# Patient Record
Sex: Female | Born: 1950
Health system: Southern US, Community
[De-identification: ages and names within clinical notes are randomized; demographics above are authoritative.]

## PROBLEM LIST (undated history)

## (undated) DIAGNOSIS — F419 Anxiety disorder, unspecified: Secondary | ICD-10-CM

## (undated) DIAGNOSIS — C7800 Secondary malignant neoplasm of unspecified lung: Secondary | ICD-10-CM

## (undated) DIAGNOSIS — Z720 Tobacco use: Secondary | ICD-10-CM

## (undated) DIAGNOSIS — G8929 Other chronic pain: Secondary | ICD-10-CM

## (undated) DIAGNOSIS — Z972 Presence of dental prosthetic device (complete) (partial): Secondary | ICD-10-CM

## (undated) DIAGNOSIS — C50919 Malignant neoplasm of unspecified site of unspecified female breast: Secondary | ICD-10-CM

## (undated) DIAGNOSIS — IMO0001 Reserved for inherently not codable concepts without codable children: Secondary | ICD-10-CM

## (undated) DIAGNOSIS — R002 Palpitations: Secondary | ICD-10-CM

## (undated) DIAGNOSIS — D649 Anemia, unspecified: Secondary | ICD-10-CM

## (undated) DIAGNOSIS — Q211 Atrial septal defect: Secondary | ICD-10-CM

## (undated) DIAGNOSIS — IMO0002 Reserved for concepts with insufficient information to code with codable children: Secondary | ICD-10-CM

## (undated) DIAGNOSIS — C541 Malignant neoplasm of endometrium: Secondary | ICD-10-CM

## (undated) DIAGNOSIS — J449 Chronic obstructive pulmonary disease, unspecified: Secondary | ICD-10-CM

## (undated) HISTORY — DX: Malignant neoplasm of unspecified site of unspecified female breast: C50.919

## (undated) HISTORY — DX: Reserved for concepts with insufficient information to code with codable children: IMO0002

## (undated) HISTORY — PX: OTHER SURGICAL HISTORY: SHX169

## (undated) HISTORY — PX: CHOLECYSTECTOMY: SHX55

## (undated) HISTORY — DX: Reserved for inherently not codable concepts without codable children: IMO0001

## (undated) HISTORY — DX: Anxiety disorder, unspecified: F41.9

## (undated) HISTORY — PX: TUBAL LIGATION: SHX77

## (undated) HISTORY — PX: BREAST BIOPSY: SHX20

## (undated) HISTORY — DX: Anemia, unspecified: D64.9

---

## 1994-07-31 DIAGNOSIS — Q211 Atrial septal defect, unspecified: Secondary | ICD-10-CM

## 1994-07-31 HISTORY — DX: Atrial septal defect, unspecified: Q21.10

## 1994-07-31 HISTORY — PX: ASD REPAIR, OSTIUM PRIMUM: SHX1194

## 1994-07-31 HISTORY — DX: Atrial septal defect: Q21.1

## 2001-01-11 ENCOUNTER — Emergency Department (HOSPITAL_COMMUNITY): Admission: EM | Admit: 2001-01-11 | Discharge: 2001-01-11 | Payer: Self-pay | Admitting: *Deleted

## 2001-01-11 ENCOUNTER — Encounter: Payer: Self-pay | Admitting: *Deleted

## 2001-01-13 ENCOUNTER — Encounter: Payer: Self-pay | Admitting: Internal Medicine

## 2001-01-13 ENCOUNTER — Emergency Department (HOSPITAL_COMMUNITY): Admission: EM | Admit: 2001-01-13 | Discharge: 2001-01-13 | Payer: Self-pay | Admitting: Internal Medicine

## 2001-08-01 ENCOUNTER — Emergency Department (HOSPITAL_COMMUNITY): Admission: EM | Admit: 2001-08-01 | Discharge: 2001-08-02 | Payer: Self-pay | Admitting: *Deleted

## 2001-09-18 ENCOUNTER — Encounter: Payer: Self-pay | Admitting: Family Medicine

## 2001-09-18 ENCOUNTER — Ambulatory Visit (HOSPITAL_COMMUNITY): Admission: RE | Admit: 2001-09-18 | Discharge: 2001-09-18 | Payer: Self-pay | Admitting: Family Medicine

## 2002-01-01 ENCOUNTER — Emergency Department (HOSPITAL_COMMUNITY): Admission: EM | Admit: 2002-01-01 | Discharge: 2002-01-01 | Payer: Self-pay | Admitting: *Deleted

## 2002-01-01 ENCOUNTER — Encounter: Payer: Self-pay | Admitting: *Deleted

## 2002-01-05 ENCOUNTER — Encounter: Payer: Self-pay | Admitting: Family Medicine

## 2002-01-05 ENCOUNTER — Ambulatory Visit (HOSPITAL_COMMUNITY): Admission: RE | Admit: 2002-01-05 | Discharge: 2002-01-05 | Payer: Self-pay | Admitting: Family Medicine

## 2002-01-17 ENCOUNTER — Ambulatory Visit (HOSPITAL_COMMUNITY): Admission: RE | Admit: 2002-01-17 | Discharge: 2002-01-17 | Payer: Self-pay | Admitting: Cardiology

## 2002-02-08 ENCOUNTER — Emergency Department (HOSPITAL_COMMUNITY): Admission: EM | Admit: 2002-02-08 | Discharge: 2002-02-08 | Payer: Self-pay | Admitting: *Deleted

## 2002-02-08 ENCOUNTER — Encounter: Payer: Self-pay | Admitting: *Deleted

## 2002-11-04 ENCOUNTER — Emergency Department (HOSPITAL_COMMUNITY): Admission: EM | Admit: 2002-11-04 | Discharge: 2002-11-04 | Payer: Self-pay | Admitting: *Deleted

## 2002-11-04 ENCOUNTER — Encounter: Payer: Self-pay | Admitting: *Deleted

## 2003-06-26 ENCOUNTER — Emergency Department (HOSPITAL_COMMUNITY): Admission: EM | Admit: 2003-06-26 | Discharge: 2003-06-26 | Payer: Self-pay | Admitting: *Deleted

## 2003-12-25 ENCOUNTER — Ambulatory Visit (HOSPITAL_COMMUNITY): Admission: RE | Admit: 2003-12-25 | Discharge: 2003-12-25 | Payer: Self-pay | Admitting: Family Medicine

## 2005-03-24 ENCOUNTER — Emergency Department (HOSPITAL_COMMUNITY): Admission: EM | Admit: 2005-03-24 | Discharge: 2005-03-24 | Payer: Self-pay | Admitting: Emergency Medicine

## 2005-08-25 ENCOUNTER — Ambulatory Visit (HOSPITAL_COMMUNITY): Admission: RE | Admit: 2005-08-25 | Discharge: 2005-08-25 | Payer: Self-pay | Admitting: Internal Medicine

## 2005-09-13 ENCOUNTER — Ambulatory Visit (HOSPITAL_COMMUNITY): Admission: RE | Admit: 2005-09-13 | Discharge: 2005-09-13 | Payer: Self-pay | Admitting: Internal Medicine

## 2007-07-08 ENCOUNTER — Emergency Department (HOSPITAL_COMMUNITY): Admission: EM | Admit: 2007-07-08 | Discharge: 2007-07-08 | Payer: Self-pay | Admitting: Emergency Medicine

## 2008-05-04 ENCOUNTER — Emergency Department (HOSPITAL_COMMUNITY): Admission: EM | Admit: 2008-05-04 | Discharge: 2008-05-04 | Payer: Self-pay | Admitting: Emergency Medicine

## 2008-12-13 ENCOUNTER — Emergency Department (HOSPITAL_COMMUNITY): Admission: EM | Admit: 2008-12-13 | Discharge: 2008-12-13 | Payer: Self-pay | Admitting: Emergency Medicine

## 2009-06-12 ENCOUNTER — Emergency Department (HOSPITAL_COMMUNITY): Admission: EM | Admit: 2009-06-12 | Discharge: 2009-06-12 | Payer: Self-pay | Admitting: Emergency Medicine

## 2009-09-22 ENCOUNTER — Emergency Department (HOSPITAL_COMMUNITY): Admission: EM | Admit: 2009-09-22 | Discharge: 2009-09-22 | Payer: Self-pay | Admitting: Emergency Medicine

## 2010-02-17 ENCOUNTER — Emergency Department (HOSPITAL_COMMUNITY): Admission: EM | Admit: 2010-02-17 | Discharge: 2010-02-17 | Payer: Self-pay | Admitting: Emergency Medicine

## 2010-04-07 ENCOUNTER — Emergency Department (HOSPITAL_COMMUNITY): Admission: EM | Admit: 2010-04-07 | Discharge: 2010-04-07 | Payer: Self-pay | Admitting: Emergency Medicine

## 2010-10-19 LAB — CBC
HCT: 39.9 % (ref 36.0–46.0)
MCHC: 31.9 g/dL (ref 30.0–36.0)
MCV: 73.6 fL — ABNORMAL LOW (ref 78.0–100.0)
Platelets: 251 10*3/uL (ref 150–400)
RDW: 14.3 % (ref 11.5–15.5)
WBC: 13.4 10*3/uL — ABNORMAL HIGH (ref 4.0–10.5)

## 2010-10-19 LAB — DIFFERENTIAL
Basophils Absolute: 0.1 10*3/uL (ref 0.0–0.1)
Eosinophils Relative: 2 % (ref 0–5)
Lymphs Abs: 4.5 10*3/uL — ABNORMAL HIGH (ref 0.7–4.0)
Monocytes Absolute: 0.7 10*3/uL (ref 0.1–1.0)
Monocytes Relative: 5 % (ref 3–12)
Neutro Abs: 7.8 10*3/uL — ABNORMAL HIGH (ref 1.7–7.7)

## 2010-10-19 LAB — BASIC METABOLIC PANEL
BUN: 8 mg/dL (ref 6–23)
CO2: 25 mEq/L (ref 19–32)
GFR calc Af Amer: >60 mL/min (ref >60)

## 2010-10-19 LAB — POCT CARDIAC MARKERS
Myoglobin, poc: 71.7 ng/mL (ref 12–200)
Troponin i, poc: <0.05 ng/mL (ref 0.00–0.09)

## 2010-10-31 ENCOUNTER — Other Ambulatory Visit (HOSPITAL_COMMUNITY): Payer: Self-pay | Admitting: *Deleted

## 2010-10-31 DIAGNOSIS — Z1231 Encounter for screening mammogram for malignant neoplasm of breast: Secondary | ICD-10-CM

## 2010-11-02 ENCOUNTER — Ambulatory Visit (HOSPITAL_COMMUNITY)
Admission: RE | Admit: 2010-11-02 | Discharge: 2010-11-02 | Disposition: A | Discharge status: Home / Self Care | Payer: Medicare Other | Source: Ambulatory Visit | Attending: Family Medicine | Admitting: Family Medicine

## 2010-11-02 DIAGNOSIS — Z1231 Encounter for screening mammogram for malignant neoplasm of breast: Secondary | ICD-10-CM | POA: Insufficient documentation

## 2010-11-08 LAB — CBC
MCHC: 32.9 g/dL (ref 30.0–36.0)
RBC: 5.22 MIL/uL — ABNORMAL HIGH (ref 3.87–5.11)

## 2010-11-08 LAB — DIFFERENTIAL
Basophils Absolute: 0.1 10*3/uL (ref 0.0–0.1)
Basophils Relative: 1 % (ref 0–1)
Eosinophils Absolute: 0.2 10*3/uL (ref 0.0–0.7)
Eosinophils Relative: 1 % (ref 0–5)
Lymphocytes Relative: 32 % (ref 12–46)
Monocytes Relative: 5 % (ref 3–12)
Neutrophils Relative %: 61 % (ref 43–77)

## 2010-11-08 LAB — BASIC METABOLIC PANEL
Calcium: 9.3 mg/dL (ref 8.4–10.5)
GFR calc Af Amer: >60 mL/min (ref >60)
Glucose, Bld: 143 mg/dL — ABNORMAL HIGH (ref 70–99)
Potassium: 3.3 mEq/L — ABNORMAL LOW (ref 3.5–5.1)
Sodium: 141 mEq/L (ref 135–145)

## 2010-11-08 LAB — POCT CARDIAC MARKERS
CKMB, poc: <1 ng/mL — ABNORMAL LOW (ref 1.0–8.0)
Myoglobin, poc: 53.6 ng/mL (ref 12–200)

## 2010-12-16 NOTE — Procedures (Signed)
[facility]  Patient:    Paula Navarro, Paula Navarro Visit Number: [id number] MRN: [id number]          Service Type: OUT Location: RAD Attending Physician:  Cain Sieve Dictated by:   Gravity Bing, M.D. Proc. Date: 01/17/02 Admit Date:  01/17/2002 Discharge Date: 01/17/2002                              Echocardiograms  REFERRING:  Butch Penny, M.D. and Latimer Bing, M.D.  CLINICAL DATA:  A 60 year old woman with chest pain and prior ASD repair.  1. Technically adequate echocardiographic study. 2. Normal left atrium, right atrium, and right ventricle. 3. Normal aortic, mitral, tricuspid, and pulmonic valves. 4. Doppler study demonstrated mild mitral regurgitation. 5. Normal internal dimension of the left ventricle, mild LVH.  Normal regional    and global LV systolic function. 6. Normal IVC. 7. Atrial septum appears intact.  No left to right nor right to left flow    demonstrated with Doppler. Dictated by:   Eastman Bing, M.D. Attending Physician:  Cain Sieve DD:  01/17/02 TD:  01/19/02 Job: [id number]

## 2010-12-16 NOTE — Procedures (Signed)
   NAME:  Paula Navarro, Paula Navarro                           ACCOUNT NO.:  [id number]   MEDICAL RECORD NO.:  [id number]                   PATIENT TYPE:  EMS   LOCATION:  ED                                   FACILITY:  [facility]   PHYSICIAN:  Edward L. Juanetta Gosling, M.D.             DATE OF BIRTH:  1951/06/02   DATE OF PROCEDURE:  11/04/2002  DATE OF DISCHARGE:  11/04/2002                                EKG INTERPRETATION   DATE AND TIME OF TEST:  November 04, 2002 at 2035.   FINDINGS:  The rhythm is sinus rhythm with rate of about 100.  There is  atrial enlargement which appears to be right atrial.  Poor R wave  progression across the precordium may indicate a previous anterior  myocardial infarction; clinical correlation is suggested.   IMPRESSION:  Abnormal electrocardiogram.                                               Edward L. Juanetta Gosling, M.D.    ELH/MEDQ  D:  11/05/2002  T:  11/06/2002  Job:  [id number]

## 2011-03-24 ENCOUNTER — Encounter: Payer: Self-pay | Admitting: *Deleted

## 2011-03-24 ENCOUNTER — Emergency Department (HOSPITAL_COMMUNITY)
Admission: EM | Admit: 2011-03-24 | Discharge: 2011-03-24 | Disposition: A | Discharge status: Home / Self Care | Payer: Medicare Other | Attending: Emergency Medicine | Admitting: Emergency Medicine

## 2011-03-24 DIAGNOSIS — R05 Cough: Secondary | ICD-10-CM | POA: Insufficient documentation

## 2011-03-24 DIAGNOSIS — R059 Cough, unspecified: Secondary | ICD-10-CM | POA: Insufficient documentation

## 2011-03-24 DIAGNOSIS — J069 Acute upper respiratory infection, unspecified: Secondary | ICD-10-CM | POA: Insufficient documentation

## 2011-03-24 DIAGNOSIS — F17200 Nicotine dependence, unspecified, uncomplicated: Secondary | ICD-10-CM | POA: Insufficient documentation

## 2011-03-24 MED ORDER — ALBUTEROL SULFATE (5 MG/ML) 0.5% IN NEBU
2.5000 mg | INHALATION_SOLUTION | Freq: Once | RESPIRATORY_TRACT | Status: AC
  Administered 2011-03-24: 2.5 mg via RESPIRATORY_TRACT
  Filled 2011-03-24: qty 0.5

## 2011-03-24 MED ORDER — AZITHROMYCIN 250 MG PO TABS: ORAL_TABLET | ORAL | Status: DC

## 2011-03-24 MED ORDER — ALBUTEROL SULFATE HFA 108 (90 BASE) MCG/ACT IN AERS: 2.0000 | INHALATION_SPRAY | RESPIRATORY_TRACT | Status: DC | PRN

## 2011-03-24 NOTE — ED Notes (Signed)
Pt c/o cough, congestion, left neck pain that is worse with coughing or movement. Pt also c/o burning in her chest and throat when she coughs. C/o nonproductive dry cough.

## 2011-03-24 NOTE — ED Notes (Signed)
Pt a/ox4. Resp even and unlabored. NAD at this time. D/C instructions reviewed with pt. Pt verbalized understanding. Pt ambulated with steady gate to POV. 

## 2011-03-24 NOTE — ED Provider Notes (Signed)
History     CSN: [id number] Arrival date & time: 03/24/2011 11:05 AM  Chief Complaint  Patient presents with  . Nasal Congestion   Patient is a 60 y.o. female presenting with URI. The history is provided by the patient.  URI The primary symptoms include cough and wheezing. Primary symptoms do not include fever, fatigue, headaches, ear pain, sore throat, abdominal pain, nausea, vomiting, myalgias or rash. The current episode started 3 to 5 days ago. This is a recurrent problem. The problem has been gradually worsening.  The cough began today. The cough is non-productive.  Wheezing began today. The wheezing has been unchanged since its onset. The patient's medical history is significant for COPD and chronic lung disease.  Symptoms associated with the illness include congestion and rhinorrhea. The illness is not associated with chills, facial pain or sinus pressure. The following treatments were addressed: A decongestant was ineffective.    History reviewed. No pertinent past medical history.  Past Surgical History  Procedure Date  . Cardiac surgery     Pt states that she had a hole in her heart that didn't close right.   . Cholecystectomy     History reviewed. No pertinent family history.  History  Substance Use Topics  . Smoking status: Current Everyday Smoker    Types: Cigarettes  . Smokeless tobacco: Not on file  . Alcohol Use: No    OB History    Grav Para Term Preterm Abortions TAB SAB Ect Mult Living                  Review of Systems  Constitutional: Negative for fever, chills, activity change and fatigue.  HENT: Positive for congestion, rhinorrhea and sneezing. Negative for ear pain, sore throat, facial swelling, trouble swallowing, neck pain, neck stiffness and sinus pressure.   Respiratory: Positive for cough and wheezing. Negative for chest tightness and shortness of breath.   Cardiovascular: Negative.   Gastrointestinal: Negative for nausea, vomiting and  abdominal pain.  Musculoskeletal: Negative for myalgias.  Skin: Negative for rash.  Neurological: Negative for headaches.  All other systems reviewed and are negative.    Physical Exam  BP 122/77  Pulse 85  Temp(Src) 98 F (36.7 C) (Oral)  Resp 18  Ht 5\' 4"  (1.626 m)  Wt 136 lb (61.689 kg)  BMI 23.34 kg/m2  SpO2 99%  Physical Exam  Nursing note and vitals reviewed. Constitutional: She is oriented to person, place, and time. She appears well-developed and well-nourished. No distress.  HENT:  Head: Normocephalic and atraumatic.  Right Ear: External ear normal.  Left Ear: External ear normal.  Mouth/Throat: Oropharynx is clear and moist.  Eyes: EOM are normal.  Neck: Normal range of motion. Neck supple. No thyromegaly present.  Cardiovascular: Normal rate, regular rhythm and normal heart sounds.   Pulmonary/Chest: No accessory muscle usage. No respiratory distress. She has wheezes. She has no rales. She exhibits no tenderness.  Abdominal: Soft. There is no tenderness.  Musculoskeletal: She exhibits no edema and no tenderness.  Lymphadenopathy:    She has no cervical adenopathy.  Neurological: She is alert and oriented to person, place, and time. She exhibits normal muscle tone. Coordination normal.  Skin: Skin is warm and dry.    ED Course  Procedures  MDM   1259  Patient is ambulatory, NAD.  Expiratory wheezing throughout.  Vitals stable.   1340  Patient feels better, lungs sounds improved after neb.  Vitals and nursing notes werre reviewed,  Pt feels improved after observation and/or treatment in ED.   The patient appears reasonably screened and/or stabilized for discharge and I doubt any other medical condition or other [facility] requiring further screening, evaluation, or treatment in the ED at this time prior to discharge.       Moorea Boissonneault L. [place], [place] 03/30/11 1651

## 2011-03-24 NOTE — ED Notes (Signed)
RT notified of order for breathing treatment

## 2011-04-05 NOTE — ED Provider Notes (Signed)
Medical screening examination/treatment/procedure(s) were conducted as a shared visit with non-physician practitioner(s) and myself.  I personally evaluated the patient during the encounter  Toy Baker, MD 04/05/11 2332

## 2011-05-25 ENCOUNTER — Emergency Department (HOSPITAL_COMMUNITY)
Admission: EM | Admit: 2011-05-25 | Discharge: 2011-05-25 | Disposition: A | Discharge status: Home / Self Care | Payer: Medicare Other | Attending: Emergency Medicine | Admitting: Emergency Medicine

## 2011-05-25 ENCOUNTER — Encounter (HOSPITAL_COMMUNITY): Payer: Self-pay | Admitting: *Deleted

## 2011-05-25 DIAGNOSIS — J40 Bronchitis, not specified as acute or chronic: Secondary | ICD-10-CM

## 2011-05-25 DIAGNOSIS — F17200 Nicotine dependence, unspecified, uncomplicated: Secondary | ICD-10-CM | POA: Insufficient documentation

## 2011-05-25 DIAGNOSIS — R062 Wheezing: Secondary | ICD-10-CM | POA: Insufficient documentation

## 2011-05-25 DIAGNOSIS — R0602 Shortness of breath: Secondary | ICD-10-CM | POA: Insufficient documentation

## 2011-05-25 DIAGNOSIS — M545 Low back pain, unspecified: Secondary | ICD-10-CM | POA: Insufficient documentation

## 2011-05-25 DIAGNOSIS — Z79899 Other long term (current) drug therapy: Secondary | ICD-10-CM | POA: Insufficient documentation

## 2011-05-25 MED ORDER — ALBUTEROL SULFATE HFA 108 (90 BASE) MCG/ACT IN AERS: 2.0000 | INHALATION_SPRAY | RESPIRATORY_TRACT | Status: DC

## 2011-05-25 MED ORDER — HYDROCOD POLST-CHLORPHEN POLST 10-8 MG/5ML PO LQCR
5.0000 mL | Freq: Once | ORAL | Status: DC
  Filled 2011-05-25: qty 5

## 2011-05-25 MED ORDER — PREDNISONE 20 MG PO TABS
60.0000 mg | ORAL_TABLET | Freq: Every day | ORAL | Status: DC
  Administered 2011-05-25: 60 mg via ORAL
  Filled 2011-05-25: qty 3

## 2011-05-25 MED ORDER — PREDNISONE 10 MG PO TABS: ORAL_TABLET | ORAL | Status: DC

## 2011-05-25 MED ORDER — ALBUTEROL SULFATE HFA 108 (90 BASE) MCG/ACT IN AERS
INHALATION_SPRAY | RESPIRATORY_TRACT | Status: AC
  Filled 2011-05-25: qty 6.7
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
## 2011-05-26 NOTE — ED Provider Notes (Signed)
Evaluation and management procedures were performed by the PA/NP under my supervision/collaboration.    Felisa Bonier, MD 05/26/11 2122

## 2011-08-22 ENCOUNTER — Encounter (HOSPITAL_COMMUNITY): Payer: Self-pay | Admitting: Emergency Medicine

## 2011-08-22 ENCOUNTER — Emergency Department (HOSPITAL_COMMUNITY)
Admission: EM | Admit: 2011-08-22 | Discharge: 2011-08-22 | Disposition: A | Discharge status: Home / Self Care | Payer: Medicare Other | Attending: Emergency Medicine | Admitting: Emergency Medicine

## 2011-08-22 DIAGNOSIS — J40 Bronchitis, not specified as acute or chronic: Secondary | ICD-10-CM | POA: Insufficient documentation

## 2011-08-22 DIAGNOSIS — F17200 Nicotine dependence, unspecified, uncomplicated: Secondary | ICD-10-CM | POA: Insufficient documentation

## 2011-08-22 MED ORDER — METHYLPREDNISOLONE SODIUM SUCC 125 MG IJ SOLR
125.0000 mg | Freq: Once | INTRAMUSCULAR | Status: AC
  Administered 2011-08-22: 125 mg via INTRAMUSCULAR
  Filled 2011-08-22: qty 2

## 2011-08-22 MED ORDER — AZITHROMYCIN 250 MG PO TABS: 250.0000 mg | ORAL_TABLET | Freq: Every day | ORAL | Status: DC

## 2011-08-22 MED ORDER — PROMETHAZINE-DM 6.25-15 MG/5ML PO SYRP: 5.0000 mL | ORAL_SOLUTION | ORAL | Status: AC | PRN

## 2011-08-22 MED ORDER — IPRATROPIUM BROMIDE 0.02 % IN SOLN
0.5000 mg | Freq: Once | RESPIRATORY_TRACT | Status: AC
  Administered 2011-08-22: 0.5 mg via RESPIRATORY_TRACT
  Filled 2011-08-22: qty 2.5

## 2011-08-22 MED ORDER — AZITHROMYCIN 250 MG PO TABS
500.0000 mg | ORAL_TABLET | Freq: Once | ORAL | Status: AC
  Administered 2011-08-22: 500 mg via ORAL
  Filled 2011-08-22: qty 2

## 2011-08-22 MED ORDER — AZITHROMYCIN 250 MG PO TABS: ORAL_TABLET | ORAL | Status: DC

## 2011-08-22 MED ORDER — ALBUTEROL SULFATE (5 MG/ML) 0.5% IN NEBU
2.5000 mg | INHALATION_SOLUTION | Freq: Once | RESPIRATORY_TRACT | Status: AC
  Administered 2011-08-22: 2.5 mg via RESPIRATORY_TRACT
  Filled 2011-08-22: qty 0.5

## 2011-08-22 NOTE — ED Notes (Signed)
Sprite given at patient's request. 

## 2011-08-22 NOTE — ED Notes (Signed)
Pt c/o coughing and sob since Thursday.

## 2011-08-22 NOTE — ED Notes (Signed)
RT paged for breathing tx

## 2011-08-22 NOTE — ED Provider Notes (Signed)
History     CSN: [id number]  Arrival date & time 08/22/11  4098   First MD Initiated Contact with Patient 08/22/11 [id number]      Chief Complaint  Patient presents with  . Cough  . Shortness of Breath    (Consider location/radiation/quality/duration/timing/severity/associated sxs/prior treatment) Patient is a 61 y.o. female presenting with cough and shortness of breath. The history is provided by the patient.  Cough The current episode started more than 2 days ago. The problem occurs hourly. The problem has not changed since onset.The cough is productive of sputum. There has been no fever. Associated symptoms include chills, sweats, myalgias and shortness of breath. Pertinent negatives include no chest pain and no wheezing. She has tried cough syrup for the symptoms. The treatment provided no relief. She is a smoker. Her past medical history is significant for bronchitis.  Shortness of Breath  Associated symptoms include cough and shortness of breath. Pertinent negatives include no chest pain and no wheezing.    Past Medical History  Diagnosis Date  . Bronchitis     Past Surgical History  Procedure Date  . Cardiac surgery     Pt states that she had a hole in her heart that didn't close right.   . Cholecystectomy     No family history on file.  History  Substance Use Topics  . Smoking status: Current Everyday Smoker    Types: Cigarettes  . Smokeless tobacco: Not on file  . Alcohol Use: No    OB History    Grav Para Term Preterm Abortions TAB SAB Ect Mult Living                  Review of Systems  Constitutional: Positive for chills. Negative for activity change.       All ROS Neg except as noted in HPI  HENT: Negative for nosebleeds and neck pain.   Eyes: Negative for photophobia and discharge.  Respiratory: Positive for cough and shortness of breath. Negative for wheezing.   Cardiovascular: Negative for chest pain and palpitations.  Gastrointestinal: Negative for  abdominal pain and blood in stool.  Genitourinary: Negative for dysuria, frequency and hematuria.  Musculoskeletal: Positive for myalgias. Negative for back pain and arthralgias.  Skin: Negative.   Neurological: Negative for dizziness, seizures and speech difficulty.  Psychiatric/Behavioral: Negative for hallucinations and confusion.    Allergies  Codeine  Home Medications   Current Outpatient Rx  Name Route Sig Dispense Refill  . ALBUTEROL SULFATE HFA 108 (90 BASE) MCG/ACT IN AERS Inhalation Inhale 2 puffs into the lungs every 4 (four) hours as needed for wheezing. 1 Inhaler 0  . HYDROCODONE-HOMATROPINE 5-1.5 MG/5ML PO SYRP Oral Take 5 mLs by mouth daily as needed. For cough (Bronchitis)    . PREDNISONE 10 MG PO TABS Oral Take 10 mg by mouth daily. 6,5,4,3,2,1 - take with food      BP 148/73  Pulse 66  Temp(Src) 97.8 F (36.6 C) (Oral)  Resp 18  Ht 5\' 4"  (1.626 m)  Wt 132 lb (59.875 kg)  BMI 22.66 kg/m2  SpO2 95%  LMP 05/20/2011  Physical Exam  Nursing note and vitals reviewed. Constitutional: She is oriented to person, place, and time. She appears well-developed and well-nourished.  Non-toxic appearance.  HENT:  Head: Normocephalic.  Right Ear: Tympanic membrane and external ear normal.  Left Ear: Tympanic membrane and external ear normal.       Moderate nasal congestion.  Eyes: EOM and lids are normal.  Pupils are equal, round, and reactive to light.  Neck: Normal range of motion. Neck supple. Carotid bruit is not present.  Cardiovascular: Normal rate, regular rhythm, normal heart sounds, intact distal pulses and normal pulses.   Pulmonary/Chest: No respiratory distress. She has wheezes. She has rhonchi.  Abdominal: Soft. Bowel sounds are normal. There is no tenderness. There is no guarding.  Musculoskeletal: Normal range of motion.  Lymphadenopathy:       Head (right side): No submandibular adenopathy present.       Head (left side): No submandibular adenopathy  present.    She has no cervical adenopathy.  Neurological: She is alert and oriented to person, place, and time. She has normal strength. No cranial nerve deficit or sensory deficit.  Skin: Skin is warm and dry.  Psychiatric: She has a normal mood and affect. Her speech is normal.    ED Course  Procedures (including critical care time)  Labs Reviewed - No data to display No results found.   Dx: Bronchitis.    MDM  I have reviewed nursing notes, vital signs, and all appropriate lab and imaging results for this patient. Plan discussed with pt. Need to take medications as suggested and stop smoking discussed. Albuterol neb tx, Zithromax, and solumedrol given in ED. Rx for zithromax and promethazine DM given. Pt has prednisone and albuterol inhaler.       Kathie Dike, [place] 08/22/11 1022

## 2011-08-22 NOTE — ED Notes (Signed)
Pt currently receiving breathing tx

## 2011-08-23 NOTE — ED Provider Notes (Signed)
Medical screening examination/treatment/procedure(s) were performed by non-physician practitioner and as supervising physician I was immediately available for consultation/collaboration.    Tamarcus Condie L Jerilee Space, MD 08/23/11 0708 

## 2011-09-30 ENCOUNTER — Encounter (HOSPITAL_COMMUNITY): Payer: Self-pay | Admitting: *Deleted

## 2011-09-30 ENCOUNTER — Emergency Department (HOSPITAL_COMMUNITY)
Admission: EM | Admit: 2011-09-30 | Discharge: 2011-09-30 | Discharge status: Against Medical Advice | Payer: Medicare Other | Attending: Emergency Medicine | Admitting: Emergency Medicine

## 2011-09-30 DIAGNOSIS — H9319 Tinnitus, unspecified ear: Secondary | ICD-10-CM | POA: Insufficient documentation

## 2011-09-30 NOTE — ED Notes (Addendum)
Pt presents with bilateral ear ringing. X 2 days. No drainage noted. NAD at this time. Will continue to monitor.

## 2011-09-30 NOTE — ED Notes (Signed)
Pt states ringing in ears since last night. NAD.

## 2011-09-30 NOTE — ED Notes (Signed)
Pt LWBS. Pt refused to sign AMA but stated "I have to take care of my momma. I cant be up here all day." Pt then ambulated to registration desk and told staff members the same thing and then walked out front door.

## 2011-10-12 ENCOUNTER — Other Ambulatory Visit (HOSPITAL_COMMUNITY): Payer: Self-pay | Admitting: Family Medicine

## 2011-10-12 DIAGNOSIS — Z139 Encounter for screening, unspecified: Secondary | ICD-10-CM

## 2011-10-30 ENCOUNTER — Observation Stay (HOSPITAL_COMMUNITY)
Admission: EM | Admit: 2011-10-30 | Discharge: 2011-11-01 | Disposition: A | Discharge status: Home / Self Care | Payer: Medicare Other | Attending: Family Medicine | Admitting: Family Medicine

## 2011-10-30 ENCOUNTER — Encounter (HOSPITAL_COMMUNITY): Payer: Self-pay | Admitting: *Deleted

## 2011-10-30 ENCOUNTER — Other Ambulatory Visit: Payer: Self-pay

## 2011-10-30 ENCOUNTER — Emergency Department (HOSPITAL_COMMUNITY): Payer: Medicare Other

## 2011-10-30 DIAGNOSIS — Q211 Atrial septal defect: Secondary | ICD-10-CM

## 2011-10-30 DIAGNOSIS — Z79899 Other long term (current) drug therapy: Secondary | ICD-10-CM | POA: Insufficient documentation

## 2011-10-30 DIAGNOSIS — N6320 Unspecified lump in the left breast, unspecified quadrant: Secondary | ICD-10-CM

## 2011-10-30 DIAGNOSIS — R0602 Shortness of breath: Secondary | ICD-10-CM | POA: Insufficient documentation

## 2011-10-30 DIAGNOSIS — I2510 Atherosclerotic heart disease of native coronary artery without angina pectoris: Secondary | ICD-10-CM | POA: Diagnosis present

## 2011-10-30 DIAGNOSIS — J40 Bronchitis, not specified as acute or chronic: Secondary | ICD-10-CM

## 2011-10-30 DIAGNOSIS — F17200 Nicotine dependence, unspecified, uncomplicated: Secondary | ICD-10-CM | POA: Insufficient documentation

## 2011-10-30 DIAGNOSIS — J209 Acute bronchitis, unspecified: Secondary | ICD-10-CM | POA: Diagnosis present

## 2011-10-30 DIAGNOSIS — R079 Chest pain, unspecified: Principal | ICD-10-CM | POA: Diagnosis present

## 2011-10-30 HISTORY — DX: Atrial septal defect: Q21.1

## 2011-10-30 HISTORY — DX: Palpitations: R00.2

## 2011-10-30 HISTORY — DX: Tobacco use: Z72.0

## 2011-10-30 LAB — DIFFERENTIAL
Basophils Absolute: 0.1 10*3/uL (ref 0.0–0.1)
Basophils Relative: 0 % (ref 0–1)
Eosinophils Relative: 2 % (ref 0–5)
Lymphocytes Relative: 36 % (ref 12–46)
Monocytes Absolute: 0.5 10*3/uL (ref 0.1–1.0)
Neutro Abs: 6.9 10*3/uL (ref 1.7–7.7)

## 2011-10-30 LAB — CBC
MCHC: 32.6 g/dL (ref 30.0–36.0)
MCV: 70.9 fL — ABNORMAL LOW (ref 78.0–100.0)
Platelets: 258 10*3/uL (ref 150–400)
RDW: 15.2 % (ref 11.5–15.5)
WBC: 12.1 10*3/uL — ABNORMAL HIGH (ref 4.0–10.5)

## 2011-10-30 LAB — BASIC METABOLIC PANEL
CO2: 29 mEq/L (ref 19–32)
Calcium: 10.1 mg/dL (ref 8.4–10.5)
Creatinine, Ser: 0.76 mg/dL (ref 0.50–1.10)
GFR calc Af Amer: >90 mL/min (ref >90)
Sodium: 140 mEq/L (ref 135–145)

## 2011-10-30 LAB — TROPONIN I: Troponin I: <0.3 ng/mL (ref <0.30)

## 2011-10-30 MED ORDER — BENZONATATE 100 MG PO CAPS
100.0000 mg | ORAL_CAPSULE | Freq: Three times a day (TID) | ORAL | Status: DC | PRN
  Administered 2011-10-30 – 2011-10-31 (×2): 100 mg via ORAL
  Filled 2011-10-30 (×2): qty 1

## 2011-10-30 MED ORDER — SODIUM CHLORIDE 0.9 % IV SOLN: INTRAVENOUS | Status: AC

## 2011-10-30 MED ORDER — ASPIRIN 81 MG PO CHEW
324.0000 mg | CHEWABLE_TABLET | Freq: Once | ORAL | Status: AC
  Administered 2011-10-30: 324 mg via ORAL
  Filled 2011-10-30: qty 4

## 2011-10-30 MED ORDER — LEVOFLOXACIN 500 MG PO TABS
750.0000 mg | ORAL_TABLET | Freq: Every day | ORAL | Status: DC
  Administered 2011-10-31 – 2011-11-01 (×2): 750 mg via ORAL
  Filled 2011-10-30 (×2): qty 1

## 2011-10-30 MED ORDER — LEVALBUTEROL TARTRATE 45 MCG/ACT IN AERO
1.0000 | INHALATION_SPRAY | Freq: Four times a day (QID) | RESPIRATORY_TRACT | Status: DC | PRN
  Filled 2011-10-30: qty 15

## 2011-10-30 MED ORDER — LEVOFLOXACIN 500 MG PO TABS
750.0000 mg | ORAL_TABLET | Freq: Once | ORAL | Status: AC
  Administered 2011-10-30: 750 mg via ORAL
  Filled 2011-10-30: qty 1

## 2011-10-30 MED ORDER — ACETAMINOPHEN 500 MG PO TABS: 500.0000 mg | ORAL_TABLET | ORAL | Status: DC | PRN

## 2011-10-30 MED ORDER — LEVALBUTEROL TARTRATE 45 MCG/ACT IN AERO
2.0000 | INHALATION_SPRAY | Freq: Four times a day (QID) | RESPIRATORY_TRACT | Status: DC | PRN
  Filled 2011-10-30: qty 15

## 2011-10-30 NOTE — ED Notes (Addendum)
Pt presents to er with mid center sharp chest pain that occurred after she has having a coughing "spell" pt states that she has been sick with congestion, cough since oct 25,  has been seen in er for several times and by pcp without improvement, pt states that she has been sob and experiencing generalized weakness since she has been sick. Pt arrives to er pain free, pt states that the cough is productive with sputum that changed color from yellow to white. Pt did use her inhaler prior to coming to er with improvement in sob but does state that the inhaler makes her heart feel like it is beating "funny"

## 2011-10-30 NOTE — H&P (Signed)
NAMEGRACIN, Paula Navarro                 ACCOUNT NO.:  [id number]  MEDICAL RECORD NO.:  [id number]  LOCATION:  [facility]                         FACILITY:  [facility]  PHYSICIAN:  Mila Homer. Sudie Bailey, M.D.DATE OF BIRTH:  October 05, 1950  DATE OF ADMISSION:  10/30/2011 DATE OF DISCHARGE:  LH                             HISTORY & PHYSICAL   This 61 year old woman has had bouts of coughing off and on over the last 6 months.  She presents to the emergency room today because she developed fairly severe anterior chest pain for about 30 minutes, which came after another bout of coughing.  She does give a history of having cardiac surgery done by Dr. Tyrone Sage at [facility] in 1996, and says that she had a "hole in the heart."  She also had palpitations at that time, and thinks that 1 of the reasons for the surgery was the palpitations.  She is followed by Dr. Renard Matter her local medical doctor.  She has been on albuterol in the past, but gets palpitations with this.  She lives with her mother, who is now age 59.  She smokes 1-1/2 packs of cigarettes a day and has smoked for years.  She has had no GI or GU symptoms.  Really her major problem has been coughing with some shortness of breath and then the chest pain as mentioned, but with no radiation.  PHYSICAL EXAM:  Exam in her room showed a pleasant, middle-aged woman who is in no acute distress.  Her pain has cleared.  Temp is 97.6, pulse 61, respiratory rate 20, blood pressure 135/85.  Her speech was normal. Sentence structure intact.  She is able to walk in the room without shortness of breath, but did have coughing. LUNGS:  Appeared to be clear throughout, but there were decreased breath sounds. HEART:  Her heart had a regular rhythm, rate of about 80, without murmurs.  There was a surgical scar in the anterior chest. ABDOMEN:  Soft without organomegaly, mass or tenderness. EXTREMITIES:  She had no edema of the ankles.  Her admission  white cell count was 12,100, of which 57% were neutrophils, 36 lymphs.  Hemoglobin was 13.4.  BMP was normal except for glucose mildly elevated at 118 and troponin was 0.30.  Chest x-ray showed chronic lung markings and slight tortuosity of the aorta.  Twelve lead EKG showed poor R-wave progression across the precordium.  She is in a sinus rhythm.  ADMISSION DIAGNOSES: 1. Bronchitis. 2. Chest pain, which could be of a number of causes. 3. Tobacco use disorder. 4. Status post cardiac surgery. She is being admitted to the hospital.  I have not given her a nicotine patch due to the possibility she has coronary disease causing the chest pain.  She is on levofloxacin 750 mg daily with Xopenex 1 puff q.6 hours to see if this helps with coughing.  Since there is a question of hydrocodone sensitivity I am putting her on Tessalon Perles.  She will be seen by her LMD, Dr. Renard Matter, tomorrow and by Cardiology as well.  I have emphasized to her that I think the most important thing to be done this admission is  to stop cigarette smoking for once and for all. We discussed psychological versus physical addiction, the use of a patch, the use of Chantix, psychological techniques to help her get off the cigarettes, etc.     Mila Homer. Sudie Bailey, M.D.     SDK/MEDQ  D:  10/30/2011  T:  10/30/2011  Job:  [id number]

## 2011-10-30 NOTE — ED Notes (Signed)
Pt refusing iv, states that she is unsure if she will stay and  Be admitted or not, advised that she would let me know when her family gets here.

## 2011-10-30 NOTE — ED Notes (Signed)
Pt requesting to wait until she is seen by EDP before having iv and blood work done.

## 2011-10-30 NOTE — ED Notes (Signed)
Pt thinks that she will stay now,

## 2011-10-30 NOTE — ED Provider Notes (Signed)
History   This chart was scribed for Paula Quarry, MD by Brooks Sailors. The patient was seen in room APA02/APA02. Patient's care was started at 1144.   CSN: [id number]  Arrival date & time 10/30/11  1144   First MD Initiated Contact with Patient 10/30/11 1335      Chief Complaint  Patient presents with  . Cough  . Chest Pain    (Consider location/radiation/quality/duration/timing/severity/associated sxs/prior treatment) HPI  LORRAINA Navarro is a 61 y.o. female who presents to the Emergency Department complaining of constant moderate productive cough and episode of chest pain onset this morning with associated SOB. The episode of pain was described as sharp and lasted about thirty minutes localized to the center of the chest, and is aggravated by coughing and that the pain did not radiate anywhere. Patient is a smoker with a history of cardiac surgery. Denies fever, family history of heart problems.     Past Medical History  Diagnosis Date  . Bronchitis   . Palpitation     Past Surgical History  Procedure Date  . Cardiac surgery     Pt states that she had a hole in her heart that didn't close right.   . Cholecystectomy     No family history on file.  History  Substance Use Topics  . Smoking status: Current Everyday Smoker    Types: Cigarettes  . Smokeless tobacco: Not on file  . Alcohol Use: No    OB History    Grav Para Term Preterm Abortions TAB SAB Ect Mult Living                  Review of Systems A complete 10 system review of systems was obtained and all systems are negative except as noted in the HPI and PMH.   Allergies  Codeine  Home Medications   Current Outpatient Rx  Name Route Sig Dispense Refill  . ALBUTEROL SULFATE HFA 108 (90 BASE) MCG/ACT IN AERS Inhalation Inhale 2 puffs into the lungs every 4 (four) hours as needed for wheezing. 1 Inhaler 0  . AZITHROMYCIN 250 MG PO TABS  Take  1 every day until finished, please start Jan 22. 4 tablet 0   . HYDROCODONE-HOMATROPINE 5-1.5 MG/5ML PO SYRP Oral Take 5 mLs by mouth daily as needed. For cough (Bronchitis)    . PREDNISONE 10 MG PO TABS Oral Take 10 mg by mouth daily. 6,5,4,3,2,1 - take with food      BP 146/79  Pulse 79  Temp 98.8 F (37.1 C)  Resp 20  Ht 5\' 4"  (1.626 m)  Wt 137 lb 5 oz (62.285 kg)  BMI 23.57 kg/m2  SpO2 98%  LMP 05/20/2011  Physical Exam  Nursing note and vitals reviewed. Constitutional: She is oriented to person, place, and time. She appears well-developed and well-nourished. No distress.  HENT:  Head: Normocephalic and atraumatic.  Eyes: EOM are normal. Pupils are equal, round, and reactive to light.  Neck: Neck supple. No tracheal deviation present.  Cardiovascular: Normal rate.   Pulmonary/Chest: Effort normal and breath sounds normal. No respiratory distress. Wheezes: mild expiratory        Coarse rhonchi bilaterally. Sternal scar.  Abdominal: Soft. She exhibits no distension.       Cesarean scar. LUQ scar consistent with cholecystectomy.   Musculoskeletal: Normal range of motion. She exhibits no edema.  Neurological: She is alert and oriented to person, place, and time. No sensory deficit.  Skin: Skin is warm  and dry.  Psychiatric: She has a normal mood and affect. Her behavior is normal.    ED Course  Procedures (including critical care time) DIAGNOSTIC STUDIES: Oxygen Saturation is 98% on room air, normal by my interpretation.    COORDINATION OF CARE: 2:04PM- Patient informed of current plan for treatment and evaluation and agrees with plan at this time.      Labs Reviewed  CBC  DIFFERENTIAL  BASIC METABOLIC PANEL   Results for orders placed during the hospital encounter of 10/30/11  CBC      Component Value Range   WBC 12.1 (*) 4.0 - 10.5 (K/uL)   RBC 5.80 (*) 3.87 - 5.11 (MIL/uL)   Hemoglobin 13.4  12.0 - 15.0 (g/dL)   HCT 16.1  09.6 - 04.5 (%)   MCV 70.9 (*) 78.0 - 100.0 (fL)   MCH 23.1 (*) 26.0 - 34.0 (pg)   MCHC 32.6   30.0 - 36.0 (g/dL)   RDW 40.9  81.1 - 91.4 (%)   Platelets 258  150 - 400 (K/uL)  DIFFERENTIAL      Component Value Range   Neutrophils Relative 57  43 - 77 (%)   Neutro Abs 6.9  1.7 - 7.7 (K/uL)   Lymphocytes Relative 36  12 - 46 (%)   Lymphs Abs 4.3 (*) 0.7 - 4.0 (K/uL)   Monocytes Relative 4  3 - 12 (%)   Monocytes Absolute 0.5  0.1 - 1.0 (K/uL)   Eosinophils Relative 2  0 - 5 (%)   Eosinophils Absolute 0.3  0.0 - 0.7 (K/uL)   Basophils Relative 0  0 - 1 (%)   Basophils Absolute 0.1  0.0 - 0.1 (K/uL)    Dg Chest 2 View  10/30/2011  *RADIOLOGY REPORT*  Clinical Data: Chest pain.  Cough.  Smoker.  Prior surgery.  CHEST - 2 VIEW  Comparison: 02/17/2010 and 05/04/2008.  Findings: Chronic lung markings unchanged.  Prominence of the right hilum stable.  Slight tortuosity of the aorta.  No infiltrate, congestive heart failure or pneumothorax.  Heart size top normal.  On the lateral view, increased markings retrocardiac region probably vascular in origin.  If there is a persistent unexplained cough in this smoker, follow- up imaging may be considered.  IMPRESSION: No acute abnormality.  Please see above.  Original Report Authenticated By: Fuller Canada, M.D.     No diagnosis found.     Date: 10/30/2011  Rate: 75  Rhythm: normal sinus rhythm  QRS Axis: right  Intervals: normal  ST/T Wave abnormalities: anterior q waves  Conduction Disutrbances:none  Narrative Interpretation:   Old EKG Reviewed: none available   Patient pain free here but unclear etiology of chest pain.  Cough consistent with bronchitis.  I feel patient needs further assessment of her chest pain. I discussed this with the patient she is agreeable to staying for rule out workup is ongoing assessment of her cardiac enzymes. She has been seen by what our cardiology in the past.     I personally performed the services described in this documentation, which was scribed in my presence. The recorded information has been  reviewed and considered.   Paula Quarry, MD 10/31/11 0800

## 2011-10-30 NOTE — ED Notes (Signed)
See triage note.

## 2011-10-30 NOTE — ED Notes (Signed)
Pt willing to have blood work done now, pt updated on plan of care. Ambulatory to restroom, tolerated well

## 2011-10-30 NOTE — ED Notes (Signed)
Report given to Miranda, RN.

## 2011-10-30 NOTE — ED Notes (Signed)
Pt updated on plan of care

## 2011-10-30 NOTE — ED Notes (Signed)
MD at bedside. 

## 2011-10-31 ENCOUNTER — Encounter (HOSPITAL_COMMUNITY): Payer: Self-pay | Admitting: Cardiology

## 2011-10-31 DIAGNOSIS — Z720 Tobacco use: Secondary | ICD-10-CM | POA: Insufficient documentation

## 2011-10-31 DIAGNOSIS — Q211 Atrial septal defect: Secondary | ICD-10-CM

## 2011-10-31 DIAGNOSIS — R079 Chest pain, unspecified: Secondary | ICD-10-CM

## 2011-10-31 DIAGNOSIS — J42 Unspecified chronic bronchitis: Secondary | ICD-10-CM | POA: Insufficient documentation

## 2011-10-31 MED ORDER — IPRATROPIUM BROMIDE 0.02 % IN SOLN
RESPIRATORY_TRACT | Status: AC
  Administered 2011-10-31: 0.5 mg via RESPIRATORY_TRACT
  Filled 2011-10-31: qty 2.5

## 2011-10-31 MED ORDER — IPRATROPIUM BROMIDE 0.02 % IN SOLN
0.5000 mg | RESPIRATORY_TRACT | Status: DC
  Administered 2011-10-31: 0.5 mg via RESPIRATORY_TRACT
  Filled 2011-10-31: qty 2.5

## 2011-10-31 MED ORDER — IPRATROPIUM-ALBUTEROL 0.5-2.5 (3) MG/3ML IN SOLN: 3.0000 mL | RESPIRATORY_TRACT | Status: DC

## 2011-10-31 MED ORDER — ALBUTEROL SULFATE (5 MG/ML) 0.5% IN NEBU
INHALATION_SOLUTION | RESPIRATORY_TRACT | Status: AC
  Administered 2011-10-31: 2.5 mg via RESPIRATORY_TRACT
  Filled 2011-10-31: qty 0.5

## 2011-10-31 MED ORDER — ALBUTEROL SULFATE (5 MG/ML) 0.5% IN NEBU
2.5000 mg | INHALATION_SOLUTION | RESPIRATORY_TRACT | Status: DC
  Administered 2011-10-31: 2.5 mg via RESPIRATORY_TRACT
  Filled 2011-10-31: qty 0.5

## 2011-10-31 NOTE — Progress Notes (Signed)
UR Chart Review Completed  

## 2011-10-31 NOTE — Progress Notes (Signed)
Staff informed patient of new policy (bed alarm first 24 hours for new patient) several times during her admission. Patient stated that she would leave if it continued. Patient stated that she understood the importance of the alarm being on, however she wanted off.  She stated that she would not hold [facility] responsible.  Patient is alert and oriented and ambulates without difficulty.  Bed alarm was removed.

## 2011-10-31 NOTE — Progress Notes (Signed)
Paula, Navarro                 ACCOUNT NO.:  [id number]  MEDICAL RECORD NO.:  [id number]  LOCATION:  [facility]                         FACILITY:  [facility]  PHYSICIAN:  Tishie Altmann G. Renard Matter, MD   DATE OF BIRTH:  Jul 24, 1951  DATE OF PROCEDURE: DATE OF DISCHARGE:                                PROGRESS NOTE   This patient still continues to cough and has congestion.  She was admitted with what was felt to be bronchitis.  She does have underlying coronary artery disease and chest pain.  She is on Xopenex and Levaquin. She has been scheduled for Cardiology consult as well.  OBJECTIVE:  VITAL SIGNS:  Blood pressure 156/68, respirations 20, pulse 60 and temp 98.2.  The patient did have a chest x-ray, which did not show any acute abnormality. LUNGS:  Rhonchi bilaterally. HEART:  Regular rhythm. ABDOMEN:  No palpable organs or masses.  ASSESSMENT:  The patient will be admitted with bronchitis.  She does have chest pain.  She has had previous cardiac surgery.  PLAN:  To continue Xopenex inhaler, continue Levaquin 750 mg daily.  The patient will have Cardiology consult.     Virga Haltiwanger G. Renard Matter, MD     AGM/MEDQ  D:  10/31/2011  T:  10/31/2011  Job:  [id number]

## 2011-10-31 NOTE — Progress Notes (Signed)
   CARE MANAGEMENT NOTE 10/31/2011  Patient:  Paula Navarro, Paula Navarro   Account Number:  [id number]  Date Initiated:  10/31/2011  Documentation initiated by:  Sharrie Rothman  Subjective/Objective Assessment:   Pt admitted with bronchitis. Pt lives with family and is very independent.     Action/Plan:   CM spoke with pt and no CM need notified.   Anticipated DC Date:  10/31/2011   Anticipated DC Plan:  HOME/SELF CARE      DC Planning Services  CM consult      Choice offered to / List presented to:             Status of service:  Completed, signed off Medicare Important Message given?   (If response is "NO", the following Medicare IM given date fields will be blank) Date Medicare IM given:   Date Additional Medicare IM given:    Discharge Disposition:    Per UR Regulation:    If discussed at Long Length of Stay Meetings, dates discussed:    Comments:  10/31/11 1401 Arlyss Queen, RN BSN CM No CM needs noted.

## 2011-10-31 NOTE — Consult Note (Signed)
Patient Name: Paula Navarro  MRN: [id number]  HPI: Paula Navarro is an 61 y.o. femalereferred for consultation by Luanna Salk, MD for chest pain.  This nice woman is a long-standing cigarette smoker with symptoms suggestive of chronic bronchitis and acute exacerbation in recent weeks.  On the day of admission, she was troubled by increasing dyspnea after paroxysms of cough a sharp pain in over the cardiac apex that was exacerbated by cough.  Chest discomfort resolved once respiratory symptoms improved.  She denies significant dyspnea at rest or with exertion most of the time.  She has a chronic cough and sputum production.  She continues to consume more than one pack of cigarettes per day.   Patient has a history of atrial septal defect and underwent surgical repair in 1996.  She has had no subsequent cardiac problems except for palpitations without diagnosed arrhythmia.  Past Medical History  Diagnosis Date  . Chronic bronchitis   . Palpitation   . Chest pain   . Atrial septal defect   . Tobacco abuse    Past Surgical History  Procedure Date  . Asd repair 1996    Dr. Andrey Campanile; paracardial patch  . Cholecystectomy    History reviewed. No pertinent family history.  Social History:  reports that she has been smoking Cigarettes.  She has a 60 pack-year smoking history. She does not have any smokeless tobacco history on file. She reports that she does not drink alcohol or use illicit drugs.  Allergies:  Allergies  Allergen Reactions  . Codeine Other (See Comments)    Hot in chest   Medications:  I have reviewed the patient's current medications. Scheduled:   . sodium chloride   Intravenous STAT  . levofloxacin  750 mg Oral Daily    Results for orders placed during the hospital encounter of 10/30/11 (from the past 48 hour(s))  CBC     Status: Abnormal   Collection Time   10/30/11  2:19 PM      Component Value Range Comment   WBC 12.1 (*) 4.0 - 10.5 (K/uL)    RBC 5.80 (*)  3.87 - 5.11 (MIL/uL)    Hemoglobin 13.4  12.0 - 15.0 (g/dL)    HCT 02.7  25.3 - 66.4 (%)    MCV 70.9 (*) 78.0 - 100.0 (fL)    MCH 23.1 (*) 26.0 - 34.0 (pg)    MCHC 32.6  30.0 - 36.0 (g/dL)    RDW 40.3  47.4 - 25.9 (%)    Platelets 258  150 - 400 (K/uL)   DIFFERENTIAL     Status: Abnormal   Collection Time   10/30/11  2:19 PM      Component Value Range Comment   Neutrophils Relative 57  43 - 77 (%)    Neutro Abs 6.9  1.7 - 7.7 (K/uL)    Lymphocytes Relative 36  12 - 46 (%)    Lymphs Abs 4.3 (*) 0.7 - 4.0 (K/uL)    Monocytes Relative 4  3 - 12 (%)    Monocytes Absolute 0.5  0.1 - 1.0 (K/uL)    Eosinophils Relative 2  0 - 5 (%)    Eosinophils Absolute 0.3  0.0 - 0.7 (K/uL)    Basophils Relative 0  0 - 1 (%)    Basophils Absolute 0.1  0.0 - 0.1 (K/uL)   BASIC METABOLIC PANEL     Status: Abnormal   Collection Time   10/30/11  2:19 PM  Component Value Range Comment   Sodium 140  135 - 145 (mEq/L)    Potassium 3.9  3.5 - 5.1 (mEq/L)    Chloride 101  96 - 112 (mEq/L)    CO2 29  19 - 32 (mEq/L)    Glucose, Bld 118 (*) 70 - 99 (mg/dL)    BUN 9  6 - 23 (mg/dL)    Creatinine, Ser 1.61  0.50 - 1.10 (mg/dL)    Calcium 09.6  8.4 - 10.5 (mg/dL)    GFR calc non Af Amer >90  >90 (mL/min)    GFR calc Af Amer >90  >90 (mL/min)   TROPONIN I     Status: Normal   Collection Time   10/30/11  2:19 PM      Component Value Range Comment   Troponin I <0.30  <0.30 (ng/mL)     Dg Chest 2 View  10/30/2011  *RADIOLOGY REPORT*  Clinical Data: Chest pain.  Cough.  Smoker.  Prior surgery.  CHEST - 2 VIEW  Comparison: 02/17/2010 and 05/04/2008.  Findings: Chronic lung markings unchanged.  Prominence of the right hilum stable.  Slight tortuosity of the aorta.  No infiltrate, congestive heart failure or pneumothorax.  Heart size top normal.  On the lateral view, increased markings retrocardiac region probably vascular in origin.  If there is a persistent unexplained cough in this smoker, follow- up imaging may be  considered.  IMPRESSION: No acute abnormality.  Please see above.  Original Report Authenticated By: Fuller Canada, M.D.    Review of Systems: General: no anorexia, weight gain or weight loss Cardiac: no orthopnea, PND,  or syncope Respiratory: Chronic cough and sputum production; no hemoptysis GI: no nausea, abdominal pain, emesis, diarrhea or constipation Integument: no significant lesions Neurologic: No muscle weakness or paralysis; no speech disturbance; no headache All other systems reviewed and are negative.  Physical Exam: Blood pressure 122/78, pulse 69, temperature 97.5 F (36.4 C), temperature source Oral, resp. rate 20, height 5\' 4"  (1.626 m), weight 62.285 kg (137 lb 5 oz), last menstrual period 05/20/2011, SpO2 96.00%. General-Well-developed; no acute distress; proportionate weight and height HEENT-[place]/AT; PERRL; EOM intact; conjunctiva and lids nl Neck-No JVD; no carotid bruits Endocrine-No thyromegaly Lungs-Few right basilar rales; resonant percussion; marked wheezing and markedly prolonged expiratory phase with forced expiration Cardiovascular- normal PMI; normal S1 and S2; modest systolic ejection murmur Abdomen-BS normal; soft and non-tender without masses or organomegaly Musculoskeletal-No deformities, cyanosis or clubbing Neurologic-Nl cranial nerves; symmetric strength and tone Skin- Warm, no significant lesions Extremities-Nl distal pulses; no edema  Assessment/Plan:  Chest pain:  Atypical and likely of chest wall origin and secondary to trauma related to chronic cough.  Patient apparently had no coronary disease when she underwent ASD repair 15 years ago, but does have substantial risk factors, most notably tobacco use.  Symptoms can be treated symptomatically for the present.  We will proceed with a pharmacologic stress echocardiogram to exclude any significant coronary disease.  Palpitations:  Even with a remote history of ASD, patient remains at risk for  atrial fibrillation.  Telemetry will be helpful in determining whether any arrhythmias are occurring.  Chronic bronchitis:  This appears to be the patient's main problem.  She has some motivation to quit smoking cigarettes, which should be cultivated.She's been surprised at how little urge she has had smoked since Hospital admission, and would like to continue a quit attempt as an outpatient with pharmacologic assistance.  Nebulizers therapy will be ordered while she is  in hospital.  She reports no respiratory symptoms despite a markedly abnormal exam.  She may be minimizing her respiratory problems.  [place] Bing, MD 10/31/2011, 7:45 PM

## 2011-11-01 DIAGNOSIS — R0989 Other specified symptoms and signs involving the circulatory and respiratory systems: Secondary | ICD-10-CM

## 2011-11-01 DIAGNOSIS — R079 Chest pain, unspecified: Secondary | ICD-10-CM

## 2011-11-01 MED ORDER — LEVOFLOXACIN 750 MG PO TABS: 750.0000 mg | ORAL_TABLET | Freq: Every day | ORAL | Status: AC

## 2011-11-01 MED ORDER — LEVALBUTEROL TARTRATE 45 MCG/ACT IN AERO
2.0000 | INHALATION_SPRAY | RESPIRATORY_TRACT | Status: DC | PRN
  Administered 2011-11-01: 2 via RESPIRATORY_TRACT
  Filled 2011-11-01: qty 15

## 2011-11-01 MED ORDER — IPRATROPIUM BROMIDE 0.02 % IN SOLN
0.5000 mg | Freq: Three times a day (TID) | RESPIRATORY_TRACT | Status: DC
  Administered 2011-11-01: 0.5 mg via RESPIRATORY_TRACT
  Filled 2011-11-01 (×3): qty 2.5

## 2011-11-01 MED ORDER — ALPRAZOLAM 0.5 MG PO TABS
0.5000 mg | ORAL_TABLET | Freq: Four times a day (QID) | ORAL | Status: DC | PRN
  Administered 2011-11-01 (×2): 0.5 mg via ORAL
  Filled 2011-11-01 (×2): qty 1

## 2011-11-01 MED ORDER — LEVALBUTEROL HCL 0.63 MG/3ML IN NEBU
0.6300 mg | INHALATION_SOLUTION | Freq: Three times a day (TID) | RESPIRATORY_TRACT | Status: DC
  Administered 2011-11-01: 0.63 mg via RESPIRATORY_TRACT
  Filled 2011-11-01 (×3): qty 3

## 2011-11-01 NOTE — Progress Notes (Signed)
Paula Navarro, Paula Navarro                 ACCOUNT NO.:  [id number]  MEDICAL RECORD NO.:  [id number]  LOCATION:  [facility]                         FACILITY:  [facility]  PHYSICIAN:  Nathanel Tallman G. Renard Matter, MD   DATE OF BIRTH:  [birth year]  DATE OF PROCEDURE:  11/01/2011 DATE OF DISCHARGE:                                PROGRESS NOTE   SUBJECTIVE:  This patient admitted with bronchitis and chest pain.  She had previous cardiac surgery.  She continues to cough intermittently but has improved.  She is on Xopenex and Levaquin.  OBJECTIVE:  VITAL SIGNS:  Blood pressure 146/77, respirations 18, pulse 63, temperature 97.9. LUNGS:  Rhonchi bilaterally. HEART:  Regular rhythm. ABDOMEN:  No palpable organs or masses.  ASSESSMENT:  The patient was admitted with bronchitis.  She does have chest pain intermittently.  PLAN:  To continue Xopenex inhaler and continue IV Levaquin.  The patient was seen in consultation by Cardiology and was thought to have atypical chest pain.  They stated they would proceed with pharmacological stress echocardiogram to exclude any significant coronary disease.  Continue current regimen.     Paula Arline G. Renard Matter, MD     AGM/MEDQ  D:  11/01/2011  T:  11/01/2011  Job:  [id number]

## 2011-11-01 NOTE — Progress Notes (Signed)
Patient refused IV access for stress test.

## 2011-11-01 NOTE — Progress Notes (Signed)
Patient c/o "heart racing", Patient states that she was asleep and woke up with her heart racing, patient HR did become tachy on the monitor temporally but returned to baseline on SR in 70's on the monitor, obtained VS BP 132/75, HR 71, o2 93% on RA, Patient denies any chest pain or shortness of breath at this time, asymptomatic, patient does state that she is nervous and having some difficulty with sleep, called Dr. Sudie Bailey and made aware, ordered xanax 0.5mg  prn for anxiety, will continue to monitor patient at this time.

## 2011-11-01 NOTE — Discharge Summary (Signed)
Paula Navarro, Paula Navarro                 ACCOUNT NO.:  [id number]  MEDICAL RECORD NO.:  [id number]  LOCATION:  [facility]                         FACILITY:  [facility]  PHYSICIAN:  Swayzee Wadley G. Renard Matter, MD   DATE OF BIRTH:  [birth year]  DATE OF ADMISSION:  10/30/2011 DATE OF DISCHARGE:  04/03/2013LH                              DISCHARGE SUMMARY   DIAGNOSES:  Bronchitis, history of atrial septal defect, chest pain is atypical, previous history of cardiac surgery, cigarette abuse.  CONDITION:  Stable and improved at time of discharge.  This 61 year old female had bouts of coughing intermittently and dyspnea and severe anterior chest pain, which lasted some 30 minutes, which occurred after a bout of coughing.  She does have a previous history of cardiac surgery done by Dr. Nydia Bouton at [facility] in [facility]. Apparently, this was repair of atrial and septal defect.  She continues to smoke 1-1/2 packs of cigarettes daily.  She is admitted primarily because of the chest pain and dyspnea.  PHYSICAL EXAMINATION ON ADMISSION:  VITAL SIGNS:  Blood pressure 135/85, respirations 20, pulse 61, temperature 97.6. LUNGS:  Clear to P and A, occasional decreased breath sounds, occasional rhonchus. HEART:  Regular rhythm, rate of 80. ABDOMEN:  No palpable organs or masses.  No organomegaly.  EXTREMITIES: Free of edema.  LABORATORY DATA:  CBC on admission, WBC 12,100, hemoglobin 13.4, hematocrit 41.1, 57 neutrophils, 36 lymphocytes.  Chemistries, sodium 140, potassium 3.9, chloride 101, CO2 of 29, BUN 9, creatinine 0.76, calcium 10.1, GFR greater than 90.  Troponin less than 0.30, CK-MB 2.0, myoglobin 71.7.  The chest x-ray showed no acute abnormality.  EKG results not available.  HOSPITAL COURSE:  The patient at the time of her admission was started on intravenous fluids, was also started on IV Levaquin 750 mg daily, Xopenex by nebulization t.i.d., Atrovent by nebulization t.i.d.  She is also given Xanax 0.5  mg q.i.d. p.r.n., and Tessalon Perles 100 mg t.i.d. p.r.n.  The patient also was seen by Cardiology, and Dr. Dietrich Pates felt that her chest pain was probably chest wall pain secondary to trauma related to chronic cough.  He felt she had no coronary artery disease for which she underwent ASD repair 15 years ago, but had substantial risk factors mostly secondary to tobacco use and recommended that she stopped smoking cigarettes.  A cardiac stress test was recommended, but the patient refused.  She had negative troponin and chest x-ray was essentially normal.  It was felt that she could be discharged to be followed as an outpatient, and possibly stress test could be done as an outpatient.  The patient was discharged on Xanax 0.5 mg every 4 hours p.r.n., Proventil HFA inhaler, Levaquin 750 mg daily, and Hycodan cough medicine that she was asked to return to her primary care physician, in approximately a week's time to call for an appointment.     Yeiren Whitecotton G. Renard Matter, MD     AGM/MEDQ  D:  11/01/2011  T:  11/01/2011  Job:  [id number]

## 2011-11-03 ENCOUNTER — Ambulatory Visit (HOSPITAL_COMMUNITY): Admission: RE | Admit: 2011-11-03 | Payer: Medicare Other | Source: Ambulatory Visit

## 2011-11-22 ENCOUNTER — Encounter: Payer: Self-pay | Admitting: *Deleted

## 2011-11-22 ENCOUNTER — Telehealth: Payer: Self-pay | Admitting: *Deleted

## 2011-11-22 NOTE — Telephone Encounter (Signed)
Dobutamine Stress Test was not scheduled post hospital, as requested by Dr Dietrich Pates, therefore patient has been scheduled for procedure in the am.  Pt to arrive at 8:30 for 11 am exam.  Procedure explained to patient and advised her to have nothing to eat or drink after midnight.  Verbalized understanding.

## 2011-11-23 ENCOUNTER — Other Ambulatory Visit (HOSPITAL_COMMUNITY): Payer: Medicare Other

## 2011-11-23 ENCOUNTER — Ambulatory Visit (HOSPITAL_COMMUNITY): Admission: RE | Admit: 2011-11-23 | Payer: Medicare Other | Source: Ambulatory Visit

## 2011-11-25 ENCOUNTER — Encounter: Payer: Self-pay | Admitting: Cardiology

## 2011-11-25 DIAGNOSIS — Z0189 Encounter for other specified special examinations: Secondary | ICD-10-CM | POA: Insufficient documentation

## 2011-11-27 ENCOUNTER — Encounter: Payer: Medicare Other | Admitting: Cardiology

## 2011-11-29 ENCOUNTER — Ambulatory Visit (HOSPITAL_COMMUNITY): Payer: Medicare Other | Attending: Cardiology

## 2011-11-29 ENCOUNTER — Ambulatory Visit (HOSPITAL_COMMUNITY): Payer: Medicare Other

## 2011-11-30 ENCOUNTER — Telehealth: Payer: Self-pay | Admitting: *Deleted

## 2011-11-30 NOTE — Telephone Encounter (Signed)
Patient is refusing stress echocardiogram at this time.  Will address at follow up visit.

## 2011-12-09 ENCOUNTER — Encounter: Payer: Self-pay | Admitting: Cardiology

## 2011-12-09 DIAGNOSIS — R079 Chest pain, unspecified: Secondary | ICD-10-CM | POA: Insufficient documentation

## 2011-12-09 DIAGNOSIS — Q211 Atrial septal defect: Secondary | ICD-10-CM | POA: Insufficient documentation

## 2011-12-09 DIAGNOSIS — R002 Palpitations: Secondary | ICD-10-CM | POA: Insufficient documentation

## 2011-12-12 ENCOUNTER — Encounter: Payer: Medicare Other | Admitting: Cardiology

## 2012-02-09 ENCOUNTER — Emergency Department (HOSPITAL_COMMUNITY)
Admission: EM | Admit: 2012-02-09 | Discharge: 2012-02-09 | Disposition: A | Discharge status: Home / Self Care | Payer: Medicare Other | Attending: Emergency Medicine | Admitting: Emergency Medicine

## 2012-02-09 ENCOUNTER — Emergency Department (HOSPITAL_COMMUNITY): Payer: Medicare Other

## 2012-02-09 ENCOUNTER — Encounter (HOSPITAL_COMMUNITY): Payer: Self-pay

## 2012-02-09 DIAGNOSIS — J42 Unspecified chronic bronchitis: Secondary | ICD-10-CM | POA: Insufficient documentation

## 2012-02-09 DIAGNOSIS — M545 Low back pain, unspecified: Secondary | ICD-10-CM | POA: Insufficient documentation

## 2012-02-09 DIAGNOSIS — Z79899 Other long term (current) drug therapy: Secondary | ICD-10-CM | POA: Insufficient documentation

## 2012-02-09 DIAGNOSIS — M25559 Pain in unspecified hip: Secondary | ICD-10-CM | POA: Insufficient documentation

## 2012-02-09 DIAGNOSIS — F17200 Nicotine dependence, unspecified, uncomplicated: Secondary | ICD-10-CM | POA: Insufficient documentation

## 2012-02-09 DIAGNOSIS — IMO0001 Reserved for inherently not codable concepts without codable children: Secondary | ICD-10-CM | POA: Insufficient documentation

## 2012-02-09 MED ORDER — HYDROCODONE-ACETAMINOPHEN 5-325 MG PO TABS
1.0000 | ORAL_TABLET | Freq: Once | ORAL | Status: AC
  Administered 2012-02-09: 1 via ORAL
  Filled 2012-02-09: qty 1

## 2012-02-09 MED ORDER — TRAMADOL HCL 50 MG PO TABS: 50.0000 mg | ORAL_TABLET | Freq: Four times a day (QID) | ORAL | Status: DC | PRN

## 2012-02-09 MED ORDER — HYDROCODONE-ACETAMINOPHEN 5-325 MG PO TABS: ORAL_TABLET | ORAL | Status: AC

## 2012-02-09 NOTE — ED Notes (Signed)
Pt reports pain in lower back radiating down r buttock and leg since 12noon today.  Denies any injury.  Pt says was laying down when the pain started.

## 2012-02-09 NOTE — ED Provider Notes (Signed)
History     CSN: [id number]  Arrival date & time 02/09/12  1836   First MD Initiated Contact with Patient 02/09/12 1929      Chief Complaint  Patient presents with  . Hip Pain    HPI Pt was seen at 1930.  Per pt, c/o gradual onset and persistence of constant right sided low back "pain" which radiates into her right buttock that began this afternoon. States for past several days she has been "helping my mother in and out of bed" before the pain began.  Pain worsens with palpation of the area and body position changes.  Denies incont/retention of bowel or bladder, no saddle anesthesia, no focal motor weakness, no tingling/numbness in extremities, no fevers, no injury, no abd pain, no dysuria. The symptoms have been associated with no other complaints.     Past Medical History  Diagnosis Date  . Chronic bronchitis   . Palpitation     Tachycardia reported by monitor clerk during a symptomatic spell  . Chest pain     Admitted to [facility] in 09/2011; refused stress test  . Atrial septal defect 1996    Surgical repair in 1996  . Tobacco abuse     60 pack years; 1.5 packs per day    Past Surgical History  Procedure Date  . Asd repair 1996    Dr. Andrey Campanile; pericardial patch  . Cholecystectomy     History  Substance Use Topics  . Smoking status: Current Everyday Smoker -- 1.5 packs/day for 40 years    Types: Cigarettes  . Smokeless tobacco: Not on file  . Alcohol Use: No    Review of Systems ROS: Statement: All systems negative except as marked or noted in the HPI; Constitutional: Negative for fever and chills. ; ; Eyes: Negative for eye pain, redness and discharge. ; ; ENMT: Negative for ear pain, hoarseness, nasal congestion, sinus pressure and sore throat. ; ; Cardiovascular: Negative for chest pain, palpitations, diaphoresis, dyspnea and peripheral edema. ; ; Respiratory: Negative for cough, wheezing and stridor. ; ; Gastrointestinal: Negative for nausea, vomiting, diarrhea, abdominal  pain, blood in stool, hematemesis, jaundice and rectal bleeding. . ; ; Genitourinary: Negative for dysuria, flank pain and hematuria. ; ; Musculoskeletal: +LBP.  Negative for neck pain. Negative for swelling and trauma.; ; Skin: Negative for pruritus, rash, abrasions, blisters, bruising and skin lesion.; ; Neuro: Negative for headache, lightheadedness and neck stiffness. Negative for weakness, altered level of consciousness , altered mental status, extremity weakness, paresthesias, involuntary movement, seizure and syncope.     Allergies  Codeine  Home Medications   Current Outpatient Rx  Name Route Sig Dispense Refill  . ALBUTEROL SULFATE HFA 108 (90 BASE) MCG/ACT IN AERS Inhalation Inhale 2 puffs into the lungs every 4 (four) hours as needed for wheezing. 1 Inhaler 0  . ALPRAZOLAM 0.5 MG PO TABS Oral Take 0.25-0.5 mg by mouth daily as needed. **Takes as needed for sleep**    . LEVOFLOXACIN 750 MG PO TABS Oral Take 750 mg by mouth daily.      BP 132/72  Pulse 78  Temp 98.4 F (36.9 C) (Oral)  Resp 20  Ht 5\' 2"  (1.575 m)  Wt 132 lb (59.875 kg)  BMI 24.14 kg/m2  SpO2 98%  LMP 05/20/2011  Physical Exam 1935: Physical examination:  Nursing notes reviewed; Vital signs and O2 SAT reviewed;  Constitutional: Well developed, Well nourished, Well hydrated, In no acute distress; Head:  Normocephalic, atraumatic; Eyes: EOMI, PERRL,  No scleral icterus; ENMT: Mouth and pharynx normal, Mucous membranes moist; Neck: Supple, Full range of motion, No lymphadenopathy; Cardiovascular: Regular rate and rhythm, No murmur, rub, or gallop; Respiratory: Breath sounds clear & equal bilaterally, No rales, rhonchi, wheezes.  Speaking full sentences with ease, Normal respiratory effort/excursion; Chest: Nontender, Movement normal; Abdomen: Soft, Nontender, Nondistended, Normal bowel sounds; Genitourinary: No CVA tenderness; Spine:  No midline CS, TS, LS tenderness.  +TTP right lumbar paraspinal muscles.;;  Extremities: Pulses normal, No tenderness, No edema, No calf edema or asymmetry.; Neuro: AA&Ox3, Major CN grossly intact.  Speech clear. Strength 5/5 equal bilat UE's and LE's, including great toe dorsiflexion.  DTR 2/4 equal bilat UE's and LE's.  No gross sensory deficits.  Neg straight leg raises bilat., No gross focal motor or sensory deficits in extremities.; Skin: Color normal, Warm, Dry, no rash.   ED Course  Procedures   MDM  MDM Reviewed: nursing note and vitals Interpretation: x-ray     Dg Lumbar Spine Complete 02/09/2012  *RADIOLOGY REPORT*  Clinical Data: Low back pain.  LUMBAR SPINE - COMPLETE 4+ VIEW  Comparison: None.  Findings: Alignment is anatomic.  Vertebral body height is maintained.  Mild facet hypertrophy at L5-S1.  No definite pars defects.  IMPRESSION: L5-S1 facet hypertrophy.  No acute findings.  Original Report Authenticated By: Reyes Ivan, M.D.      8:07 PM:  Pt states her pain began after she has been helping move her mother in and out of bed for the past week.  No concerning signs for cauda equina at this time.  Appears msk pain at this time.  Will treat symptomatically.  Dx testing d/w pt.  Questions answered.  Verb understanding, agreeable to d/c home with outpt f/u.    Laray Anger, DO 02/10/12 1457

## 2012-02-09 NOTE — ED Notes (Signed)
Pt alert & oriented x4, stable gait. Patient given discharge instructions, paperwork & prescription(s). Patient  instructed to stop at the registration desk to finish any additional paperwork. Patient verbalized understanding. Pt left department w/ no further questions. 

## 2012-09-22 ENCOUNTER — Emergency Department (HOSPITAL_COMMUNITY): Payer: Medicare Other

## 2012-09-22 ENCOUNTER — Emergency Department (HOSPITAL_COMMUNITY)
Admission: EM | Admit: 2012-09-22 | Discharge: 2012-09-22 | Disposition: A | Discharge status: Home / Self Care | Payer: Medicare Other | Attending: Emergency Medicine | Admitting: Emergency Medicine

## 2012-09-22 ENCOUNTER — Encounter (HOSPITAL_COMMUNITY): Payer: Self-pay | Admitting: *Deleted

## 2012-09-22 DIAGNOSIS — R062 Wheezing: Secondary | ICD-10-CM | POA: Insufficient documentation

## 2012-09-22 DIAGNOSIS — Z792 Long term (current) use of antibiotics: Secondary | ICD-10-CM | POA: Insufficient documentation

## 2012-09-22 DIAGNOSIS — J209 Acute bronchitis, unspecified: Secondary | ICD-10-CM | POA: Insufficient documentation

## 2012-09-22 DIAGNOSIS — Z8679 Personal history of other diseases of the circulatory system: Secondary | ICD-10-CM | POA: Insufficient documentation

## 2012-09-22 DIAGNOSIS — F17200 Nicotine dependence, unspecified, uncomplicated: Secondary | ICD-10-CM | POA: Insufficient documentation

## 2012-09-22 DIAGNOSIS — R059 Cough, unspecified: Secondary | ICD-10-CM | POA: Insufficient documentation

## 2012-09-22 DIAGNOSIS — R0609 Other forms of dyspnea: Secondary | ICD-10-CM | POA: Insufficient documentation

## 2012-09-22 DIAGNOSIS — R0989 Other specified symptoms and signs involving the circulatory and respiratory systems: Secondary | ICD-10-CM | POA: Insufficient documentation

## 2012-09-22 DIAGNOSIS — Z79899 Other long term (current) drug therapy: Secondary | ICD-10-CM | POA: Insufficient documentation

## 2012-09-22 DIAGNOSIS — R05 Cough: Secondary | ICD-10-CM | POA: Insufficient documentation

## 2012-09-22 MED ORDER — OMEPRAZOLE 20 MG PO CPDR: 20.0000 mg | DELAYED_RELEASE_CAPSULE | Freq: Every day | ORAL | Status: DC

## 2012-09-22 MED ORDER — ALBUTEROL SULFATE HFA 108 (90 BASE) MCG/ACT IN AERS: 1.0000 | INHALATION_SPRAY | Freq: Four times a day (QID) | RESPIRATORY_TRACT | Status: DC | PRN

## 2012-09-22 MED ORDER — PREDNISONE 50 MG PO TABS: ORAL_TABLET | ORAL | Status: DC

## 2012-09-22 MED ORDER — PREDNISONE 50 MG PO TABS
60.0000 mg | ORAL_TABLET | Freq: Once | ORAL | Status: AC
  Administered 2012-09-22: 60 mg via ORAL
  Filled 2012-09-22: qty 1

## 2012-09-22 NOTE — ED Provider Notes (Addendum)
History    This chart was scribed for Paula Gaskins, MD by Gerlean Ren, ED Scribe. This patient was seen in room [facility] and the patient's care was started at 11:26 PM    CSN: [id number]  Arrival date & time 09/22/12  2148   First MD Initiated Contact with Patient 09/22/12 2259      Chief Complaint  Patient presents with  . Shortness of Breath     The history is provided by the patient. No language interpreter was used.  Paula Navarro is a 62 y.o. female who presents to the Emergency Department complaining of productive cough since 07/23/2012 with associated constant dyspnea worsened by ambulation that began worsening today.  Pt used albuterol inhaler today that she states did not improve dyspnea and caused nasal congestion.   Pt denies chest pain, abdominal pain, emesis.  Pt has h/o open heart surgery.  Pt reports current everyday tobacco use.  No anticoagulants. PCP is Dr. Renard Matter.    Past Medical History  Diagnosis Date  . Chronic bronchitis   . Palpitation     Tachycardia reported by monitor clerk during a symptomatic spell  . Chest pain     Admitted to [facility] in 09/2011; refused stress test  . Atrial septal defect 1996    Surgical repair in 1996  . Tobacco abuse     60 pack years; 1.5 packs per day    Past Surgical History  Procedure Laterality Date  . Asd repair  1996    Dr. Andrey Campanile; pericardial patch  . Cholecystectomy      History reviewed. No pertinent family history.  History  Substance Use Topics  . Smoking status: Current Every Day Smoker -- 1.50 packs/day for 40 years    Types: Cigarettes  . Smokeless tobacco: Not on file  . Alcohol Use: No    No OB history provided.   Review of Systems  HENT: Positive for congestion.   Respiratory: Positive for cough, shortness of breath and wheezing.   Cardiovascular: Negative for chest pain.  Gastrointestinal: Negative for vomiting and abdominal pain.  All other systems reviewed and are  negative.    Allergies  Codeine  Home Medications   Current Outpatient Rx  Name  Route  Sig  Dispense  Refill  . EXPIRED: albuterol (PROVENTIL HFA;VENTOLIN HFA) 108 (90 BASE) MCG/ACT inhaler   Inhalation   Inhale 2 puffs into the lungs every 4 (four) hours as needed for wheezing.   1 Inhaler   0   . ALPRAZolam (XANAX) 0.5 MG tablet   Oral   Take 0.25-0.5 mg by mouth daily as needed. **Takes as needed for sleep**         . levofloxacin (LEVAQUIN) 750 MG tablet   Oral   Take 750 mg by mouth daily.           BP 143/78  Temp(Src) 97.9 F (36.6 C) (Oral)  Resp 22  Ht 5\' 2"  (1.575 m)  Wt 134 lb (60.782 kg)  BMI 24.5 kg/m2  SpO2 100%  LMP 05/20/2011  Physical Exam CONSTITUTIONAL: Well developed/well nourished HEAD: Normocephalic/atraumatic EYES: EOMI/PERRL ENMT: Mucous membranes moist NECK: supple no meningeal signs SPINE:entire spine nontender CV: S1/S2 noted, no murmurs/rubs/gallops noted LUNGS: wheezes bilaterally, no apparent distress ABDOMEN: soft, nontender, no rebound or guarding GU:no cva tenderness NEURO: Pt is awake/alert, moves all extremitiesx4 EXTREMITIES: pulses normal, full ROM, no edema SKIN: warm, color normal PSYCH: no abnormalities of mood noted  ED Course  Procedures (including  critical care time) DIAGNOSTIC STUDIES: Oxygen Saturation is 100% on room air, normal by my interpretation.    COORDINATION OF CARE: 11:33 PM- Informed pt that I will provided steroids here and a prescription at discharge.  Advised pt to sue albuterol inhaler as needed.   Dg Chest 2 View  09/22/2012  *RADIOLOGY REPORT*  Clinical Data: Cough and shortness of breath.  CHEST - 2 VIEW  Comparison: 10/30/2011 and prior chest radiographs dating back to 2007.  Findings: Cardiomegaly, median sternotomy wires and prominent left hilum again noted. Chronic peribronchial thickening again noted with mild bibasilar scarring. There is no evidence of focal airspace disease,  pulmonary edema, suspicious pulmonary nodule/mass, pleural effusion, or pneumothorax. No acute bony abnormalities are identified. .  IMPRESSION: No evidence of acute cardiopulmonary disease.   Original Report Authenticated By: Harmon Pier, M.D.     Pt with cough and wheeze noted.  Suspect bronchitic.  She denies hemoptysis.  Will add on prednisone and encouraged to quit smoking and to continue albuterol Pt agreeable  MDM  Nursing notes including past medical history and social history reviewed and considered in documentation xrays reviewed and considered     Date: 09/23/2012  Rate: 73  Rhythm: normal sinus rhythm  QRS Axis: normal  Intervals: QT prolonged and but unchanged from prior  ST/T Wave abnormalities: nonspecific ST changes  Conduction Disutrbances:none  Narrative Interpretation:   Old EKG Reviewed: unchanged    I personally performed the services described in this documentation, which was scribed in my presence. The recorded information has been reviewed and is accurate.           Paula Gaskins, MD 09/23/12 0033  12:38 AM Update - no tachycardia noted on my exam.   Paula Gaskins, MD 09/23/12 [phone]

## 2012-09-22 NOTE — ED Notes (Addendum)
Pt c/o cough and sob since Christmas Eve. Also c/o left sided mid back pain

## 2012-09-23 ENCOUNTER — Encounter (HOSPITAL_COMMUNITY): Payer: Self-pay | Admitting: Emergency Medicine

## 2012-09-23 ENCOUNTER — Other Ambulatory Visit: Payer: Self-pay

## 2012-09-23 ENCOUNTER — Emergency Department (HOSPITAL_COMMUNITY)
Admission: EM | Admit: 2012-09-23 | Discharge: 2012-09-23 | Disposition: A | Discharge status: Home / Self Care | Payer: Medicare Other | Attending: Emergency Medicine | Admitting: Emergency Medicine

## 2012-09-23 DIAGNOSIS — F17200 Nicotine dependence, unspecified, uncomplicated: Secondary | ICD-10-CM | POA: Insufficient documentation

## 2012-09-23 DIAGNOSIS — R9431 Abnormal electrocardiogram [ECG] [EKG]: Secondary | ICD-10-CM

## 2012-09-23 DIAGNOSIS — Z8709 Personal history of other diseases of the respiratory system: Secondary | ICD-10-CM | POA: Insufficient documentation

## 2012-09-23 DIAGNOSIS — Z79899 Other long term (current) drug therapy: Secondary | ICD-10-CM | POA: Insufficient documentation

## 2012-09-23 DIAGNOSIS — R059 Cough, unspecified: Secondary | ICD-10-CM | POA: Insufficient documentation

## 2012-09-23 DIAGNOSIS — Z87798 Personal history of other (corrected) congenital malformations: Secondary | ICD-10-CM | POA: Insufficient documentation

## 2012-09-23 DIAGNOSIS — R002 Palpitations: Secondary | ICD-10-CM | POA: Insufficient documentation

## 2012-09-23 DIAGNOSIS — R0602 Shortness of breath: Secondary | ICD-10-CM | POA: Insufficient documentation

## 2012-09-23 DIAGNOSIS — I4581 Long QT syndrome: Secondary | ICD-10-CM | POA: Insufficient documentation

## 2012-09-23 DIAGNOSIS — R05 Cough: Secondary | ICD-10-CM | POA: Insufficient documentation

## 2012-09-23 DIAGNOSIS — Z8673 Personal history of transient ischemic attack (TIA), and cerebral infarction without residual deficits: Secondary | ICD-10-CM | POA: Insufficient documentation

## 2012-09-23 LAB — POCT I-STAT, CHEM 8
Chloride: 107 mEq/L (ref 96–112)
Glucose, Bld: 192 mg/dL — ABNORMAL HIGH (ref 70–99)
HCT: 44 % (ref 36.0–46.0)
Hemoglobin: 15 g/dL (ref 12.0–15.0)
Potassium: 3.8 mEq/L (ref 3.5–5.1)
Sodium: 141 mEq/L (ref 135–145)

## 2012-09-23 LAB — TROPONIN I: Troponin I: <0.3 ng/mL (ref <0.30)

## 2012-09-23 NOTE — ED Notes (Signed)
Pt complains of palpitations, says "I feel like my heart is racing" states that it felt faster earlier. She was discharges late yesterday with bronchitis and states she thinks that the steroids made her heart faster. Pulse on triage is 81. Denies SOB, denies chest pain.

## 2012-09-23 NOTE — ED Provider Notes (Signed)
History     CSN: [id number]  Arrival date & time 09/23/12  2440   First MD Initiated Contact with Patient 09/23/12 [phone]      Chief Complaint  Patient presents with  . Palpitations     Patient is a 62 y.o. female presenting with palpitations. The history is provided by the patient.  Palpitations  This is a new problem. The current episode started less than 1 hour ago. The problem occurs constantly. The problem has been resolved. The problem is associated with an unknown factor. Episode Length: less than 30 minutes. Associated symptoms include cough and shortness of breath. Pertinent negatives include no diaphoresis, no fever, no chest pain, no chest pressure, no irregular heartbeat, no abdominal pain, no nausea, no vomiting and no weakness. She has tried bed rest for the symptoms. The treatment provided significant relief. Risk factors include smoking/tobacco exposure.  pt reports she woke up with feeling that her heart was racing She called 911, and the operator on the phone had her count her heart rate and it was approximately 130 but pt reports it seemed regular. They instructed her to take ASA. She reports the feeling has since resolved. No LOC.  No active CP.  She does have some SOB, but reports likely related to her bronchitis She was just given prednisone last night for bronchitis and she feels this may be contributing to her symptoms  She reports h/o atrial septal defect repair in the 1990s.  She has had no issues with this since surgery.  She reports she does not take medications for this.  She reports she is not known to have heart disease and does not have known CHF  No LE edema.  No h/o DVT/Pe.  No pleuritic CP.  No recent travel.   No pleuritic CP is reported Past Medical History  Diagnosis Date  . Chronic bronchitis   . Palpitation     Tachycardia reported by monitor clerk during a symptomatic spell  . Chest pain     Admitted to [facility] in 09/2011; refused stress test  .  Atrial septal defect 1996    Surgical repair in 1996  . Tobacco abuse     60 pack years; 1.5 packs per day    Past Surgical History  Procedure Laterality Date  . Asd repair  1996    Dr. Andrey Campanile; pericardial patch  . Cholecystectomy      History reviewed. No pertinent family history.  History  Substance Use Topics  . Smoking status: Current Every Day Smoker -- 1.50 packs/day for 40 years    Types: Cigarettes  . Smokeless tobacco: Not on file  . Alcohol Use: No    OB History   Grav Para Term Preterm Abortions TAB SAB Ect Mult Living                  Review of Systems  Constitutional: Negative for fever and diaphoresis.  Respiratory: Positive for cough and shortness of breath.   Cardiovascular: Positive for palpitations. Negative for chest pain and leg swelling.  Gastrointestinal: Negative for nausea, vomiting and abdominal pain.  Neurological: Negative for syncope and weakness.  All other systems reviewed and are negative.    Allergies  Codeine  Home Medications   Current Outpatient Rx  Name  Route  Sig  Dispense  Refill  . EXPIRED: albuterol (PROVENTIL HFA;VENTOLIN HFA) 108 (90 BASE) MCG/ACT inhaler   Inhalation   Inhale 2 puffs into the lungs every 4 (four) hours as  needed for wheezing.   1 Inhaler   0   . albuterol (PROVENTIL HFA;VENTOLIN HFA) 108 (90 BASE) MCG/ACT inhaler   Inhalation   Inhale 1-2 puffs into the lungs every 6 (six) hours as needed for wheezing.   1 Inhaler   0   . ALPRAZolam (XANAX) 0.5 MG tablet   Oral   Take 0.25-0.5 mg by mouth daily as needed. **Takes as needed for sleep**         . levofloxacin (LEVAQUIN) 750 MG tablet   Oral   Take 750 mg by mouth daily.         Marland Kitchen omeprazole (PRILOSEC) 20 MG capsule   Oral   Take 1 capsule (20 mg total) by mouth daily.   30 capsule   0   . predniSONE (DELTASONE) 50 MG tablet      One tablet PO daily for 4 days   4 tablet   0     BP 149/80  Pulse 80  Temp(Src) 98.3 F (36.8  C) (Oral)  Resp 17  SpO2 96%  LMP 05/20/2011 BP 143/78  Pulse 78  Temp(Src) 98.3 F (36.8 C) (Oral)  Resp 19  SpO2 97%  LMP 05/20/2011   Physical Exam CONSTITUTIONAL: Well developed/well nourished HEAD: Normocephalic/atraumatic EYES: EOMI/PERRL ENMT: Mucous membranes moist NECK: supple no meningeal signs SPINE:entire spine nontender CV: S1/S2 noted, no murmurs/rubs/gallops noted LUNGS: scattered wheezing noted bilaterally.  No distress noted.  No rales noted ABDOMEN: soft, nontender, no rebound or guarding GU:no cva tenderness NEURO: Pt is awake/alert, moves all extremitiesx4 EXTREMITIES: pulses normal, full ROM, no edema in her LE and no calf tenderness SKIN: warm, color normal PSYCH: no abnormalities of mood noted   ED Course  Procedures  4:53 AM Pt here for palpitations that have since resolved.  Currently, EKG shows sinus rhythm that is rate controlled.  Her qt is prolonged, however QTc less than 500 and potassium normal on electrolyte panel and no significant worsening from previous tracing.  She is on no medications that could contribute to prolonged qt (no antibiotics currently) She reports she feels improved.  She reports she was supposed to have stress echo last year but she never had it done Given SOB and palpitations, troponin and BNP sent.  It is possible these palpitations are related to her h/o ASD repair Pt agreeable with plan It is also possible the palpitations are related to just starting steroids (she will hold those for now) Pt will need to have outpatient echo and also possible outpatient monitoring   5:55 AM Pt without any recurrent symptoms She continues to deny chest pain and denies ever having nausea/vomiting/diaphoresis - do not feel further EKG/troponin testing necessary.  I doubt ACS at this time Does not appear to be in acute CHF currently (pt did report sob/wheezing, but now feel likely due to bronchitis) I doubt PE as she denies any  pleuritic pain, and she is ambulatory in the ED without any CP/SOB Advised need for outpatient cardiology evaluation for potential echo and monitoring   MDM  Nursing notes including past medical history and social history reviewed and considered in documentation Labs/vital reviewed and considered Previous records reviewed and considered - recent ED visit reviewed        Date: 09/23/2012 03:41am  Rate: 81  Rhythm: normal sinus rhythm  QRS Axis: normal  Intervals: qt prolonged but similar to prior  ST/T Wave abnormalities: nonspecific ST changes  Conduction Disutrbances:none  Narrative Interpretation:   Old EKG  Reviewed: unchanged No change from EKG on 09/22/12   [name], MD 09/23/12 [phone]

## 2013-03-20 ENCOUNTER — Emergency Department (HOSPITAL_COMMUNITY): Payer: Medicare Other

## 2013-03-20 ENCOUNTER — Emergency Department (HOSPITAL_COMMUNITY)
Admission: EM | Admit: 2013-03-20 | Discharge: 2013-03-20 | Disposition: A | Discharge status: Home / Self Care | Payer: Medicare Other | Attending: Emergency Medicine | Admitting: Emergency Medicine

## 2013-03-20 ENCOUNTER — Encounter (HOSPITAL_COMMUNITY): Payer: Self-pay | Admitting: Emergency Medicine

## 2013-03-20 DIAGNOSIS — Y929 Unspecified place or not applicable: Secondary | ICD-10-CM | POA: Insufficient documentation

## 2013-03-20 DIAGNOSIS — Z8774 Personal history of (corrected) congenital malformations of heart and circulatory system: Secondary | ICD-10-CM | POA: Insufficient documentation

## 2013-03-20 DIAGNOSIS — R002 Palpitations: Secondary | ICD-10-CM | POA: Insufficient documentation

## 2013-03-20 DIAGNOSIS — Z79899 Other long term (current) drug therapy: Secondary | ICD-10-CM | POA: Insufficient documentation

## 2013-03-20 DIAGNOSIS — X500XXA Overexertion from strenuous movement or load, initial encounter: Secondary | ICD-10-CM | POA: Insufficient documentation

## 2013-03-20 DIAGNOSIS — Y9389 Activity, other specified: Secondary | ICD-10-CM | POA: Insufficient documentation

## 2013-03-20 DIAGNOSIS — F17200 Nicotine dependence, unspecified, uncomplicated: Secondary | ICD-10-CM | POA: Insufficient documentation

## 2013-03-20 DIAGNOSIS — S46911A Strain of unspecified muscle, fascia and tendon at shoulder and upper arm level, right arm, initial encounter: Secondary | ICD-10-CM

## 2013-03-20 DIAGNOSIS — Z8709 Personal history of other diseases of the respiratory system: Secondary | ICD-10-CM | POA: Insufficient documentation

## 2013-03-20 DIAGNOSIS — IMO0002 Reserved for concepts with insufficient information to code with codable children: Secondary | ICD-10-CM | POA: Insufficient documentation

## 2013-03-20 DIAGNOSIS — J42 Unspecified chronic bronchitis: Secondary | ICD-10-CM | POA: Insufficient documentation

## 2013-03-20 MED ORDER — KETOROLAC TROMETHAMINE 10 MG PO TABS
10.0000 mg | ORAL_TABLET | Freq: Once | ORAL | Status: AC
  Administered 2013-03-20: 10 mg via ORAL
  Filled 2013-03-20: qty 1

## 2013-03-20 MED ORDER — ONDANSETRON HCL 4 MG PO TABS
4.0000 mg | ORAL_TABLET | Freq: Once | ORAL | Status: AC
  Administered 2013-03-20: 4 mg via ORAL
  Filled 2013-03-20: qty 1

## 2013-03-20 MED ORDER — CELECOXIB 100 MG PO CAPS: 100.0000 mg | ORAL_CAPSULE | Freq: Two times a day (BID) | ORAL | Status: DC

## 2013-03-20 MED ORDER — DIAZEPAM 5 MG PO TABS
5.0000 mg | ORAL_TABLET | Freq: Once | ORAL | Status: AC
  Administered 2013-03-20: 5 mg via ORAL
  Filled 2013-03-20: qty 1

## 2013-03-20 MED ORDER — DIAZEPAM 5 MG PO TABS: ORAL_TABLET | ORAL | Status: DC

## 2013-03-20 NOTE — ED Notes (Signed)
Pt moving furniture Saturday and c/o left shoulder pain all around and posterior shoulder. Pain worse with movement. Took aspirin pta. Nad. No obvious deformity

## 2013-03-20 NOTE — ED Provider Notes (Signed)
Medical screening examination/treatment/procedure(s) were performed by non-physician practitioner and as supervising physician I was immediately available for consultation/collaboration.    Celene Kras, MD 03/20/13 1537

## 2013-03-20 NOTE — ED Provider Notes (Signed)
CSN: [id number]     Arrival date & time 03/20/13  1238 History     First MD Initiated Contact with Patient 03/20/13 1315     Chief Complaint  Patient presents with  . Shoulder Pain   (Consider location/radiation/quality/duration/timing/severity/associated sxs/prior Treatment) Patient is a 62 y.o. female presenting with shoulder pain. The history is provided by the patient.  Shoulder Pain This is a new problem. The current episode started in the past 7 days. The problem occurs daily. The problem has been gradually worsening. Associated symptoms include arthralgias. Pertinent negatives include no abdominal pain, change in bowel habit, chest pain, coughing, fever, neck pain or numbness. Exacerbated by: movement. She has tried NSAIDs for the symptoms. The treatment provided moderate relief.    Past Medical History  Diagnosis Date  . Chronic bronchitis   . Palpitation     Tachycardia reported by monitor clerk during a symptomatic spell  . Chest pain     Admitted to [facility] in 09/2011; refused stress test  . Atrial septal defect 1996    Surgical repair in 1996  . Tobacco abuse     60 pack years; 1.5 packs per day   Past Surgical History  Procedure Laterality Date  . Asd repair  1996    Dr. Andrey Campanile; pericardial patch  . Cholecystectomy     History reviewed. No pertinent family history. History  Substance Use Topics  . Smoking status: Current Every Day Smoker -- 1.50 packs/day for 40 years    Types: Cigarettes  . Smokeless tobacco: Not on file  . Alcohol Use: No   OB History   Grav Para Term Preterm Abortions TAB SAB Ect Mult Living                 Review of Systems  Constitutional: Negative for fever and activity change.       All ROS Neg except as noted in HPI  HENT: Negative for nosebleeds and neck pain.   Eyes: Negative for photophobia and discharge.  Respiratory: Negative for cough, shortness of breath and wheezing.   Cardiovascular: Positive for palpitations. Negative for  chest pain.  Gastrointestinal: Negative for abdominal pain, blood in stool and change in bowel habit.  Genitourinary: Negative for dysuria, frequency and hematuria.  Musculoskeletal: Positive for arthralgias. Negative for back pain.  Skin: Negative.   Neurological: Negative for dizziness, seizures, speech difficulty and numbness.  Psychiatric/Behavioral: Negative for hallucinations and confusion.    Allergies  Codeine  Home Medications   Current Outpatient Rx  Name  Route  Sig  Dispense  Refill  . EXPIRED: albuterol (PROVENTIL HFA;VENTOLIN HFA) 108 (90 BASE) MCG/ACT inhaler   Inhalation   Inhale 2 puffs into the lungs every 4 (four) hours as needed for wheezing.   1 Inhaler   0   . albuterol (PROVENTIL HFA;VENTOLIN HFA) 108 (90 BASE) MCG/ACT inhaler   Inhalation   Inhale 1-2 puffs into the lungs every 6 (six) hours as needed for wheezing.   1 Inhaler   0   . predniSONE (DELTASONE) 50 MG tablet      One tablet PO daily for 4 days   4 tablet   0    BP 151/78  Pulse 76  Temp(Src) 98.1 F (36.7 C) (Oral)  Resp 20  SpO2 98%  LMP 05/20/2011 Physical Exam  Nursing note and vitals reviewed. Constitutional: She is oriented to person, place, and time. She appears well-developed and well-nourished.  Non-toxic appearance.  HENT:  Head: Normocephalic.  Right Ear: Tympanic membrane and external ear normal.  Left Ear: Tympanic membrane and external ear normal.  Eyes: EOM and lids are normal. Pupils are equal, round, and reactive to light.  Neck: Normal range of motion. Neck supple. Carotid bruit is not present.  Cardiovascular: Normal rate, regular rhythm, normal heart sounds, intact distal pulses and normal pulses.   Pulmonary/Chest: Breath sounds normal. No respiratory distress.  Abdominal: Soft. Bowel sounds are normal. There is no tenderness. There is no guarding.  Musculoskeletal:       Right shoulder: She exhibits decreased range of motion, tenderness and crepitus. She  exhibits no swelling, no effusion and no deformity.       Arms: Lymphadenopathy:       Head (right side): No submandibular adenopathy present.       Head (left side): No submandibular adenopathy present.    She has no cervical adenopathy.  Neurological: She is alert and oriented to person, place, and time. She has normal strength. No cranial nerve deficit or sensory deficit.  Skin: Skin is warm and dry.  Psychiatric: She has a normal mood and affect. Her speech is normal.    ED Course   Procedures (including critical care time)  Labs Reviewed - No data to display Dg Shoulder Right  03/20/2013   *RADIOLOGY REPORT*  Clinical Data: Shoulder pain after moving furniture.  RIGHT SHOULDER - 2+ VIEW  Comparison: None.  Findings: Mild acromioclavicular joint degenerative changes otherwise negative plain film exam of the right shoulder.  IMPRESSION: Mild acromioclavicular joint degenerative changes otherwise negative plain film exam of the right shoulder.   Original Report Authenticated By: Lacy Duverney, M.D.   No diagnosis found.  MDM  *I have reviewed nursing notes, vital signs, and all appropriate lab and imaging results for this patient.** xRAY OF THE RIGHT SHOULDER REVEALS DEGENERATIVE CHANGES, BUT NO FX OR DISLOCATION. PLAN - APPLY HEAT TO THE RIGHT SHOULDER. USE CELEBREX BID AND VALIUM TID PRN SPASM PAIN. pT TO FOLLOW UP WITH dR HARRISON IF ANY CHANGES OR PROBLEM.  Kathie Dike, PA-C 03/20/13 1402

## 2013-05-27 ENCOUNTER — Other Ambulatory Visit (HOSPITAL_COMMUNITY): Payer: Self-pay | Admitting: Family Medicine

## 2013-05-27 DIAGNOSIS — N63 Unspecified lump in unspecified breast: Secondary | ICD-10-CM

## 2013-06-11 ENCOUNTER — Encounter (HOSPITAL_COMMUNITY): Payer: Medicare Other

## 2013-07-09 ENCOUNTER — Encounter (HOSPITAL_COMMUNITY): Payer: Self-pay | Admitting: Emergency Medicine

## 2013-07-09 ENCOUNTER — Encounter (HOSPITAL_COMMUNITY): Payer: Medicare Other

## 2013-07-09 ENCOUNTER — Emergency Department (HOSPITAL_COMMUNITY)
Admission: EM | Admit: 2013-07-09 | Discharge: 2013-07-09 | Disposition: A | Discharge status: Home / Self Care | Payer: Medicare Other | Attending: Emergency Medicine | Admitting: Emergency Medicine

## 2013-07-09 DIAGNOSIS — F17200 Nicotine dependence, unspecified, uncomplicated: Secondary | ICD-10-CM | POA: Insufficient documentation

## 2013-07-09 DIAGNOSIS — N63 Unspecified lump in unspecified breast: Secondary | ICD-10-CM | POA: Insufficient documentation

## 2013-07-09 DIAGNOSIS — N6310 Unspecified lump in the right breast, unspecified quadrant: Secondary | ICD-10-CM

## 2013-07-09 DIAGNOSIS — Z791 Long term (current) use of non-steroidal anti-inflammatories (NSAID): Secondary | ICD-10-CM | POA: Insufficient documentation

## 2013-07-09 DIAGNOSIS — Z8774 Personal history of (corrected) congenital malformations of heart and circulatory system: Secondary | ICD-10-CM | POA: Insufficient documentation

## 2013-07-09 DIAGNOSIS — Z8709 Personal history of other diseases of the respiratory system: Secondary | ICD-10-CM | POA: Insufficient documentation

## 2013-07-09 DIAGNOSIS — N6320 Unspecified lump in the left breast, unspecified quadrant: Secondary | ICD-10-CM

## 2013-07-09 MED ORDER — IBUPROFEN 800 MG PO TABS
800.0000 mg | ORAL_TABLET | Freq: Once | ORAL | Status: AC
  Administered 2013-07-09: 800 mg via ORAL
  Filled 2013-07-09: qty 1

## 2013-07-09 MED ORDER — IBUPROFEN 600 MG PO TABS: 600.0000 mg | ORAL_TABLET | Freq: Three times a day (TID) | ORAL | Status: DC | PRN

## 2013-07-09 MED ORDER — OXYCODONE-ACETAMINOPHEN 5-325 MG PO TABS
1.0000 | ORAL_TABLET | Freq: Once | ORAL | Status: AC
  Administered 2013-07-09: 1 via ORAL
  Filled 2013-07-09: qty 1

## 2013-07-09 MED ORDER — OXYCODONE-ACETAMINOPHEN 5-325 MG PO TABS: 1.0000 | ORAL_TABLET | ORAL | Status: DC | PRN

## 2013-07-09 NOTE — ED Notes (Signed)
Pt c/o chest soreness x2 weeks. Pt also reports "two knots" on her chest that she noticed 2 weeks ago. Pt states symptoms began after lifting a heavy tv. Pt describes pain as "sore and achy". Pt denies SOB, dizziness, lightheadedness, diaphoresis, nausea.

## 2013-07-09 NOTE — ED Notes (Signed)
Pt noticed knots on both breast one week ago after lifting a TV. Pt states the knots are painful to touch.

## 2013-07-09 NOTE — ED Provider Notes (Signed)
CSN: [id number]     Arrival date & time 07/09/13  0913 History  This chart was scribed for Lyanne Co, MD by Quintella Reichert, ED scribe.  This patient was seen in room APA11/APA11 and the patient's care was started at 9:32 AM.   Chief Complaint  Patient presents with  . Breast Mass    rt and lt upper breast    The history is provided by the patient. No language interpreter was used.    HPI Comments: Paula Navarro is a 62 y.o. female with h/o fibrocystic breast disease who presents to the Emergency Department complaining of masses in both breasts that she first noticed one week ago.  Pt states that 1-2 weeks ago she was lifting an old heavy television from a table up to her dresser and it fell and rolled back onto her chest.  Sometime since then she noticed 2 "knots" in her breasts.  She complains of moderate associated burning pain to the lump in her left breast but states the one in the right breast is not painful.  She has attempted to treat pain with ibuprofen, without relief.   Past Medical History  Diagnosis Date  . Chronic bronchitis   . Palpitation     Tachycardia reported by monitor clerk during a symptomatic spell  . Chest pain     Admitted to [facility] in 09/2011; refused stress test  . Atrial septal defect 1996    Surgical repair in 1996  . Tobacco abuse     60 pack years; 1.5 packs per day    Past Surgical History  Procedure Laterality Date  . Asd repair  1996    Dr. Andrey Campanile; pericardial patch  . Cholecystectomy      History reviewed. No pertinent family history.   History  Substance Use Topics  . Smoking status: Current Every Day Smoker -- 1.50 packs/day for 40 years    Types: Cigarettes  . Smokeless tobacco: Not on file  . Alcohol Use: No    OB History   Grav Para Term Preterm Abortions TAB SAB Ect Mult Living                  Review of Systems A complete 10 system review of systems was obtained and all systems are negative except as noted in the HPI  and PMH.    Allergies  Codeine  Home Medications   Current Outpatient Rx  Name  Route  Sig  Dispense  Refill  . EXPIRED: albuterol (PROVENTIL HFA;VENTOLIN HFA) 108 (90 BASE) MCG/ACT inhaler   Inhalation   Inhale 2 puffs into the lungs every 4 (four) hours as needed for wheezing.   1 Inhaler   0   . albuterol (PROVENTIL HFA;VENTOLIN HFA) 108 (90 BASE) MCG/ACT inhaler   Inhalation   Inhale 1-2 puffs into the lungs every 6 (six) hours as needed for wheezing.   1 Inhaler   0   . celecoxib (CELEBREX) 100 MG capsule   Oral   Take 1 capsule (100 mg total) by mouth 2 (two) times daily.   14 capsule   0   . diazepam (VALIUM) 5 MG tablet      1 po tid for spasm pain   15 tablet   0   . predniSONE (DELTASONE) 50 MG tablet      One tablet PO daily for 4 days   4 tablet   0    BP 167/73  Pulse 65  Temp(Src) 98.3  F (36.8 C) (Oral)  Resp 17  Ht 5\' 2"  (1.575 m)  Wt 135 lb (61.236 kg)  BMI 24.69 kg/m2  SpO2 98%  LMP 05/20/2011  Physical Exam  Nursing note and vitals reviewed. Constitutional: She is oriented to person, place, and time. She appears well-developed and well-nourished. No distress.  HENT:  Head: Normocephalic and atraumatic.  Eyes: EOM are normal.  Neck: Normal range of motion.  Cardiovascular: Normal rate, regular rhythm and normal heart sounds.   Pulmonary/Chest: Effort normal and breath sounds normal. Right breast exhibits mass. Left breast exhibits mass.  Moderate-sized palpable breast mass at 12 o'clock of the left breast, no overlying skin changes, no abnormalities of the areola.  Tender to palpation.  Left mass more tender than right. Moderate-sized palpable breast mass at 9 o'clock of the right breast, no overlying skin changes, no abnormalities of the areola.  Tender to palpation.  Abdominal: Soft. She exhibits no distension. There is no tenderness.  Musculoskeletal: Normal range of motion.  Neurological: She is alert and oriented to person,  place, and time.  Skin: Skin is warm and dry.  Psychiatric: She has a normal mood and affect. Judgment normal.    ED Course  Procedures (including critical care time)  DIAGNOSTIC STUDIES: Oxygen Saturation is 98% on room air, normal by my interpretation.    COORDINATION OF CARE: 9:43 AM-Discussed treatment plan which includes ice, anti-inflammatories, pain medication, and referral to [facility] with pt at bedside and pt agreed to plan.    Labs Review Labs Reviewed - No data to display  Imaging Review No results found.  EKG Interpretation   None       MDM   1. Breast mass, left   2. Breast mass, right    May represent fibrocystic disease. No signs of infection at this time. Referred to [facility] for definitive imaging. She understands the importance of close follow up    I personally performed the services described in this documentation, which was scribed in my presence. The recorded information has been reviewed and is accurate.      Lyanne Co, MD 07/09/13 [phone]

## 2013-07-15 ENCOUNTER — Other Ambulatory Visit (HOSPITAL_COMMUNITY): Payer: Self-pay | Admitting: Family Medicine

## 2013-07-15 DIAGNOSIS — N63 Unspecified lump in unspecified breast: Secondary | ICD-10-CM

## 2013-07-18 ENCOUNTER — Other Ambulatory Visit: Payer: Medicare Other

## 2013-07-23 ENCOUNTER — Ambulatory Visit
Admission: RE | Admit: 2013-07-23 | Discharge: 2013-07-23 | Disposition: A | Discharge status: Home / Self Care | Payer: Medicare Other | Source: Ambulatory Visit | Attending: Family Medicine | Admitting: Family Medicine

## 2013-07-23 ENCOUNTER — Other Ambulatory Visit (HOSPITAL_COMMUNITY): Payer: Self-pay | Admitting: Family Medicine

## 2013-07-23 DIAGNOSIS — N63 Unspecified lump in unspecified breast: Secondary | ICD-10-CM

## 2013-07-25 ENCOUNTER — Other Ambulatory Visit: Payer: Medicare Other

## 2013-07-30 ENCOUNTER — Other Ambulatory Visit: Payer: Medicare Other

## 2013-07-30 ENCOUNTER — Inpatient Hospital Stay: Admission: RE | Admit: 2013-07-30 | Payer: Medicare Other | Source: Ambulatory Visit

## 2013-07-31 HISTORY — PX: PORT A CATH REVISION: SHX6033

## 2013-08-07 ENCOUNTER — Ambulatory Visit
Admission: RE | Admit: 2013-08-07 | Discharge: 2013-08-07 | Disposition: A | Discharge status: Home / Self Care | Payer: Medicare Other | Source: Ambulatory Visit | Attending: Family Medicine | Admitting: Family Medicine

## 2013-08-07 ENCOUNTER — Other Ambulatory Visit (HOSPITAL_COMMUNITY): Payer: Self-pay | Admitting: Family Medicine

## 2013-08-07 DIAGNOSIS — N63 Unspecified lump in unspecified breast: Secondary | ICD-10-CM

## 2013-08-11 ENCOUNTER — Other Ambulatory Visit: Payer: Self-pay | Admitting: *Deleted

## 2013-08-11 DIAGNOSIS — C50912 Malignant neoplasm of unspecified site of left female breast: Principal | ICD-10-CM

## 2013-08-11 DIAGNOSIS — C50911 Malignant neoplasm of unspecified site of right female breast: Secondary | ICD-10-CM | POA: Insufficient documentation

## 2013-08-13 ENCOUNTER — Encounter: Payer: Self-pay | Admitting: *Deleted

## 2013-08-13 ENCOUNTER — Telehealth: Payer: Self-pay | Admitting: Oncology

## 2013-08-13 ENCOUNTER — Other Ambulatory Visit (INDEPENDENT_AMBULATORY_CARE_PROVIDER_SITE_OTHER): Payer: Self-pay | Admitting: General Surgery

## 2013-08-13 ENCOUNTER — Encounter: Payer: Self-pay | Admitting: Oncology

## 2013-08-13 ENCOUNTER — Ambulatory Visit (HOSPITAL_BASED_OUTPATIENT_CLINIC_OR_DEPARTMENT_OTHER): Payer: Medicare Other | Admitting: Oncology

## 2013-08-13 ENCOUNTER — Ambulatory Visit: Payer: Medicare Other | Attending: General Surgery | Admitting: Physical Therapy

## 2013-08-13 ENCOUNTER — Ambulatory Visit (HOSPITAL_BASED_OUTPATIENT_CLINIC_OR_DEPARTMENT_OTHER): Payer: Medicare Other | Admitting: General Surgery

## 2013-08-13 ENCOUNTER — Other Ambulatory Visit: Payer: Self-pay | Admitting: Emergency Medicine

## 2013-08-13 ENCOUNTER — Ambulatory Visit
Admission: RE | Admit: 2013-08-13 | Discharge: 2013-08-13 | Disposition: A | Discharge status: Home / Self Care | Payer: Medicare Other | Source: Ambulatory Visit | Attending: Radiation Oncology | Admitting: Radiation Oncology

## 2013-08-13 ENCOUNTER — Encounter (INDEPENDENT_AMBULATORY_CARE_PROVIDER_SITE_OTHER): Payer: Self-pay | Admitting: General Surgery

## 2013-08-13 ENCOUNTER — Ambulatory Visit
Admission: RE | Admit: 2013-08-13 | Discharge: 2013-08-13 | Disposition: A | Discharge status: Home / Self Care | Payer: Medicare Other | Source: Ambulatory Visit | Attending: General Surgery | Admitting: General Surgery

## 2013-08-13 ENCOUNTER — Ambulatory Visit: Payer: Medicare Other

## 2013-08-13 ENCOUNTER — Other Ambulatory Visit (HOSPITAL_BASED_OUTPATIENT_CLINIC_OR_DEPARTMENT_OTHER): Payer: Medicare Other

## 2013-08-13 VITALS — BP 181/80 | HR 88 | Temp 98.0°F | Resp 18 | Ht 60.0 in | Wt 131.6 lb

## 2013-08-13 DIAGNOSIS — C773 Secondary and unspecified malignant neoplasm of axilla and upper limb lymph nodes: Secondary | ICD-10-CM

## 2013-08-13 DIAGNOSIS — C50911 Malignant neoplasm of unspecified site of right female breast: Secondary | ICD-10-CM

## 2013-08-13 DIAGNOSIS — N898 Other specified noninflammatory disorders of vagina: Secondary | ICD-10-CM

## 2013-08-13 DIAGNOSIS — C50912 Malignant neoplasm of unspecified site of left female breast: Principal | ICD-10-CM

## 2013-08-13 DIAGNOSIS — C50919 Malignant neoplasm of unspecified site of unspecified female breast: Secondary | ICD-10-CM

## 2013-08-13 DIAGNOSIS — F17200 Nicotine dependence, unspecified, uncomplicated: Secondary | ICD-10-CM

## 2013-08-13 DIAGNOSIS — R293 Abnormal posture: Secondary | ICD-10-CM | POA: Insufficient documentation

## 2013-08-13 DIAGNOSIS — Z170 Estrogen receptor positive status [ER+]: Secondary | ICD-10-CM

## 2013-08-13 DIAGNOSIS — IMO0001 Reserved for inherently not codable concepts without codable children: Secondary | ICD-10-CM | POA: Insufficient documentation

## 2013-08-13 DIAGNOSIS — Z951 Presence of aortocoronary bypass graft: Secondary | ICD-10-CM | POA: Insufficient documentation

## 2013-08-13 DIAGNOSIS — R928 Other abnormal and inconclusive findings on diagnostic imaging of breast: Secondary | ICD-10-CM

## 2013-08-13 DIAGNOSIS — Z720 Tobacco use: Secondary | ICD-10-CM

## 2013-08-13 DIAGNOSIS — R002 Palpitations: Secondary | ICD-10-CM | POA: Insufficient documentation

## 2013-08-13 LAB — CBC WITH DIFFERENTIAL/PLATELET
BASO%: 0.9 % (ref 0.0–2.0)
Basophils Absolute: 0.1 10e3/uL (ref 0.0–0.1)
EOS%: 1.8 % (ref 0.0–7.0)
Eosinophils Absolute: 0.2 10e3/uL (ref 0.0–0.5)
HCT: 45.1 % (ref 34.8–46.6)
HGB: 14.2 g/dL (ref 11.6–15.9)
LYMPH%: 26.2 % (ref 14.0–49.7)
MCH: 22.8 pg — ABNORMAL LOW (ref 25.1–34.0)
MCHC: 31.5 g/dL (ref 31.5–36.0)
MCV: 72.3 fL — ABNORMAL LOW (ref 79.5–101.0)
MONO#: 0.7 10e3/uL (ref 0.1–0.9)
MONO%: 5.1 % (ref 0.0–14.0)
NEUT#: 8.9 10e3/uL — ABNORMAL HIGH (ref 1.5–6.5)
NEUT%: 66 % (ref 38.4–76.8)
Platelets: 261 10e3/uL (ref 145–400)
RBC: 6.24 10e6/uL — ABNORMAL HIGH (ref 3.70–5.45)
RDW: 15 % — ABNORMAL HIGH (ref 11.2–14.5)
WBC: 13.6 10e3/uL — ABNORMAL HIGH (ref 3.9–10.3)
lymph#: 3.6 10e3/uL — ABNORMAL HIGH (ref 0.9–3.3)

## 2013-08-13 LAB — COMPREHENSIVE METABOLIC PANEL (CC13)
ALT: 13 U/L (ref 0–55)
AST: 17 U/L (ref 5–34)
Albumin: 3.9 g/dL (ref 3.5–5.0)
Alkaline Phosphatase: 82 U/L (ref 40–150)
Anion Gap: 11 meq/L (ref 3–11)
BUN: 16.1 mg/dL (ref 7.0–26.0)
CO2: 24 meq/L (ref 22–29)
Calcium: 9.5 mg/dL (ref 8.4–10.4)
Chloride: 105 meq/L (ref 98–109)
Creatinine: 1 mg/dL (ref 0.6–1.1)
Glucose: 132 mg/dL (ref 70–140)
Potassium: 4.4 meq/L (ref 3.5–5.1)
Sodium: 140 meq/L (ref 136–145)
Total Bilirubin: 0.74 mg/dL (ref 0.20–1.20)
Total Protein: 7.6 g/dL (ref 6.4–8.3)

## 2013-08-13 MED ORDER — HYDROMORPHONE HCL 2 MG PO TABS: 2.0000 mg | ORAL_TABLET | Freq: Four times a day (QID) | ORAL | Status: DC | PRN

## 2013-08-13 NOTE — Progress Notes (Signed)
Checked in new patient with no financial issues. She has appt card and I gave her [facility] form. She has medical alert, but could not find card-- she necklace and will let lab know also.

## 2013-08-13 NOTE — Addendum Note (Signed)
Encounter addended by: Thea Silversmith, MD on: 08/13/2013 11:56 AM<BR>     Documentation filed: Notes Section

## 2013-08-13 NOTE — Progress Notes (Addendum)
Radiation Oncology         [phone]) [phone] ________________________________  Initial outpatient Consultation - Date: 08/13/2013   Name: Paula Navarro MRN: [id number]   DOB: [birth year]  REFERRING PHYSICIAN: Merrie Roof, MD  DIAGNOSIS: T2NX(probablyN1) Left Breast Cancer, T2N1 Right Breast Cancer  HISTORY OF PRESENT ILLNESS::Paula Navarro is a 63 y.o. female  who noted bilateral painful breast masses in December of this year. She was sent for further evaluation and had mammograms which showed multiple masses in the left and right breast. The largest in the left breast was 3.5 cm. She had also enlarged left axillary lymph nodes. On the right multiple masses again were noted with a enlarged axillary lymph node. 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 HER-2 was negative on the right as well. The right axillary lymph node was positive as well.Marland Kitchen  PREVIOUS RADIATION THERAPY: No  PAST MEDICAL HISTORY:  has a past medical history of Chronic bronchitis; Palpitation; Chest pain; Atrial septal defect (1996); Tobacco abuse; Anxiety; and Anemia.    PAST SURGICAL HISTORY: Past Surgical History  Procedure Laterality Date  . Cholecystectomy    . Cesarean section    . Vein bypass surgery      FAMILY HISTORY:  Family History  Problem Relation Age of Onset  . Breast cancer Maternal Aunt     SOCIAL HISTORY:  History  Substance Use Topics  . Smoking status: Current Every Day Smoker -- 1.50 packs/day for 40 years    Types: Cigarettes  . Smokeless tobacco: Not on file  . Alcohol Use: No    ALLERGIES: Codeine  MEDICATIONS:  Current Outpatient Prescriptions  Medication Sig Dispense Refill  . albuterol (PROVENTIL HFA;VENTOLIN HFA) 108 (90 BASE) MCG/ACT inhaler Inhale 1-2 puffs into the lungs every 6 (six) hours  as needed for wheezing.  1 Inhaler  0  . celecoxib (CELEBREX) 100 MG capsule Take 1 capsule (100 mg total) by mouth 2 (two) times daily.  14 capsule  0  . diazepam (VALIUM) 5 MG tablet 1 po tid for spasm pain  15 tablet  0  . ibuprofen (ADVIL,MOTRIN) 600 MG tablet Take 1 tablet (600 mg total) by mouth every 8 (eight) hours as needed.  15 tablet  0  . LORazepam (ATIVAN) 1 MG tablet Take 1 mg by mouth every 4 (four) hours as needed for anxiety.      Marland Kitchen oxyCODONE-acetaminophen (PERCOCET/ROXICET) 5-325 MG per tablet Take 1 tablet by mouth every 4 (four) hours as needed for severe pain.  20 tablet  0   No current facility-administered medications for this encounter.    REVIEW OF SYSTEMS:  A 15 point review of systems is documented in the electronic medical record. This was obtained by the nursing staff. However, I reviewed this with the patient to discuss relevant findings and make appropriate changes.  Pertinent items are noted in HPI. She denies any headache, muscle aches or bone pain. Her weight is stable.   PHYSICAL EXAM:  She has bilateral large breast masses. She is painful to palpation on the left breast and has palpable left axillary and right axillary lymph nodes. She has a sebaceous cyst versus a lipoma on her right shoulder. She has no palpable cervical or supraclavicular adenopathy. She is alert and oriented x3.  LABORATORY DATA:  Lab Results  Component Value Date   WBC 13.6* 08/13/2013   HGB 14.2 08/13/2013   HCT 45.1 08/13/2013   MCV 72.3* 08/13/2013   PLT 261 08/13/2013   Lab Results  Component Value Date   NA 140 08/13/2013   K 4.4 08/13/2013   CL 107 09/23/2012   CO2 24 08/13/2013   Lab Results  Component Value Date   ALT 13 08/13/2013   AST 17 08/13/2013   ALKPHOS 82 08/13/2013   BILITOT 0.74 08/13/2013     RADIOGRAPHY: US Breast Bilateral  07/23/2013   CLINICAL DATA:  Bilateral breast masses, both sites tender, more so on the left. There is a history of injury to the breasts  subsequent to the masses and tenderness.  EXAM: DIGITAL DIAGNOSTIC  BILATERAL MAMMOGRAM WITH CAD  ULTRASOUND BILATERAL BREAST  COMPARISON:  11/02/2010  ACR Breast Density Category b: There are scattered areas of fibroglandular density.  FINDINGS: There are bilateral breast masses. On the right, an irregular mass lies in the posterior lateral breast associated with enlarged, rounded and dense axillary lymph nodes. The left, a slightly smaller irregular mass lies in the 11-12 o'clock position. Air is a smaller adjacent cyst, more medially. There are also enlarged, dense axillary lymph nodes.  Mammographic images were processed with CAD.  On physical exam,there are bilateral breast masses corresponding to the mammographic abnormalities, or tender on the left. These are firm and irregular. Axillary adenopathy is palpable on the right.  Ultrasound is performed, showing bilateral breast mass as an axillary adenopathy. On the right, the primary mass lies in the 9 o'clock position 10 cm from the nipple measuring 3.6 cm x 2.6 cm x 3.4 cm. It has lobulated margins. It is markedly hypoechoic with increased through transmission of the sound beam. This is a solid mass. In the right axilla there is a hypoechoic lobulated enlarged lymph node measuring 3.1 cm x 1.9 cm by 2.3 cm. Smaller lymph node with some hilar fat is seen adjacent to this measuring 1.4 cm in greatest dimension. Medial to this, between the upper outer aspect of the right breast and right axilla is and other hypoechoic lobulated mass, lying in the 10 o'clock position approximately 15-20 cm from the nipple. It measures 3.3 cm x 1.5 cm x 2.5 cm.  On the left the dominant mass lies in the 12 o'clock position 5 cm from the nipple. It is hypoechoic with lobulated margins and some increased through transmission of the sound beam. 3.5 cm by 2.3 cm x 2.9 cm. A smaller mass lies adjacent to this measuring 8 mm in size. There is another mass more medial in the 10 o'clock  position, 6 cm from the nipple measures 1 cm in greatest dimension. There is left axillary adenopathy. There are 2 contiguous abnormal nodes measuring 1.4 cm 1.3 cm in greatest dimensions.  IMPRESSION: Bilateral breast masses and bilateral axillary adenopathy highly suspicious for malignancy. This may reflect breast carcinoma with metastases or bilateral synchronous breast carcinomas with axillary metastatic disease. Consider lymphoma in the differential diagnosis.  RECOMMENDATION: Bilateral breast and axillary ultrasound guided core needle biopsy.  I have discussed the findings and recommendations with the patient. Results were also provided in writing at the conclusion of the visit.  BI-RADS CATEGORY  5: Highly suggestive of malignancy - appropriate action should be taken. The biopsy has been scheduled for December 31st, 2015 at 2 p.m.   Electronically Signed   By: Dedra Skeens.D.  On: 07/23/2013 09:39   Mm Digital Diagnostic Bilat  08/07/2013   CLINICAL DATA:  Post biopsy of highly suspicious bilateral breast masses as well as a right axillary lymph node.  EXAM: DIAGNOSTIC BILATERAL MAMMOGRAM POST ULTRASOUND BIOPSY  COMPARISON:  Previous exams  FINDINGS: Mammographic images were obtained following ultrasound guided biopsy of a suspicious mass in the lateral right breast as well as the suspicious mass in the superior left breast. A ribbon shaped biopsy marking clip is present in the targeted region of the right breast mass. A top hat shaped biopsy marking clip is present in the targeted region of the left breast mass.  A clip was not deployed into the biopsied right axillary lymph node.  IMPRESSION: Appropriate positioning of bilateral biopsy marking clips in the breast post ultrasound-guided biopsy of highly suspicious bilateral breast masses.  Final Assessment: Post Procedure Mammograms for Marker Placement   Electronically Signed   By: Everlean Alstrom M.D.   On: 08/07/2013 16:15   Mm Digital Diagnostic  Bilat  07/23/2013   CLINICAL DATA:  Bilateral breast masses, both sites tender, more so on the left. There is a history of injury to the breasts subsequent to the masses and tenderness.  EXAM: DIGITAL DIAGNOSTIC  BILATERAL MAMMOGRAM WITH CAD  ULTRASOUND BILATERAL BREAST  COMPARISON:  11/02/2010  ACR Breast Density Category b: There are scattered areas of fibroglandular density.  FINDINGS: There are bilateral breast masses. On the right, an irregular mass lies in the posterior lateral breast associated with enlarged, rounded and dense axillary lymph nodes. The left, a slightly smaller irregular mass lies in the 11-12 o'clock position. Air is a smaller adjacent cyst, more medially. There are also enlarged, dense axillary lymph nodes.  Mammographic images were processed with CAD.  On physical exam,there are bilateral breast masses corresponding to the mammographic abnormalities, or tender on the left. These are firm and irregular. Axillary adenopathy is palpable on the right.  Ultrasound is performed, showing bilateral breast mass as an axillary adenopathy. On the right, the primary mass lies in the 9 o'clock position 10 cm from the nipple measuring 3.6 cm x 2.6 cm x 3.4 cm. It has lobulated margins. It is markedly hypoechoic with increased through transmission of the sound beam. This is a solid mass. In the right axilla there is a hypoechoic lobulated enlarged lymph node measuring 3.1 cm x 1.9 cm by 2.3 cm. Smaller lymph node with some hilar fat is seen adjacent to this measuring 1.4 cm in greatest dimension. Medial to this, between the upper outer aspect of the right breast and right axilla is and other hypoechoic lobulated mass, lying in the 10 o'clock position approximately 15-20 cm from the nipple. It measures 3.3 cm x 1.5 cm x 2.5 cm.  On the left the dominant mass lies in the 12 o'clock position 5 cm from the nipple. It is hypoechoic with lobulated margins and some increased through transmission of the sound  beam. 3.5 cm by 2.3 cm x 2.9 cm. A smaller mass lies adjacent to this measuring 8 mm in size. There is another mass more medial in the 10 o'clock position, 6 cm from the nipple measures 1 cm in greatest dimension. There is left axillary adenopathy. There are 2 contiguous abnormal nodes measuring 1.4 cm 1.3 cm in greatest dimensions.  IMPRESSION: Bilateral breast masses and bilateral axillary adenopathy highly suspicious for malignancy. This may reflect breast carcinoma with metastases or bilateral synchronous breast carcinomas with axillary metastatic disease. Consider lymphoma  in the differential diagnosis.  RECOMMENDATION: Bilateral breast and axillary ultrasound guided core needle biopsy.  I have discussed the findings and recommendations with the patient. Results were also provided in writing at the conclusion of the visit.  BI-RADS CATEGORY  5: Highly suggestive of malignancy - appropriate action should be taken. The biopsy has been scheduled for December 31st, 2015 at 2 p.m.   Electronically Signed   By: Lajean Manes M.D.   On: 07/23/2013 09:39   [place] Lt Breast Bx W Loc Dev 1st Lesion Img Bx Spec US Guide  08/07/2013   CLINICAL DATA:  Highly suspicious left breast mass.  EXAM: ULTRASOUND GUIDED LEFT BREAST CORE NEEDLE BIOPSY WITH VACUUM ASSIST  COMPARISON:  Previous exams.  PROCEDURE: I met with the patient and we discussed the procedure of ultrasound-guided biopsy, including benefits and alternatives. We discussed the high likelihood of a successful procedure. We discussed the risks of the procedure including infection, bleeding, tissue injury, clip migration, and inadequate sampling. Informed written consent was given. The usual time-out protocol was performed immediately prior to the procedure.  Using sterile technique and 2% Lidocaine as local anesthetic, under direct ultrasound visualization, a 12 gauge vacuum-assisteddevice was used to perform biopsy of the suspicious mass in the superior left breast at  12 o'clock using a lateral approach. At the conclusion of the procedure, a top hat shaped tissue marker clip was deployed into the biopsy cavity. Follow-up 2-view mammogram was performed and dictated separately.  IMPRESSION: Ultrasound-guided biopsy of the suspicious mass in the left breast at 12 o'clock. No apparent complications.   Electronically Signed   By: Everlean Alstrom M.D.   On: 08/07/2013 16:13   [place] Rt Breast Bx W Loc Dev 1st Lesion Img Bx Spec US Guide  08/08/2013   ADDENDUM REPORT: 08/08/2013 11:45  ADDENDUM: Pathology results:  1. Pathology results from the right breast mass revealed invasive mammary carcinoma.  2. Pathology results from the right axillary lymph node revealed metastatic mammary carcinoma.  3. Pathology results from the left breast mass revealed invasive mammary carcinoma.  4. Note that the patient was also supposed to have a left axillary lymph node biopsy which was not done, in part due to patient not being able to tolerate an additional biopsy. The left axillary lymph nodes are also markedly abnormal, most of which appeared subpectoral/interpectoral.  This is concordant with the imaging findings. The patient has been notified of the results. Other than soreness she denies any biopsy site complications.  She has been scheduled to be seen in multidisciplinary Conference on August 13, 2013. A breast MRI will also be scheduled for the patient. The patient has been instructed to call the [place] with any questions or concerns.   Electronically Signed   By: Everlean Alstrom M.D.   On: 08/08/2013 11:45   08/08/2013   CLINICAL DATA:  Highly suspicious right breast mass at 9-10 o'clock.  EXAM: ULTRASOUND GUIDED RIGHT BREAST CORE NEEDLE BIOPSY WITH VACUUM ASSIST  COMPARISON:  Previous exams.  PROCEDURE: I met with the patient and we discussed the procedure of ultrasound-guided biopsy, including benefits and alternatives. We discussed the high likelihood of a successful procedure. We  discussed the risks of the procedure including infection, bleeding, tissue injury, clip migration, and inadequate sampling. Informed written consent was given. The usual time-out protocol was performed immediately prior to the procedure.  Using sterile technique and 2% Lidocaine as local anesthetic, under direct ultrasound visualization, a 12 gauge vacuum-assisteddevice was used  to perform biopsy of the highly suspicious mass in the outer right breast using a lateral approach. At the conclusion of the procedure, a ribbon shaped tissue marker clip was deployed into the biopsy cavity. Follow-up 2-view mammogram was performed and dictated separately.  IMPRESSION: Ultrasound-guided biopsy of a highly suspicious mass in the lateral right breast. Note that this mass appears to involve the adjacent pectoralis muscle.  Electronically Signed: By: Everlean Alstrom M.D. On: 08/07/2013 16:12   [place] Rt Breast Bx W Loc Dev Ea Add Lesion Img Bx Spec US Guide  08/07/2013   CLINICAL DATA:  Right axillary lymphadenopathy.  EXAM: ULTRASOUND GUIDED RIGHT BREAST CORE NEEDLE BIOPSY WITH VACUUM ASSIST  COMPARISON:  Previous exams.  PROCEDURE: I met with the patient and we discussed the procedure of ultrasound-guided biopsy, including benefits and alternatives. We discussed the high likelihood of a successful procedure. We discussed the risks of the procedure including infection, bleeding, tissue injury, clip migration, and inadequate sampling. Informed written consent was given. The usual time-out protocol was performed immediately prior to the procedure.  Using sterile technique and 2% Lidocaine as local anesthetic, under direct ultrasound visualization, a 12 gauge vacuum-assisteddevice was used to perform biopsy of an enlarged and morphologically abnormal right axillary lymph node using a lateral approach.  IMPRESSION: Ultrasound-guided biopsy of an enlarged and morphologically abnormal right axillary lymph node. No apparent  complications.   Electronically Signed   By: Everlean Alstrom M.D.   On: 08/07/2013 15:03      IMPRESSION: Bilateral symptomatic breast masses  PLAN: I spoke with Ms. Roesler briefly today. She has biopsy pending of her left axillary lymph node. She is also going to undergo staging studies and genetics. He is elected for neoadjuvant chemotherapy. I have a high suspicion that she harbors metastatic disease. At this point regardless of whether she chooses to have a mastectomy or lumpectomy she is ultimately able to be converted she would require radiation as I do believe that left axillary lymph node is likely pathologically positive. We'll know more after further biopsy. I discussed with her the process of simulation the placement tattoos. She would like to have her radiation performed at [facility]. Hopefully by that point I will be back radiating up at [facility] and would be able to see her there. We discussed skin redness, darkening, and fatigue as the most common side effects of treatment. We discussed the process of simulation the placement of tattoos. We discussed asymptomatic lung and heart damage.  I spent 20 minutes  face to face with the patient and more than 50% of that time was spent in counseling and/or coordination of care.   ------------------------------------------------  Thea Silversmith, MD

## 2013-08-13 NOTE — Progress Notes (Signed)
Patient ID: Paula Navarro, female   DOB: 1951/07/26, 64 y.o.   MRN: [id number]  No chief complaint on file.   HPI Paula Navarro is a 63 y.o. female.  We are asked to see the patient in consultation by Dr. Sadie Haber to evaluate her for bilateral breast cancer. The patient is a 63 year old black female who recently felt lumps in both breasts right before Thanksgiving. 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 Recently the left side started to experience aching and burning. She was then evaluated with mammogram and was found to have large masses in both breasts. She had an abnormal appearing lymph nodes on both sides. Both breasts were biopsied and both came back as invasive breast cancer. A right-sided lymph node was also biopsied that was positive for cancer. Her left side has not been biopsied yet. She denies any discharge from her nipple. She does not take any female hormones. Both sides were ER positive and PR negative as well as HER-2 negative. Her left-side had a Ki-67 of 80%. Her right side had a Ki-67 of 20%.  HPI  Past Medical History  Diagnosis Date  . Chronic bronchitis   . Palpitation     Tachycardia reported by monitor clerk during a symptomatic spell  . Chest pain     Admitted to [facility] in 09/2011; refused stress test  . Atrial septal defect 1996    Surgical repair in 1996  . Tobacco abuse     60 pack years; 1.5 packs per day  . Anxiety   . Anemia     Past Surgical History  Procedure Laterality Date  . Cholecystectomy    . Cesarean section    . Vein bypass surgery      Family History  Problem Relation Age of Onset  . Breast cancer Maternal Aunt     Social History History  Substance Use Topics  . Smoking status: Current Every Day Smoker -- 1.50 packs/day for 40 years    Types: Cigarettes  . Smokeless tobacco: Not on file  . Alcohol Use: No    Allergies  Allergen Reactions  . Codeine Other (See Comments)    Hot in chest    Current  Outpatient Prescriptions  Medication Sig Dispense Refill  . albuterol (PROVENTIL HFA;VENTOLIN HFA) 108 (90 BASE) MCG/ACT inhaler Inhale 1-2 puffs into the lungs every 6 (six) hours as needed for wheezing.  1 Inhaler  0  . celecoxib (CELEBREX) 100 MG capsule Take 1 capsule (100 mg total) by mouth 2 (two) times daily.  14 capsule  0  . diazepam (VALIUM) 5 MG tablet 1 po tid for spasm pain  15 tablet  0  . ibuprofen (ADVIL,MOTRIN) 600 MG tablet Take 1 tablet (600 mg total) by mouth every 8 (eight) hours as needed.  15 tablet  0  . LORazepam (ATIVAN) 1 MG tablet Take 1 mg by mouth every 4 (four) hours as needed for anxiety.      Marland Kitchen oxyCODONE-acetaminophen (PERCOCET/ROXICET) 5-325 MG per tablet Take 1 tablet by mouth every 4 (four) hours as needed for severe pain.  20 tablet  0   No current facility-administered medications for this visit.    Review of Systems Review of Systems  Constitutional: Negative.   HENT: Negative.   Eyes: Negative.   Respiratory: Negative.   Cardiovascular: Negative.   Gastrointestinal: Negative.   Endocrine: Negative.   Genitourinary: Negative.   Musculoskeletal: Negative.   Skin: Negative.   Allergic/Immunologic:  Negative.   Neurological: Negative.   Hematological: Negative.   Psychiatric/Behavioral: Negative.     Last menstrual period 05/20/2011.  Physical Exam Physical Exam  Constitutional: She is oriented to person, place, and time. She appears well-developed and well-nourished.  HENT:  Head: Normocephalic and atraumatic.  Eyes: Conjunctivae and EOM are normal. Pupils are equal, round, and reactive to light.  Neck: Normal range of motion. Neck supple.  Cardiovascular: Normal rate, regular rhythm and normal heart sounds.   Pulmonary/Chest: Effort normal and breath sounds normal.  There is a large palpable mobile 4-5 cm mass in the upper outer right breast. There is also a large palpable 4 cm mass in the upper inner left breast. There is palpably  enlarged lymphadenopathy in both axillas that is mobile  Abdominal: Soft. Bowel sounds are normal.  Musculoskeletal: Normal range of motion.  Lymphadenopathy:    She has no cervical adenopathy.  Neurological: She is alert and oriented to person, place, and time.  Skin: Skin is warm and dry.  Psychiatric: She has a normal mood and affect. Her behavior is normal.    Data Reviewed As above  Assessment    The patient appears to have locally advanced cancers in both breasts. She will need to have the abnormal lymph node in the left axilla biopsied. She will also need neoadjuvant chemotherapy. We have talked her in detail about her options for treatment and she is agreeable to this plan. She will also require a Port-A-Cath for her chemotherapy. I discussed with her in detail the risk and benefits of the operation placed the Port-A-Cath as well as some of the technical aspects and she understands and wishes to proceed     Plan    Plan for Port-A-Cath placement        [name],Amiree No S 08/13/2013, 11:19 AM

## 2013-08-13 NOTE — Progress Notes (Signed)
Paula Navarro [id number] Jun 19, 1951 63 y.o. 08/13/2013 1:59 PM  CC  Lanette Hampshire, MD [place] [address] Dr. Thea Silversmith Dr. Autumn Messing  REASON FOR CONSULTATION:  63 year old ER positive breast cancers. Patient is seen in the multidisciplinary breast clinic for discussion of treatment options  STAGE:   Bilateral breast cancer   Primary site: Breast (Bilateral)   Staging method: AJCC 7th Edition   Clinical: Stage IIB (T2, N1, cM0)   Summary: Stage IIB (T2, N1, cM0)  REFERRING PHYSICIAN: Dr. Autumn Messing  HISTORY OF PRESENT ILLNESS:  Paula Navarro is a 63 y.o. female.  Medical history significant for atrial septal defect tobacco abuse and history tachycardia. Patient states that she felt pain in her breasts and masses. Because of this she went on to have further workup performed including mammograms and ultrasound. On 07/23/2013 patient had bilateral diagnostic mammogram she was noted to have bilateral breast masses on the right irregular mass lying posterior lateral breast associated with a large rounded and dense axillary nodes. The left showed slightly smaller irregular mass lying at the 11:00 to 12:00 position. There was a smaller adjacent cyst. Also noted to have a large dense axillary lymph nodes on the left. Because of this she was recommended ultrasound and biopsies. 08/07/2013 patient underwent ultrasound-guided right breast core needle biopsy will. As well as left eye out see. 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 The lymph node was positive for metastatic disease. On the left patient had a biopsy that also showed invasive ductal carcinoma grade 3 ER positive PR negative HER-2/neu negative with a proliferation marker Ki-67 80%. Lymph node was not biopsied. Her clinical and radiologic staging was T2 NX on the left side T2 N1 on the right  side. Patient does have pain in the left breast at the 12:00 position. She's been taking oxycodone for this which is not helping. She is requesting something stronger and I gave her a prescription for dilated. She did, alone. Although her son did want to coming to the examination room she declined his presence. She was seen in the multidisciplinary breast clinic with Dr. Autumn Messing and Dr. Thea Silversmith. Her radiology and pathology were discussed at the multidisciplinary breast conference.   Past Medical History: Past Medical History  Diagnosis Date  . Chronic bronchitis   . Palpitation     Tachycardia reported by monitor clerk during a symptomatic spell  . Chest pain     Admitted to [facility] in 09/2011; refused stress test  . Atrial septal defect 1996    Surgical repair in 1996  . Tobacco abuse     60 pack years; 1.5 packs per day  . Anxiety   . Anemia     Past Surgical History: Past Surgical History  Procedure Laterality Date  . Cholecystectomy    . Cesarean section    . Vein bypass surgery      Family History: Family History  Problem Relation Age of Onset  . Breast cancer Maternal Aunt     Social History History  Substance Use Topics  . Smoking status: Current Every Day Smoker -- 1.50 packs/day for 40 years    Types: Cigarettes  . Smokeless tobacco: Never Used  . Alcohol Use: No    Allergies: Allergies  Allergen Reactions  . Codeine Other (See Comments)    Hot in chest    Current  Medications: Current Outpatient Prescriptions  Medication Sig Dispense Refill  . LORazepam (ATIVAN) 1 MG tablet Take 1 mg by mouth every 4 (four) hours as needed for anxiety.      Marland Kitchen oxyCODONE-acetaminophen (PERCOCET/ROXICET) 5-325 MG per tablet Take 1 tablet by mouth every 4 (four) hours as needed for severe pain.  20 tablet  0  . albuterol (PROVENTIL HFA;VENTOLIN HFA) 108 (90 BASE) MCG/ACT inhaler Inhale 1-2 puffs into the lungs every 6 (six) hours as needed for wheezing.  1 Inhaler  0   . celecoxib (CELEBREX) 100 MG capsule Take 1 capsule (100 mg total) by mouth 2 (two) times daily.  14 capsule  0  . diazepam (VALIUM) 5 MG tablet 1 po tid for spasm pain  15 tablet  0  . HYDROmorphone (DILAUDID) 2 MG tablet Take 1 tablet (2 mg total) by mouth every 6 (six) hours as needed for severe pain.  30 tablet  0  . ibuprofen (ADVIL,MOTRIN) 600 MG tablet Take 1 tablet (600 mg total) by mouth every 8 (eight) hours as needed.  15 tablet  0   No current facility-administered medications for this visit.    OB/GYN History: Menarche age 69 she tells me that she is still spotting now. So therefore she has had abnormal uterine bleeding. She has not been on hormone replacement therapy first live birth the 17th. She has a son and her daughter  Fertility Discussion: Not applicable Prior History of Cancer: No  Health Maintenance:  Colonoscopy no no Bone Density no Last PAP smear 2012  ECOG PERFORMANCE STATUS: 1 - Symptomatic but completely ambulatory  Genetic Counseling/testing: Patient will be referred to genetic counseling and testing  REVIEW OF SYSTEMS:  A comprehensive 14 point review of systems was obtained and it is can separately into the electronic medical record  PHYSICAL EXAMINATION: Blood pressure 181/80, pulse 88, temperature 98 F (36.7 C), temperature source Oral, resp. rate 18, height 5' (1.524 m), weight 131 lb 9.6 oz (59.693 kg), last menstrual period 05/20/2011.  GNF:AOZHY, healthy, no distress, well nourished and well developed SKIN: skin color, texture, turgor are normal HEAD: Normocephalic EYES: PERRLA, EOMI, Conjunctiva are pink and non-injected EARS: External ears normal OROPHARYNX:no exudate, no erythema and lips, buccal mucosa, and tongue normal  NECK: no adenopathy LYMPH:  Bilateral palpable nodes in the axilla BREAST:abnormal mass palpable in both breasts as well as in the axilla. LUNGS: clear to auscultation  HEART: regular rate &  rhythm ABDOMEN:abdomen soft, non-tender, normal bowel sounds and no masses or organomegaly BACK: Back symmetric, no curvature. EXTREMITIES:no edema, no clubbing, no cyanosis  NEURO: alert & oriented x 3 with fluent speech, no focal motor/sensory deficits, gait normal, reflexes normal and symmetric     STUDIES/RESULTS: US Breast Bilateral  07/23/2013   CLINICAL DATA:  Bilateral breast masses, both sites tender, more so on the left. There is a history of injury to the breasts subsequent to the masses and tenderness.  EXAM: DIGITAL DIAGNOSTIC  BILATERAL MAMMOGRAM WITH CAD  ULTRASOUND BILATERAL BREAST  COMPARISON:  11/02/2010  ACR Breast Density Category b: There are scattered areas of fibroglandular density.  FINDINGS: There are bilateral breast masses. On the right, an irregular mass lies in the posterior lateral breast associated with enlarged, rounded and dense axillary lymph nodes. The left, a slightly smaller irregular mass lies in the 11-12 o'clock position. Air is a smaller adjacent cyst, more medially. There are also enlarged, dense axillary lymph nodes.  Mammographic images were processed with CAD.  On physical exam,there are bilateral breast masses corresponding to the mammographic abnormalities, or tender on the left. These are firm and irregular. Axillary adenopathy is palpable on the right.  Ultrasound is performed, showing bilateral breast mass as an axillary adenopathy. On the right, the primary mass lies in the 9 o'clock position 10 cm from the nipple measuring 3.6 cm x 2.6 cm x 3.4 cm. It has lobulated margins. It is markedly hypoechoic with increased through transmission of the sound beam. This is a solid mass. In the right axilla there is a hypoechoic lobulated enlarged lymph node measuring 3.1 cm x 1.9 cm by 2.3 cm. Smaller lymph node with some hilar fat is seen adjacent to this measuring 1.4 cm in greatest dimension. Medial to this, between the upper outer aspect of the right breast and  right axilla is and other hypoechoic lobulated mass, lying in the 10 o'clock position approximately 15-20 cm from the nipple. It measures 3.3 cm x 1.5 cm x 2.5 cm.  On the left the dominant mass lies in the 12 o'clock position 5 cm from the nipple. It is hypoechoic with lobulated margins and some increased through transmission of the sound beam. 3.5 cm by 2.3 cm x 2.9 cm. A smaller mass lies adjacent to this measuring 8 mm in size. There is another mass more medial in the 10 o'clock position, 6 cm from the nipple measures 1 cm in greatest dimension. There is left axillary adenopathy. There are 2 contiguous abnormal nodes measuring 1.4 cm 1.3 cm in greatest dimensions.  IMPRESSION: Bilateral breast masses and bilateral axillary adenopathy highly suspicious for malignancy. This may reflect breast carcinoma with metastases or bilateral synchronous breast carcinomas with axillary metastatic disease. Consider lymphoma in the differential diagnosis.  RECOMMENDATION: Bilateral breast and axillary ultrasound guided core needle biopsy.  I have discussed the findings and recommendations with the patient. Results were also provided in writing at the conclusion of the visit.  BI-RADS CATEGORY  5: Highly suggestive of malignancy - appropriate action should be taken. The biopsy has been scheduled for December 31st, 2015 at 2 p.m.   Electronically Signed   By: Lajean Manes M.D.   On: 07/23/2013 09:39   Mm Digital Diagnostic Bilat  08/07/2013   CLINICAL DATA:  Post biopsy of highly suspicious bilateral breast masses as well as a right axillary lymph node.  EXAM: DIAGNOSTIC BILATERAL MAMMOGRAM POST ULTRASOUND BIOPSY  COMPARISON:  Previous exams  FINDINGS: Mammographic images were obtained following ultrasound guided biopsy of a suspicious mass in the lateral right breast as well as the suspicious mass in the superior left breast. A ribbon shaped biopsy marking clip is present in the targeted region of the right breast mass. A top  hat shaped biopsy marking clip is present in the targeted region of the left breast mass.  A clip was not deployed into the biopsied right axillary lymph node.  IMPRESSION: Appropriate positioning of bilateral biopsy marking clips in the breast post ultrasound-guided biopsy of highly suspicious bilateral breast masses.  Final Assessment: Post Procedure Mammograms for Marker Placement   Electronically Signed   By: Everlean Alstrom M.D.   On: 08/07/2013 16:15   Mm Digital Diagnostic Bilat  07/23/2013   CLINICAL DATA:  Bilateral breast masses, both sites tender, more so on the left. There is a history of injury to the breasts subsequent to the masses and tenderness.  EXAM: DIGITAL DIAGNOSTIC  BILATERAL MAMMOGRAM WITH CAD  ULTRASOUND BILATERAL BREAST  COMPARISON:  11/02/2010  ACR Breast Density Category b: There are scattered areas of fibroglandular density.  FINDINGS: There are bilateral breast masses. On the right, an irregular mass lies in the posterior lateral breast associated with enlarged, rounded and dense axillary lymph nodes. The left, a slightly smaller irregular mass lies in the 11-12 o'clock position. Air is a smaller adjacent cyst, more medially. There are also enlarged, dense axillary lymph nodes.  Mammographic images were processed with CAD.  On physical exam,there are bilateral breast masses corresponding to the mammographic abnormalities, or tender on the left. These are firm and irregular. Axillary adenopathy is palpable on the right.  Ultrasound is performed, showing bilateral breast mass as an axillary adenopathy. On the right, the primary mass lies in the 9 o'clock position 10 cm from the nipple measuring 3.6 cm x 2.6 cm x 3.4 cm. It has lobulated margins. It is markedly hypoechoic with increased through transmission of the sound beam. This is a solid mass. In the right axilla there is a hypoechoic lobulated enlarged lymph node measuring 3.1 cm x 1.9 cm by 2.3 cm. Smaller lymph node with some  hilar fat is seen adjacent to this measuring 1.4 cm in greatest dimension. Medial to this, between the upper outer aspect of the right breast and right axilla is and other hypoechoic lobulated mass, lying in the 10 o'clock position approximately 15-20 cm from the nipple. It measures 3.3 cm x 1.5 cm x 2.5 cm.  On the left the dominant mass lies in the 12 o'clock position 5 cm from the nipple. It is hypoechoic with lobulated margins and some increased through transmission of the sound beam. 3.5 cm by 2.3 cm x 2.9 cm. A smaller mass lies adjacent to this measuring 8 mm in size. There is another mass more medial in the 10 o'clock position, 6 cm from the nipple measures 1 cm in greatest dimension. There is left axillary adenopathy. There are 2 contiguous abnormal nodes measuring 1.4 cm 1.3 cm in greatest dimensions.  IMPRESSION: Bilateral breast masses and bilateral axillary adenopathy highly suspicious for malignancy. This may reflect breast carcinoma with metastases or bilateral synchronous breast carcinomas with axillary metastatic disease. Consider lymphoma in the differential diagnosis.  RECOMMENDATION: Bilateral breast and axillary ultrasound guided core needle biopsy.  I have discussed the findings and recommendations with the patient. Results were also provided in writing at the conclusion of the visit.  BI-RADS CATEGORY  5: Highly suggestive of malignancy - appropriate action should be taken. The biopsy has been scheduled for December 31st, 2015 at 2 p.m.   Electronically Signed   By: Lajean Manes M.D.   On: 07/23/2013 09:39   [place] Lt Breast Bx W Loc Dev 1st Lesion Img Bx Spec US Guide  08/07/2013   CLINICAL DATA:  Highly suspicious left breast mass.  EXAM: ULTRASOUND GUIDED LEFT BREAST CORE NEEDLE BIOPSY WITH VACUUM ASSIST  COMPARISON:  Previous exams.  PROCEDURE: I met with the patient and we discussed the procedure of ultrasound-guided biopsy, including benefits and alternatives. We discussed the high  likelihood of a successful procedure. We discussed the risks of the procedure including infection, bleeding, tissue injury, clip migration, and inadequate sampling. Informed written consent was given. The usual time-out protocol was performed immediately prior to the procedure.  Using sterile technique and 2% Lidocaine as local anesthetic, under direct ultrasound visualization, a 12 gauge vacuum-assisteddevice was used to perform biopsy of the suspicious mass in the superior left breast at 12 o'clock using a lateral approach.  At the conclusion of the procedure, a top hat shaped tissue marker clip was deployed into the biopsy cavity. Follow-up 2-view mammogram was performed and dictated separately.  IMPRESSION: Ultrasound-guided biopsy of the suspicious mass in the left breast at 12 o'clock. No apparent complications.   Electronically Signed   By: Everlean Alstrom M.D.   On: 08/07/2013 16:13   [place] Rt Breast Bx W Loc Dev 1st Lesion Img Bx Spec US Guide  08/08/2013   ADDENDUM REPORT: 08/08/2013 11:45  ADDENDUM: Pathology results:  1. Pathology results from the right breast mass revealed invasive mammary carcinoma.  2. Pathology results from the right axillary lymph node revealed metastatic mammary carcinoma.  3. Pathology results from the left breast mass revealed invasive mammary carcinoma.  4. Note that the patient was also supposed to have a left axillary lymph node biopsy which was not done, in part due to patient not being able to tolerate an additional biopsy. The left axillary lymph nodes are also markedly abnormal, most of which appeared subpectoral/interpectoral.  This is concordant with the imaging findings. The patient has been notified of the results. Other than soreness she denies any biopsy site complications.  She has been scheduled to be seen in multidisciplinary Conference on August 13, 2013. A breast MRI will also be scheduled for the patient. The patient has been instructed to call the [facility] with any questions or concerns.   Electronically Signed   By: Everlean Alstrom M.D.   On: 08/08/2013 11:45   08/08/2013   CLINICAL DATA:  Highly suspicious right breast mass at 9-10 o'clock.  EXAM: ULTRASOUND GUIDED RIGHT BREAST CORE NEEDLE BIOPSY WITH VACUUM ASSIST  COMPARISON:  Previous exams.  PROCEDURE: I met with the patient and we discussed the procedure of ultrasound-guided biopsy, including benefits and alternatives. We discussed the high likelihood of a successful procedure. We discussed the risks of the procedure including infection, bleeding, tissue injury, clip migration, and inadequate sampling. Informed written consent was given. The usual time-out protocol was performed immediately prior to the procedure.  Using sterile technique and 2% Lidocaine as local anesthetic, under direct ultrasound visualization, a 12 gauge vacuum-assisteddevice was used to perform biopsy of the highly suspicious mass in the outer right breast using a lateral approach. At the conclusion of the procedure, a ribbon shaped tissue marker clip was deployed into the biopsy cavity. Follow-up 2-view mammogram was performed and dictated separately.  IMPRESSION: Ultrasound-guided biopsy of a highly suspicious mass in the lateral right breast. Note that this mass appears to involve the adjacent pectoralis muscle.  Electronically Signed: By: Everlean Alstrom M.D. On: 08/07/2013 16:12   [place] Rt Breast Bx W Loc Dev Ea Add Lesion Img Bx Spec US Guide  08/07/2013   CLINICAL DATA:  Right axillary lymphadenopathy.  EXAM: ULTRASOUND GUIDED RIGHT BREAST CORE NEEDLE BIOPSY WITH VACUUM ASSIST  COMPARISON:  Previous exams.  PROCEDURE: I met with the patient and we discussed the procedure of ultrasound-guided biopsy, including benefits and alternatives. We discussed the high likelihood of a successful procedure. We discussed the risks of the procedure including infection, bleeding, tissue injury, clip migration, and inadequate sampling.  Informed written consent was given. The usual time-out protocol was performed immediately prior to the procedure.  Using sterile technique and 2% Lidocaine as local anesthetic, under direct ultrasound visualization, a 12 gauge vacuum-assisteddevice was used to perform biopsy of an enlarged and morphologically abnormal right axillary lymph node using a lateral approach.  IMPRESSION: Ultrasound-guided biopsy of  an enlarged and morphologically abnormal right axillary lymph node. No apparent complications.   Electronically Signed   By: Everlean Alstrom M.D.   On: 08/07/2013 15:03     LABS:    Chemistry      Component Value Date/Time   NA 140 08/13/2013 0840   NA 141 09/23/2012 0414   K 4.4 08/13/2013 0840   K 3.8 09/23/2012 0414   CL 107 09/23/2012 0414   CO2 24 08/13/2013 0840   CO2 29 10/30/2011 1419   BUN 16.1 08/13/2013 0840   BUN 7 09/23/2012 0414   CREATININE 1.0 08/13/2013 0840   CREATININE 0.90 09/23/2012 0414      Component Value Date/Time   CALCIUM 9.5 08/13/2013 0840   CALCIUM 10.1 10/30/2011 1419   ALKPHOS 82 08/13/2013 0840   AST 17 08/13/2013 0840   ALT 13 08/13/2013 0840   BILITOT 0.74 08/13/2013 0840      Lab Results  Component Value Date   WBC 13.6* 08/13/2013   HGB 14.2 08/13/2013   HCT 45.1 08/13/2013   MCV 72.3* 08/13/2013   PLT 261 08/13/2013    ASSESSMENT    7-36year-old female with new diagnosis of bilateral breast cancers. Pathology is noted above. She presented with a palpable mass is with tenderness. Axilla are positive on the right side for metastatic disease. Patient's tumor on the left is invasive ductal carcinoma grade 3 ER positive PR negative HER-2/neu negative with an elevated proliferation marker Ki-67 of 80%. On the right it is also invasive ductal carcinoma grade 2 ER positive PR negative HER-2/neu negative with a proliferation marker Ki-67 20%.  Patient and I discussed the pathophysiology of breast cancer, we discussed treatment options, we discussed her pathology  in particular. I went over the pathology and radiology in detail today. We also discussed treatment options. Patient understands that she up front might need a mastectomy however if she desires breast conservation she would be a good candidate for neoadjuvant/preop chemotherapy. We discussed the different chemotherapeutic agents including utilizing 5-FU epirubicin Cytoxan every 2 weeks for a total of 6 cycles followed by Taxotere every 2 weeks for a total of 4 cycles. When she completes this she will go on to get her definitive surgery. We discussed in detail side effects of chemotherapy including but not limited to cardiac toxicity from epirubicin hair loss nausea vomiting discussed perform neuropathy from Taxotere. Once patient completes chemotherapy she will go on to her definitive surgery. Hopefully this can be a lumpectomy. Certainly she would need lymph node dissection. Because her tumor is ER positive she will be a good candidate for antiestrogen therapy most likely with an aromatase inhibitor. Patient will need Port-A-Cath placement, chemotherapy teaching class as well as as an echocardiogram.  Due to the extent of disease I did recommend setting the patient up for staging studies including doing a PET/CT. Patient does have ongoing vaginal bleeding and spotting unclear etiology I do think she does need an ultrasound for further evaluation.  We discussed the role of genetic and she is agreeable to going for genetic counseling and testing.  Patient is an every day smoker I did discuss with her smoking cessation and literature was given to her.   PLAN:    #1 genetic counseling  #2 neoadjuvant chemotherapy consisting of FEC every 2 weeks for a total of 6 cycles with G-CSF support she will also follow this with Taxotere every 2 weeks 4 cycles with G-CSF support.  #3 she will need staging studies  including a PET/CT.  #4 will need a Port-A-Cath placed chemotherapy teaching class and an  echocardiogram.  #5 patient will need vaginal ultrasound for further evaluation of spotting.  #6 smoking cessation was discussed at       Discussion: Patient is being treated per NCCN breast cancer care guidelines appropriate for stage.II   Thank you so much for allowing me to participate in the care of [name]. I will continue to follow up the patient with you and assist in her care.  All questions were answered. The patient knows to call the clinic with any problems, questions or concerns. We can certainly see the patient much sooner if necessary.  I spent 55 minutes counseling the patient face to face. The total time spent in the appointment was 60 minutes.  Marcy Panning, MD Medical/Oncology [facility] [phone] (beeper) [phone] (Office)  08/13/2013, 1:59 PM

## 2013-08-13 NOTE — Addendum Note (Signed)
Addended by: Luella Cook III on: 08/13/2013 11:45 AM   Modules accepted: Orders

## 2013-08-13 NOTE — Telephone Encounter (Signed)
called pt re appts. not able to reach pt or lm - vm not set up. called cell and s/w grandson re appt for 1/21 @ 10am. schedule mailed.

## 2013-08-13 NOTE — Progress Notes (Signed)
[place] Psychosocial Distress Screening Clinical Social Work  Patient completed distress screening protocol, and patient scored a 0 on the Psychosocial Distress Thermometer which indicates no distress. Clinical Social Worker met with pt in [facility] to assess for distress and other psychosocial needs.  Pt stated she had a "strong faith" and felt confident in her treatment plan.  CSW informed pt of the support team and support services at [facility].  CSW encouraged pt to call with any questions or concerns.  Johnnye Lana, MSW, [place] Worker [facility] [phone]

## 2013-08-14 ENCOUNTER — Encounter: Payer: Self-pay | Admitting: Oncology

## 2013-08-14 ENCOUNTER — Other Ambulatory Visit: Payer: Self-pay | Admitting: *Deleted

## 2013-08-14 DIAGNOSIS — C50912 Malignant neoplasm of unspecified site of left female breast: Principal | ICD-10-CM

## 2013-08-14 DIAGNOSIS — C50911 Malignant neoplasm of unspecified site of right female breast: Secondary | ICD-10-CM

## 2013-08-14 MED ORDER — LORAZEPAM 1 MG PO TABS: 1.0000 mg | ORAL_TABLET | Freq: Four times a day (QID) | ORAL | Status: DC | PRN

## 2013-08-14 NOTE — Progress Notes (Signed)
MRI bilateral breast @ GI 08/18/13 auth# [id number], echo auth # [id number]

## 2013-08-15 ENCOUNTER — Telehealth: Payer: Self-pay | Admitting: Oncology

## 2013-08-15 NOTE — Telephone Encounter (Signed)
called pt w/echo appt. not able reach pt by phone or lm - vm not set up. schedule mailed and comment added to 1/21 appt to send pt for new schedule. preauth#[id number]

## 2013-08-18 ENCOUNTER — Inpatient Hospital Stay: Admission: RE | Admit: 2013-08-18 | Payer: Medicare Other | Source: Ambulatory Visit

## 2013-08-18 ENCOUNTER — Other Ambulatory Visit: Payer: Medicare Other

## 2013-08-19 ENCOUNTER — Telehealth: Payer: Self-pay | Admitting: *Deleted

## 2013-08-19 NOTE — Telephone Encounter (Signed)
Received notification from [facility] that pt needs to have genetic appt rescheduled due to having PORT placed this day.  Called pt and confirmed 10/20/13 gentic appt.  Informed her that we would print her a new calendar when she came in for her visit and this appt would be on it.

## 2013-08-19 NOTE — Telephone Encounter (Signed)
Unable to reach patient her voicemail box was full and unable to leave message.

## 2013-08-20 ENCOUNTER — Other Ambulatory Visit: Payer: Medicare Other

## 2013-08-20 ENCOUNTER — Telehealth: Payer: Self-pay | Admitting: Oncology

## 2013-08-20 ENCOUNTER — Telehealth: Payer: Self-pay | Admitting: Emergency Medicine

## 2013-08-20 NOTE — Telephone Encounter (Signed)
, °

## 2013-08-20 NOTE — Telephone Encounter (Signed)
Patient called this office requesting to speak to a nurse. Attempted to return patient's call twice. No answer. No voicemail set up.

## 2013-08-21 ENCOUNTER — Telehealth (INDEPENDENT_AMBULATORY_CARE_PROVIDER_SITE_OTHER): Payer: Self-pay | Admitting: General Surgery

## 2013-08-21 ENCOUNTER — Inpatient Hospital Stay (HOSPITAL_COMMUNITY)
Admission: RE | Admit: 2013-08-21 | Discharge: 2013-08-21 | Disposition: A | Discharge status: Home / Self Care | Payer: Medicare Other | Source: Ambulatory Visit

## 2013-08-21 ENCOUNTER — Telehealth: Payer: Self-pay | Admitting: *Deleted

## 2013-08-21 NOTE — Telephone Encounter (Signed)
Pt had pac insert scheduled for 08/25/13 she cancelled stating too many drs appts at too many different places.. She also stated she wanted to move before she stated any chemo

## 2013-08-21 NOTE — Pre-Procedure Instructions (Addendum)
Paula Navarro  08/21/2013   Your procedure is scheduled on:  August 25, 2013  Report to [facility] Entrance A at 8 AM.  Call this number if you have problems the morning of surgery: [phone]   Remember:   STOP NSAIDS ex: advil, ibuprofen, STOP celebrex   Do not eat food or drink liquids after midnight.   Take these medicines the morning of surgery with A SIP OF WATER: albuterol (PROVENTIL  HFA;VENTOLIN HFA) as needed, diazepam (VALIUM), pain medication as needed, LORazepam  (ATIVAN)   Do not wear jewelry, make-up or nail polish.  Do not wear lotions, powders, or perfumes. You may wear deodorant.  Do not shave 48 hours prior to surgery. Men may shave face and neck.  Do not bring valuables to the hospital.  [facility] is not responsible  for any belongings or valuables.               Contacts, dentures or bridgework may not be worn into surgery.  Leave suitcase in the car. After surgery it may be brought to your room.  For patients admitted to the hospital, discharge time is determined by your                treatment team.               Patients discharged the day of surgery will not be allowed to drive  home.  Name and phone number of your driver:   Special Instructions: Shower using CHG 2 nights before surgery and the night before surgery.  If you shower the day of surgery use CHG.  Use special wash - you have one bottle of CHG for all showers.  You should use approximately 1/3 of the bottle for each shower.   Please read over the following fact sheets that you were given: Pain Booklet, Coughing and Deep Breathing and Surgical Site Infection Prevention

## 2013-08-21 NOTE — Telephone Encounter (Signed)
Rec'd call from Radiology this morning that when patient was called to confirm appts for tomorrow, she said they were all cancelled! Called and spoke to patient for a while and patient was confused on who was calling. She thought she was canceling her pre-surg testing for port placement today. She said she really can not get this done at this time because she is moving the first week in February and can not be limited physically and she was told she would not be able to lift anything. "I just can't do that right now, and that surgeon didn't make me feel good about that". Continued to talk to patient and explain her plan of care. She now understands to come to [facility] for staging test on 08/22/13 starting with ECHO at 1200noon. However, Dr Humphrey Rolls would like for the Navigator to talk to patient in more detail about the port and plan to start chemo on 09/01/2013 versus waiting another week as patient feels she needs to do. Once we can gather all information for decisions on port placement and chemo, we will finalize with patient. Shajuan understands we will call her back and discuss this with her.

## 2013-08-22 ENCOUNTER — Encounter (HOSPITAL_COMMUNITY)
Admission: RE | Admit: 2013-08-22 | Discharge: 2013-08-22 | Disposition: A | Discharge status: Home / Self Care | Payer: Medicare Other | Source: Ambulatory Visit | Attending: Oncology | Admitting: Oncology

## 2013-08-22 ENCOUNTER — Ambulatory Visit (HOSPITAL_COMMUNITY)
Admission: RE | Admit: 2013-08-22 | Discharge: 2013-08-22 | Disposition: A | Discharge status: Home / Self Care | Payer: Medicare Other | Source: Ambulatory Visit | Attending: Oncology | Admitting: Oncology

## 2013-08-22 DIAGNOSIS — E042 Nontoxic multinodular goiter: Secondary | ICD-10-CM | POA: Insufficient documentation

## 2013-08-22 DIAGNOSIS — C50912 Malignant neoplasm of unspecified site of left female breast: Secondary | ICD-10-CM

## 2013-08-22 DIAGNOSIS — C50919 Malignant neoplasm of unspecified site of unspecified female breast: Secondary | ICD-10-CM | POA: Insufficient documentation

## 2013-08-22 DIAGNOSIS — Q211 Atrial septal defect, unspecified: Secondary | ICD-10-CM

## 2013-08-22 DIAGNOSIS — K7689 Other specified diseases of liver: Secondary | ICD-10-CM | POA: Insufficient documentation

## 2013-08-22 DIAGNOSIS — K668 Other specified disorders of peritoneum: Secondary | ICD-10-CM | POA: Insufficient documentation

## 2013-08-22 DIAGNOSIS — J438 Other emphysema: Secondary | ICD-10-CM | POA: Insufficient documentation

## 2013-08-22 DIAGNOSIS — R599 Enlarged lymph nodes, unspecified: Secondary | ICD-10-CM | POA: Insufficient documentation

## 2013-08-22 DIAGNOSIS — C773 Secondary and unspecified malignant neoplasm of axilla and upper limb lymph nodes: Secondary | ICD-10-CM | POA: Insufficient documentation

## 2013-08-22 DIAGNOSIS — R002 Palpitations: Secondary | ICD-10-CM

## 2013-08-22 DIAGNOSIS — Z9089 Acquired absence of other organs: Secondary | ICD-10-CM | POA: Insufficient documentation

## 2013-08-22 DIAGNOSIS — I080 Rheumatic disorders of both mitral and aortic valves: Secondary | ICD-10-CM | POA: Insufficient documentation

## 2013-08-22 DIAGNOSIS — C50911 Malignant neoplasm of unspecified site of right female breast: Secondary | ICD-10-CM

## 2013-08-22 DIAGNOSIS — I517 Cardiomegaly: Secondary | ICD-10-CM | POA: Insufficient documentation

## 2013-08-22 DIAGNOSIS — N289 Disorder of kidney and ureter, unspecified: Secondary | ICD-10-CM | POA: Insufficient documentation

## 2013-08-22 DIAGNOSIS — Z6825 Body mass index (BMI) 25.0-25.9, adult: Secondary | ICD-10-CM | POA: Insufficient documentation

## 2013-08-22 DIAGNOSIS — K869 Disease of pancreas, unspecified: Secondary | ICD-10-CM | POA: Insufficient documentation

## 2013-08-22 DIAGNOSIS — I359 Nonrheumatic aortic valve disorder, unspecified: Secondary | ICD-10-CM

## 2013-08-22 LAB — GLUCOSE, CAPILLARY: GLUCOSE-CAPILLARY: 100 mg/dL — AB (ref 70–99)

## 2013-08-22 MED ORDER — IOHEXOL 300 MG/ML  SOLN
100.0000 mL | Freq: Once | INTRAMUSCULAR | Status: AC | PRN
  Administered 2013-08-22: 100 mL via INTRAVENOUS

## 2013-08-22 MED ORDER — FLUDEOXYGLUCOSE F - 18 (FDG) INJECTION
19.4000 | Freq: Once | INTRAVENOUS | Status: AC | PRN
  Administered 2013-08-22: 19.4 via INTRAVENOUS

## 2013-08-22 NOTE — Progress Notes (Signed)
Echocardiogram 2D Echocardiogram has been performed.  Joelene Millin 08/22/2013, 12:21 PM

## 2013-08-23 ENCOUNTER — Ambulatory Visit
Admission: RE | Admit: 2013-08-23 | Discharge: 2013-08-23 | Disposition: A | Discharge status: Home / Self Care | Payer: Medicare Other | Source: Ambulatory Visit | Attending: General Surgery | Admitting: General Surgery

## 2013-08-23 DIAGNOSIS — C50911 Malignant neoplasm of unspecified site of right female breast: Secondary | ICD-10-CM

## 2013-08-23 DIAGNOSIS — C50912 Malignant neoplasm of unspecified site of left female breast: Principal | ICD-10-CM

## 2013-08-23 MED ORDER — GADOBENATE DIMEGLUMINE 529 MG/ML IV SOLN
13.0000 mL | Freq: Once | INTRAVENOUS | Status: AC | PRN
  Administered 2013-08-23: 13 mL via INTRAVENOUS

## 2013-08-25 ENCOUNTER — Ambulatory Visit (HOSPITAL_COMMUNITY): Admission: RE | Admit: 2013-08-25 | Payer: Medicare Other | Source: Ambulatory Visit | Admitting: General Surgery

## 2013-08-25 ENCOUNTER — Encounter (HOSPITAL_COMMUNITY): Admission: RE | Payer: Self-pay | Source: Ambulatory Visit

## 2013-08-25 ENCOUNTER — Encounter: Payer: Medicare Other | Admitting: Genetic Counselor

## 2013-08-25 ENCOUNTER — Telehealth: Payer: Self-pay | Admitting: *Deleted

## 2013-08-25 ENCOUNTER — Other Ambulatory Visit: Payer: Medicare Other

## 2013-08-25 SURGERY — INSERTION, TUNNELED CENTRAL VENOUS DEVICE, WITH PORT
Anesthesia: General

## 2013-08-25 NOTE — Telephone Encounter (Signed)
Faxed Care Plan to Leigh at [facility] & to the PCP.  Took Care Plan to Med Rec to scan.

## 2013-08-27 ENCOUNTER — Other Ambulatory Visit: Payer: Self-pay | Admitting: *Deleted

## 2013-08-27 ENCOUNTER — Telehealth: Payer: Self-pay | Admitting: *Deleted

## 2013-08-27 ENCOUNTER — Other Ambulatory Visit: Payer: Self-pay | Admitting: Radiology

## 2013-08-27 DIAGNOSIS — C50919 Malignant neoplasm of unspecified site of unspecified female breast: Secondary | ICD-10-CM

## 2013-08-27 NOTE — Telephone Encounter (Signed)
Patient called and left a message regarding her appt for Monday. Stated that she is to have a port placed, message forwarded to the RN desk.   JMW

## 2013-08-28 ENCOUNTER — Telehealth: Payer: Self-pay | Admitting: *Deleted

## 2013-08-28 ENCOUNTER — Telehealth: Payer: Self-pay | Admitting: Oncology

## 2013-08-28 ENCOUNTER — Telehealth: Payer: Self-pay | Admitting: Emergency Medicine

## 2013-08-28 NOTE — Telephone Encounter (Signed)
Attempted to call pt several times to discuss care plan.  Received message from Dr. Laurelyn Sickle nurse that pt has cancelled her port and chemo.  Will attempt again to f/u with pt.

## 2013-08-28 NOTE — Telephone Encounter (Signed)
, °

## 2013-08-28 NOTE — Telephone Encounter (Signed)
Attempted to call patient regarding her plan of care. No answer. Voicemail not set up.

## 2013-08-29 ENCOUNTER — Inpatient Hospital Stay (HOSPITAL_COMMUNITY): Admission: RE | Admit: 2013-08-29 | Payer: Medicare Other | Source: Ambulatory Visit

## 2013-08-29 ENCOUNTER — Ambulatory Visit (HOSPITAL_COMMUNITY): Admission: RE | Admit: 2013-08-29 | Payer: Medicare Other | Source: Ambulatory Visit

## 2013-08-29 ENCOUNTER — Telehealth: Payer: Self-pay | Admitting: Emergency Medicine

## 2013-08-29 NOTE — Telephone Encounter (Signed)
Spoke with patient. Instructed patient to come see Dr Humphrey Rolls on 02/02 at 0830 as scheduled. Labs and chemo canceled and injection for 2/3 canceled. Dr Humphrey Rolls will discuss plan of care during this visit. Patient verbalized understanding.

## 2013-09-01 ENCOUNTER — Other Ambulatory Visit: Payer: Medicare Other

## 2013-09-01 ENCOUNTER — Telehealth: Payer: Self-pay | Admitting: Oncology

## 2013-09-01 ENCOUNTER — Ambulatory Visit (HOSPITAL_BASED_OUTPATIENT_CLINIC_OR_DEPARTMENT_OTHER): Payer: Medicare Other | Admitting: Oncology

## 2013-09-01 ENCOUNTER — Encounter: Payer: Self-pay | Admitting: Oncology

## 2013-09-01 ENCOUNTER — Ambulatory Visit: Payer: Medicare Other

## 2013-09-01 VITALS — BP 133/79 | HR 73 | Temp 98.2°F | Resp 18 | Ht 60.0 in | Wt 130.7 lb

## 2013-09-01 DIAGNOSIS — Z720 Tobacco use: Secondary | ICD-10-CM

## 2013-09-01 DIAGNOSIS — Z170 Estrogen receptor positive status [ER+]: Secondary | ICD-10-CM

## 2013-09-01 DIAGNOSIS — C50911 Malignant neoplasm of unspecified site of right female breast: Secondary | ICD-10-CM

## 2013-09-01 DIAGNOSIS — C773 Secondary and unspecified malignant neoplasm of axilla and upper limb lymph nodes: Secondary | ICD-10-CM

## 2013-09-01 DIAGNOSIS — G893 Neoplasm related pain (acute) (chronic): Secondary | ICD-10-CM

## 2013-09-01 DIAGNOSIS — C50912 Malignant neoplasm of unspecified site of left female breast: Secondary | ICD-10-CM

## 2013-09-01 DIAGNOSIS — C50919 Malignant neoplasm of unspecified site of unspecified female breast: Secondary | ICD-10-CM

## 2013-09-01 MED ORDER — LORAZEPAM 0.5 MG PO TABS: 0.5000 mg | ORAL_TABLET | Freq: Four times a day (QID) | ORAL | Status: DC | PRN

## 2013-09-01 MED ORDER — HYDROMORPHONE HCL 2 MG PO TABS: 2.0000 mg | ORAL_TABLET | Freq: Four times a day (QID) | ORAL | Status: DC | PRN

## 2013-09-01 MED ORDER — LIDOCAINE-PRILOCAINE 2.5-2.5 % EX CREA: TOPICAL_CREAM | CUTANEOUS | Status: DC | PRN

## 2013-09-01 MED ORDER — ONDANSETRON HCL 8 MG PO TABS: 8.0000 mg | ORAL_TABLET | Freq: Two times a day (BID) | ORAL | Status: DC | PRN

## 2013-09-01 MED ORDER — DEXAMETHASONE 4 MG PO TABS: ORAL_TABLET | ORAL | Status: DC

## 2013-09-01 MED ORDER — PROCHLORPERAZINE MALEATE 10 MG PO TABS: 10.0000 mg | ORAL_TABLET | Freq: Four times a day (QID) | ORAL | Status: DC | PRN

## 2013-09-01 NOTE — Patient Instructions (Signed)
Smoking Cessation Quitting smoking is important to your health and has many advantages. However, it is not always easy to quit since nicotine is a very addictive drug. Often times, people try 3 times or more before being able to quit. This document explains the best ways for you to prepare to quit smoking. Quitting takes hard work and a lot of effort, but you can do it. ADVANTAGES OF QUITTING SMOKING  You will live longer, feel better, and live better.  Your body will feel the impact of quitting smoking almost immediately.  Within 20 minutes, blood pressure decreases. Your pulse returns to its normal level.  After 8 hours, carbon monoxide levels in the blood return to normal. Your oxygen level increases.  After 24 hours, the chance of having a heart attack starts to decrease. Your breath, hair, and body stop smelling like smoke.  After 48 hours, damaged nerve endings begin to recover. Your sense of taste and smell improve.  After 72 hours, the body is virtually free of nicotine. Your bronchial tubes relax and breathing becomes easier.  After 2 to 12 weeks, lungs can hold more air. Exercise becomes easier and circulation improves.  The risk of having a heart attack, stroke, cancer, or lung disease is greatly reduced.  After 1 year, the risk of coronary heart disease is cut in half.  After 5 years, the risk of stroke falls to the same as a nonsmoker.  After 10 years, the risk of lung cancer is cut in half and the risk of other cancers decreases significantly.  After 15 years, the risk of coronary heart disease drops, usually to the level of a nonsmoker.  If you are pregnant, quitting smoking will improve your chances of having a healthy baby.  The people you live with, especially any children, will be healthier.  You will have extra money to spend on things other than cigarettes. QUESTIONS TO THINK ABOUT BEFORE ATTEMPTING TO QUIT You may want to talk about your answers with your  caregiver.  Why do you want to quit?  If you tried to quit in the past, what helped and what did not?  What will be the most difficult situations for you after you quit? How will you plan to handle them?  Who can help you through the tough times? Your family? Friends? A caregiver?  What pleasures do you get from smoking? What ways can you still get pleasure if you quit? Here are some questions to ask your caregiver:  How can you help me to be successful at quitting?  What medicine do you think would be best for me and how should I take it?  What should I do if I need more help?  What is smoking withdrawal like? How can I get information on withdrawal? GET READY  Set a quit date.  Change your environment by getting rid of all cigarettes, ashtrays, matches, and lighters in your home, car, or work. Do not let people smoke in your home.  Review your past attempts to quit. Think about what worked and what did not. GET SUPPORT AND ENCOURAGEMENT You have a better chance of being successful if you have help. You can get support in many ways.  Tell your family, friends, and co-workers that you are going to quit and need their support. Ask them not to smoke around you.  Get individual, group, or telephone counseling and support. Programs are available at local hospitals and health centers. Call your local health department for   information about programs in your area.  Spiritual beliefs and practices may help some smokers quit.  Download a "quit meter" on your computer to keep track of quit statistics, such as how long you have gone without smoking, cigarettes not smoked, and money saved.  Get a self-help book about quitting smoking and staying off of tobacco. LEARN NEW SKILLS AND BEHAVIORS  Distract yourself from urges to smoke. Talk to someone, go for a walk, or occupy your time with a task.  Change your normal routine. Take a different route to work. Drink tea instead of coffee.  Eat breakfast in a different place.  Reduce your stress. Take a hot bath, exercise, or read a book.  Plan something enjoyable to do every day. Reward yourself for not smoking.  Explore interactive web-based programs that specialize in helping you quit. GET MEDICINE AND USE IT CORRECTLY Medicines can help you stop smoking and decrease the urge to smoke. Combining medicine with the above behavioral methods and support can greatly increase your chances of successfully quitting smoking.  Nicotine replacement therapy helps deliver nicotine to your body without the negative effects and risks of smoking. Nicotine replacement therapy includes nicotine gum, lozenges, inhalers, nasal sprays, and skin patches. Some may be available over-the-counter and others require a prescription.  Antidepressant medicine helps people abstain from smoking, but how this works is unknown. This medicine is available by prescription.  Nicotinic receptor partial agonist medicine simulates the effect of nicotine in your brain. This medicine is available by prescription. Ask your caregiver for advice about which medicines to use and how to use them based on your health history. Your caregiver will tell you what side effects to look out for if you choose to be on a medicine or therapy. Carefully read the information on the package. Do not use any other product containing nicotine while using a nicotine replacement product.  RELAPSE OR DIFFICULT SITUATIONS Most relapses occur within the first 3 months after quitting. Do not be discouraged if you start smoking again. Remember, most people try several times before finally quitting. You may have symptoms of withdrawal because your body is used to nicotine. You may crave cigarettes, be irritable, feel very hungry, cough often, get headaches, or have difficulty concentrating. The withdrawal symptoms are only temporary. They are strongest when you first quit, but they will go away within  10 14 days. To reduce the chances of relapse, try to:  Avoid drinking alcohol. Drinking lowers your chances of successfully quitting.  Reduce the amount of caffeine you consume. Once you quit smoking, the amount of caffeine in your body increases and can give you symptoms, such as a rapid heartbeat, sweating, and anxiety.  Avoid smokers because they can make you want to smoke.  Do not let weight gain distract you. Many smokers will gain weight when they quit, usually less than 10 pounds. Eat a healthy diet and stay active. You can always lose the weight gained after you quit.  Find ways to improve your mood other than smoking. FOR MORE INFORMATION  www.smokefree.gov  Document Released: 07/11/2001 Document Revised: 01/16/2012 Document Reviewed: 10/26/2011 [employer] Patient Information 2014 [employer].  

## 2013-09-01 NOTE — Telephone Encounter (Signed)
, °

## 2013-09-01 NOTE — Progress Notes (Signed)
OFFICE PROGRESS NOTE  CC   Paula Hampshire, MD [place] [address] Dr. Thea Silversmith  Dr. Autumn Messing  DIAGNOSIS: 63 year old ER positive breast cancers. Patient is seen in the multidisciplinary breast clinic for discussion of treatment options  STAGE:  Bilateral breast cancer  Primary site: Breast (Bilateral)  Staging method: AJCC 7th Edition  Clinical: Stage IIB (T2, N1, cM0)  Summary: Stage IIB (T2, N1, cM0)     PRIOR THERAPY: #1Patient states that she felt pain in her breasts and masses. Because of this she went on to have further workup performed including mammograms and ultrasound. On 07/23/2013 patient had bilateral diagnostic mammogram she was noted to have bilateral breast masses on the right irregular mass lying posterior lateral breast associated with a large rounded and dense axillary nodes. The left showed slightly smaller irregular mass lying at the 11:00 to 12:00 position. There was a smaller adjacent cyst. Also noted to have a large dense axillary lymph nodes on the left. Because of this she was recommended ultrasound and biopsies. 08/07/2013 patient underwent ultrasound-guided right breast core needle biopsy will. As well as left eye out see. 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 The lymph node was positive for metastatic disease. On the left patient had a biopsy that also showed invasive ductal carcinoma grade 3 ER positive PR negative HER-2/neu negative with a proliferation marker Ki-67 80%. Lymph node was not biopsied. Her clinical and radiologic staging was T2 NX on the left side T2 N1 on the right side. Patient does have pain in the left breast at the 12:00 position  CURRENT THERAPY:undergone workup  INTERVAL HISTORY: Paula Navarro 63 y.o. female returns for followup visit today. She was seen on 08/13/2013 for initial visit  with me in the multidisciplinary breast clinic. We arranged for her to get scans echocardiogram and Port-A-Cath placement with the plan of starting her on neoadjuvant chemotherapy. However patient counseled her Port-A-Cath appointments several times. She also counseled her staging scans as well as echocardiogram. However now she went ahead and had the staging studies performed. But she has not been able to get the port placed. She is ready to proceed with her chemotherapy. We again discussed the rationale for neoadjuvant chemotherapy. We discussed breast conservation versus the possibility of having a mastectomy. Patient has understood recommendations. and would like to proceed with treatment. She is still having pain and is requesting a prescription for oxycodone which I have given to her.  MEDICAL HISTORY: Past Medical History  Diagnosis Date  . Chronic bronchitis   . Palpitation     Tachycardia reported by monitor clerk during a symptomatic spell  . Chest pain     Admitted to [facility] in 09/2011; refused stress test  . Atrial septal defect 1996    Surgical repair in 1996  . Tobacco abuse     60 pack years; 1.5 packs per day  . Anxiety   . Anemia     ALLERGIES:  is allergic to codeine.  MEDICATIONS:  Current Outpatient Prescriptions  Medication Sig Dispense Refill  . HYDROmorphone (DILAUDID) 2 MG tablet Take 1 tablet (2 mg total) by mouth every 6 (six) hours as needed for severe pain.  30 tablet  0  . LORazepam (ATIVAN) 1 MG tablet Take 1 tablet (1 mg total) by mouth every 6 (six) hours as needed for anxiety.  30 tablet  1  . albuterol (PROVENTIL HFA;VENTOLIN HFA) 108 (90 BASE) MCG/ACT inhaler Inhale 1-2 puffs into the lungs every 6 (six) hours as needed for wheezing.  1 Inhaler  0  . celecoxib (CELEBREX) 100 MG capsule Take 1 capsule (100 mg total) by mouth 2 (two) times daily.  14 capsule  0  . oxyCODONE-acetaminophen (PERCOCET/ROXICET) 5-325 MG per tablet Take 1 tablet by mouth every 4  (four) hours as needed for severe pain.  20 tablet  0   No current facility-administered medications for this visit.    SURGICAL HISTORY:  Past Surgical History  Procedure Laterality Date  . Cholecystectomy    . Cesarean section    . Vein bypass surgery      REVIEW OF SYSTEMS:  Pertinent items are noted in HPI.     PHYSICAL EXAMINATION: Blood pressure 133/79, pulse 73, temperature 98.2 F (36.8 C), temperature source Oral, resp. rate 18, height 5' (1.524 m), weight 130 lb 11.2 oz (59.285 kg), last menstrual period 05/20/2011. Body mass index is 25.53 kg/(m^2). ECOG PERFORMANCE STATUS: 1 - Symptomatic but completely ambulatory   General appearance: alert, cooperative and appears stated age Neck: no carotid bruit, no JVD, supple, symmetrical, trachea midline and thyroid not enlarged, symmetric, no tenderness/mass/nodules Lymph nodes: Axillary adenopathy: Positive Resp: clear to auscultation bilaterally Back: symmetric, no curvature. ROM normal. No CVA tenderness. Cardio: regular rate and rhythm GI: soft, non-tender; bowel sounds normal; no masses,  no organomegaly Extremities: extremities normal, atraumatic, no cyanosis or edema Breasts: abnormal mass palpable bilateral breasts.   LABORATORY DATA: Lab Results  Component Value Date   WBC 13.6* 08/13/2013   HGB 14.2 08/13/2013   HCT 45.1 08/13/2013   MCV 72.3* 08/13/2013   PLT 261 08/13/2013      Chemistry      Component Value Date/Time   NA 140 08/13/2013 0840   NA 141 09/23/2012 0414   K 4.4 08/13/2013 0840   K 3.8 09/23/2012 0414   CL 107 09/23/2012 0414   CO2 24 08/13/2013 0840   CO2 29 10/30/2011 1419   BUN 16.1 08/13/2013 0840   BUN 7 09/23/2012 0414   CREATININE 1.0 08/13/2013 0840   CREATININE 0.90 09/23/2012 0414      Component Value Date/Time   CALCIUM 9.5 08/13/2013 0840   CALCIUM 10.1 10/30/2011 1419   ALKPHOS 82 08/13/2013 0840   AST 17 08/13/2013 0840   ALT 13 08/13/2013 0840   BILITOT 0.74 08/13/2013 0840        RADIOGRAPHIC STUDIES:  Ct Chest W Contrast  08/22/2013   CLINICAL DATA:  Breast cancer  EXAM: CT CHEST, ABDOMEN, AND PELVIS WITH CONTRAST  TECHNIQUE: Multidetector CT imaging of the chest, abdomen and pelvis was performed following the standard protocol during bolus administration of intravenous contrast.  CONTRAST:  138m OMNIPAQUE IOHEXOL 300 MG/ML  SOLN  COMPARISON:  None.  FINDINGS: CT CHEST FINDINGS  Bulky right axillary lymphadenopathy is evident. Index node in the right axilla measures 2.9 x 1.9 cm. There is associated bulky right subpectoral lymphadenopathy with a 1.8 x 2.9 cm lymph node identified. In the lower outer aspect of the right breast, a 3.8 x 3.4 cm rim enhancing lesion is identified.  There is lymphadenopathy in the left axilla as well. 1.8 x 1.2 cm left axillary lymph node is noted. There is a necrotic 1.3 x 1.4 cm lymph node in the left subpectoral region. Other smaller left subpectoral lymph nodes are associated. 3.0 x 3.2 cm necrotic lesion in  the upper left breast is evident.  9 mm nodule is seen in the anterior right thyroid lobe and there is a 5 mm nodule in the posterior left thyroid lobe. Borderline lymphadenopathy is identified in the prevascular space of the AP window. No subcarinal lymphadenopathy. No hilar lymphadenopathy.  The heart size is enlarged. Patient is status post median sternotomy. There is no pericardial or pleural effusion.  Lung windows demonstrate emphysema. No pulmonary parenchymal nodular mass. No focal areas of airspace consolidation. The  Bone windows reveal no worrisome lytic or sclerotic osseous lesions.  CT ABDOMEN AND PELVIS FINDINGS  Fatty infiltration of the liver parenchyma is evident. There is mild prominence of the intra hepatic bile ducts without gross dilatation of the extrahepatic common duct. These changes may be related to prior cholecystectomy. Spleen is normal. The stomach and duodenum are unremarkable. There is a 9 x 6 mm hypo  attenuating lesion in the tail of the pancreas. The main pancreatic duct is slightly prominent.  No evidence for adrenal nodule or mass. The left kidney is normal. 7 x 13 mm lesion in the right kidney is probably a cyst.  No abdominal aortic aneurysm. No free fluid or lymphadenopathy in the abdomen. There is a small 4 x 8 mm nodule in the right omentum.  Imaging through the pelvis shows no free intraperitoneal fluid. No pelvic sidewall lymphadenopathy. Fibroid changes noted in the uterus. Bladder is unremarkable. No adnexal mass. The terminal ileum is normal. The appendix is normal.  Bone windows reveal no worrisome lytic or sclerotic osseous lesions.  IMPRESSION: Large bilateral breast masses with bulky, sometimes necrotic lymphadenopathy in both subpectoral regions and both axillary regions, consistent with metastatic spread.  Tiny bilateral thyroid nodules. These may not be clinically relevant in this individual.  Mild prominence of the bile ducts and pancreatic duct. The biliary prominence may be secondary to the cholecystectomy. Main pancreatic duct prominence is of indeterminate etiology. There is a 9 x 6 mm hypo attenuating lesion in the tail the pancreas which could represent a tiny pseudocyst or cystic neoplasm.  8 mm right omental nodule is nonspecific. Attention to this area on follow-up imaging is recommended.   Electronically Signed   By: Misty Stanley M.D.   On: 08/22/2013 17:00   Ct Abdomen Pelvis W Contrast  08/22/2013   CLINICAL DATA:  Breast cancer  EXAM: CT CHEST, ABDOMEN, AND PELVIS WITH CONTRAST  TECHNIQUE: Multidetector CT imaging of the chest, abdomen and pelvis was performed following the standard protocol during bolus administration of intravenous contrast.  CONTRAST:  118m OMNIPAQUE IOHEXOL 300 MG/ML  SOLN  COMPARISON:  None.  FINDINGS: CT CHEST FINDINGS  Bulky right axillary lymphadenopathy is evident. Index node in the right axilla measures 2.9 x 1.9 cm. There is associated bulky  right subpectoral lymphadenopathy with a 1.8 x 2.9 cm lymph node identified. In the lower outer aspect of the right breast, a 3.8 x 3.4 cm rim enhancing lesion is identified.  There is lymphadenopathy in the left axilla as well. 1.8 x 1.2 cm left axillary lymph node is noted. There is a necrotic 1.3 x 1.4 cm lymph node in the left subpectoral region. Other smaller left subpectoral lymph nodes are associated. 3.0 x 3.2 cm necrotic lesion in the upper left breast is evident.  9 mm nodule is seen in the anterior right thyroid lobe and there is a 5 mm nodule in the posterior left thyroid lobe. Borderline lymphadenopathy is identified in the prevascular space of  the AP window. No subcarinal lymphadenopathy. No hilar lymphadenopathy.  The heart size is enlarged. Patient is status post median sternotomy. There is no pericardial or pleural effusion.  Lung windows demonstrate emphysema. No pulmonary parenchymal nodular mass. No focal areas of airspace consolidation. The  Bone windows reveal no worrisome lytic or sclerotic osseous lesions.  CT ABDOMEN AND PELVIS FINDINGS  Fatty infiltration of the liver parenchyma is evident. There is mild prominence of the intra hepatic bile ducts without gross dilatation of the extrahepatic common duct. These changes may be related to prior cholecystectomy. Spleen is normal. The stomach and duodenum are unremarkable. There is a 9 x 6 mm hypo attenuating lesion in the tail of the pancreas. The main pancreatic duct is slightly prominent.  No evidence for adrenal nodule or mass. The left kidney is normal. 7 x 13 mm lesion in the right kidney is probably a cyst.  No abdominal aortic aneurysm. No free fluid or lymphadenopathy in the abdomen. There is a small 4 x 8 mm nodule in the right omentum.  Imaging through the pelvis shows no free intraperitoneal fluid. No pelvic sidewall lymphadenopathy. Fibroid changes noted in the uterus. Bladder is unremarkable. No adnexal mass. The terminal ileum is  normal. The appendix is normal.  Bone windows reveal no worrisome lytic or sclerotic osseous lesions.  IMPRESSION: Large bilateral breast masses with bulky, sometimes necrotic lymphadenopathy in both subpectoral regions and both axillary regions, consistent with metastatic spread.  Tiny bilateral thyroid nodules. These may not be clinically relevant in this individual.  Mild prominence of the bile ducts and pancreatic duct. The biliary prominence may be secondary to the cholecystectomy. Main pancreatic duct prominence is of indeterminate etiology. There is a 9 x 6 mm hypo attenuating lesion in the tail the pancreas which could represent a tiny pseudocyst or cystic neoplasm.  8 mm right omental nodule is nonspecific. Attention to this area on follow-up imaging is recommended.   Electronically Signed   By: Misty Stanley M.D.   On: 08/22/2013 17:00   Mr Breast Bilateral W Wo Contrast  08/25/2013   CLINICAL DATA:  63 year old female with newly diagnosed cancers in both breasts with axillary lymphatic spread. Preoperative/treatment planning.  EXAM: BILATERAL BREAST MRI WITH AND WITHOUT CONTRAST  LABS:  None  TECHNIQUE: Multiplanar, multisequence MR images of both breasts were obtained prior to and following the intravenous administration of 39ml of MultiHance.  THREE-DIMENSIONAL MR IMAGE RENDERING ON INDEPENDENT WORKSTATION:  Three-dimensional MR images were rendered by post-processing of the original MR data on an independent workstation. The three-dimensional MR images were interpreted, and findings are reported in the following complete MRI report for this study. Three dimensional images were evaluated at the independent DynaCad workstation  COMPARISON:  08/07/2013 and prior studies  FINDINGS: Breast composition: b.  Scattered fibroglandular tissue.  Background parenchymal enhancement: Moderate  Right breast: A 4 x 4 x 5 cm (AP X transverse X CC) irregular enhancing mass with rapid washout kinetics is identified  within the posterior central and upper-outer right breast, containing biopsy clip artifact - compatible with biopsy proven neoplasm. Non masslike enhancement and small scattered enhancing masses within the central and most of the upper right breast are identified, with the entire area of abnormal enhancement encompassing a region measuring 8 x 9 x 7 cm (AP X transverse X CC).  Left breast: A 4.5 x 4 x 4.5 cm (AP X transverse X CC) irregular enhancing mass with rapid washout kinetics is identified within the  upper/ upper-outer left breast, containing biopsy clip artifact compatible with biopsy proven neoplasm. Non masslike enhancement and small scattered enhancing masses within the majority of the upper left breast are identified, with the entire area of abnormal enhancement encompassing a region measuring 8 x 5 x 5.5 cm (AP X transverse X CC).  Lymph nodes: Enlarged bilateral level 1, level 2 and level 3 axillary lymph nodes are compatible with lymphatic spread. A 1 cm left internal mammary lymph node is also identified.  Ancillary findings:  None.  IMPRESSION: Biopsy proven neoplasm within both breasts, with bilateral axillary (level 1, level 2 and level 3) and left internal mammary lymphatic spread. The entire area of abnormal enhancement/neoplasm measures 8 x 9 x 7 cm within the right breast and 8 x 5 x 5.5 cm within the left breast.  RECOMMENDATION: Treatment plan  BI-RADS CATEGORY  6: Known biopsy-proven malignancy - appropriate action should be taken.   Electronically Signed   By: Hassan Rowan M.D.   On: 08/25/2013 11:12   Nm Pet Image Initial (pi) Skull Base To Thigh  08/22/2013   CLINICAL DATA:  Initial treatment strategy for breast carcinoma. Bilateral node positive breast carcinoma.  EXAM: NUCLEAR MEDICINE PET SKULL BASE TO THIGH  FASTING BLOOD GLUCOSE:  Value: 165m/dl  TECHNIQUE: 17.4 mCi F-18 FDG was injected intravenously. CT data was obtained and used for attenuation correction and anatomic  localization only. (This was not acquired as a diagnostic CT examination.) Additional exam technical data entered on technologist worksheet.  COMPARISON:  CT CHEST W/CM dated 08/22/2013  FINDINGS: NECK  No hypermetabolic lymph nodes in the neck.  CHEST  Bilateral hypermetabolic breast masses. These are intensely hypermetabolic with the right breast mass with SUV max 14.4 and the right breast mass with SUV max 15.1.  There are bilateral hypermetabolic axillary lymph nodes extending to the subpectoral also positions. 23 mm right axial lymph node with SUV max 14.4 (image 63). There is suspicion for a mildly hypermetabolic left internal mammary lymph node measuring 6 mm on image 64. Essentially hypermetabolic small prevascular lymph nodes anterior aorta are difficult to image. No suspicious pulmonary nodules. No paratracheal adenopathy.  ABDOMEN/PELVIS  No abnormal activity within the liver. No hypermetabolic abdominal pelvic lymph nodes.  There is a focus of intense hypermetabolic activity within the left aspect of the uterus in the lower uterine segment. There is an enhancing focus at this level measuring 29 x 26 mm (image 95, series 2) of the of dedicated CT scan of the same day. This intensely hypermetabolic with SUV max 127.0  SKELETON  There is a hypermetabolic focus within the posterior right iliac bone (image 170) with SUV max 6.2. There is no clear CT correlation this lesion.  IMPRESSION: 1. Bilateral hypermetabolic breast masses. 2. Bilateral hypermetabolic axillary nodal metastasis extending to the subpectoral nodal station. . 3. Concern for left internal mammary nodal metastasis. 4. Focal metabolic activity in post right iliac bone is consistent with bone metastasis (this is CT occult). 5. Hypermetabolic focus within the left aspect of lower uterine segment may represent a leiomyoma. Recommend pelvic ultrasound to exclude endometrial carcinoma.   Electronically Signed   By: SSuzy BouchardM.D.   On:  08/22/2013 17:30   Mm Digital Diagnostic Bilat  08/07/2013   CLINICAL DATA:  Post biopsy of highly suspicious bilateral breast masses as well as a right axillary lymph node.  EXAM: DIAGNOSTIC BILATERAL MAMMOGRAM POST ULTRASOUND BIOPSY  COMPARISON:  Previous exams  FINDINGS: Mammographic images  were obtained following ultrasound guided biopsy of a suspicious mass in the lateral right breast as well as the suspicious mass in the superior left breast. A ribbon shaped biopsy marking clip is present in the targeted region of the right breast mass. A top hat shaped biopsy marking clip is present in the targeted region of the left breast mass.  A clip was not deployed into the biopsied right axillary lymph node.  IMPRESSION: Appropriate positioning of bilateral biopsy marking clips in the breast post ultrasound-guided biopsy of highly suspicious bilateral breast masses.  Final Assessment: Post Procedure Mammograms for Marker Placement   Electronically Signed   By: Everlean Alstrom M.D.   On: 08/07/2013 16:15   [place] Lt Breast Bx W Loc Dev 1st Lesion Img Bx Spec US Guide  08/15/2013   ADDENDUM REPORT: 08/15/2013 11:48  ADDENDUM: Pathology results: Pathology results from the left axillary lymph node biopsy revealed metastatic disease. This is concordant with the imaging findings. The patient has been notified of the results. She continues to have pain in the left breast however denies any biopsy site complications.  The patient will follow-up for her known bilateral breast malignancy. The patient has been instructed to call the [facility] with any questions or concerns.   Electronically Signed   By: Everlean Alstrom M.D.   On: 08/15/2013 11:48   08/15/2013   CLINICAL DATA:  Known bilateral breast cancer with right axillary metastasis. Enlarged left axillary adenopathy. Rule out metastatic disease to the left axilla.  EXAM: ULTRASOUND GUIDED LEFT BREAST CORE NEEDLE BIOPSY  COMPARISON:  Previous exams.  FINDINGS: I met  with the patient and we discussed the procedure of an ultrasound-guided biopsy, including benefits and alternatives. We discussed the high likelihood of a successful procedure. We discussed the risks of the procedure, including infection, bleeding, tissue injury, clip migration, and inadequate sampling. Informed written consent was given. The usual time-out protocol was performed immediately prior to the procedure.  Using sterile technique and 2% Lidocaine as local anesthetic, under direct ultrasound visualization, a 14 gauge spring-loaded device was used to perform biopsy of left axillary adenopathy using an inferior approach.  IMPRESSION: Ultrasound guided biopsy of the left axilla. No apparent complications.  Electronically Signed: By: Lillia Mountain M.D. On: 08/13/2013 15:03   [place] Lt Breast Bx W Loc Dev 1st Lesion Img Bx Spec US Guide  08/07/2013   CLINICAL DATA:  Highly suspicious left breast mass.  EXAM: ULTRASOUND GUIDED LEFT BREAST CORE NEEDLE BIOPSY WITH VACUUM ASSIST  COMPARISON:  Previous exams.  PROCEDURE: I met with the patient and we discussed the procedure of ultrasound-guided biopsy, including benefits and alternatives. We discussed the high likelihood of a successful procedure. We discussed the risks of the procedure including infection, bleeding, tissue injury, clip migration, and inadequate sampling. Informed written consent was given. The usual time-out protocol was performed immediately prior to the procedure.  Using sterile technique and 2% Lidocaine as local anesthetic, under direct ultrasound visualization, a 12 gauge vacuum-assisteddevice was used to perform biopsy of the suspicious mass in the superior left breast at 12 o'clock using a lateral approach. At the conclusion of the procedure, a top hat shaped tissue marker clip was deployed into the biopsy cavity. Follow-up 2-view mammogram was performed and dictated separately.  IMPRESSION: Ultrasound-guided biopsy of the suspicious mass in the  left breast at 12 o'clock. No apparent complications.   Electronically Signed   By: Everlean Alstrom M.D.   On: 08/07/2013 16:13  [place] Rt Breast Bx W Loc Dev 1st Lesion Img Bx Spec US Guide  08/08/2013   ADDENDUM REPORT: 08/08/2013 11:45  ADDENDUM: Pathology results:  1. Pathology results from the right breast mass revealed invasive mammary carcinoma.  2. Pathology results from the right axillary lymph node revealed metastatic mammary carcinoma.  3. Pathology results from the left breast mass revealed invasive mammary carcinoma.  4. Note that the patient was also supposed to have a left axillary lymph node biopsy which was not done, in part due to patient not being able to tolerate an additional biopsy. The left axillary lymph nodes are also markedly abnormal, most of which appeared subpectoral/interpectoral.  This is concordant with the imaging findings. The patient has been notified of the results. Other than soreness she denies any biopsy site complications.  She has been scheduled to be seen in multidisciplinary Conference on August 13, 2013. A breast MRI will also be scheduled for the patient. The patient has been instructed to call the [facility] with any questions or concerns.   Electronically Signed   By: Everlean Alstrom M.D.   On: 08/08/2013 11:45   08/08/2013   CLINICAL DATA:  Highly suspicious right breast mass at 9-10 o'clock.  EXAM: ULTRASOUND GUIDED RIGHT BREAST CORE NEEDLE BIOPSY WITH VACUUM ASSIST  COMPARISON:  Previous exams.  PROCEDURE: I met with the patient and we discussed the procedure of ultrasound-guided biopsy, including benefits and alternatives. We discussed the high likelihood of a successful procedure. We discussed the risks of the procedure including infection, bleeding, tissue injury, clip migration, and inadequate sampling. Informed written consent was given. The usual time-out protocol was performed immediately prior to the procedure.  Using sterile technique and 2% Lidocaine  as local anesthetic, under direct ultrasound visualization, a 12 gauge vacuum-assisteddevice was used to perform biopsy of the highly suspicious mass in the outer right breast using a lateral approach. At the conclusion of the procedure, a ribbon shaped tissue marker clip was deployed into the biopsy cavity. Follow-up 2-view mammogram was performed and dictated separately.  IMPRESSION: Ultrasound-guided biopsy of a highly suspicious mass in the lateral right breast. Note that this mass appears to involve the adjacent pectoralis muscle.  Electronically Signed: By: Everlean Alstrom M.D. On: 08/07/2013 16:12   [place] Rt Breast Bx W Loc Dev Ea Add Lesion Img Bx Spec US Guide  08/07/2013   CLINICAL DATA:  Right axillary lymphadenopathy.  EXAM: ULTRASOUND GUIDED RIGHT BREAST CORE NEEDLE BIOPSY WITH VACUUM ASSIST  COMPARISON:  Previous exams.  PROCEDURE: I met with the patient and we discussed the procedure of ultrasound-guided biopsy, including benefits and alternatives. We discussed the high likelihood of a successful procedure. We discussed the risks of the procedure including infection, bleeding, tissue injury, clip migration, and inadequate sampling. Informed written consent was given. The usual time-out protocol was performed immediately prior to the procedure.  Using sterile technique and 2% Lidocaine as local anesthetic, under direct ultrasound visualization, a 12 gauge vacuum-assisteddevice was used to perform biopsy of an enlarged and morphologically abnormal right axillary lymph node using a lateral approach.  IMPRESSION: Ultrasound-guided biopsy of an enlarged and morphologically abnormal right axillary lymph node. No apparent complications.   Electronically Signed   By: Everlean Alstrom M.D.   On: 08/07/2013 15:03    ASSESSMENT/PLAN: 33-year-old female with  #1 bilateral breast cancers: Presenting with palpable mass with tenderness. Axilla are positive on the right side for metastatic disease. 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 Right breast biopsy revealed invasive ductal carcinoma grade 2 ER positive PR negative HER-2/neu negative with a proliferation marker Ki-67 20%. recommended neoadjuvant chemotherapy consisting of  FEC given dose dense total of 6 cycles followed by Taxol weekly x12 weeks   #2 staging: Patient had PET/CT performed we discussed the results of this she does not have any evidence of distant disease.  #3 echocardiogram: Shows a normal ejection fraction.  #4 patient will need chemotherapy teaching class, she also needs her Port-A-Cath placed. I have made arrangements to have the port placed by interventional radiology.  #5 pain: Secondary to painful breast masses. Patient is given oxycodone prescription.  #6 followup: Patient will be seen back in 2 weeks' time to begin cycle 1 day 1 of chemotherapy of FEC   All questions were answered. The patient knows to call the clinic with any problems, questions or concerns. We can certainly see the patient much sooner if necessary.  I spent 25 minutes counseling the patient face to face. The total time spent in the appointment was 30 minutes.    Marcy Panning, MD Medical/Oncology [facility] [phone] (beeper) [phone] (Office)  09/01/2013, 9:11 AM

## 2013-09-02 ENCOUNTER — Ambulatory Visit: Payer: Medicare Other

## 2013-09-04 ENCOUNTER — Encounter (HOSPITAL_COMMUNITY): Payer: Self-pay | Admitting: Pharmacy Technician

## 2013-09-05 ENCOUNTER — Other Ambulatory Visit: Payer: Self-pay | Admitting: Radiology

## 2013-09-08 ENCOUNTER — Other Ambulatory Visit: Payer: Self-pay | Admitting: Oncology

## 2013-09-08 ENCOUNTER — Ambulatory Visit (HOSPITAL_COMMUNITY)
Admission: RE | Admit: 2013-09-08 | Discharge: 2013-09-08 | Disposition: A | Discharge status: Home / Self Care | Payer: Medicare Other | Source: Ambulatory Visit | Attending: Oncology | Admitting: Oncology

## 2013-09-08 ENCOUNTER — Encounter (HOSPITAL_COMMUNITY): Payer: Self-pay

## 2013-09-08 ENCOUNTER — Other Ambulatory Visit: Payer: Medicare Other

## 2013-09-08 DIAGNOSIS — C50919 Malignant neoplasm of unspecified site of unspecified female breast: Secondary | ICD-10-CM

## 2013-09-08 DIAGNOSIS — F17200 Nicotine dependence, unspecified, uncomplicated: Secondary | ICD-10-CM | POA: Insufficient documentation

## 2013-09-08 DIAGNOSIS — Z79899 Other long term (current) drug therapy: Secondary | ICD-10-CM | POA: Insufficient documentation

## 2013-09-08 DIAGNOSIS — R002 Palpitations: Secondary | ICD-10-CM | POA: Insufficient documentation

## 2013-09-08 DIAGNOSIS — Z9089 Acquired absence of other organs: Secondary | ICD-10-CM | POA: Insufficient documentation

## 2013-09-08 DIAGNOSIS — J42 Unspecified chronic bronchitis: Secondary | ICD-10-CM | POA: Insufficient documentation

## 2013-09-08 LAB — APTT: aPTT: 34 seconds (ref 24–37)

## 2013-09-08 LAB — CBC
HCT: 41 % (ref 36.0–46.0)
Hemoglobin: 13.6 g/dL (ref 12.0–15.0)
MCH: 23.3 pg — AB (ref 26.0–34.0)
MCHC: 33.2 g/dL (ref 30.0–36.0)
MCV: 70.3 fL — ABNORMAL LOW (ref 78.0–100.0)
PLATELETS: 306 10*3/uL (ref 150–400)
RBC: 5.83 MIL/uL — AB (ref 3.87–5.11)
RDW: 14.7 % (ref 11.5–15.5)
WBC: 14.6 10*3/uL — AB (ref 4.0–10.5)

## 2013-09-08 LAB — PROTIME-INR
INR: 1.03 (ref 0.00–1.49)
PROTHROMBIN TIME: 13.3 s (ref 11.6–15.2)

## 2013-09-08 MED ORDER — FENTANYL CITRATE 0.05 MG/ML IJ SOLN
INTRAMUSCULAR | Status: AC | PRN
  Administered 2013-09-08: 100 ug via INTRAVENOUS
  Administered 2013-09-08: 50 ug via INTRAVENOUS

## 2013-09-08 MED ORDER — LIDOCAINE HCL 1 % IJ SOLN
INTRAMUSCULAR | Status: AC
  Filled 2013-09-08: qty 20

## 2013-09-08 MED ORDER — LIDOCAINE-EPINEPHRINE (PF) 2 %-1:200000 IJ SOLN
INTRAMUSCULAR | Status: DC
Start: 2013-09-08 — End: 2013-09-09
  Filled 2013-09-08: qty 20

## 2013-09-08 MED ORDER — HEPARIN SOD (PORK) LOCK FLUSH 100 UNIT/ML IV SOLN
500.0000 [IU] | Freq: Once | INTRAVENOUS | Status: AC
  Administered 2013-09-08: 500 [IU] via INTRAVENOUS

## 2013-09-08 MED ORDER — FENTANYL CITRATE 0.05 MG/ML IJ SOLN
INTRAMUSCULAR | Status: AC
  Filled 2013-09-08: qty 4

## 2013-09-08 MED ORDER — MIDAZOLAM HCL 2 MG/2ML IJ SOLN
INTRAMUSCULAR | Status: AC
  Filled 2013-09-08: qty 4

## 2013-09-08 MED ORDER — CEFAZOLIN SODIUM-DEXTROSE 2-3 GM-% IV SOLR
2.0000 g | INTRAVENOUS | Status: AC
  Administered 2013-09-08: 2 g via INTRAVENOUS
  Filled 2013-09-08: qty 50

## 2013-09-08 MED ORDER — HEPARIN SOD (PORK) LOCK FLUSH 100 UNIT/ML IV SOLN
INTRAVENOUS | Status: AC
  Filled 2013-09-08: qty 5

## 2013-09-08 MED ORDER — MIDAZOLAM HCL 2 MG/2ML IJ SOLN
INTRAMUSCULAR | Status: AC | PRN
  Administered 2013-09-08: 1 mg via INTRAVENOUS
  Administered 2013-09-08: 2 mg via INTRAVENOUS
  Administered 2013-09-08: 1 mg via INTRAVENOUS

## 2013-09-08 MED ORDER — SODIUM CHLORIDE 0.9 % IV SOLN
INTRAVENOUS | Status: DC
  Administered 2013-09-08: 13:00:00 via INTRAVENOUS

## 2013-09-08 NOTE — H&P (Signed)
Paula Navarro is an 63 y.o. female.   Chief Complaint: "I'm here to get a port a cath" HPI: Patient with history of bilateral breast carcinoma presents today for port a cath placement for chemotherapy.  Past Medical History  Diagnosis Date  . Chronic bronchitis   . Palpitation     Tachycardia reported by monitor clerk during a symptomatic spell  . Chest pain     Admitted to [facility] in 09/2011; refused stress test  . Atrial septal defect 1996    Surgical repair in 1996  . Tobacco abuse     60 pack years; 1.5 packs per day  . Anxiety   . Anemia     Past Surgical History  Procedure Laterality Date  . Cholecystectomy    . Cesarean section    . Vein bypass surgery    . Breast biopsy Bilateral     Family History  Problem Relation Age of Onset  . Breast cancer Maternal Aunt    Social History:  reports that she has been smoking Cigarettes.  She has a 60 pack-year smoking history. She has never used smokeless tobacco. She reports that she does not drink alcohol or use illicit drugs.  Allergies:  Allergies  Allergen Reactions  . Codeine Other (See Comments)    Hot in chest    Current outpatient prescriptions:HYDROmorphone (DILAUDID) 2 MG tablet, Take 1 tablet (2 mg total) by mouth every 6 (six) hours as needed for severe pain., Disp: 30 tablet, Rfl: 0;  dexamethasone (DECADRON) 4 MG tablet, Take 4 mg by mouth 2 (two) times daily with a meal., Disp: , Rfl: ;  lidocaine-prilocaine (EMLA) cream, Apply 1 application topically as needed., Disp: , Rfl:  LORazepam (ATIVAN) 1 MG tablet, Take 1 tablet (1 mg total) by mouth every 6 (six) hours as needed for anxiety., Disp: 30 tablet, Rfl: 1;  prochlorperazine (COMPAZINE) 10 MG tablet, Take 1 tablet (10 mg total) by mouth every 6 (six) hours as needed (Nausea or vomiting)., Disp: 30 tablet, Rfl: 1 Current facility-administered medications:0.9 %  sodium chloride infusion, , Intravenous, Continuous, D Kevin Charlsey Moragne, PA-C, Last Rate: 20 mL/hr at 09/08/13  1302;  ceFAZolin (ANCEF) IVPB 2 g/50 mL premix, 2 g, Intravenous, On Call, D Rowe Robert, PA-C   Results for orders placed during the hospital encounter of 09/08/13 (from the past 48 hour(s))  APTT     Status: None   Collection Time    09/08/13  1:00 PM      Result Value Range   aPTT 34  24 - 37 seconds  CBC     Status: Abnormal   Collection Time    09/08/13  1:00 PM      Result Value Range   WBC 14.6 (*) 4.0 - 10.5 K/uL   RBC 5.83 (*) 3.87 - 5.11 MIL/uL   Hemoglobin 13.6  12.0 - 15.0 g/dL   HCT 41.0  36.0 - 46.0 %   MCV 70.3 (*) 78.0 - 100.0 fL   MCH 23.3 (*) 26.0 - 34.0 pg   MCHC 33.2  30.0 - 36.0 g/dL   RDW 14.7  11.5 - 15.5 %   Platelets 306  150 - 400 K/uL  PROTIME-INR     Status: None   Collection Time    09/08/13  1:00 PM      Result Value Range   Prothrombin Time 13.3  11.6 - 15.2 seconds   INR 1.03  0.00 - 1.49   No results found.  Review of Systems  Constitutional: Negative for fever and chills.  Respiratory: Negative for cough and shortness of breath.   Cardiovascular:       Occ mid chest discomfort as well as breast pain, more on left  Gastrointestinal: Negative for nausea, vomiting and abdominal pain.  Musculoskeletal: Negative for back pain.  Neurological: Negative for headaches.  Endo/Heme/Allergies: Does not bruise/bleed easily.  Psychiatric/Behavioral: The patient is nervous/anxious.     Blood pressure 128/80, pulse 84, temperature 98.6 F (37 C), temperature source Oral, resp. rate 20, last menstrual period 05/20/2011, SpO2 95.00%. Physical Exam  Constitutional: She is oriented to person, place, and time. She appears well-developed and well-nourished.  Cardiovascular: Normal rate and regular rhythm.   Respiratory: Effort normal and breath sounds normal.  Hx bilateral breast masses  GI: Soft. Bowel sounds are normal. There is no tenderness.  Musculoskeletal: Normal range of motion. She exhibits no edema.  Neurological: She is alert and oriented to  person, place, and time.       Assessment/Plan Patient with history of bilateral breast carcinoma presents today for port a cath placement for chemotherapy. Details/risks of procedure d/w pt/family with their understanding and consent.  Paula Navarro,D KEVIN 09/08/2013, 1:58 PM

## 2013-09-08 NOTE — Procedures (Signed)
Interventional Radiology Procedure Note  Procedure: Placement of a right IJ approach single lumen PowerPort.  Tip is positioned in upper RA and catheter is ready for immediate use.  Complications: No immediate Recommendations:  - Ok to shower tomorrow - Do not submerge for 7 days - Routine line care   Signed,  Criselda Peaches, MD [facility]

## 2013-09-08 NOTE — Discharge Instructions (Signed)
Conscious Sedation, Adult, Care After Refer to this sheet in the next few weeks. These instructions provide you with information on caring for yourself after your procedure. Your health care provider may also give you more specific instructions. Your treatment has been planned according to current medical practices, but problems sometimes occur. Call your health care provider if you have any problems or questions after your procedure. WHAT TO EXPECT AFTER THE PROCEDURE  After your procedure:  You may feel sleepy, clumsy, and have poor balance for several hours.  Vomiting may occur if you eat too soon after the procedure. HOME CARE INSTRUCTIONS  Do not participate in any activities where you could become injured for at least 24 hours. Do not:  Drive.  Swim.  Ride a bicycle.  Operate heavy machinery.  Cook.  Use power tools.  Climb ladders.  Work from a high place.  Do not make important decisions or sign legal documents until you are improved.  If you vomit, drink water, juice, or soup when you can drink without vomiting. Make sure you have little or no nausea before eating solid foods.  Only take over-the-counter or prescription medicines for pain, discomfort, or fever as directed by your health care provider.  Make sure you and your family fully understand everything about the medicines given to you, including what side effects may occur.  You should not drink alcohol, take sleeping pills, or take medicines that cause drowsiness for at least 24 hours.  If you smoke, do not smoke without supervision.  If you are feeling better, you may resume normal activities 24 hours after you were sedated.  Keep all appointments with your health care provider. SEEK MEDICAL CARE IF:  Your skin is pale or bluish in color.  You continue to feel nauseous or vomit.  Your pain is getting worse and is not helped by medicine.  You have bleeding or swelling.  You are still sleepy or  feeling clumsy after 24 hours. SEEK IMMEDIATE MEDICAL CARE IF:  You develop a rash.  You have difficulty breathing.  You develop any type of allergic problem.  You have a fever. MAKE SURE YOU:  Understand these instructions.  Will watch your condition.  Will get help right away if you are not doing well or get worse. Document Released: 05/07/2013 Document Reviewed: 02/21/2013 [facility] Patient Information 2014 [place], [place]. Implanted Port Insertion, Care After Refer to this sheet in the next few weeks. These instructions provide you with information on caring for yourself after your procedure. Your health care provider may also give you more specific instructions. Your treatment has been planned according to current medical practices, but problems sometimes occur. Call your health care provider if you have any problems or questions after your procedure. WHAT TO EXPECT AFTER THE PROCEDURE After your procedure, it is typical to have the following:   Discomfort at the port insertion site. Ice packs to the area will help.  Bruising on the skin over the port. This will subside in 3 4 days. HOME CARE INSTRUCTIONS  After your port is placed, you will get a manufacturer's information card. The card has information about your port. Keep this card with you at all times.   Know what kind of port you have. There are many types of ports available.   Wear a medical alert bracelet in case of an emergency. This can help alert health care workers that you have a port.   The port can stay in for as long  as your health care provider believes it is necessary.   A home health care nurse may give medicines and take care of the port.   You or a family member can get special training and directions for giving medicine and taking care of the port at home.  SEEK MEDICAL CARE IF:  Your port does not flush or you are unable to get a blood return.   SEEK IMMEDIATE MEDICAL CARE IF:  You have  new fluid or pus coming from your incision.   You notice a bad smell coming from your incision site.   You have swelling, pain, or more redness at the incision or port site.   You have a fever or chills.   You have chest pain or shortness of breath. Document Released: 05/07/2013 Document Reviewed: 03/24/2013 [facility] Patient Information 2014 [place], [place].

## 2013-09-11 ENCOUNTER — Telehealth: Payer: Self-pay | Admitting: *Deleted

## 2013-09-11 NOTE — Telephone Encounter (Signed)
Received phone call from patient but not stating what she needs. Number she is calling on is [phone]. I have tried several times to return call but the voicemail is not set up to leave messages. Will continue to try calling patient.
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
## 2013-09-15 ENCOUNTER — Ambulatory Visit (HOSPITAL_BASED_OUTPATIENT_CLINIC_OR_DEPARTMENT_OTHER): Payer: Medicare Other | Admitting: Oncology

## 2013-09-15 ENCOUNTER — Other Ambulatory Visit (HOSPITAL_BASED_OUTPATIENT_CLINIC_OR_DEPARTMENT_OTHER): Payer: Medicare Other

## 2013-09-15 ENCOUNTER — Ambulatory Visit (HOSPITAL_BASED_OUTPATIENT_CLINIC_OR_DEPARTMENT_OTHER): Payer: Medicare Other

## 2013-09-15 ENCOUNTER — Other Ambulatory Visit: Payer: Medicare Other

## 2013-09-15 ENCOUNTER — Telehealth: Payer: Self-pay | Admitting: *Deleted

## 2013-09-15 ENCOUNTER — Encounter: Payer: Self-pay | Admitting: Oncology

## 2013-09-15 ENCOUNTER — Other Ambulatory Visit: Payer: Self-pay | Admitting: Emergency Medicine

## 2013-09-15 VITALS — BP 138/83 | HR 82 | Temp 98.6°F | Resp 20 | Ht 60.0 in | Wt 129.8 lb

## 2013-09-15 DIAGNOSIS — C50912 Malignant neoplasm of unspecified site of left female breast: Principal | ICD-10-CM

## 2013-09-15 DIAGNOSIS — C50419 Malignant neoplasm of upper-outer quadrant of unspecified female breast: Secondary | ICD-10-CM

## 2013-09-15 DIAGNOSIS — C50919 Malignant neoplasm of unspecified site of unspecified female breast: Secondary | ICD-10-CM

## 2013-09-15 DIAGNOSIS — C773 Secondary and unspecified malignant neoplasm of axilla and upper limb lymph nodes: Secondary | ICD-10-CM

## 2013-09-15 DIAGNOSIS — Z5111 Encounter for antineoplastic chemotherapy: Secondary | ICD-10-CM

## 2013-09-15 DIAGNOSIS — C50911 Malignant neoplasm of unspecified site of right female breast: Secondary | ICD-10-CM

## 2013-09-15 DIAGNOSIS — Q2111 Secundum atrial septal defect: Secondary | ICD-10-CM

## 2013-09-15 DIAGNOSIS — F411 Generalized anxiety disorder: Secondary | ICD-10-CM

## 2013-09-15 DIAGNOSIS — Q211 Atrial septal defect: Secondary | ICD-10-CM

## 2013-09-15 DIAGNOSIS — N644 Mastodynia: Secondary | ICD-10-CM

## 2013-09-15 DIAGNOSIS — F17200 Nicotine dependence, unspecified, uncomplicated: Secondary | ICD-10-CM

## 2013-09-15 LAB — COMPREHENSIVE METABOLIC PANEL (CC13)
ALT: 13 U/L (ref 0–55)
ANION GAP: 10 meq/L (ref 3–11)
AST: 19 U/L (ref 5–34)
Albumin: 3.7 g/dL (ref 3.5–5.0)
Alkaline Phosphatase: 78 U/L (ref 40–150)
BUN: 6.7 mg/dL — ABNORMAL LOW (ref 7.0–26.0)
CALCIUM: 9.6 mg/dL (ref 8.4–10.4)
CHLORIDE: 108 meq/L (ref 98–109)
CO2: 23 mEq/L (ref 22–29)
Creatinine: 0.8 mg/dL (ref 0.6–1.1)
Glucose: 95 mg/dl (ref 70–140)
Potassium: 3.8 mEq/L (ref 3.5–5.1)
Sodium: 141 mEq/L (ref 136–145)
Total Bilirubin: 0.39 mg/dL (ref 0.20–1.20)
Total Protein: 7.1 g/dL (ref 6.4–8.3)

## 2013-09-15 LAB — CBC WITH DIFFERENTIAL/PLATELET
BASO%: 0.4 % (ref 0.0–2.0)
Basophils Absolute: 0.1 10*3/uL (ref 0.0–0.1)
EOS%: 2 % (ref 0.0–7.0)
Eosinophils Absolute: 0.2 10*3/uL (ref 0.0–0.5)
HCT: 40.6 % (ref 34.8–46.6)
HGB: 13.2 g/dL (ref 11.6–15.9)
LYMPH%: 32.2 % (ref 14.0–49.7)
MCH: 22.7 pg — ABNORMAL LOW (ref 25.1–34.0)
MCHC: 32.5 g/dL (ref 31.5–36.0)
MCV: 69.9 fL — AB (ref 79.5–101.0)
MONO#: 0.7 10*3/uL (ref 0.1–0.9)
MONO%: 5.5 % (ref 0.0–14.0)
NEUT#: 7.1 10*3/uL — ABNORMAL HIGH (ref 1.5–6.5)
NEUT%: 59.9 % (ref 38.4–76.8)
PLATELETS: 272 10*3/uL (ref 145–400)
RBC: 5.81 10*6/uL — ABNORMAL HIGH (ref 3.70–5.45)
RDW: 14.9 % — ABNORMAL HIGH (ref 11.2–14.5)
WBC: 11.9 10*3/uL — AB (ref 3.9–10.3)
lymph#: 3.8 10*3/uL — ABNORMAL HIGH (ref 0.9–3.3)
nRBC: 0 % (ref 0–0)

## 2013-09-15 MED ORDER — HYDROMORPHONE HCL 2 MG PO TABS: 2.0000 mg | ORAL_TABLET | Freq: Four times a day (QID) | ORAL | Status: DC | PRN

## 2013-09-15 MED ORDER — PALONOSETRON HCL INJECTION 0.25 MG/5ML
0.2500 mg | Freq: Once | INTRAVENOUS | Status: AC
  Administered 2013-09-15: 0.25 mg via INTRAVENOUS

## 2013-09-15 MED ORDER — SODIUM CHLORIDE 0.9 % IJ SOLN
10.0000 mL | INTRAMUSCULAR | Status: DC | PRN
  Administered 2013-09-15: 10 mL
  Filled 2013-09-15: qty 10

## 2013-09-15 MED ORDER — EPIRUBICIN HCL CHEMO IV INJECTION 200 MG/100ML
100.0000 mg/m2 | Freq: Once | INTRAVENOUS | Status: AC
  Administered 2013-09-15: 158 mg via INTRAVENOUS
  Filled 2013-09-15: qty 79

## 2013-09-15 MED ORDER — DEXAMETHASONE SODIUM PHOSPHATE 20 MG/5ML IJ SOLN
12.0000 mg | Freq: Once | INTRAMUSCULAR | Status: AC
  Administered 2013-09-15: 12 mg via INTRAVENOUS

## 2013-09-15 MED ORDER — PALONOSETRON HCL INJECTION 0.25 MG/5ML
INTRAVENOUS | Status: AC
  Filled 2013-09-15: qty 5

## 2013-09-15 MED ORDER — HEPARIN SOD (PORK) LOCK FLUSH 100 UNIT/ML IV SOLN
500.0000 [IU] | Freq: Once | INTRAVENOUS | Status: AC | PRN
  Administered 2013-09-15: 500 [IU]
  Filled 2013-09-15: qty 5

## 2013-09-15 MED ORDER — FLUOROURACIL CHEMO INJECTION 2.5 GM/50ML
500.0000 mg/m2 | Freq: Once | INTRAVENOUS | Status: AC
  Administered 2013-09-15: 800 mg via INTRAVENOUS
  Filled 2013-09-15: qty 16

## 2013-09-15 MED ORDER — SODIUM CHLORIDE 0.9 % IV SOLN
Freq: Once | INTRAVENOUS | Status: AC
  Administered 2013-09-15: 12:00:00 via INTRAVENOUS

## 2013-09-15 MED ORDER — DEXAMETHASONE SODIUM PHOSPHATE 20 MG/5ML IJ SOLN
INTRAMUSCULAR | Status: AC
  Filled 2013-09-15: qty 5

## 2013-09-15 MED ORDER — CYCLOPHOSPHAMIDE CHEMO INJECTION 1 GM
500.0000 mg/m2 | Freq: Once | INTRAMUSCULAR | Status: AC
  Administered 2013-09-15: 800 mg via INTRAVENOUS
  Filled 2013-09-15: qty 40

## 2013-09-15 MED ORDER — SODIUM CHLORIDE 0.9 % IV SOLN
150.0000 mg | Freq: Once | INTRAVENOUS | Status: AC
  Administered 2013-09-15: 150 mg via INTRAVENOUS
  Filled 2013-09-15: qty 5

## 2013-09-15 NOTE — Telephone Encounter (Signed)
error 

## 2013-09-15 NOTE — Telephone Encounter (Signed)
Dilaudid RX not given to patient per Dr Humphrey Rolls. Rx shredded and disposed of in proper trash bin.

## 2013-09-15 NOTE — Patient Instructions (Signed)
Smoking Cessation Quitting smoking is important to your health and has many advantages. However, it is not always easy to quit since nicotine is a very addictive drug. Often times, people try 3 times or more before being able to quit. This document explains the best ways for you to prepare to quit smoking. Quitting takes hard work and a lot of effort, but you can do it. ADVANTAGES OF QUITTING SMOKING  You will live longer, feel better, and live better.  Your body will feel the impact of quitting smoking almost immediately.  Within 20 minutes, blood pressure decreases. Your pulse returns to its normal level.  After 8 hours, carbon monoxide levels in the blood return to normal. Your oxygen level increases.  After 24 hours, the chance of having a heart attack starts to decrease. Your breath, hair, and body stop smelling like smoke.  After 48 hours, damaged nerve endings begin to recover. Your sense of taste and smell improve.  After 72 hours, the body is virtually free of nicotine. Your bronchial tubes relax and breathing becomes easier.  After 2 to 12 weeks, lungs can hold more air. Exercise becomes easier and circulation improves.  The risk of having a heart attack, stroke, cancer, or lung disease is greatly reduced.  After 1 year, the risk of coronary heart disease is cut in half.  After 5 years, the risk of stroke falls to the same as a nonsmoker.  After 10 years, the risk of lung cancer is cut in half and the risk of other cancers decreases significantly.  After 15 years, the risk of coronary heart disease drops, usually to the level of a nonsmoker.  If you are pregnant, quitting smoking will improve your chances of having a healthy baby.  The people you live with, especially any children, will be healthier.  You will have extra money to spend on things other than cigarettes. QUESTIONS TO THINK ABOUT BEFORE ATTEMPTING TO QUIT You may want to talk about your answers with your  caregiver.  Why do you want to quit?  If you tried to quit in the past, what helped and what did not?  What will be the most difficult situations for you after you quit? How will you plan to handle them?  Who can help you through the tough times? Your family? Friends? A caregiver?  What pleasures do you get from smoking? What ways can you still get pleasure if you quit? Here are some questions to ask your caregiver:  How can you help me to be successful at quitting?  What medicine do you think would be best for me and how should I take it?  What should I do if I need more help?  What is smoking withdrawal like? How can I get information on withdrawal? GET READY  Set a quit date.  Change your environment by getting rid of all cigarettes, ashtrays, matches, and lighters in your home, car, or work. Do not let people smoke in your home.  Review your past attempts to quit. Think about what worked and what did not. GET SUPPORT AND ENCOURAGEMENT You have a better chance of being successful if you have help. You can get support in many ways.  Tell your family, friends, and co-workers that you are going to quit and need their support. Ask them not to smoke around you.  Get individual, group, or telephone counseling and support. Programs are available at local hospitals and health centers. Call your local health department for   information about programs in your area.  Spiritual beliefs and practices may help some smokers quit.  Download a "quit meter" on your computer to keep track of quit statistics, such as how long you have gone without smoking, cigarettes not smoked, and money saved.  Get a self-help book about quitting smoking and staying off of tobacco. LEARN NEW SKILLS AND BEHAVIORS  Distract yourself from urges to smoke. Talk to someone, go for a walk, or occupy your time with a task.  Change your normal routine. Take a different route to work. Drink tea instead of coffee.  Eat breakfast in a different place.  Reduce your stress. Take a hot bath, exercise, or read a book.  Plan something enjoyable to do every day. Reward yourself for not smoking.  Explore interactive web-based programs that specialize in helping you quit. GET MEDICINE AND USE IT CORRECTLY Medicines can help you stop smoking and decrease the urge to smoke. Combining medicine with the above behavioral methods and support can greatly increase your chances of successfully quitting smoking.  Nicotine replacement therapy helps deliver nicotine to your body without the negative effects and risks of smoking. Nicotine replacement therapy includes nicotine gum, lozenges, inhalers, nasal sprays, and skin patches. Some may be available over-the-counter and others require a prescription.  Antidepressant medicine helps people abstain from smoking, but how this works is unknown. This medicine is available by prescription.  Nicotinic receptor partial agonist medicine simulates the effect of nicotine in your brain. This medicine is available by prescription. Ask your caregiver for advice about which medicines to use and how to use them based on your health history. Your caregiver will tell you what side effects to look out for if you choose to be on a medicine or therapy. Carefully read the information on the package. Do not use any other product containing nicotine while using a nicotine replacement product.  RELAPSE OR DIFFICULT SITUATIONS Most relapses occur within the first 3 months after quitting. Do not be discouraged if you start smoking again. Remember, most people try several times before finally quitting. You may have symptoms of withdrawal because your body is used to nicotine. You may crave cigarettes, be irritable, feel very hungry, cough often, get headaches, or have difficulty concentrating. The withdrawal symptoms are only temporary. They are strongest when you first quit, but they will go away within  10 14 days. To reduce the chances of relapse, try to:  Avoid drinking alcohol. Drinking lowers your chances of successfully quitting.  Reduce the amount of caffeine you consume. Once you quit smoking, the amount of caffeine in your body increases and can give you symptoms, such as a rapid heartbeat, sweating, and anxiety.  Avoid smokers because they can make you want to smoke.  Do not let weight gain distract you. Many smokers will gain weight when they quit, usually less than 10 pounds. Eat a healthy diet and stay active. You can always lose the weight gained after you quit.  Find ways to improve your mood other than smoking. FOR MORE INFORMATION  www.smokefree.gov  Document Released: 07/11/2001 Document Revised: 01/16/2012 Document Reviewed: 10/26/2011 [employer] Patient Information 2014 [employer].  

## 2013-09-15 NOTE — Progress Notes (Signed)
Per Dr Humphrey Rolls, patient needs to come to visit on 2/16 prior to receiving refills on Dilaudid. Patient's son aware.

## 2013-09-15 NOTE — Telephone Encounter (Signed)
Received multiple phone calls from patient stating she didn't want to come in due to the weather and having to come in tomorrow for her injection.  She states her son is here now to pick up pain medication for her.  Explained to her that she needs to come in and discuss pain management with Dr. Humphrey Rolls and that the weather was not supposed to start to later this evening and she would be done before anything started. I also offered her an appointment to come in Wednesday for her injection or I could arrange for her to have it done at [facility] so she would not have to drive here.  She states she would come in.  Informed her son to wait per his mother that she was coming.  By the time I went to the lobby she had already called her son and informed him she didn't know if she was coming.

## 2013-09-15 NOTE — Patient Instructions (Signed)
[place] Discharge Instructions for Patients Receiving Chemotherapy  Today you received the following chemotherapy agents: adrucil, epirubicin, cytoxan  To help prevent nausea and vomiting after your treatment, we encourage you to take your nausea medication.  Take it as often as prescribed.     If you develop nausea and vomiting that is not controlled by your nausea medication, call the clinic. If it is after clinic hours your family physician or the after hours number for the clinic or go to the Emergency Department.   BELOW ARE SYMPTOMS THAT SHOULD BE REPORTED IMMEDIATELY:  *FEVER GREATER THAN 100.5 F  *CHILLS WITH OR WITHOUT FEVER  NAUSEA AND VOMITING THAT IS NOT CONTROLLED WITH YOUR NAUSEA MEDICATION  *UNUSUAL SHORTNESS OF BREATH  *UNUSUAL BRUISING OR BLEEDING  TENDERNESS IN MOUTH AND THROAT WITH OR WITHOUT PRESENCE OF ULCERS  *URINARY PROBLEMS  *BOWEL PROBLEMS  UNUSUAL RASH Items with * indicate a potential emergency and should be followed up as soon as possible.  One of the nurses will contact you 24 hours after your treatment. Please let the nurse know about any problems that you may have experienced. Feel free to call the clinic you have any questions or concerns. The clinic phone number is [phone].   I have been informed and understand all the instructions given to me. I know to contact the clinic, my physician, or go to the Emergency Department if any problems should occur. I do not have any questions at this time, but understand that I may call the clinic during office hours   should I have any questions or need assistance in obtaining follow up care.    __________________________________________  _____________  __________ Signature of Patient or Authorized Representative            Date                   Time    __________________________________________ Nurse's Signature   Fluorouracil, 5-FU injection (Adrucil) What is this  medicine? FLUOROURACIL, 5-FU (flure oh YOOR a sil) is a chemotherapy drug. It slows the growth of cancer cells. This medicine is used to treat many types of cancer like breast cancer, colon or rectal cancer, pancreatic cancer, and stomach cancer. This medicine may be used for other purposes; ask your health care provider or pharmacist if you have questions. COMMON BRAND NAME(S): Adrucil What should I tell my health care provider before I take this medicine? They need to know if you have any of these conditions: -blood disorders -dihydropyrimidine dehydrogenase (DPD) deficiency -infection (especially a virus infection such as chickenpox, cold sores, or herpes) -kidney disease -liver disease -malnourished, poor nutrition -recent or ongoing radiation therapy -an unusual or allergic reaction to fluorouracil, other chemotherapy, other medicines, foods, dyes, or preservatives -pregnant or trying to get pregnant -breast-feeding How should I use this medicine? This drug is given as an infusion or injection into a vein. It is administered in a hospital or clinic by a specially trained health care professional. Talk to your pediatrician regarding the use of this medicine in children. Special care may be needed. Overdosage: If you think you have taken too much of this medicine contact a poison control center or emergency room at once. NOTE: This medicine is only for you. Do not share this medicine with others. What if I miss a dose? It is important not to miss your dose. Call your doctor or health care professional if you are unable to keep an appointment.  What may interact with this medicine? -allopurinol -cimetidine -dapsone -digoxin -hydroxyurea -leucovorin -levamisole -medicines for seizures like ethotoin, fosphenytoin, phenytoin -medicines to increase blood counts like filgrastim, pegfilgrastim, sargramostim -medicines that treat or prevent blood clots like warfarin, enoxaparin, and  dalteparin -methotrexate -metronidazole -pyrimethamine -some other chemotherapy drugs like busulfan, cisplatin, estramustine, vinblastine -trimethoprim -trimetrexate -vaccines Talk to your doctor or health care professional before taking any of these medicines: -acetaminophen -aspirin -ibuprofen -ketoprofen -naproxen This list may not describe all possible interactions. Give your health care provider a list of all the medicines, herbs, non-prescription drugs, or dietary supplements you use. Also tell them if you smoke, drink alcohol, or use illegal drugs. Some items may interact with your medicine. What should I watch for while using this medicine? Visit your doctor for checks on your progress. This drug may make you feel generally unwell. This is not uncommon, as chemotherapy can affect healthy cells as well as cancer cells. Report any side effects. Continue your course of treatment even though you feel ill unless your doctor tells you to stop. In some cases, you may be given additional medicines to help with side effects. Follow all directions for their use. Call your doctor or health care professional for advice if you get a fever, chills or sore throat, or other symptoms of a cold or flu. Do not treat yourself. This drug decreases your body's ability to fight infections. Try to avoid being around people who are sick. This medicine may increase your risk to bruise or bleed. Call your doctor or health care professional if you notice any unusual bleeding. Be careful brushing and flossing your teeth or using a toothpick because you may get an infection or bleed more easily. If you have any dental work done, tell your dentist you are receiving this medicine. Avoid taking products that contain aspirin, acetaminophen, ibuprofen, naproxen, or ketoprofen unless instructed by your doctor. These medicines may hide a fever. Do not become pregnant while taking this medicine. Women should inform their  doctor if they wish to become pregnant or think they might be pregnant. There is a potential for serious side effects to an unborn child. Talk to your health care professional or pharmacist for more information. Do not breast-feed an infant while taking this medicine. Men should inform their doctor if they wish to father a child. This medicine may lower sperm counts. Do not treat diarrhea with over the counter products. Contact your doctor if you have diarrhea that lasts more than 2 days or if it is severe and watery. This medicine can make you more sensitive to the sun. Keep out of the sun. If you cannot avoid being in the sun, wear protective clothing and use sunscreen. Do not use sun lamps or tanning beds/booths. What side effects may I notice from receiving this medicine? Side effects that you should report to your doctor or health care professional as soon as possible: -allergic reactions like skin rash, itching or hives, swelling of the face, lips, or tongue -low blood counts - this medicine may decrease the number of white blood cells, red blood cells and platelets. You may be at increased risk for infections and bleeding. -signs of infection - fever or chills, cough, sore throat, pain or difficulty passing urine -signs of decreased platelets or bleeding - bruising, pinpoint red spots on the skin, black, tarry stools, blood in the urine -signs of decreased red blood cells - unusually weak or tired, fainting spells, lightheadedness -breathing problems -changes in vision -  chest pain -mouth sores -nausea and vomiting -pain, swelling, redness at site where injected -pain, tingling, numbness in the hands or feet -redness, swelling, or sores on hands or feet -stomach pain -unusual bleeding Side effects that usually do not require medical attention (report to your doctor or health care professional if they continue or are bothersome): -changes in finger or toe nails -diarrhea -dry or itchy  skin -hair loss -headache -loss of appetite -sensitivity of eyes to the light -stomach upset -unusually teary eyes This list may not describe all possible side effects. Call your doctor for medical advice about side effects. You may report side effects to FDA at 1-800-FDA-1088. Where should I keep my medicine? This drug is given in a hospital or clinic and will not be stored at home. NOTE: This sheet is a summary. It may not cover all possible information. If you have questions about this medicine, talk to your doctor, pharmacist, or health care provider.  2014, [employer] Standard. (2007-11-20 13:53:16)  Epirubicin injection What is this medicine? EPIRUBICIN (ep i ROO bi sin) is a chemotherapy drug. This medicine is used to treat breast cancer. This medicine may be used for other purposes; ask your health care provider or pharmacist if you have questions. COMMON BRAND NAME(S): Ellence What should I tell my health care provider before I take this medicine? They need to know if you have any of these conditions: -blood disorders -heart disease, recent heart attack -infection (especially a virus infection such as chickenpox, cold sores, or herpes) -irregular heartbeat -kidney disease -liver disease -recent or ongoing radiation therapy -an unusual or allergic reaction to epirubicin, other chemotherapy agents, other medicines, foods, dyes, or preservatives -pregnant or trying to get pregnant -breast-feeding How should I use this medicine? This drug is given as an infusion into a vein. It is administered in a hospital or clinic by a specially trained health care professional. If you have pain, swelling, burning or any unusual feeling around the site of your injection, tell your health care professional right away. Talk to your pediatrician regarding the use of this medicine in children. Special care may be needed. Overdosage: If you think you have taken too much of this medicine  contact a poison control center or emergency room at once. NOTE: This medicine is only for you. Do not share this medicine with others. What if I miss a dose? It is important not to miss your dose. Call your doctor or health care professional if you are unable to keep an appointment. What may interact with this medicine? Do not take this medicine with any of the following medications: -cisapride -droperidol -halofantrine -pimozide This medicine may also interact with the following medications: -chloroquine -chlorpromazine -clarithromycin -cimetidine -cyclosporine -erythromycin -medicines for blood pressure like amlodipine, felodipine, nifedipine -medicines for depression, anxiety, or psychotic disturbances -medicines for irregular heart beat like amiodarone, bepridil, dofetilide, encainide, flecainide, propafenone, quinidine -medicines for nausea, vomiting like dolasetron, ondansetron, palonosetron -medicines to increase blood counts like filgrastim, pegfilgrastim, sargramostim -methadone -methotrexate -pentamidine -vaccines Talk to your doctor or health care professional before taking any of these medicines: -acetaminophen -aspirin -ibuprofen -ketoprofen -naproxen This list may not describe all possible interactions. Give your health care provider a list of all the medicines, herbs, non-prescription drugs, or dietary supplements you use. Also tell them if you smoke, drink alcohol, or use illegal drugs. Some items may interact with your medicine. What should I watch for while using this medicine? Your condition will be monitored carefully while you are  receiving this medicine. You will need important blood work done while you are taking this medicine. This drug may make you feel generally unwell. This is not uncommon, as chemotherapy can affect healthy cells as well as cancer cells. Report any side effects. Continue your course of treatment even though you feel ill unless your  doctor tells you to stop. Your urine may turn red for a few days after your dose. This is not blood. If your urine is dark or brown, call your doctor. In some cases, you may be given additional medicines to help with side effects. Follow all directions for their use. Call your doctor or health care professional for advice if you get a fever, chills or sore throat, or other symptoms of a cold or flu. Do not treat yourself. This drug decreases your body's ability to fight infections. Try to avoid being around people who are sick. This medicine may increase your risk to bruise or bleed. Call your doctor or health care professional if you notice any unusual bleeding. Be careful brushing and flossing your teeth or using a toothpick because you may get an infection or bleed more easily. If you have any dental work done, tell your dentist you are receiving this medicine. Avoid taking products that contain aspirin, acetaminophen, ibuprofen, naproxen, or ketoprofen unless instructed by your doctor. These medicines may hide a fever. Men and women of childbearing age should use effective birth control methods while using taking this medicine. Do not become pregnant while taking this medicine. There is a potential for serious side effects to an unborn child. Talk to your health care professional or pharmacist for more information. Do not breast-feed an infant while taking this medicine. There is a maximum amount of this medicine you should receive throughout your life. The amount depends on the medical condition being treated and your overall health. Your doctor will watch how much of this medicine you receive in your lifetime. Tell your doctor if you have taken this medicine before. What side effects may I notice from receiving this medicine? Side effects that you should report to your doctor or health care professional as soon as possible: -allergic reactions like skin rash, itching or hives, swelling of the face,  lips, or tongue -low blood counts - this medicine may decrease the number of white blood cells, red blood cells and platelets. You may be at increased risk for infections and bleeding. -signs of infection - fever or chills, cough, sore throat, pain or difficulty passing urine -signs of decreased platelets or bleeding - bruising, pinpoint red spots on the skin, black, tarry stools, blood in the urine -signs of decreased red blood cells - unusually weak or tired, fainting spells, lightheadedness -breathing problems -chest pain -gout pain -fast, irregular heartbeat -mouth sores -pain, swelling, redness at site where injected -swelling of ankles, feet, or hands Side effects that usually do not require medical attention (report to your doctor or health care professional if they continue or are bothersome): -changes in skin or nail color -diarrhea -hair loss -hot flashes, facial flushing -increased skin sensitivity to the sun -loss of appetite -nausea, vomiting -red colored urine -stomach upset This list may not describe all possible side effects. Call your doctor for medical advice about side effects. You may report side effects to FDA at 1-800-FDA-1088. Where should I keep my medicine? This drug is given in a hospital or clinic and will not be stored at home. NOTE: This sheet is a summary. It may not  cover all possible information. If you have questions about this medicine, talk to your doctor, pharmacist, or health care provider.  2014, [employer] Standard. (2012-11-12 62:26:33)   Cyclophosphamide injection (cytoxan) What is this medicine? CYCLOPHOSPHAMIDE (sye kloe FOSS fa mide) is a chemotherapy drug. It slows the growth of cancer cells. This medicine is used to treat many types of cancer like lymphoma, myeloma, leukemia, breast cancer, and ovarian cancer, to name a few. This medicine may be used for other purposes; ask your health care provider or pharmacist if you have  questions. COMMON BRAND NAME(S): Cytoxan, Neosar What should I tell my health care provider before I take this medicine? They need to know if you have any of these conditions: -blood disorders -history of other chemotherapy -infection -kidney disease -liver disease -recent or ongoing radiation therapy -tumors in the bone marrow -an unusual or allergic reaction to cyclophosphamide, other chemotherapy, other medicines, foods, dyes, or preservatives -pregnant or trying to get pregnant -breast-feeding How should I use this medicine? This drug is usually given as an injection into a vein or muscle or by infusion into a vein. It is administered in a hospital or clinic by a specially trained health care professional. Talk to your pediatrician regarding the use of this medicine in children. Special care may be needed. Overdosage: If you think you have taken too much of this medicine contact a poison control center or emergency room at once. NOTE: This medicine is only for you. Do not share this medicine with others. What if I miss a dose? It is important not to miss your dose. Call your doctor or health care professional if you are unable to keep an appointment. What may interact with this medicine? This medicine may interact with the following medications: -amiodarone -amphotericin B -azathioprine -certain antiviral medicines for HIV or AIDS such as protease inhibitors (e.g., indinavir, ritonavir) and zidovudine -certain blood pressure medications such as benazepril, captopril, enalapril, fosinopril, lisinopril, moexipril, monopril, perindopril, quinapril, ramipril, trandolapril -certain cancer medications such as anthracyclines (e.g., daunorubicin, doxorubicin), busulfan, cytarabine, paclitaxel, pentostatin, tamoxifen, trastuzumab -certain diuretics such as chlorothiazide, chlorthalidone, hydrochlorothiazide, indapamide, metolazone -certain medicines that treat or prevent blood clots like  warfarin -certain muscle relaxants such as succinylcholine -cyclosporine -etanercept -indomethacin -medicines to increase blood counts like filgrastim, pegfilgrastim, sargramostim -medicines used as general anesthesia -metronidazole -natalizumab This list may not describe all possible interactions. Give your health care provider a list of all the medicines, herbs, non-prescription drugs, or dietary supplements you use. Also tell them if you smoke, drink alcohol, or use illegal drugs. Some items may interact with your medicine. What should I watch for while using this medicine? Visit your doctor for checks on your progress. This drug may make you feel generally unwell. This is not uncommon, as chemotherapy can affect healthy cells as well as cancer cells. Report any side effects. Continue your course of treatment even though you feel ill unless your doctor tells you to stop. Drink water or other fluids as directed. Urinate often, even at night. In some cases, you may be given additional medicines to help with side effects. Follow all directions for their use. Call your doctor or health care professional for advice if you get a fever, chills or sore throat, or other symptoms of a cold or flu. Do not treat yourself. This drug decreases your body's ability to fight infections. Try to avoid being around people who are sick. This medicine may increase your risk to bruise or bleed. Call your doctor  or health care professional if you notice any unusual bleeding. Be careful brushing and flossing your teeth or using a toothpick because you may get an infection or bleed more easily. If you have any dental work done, tell your dentist you are receiving this medicine. You may get drowsy or dizzy. Do not drive, use machinery, or do anything that needs mental alertness until you know how this medicine affects you. Do not become pregnant while taking this medicine or for 1 year after stopping it. Women should  inform their doctor if they wish to become pregnant or think they might be pregnant. Men should not father a child while taking this medicine and for 4 months after stopping it. There is a potential for serious side effects to an unborn child. Talk to your health care professional or pharmacist for more information. Do not breast-feed an infant while taking this medicine. This medicine may interfere with the ability to have a child. This medicine has caused ovarian failure in some women. This medicine has caused reduced sperm counts in some men. You should talk with your doctor or health care professional if you are concerned about your fertility. If you are going to have surgery, tell your doctor or health care professional that you have taken this medicine. What side effects may I notice from receiving this medicine? Side effects that you should report to your doctor or health care professional as soon as possible: -allergic reactions like skin rash, itching or hives, swelling of the face, lips, or tongue -low blood counts - this medicine may decrease the number of white blood cells, red blood cells and platelets. You may be at increased risk for infections and bleeding. -signs of infection - fever or chills, cough, sore throat, pain or difficulty passing urine -signs of decreased platelets or bleeding - bruising, pinpoint red spots on the skin, black, tarry stools, blood in the urine -signs of decreased red blood cells - unusually weak or tired, fainting spells, lightheadedness -breathing problems -dark urine -dizziness -palpitations -swelling of the ankles, feet, hands -trouble passing urine or change in the amount of urine -weight gain -yellowing of the eyes or skin Side effects that usually do not require medical attention (report to your doctor or health care professional if they continue or are bothersome): -changes in nail or skin color -hair loss -missed menstrual periods -mouth  sores -nausea, vomiting This list may not describe all possible side effects. Call your doctor for medical advice about side effects. You may report side effects to FDA at 1-800-FDA-1088. Where should I keep my medicine? This drug is given in a hospital or clinic and will not be stored at home. NOTE: This sheet is a summary. It may not cover all possible information. If you have questions about this medicine, talk to your doctor, pharmacist, or health care provider.  2014, [employer]. (2012-05-31 16:22:58)

## 2013-09-16 ENCOUNTER — Telehealth: Payer: Self-pay | Admitting: *Deleted

## 2013-09-16 ENCOUNTER — Ambulatory Visit: Payer: Medicare Other

## 2013-09-16 NOTE — Telephone Encounter (Signed)
Attempted to call patient at home with no answer.  Called son's number and talked to him about the injection appointment.  Patient was snowed in and would like to reschedule the appointment.  Paula Navarro (her son) set up new appointment for tomorrow 09-17-13 at 1:00 pm.  Son aware of new date and time.

## 2013-09-16 NOTE — Addendum Note (Signed)
Addended by: Cherylynn Ridges on: 09/16/2013 05:48 PM   Modules accepted: Orders, Medications

## 2013-09-16 NOTE — Telephone Encounter (Signed)
Patient can go the pharmacy and get tylenol and ibuprofen for her pain.. We wil not be prescribing her pain meds at this time

## 2013-09-16 NOTE — Telephone Encounter (Signed)
Called Paula Navarro for chemotherapy F/U.  Patient is doing well.  Denies n/v.  Denies any new side effects or symptoms.  Bladder is functioning well.  Eating and drinking well and I instructed to drink 64 oz minimum daily or at least the day before, of and after treatment.  Bowels are not moving but "I am breaking a lot of wind".  Asked if she is taking laxatives due to dilaudid use for pain.  Denies dilaudid and noted note with this order so I will delete from med list.  Discussion veered to pain.  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  "I need medicines to use for pain and do not abuse drugs".  At this time denies pain rating as a zero on pain scale.  Reports being told she will receive tramadol and ibuprofen for pain.  Discussion of someone talking to her son about getting her here for injection and her kids are protective of her, can't make her do anything because "I am the mother.  They lost their father in a car accident.  Expressed to her that these injections are most effective when administered within 72 hours of the chemotherapy treatment.  Will notify providers of her pain medicine needs.  Denies questions at this time and encouraged to call if needed.  Reviewed how to call after hours in the case of an emergency.

## 2013-09-16 NOTE — Progress Notes (Signed)
OFFICE PROGRESS NOTE  CC  Paula Hampshire, MD [place] [address] Dr. Thea Silversmith  Dr. Autumn Messing  DIAGNOSIS:63 year old ER positive breast cancers. Patient is seen in the multidisciplinary breast clinic for discussion of treatment options   STAGE:  Bilateral breast cancer  Primary site: Breast (Bilateral)  Staging method: AJCC 7th Edition  Clinical: Stage IIB (T2, N1, cM0)  Summary: Stage IIB (T2, N1, cM0)     PRIOR THERAPY: 1. Patient states that she felt pain in her breasts and masses. Because of this she went on to have further workup performed including mammograms and ultrasound. On 07/23/2013 patient had bilateral diagnostic mammogram she was noted to have bilateral breast masses on the right irregular mass lying posterior lateral breast associated with a large rounded and dense axillary nodes. The left showed slightly smaller irregular mass lying at the 11:00 to 12:00 position. There was a smaller adjacent cyst. Also noted to have a large dense axillary lymph nodes on the left. Because of this she was recommended ultrasound and biopsies. 08/07/2013 patient underwent ultrasound-guided right breast core needle biopsy will. As well as left eye out see. 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 The lymph node was positive for metastatic disease. On the left patient had a biopsy that also showed invasive ductal carcinoma grade 3 ER positive PR negative HER-2/neu negative with a proliferation marker Ki-67 80%.Her clinical and radiologic staging was T2 NX on the left side T2 N1 on the right side.  2. Patient is seen at the multidisciplinary breast clinic. Neoadjuvant treatment was recommended with chemotherapy consisting of FEC followed by Taxol. Patient has had her Port-A-Cath placed she also had staging studies performed was also recorded. She has  had chemotherapy teaching class. All of her antiemetics were prescribed and she has become up from her pharmacy.  CURRENT THERAPY: Neoadjuvant cycle 1 day 1 of dose dense FEC  INTERVAL HISTORY: Paula Navarro 63 y.o. female returns for followup visit. Clinically she seems to be doing well. Her main concern has been ongoing pain in her breasts. She has been taking significant amount of hydromorphone. This is great concern. I inquired this morning for the exact location and nature of the pain. She relates it to the left breast. She has no pain in the right breast. I have recommended that she take Tylenol or ibuprofen periodically. Certainly I could give her some tramadol as well. I am concerned about the hydromorphone use since she does comment that somebody in her family took half of her prescription as well. She otherwise denies any nausea vomiting fevers chills no night sweats no abdominal pain no peripheral paresthesias. Patient unfortunately continues to smoke. I have discussed smoking cessation with her at several locations and material has been given to her. We also discussed referring her to the smoking cessation classes. 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 We will plan on proceeding with a first cycle of her treatment.  MEDICAL HISTORY: Past Medical History  Diagnosis Date  . Chronic bronchitis   . Palpitation     Tachycardia reported by monitor clerk during a symptomatic spell  . Chest pain     Admitted to [facility] in 09/2011; refused stress test  . Atrial septal defect 1996    Surgical repair in 1996  . Tobacco abuse     60 pack years; 1.5 packs per day  . Anxiety   .  Anemia     ALLERGIES:  is allergic to codeine.  MEDICATIONS:  Current Outpatient Prescriptions  Medication Sig Dispense Refill  . lidocaine-prilocaine (EMLA) cream Apply 1 application topically as needed.      Marland Kitchen LORazepam (ATIVAN) 1 MG tablet Take 1 tablet (1 mg total) by mouth every 6 (six) hours as needed for anxiety.   30 tablet  1  . dexamethasone (DECADRON) 4 MG tablet Take 4 mg by mouth 2 (two) times daily with a meal.      . HYDROmorphone (DILAUDID) 2 MG tablet Take 1 tablet (2 mg total) by mouth every 6 (six) hours as needed for severe pain.  30 tablet  0  . prochlorperazine (COMPAZINE) 10 MG tablet Take 1 tablet (10 mg total) by mouth every 6 (six) hours as needed (Nausea or vomiting).  30 tablet  1   No current facility-administered medications for this visit.    SURGICAL HISTORY:  Past Surgical History  Procedure Laterality Date  . Cholecystectomy    . Cesarean section    . Vein bypass surgery    . Breast biopsy Bilateral     REVIEW OF SYSTEMS:  Pertinent items are noted in HPI.     PHYSICAL EXAMINATION: Blood pressure 138/83, pulse 82, temperature 98.6 F (37 C), temperature source Oral, resp. rate 20, height 5' (1.524 m), weight 129 lb 12.8 oz (58.877 kg), last menstrual period 05/20/2011. Body mass index is 25.35 kg/(m^2). ECOG PERFORMANCE STATUS: 1 - Symptomatic but completely ambulatory  Well-developed nourished female in no acute distress HEENT exam: EOMI PERRLA sclerae anicteric no conjunctival pallor oral mucosa is moist adequate oral dental hygiene no posterior pharyngeal wall erythema Lymph nodes: Lymph node survey in the cervical supraclavicular node was negative patient is found to have palpable lymph node in the axilla Lungs: Clear to auscultation and percussion Cardiovascular: Regular rate rhythm Abdomen: Soft nontender nondistended bowel sounds are present no hepatosplenomegaly Extremities: No clubbing edema or cyanosis Neuro: Patient's alert oriented otherwise nonfocal Breasts: unchanged from previous exams.    LABORATORY DATA: Lab Results  Component Value Date   WBC 11.9* 09/15/2013   HGB 13.2 09/15/2013   HCT 40.6 09/15/2013   MCV 69.9* 09/15/2013   PLT 272 09/15/2013      Chemistry      Component Value Date/Time   NA 141 09/15/2013 1038   NA 141 09/23/2012  0414   K 3.8 09/15/2013 1038   K 3.8 09/23/2012 0414   CL 107 09/23/2012 0414   CO2 23 09/15/2013 1038   CO2 29 10/30/2011 1419   BUN 6.7* 09/15/2013 1038   BUN 7 09/23/2012 0414   CREATININE 0.8 09/15/2013 1038   CREATININE 0.90 09/23/2012 0414      Component Value Date/Time   CALCIUM 9.6 09/15/2013 1038   CALCIUM 10.1 10/30/2011 1419   ALKPHOS 78 09/15/2013 1038   AST 19 09/15/2013 1038   ALT 13 09/15/2013 1038   BILITOT 0.39 09/15/2013 1038       RADIOGRAPHIC STUDIES:  Ct Chest W Contrast  08/22/2013   CLINICAL DATA:  Breast cancer  EXAM: CT CHEST, ABDOMEN, AND PELVIS WITH CONTRAST  TECHNIQUE: Multidetector CT imaging of the chest, abdomen and pelvis was performed following the standard protocol during bolus administration of intravenous contrast.  CONTRAST:  174m OMNIPAQUE IOHEXOL 300 MG/ML  SOLN  COMPARISON:  None.  FINDINGS: CT CHEST FINDINGS  Bulky right axillary lymphadenopathy is evident. Index node in the right axilla measures 2.9 x 1.9 cm.  There is associated bulky right subpectoral lymphadenopathy with a 1.8 x 2.9 cm lymph node identified. In the lower outer aspect of the right breast, a 3.8 x 3.4 cm rim enhancing lesion is identified.  There is lymphadenopathy in the left axilla as well. 1.8 x 1.2 cm left axillary lymph node is noted. There is a necrotic 1.3 x 1.4 cm lymph node in the left subpectoral region. Other smaller left subpectoral lymph nodes are associated. 3.0 x 3.2 cm necrotic lesion in the upper left breast is evident.  9 mm nodule is seen in the anterior right thyroid lobe and there is a 5 mm nodule in the posterior left thyroid lobe. Borderline lymphadenopathy is identified in the prevascular space of the AP window. No subcarinal lymphadenopathy. No hilar lymphadenopathy.  The heart size is enlarged. Patient is status post median sternotomy. There is no pericardial or pleural effusion.  Lung windows demonstrate emphysema. No pulmonary parenchymal nodular mass. No focal areas of  airspace consolidation. The  Bone windows reveal no worrisome lytic or sclerotic osseous lesions.  CT ABDOMEN AND PELVIS FINDINGS  Fatty infiltration of the liver parenchyma is evident. There is mild prominence of the intra hepatic bile ducts without gross dilatation of the extrahepatic common duct. These changes may be related to prior cholecystectomy. Spleen is normal. The stomach and duodenum are unremarkable. There is a 9 x 6 mm hypo attenuating lesion in the tail of the pancreas. The main pancreatic duct is slightly prominent.  No evidence for adrenal nodule or mass. The left kidney is normal. 7 x 13 mm lesion in the right kidney is probably a cyst.  No abdominal aortic aneurysm. No free fluid or lymphadenopathy in the abdomen. There is a small 4 x 8 mm nodule in the right omentum.  Imaging through the pelvis shows no free intraperitoneal fluid. No pelvic sidewall lymphadenopathy. Fibroid changes noted in the uterus. Bladder is unremarkable. No adnexal mass. The terminal ileum is normal. The appendix is normal.  Bone windows reveal no worrisome lytic or sclerotic osseous lesions.  IMPRESSION: Large bilateral breast masses with bulky, sometimes necrotic lymphadenopathy in both subpectoral regions and both axillary regions, consistent with metastatic spread.  Tiny bilateral thyroid nodules. These may not be clinically relevant in this individual.  Mild prominence of the bile ducts and pancreatic duct. The biliary prominence may be secondary to the cholecystectomy. Main pancreatic duct prominence is of indeterminate etiology. There is a 9 x 6 mm hypo attenuating lesion in the tail the pancreas which could represent a tiny pseudocyst or cystic neoplasm.  8 mm right omental nodule is nonspecific. Attention to this area on follow-up imaging is recommended.   Electronically Signed   By: Misty Stanley M.D.   On: 08/22/2013 17:00   Ct Abdomen Pelvis W Contrast  08/22/2013   CLINICAL DATA:  Breast cancer  EXAM: CT  CHEST, ABDOMEN, AND PELVIS WITH CONTRAST  TECHNIQUE: Multidetector CT imaging of the chest, abdomen and pelvis was performed following the standard protocol during bolus administration of intravenous contrast.  CONTRAST:  113m OMNIPAQUE IOHEXOL 300 MG/ML  SOLN  COMPARISON:  None.  FINDINGS: CT CHEST FINDINGS  Bulky right axillary lymphadenopathy is evident. Index node in the right axilla measures 2.9 x 1.9 cm. There is associated bulky right subpectoral lymphadenopathy with a 1.8 x 2.9 cm lymph node identified. In the lower outer aspect of the right breast, a 3.8 x 3.4 cm rim enhancing lesion is identified.  There is lymphadenopathy in  the left axilla as well. 1.8 x 1.2 cm left axillary lymph node is noted. There is a necrotic 1.3 x 1.4 cm lymph node in the left subpectoral region. Other smaller left subpectoral lymph nodes are associated. 3.0 x 3.2 cm necrotic lesion in the upper left breast is evident.  9 mm nodule is seen in the anterior right thyroid lobe and there is a 5 mm nodule in the posterior left thyroid lobe. Borderline lymphadenopathy is identified in the prevascular space of the AP window. No subcarinal lymphadenopathy. No hilar lymphadenopathy.  The heart size is enlarged. Patient is status post median sternotomy. There is no pericardial or pleural effusion.  Lung windows demonstrate emphysema. No pulmonary parenchymal nodular mass. No focal areas of airspace consolidation. The  Bone windows reveal no worrisome lytic or sclerotic osseous lesions.  CT ABDOMEN AND PELVIS FINDINGS  Fatty infiltration of the liver parenchyma is evident. There is mild prominence of the intra hepatic bile ducts without gross dilatation of the extrahepatic common duct. These changes may be related to prior cholecystectomy. Spleen is normal. The stomach and duodenum are unremarkable. There is a 9 x 6 mm hypo attenuating lesion in the tail of the pancreas. The main pancreatic duct is slightly prominent.  No evidence for  adrenal nodule or mass. The left kidney is normal. 7 x 13 mm lesion in the right kidney is probably a cyst.  No abdominal aortic aneurysm. No free fluid or lymphadenopathy in the abdomen. There is a small 4 x 8 mm nodule in the right omentum.  Imaging through the pelvis shows no free intraperitoneal fluid. No pelvic sidewall lymphadenopathy. Fibroid changes noted in the uterus. Bladder is unremarkable. No adnexal mass. The terminal ileum is normal. The appendix is normal.  Bone windows reveal no worrisome lytic or sclerotic osseous lesions.  IMPRESSION: Large bilateral breast masses with bulky, sometimes necrotic lymphadenopathy in both subpectoral regions and both axillary regions, consistent with metastatic spread.  Tiny bilateral thyroid nodules. These may not be clinically relevant in this individual.  Mild prominence of the bile ducts and pancreatic duct. The biliary prominence may be secondary to the cholecystectomy. Main pancreatic duct prominence is of indeterminate etiology. There is a 9 x 6 mm hypo attenuating lesion in the tail the pancreas which could represent a tiny pseudocyst or cystic neoplasm.  8 mm right omental nodule is nonspecific. Attention to this area on follow-up imaging is recommended.   Electronically Signed   By: Misty Stanley M.D.   On: 08/22/2013 17:00   Mr Breast Bilateral W Wo Contrast  08/25/2013   CLINICAL DATA:  63 year old female with newly diagnosed cancers in both breasts with axillary lymphatic spread. Preoperative/treatment planning.  EXAM: BILATERAL BREAST MRI WITH AND WITHOUT CONTRAST  LABS:  None  TECHNIQUE: Multiplanar, multisequence MR images of both breasts were obtained prior to and following the intravenous administration of 30m of MultiHance.  THREE-DIMENSIONAL MR IMAGE RENDERING ON INDEPENDENT WORKSTATION:  Three-dimensional MR images were rendered by post-processing of the original MR data on an independent workstation. The three-dimensional MR images were  interpreted, and findings are reported in the following complete MRI report for this study. Three dimensional images were evaluated at the independent DynaCad workstation  COMPARISON:  08/07/2013 and prior studies  FINDINGS: Breast composition: b.  Scattered fibroglandular tissue.  Background parenchymal enhancement: Moderate  Right breast: A 4 x 4 x 5 cm (AP X transverse X CC) irregular enhancing mass with rapid washout kinetics is identified within  the posterior central and upper-outer right breast, containing biopsy clip artifact - compatible with biopsy proven neoplasm. Non masslike enhancement and small scattered enhancing masses within the central and most of the upper right breast are identified, with the entire area of abnormal enhancement encompassing a region measuring 8 x 9 x 7 cm (AP X transverse X CC).  Left breast: A 4.5 x 4 x 4.5 cm (AP X transverse X CC) irregular enhancing mass with rapid washout kinetics is identified within the upper/ upper-outer left breast, containing biopsy clip artifact compatible with biopsy proven neoplasm. Non masslike enhancement and small scattered enhancing masses within the majority of the upper left breast are identified, with the entire area of abnormal enhancement encompassing a region measuring 8 x 5 x 5.5 cm (AP X transverse X CC).  Lymph nodes: Enlarged bilateral level 1, level 2 and level 3 axillary lymph nodes are compatible with lymphatic spread. A 1 cm left internal mammary lymph node is also identified.  Ancillary findings:  None.  IMPRESSION: Biopsy proven neoplasm within both breasts, with bilateral axillary (level 1, level 2 and level 3) and left internal mammary lymphatic spread. The entire area of abnormal enhancement/neoplasm measures 8 x 9 x 7 cm within the right breast and 8 x 5 x 5.5 cm within the left breast.  RECOMMENDATION: Treatment plan  BI-RADS CATEGORY  6: Known biopsy-proven malignancy - appropriate action should be taken.   Electronically  Signed   By: Hassan Rowan M.D.   On: 08/25/2013 11:12   Nm Pet Image Initial (pi) Skull Base To Thigh  08/22/2013   CLINICAL DATA:  Initial treatment strategy for breast carcinoma. Bilateral node positive breast carcinoma.  EXAM: NUCLEAR MEDICINE PET SKULL BASE TO THIGH  FASTING BLOOD GLUCOSE:  Value: 145m/dl  TECHNIQUE: 17.4 mCi F-18 FDG was injected intravenously. CT data was obtained and used for attenuation correction and anatomic localization only. (This was not acquired as a diagnostic CT examination.) Additional exam technical data entered on technologist worksheet.  COMPARISON:  CT CHEST W/CM dated 08/22/2013  FINDINGS: NECK  No hypermetabolic lymph nodes in the neck.  CHEST  Bilateral hypermetabolic breast masses. These are intensely hypermetabolic with the right breast mass with SUV max 14.4 and the right breast mass with SUV max 15.1.  There are bilateral hypermetabolic axillary lymph nodes extending to the subpectoral also positions. 23 mm right axial lymph node with SUV max 14.4 (image 63). There is suspicion for a mildly hypermetabolic left internal mammary lymph node measuring 6 mm on image 64. Essentially hypermetabolic small prevascular lymph nodes anterior aorta are difficult to image. No suspicious pulmonary nodules. No paratracheal adenopathy.  ABDOMEN/PELVIS  No abnormal activity within the liver. No hypermetabolic abdominal pelvic lymph nodes.  There is a focus of intense hypermetabolic activity within the left aspect of the uterus in the lower uterine segment. There is an enhancing focus at this level measuring 29 x 26 mm (image 95, series 2) of the of dedicated CT scan of the same day. This intensely hypermetabolic with SUV max 194.4  SKELETON  There is a hypermetabolic focus within the posterior right iliac bone (image 170) with SUV max 6.2. There is no clear CT correlation this lesion.  IMPRESSION: 1. Bilateral hypermetabolic breast masses. 2. Bilateral hypermetabolic axillary nodal  metastasis extending to the subpectoral nodal station. . 3. Concern for left internal mammary nodal metastasis. 4. Focal metabolic activity in post right iliac bone is consistent with bone metastasis (this is CT  occult). 5. Hypermetabolic focus within the left aspect of lower uterine segment may represent a leiomyoma. Recommend pelvic ultrasound to exclude endometrial carcinoma.   Electronically Signed   By: Suzy Bouchard M.D.   On: 08/22/2013 17:30   Ir Fluoro Guide Cv Line Right  09/08/2013   CLINICAL DATA:  63 year old female with bilateral breast cancer. She requires durable central venous access for chemotherapy.  EXAM: IR RIGHT FLOURO GUIDE CV LINE; IR ULTRASOUND GUIDANCE VASC ACCESS RIGHT  Date: 09/08/2013  TECHNIQUE: The right neck and chest was prepped with chlorhexidine, and draped in the usual sterile fashion using maximum barrier technique (cap and mask, sterile gown, sterile gloves, large sterile sheet, hand hygiene and cutaneous antiseptic). Antibiotic prophylaxis was provided with twog Ancef administered IV one hour prior to skin incision. Local anesthesia was attained by infiltration with 1% lidocaine with epinephrine.  Ultrasound demonstrated patency of the right internal jugular vein, and this was documented with an image. Under real-time ultrasound guidance, this vein was accessed with a 21 gauge micropuncture needle and image documentation was performed. A small dermatotomy was made at the access site with an 11 scalpel. A 0.018" wire was advanced into the SVC and the access needle exchanged for a 16F micropuncture vascular sheath. The 0.018" wire was then removed and a 0.035" wire advanced into the IVC.  An appropriate location for the subcutaneous reservoir was selected below the clavicle and an incision was made through the skin and underlying soft tissues. The subcutaneous tissues were then dissected using a combination of blunt and sharp surgical technique and a pocket was formed. A single  lumen power injectable portacatheter was then tunneled through the subcutaneous tissues from the pocket to the dermatotomy and the port reservoir placed within the subcutaneous pocket.  The venous access site was then serially dilated and a peel away vascular sheath placed over the wire. The wire was removed and the port catheter advanced into position under fluoroscopic guidance. The catheter tip is positioned in the upper right atrium. This was documented with a spot image. The portacatheter was then tested and found to flush and aspirate well. The port was flushed with saline followed by 100 units/mL heparinized saline.  The pocket was then closed in two layers using first subdermal inverted interrupted absorbable sutures followed by a running subcuticular suture. The epidermis was then sealed with Dermabond. The dermatotomy at the venous access site was also closed with a single inverted subdermal suture and the epidermis sealed with Dermabond.  ANESTHESIA/SEDATION: Moderate (conscious) sedation was administered during this procedure. A total of 4 mg Versed and 150 mg Fentanyl were administered intravenously. The patient's vital signs were monitored continuously by radiology nursing throughout the course of the procedure.  Total sedation time: 25 minutes  FLUOROSCOPY TIME:  24 seconds  COMPLICATIONS: None.  The patient tolerated the procedure well.  IMPRESSION: Successful placement of a right IJ approach Power Port with ultrasound and fluoroscopic guidance. The catheter is ready for use.  Signed,  Criselda Peaches, MD  [facility]  [facility]   Electronically Signed   By: Jacqulynn Cadet M.D.   On: 09/08/2013 17:38   Ir US Guide Vasc Access Right  09/08/2013   CLINICAL DATA:  63 year old female with bilateral breast cancer. She requires durable central venous access for chemotherapy.  EXAM: IR RIGHT FLOURO GUIDE CV LINE; IR ULTRASOUND GUIDANCE VASC ACCESS  RIGHT  Date: 09/08/2013  TECHNIQUE: The right neck and chest was prepped  with chlorhexidine, and draped in the usual sterile fashion using maximum barrier technique (cap and mask, sterile gown, sterile gloves, large sterile sheet, hand hygiene and cutaneous antiseptic). Antibiotic prophylaxis was provided with twog Ancef administered IV one hour prior to skin incision. Local anesthesia was attained by infiltration with 1% lidocaine with epinephrine.  Ultrasound demonstrated patency of the right internal jugular vein, and this was documented with an image. Under real-time ultrasound guidance, this vein was accessed with a 21 gauge micropuncture needle and image documentation was performed. A small dermatotomy was made at the access site with an 11 scalpel. A 0.018" wire was advanced into the SVC and the access needle exchanged for a 55F micropuncture vascular sheath. The 0.018" wire was then removed and a 0.035" wire advanced into the IVC.  An appropriate location for the subcutaneous reservoir was selected below the clavicle and an incision was made through the skin and underlying soft tissues. The subcutaneous tissues were then dissected using a combination of blunt and sharp surgical technique and a pocket was formed. A single lumen power injectable portacatheter was then tunneled through the subcutaneous tissues from the pocket to the dermatotomy and the port reservoir placed within the subcutaneous pocket.  The venous access site was then serially dilated and a peel away vascular sheath placed over the wire. The wire was removed and the port catheter advanced into position under fluoroscopic guidance. The catheter tip is positioned in the upper right atrium. This was documented with a spot image. The portacatheter was then tested and found to flush and aspirate well. The port was flushed with saline followed by 100 units/mL heparinized saline.  The pocket was then closed in two layers using first subdermal inverted  interrupted absorbable sutures followed by a running subcuticular suture. The epidermis was then sealed with Dermabond. The dermatotomy at the venous access site was also closed with a single inverted subdermal suture and the epidermis sealed with Dermabond.  ANESTHESIA/SEDATION: Moderate (conscious) sedation was administered during this procedure. A total of 4 mg Versed and 150 mg Fentanyl were administered intravenously. The patient's vital signs were monitored continuously by radiology nursing throughout the course of the procedure.  Total sedation time: 25 minutes  FLUOROSCOPY TIME:  24 seconds  COMPLICATIONS: None.  The patient tolerated the procedure well.  IMPRESSION: Successful placement of a right IJ approach Power Port with ultrasound and fluoroscopic guidance. The catheter is ready for use.  Signed,  Criselda Peaches, MD  [facility]  [facility]   Electronically Signed   By: Jacqulynn Cadet M.D.   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 She was noted to have a lymph node that was positive for metastatic disease. 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 Patient originally was seen in the multidisciplinary breast clinic. She was recommended neoadjuvant treatment with chemotherapy consisting of FEC dose dense x6 cycles followed by Taxol weekly x12 weeks. Patient does desire breast conservation however it was discussed with her that that may or may not be feasible due to the extent of her disease.  #2 today patient will proceed with cycle 1 day 1 of FEC. She will return tomorrow for  day 2 Neulasta. Risks benefits  and rationale for chemotherapy was again explained to her thoroughly. She has had chemotherapy teaching class. All of her antiemetics were prescribed to her in advance. She is again counseled how to take them.  #3 pain control: Patient has been eating quite of bit of hydromorphone. We discussed pain control. I do think most of it is in her breasts. This could be secondary to the tumor itself cause an inflammation versus the biopsies. I have recommended to minimize use of hydromorphone and she certainly can take ibuprofen or Tylenol as needed. She was again cautioned about Tylenol usage and ibuprofen usage as well to liver and kidney side effects.  #4 tobacco abuse: We again discussed smoking cessation. Literature was given to her. She was also recommended smoking cessation classes. She states that she may be ready. I will continue to discuss this with her.  #5 cardiovascular: Patient does have history of atrial septal defect. She had an echocardiogram performed. We will continue to monitor her echo is periodically and if need arises we will refer her to cardiology.  #6 anxiety: Patient does have Ativan to take on an as-needed basis. Again she is cautioned about overuse adjuvant medications as well.  #7 followup: Patient will be seen by in one week for followup with me as well as blood work and toxicity monitoring.    All questions were answered. The patient knows to call the clinic with any problems, questions or concerns. We can certainly see the patient much sooner if necessary.  I spent 30 minutes counseling the patient face to face. The total time spent in the appointment was 40 minutes.    Marcy Panning, MD Medical/Oncology [facility] [phone] (beeper) [phone] (Office)  09/16/2013, 7:40 AM

## 2013-09-16 NOTE — Telephone Encounter (Signed)
Called patient to notify her to obtain tylenol or ibuprofen for pain as ordered by Dr. Humphrey Rolls.  She then asked "why provider changed her mind.  Ibuprofen was discontinued because it makes me bleed vaginally and my cancer is from my estrogen levels.  I'm still spotting now.  It is bright red and has an odor."   With further assessment learned she wears a pad and changes pads twice a week.  Explained to her what menopause is which she denies going a year without menses at age 63.  Denies having a PAP smear in the past 2 years.  Reports a "genital wart on the top of left arm had surgery on 1-2 years ago that when I mash, pus still comes out.  I have a dry mouth and my bowels aren't moving."    Expressed I will make a note to share with Dr. Lucile Shutters she needs to notify her PCP, Dr. Everette Rank about these symptoms.  Advised to continue fluids, try senna-S for bowels and hard candies or mouth lozenges for dry mouth.  Do not mash the wart and to change pads daily.  Reports she is to see Dr. Everette Rank 09-19-2013 but people say he is too old to be practicing medicine.  Advised her to begin searching for a new PCP if she agrees.  Continues to deny pain.  Suggested she journal pain with information (location, duration, pain scale, what she did and did it help) to help determine effectiveness of tylenol.  Fifteen minute call time.  No further questions or complaints.

## 2013-09-16 NOTE — Telephone Encounter (Signed)
Message copied by Cherylynn Ridges on Tue Sep 16, 2013  3:57 PM ------      Message from: Prentiss Bells      Created: Mon Sep 15, 2013 12:26 PM      Regarding: 1st time chemo      Contact: [phone]       FEC ------

## 2013-09-17 ENCOUNTER — Ambulatory Visit (HOSPITAL_BASED_OUTPATIENT_CLINIC_OR_DEPARTMENT_OTHER): Payer: Medicare Other

## 2013-09-17 ENCOUNTER — Telehealth: Payer: Self-pay | Admitting: *Deleted

## 2013-09-17 VITALS — BP 133/65 | HR 75 | Temp 97.5°F

## 2013-09-17 DIAGNOSIS — C50912 Malignant neoplasm of unspecified site of left female breast: Principal | ICD-10-CM

## 2013-09-17 DIAGNOSIS — C50919 Malignant neoplasm of unspecified site of unspecified female breast: Secondary | ICD-10-CM

## 2013-09-17 DIAGNOSIS — Z5189 Encounter for other specified aftercare: Secondary | ICD-10-CM

## 2013-09-17 DIAGNOSIS — C50419 Malignant neoplasm of upper-outer quadrant of unspecified female breast: Secondary | ICD-10-CM

## 2013-09-17 DIAGNOSIS — C773 Secondary and unspecified malignant neoplasm of axilla and upper limb lymph nodes: Secondary | ICD-10-CM

## 2013-09-17 DIAGNOSIS — C50911 Malignant neoplasm of unspecified site of right female breast: Secondary | ICD-10-CM

## 2013-09-17 MED ORDER — PEGFILGRASTIM INJECTION 6 MG/0.6ML
6.0000 mg | Freq: Once | SUBCUTANEOUS | Status: AC
  Administered 2013-09-17: 6 mg via SUBCUTANEOUS
  Filled 2013-09-17: qty 0.6

## 2013-09-17 NOTE — Patient Instructions (Signed)

## 2013-09-17 NOTE — Telephone Encounter (Signed)
Paula Navarro here for Neulasta injection following 1st chemo treatment.  Is a little tired because of not sleeping much last night.  Discussed the use of Lorezepam for help with resting.  She isn't having any nausea or vomiting.  All questions answered.  She did ask about a prescription for Tramadol.  She has been told that Dr Humphrey Rolls isn't giving her any more pain meds and that she can use Tylenol or Ibuprofen as needed.  Patient states that she understands this and that she isn't having much pain.  Knows to call if she has any problems.

## 2013-09-18 ENCOUNTER — Telehealth: Payer: Self-pay | Admitting: *Deleted

## 2013-09-18 ENCOUNTER — Encounter: Payer: Self-pay | Admitting: *Deleted

## 2013-09-18 NOTE — Telephone Encounter (Signed)
Received call from patient that her injection site from yesterday hurts and she is bleeding. I spoke with patient and told her to obtain Tylenol or Ibuprofen for pain. Patient stated, "I'm not taking Tylenol or Ibuprofen because it makes me bleed. Haven't you folks figured out why I'm bleeding? I'm not a drug addict but I need something stronger to help with my breast pain. I told patient to keep her appointment with her PCP Dr. Everette Rank for 09/19/13. Patient stated, " He's only good for the sniffles and a runny nose, what does he know about bleeding? I stated, "he can make a referral to an OB/GYN physician if you need one." Patient stated, " I have an OB/GYN doctor. I have PAP smears all the time."  Patient stated, "I haven't had a bowel movement since my port -a-cath was placed." I informed patient that Roz, triage nurse, told her on 2/17 to buy Senna-S and its over the counter and to increase her fluid intake. Patient stated, "I drink a lot of Sprite during the day."  Patient stated, "I'm not a drug addict, but you go tell Dr. Humphrey Rolls that I may not be coming back. I'm a sick person and I'm tired of all of this. Thank you for calling me back and taking your time with me. Good-bye." This was a twenty minute call time.

## 2013-09-18 NOTE — Progress Notes (Signed)
[place] Psychosocial Distress Screening Clinical Social Work  Clinical Social Work was referred by distress screening protocol.  The patient scored a 5 on the Psychosocial Distress Thermometer which indicates moderate distress. Clinical Social Worker met with patient in infusion room to assess for distress and other psychosocial needs.  Patient receiving first chemotherapy treatment.  Ms. Enerson was accompanied by her son.  She reports no needs at this time, but stated she requested bedside commode through Dr. Laurelyn Sickle office.  CSW briefly explained CSW role at [facility].   Clinical Social Worker follow up needed: no  If yes, follow up plan:   Polo Riley, MSW, LCSW, OSW-C Clinical Social Worker [facility] [phone]

## 2013-09-22 ENCOUNTER — Other Ambulatory Visit (HOSPITAL_BASED_OUTPATIENT_CLINIC_OR_DEPARTMENT_OTHER): Payer: Medicare Other

## 2013-09-22 ENCOUNTER — Encounter: Payer: Self-pay | Admitting: Oncology

## 2013-09-22 ENCOUNTER — Telehealth: Payer: Self-pay | Admitting: *Deleted

## 2013-09-22 ENCOUNTER — Ambulatory Visit (HOSPITAL_BASED_OUTPATIENT_CLINIC_OR_DEPARTMENT_OTHER): Payer: Medicare Other | Admitting: Oncology

## 2013-09-22 ENCOUNTER — Telehealth: Payer: Self-pay | Admitting: Oncology

## 2013-09-22 VITALS — BP 119/71 | HR 63 | Temp 98.3°F | Resp 18 | Ht 60.0 in | Wt 127.6 lb

## 2013-09-22 DIAGNOSIS — C50419 Malignant neoplasm of upper-outer quadrant of unspecified female breast: Secondary | ICD-10-CM

## 2013-09-22 DIAGNOSIS — C50912 Malignant neoplasm of unspecified site of left female breast: Principal | ICD-10-CM

## 2013-09-22 DIAGNOSIS — F17200 Nicotine dependence, unspecified, uncomplicated: Secondary | ICD-10-CM

## 2013-09-22 DIAGNOSIS — C50911 Malignant neoplasm of unspecified site of right female breast: Secondary | ICD-10-CM

## 2013-09-22 DIAGNOSIS — Z170 Estrogen receptor positive status [ER+]: Secondary | ICD-10-CM

## 2013-09-22 DIAGNOSIS — Z720 Tobacco use: Secondary | ICD-10-CM

## 2013-09-22 DIAGNOSIS — C773 Secondary and unspecified malignant neoplasm of axilla and upper limb lymph nodes: Secondary | ICD-10-CM

## 2013-09-22 DIAGNOSIS — F064 Anxiety disorder due to known physiological condition: Secondary | ICD-10-CM

## 2013-09-22 LAB — CBC WITH DIFFERENTIAL/PLATELET
BASO%: 0.4 % (ref 0.0–2.0)
BASOS ABS: 0 10*3/uL (ref 0.0–0.1)
EOS ABS: 0.1 10*3/uL (ref 0.0–0.5)
EOS%: 2.2 % (ref 0.0–7.0)
HCT: 39.4 % (ref 34.8–46.6)
HEMOGLOBIN: 12.3 g/dL (ref 11.6–15.9)
LYMPH#: 1.9 10*3/uL (ref 0.9–3.3)
LYMPH%: 31.4 % (ref 14.0–49.7)
MCH: 22.3 pg — ABNORMAL LOW (ref 25.1–34.0)
MCHC: 31.2 g/dL — ABNORMAL LOW (ref 31.5–36.0)
MCV: 71.6 fL — ABNORMAL LOW (ref 79.5–101.0)
MONO#: 0.2 10*3/uL (ref 0.1–0.9)
MONO%: 2.7 % (ref 0.0–14.0)
NEUT#: 3.7 10*3/uL (ref 1.5–6.5)
NEUT%: 63.3 % (ref 38.4–76.8)
Platelets: 90 10*3/uL — ABNORMAL LOW (ref 145–400)
RBC: 5.51 10*6/uL — ABNORMAL HIGH (ref 3.70–5.45)
RDW: 14 % (ref 11.2–14.5)
WBC: 5.9 10*3/uL (ref 3.9–10.3)

## 2013-09-22 LAB — COMPREHENSIVE METABOLIC PANEL (CC13)
ALK PHOS: 101 U/L (ref 40–150)
ALT: 10 U/L (ref 0–55)
ANION GAP: 8 meq/L (ref 3–11)
AST: 13 U/L (ref 5–34)
Albumin: 3.4 g/dL — ABNORMAL LOW (ref 3.5–5.0)
BUN: 13.1 mg/dL (ref 7.0–26.0)
CO2: 23 meq/L (ref 22–29)
Calcium: 9.1 mg/dL (ref 8.4–10.4)
Chloride: 107 mEq/L (ref 98–109)
Creatinine: 0.8 mg/dL (ref 0.6–1.1)
GLUCOSE: 129 mg/dL (ref 70–140)
Potassium: 3.9 mEq/L (ref 3.5–5.1)
Sodium: 138 mEq/L (ref 136–145)
Total Bilirubin: 0.79 mg/dL (ref 0.20–1.20)
Total Protein: 6.4 g/dL (ref 6.4–8.3)

## 2013-09-22 NOTE — Telephone Encounter (Signed)
I have adjusted 3/2 appt

## 2013-09-22 NOTE — Progress Notes (Signed)
OFFICE PROGRESS NOTE  CC  Paula Hampshire, MD [place] [address] Dr. Thea Silversmith  Dr. Autumn Messing  DIAGNOSIS:63 year old ER positive breast cancers. Patient is seen in the multidisciplinary breast clinic for discussion of treatment options   STAGE:  Bilateral breast cancer  Primary site: Breast (Bilateral)  Staging method: AJCC 7th Edition  Clinical: Stage IIB (T2, N1, cM0)  Summary: Stage IIB (T2, N1, cM0)     PRIOR THERAPY: 1. Patient states that she felt pain in her breasts and masses. Because of this she went on to have further workup performed including mammograms and ultrasound. On 07/23/2013 patient had bilateral diagnostic mammogram she was noted to have bilateral breast masses on the right irregular mass lying posterior lateral breast associated with a large rounded and dense axillary nodes. The left showed slightly smaller irregular mass lying at the 11:00 to 12:00 position. There was a smaller adjacent cyst. Also noted to have a large dense axillary lymph nodes on the left. Because of this she was recommended ultrasound and biopsies. 08/07/2013 patient underwent ultrasound-guided right breast core needle biopsy will. As well as left eye out see. 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 The lymph node was positive for metastatic disease. On the left patient had a biopsy that also showed invasive ductal carcinoma grade 3 ER positive PR negative HER-2/neu negative with a proliferation marker Ki-67 80%.Her clinical and radiologic staging was T2 NX on the left side T2 N1 on the right side.  2. Patient is seen at the multidisciplinary breast clinic. Neoadjuvant treatment was recommended with chemotherapy consisting of FEC followed by Taxol. Patient has had her Port-A-Cath placed she also had staging studies performed was also recorded. She has  had chemotherapy teaching class. All of her antiemetics were prescribed and she has become up from her pharmacy.  3. Patient was begun on neoadjuvant FEC dose dense starting to 2/16 /2015. Total of 6 cycles is planned. This will be followed by 12 weeks of Taxol  CURRENT THERAPY: Status post Neoadjuvant cycle 1 day 8 of dose dense FEC administered on 09/15/2013.  INTERVAL HISTORY: Paula Navarro 63 y.o. female returns for followup visit. Clinically she seems to be doing well. She tells me that the breast pain has significantly improved. She has been taking Tylenol and ibuprofen. Patient's platelets are low and I have recommended she discontinue taking ibuprofen. I have not given her a prescription for narcotics including dilaudid. She is okay with that now. We also discussed over use of Ativan. I recommended that she not use Ativan at all. She does have some insomnia. We discussed other ways of helping with getting her to bed including exercise relaxation and drinking non-caffeinated drinks and not watching television. She otherwise denies any nausea vomiting fevers chills no night sweats no abdominal pain no peripheral paresthesias. Patient unfortunately continues to smoke. I have discussed smoking cessation with her at several locations and material has been given to her. Patient tells me that she has stopped smoking since the last visit with me a week ago. 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 Remainder of the 10 point review of systems is negative.Marland Kitchen  MEDICAL HISTORY: Past Medical History  Diagnosis Date  . Chronic bronchitis   . Palpitation     Tachycardia reported by monitor clerk during a symptomatic spell  . Chest pain     Admitted to [facility]  in 09/2011; refused stress test  . Atrial septal defect 1996    Surgical repair in 1996  . Tobacco abuse     60 pack years; 1.5 packs per day  . Anxiety   . Anemia     ALLERGIES:  is allergic to codeine.  MEDICATIONS:  Current Outpatient Prescriptions   Medication Sig Dispense Refill  . dexamethasone (DECADRON) 4 MG tablet Take 4 mg by mouth 2 (two) times daily with a meal.      . lidocaine-prilocaine (EMLA) cream Apply 1 application topically as needed.      Marland Kitchen LORazepam (ATIVAN) 1 MG tablet Take 1 tablet (1 mg total) by mouth every 6 (six) hours as needed for anxiety.  30 tablet  1  . prochlorperazine (COMPAZINE) 10 MG tablet Take 1 tablet (10 mg total) by mouth every 6 (six) hours as needed (Nausea or vomiting).  30 tablet  1   No current facility-administered medications for this visit.    SURGICAL HISTORY:  Past Surgical History  Procedure Laterality Date  . Cholecystectomy    . Cesarean section    . Vein bypass surgery    . Breast biopsy Bilateral     REVIEW OF SYSTEMS:  Pertinent items are noted in HPI.     PHYSICAL EXAMINATION: Blood pressure 119/71, pulse 63, temperature 98.3 F (36.8 C), temperature source Oral, resp. rate 18, height 5' (1.524 m), weight 127 lb 9 oz (57.862 kg), last menstrual period 05/20/2011. Body mass index is 24.91 kg/(m^2). ECOG PERFORMANCE STATUS: 1 - Symptomatic but completely ambulatory  Well-developed nourished female in no acute distress HEENT exam: EOMI PERRLA sclerae anicteric no conjunctival pallor oral mucosa is moist adequate oral dental hygiene no posterior pharyngeal wall erythema Lymph nodes: Lymph node survey in the cervical supraclavicular node was negative patient is found to have palpable lymph node in the axilla Lungs: Clear to auscultation and percussion Cardiovascular: Regular rate rhythm Abdomen: Soft nontender nondistended bowel sounds are present no hepatosplenomegaly Extremities: No clubbing edema or cyanosis Neuro: Patient's alert oriented otherwise nonfocal Breasts: unchanged from previous exams.    LABORATORY DATA: Lab Results  Component Value Date   WBC 5.9 09/22/2013   HGB 12.3 09/22/2013   HCT 39.4 09/22/2013   MCV 71.6* 09/22/2013   PLT 90* 09/22/2013       Chemistry      Component Value Date/Time   NA 141 09/15/2013 1038   NA 141 09/23/2012 0414   K 3.8 09/15/2013 1038   K 3.8 09/23/2012 0414   CL 107 09/23/2012 0414   CO2 23 09/15/2013 1038   CO2 29 10/30/2011 1419   BUN 6.7* 09/15/2013 1038   BUN 7 09/23/2012 0414   CREATININE 0.8 09/15/2013 1038   CREATININE 0.90 09/23/2012 0414      Component Value Date/Time   CALCIUM 9.6 09/15/2013 1038   CALCIUM 10.1 10/30/2011 1419   ALKPHOS 78 09/15/2013 1038   AST 19 09/15/2013 1038   ALT 13 09/15/2013 1038   BILITOT 0.39 09/15/2013 1038       RADIOGRAPHIC STUDIES:  Ct Chest W Contrast  08/22/2013   CLINICAL DATA:  Breast cancer  EXAM: CT CHEST, ABDOMEN, AND PELVIS WITH CONTRAST  TECHNIQUE: Multidetector CT imaging of the chest, abdomen and pelvis was performed following the standard protocol during bolus administration of intravenous contrast.  CONTRAST:  173m OMNIPAQUE IOHEXOL 300 MG/ML  SOLN  COMPARISON:  None.  FINDINGS: CT CHEST FINDINGS  Bulky right axillary lymphadenopathy is evident. Index  node in the right axilla measures 2.9 x 1.9 cm. There is associated bulky right subpectoral lymphadenopathy with a 1.8 x 2.9 cm lymph node identified. In the lower outer aspect of the right breast, a 3.8 x 3.4 cm rim enhancing lesion is identified.  There is lymphadenopathy in the left axilla as well. 1.8 x 1.2 cm left axillary lymph node is noted. There is a necrotic 1.3 x 1.4 cm lymph node in the left subpectoral region. Other smaller left subpectoral lymph nodes are associated. 3.0 x 3.2 cm necrotic lesion in the upper left breast is evident.  9 mm nodule is seen in the anterior right thyroid lobe and there is a 5 mm nodule in the posterior left thyroid lobe. Borderline lymphadenopathy is identified in the prevascular space of the AP window. No subcarinal lymphadenopathy. No hilar lymphadenopathy.  The heart size is enlarged. Patient is status post median sternotomy. There is no pericardial or pleural effusion.  Lung  windows demonstrate emphysema. No pulmonary parenchymal nodular mass. No focal areas of airspace consolidation. The  Bone windows reveal no worrisome lytic or sclerotic osseous lesions.  CT ABDOMEN AND PELVIS FINDINGS  Fatty infiltration of the liver parenchyma is evident. There is mild prominence of the intra hepatic bile ducts without gross dilatation of the extrahepatic common duct. These changes may be related to prior cholecystectomy. Spleen is normal. The stomach and duodenum are unremarkable. There is a 9 x 6 mm hypo attenuating lesion in the tail of the pancreas. The main pancreatic duct is slightly prominent.  No evidence for adrenal nodule or mass. The left kidney is normal. 7 x 13 mm lesion in the right kidney is probably a cyst.  No abdominal aortic aneurysm. No free fluid or lymphadenopathy in the abdomen. There is a small 4 x 8 mm nodule in the right omentum.  Imaging through the pelvis shows no free intraperitoneal fluid. No pelvic sidewall lymphadenopathy. Fibroid changes noted in the uterus. Bladder is unremarkable. No adnexal mass. The terminal ileum is normal. The appendix is normal.  Bone windows reveal no worrisome lytic or sclerotic osseous lesions.  IMPRESSION: Large bilateral breast masses with bulky, sometimes necrotic lymphadenopathy in both subpectoral regions and both axillary regions, consistent with metastatic spread.  Tiny bilateral thyroid nodules. These may not be clinically relevant in this individual.  Mild prominence of the bile ducts and pancreatic duct. The biliary prominence may be secondary to the cholecystectomy. Main pancreatic duct prominence is of indeterminate etiology. There is a 9 x 6 mm hypo attenuating lesion in the tail the pancreas which could represent a tiny pseudocyst or cystic neoplasm.  8 mm right omental nodule is nonspecific. Attention to this area on follow-up imaging is recommended.   Electronically Signed   By: Misty Stanley M.D.   On: 08/22/2013 17:00    Ct Abdomen Pelvis W Contrast  08/22/2013   CLINICAL DATA:  Breast cancer  EXAM: CT CHEST, ABDOMEN, AND PELVIS WITH CONTRAST  TECHNIQUE: Multidetector CT imaging of the chest, abdomen and pelvis was performed following the standard protocol during bolus administration of intravenous contrast.  CONTRAST:  135m OMNIPAQUE IOHEXOL 300 MG/ML  SOLN  COMPARISON:  None.  FINDINGS: CT CHEST FINDINGS  Bulky right axillary lymphadenopathy is evident. Index node in the right axilla measures 2.9 x 1.9 cm. There is associated bulky right subpectoral lymphadenopathy with a 1.8 x 2.9 cm lymph node identified. In the lower outer aspect of the right breast, a 3.8 x 3.4 cm  rim enhancing lesion is identified.  There is lymphadenopathy in the left axilla as well. 1.8 x 1.2 cm left axillary lymph node is noted. There is a necrotic 1.3 x 1.4 cm lymph node in the left subpectoral region. Other smaller left subpectoral lymph nodes are associated. 3.0 x 3.2 cm necrotic lesion in the upper left breast is evident.  9 mm nodule is seen in the anterior right thyroid lobe and there is a 5 mm nodule in the posterior left thyroid lobe. Borderline lymphadenopathy is identified in the prevascular space of the AP window. No subcarinal lymphadenopathy. No hilar lymphadenopathy.  The heart size is enlarged. Patient is status post median sternotomy. There is no pericardial or pleural effusion.  Lung windows demonstrate emphysema. No pulmonary parenchymal nodular mass. No focal areas of airspace consolidation. The  Bone windows reveal no worrisome lytic or sclerotic osseous lesions.  CT ABDOMEN AND PELVIS FINDINGS  Fatty infiltration of the liver parenchyma is evident. There is mild prominence of the intra hepatic bile ducts without gross dilatation of the extrahepatic common duct. These changes may be related to prior cholecystectomy. Spleen is normal. The stomach and duodenum are unremarkable. There is a 9 x 6 mm hypo attenuating lesion in the tail  of the pancreas. The main pancreatic duct is slightly prominent.  No evidence for adrenal nodule or mass. The left kidney is normal. 7 x 13 mm lesion in the right kidney is probably a cyst.  No abdominal aortic aneurysm. No free fluid or lymphadenopathy in the abdomen. There is a small 4 x 8 mm nodule in the right omentum.  Imaging through the pelvis shows no free intraperitoneal fluid. No pelvic sidewall lymphadenopathy. Fibroid changes noted in the uterus. Bladder is unremarkable. No adnexal mass. The terminal ileum is normal. The appendix is normal.  Bone windows reveal no worrisome lytic or sclerotic osseous lesions.  IMPRESSION: Large bilateral breast masses with bulky, sometimes necrotic lymphadenopathy in both subpectoral regions and both axillary regions, consistent with metastatic spread.  Tiny bilateral thyroid nodules. These may not be clinically relevant in this individual.  Mild prominence of the bile ducts and pancreatic duct. The biliary prominence may be secondary to the cholecystectomy. Main pancreatic duct prominence is of indeterminate etiology. There is a 9 x 6 mm hypo attenuating lesion in the tail the pancreas which could represent a tiny pseudocyst or cystic neoplasm.  8 mm right omental nodule is nonspecific. Attention to this area on follow-up imaging is recommended.   Electronically Signed   By: Misty Stanley M.D.   On: 08/22/2013 17:00   Mr Breast Bilateral W Wo Contrast  08/25/2013   CLINICAL DATA:  63 year old female with newly diagnosed cancers in both breasts with axillary lymphatic spread. Preoperative/treatment planning.  EXAM: BILATERAL BREAST MRI WITH AND WITHOUT CONTRAST  LABS:  None  TECHNIQUE: Multiplanar, multisequence MR images of both breasts were obtained prior to and following the intravenous administration of 86m of MultiHance.  THREE-DIMENSIONAL MR IMAGE RENDERING ON INDEPENDENT WORKSTATION:  Three-dimensional MR images were rendered by post-processing of the original  MR data on an independent workstation. The three-dimensional MR images were interpreted, and findings are reported in the following complete MRI report for this study. Three dimensional images were evaluated at the independent DynaCad workstation  COMPARISON:  08/07/2013 and prior studies  FINDINGS: Breast composition: b.  Scattered fibroglandular tissue.  Background parenchymal enhancement: Moderate  Right breast: A 4 x 4 x 5 cm (AP X transverse X CC)  irregular enhancing mass with rapid washout kinetics is identified within the posterior central and upper-outer right breast, containing biopsy clip artifact - compatible with biopsy proven neoplasm. Non masslike enhancement and small scattered enhancing masses within the central and most of the upper right breast are identified, with the entire area of abnormal enhancement encompassing a region measuring 8 x 9 x 7 cm (AP X transverse X CC).  Left breast: A 4.5 x 4 x 4.5 cm (AP X transverse X CC) irregular enhancing mass with rapid washout kinetics is identified within the upper/ upper-outer left breast, containing biopsy clip artifact compatible with biopsy proven neoplasm. Non masslike enhancement and small scattered enhancing masses within the majority of the upper left breast are identified, with the entire area of abnormal enhancement encompassing a region measuring 8 x 5 x 5.5 cm (AP X transverse X CC).  Lymph nodes: Enlarged bilateral level 1, level 2 and level 3 axillary lymph nodes are compatible with lymphatic spread. A 1 cm left internal mammary lymph node is also identified.  Ancillary findings:  None.  IMPRESSION: Biopsy proven neoplasm within both breasts, with bilateral axillary (level 1, level 2 and level 3) and left internal mammary lymphatic spread. The entire area of abnormal enhancement/neoplasm measures 8 x 9 x 7 cm within the right breast and 8 x 5 x 5.5 cm within the left breast.  RECOMMENDATION: Treatment plan  BI-RADS CATEGORY  6: Known  biopsy-proven malignancy - appropriate action should be taken.   Electronically Signed   By: Hassan Rowan M.D.   On: 08/25/2013 11:12   Nm Pet Image Initial (pi) Skull Base To Thigh  08/22/2013   CLINICAL DATA:  Initial treatment strategy for breast carcinoma. Bilateral node positive breast carcinoma.  EXAM: NUCLEAR MEDICINE PET SKULL BASE TO THIGH  FASTING BLOOD GLUCOSE:  Value: 156m/dl  TECHNIQUE: 17.4 mCi F-18 FDG was injected intravenously. CT data was obtained and used for attenuation correction and anatomic localization only. (This was not acquired as a diagnostic CT examination.) Additional exam technical data entered on technologist worksheet.  COMPARISON:  CT CHEST W/CM dated 08/22/2013  FINDINGS: NECK  No hypermetabolic lymph nodes in the neck.  CHEST  Bilateral hypermetabolic breast masses. These are intensely hypermetabolic with the right breast mass with SUV max 14.4 and the right breast mass with SUV max 15.1.  There are bilateral hypermetabolic axillary lymph nodes extending to the subpectoral also positions. 23 mm right axial lymph node with SUV max 14.4 (image 63). There is suspicion for a mildly hypermetabolic left internal mammary lymph node measuring 6 mm on image 64. Essentially hypermetabolic small prevascular lymph nodes anterior aorta are difficult to image. No suspicious pulmonary nodules. No paratracheal adenopathy.  ABDOMEN/PELVIS  No abnormal activity within the liver. No hypermetabolic abdominal pelvic lymph nodes.  There is a focus of intense hypermetabolic activity within the left aspect of the uterus in the lower uterine segment. There is an enhancing focus at this level measuring 29 x 26 mm (image 95, series 2) of the of dedicated CT scan of the same day. This intensely hypermetabolic with SUV max 107.1  SKELETON  There is a hypermetabolic focus within the posterior right iliac bone (image 170) with SUV max 6.2. There is no clear CT correlation this lesion.  IMPRESSION: 1. Bilateral  hypermetabolic breast masses. 2. Bilateral hypermetabolic axillary nodal metastasis extending to the subpectoral nodal station. . 3. Concern for left internal mammary nodal metastasis. 4. Focal metabolic activity in post right  iliac bone is consistent with bone metastasis (this is CT occult). 5. Hypermetabolic focus within the left aspect of lower uterine segment may represent a leiomyoma. Recommend pelvic ultrasound to exclude endometrial carcinoma.   Electronically Signed   By: Suzy Bouchard M.D.   On: 08/22/2013 17:30   Ir Fluoro Guide Cv Line Right  09/08/2013   CLINICAL DATA:  63 year old female with bilateral breast cancer. She requires durable central venous access for chemotherapy.  EXAM: IR RIGHT FLOURO GUIDE CV LINE; IR ULTRASOUND GUIDANCE VASC ACCESS RIGHT  Date: 09/08/2013  TECHNIQUE: The right neck and chest was prepped with chlorhexidine, and draped in the usual sterile fashion using maximum barrier technique (cap and mask, sterile gown, sterile gloves, large sterile sheet, hand hygiene and cutaneous antiseptic). Antibiotic prophylaxis was provided with twog Ancef administered IV one hour prior to skin incision. Local anesthesia was attained by infiltration with 1% lidocaine with epinephrine.  Ultrasound demonstrated patency of the right internal jugular vein, and this was documented with an image. Under real-time ultrasound guidance, this vein was accessed with a 21 gauge micropuncture needle and image documentation was performed. A small dermatotomy was made at the access site with an 11 scalpel. A 0.018" wire was advanced into the SVC and the access needle exchanged for a 61F micropuncture vascular sheath. The 0.018" wire was then removed and a 0.035" wire advanced into the IVC.  An appropriate location for the subcutaneous reservoir was selected below the clavicle and an incision was made through the skin and underlying soft tissues. The subcutaneous tissues were then dissected using a  combination of blunt and sharp surgical technique and a pocket was formed. A single lumen power injectable portacatheter was then tunneled through the subcutaneous tissues from the pocket to the dermatotomy and the port reservoir placed within the subcutaneous pocket.  The venous access site was then serially dilated and a peel away vascular sheath placed over the wire. The wire was removed and the port catheter advanced into position under fluoroscopic guidance. The catheter tip is positioned in the upper right atrium. This was documented with a spot image. The portacatheter was then tested and found to flush and aspirate well. The port was flushed with saline followed by 100 units/mL heparinized saline.  The pocket was then closed in two layers using first subdermal inverted interrupted absorbable sutures followed by a running subcuticular suture. The epidermis was then sealed with Dermabond. The dermatotomy at the venous access site was also closed with a single inverted subdermal suture and the epidermis sealed with Dermabond.  ANESTHESIA/SEDATION: Moderate (conscious) sedation was administered during this procedure. A total of 4 mg Versed and 150 mg Fentanyl were administered intravenously. The patient's vital signs were monitored continuously by radiology nursing throughout the course of the procedure.  Total sedation time: 25 minutes  FLUOROSCOPY TIME:  24 seconds  COMPLICATIONS: None.  The patient tolerated the procedure well.  IMPRESSION: Successful placement of a right IJ approach Power Port with ultrasound and fluoroscopic guidance. The catheter is ready for use.  Signed,  Criselda Peaches, MD  [facility]  [facility]   Electronically Signed   By: Jacqulynn Cadet M.D.   On: 09/08/2013 17:38   Ir US Guide Vasc Access Right  09/08/2013   CLINICAL DATA:  63 year old female with bilateral breast cancer. She requires durable central venous access for  chemotherapy.  EXAM: IR RIGHT FLOURO GUIDE CV LINE; IR ULTRASOUND GUIDANCE VASC ACCESS RIGHT  Date:  09/08/2013  TECHNIQUE: The right neck and chest was prepped with chlorhexidine, and draped in the usual sterile fashion using maximum barrier technique (cap and mask, sterile gown, sterile gloves, large sterile sheet, hand hygiene and cutaneous antiseptic). Antibiotic prophylaxis was provided with twog Ancef administered IV one hour prior to skin incision. Local anesthesia was attained by infiltration with 1% lidocaine with epinephrine.  Ultrasound demonstrated patency of the right internal jugular vein, and this was documented with an image. Under real-time ultrasound guidance, this vein was accessed with a 21 gauge micropuncture needle and image documentation was performed. A small dermatotomy was made at the access site with an 11 scalpel. A 0.018" wire was advanced into the SVC and the access needle exchanged for a 14F micropuncture vascular sheath. The 0.018" wire was then removed and a 0.035" wire advanced into the IVC.  An appropriate location for the subcutaneous reservoir was selected below the clavicle and an incision was made through the skin and underlying soft tissues. The subcutaneous tissues were then dissected using a combination of blunt and sharp surgical technique and a pocket was formed. A single lumen power injectable portacatheter was then tunneled through the subcutaneous tissues from the pocket to the dermatotomy and the port reservoir placed within the subcutaneous pocket.  The venous access site was then serially dilated and a peel away vascular sheath placed over the wire. The wire was removed and the port catheter advanced into position under fluoroscopic guidance. The catheter tip is positioned in the upper right atrium. This was documented with a spot image. The portacatheter was then tested and found to flush and aspirate well. The port was flushed with saline followed by 100 units/mL  heparinized saline.  The pocket was then closed in two layers using first subdermal inverted interrupted absorbable sutures followed by a running subcuticular suture. The epidermis was then sealed with Dermabond. The dermatotomy at the venous access site was also closed with a single inverted subdermal suture and the epidermis sealed with Dermabond.  ANESTHESIA/SEDATION: Moderate (conscious) sedation was administered during this procedure. A total of 4 mg Versed and 150 mg Fentanyl were administered intravenously. The patient's vital signs were monitored continuously by radiology nursing throughout the course of the procedure.  Total sedation time: 25 minutes  FLUOROSCOPY TIME:  24 seconds  COMPLICATIONS: None.  The patient tolerated the procedure well.  IMPRESSION: Successful placement of a right IJ approach Power Port with ultrasound and fluoroscopic guidance. The catheter is ready for use.  Signed,  Criselda Peaches, MD  [facility]  [facility]   Electronically Signed   By: Jacqulynn Cadet M.D.   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 She was noted to have a lymph node that was positive for metastatic disease. 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 Patient originally was seen in the multidisciplinary breast clinic. She was recommended neoadjuvant treatment with chemotherapy consisting of FEC dose dense x6 cycles followed by Taxol weekly x12 weeks. Patient does desire breast conservation however it was discussed with her that that may or may not be feasible due to the extent of her disease.  #2 status post cycle 1  day  8 of FEC. Overall patient tolerated the chemotherapy well. She did not experience much in nausea.  #3 pain control: Patient will take Tylenol as needed. We are not giving her any more narcotics. I have recommended she discontinue taking ibuprofen since her platelet count is low.  #4 tobacco abuse: Patient tells me she quit smoking as of last week!  #5 cardiovascular: Patient does have history of atrial septal defect. She had an echocardiogram performed. We will continue to monitor her echo is periodically and if need arises we will refer her to cardiology.  #6 anxiety: Patient is now going to be given any more prescriptions for Ativan. We discussed counseling. She is referred to Dr. Garen Lah  #7 thrombocytopenia: Secondary to chemotherapy. No evidence of bleeding. We will continue to monitor.  #8 followup: Patient will be seen by in one week for followup with me as well as blood work  cycle 2 day 1 of FEC.   All questions were answered. The patient knows to call the clinic with any problems, questions or concerns. We can certainly see the patient much sooner if necessary.  I spent 20 minutes counseling the patient face to face. The total time spent in the appointment was 30 minutes.    Marcy Panning, MD Medical/Oncology [facility] [phone] (beeper) [phone] (Office)  09/22/2013, 11:34 AM

## 2013-09-22 NOTE — Telephone Encounter (Signed)
m, °

## 2013-09-26 ENCOUNTER — Telehealth: Payer: Self-pay

## 2013-09-26 NOTE — Telephone Encounter (Signed)
Note received from Nurse on call - scanned to chart.

## 2013-09-27 ENCOUNTER — Other Ambulatory Visit: Payer: Self-pay | Admitting: Oncology

## 2013-09-29 ENCOUNTER — Encounter: Payer: Self-pay | Admitting: Oncology

## 2013-09-29 ENCOUNTER — Ambulatory Visit: Payer: Medicare Other | Admitting: Nutrition

## 2013-09-29 ENCOUNTER — Other Ambulatory Visit: Payer: Medicare Other

## 2013-09-29 ENCOUNTER — Ambulatory Visit (HOSPITAL_BASED_OUTPATIENT_CLINIC_OR_DEPARTMENT_OTHER): Payer: Medicare Other

## 2013-09-29 ENCOUNTER — Ambulatory Visit (HOSPITAL_BASED_OUTPATIENT_CLINIC_OR_DEPARTMENT_OTHER): Payer: Medicare Other | Admitting: Oncology

## 2013-09-29 ENCOUNTER — Other Ambulatory Visit (HOSPITAL_BASED_OUTPATIENT_CLINIC_OR_DEPARTMENT_OTHER): Payer: Medicare Other

## 2013-09-29 VITALS — BP 148/78 | HR 63 | Temp 97.8°F | Resp 18 | Ht 60.0 in | Wt 128.5 lb

## 2013-09-29 DIAGNOSIS — C50912 Malignant neoplasm of unspecified site of left female breast: Principal | ICD-10-CM

## 2013-09-29 DIAGNOSIS — Z5111 Encounter for antineoplastic chemotherapy: Secondary | ICD-10-CM

## 2013-09-29 DIAGNOSIS — C50919 Malignant neoplasm of unspecified site of unspecified female breast: Secondary | ICD-10-CM

## 2013-09-29 DIAGNOSIS — R52 Pain, unspecified: Secondary | ICD-10-CM

## 2013-09-29 DIAGNOSIS — C773 Secondary and unspecified malignant neoplasm of axilla and upper limb lymph nodes: Secondary | ICD-10-CM

## 2013-09-29 DIAGNOSIS — C50911 Malignant neoplasm of unspecified site of right female breast: Secondary | ICD-10-CM

## 2013-09-29 LAB — COMPREHENSIVE METABOLIC PANEL (CC13)
ALBUMIN: 3.6 g/dL (ref 3.5–5.0)
ALT: 24 U/L (ref 0–55)
AST: 26 U/L (ref 5–34)
Alkaline Phosphatase: 83 U/L (ref 40–150)
Anion Gap: 8 mEq/L (ref 3–11)
BUN: 9.6 mg/dL (ref 7.0–26.0)
CHLORIDE: 109 meq/L (ref 98–109)
CO2: 25 meq/L (ref 22–29)
Calcium: 9.2 mg/dL (ref 8.4–10.4)
Creatinine: 0.8 mg/dL (ref 0.6–1.1)
Glucose: 98 mg/dl (ref 70–140)
Potassium: 3.7 mEq/L (ref 3.5–5.1)
Sodium: 141 mEq/L (ref 136–145)
Total Bilirubin: 0.35 mg/dL (ref 0.20–1.20)
Total Protein: 6.6 g/dL (ref 6.4–8.3)

## 2013-09-29 LAB — CBC WITH DIFFERENTIAL/PLATELET
BASO%: 1.4 % (ref 0.0–2.0)
Basophils Absolute: 0.3 10*3/uL — ABNORMAL HIGH (ref 0.0–0.1)
EOS ABS: 0.1 10*3/uL (ref 0.0–0.5)
EOS%: 0.3 % (ref 0.0–7.0)
HEMATOCRIT: 37 % (ref 34.8–46.6)
HGB: 12 g/dL (ref 11.6–15.9)
LYMPH#: 3.3 10*3/uL (ref 0.9–3.3)
LYMPH%: 18.3 % (ref 14.0–49.7)
MCH: 22.5 pg — ABNORMAL LOW (ref 25.1–34.0)
MCHC: 32.4 g/dL (ref 31.5–36.0)
MCV: 69.3 fL — ABNORMAL LOW (ref 79.5–101.0)
MONO#: 1.2 10*3/uL — AB (ref 0.1–0.9)
MONO%: 6.6 % (ref 0.0–14.0)
NEUT%: 73.4 % (ref 38.4–76.8)
NEUTROS ABS: 13.3 10*3/uL — AB (ref 1.5–6.5)
NRBC: 0 % (ref 0–0)
Platelets: 178 10*3/uL (ref 145–400)
RBC: 5.34 10*6/uL (ref 3.70–5.45)
RDW: 13.9 % (ref 11.2–14.5)
WBC: 18.2 10*3/uL — AB (ref 3.9–10.3)

## 2013-09-29 MED ORDER — SODIUM CHLORIDE 0.9 % IJ SOLN
10.0000 mL | INTRAMUSCULAR | Status: DC | PRN
  Administered 2013-09-29: 10 mL via INTRAVENOUS
  Filled 2013-09-29: qty 10

## 2013-09-29 MED ORDER — SODIUM CHLORIDE 0.9 % IV SOLN
Freq: Once | INTRAVENOUS | Status: AC
  Administered 2013-09-29: 11:00:00 via INTRAVENOUS

## 2013-09-29 MED ORDER — SODIUM CHLORIDE 0.9 % IV SOLN
150.0000 mg | Freq: Once | INTRAVENOUS | Status: AC
  Administered 2013-09-29: 150 mg via INTRAVENOUS
  Filled 2013-09-29: qty 5

## 2013-09-29 MED ORDER — DEXAMETHASONE SODIUM PHOSPHATE 10 MG/ML IJ SOLN
INTRAMUSCULAR | Status: AC
  Filled 2013-09-29: qty 2

## 2013-09-29 MED ORDER — EPIRUBICIN HCL CHEMO IV INJECTION 200 MG/100ML
100.0000 mg/m2 | Freq: Once | INTRAVENOUS | Status: AC
  Administered 2013-09-29: 158 mg via INTRAVENOUS
  Filled 2013-09-29: qty 79

## 2013-09-29 MED ORDER — SODIUM CHLORIDE 0.9 % IV SOLN
500.0000 mg/m2 | Freq: Once | INTRAVENOUS | Status: AC
  Administered 2013-09-29: 800 mg via INTRAVENOUS
  Filled 2013-09-29: qty 40

## 2013-09-29 MED ORDER — HEPARIN SOD (PORK) LOCK FLUSH 100 UNIT/ML IV SOLN
500.0000 [IU] | Freq: Once | INTRAVENOUS | Status: AC
  Administered 2013-09-29: 500 [IU] via INTRAVENOUS
  Filled 2013-09-29: qty 5

## 2013-09-29 MED ORDER — DEXAMETHASONE SODIUM PHOSPHATE 20 MG/5ML IJ SOLN
INTRAMUSCULAR | Status: AC
Start: 2013-09-29 — End: 2013-09-29
  Filled 2013-09-29: qty 5

## 2013-09-29 MED ORDER — PALONOSETRON HCL INJECTION 0.25 MG/5ML
INTRAVENOUS | Status: AC
  Filled 2013-09-29: qty 5

## 2013-09-29 MED ORDER — FLUOROURACIL CHEMO INJECTION 2.5 GM/50ML
500.0000 mg/m2 | Freq: Once | INTRAVENOUS | Status: AC
  Administered 2013-09-29: 800 mg via INTRAVENOUS
  Filled 2013-09-29: qty 16

## 2013-09-29 MED ORDER — DEXAMETHASONE SODIUM PHOSPHATE 20 MG/5ML IJ SOLN
12.0000 mg | Freq: Once | INTRAMUSCULAR | Status: AC
  Administered 2013-09-29: 12 mg via INTRAVENOUS

## 2013-09-29 MED ORDER — PALONOSETRON HCL INJECTION 0.25 MG/5ML
0.2500 mg | Freq: Once | INTRAVENOUS | Status: AC
  Administered 2013-09-29: 0.25 mg via INTRAVENOUS

## 2013-09-29 NOTE — Patient Instructions (Addendum)
[facility] Discharge Instructions for Patients Receiving Chemotherapy  Today you received the following chemotherapy agents: Epirubicin, 5FU, Cytoxan  To help prevent nausea and vomiting after your treatment, we encourage you to take your nausea medication as prescribed.   If you develop nausea and vomiting that is not controlled by your nausea medication, call the clinic.   BELOW ARE SYMPTOMS THAT SHOULD BE REPORTED IMMEDIATELY:  *FEVER GREATER THAN 100.5 F  *CHILLS WITH OR WITHOUT FEVER  NAUSEA AND VOMITING THAT IS NOT CONTROLLED WITH YOUR NAUSEA MEDICATION  *UNUSUAL SHORTNESS OF BREATH  *UNUSUAL BRUISING OR BLEEDING  TENDERNESS IN MOUTH AND THROAT WITH OR WITHOUT PRESENCE OF ULCERS  *URINARY PROBLEMS  *BOWEL PROBLEMS  UNUSUAL RASH Items with * indicate a potential emergency and should be followed up as soon as possible.  Feel free to call the clinic you have any questions or concerns. The clinic phone number is [phone].

## 2013-09-29 NOTE — Progress Notes (Signed)
OFFICE PROGRESS NOTE  CC  Paula Hampshire, MD [place] [address] Dr. Thea Silversmith  Dr. Autumn Messing  DIAGNOSIS:63 year old ER positive breast cancers. Patient is seen in the multidisciplinary breast clinic for discussion of treatment options   STAGE:  Bilateral breast cancer  Primary site: Breast (Bilateral)  Staging method: AJCC 7th Edition  Clinical: Stage IIB (T2, N1, cM0)  Summary: Stage IIB (T2, N1, cM0)     PRIOR THERAPY: 1. Patient states that she felt pain in her breasts and masses. Because of this she went on to have further workup performed including mammograms and ultrasound. On 07/23/2013 patient had bilateral diagnostic mammogram she was noted to have bilateral breast masses on the right irregular mass lying posterior lateral breast associated with a large rounded and dense axillary nodes. The left showed slightly smaller irregular mass lying at the 11:00 to 12:00 position. There was a smaller adjacent cyst. Also noted to have a large dense axillary lymph nodes on the left. Because of this she was recommended ultrasound and biopsies. 08/07/2013 patient underwent ultrasound-guided right breast core needle biopsy will. As well as left eye out see. 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 The lymph node was positive for metastatic disease. On the left patient had a biopsy that also showed invasive ductal carcinoma grade 3 ER positive PR negative HER-2/neu negative with a proliferation marker Ki-67 80%.Her clinical and radiologic staging was T2 NX on the left side T2 N1 on the right side.  2. Patient is seen at the multidisciplinary breast clinic. Neoadjuvant treatment was recommended with chemotherapy consisting of FEC followed by Taxol. Patient has had her Port-A-Cath placed she also had staging studies performed was also recorded. She has  had chemotherapy teaching class. All of her antiemetics were prescribed and she has become up from her pharmacy.  3. Patient was begun on neoadjuvant FEC dose dense starting to 2/16 /2015. Total of 6 cycles is planned. This will be followed by 12 weeks of Taxol  CURRENT THERAPY: Neoadjuvant cycle 2 day 1 of dose dense FEC with day 2 neulasta on 09/30/13  INTERVAL HISTORY: Paula Navarro 63 y.o. female returns for followup visit. Clinically she seems to be doing well. She tells me that the breast pain has significantly improved.  I have not given her a prescription for narcotics including dilaudid. She is okay with that now. We also discussed over use of Ativan. I recommended that she not use Ativan at all. She does have some insomnia. We discussed other ways of helping with getting her to bed including exercise relaxation and drinking non-caffeinated drinks and not watching television. She otherwise denies any nausea vomiting fevers chills no night sweats no abdominal pain no peripheral paresthesias. 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 She will proceed with her scheduled chemotherapy today. Remainder of the 10 point review of systems is negative.Marland Kitchen  MEDICAL HISTORY: Past Medical History  Diagnosis Date  . Chronic bronchitis   . Palpitation     Tachycardia reported by monitor clerk during a symptomatic spell  . Chest pain     Admitted to [facility] in 09/2011; refused stress test  . Atrial septal defect 1996    Surgical repair in 1996  . Tobacco abuse     60 pack years; 1.5 packs per day  . Anxiety   . Anemia     ALLERGIES:  is allergic  to codeine.  MEDICATIONS:  Current Outpatient Prescriptions  Medication Sig Dispense Refill  . dexamethasone (DECADRON) 4 MG tablet Take 4 mg by mouth 2 (two) times daily with a meal.      . lidocaine-prilocaine (EMLA) cream Apply 1 application topically as needed.      Marland Kitchen LORazepam (ATIVAN) 1 MG tablet Take 1 tablet (1 mg total) by mouth every 6 (six) hours as  needed for anxiety.  30 tablet  1  . prochlorperazine (COMPAZINE) 10 MG tablet Take 1 tablet (10 mg total) by mouth every 6 (six) hours as needed (Nausea or vomiting).  30 tablet  1   No current facility-administered medications for this visit.    SURGICAL HISTORY:  Past Surgical History  Procedure Laterality Date  . Cholecystectomy    . Cesarean section    . Vein bypass surgery    . Breast biopsy Bilateral     REVIEW OF SYSTEMS:  Pertinent items are noted in HPI.     PHYSICAL EXAMINATION: Blood pressure 148/78, pulse 63, temperature 97.8 F (36.6 C), temperature source Oral, resp. rate 18, height 5' (1.524 m), weight 128 lb 8 oz (58.287 kg), last menstrual period 05/20/2011. Body mass index is 25.1 kg/(m^2). ECOG PERFORMANCE STATUS: 1 - Symptomatic but completely ambulatory  Well-developed nourished female in no acute distress HEENT exam: EOMI PERRLA sclerae anicteric no conjunctival pallor oral mucosa is moist adequate oral dental hygiene no posterior pharyngeal wall erythema Lymph nodes: Lymph node survey in the cervical supraclavicular node was negative patient is found to have palpable lymph node in the axilla Lungs: Clear to auscultation and percussion Cardiovascular: Regular rate rhythm Abdomen: Soft nontender nondistended bowel sounds are present no hepatosplenomegaly Extremities: No clubbing edema or cyanosis Neuro: Patient's alert oriented otherwise nonfocal Breasts: bilateral masses are smaller and less prominent    LABORATORY DATA: Lab Results  Component Value Date   WBC 18.2* 09/29/2013   HGB 12.0 09/29/2013   HCT 37.0 09/29/2013   MCV 69.3* 09/29/2013   PLT 178 09/29/2013      Chemistry      Component Value Date/Time   NA 138 09/22/2013 1103   NA 141 09/23/2012 0414   K 3.9 09/22/2013 1103   K 3.8 09/23/2012 0414   CL 107 09/23/2012 0414   CO2 23 09/22/2013 1103   CO2 29 10/30/2011 1419   BUN 13.1 09/22/2013 1103   BUN 7 09/23/2012 0414   CREATININE 0.8 09/22/2013  1103   CREATININE 0.90 09/23/2012 0414      Component Value Date/Time   CALCIUM 9.1 09/22/2013 1103   CALCIUM 10.1 10/30/2011 1419   ALKPHOS 101 09/22/2013 1103   AST 13 09/22/2013 1103   ALT 10 09/22/2013 1103   BILITOT 0.79 09/22/2013 1103       RADIOGRAPHIC STUDIES:  Ct Chest W Contrast  08/22/2013   CLINICAL DATA:  Breast cancer  EXAM: CT CHEST, ABDOMEN, AND PELVIS WITH CONTRAST  TECHNIQUE: Multidetector CT imaging of the chest, abdomen and pelvis was performed following the standard protocol during bolus administration of intravenous contrast.  CONTRAST:  164m OMNIPAQUE IOHEXOL 300 MG/ML  SOLN  COMPARISON:  None.  FINDINGS: CT CHEST FINDINGS  Bulky right axillary lymphadenopathy is evident. Index node in the right axilla measures 2.9 x 1.9 cm. There is associated bulky right subpectoral lymphadenopathy with a 1.8 x 2.9 cm lymph node identified. In the lower outer aspect of the right breast, a 3.8 x 3.4 cm rim enhancing lesion is identified.  There is lymphadenopathy in the left axilla as well. 1.8 x 1.2 cm left axillary lymph node is noted. There is a necrotic 1.3 x 1.4 cm lymph node in the left subpectoral region. Other smaller left subpectoral lymph nodes are associated. 3.0 x 3.2 cm necrotic lesion in the upper left breast is evident.  9 mm nodule is seen in the anterior right thyroid lobe and there is a 5 mm nodule in the posterior left thyroid lobe. Borderline lymphadenopathy is identified in the prevascular space of the AP window. No subcarinal lymphadenopathy. No hilar lymphadenopathy.  The heart size is enlarged. Patient is status post median sternotomy. There is no pericardial or pleural effusion.  Lung windows demonstrate emphysema. No pulmonary parenchymal nodular mass. No focal areas of airspace consolidation. The  Bone windows reveal no worrisome lytic or sclerotic osseous lesions.  CT ABDOMEN AND PELVIS FINDINGS  Fatty infiltration of the liver parenchyma is evident. There is mild  prominence of the intra hepatic bile ducts without gross dilatation of the extrahepatic common duct. These changes may be related to prior cholecystectomy. Spleen is normal. The stomach and duodenum are unremarkable. There is a 9 x 6 mm hypo attenuating lesion in the tail of the pancreas. The main pancreatic duct is slightly prominent.  No evidence for adrenal nodule or mass. The left kidney is normal. 7 x 13 mm lesion in the right kidney is probably a cyst.  No abdominal aortic aneurysm. No free fluid or lymphadenopathy in the abdomen. There is a small 4 x 8 mm nodule in the right omentum.  Imaging through the pelvis shows no free intraperitoneal fluid. No pelvic sidewall lymphadenopathy. Fibroid changes noted in the uterus. Bladder is unremarkable. No adnexal mass. The terminal ileum is normal. The appendix is normal.  Bone windows reveal no worrisome lytic or sclerotic osseous lesions.  IMPRESSION: Large bilateral breast masses with bulky, sometimes necrotic lymphadenopathy in both subpectoral regions and both axillary regions, consistent with metastatic spread.  Tiny bilateral thyroid nodules. These may not be clinically relevant in this individual.  Mild prominence of the bile ducts and pancreatic duct. The biliary prominence may be secondary to the cholecystectomy. Main pancreatic duct prominence is of indeterminate etiology. There is a 9 x 6 mm hypo attenuating lesion in the tail the pancreas which could represent a tiny pseudocyst or cystic neoplasm.  8 mm right omental nodule is nonspecific. Attention to this area on follow-up imaging is recommended.   Electronically Signed   By: Misty Stanley M.D.   On: 08/22/2013 17:00   Ct Abdomen Pelvis W Contrast  08/22/2013   CLINICAL DATA:  Breast cancer  EXAM: CT CHEST, ABDOMEN, AND PELVIS WITH CONTRAST  TECHNIQUE: Multidetector CT imaging of the chest, abdomen and pelvis was performed following the standard protocol during bolus administration of intravenous  contrast.  CONTRAST:  136m OMNIPAQUE IOHEXOL 300 MG/ML  SOLN  COMPARISON:  None.  FINDINGS: CT CHEST FINDINGS  Bulky right axillary lymphadenopathy is evident. Index node in the right axilla measures 2.9 x 1.9 cm. There is associated bulky right subpectoral lymphadenopathy with a 1.8 x 2.9 cm lymph node identified. In the lower outer aspect of the right breast, a 3.8 x 3.4 cm rim enhancing lesion is identified.  There is lymphadenopathy in the left axilla as well. 1.8 x 1.2 cm left axillary lymph node is noted. There is a necrotic 1.3 x 1.4 cm lymph node in the left subpectoral region. Other smaller left subpectoral lymph nodes  are associated. 3.0 x 3.2 cm necrotic lesion in the upper left breast is evident.  9 mm nodule is seen in the anterior right thyroid lobe and there is a 5 mm nodule in the posterior left thyroid lobe. Borderline lymphadenopathy is identified in the prevascular space of the AP window. No subcarinal lymphadenopathy. No hilar lymphadenopathy.  The heart size is enlarged. Patient is status post median sternotomy. There is no pericardial or pleural effusion.  Lung windows demonstrate emphysema. No pulmonary parenchymal nodular mass. No focal areas of airspace consolidation. The  Bone windows reveal no worrisome lytic or sclerotic osseous lesions.  CT ABDOMEN AND PELVIS FINDINGS  Fatty infiltration of the liver parenchyma is evident. There is mild prominence of the intra hepatic bile ducts without gross dilatation of the extrahepatic common duct. These changes may be related to prior cholecystectomy. Spleen is normal. The stomach and duodenum are unremarkable. There is a 9 x 6 mm hypo attenuating lesion in the tail of the pancreas. The main pancreatic duct is slightly prominent.  No evidence for adrenal nodule or mass. The left kidney is normal. 7 x 13 mm lesion in the right kidney is probably a cyst.  No abdominal aortic aneurysm. No free fluid or lymphadenopathy in the abdomen. There is a small  4 x 8 mm nodule in the right omentum.  Imaging through the pelvis shows no free intraperitoneal fluid. No pelvic sidewall lymphadenopathy. Fibroid changes noted in the uterus. Bladder is unremarkable. No adnexal mass. The terminal ileum is normal. The appendix is normal.  Bone windows reveal no worrisome lytic or sclerotic osseous lesions.  IMPRESSION: Large bilateral breast masses with bulky, sometimes necrotic lymphadenopathy in both subpectoral regions and both axillary regions, consistent with metastatic spread.  Tiny bilateral thyroid nodules. These may not be clinically relevant in this individual.  Mild prominence of the bile ducts and pancreatic duct. The biliary prominence may be secondary to the cholecystectomy. Main pancreatic duct prominence is of indeterminate etiology. There is a 9 x 6 mm hypo attenuating lesion in the tail the pancreas which could represent a tiny pseudocyst or cystic neoplasm.  8 mm right omental nodule is nonspecific. Attention to this area on follow-up imaging is recommended.   Electronically Signed   By: Misty Stanley M.D.   On: 08/22/2013 17:00   Mr Breast Bilateral W Wo Contrast  08/25/2013   CLINICAL DATA:  63 year old female with newly diagnosed cancers in both breasts with axillary lymphatic spread. Preoperative/treatment planning.  EXAM: BILATERAL BREAST MRI WITH AND WITHOUT CONTRAST  LABS:  None  TECHNIQUE: Multiplanar, multisequence MR images of both breasts were obtained prior to and following the intravenous administration of 59m of MultiHance.  THREE-DIMENSIONAL MR IMAGE RENDERING ON INDEPENDENT WORKSTATION:  Three-dimensional MR images were rendered by post-processing of the original MR data on an independent workstation. The three-dimensional MR images were interpreted, and findings are reported in the following complete MRI report for this study. Three dimensional images were evaluated at the independent DynaCad workstation  COMPARISON:  08/07/2013 and prior  studies  FINDINGS: Breast composition: b.  Scattered fibroglandular tissue.  Background parenchymal enhancement: Moderate  Right breast: A 4 x 4 x 5 cm (AP X transverse X CC) irregular enhancing mass with rapid washout kinetics is identified within the posterior central and upper-outer right breast, containing biopsy clip artifact - compatible with biopsy proven neoplasm. Non masslike enhancement and small scattered enhancing masses within the central and most of the upper right breast are  identified, with the entire area of abnormal enhancement encompassing a region measuring 8 x 9 x 7 cm (AP X transverse X CC).  Left breast: A 4.5 x 4 x 4.5 cm (AP X transverse X CC) irregular enhancing mass with rapid washout kinetics is identified within the upper/ upper-outer left breast, containing biopsy clip artifact compatible with biopsy proven neoplasm. Non masslike enhancement and small scattered enhancing masses within the majority of the upper left breast are identified, with the entire area of abnormal enhancement encompassing a region measuring 8 x 5 x 5.5 cm (AP X transverse X CC).  Lymph nodes: Enlarged bilateral level 1, level 2 and level 3 axillary lymph nodes are compatible with lymphatic spread. A 1 cm left internal mammary lymph node is also identified.  Ancillary findings:  None.  IMPRESSION: Biopsy proven neoplasm within both breasts, with bilateral axillary (level 1, level 2 and level 3) and left internal mammary lymphatic spread. The entire area of abnormal enhancement/neoplasm measures 8 x 9 x 7 cm within the right breast and 8 x 5 x 5.5 cm within the left breast.  RECOMMENDATION: Treatment plan  BI-RADS CATEGORY  6: Known biopsy-proven malignancy - appropriate action should be taken.   Electronically Signed   By: Hassan Rowan M.D.   On: 08/25/2013 11:12   Nm Pet Image Initial (pi) Skull Base To Thigh  08/22/2013   CLINICAL DATA:  Initial treatment strategy for breast carcinoma. Bilateral node positive  breast carcinoma.  EXAM: NUCLEAR MEDICINE PET SKULL BASE TO THIGH  FASTING BLOOD GLUCOSE:  Value: 172m/dl  TECHNIQUE: 17.4 mCi F-18 FDG was injected intravenously. CT data was obtained and used for attenuation correction and anatomic localization only. (This was not acquired as a diagnostic CT examination.) Additional exam technical data entered on technologist worksheet.  COMPARISON:  CT CHEST W/CM dated 08/22/2013  FINDINGS: NECK  No hypermetabolic lymph nodes in the neck.  CHEST  Bilateral hypermetabolic breast masses. These are intensely hypermetabolic with the right breast mass with SUV max 14.4 and the right breast mass with SUV max 15.1.  There are bilateral hypermetabolic axillary lymph nodes extending to the subpectoral also positions. 23 mm right axial lymph node with SUV max 14.4 (image 63). There is suspicion for a mildly hypermetabolic left internal mammary lymph node measuring 6 mm on image 64. Essentially hypermetabolic small prevascular lymph nodes anterior aorta are difficult to image. No suspicious pulmonary nodules. No paratracheal adenopathy.  ABDOMEN/PELVIS  No abnormal activity within the liver. No hypermetabolic abdominal pelvic lymph nodes.  There is a focus of intense hypermetabolic activity within the left aspect of the uterus in the lower uterine segment. There is an enhancing focus at this level measuring 29 x 26 mm (image 95, series 2) of the of dedicated CT scan of the same day. This intensely hypermetabolic with SUV max 188.9  SKELETON  There is a hypermetabolic focus within the posterior right iliac bone (image 170) with SUV max 6.2. There is no clear CT correlation this lesion.  IMPRESSION: 1. Bilateral hypermetabolic breast masses. 2. Bilateral hypermetabolic axillary nodal metastasis extending to the subpectoral nodal station. . 3. Concern for left internal mammary nodal metastasis. 4. Focal metabolic activity in post right iliac bone is consistent with bone metastasis (this is CT  occult). 5. Hypermetabolic focus within the left aspect of lower uterine segment may represent a leiomyoma. Recommend pelvic ultrasound to exclude endometrial carcinoma.   Electronically Signed   By: SHelane GuntherD.  On: 08/22/2013 17:30   Ir Fluoro Guide Cv Line Right  09/08/2013   CLINICAL DATA:  63 year old female with bilateral breast cancer. She requires durable central venous access for chemotherapy.  EXAM: IR RIGHT FLOURO GUIDE CV LINE; IR ULTRASOUND GUIDANCE VASC ACCESS RIGHT  Date: 09/08/2013  TECHNIQUE: The right neck and chest was prepped with chlorhexidine, and draped in the usual sterile fashion using maximum barrier technique (cap and mask, sterile gown, sterile gloves, large sterile sheet, hand hygiene and cutaneous antiseptic). Antibiotic prophylaxis was provided with twog Ancef administered IV one hour prior to skin incision. Local anesthesia was attained by infiltration with 1% lidocaine with epinephrine.  Ultrasound demonstrated patency of the right internal jugular vein, and this was documented with an image. Under real-time ultrasound guidance, this vein was accessed with a 21 gauge micropuncture needle and image documentation was performed. A small dermatotomy was made at the access site with an 11 scalpel. A 0.018" wire was advanced into the SVC and the access needle exchanged for a 80F micropuncture vascular sheath. The 0.018" wire was then removed and a 0.035" wire advanced into the IVC.  An appropriate location for the subcutaneous reservoir was selected below the clavicle and an incision was made through the skin and underlying soft tissues. The subcutaneous tissues were then dissected using a combination of blunt and sharp surgical technique and a pocket was formed. A single lumen power injectable portacatheter was then tunneled through the subcutaneous tissues from the pocket to the dermatotomy and the port reservoir placed within the subcutaneous pocket.  The venous access site  was then serially dilated and a peel away vascular sheath placed over the wire. The wire was removed and the port catheter advanced into position under fluoroscopic guidance. The catheter tip is positioned in the upper right atrium. This was documented with a spot image. The portacatheter was then tested and found to flush and aspirate well. The port was flushed with saline followed by 100 units/mL heparinized saline.  The pocket was then closed in two layers using first subdermal inverted interrupted absorbable sutures followed by a running subcuticular suture. The epidermis was then sealed with Dermabond. The dermatotomy at the venous access site was also closed with a single inverted subdermal suture and the epidermis sealed with Dermabond.  ANESTHESIA/SEDATION: Moderate (conscious) sedation was administered during this procedure. A total of 4 mg Versed and 150 mg Fentanyl were administered intravenously. The patient's vital signs were monitored continuously by radiology nursing throughout the course of the procedure.  Total sedation time: 25 minutes  FLUOROSCOPY TIME:  24 seconds  COMPLICATIONS: None.  The patient tolerated the procedure well.  IMPRESSION: Successful placement of a right IJ approach Power Port with ultrasound and fluoroscopic guidance. The catheter is ready for use.  Signed,  Criselda Peaches, MD  [facility]  [facility]   Electronically Signed   By: Jacqulynn Cadet M.D.   On: 09/08/2013 17:38   Ir US Guide Vasc Access Right  09/08/2013   CLINICAL DATA:  63 year old female with bilateral breast cancer. She requires durable central venous access for chemotherapy.  EXAM: IR RIGHT FLOURO GUIDE CV LINE; IR ULTRASOUND GUIDANCE VASC ACCESS RIGHT  Date: 09/08/2013  TECHNIQUE: The right neck and chest was prepped with chlorhexidine, and draped in the usual sterile fashion using maximum barrier technique (cap and mask, sterile gown, sterile gloves, large  sterile sheet, hand hygiene and cutaneous antiseptic). Antibiotic prophylaxis was provided with twog Ancef administered  IV one hour prior to skin incision. Local anesthesia was attained by infiltration with 1% lidocaine with epinephrine.  Ultrasound demonstrated patency of the right internal jugular vein, and this was documented with an image. Under real-time ultrasound guidance, this vein was accessed with a 21 gauge micropuncture needle and image documentation was performed. A small dermatotomy was made at the access site with an 11 scalpel. A 0.018" wire was advanced into the SVC and the access needle exchanged for a 58F micropuncture vascular sheath. The 0.018" wire was then removed and a 0.035" wire advanced into the IVC.  An appropriate location for the subcutaneous reservoir was selected below the clavicle and an incision was made through the skin and underlying soft tissues. The subcutaneous tissues were then dissected using a combination of blunt and sharp surgical technique and a pocket was formed. A single lumen power injectable portacatheter was then tunneled through the subcutaneous tissues from the pocket to the dermatotomy and the port reservoir placed within the subcutaneous pocket.  The venous access site was then serially dilated and a peel away vascular sheath placed over the wire. The wire was removed and the port catheter advanced into position under fluoroscopic guidance. The catheter tip is positioned in the upper right atrium. This was documented with a spot image. The portacatheter was then tested and found to flush and aspirate well. The port was flushed with saline followed by 100 units/mL heparinized saline.  The pocket was then closed in two layers using first subdermal inverted interrupted absorbable sutures followed by a running subcuticular suture. The epidermis was then sealed with Dermabond. The dermatotomy at the venous access site was also closed with a single inverted subdermal  suture and the epidermis sealed with Dermabond.  ANESTHESIA/SEDATION: Moderate (conscious) sedation was administered during this procedure. A total of 4 mg Versed and 150 mg Fentanyl were administered intravenously. The patient's vital signs were monitored continuously by radiology nursing throughout the course of the procedure.  Total sedation time: 25 minutes  FLUOROSCOPY TIME:  24 seconds  COMPLICATIONS: None.  The patient tolerated the procedure well.  IMPRESSION: Successful placement of a right IJ approach Power Port with ultrasound and fluoroscopic guidance. The catheter is ready for use.  Signed,  Criselda Peaches, MD  [facility] [facility]  [facility]   Electronically Signed   By: Jacqulynn Cadet M.D.   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 She was noted to have a lymph node that was positive for metastatic disease. 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 Patient originally was seen in the multidisciplinary breast clinic. She was recommended neoadjuvant treatment with chemotherapy consisting of FEC dose dense x6 cycles followed by Taxol weekly x12 weeks. Patient does desire breast conservation however it was discussed with her that that may or may not be feasible due to the extent of her disease.  #2 Here for  cycle 2 day 1 of FEC. Overall patient tolerated the chemotherapy well.   #3 pain control: Patient will take Tylenol as needed. We are not giving her any more narcotics. I have recommended she discontinue taking ibuprofen since her platelet count is low.  #4 tobacco abuse: Patient  has quit smoking!!!!   #5  cardiovascular: Patient does have history of atrial septal defect. She had an echocardiogram performed. We will continue to monitor her echo is periodically and if need arises we will refer her to cardiology.  #6 anxiety: improved patient does not want to see Dr. Ferdinand Lango  #7 thrombocytopenia: resolved  #8 followup: Patient will be seen by in one week for followup with me as well as blood work  And chemotoxicity   All questions were answered. The patient knows to call the clinic with any problems, questions or concerns. We can certainly see the patient much sooner if necessary.  I spent 20 minutes counseling the patient face to face. The total time spent in the appointment was 30 minutes.    Marcy Panning, MD Medical/Oncology [facility] [phone] (beeper) [phone] (Office)  09/29/2013, 10:10 AM

## 2013-09-29 NOTE — Progress Notes (Signed)
Patient is a 63 year old female diagnosed with ER positive Stage II bilateral breast cancer.  She is a patient of Dr. Humphrey Rolls.  Past medical history includes tobacco usage, anxiety, and anemia.  Medications include Decadron, Ativan, and Compazine.  Labs include albumin 3.4.  Height: 5 feet 0 inches. Weight: 128 pounds. Usual body weight: 137 pounds April 2013.  Patient weight reported at 131 pounds January 2015. BMI: 24.91.  Patient requesting information on what she can eat to stop her cancer from growing.  Nutrition diagnosis: Food and nutrition related knowledge deficit related to new diagnosis of breast cancer as evidenced by no prior need for nutrition related information.  Intervention: Patient educated to consume, low-fat, high protein, plant-based diet to promote weight maintenance.  Patient educated on foods that are high in protein and appropriate snacks for between meals.  Patient also educated on strategies for nausea prevention.  I gave patient fact sheets.  Questions were answered.  Teach back method used.  Monitoring, evaluation, goals: Patient will tolerate adequate calories and protein to promote weight maintenance throughout treatment.  Next visit: Monday, March 16, during chemotherapy.

## 2013-09-29 NOTE — Patient Instructions (Signed)
Proceed with chemotherapy today cycle 2  Return on 3/3 for neulasta injection.  Please begin taking claritin and Ibuprofen daily beginning on 3/3 prior to injection and continue for 5 days.

## 2013-09-30 ENCOUNTER — Ambulatory Visit (HOSPITAL_BASED_OUTPATIENT_CLINIC_OR_DEPARTMENT_OTHER): Payer: Medicare Other

## 2013-09-30 VITALS — BP 155/65 | HR 69 | Temp 98.9°F

## 2013-09-30 DIAGNOSIS — C50912 Malignant neoplasm of unspecified site of left female breast: Principal | ICD-10-CM

## 2013-09-30 DIAGNOSIS — C773 Secondary and unspecified malignant neoplasm of axilla and upper limb lymph nodes: Secondary | ICD-10-CM

## 2013-09-30 DIAGNOSIS — Z5189 Encounter for other specified aftercare: Secondary | ICD-10-CM

## 2013-09-30 DIAGNOSIS — C50911 Malignant neoplasm of unspecified site of right female breast: Secondary | ICD-10-CM

## 2013-09-30 DIAGNOSIS — C50919 Malignant neoplasm of unspecified site of unspecified female breast: Secondary | ICD-10-CM

## 2013-09-30 MED ORDER — PEGFILGRASTIM INJECTION 6 MG/0.6ML
6.0000 mg | Freq: Once | SUBCUTANEOUS | Status: AC
  Administered 2013-09-30: 6 mg via SUBCUTANEOUS
  Filled 2013-09-30: qty 0.6

## 2013-09-30 NOTE — Patient Instructions (Signed)

## 2013-10-06 ENCOUNTER — Other Ambulatory Visit (HOSPITAL_BASED_OUTPATIENT_CLINIC_OR_DEPARTMENT_OTHER): Payer: Medicare Other

## 2013-10-06 ENCOUNTER — Ambulatory Visit (HOSPITAL_BASED_OUTPATIENT_CLINIC_OR_DEPARTMENT_OTHER): Payer: Medicare Other | Admitting: Oncology

## 2013-10-06 ENCOUNTER — Encounter: Payer: Self-pay | Admitting: Oncology

## 2013-10-06 VITALS — BP 122/71 | HR 75 | Temp 98.3°F | Resp 18 | Ht 60.0 in | Wt 125.4 lb

## 2013-10-06 DIAGNOSIS — C50912 Malignant neoplasm of unspecified site of left female breast: Principal | ICD-10-CM

## 2013-10-06 DIAGNOSIS — C50919 Malignant neoplasm of unspecified site of unspecified female breast: Secondary | ICD-10-CM

## 2013-10-06 DIAGNOSIS — Z170 Estrogen receptor positive status [ER+]: Secondary | ICD-10-CM

## 2013-10-06 DIAGNOSIS — C50911 Malignant neoplasm of unspecified site of right female breast: Secondary | ICD-10-CM

## 2013-10-06 DIAGNOSIS — Z720 Tobacco use: Secondary | ICD-10-CM

## 2013-10-06 DIAGNOSIS — Z87891 Personal history of nicotine dependence: Secondary | ICD-10-CM

## 2013-10-06 LAB — CBC WITH DIFFERENTIAL/PLATELET
BASO%: 0.4 % (ref 0.0–2.0)
BASOS ABS: 0 10*3/uL (ref 0.0–0.1)
EOS%: 0.5 % (ref 0.0–7.0)
Eosinophils Absolute: 0 10*3/uL (ref 0.0–0.5)
HCT: 37.9 % (ref 34.8–46.6)
HEMOGLOBIN: 11.8 g/dL (ref 11.6–15.9)
LYMPH#: 2 10*3/uL (ref 0.9–3.3)
LYMPH%: 22.5 % (ref 14.0–49.7)
MCH: 21.9 pg — ABNORMAL LOW (ref 25.1–34.0)
MCHC: 31.2 g/dL — ABNORMAL LOW (ref 31.5–36.0)
MCV: 70.3 fL — ABNORMAL LOW (ref 79.5–101.0)
MONO#: 0.6 10*3/uL (ref 0.1–0.9)
MONO%: 6.4 % (ref 0.0–14.0)
NEUT#: 6.2 10*3/uL (ref 1.5–6.5)
NEUT%: 70.2 % (ref 38.4–76.8)
Platelets: 169 10*3/uL (ref 145–400)
RBC: 5.39 10*6/uL (ref 3.70–5.45)
RDW: 13.7 % (ref 11.2–14.5)
WBC: 8.8 10*3/uL (ref 3.9–10.3)

## 2013-10-06 LAB — COMPREHENSIVE METABOLIC PANEL (CC13)
ALBUMIN: 3.6 g/dL (ref 3.5–5.0)
ALT: 17 U/L (ref 0–55)
AST: 18 U/L (ref 5–34)
Alkaline Phosphatase: 147 U/L (ref 40–150)
Anion Gap: 10 mEq/L (ref 3–11)
BUN: 10.1 mg/dL (ref 7.0–26.0)
CALCIUM: 9.7 mg/dL (ref 8.4–10.4)
CHLORIDE: 104 meq/L (ref 98–109)
CO2: 27 mEq/L (ref 22–29)
Creatinine: 0.8 mg/dL (ref 0.6–1.1)
GLUCOSE: 98 mg/dL (ref 70–140)
POTASSIUM: 3.9 meq/L (ref 3.5–5.1)
Sodium: 140 mEq/L (ref 136–145)
Total Bilirubin: 0.71 mg/dL (ref 0.20–1.20)
Total Protein: 6.5 g/dL (ref 6.4–8.3)

## 2013-10-06 MED ORDER — ZOLPIDEM TARTRATE 5 MG PO TABS: 5.0000 mg | ORAL_TABLET | Freq: Every evening | ORAL | Status: DC | PRN

## 2013-10-06 NOTE — Progress Notes (Signed)
OFFICE PROGRESS NOTE  CC  Lanette Hampshire, MD [place] [address] Dr. Thea Silversmith  Dr. Autumn Messing  DIAGNOSIS:63 year old ER positive breast cancers. Patient is seen in the multidisciplinary breast clinic for discussion of treatment options   STAGE:  Bilateral breast cancer  Primary site: Breast (Bilateral)  Staging method: AJCC 7th Edition  Clinical: Stage IIB (T2, N1, cM0)  Summary: Stage IIB (T2, N1, cM0)     PRIOR THERAPY: 1. Patient states that she felt pain in her breasts and masses. Because of this she went on to have further workup performed including mammograms and ultrasound. On 07/23/2013 patient had bilateral diagnostic mammogram she was noted to have bilateral breast masses on the right irregular mass lying posterior lateral breast associated with a large rounded and dense axillary nodes. The left showed slightly smaller irregular mass lying at the 11:00 to 12:00 position. There was a smaller adjacent cyst. Also noted to have a large dense axillary lymph nodes on the left. Because of this she was recommended ultrasound and biopsies. 08/07/2013 patient underwent ultrasound-guided right breast core needle biopsy will. As well as left eye out see. 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 The lymph node was positive for metastatic disease. On the left patient had a biopsy that also showed invasive ductal carcinoma grade 3 ER positive PR negative HER-2/neu negative with a proliferation marker Ki-67 80%.Her clinical and radiologic staging was T2 NX on the left side T2 N1 on the right side.  2. Patient is seen at the multidisciplinary breast clinic. Neoadjuvant treatment was recommended with chemotherapy consisting of FEC followed by Taxol. Patient has had her Port-A-Cath placed she also had staging studies performed was also recorded. She has  had chemotherapy teaching class. All of her antiemetics were prescribed and she has become up from her pharmacy.  3. Patient was begun on neoadjuvant FEC dose dense starting to 2/16 /2015. Total of 6 cycles is planned. This will be followed by 12 weeks of Taxol  CURRENT THERAPY: Neoadjuvant cycle 2 day 8 of dose dense FEC with day 2 neulasta on 09/30/13  INTERVAL HISTORY: Paula Navarro 63 y.o. female returns for followup visit. She tolerated cycle 2 of her treatment very well. She is a little bit fatigued but she has no nausea or vomiting no diarrhea or constipation no fevers chills or night sweats. She denies any peripheral paresthesias. Her appetite is down a little bit. We discussed increasing calorie dense foods. Remainder of the 10 point review of systems is negative.  MEDICAL HISTORY: Past Medical History  Diagnosis Date  . Chronic bronchitis   . Palpitation     Tachycardia reported by monitor clerk during a symptomatic spell  . Chest pain     Admitted to [facility] in 09/2011; refused stress test  . Atrial septal defect 1996    Surgical repair in 1996  . Tobacco abuse     60 pack years; 1.5 packs per day  . Anxiety   . Anemia   . Breast cancer     ALLERGIES:  is allergic to codeine.  MEDICATIONS:  Current Outpatient Prescriptions  Medication Sig Dispense Refill  . dexamethasone (DECADRON) 4 MG tablet Take 4 mg by mouth 2 (two) times daily with a meal.      . lidocaine-prilocaine (EMLA) cream Apply 1 application topically as needed.      Marland Kitchen LORazepam (  ATIVAN) 1 MG tablet Take 1 tablet (1 mg total) by mouth every 6 (six) hours as needed for anxiety.  30 tablet  1  . prochlorperazine (COMPAZINE) 10 MG tablet Take 1 tablet (10 mg total) by mouth every 6 (six) hours as needed (Nausea or vomiting).  30 tablet  1   No current facility-administered medications for this visit.    SURGICAL HISTORY:  Past Surgical History  Procedure Laterality Date  . Cholecystectomy    . Cesarean section     . Vein bypass surgery    . Breast biopsy Bilateral     REVIEW OF SYSTEMS:  Pertinent items are noted in HPI.     PHYSICAL EXAMINATION: Blood pressure 122/71, pulse 75, temperature 98.3 F (36.8 C), temperature source Oral, resp. rate 18, height 5' (1.524 m), weight 125 lb 6.4 oz (56.881 kg), last menstrual period 05/20/2011. Body mass index is 24.49 kg/(m^2). ECOG PERFORMANCE STATUS: 1 - Symptomatic but completely ambulatory  Well-developed nourished female in no acute distress HEENT exam: EOMI PERRLA sclerae anicteric no conjunctival pallor oral mucosa is moist adequate oral dental hygiene no posterior pharyngeal wall erythema Lymph nodes: Lymph node survey in the cervical supraclavicular node was negative patient is found to have palpable lymph node in the axilla Lungs: Clear to auscultation and percussion Cardiovascular: Regular rate rhythm Abdomen: Soft nontender nondistended bowel sounds are present no hepatosplenomegaly Extremities: No clubbing edema or cyanosis Neuro: Patient's alert oriented otherwise nonfocal Breasts: bilateral masses are smaller and less prominent    LABORATORY DATA: Lab Results  Component Value Date   WBC 8.8 10/06/2013   HGB 11.8 10/06/2013   HCT 37.9 10/06/2013   MCV 70.3* 10/06/2013   PLT 169 10/06/2013      Chemistry      Component Value Date/Time   NA 141 09/29/2013 0926   NA 141 09/23/2012 0414   K 3.7 09/29/2013 0926   K 3.8 09/23/2012 0414   CL 107 09/23/2012 0414   CO2 25 09/29/2013 0926   CO2 29 10/30/2011 1419   BUN 9.6 09/29/2013 0926   BUN 7 09/23/2012 0414   CREATININE 0.8 09/29/2013 0926   CREATININE 0.90 09/23/2012 0414      Component Value Date/Time   CALCIUM 9.2 09/29/2013 0926   CALCIUM 10.1 10/30/2011 1419   ALKPHOS 83 09/29/2013 0926   AST 26 09/29/2013 0926   ALT 24 09/29/2013 0926   BILITOT 0.35 09/29/2013 0926       RADIOGRAPHIC STUDIES:  Ct Chest W Contrast  08/22/2013   CLINICAL DATA:  Breast cancer  EXAM: CT CHEST, ABDOMEN, AND  PELVIS WITH CONTRAST  TECHNIQUE: Multidetector CT imaging of the chest, abdomen and pelvis was performed following the standard protocol during bolus administration of intravenous contrast.  CONTRAST:  172m OMNIPAQUE IOHEXOL 300 MG/ML  SOLN  COMPARISON:  None.  FINDINGS: CT CHEST FINDINGS  Bulky right axillary lymphadenopathy is evident. Index node in the right axilla measures 2.9 x 1.9 cm. There is associated bulky right subpectoral lymphadenopathy with a 1.8 x 2.9 cm lymph node identified. In the lower outer aspect of the right breast, a 3.8 x 3.4 cm rim enhancing lesion is identified.  There is lymphadenopathy in the left axilla as well. 1.8 x 1.2 cm left axillary lymph node is noted. There is a necrotic 1.3 x 1.4 cm lymph node in the left subpectoral region. Other smaller left subpectoral lymph nodes are associated. 3.0 x 3.2 cm necrotic lesion in the upper left breast  is evident.  9 mm nodule is seen in the anterior right thyroid lobe and there is a 5 mm nodule in the posterior left thyroid lobe. Borderline lymphadenopathy is identified in the prevascular space of the AP window. No subcarinal lymphadenopathy. No hilar lymphadenopathy.  The heart size is enlarged. Patient is status post median sternotomy. There is no pericardial or pleural effusion.  Lung windows demonstrate emphysema. No pulmonary parenchymal nodular mass. No focal areas of airspace consolidation. The  Bone windows reveal no worrisome lytic or sclerotic osseous lesions.  CT ABDOMEN AND PELVIS FINDINGS  Fatty infiltration of the liver parenchyma is evident. There is mild prominence of the intra hepatic bile ducts without gross dilatation of the extrahepatic common duct. These changes may be related to prior cholecystectomy. Spleen is normal. The stomach and duodenum are unremarkable. There is a 9 x 6 mm hypo attenuating lesion in the tail of the pancreas. The main pancreatic duct is slightly prominent.  No evidence for adrenal nodule or mass.  The left kidney is normal. 7 x 13 mm lesion in the right kidney is probably a cyst.  No abdominal aortic aneurysm. No free fluid or lymphadenopathy in the abdomen. There is a small 4 x 8 mm nodule in the right omentum.  Imaging through the pelvis shows no free intraperitoneal fluid. No pelvic sidewall lymphadenopathy. Fibroid changes noted in the uterus. Bladder is unremarkable. No adnexal mass. The terminal ileum is normal. The appendix is normal.  Bone windows reveal no worrisome lytic or sclerotic osseous lesions.  IMPRESSION: Large bilateral breast masses with bulky, sometimes necrotic lymphadenopathy in both subpectoral regions and both axillary regions, consistent with metastatic spread.  Tiny bilateral thyroid nodules. These may not be clinically relevant in this individual.  Mild prominence of the bile ducts and pancreatic duct. The biliary prominence may be secondary to the cholecystectomy. Main pancreatic duct prominence is of indeterminate etiology. There is a 9 x 6 mm hypo attenuating lesion in the tail the pancreas which could represent a tiny pseudocyst or cystic neoplasm.  8 mm right omental nodule is nonspecific. Attention to this area on follow-up imaging is recommended.   Electronically Signed   By: Misty Stanley M.D.   On: 08/22/2013 17:00   Ct Abdomen Pelvis W Contrast  08/22/2013   CLINICAL DATA:  Breast cancer  EXAM: CT CHEST, ABDOMEN, AND PELVIS WITH CONTRAST  TECHNIQUE: Multidetector CT imaging of the chest, abdomen and pelvis was performed following the standard protocol during bolus administration of intravenous contrast.  CONTRAST:  145m OMNIPAQUE IOHEXOL 300 MG/ML  SOLN  COMPARISON:  None.  FINDINGS: CT CHEST FINDINGS  Bulky right axillary lymphadenopathy is evident. Index node in the right axilla measures 2.9 x 1.9 cm. There is associated bulky right subpectoral lymphadenopathy with a 1.8 x 2.9 cm lymph node identified. In the lower outer aspect of the right breast, a 3.8 x 3.4 cm  rim enhancing lesion is identified.  There is lymphadenopathy in the left axilla as well. 1.8 x 1.2 cm left axillary lymph node is noted. There is a necrotic 1.3 x 1.4 cm lymph node in the left subpectoral region. Other smaller left subpectoral lymph nodes are associated. 3.0 x 3.2 cm necrotic lesion in the upper left breast is evident.  9 mm nodule is seen in the anterior right thyroid lobe and there is a 5 mm nodule in the posterior left thyroid lobe. Borderline lymphadenopathy is identified in the prevascular space of the AP window. No  subcarinal lymphadenopathy. No hilar lymphadenopathy.  The heart size is enlarged. Patient is status post median sternotomy. There is no pericardial or pleural effusion.  Lung windows demonstrate emphysema. No pulmonary parenchymal nodular mass. No focal areas of airspace consolidation. The  Bone windows reveal no worrisome lytic or sclerotic osseous lesions.  CT ABDOMEN AND PELVIS FINDINGS  Fatty infiltration of the liver parenchyma is evident. There is mild prominence of the intra hepatic bile ducts without gross dilatation of the extrahepatic common duct. These changes may be related to prior cholecystectomy. Spleen is normal. The stomach and duodenum are unremarkable. There is a 9 x 6 mm hypo attenuating lesion in the tail of the pancreas. The main pancreatic duct is slightly prominent.  No evidence for adrenal nodule or mass. The left kidney is normal. 7 x 13 mm lesion in the right kidney is probably a cyst.  No abdominal aortic aneurysm. No free fluid or lymphadenopathy in the abdomen. There is a small 4 x 8 mm nodule in the right omentum.  Imaging through the pelvis shows no free intraperitoneal fluid. No pelvic sidewall lymphadenopathy. Fibroid changes noted in the uterus. Bladder is unremarkable. No adnexal mass. The terminal ileum is normal. The appendix is normal.  Bone windows reveal no worrisome lytic or sclerotic osseous lesions.  IMPRESSION: Large bilateral breast  masses with bulky, sometimes necrotic lymphadenopathy in both subpectoral regions and both axillary regions, consistent with metastatic spread.  Tiny bilateral thyroid nodules. These may not be clinically relevant in this individual.  Mild prominence of the bile ducts and pancreatic duct. The biliary prominence may be secondary to the cholecystectomy. Main pancreatic duct prominence is of indeterminate etiology. There is a 9 x 6 mm hypo attenuating lesion in the tail the pancreas which could represent a tiny pseudocyst or cystic neoplasm.  8 mm right omental nodule is nonspecific. Attention to this area on follow-up imaging is recommended.   Electronically Signed   By: Misty Stanley M.D.   On: 08/22/2013 17:00   Mr Breast Bilateral W Wo Contrast  08/25/2013   CLINICAL DATA:  63 year old female with newly diagnosed cancers in both breasts with axillary lymphatic spread. Preoperative/treatment planning.  EXAM: BILATERAL BREAST MRI WITH AND WITHOUT CONTRAST  LABS:  None  TECHNIQUE: Multiplanar, multisequence MR images of both breasts were obtained prior to and following the intravenous administration of 49m of MultiHance.  THREE-DIMENSIONAL MR IMAGE RENDERING ON INDEPENDENT WORKSTATION:  Three-dimensional MR images were rendered by post-processing of the original MR data on an independent workstation. The three-dimensional MR images were interpreted, and findings are reported in the following complete MRI report for this study. Three dimensional images were evaluated at the independent DynaCad workstation  COMPARISON:  08/07/2013 and prior studies  FINDINGS: Breast composition: b.  Scattered fibroglandular tissue.  Background parenchymal enhancement: Moderate  Right breast: A 4 x 4 x 5 cm (AP X transverse X CC) irregular enhancing mass with rapid washout kinetics is identified within the posterior central and upper-outer right breast, containing biopsy clip artifact - compatible with biopsy proven neoplasm. Non  masslike enhancement and small scattered enhancing masses within the central and most of the upper right breast are identified, with the entire area of abnormal enhancement encompassing a region measuring 8 x 9 x 7 cm (AP X transverse X CC).  Left breast: A 4.5 x 4 x 4.5 cm (AP X transverse X CC) irregular enhancing mass with rapid washout kinetics is identified within the upper/ upper-outer left breast,  containing biopsy clip artifact compatible with biopsy proven neoplasm. Non masslike enhancement and small scattered enhancing masses within the majority of the upper left breast are identified, with the entire area of abnormal enhancement encompassing a region measuring 8 x 5 x 5.5 cm (AP X transverse X CC).  Lymph nodes: Enlarged bilateral level 1, level 2 and level 3 axillary lymph nodes are compatible with lymphatic spread. A 1 cm left internal mammary lymph node is also identified.  Ancillary findings:  None.  IMPRESSION: Biopsy proven neoplasm within both breasts, with bilateral axillary (level 1, level 2 and level 3) and left internal mammary lymphatic spread. The entire area of abnormal enhancement/neoplasm measures 8 x 9 x 7 cm within the right breast and 8 x 5 x 5.5 cm within the left breast.  RECOMMENDATION: Treatment plan  BI-RADS CATEGORY  6: Known biopsy-proven malignancy - appropriate action should be taken.   Electronically Signed   By: Hassan Rowan M.D.   On: 08/25/2013 11:12   Nm Pet Image Initial (pi) Skull Base To Thigh  08/22/2013   CLINICAL DATA:  Initial treatment strategy for breast carcinoma. Bilateral node positive breast carcinoma.  EXAM: NUCLEAR MEDICINE PET SKULL BASE TO THIGH  FASTING BLOOD GLUCOSE:  Value: 135m/dl  TECHNIQUE: 17.4 mCi F-18 FDG was injected intravenously. CT data was obtained and used for attenuation correction and anatomic localization only. (This was not acquired as a diagnostic CT examination.) Additional exam technical data entered on technologist worksheet.   COMPARISON:  CT CHEST W/CM dated 08/22/2013  FINDINGS: NECK  No hypermetabolic lymph nodes in the neck.  CHEST  Bilateral hypermetabolic breast masses. These are intensely hypermetabolic with the right breast mass with SUV max 14.4 and the right breast mass with SUV max 15.1.  There are bilateral hypermetabolic axillary lymph nodes extending to the subpectoral also positions. 23 mm right axial lymph node with SUV max 14.4 (image 63). There is suspicion for a mildly hypermetabolic left internal mammary lymph node measuring 6 mm on image 64. Essentially hypermetabolic small prevascular lymph nodes anterior aorta are difficult to image. No suspicious pulmonary nodules. No paratracheal adenopathy.  ABDOMEN/PELVIS  No abnormal activity within the liver. No hypermetabolic abdominal pelvic lymph nodes.  There is a focus of intense hypermetabolic activity within the left aspect of the uterus in the lower uterine segment. There is an enhancing focus at this level measuring 29 x 26 mm (image 95, series 2) of the of dedicated CT scan of the same day. This intensely hypermetabolic with SUV max 101.6  SKELETON  There is a hypermetabolic focus within the posterior right iliac bone (image 170) with SUV max 6.2. There is no clear CT correlation this lesion.  IMPRESSION: 1. Bilateral hypermetabolic breast masses. 2. Bilateral hypermetabolic axillary nodal metastasis extending to the subpectoral nodal station. . 3. Concern for left internal mammary nodal metastasis. 4. Focal metabolic activity in post right iliac bone is consistent with bone metastasis (this is CT occult). 5. Hypermetabolic focus within the left aspect of lower uterine segment may represent a leiomyoma. Recommend pelvic ultrasound to exclude endometrial carcinoma.   Electronically Signed   By: SSuzy BouchardM.D.   On: 08/22/2013 17:30   Ir Fluoro Guide Cv Line Right  09/08/2013   CLINICAL DATA:  63year old female with bilateral breast cancer. She requires  durable central venous access for chemotherapy.  EXAM: IR RIGHT FLOURO GUIDE CV LINE; IR ULTRASOUND GUIDANCE VASC ACCESS RIGHT  Date: 09/08/2013  TECHNIQUE: The right  neck and chest was prepped with chlorhexidine, and draped in the usual sterile fashion using maximum barrier technique (cap and mask, sterile gown, sterile gloves, large sterile sheet, hand hygiene and cutaneous antiseptic). Antibiotic prophylaxis was provided with twog Ancef administered IV one hour prior to skin incision. Local anesthesia was attained by infiltration with 1% lidocaine with epinephrine.  Ultrasound demonstrated patency of the right internal jugular vein, and this was documented with an image. Under real-time ultrasound guidance, this vein was accessed with a 21 gauge micropuncture needle and image documentation was performed. A small dermatotomy was made at the access site with an 11 scalpel. A 0.018" wire was advanced into the SVC and the access needle exchanged for a 58F micropuncture vascular sheath. The 0.018" wire was then removed and a 0.035" wire advanced into the IVC.  An appropriate location for the subcutaneous reservoir was selected below the clavicle and an incision was made through the skin and underlying soft tissues. The subcutaneous tissues were then dissected using a combination of blunt and sharp surgical technique and a pocket was formed. A single lumen power injectable portacatheter was then tunneled through the subcutaneous tissues from the pocket to the dermatotomy and the port reservoir placed within the subcutaneous pocket.  The venous access site was then serially dilated and a peel away vascular sheath placed over the wire. The wire was removed and the port catheter advanced into position under fluoroscopic guidance. The catheter tip is positioned in the upper right atrium. This was documented with a spot image. The portacatheter was then tested and found to flush and aspirate well. The port was flushed with  saline followed by 100 units/mL heparinized saline.  The pocket was then closed in two layers using first subdermal inverted interrupted absorbable sutures followed by a running subcuticular suture. The epidermis was then sealed with Dermabond. The dermatotomy at the venous access site was also closed with a single inverted subdermal suture and the epidermis sealed with Dermabond.  ANESTHESIA/SEDATION: Moderate (conscious) sedation was administered during this procedure. A total of 4 mg Versed and 150 mg Fentanyl were administered intravenously. The patient's vital signs were monitored continuously by radiology nursing throughout the course of the procedure.  Total sedation time: 25 minutes  FLUOROSCOPY TIME:  24 seconds  COMPLICATIONS: None.  The patient tolerated the procedure well.  IMPRESSION: Successful placement of a right IJ approach Power Port with ultrasound and fluoroscopic guidance. The catheter is ready for use.  Signed,  Criselda Peaches, MD  [facility]  [facility]   Electronically Signed   By: Jacqulynn Cadet M.D.   On: 09/08/2013 17:38   Ir US Guide Vasc Access Right  09/08/2013   CLINICAL DATA:  63 year old female with bilateral breast cancer. She requires durable central venous access for chemotherapy.  EXAM: IR RIGHT FLOURO GUIDE CV LINE; IR ULTRASOUND GUIDANCE VASC ACCESS RIGHT  Date: 09/08/2013  TECHNIQUE: The right neck and chest was prepped with chlorhexidine, and draped in the usual sterile fashion using maximum barrier technique (cap and mask, sterile gown, sterile gloves, large sterile sheet, hand hygiene and cutaneous antiseptic). Antibiotic prophylaxis was provided with twog Ancef administered IV one hour prior to skin incision. Local anesthesia was attained by infiltration with 1% lidocaine with epinephrine.  Ultrasound demonstrated patency of the right internal jugular vein, and this was documented with an image. Under real-time  ultrasound guidance, this vein was accessed with a 21 gauge micropuncture needle and image documentation was  performed. A small dermatotomy was made at the access site with an 11 scalpel. A 0.018" wire was advanced into the SVC and the access needle exchanged for a 74F micropuncture vascular sheath. The 0.018" wire was then removed and a 0.035" wire advanced into the IVC.  An appropriate location for the subcutaneous reservoir was selected below the clavicle and an incision was made through the skin and underlying soft tissues. The subcutaneous tissues were then dissected using a combination of blunt and sharp surgical technique and a pocket was formed. A single lumen power injectable portacatheter was then tunneled through the subcutaneous tissues from the pocket to the dermatotomy and the port reservoir placed within the subcutaneous pocket.  The venous access site was then serially dilated and a peel away vascular sheath placed over the wire. The wire was removed and the port catheter advanced into position under fluoroscopic guidance. The catheter tip is positioned in the upper right atrium. This was documented with a spot image. The portacatheter was then tested and found to flush and aspirate well. The port was flushed with saline followed by 100 units/mL heparinized saline.  The pocket was then closed in two layers using first subdermal inverted interrupted absorbable sutures followed by a running subcuticular suture. The epidermis was then sealed with Dermabond. The dermatotomy at the venous access site was also closed with a single inverted subdermal suture and the epidermis sealed with Dermabond.  ANESTHESIA/SEDATION: Moderate (conscious) sedation was administered during this procedure. A total of 4 mg Versed and 150 mg Fentanyl were administered intravenously. The patient's vital signs were monitored continuously by radiology nursing throughout the course of the procedure.  Total sedation time: 25 minutes   FLUOROSCOPY TIME:  24 seconds  COMPLICATIONS: None.  The patient tolerated the procedure well.  IMPRESSION: Successful placement of a right IJ approach Power Port with ultrasound and fluoroscopic guidance. The catheter is ready for use.  Signed,  Criselda Peaches, MD  [facility]  [facility]   Electronically Signed   By: Jacqulynn Cadet M.D.   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 She was noted to have a lymph node that was positive for metastatic disease. 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 Patient originally was seen in the multidisciplinary breast clinic. She was recommended neoadjuvant treatment with chemotherapy consisting of FEC dose dense x6 cycles followed by Taxol weekly x12 weeks. Patient does desire breast conservation however it was discussed with her that that may or may not be feasible due to the extent of her disease.  #2 status post cycle 2 day 8 of FEC. Overall she's tolerated the chemotherapy very well.  #3 pain control: Patient's pain is well-controlled. We are minimizing use of narcotics.  #4 tobacco abuse: Patient has quit smoking!!!!   #5 cardiovascular: Stable  #6 anxiety: Improved   #7 followup: Patient will be seen by in one week for followup with me as well as blood work and chemotherapy cycle 3 day 1  All questions were answered. The patient knows to call the clinic with any problems, questions or concerns. We can certainly see the patient much sooner if necessary.  I spent 15  minutes counseling the patient face to face. The total time spent in the appointment was 15  minutes.    Marcy Panning, MD Medical/Oncology [facility] [phone] (beeper) [phone] (Office)  10/06/2013, 2:36 PM

## 2013-10-12 ENCOUNTER — Encounter: Payer: Self-pay | Admitting: Oncology

## 2013-10-13 ENCOUNTER — Other Ambulatory Visit (HOSPITAL_BASED_OUTPATIENT_CLINIC_OR_DEPARTMENT_OTHER): Payer: Medicare Other

## 2013-10-13 ENCOUNTER — Ambulatory Visit (HOSPITAL_BASED_OUTPATIENT_CLINIC_OR_DEPARTMENT_OTHER): Payer: Medicare Other

## 2013-10-13 ENCOUNTER — Ambulatory Visit (HOSPITAL_BASED_OUTPATIENT_CLINIC_OR_DEPARTMENT_OTHER): Payer: Medicare Other | Admitting: Oncology

## 2013-10-13 ENCOUNTER — Ambulatory Visit: Payer: Medicare Other | Admitting: Nutrition

## 2013-10-13 ENCOUNTER — Other Ambulatory Visit: Payer: Self-pay | Admitting: *Deleted

## 2013-10-13 ENCOUNTER — Encounter: Payer: Self-pay | Admitting: Oncology

## 2013-10-13 ENCOUNTER — Other Ambulatory Visit: Payer: Medicare Other

## 2013-10-13 VITALS — BP 151/83 | HR 74 | Temp 98.7°F | Resp 18 | Ht 60.0 in | Wt 130.0 lb

## 2013-10-13 DIAGNOSIS — C50912 Malignant neoplasm of unspecified site of left female breast: Principal | ICD-10-CM

## 2013-10-13 DIAGNOSIS — C50919 Malignant neoplasm of unspecified site of unspecified female breast: Secondary | ICD-10-CM

## 2013-10-13 DIAGNOSIS — L539 Erythematous condition, unspecified: Secondary | ICD-10-CM

## 2013-10-13 DIAGNOSIS — C773 Secondary and unspecified malignant neoplasm of axilla and upper limb lymph nodes: Secondary | ICD-10-CM

## 2013-10-13 DIAGNOSIS — C50911 Malignant neoplasm of unspecified site of right female breast: Secondary | ICD-10-CM

## 2013-10-13 DIAGNOSIS — Z5111 Encounter for antineoplastic chemotherapy: Secondary | ICD-10-CM

## 2013-10-13 DIAGNOSIS — F411 Generalized anxiety disorder: Secondary | ICD-10-CM

## 2013-10-13 DIAGNOSIS — D72829 Elevated white blood cell count, unspecified: Secondary | ICD-10-CM

## 2013-10-13 LAB — COMPREHENSIVE METABOLIC PANEL (CC13)
ALT: 25 U/L (ref 0–55)
AST: 25 U/L (ref 5–34)
Albumin: 3.7 g/dL (ref 3.5–5.0)
Alkaline Phosphatase: 112 U/L (ref 40–150)
Anion Gap: 12 mEq/L — ABNORMAL HIGH (ref 3–11)
BILIRUBIN TOTAL: 0.48 mg/dL (ref 0.20–1.20)
BUN: 9.7 mg/dL (ref 7.0–26.0)
CO2: 23 mEq/L (ref 22–29)
CREATININE: 0.8 mg/dL (ref 0.6–1.1)
Calcium: 9.5 mg/dL (ref 8.4–10.4)
Chloride: 106 mEq/L (ref 98–109)
Glucose: 125 mg/dl (ref 70–140)
Potassium: 3.7 mEq/L (ref 3.5–5.1)
Sodium: 142 mEq/L (ref 136–145)
TOTAL PROTEIN: 6.7 g/dL (ref 6.4–8.3)

## 2013-10-13 LAB — CBC WITH DIFFERENTIAL/PLATELET
BASO%: 1.8 % (ref 0.0–2.0)
Basophils Absolute: 0.5 10*3/uL — ABNORMAL HIGH (ref 0.0–0.1)
EOS%: 0.5 % (ref 0.0–7.0)
Eosinophils Absolute: 0.1 10*3/uL (ref 0.0–0.5)
HCT: 32.5 % — ABNORMAL LOW (ref 34.8–46.6)
HGB: 10.7 g/dL — ABNORMAL LOW (ref 11.6–15.9)
LYMPH%: 10.9 % — ABNORMAL LOW (ref 14.0–49.7)
MCH: 22.5 pg — ABNORMAL LOW (ref 25.1–34.0)
MCHC: 32.9 g/dL (ref 31.5–36.0)
MCV: 68.3 fL — AB (ref 79.5–101.0)
MONO#: 1.3 10*3/uL — ABNORMAL HIGH (ref 0.1–0.9)
MONO%: 4.5 % (ref 0.0–14.0)
NEUT#: 23.4 10*3/uL — ABNORMAL HIGH (ref 1.5–6.5)
NEUT%: 82.3 % — ABNORMAL HIGH (ref 38.4–76.8)
NRBC: 0 % (ref 0–0)
Platelets: 164 10*3/uL (ref 145–400)
RBC: 4.76 10*6/uL (ref 3.70–5.45)
RDW: 13.6 % (ref 11.2–14.5)
WBC: 28.4 10*3/uL — AB (ref 3.9–10.3)
lymph#: 3.1 10*3/uL (ref 0.9–3.3)

## 2013-10-13 MED ORDER — PALONOSETRON HCL INJECTION 0.25 MG/5ML
INTRAVENOUS | Status: AC
  Filled 2013-10-13: qty 5

## 2013-10-13 MED ORDER — DEXAMETHASONE SODIUM PHOSPHATE 20 MG/5ML IJ SOLN
12.0000 mg | Freq: Once | INTRAMUSCULAR | Status: AC
  Administered 2013-10-13: 12 mg via INTRAVENOUS

## 2013-10-13 MED ORDER — SODIUM CHLORIDE 0.9 % IV SOLN
150.0000 mg | Freq: Once | INTRAVENOUS | Status: AC
  Administered 2013-10-13: 150 mg via INTRAVENOUS
  Filled 2013-10-13: qty 5

## 2013-10-13 MED ORDER — FLUOROURACIL CHEMO INJECTION 2.5 GM/50ML
500.0000 mg/m2 | Freq: Once | INTRAVENOUS | Status: AC
  Administered 2013-10-13: 800 mg via INTRAVENOUS
  Filled 2013-10-13: qty 16

## 2013-10-13 MED ORDER — HEPARIN SOD (PORK) LOCK FLUSH 100 UNIT/ML IV SOLN
500.0000 [IU] | Freq: Once | INTRAVENOUS | Status: AC | PRN
  Administered 2013-10-13: 500 [IU]
  Filled 2013-10-13: qty 5

## 2013-10-13 MED ORDER — SODIUM CHLORIDE 0.9 % IV SOLN
500.0000 mg/m2 | Freq: Once | INTRAVENOUS | Status: AC
  Administered 2013-10-13: 800 mg via INTRAVENOUS
  Filled 2013-10-13: qty 40

## 2013-10-13 MED ORDER — DEXAMETHASONE SODIUM PHOSPHATE 20 MG/5ML IJ SOLN
INTRAMUSCULAR | Status: AC
  Filled 2013-10-13: qty 5

## 2013-10-13 MED ORDER — PALONOSETRON HCL INJECTION 0.25 MG/5ML
0.2500 mg | Freq: Once | INTRAVENOUS | Status: AC
  Administered 2013-10-13: 0.25 mg via INTRAVENOUS

## 2013-10-13 MED ORDER — SODIUM CHLORIDE 0.9 % IJ SOLN
10.0000 mL | INTRAMUSCULAR | Status: DC | PRN
  Administered 2013-10-13: 10 mL
  Filled 2013-10-13: qty 10

## 2013-10-13 MED ORDER — EPIRUBICIN HCL CHEMO IV INJECTION 200 MG/100ML
100.0000 mg/m2 | Freq: Once | INTRAVENOUS | Status: AC
  Administered 2013-10-13: 158 mg via INTRAVENOUS
  Filled 2013-10-13: qty 79

## 2013-10-13 MED ORDER — SODIUM CHLORIDE 0.9 % IV SOLN
Freq: Once | INTRAVENOUS | Status: AC
  Administered 2013-10-13: 12:00:00 via INTRAVENOUS

## 2013-10-13 NOTE — Patient Instructions (Signed)
[place] Discharge Instructions for Patients Receiving Chemotherapy  Today you received the following chemotherapy agents epirubicin/fluorouracil/cytoxan.   To help prevent nausea and vomiting after your treatment, we encourage you to take your nausea medication as directed.  If you develop nausea and vomiting that is not controlled by your nausea medication, call the clinic.   BELOW ARE SYMPTOMS THAT SHOULD BE REPORTED IMMEDIATELY:  *FEVER GREATER THAN 100.5 F  *CHILLS WITH OR WITHOUT FEVER  NAUSEA AND VOMITING THAT IS NOT CONTROLLED WITH YOUR NAUSEA MEDICATION  *UNUSUAL SHORTNESS OF BREATH  *UNUSUAL BRUISING OR BLEEDING  TENDERNESS IN MOUTH AND THROAT WITH OR WITHOUT PRESENCE OF ULCERS  *URINARY PROBLEMS  *BOWEL PROBLEMS  UNUSUAL RASH Items with * indicate a potential emergency and should be followed up as soon as possible.  Feel free to call the clinic you have any questions or concerns. The clinic phone number is [phone].

## 2013-10-13 NOTE — Progress Notes (Signed)
OFFICE PROGRESS NOTE  CC  Paula Hampshire, MD [place] [address] Dr. Thea Silversmith  Dr. Autumn Messing  DIAGNOSIS:63 year old ER positive breast cancers. Patient is seen in the multidisciplinary breast clinic for discussion of treatment options   STAGE:  Bilateral breast cancer  Primary site: Breast (Bilateral)  Staging method: AJCC 7th Edition  Clinical: Stage IIB (T2, N1, cM0)  Summary: Stage IIB (T2, N1, cM0)     PRIOR THERAPY: 1. Patient states that she felt pain in her breasts and masses. Because of this she went on to have further workup performed including mammograms and ultrasound. On 07/23/2013 patient had bilateral diagnostic mammogram she was noted to have bilateral breast masses on the right irregular mass lying posterior lateral breast associated with a large rounded and dense axillary nodes. The left showed slightly smaller irregular mass lying at the 11:00 to 12:00 position. There was a smaller adjacent cyst. Also noted to have a large dense axillary lymph nodes on the left. Because of this she was recommended ultrasound and biopsies. 08/07/2013 patient underwent ultrasound-guided right breast core needle biopsy will. As well as left eye out see. 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 The lymph node was positive for metastatic disease. On the left patient had a biopsy that also showed invasive ductal carcinoma grade 3 ER positive PR negative HER-2/neu negative with a proliferation marker Ki-67 80%.Her clinical and radiologic staging was T2 NX on the left side T2 N1 on the right side.  2. Patient is seen at the multidisciplinary breast clinic. Neoadjuvant treatment was recommended with chemotherapy consisting of FEC followed by Taxol. Patient has had her Port-A-Cath placed she also had staging studies performed was also recorded. She has  had chemotherapy teaching class. All of her antiemetics were prescribed and she has become up from her pharmacy.  3. Patient was begun on neoadjuvant FEC dose dense starting to 2/16 /2015. Total of 6 cycles is planned. This will be followed by 12 weeks of Taxol  CURRENT THERAPY: Proceed with Neoadjuvant cycle 3 day 1 of dose dense FEC with day 2 neulasta   INTERVAL HISTORY: Paula Navarro 63 y.o. female returns for followup visit. Overall patient seems to be doing well. As far she's tolerated the chemotherapy well. She is here for cycle 3 day 1 of dose dense FEC. Her breast exam shows reduction in the size of both breasts tumors. She denies any headaches double the blurring of vision. She does have some dry eyes. She is using artificial tear drops. Today she denies any headaches double vision blurring of vision fevers chills night sweats. No shortness of breath chest pains palpitations. No abdominal pain no diarrhea or constipation. She has no easy bruising or bleeding. She has no myalgias and arthralgias. No peripheral paresthesias or gait disturbances. Remainder of the 10 point review of systems is negative.  MEDICAL HISTORY: Past Medical History  Diagnosis Date  . Chronic bronchitis   . Palpitation     Tachycardia reported by monitor clerk during a symptomatic spell  . Chest pain     Admitted to [facility] in 09/2011; refused stress test  . Atrial septal defect 1996    Surgical repair in 1996  . Tobacco abuse     60 pack years; 1.5 packs per day  . Anxiety   . Anemia   . Breast cancer     ALLERGIES:  is allergic to codeine.  MEDICATIONS:  Current Outpatient Prescriptions  Medication Sig Dispense Refill  . dexamethasone (DECADRON) 4 MG tablet Take 4 mg by mouth 2 (two) times daily with a meal.      . lidocaine-prilocaine (EMLA) cream Apply 1 application topically as needed.      Marland Kitchen LORazepam (ATIVAN) 1 MG tablet Take 1 tablet (1 mg total) by mouth every 6 (six) hours as needed for anxiety.   30 tablet  1  . prochlorperazine (COMPAZINE) 10 MG tablet Take 1 tablet (10 mg total) by mouth every 6 (six) hours as needed (Nausea or vomiting).  30 tablet  1  . zolpidem (AMBIEN) 5 MG tablet Take 1 tablet (5 mg total) by mouth at bedtime as needed for sleep.  30 tablet  0   No current facility-administered medications for this visit.    SURGICAL HISTORY:  Past Surgical History  Procedure Laterality Date  . Cholecystectomy    . Cesarean section    . Vein bypass surgery    . Breast biopsy Bilateral     REVIEW OF SYSTEMS:  Pertinent items are noted in HPI.     PHYSICAL EXAMINATION: Blood pressure 151/83, pulse 74, temperature 98.7 F (37.1 C), temperature source Oral, resp. rate 18, height 5' (1.524 m), weight 130 lb (58.968 kg), last menstrual period 05/20/2011. Body mass index is 25.39 kg/(m^2). ECOG PERFORMANCE STATUS: 1 - Symptomatic but completely ambulatory  Well-developed nourished female in no acute distress HEENT exam: EOMI PERRLA sclerae anicteric no conjunctival pallor oral mucosa is moist adequate oral dental hygiene no posterior pharyngeal wall erythema Lymph nodes: Lymph node survey in the cervical supraclavicular node was negative patient is found to have palpable lymph node in the axilla Lungs: Clear to auscultation and percussion Cardiovascular: Regular rate rhythm Abdomen: Soft nontender nondistended bowel sounds are present no hepatosplenomegaly Extremities: No clubbing edema or cyanosis Neuro: Patient's alert oriented otherwise nonfocal Breasts: bilateral masses are smaller and less prominent    LABORATORY DATA: Lab Results  Component Value Date   WBC 28.4* 10/13/2013   HGB 10.7* 10/13/2013   HCT 32.5* 10/13/2013   MCV 68.3* 10/13/2013   PLT 164 10/13/2013      Chemistry      Component Value Date/Time   NA 140 10/06/2013 1405   NA 141 09/23/2012 0414   K 3.9 10/06/2013 1405   K 3.8 09/23/2012 0414   CL 107 09/23/2012 0414   CO2 27 10/06/2013 1405   CO2 29  10/30/2011 1419   BUN 10.1 10/06/2013 1405   BUN 7 09/23/2012 0414   CREATININE 0.8 10/06/2013 1405   CREATININE 0.90 09/23/2012 0414      Component Value Date/Time   CALCIUM 9.7 10/06/2013 1405   CALCIUM 10.1 10/30/2011 1419   ALKPHOS 147 10/06/2013 1405   AST 18 10/06/2013 1405   ALT 17 10/06/2013 1405   BILITOT 0.71 10/06/2013 1405       RADIOGRAPHIC STUDIES:  Ct Chest W Contrast  08/22/2013   CLINICAL DATA:  Breast cancer  EXAM: CT CHEST, ABDOMEN, AND PELVIS WITH CONTRAST  TECHNIQUE: Multidetector CT imaging of the chest, abdomen and pelvis was performed following the standard protocol during bolus administration of intravenous contrast.  CONTRAST:  146m OMNIPAQUE IOHEXOL 300 MG/ML  SOLN  COMPARISON:  None.  FINDINGS: CT CHEST FINDINGS  Bulky right axillary lymphadenopathy is evident. Index node in the right axilla measures 2.9 x 1.9 cm. There is associated bulky right subpectoral lymphadenopathy with a  1.8 x 2.9 cm lymph node identified. In the lower outer aspect of the right breast, a 3.8 x 3.4 cm rim enhancing lesion is identified.  There is lymphadenopathy in the left axilla as well. 1.8 x 1.2 cm left axillary lymph node is noted. There is a necrotic 1.3 x 1.4 cm lymph node in the left subpectoral region. Other smaller left subpectoral lymph nodes are associated. 3.0 x 3.2 cm necrotic lesion in the upper left breast is evident.  9 mm nodule is seen in the anterior right thyroid lobe and there is a 5 mm nodule in the posterior left thyroid lobe. Borderline lymphadenopathy is identified in the prevascular space of the AP window. No subcarinal lymphadenopathy. No hilar lymphadenopathy.  The heart size is enlarged. Patient is status post median sternotomy. There is no pericardial or pleural effusion.  Lung windows demonstrate emphysema. No pulmonary parenchymal nodular mass. No focal areas of airspace consolidation. The  Bone windows reveal no worrisome lytic or sclerotic osseous lesions.  CT ABDOMEN AND PELVIS  FINDINGS  Fatty infiltration of the liver parenchyma is evident. There is mild prominence of the intra hepatic bile ducts without gross dilatation of the extrahepatic common duct. These changes may be related to prior cholecystectomy. Spleen is normal. The stomach and duodenum are unremarkable. There is a 9 x 6 mm hypo attenuating lesion in the tail of the pancreas. The main pancreatic duct is slightly prominent.  No evidence for adrenal nodule or mass. The left kidney is normal. 7 x 13 mm lesion in the right kidney is probably a cyst.  No abdominal aortic aneurysm. No free fluid or lymphadenopathy in the abdomen. There is a small 4 x 8 mm nodule in the right omentum.  Imaging through the pelvis shows no free intraperitoneal fluid. No pelvic sidewall lymphadenopathy. Fibroid changes noted in the uterus. Bladder is unremarkable. No adnexal mass. The terminal ileum is normal. The appendix is normal.  Bone windows reveal no worrisome lytic or sclerotic osseous lesions.  IMPRESSION: Large bilateral breast masses with bulky, sometimes necrotic lymphadenopathy in both subpectoral regions and both axillary regions, consistent with metastatic spread.  Tiny bilateral thyroid nodules. These may not be clinically relevant in this individual.  Mild prominence of the bile ducts and pancreatic duct. The biliary prominence may be secondary to the cholecystectomy. Main pancreatic duct prominence is of indeterminate etiology. There is a 9 x 6 mm hypo attenuating lesion in the tail the pancreas which could represent a tiny pseudocyst or cystic neoplasm.  8 mm right omental nodule is nonspecific. Attention to this area on follow-up imaging is recommended.   Electronically Signed   By: Misty Stanley M.D.   On: 08/22/2013 17:00   Ct Abdomen Pelvis W Contrast  08/22/2013   CLINICAL DATA:  Breast cancer  EXAM: CT CHEST, ABDOMEN, AND PELVIS WITH CONTRAST  TECHNIQUE: Multidetector CT imaging of the chest, abdomen and pelvis was performed  following the standard protocol during bolus administration of intravenous contrast.  CONTRAST:  15m OMNIPAQUE IOHEXOL 300 MG/ML  SOLN  COMPARISON:  None.  FINDINGS: CT CHEST FINDINGS  Bulky right axillary lymphadenopathy is evident. Index node in the right axilla measures 2.9 x 1.9 cm. There is associated bulky right subpectoral lymphadenopathy with a 1.8 x 2.9 cm lymph node identified. In the lower outer aspect of the right breast, a 3.8 x 3.4 cm rim enhancing lesion is identified.  There is lymphadenopathy in the left axilla as well. 1.8 x 1.2 cm  left axillary lymph node is noted. There is a necrotic 1.3 x 1.4 cm lymph node in the left subpectoral region. Other smaller left subpectoral lymph nodes are associated. 3.0 x 3.2 cm necrotic lesion in the upper left breast is evident.  9 mm nodule is seen in the anterior right thyroid lobe and there is a 5 mm nodule in the posterior left thyroid lobe. Borderline lymphadenopathy is identified in the prevascular space of the AP window. No subcarinal lymphadenopathy. No hilar lymphadenopathy.  The heart size is enlarged. Patient is status post median sternotomy. There is no pericardial or pleural effusion.  Lung windows demonstrate emphysema. No pulmonary parenchymal nodular mass. No focal areas of airspace consolidation. The  Bone windows reveal no worrisome lytic or sclerotic osseous lesions.  CT ABDOMEN AND PELVIS FINDINGS  Fatty infiltration of the liver parenchyma is evident. There is mild prominence of the intra hepatic bile ducts without gross dilatation of the extrahepatic common duct. These changes may be related to prior cholecystectomy. Spleen is normal. The stomach and duodenum are unremarkable. There is a 9 x 6 mm hypo attenuating lesion in the tail of the pancreas. The main pancreatic duct is slightly prominent.  No evidence for adrenal nodule or mass. The left kidney is normal. 7 x 13 mm lesion in the right kidney is probably a cyst.  No abdominal aortic  aneurysm. No free fluid or lymphadenopathy in the abdomen. There is a small 4 x 8 mm nodule in the right omentum.  Imaging through the pelvis shows no free intraperitoneal fluid. No pelvic sidewall lymphadenopathy. Fibroid changes noted in the uterus. Bladder is unremarkable. No adnexal mass. The terminal ileum is normal. The appendix is normal.  Bone windows reveal no worrisome lytic or sclerotic osseous lesions.  IMPRESSION: Large bilateral breast masses with bulky, sometimes necrotic lymphadenopathy in both subpectoral regions and both axillary regions, consistent with metastatic spread.  Tiny bilateral thyroid nodules. These may not be clinically relevant in this individual.  Mild prominence of the bile ducts and pancreatic duct. The biliary prominence may be secondary to the cholecystectomy. Main pancreatic duct prominence is of indeterminate etiology. There is a 9 x 6 mm hypo attenuating lesion in the tail the pancreas which could represent a tiny pseudocyst or cystic neoplasm.  8 mm right omental nodule is nonspecific. Attention to this area on follow-up imaging is recommended.   Electronically Signed   By: Misty Stanley M.D.   On: 08/22/2013 17:00   Mr Breast Bilateral W Wo Contrast  08/25/2013   CLINICAL DATA:  63 year old female with newly diagnosed cancers in both breasts with axillary lymphatic spread. Preoperative/treatment planning.  EXAM: BILATERAL BREAST MRI WITH AND WITHOUT CONTRAST  LABS:  None  TECHNIQUE: Multiplanar, multisequence MR images of both breasts were obtained prior to and following the intravenous administration of 73m of MultiHance.  THREE-DIMENSIONAL MR IMAGE RENDERING ON INDEPENDENT WORKSTATION:  Three-dimensional MR images were rendered by post-processing of the original MR data on an independent workstation. The three-dimensional MR images were interpreted, and findings are reported in the following complete MRI report for this study. Three dimensional images were evaluated at  the independent DynaCad workstation  COMPARISON:  08/07/2013 and prior studies  FINDINGS: Breast composition: b.  Scattered fibroglandular tissue.  Background parenchymal enhancement: Moderate  Right breast: A 4 x 4 x 5 cm (AP X transverse X CC) irregular enhancing mass with rapid washout kinetics is identified within the posterior central and upper-outer right breast, containing biopsy  clip artifact - compatible with biopsy proven neoplasm. Non masslike enhancement and small scattered enhancing masses within the central and most of the upper right breast are identified, with the entire area of abnormal enhancement encompassing a region measuring 8 x 9 x 7 cm (AP X transverse X CC).  Left breast: A 4.5 x 4 x 4.5 cm (AP X transverse X CC) irregular enhancing mass with rapid washout kinetics is identified within the upper/ upper-outer left breast, containing biopsy clip artifact compatible with biopsy proven neoplasm. Non masslike enhancement and small scattered enhancing masses within the majority of the upper left breast are identified, with the entire area of abnormal enhancement encompassing a region measuring 8 x 5 x 5.5 cm (AP X transverse X CC).  Lymph nodes: Enlarged bilateral level 1, level 2 and level 3 axillary lymph nodes are compatible with lymphatic spread. A 1 cm left internal mammary lymph node is also identified.  Ancillary findings:  None.  IMPRESSION: Biopsy proven neoplasm within both breasts, with bilateral axillary (level 1, level 2 and level 3) and left internal mammary lymphatic spread. The entire area of abnormal enhancement/neoplasm measures 8 x 9 x 7 cm within the right breast and 8 x 5 x 5.5 cm within the left breast.  RECOMMENDATION: Treatment plan  BI-RADS CATEGORY  6: Known biopsy-proven malignancy - appropriate action should be taken.   Electronically Signed   By: Hassan Rowan M.D.   On: 08/25/2013 11:12   Nm Pet Image Initial (pi) Skull Base To Thigh  08/22/2013   CLINICAL DATA:   Initial treatment strategy for breast carcinoma. Bilateral node positive breast carcinoma.  EXAM: NUCLEAR MEDICINE PET SKULL BASE TO THIGH  FASTING BLOOD GLUCOSE:  Value: 190m/dl  TECHNIQUE: 17.4 mCi F-18 FDG was injected intravenously. CT data was obtained and used for attenuation correction and anatomic localization only. (This was not acquired as a diagnostic CT examination.) Additional exam technical data entered on technologist worksheet.  COMPARISON:  CT CHEST W/CM dated 08/22/2013  FINDINGS: NECK  No hypermetabolic lymph nodes in the neck.  CHEST  Bilateral hypermetabolic breast masses. These are intensely hypermetabolic with the right breast mass with SUV max 14.4 and the right breast mass with SUV max 15.1.  There are bilateral hypermetabolic axillary lymph nodes extending to the subpectoral also positions. 23 mm right axial lymph node with SUV max 14.4 (image 63). There is suspicion for a mildly hypermetabolic left internal mammary lymph node measuring 6 mm on image 64. Essentially hypermetabolic small prevascular lymph nodes anterior aorta are difficult to image. No suspicious pulmonary nodules. No paratracheal adenopathy.  ABDOMEN/PELVIS  No abnormal activity within the liver. No hypermetabolic abdominal pelvic lymph nodes.  There is a focus of intense hypermetabolic activity within the left aspect of the uterus in the lower uterine segment. There is an enhancing focus at this level measuring 29 x 26 mm (image 95, series 2) of the of dedicated CT scan of the same day. This intensely hypermetabolic with SUV max 179.8  SKELETON  There is a hypermetabolic focus within the posterior right iliac bone (image 170) with SUV max 6.2. There is no clear CT correlation this lesion.  IMPRESSION: 1. Bilateral hypermetabolic breast masses. 2. Bilateral hypermetabolic axillary nodal metastasis extending to the subpectoral nodal station. . 3. Concern for left internal mammary nodal metastasis. 4. Focal metabolic activity  in post right iliac bone is consistent with bone metastasis (this is CT occult). 5. Hypermetabolic focus within the left aspect of  lower uterine segment may represent a leiomyoma. Recommend pelvic ultrasound to exclude endometrial carcinoma.   Electronically Signed   By: Suzy Bouchard M.D.   On: 08/22/2013 17:30   Ir Fluoro Guide Cv Line Right  09/08/2013   CLINICAL DATA:  63 year old female with bilateral breast cancer. She requires durable central venous access for chemotherapy.  EXAM: IR RIGHT FLOURO GUIDE CV LINE; IR ULTRASOUND GUIDANCE VASC ACCESS RIGHT  Date: 09/08/2013  TECHNIQUE: The right neck and chest was prepped with chlorhexidine, and draped in the usual sterile fashion using maximum barrier technique (cap and mask, sterile gown, sterile gloves, large sterile sheet, hand hygiene and cutaneous antiseptic). Antibiotic prophylaxis was provided with twog Ancef administered IV one hour prior to skin incision. Local anesthesia was attained by infiltration with 1% lidocaine with epinephrine.  Ultrasound demonstrated patency of the right internal jugular vein, and this was documented with an image. Under real-time ultrasound guidance, this vein was accessed with a 21 gauge micropuncture needle and image documentation was performed. A small dermatotomy was made at the access site with an 11 scalpel. A 0.018" wire was advanced into the SVC and the access needle exchanged for a 98F micropuncture vascular sheath. The 0.018" wire was then removed and a 0.035" wire advanced into the IVC.  An appropriate location for the subcutaneous reservoir was selected below the clavicle and an incision was made through the skin and underlying soft tissues. The subcutaneous tissues were then dissected using a combination of blunt and sharp surgical technique and a pocket was formed. A single lumen power injectable portacatheter was then tunneled through the subcutaneous tissues from the pocket to the dermatotomy and the port  reservoir placed within the subcutaneous pocket.  The venous access site was then serially dilated and a peel away vascular sheath placed over the wire. The wire was removed and the port catheter advanced into position under fluoroscopic guidance. The catheter tip is positioned in the upper right atrium. This was documented with a spot image. The portacatheter was then tested and found to flush and aspirate well. The port was flushed with saline followed by 100 units/mL heparinized saline.  The pocket was then closed in two layers using first subdermal inverted interrupted absorbable sutures followed by a running subcuticular suture. The epidermis was then sealed with Dermabond. The dermatotomy at the venous access site was also closed with a single inverted subdermal suture and the epidermis sealed with Dermabond.  ANESTHESIA/SEDATION: Moderate (conscious) sedation was administered during this procedure. A total of 4 mg Versed and 150 mg Fentanyl were administered intravenously. The patient's vital signs were monitored continuously by radiology nursing throughout the course of the procedure.  Total sedation time: 25 minutes  FLUOROSCOPY TIME:  24 seconds  COMPLICATIONS: None.  The patient tolerated the procedure well.  IMPRESSION: Successful placement of a right IJ approach Power Port with ultrasound and fluoroscopic guidance. The catheter is ready for use.  Signed,  Criselda Peaches, MD  [facility]  [facility]   Electronically Signed   By: Jacqulynn Cadet M.D.   On: 09/08/2013 17:38   Ir US Guide Vasc Access Right  09/08/2013   CLINICAL DATA:  63 year old female with bilateral breast cancer. She requires durable central venous access for chemotherapy.  EXAM: IR RIGHT FLOURO GUIDE CV LINE; IR ULTRASOUND GUIDANCE VASC ACCESS RIGHT  Date: 09/08/2013  TECHNIQUE: The right neck and chest was prepped with chlorhexidine, and draped in the usual sterile fashion using  maximum barrier technique (cap and mask, sterile gown, sterile gloves, large sterile sheet, hand hygiene and cutaneous antiseptic). Antibiotic prophylaxis was provided with twog Ancef administered IV one hour prior to skin incision. Local anesthesia was attained by infiltration with 1% lidocaine with epinephrine.  Ultrasound demonstrated patency of the right internal jugular vein, and this was documented with an image. Under real-time ultrasound guidance, this vein was accessed with a 21 gauge micropuncture needle and image documentation was performed. A small dermatotomy was made at the access site with an 11 scalpel. A 0.018" wire was advanced into the SVC and the access needle exchanged for a 95F micropuncture vascular sheath. The 0.018" wire was then removed and a 0.035" wire advanced into the IVC.  An appropriate location for the subcutaneous reservoir was selected below the clavicle and an incision was made through the skin and underlying soft tissues. The subcutaneous tissues were then dissected using a combination of blunt and sharp surgical technique and a pocket was formed. A single lumen power injectable portacatheter was then tunneled through the subcutaneous tissues from the pocket to the dermatotomy and the port reservoir placed within the subcutaneous pocket.  The venous access site was then serially dilated and a peel away vascular sheath placed over the wire. The wire was removed and the port catheter advanced into position under fluoroscopic guidance. The catheter tip is positioned in the upper right atrium. This was documented with a spot image. The portacatheter was then tested and found to flush and aspirate well. The port was flushed with saline followed by 100 units/mL heparinized saline.  The pocket was then closed in two layers using first subdermal inverted interrupted absorbable sutures followed by a running subcuticular suture. The epidermis was then sealed with Dermabond. The dermatotomy  at the venous access site was also closed with a single inverted subdermal suture and the epidermis sealed with Dermabond.  ANESTHESIA/SEDATION: Moderate (conscious) sedation was administered during this procedure. A total of 4 mg Versed and 150 mg Fentanyl were administered intravenously. The patient's vital signs were monitored continuously by radiology nursing throughout the course of the procedure.  Total sedation time: 25 minutes  FLUOROSCOPY TIME:  24 seconds  COMPLICATIONS: None.  The patient tolerated the procedure well.  IMPRESSION: Successful placement of a right IJ approach Power Port with ultrasound and fluoroscopic guidance. The catheter is ready for use.  Signed,  Criselda Peaches, MD  [facility]  [facility]   Electronically Signed   By: Jacqulynn Cadet M.D.   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 She was noted to have a lymph node that was positive for metastatic disease. 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 Patient originally was seen in the multidisciplinary breast clinic. She was recommended neoadjuvant treatment with chemotherapy consisting of FEC dose dense x6 cycles followed by Taxol weekly x12 weeks. Patient does desire breast conservation however it was discussed with her that that may or may not be feasible due to the extent of her disease.  #2 proceed with cycle 3 day 1 of FEC. I reviewed her CBC looks terrific. She will proceed with scheduled chemotherapy today.  #3 pain control: Patient's  pain is well-controlled. We are minimizing use of narcotics.  #4 tobacco abuse: Patient has quit  smoking!!!!   #5 cardiovascular: Stable  #6 anxiety: Improved  #7 elevated WBC count: Likely due to Neulasta, continue to monitor  #8 Port-A-Cath site skin erythema likely due to the Tegaderm. I have recommended not using Tegaderm anymore.  #7 followup: Patient all be seen in followup in one week for blood work and office visit toxicity evaluation.  All questions were answered. The patient knows to call the clinic with any problems, questions or concerns. We can certainly see the patient much sooner if necessary.  I spent 15 minutes counseling the patient face to face. The total time spent in the appointment was 25 minutes.    Marcy Panning, MD Medical/Oncology [facility] [phone] (beeper) [phone] (Office)  10/13/2013, 11:14 AM

## 2013-10-13 NOTE — Progress Notes (Signed)
Nutrition followup completed with patient during chemotherapy.  Patient reports she feels well.  She states she has had no side effects from her chemotherapy.  She states she eats all the time.  Weight has increased to 130 pounds March 16 from 128.5 pounds March 2.  Patient has no nutrition concerns today.  Nutrition diagnosis food and nutrition related knowledge deficit resolved.  Patient was educated and encouraged to contact me if she develops any nutrition related symptoms or questions.  No followup has been scheduled.

## 2013-10-14 ENCOUNTER — Other Ambulatory Visit: Payer: Self-pay

## 2013-10-14 ENCOUNTER — Ambulatory Visit (HOSPITAL_BASED_OUTPATIENT_CLINIC_OR_DEPARTMENT_OTHER): Payer: Medicare Other

## 2013-10-14 VITALS — BP 139/70 | HR 75 | Temp 98.1°F

## 2013-10-14 DIAGNOSIS — C50919 Malignant neoplasm of unspecified site of unspecified female breast: Secondary | ICD-10-CM

## 2013-10-14 DIAGNOSIS — C50912 Malignant neoplasm of unspecified site of left female breast: Principal | ICD-10-CM

## 2013-10-14 DIAGNOSIS — C773 Secondary and unspecified malignant neoplasm of axilla and upper limb lymph nodes: Secondary | ICD-10-CM

## 2013-10-14 DIAGNOSIS — Z5189 Encounter for other specified aftercare: Secondary | ICD-10-CM

## 2013-10-14 DIAGNOSIS — C50911 Malignant neoplasm of unspecified site of right female breast: Secondary | ICD-10-CM

## 2013-10-14 MED ORDER — PEGFILGRASTIM INJECTION 6 MG/0.6ML
6.0000 mg | Freq: Once | SUBCUTANEOUS | Status: AC
  Administered 2013-10-14: 6 mg via SUBCUTANEOUS
  Filled 2013-10-14: qty 0.6

## 2013-10-15 ENCOUNTER — Telehealth: Payer: Self-pay | Admitting: Oncology

## 2013-10-15 NOTE — Telephone Encounter (Signed)
, °

## 2013-10-18 ENCOUNTER — Other Ambulatory Visit: Payer: Self-pay | Admitting: Oncology

## 2013-10-20 ENCOUNTER — Ambulatory Visit (HOSPITAL_BASED_OUTPATIENT_CLINIC_OR_DEPARTMENT_OTHER): Payer: Self-pay | Admitting: Genetic Counselor

## 2013-10-20 ENCOUNTER — Ambulatory Visit: Payer: Medicare Other | Admitting: Adult Health

## 2013-10-20 ENCOUNTER — Telehealth: Payer: Self-pay | Admitting: Adult Health

## 2013-10-20 ENCOUNTER — Other Ambulatory Visit: Payer: Medicare Other

## 2013-10-20 ENCOUNTER — Ambulatory Visit: Payer: Medicare Other | Admitting: Oncology

## 2013-10-20 DIAGNOSIS — C50911 Malignant neoplasm of unspecified site of right female breast: Secondary | ICD-10-CM

## 2013-10-20 DIAGNOSIS — C50912 Malignant neoplasm of unspecified site of left female breast: Principal | ICD-10-CM

## 2013-10-20 DIAGNOSIS — C50919 Malignant neoplasm of unspecified site of unspecified female breast: Secondary | ICD-10-CM

## 2013-10-20 NOTE — Progress Notes (Signed)
The patient checked in for her appointment, and I went to greet her.  She indicated that she was not interested in testing.  I explained that her physician referred her because of her personal and family history of cancer and the concern that it could be a hereditary cancer syndrome.  She asked if it would need a shot.  I explained that if she decided to get tested, she would need a blood draw.  "no thank you.".  She shook my hand and said thank you.

## 2013-10-20 NOTE — Telephone Encounter (Signed)
, °

## 2013-10-21 ENCOUNTER — Encounter: Payer: Self-pay | Admitting: Oncology

## 2013-10-21 ENCOUNTER — Other Ambulatory Visit (HOSPITAL_BASED_OUTPATIENT_CLINIC_OR_DEPARTMENT_OTHER): Payer: Medicare Other

## 2013-10-21 ENCOUNTER — Ambulatory Visit (HOSPITAL_BASED_OUTPATIENT_CLINIC_OR_DEPARTMENT_OTHER): Payer: Medicare Other | Admitting: Adult Health

## 2013-10-21 ENCOUNTER — Other Ambulatory Visit: Payer: Self-pay | Admitting: Adult Health

## 2013-10-21 ENCOUNTER — Encounter: Payer: Self-pay | Admitting: Adult Health

## 2013-10-21 VITALS — BP 125/73 | HR 76 | Temp 98.4°F | Resp 18 | Ht 60.0 in | Wt 129.6 lb

## 2013-10-21 DIAGNOSIS — C50912 Malignant neoplasm of unspecified site of left female breast: Principal | ICD-10-CM

## 2013-10-21 DIAGNOSIS — C50919 Malignant neoplasm of unspecified site of unspecified female breast: Secondary | ICD-10-CM

## 2013-10-21 DIAGNOSIS — C50911 Malignant neoplasm of unspecified site of right female breast: Secondary | ICD-10-CM

## 2013-10-21 DIAGNOSIS — C773 Secondary and unspecified malignant neoplasm of axilla and upper limb lymph nodes: Secondary | ICD-10-CM

## 2013-10-21 DIAGNOSIS — E876 Hypokalemia: Secondary | ICD-10-CM

## 2013-10-21 DIAGNOSIS — G609 Hereditary and idiopathic neuropathy, unspecified: Secondary | ICD-10-CM

## 2013-10-21 DIAGNOSIS — Z170 Estrogen receptor positive status [ER+]: Secondary | ICD-10-CM

## 2013-10-21 LAB — COMPREHENSIVE METABOLIC PANEL (CC13)
ALBUMIN: 3.7 g/dL (ref 3.5–5.0)
ALT: 19 U/L (ref 0–55)
ANION GAP: 10 meq/L (ref 3–11)
AST: 17 U/L (ref 5–34)
Alkaline Phosphatase: 158 U/L — ABNORMAL HIGH (ref 40–150)
BUN: 7.9 mg/dL (ref 7.0–26.0)
CALCIUM: 9.7 mg/dL (ref 8.4–10.4)
CO2: 28 meq/L (ref 22–29)
CREATININE: 0.8 mg/dL (ref 0.6–1.1)
Chloride: 105 mEq/L (ref 98–109)
Glucose: 109 mg/dl (ref 70–140)
POTASSIUM: 3.2 meq/L — AB (ref 3.5–5.1)
SODIUM: 143 meq/L (ref 136–145)
TOTAL PROTEIN: 6.5 g/dL (ref 6.4–8.3)
Total Bilirubin: 0.36 mg/dL (ref 0.20–1.20)

## 2013-10-21 LAB — CBC WITH DIFFERENTIAL/PLATELET
BASO%: 0.5 % (ref 0.0–2.0)
Basophils Absolute: 0.1 10*3/uL (ref 0.0–0.1)
EOS%: 0.9 % (ref 0.0–7.0)
Eosinophils Absolute: 0.1 10*3/uL (ref 0.0–0.5)
HCT: 32.3 % — ABNORMAL LOW (ref 34.8–46.6)
HGB: 10.1 g/dL — ABNORMAL LOW (ref 11.6–15.9)
LYMPH%: 19 % (ref 14.0–49.7)
MCH: 21.7 pg — AB (ref 25.1–34.0)
MCHC: 31.4 g/dL — AB (ref 31.5–36.0)
MCV: 69.2 fL — ABNORMAL LOW (ref 79.5–101.0)
MONO#: 1 10*3/uL — ABNORMAL HIGH (ref 0.1–0.9)
MONO%: 9.5 % (ref 0.0–14.0)
NEUT#: 7.5 10*3/uL — ABNORMAL HIGH (ref 1.5–6.5)
NEUT%: 70.1 % (ref 38.4–76.8)
Platelets: 158 10*3/uL (ref 145–400)
RBC: 4.66 10*6/uL (ref 3.70–5.45)
RDW: 13.6 % (ref 11.2–14.5)
WBC: 10.7 10*3/uL — AB (ref 3.9–10.3)
lymph#: 2 10*3/uL (ref 0.9–3.3)

## 2013-10-21 MED ORDER — POTASSIUM CHLORIDE CRYS ER 20 MEQ PO TBCR: 20.0000 meq | EXTENDED_RELEASE_TABLET | Freq: Every day | ORAL | Status: DC

## 2013-10-21 NOTE — Progress Notes (Signed)
Hematology and Oncology Follow Up Visit  Paula Navarro [id number] 04/22/1951 63 y.o. 10/21/2013 11:19 AM  Principle Diagnosis: 63 y/o female with stage IIB ER positive bilateral breast cancer.     Prior Therapy:.  1.  Patient developed pain in her breasts and palpated masses. Because of this she went on to have further workup performed including mammograms and ultrasound. On 07/23/2013 patient had bilateral diagnostic mammogram she was noted to have bilateral breast masses on the right irregular mass lying posterior lateral breast associated with a large rounded and dense axillary nodes. The left showed slightly smaller irregular mass lying at the 11:00 to 12:00 position. There was a smaller adjacent cyst. Also noted to have a large dense axillary lymph nodes on the left. Because of this she was recommended ultrasound and biopsies. 08/07/2013 patient underwent ultrasound-guided right breast core needle biopsy will. As well as left biopsy. 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 The lymph node was positive for metastatic disease. On the left patient had a biopsy that also showed invasive ductal carcinoma grade 3 ER positive PR negative HER-2/neu negative with a proliferation marker Ki-67 80%.Her clinical and radiologic staging was T2 NX on the left side T2 N1 on the right side.   2. Patient was seen in the multidisciplinary breast clinic. Neoadjuvant treatment was recommended with chemotherapy consisting of FEC followed by Taxol. Patient underwent port placement, chemo education and her echocardiogram demonstrated a LVEF of 65-70%.  3. Patient began on neoadjuvant FEC dose dense starting on 2/16 /2015. A total of 6 cycles is planned. This will be followed by 12 weeks of Taxol   Current therapy:  FEC cycle 3 day 8  Interim History:  [name] is here today following cycle 3 of her  FEC chemotherapy for her bilateral invasive ductal carcinoma.  She is doing well following her third cycle of treatment.  She has burning with defecation and is applying ointment which is helping.  She is not neutropenic and denies fevers, chills, nausea, vomiting, constipation, diarrhea, chest pain.  She did develop numbness in her feet bilaterally that covers the entire sole of her feet.  She denies any balance difficulty.  She does tell me that she can't tell if her feet really are numb, or if her shoes are just too tight.  She denies any numbness in her fingertips.  She is taking Super b complex daily for this.  This did help the numbness in her feet.  She is beginning to experience hyperpigmentation in the lower portion of her fingernail bed.  She is concerned her nails will become brittle and come off.  They haven't yet.  She has tea tree oil and hasn't yet used it.  Otherwise, the patient is doing well and a 10 point ROS is neg.  Medications:  Current Outpatient Prescriptions  Medication Sig Dispense Refill  . dexamethasone (DECADRON) 4 MG tablet Take 4 mg by mouth 2 (two) times daily with a meal.      . lidocaine-prilocaine (EMLA) cream Apply 1 application topically as needed.      Marland Kitchen LORazepam (ATIVAN) 1 MG tablet Take 1 tablet (1 mg total) by mouth every 6 (six) hours as needed for anxiety.  30 tablet  1  . prochlorperazine (COMPAZINE) 10 MG tablet Take 1 tablet (10 mg total) by mouth every 6 (six) hours as needed (Nausea or vomiting).  30 tablet  1  . zolpidem (AMBIEN) 5 MG tablet Take 1 tablet (5 mg total) by mouth at bedtime as needed for sleep.  30 tablet  0   No current facility-administered medications for this visit.     Allergies:  Allergies  Allergen Reactions  . Codeine Other (See Comments)    Hot in chest    Past Medical History, Surgical history, Social history, and Family History were reviewed and updated.  Review of Systems: A 10 point review of systems was conducted  and is otherwise negative except for what is noted above.     Physical Exam: Blood pressure 125/73, pulse 76, temperature 98.4 F (36.9 C), temperature source Oral, resp. rate 18, height 5' (1.524 m), weight 129 lb 9.6 oz (58.786 kg), last menstrual period 05/20/2011. GENERAL: Patient is a well appearing female in no acute distress HEENT:  Sclerae anicteric.  Oropharynx clear and moist. No ulcerations or evidence of oropharyngeal candidiasis. Neck is supple.  NODES:  No cervical, supraclavicular, or axillary lymphadenopathy palpated.  BREAST EXAM:  Deferred. LUNGS:  Clear to auscultation bilaterally.  No wheezes or rhonchi. HEART:  Regular rate and rhythm. No murmur appreciated. ABDOMEN:  Soft, nontender.  Positive, normoactive bowel sounds. No organomegaly palpated. MSK:  No focal spinal tenderness to palpation. Full range of motion bilaterally in the upper extremities. EXTREMITIES:  No peripheral edema.   SKIN:  Clear with no obvious rashes or skin changes. No nail dyscrasia. NEURO:  Nonfocal. Well oriented.  Appropriate affect. ECOG: 1  Lab Results: Lab Results  Component Value Date   WBC 10.7* 10/21/2013   HGB 10.1* 10/21/2013   HCT 32.3* 10/21/2013   MCV 69.2* 10/21/2013   PLT 158 10/21/2013     Chemistry      Component Value Date/Time   NA 142 10/13/2013 0943   NA 141 09/23/2012 0414   K 3.7 10/13/2013 0943   K 3.8 09/23/2012 0414   CL 107 09/23/2012 0414   CO2 23 10/13/2013 0943   CO2 29 10/30/2011 1419   BUN 9.7 10/13/2013 0943   BUN 7 09/23/2012 0414   CREATININE 0.8 10/13/2013 0943   CREATININE 0.90 09/23/2012 0414      Component Value Date/Time   CALCIUM 9.5 10/13/2013 0943   CALCIUM 10.1 10/30/2011 1419   ALKPHOS 112 10/13/2013 0943   AST 25 10/13/2013 0943   ALT 25 10/13/2013 0943   BILITOT 0.48 10/13/2013 0943       Assessment and Plan:  Patient is a 63 year old female with   1. Stage II bilateral ER positive invasive ductal carcinoma:  She is currently undergoing  neoadjuvant chemotherapy, FEC given every 14 days with a  Total of 6 planned cycles.  She is currently cycle 3 day 8.  Her CBC is stable today.  I reviewed this with her in detail.  She was slightly confused about her chemotherapy regimen today.  I again reviewed it, the dates and timing with her in detail.    2. Neuropathy:  Patient will continue Super B complex daily, she states it has improved her neuropathy tremendously.     3. Nail dyscrasia:  Her nails have become darker at the base of the nail bed.  I recommended she go ahead and start using tea tree oil.    Patient will return in one week for labs, an office visit and cycle 4 of FEC chemotherapy.  She knows to contact our office if she needs anything in the interim.  I  spent 25 minutes counseling the patient face to face.  The total time spent in the appointment was 30 minutes.   Minette Headland, [place] [phone] 3/24/201511:19 AM

## 2013-10-21 NOTE — Progress Notes (Signed)
Patient approved for 600.00 [employer] fund and 400.00 [place]. I mailed letter to advise of onetime grant. All documents in

## 2013-10-22 ENCOUNTER — Other Ambulatory Visit: Payer: Self-pay | Admitting: Oncology

## 2013-10-22 ENCOUNTER — Telehealth: Payer: Self-pay | Admitting: *Deleted

## 2013-10-22 NOTE — Telephone Encounter (Signed)
Message copied by Harmon Pier on Wed Oct 22, 2013  9:27 AM ------      Message from: Minette Headland      Created: Tue Oct 21, 2013  3:54 PM       Please call patient about low potassium level and have her pick up the potassium I prescribed to her pharmacy.        Thanks, LC            ----- Message -----         From: Minette Headland, NP         Sent: 10/21/2013   3:52 PM           To: Minette Headland, NP                        ----- Message -----         From: [facility] Interface         Sent: 10/21/2013  11:07 AM           To: Deatra Robinson, MD                   ------

## 2013-10-22 NOTE — Telephone Encounter (Signed)
Ms. Salmons called reporting she is having tingling to top of both feet and abdominal swelling that started today.  Denies trouble walking.  No tingling to hands.  Reports she has been awake since 5:30 am and cooking.  "I stopped smoking and I love to eat and cook."  Lives with 61 y/o mom and they care for each other.  Talked with patient about keeping legs elevated and remaining seated to assist her mom in preparing meals.  Try gum, hard candies, fruits or vegetables she can grab without doing a lot of cooking.   Also asked "why am I not finished with chemotherapy?   When is my next appointment date and time?  What are the names of the new chemotherapy I am to begin?  Can I receive my injection the same day so I don't have to come back?  This is confusing and I am not going to take anymore chemotherapy if I can't get answers." 10-28-2013 at 1:30 pm lab/2:00 FEC appointment given.  Reviewed treatment plan and attempted to explain why she is to receive three more treatments to finish, why [place] can't be given same day as chemotherapy.  Pleasant discussion which she will come in next week and receive a new calendar.  Encouraged to keep this information posted to lessen confusion.  No further discussion about abdomen.  Called patient, no answer and she does not have voicemail set up yet.

## 2013-10-22 NOTE — Telephone Encounter (Signed)
This LPN has been trying to call pt since yesterday to inform her of potassium level(3.2L). Pt has a number that does not allow you to leave a message. I will continue to call pt until I reach her. Message to be forwarded to Charlestine Massed, NP.

## 2013-10-22 NOTE — Telephone Encounter (Signed)
Per 3/24 POF I have scheduled appts. Patietn aware of appts. I have mailed calendar

## 2013-10-23 ENCOUNTER — Telehealth: Payer: Self-pay

## 2013-10-23 NOTE — Telephone Encounter (Signed)
Pt called wanting to know about potassium scrip.  She found out at pharmacy.  Let her know we could not get hold of her yesterday and her phone would not let us leave a message.  Gave her potassium results and that she needed to take the prescription.  Pt asked about continuing Vit B.  Advised her that she should take all prescriptions given to her by Dr Humphrey Rolls until they are discontinued.  She indicated she may stop chemo.  I let her know that would be a discussion to have with Dr. Humphrey Rolls or Lisabeth Register and that her next appointment was 3/31.  Pt voiced understanding.

## 2013-10-27 ENCOUNTER — Telehealth: Payer: Self-pay | Admitting: *Deleted

## 2013-10-27 NOTE — Progress Notes (Signed)
Hematology and Oncology Follow Up Visit  Paula Navarro [id number] [birth year] 63 y.o. 10/30/2013 10:16 AM     Principle Diagnosis:Paula Navarro 63 y.o. female with ER positive bilateral breast cancer.   Prior Therapy:  1. Patient developed pain in her breasts and palpated masses. Because of this she went on to have further workup performed including mammograms and ultrasound. On 07/23/2013 patient had bilateral diagnostic mammogram she was noted to have bilateral breast masses on the right irregular mass lying posterior lateral breast associated with a large rounded and dense axillary nodes. The left showed slightly smaller irregular mass lying at the 11:00 to 12:00 position. There was a smaller adjacent cyst. Also noted to have a large dense axillary lymph nodes on the left. Because of this she was recommended ultrasound and biopsies. 08/07/2013 patient underwent ultrasound-guided right breast core needle biopsy will. As well as left biopsy. 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 The lymph node was positive for metastatic disease. On the left patient had a biopsy that also showed invasive ductal carcinoma grade 3 ER positive PR negative HER-2/neu negative with a proliferation marker Ki-67 80%.Her clinical and radiologic staging was T2 NX on the left side T2 N1 on the right side.   2. Patient was seen in the multidisciplinary breast clinic. Neoadjuvant treatment was recommended with chemotherapy consisting of FEC followed by Taxol. Patient underwent port placement, chemo education and her echocardiogram demonstrated a LVEF of 65-70%.   3. Patient began on neoadjuvant FEC dose dense starting on 2/16 /2015. A total of 6 cycles is planned. This will be followed by 12 weeks of Taxol  Current therapy: FEC cycle 4 day 1  Interim History: Paula Navarro 63 y.o. female with ER positive  bilateral breast cancer here for evaluation prior to her neoadjuvant FEC chemotherapy.  She is doing well today.  She had misunderstood her chemotherapy plans and is requesting details written about her regimens and journey plan.  She is feeling well today.  She denies any further numbness in her fingertips and toes and is taking Super B complex daily.  Her nails are beginning to change color at her nail bed.  Her potassium was decreased last week and she was prescribed Kdur.  She has been taking this medication as prescribed and has almost completed her prescription.  She did have sexual intercourse yesterday and noticed vaginal bleeding afterwards.  It has since stopped.  She has tolerated it relatively well.  Otherwise, she denies fevers, chills, nausea, vomiting, constipation, diarrhea, numbness, mucositis, fatigue or any other concerns.    Medications:  Current Outpatient Prescriptions  Medication Sig Dispense Refill  . B Complex-C (SUPER B COMPLEX PO) Take 1 tablet by mouth daily.      Marland Kitchen dexamethasone (DECADRON) 4 MG tablet Take 4 mg by mouth 2 (two) times daily with a meal.      . lidocaine-prilocaine (EMLA) cream Apply 1 application topically as needed.      . potassium chloride SA (K-DUR,KLOR-CON) 20 MEQ tablet Take 1 tablet (20 mEq total) by mouth daily.  7 tablet  0  . LORazepam (ATIVAN) 1 MG tablet Take 1 tablet (1 mg total) by mouth every 6 (six) hours as needed for anxiety.  30 tablet  1  . prochlorperazine (COMPAZINE) 10 MG tablet Take 1 tablet (10 mg total) by mouth every 6 (six) hours as needed (Nausea or  vomiting).  30 tablet  1  . zolpidem (AMBIEN) 5 MG tablet Take 1 tablet (5 mg total) by mouth at bedtime as needed for sleep.  30 tablet  0   Current Facility-Administered Medications  Medication Dose Route Frequency Provider Last Rate Last Dose  . sodium chloride 0.9 % injection 10 mL  10 mL Intravenous PRN Deatra Robinson, MD   10 mL at 10/28/13 1031     Allergies:  Allergies   Allergen Reactions  . Codeine Other (See Comments)    Hot in chest    Medical History: Past Medical History  Diagnosis Date  . Chronic bronchitis   . Palpitation     Tachycardia reported by monitor clerk during a symptomatic spell  . Chest pain     Admitted to [facility] in 09/2011; refused stress test  . Atrial septal defect 1996    Surgical repair in 1996  . Tobacco abuse     60 pack years; 1.5 packs per day  . Anxiety   . Anemia   . Breast cancer     Surgical History:  Past Surgical History  Procedure Laterality Date  . Cholecystectomy    . Cesarean section    . Vein bypass surgery    . Breast biopsy Bilateral      Review of Systems: A 10 point review of systems was conducted and is otherwise negative except for what is noted above.     Physical Exam: Blood pressure 151/81, pulse 81, temperature 98.9 F (37.2 C), temperature source Oral, resp. rate 18, height 5' (1.524 m), weight 132 lb 4.8 oz (60.011 kg), last menstrual period 05/20/2011. GENERAL: Patient is a well appearing female in no acute distress HEENT:  Sclerae anicteric.  Oropharynx clear and moist. No ulcerations or evidence of oropharyngeal candidiasis. Neck is supple.  NODES:  No cervical, supraclavicular, or axillary lymphadenopathy palpated.  BREAST EXAM:  Deferred. LUNGS:  Clear to auscultation bilaterally.  No wheezes or rhonchi. HEART:  Regular rate and rhythm. No murmur appreciated. ABDOMEN:  Soft, nontender.  Positive, normoactive bowel sounds. No organomegaly palpated. MSK:  No focal spinal tenderness to palpation. Full range of motion bilaterally in the upper extremities. EXTREMITIES:  No peripheral edema.   SKIN:  Clear with no obvious rashes or skin changes. Mild darkening at the base of the nail bed.  Nails do not appear to be brittle. NEURO:  Nonfocal. Well oriented.  Appropriate affect. ECOG PERFORMANCE STATUS: 1 - Symptomatic but completely ambulatory   Lab Results: Lab Results  Component  Value Date   WBC 28.4* 10/28/2013   HGB 9.4* 10/28/2013   HCT 29.3* 10/28/2013   MCV 68.8* 10/28/2013   PLT 204 10/28/2013     Chemistry      Component Value Date/Time   NA 143 10/28/2013 0951   NA 141 09/23/2012 0414   K 4.1 10/28/2013 0951   K 3.8 09/23/2012 0414   CL 107 09/23/2012 0414   CO2 25 10/28/2013 0951   CO2 29 10/30/2011 1419   BUN 8.5 10/28/2013 0951   BUN 7 09/23/2012 0414   CREATININE 0.7 10/28/2013 0951   CREATININE 0.90 09/23/2012 0414      Component Value Date/Time   CALCIUM 9.3 10/28/2013 0951   CALCIUM 10.1 10/30/2011 1419   ALKPHOS 93 10/28/2013 0951   AST 20 10/28/2013 0951   ALT 17 10/28/2013 0951   BILITOT 0.47 10/28/2013 0951      Assessment and Plan: Paula Navarro 63 y.o.  female  1. Stage II bilateral ER positive invasive ductal carcinoma.  Patient is undergoing neoadjuvant FEC chemotherapy.  She is currently here to receive cycle 4 day 1.  Her CBC is stable.  She will proceed with chemo today.    2. Neuropathy.  This has resolved with Super B Complex that she is taking daily.  I recommended she continue this.    3. Nail dyscrasia. Her nails are darkened and slightly brittle.  She is soaking them in tea tree oil.    4. Hypokalemia.  The patient has taken the potassium prescribed last week.  Her CMP is pending.  I will f/u with her about her potassium results later today.    5. Microcytic anemia.  I will order iron studies for next week.  6. Vaginal bleeding. I recommended she f/u with gynecology ASAP, particularly if it continues.  This was post-coital bleeding.  The patient will return tomorrow for Neulasta and in one week for labs and evaluation for chemotoxicities.    I spent 25 minutes counseling the patient face to face.  The total time spent in the appointment was 30 minutes.  Minette Headland, [place] [phone] 10/30/2013 10:16 AM

## 2013-10-27 NOTE — Telephone Encounter (Signed)
Patient called and moved her appts from the afternoon to the morning.

## 2013-10-28 ENCOUNTER — Encounter: Payer: Self-pay | Admitting: Adult Health

## 2013-10-28 ENCOUNTER — Telehealth: Payer: Self-pay | Admitting: *Deleted

## 2013-10-28 ENCOUNTER — Ambulatory Visit (HOSPITAL_BASED_OUTPATIENT_CLINIC_OR_DEPARTMENT_OTHER): Payer: Medicare Other | Admitting: Adult Health

## 2013-10-28 ENCOUNTER — Other Ambulatory Visit: Payer: Medicare Other

## 2013-10-28 ENCOUNTER — Ambulatory Visit (HOSPITAL_BASED_OUTPATIENT_CLINIC_OR_DEPARTMENT_OTHER): Payer: Medicare Other

## 2013-10-28 ENCOUNTER — Ambulatory Visit (HOSPITAL_BASED_OUTPATIENT_CLINIC_OR_DEPARTMENT_OTHER): Payer: Medicare Other | Admitting: *Deleted

## 2013-10-28 VITALS — BP 151/81 | HR 81 | Temp 98.9°F | Resp 18 | Ht 60.0 in | Wt 132.3 lb

## 2013-10-28 DIAGNOSIS — N939 Abnormal uterine and vaginal bleeding, unspecified: Secondary | ICD-10-CM

## 2013-10-28 DIAGNOSIS — C50919 Malignant neoplasm of unspecified site of unspecified female breast: Secondary | ICD-10-CM

## 2013-10-28 DIAGNOSIS — D509 Iron deficiency anemia, unspecified: Secondary | ICD-10-CM

## 2013-10-28 DIAGNOSIS — C773 Secondary and unspecified malignant neoplasm of axilla and upper limb lymph nodes: Secondary | ICD-10-CM

## 2013-10-28 DIAGNOSIS — C50911 Malignant neoplasm of unspecified site of right female breast: Secondary | ICD-10-CM

## 2013-10-28 DIAGNOSIS — E876 Hypokalemia: Secondary | ICD-10-CM

## 2013-10-28 DIAGNOSIS — Z170 Estrogen receptor positive status [ER+]: Secondary | ICD-10-CM

## 2013-10-28 DIAGNOSIS — Z5111 Encounter for antineoplastic chemotherapy: Secondary | ICD-10-CM

## 2013-10-28 DIAGNOSIS — C50912 Malignant neoplasm of unspecified site of left female breast: Principal | ICD-10-CM

## 2013-10-28 DIAGNOSIS — N926 Irregular menstruation, unspecified: Secondary | ICD-10-CM

## 2013-10-28 LAB — COMPREHENSIVE METABOLIC PANEL (CC13)
ALBUMIN: 3.6 g/dL (ref 3.5–5.0)
ALT: 17 U/L (ref 0–55)
AST: 20 U/L (ref 5–34)
Alkaline Phosphatase: 93 U/L (ref 40–150)
Anion Gap: 9 mEq/L (ref 3–11)
BUN: 8.5 mg/dL (ref 7.0–26.0)
CO2: 25 mEq/L (ref 22–29)
CREATININE: 0.7 mg/dL (ref 0.6–1.1)
Calcium: 9.3 mg/dL (ref 8.4–10.4)
Chloride: 109 mEq/L (ref 98–109)
Glucose: 106 mg/dl (ref 70–140)
POTASSIUM: 4.1 meq/L (ref 3.5–5.1)
Sodium: 143 mEq/L (ref 136–145)
Total Bilirubin: 0.47 mg/dL (ref 0.20–1.20)
Total Protein: 6.4 g/dL (ref 6.4–8.3)

## 2013-10-28 LAB — CBC WITH DIFFERENTIAL/PLATELET
BASO%: 1.4 % (ref 0.0–2.0)
Basophils Absolute: 0.4 10*3/uL — ABNORMAL HIGH (ref 0.0–0.1)
EOS%: 0.7 % (ref 0.0–7.0)
Eosinophils Absolute: 0.2 10*3/uL (ref 0.0–0.5)
HCT: 29.3 % — ABNORMAL LOW (ref 34.8–46.6)
HEMOGLOBIN: 9.4 g/dL — AB (ref 11.6–15.9)
LYMPH#: 2.8 10*3/uL (ref 0.9–3.3)
LYMPH%: 9.9 % — ABNORMAL LOW (ref 14.0–49.7)
MCH: 22.1 pg — AB (ref 25.1–34.0)
MCHC: 32.1 g/dL (ref 31.5–36.0)
MCV: 68.8 fL — AB (ref 79.5–101.0)
MONO#: 1.5 10*3/uL — ABNORMAL HIGH (ref 0.1–0.9)
MONO%: 5.2 % (ref 0.0–14.0)
NEUT#: 23.6 10*3/uL — ABNORMAL HIGH (ref 1.5–6.5)
NEUT%: 82.8 % — ABNORMAL HIGH (ref 38.4–76.8)
Platelets: 204 10*3/uL (ref 145–400)
RBC: 4.26 10*6/uL (ref 3.70–5.45)
RDW: 14.8 % — AB (ref 11.2–14.5)
WBC: 28.4 10*3/uL — ABNORMAL HIGH (ref 3.9–10.3)

## 2013-10-28 MED ORDER — SODIUM CHLORIDE 0.9 % IJ SOLN
10.0000 mL | INTRAMUSCULAR | Status: DC | PRN
  Administered 2013-10-28: 10 mL
  Filled 2013-10-28: qty 10

## 2013-10-28 MED ORDER — SODIUM CHLORIDE 0.9 % IV SOLN
Freq: Once | INTRAVENOUS | Status: AC
  Administered 2013-10-28: 12:00:00 via INTRAVENOUS

## 2013-10-28 MED ORDER — HEPARIN SOD (PORK) LOCK FLUSH 100 UNIT/ML IV SOLN
500.0000 [IU] | Freq: Once | INTRAVENOUS | Status: AC
  Administered 2013-10-28: 500 [IU] via INTRAVENOUS
  Filled 2013-10-28: qty 5

## 2013-10-28 MED ORDER — PALONOSETRON HCL INJECTION 0.25 MG/5ML
0.2500 mg | Freq: Once | INTRAVENOUS | Status: AC
  Administered 2013-10-28: 0.25 mg via INTRAVENOUS

## 2013-10-28 MED ORDER — SODIUM CHLORIDE 0.9 % IJ SOLN
10.0000 mL | INTRAMUSCULAR | Status: DC | PRN
  Administered 2013-10-28: 10 mL via INTRAVENOUS
  Filled 2013-10-28: qty 10

## 2013-10-28 MED ORDER — SODIUM CHLORIDE 0.9 % IV SOLN
500.0000 mg/m2 | Freq: Once | INTRAVENOUS | Status: AC
  Administered 2013-10-28: 800 mg via INTRAVENOUS
  Filled 2013-10-28: qty 40

## 2013-10-28 MED ORDER — DEXAMETHASONE SODIUM PHOSPHATE 20 MG/5ML IJ SOLN
INTRAMUSCULAR | Status: AC
  Filled 2013-10-28: qty 5

## 2013-10-28 MED ORDER — HEPARIN SOD (PORK) LOCK FLUSH 100 UNIT/ML IV SOLN
500.0000 [IU] | Freq: Once | INTRAVENOUS | Status: DC | PRN
  Filled 2013-10-28: qty 5

## 2013-10-28 MED ORDER — SODIUM CHLORIDE 0.9 % IV SOLN
150.0000 mg | Freq: Once | INTRAVENOUS | Status: AC
  Administered 2013-10-28: 150 mg via INTRAVENOUS
  Filled 2013-10-28: qty 5

## 2013-10-28 MED ORDER — EPIRUBICIN HCL CHEMO IV INJECTION 200 MG/100ML
100.0000 mg/m2 | Freq: Once | INTRAVENOUS | Status: AC
  Administered 2013-10-28: 158 mg via INTRAVENOUS
  Filled 2013-10-28: qty 79

## 2013-10-28 MED ORDER — PALONOSETRON HCL INJECTION 0.25 MG/5ML
INTRAVENOUS | Status: AC
  Filled 2013-10-28: qty 5

## 2013-10-28 MED ORDER — DEXAMETHASONE SODIUM PHOSPHATE 20 MG/5ML IJ SOLN
12.0000 mg | Freq: Once | INTRAMUSCULAR | Status: AC
  Administered 2013-10-28: 12 mg via INTRAVENOUS

## 2013-10-28 MED ORDER — FLUOROURACIL CHEMO INJECTION 2.5 GM/50ML
500.0000 mg/m2 | Freq: Once | INTRAVENOUS | Status: AC
  Administered 2013-10-28: 800 mg via INTRAVENOUS
  Filled 2013-10-28: qty 16

## 2013-10-28 NOTE — Telephone Encounter (Signed)
Called pt to inform her that potassium level is improved. No answer and can't leave a message because VM is not set up on phone. I will continue to try to call pt until I am able to talk with her. Message to be forwarded to Charlestine Massed, NP.

## 2013-10-28 NOTE — Patient Instructions (Signed)
Continue taking Super B complex every day.  Continue tea tree oil soaks for your nails.  Follow up with gynecology.

## 2013-10-28 NOTE — Patient Instructions (Signed)
[facility] Discharge Instructions for Patients Receiving Chemotherapy  Today you received the following chemotherapy agents 5 FU/[place]/Cytoxan To help prevent nausea and vomiting after your treatment, we encourage you to take your nausea medication as prescribed.  If you develop nausea and vomiting that is not controlled by your nausea medication, call the clinic.   BELOW ARE SYMPTOMS THAT SHOULD BE REPORTED IMMEDIATELY:  *FEVER GREATER THAN 100.5 F  *CHILLS WITH OR WITHOUT FEVER  NAUSEA AND VOMITING THAT IS NOT CONTROLLED WITH YOUR NAUSEA MEDICATION  *UNUSUAL SHORTNESS OF BREATH  *UNUSUAL BRUISING OR BLEEDING  TENDERNESS IN MOUTH AND THROAT WITH OR WITHOUT PRESENCE OF ULCERS  *URINARY PROBLEMS  *BOWEL PROBLEMS  UNUSUAL RASH Items with * indicate a potential emergency and should be followed up as soon as possible.  Feel free to call the clinic you have any questions or concerns. The clinic phone number is [phone].

## 2013-10-28 NOTE — Telephone Encounter (Signed)
Message copied by Harmon Pier on Tue Oct 28, 2013  4:15 PM ------      Message from: Minette Headland      Created: Tue Oct 28, 2013  3:55 PM       Please tell patient that her potassium is improved and that she doesn't need to take it anymore.         ----- Message -----         From: [facility] Interface         Sent: 10/28/2013  10:00 AM           To: Deatra Robinson, MD                   ------

## 2013-10-29 ENCOUNTER — Ambulatory Visit (HOSPITAL_BASED_OUTPATIENT_CLINIC_OR_DEPARTMENT_OTHER): Payer: Medicare Other

## 2013-10-29 VITALS — BP 140/60 | HR 86 | Temp 98.3°F

## 2013-10-29 DIAGNOSIS — C50919 Malignant neoplasm of unspecified site of unspecified female breast: Secondary | ICD-10-CM

## 2013-10-29 DIAGNOSIS — Z5189 Encounter for other specified aftercare: Secondary | ICD-10-CM

## 2013-10-29 DIAGNOSIS — C50911 Malignant neoplasm of unspecified site of right female breast: Secondary | ICD-10-CM

## 2013-10-29 DIAGNOSIS — C50912 Malignant neoplasm of unspecified site of left female breast: Principal | ICD-10-CM

## 2013-10-29 DIAGNOSIS — C773 Secondary and unspecified malignant neoplasm of axilla and upper limb lymph nodes: Secondary | ICD-10-CM

## 2013-10-29 MED ORDER — PEGFILGRASTIM INJECTION 6 MG/0.6ML
6.0000 mg | Freq: Once | SUBCUTANEOUS | Status: AC
  Administered 2013-10-29: 6 mg via SUBCUTANEOUS
  Filled 2013-10-29: qty 0.6

## 2013-11-02 NOTE — Progress Notes (Signed)
Hematology and Oncology Follow Up Visit  Paula Navarro [id number] 03/29/1951 63 y.o. 11/04/2013 3:32 PM     Principle Diagnosis:Paula Navarro Flatten 63 y.o. female with ER positive bilateral breast cancer.  Prior Therapy:  1. Patient developed pain in her breasts and palpated masses. Because of this she went on to have further workup performed including mammograms and ultrasound. On 07/23/2013 patient had bilateral diagnostic mammogram she was noted to have bilateral breast masses on the right irregular mass lying posterior lateral breast associated with a large rounded and dense axillary nodes. The left showed slightly smaller irregular mass lying at the 11:00 to 12:00 position. There was a smaller adjacent cyst. Also noted to have a large dense axillary lymph nodes on the left. Because of this she was recommended ultrasound and biopsies. 08/07/2013 patient underwent ultrasound-guided right breast core needle biopsy will. As well as left biopsy. 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 The lymph node was positive for metastatic disease. On the left patient had a biopsy that also showed invasive ductal carcinoma grade 3 ER positive PR negative HER-2/neu negative with a proliferation marker Ki-67 80%.Her clinical and radiologic staging was T2 NX on the left side T2 N1 on the right side.   2. Patient was seen in the multidisciplinary breast clinic. Neoadjuvant treatment was recommended with chemotherapy consisting of FEC followed by Taxol. Patient underwent port placement, chemo education and her echocardiogram demonstrated a LVEF of 65-70%.   3. Patient began on neoadjuvant FEC dose dense starting on 2/16 /2015. A total of 6 cycles is planned. This will be followed by 12 weeks of Taxol   Current therapy:  FEC cycle 4 day 8  Interim History: Paula Navarro 63 y.o. female with ER positive  bilateral breast cancer. She is doing very well today. She is on day 8 after receiving her neoadjuvant FEC chemotherapy.  She receives this treatment on day 1 of a 14 day cycle.  She receives Neulasta on day 2.  She has had no further vaginal bleeding.  She denies fevers, chills, nausea, vomiting, constipation, diarrhea, numbness, mouth pain, or any further concerns.    Medications:  Current Outpatient Prescriptions  Medication Sig Dispense Refill  . B Complex-C (SUPER B COMPLEX PO) Take 1 tablet by mouth daily.      Marland Kitchen dexamethasone (DECADRON) 4 MG tablet Take 4 mg by mouth 2 (two) times daily with a meal.      . lidocaine-prilocaine (EMLA) cream Apply 1 application topically as needed.      Marland Kitchen LORazepam (ATIVAN) 1 MG tablet Take 1 tablet (1 mg total) by mouth every 6 (six) hours as needed for anxiety.  30 tablet  1  . potassium chloride SA (K-DUR,KLOR-CON) 20 MEQ tablet Take 1 tablet (20 mEq total) by mouth daily.  7 tablet  0  . prochlorperazine (COMPAZINE) 10 MG tablet Take 1 tablet (10 mg total) by mouth every 6 (six) hours as needed (Nausea or vomiting).  30 tablet  1  . zolpidem (AMBIEN) 5 MG tablet Take 1 tablet (5 mg total) by mouth at bedtime as needed for sleep.  30 tablet  0   No current facility-administered medications for this visit.     Allergies:  Allergies  Allergen Reactions  . Codeine Other (See Comments)    Hot in chest    Medical History: Past Medical History  Diagnosis Date  .  Chronic bronchitis   . Palpitation     Tachycardia reported by monitor clerk during a symptomatic spell  . Chest pain     Admitted to [facility] in 09/2011; refused stress test  . Atrial septal defect 1996    Surgical repair in 1996  . Tobacco abuse     60 pack years; 1.5 packs per day  . Anxiety   . Anemia   . Breast cancer     Surgical History:  Past Surgical History  Procedure Laterality Date  . Cholecystectomy    . Cesarean section    . Vein bypass surgery    . Breast biopsy  Bilateral      Review of Systems: A 10 point review of systems was conducted and is otherwise negative except for what is noted above.     Physical Exam: Blood pressure 133/75, pulse 86, temperature 98.7 F (37.1 C), temperature source Oral, resp. rate 20, height 5' (1.524 m), weight 126 lb 12.8 oz (57.516 kg), last menstrual period 05/20/2011. GENERAL: Patient is a well appearing female in no acute distress HEENT:  Sclerae anicteric.  Oropharynx clear and moist. No ulcerations or evidence of oropharyngeal candidiasis. Neck is supple.  NODES:  No cervical, supraclavicular, or axillary lymphadenopathy palpated.  BREAST EXAM:  Deferred. LUNGS:  Clear to auscultation bilaterally.  No wheezes or rhonchi. HEART:  Regular rate and rhythm. No murmur appreciated. ABDOMEN:  Soft, nontender.  Positive, normoactive bowel sounds. No organomegaly palpated. MSK:  No focal spinal tenderness to palpation. Full range of motion bilaterally in the upper extremities. EXTREMITIES:  No peripheral edema.   SKIN:  Clear with no obvious rashes or skin changes. Mild darkening at the base of the nail bed.   NEURO:  Nonfocal. Well oriented.  Appropriate affect. ECOG PERFORMANCE STATUS: 1 - Symptomatic but completely ambulatory   Lab Results: Lab Results  Component Value Date   WBC 7.3 11/03/2013   HGB 9.2* 11/03/2013   HCT 28.3* 11/03/2013   MCV 67.7* 11/03/2013   PLT 186 11/03/2013     Chemistry      Component Value Date/Time   NA 142 11/03/2013 1238   NA 141 09/23/2012 0414   K 3.8 11/03/2013 1238   K 3.8 09/23/2012 0414   CL 107 09/23/2012 0414   CO2 23 11/03/2013 1238   CO2 29 10/30/2011 1419   BUN 9.7 11/03/2013 1238   BUN 7 09/23/2012 0414   CREATININE 0.7 11/03/2013 1238   CREATININE 0.90 09/23/2012 0414      Component Value Date/Time   CALCIUM 9.3 11/03/2013 1238   CALCIUM 10.1 10/30/2011 1419   ALKPHOS 150 11/03/2013 1238   AST 16 11/03/2013 1238   ALT 14 11/03/2013 1238   BILITOT 0.66 11/03/2013 1238        Assessment and Plan: Paula Navarro 63 y.o. female With  1. ER positive bilateral breast cancer.  She is currently undergoing neoadjuvant chemotherapy with FEC.  A total of 6 cycles of this therapy is planned and that is to be followed by 12 cycles of weekly Taxol. She is currently cycle 4 day 8.  Her labs are stable today.  I reviewed them with her in detail.   2. Neuropathy:  This has resolved.  I have recommended the patient continue to take Super B complex daily.    3. Nail dyscrasia.  The nails are hyperpigmented at the bases.  She will continue to soak them in tea tree oil.    4.  Microcytic anemia:  Iron studies were drawn and are pending.    The patient will return in one week for labs, evaluation and cycle 5 of FEC chemotherapy.  She knows to call us for any questions or concerns.  We can certainly see her sooner if needed.   I spent 25 minutes counseling the patient face to face.  The total time spent in the appointment was 30 minutes.  Minette Headland, [place] [phone] 11/04/2013 3:32 PM

## 2013-11-03 ENCOUNTER — Telehealth: Payer: Self-pay | Admitting: Adult Health

## 2013-11-03 ENCOUNTER — Ambulatory Visit: Payer: Medicare Other

## 2013-11-03 ENCOUNTER — Other Ambulatory Visit (HOSPITAL_BASED_OUTPATIENT_CLINIC_OR_DEPARTMENT_OTHER): Payer: Medicare Other

## 2013-11-03 ENCOUNTER — Ambulatory Visit (HOSPITAL_BASED_OUTPATIENT_CLINIC_OR_DEPARTMENT_OTHER): Payer: Medicare Other | Admitting: Adult Health

## 2013-11-03 ENCOUNTER — Encounter: Payer: Self-pay | Admitting: Adult Health

## 2013-11-03 VITALS — BP 133/75 | HR 86 | Temp 98.7°F | Resp 20 | Ht 60.0 in | Wt 126.8 lb

## 2013-11-03 DIAGNOSIS — C50919 Malignant neoplasm of unspecified site of unspecified female breast: Secondary | ICD-10-CM

## 2013-11-03 DIAGNOSIS — C50911 Malignant neoplasm of unspecified site of right female breast: Secondary | ICD-10-CM

## 2013-11-03 DIAGNOSIS — C773 Secondary and unspecified malignant neoplasm of axilla and upper limb lymph nodes: Secondary | ICD-10-CM

## 2013-11-03 DIAGNOSIS — L609 Nail disorder, unspecified: Secondary | ICD-10-CM

## 2013-11-03 DIAGNOSIS — C50912 Malignant neoplasm of unspecified site of left female breast: Principal | ICD-10-CM

## 2013-11-03 DIAGNOSIS — Z170 Estrogen receptor positive status [ER+]: Secondary | ICD-10-CM

## 2013-11-03 DIAGNOSIS — D509 Iron deficiency anemia, unspecified: Secondary | ICD-10-CM

## 2013-11-03 LAB — CBC WITH DIFFERENTIAL/PLATELET
BASO%: 0.5 % (ref 0.0–2.0)
Basophils Absolute: 0 10*3/uL (ref 0.0–0.1)
EOS%: 3.2 % (ref 0.0–7.0)
Eosinophils Absolute: 0.2 10*3/uL (ref 0.0–0.5)
HEMATOCRIT: 28.3 % — AB (ref 34.8–46.6)
HGB: 9.2 g/dL — ABNORMAL LOW (ref 11.6–15.9)
LYMPH%: 19.6 % (ref 14.0–49.7)
MCH: 22 pg — ABNORMAL LOW (ref 25.1–34.0)
MCHC: 32.5 g/dL (ref 31.5–36.0)
MCV: 67.7 fL — AB (ref 79.5–101.0)
MONO#: 0.2 10*3/uL (ref 0.1–0.9)
MONO%: 2.9 % (ref 0.0–14.0)
NEUT#: 5.4 10*3/uL (ref 1.5–6.5)
NEUT%: 73.8 % (ref 38.4–76.8)
NRBC: 0 % (ref 0–0)
PLATELETS: 186 10*3/uL (ref 145–400)
RBC: 4.18 10*6/uL (ref 3.70–5.45)
RDW: 15 % — ABNORMAL HIGH (ref 11.2–14.5)
WBC: 7.3 10*3/uL (ref 3.9–10.3)
lymph#: 1.4 10*3/uL (ref 0.9–3.3)

## 2013-11-03 LAB — COMPREHENSIVE METABOLIC PANEL (CC13)
ALT: 14 U/L (ref 0–55)
AST: 16 U/L (ref 5–34)
Albumin: 3.6 g/dL (ref 3.5–5.0)
Alkaline Phosphatase: 150 U/L (ref 40–150)
Anion Gap: 12 mEq/L — ABNORMAL HIGH (ref 3–11)
BUN: 9.7 mg/dL (ref 7.0–26.0)
CALCIUM: 9.3 mg/dL (ref 8.4–10.4)
CHLORIDE: 106 meq/L (ref 98–109)
CO2: 23 mEq/L (ref 22–29)
CREATININE: 0.7 mg/dL (ref 0.6–1.1)
GLUCOSE: 111 mg/dL (ref 70–140)
Potassium: 3.8 mEq/L (ref 3.5–5.1)
Sodium: 142 mEq/L (ref 136–145)
Total Bilirubin: 0.66 mg/dL (ref 0.20–1.20)
Total Protein: 6.2 g/dL — ABNORMAL LOW (ref 6.4–8.3)

## 2013-11-03 MED ORDER — LORAZEPAM 1 MG PO TABS: 1.0000 mg | ORAL_TABLET | Freq: Four times a day (QID) | ORAL | Status: DC | PRN

## 2013-11-03 NOTE — Telephone Encounter (Signed)
, °

## 2013-11-04 ENCOUNTER — Encounter: Payer: Self-pay | Admitting: Oncology

## 2013-11-04 LAB — IRON AND TIBC CHCC
%SAT: 98 % — ABNORMAL HIGH (ref 21–57)
Iron: 240 ug/dL — ABNORMAL HIGH (ref 41–142)
TIBC: 245 ug/dL (ref 236–444)
UIBC: 5 ug/dL — AB (ref 120–384)

## 2013-11-04 LAB — FERRITIN CHCC: Ferritin: 1231 ng/ml — ABNORMAL HIGH (ref 9–269)

## 2013-11-04 NOTE — Progress Notes (Signed)
Called [employer] and per Otila Kluver, the patient's name is still no on acct, so we can't pay until done. I called [phone] and can't leave a message for her. She needs her name on it for [place] to pay. [employer] is past due but will make commitment pmt once done. [phone] will be paid.

## 2013-11-10 NOTE — Progress Notes (Addendum)
Hematology and Oncology Follow Up Visit  Paula Navarro [id number] [birth year] 63 y.o. 11/12/2013 9:01 AM     Principle Diagnosis:Paula Navarro 63 y.o. female with ER positive bilateral breast cancer.    Prior Therapy: 1. Patient developed pain in her breasts and palpated masses. Because of this she went on to have further workup performed including mammograms and ultrasound. On 07/23/2013 patient had bilateral diagnostic mammogram she was noted to have bilateral breast masses on the right irregular mass lying posterior lateral breast associated with a large rounded and dense axillary nodes. The left showed slightly smaller irregular mass lying at the 11:00 to 12:00 position. There was a smaller adjacent cyst. Also noted to have a large dense axillary lymph nodes on the left. Because of this she was recommended ultrasound and biopsies. 08/07/2013 patient underwent ultrasound-guided right breast core needle biopsy will. As well as left biopsy. 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 The lymph node was positive for metastatic disease. On the left patient had a biopsy that also showed invasive ductal carcinoma grade 3 ER positive PR negative HER-2/neu negative with a proliferation marker Ki-67 80%.Her clinical and radiologic staging was T2 NX on the left side T2 N1 on the right side.   2. Patient was seen in the multidisciplinary breast clinic. Neoadjuvant treatment was recommended with chemotherapy consisting of FEC followed by Taxol. Patient underwent port placement, chemo education and her echocardiogram demonstrated a LVEF of 65-70%.   3. Patient began on neoadjuvant FEC dose dense starting on 2/16 /2015. A total of 6 cycles is planned. This will be followed by 12 weeks of Taxol   Current therapy:  FEC cycle 5 day 1  Interim History: Paula Navarro 63 y.o. female with ER positive  bilateral breast cancer.  She is here for evaluation prior to receiving her fifth cycle of neoadjuvant chemotherapy with FEC.  She receives this on day 1 of a 14 day cycle with Neulasta given on day 2 for granulocyte support.  She is taking Super B complex daily and her previous neuropathy has resolved.  Her nails continue to be hyperpigmented.  She is applying tea tree oil occasionally, but not daily as recommended.  She denies fevers, chills, nausea, vomiting, constipation, diarrhea, numbness, or any further concerns.  A 10 point ROS is neg.   Medications:  Current Outpatient Prescriptions  Medication Sig Dispense Refill  . B Complex-C (SUPER B COMPLEX PO) Take 1 tablet by mouth daily.      Marland Kitchen dexamethasone (DECADRON) 4 MG tablet Take 4 mg by mouth 2 (two) times daily with a meal.      . lidocaine-prilocaine (EMLA) cream Apply 1 application topically as needed.      Marland Kitchen LORazepam (ATIVAN) 1 MG tablet Take 1 tablet (1 mg total) by mouth every 6 (six) hours as needed for anxiety.  30 tablet  1  . potassium chloride SA (K-DUR,KLOR-CON) 20 MEQ tablet Take 1 tablet (20 mEq total) by mouth 2 (two) times daily. For 4 days  8 tablet  1  . prochlorperazine (COMPAZINE) 10 MG tablet Take 1 tablet (10 mg total) by mouth every 6 (six) hours as needed (Nausea or vomiting).  30 tablet  1  . zolpidem (AMBIEN) 5 MG tablet Take 1 tablet (5 mg total) by mouth at bedtime as needed for sleep.  30 tablet  0   No current facility-administered  medications for this visit.     Allergies:  Allergies  Allergen Reactions  . Codeine Other (See Comments)    Hot in chest    Medical History: Past Medical History  Diagnosis Date  . Chronic bronchitis   . Palpitation     Tachycardia reported by monitor clerk during a symptomatic spell  . Chest pain     Admitted to [facility] in 09/2011; refused stress test  . Atrial septal defect 1996    Surgical repair in 1996  . Tobacco abuse     60 pack years; 1.5 packs per day  .  Anxiety   . Anemia   . Breast cancer     Surgical History:  Past Surgical History  Procedure Laterality Date  . Cholecystectomy    . Cesarean section    . Vein bypass surgery    . Breast biopsy Bilateral      Review of Systems: A 10 point review of systems was conducted and is otherwise negative except for what is noted above.     Physical Exam: Blood pressure 139/62, pulse 77, temperature 98.2 F (36.8 C), temperature source Oral, resp. rate 18, height 5' (1.524 m), weight 131 lb 8 oz (59.648 kg), last menstrual period 05/20/2011. GENERAL: Patient is a well appearing female in no acute distress HEENT:  Sclerae anicteric.  Oropharynx clear and moist. No ulcerations or evidence of oropharyngeal candidiasis. Neck is supple.  NODES:  No cervical, supraclavicular, or axillary lymphadenopathy palpated.  BREAST EXAM:  Deferred. LUNGS:  Clear to auscultation bilaterally.  No wheezes or rhonchi. HEART:  Regular rate and rhythm. No murmur appreciated. ABDOMEN:  Soft, nontender.  Positive, normoactive bowel sounds. No organomegaly palpated. MSK:  No focal spinal tenderness to palpation. Full range of motion bilaterally in the upper extremities. EXTREMITIES:  No peripheral edema.   SKIN:  Clear with no obvious rashes or skin changes. Nails are becoming hyperpigmented and slightly brittle NEURO:  Nonfocal. Well oriented.  Appropriate affect. ECOG PERFORMANCE STATUS: 1 - Symptomatic but completely ambulatory   Lab Results: Lab Results  Component Value Date   WBC 26.6* 11/11/2013   HGB 8.6* 11/11/2013   HCT 27.2* 11/11/2013   MCV 69.7* 11/11/2013   PLT 142* 11/11/2013     Chemistry      Component Value Date/Time   NA 147* 11/11/2013 1051   NA 141 09/23/2012 0414   K 3.3* 11/11/2013 1051   K 3.8 09/23/2012 0414   CL 107 09/23/2012 0414   CO2 28 11/11/2013 1051   CO2 29 10/30/2011 1419   BUN 6.5* 11/11/2013 1051   BUN 7 09/23/2012 0414   CREATININE 0.8 11/11/2013 1051   CREATININE 0.90  09/23/2012 0414      Component Value Date/Time   CALCIUM 9.3 11/11/2013 1051   CALCIUM 10.1 10/30/2011 1419   ALKPHOS 90 11/11/2013 1051   AST 20 11/11/2013 1051   ALT 15 11/11/2013 1051   BILITOT 0.38 11/11/2013 1051      Assessment and Plan: Paula Navarro 63 y.o. female with  1. ER positive bilateral breast cancer.  The patient is here to receive neoadjuvant chemotherapy, FEC.  She is tolerating this treatment well.  Her CBC is stable today.  She will proceed with cycle 5 day 1 of chemotherapy.  A CMP is pending.    2. Neuropathy.  This is currently resolved. I recommended she continue taking Super B complex daily.    3. Nail dyscrasia.  I continued to recommend  the patient soak her nails in tea tree oil daily.    4. Microcytic anemia.  Iron studies are normal.  Likely due to chemotherapy.  We will monitor and transfuse if indicated.    The patient will return tomorrow for Neulasta and in one week for labs and evaluation for chemotoxicities.  She knows to contact us for any questions or concerns.  We can certainly see her sooner if needed.  I spent 25 minutes counseling the patient face to face.  The total time spent in the appointment was 30 minutes.  Minette Headland, [place] [phone] 11/12/2013 9:01 AM   ADDENDUM:    I personally saw this patient and performed a substantive portion of this encounter with the listed APP documented above.   Patient was diagnosis of bilateral breast cancers. She is interested in breast conservation and therefore she is undergoing neoadjuvant chemotherapy. Today she is here for cycle 5 day 1 of FEC. We do plan on giving her a total of 6 cycles of FEC. Overall she's been tolerating her chemotherapy very nicely. She is having a good clinical response to her therapy. Patient does have an elevated WBC count this most likely is secondary to Neulasta injections that she receives. We will continue to monitor her  counts closely. She is also anemic with a hemoglobin of 8.6. This may be due to her chemotherapy. She seems to be asymptomatic again we will continue to monitor her. She did have iron studies performed that were normal your  Patient will be seen back in one week for followup and blood work  Deatra Robinson, MD

## 2013-11-11 ENCOUNTER — Other Ambulatory Visit: Payer: Self-pay

## 2013-11-11 ENCOUNTER — Other Ambulatory Visit (HOSPITAL_BASED_OUTPATIENT_CLINIC_OR_DEPARTMENT_OTHER): Payer: Medicare Other

## 2013-11-11 ENCOUNTER — Other Ambulatory Visit: Payer: Medicare Other

## 2013-11-11 ENCOUNTER — Ambulatory Visit (HOSPITAL_BASED_OUTPATIENT_CLINIC_OR_DEPARTMENT_OTHER): Payer: Medicare Other | Admitting: Adult Health

## 2013-11-11 ENCOUNTER — Ambulatory Visit (HOSPITAL_BASED_OUTPATIENT_CLINIC_OR_DEPARTMENT_OTHER): Payer: Medicare Other

## 2013-11-11 ENCOUNTER — Encounter: Payer: Self-pay | Admitting: Adult Health

## 2013-11-11 VITALS — BP 139/62 | HR 77 | Temp 98.2°F | Resp 18 | Ht 60.0 in | Wt 131.5 lb

## 2013-11-11 DIAGNOSIS — C50912 Malignant neoplasm of unspecified site of left female breast: Principal | ICD-10-CM

## 2013-11-11 DIAGNOSIS — Z170 Estrogen receptor positive status [ER+]: Secondary | ICD-10-CM

## 2013-11-11 DIAGNOSIS — Z5111 Encounter for antineoplastic chemotherapy: Secondary | ICD-10-CM

## 2013-11-11 DIAGNOSIS — D509 Iron deficiency anemia, unspecified: Secondary | ICD-10-CM

## 2013-11-11 DIAGNOSIS — C50911 Malignant neoplasm of unspecified site of right female breast: Secondary | ICD-10-CM

## 2013-11-11 DIAGNOSIS — C50919 Malignant neoplasm of unspecified site of unspecified female breast: Secondary | ICD-10-CM

## 2013-11-11 DIAGNOSIS — L608 Other nail disorders: Secondary | ICD-10-CM

## 2013-11-11 DIAGNOSIS — C773 Secondary and unspecified malignant neoplasm of axilla and upper limb lymph nodes: Secondary | ICD-10-CM

## 2013-11-11 DIAGNOSIS — E876 Hypokalemia: Secondary | ICD-10-CM

## 2013-11-11 LAB — COMPREHENSIVE METABOLIC PANEL (CC13)
ALT: 15 U/L (ref 0–55)
ANION GAP: 9 meq/L (ref 3–11)
AST: 20 U/L (ref 5–34)
Albumin: 3.4 g/dL — ABNORMAL LOW (ref 3.5–5.0)
Alkaline Phosphatase: 90 U/L (ref 40–150)
BILIRUBIN TOTAL: 0.38 mg/dL (ref 0.20–1.20)
BUN: 6.5 mg/dL — ABNORMAL LOW (ref 7.0–26.0)
CHLORIDE: 110 meq/L — AB (ref 98–109)
CO2: 28 mEq/L (ref 22–29)
Calcium: 9.3 mg/dL (ref 8.4–10.4)
Creatinine: 0.8 mg/dL (ref 0.6–1.1)
Glucose: 121 mg/dl (ref 70–140)
POTASSIUM: 3.3 meq/L — AB (ref 3.5–5.1)
SODIUM: 147 meq/L — AB (ref 136–145)
TOTAL PROTEIN: 5.8 g/dL — AB (ref 6.4–8.3)

## 2013-11-11 LAB — CBC WITH DIFFERENTIAL/PLATELET
BASO%: 1.3 % (ref 0.0–2.0)
Basophils Absolute: 0.4 10*3/uL — ABNORMAL HIGH (ref 0.0–0.1)
EOS ABS: 0.1 10*3/uL (ref 0.0–0.5)
EOS%: 0.5 % (ref 0.0–7.0)
HCT: 27.2 % — ABNORMAL LOW (ref 34.8–46.6)
HGB: 8.6 g/dL — ABNORMAL LOW (ref 11.6–15.9)
LYMPH#: 2.7 10*3/uL (ref 0.9–3.3)
LYMPH%: 10 % — ABNORMAL LOW (ref 14.0–49.7)
MCH: 22.1 pg — ABNORMAL LOW (ref 25.1–34.0)
MCHC: 31.6 g/dL (ref 31.5–36.0)
MCV: 69.7 fL — ABNORMAL LOW (ref 79.5–101.0)
MONO#: 1.6 10*3/uL — AB (ref 0.1–0.9)
MONO%: 5.9 % (ref 0.0–14.0)
NEUT%: 82.3 % — ABNORMAL HIGH (ref 38.4–76.8)
NEUTROS ABS: 21.9 10*3/uL — AB (ref 1.5–6.5)
NRBC: 1 % — AB (ref 0–0)
Platelets: 142 10*3/uL — ABNORMAL LOW (ref 145–400)
RBC: 3.9 10*6/uL (ref 3.70–5.45)
RDW: 16.3 % — AB (ref 11.2–14.5)
WBC: 26.6 10*3/uL — AB (ref 3.9–10.3)

## 2013-11-11 MED ORDER — PALONOSETRON HCL INJECTION 0.25 MG/5ML
INTRAVENOUS | Status: AC
  Filled 2013-11-11: qty 5

## 2013-11-11 MED ORDER — FLUOROURACIL CHEMO INJECTION 2.5 GM/50ML
500.0000 mg/m2 | Freq: Once | INTRAVENOUS | Status: AC
  Administered 2013-11-11: 800 mg via INTRAVENOUS
  Filled 2013-11-11: qty 16

## 2013-11-11 MED ORDER — DEXAMETHASONE SODIUM PHOSPHATE 20 MG/5ML IJ SOLN
12.0000 mg | Freq: Once | INTRAMUSCULAR | Status: AC
  Administered 2013-11-11: 12 mg via INTRAVENOUS

## 2013-11-11 MED ORDER — SODIUM CHLORIDE 0.9 % IV SOLN
150.0000 mg | Freq: Once | INTRAVENOUS | Status: AC
  Administered 2013-11-11: 150 mg via INTRAVENOUS
  Filled 2013-11-11: qty 5

## 2013-11-11 MED ORDER — SODIUM CHLORIDE 0.9 % IV SOLN
500.0000 mg/m2 | Freq: Once | INTRAVENOUS | Status: AC
  Administered 2013-11-11: 800 mg via INTRAVENOUS
  Filled 2013-11-11: qty 40

## 2013-11-11 MED ORDER — EPIRUBICIN HCL CHEMO IV INJECTION 200 MG/100ML
100.0000 mg/m2 | Freq: Once | INTRAVENOUS | Status: AC
  Administered 2013-11-11: 158 mg via INTRAVENOUS
  Filled 2013-11-11: qty 79

## 2013-11-11 MED ORDER — POTASSIUM CHLORIDE CRYS ER 20 MEQ PO TBCR: 20.0000 meq | EXTENDED_RELEASE_TABLET | Freq: Two times a day (BID) | ORAL | Status: DC

## 2013-11-11 MED ORDER — HEPARIN SOD (PORK) LOCK FLUSH 100 UNIT/ML IV SOLN
500.0000 [IU] | Freq: Once | INTRAVENOUS | Status: AC | PRN
  Administered 2013-11-11: 500 [IU]
  Filled 2013-11-11: qty 5

## 2013-11-11 MED ORDER — DEXAMETHASONE SODIUM PHOSPHATE 20 MG/5ML IJ SOLN
INTRAMUSCULAR | Status: AC
  Filled 2013-11-11: qty 5

## 2013-11-11 MED ORDER — SODIUM CHLORIDE 0.9 % IV SOLN
Freq: Once | INTRAVENOUS | Status: AC
  Administered 2013-11-11: 12:00:00 via INTRAVENOUS

## 2013-11-11 MED ORDER — SODIUM CHLORIDE 0.9 % IJ SOLN
10.0000 mL | INTRAMUSCULAR | Status: DC | PRN
  Administered 2013-11-11: 10 mL
  Filled 2013-11-11: qty 10

## 2013-11-11 MED ORDER — PALONOSETRON HCL INJECTION 0.25 MG/5ML
0.2500 mg | Freq: Once | INTRAVENOUS | Status: AC
  Administered 2013-11-11: 0.25 mg via INTRAVENOUS

## 2013-11-11 NOTE — Telephone Encounter (Signed)
Infusion RN reports potassium of 3.3.  Per KK - pt should take 20 meq BID for 4 days.  Order sent.  Tammi Holland,RN in infusion notified and agrees to tell patient.

## 2013-11-12 ENCOUNTER — Ambulatory Visit (HOSPITAL_BASED_OUTPATIENT_CLINIC_OR_DEPARTMENT_OTHER): Payer: Medicare Other

## 2013-11-12 VITALS — BP 117/85 | HR 81 | Temp 97.9°F

## 2013-11-12 DIAGNOSIS — C50919 Malignant neoplasm of unspecified site of unspecified female breast: Secondary | ICD-10-CM

## 2013-11-12 DIAGNOSIS — C773 Secondary and unspecified malignant neoplasm of axilla and upper limb lymph nodes: Secondary | ICD-10-CM

## 2013-11-12 DIAGNOSIS — C50912 Malignant neoplasm of unspecified site of left female breast: Principal | ICD-10-CM

## 2013-11-12 DIAGNOSIS — Z5189 Encounter for other specified aftercare: Secondary | ICD-10-CM

## 2013-11-12 DIAGNOSIS — C50911 Malignant neoplasm of unspecified site of right female breast: Secondary | ICD-10-CM

## 2013-11-12 MED ORDER — PEGFILGRASTIM INJECTION 6 MG/0.6ML
6.0000 mg | Freq: Once | SUBCUTANEOUS | Status: AC
  Administered 2013-11-12: 6 mg via SUBCUTANEOUS
  Filled 2013-11-12: qty 0.6

## 2013-11-18 ENCOUNTER — Ambulatory Visit (HOSPITAL_BASED_OUTPATIENT_CLINIC_OR_DEPARTMENT_OTHER): Payer: Medicare Other | Admitting: Adult Health

## 2013-11-18 ENCOUNTER — Other Ambulatory Visit (HOSPITAL_BASED_OUTPATIENT_CLINIC_OR_DEPARTMENT_OTHER): Payer: Medicare Other

## 2013-11-18 ENCOUNTER — Encounter: Payer: Self-pay | Admitting: Adult Health

## 2013-11-18 VITALS — BP 119/77 | HR 90 | Temp 97.1°F | Resp 18 | Ht 60.0 in | Wt 124.0 lb

## 2013-11-18 DIAGNOSIS — C50912 Malignant neoplasm of unspecified site of left female breast: Principal | ICD-10-CM

## 2013-11-18 DIAGNOSIS — C50919 Malignant neoplasm of unspecified site of unspecified female breast: Secondary | ICD-10-CM

## 2013-11-18 DIAGNOSIS — L608 Other nail disorders: Secondary | ICD-10-CM

## 2013-11-18 DIAGNOSIS — C773 Secondary and unspecified malignant neoplasm of axilla and upper limb lymph nodes: Secondary | ICD-10-CM

## 2013-11-18 DIAGNOSIS — C50911 Malignant neoplasm of unspecified site of right female breast: Secondary | ICD-10-CM

## 2013-11-18 DIAGNOSIS — Z170 Estrogen receptor positive status [ER+]: Secondary | ICD-10-CM

## 2013-11-18 LAB — COMPREHENSIVE METABOLIC PANEL (CC13)
ALT: 15 U/L (ref 0–55)
AST: 18 U/L (ref 5–34)
Albumin: 3.9 g/dL (ref 3.5–5.0)
Alkaline Phosphatase: 169 U/L — ABNORMAL HIGH (ref 40–150)
Anion Gap: 11 mEq/L (ref 3–11)
BILIRUBIN TOTAL: 0.78 mg/dL (ref 0.20–1.20)
BUN: 11.6 mg/dL (ref 7.0–26.0)
CALCIUM: 10.2 mg/dL (ref 8.4–10.4)
CHLORIDE: 104 meq/L (ref 98–109)
CO2: 26 mEq/L (ref 22–29)
CREATININE: 0.8 mg/dL (ref 0.6–1.1)
Glucose: 132 mg/dl (ref 70–140)
Potassium: 4.2 mEq/L (ref 3.5–5.1)
Sodium: 140 mEq/L (ref 136–145)
Total Protein: 6.8 g/dL (ref 6.4–8.3)

## 2013-11-18 LAB — CBC WITH DIFFERENTIAL/PLATELET
BASO%: 0.5 % (ref 0.0–2.0)
BASOS ABS: 0 10*3/uL (ref 0.0–0.1)
EOS%: 2 % (ref 0.0–7.0)
Eosinophils Absolute: 0.1 10*3/uL (ref 0.0–0.5)
HCT: 30.1 % — ABNORMAL LOW (ref 34.8–46.6)
HEMOGLOBIN: 9.7 g/dL — AB (ref 11.6–15.9)
LYMPH%: 18.8 % (ref 14.0–49.7)
MCH: 22 pg — AB (ref 25.1–34.0)
MCHC: 32.2 g/dL (ref 31.5–36.0)
MCV: 68.4 fL — ABNORMAL LOW (ref 79.5–101.0)
MONO#: 0.3 10*3/uL (ref 0.1–0.9)
MONO%: 5 % (ref 0.0–14.0)
NEUT#: 4.7 10*3/uL (ref 1.5–6.5)
NEUT%: 73.7 % (ref 38.4–76.8)
Platelets: 209 10*3/uL (ref 145–400)
RBC: 4.4 10*6/uL (ref 3.70–5.45)
RDW: 16.2 % — ABNORMAL HIGH (ref 11.2–14.5)
WBC: 6.4 10*3/uL (ref 3.9–10.3)
lymph#: 1.2 10*3/uL (ref 0.9–3.3)

## 2013-11-18 NOTE — Progress Notes (Signed)
Hematology and Oncology Follow Up Visit  JAIELLE DLOUHY [id number] 28-Sep-1950 63 y.o. 11/19/2013 11:55 PM     Principle Diagnosis:Gudelia K Laurance Flatten 63 y.o. female with ER positive bilateral breast cancer   Prior Therapy:  1. Patient developed pain in her breasts and palpated masses. Because of this she went on to have further workup performed including mammograms and ultrasound. On 07/23/2013 patient had bilateral diagnostic mammogram she was noted to have bilateral breast masses on the right irregular mass lying posterior lateral breast associated with a large rounded and dense axillary nodes. The left showed slightly smaller irregular mass lying at the 11:00 to 12:00 position. There was a smaller adjacent cyst. Also noted to have a large dense axillary lymph nodes on the left. Because of this she was recommended ultrasound and biopsies. 08/07/2013 patient underwent ultrasound-guided right breast core needle biopsy will. As well as left biopsy. 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 The lymph node was positive for metastatic disease. On the left patient had a biopsy that also showed invasive ductal carcinoma grade 3 ER positive PR negative HER-2/neu negative with a proliferation marker Ki-67 80%.Her clinical and radiologic staging was T2 NX on the left side T2 N1 on the right side.   2. Patient was seen in the multidisciplinary breast clinic. Neoadjuvant treatment was recommended with chemotherapy consisting of FEC followed by Taxol. Patient underwent port placement, chemo education and her echocardiogram demonstrated a LVEF of 65-70%.   3. Patient began on neoadjuvant FEC dose dense starting on 2/16 /2015. A total of 6 cycles is planned. This will be followed by 12 weeks of Taxol   Current therapy:  Neoadjuvant FEC cycle 5 day 8  Interim History: [name] 63 y.o. female  with ER positive bilateral breast cancer who is here for evalaution following her fifth cycle of neoadjuvant FEC.  She receives this treatment on day 1 of a 14 day cycle with Neulasta support on day 2.    She is tolerating treatment well.  She continues to have hyperpigmentation of her nail beds.  She denies fevers, chills, nausea, vomiting, constipation, diarrhea, numbness, mouth pain, skin changes, or any further concerns.  A 10 point ROS is neg.   Medications:  Current Outpatient Prescriptions  Medication Sig Dispense Refill  . B Complex-C (SUPER B COMPLEX PO) Take 1 tablet by mouth daily.      Marland Kitchen lidocaine-prilocaine (EMLA) cream Apply 1 application topically as needed.      . potassium chloride SA (K-DUR,KLOR-CON) 20 MEQ tablet Take 1 tablet (20 mEq total) by mouth 2 (two) times daily. For 4 days  8 tablet  1  . zolpidem (AMBIEN) 5 MG tablet Take 1 tablet (5 mg total) by mouth at bedtime as needed for sleep.  30 tablet  0  . dexamethasone (DECADRON) 4 MG tablet Take 4 mg by mouth 2 (two) times daily with a meal.      . LORazepam (ATIVAN) 1 MG tablet Take 1 tablet (1 mg total) by mouth every 6 (six) hours as needed for anxiety.  30 tablet  1  . prochlorperazine (COMPAZINE) 10 MG tablet Take 1 tablet (10 mg total) by mouth every 6 (six) hours as needed (Nausea or vomiting).  30 tablet  1   No current facility-administered medications for this visit.     Allergies:  Allergies  Allergen Reactions  . Codeine Other (  See Comments)    Hot in chest    Medical History: Past Medical History  Diagnosis Date  . Chronic bronchitis   . Palpitation     Tachycardia reported by monitor clerk during a symptomatic spell  . Chest pain     Admitted to [facility] in 09/2011; refused stress test  . Atrial septal defect 1996    Surgical repair in 1996  . Tobacco abuse     60 pack years; 1.5 packs per day  . Anxiety   . Anemia   . Breast cancer     Surgical History:  Past Surgical History  Procedure  Laterality Date  . Cholecystectomy    . Cesarean section    . Vein bypass surgery    . Breast biopsy Bilateral      Review of Systems: A 10 point review of systems was conducted and is otherwise negative except for what is noted above.     Physical Exam: Blood pressure 119/77, pulse 90, temperature 97.1 F (36.2 C), temperature source Oral, resp. rate 18, height 5' (1.524 m), weight 124 lb (56.246 kg), last menstrual period 05/20/2011. GENERAL: Patient is a well appearing female in no acute distress HEENT:  Sclerae anicteric.  Oropharynx clear and moist. No ulcerations or evidence of oropharyngeal candidiasis. Neck is supple.  NODES:  No cervical, supraclavicular, or axillary lymphadenopathy palpated.  BREAST EXAM:  Deferred. LUNGS:  Clear to auscultation bilaterally.  No wheezes or rhonchi. HEART:  Regular rate and rhythm. No murmur appreciated. ABDOMEN:  Soft, nontender.  Positive, normoactive bowel sounds. No organomegaly palpated. MSK:  No focal spinal tenderness to palpation. Full range of motion bilaterally in the upper extremities. EXTREMITIES:  No peripheral edema.   SKIN:  Clear with no obvious rashes or skin changes. Fingernails are hyperpigmented at the nail bed and slightly brittle, they have not begun to separate from the nail bed.  NEURO:  Nonfocal. Well oriented.  Appropriate affect. ECOG PERFORMANCE STATUS: 1 - Symptomatic but completely ambulatory   Lab Results: Lab Results  Component Value Date   WBC 6.4 11/18/2013   HGB 9.7* 11/18/2013   HCT 30.1* 11/18/2013   MCV 68.4* 11/18/2013   PLT 209 11/18/2013     Chemistry      Component Value Date/Time   NA 140 11/18/2013 1023   NA 141 09/23/2012 0414   K 4.2 11/18/2013 1023   K 3.8 09/23/2012 0414   CL 107 09/23/2012 0414   CO2 26 11/18/2013 1023   CO2 29 10/30/2011 1419   BUN 11.6 11/18/2013 1023   BUN 7 09/23/2012 0414   CREATININE 0.8 11/18/2013 1023   CREATININE 0.90 09/23/2012 0414      Component Value Date/Time    CALCIUM 10.2 11/18/2013 1023   CALCIUM 10.1 10/30/2011 1419   ALKPHOS 169* 11/18/2013 1023   AST 18 11/18/2013 1023   ALT 15 11/18/2013 1023   BILITOT 0.78 11/18/2013 1023      Assessment and Plan: [name] 63 y.o. female with  1. ER positive bilateral breast cancer.  The patient is receiving neoadjuvant chemotherapy FEC.  She continues to tolerate this treatment well. Her labs are stable. I reviewed them with her in detail.  2. Neuropathy.  This is currently resolved.  She is taking super b complex daily and will continue this.  3. Nail dyscrasia.  Her nails are unchanged from last week.  She will continue to soak her nails in tea tree oil.    The  patient will return in one week for labs, evaluation, and cycle 6 of neoadjuvant FEC.   She knows to call us in the interim for any questions or concerns.  We can certainly see her sooner if needed.  I spent 25 minutes counseling the patient face to face.  The total time spent in the appointment was 30 minutes.  Minette Headland, [place] [phone] 11/19/2013 11:55 PM

## 2013-11-21 ENCOUNTER — Other Ambulatory Visit: Payer: Self-pay | Admitting: Oncology

## 2013-11-21 DIAGNOSIS — C50919 Malignant neoplasm of unspecified site of unspecified female breast: Secondary | ICD-10-CM

## 2013-11-25 ENCOUNTER — Ambulatory Visit (HOSPITAL_BASED_OUTPATIENT_CLINIC_OR_DEPARTMENT_OTHER): Payer: Medicare Other

## 2013-11-25 ENCOUNTER — Encounter: Payer: Self-pay | Admitting: Adult Health

## 2013-11-25 ENCOUNTER — Other Ambulatory Visit (HOSPITAL_BASED_OUTPATIENT_CLINIC_OR_DEPARTMENT_OTHER): Payer: Medicare Other

## 2013-11-25 ENCOUNTER — Ambulatory Visit (HOSPITAL_BASED_OUTPATIENT_CLINIC_OR_DEPARTMENT_OTHER): Payer: Medicare Other | Admitting: Adult Health

## 2013-11-25 ENCOUNTER — Other Ambulatory Visit: Payer: Medicare Other

## 2013-11-25 ENCOUNTER — Telehealth: Payer: Self-pay | Admitting: *Deleted

## 2013-11-25 ENCOUNTER — Other Ambulatory Visit (HOSPITAL_COMMUNITY): Payer: Self-pay | Admitting: Oncology

## 2013-11-25 ENCOUNTER — Other Ambulatory Visit: Payer: Self-pay | Admitting: Oncology

## 2013-11-25 VITALS — BP 132/75 | HR 71 | Temp 98.4°F | Resp 20 | Ht 60.0 in | Wt 127.0 lb

## 2013-11-25 DIAGNOSIS — C50911 Malignant neoplasm of unspecified site of right female breast: Secondary | ICD-10-CM

## 2013-11-25 DIAGNOSIS — C50919 Malignant neoplasm of unspecified site of unspecified female breast: Secondary | ICD-10-CM

## 2013-11-25 DIAGNOSIS — E876 Hypokalemia: Secondary | ICD-10-CM

## 2013-11-25 DIAGNOSIS — C50912 Malignant neoplasm of unspecified site of left female breast: Principal | ICD-10-CM

## 2013-11-25 DIAGNOSIS — C773 Secondary and unspecified malignant neoplasm of axilla and upper limb lymph nodes: Secondary | ICD-10-CM

## 2013-11-25 DIAGNOSIS — C50419 Malignant neoplasm of upper-outer quadrant of unspecified female breast: Secondary | ICD-10-CM

## 2013-11-25 DIAGNOSIS — Z170 Estrogen receptor positive status [ER+]: Secondary | ICD-10-CM

## 2013-11-25 DIAGNOSIS — Z5111 Encounter for antineoplastic chemotherapy: Secondary | ICD-10-CM

## 2013-11-25 LAB — COMPREHENSIVE METABOLIC PANEL (CC13)
ALT: 20 U/L (ref 0–55)
ANION GAP: 10 meq/L (ref 3–11)
AST: 26 U/L (ref 5–34)
Albumin: 3.7 g/dL (ref 3.5–5.0)
Alkaline Phosphatase: 98 U/L (ref 40–150)
BUN: 5 mg/dL — AB (ref 7.0–26.0)
CALCIUM: 9.7 mg/dL (ref 8.4–10.4)
CHLORIDE: 111 meq/L — AB (ref 98–109)
CO2: 25 meq/L (ref 22–29)
Creatinine: 0.8 mg/dL (ref 0.6–1.1)
Glucose: 91 mg/dl (ref 70–140)
POTASSIUM: 3.4 meq/L — AB (ref 3.5–5.1)
Sodium: 146 mEq/L — ABNORMAL HIGH (ref 136–145)
Total Bilirubin: 0.47 mg/dL (ref 0.20–1.20)
Total Protein: 6.4 g/dL (ref 6.4–8.3)

## 2013-11-25 LAB — CBC WITH DIFFERENTIAL/PLATELET
BASO%: 0.5 % (ref 0.0–2.0)
Basophils Absolute: 0.1 10*3/uL (ref 0.0–0.1)
EOS%: 0.5 % (ref 0.0–7.0)
Eosinophils Absolute: 0.1 10*3/uL (ref 0.0–0.5)
HEMATOCRIT: 28.2 % — AB (ref 34.8–46.6)
HEMOGLOBIN: 8.8 g/dL — AB (ref 11.6–15.9)
LYMPH%: 9 % — ABNORMAL LOW (ref 14.0–49.7)
MCH: 22.1 pg — ABNORMAL LOW (ref 25.1–34.0)
MCHC: 31.1 g/dL — ABNORMAL LOW (ref 31.5–36.0)
MCV: 71.1 fL — ABNORMAL LOW (ref 79.5–101.0)
MONO#: 1.5 10*3/uL — ABNORMAL HIGH (ref 0.1–0.9)
MONO%: 5.9 % (ref 0.0–14.0)
NEUT%: 84.1 % — ABNORMAL HIGH (ref 38.4–76.8)
NEUTROS ABS: 21.8 10*3/uL — AB (ref 1.5–6.5)
Platelets: 210 10*3/uL (ref 145–400)
RBC: 3.97 10*6/uL (ref 3.70–5.45)
RDW: 17.8 % — AB (ref 11.2–14.5)
WBC: 26 10*3/uL — ABNORMAL HIGH (ref 3.9–10.3)
lymph#: 2.3 10*3/uL (ref 0.9–3.3)

## 2013-11-25 MED ORDER — DEXAMETHASONE SODIUM PHOSPHATE 20 MG/5ML IJ SOLN
12.0000 mg | Freq: Once | INTRAMUSCULAR | Status: AC
  Administered 2013-11-25: 12 mg via INTRAVENOUS

## 2013-11-25 MED ORDER — SODIUM CHLORIDE 0.9 % IJ SOLN
10.0000 mL | INTRAMUSCULAR | Status: DC | PRN
  Administered 2013-11-25: 10 mL
  Filled 2013-11-25: qty 10

## 2013-11-25 MED ORDER — FLUOROURACIL CHEMO INJECTION 2.5 GM/50ML
500.0000 mg/m2 | Freq: Once | INTRAVENOUS | Status: AC
  Administered 2013-11-25: 800 mg via INTRAVENOUS
  Filled 2013-11-25: qty 16

## 2013-11-25 MED ORDER — SODIUM CHLORIDE 0.9 % IV SOLN
Freq: Once | INTRAVENOUS | Status: AC
  Administered 2013-11-25: 13:00:00 via INTRAVENOUS

## 2013-11-25 MED ORDER — PALONOSETRON HCL INJECTION 0.25 MG/5ML
0.2500 mg | Freq: Once | INTRAVENOUS | Status: AC
  Administered 2013-11-25: 0.25 mg via INTRAVENOUS

## 2013-11-25 MED ORDER — EPIRUBICIN HCL CHEMO IV INJECTION 200 MG/100ML
100.0000 mg/m2 | Freq: Once | INTRAVENOUS | Status: AC
  Administered 2013-11-25: 158 mg via INTRAVENOUS
  Filled 2013-11-25: qty 79

## 2013-11-25 MED ORDER — HEPARIN SOD (PORK) LOCK FLUSH 100 UNIT/ML IV SOLN
500.0000 [IU] | Freq: Once | INTRAVENOUS | Status: AC | PRN
  Administered 2013-11-25: 500 [IU]
  Filled 2013-11-25: qty 5

## 2013-11-25 MED ORDER — SODIUM CHLORIDE 0.9 % IV SOLN
150.0000 mg | Freq: Once | INTRAVENOUS | Status: AC
  Administered 2013-11-25: 150 mg via INTRAVENOUS
  Filled 2013-11-25: qty 5

## 2013-11-25 MED ORDER — PALONOSETRON HCL INJECTION 0.25 MG/5ML
INTRAVENOUS | Status: AC
  Filled 2013-11-25: qty 5

## 2013-11-25 MED ORDER — CYCLOPHOSPHAMIDE CHEMO INJECTION 1 GM
500.0000 mg/m2 | Freq: Once | INTRAMUSCULAR | Status: AC
  Administered 2013-11-25: 800 mg via INTRAVENOUS
  Filled 2013-11-25: qty 40

## 2013-11-25 MED ORDER — DEXAMETHASONE SODIUM PHOSPHATE 20 MG/5ML IJ SOLN
INTRAMUSCULAR | Status: AC
  Filled 2013-11-25: qty 5

## 2013-11-25 NOTE — Patient Instructions (Signed)
Potassium Content of Foods Potassium is a mineral found in many foods and drinks. It helps keep fluids and minerals balanced in your body and also affects how steadily your heart beats. The body needs potassium to control blood pressure and to keep the muscles and nervous system healthy. However, certain health conditions and medicine may require you to eat more or less potassium-rich foods and drinks. Your caregiver or dietitian will tell you how much potassium you should have each day. COMMON SERVING SIZES The list below tells you how big or small common portion sizes are:  1 oz.........4 stacked dice.  3 oz........Marland KitchenDeck of cards.  1 tsp.......Marland KitchenTip of little finger.  1 tbsp....Marland KitchenMarland KitchenThumb.  2 tbsp....Marland KitchenMarland KitchenGolf ball.   c..........Marland KitchenHalf of a fist.  1 c...........Marland KitchenA fist. FOODS AND DRINKS HIGH IN POTASSIUM More than 200 mg of potassium per serving. A serving size is  c (120 mL or noted gram weight) unless otherwise stated. While all the items on this list are high in potassium, some items are higher in potassium than others. Fruits  Apricots (sliced), 83 g.  Apricots (dried halves), 3 oz / 24 g.  Avocado (cubed),  c / 50 g.  Banana (sliced), 75 g.  Cantaloupe (cubed), 80 g.  Dates (pitted), 5 whole / 35 g.  Figs (dried), 4 whole / 32 g.  Guava, c / 55 g.  Honeydew, 1 wedge / 85 g.  Kiwi (sliced), 90 g.  Nectarine, 1 small / 129 g.  Orange, 1 medium / 131 g.  Orange juice.  Pomegranate seeds, 87 g.  Pomegranate juice.  Prunes (pitted), 3 whole / 30 g.  Prune juice, 3 oz / 90 mL.  Seedless raisins, 3 tbsp / 27 g. Vegetables  Artichoke,  of a medium / 64 g.  Asparagus (boiled), 90 g.  Baked beans,  c / 63 g.  Bamboo shoots,  c / 38 g.  Beets (cooked slices), 85 g.  Broccoli (boiled), 78 g.  Brussels sprout (boiled), 78 g.  Butternut squash (baked), 103 g.  Chickpea (cooked), 82 g.  Green peas (cooked), 80 g.  Hubbard squash (baked cubes),  c /  68 g.  Kidney beans (cooked), 5 tbsp / 55 g.  Lima beans (cooked),  c / 43 g.  Navy beans (cooked),  c / 61 g.  Potato (baked), 61 g.  Potato (boiled), 78 g.  Pumpkin (boiled), 123 g.  Refried beans,  c / 79 g.  Spinach (cooked),  c / 45 g.  Split peas (cooked),  c / 65 g.  Sun-dried tomatoes, 2 tbsp / 7 g.  Sweet potato (baked),  c / 50 g.  Tomato (chopped or sliced), 90 g.  Tomato juice.  Tomato paste, 4 tsp / 21 g.  Tomato sauce,  c / 61 g.  Vegetable juice.  White mushrooms (cooked), 78 g.  Yam (cooked or baked),  c / 34 g.  Zucchini squash (boiled), 90 g. Other Foods and Drinks  Almonds (whole),  c / 36 g.  Cashews (oil roasted),  c / 32 g.  Chocolate milk.  Chocolate pudding, 142 g.  Clams (steamed), 1.5 oz / 43 g.  Dark chocolate, 1.5 oz / 42 g.  Fish, 3 oz / 85 g.  King crab (steamed), 3 oz / 85 g.  Lobster (steamed), 4 oz / 113 g.  Milk (skim, 1%, 2%, whole), 1 c / 240 mL.  Milk chocolate, 2.3 oz / 66 g.  Milk shake.  Nonfat fruit  variety yogurt, 123 g.  Peanuts (oil roasted), 1 oz / 28 g.  Peanut butter, 2 tbsp / 32 g.  Pistachio nuts, 1 oz / 28 g.  Pumpkin seeds, 1 oz / 28 g.  Red meat (broiled, cooked, grilled), 3 oz / 85 g.  Scallops (steamed), 3 oz / 85 g.  Shredded wheat cereal (dry), 3 oblong biscuits / 75 g.  Spaghetti sauce,  c / 66 g.  Sunflower seeds (dry roasted), 1 oz / 28 g.  Veggie burger, 1 patty / 70 g. FOODS MODERATE IN POTASSIUM Between 150 mg and 200 mg per serving. A serving is  c (120 mL or noted gram weight) unless otherwise stated. Fruits  Grapefruit,  of the fruit / 123 g.  Grapefruit juice.  Pineapple juice.  Plums (sliced), 83 g.  Tangerine, 1 large / 120 g. Vegetables  Carrots (boiled), 78 g.  Carrots (sliced), 61 g.  Rhubarb (cooked with sugar), 120 g.  Rutabaga (cooked), 120 g.  Sweet corn (cooked), 75 g.  Yellow snap beans (cooked), 63 g. Other Foods and  Drinks   Bagel, 1 bagel / 98 g.  Chicken breast (roasted and chopped),  c / 70 g.  Chocolate ice cream / 66 g.  Pita bread, 1 large / 64 g.  Shrimp (steamed), 4 oz / 113 g.  Swiss cheese (diced), 70 g.  Vanilla ice cream, 66 g.  Vanilla pudding, 140 g. FOODS LOW IN POTASSIUM Less than 150 mg per serving. A serving size is  cup (120 mL or noted gram weight) unless otherwise stated. If you eat more than 1 serving of a food low in potassium, the food may be considered a food high in potassium. Fruits  Apple (slices), 55 g.  Apple juice.  Applesauce, 122 g.  Blackberries, 72 g.  Blueberries, 74 g.  Cranberries, 50 g.  Cranberry juice.  Fruit cocktail, 119 g.  Fruit punch.  Grapes, 46 g.  Grape juice.  Mandarin oranges (canned), 126 g.  Peach (slices), 77 g.  Pineapple (chunks), 83 g.  Raspberries, 62 g.  Red cherries (without pits), 78 g.  Strawberries (sliced), 83 g.  Watermelon (diced), 76 g. Vegetables  Alfalfa sprouts, 17 g.  Bell peppers (sliced), 46 g.  Cabbage (shredded), 35 g.  Cauliflower (boiled), 62 g.  Celery, 51 g.  Collard greens (boiled), 95 g.  Cucumber (sliced), 52 g.  Eggplant (cubed), 41 g.  Green beans (boiled), 63 g.  Lettuce (shredded), 1 c / 36 g.  Onions (sauteed), 44 g.  Radishes (sliced), 58 g.  Spaghetti squash, 51 g. Other Foods and Drinks  [employer], 1 slice / 28 g.  Black tea.  Brown rice (cooked), 98 g.  Butter croissant, 1 medium / 57 g.  Carbonated soda.  Coffee.  Cheddar cheese (diced), 66 g.  Corn flake cereal (dry), 14 g.  Cottage cheese, 118 g.  Cream of rice cereal (cooked), 122 g.  Cream of wheat cereal (cooked), 126 g.  Crisped rice cereal (dry), 14 g.  Egg (boiled, fried, poached, omelet, scrambled), 1 large / 46 61 g.  English muffin, 1 muffin / 57 g.  Frozen ice pop, 1 pop / 55 g.  Graham cracker, 1 large rectangular cracker / 14 g.  Jelly beans, 112  g.  Non-dairy whipped topping.  Oatmeal, 88 g.  Orange sherbet, 74 g.  Puffed rice cereal (dry), 7 g.  Pasta (cooked), 70 g.  Rice cakes, 4 cakes / 36  g.  Sugared doughnut, 4 oz / 116 g.  White bread, 1 slice / 30 g.  White rice (cooked), 79 93 g.  Wild rice (cooked), 82 g.  Yellow cake, 1 slice / 68 g. Document Released: 02/28/2005 Document Revised: 07/03/2012 Document Reviewed: 12/01/2011 [facility] Patient Information 2014 [place].

## 2013-11-25 NOTE — Progress Notes (Signed)
Hematology and Oncology Follow Up Visit  Paula Navarro [id number] Oct 01, 1950 63 y.o. 11/27/2013 7:59 AM     Principle Diagnosis:Kenny K Laurance Flatten 63 y.o. female with ER positive bilateral breast cancer   Prior Therapy:  1. Patient developed pain in her breasts and palpated masses. Because of this she went on to have further workup performed including mammograms and ultrasound. On 07/23/2013 patient had bilateral diagnostic mammogram she was noted to have bilateral breast masses on the right irregular mass lying posterior lateral breast associated with a large rounded and dense axillary nodes. The left showed slightly smaller irregular mass lying at the 11:00 to 12:00 position. There was a smaller adjacent cyst. Also noted to have a large dense axillary lymph nodes on the left. Because of this she was recommended ultrasound and biopsies. 08/07/2013 patient underwent ultrasound-guided right breast core needle biopsy as well as left biopsy. 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 The lymph node was positive for metastatic disease. On the left patient had a biopsy that also showed invasive ductal carcinoma grade 3 ER positive PR negative HER-2/neu negative with a proliferation marker Ki-67 80%.Her clinical and radiologic staging was T2 NX on the left side T2 N1 on the right side.   2. Patient was seen in the multidisciplinary breast clinic. Neoadjuvant treatment was recommended with chemotherapy consisting of FEC followed by weekly Taxol. Patient underwent port placement, chemo education and her echocardiogram demonstrated a LVEF of 65-70%.   3. Patient began on neoadjuvant FEC dose dense starting on 2/16 /2015. A total of 6 cycles is planned. This will be followed by 12 weeks of Taxol   Current therapy:  Neoadjuvant FEC cycle 6 day 1  Interim History: Paula Navarro 63 y.o. female  with ER positive bilateral breast cancer who is here for evalaution prior to her sixth cycle of neoadjuvant FEC.  She receives this treatment on day 1 of a 14 day cycle with Neulasta support on day 2.    Patient is doing well today.  She is c/o soreness at her right port site that started yesterday.  She tells me that its swollen more so than usual.  She denies redness or warmth over the area.  She did have a rainbow tea this past Sunday and she did over exert herself moreso than usual.  She is otherwise well and denies fevers, chills, nuasea, vomting, consitpation, diarrhea, numbness/tingling, mouth pain, or any further concerns.  She had f/u with Dr. Marlou Starks but the waiting room was too busy and she didn't want to wait.  She would like it re-scheduled today.  Otherwise, a 10 point ROS is neg.   Medications:  Current Outpatient Prescriptions  Medication Sig Dispense Refill  . B Complex-C (SUPER B COMPLEX PO) Take 1 tablet by mouth daily.      Marland Kitchen dexamethasone (DECADRON) 4 MG tablet Take 4 mg by mouth 2 (two) times daily with a meal.      . lidocaine-prilocaine (EMLA) cream Apply 1 application topically as needed.      . potassium chloride SA (K-DUR,KLOR-CON) 20 MEQ tablet Take 1 tablet (20 mEq total) by mouth 2 (two) times daily. For 4 days  8 tablet  1  . zolpidem (AMBIEN) 5 MG tablet Take 1 tablet (5 mg total) by mouth at bedtime as needed for sleep.  30 tablet  0  . LORazepam (ATIVAN) 1 MG tablet  Take 1 tablet (1 mg total) by mouth every 6 (six) hours as needed for anxiety.  30 tablet  1  . prochlorperazine (COMPAZINE) 10 MG tablet TAKE 1 TABLET BY MOUTH EVERY 6 HOURS AS NEEDED FOR NAUSEA AND VOMITING  30 tablet  1   No current facility-administered medications for this visit.     Allergies:  Allergies  Allergen Reactions  . Codeine Other (See Comments)    Hot in chest    Medical History: Past Medical History  Diagnosis Date  . Chronic bronchitis   . Palpitation     Tachycardia  reported by monitor clerk during a symptomatic spell  . Chest pain     Admitted to [facility] in 09/2011; refused stress test  . Atrial septal defect 1996    Surgical repair in 1996  . Tobacco abuse     60 pack years; 1.5 packs per day  . Anxiety   . Anemia   . Breast cancer     Surgical History:  Past Surgical History  Procedure Laterality Date  . Cholecystectomy    . Cesarean section    . Vein bypass surgery    . Breast biopsy Bilateral      Review of Systems: A 10 point review of systems was conducted and is otherwise negative except for what is noted above.     Physical Exam: Blood pressure 132/75, pulse 71, temperature 98.4 F (36.9 C), temperature source Oral, resp. rate 20, height 5' (1.524 m), weight 127 lb (57.607 kg), last menstrual period 05/20/2011. GENERAL: Patient is a well appearing female in no acute distress HEENT:  Sclerae anicteric.  Oropharynx clear and moist. No ulcerations or evidence of oropharyngeal candidiasis. Neck is supple.  NODES:  No cervical, supraclavicular, or axillary lymphadenopathy palpated.  BREAST EXAM:  Right breast mass is palpable, unable to palpate left breast mass LUNGS:  Clear to auscultation bilaterally.  No wheezes or rhonchi. HEART:  Regular rate and rhythm. No murmur appreciated. ABDOMEN:  Soft, nontender.  Positive, normoactive bowel sounds. No organomegaly palpated. MSK:  No focal spinal tenderness to palpation. Full range of motion bilaterally in the upper extremities. EXTREMITIES:  No peripheral edema.   SKIN:  Clear with no obvious rashes or skin changes. Fingernails are hyperpigmented at the nail bed and slightly brittle, they have not begun to separate from the nail bed.  NEURO:  Nonfocal. Well oriented.  Appropriate affect. ECOG PERFORMANCE STATUS: 1 - Symptomatic but completely ambulatory   Lab Results: Lab Results  Component Value Date   WBC 26.0* 11/25/2013   HGB 8.8* 11/25/2013   HCT 28.2* 11/25/2013   MCV 71.1*  11/25/2013   PLT 210 11/25/2013     Chemistry      Component Value Date/Time   NA 146* 11/25/2013 1023   NA 141 09/23/2012 0414   K 3.4* 11/25/2013 1023   K 3.8 09/23/2012 0414   CL 107 09/23/2012 0414   CO2 25 11/25/2013 1023   CO2 29 10/30/2011 1419   BUN 5.0* 11/25/2013 1023   BUN 7 09/23/2012 0414   CREATININE 0.8 11/25/2013 1023   CREATININE 0.90 09/23/2012 0414      Component Value Date/Time   CALCIUM 9.7 11/25/2013 1023   CALCIUM 10.1 10/30/2011 1419   ALKPHOS 98 11/25/2013 1023   AST 26 11/25/2013 1023   ALT 20 11/25/2013 1023   BILITOT 0.47 11/25/2013 1023      Assessment and Plan: Paula Navarro 63 y.o. female with  1.  ER positive bilateral breast cancer.  The patient is receiving neoadjuvant chemotherapy FEC.  She continues to tolerate this treatment well. Her labs are stable. I reviewed them with her in detail.  She will proceed with cycle 6 of treatment today.  I will request rescheduling of her appt with Dr. Marlou Starks.    2. Neuropathy.  This is currently resolved.  She is taking super b complex daily and will continue this.  3. Nail dyscrasia.  Her nails are unchanged from last week.  She will continue to soak her nails in tea tree oil.    4. Hypokalemia.  I recommended the patient increase her potassium intake.  I gave her written information on increasing her potassium in her AVS.    The patient will return tomorrow for Neulasta and in one week for labs, and evaluation of chemotoxicities.  She knows to call us in the interim for any questions or concerns.  We can certainly see her sooner if needed.  I spent 25 minutes counseling the patient face to face.  The total time spent in the appointment was 30 minutes.  Minette Headland, [place] [phone] 11/27/2013 7:59 AM

## 2013-11-25 NOTE — Progress Notes (Signed)
Port flushed without problems, good blood return, no tenderness at site

## 2013-11-25 NOTE — Telephone Encounter (Signed)
Per [name], NP, I tried calling patient to inform her that she can get her injection at [facility] tomorrow, 11/26/13. It is scheduled at 2 pm on the 4th floor. Patient's home answering machine isn't set up to receive messages. I called and left a message with son, Kennyth Lose, on his cell phone. I will try calling patient tomorrow.

## 2013-11-25 NOTE — Patient Instructions (Signed)
[place] Discharge Instructions for Patients Receiving Chemotherapy  Today you received the following chemotherapy agents FEC  To help prevent nausea and vomiting after your treatment, we encourage you to take your nausea medication as needed   If you develop nausea and vomiting that is not controlled by your nausea medication, call the clinic.   BELOW ARE SYMPTOMS THAT SHOULD BE REPORTED IMMEDIATELY:  *FEVER GREATER THAN 100.5 F  *CHILLS WITH OR WITHOUT FEVER  NAUSEA AND VOMITING THAT IS NOT CONTROLLED WITH YOUR NAUSEA MEDICATION  *UNUSUAL SHORTNESS OF BREATH  *UNUSUAL BRUISING OR BLEEDING  TENDERNESS IN MOUTH AND THROAT WITH OR WITHOUT PRESENCE OF ULCERS  *URINARY PROBLEMS  *BOWEL PROBLEMS  UNUSUAL RASH Items with * indicate a potential emergency and should be followed up as soon as possible.  Feel free to call the clinic you have any questions or concerns. The clinic phone number is [phone].

## 2013-11-26 ENCOUNTER — Ambulatory Visit: Payer: Medicare Other

## 2013-11-26 ENCOUNTER — Encounter (HOSPITAL_COMMUNITY): Payer: Medicare Other | Attending: Hematology and Oncology

## 2013-11-26 VITALS — BP 116/59 | HR 68 | Temp 98.2°F | Resp 18

## 2013-11-26 DIAGNOSIS — C50419 Malignant neoplasm of upper-outer quadrant of unspecified female breast: Secondary | ICD-10-CM

## 2013-11-26 DIAGNOSIS — C50911 Malignant neoplasm of unspecified site of right female breast: Secondary | ICD-10-CM

## 2013-11-26 DIAGNOSIS — C50912 Malignant neoplasm of unspecified site of left female breast: Principal | ICD-10-CM

## 2013-11-26 DIAGNOSIS — Z5189 Encounter for other specified aftercare: Secondary | ICD-10-CM

## 2013-11-26 MED ORDER — PEGFILGRASTIM INJECTION 6 MG/0.6ML
6.0000 mg | Freq: Once | SUBCUTANEOUS | Status: AC
  Administered 2013-11-26: 6 mg via SUBCUTANEOUS
  Filled 2013-11-26: qty 0.6

## 2013-11-26 NOTE — Progress Notes (Signed)
Paula Navarro presents today for injection per MD orders. Neulasta 6mg  administered SQ in left Abdomen. Administration without incident. Patient tolerated well.

## 2013-11-29 ENCOUNTER — Encounter (HOSPITAL_COMMUNITY): Payer: Self-pay | Admitting: Emergency Medicine

## 2013-11-29 ENCOUNTER — Emergency Department (HOSPITAL_COMMUNITY)
Admission: EM | Admit: 2013-11-29 | Discharge: 2013-11-29 | Disposition: A | Discharge status: Home / Self Care | Payer: Medicare Other | Attending: Emergency Medicine | Admitting: Emergency Medicine

## 2013-11-29 DIAGNOSIS — J029 Acute pharyngitis, unspecified: Secondary | ICD-10-CM | POA: Insufficient documentation

## 2013-11-29 DIAGNOSIS — J40 Bronchitis, not specified as acute or chronic: Secondary | ICD-10-CM | POA: Insufficient documentation

## 2013-11-29 DIAGNOSIS — Z8774 Personal history of (corrected) congenital malformations of heart and circulatory system: Secondary | ICD-10-CM | POA: Insufficient documentation

## 2013-11-29 DIAGNOSIS — Z862 Personal history of diseases of the blood and blood-forming organs and certain disorders involving the immune mechanism: Secondary | ICD-10-CM | POA: Insufficient documentation

## 2013-11-29 DIAGNOSIS — IMO0002 Reserved for concepts with insufficient information to code with codable children: Secondary | ICD-10-CM | POA: Insufficient documentation

## 2013-11-29 DIAGNOSIS — C50919 Malignant neoplasm of unspecified site of unspecified female breast: Secondary | ICD-10-CM | POA: Insufficient documentation

## 2013-11-29 DIAGNOSIS — F411 Generalized anxiety disorder: Secondary | ICD-10-CM | POA: Insufficient documentation

## 2013-11-29 DIAGNOSIS — Z87891 Personal history of nicotine dependence: Secondary | ICD-10-CM | POA: Insufficient documentation

## 2013-11-29 DIAGNOSIS — Z79899 Other long term (current) drug therapy: Secondary | ICD-10-CM | POA: Insufficient documentation

## 2013-11-29 MED ORDER — AMOXICILLIN 250 MG PO CAPS
1000.0000 mg | ORAL_CAPSULE | Freq: Once | ORAL | Status: AC
  Administered 2013-11-29: 1000 mg via ORAL
  Filled 2013-11-29: qty 4

## 2013-11-29 MED ORDER — AMOXICILLIN 500 MG PO CAPS: 500.0000 mg | ORAL_CAPSULE | Freq: Three times a day (TID) | ORAL | Status: DC

## 2013-11-29 NOTE — Discharge Instructions (Signed)
Gargle with salt water. Amoxicillin 500 mg 3 times a day. Followup your Dr.

## 2013-11-29 NOTE — ED Provider Notes (Signed)
CSN: [id number]     Arrival date & time 11/29/13  1920 History   First MD Initiated Contact with Patient 11/29/13 1933     Chief Complaint  Patient presents with  . Sore Throat     (Consider location/radiation/quality/duration/timing/severity/associated sxs/prior Treatment) HPI..... sore throat since last night. No fever, chills, stiff neck. Patient was diagnosed with breast cancer in February 2015 and is on chemotherapy presently. Severity is mild. Nothing makes symptoms better or worse.  Past Medical History  Diagnosis Date  . Chronic bronchitis   . Palpitation     Tachycardia reported by monitor clerk during a symptomatic spell  . Chest pain     Admitted to [facility] in 09/2011; refused stress test  . Atrial septal defect 1996    Surgical repair in 1996  . Tobacco abuse     60 pack years; 1.5 packs per day  . Anxiety   . Anemia   . Breast cancer    Past Surgical History  Procedure Laterality Date  . Cholecystectomy    . Cesarean section    . Vein bypass surgery    . Breast biopsy Bilateral    Family History  Problem Relation Age of Onset  . Breast cancer Maternal Aunt    History  Substance Use Topics  . Smoking status: Former Smoker -- 1.50 packs/day for 40 years    Types: Cigarettes  . Smokeless tobacco: Never Used  . Alcohol Use: No   OB History   Grav Para Term Preterm Abortions TAB SAB Ect Mult Living                 Review of Systems  All other systems reviewed and are negative.     Allergies  Codeine  Home Medications   Prior to Admission medications   Medication Sig Start Date End Date Taking? Authorizing Provider  amoxicillin (AMOXIL) 500 MG capsule Take 1 capsule (500 mg total) by mouth 3 (three) times daily. 11/29/13   Nat Christen, MD  B Complex-C (SUPER B COMPLEX PO) Take 1 tablet by mouth daily.    Historical Provider, MD  dexamethasone (DECADRON) 4 MG tablet Take 4 mg by mouth 2 (two) times daily with a meal.    Historical Provider, MD   lidocaine-prilocaine (EMLA) cream Apply 1 application topically as needed.    Historical Provider, MD  LORazepam (ATIVAN) 1 MG tablet Take 1 tablet (1 mg total) by mouth every 6 (six) hours as needed for anxiety. 11/03/13   Minette Headland, NP  potassium chloride SA (K-DUR,KLOR-CON) 20 MEQ tablet Take 1 tablet (20 mEq total) by mouth 2 (two) times daily. For 4 days 11/11/13   Deatra Robinson, MD  prochlorperazine (COMPAZINE) 10 MG tablet TAKE 1 TABLET BY MOUTH EVERY 6 HOURS AS NEEDED FOR NAUSEA AND VOMITING 11/21/13   Minette Headland, NP  zolpidem (AMBIEN) 5 MG tablet Take 1 tablet (5 mg total) by mouth at bedtime as needed for sleep. 10/06/13   Deatra Robinson, MD   BP 105/48  Pulse 73  Temp(Src) 97.8 F (36.6 C) (Oral)  Resp 20  SpO2 99%  LMP 05/20/2011 Physical Exam  Nursing note and vitals reviewed. Constitutional: She is oriented to person, place, and time. She appears well-developed and well-nourished.  HENT:  Head: Normocephalic and atraumatic.  Oral pharyngeal area erythematous.  No peritonsillar abscess  Eyes: Conjunctivae and EOM are normal. Pupils are equal, round, and reactive to light.  Neck: Normal range of motion. Neck  supple.  Cardiovascular: Normal rate, regular rhythm and normal heart sounds.   Pulmonary/Chest: Effort normal and breath sounds normal.  Abdominal: Soft. Bowel sounds are normal.  Musculoskeletal: Normal range of motion.  Neurological: She is alert and oriented to person, place, and time.  Skin: Skin is warm and dry.  Psychiatric: She has a normal mood and affect. Her behavior is normal.    ED Course  Procedures (including critical care time) Labs Review Labs Reviewed - No data to display  Imaging Review No results found.   EKG Interpretation None      MDM   Final diagnoses:  Pharyngitis    Patient is immunocompromised. Will treat with antibiotic secondary to her immune status.  She is nontoxic appearing    Nat Christen, MD 11/29/13  1944

## 2013-11-29 NOTE — ED Notes (Signed)
Pt is a cancer pt and was told to come here to be checked out for her sore throat. Pt states it hurts to swallow due to her sore throat. Pt states she has pain in her shoulder blades.

## 2013-11-29 NOTE — ED Notes (Signed)
Pt alert & oriented x4, stable gait. Patient  given discharge instructions, paperwork & prescription(s). Patient verbalized understanding. Pt left department w/ no further questions. 

## 2013-12-01 NOTE — Progress Notes (Signed)
Triage Call Report - 11/29/13 6:45 pm.  Sent to scan.

## 2013-12-02 ENCOUNTER — Other Ambulatory Visit (HOSPITAL_BASED_OUTPATIENT_CLINIC_OR_DEPARTMENT_OTHER): Payer: Medicare Other

## 2013-12-02 ENCOUNTER — Ambulatory Visit (HOSPITAL_BASED_OUTPATIENT_CLINIC_OR_DEPARTMENT_OTHER): Payer: Medicare Other | Admitting: Adult Health

## 2013-12-02 ENCOUNTER — Encounter: Payer: Self-pay | Admitting: Adult Health

## 2013-12-02 ENCOUNTER — Telehealth: Payer: Self-pay | Admitting: *Deleted

## 2013-12-02 VITALS — BP 108/66 | HR 84 | Temp 99.5°F | Resp 18 | Ht 60.0 in | Wt 123.5 lb

## 2013-12-02 DIAGNOSIS — Z170 Estrogen receptor positive status [ER+]: Secondary | ICD-10-CM

## 2013-12-02 DIAGNOSIS — G5790 Unspecified mononeuropathy of unspecified lower limb: Secondary | ICD-10-CM

## 2013-12-02 DIAGNOSIS — R5381 Other malaise: Secondary | ICD-10-CM

## 2013-12-02 DIAGNOSIS — C50912 Malignant neoplasm of unspecified site of left female breast: Principal | ICD-10-CM

## 2013-12-02 DIAGNOSIS — E876 Hypokalemia: Secondary | ICD-10-CM

## 2013-12-02 DIAGNOSIS — R5383 Other fatigue: Secondary | ICD-10-CM

## 2013-12-02 DIAGNOSIS — B370 Candidal stomatitis: Secondary | ICD-10-CM

## 2013-12-02 DIAGNOSIS — C50919 Malignant neoplasm of unspecified site of unspecified female breast: Secondary | ICD-10-CM

## 2013-12-02 DIAGNOSIS — C50911 Malignant neoplasm of unspecified site of right female breast: Secondary | ICD-10-CM

## 2013-12-02 DIAGNOSIS — C773 Secondary and unspecified malignant neoplasm of axilla and upper limb lymph nodes: Secondary | ICD-10-CM

## 2013-12-02 DIAGNOSIS — D649 Anemia, unspecified: Secondary | ICD-10-CM

## 2013-12-02 LAB — CBC WITH DIFFERENTIAL/PLATELET
BASO%: 0.6 % (ref 0.0–2.0)
BASOS ABS: 0 10*3/uL (ref 0.0–0.1)
EOS%: 1.7 % (ref 0.0–7.0)
Eosinophils Absolute: 0.1 10*3/uL (ref 0.0–0.5)
HCT: 24.9 % — ABNORMAL LOW (ref 34.8–46.6)
HGB: 7.9 g/dL — ABNORMAL LOW (ref 11.6–15.9)
LYMPH#: 0.7 10*3/uL — AB (ref 0.9–3.3)
LYMPH%: 11.5 % — ABNORMAL LOW (ref 14.0–49.7)
MCH: 22.5 pg — ABNORMAL LOW (ref 25.1–34.0)
MCHC: 31.6 g/dL (ref 31.5–36.0)
MCV: 71.2 fL — ABNORMAL LOW (ref 79.5–101.0)
MONO#: 0.2 10*3/uL (ref 0.1–0.9)
MONO%: 3.8 % (ref 0.0–14.0)
NEUT#: 4.9 10*3/uL (ref 1.5–6.5)
NEUT%: 82.4 % — ABNORMAL HIGH (ref 38.4–76.8)
Platelets: 139 10*3/uL — ABNORMAL LOW (ref 145–400)
RBC: 3.5 10*6/uL — ABNORMAL LOW (ref 3.70–5.45)
RDW: 17.8 % — ABNORMAL HIGH (ref 11.2–14.5)
WBC: 5.9 10*3/uL (ref 3.9–10.3)

## 2013-12-02 LAB — COMPREHENSIVE METABOLIC PANEL (CC13)
ALT: 22 U/L (ref 0–55)
AST: 22 U/L (ref 5–34)
Albumin: 3.5 g/dL (ref 3.5–5.0)
Alkaline Phosphatase: 143 U/L (ref 40–150)
Anion Gap: 9 mEq/L (ref 3–11)
BUN: 7.9 mg/dL (ref 7.0–26.0)
CALCIUM: 9.7 mg/dL (ref 8.4–10.4)
CHLORIDE: 105 meq/L (ref 98–109)
CO2: 25 mEq/L (ref 22–29)
Creatinine: 0.7 mg/dL (ref 0.6–1.1)
Glucose: 124 mg/dl (ref 70–140)
POTASSIUM: 3.4 meq/L — AB (ref 3.5–5.1)
SODIUM: 140 meq/L (ref 136–145)
Total Bilirubin: 0.85 mg/dL (ref 0.20–1.20)
Total Protein: 6 g/dL — ABNORMAL LOW (ref 6.4–8.3)

## 2013-12-02 MED ORDER — FLUCONAZOLE 200 MG PO TABS: 200.0000 mg | ORAL_TABLET | Freq: Every day | ORAL | Status: DC

## 2013-12-02 NOTE — Telephone Encounter (Signed)
Per staff message and POF I have scheduled appts. I have tried to schedule first treatment on 5/12 but MD visit to late. Also no available on 5/26 for treatment, scheduler advised of both issues. JMW

## 2013-12-02 NOTE — Patient Instructions (Signed)
Potassium Content of Foods Potassium is a mineral found in many foods and drinks. It helps keep fluids and minerals balanced in your body and also affects how steadily your heart beats. The body needs potassium to control blood pressure and to keep the muscles and nervous system healthy. However, certain health conditions and medicine may require you to eat more or less potassium-rich foods and drinks. Your caregiver or dietitian will tell you how much potassium you should have each day. COMMON SERVING SIZES The list below tells you how big or small common portion sizes are:  1 oz.........4 stacked dice.  3 oz........Marland KitchenDeck of cards.  1 tsp.......Marland KitchenTip of little finger.  1 tbsp....Marland KitchenMarland KitchenThumb.  2 tbsp....Marland KitchenMarland KitchenGolf ball.   c..........Marland KitchenHalf of a fist.  1 c...........Marland KitchenA fist. FOODS AND DRINKS HIGH IN POTASSIUM More than 200 mg of potassium per serving. A serving size is  c (120 mL or noted gram weight) unless otherwise stated. While all the items on this list are high in potassium, some items are higher in potassium than others. Fruits  Apricots (sliced), 83 g.  Apricots (dried halves), 3 oz / 24 g.  Avocado (cubed),  c / 50 g.  Banana (sliced), 75 g.  Cantaloupe (cubed), 80 g.  Dates (pitted), 5 whole / 35 g.  Figs (dried), 4 whole / 32 g.  Guava, c / 55 g.  Honeydew, 1 wedge / 85 g.  Kiwi (sliced), 90 g.  Nectarine, 1 small / 129 g.  Orange, 1 medium / 131 g.  Orange juice.  Pomegranate seeds, 87 g.  Pomegranate juice.  Prunes (pitted), 3 whole / 30 g.  Prune juice, 3 oz / 90 mL.  Seedless raisins, 3 tbsp / 27 g. Vegetables  Artichoke,  of a medium / 64 g.  Asparagus (boiled), 90 g.  Baked beans,  c / 63 g.  Bamboo shoots,  c / 38 g.  Beets (cooked slices), 85 g.  Broccoli (boiled), 78 g.  Brussels sprout (boiled), 78 g.  Butternut squash (baked), 103 g.  Chickpea (cooked), 82 g.  Green peas (cooked), 80 g.  Hubbard squash (baked cubes),  c /  68 g.  Kidney beans (cooked), 5 tbsp / 55 g.  Lima beans (cooked),  c / 43 g.  Navy beans (cooked),  c / 61 g.  Potato (baked), 61 g.  Potato (boiled), 78 g.  Pumpkin (boiled), 123 g.  Refried beans,  c / 79 g.  Spinach (cooked),  c / 45 g.  Split peas (cooked),  c / 65 g.  Sun-dried tomatoes, 2 tbsp / 7 g.  Sweet potato (baked),  c / 50 g.  Tomato (chopped or sliced), 90 g.  Tomato juice.  Tomato paste, 4 tsp / 21 g.  Tomato sauce,  c / 61 g.  Vegetable juice.  White mushrooms (cooked), 78 g.  Yam (cooked or baked),  c / 34 g.  Zucchini squash (boiled), 90 g. Other Foods and Drinks  Almonds (whole),  c / 36 g.  Cashews (oil roasted),  c / 32 g.  Chocolate milk.  Chocolate pudding, 142 g.  Clams (steamed), 1.5 oz / 43 g.  Dark chocolate, 1.5 oz / 42 g.  Fish, 3 oz / 85 g.  King crab (steamed), 3 oz / 85 g.  Lobster (steamed), 4 oz / 113 g.  Milk (skim, 1%, 2%, whole), 1 c / 240 mL.  Milk chocolate, 2.3 oz / 66 g.  Milk shake.  Nonfat fruit  variety yogurt, 123 g.  Peanuts (oil roasted), 1 oz / 28 g.  Peanut butter, 2 tbsp / 32 g.  Pistachio nuts, 1 oz / 28 g.  Pumpkin seeds, 1 oz / 28 g.  Red meat (broiled, cooked, grilled), 3 oz / 85 g.  Scallops (steamed), 3 oz / 85 g.  Shredded wheat cereal (dry), 3 oblong biscuits / 75 g.  Spaghetti sauce,  c / 66 g.  Sunflower seeds (dry roasted), 1 oz / 28 g.  Veggie burger, 1 patty / 70 g. FOODS MODERATE IN POTASSIUM Between 150 mg and 200 mg per serving. A serving is  c (120 mL or noted gram weight) unless otherwise stated. Fruits  Grapefruit,  of the fruit / 123 g.  Grapefruit juice.  Pineapple juice.  Plums (sliced), 83 g.  Tangerine, 1 large / 120 g. Vegetables  Carrots (boiled), 78 g.  Carrots (sliced), 61 g.  Rhubarb (cooked with sugar), 120 g.  R[place]abaga (cooked), 120 g.  Sweet corn (cooked), 75 g.  Yellow snap beans (cooked), 63 g. Other Foods and  Drinks   Bagel, 1 bagel / 98 g.  Chicken breast (roasted and chopped),  c / 70 g.  Chocolate ice cream / 66 g.  Pita bread, 1 large / 64 g.  Shrimp (steamed), 4 oz / 113 g.  Swiss cheese (diced), 70 g.  Vanilla ice cream, 66 g.  Vanilla pudding, 140 g. FOODS LOW IN POTASSIUM Less than 150 mg per serving. A serving size is  cup (120 mL or noted gram weight) unless otherwise stated. If you eat more than 1 serving of a food low in potassium, the food may be considered a food high in potassium. Fruits  Apple (slices), 55 g.  Apple juice.  Applesauce, 122 g.  Blackberries, 72 g.  Blueberries, 74 g.  Cranberries, 50 g.  Cranberry juice.  Fruit cocktail, 119 g.  Fruit punch.  Grapes, 46 g.  Grape juice.  Mandarin oranges (canned), 126 g.  Peach (slices), 77 g.  Pineapple (chunks), 83 g.  Raspberries, 62 g.  Red cherries (without pits), 78 g.  Strawberries (sliced), 83 g.  Watermelon (diced), 76 g. Vegetables  Alfalfa sprouts, 17 g.  Bell peppers (sliced), 46 g.  Cabbage (shredded), 35 g.  Cauliflower (boiled), 62 g.  Celery, 51 g.  Collard greens (boiled), 95 g.  Cucumber (sliced), 52 g.  Eggplant (cubed), 41 g.  Green beans (boiled), 63 g.  Lettuce (shredded), 1 c / 36 g.  Onions (sauteed), 44 g.  Radishes (sliced), 58 g.  Spaghetti squash, 51 g. Other Foods and Drinks  [employer], 1 slice / 28 g.  Black tea.  Brown rice (cooked), 98 g.  Butter croissant, 1 medium / 57 g.  Carbonated soda.  Coffee.  Cheddar cheese (diced), 66 g.  Corn flake cereal (dry), 14 g.  Cottage cheese, 118 g.  Cream of rice cereal (cooked), 122 g.  Cream of wheat cereal (cooked), 126 g.  Crisped rice cereal (dry), 14 g.  Egg (boiled, fried, poached, omelet, scrambled), 1 large / 46 61 g.  English muffin, 1 muffin / 57 g.  Frozen ice pop, 1 pop / 55 g.  Graham cracker, 1 large rectangular cracker / 14 g.  Jelly beans, 112  g.  Non-dairy whipped topping.  Oatmeal, 88 g.  Orange sherbet, 74 g.  Puffed rice cereal (dry), 7 g.  Pasta (cooked), 70 g.  Rice cakes, 4 cakes / 36  g.  Sugared doughnut, 4 oz / 116 g.  White bread, 1 slice / 30 g.  White rice (cooked), 79 93 g.  Wild rice (cooked), 82 g.  Yellow cake, 1 slice / 68 g. Document Released: 02/28/2005 Document Revised: 07/03/2012 Document Reviewed: 12/01/2011 [facility] Patient Information 2014 [place]. Fluconazole tablets What is this medicine? FLUCONAZOLE (floo KON na zole) is an antifungal medicine. It is used to treat certain kinds of fungal or yeast infections. This medicine may be used for other purposes; ask your health care provider or pharmacist if you have questions. COMMON BRAND NAME(S): Diflucan What should I tell my health care provider before I take this medicine? They need to know if you have any of these conditions: -electrolyte abnormalities -history of irregular heart beat -kidney disease -an unusual or allergic reaction to fluconazole, other azole antifungals, medicines, foods, dyes, or preservatives -pregnant or trying to get pregnant -breast-feeding How should I use this medicine? Take this medicine by mouth. Follow the directions on the prescription label. Do not take your medicine more often than directed. Talk to your pediatrician regarding the use of this medicine in children. Special care may be needed. This medicine has been used in children as young as 23 months of age. Overdosage: If you think you have taken too much of this medicine contact a poison control center or emergency room at once. NOTE: This medicine is only for you. Do not share this medicine with others. What if I miss a dose? If you miss a dose, take it as soon as you can. If it is almost time for your next dose, take only that dose. Do not take double or extra doses. What may interact with this medicine? Do not take this medicine with any of  the following medications: -astemizole -certain medicines for irregular heart beat like dofetilide, dronedarone, quinidine -cisapride -erythromycin -lomitapide -other medicines that prolong the QT interval (cause an abnormal heart rhythm) -pimozide -terfenadine -thioridazine -tolvaptan -ziprasidone  This medicine may also interact with the following medications: -antiviral medicines for HIV or AIDS -birth control pills -certain antibiotics like rifabutin, rifampin -certain medicines for blood pressure like amlodipine, isradipine, felodipine, hydrochlorothiazide, losartan, nifedipine -certain medicines for cancer like cyclophosphamide, vinblastine, vincristine -certain medicines for cholesterol like atorvastatin, lovastatin, fluvastatin, simvastatin -certain medicines for depression, anxiety, or psychotic disturbances like amitriptyline, midazolam, nortriptyline, triazolam -certain medicines for diabetes like glipizide, glyburide, tolbutamide -certain medicines for pain like alfentanil, fentanyl, methadone -certain medicines for seizures like carbamazepine, phenytoin -certain medicines that treat or prevent blood clots like warfarin -halofantrine -medicines that lower your chance of fighting infection like cyclosporine, prednisone, tacrolimus -NSAIDS, medicines for pain and inflammation, like celecoxib, diclofenac, flurbiprofen, ibuprofen, meloxicam, naproxen -other medicines for fungal infections -sirolimus -theophylline -tofacitinib This list may not describe all possible interactions. Give your health care provider a list of all the medicines, herbs, non-prescription drugs, or dietary supplements you use. Also tell them if you smoke, drink alcohol, or use illegal drugs. Some items may interact with your medicine. What should I watch for while using this medicine? Visit your doctor or health care professional for regular checkups. If you are taking this medicine for a long time you  may need blood work. Tell your doctor if your symptoms do not improve. Some fungal infections need many weeks or months of treatment to cure. Alcohol can increase possible damage to your liver. Avoid alcoholic drinks. If you have a vaginal infection, do not have sex until you have finished your  treatment. You can wear a sanitary napkin. Do not use tampons. Wear freshly washed cotton, not synthetic, panties. What side effects may I notice from receiving this medicine? Side effects that you should report to your doctor or health care professional as soon as possible: -allergic reactions like skin rash or itching, hives, swelling of the lips, mouth, tongue, or throat -dark urine -feeling dizzy or faint -irregular heartbeat or chest pain -redness, blistering, peeling or loosening of the skin, including inside the mouth -trouble breathing -unusual bruising or bleeding -vomiting -yellowing of the eyes or skin  Side effects that usually do not require medical attention (report to your doctor or health care professional if they continue or are bothersome): -changes in how food tastes -diarrhea -headache -stomach upset or nausea This list may not describe all possible side effects. Call your doctor for medical advice about side effects. You may report side effects to FDA at 1-800-FDA-1088. Where should I keep my medicine? Keep out of the reach of children. Store at room temperature below 30 degrees C (86 degrees F). Throw away any medicine after the expiration date. NOTE: This sheet is a summary. It may not cover all possible information. If you have questions about this medicine, talk to your doctor, pharmacist, or health care provider.  2014, Elsevier/Gold Standard. (2013-02-22 16:13:04) Anemia, Nonspecific Anemia is a condition in which the concentration of red blood cells or hemoglobin in the blood is below normal. Hemoglobin is a substance in red blood cells that carries oxygen to the tissues  of the body. Anemia results in not enough oxygen reaching these tissues.  CAUSES  Common causes of anemia include:   Excessive bleeding. Bleeding may be internal or external. This includes excessive bleeding from periods (in women) or from the intestine.   Poor nutrition.   Chronic kidney, thyroid, and liver disease.  Bone marrow disorders that decrease red blood cell production.  Cancer and treatments for cancer.  HIV, AIDS, and their treatments.  Spleen problems that increase red blood cell destruction.  Blood disorders.  Excess destruction of red blood cells due to infection, medicines, and autoimmune disorders. SIGNS AND SYMPTOMS   Minor weakness.   Dizziness.   Headache.  Palpitations.   Shortness of breath, especially with exercise.   Paleness.  Cold sensitivity.  Indigestion.  Nausea.  Difficulty sleeping.  Difficulty concentrating. Symptoms may occur suddenly or they may develop slowly.  DIAGNOSIS  Additional blood tests are often needed. These help your health care provider determine the best treatment. Your health care provider will check your stool for blood and look for other causes of blood loss.  TREATMENT  Treatment varies depending on the cause of the anemia. Treatment can include:   Supplements of iron, vitamin A12, or folic acid.   Hormone medicines.   A blood transfusion. This may be needed if blood loss is severe.   Hospitalization. This may be needed if there is significant continual blood loss.   Dietary changes.  Spleen removal. HOME CARE INSTRUCTIONS Keep all follow-up appointments. It often takes many weeks to correct anemia, and having your health care provider check on your condition and your response to treatment is very important. SEEK IMMEDIATE MEDICAL CARE IF:   You develop extreme weakness, shortness of breath, or chest pain.   You become dizzy or have trouble concentrating.  You develop heavy vaginal  bleeding.   You develop a rash.   You have bloody or black, tarry stools.   You faint.  You vomit up blood.   You vomit repeatedly.   You have abdominal pain.  You have a fever or persistent symptoms for more than 2 3 days.   You have a fever and your symptoms suddenly get worse.   You are dehydrated.  MAKE SURE YOU:  Understand these instructions.  Will watch your condition.  Will get help right away if you are not doing well or get worse. Document Released: 08/24/2004 Document Revised: 03/19/2013 Document Reviewed: 01/10/2013 [facility] Patient Information 2014 [place].

## 2013-12-02 NOTE — Progress Notes (Signed)
Triage Call Report dtd 12/01/13 732 pm.  Sent to scan.

## 2013-12-02 NOTE — Progress Notes (Signed)
Hematology and Oncology Follow Up Visit  Paula Navarro [id number] [birth year] 63 y.o. 12/02/2013 1:32 PM     Principle Diagnosis:Paula Navarro 63 y.o. female with ER positive bilateral breast cancer   Prior Therapy:  1. Patient developed pain in her breasts and palpated masses. Because of this she went on to have further workup performed including mammograms and ultrasound. On 07/23/2013 patient had bilateral diagnostic mammogram she was noted to have bilateral breast masses on the right irregular mass lying posterior lateral breast associated with a large rounded and dense axillary nodes. The left showed slightly smaller irregular mass lying at the 11:00 to 12:00 position. There was a smaller adjacent cyst. Also noted to have a large dense axillary lymph nodes on the left. Because of this she was recommended ultrasound and biopsies. 08/07/2013 patient underwent ultrasound-guided right breast core needle biopsy as well as left biopsy. 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 The lymph node was positive for metastatic disease. On the left patient had a biopsy that also showed invasive ductal carcinoma grade 3 ER positive PR negative HER-2/neu negative with a proliferation marker Ki-67 80%.Her clinical and radiologic staging was T2 NX on the left side T2 N1 on the right side.   2. Patient was seen in the multidisciplinary breast clinic. Neoadjuvant treatment was recommended with chemotherapy consisting of FEC followed by weekly Taxol. Patient underwent port placement, chemo education and her echocardiogram demonstrated a LVEF of 65-70%.   3. Patient began on neoadjuvant FEC dose dense starting on 2/16 /2015. A total of 6 cycles is planned. This will be followed by 12 weeks of Taxol   Current therapy:  Neoadjuvant FEC cycle 6 day 8  Interim History: Paula Navarro 63 y.o. female with  ER positive bilateral breast cancer who is here for evalaution after her sixth cycle of neoadjuvant FEC.  She receives this treatment on day 1 of a 14 day cycle with Neulasta support on day 2.    She is here following treatment with FEC.  She is doing moderately well.  She does have mild intermittent numbness in her toes.  She is taking super b complex daily.  She is fatigued.  She has been at her sisters working and feels like it is making it worse.  She was recently in the emergency room for pharyngitis.  She was prescribed Amoxicillin TID and it is improving.  Otherwise, she is doing well and a 10 point ROS is neg.      Medications:  Current Outpatient Prescriptions  Medication Sig Dispense Refill  . B Complex-C (SUPER B COMPLEX PO) Take 1 tablet by mouth daily.      . potassium chloride SA (K-DUR,KLOR-CON) 20 MEQ tablet Take 1 tablet (20 mEq total) by mouth 2 (two) times daily. For 4 days  8 tablet  1  . amoxicillin (AMOXIL) 500 MG capsule Take 1 capsule (500 mg total) by mouth 3 (three) times daily.  30 capsule  0  . dexamethasone (DECADRON) 4 MG tablet Take 4 mg by mouth 2 (two) times daily with a meal.      . lidocaine-prilocaine (EMLA) cream Apply 1 application topically as needed.      Marland Kitchen LORazepam (ATIVAN) 1 MG tablet Take 1 tablet (1 mg total) by mouth every 6 (six) hours as needed for anxiety.  30 tablet  1  . prochlorperazine (COMPAZINE) 10 MG tablet  TAKE 1 TABLET BY MOUTH EVERY 6 HOURS AS NEEDED FOR NAUSEA AND VOMITING  30 tablet  1  . zolpidem (AMBIEN) 5 MG tablet Take 1 tablet (5 mg total) by mouth at bedtime as needed for sleep.  30 tablet  0   No current facility-administered medications for this visit.     Allergies:  Allergies  Allergen Reactions  . Codeine Other (See Comments)    Hot in chest    Medical History: Past Medical History  Diagnosis Date  . Chronic bronchitis   . Palpitation     Tachycardia reported by monitor clerk during a symptomatic spell  . Chest  pain     Admitted to [facility] in 09/2011; refused stress test  . Atrial septal defect 1996    Surgical repair in 1996  . Tobacco abuse     60 pack years; 1.5 packs per day  . Anxiety   . Anemia   . Breast cancer     Surgical History:  Past Surgical History  Procedure Laterality Date  . Cholecystectomy    . Cesarean section    . Vein bypass surgery    . Breast biopsy Bilateral      Review of Systems: A 10 point review of systems was conducted and is otherwise negative except for what is noted above.     Physical Exam: Blood pressure 108/66, pulse 84, temperature 99.5 F (37.5 C), temperature source Oral, resp. rate 18, height 5' (1.524 m), weight 123 lb 8 oz (56.019 kg), last menstrual period 05/20/2011. GENERAL: Patient is a well appearing female in no acute distress HEENT:  Sclerae anicteric.  Oropharynx clear and moist. Oral candida in mouth. Neck is supple.  NODES:  No cervical, supraclavicular, or axillary lymphadenopathy palpated.  BREAST EXAM:  Right breast mass is palpable, unable to palpate left breast mass LUNGS:  Clear to auscultation bilaterally.  No wheezes or rhonchi. HEART:  Regular rate and rhythm. No murmur appreciated. ABDOMEN:  Soft, nontender.  Positive, normoactive bowel sounds. No organomegaly palpated. MSK:  No focal spinal tenderness to palpation. Full range of motion bilaterally in the upper extremities. EXTREMITIES:  No peripheral edema.   SKIN:  Clear with no obvious rashes or skin changes. Fingernails are hyperpigmented at the nail bed and slightly brittle, they have not begun to separate from the nail bed.  NEURO:  Nonfocal. Well oriented.  Appropriate affect. ECOG PERFORMANCE STATUS: 1 - Symptomatic but completely ambulatory   Lab Results: Lab Results  Component Value Date   WBC 5.9 12/02/2013   HGB 7.9* 12/02/2013   HCT 24.9* 12/02/2013   MCV 71.2* 12/02/2013   PLT 139* 12/02/2013     Chemistry      Component Value Date/Time   NA 140 12/02/2013 1239    NA 141 09/23/2012 0414   K 3.4* 12/02/2013 1239   K 3.8 09/23/2012 0414   CL 107 09/23/2012 0414   CO2 25 12/02/2013 1239   CO2 29 10/30/2011 1419   BUN 7.9 12/02/2013 1239   BUN 7 09/23/2012 0414   CREATININE 0.7 12/02/2013 1239   CREATININE 0.90 09/23/2012 0414      Component Value Date/Time   CALCIUM 9.7 12/02/2013 1239   CALCIUM 10.1 10/30/2011 1419   ALKPHOS 143 12/02/2013 1239   AST 22 12/02/2013 1239   ALT 22 12/02/2013 1239   BILITOT 0.85 12/02/2013 1239      Assessment and Plan: Everette Rank 63 y.o. female with  1. ER positive bilateral  breast cancer.  The patient is receiving neoadjuvant chemotherapy FEC.  She continues to tolerate this treatment well. Her labs are stable. I reviewed them with her in detail.  She has now completed Adriamycin and Cytoxan chemotherapy.  I reviewed her next chemotherapy regimen, Taxol with her in detail.    2. Neuropathy.  This is slightly worse in her toes.  She is taking super b complex daily and will continue this.  3. Hypokalemia.  I recommended the patient increase her potassium intake.  I gave her written information on increasing her potassium in her AVS.   4. Thrush: I prescribed Fluconazole 252m po daily.   5. Anemia:  The patient has a hemoglobin of 7.9, she is asymptomatic.  For now we will monitor.  She was given written details of symptoms of anemia and reasons to call [place]    The patient will return in 1-2 weeks for labs, eval and Taxol.  She knows to call [place] the interim for any questions or concerns.  We can certainly see her sooner if needed.  I spent 25 minutes counseling the patient face to face.  The total time spent in the appointment was 30 minutes.  LMinette Headland [phone]/11/2013 1:32 PM

## 2013-12-03 ENCOUNTER — Telehealth: Payer: Self-pay | Admitting: Oncology

## 2013-12-03 NOTE — Telephone Encounter (Signed)
S/w kendall from dr toth's office regarding the pt needing to r/s the f/u appt that she cancelled back in jan. R/s the appt and lmonvm of the d/t of the appt in may at [facility].

## 2013-12-03 NOTE — Telephone Encounter (Signed)
Mailed the pt her appt with dr toth.

## 2013-12-04 ENCOUNTER — Telehealth: Payer: Self-pay | Admitting: *Deleted

## 2013-12-04 NOTE — Telephone Encounter (Signed)
Called pt to verify appt dates. Pt will not come on 5/12, but knows to come on 5/19 for labs, doctor's appt and treatment . Pt will also be seeing Dr. Marlou Starks on 12/11/13. Pt verbalized understanding. No further concerns. Message to be forwarded to Charlestine Massed, NP.

## 2013-12-08 ENCOUNTER — Telehealth: Payer: Self-pay | Admitting: *Deleted

## 2013-12-08 ENCOUNTER — Telehealth: Payer: Self-pay | Admitting: Adult Health

## 2013-12-08 NOTE — Telephone Encounter (Signed)
recvd trmt sch/per noted in system/pt will not be doing trmt 5/12 but will follow the remaining sch/per pof conflict A/5/40 trmt & appt time due to trmt time avail

## 2013-12-08 NOTE — Telephone Encounter (Signed)
Per staff phone call and POF I have schedueld appts.  JMW  

## 2013-12-09 ENCOUNTER — Other Ambulatory Visit: Payer: Medicare Other

## 2013-12-09 ENCOUNTER — Telehealth: Payer: Self-pay | Admitting: Adult Health

## 2013-12-09 ENCOUNTER — Ambulatory Visit: Payer: Medicare Other | Admitting: Adult Health

## 2013-12-09 ENCOUNTER — Ambulatory Visit: Payer: Medicare Other

## 2013-12-09 NOTE — Telephone Encounter (Signed)
, °

## 2013-12-11 ENCOUNTER — Ambulatory Visit (INDEPENDENT_AMBULATORY_CARE_PROVIDER_SITE_OTHER): Payer: Medicare Other | Admitting: General Surgery

## 2013-12-15 ENCOUNTER — Encounter (INDEPENDENT_AMBULATORY_CARE_PROVIDER_SITE_OTHER): Payer: Self-pay | Admitting: General Surgery

## 2013-12-16 ENCOUNTER — Ambulatory Visit (HOSPITAL_BASED_OUTPATIENT_CLINIC_OR_DEPARTMENT_OTHER): Payer: Medicare Other | Admitting: Adult Health

## 2013-12-16 ENCOUNTER — Other Ambulatory Visit: Payer: Self-pay | Admitting: Oncology

## 2013-12-16 ENCOUNTER — Other Ambulatory Visit: Payer: Medicare Other

## 2013-12-16 ENCOUNTER — Encounter: Payer: Self-pay | Admitting: Adult Health

## 2013-12-16 ENCOUNTER — Other Ambulatory Visit: Payer: Self-pay

## 2013-12-16 ENCOUNTER — Ambulatory Visit: Payer: Medicare Other | Admitting: Adult Health

## 2013-12-16 ENCOUNTER — Ambulatory Visit (HOSPITAL_BASED_OUTPATIENT_CLINIC_OR_DEPARTMENT_OTHER): Payer: Medicare Other

## 2013-12-16 ENCOUNTER — Other Ambulatory Visit (HOSPITAL_BASED_OUTPATIENT_CLINIC_OR_DEPARTMENT_OTHER): Payer: Medicare Other

## 2013-12-16 VITALS — BP 146/72 | HR 67 | Temp 98.8°F | Resp 16

## 2013-12-16 VITALS — BP 156/66 | HR 69 | Temp 98.2°F | Resp 18 | Ht 60.0 in | Wt 126.0 lb

## 2013-12-16 DIAGNOSIS — C773 Secondary and unspecified malignant neoplasm of axilla and upper limb lymph nodes: Secondary | ICD-10-CM

## 2013-12-16 DIAGNOSIS — Z5111 Encounter for antineoplastic chemotherapy: Secondary | ICD-10-CM

## 2013-12-16 DIAGNOSIS — C50912 Malignant neoplasm of unspecified site of left female breast: Principal | ICD-10-CM

## 2013-12-16 DIAGNOSIS — C50919 Malignant neoplasm of unspecified site of unspecified female breast: Secondary | ICD-10-CM

## 2013-12-16 DIAGNOSIS — Z170 Estrogen receptor positive status [ER+]: Secondary | ICD-10-CM

## 2013-12-16 DIAGNOSIS — C50911 Malignant neoplasm of unspecified site of right female breast: Secondary | ICD-10-CM

## 2013-12-16 LAB — CBC WITH DIFFERENTIAL/PLATELET
BASO%: 1.3 % (ref 0.0–2.0)
BASOS ABS: 0.1 10*3/uL (ref 0.0–0.1)
EOS%: 2.1 % (ref 0.0–7.0)
Eosinophils Absolute: 0.2 10*3/uL (ref 0.0–0.5)
HCT: 30.8 % — ABNORMAL LOW (ref 34.8–46.6)
HGB: 9.5 g/dL — ABNORMAL LOW (ref 11.6–15.9)
LYMPH%: 14.4 % (ref 14.0–49.7)
MCH: 23.1 pg — AB (ref 25.1–34.0)
MCHC: 31 g/dL — ABNORMAL LOW (ref 31.5–36.0)
MCV: 74.6 fL — AB (ref 79.5–101.0)
MONO#: 1.2 10*3/uL — ABNORMAL HIGH (ref 0.1–0.9)
MONO%: 12.8 % (ref 0.0–14.0)
NEUT#: 6.3 10*3/uL (ref 1.5–6.5)
NEUT%: 69.4 % (ref 38.4–76.8)
Platelets: 419 10*3/uL — ABNORMAL HIGH (ref 145–400)
RBC: 4.13 10*6/uL (ref 3.70–5.45)
RDW: 20.6 % — ABNORMAL HIGH (ref 11.2–14.5)
WBC: 9.1 10*3/uL (ref 3.9–10.3)
lymph#: 1.3 10*3/uL (ref 0.9–3.3)

## 2013-12-16 LAB — COMPREHENSIVE METABOLIC PANEL (CC13)
ALK PHOS: 66 U/L (ref 40–150)
ALT: 22 U/L (ref 0–55)
AST: 24 U/L (ref 5–34)
Albumin: 3.5 g/dL (ref 3.5–5.0)
Anion Gap: 13 mEq/L — ABNORMAL HIGH (ref 3–11)
BILIRUBIN TOTAL: 0.56 mg/dL (ref 0.20–1.20)
BUN: 5.3 mg/dL — AB (ref 7.0–26.0)
CO2: 24 mEq/L (ref 22–29)
CREATININE: 0.7 mg/dL (ref 0.6–1.1)
Calcium: 9.4 mg/dL (ref 8.4–10.4)
Chloride: 109 mEq/L (ref 98–109)
Glucose: 148 mg/dl — ABNORMAL HIGH (ref 70–140)
Potassium: 3.7 mEq/L (ref 3.5–5.1)
Sodium: 146 mEq/L — ABNORMAL HIGH (ref 136–145)
Total Protein: 6.4 g/dL (ref 6.4–8.3)

## 2013-12-16 MED ORDER — DEXAMETHASONE SODIUM PHOSPHATE 20 MG/5ML IJ SOLN
INTRAMUSCULAR | Status: AC
  Filled 2013-12-16: qty 5

## 2013-12-16 MED ORDER — ONDANSETRON 8 MG/NS 50 ML IVPB
INTRAVENOUS | Status: AC
  Filled 2013-12-16: qty 8

## 2013-12-16 MED ORDER — PACLITAXEL CHEMO INJECTION 300 MG/50ML
80.0000 mg/m2 | Freq: Once | INTRAVENOUS | Status: AC
  Administered 2013-12-16: 126 mg via INTRAVENOUS
  Filled 2013-12-16: qty 21

## 2013-12-16 MED ORDER — DEXAMETHASONE SODIUM PHOSPHATE 20 MG/5ML IJ SOLN
20.0000 mg | Freq: Once | INTRAMUSCULAR | Status: AC
  Administered 2013-12-16: 20 mg via INTRAVENOUS

## 2013-12-16 MED ORDER — ONDANSETRON 8 MG/50ML IVPB (CHCC)
8.0000 mg | Freq: Once | INTRAVENOUS | Status: AC
  Administered 2013-12-16: 8 mg via INTRAVENOUS

## 2013-12-16 MED ORDER — SODIUM CHLORIDE 0.9 % IJ SOLN
10.0000 mL | INTRAMUSCULAR | Status: DC | PRN
  Administered 2013-12-16: 10 mL
  Filled 2013-12-16: qty 10

## 2013-12-16 MED ORDER — FAMOTIDINE IN NACL 20-0.9 MG/50ML-% IV SOLN
20.0000 mg | Freq: Once | INTRAVENOUS | Status: AC
  Administered 2013-12-16: 20 mg via INTRAVENOUS

## 2013-12-16 MED ORDER — DEXAMETHASONE 4 MG PO TABS: ORAL_TABLET | ORAL | Status: DC

## 2013-12-16 MED ORDER — SODIUM CHLORIDE 0.9 % IV SOLN
Freq: Once | INTRAVENOUS | Status: AC
  Administered 2013-12-16: 11:00:00 via INTRAVENOUS

## 2013-12-16 MED ORDER — FAMOTIDINE IN NACL 20-0.9 MG/50ML-% IV SOLN
INTRAVENOUS | Status: AC
  Filled 2013-12-16: qty 50

## 2013-12-16 MED ORDER — DIPHENHYDRAMINE HCL 50 MG/ML IJ SOLN
25.0000 mg | Freq: Once | INTRAMUSCULAR | Status: AC
  Administered 2013-12-16: 25 mg via INTRAVENOUS

## 2013-12-16 MED ORDER — DIPHENHYDRAMINE HCL 50 MG/ML IJ SOLN
INTRAMUSCULAR | Status: AC
  Filled 2013-12-16: qty 1

## 2013-12-16 MED ORDER — HEPARIN SOD (PORK) LOCK FLUSH 100 UNIT/ML IV SOLN
500.0000 [IU] | Freq: Once | INTRAVENOUS | Status: AC | PRN
  Administered 2013-12-16: 500 [IU]
  Filled 2013-12-16: qty 5

## 2013-12-16 NOTE — Progress Notes (Addendum)
ID: Paula Navarro OB: July 14, 1951  MR#: [id number]  CSN#:[id number]  PCP: Lanette Hampshire, MD GYN:   SU: Dr Marlou Starks OTHER MD:  CHIEF COMPLAINT:  63 y/o female with bilateral breast cancer undergoing neoadjuvant chemotherapy  BREAST CANCER HISTORY:   Patient developed pain in her breasts and palpated masses. Because of this she went on to have further workup performed including mammograms and ultrasound. On 07/23/2013 patient had bilateral diagnostic mammogram she was noted to have bilateral breast masses on the right irregular mass lying posterior lateral breast associated with a large rounded and dense axillary nodes. The left showed slightly smaller irregular mass lying at the 11:00 to 12:00 position. There was a smaller adjacent cyst. Also noted to have a large dense axillary lymph nodes on the left. Because of this she was recommended ultrasound and biopsies. 08/07/2013 patient underwent ultrasound-guided right breast core needle biopsy as well as left biopsy. 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 The lymph node was positive for metastatic disease. On the left patient had a biopsy that also showed invasive ductal carcinoma grade 3 ER positive PR negative HER-2/neu negative with a proliferation marker Ki-67 80%.Her clinical and radiologic staging was T2 N1 on the left side T2 Nx on the right side.   CURRENT THERAPY:  Neoadjuvant Taxol given weekly  INTERVAL HISTORY:  Paula Navarro is here today for evaluation prior to her first cycle of neoadjuvant weekly Taxol.  She had difficulty sleeping due to being awake all night worrying about her new treatment.  She does c/o hyperpigmentation of her fingernails and dryness of her hands.  She denies fevers, chills, nausea, vomiting, constipation, diarrhea, numbness/tingling, skin changes or any further concerns.    REVIEW OF SYSTEMS: A 10  point review of systems was conducted and is otherwise negative except for what is noted above.    PAST MEDICAL HISTORY: Past Medical History  Diagnosis Date  . Chronic bronchitis   . Palpitation     Tachycardia reported by monitor clerk during a symptomatic spell  . Chest pain     Admitted to [facility] in 09/2011; refused stress test  . Atrial septal defect 1996    Surgical repair in 1996  . Tobacco abuse     60 pack years; 1.5 packs per day  . Anxiety   . Anemia   . Breast cancer     PAST SURGICAL HISTORY: Past Surgical History  Procedure Laterality Date  . Cholecystectomy    . Cesarean section    . Vein bypass surgery    . Breast biopsy Bilateral     FAMILY HISTORY Family History  Problem Relation Age of Onset  . Breast cancer Maternal Aunt     GYNECOLOGIC HISTORY: menarche at age 19, G46, p98, no h/o hormone replacement therapy, no h/o abnormal pap smears, or sexually transmitted infections  SOCIAL HISTORY: widowed, lives with mother and son, retired, enjoys functions at [employer], No ETOH, tobacco, or illicit drug use    ADVANCED DIRECTIVES: not in place   HEALTH MAINTENANCE: History  Substance Use Topics  . Smoking status: Former Smoker -- 1.50 packs/day for 40 years    Types: Cigarettes  . Smokeless tobacco: Never Used  . Alcohol Use: No    Mammogram: 06/2013 Colonoscopy: unknown, unsure Bone Density Scan: never Pap Smear: 2013 Eye Exam: never Vitamin D Level:  never Lipid Panel: never   Allergies  Allergen  Reactions  . Codeine Other (See Comments)    Hot in chest    Current Outpatient Prescriptions  Medication Sig Dispense Refill  . B Complex-C (SUPER B COMPLEX PO) Take 1 tablet by mouth daily.      Marland Kitchen dexamethasone (DECADRON) 4 MG tablet Take 2 tables with a meal two times a day starting the day after chemo for two days.  30 tablet  2  . lidocaine-prilocaine (EMLA) cream Apply 1 application topically as needed.      Marland Kitchen LORazepam (ATIVAN) 1 MG tablet  Take 1 tablet (1 mg total) by mouth every 6 (six) hours as needed for anxiety.  30 tablet  1  . amoxicillin (AMOXIL) 500 MG capsule Take 1 capsule (500 mg total) by mouth 3 (three) times daily.  30 capsule  0  . fluconazole (DIFLUCAN) 200 MG tablet Take 1 tablet (200 mg total) by mouth daily.  7 tablet  2  . potassium chloride SA (K-DUR,KLOR-CON) 20 MEQ tablet Take 1 tablet (20 mEq total) by mouth 2 (two) times daily. For 4 days  8 tablet  1  . prochlorperazine (COMPAZINE) 10 MG tablet TAKE 1 TABLET BY MOUTH EVERY 6 HOURS AS NEEDED FOR NAUSEA AND VOMITING  30 tablet  1  . zolpidem (AMBIEN) 5 MG tablet Take 1 tablet (5 mg total) by mouth at bedtime as needed for sleep.  30 tablet  0   No current facility-administered medications for this visit.    OBJECTIVE: Filed Vitals:   12/16/13 0910  BP: 156/66  Pulse: 69  Temp: 98.2 F (36.8 C)  Resp: 18     Body mass index is 24.61 kg/(m^2).     GENERAL: Patient is a well appearing female in no acute distress HEENT:  Sclerae anicteric.  Oropharynx clear and moist. No ulcerations or evidence of oropharyngeal candidiasis. Neck is supple.  NODES:  No cervical, supraclavicular, or axillary lymphadenopathy palpated.  BREAST EXAM:  Left breast mass  LUNGS:  Clear to auscultation bilaterally.  No wheezes or rhonchi. HEART:  Regular rate and rhythm. No murmur appreciated. ABDOMEN:  Soft, nontender.  Positive, normoactive bowel sounds. No organomegaly palpated. MSK:  No focal spinal tenderness to palpation. Full range of motion bilaterally in the upper extremities. EXTREMITIES:  No peripheral edema.   SKIN:  Clear with no obvious rashes or skin changes. No nail dyscrasia. NEURO:  Nonfocal. Well oriented.  Appropriate affect. ECOG FS:1 - Symptomatic but completely ambulatory  LAB RESULTS:  CMP     Component Value Date/Time   NA 140 12/02/2013 1239   NA 141 09/23/2012 0414   K 3.4* 12/02/2013 1239   K 3.8 09/23/2012 0414   CL 107 09/23/2012 0414   CO2  25 12/02/2013 1239   CO2 29 10/30/2011 1419   GLUCOSE 124 12/02/2013 1239   GLUCOSE 192* 09/23/2012 0414   BUN 7.9 12/02/2013 1239   BUN 7 09/23/2012 0414   CREATININE 0.7 12/02/2013 1239   CREATININE 0.90 09/23/2012 0414   CALCIUM 9.7 12/02/2013 1239   CALCIUM 10.1 10/30/2011 1419   PROT 6.0* 12/02/2013 1239   ALBUMIN 3.5 12/02/2013 1239   AST 22 12/02/2013 1239   ALT 22 12/02/2013 1239   ALKPHOS 143 12/02/2013 1239   BILITOT 0.85 12/02/2013 1239   GFRNONAA >90 10/30/2011 1419   GFRAA >90 10/30/2011 1419    I No results found for this basename: SPEP,  UPEP,   kappa and lambda light chains    Lab Results  Component  Value Date   WBC 9.1 12/16/2013   NEUTROABS 6.3 12/16/2013   HGB 9.5* 12/16/2013   HCT 30.8* 12/16/2013   MCV 74.6* 12/16/2013   PLT 419* 12/16/2013      Chemistry      Component Value Date/Time   NA 140 12/02/2013 1239   NA 141 09/23/2012 0414   K 3.4* 12/02/2013 1239   K 3.8 09/23/2012 0414   CL 107 09/23/2012 0414   CO2 25 12/02/2013 1239   CO2 29 10/30/2011 1419   BUN 7.9 12/02/2013 1239   BUN 7 09/23/2012 0414   CREATININE 0.7 12/02/2013 1239   CREATININE 0.90 09/23/2012 0414      Component Value Date/Time   CALCIUM 9.7 12/02/2013 1239   CALCIUM 10.1 10/30/2011 1419   ALKPHOS 143 12/02/2013 1239   AST 22 12/02/2013 1239   ALT 22 12/02/2013 1239   BILITOT 0.85 12/02/2013 1239       No results found for this basename: LABCA2    No components found with this basename: LABCA125    No results found for this basename: INR,  in the last 168 hours  Urinalysis No results found for this basename: colorurine,  appearanceur,  labspec,  phurine,  glucoseu,  hgbur,  bilirubinur,  ketonesur,  proteinur,  urobilinogen,  nitrite,  leukocytesur    STUDIES: No results found.  ASSESSMENT: 63 y.o. with pathologic right breast T2 N1 stage II, invasive ductal carcinoma, grade III, ER positive, PR negative, Ki-67 20%, HER-2/neu negative, right breast.  1.  Patient did undergo a PET CT on 08/22/2013 that demonstrated  hypermetabolic breast masses, hypermetabolic lymphadenopathy, focal metabolic activity in the posterior right iliac bone, and hypermetabolism within the focus of the lower uterine segment. We will repeat an MRI of the pelvis following chmeotherapy  2. Patient was began on neoadjuvant treatment with chemotherapy consisting of Fluorouracil, Epirubicin, and Cyclophosphamide given on day 1 of a 14 day cycle with neulasta given on day 2 for granulocyte support from 09/15/13 through 11/25/13.  She received 6 cycles.    3.  The patient will now begin weekly Paclitaxel neoadjuvant chemotherapy.    PLAN:   Paula Navarro is doing well today.  We discussed her next chemotherapy regimen which is Paclitaxel weekly, with 12 weeks of treatment planned.  I reviewed the treatment with her in detail.  She does still have a palpable left breast mass, therefore I have ordered a repeat breast MRI.  She was referred back to Dr. Marlou Starks mid-way through her treatment, but didn't go to the appointment.  She has tolerated her treatment thus far relatively well.  Her labs are stable today, and I reviewed those with her in detail. She will proceed with chemotherapy today.  The patient will return in one week for labs, evaluation, and week 2 of Paclitaxel chemotherapy.  We will continue to monitor her closely for any signs of neuropathy.   She knows to call us in the interim for any questions or concerns.  We can certainly see her sooner if needed.  I spent 25 minutes counseling the patient face to face.  The total time spent in the appointment was 30 minutes.  Minette Headland, [place] [phone] 12/16/2013 9:35 AM

## 2013-12-16 NOTE — Patient Instructions (Addendum)
Paclitaxel injection  What is this medicine?  PACLITAXEL (PAK li TAX el) is a chemotherapy drug. It targets fast dividing cells, like cancer cells, and causes these cells to die. This medicine is used to treat ovarian cancer, breast cancer, and other cancers.  This medicine may be used for other purposes; ask your health care provider or pharmacist if you have questions.  COMMON BRAND NAME(S): Onxol , Taxol  What should I tell my health care provider before I take this medicine?  They need to know if you have any of these conditions:  -blood disorders  -irregular heartbeat  -infection (especially a virus infection such as chickenpox, cold sores, or herpes)  -liver disease  -previous or ongoing radiation therapy  -an unusual or allergic reaction to paclitaxel, alcohol, polyoxyethylated castor oil, other chemotherapy agents, other medicines, foods, dyes, or preservatives  -pregnant or trying to get pregnant  -breast-feeding  How should I use this medicine?  This drug is given as an infusion into a vein. It is administered in a hospital or clinic by a specially trained health care professional.  Talk to your pediatrician regarding the use of this medicine in children. Special care may be needed.  Overdosage: If you think you have taken too much of this medicine contact a poison control center or emergency room at once.  NOTE: This medicine is only for you. Do not share this medicine with others.  What if I miss a dose?  It is important not to miss your dose. Call your doctor or health care professional if you are unable to keep an appointment.  What may interact with this medicine?  Do not take this medicine with any of the following medications:  -disulfiram  -metronidazole  This medicine may also interact with the following medications:  -cyclosporine  -diazepam  -ketoconazole  -medicines to increase blood counts like filgrastim, pegfilgrastim, sargramostim  -other chemotherapy drugs like cisplatin, doxorubicin,  epirubicin, etoposide, teniposide, vincristine  -quinidine  -testosterone  -vaccines  -verapamil  Talk to your doctor or health care professional before taking any of these medicines:  -acetaminophen  -aspirin  -ibuprofen  -ketoprofen  -naproxen  This list may not describe all possible interactions. Give your health care provider a list of all the medicines, herbs, non-prescription drugs, or dietary supplements you use. Also tell them if you smoke, drink alcohol, or use illegal drugs. Some items may interact with your medicine.  What should I watch for while using this medicine?  Your condition will be monitored carefully while you are receiving this medicine. You will need important blood work done while you are taking this medicine.  This drug may make you feel generally unwell. This is not uncommon, as chemotherapy can affect healthy cells as well as cancer cells. Report any side effects. Continue your course of treatment even though you feel ill unless your doctor tells you to stop.  In some cases, you may be given additional medicines to help with side effects. Follow all directions for their use.  Call your doctor or health care professional for advice if you get a fever, chills or sore throat, or other symptoms of a cold or flu. Do not treat yourself. This drug decreases your body's ability to fight infections. Try to avoid being around people who are sick.  This medicine may increase your risk to bruise or bleed. Call your doctor or health care professional if you notice any unusual bleeding.  Be careful brushing and flossing your teeth or   fever. Do not become pregnant while taking this medicine.  Women should inform their doctor if they wish to become pregnant or think they might be pregnant. There is a potential for serious side effects to an unborn child. Talk to your health care professional or pharmacist for more information. Do not breast-feed an infant while taking this medicine. Men are advised not to father a child while receiving this medicine. What side effects may I notice from receiving this medicine? Side effects that you should report to your doctor or health care professional as soon as possible: -allergic reactions like skin rash, itching or hives, swelling of the face, lips, or tongue -low blood counts - This drug may decrease the number of white blood cells, red blood cells and platelets. You may be at increased risk for infections and bleeding. -signs of infection - fever or chills, cough, sore throat, pain or difficulty passing urine -signs of decreased platelets or bleeding - bruising, pinpoint red spots on the skin, black, tarry stools, nosebleeds -signs of decreased red blood cells - unusually weak or tired, fainting spells, lightheadedness -breathing problems -chest pain -high or low blood pressure -mouth sores -nausea and vomiting -pain, swelling, redness or irritation at the injection site -pain, tingling, numbness in the hands or feet -slow or irregular heartbeat -swelling of the ankle, feet, hands Side effects that usually do not require medical attention (report to your doctor or health care professional if they continue or are bothersome): -bone pain -complete hair loss including hair on your head, underarms, pubic hair, eyebrows, and eyelashes -changes in the color of fingernails -diarrhea -loosening of the fingernails -loss of appetite -muscle or joint pain -red flush to skin -sweating This list may not describe all possible side effects. Call your doctor for medical advice about side effects. You may report side effects to FDA at  1-800-FDA-1088. Where should I keep my medicine? This drug is given in a hospital or clinic and will not be stored at home. NOTE: This sheet is a summary. It may not cover all possible information. If you have questions about this medicine, talk to your doctor, pharmacist, or health care provider.  2014, [employer]/Gold Standard. (2012-09-09 16:41:21)  [facility] Discharge Instructions for Patients Receiving Chemotherapy  Today you received the following chemotherapy agents :  Taxol.  To help prevent nausea and vomiting after your treatment, we encourage you to take your nausea medication as prescribed by your physician.  Take Compazine 10mg  by mouth every 6 hours as needed for nausea.  DO NOT Drive  After taking this med as it can cause drowsiness.  DO NOT Eat  Greasy  Nor  Spicy  Foods.  DO Drink lots of fluids as tolerated.   If you develop nausea and vomiting that is not controlled by your nausea medication, call the clinic.   BELOW ARE SYMPTOMS THAT SHOULD BE REPORTED IMMEDIATELY:  *FEVER GREATER THAN 100.5 F  *CHILLS WITH OR WITHOUT FEVER  NAUSEA AND VOMITING THAT IS NOT CONTROLLED WITH YOUR NAUSEA MEDICATION  *UNUSUAL SHORTNESS OF BREATH  *UNUSUAL BRUISING OR BLEEDING  TENDERNESS IN MOUTH AND THROAT WITH OR WITHOUT PRESENCE OF ULCERS  *URINARY PROBLEMS  *BOWEL PROBLEMS  UNUSUAL RASH Items with * indicate a potential emergency and should be followed up as soon as possible.  Feel free to call the clinic you have any questions or concerns. The clinic phone number is [phone].

## 2013-12-16 NOTE — Patient Instructions (Signed)
We will get another MRI of your breasts to evaluate your breast cancer.  We will see you back in one week for labs and evaluation.

## 2013-12-16 NOTE — Progress Notes (Signed)
Pt tolerated first time Taxol without difficulty.  Pt was monitored closely during entire chemo infusion.

## 2013-12-17 ENCOUNTER — Telehealth: Payer: Self-pay | Admitting: *Deleted

## 2013-12-17 NOTE — Telephone Encounter (Signed)
Patient called back regarding her appts. I have explained that we needed to separate on two days due to the holiday. She said she would come. She asked to speak with Raquel. I have transferred.  JMW

## 2013-12-17 NOTE — Telephone Encounter (Signed)
Attempted to reach patient to f/u on status since 1st Taxol. No answer and voice mailbox has not been set up yet.

## 2013-12-17 NOTE — Telephone Encounter (Signed)
Patient called and left a message to call her back. I have tried to call her back, but was unable to leave a message.

## 2013-12-18 ENCOUNTER — Telehealth: Payer: Self-pay | Admitting: *Deleted

## 2013-12-18 NOTE — Telephone Encounter (Signed)
UNABLE TO REACH PT. AT THIS TIME.

## 2013-12-18 NOTE — Telephone Encounter (Signed)
Unable to reach patient, "no voicemail set up yet" is the message received.Marland Kitchen

## 2013-12-19 ENCOUNTER — Telehealth: Payer: Self-pay | Admitting: Oncology

## 2013-12-19 NOTE — Telephone Encounter (Signed)
added additional appts for june thru aug. called pt but was not able to lm (vm not set up). pt to get new sdcheudled 5/26. appts for 5/26 adn 5/27 remain the same. added comment to 5/26 appts to send pt for new schedule.

## 2013-12-23 ENCOUNTER — Ambulatory Visit: Payer: Medicare Other | Admitting: Adult Health

## 2013-12-23 ENCOUNTER — Other Ambulatory Visit: Payer: Medicare Other

## 2013-12-23 ENCOUNTER — Telehealth: Payer: Self-pay | Admitting: *Deleted

## 2013-12-23 NOTE — Telephone Encounter (Signed)
Patient called and requesting her appts to be all on the same day. I have transferred her to the desk RN.   Katy Fitch

## 2013-12-24 ENCOUNTER — Ambulatory Visit (HOSPITAL_BASED_OUTPATIENT_CLINIC_OR_DEPARTMENT_OTHER): Payer: Medicare Other

## 2013-12-24 ENCOUNTER — Other Ambulatory Visit: Payer: Self-pay | Admitting: Oncology

## 2013-12-24 ENCOUNTER — Telehealth: Payer: Self-pay | Admitting: *Deleted

## 2013-12-24 ENCOUNTER — Ambulatory Visit (HOSPITAL_BASED_OUTPATIENT_CLINIC_OR_DEPARTMENT_OTHER): Payer: Medicare Other | Admitting: *Deleted

## 2013-12-24 ENCOUNTER — Other Ambulatory Visit: Payer: Self-pay | Admitting: Hematology and Oncology

## 2013-12-24 ENCOUNTER — Ambulatory Visit (HOSPITAL_COMMUNITY)
Admission: RE | Admit: 2013-12-24 | Discharge: 2013-12-24 | Disposition: A | Discharge status: Home / Self Care | Payer: Medicare Other | Source: Ambulatory Visit | Attending: Adult Health | Admitting: Adult Health

## 2013-12-24 VITALS — BP 166/73 | HR 72 | Temp 97.9°F | Resp 18

## 2013-12-24 DIAGNOSIS — C773 Secondary and unspecified malignant neoplasm of axilla and upper limb lymph nodes: Secondary | ICD-10-CM

## 2013-12-24 DIAGNOSIS — C50919 Malignant neoplasm of unspecified site of unspecified female breast: Secondary | ICD-10-CM | POA: Insufficient documentation

## 2013-12-24 DIAGNOSIS — C50911 Malignant neoplasm of unspecified site of right female breast: Secondary | ICD-10-CM

## 2013-12-24 DIAGNOSIS — C50912 Malignant neoplasm of unspecified site of left female breast: Principal | ICD-10-CM

## 2013-12-24 DIAGNOSIS — Z5111 Encounter for antineoplastic chemotherapy: Secondary | ICD-10-CM

## 2013-12-24 LAB — COMPREHENSIVE METABOLIC PANEL (CC13)
ALBUMIN: 3.8 g/dL (ref 3.5–5.0)
ALT: 19 U/L (ref 0–55)
ANION GAP: 10 meq/L (ref 3–11)
AST: 22 U/L (ref 5–34)
Alkaline Phosphatase: 57 U/L (ref 40–150)
BUN: 12 mg/dL (ref 7.0–26.0)
CALCIUM: 9.4 mg/dL (ref 8.4–10.4)
CHLORIDE: 105 meq/L (ref 98–109)
CO2: 25 meq/L (ref 22–29)
Creatinine: 0.7 mg/dL (ref 0.6–1.1)
GLUCOSE: 100 mg/dL (ref 70–140)
POTASSIUM: 4 meq/L (ref 3.5–5.1)
SODIUM: 140 meq/L (ref 136–145)
Total Bilirubin: 0.52 mg/dL (ref 0.20–1.20)
Total Protein: 6.5 g/dL (ref 6.4–8.3)

## 2013-12-24 LAB — CBC WITH DIFFERENTIAL/PLATELET
BASO%: 0.8 % (ref 0.0–2.0)
Basophils Absolute: 0.1 10*3/uL (ref 0.0–0.1)
EOS ABS: 0.3 10*3/uL (ref 0.0–0.5)
EOS%: 2.5 % (ref 0.0–7.0)
HCT: 30.5 % — ABNORMAL LOW (ref 34.8–46.6)
HGB: 9.4 g/dL — ABNORMAL LOW (ref 11.6–15.9)
LYMPH#: 1.6 10*3/uL (ref 0.9–3.3)
LYMPH%: 16.1 % (ref 14.0–49.7)
MCH: 23.3 pg — ABNORMAL LOW (ref 25.1–34.0)
MCHC: 30.9 g/dL — ABNORMAL LOW (ref 31.5–36.0)
MCV: 75.5 fL — ABNORMAL LOW (ref 79.5–101.0)
MONO#: 1 10*3/uL — AB (ref 0.1–0.9)
MONO%: 9.9 % (ref 0.0–14.0)
NEUT%: 70.7 % (ref 38.4–76.8)
NEUTROS ABS: 7.2 10*3/uL — AB (ref 1.5–6.5)
Platelets: 313 10*3/uL (ref 145–400)
RBC: 4.04 10*6/uL (ref 3.70–5.45)
RDW: 19.8 % — AB (ref 11.2–14.5)
WBC: 10.2 10*3/uL (ref 3.9–10.3)

## 2013-12-24 MED ORDER — DEXAMETHASONE SODIUM PHOSPHATE 20 MG/5ML IJ SOLN
INTRAMUSCULAR | Status: AC
  Filled 2013-12-24: qty 5

## 2013-12-24 MED ORDER — FAMOTIDINE IN NACL 20-0.9 MG/50ML-% IV SOLN
INTRAVENOUS | Status: AC
  Filled 2013-12-24: qty 50

## 2013-12-24 MED ORDER — HEPARIN SOD (PORK) LOCK FLUSH 100 UNIT/ML IV SOLN
500.0000 [IU] | Freq: Once | INTRAVENOUS | Status: AC | PRN
  Administered 2013-12-24: 500 [IU]
  Filled 2013-12-24: qty 5

## 2013-12-24 MED ORDER — DEXAMETHASONE SODIUM PHOSPHATE 20 MG/5ML IJ SOLN
20.0000 mg | Freq: Once | INTRAMUSCULAR | Status: AC
  Administered 2013-12-24: 20 mg via INTRAVENOUS

## 2013-12-24 MED ORDER — DIPHENHYDRAMINE HCL 50 MG/ML IJ SOLN
25.0000 mg | Freq: Once | INTRAMUSCULAR | Status: AC
  Administered 2013-12-24: 25 mg via INTRAVENOUS

## 2013-12-24 MED ORDER — SODIUM CHLORIDE 0.9 % IV SOLN
Freq: Once | INTRAVENOUS | Status: AC
  Administered 2013-12-24: 16:00:00 via INTRAVENOUS

## 2013-12-24 MED ORDER — SODIUM CHLORIDE 0.9 % IJ SOLN
10.0000 mL | INTRAMUSCULAR | Status: DC | PRN
  Administered 2013-12-24: 10 mL
  Filled 2013-12-24: qty 10

## 2013-12-24 MED ORDER — PACLITAXEL CHEMO INJECTION 300 MG/50ML
80.0000 mg/m2 | Freq: Once | INTRAVENOUS | Status: AC
  Administered 2013-12-24: 126 mg via INTRAVENOUS
  Filled 2013-12-24: qty 21

## 2013-12-24 MED ORDER — DIPHENHYDRAMINE HCL 50 MG/ML IJ SOLN
INTRAMUSCULAR | Status: AC
  Filled 2013-12-24: qty 1

## 2013-12-24 MED ORDER — GADOBENATE DIMEGLUMINE 529 MG/ML IV SOLN
15.0000 mL | Freq: Once | INTRAVENOUS | Status: AC | PRN
  Administered 2013-12-24: 12 mL via INTRAVENOUS

## 2013-12-24 MED ORDER — FAMOTIDINE IN NACL 20-0.9 MG/50ML-% IV SOLN
20.0000 mg | Freq: Once | INTRAVENOUS | Status: AC
Start: 2013-12-24 — End: 2013-12-24
  Administered 2013-12-24: 20 mg via INTRAVENOUS

## 2013-12-24 MED ORDER — ONDANSETRON 8 MG/50ML IVPB (CHCC)
8.0000 mg | Freq: Once | INTRAVENOUS | Status: AC
  Administered 2013-12-24: 8 mg via INTRAVENOUS

## 2013-12-24 NOTE — Telephone Encounter (Signed)
MRI department called reporting patient has IV that was placed today for their test.  Patient would like to remain accessed due to coming to [facility] for treatment.  Informed staff to leave accessed.  Called infusion room staff to notify of patient having IV access.

## 2013-12-24 NOTE — Patient Instructions (Signed)
[facility] Discharge Instructions for Patients Receiving Chemotherapy  Today you received the following chemotherapy agents Paclitaxel.  To help prevent nausea and vomiting after your treatment, we encourage you to take your nausea medication as directed.    If you develop nausea and vomiting that is not controlled by your nausea medication, call the clinic.   BELOW ARE SYMPTOMS THAT SHOULD BE REPORTED IMMEDIATELY:  *FEVER GREATER THAN 100.5 F  *CHILLS WITH OR WITHOUT FEVER  NAUSEA AND VOMITING THAT IS NOT CONTROLLED WITH YOUR NAUSEA MEDICATION  *UNUSUAL SHORTNESS OF BREATH  *UNUSUAL BRUISING OR BLEEDING  TENDERNESS IN MOUTH AND THROAT WITH OR WITHOUT PRESENCE OF ULCERS  *URINARY PROBLEMS  *BOWEL PROBLEMS  UNUSUAL RASH Items with * indicate a potential emergency and should be followed up as soon as possible.  Feel free to call the clinic you have any questions or concerns. The clinic phone number is [phone].

## 2013-12-26 ENCOUNTER — Other Ambulatory Visit: Payer: Self-pay | Admitting: *Deleted

## 2013-12-26 DIAGNOSIS — C50912 Malignant neoplasm of unspecified site of left female breast: Principal | ICD-10-CM

## 2013-12-26 DIAGNOSIS — C50911 Malignant neoplasm of unspecified site of right female breast: Secondary | ICD-10-CM

## 2013-12-26 MED ORDER — LORAZEPAM 1 MG PO TABS: 1.0000 mg | ORAL_TABLET | Freq: Four times a day (QID) | ORAL | Status: DC | PRN

## 2013-12-30 ENCOUNTER — Encounter: Payer: Self-pay | Admitting: Oncology

## 2013-12-30 ENCOUNTER — Ambulatory Visit: Payer: Medicare Other | Admitting: Oncology

## 2013-12-30 ENCOUNTER — Ambulatory Visit: Payer: Medicare Other

## 2013-12-30 ENCOUNTER — Ambulatory Visit (HOSPITAL_BASED_OUTPATIENT_CLINIC_OR_DEPARTMENT_OTHER): Payer: Medicare Other

## 2013-12-30 ENCOUNTER — Ambulatory Visit (HOSPITAL_BASED_OUTPATIENT_CLINIC_OR_DEPARTMENT_OTHER): Payer: Medicare Other | Admitting: Oncology

## 2013-12-30 ENCOUNTER — Other Ambulatory Visit (HOSPITAL_BASED_OUTPATIENT_CLINIC_OR_DEPARTMENT_OTHER): Payer: Medicare Other

## 2013-12-30 ENCOUNTER — Other Ambulatory Visit: Payer: Medicare Other

## 2013-12-30 VITALS — BP 127/71 | HR 75 | Temp 98.9°F | Resp 18 | Ht 60.0 in | Wt 124.9 lb

## 2013-12-30 DIAGNOSIS — C50919 Malignant neoplasm of unspecified site of unspecified female breast: Secondary | ICD-10-CM

## 2013-12-30 DIAGNOSIS — Z5111 Encounter for antineoplastic chemotherapy: Secondary | ICD-10-CM

## 2013-12-30 DIAGNOSIS — C50912 Malignant neoplasm of unspecified site of left female breast: Principal | ICD-10-CM

## 2013-12-30 DIAGNOSIS — C50911 Malignant neoplasm of unspecified site of right female breast: Secondary | ICD-10-CM

## 2013-12-30 DIAGNOSIS — B370 Candidal stomatitis: Secondary | ICD-10-CM

## 2013-12-30 DIAGNOSIS — C773 Secondary and unspecified malignant neoplasm of axilla and upper limb lymph nodes: Secondary | ICD-10-CM

## 2013-12-30 LAB — CBC WITH DIFFERENTIAL/PLATELET
BASO%: 0.6 % (ref 0.0–2.0)
Basophils Absolute: 0.1 10*3/uL (ref 0.0–0.1)
EOS%: 2.6 % (ref 0.0–7.0)
Eosinophils Absolute: 0.3 10*3/uL (ref 0.0–0.5)
HCT: 31 % — ABNORMAL LOW (ref 34.8–46.6)
HGB: 9.6 g/dL — ABNORMAL LOW (ref 11.6–15.9)
LYMPH#: 1.1 10*3/uL (ref 0.9–3.3)
LYMPH%: 10.2 % — ABNORMAL LOW (ref 14.0–49.7)
MCH: 23.4 pg — ABNORMAL LOW (ref 25.1–34.0)
MCHC: 31.1 g/dL — AB (ref 31.5–36.0)
MCV: 75.3 fL — ABNORMAL LOW (ref 79.5–101.0)
MONO#: 0.3 10*3/uL (ref 0.1–0.9)
MONO%: 3.2 % (ref 0.0–14.0)
NEUT#: 8.7 10*3/uL — ABNORMAL HIGH (ref 1.5–6.5)
NEUT%: 83.4 % — ABNORMAL HIGH (ref 38.4–76.8)
Platelets: 214 10*3/uL (ref 145–400)
RBC: 4.11 10*6/uL (ref 3.70–5.45)
RDW: 18.7 % — ABNORMAL HIGH (ref 11.2–14.5)
WBC: 10.4 10*3/uL — AB (ref 3.9–10.3)

## 2013-12-30 LAB — COMPREHENSIVE METABOLIC PANEL (CC13)
ALT: 17 U/L (ref 0–55)
ANION GAP: 14 meq/L — AB (ref 3–11)
AST: 17 U/L (ref 5–34)
Albumin: 3.5 g/dL (ref 3.5–5.0)
Alkaline Phosphatase: 49 U/L (ref 40–150)
BILIRUBIN TOTAL: 1.14 mg/dL (ref 0.20–1.20)
BUN: 11.2 mg/dL (ref 7.0–26.0)
CALCIUM: 9.3 mg/dL (ref 8.4–10.4)
CHLORIDE: 105 meq/L (ref 98–109)
CO2: 23 mEq/L (ref 22–29)
Creatinine: 0.7 mg/dL (ref 0.6–1.1)
Glucose: 109 mg/dl (ref 70–140)
Potassium: 3.5 mEq/L (ref 3.5–5.1)
Sodium: 142 mEq/L (ref 136–145)
Total Protein: 6.1 g/dL — ABNORMAL LOW (ref 6.4–8.3)

## 2013-12-30 MED ORDER — DIPHENHYDRAMINE HCL 50 MG/ML IJ SOLN
25.0000 mg | Freq: Once | INTRAMUSCULAR | Status: AC
  Administered 2013-12-30: 25 mg via INTRAVENOUS

## 2013-12-30 MED ORDER — DEXAMETHASONE SODIUM PHOSPHATE 20 MG/5ML IJ SOLN
INTRAMUSCULAR | Status: AC
  Filled 2013-12-30: qty 5

## 2013-12-30 MED ORDER — PACLITAXEL CHEMO INJECTION 300 MG/50ML
80.0000 mg/m2 | Freq: Once | INTRAVENOUS | Status: AC
  Administered 2013-12-30: 126 mg via INTRAVENOUS
  Filled 2013-12-30: qty 21

## 2013-12-30 MED ORDER — ONDANSETRON 8 MG/50ML IVPB (CHCC)
8.0000 mg | Freq: Once | INTRAVENOUS | Status: AC
  Administered 2013-12-30: 8 mg via INTRAVENOUS

## 2013-12-30 MED ORDER — SODIUM CHLORIDE 0.9 % IJ SOLN
10.0000 mL | INTRAMUSCULAR | Status: DC | PRN
  Administered 2013-12-30: 10 mL
  Filled 2013-12-30: qty 10

## 2013-12-30 MED ORDER — ONDANSETRON 8 MG/NS 50 ML IVPB
INTRAVENOUS | Status: AC
  Filled 2013-12-30: qty 8

## 2013-12-30 MED ORDER — FAMOTIDINE IN NACL 20-0.9 MG/50ML-% IV SOLN
INTRAVENOUS | Status: AC
  Filled 2013-12-30: qty 50

## 2013-12-30 MED ORDER — HEPARIN SOD (PORK) LOCK FLUSH 100 UNIT/ML IV SOLN
500.0000 [IU] | Freq: Once | INTRAVENOUS | Status: AC | PRN
  Administered 2013-12-30: 500 [IU]
  Filled 2013-12-30: qty 5

## 2013-12-30 MED ORDER — DIPHENHYDRAMINE HCL 50 MG/ML IJ SOLN
INTRAMUSCULAR | Status: AC
  Filled 2013-12-30: qty 1

## 2013-12-30 MED ORDER — CLOBETASOL PROPIONATE 0.05 % EX CREA: 1.0000 "application " | TOPICAL_CREAM | Freq: Two times a day (BID) | CUTANEOUS | Status: DC

## 2013-12-30 MED ORDER — FAMOTIDINE IN NACL 20-0.9 MG/50ML-% IV SOLN
20.0000 mg | Freq: Once | INTRAVENOUS | Status: AC
  Administered 2013-12-30: 20 mg via INTRAVENOUS

## 2013-12-30 MED ORDER — SODIUM CHLORIDE 0.9 % IV SOLN
Freq: Once | INTRAVENOUS | Status: AC
  Administered 2013-12-30: 11:00:00 via INTRAVENOUS

## 2013-12-30 MED ORDER — FLUCONAZOLE 100 MG PO TABS: 100.0000 mg | ORAL_TABLET | Freq: Every day | ORAL | Status: DC

## 2013-12-30 MED ORDER — DEXAMETHASONE SODIUM PHOSPHATE 20 MG/5ML IJ SOLN
20.0000 mg | Freq: Once | INTRAMUSCULAR | Status: AC
  Administered 2013-12-30: 20 mg via INTRAVENOUS

## 2013-12-30 NOTE — Progress Notes (Signed)
ID: Paula Navarro OB: 1951-07-16  MR#: [id number]  CSN#:[id number]  PCP: Lanette Hampshire, MD GYN:   SU: Dr Marlou Starks OTHER MD:  CHIEF COMPLAINT:  63 y/o female with bilateral breast cancer undergoing neoadjuvant chemotherapy  BREAST CANCER HISTORY:   Patient developed pain in her breasts and palpated masses. Because of this she went on to have further workup performed including mammograms and ultrasound. On 07/23/2013 patient had bilateral diagnostic mammogram she was noted to have bilateral breast masses on the right irregular mass lying posterior lateral breast associated with a large rounded and dense axillary nodes. The left showed slightly smaller irregular mass lying at the 11:00 to 12:00 position. There was a smaller adjacent cyst. Also noted to have a large dense axillary lymph nodes on the left. Because of this she was recommended ultrasound and biopsies. 08/07/2013 patient underwent ultrasound-guided right breast core needle biopsy as well as left biopsy. 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 The lymph node was positive for metastatic disease. On the left patient had a biopsy that also showed invasive ductal carcinoma grade 3 ER positive PR negative HER-2/neu negative with a proliferation marker Ki-67 80%.Her clinical and radiologic staging was T2 N1 on the left side T2 Nx on the right side.   CURRENT THERAPY:  Neoadjuvant Taxol given weekly  INTERVAL HISTORY:  [name] is here today for evaluation prior to her third cycle of neoadjuvant weekly Taxol.  Tolerating well. Denies neuropathy. She does c/o hyperpigmentation of her fingernails and dryness of her hands.  Wants cream for this. Thinks she has thrush again. She denies fevers, chills, nausea, vomiting, constipation, diarrhea, numbness/tingling, skin changes or any further concerns.    REVIEW OF SYSTEMS: A 10 point  review of systems was conducted and is otherwise negative except for what is noted above.    PAST MEDICAL HISTORY: Past Medical History  Diagnosis Date  . Chronic bronchitis   . Palpitation     Tachycardia reported by monitor clerk during a symptomatic spell  . Chest pain     Admitted to [facility] in 09/2011; refused stress test  . Atrial septal defect 1996    Surgical repair in 1996  . Tobacco abuse     60 pack years; 1.5 packs per day  . Anxiety   . Anemia   . Breast cancer     PAST SURGICAL HISTORY: Past Surgical History  Procedure Laterality Date  . Cholecystectomy    . Cesarean section    . Vein bypass surgery    . Breast biopsy Bilateral     FAMILY HISTORY Family History  Problem Relation Age of Onset  . Breast cancer Maternal Aunt     GYNECOLOGIC HISTORY: menarche at age 40, G10, p80, no h/o hormone replacement therapy, no h/o abnormal pap smears, or sexually transmitted infections  SOCIAL HISTORY: widowed, lives with mother and son, retired, enjoys functions at [employer], No ETOH, tobacco, or illicit drug use    ADVANCED DIRECTIVES: not in place   HEALTH MAINTENANCE: History  Substance Use Topics  . Smoking status: Former Smoker -- 1.50 packs/day for 40 years    Types: Cigarettes  . Smokeless tobacco: Never Used  . Alcohol Use: No    Mammogram: 06/2013 Colonoscopy: unknown, unsure Bone Density Scan: never Pap Smear: 2013 Eye Exam: never Vitamin D Level:  never Lipid Panel: never   Allergies  Allergen Reactions  .  Codeine Other (See Comments)    Hot in chest    Current Outpatient Prescriptions  Medication Sig Dispense Refill  . amoxicillin (AMOXIL) 500 MG capsule Take 1 capsule (500 mg total) by mouth 3 (three) times daily.  30 capsule  0  . B Complex-C (SUPER B COMPLEX PO) Take 1 tablet by mouth daily.      . clobetasol cream (TEMOVATE) 7.98 % Apply 1 application topically 2 (two) times daily. Apply to hands and feet twice a day as needed.  30 g  1   . dexamethasone (DECADRON) 4 MG tablet Take 2 tables with a meal two times a day starting the day after chemo for two days.  30 tablet  2  . fluconazole (DIFLUCAN) 100 MG tablet Take 1 tablet (100 mg total) by mouth daily.  7 tablet  0  . lidocaine-prilocaine (EMLA) cream Apply 1 application topically as needed.      Marland Kitchen LORazepam (ATIVAN) 1 MG tablet Take 1 tablet (1 mg total) by mouth every 6 (six) hours as needed for anxiety.  30 tablet  1  . potassium chloride SA (K-DUR,KLOR-CON) 20 MEQ tablet Take 1 tablet (20 mEq total) by mouth 2 (two) times daily. For 4 days  8 tablet  1  . prochlorperazine (COMPAZINE) 10 MG tablet TAKE 1 TABLET BY MOUTH EVERY 6 HOURS AS NEEDED FOR NAUSEA AND VOMITING  30 tablet  1  . zolpidem (AMBIEN) 5 MG tablet Take 1 tablet (5 mg total) by mouth at bedtime as needed for sleep.  30 tablet  0   No current facility-administered medications for this visit.    OBJECTIVE: Filed Vitals:   12/30/13 0956  BP: 127/71  Pulse: 75  Temp: 98.9 F (37.2 C)  Resp: 18     Body mass index is 24.39 kg/(m^2).     GENERAL: Patient is a well appearing female in no acute distress HEENT:  Sclerae anicteric.oropharyngeal candidiasis on tongue. Neck is supple.  NODES:  No cervical, supraclavicular, or axillary lymphadenopathy palpated.  BREAST EXAM:  Left breast mass  LUNGS:  Clear to auscultation bilaterally.  No wheezes or rhonchi. HEART:  Regular rate and rhythm. No murmur appreciated. ABDOMEN:  Soft, nontender.  Positive, normoactive bowel sounds. No organomegaly palpated. MSK:  No focal spinal tenderness to palpation. Full range of motion bilaterally in the upper extremities. EXTREMITIES:  No peripheral edema.   SKIN:  Clear with no obvious rashes or skin changes. No nail dyscrasia. NEURO:  Nonfocal. Well oriented.  Appropriate affect. ECOG FS:1 - Symptomatic but completely ambulatory  LAB RESULTS:  CMP     Component Value Date/Time   NA 140 12/24/2013 1418   NA 141  09/23/2012 0414   K 4.0 12/24/2013 1418   K 3.8 09/23/2012 0414   CL 107 09/23/2012 0414   CO2 25 12/24/2013 1418   CO2 29 10/30/2011 1419   GLUCOSE 100 12/24/2013 1418   GLUCOSE 192* 09/23/2012 0414   BUN 12.0 12/24/2013 1418   BUN 7 09/23/2012 0414   CREATININE 0.7 12/24/2013 1418   CREATININE 0.90 09/23/2012 0414   CALCIUM 9.4 12/24/2013 1418   CALCIUM 10.1 10/30/2011 1419   PROT 6.5 12/24/2013 1418   ALBUMIN 3.8 12/24/2013 1418   AST 22 12/24/2013 1418   ALT 19 12/24/2013 1418   ALKPHOS 57 12/24/2013 1418   BILITOT 0.52 12/24/2013 1418   GFRNONAA >90 10/30/2011 1419   GFRAA >90 10/30/2011 1419    I No results found for this  basename: SPEP,  UPEP,   kappa and lambda light chains    Lab Results  Component Value Date   WBC 10.4* 12/30/2013   NEUTROABS 8.7* 12/30/2013   HGB 9.6* 12/30/2013   HCT 31.0* 12/30/2013   MCV 75.3* 12/30/2013   PLT 214 12/30/2013      Chemistry      Component Value Date/Time   NA 140 12/24/2013 1418   NA 141 09/23/2012 0414   K 4.0 12/24/2013 1418   K 3.8 09/23/2012 0414   CL 107 09/23/2012 0414   CO2 25 12/24/2013 1418   CO2 29 10/30/2011 1419   BUN 12.0 12/24/2013 1418   BUN 7 09/23/2012 0414   CREATININE 0.7 12/24/2013 1418   CREATININE 0.90 09/23/2012 0414      Component Value Date/Time   CALCIUM 9.4 12/24/2013 1418   CALCIUM 10.1 10/30/2011 1419   ALKPHOS 57 12/24/2013 1418   AST 22 12/24/2013 1418   ALT 19 12/24/2013 1418   BILITOT 0.52 12/24/2013 1418       No results found for this basename: LABCA2    No components found with this basename: LABCA125    No results found for this basename: INR,  in the last 168 hours  Urinalysis No results found for this basename: colorurine,  appearanceur,  labspec,  phurine,  glucoseu,  hgbur,  bilirubinur,  ketonesur,  proteinur,  urobilinogen,  nitrite,  leukocytesur    STUDIES: EXAM:  BILATERAL BREAST MRI WITH AND WITHOUT CONTRAST  TECHNIQUE:  Multiplanar, multisequence MR images of both breasts were obtained  prior to and  following the intravenous administration of 14m of  MultiHance.  THREE-DIMENSIONAL MR IMAGE RENDERING ON INDEPENDENT WORKSTATION:  Three-dimensional MR images were rendered by post-processing of the  original MR data on an independent workstation. The  three-dimensional MR images were interpreted, and findings are  reported in the following complete MRI report for this study. Three  dimensional images were evaluated at the independent DynaCad  workstation  COMPARISON: Previous exams  FINDINGS:  Breast composition: c: Heterogeneous fibroglandular tissue  Background parenchymal enhancement: Minimal to mild.  Right breast: Marked interval decrease in size of the irregular  enhancing mass in the central to upper right breast posterior depth,  now measuring 1.9 cm AP, 1.1 cm transverse, and 1.1 cm craniocaudal,  previously measured 4 x 4 x 5 cm. An additional site of non mass  enhancement in the outer right measures 1.1 x 0.4 cm. Multiple  previously seen additional enhancing nodules are no longer  identified.  Left breast: The dominant enhancing mass in the upper left breast at  12 o'clock containing biopsy clip artifact centrally now measures  2.6 cm AP, 2.2 cm transverse, and 2.3 cm craniocaudal, previously  measured 4.5 x 4 x 4.5 cm. The previously seen smaller enhancing  masses and areas of non mass enhancement in the left breast have  near resolved with a few residual small foci seen predominantly  along the anterior margin of the dominant mass in the left breast.  Lymph nodes: Interval decrease in size of bulky bilateral axillary  lymphadenopathy with 2 adjacent right axillary lymph nodes now  measuring 1.8 x 0.9 cm axially, previously 4.1 x 2.3 cm. A left  axillary lymph node now measures 1.1 x 0.8 cm, previously 2.0 x 1.6  cm. .  Ancillary findings: A Port-A-Cath is present in the right upper  chest. There is cardiomegaly. The central pulmonary arteries appear  enlarged. There  is blooming  artifact related to median sternotomy  wires.  IMPRESSION:  Findings compatible with treatment response with interval decrease  in size of the dominant masses in each breast, with near resolution  of the surrounding satellite nodules and areas of non mass  enhancement, as well as decrease in size of axillary  lymphadenopathy.  RECOMMENDATION:  Treatment of known bilateral breast cancers with axillary lymph node  metastases.  BI-RADS CATEGORY 6: Known biopsy-proven malignancy.  Electronically Signed  By: Everlean Alstrom M.D.  On: 12/24/2013 15:18   ASSESSMENT: 63 y.o. with pathologic right breast T2 N1 stage II, invasive ductal carcinoma, grade III, ER positive, PR negative, Ki-67 20%, HER-2/neu negative, right breast.  1.  Patient did undergo a PET CT on 08/22/2013 that demonstrated hypermetabolic breast masses, hypermetabolic lymphadenopathy, focal metabolic activity in the posterior right iliac bone, and hypermetabolism within the focus of the lower uterine segment. We will repeat an MRI of the pelvis following chmeotherapy  2. Patient was began on neoadjuvant treatment with chemotherapy consisting of Fluorouracil, Epirubicin, and Cyclophosphamide given on day 1 of a 14 day cycle with neulasta given on day 2 for granulocyte support from 09/15/13 through 11/25/13.  She received 6 cycles.    3.  The patient is on weekly Paclitaxel neoadjuvant chemotherapy started on 12/16/13.    4. Oral candidiasis.   PLAN:   Ms. Kutsch is doing well today.  MRI reviewed. Shows response the therapy. Recommend that she proceed with cycle 3 of weekly Taxol today.   She was referred back to Dr. Marlou Starks mid-way through her treatment, but didn't go to the appointment.  She plans to reschedule with him.  I have prescribed Fluconazole for 7 days for her thrush. She may use clobetasol cream for her hands and feet.   The patient will return in one week for labs, evaluation, and week 4 of Paclitaxel  chemotherapy.  We will continue to monitor her closely for any signs of neuropathy.   She knows to call us in the interim for any questions or concerns.  We can certainly see her sooner if needed.  I spent 25 minutes counseling the patient face to face.  The total time spent in the appointment was 30 minutes.  Mikey Bussing, DNP, AGPCNP-BC  12/30/2013 10:36 AM

## 2013-12-30 NOTE — Patient Instructions (Signed)
[place] Discharge Instructions for Patients Receiving Chemotherapy  Today you received the following chemotherapy agents Paclitaxel. To help prevent nausea and vomiting after your treatment, we encourage you to take your nausea medication as directed.    If you develop nausea and vomiting that is not controlled by your nausea medication, call the clinic.   BELOW ARE SYMPTOMS THAT SHOULD BE REPORTED IMMEDIATELY:  *FEVER GREATER THAN 100.5 F  *CHILLS WITH OR WITHOUT FEVER  NAUSEA AND VOMITING THAT IS NOT CONTROLLED WITH YOUR NAUSEA MEDICATION  *UNUSUAL SHORTNESS OF BREATH  *UNUSUAL BRUISING OR BLEEDING  TENDERNESS IN MOUTH AND THROAT WITH OR WITHOUT PRESENCE OF ULCERS  *URINARY PROBLEMS  *BOWEL PROBLEMS  UNUSUAL RASH Items with * indicate a potential emergency and should be followed up as soon as possible.  Feel free to call the clinic you have any questions or concerns. The clinic phone number is [phone].

## 2014-01-06 ENCOUNTER — Other Ambulatory Visit (HOSPITAL_BASED_OUTPATIENT_CLINIC_OR_DEPARTMENT_OTHER): Payer: Medicare Other

## 2014-01-06 ENCOUNTER — Encounter: Payer: Self-pay | Admitting: Oncology

## 2014-01-06 ENCOUNTER — Ambulatory Visit (HOSPITAL_BASED_OUTPATIENT_CLINIC_OR_DEPARTMENT_OTHER): Payer: Medicare Other

## 2014-01-06 ENCOUNTER — Ambulatory Visit (HOSPITAL_BASED_OUTPATIENT_CLINIC_OR_DEPARTMENT_OTHER): Payer: Medicare Other | Admitting: Oncology

## 2014-01-06 VITALS — BP 108/84 | HR 69 | Temp 98.8°F | Resp 18

## 2014-01-06 VITALS — BP 117/77 | HR 79 | Temp 97.0°F | Resp 20 | Ht 60.0 in | Wt 123.5 lb

## 2014-01-06 DIAGNOSIS — C50911 Malignant neoplasm of unspecified site of right female breast: Secondary | ICD-10-CM

## 2014-01-06 DIAGNOSIS — C50919 Malignant neoplasm of unspecified site of unspecified female breast: Secondary | ICD-10-CM

## 2014-01-06 DIAGNOSIS — C773 Secondary and unspecified malignant neoplasm of axilla and upper limb lymph nodes: Secondary | ICD-10-CM

## 2014-01-06 DIAGNOSIS — Z170 Estrogen receptor positive status [ER+]: Secondary | ICD-10-CM

## 2014-01-06 DIAGNOSIS — C50912 Malignant neoplasm of unspecified site of left female breast: Principal | ICD-10-CM

## 2014-01-06 DIAGNOSIS — Z5111 Encounter for antineoplastic chemotherapy: Secondary | ICD-10-CM

## 2014-01-06 LAB — CBC WITH DIFFERENTIAL/PLATELET
BASO%: 0.5 % (ref 0.0–2.0)
BASOS ABS: 0 10*3/uL (ref 0.0–0.1)
EOS%: 3.6 % (ref 0.0–7.0)
Eosinophils Absolute: 0.2 10*3/uL (ref 0.0–0.5)
HEMATOCRIT: 30.6 % — AB (ref 34.8–46.6)
HEMOGLOBIN: 9.4 g/dL — AB (ref 11.6–15.9)
LYMPH%: 17.2 % (ref 14.0–49.7)
MCH: 23.3 pg — ABNORMAL LOW (ref 25.1–34.0)
MCHC: 30.5 g/dL — AB (ref 31.5–36.0)
MCV: 76.4 fL — AB (ref 79.5–101.0)
MONO#: 0.3 10*3/uL (ref 0.1–0.9)
MONO%: 6.1 % (ref 0.0–14.0)
NEUT#: 3.9 10*3/uL (ref 1.5–6.5)
NEUT%: 72.6 % (ref 38.4–76.8)
Platelets: 180 10*3/uL (ref 145–400)
RBC: 4.01 10*6/uL (ref 3.70–5.45)
RDW: 17.5 % — ABNORMAL HIGH (ref 11.2–14.5)
WBC: 5.4 10*3/uL (ref 3.9–10.3)
lymph#: 0.9 10*3/uL (ref 0.9–3.3)

## 2014-01-06 LAB — COMPREHENSIVE METABOLIC PANEL (CC13)
ALK PHOS: 46 U/L (ref 40–150)
ALT: 23 U/L (ref 0–55)
AST: 20 U/L (ref 5–34)
Albumin: 3.3 g/dL — ABNORMAL LOW (ref 3.5–5.0)
Anion Gap: 11 mEq/L (ref 3–11)
BILIRUBIN TOTAL: 0.56 mg/dL (ref 0.20–1.20)
BUN: 7.6 mg/dL (ref 7.0–26.0)
CO2: 26 mEq/L (ref 22–29)
CREATININE: 0.7 mg/dL (ref 0.6–1.1)
Calcium: 9.2 mg/dL (ref 8.4–10.4)
Chloride: 104 mEq/L (ref 98–109)
Glucose: 137 mg/dl (ref 70–140)
Potassium: 3.7 mEq/L (ref 3.5–5.1)
Sodium: 141 mEq/L (ref 136–145)
Total Protein: 5.8 g/dL — ABNORMAL LOW (ref 6.4–8.3)

## 2014-01-06 MED ORDER — FAMOTIDINE IN NACL 20-0.9 MG/50ML-% IV SOLN
20.0000 mg | Freq: Once | INTRAVENOUS | Status: AC
  Administered 2014-01-06: 20 mg via INTRAVENOUS

## 2014-01-06 MED ORDER — DEXAMETHASONE SODIUM PHOSPHATE 20 MG/5ML IJ SOLN
INTRAMUSCULAR | Status: AC
  Filled 2014-01-06: qty 5

## 2014-01-06 MED ORDER — PACLITAXEL CHEMO INJECTION 300 MG/50ML
80.0000 mg/m2 | Freq: Once | INTRAVENOUS | Status: AC
  Administered 2014-01-06: 126 mg via INTRAVENOUS
  Filled 2014-01-06: qty 21

## 2014-01-06 MED ORDER — FAMOTIDINE IN NACL 20-0.9 MG/50ML-% IV SOLN
INTRAVENOUS | Status: AC
  Filled 2014-01-06: qty 50

## 2014-01-06 MED ORDER — ONDANSETRON 8 MG/NS 50 ML IVPB
INTRAVENOUS | Status: AC
  Filled 2014-01-06: qty 8

## 2014-01-06 MED ORDER — DIPHENHYDRAMINE HCL 50 MG/ML IJ SOLN
INTRAMUSCULAR | Status: AC
  Filled 2014-01-06: qty 1

## 2014-01-06 MED ORDER — DEXAMETHASONE SODIUM PHOSPHATE 20 MG/5ML IJ SOLN
20.0000 mg | Freq: Once | INTRAMUSCULAR | Status: AC
  Administered 2014-01-06: 20 mg via INTRAVENOUS

## 2014-01-06 MED ORDER — SODIUM CHLORIDE 0.9 % IJ SOLN
10.0000 mL | INTRAMUSCULAR | Status: DC | PRN
  Administered 2014-01-06: 10 mL
  Filled 2014-01-06: qty 10

## 2014-01-06 MED ORDER — ONDANSETRON 8 MG/50ML IVPB (CHCC)
8.0000 mg | Freq: Once | INTRAVENOUS | Status: AC
  Administered 2014-01-06: 8 mg via INTRAVENOUS

## 2014-01-06 MED ORDER — SODIUM CHLORIDE 0.9 % IV SOLN
Freq: Once | INTRAVENOUS | Status: AC
  Administered 2014-01-06: 11:00:00 via INTRAVENOUS

## 2014-01-06 MED ORDER — HEPARIN SOD (PORK) LOCK FLUSH 100 UNIT/ML IV SOLN
500.0000 [IU] | Freq: Once | INTRAVENOUS | Status: AC | PRN
  Administered 2014-01-06: 500 [IU]
  Filled 2014-01-06: qty 5

## 2014-01-06 MED ORDER — DIPHENHYDRAMINE HCL 50 MG/ML IJ SOLN
25.0000 mg | Freq: Once | INTRAMUSCULAR | Status: AC
  Administered 2014-01-06: 25 mg via INTRAVENOUS

## 2014-01-06 NOTE — Patient Instructions (Signed)
[facility] Discharge Instructions for Patients Receiving Chemotherapy  Today you received the following chemotherapy agents TAXOL  To help prevent nausea and vomiting after your treatment, we encourage you to take your nausea medication IF NEEDED   If you develop nausea and vomiting that is not controlled by your nausea medication, call the clinic.   BELOW ARE SYMPTOMS THAT SHOULD BE REPORTED IMMEDIATELY:  *FEVER GREATER THAN 100.5 F  *CHILLS WITH OR WITHOUT FEVER  NAUSEA AND VOMITING THAT IS NOT CONTROLLED WITH YOUR NAUSEA MEDICATION  *UNUSUAL SHORTNESS OF BREATH  *UNUSUAL BRUISING OR BLEEDING  TENDERNESS IN MOUTH AND THROAT WITH OR WITHOUT PRESENCE OF ULCERS  *URINARY PROBLEMS  *BOWEL PROBLEMS  UNUSUAL RASH Items with * indicate a potential emergency and should be followed up as soon as possible.  Feel free to call the clinic you have any questions or concerns. The clinic phone number is [phone].

## 2014-01-06 NOTE — Progress Notes (Signed)
ID: Paula Navarro OB: Apr 08, 1951  MR#: [id number]  CSN#:[id number]  PCP: Lanette Hampshire, MD GYN:   SU: Dr Marlou Starks OTHER MD:  CHIEF COMPLAINT:  63 y/o female with bilateral breast cancer undergoing neoadjuvant chemotherapy  BREAST CANCER HISTORY:   Patient developed pain in her breasts and palpated masses. Because of this she went on to have further workup performed including mammograms and ultrasound. On 07/23/2013 patient had bilateral diagnostic mammogram she was noted to have bilateral breast masses on the right irregular mass lying posterior lateral breast associated with a large rounded and dense axillary nodes. The left showed slightly smaller irregular mass lying at the 11:00 to 12:00 position. There was a smaller adjacent cyst. Also noted to have a large dense axillary lymph nodes on the left. Because of this she was recommended ultrasound and biopsies. 08/07/2013 patient underwent ultrasound-guided right breast core needle biopsy as well as left biopsy. 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 The lymph node was positive for metastatic disease. On the left patient had a biopsy that also showed invasive ductal carcinoma grade 3 ER positive PR negative HER-2/neu negative with a proliferation marker Ki-67 80%.Her clinical and radiologic staging was T2 N1 on the left side T2 Nx on the right side.   CURRENT THERAPY:  Neoadjuvant Taxol given weekly  INTERVAL HISTORY:  [name] is here today for evaluation prior to her fourth cycle of neoadjuvant weekly Taxol.  Tolerating well. Denies neuropathy. States her thrush has resolved. She denies fevers, chills, nausea, vomiting, constipation, diarrhea, numbness/tingling, skin changes or any further concerns.    REVIEW OF SYSTEMS: A 10 point review of systems was conducted and is otherwise negative except for what is noted above.     PAST MEDICAL HISTORY: Past Medical History  Diagnosis Date  . Chronic bronchitis   . Palpitation     Tachycardia reported by monitor clerk during a symptomatic spell  . Chest pain     Admitted to [facility] in 09/2011; refused stress test  . Atrial septal defect 1996    Surgical repair in 1996  . Tobacco abuse     60 pack years; 1.5 packs per day  . Anxiety   . Anemia   . Breast cancer     PAST SURGICAL HISTORY: Past Surgical History  Procedure Laterality Date  . Cholecystectomy    . Cesarean section    . Vein bypass surgery    . Breast biopsy Bilateral     FAMILY HISTORY Family History  Problem Relation Age of Onset  . Breast cancer Maternal Aunt     GYNECOLOGIC HISTORY: menarche at age 64, G42, p56, no h/o hormone replacement therapy, no h/o abnormal pap smears, or sexually transmitted infections  SOCIAL HISTORY: widowed, lives with mother and son, retired, enjoys functions at [employer], No ETOH, tobacco, or illicit drug use    ADVANCED DIRECTIVES: not in place   HEALTH MAINTENANCE: History  Substance Use Topics  . Smoking status: Former Smoker -- 1.50 packs/day for 40 years    Types: Cigarettes  . Smokeless tobacco: Never Used  . Alcohol Use: No    Mammogram: 06/2013 Colonoscopy: unknown, unsure Bone Density Scan: never Pap Smear: 2013 Eye Exam: never Vitamin D Level:  never Lipid Panel: never   Allergies  Allergen Reactions  . Codeine Other (See Comments)    Hot in chest    Current Outpatient Prescriptions  Medication Sig Dispense Refill  . B Complex-C (SUPER B COMPLEX PO) Take 1 tablet by mouth daily.      . clobetasol cream (TEMOVATE) 5.78 % Apply 1 application topically 2 (two) times daily. Apply to hands and feet twice a day as needed.  30 g  1  . lidocaine-prilocaine (EMLA) cream Apply 1 application topically as needed.      Marland Kitchen LORazepam (ATIVAN) 1 MG tablet Take 1 tablet (1 mg total) by mouth every 6 (six) hours as needed for anxiety.  30 tablet  1   . dexamethasone (DECADRON) 4 MG tablet Take 2 tables with a meal two times a day starting the day after chemo for two days.  30 tablet  2  . fluconazole (DIFLUCAN) 100 MG tablet Take 1 tablet (100 mg total) by mouth daily.  7 tablet  0  . potassium chloride SA (K-DUR,KLOR-CON) 20 MEQ tablet Take 1 tablet (20 mEq total) by mouth 2 (two) times daily. For 4 days  8 tablet  1  . prochlorperazine (COMPAZINE) 10 MG tablet TAKE 1 TABLET BY MOUTH EVERY 6 HOURS AS NEEDED FOR NAUSEA AND VOMITING  30 tablet  1   No current facility-administered medications for this visit.   Facility-Administered Medications Ordered in Other Visits  Medication Dose Route Frequency Provider Last Rate Last Dose  . heparin lock flush 100 unit/mL  500 Units Intracatheter Once PRN Chauncey Cruel, MD      . PACLitaxel (TAXOL) 126 mg in dextrose 5 % 250 mL chemo infusion (</= 60m/m2)  80 mg/m2 (Treatment Plan Actual) Intravenous Once GChauncey Cruel MD 271 mL/hr at 01/06/14 1119 126 mg at 01/06/14 1119  . sodium chloride 0.9 % injection 10 mL  10 mL Intracatheter PRN GChauncey Cruel MD        OBJECTIVE: Filed Vitals:   01/06/14 0923  BP: 117/77  Pulse: 79  Temp: 97 F (36.1 C)  Resp: 20     Body mass index is 24.12 kg/(m^2).     GENERAL: Patient is a well appearing female in no acute distress HEENT:  Sclerae anicteric.oropharyngeal candidiasis on tongue. Neck is supple.  NODES:  No cervical, supraclavicular, or axillary lymphadenopathy palpated.  BREAST EXAM:  Left breast mass  LUNGS:  Clear to auscultation bilaterally.  No wheezes or rhonchi. HEART:  Regular rate and rhythm. No murmur appreciated. ABDOMEN:  Soft, nontender.  Positive, normoactive bowel sounds. No organomegaly palpated. MSK:  No focal spinal tenderness to palpation. Full range of motion bilaterally in the upper extremities. EXTREMITIES:  No peripheral edema.   SKIN:  Clear with no obvious rashes or skin changes. No nail dyscrasia. NEURO:   Nonfocal. Well oriented.  Appropriate affect. ECOG FS:1 - Symptomatic but completely ambulatory  LAB RESULTS:  CMP     Component Value Date/Time   NA 141 01/06/2014 0849   NA 141 09/23/2012 0414   K 3.7 01/06/2014 0849   K 3.8 09/23/2012 0414   CL 107 09/23/2012 0414   CO2 26 01/06/2014 0849   CO2 29 10/30/2011 1419   GLUCOSE 137 01/06/2014 0849   GLUCOSE 192* 09/23/2012 0414   BUN 7.6 01/06/2014 0849   BUN 7 09/23/2012 0414   CREATININE 0.7 01/06/2014 0849   CREATININE 0.90 09/23/2012 0414   CALCIUM 9.2 01/06/2014 0849   CALCIUM 10.1 10/30/2011 1419   PROT 5.8* 01/06/2014 0849   ALBUMIN 3.3* 01/06/2014 0849   AST 20 01/06/2014 0849   ALT 23 01/06/2014 0849  ALKPHOS 46 01/06/2014 0849   BILITOT 0.56 01/06/2014 0849   GFRNONAA >90 10/30/2011 1419   GFRAA >90 10/30/2011 1419    I No results found for this basename: SPEP,  UPEP,   kappa and lambda light chains    Lab Results  Component Value Date   WBC 5.4 01/06/2014   NEUTROABS 3.9 01/06/2014   HGB 9.4* 01/06/2014   HCT 30.6* 01/06/2014   MCV 76.4* 01/06/2014   PLT 180 01/06/2014      Chemistry      Component Value Date/Time   NA 141 01/06/2014 0849   NA 141 09/23/2012 0414   K 3.7 01/06/2014 0849   K 3.8 09/23/2012 0414   CL 107 09/23/2012 0414   CO2 26 01/06/2014 0849   CO2 29 10/30/2011 1419   BUN 7.6 01/06/2014 0849   BUN 7 09/23/2012 0414   CREATININE 0.7 01/06/2014 0849   CREATININE 0.90 09/23/2012 0414      Component Value Date/Time   CALCIUM 9.2 01/06/2014 0849   CALCIUM 10.1 10/30/2011 1419   ALKPHOS 46 01/06/2014 0849   AST 20 01/06/2014 0849   ALT 23 01/06/2014 0849   BILITOT 0.56 01/06/2014 0849       No results found for this basename: LABCA2    No components found with this basename: LABCA125    No results found for this basename: INR,  in the last 168 hours  Urinalysis No results found for this basename: colorurine,  appearanceur,  labspec,  phurine,  glucoseu,  hgbur,  bilirubinur,  ketonesur,  proteinur,  urobilinogen,  nitrite,  leukocytesur      ASSESSMENT: 63 y.o. with pathologic right breast T2 N1 stage II, invasive ductal carcinoma, grade III, ER positive, PR negative, Ki-67 20%, HER-2/neu negative, right breast.  1.  Patient did undergo a PET CT on 08/22/2013 that demonstrated hypermetabolic breast masses, hypermetabolic lymphadenopathy, focal metabolic activity in the posterior right iliac bone, and hypermetabolism within the focus of the lower uterine segment. We will repeat an MRI of the pelvis following chmeotherapy  2. Patient was began on neoadjuvant treatment with chemotherapy consisting of Fluorouracil, Epirubicin, and Cyclophosphamide given on day 1 of a 14 day cycle with neulasta given on day 2 for granulocyte support from 09/15/13 through 11/25/13.  She received 6 cycles.    3.  The patient is on weekly Paclitaxel neoadjuvant chemotherapy started on 12/16/13.    4. Oral candidiasis - resolved.   PLAN:   Ms. Parks is doing well today.  Recommend that she proceed with cycle 4 of weekly Taxol today.   She will see Dr. Marlou Starks in July.    The patient will return in one week for labs, evaluation, and week 5 of Paclitaxel chemotherapy.  We will continue to monitor her closely for any signs of neuropathy.   She knows to call us in the interim for any questions or concerns.  We can certainly see her sooner if needed.  I spent 15 minutes counseling the patient face to face.  The total time spent in the appointment was 20 minutes.  Mikey Bussing, DNP, AGPCNP-BC  01/06/2014 11:59 AM

## 2014-01-13 ENCOUNTER — Ambulatory Visit (HOSPITAL_BASED_OUTPATIENT_CLINIC_OR_DEPARTMENT_OTHER): Payer: Medicare Other | Admitting: Family

## 2014-01-13 ENCOUNTER — Other Ambulatory Visit (HOSPITAL_BASED_OUTPATIENT_CLINIC_OR_DEPARTMENT_OTHER): Payer: Medicare Other

## 2014-01-13 ENCOUNTER — Ambulatory Visit (HOSPITAL_BASED_OUTPATIENT_CLINIC_OR_DEPARTMENT_OTHER): Payer: Medicare Other

## 2014-01-13 ENCOUNTER — Ambulatory Visit: Payer: Medicare Other | Admitting: Oncology

## 2014-01-13 VITALS — BP 120/72 | HR 83 | Temp 98.5°F | Resp 18 | Ht 60.0 in | Wt 124.6 lb

## 2014-01-13 DIAGNOSIS — C773 Secondary and unspecified malignant neoplasm of axilla and upper limb lymph nodes: Secondary | ICD-10-CM

## 2014-01-13 DIAGNOSIS — B370 Candidal stomatitis: Secondary | ICD-10-CM

## 2014-01-13 DIAGNOSIS — C50911 Malignant neoplasm of unspecified site of right female breast: Secondary | ICD-10-CM

## 2014-01-13 DIAGNOSIS — C50919 Malignant neoplasm of unspecified site of unspecified female breast: Secondary | ICD-10-CM

## 2014-01-13 DIAGNOSIS — C50912 Malignant neoplasm of unspecified site of left female breast: Principal | ICD-10-CM

## 2014-01-13 DIAGNOSIS — F064 Anxiety disorder due to known physiological condition: Secondary | ICD-10-CM

## 2014-01-13 DIAGNOSIS — Z5111 Encounter for antineoplastic chemotherapy: Secondary | ICD-10-CM

## 2014-01-13 LAB — COMPREHENSIVE METABOLIC PANEL (CC13)
ALBUMIN: 3.3 g/dL — AB (ref 3.5–5.0)
ALT: 23 U/L (ref 0–55)
AST: 22 U/L (ref 5–34)
Alkaline Phosphatase: 46 U/L (ref 40–150)
Anion Gap: 9 mEq/L (ref 3–11)
BILIRUBIN TOTAL: 0.47 mg/dL (ref 0.20–1.20)
BUN: 11.3 mg/dL (ref 7.0–26.0)
CALCIUM: 9.1 mg/dL (ref 8.4–10.4)
CO2: 27 meq/L (ref 22–29)
Chloride: 107 mEq/L (ref 98–109)
Creatinine: 0.7 mg/dL (ref 0.6–1.1)
GLUCOSE: 172 mg/dL — AB (ref 70–140)
POTASSIUM: 3.9 meq/L (ref 3.5–5.1)
Sodium: 142 mEq/L (ref 136–145)
TOTAL PROTEIN: 5.8 g/dL — AB (ref 6.4–8.3)

## 2014-01-13 LAB — CBC WITH DIFFERENTIAL/PLATELET
BASO%: 0.2 % (ref 0.0–2.0)
Basophils Absolute: 0 10*3/uL (ref 0.0–0.1)
EOS ABS: 0.1 10*3/uL (ref 0.0–0.5)
EOS%: 1.9 % (ref 0.0–7.0)
HCT: 29 % — ABNORMAL LOW (ref 34.8–46.6)
HGB: 9.1 g/dL — ABNORMAL LOW (ref 11.6–15.9)
LYMPH%: 13.3 % — ABNORMAL LOW (ref 14.0–49.7)
MCH: 23.6 pg — ABNORMAL LOW (ref 25.1–34.0)
MCHC: 31.4 g/dL — ABNORMAL LOW (ref 31.5–36.0)
MCV: 75.3 fL — AB (ref 79.5–101.0)
MONO#: 0.4 10*3/uL (ref 0.1–0.9)
MONO%: 6.3 % (ref 0.0–14.0)
NEUT%: 78.3 % — ABNORMAL HIGH (ref 38.4–76.8)
NEUTROS ABS: 4.6 10*3/uL (ref 1.5–6.5)
NRBC: 0 % (ref 0–0)
Platelets: 152 10*3/uL (ref 145–400)
RBC: 3.85 10*6/uL (ref 3.70–5.45)
RDW: 16.6 % — AB (ref 11.2–14.5)
WBC: 5.9 10*3/uL (ref 3.9–10.3)
lymph#: 0.8 10*3/uL — ABNORMAL LOW (ref 0.9–3.3)

## 2014-01-13 MED ORDER — SODIUM CHLORIDE 0.9 % IV SOLN
Freq: Once | INTRAVENOUS | Status: AC
  Administered 2014-01-13: 10:00:00 via INTRAVENOUS

## 2014-01-13 MED ORDER — NYSTATIN 100000 UNIT/ML MT SUSP: 5.0000 mL | Freq: Four times a day (QID) | OROMUCOSAL | Status: DC

## 2014-01-13 MED ORDER — FAMOTIDINE IN NACL 20-0.9 MG/50ML-% IV SOLN
20.0000 mg | Freq: Once | INTRAVENOUS | Status: AC
  Administered 2014-01-13: 20 mg via INTRAVENOUS

## 2014-01-13 MED ORDER — ONDANSETRON 8 MG/50ML IVPB (CHCC)
8.0000 mg | Freq: Once | INTRAVENOUS | Status: AC
  Administered 2014-01-13: 8 mg via INTRAVENOUS

## 2014-01-13 MED ORDER — ONDANSETRON 8 MG/NS 50 ML IVPB
INTRAVENOUS | Status: AC
Start: 2014-01-13 — End: 2014-01-13
  Filled 2014-01-13: qty 8

## 2014-01-13 MED ORDER — HEPARIN SOD (PORK) LOCK FLUSH 100 UNIT/ML IV SOLN
500.0000 [IU] | Freq: Once | INTRAVENOUS | Status: AC | PRN
  Administered 2014-01-13: 500 [IU]
  Filled 2014-01-13: qty 5

## 2014-01-13 MED ORDER — SODIUM CHLORIDE 0.9 % IJ SOLN
10.0000 mL | INTRAMUSCULAR | Status: DC | PRN
  Administered 2014-01-13: 10 mL
  Filled 2014-01-13: qty 10

## 2014-01-13 MED ORDER — DIPHENHYDRAMINE HCL 50 MG/ML IJ SOLN
INTRAMUSCULAR | Status: AC
  Filled 2014-01-13: qty 1

## 2014-01-13 MED ORDER — DIPHENHYDRAMINE HCL 50 MG/ML IJ SOLN
25.0000 mg | Freq: Once | INTRAMUSCULAR | Status: AC
  Administered 2014-01-13: 25 mg via INTRAVENOUS

## 2014-01-13 MED ORDER — PACLITAXEL CHEMO INJECTION 300 MG/50ML
80.0000 mg/m2 | Freq: Once | INTRAVENOUS | Status: AC
  Administered 2014-01-13: 126 mg via INTRAVENOUS
  Filled 2014-01-13: qty 21

## 2014-01-13 MED ORDER — FAMOTIDINE IN NACL 20-0.9 MG/50ML-% IV SOLN
INTRAVENOUS | Status: AC
  Filled 2014-01-13: qty 50

## 2014-01-13 MED ORDER — DEXAMETHASONE SODIUM PHOSPHATE 20 MG/5ML IJ SOLN
INTRAMUSCULAR | Status: AC
  Filled 2014-01-13: qty 5

## 2014-01-13 MED ORDER — DEXAMETHASONE SODIUM PHOSPHATE 20 MG/5ML IJ SOLN
20.0000 mg | Freq: Once | INTRAMUSCULAR | Status: AC
  Administered 2014-01-13: 20 mg via INTRAVENOUS

## 2014-01-13 NOTE — Progress Notes (Signed)
Discharged alone in no distress at 11:58 am.

## 2014-01-13 NOTE — Patient Instructions (Signed)
[facility] Discharge Instructions for Patients Receiving Chemotherapy  Today you received the following chemotherapy agents Taxol.  To help prevent nausea and vomiting after your treatment, we encourage you to take your nausea medication Compazine 10 mg as ordered.   If you develop nausea and vomiting that is not controlled by your nausea medication, call the clinic.   BELOW ARE SYMPTOMS THAT SHOULD BE REPORTED IMMEDIATELY:  *FEVER GREATER THAN 100.5 F  *CHILLS WITH OR WITHOUT FEVER  NAUSEA AND VOMITING THAT IS NOT CONTROLLED WITH YOUR NAUSEA MEDICATION  *UNUSUAL SHORTNESS OF BREATH  *UNUSUAL BRUISING OR BLEEDING  TENDERNESS IN MOUTH AND THROAT WITH OR WITHOUT PRESENCE OF ULCERS  *URINARY PROBLEMS  *BOWEL PROBLEMS  UNUSUAL RASH Items with * indicate a potential emergency and should be followed up as soon as possible.  Feel free to call the clinic you have any questions or concerns. The clinic phone number is [phone].

## 2014-01-13 NOTE — Progress Notes (Signed)
ID: Paula Navarro OB: 1950/12/07 MR#: [id number] CSN#:[id number]  PCP: Lanette Hampshire, MD  GYN:  SU: Dr Marlou Starks  OTHER MD:  CHIEF COMPLAINT: 63 y/o female with bilateral breast cancer undergoing neoadjuvant chemotherapy  BREAST CANCER HISTORY:  Patient developed pain in her breasts and palpated masses. Because of this she went on to have further workup performed including mammograms and ultrasound. On 07/23/2013 patient had bilateral diagnostic mammogram she was noted to have bilateral breast masses on the right irregular mass lying posterior lateral breast associated with a large rounded and dense axillary nodes. The left showed slightly smaller irregular mass lying at the 11:00 to 12:00 position. There was a smaller adjacent cyst. Also noted to have a large dense axillary lymph nodes on the left. Because of this she was recommended ultrasound and biopsies. 08/07/2013 patient underwent ultrasound-guided right breast core needle biopsy as well as left biopsy. 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 The lymph node was positive for metastatic disease. On the left patient had a biopsy that also showed invasive ductal carcinoma grade 3 ER positive PR negative HER-2/neu negative with a proliferation marker Ki-67 80%. Her clinical and radiologic staging was T2 N1 on the left side T2 Nx on the right side.   CURRENT THERAPY: Neoadjuvant Taxol given weekly  INTERVAL HISTORY: [name] is here today for evaluation prior to her fifth cycle of neoadjuvant weekly Taxol. Tolerating well. 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 States her thrush has almost completely resolved. She denies fevers, chills, nausea, vomiting, constipation, diarrhea, numbness/tingling, skin changes or any further concerns.  REVIEW OF SYSTEMS: A 10 point review of systems was  conducted and is otherwise negative except for what is noted above.  PAST MEDICAL HISTORY:  Past Medical History   Diagnosis  Date   .  Chronic bronchitis    .  Palpitation      Tachycardia reported by monitor clerk during a symptomatic spell   .  Chest pain      Admitted to [facility] in 09/2011; refused stress test   .  Atrial septal defect  1996     Surgical repair in 1996   .  Tobacco abuse      60 pack years; 1.5 packs per day   .  Anxiety    .  Anemia    .  Breast cancer    PAST SURGICAL HISTORY:  Past Surgical History   Procedure  Laterality  Date   .  Cholecystectomy     .  Cesarean section     .  Vein bypass surgery     .  Breast biopsy  Bilateral    FAMILY HISTORY  Family History   Problem  Relation  Age of Onset   .  Breast cancer  Maternal Aunt    GYNECOLOGIC HISTORY: menarche at age 29, G13, p94, no h/o hormone replacement therapy, no h/o abnormal pap smears, or sexually transmitted infections  SOCIAL HISTORY: widowed, lives with mother and son, retired, enjoys functions at [employer], No ETOH, tobacco, or illicit drug use  ADVANCED DIRECTIVES: not in place  HEALTH MAINTENANCE:  History   Substance Use Topics   .  Smoking status:  Former Smoker -- 1.50 packs/day for 40 years     Types:  Cigarettes   .  Smokeless tobacco:  Never Used   .  Alcohol Use:  No   Mammogram: 06/2013  Colonoscopy: unknown, unsure  Bone Density Scan: never  Pap Smear: 2013  Eye Exam: never  Vitamin D Level: never  Lipid Panel: never  Allergies   Allergen  Reactions   .  Codeine  Other (See Comments)     Hot in chest    Current Outpatient Prescriptions   Medication  Sig  Dispense  Refill   .  B Complex-C (SUPER B COMPLEX PO)  Take 1 tablet by mouth daily.     .  clobetasol cream (TEMOVATE) 2.72 %  Apply 1 application topically 2 (two) times daily. Apply to hands and feet twice a day as needed.  30 g  1   .  lidocaine-prilocaine (EMLA) cream  Apply 1 application topically as needed.     Marland Kitchen   LORazepam (ATIVAN) 1 MG tablet  Take 1 tablet (1 mg total) by mouth every 6 (six) hours as needed for anxiety.  30 tablet  1   .  dexamethasone (DECADRON) 4 MG tablet  Take 2 tables with a meal two times a day starting the day after chemo for two days.  30 tablet  2   .  fluconazole (DIFLUCAN) 100 MG tablet  Take 1 tablet (100 mg total) by mouth daily.  7 tablet  0   .  potassium chloride SA (K-DUR,KLOR-CON) 20 MEQ tablet  Take 1 tablet (20 mEq total) by mouth 2 (two) times daily. For 4 days  8 tablet  1   .  prochlorperazine (COMPAZINE) 10 MG tablet  TAKE 1 TABLET BY MOUTH EVERY 6 HOURS AS NEEDED FOR NAUSEA AND VOMITING  30 tablet  1    No current facility-administered medications for this visit.    Facility-Administered Medications Ordered in Other Visits   Medication  Dose  Route  Frequency  Provider  Last Rate  Last Dose   .  heparin lock flush 100 unit/mL  500 Units  Intracatheter  Once PRN  Chauncey Cruel, MD     .  PACLitaxel (TAXOL) 126 mg in dextrose 5 % 250 mL chemo infusion (</= 72m/m2)  80 mg/m2 (Treatment Plan Actual)  Intravenous  Once  GChauncey Cruel MD  271 mL/hr at 01/06/14 1119  126 mg at 01/06/14 1119   .  sodium chloride 0.9 % injection 10 mL  10 mL  Intracatheter  PRN  GChauncey Cruel MD     OBJECTIVE:  Filed Vitals:    01/13/2014   BP:  115/66  Pulse:  68  Temp:  98.3  Resp:  18  Body mass index is 24.12 kg/(m^2).  GENERAL: Patient is a well appearing female in no acute distress  HEENT: Sclerae anicteric.oropharyngeal candidiasis on tongue. Neck is supple.  NODES: No cervical, supraclavicular, or axillary lymphadenopathy palpated.  BREAST EXAM: Left breast mass  LUNGS: Clear to auscultation bilaterally. No wheezes or rhonchi.  HEART: Regular rate and rhythm. No murmur appreciated.  ABDOMEN: Soft, nontender. Positive, normoactive bowel sounds. No organomegaly palpated.  MSK: No focal spinal tenderness to palpation. Full range of motion bilaterally in the  upper extremities.  EXTREMITIES: No peripheral edema.  SKIN: Clear with no obvious rashes or skin changes. No nail dyscrasia.  NEURO: Nonfocal. Well oriented. Appropriate affect.  ECOG FS:1 - Symptomatic but completely ambulatory  LAB RESULTS:  CBC    Component Value Date/Time   WBC 5.9 01/13/2014 0814   WBC 14.6* 09/08/2013 1300   RBC 3.85 01/13/2014  0814   RBC 5.83* 09/08/2013 1300   HGB 9.1* 01/13/2014 0814   HGB 13.6 09/08/2013 1300   HCT 29.0* 01/13/2014 0814   HCT 41.0 09/08/2013 1300   PLT 152 01/13/2014 0814   PLT 306 09/08/2013 1300   MCV 75.3* 01/13/2014 0814   MCV 70.3* 09/08/2013 1300   MCH 23.6* 01/13/2014 0814   MCH 23.3* 09/08/2013 1300   MCHC 31.4* 01/13/2014 0814   MCHC 33.2 09/08/2013 1300   RDW 16.6* 01/13/2014 0814   RDW 14.7 09/08/2013 1300   LYMPHSABS 0.8* 01/13/2014 0814   LYMPHSABS 4.3* 10/30/2011 1419   MONOABS 0.4 01/13/2014 0814   MONOABS 0.5 10/30/2011 1419   EOSABS 0.1 01/13/2014 0814   EOSABS 0.3 10/30/2011 1419   BASOSABS 0.0 01/13/2014 0814   BASOSABS 0.1 10/30/2011 1419   CMP     Component Value Date/Time   NA 142 01/13/2014 0814   NA 141 09/23/2012 0414   K 3.9 01/13/2014 0814   K 3.8 09/23/2012 0414   CL 107 09/23/2012 0414   CO2 27 01/13/2014 0814   CO2 29 10/30/2011 1419   GLUCOSE 172* 01/13/2014 0814   GLUCOSE 192* 09/23/2012 0414   BUN 11.3 01/13/2014 0814   BUN 7 09/23/2012 0414   CREATININE 0.7 01/13/2014 0814   CREATININE 0.90 09/23/2012 0414   CALCIUM 9.1 01/13/2014 0814   CALCIUM 10.1 10/30/2011 1419   PROT 5.8* 01/13/2014 0814   ALBUMIN 3.3* 01/13/2014 0814   AST 22 01/13/2014 0814   ALT 23 01/13/2014 0814   ALKPHOS 46 01/13/2014 0814   BILITOT 0.47 01/13/2014 0814   GFRNONAA >90 10/30/2011 1419   GFRAA >90 10/30/2011 1419   No results found for this basename: SPEP, UPEP, kappa and lambda light chains    No results found for this basename: LABCA2    No components found with this basename: LABCA125   No results found for this basename: INR, in the last 168 hours   Urinalysis  No results found for this basename: colorurine, appearanceur, labspec, phurine, glucoseu, hgbur, bilirubinur, ketonesur, proteinur, urobilinogen, nitrite, leukocytesur   ASSESSMENT: 63 y.o. with pathologic right breast T2 N1 stage II, invasive ductal carcinoma, grade III, ER positive, PR negative, Ki-67 20%, HER-2/neu negative, right breast.  1. Patient did undergo a PET CT on 08/22/2013 that demonstrated hypermetabolic breast masses, hypermetabolic lymphadenopathy, focal metabolic activity in the posterior right iliac bone, and hypermetabolism within the focus of the lower uterine segment. We will repeat an MRI of the pelvis following chmeotherapy  2. Patient was began on neoadjuvant treatment with chemotherapy consisting of Fluorouracil, Epirubicin, and Cyclophosphamide given on day 1 of a 14 day cycle with neulasta given on day 2 for granulocyte support from 09/15/13 through 11/25/13. She received 6 cycles.  3. The patient is on weekly Paclitaxel neoadjuvant chemotherapy started on 12/16/13.  4. Oral candidiasis - resolving. Gave prescription for Nystatin swish and swallow and instructed on how to take.   PLAN:  Ms. Mosquera is doing well today. Recommend that she proceed with cycle 5 of weekly Taxol today.  She will see Dr. Marlou Starks in July.  The patient will return in one week for labs, evaluation, and week 6 of Paclitaxel chemotherapy. We will continue to monitor her closely for any changes in neuropathy. She knows to call us in the interim for any questions or concerns. We can certainly see her sooner if needed.  I spent 15 minutes counseling the patient face to face. The total time spent  in the appointment was 20 minutes. Laverna Peace, FNP-BC Oncology 01/13/2014

## 2014-01-13 NOTE — Patient Instructions (Addendum)
Recommend that she proceed with cycle 5 of weekly Taxol today. Nystatin swish and swallow: take 5 ml by mouth 4 times daily for 1 week.  Will see Dr. Marlou Starks in July.  The patient will return in one week for labs, evaluation, and week 6 of Paclitaxel chemotherapy. We will continue to monitor closely for any changes in neuropathy.  Call us in the interim for any questions or concerns. We can certainly see sooner if needed.

## 2014-01-14 NOTE — Progress Notes (Signed)
Pt moved to another provider's schedule.

## 2014-01-20 ENCOUNTER — Ambulatory Visit (HOSPITAL_BASED_OUTPATIENT_CLINIC_OR_DEPARTMENT_OTHER): Payer: Medicare Other

## 2014-01-20 ENCOUNTER — Other Ambulatory Visit: Payer: Medicare Other

## 2014-01-20 ENCOUNTER — Ambulatory Visit (HOSPITAL_BASED_OUTPATIENT_CLINIC_OR_DEPARTMENT_OTHER): Payer: Medicare Other | Admitting: Family

## 2014-01-20 ENCOUNTER — Other Ambulatory Visit (HOSPITAL_BASED_OUTPATIENT_CLINIC_OR_DEPARTMENT_OTHER): Payer: Medicare Other

## 2014-01-20 ENCOUNTER — Ambulatory Visit: Payer: Medicare Other | Admitting: Oncology

## 2014-01-20 VITALS — BP 126/61 | HR 76 | Temp 98.8°F | Resp 18 | Ht 60.0 in | Wt 124.3 lb

## 2014-01-20 DIAGNOSIS — C50919 Malignant neoplasm of unspecified site of unspecified female breast: Secondary | ICD-10-CM

## 2014-01-20 DIAGNOSIS — G5790 Unspecified mononeuropathy of unspecified lower limb: Secondary | ICD-10-CM

## 2014-01-20 DIAGNOSIS — C50911 Malignant neoplasm of unspecified site of right female breast: Secondary | ICD-10-CM

## 2014-01-20 DIAGNOSIS — C773 Secondary and unspecified malignant neoplasm of axilla and upper limb lymph nodes: Secondary | ICD-10-CM

## 2014-01-20 DIAGNOSIS — C50912 Malignant neoplasm of unspecified site of left female breast: Principal | ICD-10-CM

## 2014-01-20 DIAGNOSIS — Z170 Estrogen receptor positive status [ER+]: Secondary | ICD-10-CM

## 2014-01-20 DIAGNOSIS — Z5111 Encounter for antineoplastic chemotherapy: Secondary | ICD-10-CM

## 2014-01-20 LAB — CBC WITH DIFFERENTIAL/PLATELET
BASO%: 0 % (ref 0.0–2.0)
Basophils Absolute: 0 10*3/uL (ref 0.0–0.1)
EOS%: 1 % (ref 0.0–7.0)
Eosinophils Absolute: 0.1 10*3/uL (ref 0.0–0.5)
HCT: 31.2 % — ABNORMAL LOW (ref 34.8–46.6)
HGB: 9.6 g/dL — ABNORMAL LOW (ref 11.6–15.9)
LYMPH%: 14 % (ref 14.0–49.7)
MCH: 23.7 pg — AB (ref 25.1–34.0)
MCHC: 30.8 g/dL — ABNORMAL LOW (ref 31.5–36.0)
MCV: 77 fL — AB (ref 79.5–101.0)
MONO#: 0.4 10*3/uL (ref 0.1–0.9)
MONO%: 4.9 % (ref 0.0–14.0)
NEUT#: 5.9 10*3/uL (ref 1.5–6.5)
NEUT%: 80.1 % — ABNORMAL HIGH (ref 38.4–76.8)
PLATELETS: 242 10*3/uL (ref 145–400)
RBC: 4.05 10*6/uL (ref 3.70–5.45)
RDW: 16.5 % — AB (ref 11.2–14.5)
WBC: 7.3 10*3/uL (ref 3.9–10.3)
lymph#: 1 10*3/uL (ref 0.9–3.3)

## 2014-01-20 LAB — COMPREHENSIVE METABOLIC PANEL (CC13)
ALBUMIN: 3.4 g/dL — AB (ref 3.5–5.0)
ALK PHOS: 49 U/L (ref 40–150)
ALT: 23 U/L (ref 0–55)
AST: 23 U/L (ref 5–34)
Anion Gap: 8 mEq/L (ref 3–11)
BILIRUBIN TOTAL: 0.48 mg/dL (ref 0.20–1.20)
BUN: 7 mg/dL (ref 7.0–26.0)
CO2: 27 mEq/L (ref 22–29)
Calcium: 9.2 mg/dL (ref 8.4–10.4)
Chloride: 105 mEq/L (ref 98–109)
Creatinine: 0.7 mg/dL (ref 0.6–1.1)
GLUCOSE: 106 mg/dL (ref 70–140)
POTASSIUM: 4 meq/L (ref 3.5–5.1)
Sodium: 140 mEq/L (ref 136–145)
TOTAL PROTEIN: 6 g/dL — AB (ref 6.4–8.3)

## 2014-01-20 MED ORDER — HEPARIN SOD (PORK) LOCK FLUSH 100 UNIT/ML IV SOLN
500.0000 [IU] | Freq: Once | INTRAVENOUS | Status: AC | PRN
Start: 2014-01-20 — End: 2014-01-20
  Administered 2014-01-20: 500 [IU]
  Filled 2014-01-20: qty 5

## 2014-01-20 MED ORDER — DEXAMETHASONE SODIUM PHOSPHATE 20 MG/5ML IJ SOLN
INTRAMUSCULAR | Status: AC
  Filled 2014-01-20: qty 5

## 2014-01-20 MED ORDER — DIPHENHYDRAMINE HCL 50 MG/ML IJ SOLN
25.0000 mg | Freq: Once | INTRAMUSCULAR | Status: AC
  Administered 2014-01-20: 25 mg via INTRAVENOUS

## 2014-01-20 MED ORDER — PACLITAXEL CHEMO INJECTION 300 MG/50ML
80.0000 mg/m2 | Freq: Once | INTRAVENOUS | Status: AC
  Administered 2014-01-20: 126 mg via INTRAVENOUS
  Filled 2014-01-20: qty 21

## 2014-01-20 MED ORDER — FAMOTIDINE IN NACL 20-0.9 MG/50ML-% IV SOLN
20.0000 mg | Freq: Once | INTRAVENOUS | Status: AC
  Administered 2014-01-20: 20 mg via INTRAVENOUS

## 2014-01-20 MED ORDER — DEXAMETHASONE SODIUM PHOSPHATE 20 MG/5ML IJ SOLN
20.0000 mg | Freq: Once | INTRAMUSCULAR | Status: AC
  Administered 2014-01-20: 20 mg via INTRAVENOUS

## 2014-01-20 MED ORDER — SODIUM CHLORIDE 0.9 % IJ SOLN
10.0000 mL | INTRAMUSCULAR | Status: DC | PRN
  Administered 2014-01-20: 10 mL
  Filled 2014-01-20: qty 10

## 2014-01-20 MED ORDER — LIDOCAINE-PRILOCAINE 2.5-2.5 % EX CREA: 1.0000 "application " | TOPICAL_CREAM | CUTANEOUS | Status: DC | PRN

## 2014-01-20 MED ORDER — ONDANSETRON 8 MG/50ML IVPB (CHCC)
8.0000 mg | Freq: Once | INTRAVENOUS | Status: AC
  Administered 2014-01-20: 8 mg via INTRAVENOUS

## 2014-01-20 MED ORDER — SODIUM CHLORIDE 0.9 % IV SOLN
Freq: Once | INTRAVENOUS | Status: AC
  Administered 2014-01-20: 09:00:00 via INTRAVENOUS

## 2014-01-20 MED ORDER — FAMOTIDINE IN NACL 20-0.9 MG/50ML-% IV SOLN
INTRAVENOUS | Status: AC
  Filled 2014-01-20: qty 50

## 2014-01-20 MED ORDER — DIPHENHYDRAMINE HCL 50 MG/ML IJ SOLN
INTRAMUSCULAR | Status: AC
  Filled 2014-01-20: qty 1

## 2014-01-20 MED ORDER — ONDANSETRON 8 MG/NS 50 ML IVPB
INTRAVENOUS | Status: AC
  Filled 2014-01-20: qty 8

## 2014-01-20 NOTE — Progress Notes (Signed)
CMET results showed to Erasmo Downer, NP.  Proceed with chemo as per NP.

## 2014-01-20 NOTE — Progress Notes (Signed)
ID: Paula Navarro OB: August 09, 1950 MR#: [id number] CSN#:[id number]  PCP: Lanette Hampshire, MD  GYN:  SU: Dr Marlou Starks  OTHER MD:  CHIEF COMPLAINT: 63 y/o female with bilateral breast cancer undergoing neoadjuvant chemotherapy  BREAST CANCER HISTORY:  Patient developed pain in her breasts and palpated masses. Because of this she went on to have further workup performed including mammograms and ultrasound. On 07/23/2013 patient had bilateral diagnostic mammogram she was noted to have bilateral breast masses on the right irregular mass lying posterior lateral breast associated with a large rounded and dense axillary nodes. The left showed slightly smaller irregular mass lying at the 11:00 to 12:00 position. There was a smaller adjacent cyst. Also noted to have a large dense axillary lymph nodes on the left. Because of this she was recommended ultrasound and biopsies. 08/07/2013 patient underwent ultrasound-guided right breast core needle biopsy as well as left biopsy. 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 The lymph node was positive for metastatic disease. On the left patient had a biopsy that also showed invasive ductal carcinoma grade 3 ER positive PR negative HER-2/neu negative with a proliferation marker Ki-67 80%. Her clinical and radiologic staging was T2 N1 on the left side T2 Nx on the right side.   CURRENT THERAPY: Neoadjuvant Taxol given weekly  INTERVAL HISTORY: Paula Navarro is here today for evaluation prior to her sixth cycle of neoadjuvant weekly Taxol. Tolerating well. 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 States her thrush has completely resolved. She states that she has some mild numbness and tingling in the toes of both her feet but that it is tolerable. She denies fevers, chills, nausea, vomiting, constipation, diarrhea, skin  changes or any further concerns.  REVIEW OF SYSTEMS: A 10 point review of systems was conducted and is otherwise negative except for what is noted above.  PAST MEDICAL HISTORY:  Past Medical History   Diagnosis  Date   .  Chronic bronchitis    .  Palpitation      Tachycardia reported by monitor clerk during a symptomatic spell   .  Chest pain      Admitted to [facility] in 09/2011; refused stress test   .  Atrial septal defect  1996     Surgical repair in 1996   .  Tobacco abuse      60 pack years; 1.5 packs per day   .  Anxiety    .  Anemia    .  Breast cancer    PAST SURGICAL HISTORY:  Past Surgical History   Procedure  Laterality  Date   .  Cholecystectomy     .  Cesarean section     .  Vein bypass surgery     .  Breast biopsy  Bilateral    FAMILY HISTORY  Family History   Problem  Relation  Age of Onset   .  Breast cancer  Maternal Aunt    GYNECOLOGIC HISTORY: menarche at age 33, G71, p60, no h/o hormone replacement therapy, no h/o abnormal pap smears, or sexually transmitted infections  SOCIAL HISTORY: widowed, lives with mother and son, retired, enjoys functions at [employer], No ETOH, tobacco, or illicit drug use   ADVANCED DIRECTIVES: not in place  HEALTH MAINTENANCE:  History   Substance Use Topics   .  Smoking status:  Former Smoker --  1.50 packs/day for 40 years     Types:  Cigarettes   .  Smokeless tobacco:  Never Used   .  Alcohol Use:  No   Mammogram: 06/2013  Colonoscopy: unknown, unsure  Bone Density Scan: never  Pap Smear: 2013  Eye Exam: never  Vitamin D Level: never  Lipid Panel: never  Allergies   Allergen  Reactions   .  Codeine  Other (See Comments)     Hot in chest    Current Outpatient Prescriptions   Medication  Sig  Dispense  Refill   .  B Complex-C (SUPER B COMPLEX PO)  Take 1 tablet by mouth daily.     .  clobetasol cream (TEMOVATE) 2.59 %  Apply 1 application topically 2 (two) times daily. Apply to hands and feet twice a day as needed.  30 g  1   .   lidocaine-prilocaine (EMLA) cream  Apply 1 application topically as needed.     Marland Kitchen  LORazepam (ATIVAN) 1 MG tablet  Take 1 tablet (1 mg total) by mouth every 6 (six) hours as needed for anxiety.  30 tablet  1   .  dexamethasone (DECADRON) 4 MG tablet  Take 2 tables with a meal two times a day starting the day after chemo for two days.  30 tablet  2   .  fluconazole (DIFLUCAN) 100 MG tablet  Take 1 tablet (100 mg total) by mouth daily.  7 tablet  0   .  potassium chloride SA (K-DUR,KLOR-CON) 20 MEQ tablet  Take 1 tablet (20 mEq total) by mouth 2 (two) times daily. For 4 days  8 tablet  1   .  prochlorperazine (COMPAZINE) 10 MG tablet  TAKE 1 TABLET BY MOUTH EVERY 6 HOURS AS NEEDED FOR NAUSEA AND VOMITING  30 tablet  1    No current facility-administered medications for this visit.    Facility-Administered Medications Ordered in Other Visits   Medication  Dose  Route  Frequency  Provider  Last Rate  Last Dose   .  heparin lock flush 100 unit/mL  500 Units  Intracatheter  Once PRN  Chauncey Cruel, MD     .  PACLitaxel (TAXOL) 126 mg in dextrose 5 % 250 mL chemo infusion (</= $RemoveBefor'80mg'[id number]$ /m2)  80 mg/m2 (Treatment Plan Actual)  Intravenous  Once  Chauncey Cruel, MD  271 mL/hr at 01/06/14 1119  126 mg at 01/06/14 1119   .  sodium chloride 0.9 % injection 10 mL  10 mL  Intracatheter  PRN  Chauncey Cruel, MD     OBJECTIVE:  Filed Vitals:    01/20/2014   BP:  115/66  Pulse:  68  Temp:  98.3  Resp:  18  Body mass index is 24.12 kg/(m^2).  GENERAL: Patient is a well appearing female in no acute distress  HEENT: Sclerae anicteric.oropharyngeal candidiasis on tongue resolved. Neck is supple.  NODES: No cervical, supraclavicular, or axillary lymphadenopathy palpated.  BREAST EXAM: Deferred LUNGS: Clear to auscultation bilaterally. No wheezes or rhonchi.  HEART: Regular rate and rhythm. No murmur appreciated.  ABDOMEN: Soft, nontender. Positive, normoactive bowel sounds. No organomegaly palpated.   MSK: No focal spinal tenderness to palpation. Full range of motion bilaterally in the upper extremities.  EXTREMITIES: No peripheral edema.  SKIN: Clear with no obvious rashes or skin changes. No nail dyscrasia.  NEURO: Nonfocal. Well oriented. Appropriate affect.  ECOG FS:1 - Symptomatic but completely ambulatory  LAB RESULTS:  CBC  Component Value Date/Time   WBC 7.3 01/20/2014 0837   WBC 14.6* 09/08/2013 1300   RBC 4.05 01/20/2014 0837   RBC 5.83* 09/08/2013 1300   HGB 9.6* 01/20/2014 0837   HGB 13.6 09/08/2013 1300   HCT 31.2* 01/20/2014 0837   HCT 41.0 09/08/2013 1300   PLT 242 01/20/2014 0837   PLT 306 09/08/2013 1300   MCV 77.0* 01/20/2014 0837   MCV 70.3* 09/08/2013 1300   MCH 23.7* 01/20/2014 0837   MCH 23.3* 09/08/2013 1300   MCHC 30.8* 01/20/2014 0837   MCHC 33.2 09/08/2013 1300   RDW 16.5* 01/20/2014 0837   RDW 14.7 09/08/2013 1300   LYMPHSABS 1.0 01/20/2014 0837   LYMPHSABS 4.3* 10/30/2011 1419   MONOABS 0.4 01/20/2014 0837   MONOABS 0.5 10/30/2011 1419   EOSABS 0.1 01/20/2014 0837   EOSABS 0.3 10/30/2011 1419   BASOSABS 0.0 01/20/2014 0837   BASOSABS 0.1 10/30/2011 1419   CMP     Component Value Date/Time   NA 142 01/13/2014 0814   NA 141 09/23/2012 0414   K 3.9 01/13/2014 0814   K 3.8 09/23/2012 0414   CL 107 09/23/2012 0414   CO2 27 01/13/2014 0814   CO2 29 10/30/2011 1419   GLUCOSE 172* 01/13/2014 0814   GLUCOSE 192* 09/23/2012 0414   BUN 11.3 01/13/2014 0814   BUN 7 09/23/2012 0414   CREATININE 0.7 01/13/2014 0814   CREATININE 0.90 09/23/2012 0414   CALCIUM 9.1 01/13/2014 0814   CALCIUM 10.1 10/30/2011 1419   PROT 5.8* 01/13/2014 0814   ALBUMIN 3.3* 01/13/2014 0814   AST 22 01/13/2014 0814   ALT 23 01/13/2014 0814   ALKPHOS 46 01/13/2014 0814   BILITOT 0.47 01/13/2014 0814   GFRNONAA >90 10/30/2011 1419   GFRAA >90 10/30/2011 1419   No results found for this basename: SPEP, UPEP, kappa and lambda light chains    No results found for this basename: LABCA2    No components found with this  basename: LABCA125   No results found for this basename: INR, in the last 168 hours  Urinalysis  No results found for this basename: colorurine, appearanceur, labspec, phurine, glucoseu, hgbur, bilirubinur, ketonesur, proteinur, urobilinogen, nitrite, leukocytesur   ASSESSMENT: 63 y.o. with pathologic right breast T2 N1 stage II, invasive ductal carcinoma, grade III, ER positive, PR negative, Ki-67 20%, HER-2/neu negative, right breast.  1. Patient did undergo a PET CT on 08/22/2013 that demonstrated hypermetabolic breast masses, hypermetabolic lymphadenopathy, focal metabolic activity in the posterior right iliac bone, and hypermetabolism within the focus of the lower uterine segment. We will repeat an MRI of the pelvis following chmeotherapy  2. Patient was began on neoadjuvant treatment with chemotherapy consisting of Fluorouracil, Epirubicin, and Cyclophosphamide given on day 1 of a 14 day cycle with neulasta given on day 2 for granulocyte support from 09/15/13 through 11/25/13. She received 6 cycles.  3. The patient is on weekly Paclitaxel neoadjuvant chemotherapy started on 12/16/13.  4. Oral candidiasis - resolved. Still using nystatin swish and swallow as needed.   PLAN:  Paula Navarro is doing well today. Recommend that she proceed with cycle 6 of weekly Taxol today.  We discussed her labs in detail.  A refill for EMLA was sent to her pharmacy in [place]. She will see Dr. Marlou Starks on June 30th.  The patient will return in one week for labs, evaluation, and week 7 of Paclitaxel chemotherapy. We will continue to monitor her closely for any changes in neuropathy. She knows  to call us in the interim for any questions or concerns. We can certainly see her sooner if needed.  I spent 15 minutes counseling the patient face to face. The total time spent in the appointment was 20 minutes. Laverna Peace, FNP-BC Oncology 01/20/2014

## 2014-01-20 NOTE — Patient Instructions (Signed)
[facility] Discharge Instructions for Patients Receiving Chemotherapy  Today you received the following chemotherapy agents :  Taxol.  To help prevent nausea and vomiting after your treatment, we encourage you to take your nausea medication as instructed by your physician.   If you develop nausea and vomiting that is not controlled by your nausea medication, call the clinic.   BELOW ARE SYMPTOMS THAT SHOULD BE REPORTED IMMEDIATELY:  *FEVER GREATER THAN 100.5 F  *CHILLS WITH OR WITHOUT FEVER  NAUSEA AND VOMITING THAT IS NOT CONTROLLED WITH YOUR NAUSEA MEDICATION  *UNUSUAL SHORTNESS OF BREATH  *UNUSUAL BRUISING OR BLEEDING  TENDERNESS IN MOUTH AND THROAT WITH OR WITHOUT PRESENCE OF ULCERS  *URINARY PROBLEMS  *BOWEL PROBLEMS  UNUSUAL RASH Items with * indicate a potential emergency and should be followed up as soon as possible.  Feel free to call the clinic you have any questions or concerns. The clinic phone number is [phone].

## 2014-01-27 ENCOUNTER — Ambulatory Visit (INDEPENDENT_AMBULATORY_CARE_PROVIDER_SITE_OTHER): Payer: Medicare Other | Admitting: General Surgery

## 2014-01-27 ENCOUNTER — Ambulatory Visit (HOSPITAL_BASED_OUTPATIENT_CLINIC_OR_DEPARTMENT_OTHER): Payer: Medicare Other

## 2014-01-27 ENCOUNTER — Ambulatory Visit (HOSPITAL_BASED_OUTPATIENT_CLINIC_OR_DEPARTMENT_OTHER): Payer: Medicare Other | Admitting: Adult Health

## 2014-01-27 ENCOUNTER — Encounter: Payer: Self-pay | Admitting: Adult Health

## 2014-01-27 ENCOUNTER — Other Ambulatory Visit (HOSPITAL_BASED_OUTPATIENT_CLINIC_OR_DEPARTMENT_OTHER): Payer: Medicare Other

## 2014-01-27 ENCOUNTER — Telehealth: Payer: Self-pay | Admitting: Adult Health

## 2014-01-27 VITALS — BP 133/81 | HR 80 | Temp 98.6°F | Resp 18 | Ht 60.0 in | Wt 124.9 lb

## 2014-01-27 DIAGNOSIS — C50911 Malignant neoplasm of unspecified site of right female breast: Secondary | ICD-10-CM

## 2014-01-27 DIAGNOSIS — G609 Hereditary and idiopathic neuropathy, unspecified: Secondary | ICD-10-CM

## 2014-01-27 DIAGNOSIS — Z5111 Encounter for antineoplastic chemotherapy: Secondary | ICD-10-CM

## 2014-01-27 DIAGNOSIS — Z170 Estrogen receptor positive status [ER+]: Secondary | ICD-10-CM

## 2014-01-27 DIAGNOSIS — C50219 Malignant neoplasm of upper-inner quadrant of unspecified female breast: Secondary | ICD-10-CM

## 2014-01-27 DIAGNOSIS — C773 Secondary and unspecified malignant neoplasm of axilla and upper limb lymph nodes: Secondary | ICD-10-CM

## 2014-01-27 DIAGNOSIS — C50912 Malignant neoplasm of unspecified site of left female breast: Principal | ICD-10-CM

## 2014-01-27 DIAGNOSIS — C50919 Malignant neoplasm of unspecified site of unspecified female breast: Secondary | ICD-10-CM

## 2014-01-27 LAB — COMPREHENSIVE METABOLIC PANEL (CC13)
ALK PHOS: 47 U/L (ref 40–150)
ALT: 27 U/L (ref 0–55)
AST: 22 U/L (ref 5–34)
Albumin: 3.3 g/dL — ABNORMAL LOW (ref 3.5–5.0)
Anion Gap: 8 mEq/L (ref 3–11)
BILIRUBIN TOTAL: 0.42 mg/dL (ref 0.20–1.20)
BUN: 8.2 mg/dL (ref 7.0–26.0)
CO2: 27 mEq/L (ref 22–29)
CREATININE: 0.7 mg/dL (ref 0.6–1.1)
Calcium: 9.3 mg/dL (ref 8.4–10.4)
Chloride: 107 mEq/L (ref 98–109)
GLUCOSE: 135 mg/dL (ref 70–140)
Potassium: 4.1 mEq/L (ref 3.5–5.1)
SODIUM: 142 meq/L (ref 136–145)
Total Protein: 5.9 g/dL — ABNORMAL LOW (ref 6.4–8.3)

## 2014-01-27 LAB — CBC WITH DIFFERENTIAL/PLATELET
BASO%: 0.6 % (ref 0.0–2.0)
Basophils Absolute: 0 10*3/uL (ref 0.0–0.1)
EOS ABS: 0.1 10*3/uL (ref 0.0–0.5)
EOS%: 0.9 % (ref 0.0–7.0)
HCT: 29.9 % — ABNORMAL LOW (ref 34.8–46.6)
HGB: 9.3 g/dL — ABNORMAL LOW (ref 11.6–15.9)
LYMPH%: 15.5 % (ref 14.0–49.7)
MCH: 24.2 pg — ABNORMAL LOW (ref 25.1–34.0)
MCHC: 31.2 g/dL — ABNORMAL LOW (ref 31.5–36.0)
MCV: 77.5 fL — AB (ref 79.5–101.0)
MONO#: 0.4 10*3/uL (ref 0.1–0.9)
MONO%: 6.7 % (ref 0.0–14.0)
NEUT%: 76.3 % (ref 38.4–76.8)
NEUTROS ABS: 4.9 10*3/uL (ref 1.5–6.5)
PLATELETS: 282 10*3/uL (ref 145–400)
RBC: 3.86 10*6/uL (ref 3.70–5.45)
RDW: 16.5 % — ABNORMAL HIGH (ref 11.2–14.5)
WBC: 6.4 10*3/uL (ref 3.9–10.3)
lymph#: 1 10*3/uL (ref 0.9–3.3)

## 2014-01-27 MED ORDER — ONDANSETRON 8 MG/NS 50 ML IVPB
INTRAVENOUS | Status: AC
  Filled 2014-01-27: qty 8

## 2014-01-27 MED ORDER — PACLITAXEL CHEMO INJECTION 300 MG/50ML
80.0000 mg/m2 | Freq: Once | INTRAVENOUS | Status: AC
  Administered 2014-01-27: 126 mg via INTRAVENOUS
  Filled 2014-01-27: qty 21

## 2014-01-27 MED ORDER — SODIUM CHLORIDE 0.9 % IV SOLN
Freq: Once | INTRAVENOUS | Status: AC
  Administered 2014-01-27: 10:00:00 via INTRAVENOUS

## 2014-01-27 MED ORDER — FAMOTIDINE IN NACL 20-0.9 MG/50ML-% IV SOLN
INTRAVENOUS | Status: AC
  Filled 2014-01-27: qty 50

## 2014-01-27 MED ORDER — DEXAMETHASONE SODIUM PHOSPHATE 20 MG/5ML IJ SOLN
INTRAMUSCULAR | Status: AC
  Filled 2014-01-27: qty 5

## 2014-01-27 MED ORDER — DEXAMETHASONE SODIUM PHOSPHATE 20 MG/5ML IJ SOLN
20.0000 mg | Freq: Once | INTRAMUSCULAR | Status: AC
  Administered 2014-01-27: 20 mg via INTRAVENOUS

## 2014-01-27 MED ORDER — DIPHENHYDRAMINE HCL 50 MG/ML IJ SOLN
INTRAMUSCULAR | Status: AC
  Filled 2014-01-27: qty 1

## 2014-01-27 MED ORDER — SODIUM CHLORIDE 0.9 % IJ SOLN
10.0000 mL | INTRAMUSCULAR | Status: DC | PRN
  Administered 2014-01-27: 10 mL
  Filled 2014-01-27: qty 10

## 2014-01-27 MED ORDER — FAMOTIDINE IN NACL 20-0.9 MG/50ML-% IV SOLN
20.0000 mg | Freq: Once | INTRAVENOUS | Status: AC
  Administered 2014-01-27: 20 mg via INTRAVENOUS

## 2014-01-27 MED ORDER — ONDANSETRON 8 MG/50ML IVPB (CHCC)
8.0000 mg | Freq: Once | INTRAVENOUS | Status: AC
  Administered 2014-01-27: 8 mg via INTRAVENOUS

## 2014-01-27 MED ORDER — HEPARIN SOD (PORK) LOCK FLUSH 100 UNIT/ML IV SOLN
500.0000 [IU] | Freq: Once | INTRAVENOUS | Status: AC | PRN
  Administered 2014-01-27: 500 [IU]
  Filled 2014-01-27: qty 5

## 2014-01-27 MED ORDER — DIPHENHYDRAMINE HCL 50 MG/ML IJ SOLN
25.0000 mg | Freq: Once | INTRAMUSCULAR | Status: AC
  Administered 2014-01-27: 25 mg via INTRAVENOUS

## 2014-01-27 NOTE — Patient Instructions (Signed)
[facility] Discharge Instructions for Patients Receiving Chemotherapy  Today you received the following chemotherapy agents: taxol  To help prevent nausea and vomiting after your treatment, we encourage you to take your nausea medication.  Take it as often as prescribed.     If you develop nausea and vomiting that is not controlled by your nausea medication, call the clinic. If it is after clinic hours your family physician or the after hours number for the clinic or go to the Emergency Department.   BELOW ARE SYMPTOMS THAT SHOULD BE REPORTED IMMEDIATELY:  *FEVER GREATER THAN 100.5 F  *CHILLS WITH OR WITHOUT FEVER  NAUSEA AND VOMITING THAT IS NOT CONTROLLED WITH YOUR NAUSEA MEDICATION  *UNUSUAL SHORTNESS OF BREATH  *UNUSUAL BRUISING OR BLEEDING  TENDERNESS IN MOUTH AND THROAT WITH OR WITHOUT PRESENCE OF ULCERS  *URINARY PROBLEMS  *BOWEL PROBLEMS  UNUSUAL RASH Items with * indicate a potential emergency and should be followed up as soon as possible.  Feel free to call the clinic you have any questions or concerns. The clinic phone number is [phone].   I have been informed and understand all the instructions given to me. I know to contact the clinic, my physician, or go to the Emergency Department if any problems should occur. I do not have any questions at this time, but understand that I may call the clinic during office hours   should I have any questions or need assistance in obtaining follow up care.    __________________________________________  _____________  __________ Signature of Patient or Authorized Representative            Date                   Time    __________________________________________ Nurse's Signature    

## 2014-01-27 NOTE — Progress Notes (Signed)
ID: Paula Navarro OB: [birth year] MR#: [id number] CSN#:[id number]  PCP: Lanette Hampshire, MD  GYN:  SU: Dr Marlou Starks  OTHER MD:  CHIEF COMPLAINT: 63 y/o female with bilateral breast cancer undergoing neoadjuvant chemotherapy  BREAST CANCER HISTORY:  Patient developed pain in her breasts and palpated masses. Because of this she went on to have further workup performed including mammograms and ultrasound. On 07/23/2013 patient had bilateral diagnostic mammogram she was noted to have bilateral breast masses on the right irregular mass lying posterior lateral breast associated with a large rounded and dense axillary nodes. The left showed slightly smaller irregular mass lying at the 11:00 to 12:00 position. There was a smaller adjacent cyst. Also noted to have a large dense axillary lymph nodes on the left. Because of this she was recommended ultrasound and biopsies. 08/07/2013 patient underwent ultrasound-guided right breast core needle biopsy as well as left biopsy. 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 The lymph node was positive for metastatic disease. On the left patient had a biopsy that also showed invasive ductal carcinoma grade 3 ER positive PR negative HER-2/neu negative with a proliferation marker Ki-67 80%. Her clinical and radiologic staging was T2 N1 on the left side T2 Nx on the right side.    CURRENT THERAPY: Neoadjuvant Taxol given weekly   INTERVAL HISTORY: Paula Navarro is here today for evaluation prior to receiving her seventh cycle of weekly neoadjuvant Taxol.  She is tolerating the treatment well.  She has stable numbness in her fingertips that remains intermittent and is taking Super B complex daily.  She denies fevers, chills, nausea, vomiting, constipation, diarrhea, skin changes, or any further concerns.    REVIEW OF SYSTEMS: A 10 point review of systems was conducted and  is otherwise negative except for what is noted above.   PAST MEDICAL HISTORY:  Past Medical History   Diagnosis  Date   .  Chronic bronchitis    .  Palpitation      Tachycardia reported by monitor clerk during a symptomatic spell   .  Chest pain      Admitted to [facility] in 09/2011; refused stress test   .  Atrial septal defect  1996     Surgical repair in 1996   .  Tobacco abuse      60 pack years; 1.5 packs per day   .  Anxiety    .  Anemia    .  Breast cancer    PAST SURGICAL HISTORY:  Past Surgical History   Procedure  Laterality  Date   .  Cholecystectomy     .  Cesarean section     .  Vein bypass surgery     .  Breast biopsy  Bilateral    FAMILY HISTORY  Family History   Problem  Relation  Age of Onset   .  Breast cancer  Maternal Aunt    GYNECOLOGIC HISTORY: menarche at age 73, G34, p33, no h/o hormone replacement therapy, no h/o abnormal pap smears, or sexually transmitted infections  SOCIAL HISTORY: widowed, lives with mother and son, retired, enjoys functions at [employer], No ETOH, tobacco, or illicit drug use   ADVANCED DIRECTIVES: not in place  HEALTH MAINTENANCE:  History   Substance Use Topics   .  Smoking status:  Former Smoker -- 1.50 packs/day for 40 years     Types:  Cigarettes   .  Smokeless tobacco:  Never Used   .  Alcohol Use:  No   Mammogram: 06/2013  Colonoscopy: unknown, unsure  Bone Density Scan: never  Pap Smear: 2013  Eye Exam: never  Vitamin D Level: never  Lipid Panel: never  Allergies   Allergen  Reactions   .  Codeine  Other (See Comments)     Hot in chest    Current Outpatient Prescriptions   Medication  Sig  Dispense  Refill   .  B Complex-C (SUPER B COMPLEX PO)  Take 1 tablet by mouth daily.     .  clobetasol cream (TEMOVATE) 7.62 %  Apply 1 application topically 2 (two) times daily. Apply to hands and feet twice a day as needed.  30 g  1   .  lidocaine-prilocaine (EMLA) cream  Apply 1 application topically as needed.     Marland Kitchen  LORazepam  (ATIVAN) 1 MG tablet  Take 1 tablet (1 mg total) by mouth every 6 (six) hours as needed for anxiety.  30 tablet  1   .  dexamethasone (DECADRON) 4 MG tablet  Take 2 tables with a meal two times a day starting the day after chemo for two days.  30 tablet  2   .  fluconazole (DIFLUCAN) 100 MG tablet  Take 1 tablet (100 mg total) by mouth daily.  7 tablet  0   .  potassium chloride SA (K-DUR,KLOR-CON) 20 MEQ tablet  Take 1 tablet (20 mEq total) by mouth 2 (two) times daily. For 4 days  8 tablet  1   .  prochlorperazine (COMPAZINE) 10 MG tablet  TAKE 1 TABLET BY MOUTH EVERY 6 HOURS AS NEEDED FOR NAUSEA AND VOMITING  30 tablet  1    No current facility-administered medications for this visit.    Facility-Administered Medications Ordered in Other Visits   Medication  Dose  Route  Frequency  Provider  Last Rate  Last Dose   .  heparin lock flush 100 unit/mL  500 Units  Intracatheter  Once PRN  Chauncey Cruel, MD     .  PACLitaxel (TAXOL) 126 mg in dextrose 5 % 250 mL chemo infusion (</= 17m/m2)  80 mg/m2 (Treatment Plan Actual)  Intravenous  Once  GChauncey Cruel MD  271 mL/hr at 01/06/14 1119  126 mg at 01/06/14 1119   .  sodium chloride 0.9 % injection 10 mL  10 mL  Intracatheter  PRN  GChauncey Cruel MD     OBJECTIVE:  Filed Vitals:    01/27/2014   BP:  115/66  Pulse:  68  Temp:  98.3  Resp:  18  Body mass index is 24.12 kg/(m^2).  GENERAL: Patient is a well appearing female in no acute distress  HEENT: Sclerae anicteric.oropharyngeal candidiasis on tongue resolved. Neck is supple.  NODES: No cervical, supraclavicular, or axillary lymphadenopathy palpated.  BREAST EXAM: Deferred LUNGS: Clear to auscultation bilaterally. No wheezes or rhonchi.  HEART: Regular rate and rhythm. No murmur appreciated.  ABDOMEN: Soft, nontender. Positive, normoactive bowel sounds. No organomegaly palpated.  MSK: No focal spinal tenderness to palpation. Full range of motion bilaterally in the upper  extremities.  EXTREMITIES: No peripheral edema.  SKIN: Clear with no obvious rashes or skin changes. No nail dyscrasia.  NEURO: Nonfocal. Well oriented. Appropriate affect.  ECOG FS:1 - Symptomatic but completely ambulatory  LAB RESULTS:  CBC    Component Value Date/Time   WBC 6.4 01/27/2014 0840   WBC 14.6*  09/08/2013 1300   RBC 3.86 01/27/2014 0840   RBC 5.83* 09/08/2013 1300   HGB 9.3* 01/27/2014 0840   HGB 13.6 09/08/2013 1300   HCT 29.9* 01/27/2014 0840   HCT 41.0 09/08/2013 1300   PLT 282 01/27/2014 0840   PLT 306 09/08/2013 1300   MCV 77.5* 01/27/2014 0840   MCV 70.3* 09/08/2013 1300   MCH 24.2* 01/27/2014 0840   MCH 23.3* 09/08/2013 1300   MCHC 31.2* 01/27/2014 0840   MCHC 33.2 09/08/2013 1300   RDW 16.5* 01/27/2014 0840   RDW 14.7 09/08/2013 1300   LYMPHSABS 1.0 01/27/2014 0840   LYMPHSABS 4.3* 10/30/2011 1419   MONOABS 0.4 01/27/2014 0840   MONOABS 0.5 10/30/2011 1419   EOSABS 0.1 01/27/2014 0840   EOSABS 0.3 10/30/2011 1419   BASOSABS 0.0 01/27/2014 0840   BASOSABS 0.1 10/30/2011 1419   CMP     Component Value Date/Time   NA 142 01/27/2014 0840   NA 141 09/23/2012 0414   K 4.1 01/27/2014 0840   K 3.8 09/23/2012 0414   CL 107 09/23/2012 0414   CO2 27 01/27/2014 0840   CO2 29 10/30/2011 1419   GLUCOSE 135 01/27/2014 0840   GLUCOSE 192* 09/23/2012 0414   BUN 8.2 01/27/2014 0840   BUN 7 09/23/2012 0414   CREATININE 0.7 01/27/2014 0840   CREATININE 0.90 09/23/2012 0414   CALCIUM 9.3 01/27/2014 0840   CALCIUM 10.1 10/30/2011 1419   PROT 5.9* 01/27/2014 0840   ALBUMIN 3.3* 01/27/2014 0840   AST 22 01/27/2014 0840   ALT 27 01/27/2014 0840   ALKPHOS 47 01/27/2014 0840   BILITOT 0.42 01/27/2014 0840   GFRNONAA >90 10/30/2011 1419   GFRAA >90 10/30/2011 1419   No results found for this basename: SPEP, UPEP, kappa and lambda light chains    No results found for this basename: LABCA2    No components found with this basename: LABCA125   No results found for this basename: INR, in the last 168 hours  Urinalysis  No  results found for this basename: colorurine, appearanceur, labspec, phurine, glucoseu, hgbur, bilirubinur, ketonesur, proteinur, urobilinogen, nitrite, leukocytesur   ASSESSMENT: 63 y.o. with pathologic right breast T2 N1 stage II, invasive ductal carcinoma, grade III, ER positive, PR negative, Ki-67 20%, HER-2/neu negative, right breast.  1. Patient did undergo a PET CT on 08/22/2013 that demonstrated hypermetabolic breast masses, hypermetabolic lymphadenopathy, focal metabolic activity in the posterior right iliac bone, and hypermetabolism within the focus of the lower uterine segment. We will repeat an MRI of the pelvis following chmeotherapy  2. Patient was began on neoadjuvant treatment with chemotherapy consisting of Fluorouracil, Epirubicin, and Cyclophosphamide given on day 1 of a 14 day cycle with neulasta given on day 2 for granulocyte support from 09/15/13 through 11/25/13. She received 6 cycles.  3. The patient is on weekly Paclitaxel neoadjuvant chemotherapy started on 12/16/13.  4. Oral candidiasis - resolved. Still using nystatin swish and swallow as needed.    PLAN:  Mrs. Stene is doing well today and her labwork is stable.  She will proceed with cycle 7 of Taxol today.  I reviewed her labs with her in detail.  I ordered her bilateral breast MRI today to be scheduled towards the end of treatment.  Her anticipated chemotherapy completion date is 03/03/14. 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  Should her neuropathy progress, we will have to consider changing chemotherapy agents.   She has f/u with Dr. Marlou Starks  today.   The patient will return in one week for labs, evaluation, and week of Paclitaxel chemotherapy. We will continue to monitor her closely for any changes in neuropathy. She knows to call us in the interim for any questions or concerns. We can certainly see her sooner if needed.   I spent 25 minutes counseling the patient face to face.  The total time  spent in the appointment was 30 minutes.   Minette Headland, [place] [phone]  01/27/2014

## 2014-01-27 NOTE — Telephone Encounter (Signed)
tried to call pt to adv of the MRI time & date-the telephone states not a working # at this time-will mail sch to adv of MRI time & date

## 2014-01-27 NOTE — Patient Instructions (Signed)
You are doing well.  Your lab work is stable.  You will proceed with chemotherapy today.  I ordered a repeat MRI of your breasts to evaluate your response to treatment to be scheduled around 02/27/14.  Your anticipated chemotherapy completion date is approximately 03/03/14.   We will see you back next week for your eighth cycle of chemotherapy.  Please call us if you have any questions or concerns.

## 2014-01-29 ENCOUNTER — Other Ambulatory Visit: Payer: Self-pay | Admitting: Oncology

## 2014-02-01 ENCOUNTER — Other Ambulatory Visit: Payer: Self-pay | Admitting: Oncology

## 2014-02-02 ENCOUNTER — Telehealth: Payer: Self-pay

## 2014-02-02 ENCOUNTER — Other Ambulatory Visit: Payer: Self-pay | Admitting: *Deleted

## 2014-02-02 DIAGNOSIS — C50919 Malignant neoplasm of unspecified site of unspecified female breast: Secondary | ICD-10-CM

## 2014-02-02 MED ORDER — PROCHLORPERAZINE MALEATE 10 MG PO TABS
10.0000 mg | ORAL_TABLET | Freq: Four times a day (QID) | ORAL | Status: DC | PRN
Start: 2014-02-02 — End: 2014-05-20

## 2014-02-02 NOTE — Telephone Encounter (Signed)
Let pt know, per [facility] use of a hot tub is not advised while taking chemotherapy.  Pt voiced understanding.

## 2014-02-03 ENCOUNTER — Other Ambulatory Visit: Payer: Self-pay | Admitting: Oncology

## 2014-02-03 ENCOUNTER — Encounter: Payer: Self-pay | Admitting: Adult Health

## 2014-02-03 ENCOUNTER — Telehealth: Payer: Self-pay | Admitting: *Deleted

## 2014-02-03 ENCOUNTER — Other Ambulatory Visit (HOSPITAL_BASED_OUTPATIENT_CLINIC_OR_DEPARTMENT_OTHER): Payer: Medicare Other

## 2014-02-03 ENCOUNTER — Ambulatory Visit (HOSPITAL_BASED_OUTPATIENT_CLINIC_OR_DEPARTMENT_OTHER): Payer: Medicare Other

## 2014-02-03 ENCOUNTER — Ambulatory Visit (HOSPITAL_BASED_OUTPATIENT_CLINIC_OR_DEPARTMENT_OTHER): Payer: Medicare Other | Admitting: Adult Health

## 2014-02-03 VITALS — BP 117/72 | HR 89 | Temp 99.1°F | Resp 18 | Ht 60.0 in | Wt 124.7 lb

## 2014-02-03 DIAGNOSIS — C50912 Malignant neoplasm of unspecified site of left female breast: Principal | ICD-10-CM

## 2014-02-03 DIAGNOSIS — Z170 Estrogen receptor positive status [ER+]: Secondary | ICD-10-CM

## 2014-02-03 DIAGNOSIS — Z5111 Encounter for antineoplastic chemotherapy: Secondary | ICD-10-CM

## 2014-02-03 DIAGNOSIS — C50919 Malignant neoplasm of unspecified site of unspecified female breast: Secondary | ICD-10-CM

## 2014-02-03 DIAGNOSIS — C50911 Malignant neoplasm of unspecified site of right female breast: Secondary | ICD-10-CM

## 2014-02-03 LAB — COMPREHENSIVE METABOLIC PANEL (CC13)
ALBUMIN: 3.1 g/dL — AB (ref 3.5–5.0)
ALK PHOS: 47 U/L (ref 40–150)
ALT: 25 U/L (ref 0–55)
AST: 21 U/L (ref 5–34)
Anion Gap: 9 mEq/L (ref 3–11)
BUN: 8.6 mg/dL (ref 7.0–26.0)
CO2: 26 mEq/L (ref 22–29)
Calcium: 9.3 mg/dL (ref 8.4–10.4)
Chloride: 105 mEq/L (ref 98–109)
Creatinine: 0.7 mg/dL (ref 0.6–1.1)
Glucose: 157 mg/dl — ABNORMAL HIGH (ref 70–140)
POTASSIUM: 3.7 meq/L (ref 3.5–5.1)
SODIUM: 139 meq/L (ref 136–145)
Total Bilirubin: 0.34 mg/dL (ref 0.20–1.20)
Total Protein: 5.5 g/dL — ABNORMAL LOW (ref 6.4–8.3)

## 2014-02-03 LAB — CBC WITH DIFFERENTIAL/PLATELET
BASO%: 0.4 % (ref 0.0–2.0)
BASOS ABS: 0 10*3/uL (ref 0.0–0.1)
EOS%: 0.9 % (ref 0.0–7.0)
Eosinophils Absolute: 0.1 10*3/uL (ref 0.0–0.5)
HCT: 26.3 % — ABNORMAL LOW (ref 34.8–46.6)
HGB: 8.2 g/dL — ABNORMAL LOW (ref 11.6–15.9)
LYMPH%: 13.7 % — ABNORMAL LOW (ref 14.0–49.7)
MCH: 24.1 pg — AB (ref 25.1–34.0)
MCHC: 31.1 g/dL — AB (ref 31.5–36.0)
MCV: 77.4 fL — AB (ref 79.5–101.0)
MONO#: 0.5 10*3/uL (ref 0.1–0.9)
MONO%: 5.8 % (ref 0.0–14.0)
NEUT#: 6.8 10*3/uL — ABNORMAL HIGH (ref 1.5–6.5)
NEUT%: 79.2 % — ABNORMAL HIGH (ref 38.4–76.8)
Platelets: 272 10*3/uL (ref 145–400)
RBC: 3.4 10*6/uL — AB (ref 3.70–5.45)
RDW: 16.8 % — AB (ref 11.2–14.5)
WBC: 8.6 10*3/uL (ref 3.9–10.3)
lymph#: 1.2 10*3/uL (ref 0.9–3.3)

## 2014-02-03 MED ORDER — DIPHENHYDRAMINE HCL 50 MG/ML IJ SOLN
25.0000 mg | Freq: Once | INTRAMUSCULAR | Status: AC
  Administered 2014-02-03: 25 mg via INTRAVENOUS

## 2014-02-03 MED ORDER — PACLITAXEL CHEMO INJECTION 300 MG/50ML
80.0000 mg/m2 | Freq: Once | INTRAVENOUS | Status: AC
  Administered 2014-02-03: 126 mg via INTRAVENOUS
  Filled 2014-02-03: qty 21

## 2014-02-03 MED ORDER — SODIUM CHLORIDE 0.9 % IJ SOLN
10.0000 mL | INTRAMUSCULAR | Status: DC | PRN
  Administered 2014-02-03: 10 mL
  Filled 2014-02-03: qty 10

## 2014-02-03 MED ORDER — DEXAMETHASONE SODIUM PHOSPHATE 20 MG/5ML IJ SOLN
INTRAMUSCULAR | Status: AC
  Filled 2014-02-03: qty 5

## 2014-02-03 MED ORDER — DIPHENHYDRAMINE HCL 50 MG/ML IJ SOLN
INTRAMUSCULAR | Status: AC
  Filled 2014-02-03: qty 1

## 2014-02-03 MED ORDER — FAMOTIDINE IN NACL 20-0.9 MG/50ML-% IV SOLN
20.0000 mg | Freq: Once | INTRAVENOUS | Status: AC
  Administered 2014-02-03: 20 mg via INTRAVENOUS

## 2014-02-03 MED ORDER — SODIUM CHLORIDE 0.9 % IV SOLN
Freq: Once | INTRAVENOUS | Status: AC
  Administered 2014-02-03: 11:00:00 via INTRAVENOUS

## 2014-02-03 MED ORDER — FAMOTIDINE IN NACL 20-0.9 MG/50ML-% IV SOLN
INTRAVENOUS | Status: AC
  Filled 2014-02-03: qty 50

## 2014-02-03 MED ORDER — HEPARIN SOD (PORK) LOCK FLUSH 100 UNIT/ML IV SOLN
500.0000 [IU] | Freq: Once | INTRAVENOUS | Status: AC | PRN
  Administered 2014-02-03: 500 [IU]
  Filled 2014-02-03: qty 5

## 2014-02-03 MED ORDER — ONDANSETRON 8 MG/50ML IVPB (CHCC)
8.0000 mg | Freq: Once | INTRAVENOUS | Status: AC
  Administered 2014-02-03: 8 mg via INTRAVENOUS

## 2014-02-03 MED ORDER — DEXAMETHASONE SODIUM PHOSPHATE 20 MG/5ML IJ SOLN
20.0000 mg | Freq: Once | INTRAMUSCULAR | Status: AC
  Administered 2014-02-03: 20 mg via INTRAVENOUS

## 2014-02-03 MED ORDER — ONDANSETRON 8 MG/NS 50 ML IVPB
INTRAVENOUS | Status: AC
  Filled 2014-02-03: qty 8

## 2014-02-03 NOTE — Progress Notes (Signed)
ID: Paula Navarro OB: [birth year] MR#: [id number] CSN#:[id number]  PCP: Lanette Hampshire, MD  GYN:  SU: Dr Marlou Starks  OTHER MD:  CHIEF COMPLAINT: 63 y/o female with bilateral breast cancer undergoing neoadjuvant chemotherapy  BREAST CANCER HISTORY:  Patient developed pain in her breasts and palpated masses. Because of this she went on to have further workup performed including mammograms and ultrasound. On 07/23/2013 patient had bilateral diagnostic mammogram she was noted to have bilateral breast masses on the right irregular mass lying posterior lateral breast associated with a large rounded and dense axillary nodes. The left showed slightly smaller irregular mass lying at the 11:00 to 12:00 position. There was a smaller adjacent cyst. Also noted to have a large dense axillary lymph nodes on the left. Because of this she was recommended ultrasound and biopsies. 08/07/2013 patient underwent ultrasound-guided right breast core needle biopsy as well as left biopsy. 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 The lymph node was positive for metastatic disease. On the left patient had a biopsy that also showed invasive ductal carcinoma grade 3 ER positive PR negative HER-2/neu negative with a proliferation marker Ki-67 80%. Her clinical and radiologic staging was T2 N1 on the left side T2 Nx on the right side.    CURRENT THERAPY: Neoadjuvant Taxol given weekly   INTERVAL HISTORY: [name] is here today for evaluation prior to receiving her eighth cycle of weekly neoadjuvant Taxol.  She is tolerating the treatment well.  She is experiencing numbness in her fingertips that continues to be intermittent and is taking super b complex daily for this.  She denies fevers, chills, nausea, vomiting, constipation, diarrhea, skin changes, or any further concerns.    REVIEW OF SYSTEMS: A 10 point review of  systems was conducted and is otherwise negative except for what is noted above.   PAST MEDICAL HISTORY:  Past Medical History   Diagnosis  Date   .  Chronic bronchitis    .  Palpitation      Tachycardia reported by monitor clerk during a symptomatic spell   .  Chest pain      Admitted to [facility] in 09/2011; refused stress test   .  Atrial septal defect  1996     Surgical repair in 1996   .  Tobacco abuse      60 pack years; 1.5 packs per day   .  Anxiety    .  Anemia    .  Breast cancer    PAST SURGICAL HISTORY:  Past Surgical History   Procedure  Laterality  Date   .  Cholecystectomy     .  Cesarean section     .  Vein bypass surgery     .  Breast biopsy  Bilateral    FAMILY HISTORY  Family History   Problem  Relation  Age of Onset   .  Breast cancer  Maternal Aunt    GYNECOLOGIC HISTORY: menarche at age 67, G17, p51, no h/o hormone replacement therapy, no h/o abnormal pap smears, or sexually transmitted infections  SOCIAL HISTORY: widowed, lives with mother and son, retired, enjoys functions at [employer], No ETOH, tobacco, or illicit drug use   ADVANCED DIRECTIVES: not in place  HEALTH MAINTENANCE:  History   Substance Use Topics   .  Smoking status:  Former Smoker -- 1.50 packs/day for 40 years     Types:  Cigarettes   .  Smokeless tobacco:  Never Used   .  Alcohol Use:  No   Mammogram: 06/2013  Colonoscopy: unknown, unsure  Bone Density Scan: never  Pap Smear: 2013  Eye Exam: never  Vitamin D Level: never  Lipid Panel: never  Allergies   Allergen  Reactions   .  Codeine  Other (See Comments)     Hot in chest    Current Outpatient Prescriptions   Medication  Sig  Dispense  Refill   .  B Complex-C (SUPER B COMPLEX PO)  Take 1 tablet by mouth daily.     .  clobetasol cream (TEMOVATE) 5.17 %  Apply 1 application topically 2 (two) times daily. Apply to hands and feet twice a day as needed.  30 g  1   .  lidocaine-prilocaine (EMLA) cream  Apply 1 application topically as  needed.     Marland Kitchen  LORazepam (ATIVAN) 1 MG tablet  Take 1 tablet (1 mg total) by mouth every 6 (six) hours as needed for anxiety.  30 tablet  1   .  dexamethasone (DECADRON) 4 MG tablet  Take 2 tables with a meal two times a day starting the day after chemo for two days.  30 tablet  2   .  fluconazole (DIFLUCAN) 100 MG tablet  Take 1 tablet (100 mg total) by mouth daily.  7 tablet  0   .  potassium chloride SA (K-DUR,KLOR-CON) 20 MEQ tablet  Take 1 tablet (20 mEq total) by mouth 2 (two) times daily. For 4 days  8 tablet  1   .  prochlorperazine (COMPAZINE) 10 MG tablet  TAKE 1 TABLET BY MOUTH EVERY 6 HOURS AS NEEDED FOR NAUSEA AND VOMITING  30 tablet  1    No current facility-administered medications for this visit.    Facility-Administered Medications Ordered in Other Visits   Medication  Dose  Route  Frequency  Provider  Last Rate  Last Dose   .  heparin lock flush 100 unit/mL  500 Units  Intracatheter  Once PRN  Chauncey Cruel, MD     .  PACLitaxel (TAXOL) 126 mg in dextrose 5 % 250 mL chemo infusion (</= 82m/m2)  80 mg/m2 (Treatment Plan Actual)  Intravenous  Once  GChauncey Cruel MD  271 mL/hr at 01/06/14 1119  126 mg at 01/06/14 1119   .  sodium chloride 0.9 % injection 10 mL  10 mL  Intracatheter  PRN  GChauncey Cruel MD     OBJECTIVE:  Filed Vitals:    02/03/2014   BP:  115/66  Pulse:  68  Temp:  98.3  Resp:  18  Body mass index is 24.12 kg/(m^2).  GENERAL: Patient is a well appearing female in no acute distress  HEENT: Sclerae anicteric.oropharyngeal candidiasis on tongue resolved. Neck is supple.  NODES: No cervical, supraclavicular, or axillary lymphadenopathy palpated.  BREAST EXAM: Deferred LUNGS: Clear to auscultation bilaterally. No wheezes or rhonchi.  HEART: Regular rate and rhythm. No murmur appreciated.  ABDOMEN: Soft, nontender. Positive, normoactive bowel sounds. No organomegaly palpated.  MSK: No focal spinal tenderness to palpation. Full range of motion  bilaterally in the upper extremities.  EXTREMITIES: No peripheral edema.  SKIN: Clear with no obvious rashes or skin changes. No nail dyscrasia.  NEURO: Nonfocal. Well oriented. Appropriate affect.  ECOG FS:1 - Symptomatic but completely ambulatory  LAB RESULTS:  CBC    Component Value Date/Time   WBC 8.6 02/03/2014  0900   WBC 14.6* 09/08/2013 1300   RBC 3.40* 02/03/2014 0900   RBC 5.83* 09/08/2013 1300   HGB 8.2* 02/03/2014 0900   HGB 13.6 09/08/2013 1300   HCT 26.3* 02/03/2014 0900   HCT 41.0 09/08/2013 1300   PLT 272 02/03/2014 0900   PLT 306 09/08/2013 1300   MCV 77.4* 02/03/2014 0900   MCV 70.3* 09/08/2013 1300   MCH 24.1* 02/03/2014 0900   MCH 23.3* 09/08/2013 1300   MCHC 31.1* 02/03/2014 0900   MCHC 33.2 09/08/2013 1300   RDW 16.8* 02/03/2014 0900   RDW 14.7 09/08/2013 1300   LYMPHSABS 1.2 02/03/2014 0900   LYMPHSABS 4.3* 10/30/2011 1419   MONOABS 0.5 02/03/2014 0900   MONOABS 0.5 10/30/2011 1419   EOSABS 0.1 02/03/2014 0900   EOSABS 0.3 10/30/2011 1419   BASOSABS 0.0 02/03/2014 0900   BASOSABS 0.1 10/30/2011 1419   CMP     Component Value Date/Time   NA 139 02/03/2014 0900   NA 141 09/23/2012 0414   K 3.7 02/03/2014 0900   K 3.8 09/23/2012 0414   CL 107 09/23/2012 0414   CO2 26 02/03/2014 0900   CO2 29 10/30/2011 1419   GLUCOSE 157* 02/03/2014 0900   GLUCOSE 192* 09/23/2012 0414   BUN 8.6 02/03/2014 0900   BUN 7 09/23/2012 0414   CREATININE 0.7 02/03/2014 0900   CREATININE 0.90 09/23/2012 0414   CALCIUM 9.3 02/03/2014 0900   CALCIUM 10.1 10/30/2011 1419   PROT 5.5* 02/03/2014 0900   ALBUMIN 3.1* 02/03/2014 0900   AST 21 02/03/2014 0900   ALT 25 02/03/2014 0900   ALKPHOS 47 02/03/2014 0900   BILITOT 0.34 02/03/2014 0900   GFRNONAA >90 10/30/2011 1419   GFRAA >90 10/30/2011 1419   No results found for this basename: SPEP, UPEP, kappa and lambda light chains    No results found for this basename: LABCA2    No components found with this basename: LABCA125   No results found for this basename: INR, in the last 168 hours  Urinalysis   No results found for this basename: colorurine, appearanceur, labspec, phurine, glucoseu, hgbur, bilirubinur, ketonesur, proteinur, urobilinogen, nitrite, leukocytesur   ASSESSMENT: 63 y.o. with pathologic right breast T2 N1 stage II, invasive ductal carcinoma, grade III, ER positive, PR negative, Ki-67 20%, HER-2/neu negative, right breast.  1. Patient did undergo a PET CT on 08/22/2013 that demonstrated hypermetabolic breast masses, hypermetabolic lymphadenopathy, focal metabolic activity in the posterior right iliac bone, and hypermetabolism within the focus of the lower uterine segment. We will repeat an MRI of the pelvis following chmeotherapy  2. Patient was began on neoadjuvant treatment with chemotherapy consisting of Fluorouracil, Epirubicin, and Cyclophosphamide given on day 1 of a 14 day cycle with neulasta given on day 2 for granulocyte support from 09/15/13 through 11/25/13. She received 6 cycles.  3. The patient is on weekly Paclitaxel neoadjuvant chemotherapy started on 12/16/13.  4. Oral candidiasis - resolved. Still using nystatin swish and swallow as needed.    PLAN:  Mrs. Wubben is doing well today and her labwork is stable.  I reviewed her lab work with her in detail.  She will proceed with cycle 8 of Taxol today.  MRI of her breasts is scheduled for 02/27/14.  Her anticipated chemotherapy completion date is 03/03/14. 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  Should her neuropathy progress, we will have to consider changing chemotherapy agents.  I discussed her neuropathy with her in detail  and the fact that she needs to tell me if it does worsen so we can change her treatment to prevent an irreversible change in the feeling of her fingertips or toes.  She verbalized understanding of the plan.   The patient will return in one week for labs, evaluation, and week 9 of Paclitaxel chemotherapy. We will continue to monitor her closely for any changes in  neuropathy. She knows to call us in the interim for any questions or concerns. We can certainly see her sooner if needed.   I spent 25 minutes counseling the patient face to face.  The total time spent in the appointment was 30 minutes.  Minette Headland, [place] [phone]

## 2014-02-03 NOTE — Patient Instructions (Addendum)
[facility] Discharge Instructions for Patients Receiving Chemotherapy  Today you received the following chemotherapy agents: Taxol.  To help prevent nausea and vomiting after your treatment, we encourage you to take your nausea medication as prescribed.   If you develop nausea and vomiting that is not controlled by your nausea medication, call the clinic.   BELOW ARE SYMPTOMS THAT SHOULD BE REPORTED IMMEDIATELY:  *FEVER GREATER THAN 100.5 F  *CHILLS WITH OR WITHOUT FEVER  NAUSEA AND VOMITING THAT IS NOT CONTROLLED WITH YOUR NAUSEA MEDICATION  *UNUSUAL SHORTNESS OF BREATH  *UNUSUAL BRUISING OR BLEEDING  TENDERNESS IN MOUTH AND THROAT WITH OR WITHOUT PRESENCE OF ULCERS  *URINARY PROBLEMS  *BOWEL PROBLEMS  UNUSUAL RASH Items with * indicate a potential emergency and should be followed up as soon as possible.  Feel free to call the clinic you have any questions or concerns. The clinic phone number is [phone].    

## 2014-02-03 NOTE — Telephone Encounter (Signed)
Per staff message I have adjusted 7/21 appt

## 2014-02-03 NOTE — Progress Notes (Signed)
Ok to treat with single agent Taxol with hbg 8.2 today per Charlestine Massed NP.

## 2014-02-06 ENCOUNTER — Other Ambulatory Visit: Payer: Self-pay | Admitting: Oncology

## 2014-02-07 ENCOUNTER — Emergency Department (HOSPITAL_COMMUNITY)
Admission: EM | Admit: 2014-02-07 | Discharge: 2014-02-07 | Disposition: A | Discharge status: Home / Self Care | Payer: Medicare Other | Attending: Emergency Medicine | Admitting: Emergency Medicine

## 2014-02-07 ENCOUNTER — Emergency Department (HOSPITAL_COMMUNITY): Payer: Medicare Other

## 2014-02-07 ENCOUNTER — Encounter (HOSPITAL_COMMUNITY): Payer: Self-pay | Admitting: Emergency Medicine

## 2014-02-07 DIAGNOSIS — S79929A Unspecified injury of unspecified thigh, initial encounter: Secondary | ICD-10-CM

## 2014-02-07 DIAGNOSIS — S79919A Unspecified injury of unspecified hip, initial encounter: Secondary | ICD-10-CM | POA: Insufficient documentation

## 2014-02-07 DIAGNOSIS — Z87891 Personal history of nicotine dependence: Secondary | ICD-10-CM | POA: Insufficient documentation

## 2014-02-07 DIAGNOSIS — IMO0002 Reserved for concepts with insufficient information to code with codable children: Secondary | ICD-10-CM | POA: Insufficient documentation

## 2014-02-07 DIAGNOSIS — Z853 Personal history of malignant neoplasm of breast: Secondary | ICD-10-CM | POA: Insufficient documentation

## 2014-02-07 DIAGNOSIS — S298XXA Other specified injuries of thorax, initial encounter: Secondary | ICD-10-CM | POA: Insufficient documentation

## 2014-02-07 DIAGNOSIS — Y9389 Activity, other specified: Secondary | ICD-10-CM | POA: Insufficient documentation

## 2014-02-07 DIAGNOSIS — T07XXXA Unspecified multiple injuries, initial encounter: Secondary | ICD-10-CM

## 2014-02-07 DIAGNOSIS — Z862 Personal history of diseases of the blood and blood-forming organs and certain disorders involving the immune mechanism: Secondary | ICD-10-CM | POA: Insufficient documentation

## 2014-02-07 DIAGNOSIS — Y929 Unspecified place or not applicable: Secondary | ICD-10-CM | POA: Insufficient documentation

## 2014-02-07 DIAGNOSIS — W1809XA Striking against other object with subsequent fall, initial encounter: Secondary | ICD-10-CM | POA: Insufficient documentation

## 2014-02-07 DIAGNOSIS — F411 Generalized anxiety disorder: Secondary | ICD-10-CM | POA: Insufficient documentation

## 2014-02-07 DIAGNOSIS — Z79899 Other long term (current) drug therapy: Secondary | ICD-10-CM | POA: Insufficient documentation

## 2014-02-07 DIAGNOSIS — S60229A Contusion of unspecified hand, initial encounter: Secondary | ICD-10-CM | POA: Insufficient documentation

## 2014-02-07 MED ORDER — HYDROCODONE-ACETAMINOPHEN 5-325 MG PO TABS: 1.0000 | ORAL_TABLET | ORAL | Status: DC | PRN

## 2014-02-07 MED ORDER — HYDROCODONE-ACETAMINOPHEN 5-325 MG PO TABS
1.0000 | ORAL_TABLET | Freq: Once | ORAL | Status: AC
  Administered 2014-02-07: 1 via ORAL
  Filled 2014-02-07: qty 1

## 2014-02-07 MED ORDER — NAPROXEN 375 MG PO TABS: 375.0000 mg | ORAL_TABLET | Freq: Two times a day (BID) | ORAL | Status: DC

## 2014-02-07 NOTE — ED Provider Notes (Addendum)
CSN: [id number]     Arrival date & time 02/07/14  53 History   First MD Initiated Contact with Patient 02/07/14 1520     Chief Complaint  Patient presents with  . Fall     (Consider location/radiation/quality/duration/timing/severity/associated sxs/prior Treatment) Patient is a 63 y.o. female presenting with fall. The history is provided by the patient.  Fall This is a new problem. Episode onset: 5 days ago. The problem occurs constantly. The problem has been unchanged. Associated symptoms include arthralgias and numbness. Pertinent negatives include no abdominal pain, chest pain, coughing, diaphoresis, fever, headaches, neck pain, vertigo, visual change or vomiting. Exacerbated by: movement and palpation worsens pain. She has tried nothing for the symptoms.     Paula Navarro is a 63 y.o. female presenting with persistent pain in her left hand, left hip, chest rib cage and left shoulder after falling 5 days ago.  She describes falling as she was trying to swing her leg into her hottub.  She fell out,  Landing on her left side. She was sore initially, but has not improved so presents today for evaluation.  She denies weakness in her extremities.  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    Past Medical History  Diagnosis Date  . Chronic bronchitis   . Palpitation     Tachycardia reported by monitor clerk during a symptomatic spell  . Chest pain     Admitted to [facility] in 09/2011; refused stress test  . Atrial septal defect 1996    Surgical repair in 1996  . Tobacco abuse     60 pack years; 1.5 packs per day  . Anxiety   . Anemia   . Breast cancer    Past Surgical History  Procedure Laterality Date  . Cholecystectomy    . Cesarean section    . Vein bypass surgery    . Breast biopsy Bilateral    Family History  Problem Relation Age of Onset  . Breast cancer Maternal Aunt    History  Substance Use Topics  .  Smoking status: Former Smoker -- 1.50 packs/day for 40 years    Types: Cigarettes  . Smokeless tobacco: Never Used  . Alcohol Use: No   OB History   Grav Para Term Preterm Abortions TAB SAB Ect Mult Living                 Review of Systems  Constitutional: Negative for fever and diaphoresis.  HENT: Negative.   Respiratory: Negative for cough and chest tightness.   Cardiovascular: Negative for chest pain.  Gastrointestinal: Negative for vomiting and abdominal pain.  Musculoskeletal: Positive for arthralgias. Negative for neck pain.  Skin: Negative for wound.  Neurological: Positive for numbness. Negative for dizziness, vertigo and headaches.       Reports chronic numbness in fingers and toes since starting chemotherapy.      Allergies  Codeine  Home Medications   Prior to Admission medications   Medication Sig Start Date End Date Taking? Authorizing Provider  B Complex-C (SUPER B COMPLEX PO) Take 1 tablet by mouth daily.   Yes Historical Provider, MD  clobetasol cream (TEMOVATE) 3.61 % Apply 1 application topically 2 (two) times daily. Apply to hands and feet twice a day as needed. 12/30/13  Yes Maryanna Shape, NP  LORazepam (ATIVAN) 1 MG tablet Take 1 tablet (1 mg total) by mouth every 6 (six) hours as needed for anxiety.  12/26/13  Yes Minette Headland, NP  nystatin (MYCOSTATIN) 100000 UNIT/ML suspension Take 5 mLs (500,000 Units total) by mouth 4 (four) times daily. X 1 week. 01/13/14  Yes Maryanna Shape, NP  prochlorperazine (COMPAZINE) 10 MG tablet Take 1 tablet (10 mg total) by mouth every 6 (six) hours as needed for nausea or vomiting. 02/02/14  Yes Minette Headland, NP  dexamethasone (DECADRON) 4 MG tablet Take 2 tables with a meal two times a day starting the day after chemo for two days. 12/16/13   Minette Headland, NP  HYDROcodone-acetaminophen (NORCO/VICODIN) 5-325 MG per tablet Take 1 tablet by mouth every 4 (four) hours as needed. 02/07/14   Evalee Jefferson, PA-C   lidocaine-prilocaine (EMLA) cream Apply 1 application topically as needed. 01/20/14   Maryanna Shape, NP  naproxen (NAPROSYN) 375 MG tablet Take 1 tablet (375 mg total) by mouth 2 (two) times daily. 02/07/14   Evalee Jefferson, PA-C   BP 142/93  Pulse 95  Temp(Src) 98.9 F (37.2 C) (Oral)  Resp 18  Ht 5\' 2"  (1.575 m)  Wt 124 lb (56.246 kg)  BMI 22.67 kg/m2  SpO2 99%  LMP 05/20/2011 Physical Exam  Nursing note and vitals reviewed. Constitutional: She appears well-developed and well-nourished.  HENT:  Head: Normocephalic and atraumatic.  Eyes: Conjunctivae are normal.  Neck: Normal range of motion.  Cardiovascular: Normal rate, regular rhythm, normal heart sounds and intact distal pulses.   Pulmonary/Chest: Effort normal and breath sounds normal. She has no wheezes. She exhibits tenderness.  ttp left lateral chest wall.  No ecchymosis.  No stepoffs.    Abdominal: Soft. Bowel sounds are normal. There is no tenderness.  Musculoskeletal: Normal range of motion.       Left hand: She exhibits bony tenderness. Normal sensation noted. Normal strength noted.  ttp left dorsal hand,  Contused without edema.  Left lateral hip tender over greater trochanter.  No pain with internal and external rotation.    Neurological: She is alert.  Skin: Skin is warm and dry.  Psychiatric: She has a normal mood and affect.    ED Course  Procedures (including critical care time) Labs Review Labs Reviewed - No data to display  Imaging Review Dg Ribs Unilateral W/chest Left  02/07/2014   CLINICAL DATA:  Fall.  Pain left ribs.  History of breast cancer.  EXAM: LEFT RIBS AND CHEST - 3+ VIEW  COMPARISON:  None.  FINDINGS: Prior median sternotomy. Prominent right hilum corresponds with a prominent right pulmonary artery, when correlated with prior chest CT. Right IJ power Port-A-Cath projects over the distal superior vena cava. Negative for airspace disease, pleural effusion, or pneumothorax.  Remote, healed  fractures are seen in the left third, fifth, and sixth ribs.  No acute left rib fracture is identified. No suspicious bony lesion.  Single surgical clip noted in the right upper quadrant. Nonobstructive bowel gas pattern.  IMPRESSION: 1. No acute left rib fracture is identified. 2. Several remote healed left rib fractures, stable. 3. No acute findings in the chest. 4. Satisfactory position of Port-A-Cath.   Electronically Signed   By: Curlene Dolphin M.D.   On: 02/07/2014 16:56   Dg Hip Complete Left  02/07/2014   CLINICAL DATA:  Fall.  Pain left ischial tuberosity.  EXAM: LEFT HIP - COMPLETE 2+ VIEW  COMPARISON:  CT chest abdomen pelvis 08/22/2013  FINDINGS: There is no evidence of hip fracture or dislocation. There is no evidence of arthropathy or other focal  bone abnormality.  IMPRESSION: Negative.   Electronically Signed   By: Curlene Dolphin M.D.   On: 02/07/2014 16:57   Dg Shoulder Left  02/07/2014   CLINICAL DATA:  Fall.  EXAM: LEFT SHOULDER - 2+ VIEW  COMPARISON:  CT 08/22/2013.  FINDINGS: Degenerative changes left shoulder. No evidence of acute fracture or dislocation. Port-A-Cath noted in good anatomic position. Prior median sternotomy. Old left rib fractures.  IMPRESSION: No acute abnormality.  Old left rib fractures.   Electronically Signed   By: Marcello Moores  Register   On: 02/07/2014 16:53   Dg Hand Complete Left  02/07/2014   CLINICAL DATA:  Fall.  EXAM: LEFT HAND - COMPLETE 3+ VIEW  COMPARISON:  None.  FINDINGS: No acute bony or joint abnormality. Metallic densities are noted over the distal portion of the left fourth digit on one view only. This may represent film debris.  IMPRESSION: No acute bony or joint abnormality.   Electronically Signed   By: Marcello Moores  Register   On: 02/07/2014 16:58     EKG Interpretation None      MDM   Final diagnoses:  Contusion of multiple sites    Patients labs and/or radiological studies were viewed and considered during the medical decision making and  disposition process.  Pt prescribed hydrocodone, naproxen.  Heat tx.  F/u with pcp if not improved over the next week.  The patient appears reasonably screened and/or stabilized for discharge and I doubt any other medical condition or other [facility] requiring further screening, evaluation, or treatment in the ED at this time prior to discharge.     Evalee Jefferson, PA-C 02/07/14 [place], PA-C 02/13/14 [phone]

## 2014-02-07 NOTE — Discharge Instructions (Signed)
Contusion A contusion is a deep bruise. Contusions are the result of an injury that caused bleeding under the skin. The contusion may turn blue, purple, or yellow. Minor injuries will give you a painless contusion, but more severe contusions may stay painful and swollen for a few weeks.  CAUSES  A contusion is usually caused by a blow, trauma, or direct force to an area of the body. SYMPTOMS   Swelling and redness of the injured area.  Bruising of the injured area.  Tenderness and soreness of the injured area.  Pain. DIAGNOSIS  The diagnosis can be made by taking a history and physical exam. An X-ray, CT scan, or MRI may be needed to determine if there were any associated injuries, such as fractures. TREATMENT  Specific treatment will depend on what area of the body was injured. In general, the best treatment for a contusion is resting, icing, elevating, and applying cold compresses to the injured area. Over-the-counter medicines may also be recommended for pain control. Ask your caregiver what the best treatment is for your contusion. HOME CARE INSTRUCTIONS   Put ice on the injured area.  Put ice in a plastic bag.  Place a towel between your skin and the bag.  Leave the ice on for 15-20 minutes, 3-4 times a day, or as directed by your health care provider.  Only take over-the-counter or prescription medicines for pain, discomfort, or fever as directed by your caregiver. Your caregiver may recommend avoiding anti-inflammatory medicines (aspirin, ibuprofen, and naproxen) for 48 hours because these medicines may increase bruising.  Rest the injured area.  If possible, elevate the injured area to reduce swelling. SEEK IMMEDIATE MEDICAL CARE IF:   You have increased bruising or swelling.  You have pain that is getting worse.  Your swelling or pain is not relieved with medicines. MAKE SURE YOU:   Understand these instructions.  Will watch your condition.  Will get help right  away if you are not doing well or get worse. Document Released: 04/26/2005 Document Revised: 07/22/2013 Document Reviewed: 05/22/2011 [facility] Patient Information 2015 [place], [place]. This information is not intended to replace advice given to you by your health care provider. Make sure you discuss any questions you have with your health care provider.    Your xrays today are negative for any signs of bony injury (no fractures).  Use the medicines prescribed.  Use caution taking hydrocodone as this medicine will make you sleepy. Do not drive within 4 hours of taking this medicine.  You may benefit from heat therapy - apply a heating pad to your areas of pain 15 minutes 3-4 times daily.

## 2014-02-07 NOTE — ED Provider Notes (Signed)
Medical screening examination/treatment/procedure(s) were performed by non-physician practitioner and as supervising physician I was immediately available for consultation/collaboration.   EKG Interpretation None        Maudry Diego, MD 02/07/14 2136

## 2014-02-07 NOTE — ED Notes (Signed)
Pt states that she fell Monday evening when she lost her balance after attempting to step up on steps. Pt states that she hit her left side against the cement, pt complains of pain to buttock area and left rib cage area. Denies any LOC.

## 2014-02-09 ENCOUNTER — Ambulatory Visit (INDEPENDENT_AMBULATORY_CARE_PROVIDER_SITE_OTHER): Payer: Medicare Other | Admitting: General Surgery

## 2014-02-10 ENCOUNTER — Ambulatory Visit (HOSPITAL_BASED_OUTPATIENT_CLINIC_OR_DEPARTMENT_OTHER): Payer: Medicare Other | Admitting: Adult Health

## 2014-02-10 ENCOUNTER — Other Ambulatory Visit: Payer: Self-pay | Admitting: *Deleted

## 2014-02-10 ENCOUNTER — Encounter: Payer: Self-pay | Admitting: Adult Health

## 2014-02-10 ENCOUNTER — Ambulatory Visit: Payer: Medicare Other

## 2014-02-10 ENCOUNTER — Telehealth: Payer: Self-pay | Admitting: Adult Health

## 2014-02-10 ENCOUNTER — Telehealth (INDEPENDENT_AMBULATORY_CARE_PROVIDER_SITE_OTHER): Payer: Self-pay

## 2014-02-10 ENCOUNTER — Other Ambulatory Visit (HOSPITAL_BASED_OUTPATIENT_CLINIC_OR_DEPARTMENT_OTHER): Payer: Medicare Other

## 2014-02-10 VITALS — BP 129/75 | HR 95 | Temp 98.3°F | Resp 18 | Ht 62.0 in | Wt 120.7 lb

## 2014-02-10 DIAGNOSIS — C50919 Malignant neoplasm of unspecified site of unspecified female breast: Secondary | ICD-10-CM

## 2014-02-10 DIAGNOSIS — Z170 Estrogen receptor positive status [ER+]: Secondary | ICD-10-CM

## 2014-02-10 DIAGNOSIS — C773 Secondary and unspecified malignant neoplasm of axilla and upper limb lymph nodes: Secondary | ICD-10-CM

## 2014-02-10 DIAGNOSIS — C50912 Malignant neoplasm of unspecified site of left female breast: Principal | ICD-10-CM

## 2014-02-10 DIAGNOSIS — B370 Candidal stomatitis: Secondary | ICD-10-CM

## 2014-02-10 DIAGNOSIS — D509 Iron deficiency anemia, unspecified: Secondary | ICD-10-CM

## 2014-02-10 DIAGNOSIS — C50911 Malignant neoplasm of unspecified site of right female breast: Secondary | ICD-10-CM

## 2014-02-10 DIAGNOSIS — G609 Hereditary and idiopathic neuropathy, unspecified: Secondary | ICD-10-CM

## 2014-02-10 LAB — CBC WITH DIFFERENTIAL/PLATELET
BASO%: 0.1 % (ref 0.0–2.0)
Basophils Absolute: 0 10*3/uL (ref 0.0–0.1)
EOS%: 0.9 % (ref 0.0–7.0)
Eosinophils Absolute: 0.1 10*3/uL (ref 0.0–0.5)
HCT: 26.7 % — ABNORMAL LOW (ref 34.8–46.6)
HGB: 8.3 g/dL — ABNORMAL LOW (ref 11.6–15.9)
LYMPH%: 15.2 % (ref 14.0–49.7)
MCH: 23.7 pg — AB (ref 25.1–34.0)
MCHC: 31.1 g/dL — ABNORMAL LOW (ref 31.5–36.0)
MCV: 76.3 fL — AB (ref 79.5–101.0)
MONO#: 0.8 10*3/uL (ref 0.1–0.9)
MONO%: 8.5 % (ref 0.0–14.0)
NEUT#: 7.2 10*3/uL — ABNORMAL HIGH (ref 1.5–6.5)
NEUT%: 75.3 % (ref 38.4–76.8)
Platelets: 271 10*3/uL (ref 145–400)
RBC: 3.5 10*6/uL — ABNORMAL LOW (ref 3.70–5.45)
RDW: 17.6 % — AB (ref 11.2–14.5)
WBC: 9.5 10*3/uL (ref 3.9–10.3)
lymph#: 1.5 10*3/uL (ref 0.9–3.3)

## 2014-02-10 LAB — COMPREHENSIVE METABOLIC PANEL (CC13)
ALK PHOS: 56 U/L (ref 40–150)
ALT: 26 U/L (ref 0–55)
AST: 23 U/L (ref 5–34)
Albumin: 3.2 g/dL — ABNORMAL LOW (ref 3.5–5.0)
Anion Gap: 10 mEq/L (ref 3–11)
BUN: 5.1 mg/dL — AB (ref 7.0–26.0)
CO2: 27 mEq/L (ref 22–29)
CREATININE: 0.7 mg/dL (ref 0.6–1.1)
Calcium: 9.5 mg/dL (ref 8.4–10.4)
Chloride: 100 mEq/L (ref 98–109)
Glucose: 120 mg/dl (ref 70–140)
POTASSIUM: 4.3 meq/L (ref 3.5–5.1)
Sodium: 137 mEq/L (ref 136–145)
Total Bilirubin: 0.77 mg/dL (ref 0.20–1.20)
Total Protein: 6.4 g/dL (ref 6.4–8.3)

## 2014-02-10 MED ORDER — CLOBETASOL PROPIONATE 0.05 % EX CREA: 1.0000 "application " | TOPICAL_CREAM | Freq: Two times a day (BID) | CUTANEOUS | Status: DC

## 2014-02-10 MED ORDER — NYSTATIN 100000 UNIT/ML MT SUSP: 5.0000 mL | Freq: Four times a day (QID) | OROMUCOSAL | Status: DC

## 2014-02-10 MED ORDER — FERROUS SULFATE 325 (65 FE) MG PO TBEC: 325.0000 mg | DELAYED_RELEASE_TABLET | Freq: Three times a day (TID) | ORAL | Status: DC

## 2014-02-10 NOTE — Telephone Encounter (Signed)
Message copied by Carlene Coria on Tue Feb 10, 2014  4:47 PM ------      Message from: Aviva Signs      Created: Mon Feb 09, 2014 11:31 AM       Pt called to cx apt today she said she didn't feel like driving after her chemo.Marland KitchenMarland KitchenShe needs something late afternoon before the 4th of August.She asked for the 28th around 2.30 but I told her Marlou Starks wasn't here that afternoon....Thayer Headings ------

## 2014-02-10 NOTE — Progress Notes (Signed)
ID: Paula Navarro OB: 1951-05-16 MR#: [id number] CSN#:[id number]  PCP: Lanette Hampshire, MD  GYN:  SU: Dr Marlou Starks  OTHER MD:  CHIEF COMPLAINT: 63 y/o female with bilateral breast cancer undergoing neoadjuvant chemotherapy  BREAST CANCER HISTORY:  Patient developed pain in her breasts and palpated masses. Because of this she went on to have further workup performed including mammograms and ultrasound. On 07/23/2013 patient had bilateral diagnostic mammogram she was noted to have bilateral breast masses on the right irregular mass lying posterior lateral breast associated with a large rounded and dense axillary nodes. The left showed slightly smaller irregular mass lying at the 11:00 to 12:00 position. There was a smaller adjacent cyst. Also noted to have a large dense axillary lymph nodes on the left. Because of this she was recommended ultrasound and biopsies. 08/07/2013 patient underwent ultrasound-guided right breast core needle biopsy as well as left biopsy. 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 The lymph node was positive for metastatic disease. On the left patient had a biopsy that also showed invasive ductal carcinoma grade 3 ER positive PR negative HER-2/neu negative with a proliferation marker Ki-67 80%. Her clinical and radiologic staging was T2 N1 on the left side T2 Nx on the right side.    CURRENT THERAPY: Neoadjuvant Taxol given weekly   INTERVAL HISTORY: Paula Navarro is here today for evaluation prior to receiving her ninth cycle of weekly neoadjuvant Taxol.  She is doing moderately well today.  She is tired, and tells me that she stays on the go for the most part.  She reports that her numbness is worse in her hands and feet, and is more constant.  She did recently fall while dipping her feet in a hot tub and has leg soreness due to that.  She did go to the Emergency Room and  underwent xrays and they were all negative.  She was prescribed Vicodin, but she is unable to take this as it makes her feel nauseated and sick.  She wants to know what else to take for her pain and soreness.  Otherwise, she denies fevers, chills, nausea, vomiting, constipation, diarrhea, skin changes or any further concerns.  REVIEW OF SYSTEMS: A 10 point review of systems was conducted and is otherwise negative except for what is noted above.   PAST MEDICAL HISTORY:  Past Medical History   Diagnosis  Date   .  Chronic bronchitis    .  Palpitation      Tachycardia reported by monitor clerk during a symptomatic spell   .  Chest pain      Admitted to [facility] in 09/2011; refused stress test   .  Atrial septal defect  1996     Surgical repair in 1996   .  Tobacco abuse      60 pack years; 1.5 packs per day   .  Anxiety    .  Anemia    .  Breast cancer    PAST SURGICAL HISTORY:  Past Surgical History   Procedure  Laterality  Date   .  Cholecystectomy     .  Cesarean section     .  Vein bypass surgery     .  Breast biopsy  Bilateral    FAMILY HISTORY  Family History   Problem  Relation  Age of Onset   .  Breast cancer  Maternal Aunt  GYNECOLOGIC HISTORY: menarche at age 27, G26, p3, no h/o hormone replacement therapy, no h/o abnormal pap smears, or sexually transmitted infections  SOCIAL HISTORY: widowed, lives with mother and son, retired, enjoys functions at [employer], No ETOH, tobacco, or illicit drug use   ADVANCED DIRECTIVES: not in place  HEALTH MAINTENANCE:  History   Substance Use Topics   .  Smoking status:  Former Smoker -- 1.50 packs/day for 40 years     Types:  Cigarettes   .  Smokeless tobacco:  Never Used   .  Alcohol Use:  No   Mammogram: 06/2013  Colonoscopy: unknown, unsure  Bone Density Scan: never  Pap Smear: 2013  Eye Exam: never  Vitamin D Level: never  Lipid Panel: never  Allergies   Allergen  Reactions   .  Codeine  Other (See Comments)     Hot in chest     Current Outpatient Prescriptions   Medication  Sig  Dispense  Refill   .  B Complex-C (SUPER B COMPLEX PO)  Take 1 tablet by mouth daily.     .  clobetasol cream (TEMOVATE) 2.58 %  Apply 1 application topically 2 (two) times daily. Apply to hands and feet twice a day as needed.  30 g  1   .  lidocaine-prilocaine (EMLA) cream  Apply 1 application topically as needed.     Marland Kitchen  LORazepam (ATIVAN) 1 MG tablet  Take 1 tablet (1 mg total) by mouth every 6 (six) hours as needed for anxiety.  30 tablet  1   .  dexamethasone (DECADRON) 4 MG tablet  Take 2 tables with a meal two times a day starting the day after chemo for two days.  30 tablet  2   .  fluconazole (DIFLUCAN) 100 MG tablet  Take 1 tablet (100 mg total) by mouth daily.  7 tablet  0   .  potassium chloride SA (K-DUR,KLOR-CON) 20 MEQ tablet  Take 1 tablet (20 mEq total) by mouth 2 (two) times daily. For 4 days  8 tablet  1   .  prochlorperazine (COMPAZINE) 10 MG tablet  TAKE 1 TABLET BY MOUTH EVERY 6 HOURS AS NEEDED FOR NAUSEA AND VOMITING  30 tablet  1    No current facility-administered medications for this visit.    Facility-Administered Medications Ordered in Other Visits   Medication  Dose  Route  Frequency  Provider  Last Rate  Last Dose   .  heparin lock flush 100 unit/mL  500 Units  Intracatheter  Once PRN  Chauncey Cruel, MD     .  PACLitaxel (TAXOL) 126 mg in dextrose 5 % 250 mL chemo infusion (</= 40m/m2)  80 mg/m2 (Treatment Plan Actual)  Intravenous  Once  GChauncey Cruel MD  271 mL/hr at 01/06/14 1119  126 mg at 01/06/14 1119   .  sodium chloride 0.9 % injection 10 mL  10 mL  Intracatheter  PRN  GChauncey Cruel MD     OBJECTIVE:  Filed Vitals:    02/10/2014   BP:  115/66  Pulse:  68  Temp:  98.3  Resp:  18  Body mass index is 24.12 kg/(m^2).  GENERAL: Patient is a well appearing female in no acute distress  HEENT: Sclerae anicteric.oropharyngeal candidiasis on tongue resolved. Neck is supple.  NODES: No  cervical, supraclavicular, or axillary lymphadenopathy palpated.  BREAST EXAM: Deferred LUNGS: Clear to auscultation bilaterally. No wheezes or rhonchi.  HEART: Regular rate and  rhythm. No murmur appreciated.  ABDOMEN: Soft, nontender. Positive, normoactive bowel sounds. No organomegaly palpated.  MSK: No focal spinal tenderness to palpation. Full range of motion bilaterally in the upper extremities.  EXTREMITIES: No peripheral edema.  SKIN: Clear with no obvious rashes or skin changes. No nail dyscrasia.  NEURO: Nonfocal. Well oriented. Appropriate affect.  ECOG FS:1 - Symptomatic but completely ambulatory  LAB RESULTS:  CBC    Component Value Date/Time   WBC 9.5 02/10/2014 0841   WBC 14.6* 09/08/2013 1300   RBC 3.50* 02/10/2014 0841   RBC 5.83* 09/08/2013 1300   HGB 8.3* 02/10/2014 0841   HGB 13.6 09/08/2013 1300   HCT 26.7* 02/10/2014 0841   HCT 41.0 09/08/2013 1300   PLT 271 02/10/2014 0841   PLT 306 09/08/2013 1300   MCV 76.3* 02/10/2014 0841   MCV 70.3* 09/08/2013 1300   MCH 23.7* 02/10/2014 0841   MCH 23.3* 09/08/2013 1300   MCHC 31.1* 02/10/2014 0841   MCHC 33.2 09/08/2013 1300   RDW 17.6* 02/10/2014 0841   RDW 14.7 09/08/2013 1300   LYMPHSABS 1.5 02/10/2014 0841   LYMPHSABS 4.3* 10/30/2011 1419   MONOABS 0.8 02/10/2014 0841   MONOABS 0.5 10/30/2011 1419   EOSABS 0.1 02/10/2014 0841   EOSABS 0.3 10/30/2011 1419   BASOSABS 0.0 02/10/2014 0841   BASOSABS 0.1 10/30/2011 1419   CMP     Component Value Date/Time   NA 137 02/10/2014 0841   NA 141 09/23/2012 0414   K 4.3 02/10/2014 0841   K 3.8 09/23/2012 0414   CL 107 09/23/2012 0414   CO2 27 02/10/2014 0841   CO2 29 10/30/2011 1419   GLUCOSE 120 02/10/2014 0841   GLUCOSE 192* 09/23/2012 0414   BUN 5.1* 02/10/2014 0841   BUN 7 09/23/2012 0414   CREATININE 0.7 02/10/2014 0841   CREATININE 0.90 09/23/2012 0414   CALCIUM 9.5 02/10/2014 0841   CALCIUM 10.1 10/30/2011 1419   PROT 6.4 02/10/2014 0841   ALBUMIN 3.2* 02/10/2014 0841   AST 23 02/10/2014 0841   ALT 26  02/10/2014 0841   ALKPHOS 56 02/10/2014 0841   BILITOT 0.77 02/10/2014 0841   GFRNONAA >90 10/30/2011 1419   GFRAA >90 10/30/2011 1419   No results found for this basename: SPEP, UPEP, kappa and lambda light chains    No results found for this basename: LABCA2    No components found with this basename: LABCA125   No results found for this basename: INR, in the last 168 hours  Urinalysis  No results found for this basename: colorurine, appearanceur, labspec, phurine, glucoseu, hgbur, bilirubinur, ketonesur, proteinur, urobilinogen, nitrite, leukocytesur   ASSESSMENT: 63 y.o. with clinical right breast T2 N1 stage II, invasive ductal carcinoma, grade III, ER positive, PR negative, Ki-67 20%, HER-2/neu negative, right breast.  1. Patient did undergo a PET CT on 08/22/2013 that demonstrated hypermetabolic breast masses, hypermetabolic lymphadenopathy, focal metabolic activity in the posterior right iliac bone, and hypermetabolism within the focus of the lower uterine segment. We will repeat an MRI of the pelvis following chmeotherapy  2. Patient was began on neoadjuvant treatment with chemotherapy consisting of Fluorouracil, Epirubicin, and Cyclophosphamide given on day 1 of a 14 day cycle with neulasta given on day 2 for granulocyte support from 09/15/13 through 11/25/13. She received 6 cycles.  3. The patient is on weekly Paclitaxel neoadjuvant chemotherapy started on 12/16/13. 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 4. Oral candidiasis - resolved. Still using nystatin  swish and swallow as needed.    PLAN:  Paula Navarro is doing moderately well today.  I discussed her increased neuropathy with Dr. Jana Hakim.  Due to this, she will not receive any further weekly Paclitaxel.  Instead, we will go ahead and get MRI of her breasts to evaluate her response to neoadjuvant chemotherapy and follow up with her afterwards.   Due to her recent fall and bruising/soreness, I recommended  she take Naprosyn if needed for the pain.  She already has this prescribed.  I prescribed an iron supplement for her to take for her microcytic anemia.   I put in a scheduling request to get Paula Navarro's MRI moved up to be done as soon as possible.  Once we get the date of her MRI, I will schedule follow up with either myself, or Dr. Jana Hakim.  I spent 25 minutes counseling the patient face to face.  The total time spent in the appointment was 30 minutes.  Minette Headland, Zeb [phone]

## 2014-02-10 NOTE — Telephone Encounter (Signed)
per pof pt needs MRI adv pt CS will call and give sch-gave pt copy of exisiting sch

## 2014-02-10 NOTE — Telephone Encounter (Signed)
Called pt to r/s appt. No answer or VM to leave msg.

## 2014-02-11 NOTE — Telephone Encounter (Signed)
Pt called back. Since she is unsure how much longer she needs pac, she will wait for MR results and discuss with oncology. Will call back to r/s once she knows the status of treatment.

## 2014-02-13 NOTE — ED Provider Notes (Signed)
Medical screening examination/treatment/procedure(s) were performed by non-physician practitioner and as supervising physician I was immediately available for consultation/collaboration.   EKG Interpretation None        Maudry Diego, MD 02/13/14 1745

## 2014-02-17 ENCOUNTER — Ambulatory Visit: Payer: Medicare Other | Admitting: Adult Health

## 2014-02-17 ENCOUNTER — Telehealth: Payer: Self-pay | Admitting: *Deleted

## 2014-02-17 ENCOUNTER — Ambulatory Visit: Payer: Medicare Other | Admitting: Physician Assistant

## 2014-02-17 ENCOUNTER — Other Ambulatory Visit: Payer: Self-pay | Admitting: *Deleted

## 2014-02-17 ENCOUNTER — Other Ambulatory Visit: Payer: Medicare Other

## 2014-02-17 ENCOUNTER — Ambulatory Visit: Payer: Medicare Other

## 2014-02-17 MED ORDER — GABAPENTIN 300 MG PO CAPS: 300.0000 mg | ORAL_CAPSULE | Freq: Three times a day (TID) | ORAL | Status: DC

## 2014-02-17 NOTE — Telephone Encounter (Signed)
EXPLAINED TO PT. THAT SHE IS TO HAVE AN MRI OF HER BREAST AT [facility] AT 3:00PM. A NURSE WILL CALL HER TODAY WITH A LAB APPOINTMENT AND FOLLOW UP VISIT WITH A PROVIDER TO DISCUSS THE MRI RESULTS. SHE VOICES UNDERSTANDING. PT. COMPLAINING OF INCREASED NUMBNESS AND TINGLING IN HER FINGERS AND TOES DUE TO HER PREVIOUS TAXOL TREATMENT WHICH HAS BEEN STOPPED. SPOKE WITH DR.MAGRINAT'S NURSE, VAL DODD,RN. SHE WILL SPEAK WITH DR.MAGRINAT AND CALL IN GABAPENTIN TO PT.'S PHARMACY, [facility], IN [place]. INSTRUCTED PT. TO TAKE MEDICATION AS PRESCRIBED AND AT HER NEXT VISIT UPDATE THE PROVIDER ON THE NUMBNESS AND TINGLING IN HER FINGERS AND TOES. PT. VOICES UNDERSTANDING.

## 2014-02-18 ENCOUNTER — Ambulatory Visit (HOSPITAL_COMMUNITY)
Admission: RE | Admit: 2014-02-18 | Discharge: 2014-02-18 | Disposition: A | Discharge status: Home / Self Care | Payer: Medicare Other | Source: Ambulatory Visit | Attending: Adult Health | Admitting: Adult Health

## 2014-02-18 DIAGNOSIS — C50919 Malignant neoplasm of unspecified site of unspecified female breast: Secondary | ICD-10-CM | POA: Insufficient documentation

## 2014-02-18 DIAGNOSIS — C773 Secondary and unspecified malignant neoplasm of axilla and upper limb lymph nodes: Secondary | ICD-10-CM | POA: Insufficient documentation

## 2014-02-18 DIAGNOSIS — C50911 Malignant neoplasm of unspecified site of right female breast: Secondary | ICD-10-CM

## 2014-02-18 DIAGNOSIS — Z9221 Personal history of antineoplastic chemotherapy: Secondary | ICD-10-CM | POA: Insufficient documentation

## 2014-02-18 DIAGNOSIS — C50912 Malignant neoplasm of unspecified site of left female breast: Secondary | ICD-10-CM

## 2014-02-18 DIAGNOSIS — I517 Cardiomegaly: Secondary | ICD-10-CM | POA: Insufficient documentation

## 2014-02-18 MED ORDER — GADOBENATE DIMEGLUMINE 529 MG/ML IV SOLN
11.0000 mL | Freq: Once | INTRAVENOUS | Status: AC | PRN
  Administered 2014-02-18: 11 mL via INTRAVENOUS

## 2014-02-19 ENCOUNTER — Ambulatory Visit: Payer: Medicare Other | Admitting: Adult Health

## 2014-02-19 ENCOUNTER — Telehealth: Payer: Self-pay | Admitting: *Deleted

## 2014-02-19 ENCOUNTER — Other Ambulatory Visit: Payer: Self-pay | Admitting: *Deleted

## 2014-02-19 ENCOUNTER — Other Ambulatory Visit: Payer: Medicare Other

## 2014-02-19 ENCOUNTER — Telehealth: Payer: Self-pay | Admitting: Adult Health

## 2014-02-19 NOTE — Telephone Encounter (Signed)
Called pt to inquire about today's appt. Pt said she was not aware of appt today @ 1:15. Pt read her schedule to me and she didn't mention today's date on schedule. I have sent a message to schedulers to make sure they call her with new appt date and time. Message to be forwarded to Charlestine Massed, NP.

## 2014-02-19 NOTE — Telephone Encounter (Signed)
cld pt and phone states VM not set up cld and left message with son Kennyth Lose of the time & date of next appt.

## 2014-02-20 ENCOUNTER — Telehealth: Payer: Self-pay | Admitting: *Deleted

## 2014-02-20 NOTE — Telephone Encounter (Signed)
Pt had called and spoken to [facility] with scheduling that she was going to come in to the cancer center today.  Called and spoke with patient that she does not have an appointment today and she has a lab and f/u appt with [name] on 02/24/14.  Pt verbalized understanding regarding date and time.  SLJ

## 2014-02-24 ENCOUNTER — Ambulatory Visit: Payer: Medicare Other

## 2014-02-24 ENCOUNTER — Ambulatory Visit (HOSPITAL_BASED_OUTPATIENT_CLINIC_OR_DEPARTMENT_OTHER): Payer: Medicare Other | Admitting: Adult Health

## 2014-02-24 ENCOUNTER — Ambulatory Visit: Payer: Medicare Other | Admitting: Adult Health

## 2014-02-24 ENCOUNTER — Telehealth: Payer: Self-pay | Admitting: Oncology

## 2014-02-24 ENCOUNTER — Other Ambulatory Visit (HOSPITAL_BASED_OUTPATIENT_CLINIC_OR_DEPARTMENT_OTHER): Payer: Medicare Other

## 2014-02-24 ENCOUNTER — Other Ambulatory Visit: Payer: Medicare Other

## 2014-02-24 ENCOUNTER — Encounter: Payer: Self-pay | Admitting: Adult Health

## 2014-02-24 VITALS — BP 128/65 | HR 83 | Temp 98.3°F | Resp 18 | Ht 62.0 in | Wt 124.2 lb

## 2014-02-24 DIAGNOSIS — C50912 Malignant neoplasm of unspecified site of left female breast: Principal | ICD-10-CM

## 2014-02-24 DIAGNOSIS — C50911 Malignant neoplasm of unspecified site of right female breast: Secondary | ICD-10-CM

## 2014-02-24 DIAGNOSIS — C50919 Malignant neoplasm of unspecified site of unspecified female breast: Secondary | ICD-10-CM

## 2014-02-24 DIAGNOSIS — C773 Secondary and unspecified malignant neoplasm of axilla and upper limb lymph nodes: Secondary | ICD-10-CM

## 2014-02-24 DIAGNOSIS — Z170 Estrogen receptor positive status [ER+]: Secondary | ICD-10-CM

## 2014-02-24 DIAGNOSIS — F17200 Nicotine dependence, unspecified, uncomplicated: Secondary | ICD-10-CM

## 2014-02-24 DIAGNOSIS — Z720 Tobacco use: Secondary | ICD-10-CM

## 2014-02-24 LAB — COMPREHENSIVE METABOLIC PANEL (CC13)
ALBUMIN: 3 g/dL — AB (ref 3.5–5.0)
ALK PHOS: 59 U/L (ref 40–150)
ALT: 29 U/L (ref 0–55)
AST: 30 U/L (ref 5–34)
Anion Gap: 10 mEq/L (ref 3–11)
BILIRUBIN TOTAL: 0.32 mg/dL (ref 0.20–1.20)
BUN: 5.1 mg/dL — AB (ref 7.0–26.0)
CO2: 26 mEq/L (ref 22–29)
CREATININE: 0.8 mg/dL (ref 0.6–1.1)
Calcium: 9.3 mg/dL (ref 8.4–10.4)
Chloride: 107 mEq/L (ref 98–109)
GLUCOSE: 156 mg/dL — AB (ref 70–140)
POTASSIUM: 4.3 meq/L (ref 3.5–5.1)
Sodium: 142 mEq/L (ref 136–145)
Total Protein: 6.3 g/dL — ABNORMAL LOW (ref 6.4–8.3)

## 2014-02-24 LAB — CBC WITH DIFFERENTIAL/PLATELET
BASO%: 0.1 % (ref 0.0–2.0)
BASOS ABS: 0 10*3/uL (ref 0.0–0.1)
EOS ABS: 0.3 10*3/uL (ref 0.0–0.5)
EOS%: 3.6 % (ref 0.0–7.0)
HEMATOCRIT: 29.3 % — AB (ref 34.8–46.6)
HEMOGLOBIN: 9 g/dL — AB (ref 11.6–15.9)
LYMPH%: 22.3 % (ref 14.0–49.7)
MCH: 23.3 pg — ABNORMAL LOW (ref 25.1–34.0)
MCHC: 30.6 g/dL — ABNORMAL LOW (ref 31.5–36.0)
MCV: 76.1 fL — ABNORMAL LOW (ref 79.5–101.0)
MONO#: 0.7 10*3/uL (ref 0.1–0.9)
MONO%: 8.8 % (ref 0.0–14.0)
NEUT%: 65.2 % (ref 38.4–76.8)
NEUTROS ABS: 5.2 10*3/uL (ref 1.5–6.5)
PLATELETS: 490 10*3/uL — AB (ref 145–400)
RBC: 3.85 10*6/uL (ref 3.70–5.45)
RDW: 18.7 % — ABNORMAL HIGH (ref 11.2–14.5)
WBC: 8.1 10*3/uL (ref 3.9–10.3)
lymph#: 1.8 10*3/uL (ref 0.9–3.3)

## 2014-02-24 NOTE — Telephone Encounter (Signed)
per pof to sch pt appt-sch-cld Dr Marlou Starks to make a appt for pt-pt stated will call herself-gave pt copy of sch

## 2014-02-24 NOTE — Progress Notes (Signed)
ID: Paula Navarro OB: 10-24-1950 MR#: [id number] CSN#:[id number]  PCP: Lanette Hampshire, MD  GYN:  SU: Dr Marlou Starks  OTHER MD:  CHIEF COMPLAINT: 63 y/o female with bilateral breast cancer undergoing neoadjuvant chemotherapy  BREAST CANCER HISTORY:  Patient developed pain in her breasts and palpated masses. Because of this she went on to have further workup performed including mammograms and ultrasound. On 07/23/2013 patient had bilateral diagnostic mammogram she was noted to have bilateral breast masses on the right irregular mass lying posterior lateral breast associated with a large rounded and dense axillary nodes. The left showed slightly smaller irregular mass lying at the 11:00 to 12:00 position. There was a smaller adjacent cyst. Also noted to have a large dense axillary lymph nodes on the left. Because of this she was recommended ultrasound and biopsies. 08/07/2013 patient underwent ultrasound-guided right breast core needle biopsy as well as left biopsy. 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 The lymph node was positive for metastatic disease. On the left patient had a biopsy that also showed invasive ductal carcinoma grade 3 ER positive PR negative HER-2/neu negative with a proliferation marker Ki-67 80%. Her clinical and radiologic staging was T2 N1 on the left side T2 Nx on the right side.    CURRENT THERAPY: S/p weekly Taxol  INTERVAL HISTORY:  Paula Navarro is here for followup to discuss the MRI of her breast results.  She is doing well today.  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  She also tells me that she is not going to have surgery unless she can have a lumpectomy.  Otherwise, she denies fevers, chills, nausea, vomiting, constipation, diarrhea, headaches, new pain, or any further concerns.    REVIEW OF SYSTEMS: A 10 point  review of systems was conducted and is otherwise negative except for what is noted above.   PAST MEDICAL HISTORY:  Past Medical History   Diagnosis  Date   .  Chronic bronchitis    .  Palpitation      Tachycardia reported by monitor clerk during a symptomatic spell   .  Chest pain      Admitted to [facility] in 09/2011; refused stress test   .  Atrial septal defect  1996     Surgical repair in 1996   .  Tobacco abuse      60 pack years; 1.5 packs per day   .  Anxiety    .  Anemia    .  Breast cancer    PAST SURGICAL HISTORY:  Past Surgical History   Procedure  Laterality  Date   .  Cholecystectomy     .  Cesarean section     .  Vein bypass surgery     .  Breast biopsy  Bilateral    FAMILY HISTORY  Family History   Problem  Relation  Age of Onset   .  Breast cancer  Maternal Aunt    GYNECOLOGIC HISTORY: menarche at age 39, G88, p11, no h/o hormone replacement therapy, no h/o abnormal pap smears, or sexually transmitted infections  SOCIAL HISTORY: widowed, lives with mother and son, retired, enjoys functions at [employer], No ETOH, tobacco, or illicit drug use   ADVANCED DIRECTIVES: not in place  HEALTH MAINTENANCE:  History   Substance Use Topics   .  Smoking status:  Former Smoker -- 1.50  packs/day for 40 years     Types:  Cigarettes   .  Smokeless tobacco:  Never Used   .  Alcohol Use:  No   Mammogram: 06/2013  Colonoscopy: unknown, unsure  Bone Density Scan: never  Pap Smear: 2013  Eye Exam: never  Vitamin D Level: never  Lipid Panel: never  Allergies   Allergen  Reactions   .  Codeine  Other (See Comments)     Hot in chest    Current Outpatient Prescriptions   Medication  Sig  Dispense  Refill   .  B Complex-C (SUPER B COMPLEX PO)  Take 1 tablet by mouth daily.     .  clobetasol cream (TEMOVATE) 1.42 %  Apply 1 application topically 2 (two) times daily. Apply to hands and feet twice a day as needed.  30 g  1   .  lidocaine-prilocaine (EMLA) cream  Apply 1 application  topically as needed.     Marland Kitchen  LORazepam (ATIVAN) 1 MG tablet  Take 1 tablet (1 mg total) by mouth every 6 (six) hours as needed for anxiety.  30 tablet  1   .  dexamethasone (DECADRON) 4 MG tablet  Take 2 tables with a meal two times a day starting the day after chemo for two days.  30 tablet  2   .  fluconazole (DIFLUCAN) 100 MG tablet  Take 1 tablet (100 mg total) by mouth daily.  7 tablet  0   .  potassium chloride SA (K-DUR,KLOR-CON) 20 MEQ tablet  Take 1 tablet (20 mEq total) by mouth 2 (two) times daily. For 4 days  8 tablet  1   .  prochlorperazine (COMPAZINE) 10 MG tablet  TAKE 1 TABLET BY MOUTH EVERY 6 HOURS AS NEEDED FOR NAUSEA AND VOMITING  30 tablet  1    No current facility-administered medications for this visit.    Facility-Administered Medications Ordered in Other Visits   Medication  Dose  Route  Frequency  Provider  Last Rate  Last Dose   .  heparin lock flush 100 unit/mL  500 Units  Intracatheter  Once PRN  Chauncey Cruel, MD     .  PACLitaxel (TAXOL) 126 mg in dextrose 5 % 250 mL chemo infusion (</= 41m/m2)  80 mg/m2 (Treatment Plan Actual)  Intravenous  Once  GChauncey Cruel MD  271 mL/hr at 01/06/14 1119  126 mg at 01/06/14 1119   .  sodium chloride 0.9 % injection 10 mL  10 mL  Intracatheter  PRN  GChauncey Cruel MD     OBJECTIVE:  Filed Vitals:    02/26/2014   BP:  115/66  Pulse:  68  Temp:  98.3  Resp:  18  Body mass index is 24.12 kg/(m^2).  GENERAL: Patient is a well appearing female in no acute distress  HEENT: Sclerae anicteric.oropharyngeal candidiasis on tongue resolved. Neck is supple.  NODES: No cervical, supraclavicular, or axillary lymphadenopathy palpated.  BREAST EXAM: Deferred LUNGS: Clear to auscultation bilaterally. No wheezes or rhonchi.  HEART: Regular rate and rhythm. No murmur appreciated.  ABDOMEN: Soft, nontender. Positive, normoactive bowel sounds. No organomegaly palpated.  MSK: No focal spinal tenderness to palpation. Full range  of motion bilaterally in the upper extremities.  EXTREMITIES: No peripheral edema.  SKIN: Clear with no obvious rashes or skin changes. No nail dyscrasia.  NEURO: Nonfocal. Well oriented. Appropriate affect.  ECOG FS:1 - Symptomatic but completely ambulatory  LAB RESULTS:  CBC  Component Value Date/Time   WBC 8.1 02/24/2014 0904   WBC 14.6* 09/08/2013 1300   RBC 3.85 02/24/2014 0904   RBC 5.83* 09/08/2013 1300   HGB 9.0* 02/24/2014 0904   HGB 13.6 09/08/2013 1300   HCT 29.3* 02/24/2014 0904   HCT 41.0 09/08/2013 1300   PLT 490* 02/24/2014 0904   PLT 306 09/08/2013 1300   MCV 76.1* 02/24/2014 0904   MCV 70.3* 09/08/2013 1300   MCH 23.3* 02/24/2014 0904   MCH 23.3* 09/08/2013 1300   MCHC 30.6* 02/24/2014 0904   MCHC 33.2 09/08/2013 1300   RDW 18.7* 02/24/2014 0904   RDW 14.7 09/08/2013 1300   LYMPHSABS 1.8 02/24/2014 0904   LYMPHSABS 4.3* 10/30/2011 1419   MONOABS 0.7 02/24/2014 0904   MONOABS 0.5 10/30/2011 1419   EOSABS 0.3 02/24/2014 0904   EOSABS 0.3 10/30/2011 1419   BASOSABS 0.0 02/24/2014 0904   BASOSABS 0.1 10/30/2011 1419   CMP     Component Value Date/Time   NA 142 02/24/2014 0904   NA 141 09/23/2012 0414   K 4.3 02/24/2014 0904   K 3.8 09/23/2012 0414   CL 107 09/23/2012 0414   CO2 26 02/24/2014 0904   CO2 29 10/30/2011 1419   GLUCOSE 156* 02/24/2014 0904   GLUCOSE 192* 09/23/2012 0414   BUN 5.1* 02/24/2014 0904   BUN 7 09/23/2012 0414   CREATININE 0.8 02/24/2014 0904   CREATININE 0.90 09/23/2012 0414   CALCIUM 9.3 02/24/2014 0904   CALCIUM 10.1 10/30/2011 1419   PROT 6.3* 02/24/2014 0904   ALBUMIN 3.0* 02/24/2014 0904   AST 30 02/24/2014 0904   ALT 29 02/24/2014 0904   ALKPHOS 59 02/24/2014 0904   BILITOT 0.32 02/24/2014 0904   GFRNONAA >90 10/30/2011 1419   GFRAA >90 10/30/2011 1419   No results found for this basename: SPEP, UPEP, kappa and lambda light chains    No results found for this basename: LABCA2    No components found with this basename: LABCA125   No results found for this basename: INR,  in the last 168 hours  Urinalysis  No results found for this basename: colorurine, appearanceur, labspec, phurine, glucoseu, hgbur, bilirubinur, ketonesur, proteinur, urobilinogen, nitrite, leukocytesur   STUDIES:  CLINICAL DATA: Patient with history of bilateral breast cancer and  axillary lymph node metastasis. Patient on neoadjuvant chemotherapy.  LABS: GFR greater than 60  EXAM:  BILATERAL BREAST MRI WITH AND WITHOUT CONTRAST  TECHNIQUE:  Multiplanar, multisequence MR images of both breasts were obtained  prior to and following the intravenous administration of 81m of  MultiHance.  THREE-DIMENSIONAL MR IMAGE RENDERING ON INDEPENDENT WORKSTATION:  Three-dimensional MR images were rendered by post-processing of the  original MR data on an independent workstation. The  three-dimensional MR images were interpreted, and findings are  reported in the following complete MRI report for this study. Three  dimensional images were evaluated at the independent DynaCad  workstation  COMPARISON: Breast MRI 12/24/2013 and 08/23/2013  FINDINGS:  Breast composition: c: Heterogeneous fibroglandular tissue  Background parenchymal enhancement: Mild  Right breast: Slight interval decrease in size and enhancement of  irregular enhancing mass within the superior aspect of the right  breast posterior depth measuring 1.9 cm AP, 0.4 cm transverse, 0.8  cm craniocaudal, previously 1.9 x 1.1 x 1.1 cm.  Left breast: Slight interval decrease in size and enhancement of  dominant enhancing mass within the superior aspect of the left  breast middle depth measuring 2.5 cm AP x 2.0 cm transverse  x 1.7 cm  CC, previously 2.6 x 2.2 x 2.3 cm. Previously described areas of non  mass enhancement small and enhancing nodularity have resolved when  compared to prior examinations.  Lymph nodes: No residual enlarged axillary lymph nodes.  Ancillary findings: Right chest wall Port-A-Cath. Artifact from  median  sternotomy. Cardiomegaly.  IMPRESSION:  Interval decrease in enhancement and size of bilateral breast  masses. Continued interval decrease in size of bilateral axillary  lymph nodes. No residual enlarged lymphadenopathy is demonstrated.  RECOMMENDATION:  Treatment plan.  BI-RADS CATEGORY 6: Known biopsy-proven malignancy.  Electronically Signed  By: Lovey Newcomer M.D.  On: 02/19/2014 14:49 ASSESSMENT: 63 y.o. with clinical right breast T2 N1 stage II, invasive ductal carcinoma, grade III, ER positive, PR negative, Ki-67 20%, HER-2/neu negative, right breast.  1. Patient did undergo a PET CT on 08/22/2013 that demonstrated hypermetabolic breast masses, hypermetabolic lymphadenopathy, focal metabolic activity in the posterior right iliac bone, and hypermetabolism within the focus of the lower uterine segment. We will repeat an MRI of the pelvis following chmeotherapy  2. Patient was began on neoadjuvant treatment with chemotherapy consisting of Fluorouracil, Epirubicin, and Cyclophosphamide given on day 1 of a 14 day cycle with neulasta given on day 2 for granulocyte support from 09/15/13 through 11/25/13. She received 6 cycles.  3. The patient is on weekly Paclitaxel neoadjuvant chemotherapy started on 12/16/13. This was discontinued on 02/03/2014, after she received 8 cycles due to progressive neuropathy. 4. Oral candidiasis - resolved. Still using nystatin swish and swallow as needed.    PLAN:  Paula Navarro is doing well today.  I reviewed her MRI of the breast results with her.  There was an interval decrease noted.  I reviewed her case with Dr. Jana Hakim.  She will return to see the surgeon, Dr. Marlou Starks to discuss her surgical options.  She informed me multiple times that she would not have a mastectomy, she would just live with the cancer.  I encouraged her to just wait and see what he recommended.  She last received chemotherapy on 02/03/2014.    I requested urgent f/u with Dr. Marlou Starks.  This was scheduled for  03/10/14.  I did request Bary Castilla, our RN navigator to please see if we could get this scheduled next week.  She will f/u with Dr. Lindi Adie in one month.   I spent 25 minutes counseling the patient face to face.  The total time spent in the appointment was 30 minutes.  Minette Headland, [place] [phone]

## 2014-02-27 ENCOUNTER — Ambulatory Visit (HOSPITAL_COMMUNITY): Payer: Medicare Other

## 2014-03-02 ENCOUNTER — Other Ambulatory Visit: Payer: Self-pay | Admitting: *Deleted

## 2014-03-02 ENCOUNTER — Telehealth: Payer: Self-pay | Admitting: *Deleted

## 2014-03-02 ENCOUNTER — Telehealth: Payer: Self-pay | Admitting: Hematology and Oncology

## 2014-03-02 NOTE — Telephone Encounter (Signed)
Called to f/u with pt to see has numbness and pain to hands and feet any better. Pt states, " its no better, but taking the Motrin and alternating the Hydrocodone". Pt says the pain is worse at night when she is trying to rest therefore causing here to lose sleep. Offered pt  an appt to be seen on tomorrow with Selena Lesser, NP. Pt has been called and will see NP @ 9:15a on 03/03/2014. Pt has agreed to stay off feet today and keep elevated.Message to be forwarded to Charlestine Massed, NP.

## 2014-03-02 NOTE — Telephone Encounter (Signed)
   Provider input needed: Neuropathy, swelling, pain   Reason for call: "Unbearaable pain with numbness to my hands and feet with swelling to feet.  My feet feel like they are going to burst.  The taxol was stopped because of numbness and it has gotten worse.    Cardiovascular: positive for lower extremity edema Musculoskeletal:positive for Pain to hands and feet Neurological: positive for "numbness, tingling and trouble walking and using hands."   ALLERGIES:  is allergic to codeine.  Patient last received chemotherapy/ treatment on 02-03-2014 taxol  Patient was last seen in the office on 02-24-2014   Next appt is 03-27-2014  Is patient having fevers greater than 100.5?  no   Is patient having uncontrolled pain, or new pain? no, "It started with the taxol, It started getting worse when I started the iron pills, I just don't tell it all but it has gotten worse."   Is patient having new back pain that changes with position (worsens or eases when laying down?)  no   Is patient able to eat and drink? yes    Is patient able to pass stool without difficulty?   yes     Is patient having uncontrolled nausea?  no    patient calls 03/02/2014 with complaint of  Cardiovascular: positive for lower extremity edema Musculoskeletal:positive for pain to hands and feet Neurological: positive for numbness, tingling, it feels like I'm walking on air and I'm having trouble buttoning things and dropping everything.  I am taking two advil every four hours but have to be careful because advil causes palpitations for me if I take too many.  The on call doctor told me to call to see [name] and get something stronger for pain.  I have to see someone to get a narcotic"   Summary Based on the above information advised patient to  Await return call as this nurse notifies providers of this symptom call.  Can reach her at [phone].   Paula Navarro, Paula Navarro  03/02/2014, 10:27 AM   Background Info  [name]   DOB: June 30, 1951   MR#: [id number]   CSN#   [id number] 03/02/2014

## 2014-03-02 NOTE — Telephone Encounter (Signed)
s/w pt re appt for 8/4 w/CB.

## 2014-03-02 NOTE — Telephone Encounter (Signed)
Appointment scheduled for 9:30 tomorrow am.

## 2014-03-02 NOTE — Progress Notes (Signed)
Charlestine Massed NP received information from weekend on-call provider and this nurse about this patient.  Learned patient would like to be seen tomorrow morning.  Having trouble holding things, pain is worse at night, she can't sleep.  Alternating hydrocodone with advil per collaborative staff.  P.O.F. Generated.

## 2014-03-03 ENCOUNTER — Other Ambulatory Visit: Payer: Self-pay | Admitting: Hematology and Oncology

## 2014-03-03 ENCOUNTER — Ambulatory Visit (HOSPITAL_BASED_OUTPATIENT_CLINIC_OR_DEPARTMENT_OTHER): Payer: Medicare Other | Admitting: Nurse Practitioner

## 2014-03-03 ENCOUNTER — Encounter: Payer: Self-pay | Admitting: Nurse Practitioner

## 2014-03-03 ENCOUNTER — Telehealth: Payer: Self-pay | Admitting: Hematology and Oncology

## 2014-03-03 ENCOUNTER — Ambulatory Visit: Payer: Medicare Other | Admitting: Adult Health

## 2014-03-03 ENCOUNTER — Ambulatory Visit: Payer: Medicare Other

## 2014-03-03 ENCOUNTER — Other Ambulatory Visit: Payer: Medicare Other

## 2014-03-03 VITALS — BP 126/71 | HR 74 | Temp 98.3°F | Resp 20 | Wt 122.7 lb

## 2014-03-03 DIAGNOSIS — C50912 Malignant neoplasm of unspecified site of left female breast: Secondary | ICD-10-CM

## 2014-03-03 DIAGNOSIS — F419 Anxiety disorder, unspecified: Secondary | ICD-10-CM | POA: Insufficient documentation

## 2014-03-03 DIAGNOSIS — C50919 Malignant neoplasm of unspecified site of unspecified female breast: Secondary | ICD-10-CM

## 2014-03-03 DIAGNOSIS — L271 Localized skin eruption due to drugs and medicaments taken internally: Secondary | ICD-10-CM | POA: Insufficient documentation

## 2014-03-03 DIAGNOSIS — L270 Generalized skin eruption due to drugs and medicaments taken internally: Secondary | ICD-10-CM

## 2014-03-03 DIAGNOSIS — C50911 Malignant neoplasm of unspecified site of right female breast: Secondary | ICD-10-CM

## 2014-03-03 DIAGNOSIS — G620 Drug-induced polyneuropathy: Secondary | ICD-10-CM | POA: Insufficient documentation

## 2014-03-03 DIAGNOSIS — F411 Generalized anxiety disorder: Secondary | ICD-10-CM

## 2014-03-03 DIAGNOSIS — C773 Secondary and unspecified malignant neoplasm of axilla and upper limb lymph nodes: Secondary | ICD-10-CM

## 2014-03-03 DIAGNOSIS — Z720 Tobacco use: Secondary | ICD-10-CM

## 2014-03-03 DIAGNOSIS — T451X5A Adverse effect of antineoplastic and immunosuppressive drugs, initial encounter: Principal | ICD-10-CM

## 2014-03-03 MED ORDER — OXYCODONE-ACETAMINOPHEN 5-325 MG PO TABS: 1.0000 | ORAL_TABLET | Freq: Four times a day (QID) | ORAL | Status: DC | PRN

## 2014-03-03 NOTE — Assessment & Plan Note (Signed)
Patient is status post paclitaxel chemotherapy last received on 02/03/2014 due to worsening issues with neuropathy to all of her extremities.  Patient states that she has tried gabapentin and hydrocodone with minimal effect.  Patient was given educational information today regarding neuropathy.  Patient requested; and was given a prescription for oxycodone to try for her nerve pain.  He carefully review of patient's stated allergy to codeine in the past.  Patient confirmed to have a mild reaction approximate 30 years ago to codeine; it had no reaction whatsoever when taking that either hydrocodone or oxycodone in the past.

## 2014-03-03 NOTE — Progress Notes (Signed)
[place]   Chief Complaint  Patient presents with  . Peripheral Neuropathy    HPI: Paula Navarro 63 y.o. female diagnosed with bilateral breast cancer.  Patient is status post neoadjuvant paclitaxel; last received on 02/03/2014 after 8 cycles.  Chemotherapy was discontinued due to worsening issues with neuropathy.  Patient called the cancer Center today requesting an urgent care visit.  She is complaining of continued, chronic neuropathy to all of her extremities.  She has tried gabapentin and hydrocodone with minimal affect.  Also, she does have some mild hand/foot syndrome as well.  Patient states she's been taking lorazepam on an as-needed basis to help with her neuropathy.   HPI  CURRENT THERAPY: No active treatment plan for patient.   Review of Systems  Constitutional: Negative.  Negative for fever and chills.  HENT: Negative.   Eyes: Negative.   Respiratory: Negative.   Cardiovascular: Negative.   Gastrointestinal: Negative.   Genitourinary: Negative.   Musculoskeletal: Negative.   Skin: Negative for itching and rash.       Hand/foot syndrome   Neurological:       Worsening neuropathy to all extremities.  Endo/Heme/Allergies: Negative.   Psychiatric/Behavioral: Negative.   All other systems reviewed and are negative.   Past Medical History  Diagnosis Date  . Chronic bronchitis   . Palpitation     Tachycardia reported by monitor clerk during a symptomatic spell  . Chest pain     Admitted to [facility] in 09/2011; refused stress test  . Atrial septal defect 1996    Surgical repair in 1996  . Tobacco abuse     60 pack years; 1.5 packs per day  . Anxiety   . Anemia   . Breast cancer     Past Surgical History  Procedure Laterality Date  . Cholecystectomy    . Cesarean section    . Vein bypass surgery    . Breast biopsy Bilateral     has Chronic bronchitis; Tobacco abuse; Laboratory test; Palpitation; Chest pain; Atrial septal defect; Bilateral  breast cancer; Neuropathy due to chemotherapeutic drug; Hand foot syndrome; and Anxiety on her problem list.     is allergic to codeine.    Medication List       This list is accurate as of: 03/03/14  1:06 PM.  Always use your most recent med list.               clobetasol cream 0.05 %  Commonly known as:  TEMOVATE  Apply 1 application topically 2 (two) times daily. Apply to hands and feet twice a day as needed.     ferrous sulfate 325 (65 FE) MG EC tablet  Take 1 tablet (325 mg total) by mouth 3 (three) times daily with meals.     lidocaine-prilocaine cream  Commonly known as:  EMLA  Apply 1 application topically as needed.     LORazepam 1 MG tablet  Commonly known as:  ATIVAN  Take 1 tablet (1 mg total) by mouth every 6 (six) hours as needed for anxiety.     naproxen 375 MG tablet  Commonly known as:  NAPROSYN  Take 1 tablet (375 mg total) by mouth 2 (two) times daily.     nystatin 100000 UNIT/ML suspension  Commonly known as:  MYCOSTATIN  Take 5 mLs (500,000 Units total) by mouth 4 (four) times daily. X 1 week.     oxyCODONE-acetaminophen 5-325 MG per tablet  Commonly known as:  PERCOCET/ROXICET  Take 1 tablet by mouth every 6 (six) hours as needed for severe pain.     prochlorperazine 10 MG tablet  Commonly known as:  COMPAZINE  Take 1 tablet (10 mg total) by mouth every 6 (six) hours as needed for nausea or vomiting.     SUPER B COMPLEX PO  Take 1 tablet by mouth daily.         PHYSICAL EXAMINATION  Blood pressure 126/71, pulse 74, temperature 98.3 F (36.8 C), resp. rate 20, weight 122 lb 11.2 oz (55.656 kg), last menstrual period 05/20/2011.  Physical Exam  Nursing note and vitals reviewed. Constitutional: She is oriented to person, place, and time and well-developed, well-nourished, and in no distress.  HENT:  Head: Normocephalic and atraumatic.  Eyes: Conjunctivae are normal. Pupils are equal, round, and reactive to light.  Neck: Normal range of  motion. Neck supple.  Cardiovascular: Normal rate, regular rhythm and normal heart sounds.  Exam reveals no friction rub.   No murmur heard. Pulmonary/Chest: Effort normal and breath sounds normal.  Abdominal: Soft. Bowel sounds are normal.  Musculoskeletal: Normal range of motion. She exhibits no edema and no tenderness.  Lymphadenopathy:    She has no cervical adenopathy.  Neurological: She is alert and oriented to person, place, and time. Gait normal.  Skin: Skin is warm and dry. There is erythema.  Bilateral palms of hands with some very mild erythema; skin does appear very dry.  Also noted to have some nail changes to both fingernails and toenails.  Psychiatric: Affect normal.   ASSESSMENT/PLAN:    Tobacco abuse  Assessment & Plan Patient  has a long-term, chronic history of tobacco abuse.  Once again reviewed need to decrease or stop smoking altogether.  Advised patient that smoking could continually aggravate her already painful neuropathy.     Bilateral breast cancer  Assessment & Plan The patient last received paclitaxel chemotherapy on 02/03/2014.  Chemotherapy was discontinued after 8 cycles do to worsening issues with neuropathy.  Patient does have a plan to followup with Dr. Marlou Starks general surgery on 03/10/2014 for evaluation of possible surgery.   Neuropathy due to chemotherapeutic drug  Assessment & Plan Patient is status post paclitaxel chemotherapy last received on 02/03/2014 due to worsening issues with neuropathy to all of her extremities.  Patient states that she has tried gabapentin and hydrocodone with minimal effect.  Patient was given educational information today regarding neuropathy.  Patient requested; and was given a prescription for oxycodone to try for her nerve pain.  He carefully review of patient's stated allergy to codeine in the past.  Patient confirmed to have a mild reaction approximate 30 years ago to codeine; it had no reaction whatsoever when taking  that either hydrocodone or oxycodone in the past.   Hand foot syndrome  Assessment & Plan Patient does have some mild, resolving hand/foot syndrome.  She does have some discoloration of her fingernails and toenails.  Dear he mild dryness and cracking to bilateral palms.  Patient was advised to keep her hand he well moisturized; and protected from extremes in temperature.   Anxiety  Assessment & Plan Patient does admit to a long-term history of anxiety; and states that she has been taking lorazepam on an as-needed basis for several years prior to her cancer diagnosis.  She she started taking the lorazepam again in hopes it would help with her neuropathy.  Advised patient that lorazepam has minimal to on her neuropathy. Patient stated that she would be willing  to try the oxycodone; instead of a lorazepam.    Patient stated understanding of all instructions; and was in agreement with this plan of care. The patient knows to call the clinic with any problems, questions or concerns.   Will review all with Dr. Marko Plume regarding patient's visit today.   Total time spent with patient was 40 minutes;  with greater than 75 percent of that time spent in face to face counseling regarding review of her symptoms, review of educational material that was printed out, and coordination of care and follow up.  Disclaimer: This note was dictated with voice recognition software. Similar sounding words can inadvertently be transcribed and may not be corrected upon review.   Drue Second, NP 03/03/2014

## 2014-03-03 NOTE — Assessment & Plan Note (Signed)
The patient last received paclitaxel chemotherapy on 02/03/2014.  Chemotherapy was discontinued after 8 cycles do to worsening issues with neuropathy.  Patient does have a plan to followup with Dr. Marlou Starks general surgery on 03/10/2014 for evaluation of possible surgery.

## 2014-03-03 NOTE — Assessment & Plan Note (Signed)
Patient  has a long-term, chronic history of tobacco abuse.  Once again reviewed need to decrease or stop smoking altogether.  Advised patient that smoking could continually aggravate her already painful neuropathy.

## 2014-03-03 NOTE — Assessment & Plan Note (Signed)
Patient does admit to a long-term history of anxiety; and states that she has been taking lorazepam on an as-needed basis for several years prior to her cancer diagnosis.  She she started taking the lorazepam again in hopes it would help with her neuropathy.  Advised patient that lorazepam has minimal to on her neuropathy. Patient stated that she would be willing to try the oxycodone; instead of a lorazepam.

## 2014-03-03 NOTE — Assessment & Plan Note (Signed)
Patient does have some mild, resolving hand/foot syndrome.  She does have some discoloration of her fingernails and toenails.  Dear he mild dryness and cracking to bilateral palms.  Patient was advised to keep her hand he well moisturized; and protected from extremes in temperature.

## 2014-03-03 NOTE — Telephone Encounter (Signed)
pt seen by CB today. already on schedule for next f/u for 8/28 per 8/4 pof.

## 2014-03-04 ENCOUNTER — Telehealth: Payer: Self-pay | Admitting: *Deleted

## 2014-03-04 NOTE — Progress Notes (Signed)
Unable to reach her today/slc

## 2014-03-04 NOTE — Telephone Encounter (Signed)
Called pt back concerning the nerve pain she's been having in hands and feet. Pt saw Selena Lesser, NP on yesterday was Rx Oxycodone for pain. Pt states, "the pain medication is not working" and she would like to try the Lyrica that was suggested to her from Selena Lesser, NP. I will f/u on this and call pt back. Message to be forwarded to Selena Lesser, NP., and Charlestine Massed, NP.

## 2014-03-04 NOTE — Telephone Encounter (Signed)
Attempted to call patient to f/u on neuropathy.

## 2014-03-04 NOTE — Telephone Encounter (Signed)
Called back to report "that Percocet ain't doing nothing for me". The tingling, numbness, burning is "driving me crazy and I can't sleep at night". Says that the NP mentioned something about trying Lyrica. She has been off the gabapentin for a couple weeks now. Made Lilit aware that sometimes the tingling/numbness does not always completely stop, can just control the discomfort so she can tolerate it. Expresses frustration of not seeing a physician since March 2015-all visits have been with NP.

## 2014-03-05 ENCOUNTER — Other Ambulatory Visit: Payer: Self-pay

## 2014-03-05 DIAGNOSIS — C50912 Malignant neoplasm of unspecified site of left female breast: Principal | ICD-10-CM

## 2014-03-05 DIAGNOSIS — C50911 Malignant neoplasm of unspecified site of right female breast: Secondary | ICD-10-CM

## 2014-03-05 MED ORDER — PREGABALIN 50 MG PO CAPS: 50.0000 mg | ORAL_CAPSULE | Freq: Two times a day (BID) | ORAL | Status: DC | PRN

## 2014-03-05 NOTE — Progress Notes (Signed)
Pt called in to [facility]/LPN and to triage regarding neuropathy.  Per pt previous discussion with Retta Mac, pt states percocet not working and she would like to try the Lyrica.  Per Retta Mac - ok to order 50mg  BID 7 day supply.  Prescription printed and placed in book for pt son to pick up this afternoon.  Let pt know it would be more sedating and that she should not take it with other sedating medications such as Ativan or Percocet.  Pt voiced understanding.

## 2014-03-10 ENCOUNTER — Telehealth (INDEPENDENT_AMBULATORY_CARE_PROVIDER_SITE_OTHER): Payer: Self-pay

## 2014-03-10 ENCOUNTER — Ambulatory Visit (INDEPENDENT_AMBULATORY_CARE_PROVIDER_SITE_OTHER): Payer: Medicare Other | Admitting: General Surgery

## 2014-03-10 ENCOUNTER — Other Ambulatory Visit: Payer: Self-pay | Admitting: *Deleted

## 2014-03-10 ENCOUNTER — Encounter (INDEPENDENT_AMBULATORY_CARE_PROVIDER_SITE_OTHER): Payer: Self-pay | Admitting: General Surgery

## 2014-03-10 ENCOUNTER — Ambulatory Visit: Payer: Medicare Other | Admitting: Adult Health

## 2014-03-10 ENCOUNTER — Telehealth: Payer: Self-pay | Admitting: *Deleted

## 2014-03-10 ENCOUNTER — Other Ambulatory Visit: Payer: Medicare Other

## 2014-03-10 VITALS — BP 132/72 | HR 87 | Temp 98.3°F | Ht 62.0 in | Wt 121.0 lb

## 2014-03-10 DIAGNOSIS — C50911 Malignant neoplasm of unspecified site of right female breast: Secondary | ICD-10-CM

## 2014-03-10 DIAGNOSIS — C50912 Malignant neoplasm of unspecified site of left female breast: Principal | ICD-10-CM

## 2014-03-10 DIAGNOSIS — C50919 Malignant neoplasm of unspecified site of unspecified female breast: Secondary | ICD-10-CM

## 2014-03-10 MED ORDER — LORAZEPAM 1 MG PO TABS: 1.0000 mg | ORAL_TABLET | Freq: Four times a day (QID) | ORAL | Status: DC | PRN

## 2014-03-10 NOTE — Patient Instructions (Signed)
Plan for bilateral lumpectomy and sentinel node mapping if on study

## 2014-03-10 NOTE — Telephone Encounter (Signed)
VERBAL ORDER AND READ BACK TO LINDSEY CORNETTO,NP- MAY TAKE ATIVAN 1MG  AT BEDTIME. NOTIFIED PT. SHE VOICES UNDERSTANDING.

## 2014-03-10 NOTE — Telephone Encounter (Signed)
Called pt back and let her know Dr Marlou Starks does need to see her even if she does not have copay. She can still make her appt today. Appt rescheduled for 10:50 this morning.

## 2014-03-10 NOTE — Telephone Encounter (Signed)
Message copied by Carlene Coria on Tue Mar 10, 2014  9:42 AM ------      Message from: Riley Kill      Created: Tue Mar 10, 2014  9:33 AM      Regarding: Appt cancellation      Contact: [phone]       Sharyn Lull:        Just a follow up to our conversation.  Pt states she was told yesterday that whoever she spoke to had to speak to her manager about patient not being able to pay her copay. The issue is that patients are to be rescheduled if they cannot pay their copay at the time of the visit.  In certain cases, it is a management or physician call whether the patient is seen.   She states she never received a call back.  She cancelled her appointment for today because she did not have the money to pay the copay.  She also does not want Dr. Marlou Starks to know that she did not have the money.  I advised her that I would let you and Dr. Marlou Starks know about the situation, and her call would be returned. ------

## 2014-03-10 NOTE — Progress Notes (Signed)
Patient ID: Paula Navarro, female   DOB: 01-Dec-1950, 63 y.o.   MRN: [id number]  Chief Complaint  Patient presents with  . Breast Cancer Long Term Follow Up    HPI Paula Navarro is a 63 y.o. female.  The patient is a 63 year old black female who we saw originally in January. 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 She has been receiving neoadjuvant chemotherapy and has had a very good response. She returns today to schedule surgery. She states that she does not want to have mastectomies but would be willing to have breast conservation surgery. Her most recent MRI showed the size of these areas both to be approximately 2 cm. The enlarged lymph nodes bilaterally have resolved.  HPI  Past Medical History  Diagnosis Date  . Chronic bronchitis   . Palpitation     Tachycardia reported by monitor clerk during a symptomatic spell  . Chest pain     Admitted to [facility] in 09/2011; refused stress test  . Atrial septal defect 1996    Surgical repair in 1996  . Tobacco abuse     60 pack years; 1.5 packs per day  . Anxiety   . Anemia   . Breast cancer     Past Surgical History  Procedure Laterality Date  . Cholecystectomy    . Cesarean section    . Vein bypass surgery    . Breast biopsy Bilateral     Family History  Problem Relation Age of Onset  . Breast cancer Maternal Aunt     Social History History  Substance Use Topics  . Smoking status: Former Smoker -- 1.50 packs/day for 40 years    Types: Cigarettes  . Smokeless tobacco: Never Used  . Alcohol Use: No    Allergies  Allergen Reactions  . Codeine Other (See Comments)    Hot in chest    Current Outpatient Prescriptions  Medication Sig Dispense Refill  . LORazepam (ATIVAN) 1 MG tablet Take 1 tablet (1 mg total) by mouth every 6 (six) hours as needed for anxiety.  30 tablet  1  . pregabalin (LYRICA) 50 MG capsule Take 1 capsule (50 mg total) by mouth 2 (two) times daily as needed.   14 capsule  0  . prochlorperazine (COMPAZINE) 10 MG tablet Take 1 tablet (10 mg total) by mouth every 6 (six) hours as needed for nausea or vomiting.  30 tablet  1  . B Complex-C (SUPER B COMPLEX PO) Take 1 tablet by mouth daily.      . clobetasol cream (TEMOVATE) 0.86 % Apply 1 application topically 2 (two) times daily. Apply to hands and feet twice a day as needed.  30 g  1  . ferrous sulfate 325 (65 FE) MG EC tablet Take 1 tablet (325 mg total) by mouth 3 (three) times daily with meals.  30 tablet  3  . lidocaine-prilocaine (EMLA) cream Apply 1 application topically as needed.  30 g  1  . naproxen (NAPROSYN) 375 MG tablet Take 1 tablet (375 mg total) by mouth 2 (two) times daily.  14 tablet  0  . nystatin (MYCOSTATIN) 100000 UNIT/ML suspension Take 5 mLs (500,000 Units total) by mouth 4 (four) times daily. X 1 week.  240 mL  0  . oxyCODONE-acetaminophen (PERCOCET/ROXICET) 5-325 MG per tablet Take 1 tablet by mouth every 6 (six) hours as needed for severe pain.  20 tablet  0   No current facility-administered  medications for this visit.    Review of Systems Review of Systems  Constitutional: Negative.   HENT: Negative.   Eyes: Negative.   Respiratory: Negative.   Cardiovascular: Negative.   Gastrointestinal: Negative.   Endocrine: Negative.   Genitourinary: Negative.   Musculoskeletal: Negative.   Skin: Negative.   Allergic/Immunologic: Negative.   Neurological: Negative.   Hematological: Negative.   Psychiatric/Behavioral: Negative.     Blood pressure 132/72, pulse 87, temperature 98.3 F (36.8 C), height 5\' 2"  (1.575 m), weight 121 lb (54.885 kg), last menstrual period 05/20/2011.  Physical Exam Physical Exam  Constitutional: She is oriented to person, place, and time. She appears well-developed and well-nourished.  HENT:  Head: Normocephalic and atraumatic.  Eyes: Conjunctivae and EOM are normal. Pupils are equal, round, and reactive to light.  Neck: Normal range of  motion. Neck supple.  Cardiovascular: Normal rate, regular rhythm and normal heart sounds.   Pulmonary/Chest: Effort normal and breath sounds normal.  There is still a palpable mass in the 12:00 position of the left breast measuring approximately 2 cm. There is no palpable mass in the right breast. There is no palpable axillary, supraclavicular, or cervical lymphadenopathy.  Abdominal: Soft. Bowel sounds are normal.  Musculoskeletal: Normal range of motion.  Lymphadenopathy:    She has no cervical adenopathy.  Neurological: She is alert and oriented to person, place, and time.  Skin: Skin is warm and dry.  Psychiatric: She has a normal mood and affect. Her behavior is normal.    Data Reviewed As above  Assessment    At this point the patient would prefer bilateral breast conservation surgery. She may also be a candidate to go on the study for bilateral sentinel node mapping now that she has had neoadjuvant therapy and her lymphadenopathy has resolved. I will ask the study coordinator at the cancer center to contact her and let us know if she is a candidate. If she is not then she will need bilateral axillary lymph node dissections. I've discussed with her in detail the risks and benefits of the operation to do this as well as some of the technical aspects and she understands and wishes to proceed. She also realizes that if she has positive margins after this that she may then need a mastectomy and she is agreeable to this.     Plan    Plan for bilateral radioactive seed localized lumpectomy with sentinel node mapping if possible on study protocol and has not been bilateral axillary lymph node dissections        [name],Krayton Wortley S 03/10/2014, 12:37 PM

## 2014-03-16 ENCOUNTER — Telehealth: Payer: Self-pay

## 2014-03-16 NOTE — Telephone Encounter (Signed)
Call Report sent to scan.

## 2014-03-16 NOTE — Telephone Encounter (Signed)
Rcvd triage call report.    Followed up with patient.  Per call report - worse since finishing lyrica.  Per pt - no difference between Lyrica and Motrin/Percocet.  Pt states "something has to be done about this"  - don't want to go in to surgery with this.  Let pt know I would discuss with MD and call her back.  Pt voiced understanding.

## 2014-03-17 ENCOUNTER — Other Ambulatory Visit (INDEPENDENT_AMBULATORY_CARE_PROVIDER_SITE_OTHER): Payer: Self-pay | Admitting: General Surgery

## 2014-03-17 ENCOUNTER — Encounter: Payer: Self-pay | Admitting: *Deleted

## 2014-03-17 ENCOUNTER — Telehealth: Payer: Self-pay

## 2014-03-17 DIAGNOSIS — C50911 Malignant neoplasm of unspecified site of right female breast: Secondary | ICD-10-CM

## 2014-03-17 DIAGNOSIS — C50912 Malignant neoplasm of unspecified site of left female breast: Principal | ICD-10-CM

## 2014-03-17 NOTE — Telephone Encounter (Signed)
Returned pt call re: neuropathic pain.  Per Dr. Lindi Adie, pt referred to pain clinic.  Let pt know to expect a call from them.  Pt voiced understanding.

## 2014-03-17 NOTE — Progress Notes (Signed)
Received a referral from Dr. Marlou Starks for [facility] [phone] study.  Patient is not eligible for the trial due to her bilateral breast cancer.  Contralateral breast cancer is exclusion criteria.

## 2014-03-17 NOTE — Telephone Encounter (Signed)
Per Dr. Lindi Adie, refer pt to pain clinic for neuropathy.  Order placed.

## 2014-03-20 ENCOUNTER — Other Ambulatory Visit: Payer: Self-pay | Admitting: *Deleted

## 2014-03-20 ENCOUNTER — Telehealth: Payer: Self-pay | Admitting: *Deleted

## 2014-03-20 DIAGNOSIS — C50911 Malignant neoplasm of unspecified site of right female breast: Secondary | ICD-10-CM

## 2014-03-20 DIAGNOSIS — C50912 Malignant neoplasm of unspecified site of left female breast: Principal | ICD-10-CM

## 2014-03-20 MED ORDER — PREGABALIN 50 MG PO CAPS: 50.0000 mg | ORAL_CAPSULE | Freq: Two times a day (BID) | ORAL | Status: DC

## 2014-03-20 NOTE — Telephone Encounter (Signed)
Called in refill of medication for pt per [name] Cornetto,NP. Rx of Lyrica, Disp-60, Refill-0. Pt is to take 1 capsule two(2) daily. Message to be forwarded to Charlestine Massed, NP.

## 2014-03-23 ENCOUNTER — Telehealth (INDEPENDENT_AMBULATORY_CARE_PROVIDER_SITE_OTHER): Payer: Self-pay

## 2014-03-23 NOTE — Telephone Encounter (Signed)
Tried calling pt back. No answer or VM available. She did not qualify for the study. We can proceed with surgery. Orders are with schedulers.

## 2014-03-26 ENCOUNTER — Telehealth: Payer: Self-pay | Admitting: *Deleted

## 2014-03-26 ENCOUNTER — Telehealth (INDEPENDENT_AMBULATORY_CARE_PROVIDER_SITE_OTHER): Payer: Self-pay

## 2014-03-26 ENCOUNTER — Other Ambulatory Visit (INDEPENDENT_AMBULATORY_CARE_PROVIDER_SITE_OTHER): Payer: Self-pay | Admitting: General Surgery

## 2014-03-26 ENCOUNTER — Ambulatory Visit (INDEPENDENT_AMBULATORY_CARE_PROVIDER_SITE_OTHER): Payer: Medicare Other | Admitting: General Surgery

## 2014-03-26 DIAGNOSIS — C50912 Malignant neoplasm of unspecified site of left female breast: Principal | ICD-10-CM

## 2014-03-26 DIAGNOSIS — C50911 Malignant neoplasm of unspecified site of right female breast: Secondary | ICD-10-CM

## 2014-03-26 NOTE — Telephone Encounter (Signed)
I returned patients call. She asked why she was not a candidate for the study that was discussed at last visit. Advised she would need to ask the oncologist for the specific reason but that it probably has to do with type for tumor she has. Since they arrange the study at the cancer center, I advised her to speak to oncology about the specifics. \ She asked why she still has a port in if she is done with chemo. Let pt know we leave the port in until oncologist says it is ok to take it out. She asked why she should have surgery now with her having pain and numbness in her fingers and toes. Advised it is very important to have surgery to remove the cancer. We in no way want to keep the cancer in her due to the concern of it spreading. Strongly advised her to keep her surgery as scheduled. She has an appt with Dr Lindi Adie tomorrow. I told pt to discuss the reason she was not a candidate for the study and the numbness in her fingers. I also told her to ask if she could have PAC removed and if so for them to let us know so we can arrange for it. If not, we could discuss removing it at a different time.

## 2014-03-26 NOTE — Telephone Encounter (Signed)
PT. THINKS THE LYRICA IS DARKENING HER SKIN COLOR ON HER CHEEK BONES. VERBAL ORDER AND READ TO CINDEE BACON,NP- PT. MAY STOP THE LYRICA TODAY AND DISCUSS HER CONCERNS WITH DR.GUDENA AT [place]. PT. VOICES UNDERSTANDING.

## 2014-03-26 NOTE — Telephone Encounter (Signed)
Message copied by Carlene Coria on Thu Mar 26, 2014  4:42 PM ------      Message from: Overton Mam      Created: Thu Mar 26, 2014  2:10 PM      Regarding: Wants you to call her       Just spoke to the patient and she is at home now has medical questions about the surgery.  She is scheduled 9/9            Thanks      ----- Message -----         From: Merrie Roof, MD         Sent: 03/24/2014   8:41 AM           To: Overton Mam      Subject: RE: Can I do                                             No further tests. She needs bilateral rsl lumpectomy and axillary node dissections.      ----- Message -----         From: Overton Mam         Sent: 03/23/2014   4:37 PM           To: Merrie Roof, MD, Carlene Coria, CMA      Subject: RE: Can I do                                             Talked to her and she said you wanted her to have some kind of testing done before we schedule the surgery.            I told her I did not know anything about that but would wait to hear back either from you or her as to when to try and get her on the schedule.      ----- Message -----         From: Merrie Roof, MD         Sent: 03/22/2014   2:49 PM           To: Overton Mam      Subject: RE: Can I do                                             yes      ----- Message -----         From: Overton Mam         Sent: 03/21/2014  12:20 PM           To: Merrie Roof, MD      Subject: Can I do                                                 You have time on 8/28 to do this case if the patient will agree to do it, but I  don't have assist available.  Can I still do it?                         ------

## 2014-03-27 ENCOUNTER — Ambulatory Visit (HOSPITAL_BASED_OUTPATIENT_CLINIC_OR_DEPARTMENT_OTHER): Payer: Medicare Other | Admitting: Hematology and Oncology

## 2014-03-27 ENCOUNTER — Encounter: Payer: Self-pay | Admitting: Hematology and Oncology

## 2014-03-27 ENCOUNTER — Other Ambulatory Visit (HOSPITAL_BASED_OUTPATIENT_CLINIC_OR_DEPARTMENT_OTHER): Payer: Medicare Other

## 2014-03-27 VITALS — BP 128/72 | HR 87 | Temp 98.3°F | Resp 18 | Ht 62.0 in | Wt 126.5 lb

## 2014-03-27 DIAGNOSIS — C50911 Malignant neoplasm of unspecified site of right female breast: Secondary | ICD-10-CM

## 2014-03-27 DIAGNOSIS — C50912 Malignant neoplasm of unspecified site of left female breast: Principal | ICD-10-CM

## 2014-03-27 DIAGNOSIS — C50919 Malignant neoplasm of unspecified site of unspecified female breast: Secondary | ICD-10-CM

## 2014-03-27 LAB — COMPREHENSIVE METABOLIC PANEL (CC13)
ALT: 31 U/L (ref 0–55)
AST: 32 U/L (ref 5–34)
Albumin: 3.9 g/dL (ref 3.5–5.0)
Alkaline Phosphatase: 55 U/L (ref 40–150)
Anion Gap: 9 mEq/L (ref 3–11)
BUN: 9.2 mg/dL (ref 7.0–26.0)
CHLORIDE: 106 meq/L (ref 98–109)
CO2: 25 mEq/L (ref 22–29)
CREATININE: 0.7 mg/dL (ref 0.6–1.1)
Calcium: 9.8 mg/dL (ref 8.4–10.4)
GLUCOSE: 91 mg/dL (ref 70–140)
Potassium: 4.2 mEq/L (ref 3.5–5.1)
Sodium: 140 mEq/L (ref 136–145)
Total Bilirubin: 0.85 mg/dL (ref 0.20–1.20)
Total Protein: 7 g/dL (ref 6.4–8.3)

## 2014-03-27 LAB — CBC WITH DIFFERENTIAL/PLATELET
BASO%: 0.5 % (ref 0.0–2.0)
BASOS ABS: 0 10*3/uL (ref 0.0–0.1)
EOS ABS: 0.5 10*3/uL (ref 0.0–0.5)
EOS%: 5.9 % (ref 0.0–7.0)
HCT: 37.1 % (ref 34.8–46.6)
HEMOGLOBIN: 11.6 g/dL (ref 11.6–15.9)
LYMPH%: 26.5 % (ref 14.0–49.7)
MCH: 22.7 pg — ABNORMAL LOW (ref 25.1–34.0)
MCHC: 31.3 g/dL — ABNORMAL LOW (ref 31.5–36.0)
MCV: 72.7 fL — ABNORMAL LOW (ref 79.5–101.0)
MONO#: 0.6 10*3/uL (ref 0.1–0.9)
MONO%: 7.1 % (ref 0.0–14.0)
NEUT%: 60 % (ref 38.4–76.8)
NEUTROS ABS: 4.7 10*3/uL (ref 1.5–6.5)
NRBC: 0 % (ref 0–0)
PLATELETS: 268 10*3/uL (ref 145–400)
RBC: 5.1 10*6/uL (ref 3.70–5.45)
RDW: 15.8 % — ABNORMAL HIGH (ref 11.2–14.5)
WBC: 7.9 10*3/uL (ref 3.9–10.3)
lymph#: 2.1 10*3/uL (ref 0.9–3.3)

## 2014-03-27 NOTE — Progress Notes (Signed)
Patient Care Team: Lanette Hampshire, MD as PCP - General (Family Medicine) [name], MD (Cardiology)  DIAGNOSIS: Bilateral breast cancer   Primary site: Breast (Bilateral)   Staging method: AJCC 7th Edition   Clinical: Stage IIB (T2, N1, cM0)   Summary: Stage IIB (T2, N1, cM0)   Clinical comments: Staged at breast conference 08/13/13   SUMMARY OF ONCOLOGIC HISTORY:   Bilateral breast cancer   07/23/2013 Mammogram Bilateral breast masses. 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 Left: IDC grade 3 ER positive PR negative HER-2/neu negative Ki-67 80% T2 N1 on left T2 NX right    09/15/2013 - 02/13/2014 Neo-Adjuvant Chemotherapy 5 fluorouracil, epirubicin and cyclophosphamide with Neulasta and 6 cycles followed by weekly Taxol started 12/16/2013 x8 weeks stopped 02/03/2014 for neuropathy   02/19/2014 Breast MRI Right breast: 1.9 x 0.4 x 0.8 cm (previously 1.9 x 1.1 x 1.1 cm); left breast 2.5 x 2 x 1.7 cm (previously 2.6 x 2.2 x 2.3 cm) other non-mass enhancement result, no residual axillary lymph nodes    CHIEF COMPLIANT: Pain in her hands and feet related to neuropathy tingling and numbness  INTERVAL HISTORY: Paula Navarro is a 63 year old American lady with above-mentioned history of bilateral breast cancers. She had neoadjuvant chemotherapy and had an MRI scan that revealed excellent response and the primary tumors as well as in the axilla. She met with general surgery and has an appointment to undergo bilateral lumpectomies in 04/08/2014. She is here today to discuss the treatment plan with me. Her major complaint is neuropathy in her hands and feet which causes severe tingling and numbness. She also has pain in her hands and feet for which she takes Motrin. She was prescribed Neurontin and Lyrica but neither of  which helped her with this symptom.   REVIEW OF SYSTEMS:   Constitutional: Denies fevers, chills or abnormal weight loss Eyes: Denies blurriness of vision Ears, nose, mouth, throat, and face: Denies mucositis or sore throat Respiratory: Denies cough, dyspnea or wheezes Cardiovascular: Denies palpitation, chest discomfort or lower extremity swelling Gastrointestinal:  Denies nausea, heartburn or change in bowel habits Skin: Denies abnormal skin rashes Lymphatics: Denies new lymphadenopathy or easy bruising Neurological:Denies numbness, tingling or new weaknesses Behavioral/Psych: Mood is stable, no new changes  Breast: Patient feels a nodules are smaller in the breast. All other systems were reviewed with the patient and are negative.  I have reviewed the past medical history, past surgical history, social history and family history with the patient and they are unchanged from previous note.  ALLERGIES:  is allergic to codeine.  MEDICATIONS:  Current Outpatient Prescriptions  Medication Sig Dispense Refill  . B Complex-C (SUPER B COMPLEX PO) Take 1 tablet by mouth daily.      . clobetasol cream (TEMOVATE) 9.73 % Apply 1 application topically 2 (two) times daily. Apply to hands and feet twice a day as needed.  30 g  1  . ferrous sulfate 325 (65 FE) MG EC tablet Take 1 tablet (325 mg total) by mouth 3 (three) times daily with meals.  30 tablet  3  . lidocaine-prilocaine (EMLA) cream Apply 1 application topically as needed.  30 g  1  . LORazepam (ATIVAN) 1 MG tablet Take 1 tablet (1 mg total) by mouth every 6 (six) hours as needed for anxiety.  30 tablet  0  . naproxen (NAPROSYN) 375 MG tablet Take 1 tablet (375 mg total) by mouth 2 (two) times daily.  14 tablet  0  . oxyCODONE-acetaminophen (PERCOCET/ROXICET) 5-325 MG per tablet Take 1 tablet by mouth every 6 (six) hours as needed for severe pain.  20 tablet  0  . prochlorperazine (COMPAZINE) 10 MG tablet Take 1 tablet (10 mg total) by  mouth every 6 (six) hours as needed for nausea or vomiting.  30 tablet  1   No current facility-administered medications for this visit.    PHYSICAL EXAMINATION: ECOG PERFORMANCE STATUS: 1 - Symptomatic but completely ambulatory  Filed Vitals:   03/27/14 1400  BP: 128/72  Pulse: 87  Temp: 98.3 F (36.8 C)  Resp: 18   Filed Weights   03/27/14 1400  Weight: 126 lb 8 oz (57.38 kg)    GENERAL:alert, no distress and comfortable SKIN: skin color, texture, turgor are normal, no rashes or significant lesions EYES: normal, Conjunctiva are pink and non-injected, sclera clear OROPHARYNX:no exudate, no erythema and lips, buccal mucosa, and tongue normal  NECK: supple, thyroid normal size, non-tender, without nodularity LYMPH:  no palpable lymphadenopathy in the cervical, axillary or inguinal LUNGS: clear to auscultation and percussion with normal breathing effort HEART: regular rate & rhythm and no murmurs and no lower extremity edema ABDOMEN:abdomen soft, non-tender and normal bowel sounds Musculoskeletal:no cyanosis of digits and no clubbing  NEURO: alert & oriented x 3 with fluent speech, is a peripheral sensory neuropathy.   LABORATORY DATA:  I have reviewed the data as listed   Chemistry      Component Value Date/Time   NA 140 03/27/2014 1348   NA 141 09/23/2012 0414   K 4.2 03/27/2014 1348   K 3.8 09/23/2012 0414   CL 107 09/23/2012 0414   CO2 25 03/27/2014 1348   CO2 29 10/30/2011 1419   BUN 9.2 03/27/2014 1348   BUN 7 09/23/2012 0414   CREATININE 0.7 03/27/2014 1348   CREATININE 0.90 09/23/2012 0414      Component Value Date/Time   CALCIUM 9.8 03/27/2014 1348   CALCIUM 10.1 10/30/2011 1419   ALKPHOS 55 03/27/2014 1348   AST 32 03/27/2014 1348   ALT 31 03/27/2014 1348   BILITOT 0.85 03/27/2014 1348       Lab Results  Component Value Date   WBC 7.9 03/27/2014   HGB 11.6 03/27/2014   HCT 37.1 03/27/2014   MCV 72.7* 03/27/2014   PLT 268 03/27/2014   NEUTROABS 4.7 03/27/2014      RADIOGRAPHIC STUDIES: I have personally reviewed the radiology reports and agreed with their findings. No results found.   ASSESSMENT & PLAN:  Bilateral breast cancer Bilateral breast cancers: Left breast T2, N1, M0 right breast T2 NX M0 status post neoadjuvant chemotherapy with FEC x6 followed by weekly Taxol x8 stopped for neuropathy. Patient had a repeat MRI that showed a good response and axilla and shrinkage in the primary breast tumors. She will undergo bilateral lumpectomies on 04/08/2014. I would like to see her back on September 18 discuss the pathology. After lumpectomies she will need to undergo radiation therapy. I explained this to the patient and although she was reluctant she was interested in following these recommendations.  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 She did not like how Neurontin or Lyrica made her feel. She takes Motrin most of the time. I recommended that she take a multivitamin with B6.   No orders of the defined types were placed in this  encounter.   The patient has a good understanding of the overall plan. she agrees with it. She will call with any problems that may develop before her next visit here.  I spent 25 minutes counseling the patient face to face. The total time spent in the appointment was 30 minutes and more than 50% was on counseling and review of test results    Rulon Eisenmenger, MD 03/27/2014 2:39 PM

## 2014-03-27 NOTE — Assessment & Plan Note (Addendum)
Bilateral breast cancers: Left breast T2, N1, M0 right breast T2 NX M0 status post neoadjuvant chemotherapy with FEC x6 followed by weekly Taxol x8 stopped for neuropathy. Patient had a repeat MRI that showed a good response and axilla and shrinkage in the primary breast tumors. She will undergo bilateral lumpectomies on 04/08/2014. I would like to see her back on September 18 discuss the pathology. After lumpectomies she will need to undergo radiation therapy. I explained this to the patient and although she was reluctant she was interested in following these recommendations.  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 She did not like how Neurontin or Lyrica made her feel. She takes Motrin most of the time. I recommended that she take a multivitamin with B6.

## 2014-03-30 ENCOUNTER — Telehealth: Payer: Self-pay | Admitting: *Deleted

## 2014-03-30 ENCOUNTER — Telehealth (INDEPENDENT_AMBULATORY_CARE_PROVIDER_SITE_OTHER): Payer: Self-pay

## 2014-03-30 ENCOUNTER — Telehealth: Payer: Self-pay | Admitting: Medical Oncology

## 2014-03-30 ENCOUNTER — Telehealth: Payer: Self-pay | Admitting: Hematology and Oncology

## 2014-03-30 ENCOUNTER — Other Ambulatory Visit: Payer: Self-pay | Admitting: *Deleted

## 2014-03-30 DIAGNOSIS — C50919 Malignant neoplasm of unspecified site of unspecified female breast: Secondary | ICD-10-CM

## 2014-03-30 MED ORDER — CLOBETASOL PROPIONATE 0.05 % EX CREA: 1.0000 "application " | TOPICAL_CREAM | Freq: Two times a day (BID) | CUTANEOUS | Status: DC

## 2014-03-30 NOTE — Telephone Encounter (Signed)
Patient called with a question about surgery. Returned patient's call and referred her to surgeon's office with questions about upcoming surgery.

## 2014-03-30 NOTE — Telephone Encounter (Signed)
Pt called to verify surgery and date of surgery.

## 2014-03-30 NOTE — Telephone Encounter (Signed)
, °

## 2014-03-30 NOTE — Telephone Encounter (Signed)
Pt called stating that she saw Dr. Lindi Adie on Friday. She states he told her that he was going to call in a prescription for her. She said it was something for her numbness in her hands and feet. I read Dr. Geralyn Flash note and he asked her to take a multivitamin with B6. She states that Dr.Khan had her start taking a super B complex with C. She has been taking this for a long time and it does not help. I asked her if she has been taking the neurontin or the lyrica. She states the lyrica has not done anything for her and she did try the gabapentin. 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 I discussed that she is not going to see an immediate relief and it can take a long time before she see improvement.  She has gabapentin and she is going to restart taking this today. She takes it three times daily. She states she will restart it and give it some time. She follows up with Dr. Lindi Adie 04/10/14.

## 2014-04-01 ENCOUNTER — Encounter (HOSPITAL_BASED_OUTPATIENT_CLINIC_OR_DEPARTMENT_OTHER): Payer: Self-pay | Admitting: *Deleted

## 2014-04-01 ENCOUNTER — Other Ambulatory Visit: Payer: Self-pay | Admitting: Oncology

## 2014-04-01 NOTE — Progress Notes (Signed)
Pt needed a lot of education understanding about surgery Reviewed all preop-and post op routine-to bring overnight bag and all meds

## 2014-04-02 ENCOUNTER — Other Ambulatory Visit: Payer: Self-pay

## 2014-04-02 ENCOUNTER — Ambulatory Visit (INDEPENDENT_AMBULATORY_CARE_PROVIDER_SITE_OTHER): Payer: Medicare Other | Admitting: General Surgery

## 2014-04-02 DIAGNOSIS — T451X5A Adverse effect of antineoplastic and immunosuppressive drugs, initial encounter: Principal | ICD-10-CM

## 2014-04-02 DIAGNOSIS — G620 Drug-induced polyneuropathy: Secondary | ICD-10-CM

## 2014-04-02 MED ORDER — MORPHINE SULFATE 15 MG PO TABS: 15.0000 mg | ORAL_TABLET | Freq: Four times a day (QID) | ORAL | Status: DC | PRN

## 2014-04-02 NOTE — Progress Notes (Signed)
Pt called After Hours Triage 04/01/14 16:39.  Report received and sent to scan.  Pt called clinic 9/3 909 am and 1204 pm with same complaints.  Per Dr. Lindi Adie - MSIR 15 mg po q6hr prn, qty 30.  Spoke with patient and let her know prescription for painkillers adequate to carry her to surgery was written and would be at the front desk for pickup.  Pt voiced understanding; scrip to be picked up by son Asja Frommer prior to 4 pm.

## 2014-04-06 ENCOUNTER — Encounter (HOSPITAL_COMMUNITY): Payer: Self-pay | Admitting: Emergency Medicine

## 2014-04-06 ENCOUNTER — Emergency Department (HOSPITAL_COMMUNITY)
Admission: EM | Admit: 2014-04-06 | Discharge: 2014-04-06 | Disposition: A | Discharge status: Home / Self Care | Payer: Medicare Other | Attending: Emergency Medicine | Admitting: Emergency Medicine

## 2014-04-06 DIAGNOSIS — T50905A Adverse effect of unspecified drugs, medicaments and biological substances, initial encounter: Secondary | ICD-10-CM

## 2014-04-06 DIAGNOSIS — T40605A Adverse effect of unspecified narcotics, initial encounter: Secondary | ICD-10-CM | POA: Diagnosis not present

## 2014-04-06 DIAGNOSIS — J209 Acute bronchitis, unspecified: Secondary | ICD-10-CM | POA: Insufficient documentation

## 2014-04-06 DIAGNOSIS — Z87891 Personal history of nicotine dependence: Secondary | ICD-10-CM | POA: Insufficient documentation

## 2014-04-06 DIAGNOSIS — J440 Chronic obstructive pulmonary disease with acute lower respiratory infection: Secondary | ICD-10-CM | POA: Insufficient documentation

## 2014-04-06 DIAGNOSIS — G8929 Other chronic pain: Secondary | ICD-10-CM | POA: Diagnosis not present

## 2014-04-06 DIAGNOSIS — Z853 Personal history of malignant neoplasm of breast: Secondary | ICD-10-CM | POA: Insufficient documentation

## 2014-04-06 DIAGNOSIS — L299 Pruritus, unspecified: Secondary | ICD-10-CM | POA: Insufficient documentation

## 2014-04-06 DIAGNOSIS — Z79899 Other long term (current) drug therapy: Secondary | ICD-10-CM | POA: Insufficient documentation

## 2014-04-06 DIAGNOSIS — F411 Generalized anxiety disorder: Secondary | ICD-10-CM | POA: Insufficient documentation

## 2014-04-06 HISTORY — DX: Other chronic pain: G89.29

## 2014-04-06 MED ORDER — OXYCODONE-ACETAMINOPHEN 5-325 MG PO TABS: ORAL_TABLET | ORAL | Status: DC

## 2014-04-06 MED ORDER — DIPHENHYDRAMINE HCL 25 MG PO CAPS
25.0000 mg | ORAL_CAPSULE | Freq: Once | ORAL | Status: AC
  Administered 2014-04-06: 25 mg via ORAL
  Filled 2014-04-06: qty 1

## 2014-04-06 MED ORDER — OXYCODONE-ACETAMINOPHEN 5-325 MG PO TABS
2.0000 | ORAL_TABLET | Freq: Once | ORAL | Status: AC
  Administered 2014-04-06: 2 via ORAL
  Filled 2014-04-06: qty 2

## 2014-04-06 NOTE — ED Notes (Addendum)
Pt reports started taking morphine pills x2 days ago. Pt reports ever since bilateral hand itching and burning. Pt reports trying OTc products for itching with no relief. nad noted. Airway patent. Pt denies any fevers,n/v. Pt reports finished chemo treatments for breast cancer on July 2015. Pt denies any new self care products.

## 2014-04-06 NOTE — Discharge Instructions (Signed)
°Emergency Department Resource Guide °1) Find a Doctor and Pay Out of Pocket °Although you won't have to find out who is covered by your insurance plan, it is a good idea to ask around and get recommendations. You will then need to call the office and see if the doctor you have chosen will accept you as a new patient and what types of options they offer for patients who are self-pay. Some doctors offer discounts or will set up payment plans for their patients who do not have insurance, but you will need to ask so you aren't surprised when you get to your appointment. ° °2) Contact Your Local Health Department °Not all health departments have doctors that can see patients for sick visits, but many do, so it is worth a call to see if yours does. If you don't know where your local health department is, you can check in your phone book. The CDC also has a tool to help you locate your state's health department, and many state websites also have listings of all of their local health departments. ° °3) Find a Walk-in Clinic °If your illness is not likely to be very severe or complicated, you may want to try a walk in clinic. These are popping up all over the country in pharmacies, drugstores, and shopping centers. They're usually staffed by nurse practitioners or physician assistants that have been trained to treat common illnesses and complaints. They're usually fairly quick and inexpensive. However, if you have serious medical issues or chronic medical problems, these are probably not your best option. ° °No Primary Care Doctor: °- Call [facility] at  832-8000 - they can help you locate a primary care doctor that  accepts your insurance, provides certain services, etc. °- Physician Referral Service- 1-800-533-3463 ° °Chronic Pain Problems: °Organization         Address  Phone   Notes  °[facility]  (336) 297-2271 Patients need to be referred by their primary care doctor.  ° °Medication  Assistance: °Organization         Address  Phone   Notes  °[place] [facility] [address] °[place], [place] [address] (336) 641-8030 --Must be a resident of [place] °-- Must have NO insurance coverage whatsoever (no Medicaid/ Medicare, etc.) °-- The pt. MUST have a primary care doctor that directs their care regularly and follows them in the community °  °[facility]  (866) 331-1348   °[employer]  (888) 892-1162   ° °Agencies that provide inexpensive medical care: °Organization         Address  Phone   Notes  °[facility]  (336) 832-8035   ° Internal Medicine    (336) 832-7272   °[facility] [address] °[place], [place] [address] (336) 832-4777   °[facility] [address], °[place] (336) 271-4999   °[facility]    (336) 373-0678   °[facility]    (336) 272-1050   °[facility] ° [address], [place] Phone:  (336) 832-4444, Fax:  (336) 832-4440 Hours of Operation:  9 am - 6 pm, M-F.  Also accepts Medicaid/Medicare and self-pay.  °[facility] ° [address],  Phone: (336) 832-3150, Fax: (336) 832-3151. Hours of Operation:  8:30 am - 5:30 pm, M-F.  Also accepts Medicaid and self-pay.  °[facility] [place] [address], [place] Phone: (336) 878-6027   °[facility] [address], [place], [place] (336)723-1848, Ext. 123 Mondays & Thursdays: 7-9 AM.  First 15 patients are seen on a first come, first serve basis. °  ° °Medicaid-accepting [place] Providers: ° °Organization         Address  Phone   Notes  °[facility] [address], [place] (336) 641-2100 Also accepts self-pay patients.  °[facility] [address], [place] ° (336) 856-9996   °[facility] [address], [place]  (336) 288-8857   °[facility] [address] [place] [address], [place] (336) 299-7000   °Veita Bland [address], [place]  ° (336) 373-1557 Only accepts [place] Access Medicaid patients after they have their name applied to their card.  ° °Self-Pay (no insurance) in [place]: ° °Organization         Address  Phone   Notes  °Sickle Cell Patients, [facility] [address], [place] (336) 832-1970   °[facility] [address], [place] (336) 832-4400   °[facility] ° [address], [place] (336) 992-4800   °[facility]/Dr. Osei-Bonsu ° [address] [place] [address], [place] or [address], [place] (336) 841-8500 Phone number for both [place] and [place] locations is the same.  °[facility] [address], [place] (336) 299-0000   °[facility] [address] [place] [address], [place] or [address] (336) 852-7530 °(336) 878-2260   °[facility] [address], [place] (336) 350-1642, phone; (336) 294-5005, fax Sees patients 1st and 3rd Saturday of every month.  Must not qualify for public or private insurance (i.e. Medicaid, Medicare, [place] Health Choice, Veterans' Benefits) • Household income should be no more than 200% of the poverty level •The clinic cannot treat you if you are pregnant or think you are pregnant • Sexually transmitted diseases are not treated at the clinic.  ° ° °Dental Care: °Organization         Address  Phone  Notes  °[place] [facility] [address], [place] (336) 641-6152 Accepts children up to age 21 who are enrolled in Medicaid or [place] Health Choice; pregnant women with a Medicaid card; and children who have applied for Medicaid or [place] Health Choice, but were declined, whose parents can pay a reduced fee at time of service.  °[place]  [facility] [place]  [address], [place] (336) 641-7733 Accepts children up to age 21 who are enrolled in Medicaid or [place] Health Choice; pregnant women with a Medicaid card; and children who have applied for Medicaid or  Health Choice, but were declined, whose parents can pay a reduced fee at time of service.  °[facility] ° [address], [place] (336) 641-4533 Patients are seen by appointment only. Walk-ins are not accepted. [facility] will see patients 18 years of age and older. °Monday - Tuesday (8am-5pm) °Most Wednesdays (8:30-5pm) °$30 per visit, cash only  °[facility] ° [address], [place] (336) 641-4533 Patients are seen by appointment only. Walk-ins are not accepted. [facility] will see patients 18 years of age and older. °One   Wednesday Evening (Monthly: Volunteer Based).  $30 per visit, cash only  °[facility]  (919) 537-3737 for adults; Children under age 4, call Graduate Pediatric Dentistry at (919) [phone]. Children aged 4-14, please call (919) [phone] to request a pediatric application. ° Dental services are provided in all areas of dental care including fillings, crowns and bridges, complete and partial dentures, implants, gum treatment, root canals, and extractions. Preventive care is also provided. Treatment is provided to both adults and children. °Patients are selected via a lottery and there is often a waiting list. °  °[facility] [address], °[place] ° (336) 763-8833 [email or web address] °  °[facility] [address], [place], [place] (336)723-1848, Ext. 123 Second and Fourth Thursday of each month, opens at 6:30 AM; Clinic ends at 9 AM.  Patients are seen on a first-come first-served basis, and a limited number are seen during each clinic.  ° °[facility] ° [address], [place], [place] (336) 723-7904    Eligibility Requirements °You must have lived in [place], or [place] counties for at least the last three months. °  You cannot be eligible for state or federal sponsored healthcare insurance, including Veterans Administration, Medicaid, or Medicare. °  You generally cannot be eligible for healthcare insurance through your employer.  °  How to apply: °Eligibility screenings are held every Tuesday and Wednesday afternoon from 1:00 pm until 4:00 pm. You do not need an appointment for the interview!  °[facility] [address], [place], [place] 336-631-2330   °[facility]  336-342-8273   °[facility]  336-703-3100   °[facility]  336-570-6415   ° °Behavioral Health Resources in the Community: °Intensive Outpatient Programs °Organization         Address  Phone  Notes  °[facility] [address], [place], [place] 336-878-6098   °[facility] [address], [place], [place] 336-832-9800   °[facility] [address], [place], [place] ° 336-882-2125   °[facility] [address],  °[place], [place] 1-800-853-5163 or 336-641-4981   °Substance Abuse Resources °Organization         Address  Phone  Notes  °[facility]  336-882-2125   °[facility]  336-784-9470   °[facility]  336-285-9073   °[facility]  336-845-3988   °Residential & Outpatient Substance Abuse Program  1-800-659-3381   °Psychological Services °Organization         Address  Phone  Notes  °[facility]  336- 832-9600   °[facility]  336- 378-7881   °[facility] [address], [place] 1-800-853-5163 or 336-641-4981   ° °Mobile Crisis Teams °Organization         Address  Phone  Notes  °[facility], Mobile Crisis Care Unit  1-877-626-1772   °Assertive °[facility] ° [address]  [place], [place] 336-834-9664   °Sharon DeEsch [address] °[place] 336-554-5454   ° °Self-Help/Support Groups °Organization         Address  Phone             Notes  °[facility]  - variety of support groups  336- 373-1402 Call for more information  °Narcotics Anonymous (NA), [facility] [address], °[place]  2 meetings at this location  ° °  Residential Treatment Programs Organization         Address  Phone  Notes  [facility] [address]    [place]  1-(913)843-9239   [facility]  [address] [place] [address], [place], [place]   [place], [place] (954) 033-0525 Admissions: 8am-3pm M-F  [facility] [address]    [place], [place] 542-706-2376   [facility] [address] [place], [place], [place]   [facility] [address],  [place], [place]   [facility] - Intensive Outpatient [address], [place], [place]   [facility] [address]  [place], [place] 1-(818)266-5319 or 629-722-6998   [facility] [address] [place], [place] Accepts Medicaid  [facility] [address]  [place] 1-508-679-5409 Substance Abuse/Addiction Treatment   [facility]         Address  Phone  Notes  [facility]  364-148-7068   Domenic Schwab, PhD [address] [place], [place]   516-519-1280 or [phone]   [facility] [place], [place] (213)888-5398   [facility] [address] [place], [place] 330-542-9408 Insurance/Medicaid/sponsorship through [facility] [address]                                    [place], [place] (769) [phone] [place] [address], [place] [phone]    Dr. Adele Schilder  [phone]   [facility] Dept. 1) [address] [place] 2) [place] 3)  [address], [place] [phone]  [phone]   [place] 307-765-4911 or 316-027-1765 (After Hours)       Take the new prescription as directed. Take over the counter benadryl, as directed on packaging, as needed for itching.  Call your regular medical doctor tomorrow to schedule a follow up appointment within the next 2 days.  Return to the Emergency Department immediately sooner if worsening.

## 2014-04-06 NOTE — ED Notes (Signed)
Pt requesting to stay in room until family gets here due to her being a cancer pt and not wanting to be around sick patients in waiting room.

## 2014-04-06 NOTE — ED Notes (Signed)
Pt states she has been taking morphine pills for two days. States she thinks that is why she is itching. States she has been taking claratin and using hydrocortisone cream that is not working. States she has not tried benadryl. Also, she wants the EDP to write her a Rx for a different pain medication

## 2014-04-06 NOTE — ED Provider Notes (Signed)
CSN: [id number]     Arrival date & time 04/06/14  4431 History   First MD Initiated Contact with Patient 04/06/14 [id number]     Chief Complaint  Patient presents with  . Pruritis      HPI Pt was seen at 0930. Per pt, c/o gradual onset and persistence of constant "itching hands" that began after she started taking PO morphine for her chronic pain x2 days. Pt states she has hx of chronic pain and her PMD rx her PO morphine. Endorses she has taken IV morphine previously without adverse reaction. Pt states she tried OTC Claritin and hydrocortisone cream without relief. Pt states her LD PO morphine was last night. Denies CP/SOB, no wheezing/stridor, no intra-oral edema, no diffuse rash/itching.    Past Medical History  Diagnosis Date  . Chronic bronchitis   . Palpitation     Tachycardia reported by monitor clerk during a symptomatic spell  . Chest pain     Admitted to [facility] in 09/2011; refused stress test  . Atrial septal defect 1996    Surgical repair in 1996  . Tobacco abuse     60 pack years; 1.5 packs per day  . Anxiety   . Anemia   . Breast cancer   . Wears dentures     top  . COPD (chronic obstructive pulmonary disease)     on xray  . Chronic pain    Past Surgical History  Procedure Laterality Date  . Cholecystectomy    . Cesarean section      x3  . Breast biopsy Bilateral   . Tubal ligation    . Asd repair, ostium primum  1996    dr Roxy Horseman  . Port a cath revision  1/15    put in    Family History  Problem Relation Age of Onset  . Breast cancer Maternal Aunt    History  Substance Use Topics  . Smoking status: Former Smoker -- 1.50 packs/day for 40 years    Types: Cigarettes    Quit date: 08/01/2013  . Smokeless tobacco: Never Used  . Alcohol Use: No    Review of Systems ROS: Statement: All systems negative except as marked or noted in the HPI; Constitutional: Negative for fever and chills. ; ; Eyes: Negative for eye pain, redness and discharge. ; ; ENMT: Negative  for ear pain, hoarseness, nasal congestion, sinus pressure and sore throat. ; ; Cardiovascular: Negative for chest pain, palpitations, diaphoresis, dyspnea and peripheral edema. ; ; Respiratory: Negative for cough, wheezing and stridor. ; ; Gastrointestinal: Negative for nausea, vomiting, diarrhea, abdominal pain, blood in stool, hematemesis, jaundice and rectal bleeding. . ; ; Genitourinary: Negative for dysuria, flank pain and hematuria. ; ; Musculoskeletal: Negative for back pain and neck pain. Negative for swelling and trauma.; ; Skin: +itching. Negative for rash, abrasions, blisters, bruising and skin lesion.; ; Neuro: Negative for headache, lightheadedness and neck stiffness. Negative for weakness, altered level of consciousness , altered mental status, extremity weakness, paresthesias, involuntary movement, seizure and syncope.        Allergies  Codeine  Home Medications   Prior to Admission medications   Medication Sig Start Date End Date Taking? Authorizing Provider  B Complex-C (SUPER B COMPLEX PO) Take 1 tablet by mouth daily.   Yes Historical Provider, MD  clobetasol cream (TEMOVATE) 8.67 % Apply 1 application topically 2 (two) times daily. Apply to hands and feet twice a day as needed. 03/30/14  Yes Rulon Eisenmenger, MD  gabapentin (NEURONTIN) 300 MG capsule TAKE 1 CAPSULE (300 MG TOTAL) BY MOUTH 3 (THREE) TIMES DAILY. 04/01/14  Yes Chauncey Cruel, MD  lidocaine-prilocaine (EMLA) cream Apply 1 application topically as needed. 01/20/14  Yes Maryanna Shape, NP  loratadine (CLARITIN) 10 MG tablet Take 10 mg by mouth daily as needed for itching.   Yes Historical Provider, MD  LORazepam (ATIVAN) 1 MG tablet Take 1 tablet (1 mg total) by mouth every 6 (six) hours as needed for anxiety. 03/10/14  Yes Minette Headland, NP  morphine (MSIR) 15 MG tablet Take 1 tablet (15 mg total) by mouth every 6 (six) hours as needed for severe pain. 04/02/14  Yes Rulon Eisenmenger, MD  prochlorperazine  (COMPAZINE) 10 MG tablet Take 1 tablet (10 mg total) by mouth every 6 (six) hours as needed for nausea or vomiting. 02/02/14  Yes Minette Headland, NP   BP 123/62  Pulse 92  Temp(Src) 98.6 F (37 C) (Oral)  Resp 18  Ht 5\' 2"  (1.575 m)  Wt 125 lb (56.7 kg)  BMI 22.86 kg/m2  SpO2 97%  LMP 05/20/2011 Physical Exam 0935 Physical examination:  Nursing notes reviewed; Vital signs and O2 SAT reviewed;  Constitutional: Well developed, Well nourished, Well hydrated, In no acute distress; Head:  Normocephalic, atraumatic; Eyes: EOMI, PERRL, No scleral icterus; ENMT: Mouth and pharynx normal, Mucous membranes moist. Mouth and pharynx without lesions. No tonsillar exudates. No intra-oral edema. No submandibular or sublingual edema. No hoarse voice, no drooling, no stridor. No pain with manipulation of larynx. No trismus.; Neck: Supple, Full range of motion, No lymphadenopathy; Cardiovascular: Regular rate and rhythm, No murmur, rub, or gallop; Respiratory: Breath sounds clear & equal bilaterally, No rales, rhonchi, wheezes.  Speaking full sentences with ease, Normal respiratory effort/excursion; Chest: Nontender, Movement normal; Abdomen: Soft, Nontender, Nondistended, Normal bowel sounds; Genitourinary: No CVA tenderness; Extremities: Pulses normal, Bilat hands without rash, no erythema, no edema, no ecchymosis. No tenderness, No edema, No calf edema or asymmetry.; Neuro: AA&Ox3, Major CN grossly intact.  Speech clear. No gross focal motor or sensory deficits in extremities.; Skin: Color normal, Warm, Dry. No diffuse rash.    ED Course  Procedures     MDM  MDM Reviewed: previous chart, nursing note and vitals    1030:  Pt's symptoms appear to be reaction to PO morphine. Pt given benadryl. Pt also requested "something for my pains" because she did not take her usual pain medication (morphine) this morning. Pt given percocet. After meds, pt states she "feels better" and wants to go home now. Will  continue benadryl prn, d/c morphine PO, rx percocet for her chronic pain. Pt states she already has f/u appt in 2 days. Encouraged to keep that appt. Dx d/w pt.  Questions answered.  Verb understanding, agreeable to d/c home with outpt f/u.    Francine Graven, DO 04/07/14 1729

## 2014-04-06 NOTE — ED Notes (Signed)
Gram crackers provided per request.

## 2014-04-07 ENCOUNTER — Ambulatory Visit
Admission: RE | Admit: 2014-04-07 | Discharge: 2014-04-07 | Disposition: A | Discharge status: Home / Self Care | Payer: Medicare Other | Source: Ambulatory Visit | Attending: General Surgery | Admitting: General Surgery

## 2014-04-07 DIAGNOSIS — C50912 Malignant neoplasm of unspecified site of left female breast: Principal | ICD-10-CM

## 2014-04-07 DIAGNOSIS — C50911 Malignant neoplasm of unspecified site of right female breast: Secondary | ICD-10-CM

## 2014-04-07 MED ORDER — CEFAZOLIN SODIUM-DEXTROSE 2-3 GM-% IV SOLR
INTRAVENOUS | Status: AC
  Filled 2014-04-07: qty 50

## 2014-04-08 ENCOUNTER — Encounter (HOSPITAL_BASED_OUTPATIENT_CLINIC_OR_DEPARTMENT_OTHER): Payer: Medicare Other | Admitting: Certified Registered"

## 2014-04-08 ENCOUNTER — Ambulatory Visit
Admission: RE | Admit: 2014-04-08 | Discharge: 2014-04-08 | Disposition: A | Discharge status: Home / Self Care | Payer: Medicare Other | Source: Ambulatory Visit | Attending: General Surgery | Admitting: General Surgery

## 2014-04-08 ENCOUNTER — Encounter (HOSPITAL_BASED_OUTPATIENT_CLINIC_OR_DEPARTMENT_OTHER)
Admission: RE | Disposition: A | Discharge status: Home / Self Care | Payer: Self-pay | Source: Ambulatory Visit | Attending: General Surgery

## 2014-04-08 ENCOUNTER — Ambulatory Visit (HOSPITAL_BASED_OUTPATIENT_CLINIC_OR_DEPARTMENT_OTHER): Payer: Medicare Other | Admitting: Certified Registered"

## 2014-04-08 ENCOUNTER — Ambulatory Visit (HOSPITAL_BASED_OUTPATIENT_CLINIC_OR_DEPARTMENT_OTHER)
Admission: RE | Admit: 2014-04-08 | Discharge: 2014-04-09 | Disposition: A | Discharge status: Home / Self Care | Payer: Medicare Other | Source: Ambulatory Visit | Attending: General Surgery | Admitting: General Surgery

## 2014-04-08 ENCOUNTER — Encounter (HOSPITAL_BASED_OUTPATIENT_CLINIC_OR_DEPARTMENT_OTHER): Payer: Self-pay | Admitting: Certified Registered"

## 2014-04-08 DIAGNOSIS — Z9089 Acquired absence of other organs: Secondary | ICD-10-CM | POA: Insufficient documentation

## 2014-04-08 DIAGNOSIS — R002 Palpitations: Secondary | ICD-10-CM | POA: Diagnosis not present

## 2014-04-08 DIAGNOSIS — C50912 Malignant neoplasm of unspecified site of left female breast: Principal | ICD-10-CM

## 2014-04-08 DIAGNOSIS — Z803 Family history of malignant neoplasm of breast: Secondary | ICD-10-CM | POA: Diagnosis not present

## 2014-04-08 DIAGNOSIS — J42 Unspecified chronic bronchitis: Secondary | ICD-10-CM | POA: Diagnosis not present

## 2014-04-08 DIAGNOSIS — C50919 Malignant neoplasm of unspecified site of unspecified female breast: Secondary | ICD-10-CM | POA: Diagnosis not present

## 2014-04-08 DIAGNOSIS — F17200 Nicotine dependence, unspecified, uncomplicated: Secondary | ICD-10-CM | POA: Insufficient documentation

## 2014-04-08 DIAGNOSIS — Q2111 Secundum atrial septal defect: Secondary | ICD-10-CM | POA: Diagnosis not present

## 2014-04-08 DIAGNOSIS — Z79899 Other long term (current) drug therapy: Secondary | ICD-10-CM | POA: Diagnosis not present

## 2014-04-08 DIAGNOSIS — F411 Generalized anxiety disorder: Secondary | ICD-10-CM | POA: Diagnosis not present

## 2014-04-08 DIAGNOSIS — Q211 Atrial septal defect: Secondary | ICD-10-CM | POA: Insufficient documentation

## 2014-04-08 DIAGNOSIS — Z885 Allergy status to narcotic agent status: Secondary | ICD-10-CM | POA: Diagnosis not present

## 2014-04-08 DIAGNOSIS — C50911 Malignant neoplasm of unspecified site of right female breast: Secondary | ICD-10-CM

## 2014-04-08 DIAGNOSIS — C773 Secondary and unspecified malignant neoplasm of axilla and upper limb lymph nodes: Secondary | ICD-10-CM | POA: Diagnosis not present

## 2014-04-08 DIAGNOSIS — Z9221 Personal history of antineoplastic chemotherapy: Secondary | ICD-10-CM | POA: Insufficient documentation

## 2014-04-08 HISTORY — DX: Presence of dental prosthetic device (complete) (partial): Z97.2

## 2014-04-08 HISTORY — DX: Chronic obstructive pulmonary disease, unspecified: J44.9

## 2014-04-08 HISTORY — PX: AXILLARY LYMPH NODE DISSECTION: SHX5229

## 2014-04-08 HISTORY — PX: BREAST LUMPECTOMY WITH RADIOACTIVE SEED LOCALIZATION: SHX6424

## 2014-04-08 SURGERY — BREAST LUMPECTOMY WITH RADIOACTIVE SEED LOCALIZATION
Anesthesia: General | Site: Breast | Laterality: Bilateral

## 2014-04-08 SURGERY — BREAST LUMPECTOMY WITH RADIOACTIVE SEED LOCALIZATION
Anesthesia: General | Laterality: Bilateral

## 2014-04-08 MED ORDER — BUPIVACAINE HCL (PF) 0.25 % IJ SOLN
INTRAMUSCULAR | Status: DC | PRN
  Administered 2014-04-08: 40 mL

## 2014-04-08 MED ORDER — FENTANYL CITRATE 0.05 MG/ML IJ SOLN
INTRAMUSCULAR | Status: DC | PRN
  Administered 2014-04-08: 25 ug via INTRAVENOUS
  Administered 2014-04-08: 100 ug via INTRAVENOUS
  Administered 2014-04-08: 50 ug via INTRAVENOUS
  Administered 2014-04-08: 25 ug via INTRAVENOUS

## 2014-04-08 MED ORDER — HYDROMORPHONE HCL PF 1 MG/ML IJ SOLN
0.2500 mg | INTRAMUSCULAR | Status: DC | PRN
  Administered 2014-04-08 (×4): 0.5 mg via INTRAVENOUS

## 2014-04-08 MED ORDER — MIDAZOLAM HCL 5 MG/5ML IJ SOLN
INTRAMUSCULAR | Status: DC | PRN
  Administered 2014-04-08: 2 mg via INTRAVENOUS

## 2014-04-08 MED ORDER — ONDANSETRON HCL 4 MG/2ML IJ SOLN
INTRAMUSCULAR | Status: DC | PRN
  Administered 2014-04-08: 4 mg via INTRAVENOUS

## 2014-04-08 MED ORDER — OXYCODONE-ACETAMINOPHEN 5-325 MG PO TABS: 1.0000 | ORAL_TABLET | ORAL | Status: DC | PRN

## 2014-04-08 MED ORDER — DIPHENHYDRAMINE HCL 50 MG/ML IJ SOLN
INTRAMUSCULAR | Status: DC | PRN
  Administered 2014-04-08: 10 mg via INTRAVENOUS

## 2014-04-08 MED ORDER — ONDANSETRON HCL 4 MG PO TABS: 4.0000 mg | ORAL_TABLET | Freq: Four times a day (QID) | ORAL | Status: DC | PRN

## 2014-04-08 MED ORDER — SUCCINYLCHOLINE CHLORIDE 20 MG/ML IJ SOLN
INTRAMUSCULAR | Status: AC
  Filled 2014-04-08: qty 1

## 2014-04-08 MED ORDER — OXYCODONE HCL 5 MG PO TABS
5.0000 mg | ORAL_TABLET | Freq: Once | ORAL | Status: AC | PRN
  Administered 2014-04-08: 5 mg via ORAL

## 2014-04-08 MED ORDER — ONDANSETRON HCL 4 MG/2ML IJ SOLN: 4.0000 mg | Freq: Four times a day (QID) | INTRAMUSCULAR | Status: DC | PRN

## 2014-04-08 MED ORDER — KCL IN DEXTROSE-NACL 20-5-0.9 MEQ/L-%-% IV SOLN: INTRAVENOUS | Status: DC

## 2014-04-08 MED ORDER — SCOPOLAMINE 1 MG/3DAYS TD PT72SCOPOLAMINE 1 MG/3DAYS
1.0000 | MEDICATED_PATCH | TRANSDERMAL | Status: DC
Start: 2014-04-08 — End: 2014-04-08
  Administered 2014-04-08: 1 via TRANSDERMAL

## 2014-04-08 MED ORDER — HYDROMORPHONE HCL PF 1 MG/ML IJ SOLN
INTRAMUSCULAR | Status: AC
  Filled 2014-04-08: qty 1

## 2014-04-08 MED ORDER — DEXAMETHASONE SODIUM PHOSPHATE 4 MG/ML IJ SOLN
INTRAMUSCULAR | Status: DC | PRN
  Administered 2014-04-08: 8 mg via INTRAVENOUS

## 2014-04-08 MED ORDER — MIDAZOLAM HCL 2 MG/2ML IJ SOLN
INTRAMUSCULAR | Status: AC
  Filled 2014-04-08: qty 2

## 2014-04-08 MED ORDER — OXYCODONE HCL 5 MG/5ML PO SOLN: 5.0000 mg | Freq: Once | ORAL | Status: AC | PRN

## 2014-04-08 MED ORDER — HEPARIN SODIUM (PORCINE) 5000 UNIT/ML IJ SOLN
5000.0000 [IU] | Freq: Three times a day (TID) | INTRAMUSCULAR | Status: DC
Start: 2014-04-09 — End: 2014-04-09

## 2014-04-08 MED ORDER — 0.9 % SODIUM CHLORIDE (POUR BTL) OPTIME
TOPICAL | Status: DC | PRN
  Administered 2014-04-08: 400 mL

## 2014-04-08 MED ORDER — CEFAZOLIN SODIUM-DEXTROSE 2-3 GM-% IV SOLR
2.0000 g | INTRAVENOUS | Status: AC
  Administered 2014-04-08: 2 g via INTRAVENOUS

## 2014-04-08 MED ORDER — OXYCODONE HCL 5 MG PO TABS
ORAL_TABLET | ORAL | Status: AC
  Filled 2014-04-08: qty 1

## 2014-04-08 MED ORDER — PROPOFOL 10 MG/ML IV BOLUS
INTRAVENOUS | Status: AC
  Filled 2014-04-08: qty 40

## 2014-04-08 MED ORDER — GABAPENTIN 100 MG PO CAPS
100.0000 mg | ORAL_CAPSULE | Freq: Two times a day (BID) | ORAL | Status: DC
  Administered 2014-04-08 (×2): 100 mg via ORAL

## 2014-04-08 MED ORDER — MORPHINE SULFATE 15 MG PO TABS: 15.0000 mg | ORAL_TABLET | Freq: Four times a day (QID) | ORAL | Status: DC | PRN

## 2014-04-08 MED ORDER — LORAZEPAM 1 MG PO TABS
1.0000 mg | ORAL_TABLET | Freq: Four times a day (QID) | ORAL | Status: DC | PRN
  Administered 2014-04-08: 1 mg via ORAL
  Filled 2014-04-08: qty 1

## 2014-04-08 MED ORDER — MIDAZOLAM HCL 2 MG/2ML IJ SOLN: 1.0000 mg | INTRAMUSCULAR | Status: DC | PRN

## 2014-04-08 MED ORDER — BUPIVACAINE HCL (PF) 0.25 % IJ SOLN
INTRAMUSCULAR | Status: AC
  Filled 2014-04-08: qty 30

## 2014-04-08 MED ORDER — LORATADINE 10 MG PO TABS: 10.0000 mg | ORAL_TABLET | Freq: Every day | ORAL | Status: DC | PRN

## 2014-04-08 MED ORDER — FENTANYL CITRATE 0.05 MG/ML IJ SOLN: 50.0000 ug | INTRAMUSCULAR | Status: DC | PRN

## 2014-04-08 MED ORDER — BUPIVACAINE-EPINEPHRINE (PF) 0.25% -1:200000 IJ SOLN
INTRAMUSCULAR | Status: AC
  Filled 2014-04-08: qty 30

## 2014-04-08 MED ORDER — SUCCINYLCHOLINE CHLORIDE 20 MG/ML IJ SOLN
INTRAMUSCULAR | Status: DC | PRN
  Administered 2014-04-08: 100 mg via INTRAVENOUS

## 2014-04-08 MED ORDER — CHLORHEXIDINE GLUCONATE 4 % EX LIQD: 1.0000 "application " | Freq: Once | CUTANEOUS | Status: DC

## 2014-04-08 MED ORDER — PROPOFOL 10 MG/ML IV BOLUS
INTRAVENOUS | Status: DC | PRN
  Administered 2014-04-08: 180 mg via INTRAVENOUS

## 2014-04-08 MED ORDER — PROMETHAZINE HCL 25 MG/ML IJ SOLN: 6.2500 mg | INTRAMUSCULAR | Status: DC | PRN

## 2014-04-08 MED ORDER — KCL IN DEXTROSE-NACL 20-5-0.45 MEQ/L-%-% IV SOLN
INTRAVENOUS | Status: DC
  Administered 2014-04-08: 15:00:00 via INTRAVENOUS
  Filled 2014-04-08: qty 1000

## 2014-04-08 MED ORDER — SCOPOLAMINE 1 MG/3DAYS TD PT72
MEDICATED_PATCH | TRANSDERMAL | Status: AC
  Filled 2014-04-08: qty 1

## 2014-04-08 MED ORDER — LACTATED RINGERS IV SOLN
INTRAVENOUS | Status: DC
  Administered 2014-04-08 (×2): via INTRAVENOUS
  Administered 2014-04-08: 10 mL/h via INTRAVENOUS

## 2014-04-08 MED ORDER — LIDOCAINE HCL (CARDIAC) 20 MG/ML IV SOLN
INTRAVENOUS | Status: DC | PRN
  Administered 2014-04-08: 100 mg via INTRAVENOUS

## 2014-04-08 MED ORDER — MORPHINE SULFATE 4 MG/ML IJ SOLN: 4.0000 mg | INTRAMUSCULAR | Status: DC | PRN

## 2014-04-08 MED ORDER — OXYCODONE-ACETAMINOPHEN 5-325 MG PO TABS
1.0000 | ORAL_TABLET | ORAL | Status: DC | PRN
  Administered 2014-04-08 – 2014-04-09 (×4): 1 via ORAL
  Filled 2014-04-08: qty 1
  Filled 2014-04-08: qty 2
  Filled 2014-04-08: qty 1

## 2014-04-08 MED ORDER — PHENYLEPHRINE HCL 10 MG/ML IJ SOLN
10.0000 mg | INTRAVENOUS | Status: DC | PRN
  Administered 2014-04-08: 50 ug/min via INTRAVENOUS

## 2014-04-08 MED ORDER — PROCHLORPERAZINE MALEATE 10 MG PO TABS: 10.0000 mg | ORAL_TABLET | Freq: Four times a day (QID) | ORAL | Status: DC | PRN

## 2014-04-08 MED ORDER — FENTANYL CITRATE 0.05 MG/ML IJ SOLN
INTRAMUSCULAR | Status: AC
  Filled 2014-04-08: qty 10

## 2014-04-08 SURGICAL SUPPLY — 62 items
ADH SKN CLS APL DERMABOND .7 (GAUZE/BANDAGES/DRESSINGS) ×12
APPLIER CLIP 9.375 MED OPEN (MISCELLANEOUS) ×10
APR CLP MED 9.3 20 MLT OPN (MISCELLANEOUS) ×6
BINDER BREAST LRG (GAUZE/BANDAGES/DRESSINGS) IMPLANT
BINDER BREAST MEDIUM (GAUZE/BANDAGES/DRESSINGS) IMPLANT
BINDER BREAST XLRG (GAUZE/BANDAGES/DRESSINGS) IMPLANT
BINDER BREAST XXLRG (GAUZE/BANDAGES/DRESSINGS) IMPLANT
BLADE SURG 15 STRL LF DISP TIS (BLADE) ×4 IMPLANT
BLADE SURG 15 STRL SS (BLADE) ×10
CANISTER SUC SOCK COL 7IN (MISCELLANEOUS) ×2 IMPLANT
CANISTER SUCT 1200ML W/VALVE (MISCELLANEOUS) ×5 IMPLANT
CHLORAPREP W/TINT 26ML (MISCELLANEOUS) ×5 IMPLANT
CLIP APPLIE 9.375 MED OPEN (MISCELLANEOUS) ×2 IMPLANT
CLIP TI WIDE RED SMALL 6 (CLIP) ×8 IMPLANT
COVER MAYO STAND STRL (DRAPES) ×8 IMPLANT
COVER PROBE W GEL 5X96 (DRAPES) ×5 IMPLANT
COVER TABLE BACK 60X90 (DRAPES) ×5 IMPLANT
DECANTER SPIKE VIAL GLASS SM (MISCELLANEOUS) IMPLANT
DERMABOND ADVANCED (GAUZE/BANDAGES/DRESSINGS) ×8
DERMABOND ADVANCED .7 DNX12 (GAUZE/BANDAGES/DRESSINGS) ×6 IMPLANT
DEVICE DUBIN W/COMP PLATE 8390 (MISCELLANEOUS) ×8 IMPLANT
DRAPE LAPAROSCOPIC ABDOMINAL (DRAPES) ×3 IMPLANT
DRAPE PED LAPAROTOMY (DRAPES) ×2 IMPLANT
DRAPE UTILITY XL STRL (DRAPES) ×5 IMPLANT
ELECT COATED BLADE 2.86 ST (ELECTRODE) ×5 IMPLANT
ELECT REM PT RETURN 9FT ADLT (ELECTROSURGICAL) ×5
ELECTRODE REM PT RTRN 9FT ADLT (ELECTROSURGICAL) ×3 IMPLANT
GLOVE BIO SURGEON STRL SZ7.5 (GLOVE) ×11 IMPLANT
GLOVE BIOGEL M 7.0 STRL (GLOVE) ×3 IMPLANT
GLOVE BIOGEL M STRL SZ7.5 (GLOVE) ×6 IMPLANT
GLOVE BIOGEL PI IND STRL 7.5 (GLOVE) ×1 IMPLANT
GLOVE BIOGEL PI IND STRL 8 (GLOVE) ×2 IMPLANT
GLOVE BIOGEL PI INDICATOR 7.5 (GLOVE) ×2
GLOVE BIOGEL PI INDICATOR 8 (GLOVE) ×4
GLOVE EXAM NITRILE MD LF STRL (GLOVE) ×3 IMPLANT
GOWN STRL REUS W/ TWL LRG LVL3 (GOWN DISPOSABLE) ×6 IMPLANT
GOWN STRL REUS W/ TWL XL LVL3 (GOWN DISPOSABLE) ×3 IMPLANT
GOWN STRL REUS W/TWL LRG LVL3 (GOWN DISPOSABLE) ×10
GOWN STRL REUS W/TWL XL LVL3 (GOWN DISPOSABLE) ×15
KIT MARKER MARGIN INK (KITS) ×5 IMPLANT
NDL HYPO 25X1 1.5 SAFETY (NEEDLE) ×2 IMPLANT
NDL SAFETY ECLIPSE 18X1.5 (NEEDLE) ×3 IMPLANT
NEEDLE HYPO 18GX1.5 SHARP (NEEDLE) ×5
NEEDLE HYPO 25X1 1.5 SAFETY (NEEDLE) ×5 IMPLANT
NS IRRIG 1000ML POUR BTL (IV SOLUTION) ×5 IMPLANT
PACK BASIN DAY SURGERY FS (CUSTOM PROCEDURE TRAY) ×5 IMPLANT
PENCIL BUTTON HOLSTER BLD 10FT (ELECTRODE) ×5 IMPLANT
PIN SAFETY STERILE (MISCELLANEOUS) ×3 IMPLANT
SLEEVE SCD COMPRESS KNEE MED (MISCELLANEOUS) ×5 IMPLANT
SPONGE GAUZE 4X4 12PLY STER LF (GAUZE/BANDAGES/DRESSINGS) ×6 IMPLANT
SPONGE LAP 18X18 X RAY DECT (DISPOSABLE) ×5 IMPLANT
SUT ETHILON 2 0 FS 18 (SUTURE) ×3 IMPLANT
SUT ETHILON 3 0 PS 1 (SUTURE) ×3 IMPLANT
SUT MON AB 4-0 PC3 18 (SUTURE) ×14 IMPLANT
SUT SILK 2 0 SH (SUTURE) IMPLANT
SUT VICRYL 3-0 CR8 SH (SUTURE) ×5 IMPLANT
SYR CONTROL 10ML LL (SYRINGE) ×5 IMPLANT
TOWEL OR 17X24 6PK STRL BLUE (TOWEL DISPOSABLE) ×5 IMPLANT
TOWEL OR NON WOVEN STRL DISP B (DISPOSABLE) ×5 IMPLANT
TUBE CONNECTING 20'X1/4 (TUBING) ×1
TUBE CONNECTING 20X1/4 (TUBING) ×4 IMPLANT
YANKAUER SUCT BULB TIP NO VENT (SUCTIONS) ×5 IMPLANT

## 2014-04-08 NOTE — Transfer of Care (Signed)
Immediate Anesthesia Transfer of Care Note  Patient: Paula Navarro  Procedure(s) Performed: Procedure(s): BILATERAL  RADIOACTIVE SEED LOCALIZATION LUMPECTOMY  (Bilateral)  BILATERAL AXILLARY LYMPH NODE DISSECTION (Bilateral)  Patient Location: PACU  Anesthesia Type:General  Level of Consciousness: awake, sedated and responds to stimulation  Airway & Oxygen Therapy: Patient Spontanous Breathing and Patient connected to face mask oxygen  Post-op Assessment: Report given to PACU RN, Post -op Vital signs reviewed and stable and Patient moving all extremities  Post vital signs: Reviewed and stable  Complications: No apparent anesthesia complications

## 2014-04-08 NOTE — Anesthesia Postprocedure Evaluation (Signed)
Anesthesia Post Note  Patient: Paula Navarro  Procedure(s) Performed: Procedure(s) (LRB): BILATERAL  RADIOACTIVE SEED LOCALIZATION LUMPECTOMY  (Bilateral)  BILATERAL AXILLARY LYMPH NODE DISSECTION (Bilateral)  Anesthesia type: General  Patient location: PACU  Post pain: Pain level controlled and Adequate analgesia  Post assessment: Post-op Vital signs reviewed, Patient's Cardiovascular Status Stable, Respiratory Function Stable, Patent Airway and Pain level controlled  Last Vitals:  Filed Vitals:   04/08/14 1330  BP: 142/82  Pulse: 81  Temp:   Resp: 18    Post vital signs: Reviewed and stable  Level of consciousness: awake, alert  and oriented  Complications: No apparent anesthesia complications

## 2014-04-08 NOTE — Anesthesia Preprocedure Evaluation (Addendum)
Anesthesia Evaluation  Patient identified by MRN, date of birth, ID band Patient awake    Reviewed: Allergy & Precautions, H&P , NPO status , Patient's Chart, lab work & pertinent test results  History of Anesthesia Complications Negative for: history of anesthetic complications  Airway Mallampati: II TM Distance: >3 FB Neck ROM: Full    Dental  (+) Edentulous Upper, Edentulous Lower, Dental Advisory Given   Pulmonary COPD COPD inhaler, former smoker,    Pulmonary exam normal       Cardiovascular negative cardio ROS   S/P VSD repair   Neuro/Psych Anxiety negative neurological ROS     GI/Hepatic negative GI ROS, Neg liver ROS,   Endo/Other  negative endocrine ROS  Renal/GU negative Renal ROS     Musculoskeletal   Abdominal   Peds  Hematology   Anesthesia Other Findings   Reproductive/Obstetrics                          Anesthesia Physical Anesthesia Plan  ASA: III  Anesthesia Plan: General   Post-op Pain Management:    Induction:   Airway Management Planned: LMA and Oral ETT  Additional Equipment:   Intra-op Plan:   Post-operative Plan: Extubation in OR  Informed Consent: I have reviewed the patients History and Physical, chart, labs and discussed the procedure including the risks, benefits and alternatives for the proposed anesthesia with the patient or authorized representative who has indicated his/her understanding and acceptance.   Dental advisory given  Plan Discussed with: CRNA, Anesthesiologist and Surgeon  Anesthesia Plan Comments:         Anesthesia Quick Evaluation

## 2014-04-08 NOTE — Anesthesia Procedure Notes (Signed)
Procedure Name: Intubation Date/Time: 04/08/2014 9:11 AM Performed by: Baxter Flattery Pre-anesthesia Checklist: Patient identified, Emergency Drugs available, Suction available and Patient being monitored Patient Re-evaluated:Patient Re-evaluated prior to inductionOxygen Delivery Method: Circle System Utilized Preoxygenation: Pre-oxygenation with 100% oxygen Intubation Type: IV induction Ventilation: Mask ventilation without difficulty Laryngoscope Size: Miller and 2 Grade View: Grade I Tube type: Oral Tube size: 7.0 mm Number of attempts: 1 Airway Equipment and Method: stylet Placement Confirmation: ETT inserted through vocal cords under direct vision,  positive ETCO2 and breath sounds checked- equal and bilateral Secured at: 21 cm Tube secured with: Tape Dental Injury: Teeth and Oropharynx as per pre-operative assessment

## 2014-04-08 NOTE — H&P (View-Only) (Signed)
Patient ID: Paula Navarro, female   DOB: October 26, 1950, 63 y.o.   MRN: [id number]  Chief Complaint  Patient presents with  . Breast Cancer Long Term Follow Up    HPI Paula Navarro is a 63 y.o. female.  The patient is a 63 year old black female who we saw originally in January. 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 She has been receiving neoadjuvant chemotherapy and has had a very good response. She returns today to schedule surgery. She states that she does not want to have mastectomies but would be willing to have breast conservation surgery. Her most recent MRI showed the size of these areas both to be approximately 2 cm. The enlarged lymph nodes bilaterally have resolved.  HPI  Past Medical History  Diagnosis Date  . Chronic bronchitis   . Palpitation     Tachycardia reported by monitor clerk during a symptomatic spell  . Chest pain     Admitted to [facility] in 09/2011; refused stress test  . Atrial septal defect 1996    Surgical repair in 1996  . Tobacco abuse     60 pack years; 1.5 packs per day  . Anxiety   . Anemia   . Breast cancer     Past Surgical History  Procedure Laterality Date  . Cholecystectomy    . Cesarean section    . Vein bypass surgery    . Breast biopsy Bilateral     Family History  Problem Relation Age of Onset  . Breast cancer Maternal Aunt     Social History History  Substance Use Topics  . Smoking status: Former Smoker -- 1.50 packs/day for 40 years    Types: Cigarettes  . Smokeless tobacco: Never Used  . Alcohol Use: No    Allergies  Allergen Reactions  . Codeine Other (See Comments)    Hot in chest    Current Outpatient Prescriptions  Medication Sig Dispense Refill  . LORazepam (ATIVAN) 1 MG tablet Take 1 tablet (1 mg total) by mouth every 6 (six) hours as needed for anxiety.  30 tablet  1  . pregabalin (LYRICA) 50 MG capsule Take 1 capsule (50 mg total) by mouth 2 (two) times daily as needed.   14 capsule  0  . prochlorperazine (COMPAZINE) 10 MG tablet Take 1 tablet (10 mg total) by mouth every 6 (six) hours as needed for nausea or vomiting.  30 tablet  1  . B Complex-C (SUPER B COMPLEX PO) Take 1 tablet by mouth daily.      . clobetasol cream (TEMOVATE) 2.37 % Apply 1 application topically 2 (two) times daily. Apply to hands and feet twice a day as needed.  30 g  1  . ferrous sulfate 325 (65 FE) MG EC tablet Take 1 tablet (325 mg total) by mouth 3 (three) times daily with meals.  30 tablet  3  . lidocaine-prilocaine (EMLA) cream Apply 1 application topically as needed.  30 g  1  . naproxen (NAPROSYN) 375 MG tablet Take 1 tablet (375 mg total) by mouth 2 (two) times daily.  14 tablet  0  . nystatin (MYCOSTATIN) 100000 UNIT/ML suspension Take 5 mLs (500,000 Units total) by mouth 4 (four) times daily. X 1 week.  240 mL  0  . oxyCODONE-acetaminophen (PERCOCET/ROXICET) 5-325 MG per tablet Take 1 tablet by mouth every 6 (six) hours as needed for severe pain.  20 tablet  0   No current facility-administered  medications for this visit.    Review of Systems Review of Systems  Constitutional: Negative.   HENT: Negative.   Eyes: Negative.   Respiratory: Negative.   Cardiovascular: Negative.   Gastrointestinal: Negative.   Endocrine: Negative.   Genitourinary: Negative.   Musculoskeletal: Negative.   Skin: Negative.   Allergic/Immunologic: Negative.   Neurological: Negative.   Hematological: Negative.   Psychiatric/Behavioral: Negative.     Blood pressure 132/72, pulse 87, temperature 98.3 F (36.8 C), height 5\' 2"  (1.575 m), weight 121 lb (54.885 kg), last menstrual period 05/20/2011.  Physical Exam Physical Exam  Constitutional: She is oriented to person, place, and time. She appears well-developed and well-nourished.  HENT:  Head: Normocephalic and atraumatic.  Eyes: Conjunctivae and EOM are normal. Pupils are equal, round, and reactive to light.  Neck: Normal range of  motion. Neck supple.  Cardiovascular: Normal rate, regular rhythm and normal heart sounds.   Pulmonary/Chest: Effort normal and breath sounds normal.  There is still a palpable mass in the 12:00 position of the left breast measuring approximately 2 cm. There is no palpable mass in the right breast. There is no palpable axillary, supraclavicular, or cervical lymphadenopathy.  Abdominal: Soft. Bowel sounds are normal.  Musculoskeletal: Normal range of motion.  Lymphadenopathy:    She has no cervical adenopathy.  Neurological: She is alert and oriented to person, place, and time.  Skin: Skin is warm and dry.  Psychiatric: She has a normal mood and affect. Her behavior is normal.    Data Reviewed As above  Assessment    At this point the patient would prefer bilateral breast conservation surgery. She may also be a candidate to go on the study for bilateral sentinel node mapping now that she has had neoadjuvant therapy and her lymphadenopathy has resolved. I will ask the study coordinator at the cancer center to contact her and let us know if she is a candidate. If she is not then she will need bilateral axillary lymph node dissections. I've discussed with her in detail the risks and benefits of the operation to do this as well as some of the technical aspects and she understands and wishes to proceed. She also realizes that if she has positive margins after this that she may then need a mastectomy and she is agreeable to this.     Plan    Plan for bilateral radioactive seed localized lumpectomy with sentinel node mapping if possible on study protocol and has not been bilateral axillary lymph node dissections        [name],Paula Navarro 03/10/2014, 12:37 PM

## 2014-04-08 NOTE — Op Note (Signed)
04/08/2014  11:58 AM  PATIENT:  Paula Navarro  63 y.o. female  PRE-OPERATIVE DIAGNOSIS:  Bilateral Breast Cancer  POST-OPERATIVE DIAGNOSIS:  Bilateral Breast Cancer  PROCEDURE:  Procedure(s): BILATERAL  RADIOACTIVE SEED LOCALIZATION LUMPECTOMY  (Bilateral)  BILATERAL AXILLARY LYMPH NODE DISSECTION (Bilateral)  SURGEON:  Surgeon(s) and Role:    * Jovita Kussmaul, MD - Primary  PHYSICIAN ASSISTANT:   ASSISTANTS: none   ANESTHESIA:   general  EBL:  Total I/O In: 1800 [I.V.:1800] Out: -   BLOOD ADMINISTERED:none  DRAINS: (2) Jackson-Pratt drain(s) with closed bulb suction in the axillas   LOCAL MEDICATIONS USED:  MARCAINE     SPECIMEN:  Source of Specimen:  bilateral breast tissue and bilateral axillary contents  DISPOSITION OF SPECIMEN:  PATHOLOGY  COUNTS:  YES  TOURNIQUET:  * No tourniquets in log *  DICTATION: .Dragon Dictation After informed consent was obtained the patient was brought to the operating room and placed in the supine position on the operating room table. After adequate induction of general anesthesia the patient's bilateral chest, breast, and axillary areas were prepped with ChloraPrep, allowed to dry, and draped in usual sterile manner. Previously a radioactive seed was placed at the site of the tumor in both breasts. Attention was first turned to the left breast. The neoprobe set for I-125 was used to identify the hot spot in the left breast. A curvilinear incision was made overlying this hot spot in the upper left breast with a 15 blade knife. This incision was carried through the skin and subcutaneous tissue sharply with electrocautery. Using the neoprobe to direct the dissection we were able to excise a circular portion of breast tissue around the radioactive seed. This dissection was carried all the way to the chest wall. Once the specimen was removed it was oriented with the appropriate paint colors and the seed was confirmed in the specimen with no  radioactivity left on the chest wall. A specimen radiograph showed the clip and seed to be in the center of the specimen. The specimen was then sent to pathology for further evaluation. Hemostasis was achieved using the Bovie electrocautery. The wound was irrigated with copious amounts of saline and infiltrated with Quarter percent Marcaine. The deep layer the wound was then closed with interrupted 3-0 Vicryl stitches. The skin was then closed with a running 4-0 Monocryl subcuticular stitch. Attention was then turned to the left axilla. A transversely oriented incision was made across the lower axilla with a 15 blade knife. This incision was carried through the skin and subcutaneous tissue sharply with the electrocautery until the axilla was entered. Once the axilla was entered then with blunt dissection we were able to identify the serratus muscle medially, the latissimus muscle laterally, and the axillary vein superiorly. The contents of the axilla with in these boundaries was then bluntly dissected free with a right angle clamp. Several small vessels were controlled with clips. The long thoracic and thoracic dorsal neurovascular complexes were identified and spared. Once the tissue from the axilla was completely removed it was sent to pathology for further evaluation. The area was examined and found to be hemostatic. The wound was irrigated with copious amounts of saline. The deep layer of the wound was then closed with interrupted 3-0 Vicryl stitches. The wound was infiltrated with quarter percent Marcaine. The skin was closed with a running 4-0 Monocryl subcuticular stitch. Attention was then turned to the right breast. Again using the neoprobe we identified a hot spot laterally  in the right breast. An incision was made with a 15 blade knife overlying this hot spot. The incision was carried through the skin and subcutaneous tissue sharply with the electrocautery. The dissection was then carried around the hot  spot using the neoprobe to direct the dissection. The dissection was carried all the way to the chest wall. Once the specimen was removed it was confirmed that the seed was in the specimen and not in the breast. The specimen was then oriented with the appropriate paint colors. A specimen radiograph was obtained that showed the clip and seen to be in the center of the specimen. The specimen was then sent to pathology for further evaluation. The wound was irrigated with copious amounts of saline and infiltrated with quarter percent Marcaine. The deep layer the wound is then closed with interrupted 3-0 Vicryl stitches. The skin was then closed with a running 4-0 Monocryl subcuticular stitch. Attention was then turned to the right axilla. A transversely oriented incision was made across the lower axilla with a 15 blade knife. This incision was carried through the skin and subcutaneous tissue sharply with the electrocautery until the axilla was entered. Once the axilla was entered and blunt dissection was able to identify the serratus muscle medially, the latissimus muscle laterally, and the axillary vein superiorly, the contents of the axilla within these boundaries was then bluntly separated with a right angle clamp. Several small vessels were controlled with clips. The long thoracic and thoracic dorsal neurovascular complexes were identified and spared. Once the specimen was removed it was sent to pathology as right axillary contents for further evaluation. The wound was examined and found to be hemostatic. The wound was irrigated with copious amounts of saline and infiltrated with quarter percent Marcaine. Prior to closing the left axilla a small stab incision was made the niece the breast. A tonsil clamp was placed through this opening into the operative bed and used to bring a 19 [place] round Blake drain in the operative bed. The drain was anchored to the skin with a 3-0 nylon stitch. The drain once the wound was  closed was placed to bulb suction and there was a good seal. At this point a similar stab incision was made beneath the breast on the right side. A tonsil clamp was placed through this opening into the operative bed and used to bring a 19 [place] round Blake drain into the operative bed. The drain was anchored to the skin with a 3-0 nylon stitch. The deep layer the wound was then closed with interrupted 3-0 Vicryl stitches. The skin was then closed with a running 4-0 Monocryl subcuticular stitch. Dermabond dressings were then applied all the incisions. The patient tolerated the procedure well. At the end of the case all needle sponge and instrument counts were correct. The patient was then awakened and taken to recovery in stable condition.  PLAN OF CARE: Admit for overnight observation  PATIENT DISPOSITION:  PACU - hemodynamically stable.   Delay start of Pharmacological VTE agent (>24hrs) due to surgical blood loss or risk of bleeding: no

## 2014-04-08 NOTE — Interval H&P Note (Signed)
History and Physical Interval Note:  04/08/2014 8:20 AM  Paula Navarro  has presented today for surgery, with the diagnosis of bilateral breast cancer  The various methods of treatment have been discussed with the patient and family. After consideration of risks, benefits and other options for treatment, the patient has consented to  Procedure(s): BILATERAL  RADIOACTIVE SEED LOCALIZATION LUMPECTOMY AND AXILLARY LYMPH NODE DISSECTION (Bilateral) as a surgical intervention .  The patient's history has been reviewed, patient examined, no change in status, stable for surgery.  I have reviewed the patient's chart and labs.  Questions were answered to the patient's satisfaction.     [name],[name]

## 2014-04-09 ENCOUNTER — Telehealth: Payer: Self-pay | Admitting: Hematology and Oncology

## 2014-04-09 ENCOUNTER — Encounter (HOSPITAL_BASED_OUTPATIENT_CLINIC_OR_DEPARTMENT_OTHER): Payer: Self-pay | Admitting: General Surgery

## 2014-04-09 DIAGNOSIS — C50919 Malignant neoplasm of unspecified site of unspecified female breast: Secondary | ICD-10-CM | POA: Diagnosis not present

## 2014-04-09 NOTE — Telephone Encounter (Signed)
returned pt call....no vm

## 2014-04-09 NOTE — Discharge Instructions (Signed)
No showers until drains are out. No driving until you are no longer taking narcotic pain medication and feel you can maneuver your car safely.  Keep a log of your drain amounts and bring these in to see Dr. Marlou Starks   About my Jackson-Pratt Bulb Drain  What is a Jackson-Pratt bulb? A Jackson-Pratt is a soft, round device used to collect drainage. It is connected to a long, thin drainage catheter, which is held in place by one or two small stiches near your surgical incision site. When the bulb is squeezed, it forms a vacuum, forcing the drainage to empty into the bulb.  Emptying the Jackson-Pratt bulb- To empty the bulb: 1. Release the plug on the top of the bulb. 2. Pour the bulb's contents into a measuring container which your nurse will provide. 3. Record the time emptied and amount of drainage. Empty the drain(s) as often as your     doctor or nurse recommends.  Date                  Time                    Amount (Drain 1)                 Amount (Drain 2)  _____________________________________________________________________  _____________________________________________________________________  _____________________________________________________________________  _____________________________________________________________________  _____________________________________________________________________  _____________________________________________________________________  _____________________________________________________________________  _____________________________________________________________________  Squeezing the Jackson-Pratt Bulb- To squeeze the bulb: 1. Make sure the plug at the top of the bulb is open. 2. Squeeze the bulb tightly in your fist. You will hear air squeezing from the bulb. 3. Replace the plug while the bulb is squeezed. 4. Use a safety pin to attach the bulb to your clothing. This will keep the catheter from     pulling at the bulb insertion  site.  When to call your doctor- Call your doctor if:  Drain site becomes red, swollen or hot.  You have a fever greater than 101 degrees F.  There is oozing at the drain site.  Drain falls out (apply a guaze bandage over the drain hole and secure it with tape).  Drainage increases daily not related to activity patterns. (You will usually have more drainage when you are active than when you are resting.)  Drainage has a bad odor.

## 2014-04-14 ENCOUNTER — Other Ambulatory Visit: Payer: Self-pay | Admitting: *Deleted

## 2014-04-14 DIAGNOSIS — C50912 Malignant neoplasm of unspecified site of left female breast: Principal | ICD-10-CM

## 2014-04-14 DIAGNOSIS — C50911 Malignant neoplasm of unspecified site of right female breast: Secondary | ICD-10-CM

## 2014-04-14 MED ORDER — LORAZEPAM 1 MG PO TABS: 1.0000 mg | ORAL_TABLET | Freq: Four times a day (QID) | ORAL | Status: DC | PRN

## 2014-04-15 NOTE — Telephone Encounter (Signed)
Called pharmacy automated line and gave refill permission of #[phone] with no additional as was left on pharmacy automated line on 04/14/14 at 1535. Requested they f/u with calls from yesterday.

## 2014-04-17 ENCOUNTER — Telehealth: Payer: Self-pay | Admitting: Hematology and Oncology

## 2014-04-17 ENCOUNTER — Ambulatory Visit: Payer: Medicare Other | Admitting: Hematology and Oncology

## 2014-04-17 NOTE — Telephone Encounter (Signed)
, °

## 2014-04-20 ENCOUNTER — Encounter (HOSPITAL_COMMUNITY): Payer: Self-pay | Admitting: Emergency Medicine

## 2014-04-20 ENCOUNTER — Emergency Department (HOSPITAL_COMMUNITY): Payer: Medicare Other

## 2014-04-20 ENCOUNTER — Emergency Department (HOSPITAL_COMMUNITY)
Admission: EM | Admit: 2014-04-20 | Discharge: 2014-04-20 | Disposition: A | Discharge status: Home / Self Care | Payer: Medicare Other | Attending: Emergency Medicine | Admitting: Emergency Medicine

## 2014-04-20 DIAGNOSIS — J449 Chronic obstructive pulmonary disease, unspecified: Secondary | ICD-10-CM | POA: Diagnosis not present

## 2014-04-20 DIAGNOSIS — Z853 Personal history of malignant neoplasm of breast: Secondary | ICD-10-CM | POA: Insufficient documentation

## 2014-04-20 DIAGNOSIS — F411 Generalized anxiety disorder: Secondary | ICD-10-CM | POA: Insufficient documentation

## 2014-04-20 DIAGNOSIS — G8929 Other chronic pain: Secondary | ICD-10-CM | POA: Insufficient documentation

## 2014-04-20 DIAGNOSIS — J4489 Other specified chronic obstructive pulmonary disease: Secondary | ICD-10-CM | POA: Insufficient documentation

## 2014-04-20 DIAGNOSIS — Z8774 Personal history of (corrected) congenital malformations of heart and circulatory system: Secondary | ICD-10-CM | POA: Insufficient documentation

## 2014-04-20 DIAGNOSIS — Z9010 Acquired absence of unspecified breast and nipple: Secondary | ICD-10-CM | POA: Insufficient documentation

## 2014-04-20 DIAGNOSIS — Z79899 Other long term (current) drug therapy: Secondary | ICD-10-CM | POA: Diagnosis not present

## 2014-04-20 DIAGNOSIS — K5900 Constipation, unspecified: Secondary | ICD-10-CM | POA: Diagnosis not present

## 2014-04-20 DIAGNOSIS — R300 Dysuria: Secondary | ICD-10-CM | POA: Diagnosis present

## 2014-04-20 DIAGNOSIS — Z98811 Dental restoration status: Secondary | ICD-10-CM | POA: Insufficient documentation

## 2014-04-20 DIAGNOSIS — Z87891 Personal history of nicotine dependence: Secondary | ICD-10-CM | POA: Insufficient documentation

## 2014-04-20 LAB — URINALYSIS, ROUTINE W REFLEX MICROSCOPIC
BILIRUBIN URINE: NEGATIVE
Glucose, UA: NEGATIVE mg/dL
Hgb urine dipstick: NEGATIVE
Ketones, ur: NEGATIVE mg/dL
NITRITE: NEGATIVE
PH: 6 (ref 5.0–8.0)
Protein, ur: NEGATIVE mg/dL
Urobilinogen, UA: 0.2 mg/dL (ref 0.0–1.0)

## 2014-04-20 LAB — URINE MICROSCOPIC-ADD ON

## 2014-04-20 MED ORDER — FLEET ENEMA 7-19 GM/118ML RE ENEM
1.0000 | ENEMA | Freq: Once | RECTAL | Status: AC
  Administered 2014-04-20: 1 via RECTAL

## 2014-04-20 NOTE — ED Notes (Signed)
Patient states "I have been to the bathroom twice but all I did was pee." Advised Glendell Docker NP.

## 2014-04-20 NOTE — ED Notes (Signed)
Breast/arm pain from bilateral lumpectomy. Also states constipation and currently taking oxycodone. NAD

## 2014-04-20 NOTE — ED Notes (Signed)
Patient stated enema "immediately came out. I passed a little bit of stool and I can feel that it has dropped down. I want to try another enema." Advised Glendell Docker NP. Verbal order for another fleet enema.

## 2014-04-20 NOTE — ED Provider Notes (Signed)
CSN: [id number]     Arrival date & time 04/20/14  1062 History   First MD Initiated Contact with Patient 04/20/14 [phone]     Chief Complaint  Patient presents with  . Pain     (Consider location/radiation/quality/duration/timing/severity/associated sxs/prior Treatment) HPI Comments: Pt states that she had bilateral lumpectomy on 9/9. Pt states that she has been taking oxycodone for pain and she has had a hard time having a bowel movement in the last couple of days. Pt states that she took some miralax yesterday and didn't get much relief. Denies fever, vomiting. States that she a little pain when she urinates now because she feels the need to have bm  The history is provided by the patient. No language interpreter was used.    Past Medical History  Diagnosis Date  . Chronic bronchitis   . Palpitation     Tachycardia reported by monitor clerk during a symptomatic spell  . Chest pain     Admitted to [facility] in 09/2011; refused stress test  . Atrial septal defect 1996    Surgical repair in 1996  . Tobacco abuse     60 pack years; 1.5 packs per day  . Anxiety   . Anemia   . Breast cancer   . Wears dentures     top  . COPD (chronic obstructive pulmonary disease)     on xray  . Chronic pain    Past Surgical History  Procedure Laterality Date  . Cholecystectomy    . Cesarean section      x3  . Breast biopsy Bilateral   . Tubal ligation    . Asd repair, ostium primum  1996    dr Roxy Horseman  . Port a cath revision  1/15    put in   . Breast lumpectomy with radioactive seed localization Bilateral 04/08/2014    Procedure: BILATERAL  RADIOACTIVE SEED LOCALIZATION LUMPECTOMY ;  Surgeon: Autumn Messing III, MD;  Location: [facility];  Service: General;  Laterality: Bilateral;  . Axillary lymph node dissection Bilateral 04/08/2014    Procedure:  BILATERAL AXILLARY LYMPH NODE DISSECTION;  Surgeon: Autumn Messing III, MD;  Location: [facility];  Service: General;  Laterality:  Bilateral;   Family History  Problem Relation Age of Onset  . Breast cancer Maternal Aunt    History  Substance Use Topics  . Smoking status: Former Smoker -- 1.50 packs/day for 40 years    Types: Cigarettes    Quit date: 08/01/2013  . Smokeless tobacco: Never Used  . Alcohol Use: No   OB History   Grav Para Term Preterm Abortions TAB SAB Ect Mult Living                 Review of Systems  Constitutional: Negative.   Respiratory: Negative.   Cardiovascular: Negative.       Allergies  Codeine  Home Medications   Prior to Admission medications   Medication Sig Start Date End Date Taking? Authorizing Provider  B Complex-C (SUPER B COMPLEX PO) Take 1 tablet by mouth daily.    Historical Provider, MD  clobetasol cream (TEMOVATE) 5.46 % Apply 1 application topically 2 (two) times daily. Apply to hands and feet twice a day as needed. 03/30/14   Rulon Eisenmenger, MD  diphenhydrAMINE (SOMINEX) 25 MG tablet Take 25 mg by mouth at bedtime as needed for sleep.    Historical Provider, MD  gabapentin (NEURONTIN) 300 MG capsule TAKE 1 CAPSULE (300 MG  TOTAL) BY MOUTH 3 (THREE) TIMES DAILY. 04/01/14   Chauncey Cruel, MD  lidocaine-prilocaine (EMLA) cream Apply 1 application topically as needed. 01/20/14   Maryanna Shape, NP  loratadine (CLARITIN) 10 MG tablet Take 10 mg by mouth daily as needed for itching.    Historical Provider, MD  LORazepam (ATIVAN) 1 MG tablet Take 1 tablet (1 mg total) by mouth every 6 (six) hours as needed for anxiety. 04/14/14   Rulon Eisenmenger, MD  morphine (MSIR) 15 MG tablet Take 1 tablet (15 mg total) by mouth every 6 (six) hours as needed for severe pain. 04/02/14   Rulon Eisenmenger, MD  oxyCODONE-acetaminophen (PERCOCET/ROXICET) 5-325 MG per tablet 1 or 2 tabs PO q6h prn pain 04/06/14   Francine Graven, DO  oxyCODONE-acetaminophen (ROXICET) 5-325 MG per tablet Take 1-2 tablets by mouth every 4 (four) hours as needed. 04/08/14   Autumn Messing III, MD  prochlorperazine  (COMPAZINE) 10 MG tablet Take 1 tablet (10 mg total) by mouth every 6 (six) hours as needed for nausea or vomiting. 02/02/14   Minette Headland, NP   BP 119/87  Pulse 86  Temp(Src) 98.7 F (37.1 C) (Oral)  Resp 16  Ht 5\' 2"  (1.575 m)  Wt 125 lb (56.7 kg)  BMI 22.86 kg/m2  SpO2 95%  LMP 05/20/2011 Physical Exam  Nursing note and vitals reviewed. Constitutional: She is oriented to person, place, and time. She appears well-developed and well-nourished.  Cardiovascular: Normal rate and regular rhythm.   Pulmonary/Chest: Effort normal and breath sounds normal.  Abdominal: Soft. Bowel sounds are normal. There is no tenderness.  Genitourinary:  Large amount of soft stool in the rectum  Musculoskeletal: Normal range of motion.  Neurological: She is alert and oriented to person, place, and time.  Skin: Skin is warm and dry.  Psychiatric: She has a normal mood and affect.    ED Course  Procedures (including critical care time) Labs Review Labs Reviewed  URINALYSIS, ROUTINE W REFLEX MICROSCOPIC - Abnormal; Notable for the following:    Specific Gravity, Urine <1.005 (*)    Leukocytes, UA TRACE (*)    All other components within normal limits  URINE MICROSCOPIC-ADD ON - Abnormal; Notable for the following:    Squamous Epithelial / LPF MANY (*)    All other components within normal limits  URINE CULTURE       Specific Gravity, Urine <1.005 (*)    Leukocytes, UA TRACE (*)    All other components within normal limits  URINE MICROSCOPIC-ADD ON - Abnormal; Notable for the following:    Squamous Epithelial / LPF MANY (*)    All other components within normal limits  URINE CULTURE    Imaging Review No results found.   EKG Interpretation None      MDM   Final diagnoses:  Constipation, unspecified constipation type    8:30 AM Stool removed. Pt sent for x-ray post stool removal. Will attempt for pt to have a bm here prior to leaving 11:41 AM Pt has had a small bowel  movement  But is more comfortable at this time. Discussed with her taking the miralax and water and fiber intake. Discussed return as needed.   Glendell Docker, NP 04/20/14 1141

## 2014-04-20 NOTE — Discharge Instructions (Signed)
Take the miralax as discussed.  Preventing Constipation After Surgery Constipation is when a person has fewer than 3 bowel movements a week; has difficulty having a bowel movement; or has stools that are dry, hard, or larger than normal. Many things can make constipation likely after surgery. They include:  Medicines, especially numbing medicines (anesthetics) and very strong pain medicines called narcotics.  Feeling stressed because of the surgery.  Eating different foods than normal.  Being less active. Symptoms of constipation include:  Having fewer than 3 bowel movements a week.  Straining to have a bowel movement.  Having hard, dry, or larger-than-normal stools.  Feeling full or bloated.  Having pain in the lower abdomen.  Not feeling relief after having a bowel movement. HOME CARE INSTRUCTIONS  Diet  Eat foods that have a lot of fiber. These include fruits, vegetables, whole grains, and beans. Limit foods high in fat and processed sugars. These include french fries, hamburgers, cookies, and candy.  Take a fiber supplement as directed. If you are not taking a fiber supplement and think that you are not getting enough fiber from foods, talk to your health care provider about adding a fiber supplement to your diet.  Drink clear fluids, especially water. Avoid drinking alcohol, caffeine, and soda. These can make constipation worse.  Drink enough fluids to keep your urine clear or pale yellow. Activity   After surgery, return to your normal activities slowly or when your health care provider says it is okay.  Start walking as soon as you can. Try to go a little farther each day.  Once your health care provider approves, do some sort of regular exercise. This helps prevent constipation. Bowel Movements  Go to the restroom when you have the urge to go. Do not hold it in.  Try drinking something hot to get a bowel movement started.  Keep track of how often you use the  restroom. If you miss 2-3 bowel movements, talk to your health care provider about medicines that prevent constipation. Your health care provider may suggest a stool softener, laxative, or fiber supplement.  Only take over-the-counter or prescription medicines as directed by your health care provider.  Do not take other medicines without talking to your health care provider first. If you become constipated and take a medicine to make you have a bowel movement, the problem may get worse. Other kinds of medicine can also make the problem worse. SEEK MEDICAL CARE IF:  You used stool softeners or laxatives and still have not had a bowel movement within 24-48 hours after using them.  You have not had a bowel movement in 3 days. SEEK IMMEDIATE MEDICAL CARE IF:   Your constipation lasts for more than 4 days or gets worse.  You have bright red blood in your stool.  You have abdominal or rectal pain.  You have very bad cramping.  You have thin, pencil-like stools.  You have unexplained weight loss.  You have a fever or persistent symptoms for more than 2-3 days.  You have a fever and your symptoms suddenly get worse. Document Released: 11/11/2012 Document Revised: 12/01/2013 Document Reviewed: 11/11/2012 [facility] Patient Information 2015 [place], [place]. This information is not intended to replace advice given to you by your health care provider. Make sure you discuss any questions you have with your health care provider. Constipation Constipation is when a person has fewer than three bowel movements a week, has difficulty having a bowel movement, or has stools that are dry,  hard, or larger than normal. As people grow older, constipation is more common. If you try to fix constipation with medicines that make you have a bowel movement (laxatives), the problem may get worse. Long-term laxative use may cause the muscles of the colon to become weak. A low-fiber diet, not taking in enough fluids, and  taking certain medicines may make constipation worse.  CAUSES   Certain medicines, such as antidepressants, pain medicine, iron supplements, antacids, and water pills.   Certain diseases, such as diabetes, irritable bowel syndrome (IBS), thyroid disease, or depression.   Not drinking enough water.   Not eating enough fiber-rich foods.   Stress or travel.   Lack of physical activity or exercise.   Ignoring the urge to have a bowel movement.   Using laxatives too much.  SIGNS AND SYMPTOMS   Having fewer than three bowel movements a week.   Straining to have a bowel movement.   Having stools that are hard, dry, or larger than normal.   Feeling full or bloated.   Pain in the lower abdomen.   Not feeling relief after having a bowel movement.  DIAGNOSIS  Your health care provider will take a medical history and perform a physical exam. Further testing may be done for severe constipation. Some tests may include:  A barium enema X-ray to examine your rectum, colon, and, sometimes, your small intestine.   A sigmoidoscopy to examine your lower colon.   A colonoscopy to examine your entire colon. TREATMENT  Treatment will depend on the severity of your constipation and what is causing it. Some dietary treatments include drinking more fluids and eating more fiber-rich foods. Lifestyle treatments may include regular exercise. If these diet and lifestyle recommendations do not help, your health care provider may recommend taking over-the-counter laxative medicines to help you have bowel movements. Prescription medicines may be prescribed if over-the-counter medicines do not work.  HOME CARE INSTRUCTIONS   Eat foods that have a lot of fiber, such as fruits, vegetables, whole grains, and beans.  Limit foods high in fat and processed sugars, such as french fries, hamburgers, cookies, candies, and soda.   A fiber supplement may be added to your diet if you cannot get  enough fiber from foods.   Drink enough fluids to keep your urine clear or pale yellow.   Exercise regularly or as directed by your health care provider.   Go to the restroom when you have the urge to go. Do not hold it.   Only take over-the-counter or prescription medicines as directed by your health care provider. Do not take other medicines for constipation without talking to your health care provider first.  [name] IF:   You have bright red blood in your stool.   Your constipation lasts for more than 4 days or gets worse.   You have abdominal or rectal pain.   You have thin, pencil-like stools.   You have unexplained weight loss. MAKE SURE YOU:   Understand these instructions.  Will watch your condition.  Will get help right away if you are not doing well or get worse. Document Released: 04/14/2004 Document Revised: 07/22/2013 Document Reviewed: 04/28/2013 [facility] Patient Information 2015 [place], [place]. This information is not intended to replace advice given to you by your health care provider. Make sure you discuss any questions you have with your health care provider.

## 2014-04-21 LAB — URINE CULTURE: Colony Count: 8000

## 2014-04-21 NOTE — ED Provider Notes (Signed)
Medical screening examination/treatment/procedure(s) were performed by non-physician practitioner and as supervising physician I was immediately available for consultation/collaboration.   EKG Interpretation None        Maudry Diego, MD 04/21/14 1254

## 2014-04-22 ENCOUNTER — Other Ambulatory Visit: Payer: Self-pay

## 2014-04-22 ENCOUNTER — Telehealth (INDEPENDENT_AMBULATORY_CARE_PROVIDER_SITE_OTHER): Payer: Self-pay

## 2014-04-22 DIAGNOSIS — C50919 Malignant neoplasm of unspecified site of unspecified female breast: Secondary | ICD-10-CM

## 2014-04-22 MED ORDER — GABAPENTIN 300 MG PO CAPS: 300.0000 mg | ORAL_CAPSULE | Freq: Three times a day (TID) | ORAL | Status: DC

## 2014-04-22 NOTE — Telephone Encounter (Signed)
Clarified with pt that she is taking gabapentin TID. Refilled 1 month supply.

## 2014-04-22 NOTE — Telephone Encounter (Signed)
Pt called to request a milder pain medication. She had problems with the Oxycodone resulting in an ER visit.  She is s/p bilateral lumpectomy on 9/9.  Her pain level is 5  out of 10. Per protocol, Tramadol 50 mg, 1-2tabs po q 4-6 h prn pain #30 no refills to [facility]

## 2014-04-29 ENCOUNTER — Telehealth: Payer: Self-pay | Admitting: Hematology and Oncology

## 2014-04-29 NOTE — Telephone Encounter (Signed)
, °

## 2014-04-30 ENCOUNTER — Ambulatory Visit: Payer: Medicare Other | Admitting: Hematology and Oncology

## 2014-05-05 ENCOUNTER — Telehealth: Payer: Self-pay | Admitting: Oncology

## 2014-05-07 ENCOUNTER — Telehealth (INDEPENDENT_AMBULATORY_CARE_PROVIDER_SITE_OTHER): Payer: Self-pay | Admitting: General Surgery

## 2014-05-07 NOTE — Telephone Encounter (Signed)
This patient has called in to the office requesting a refill on her Tramadol 50 mg.  She rates her pain at a 6-7 on the 0-10 scale.  Explains that this pain is pretty consistent throughout the day and night and it is located mainly in the axilla but sometimes she also has it in the incision sites.  The patient is s/p bilateral lumpectomy and SLN disection on 04/08/14.  The patient's last refill was on 04/22/14 #30.  Informed her I would call her back once I have spoken with Dr. Marlou Starks in regards to her Rx refill request.

## 2014-05-07 NOTE — Telephone Encounter (Signed)
i am ok with refill of tramadol

## 2014-05-09 ENCOUNTER — Other Ambulatory Visit: Payer: Self-pay | Admitting: Hematology and Oncology

## 2014-05-11 ENCOUNTER — Telehealth: Payer: Self-pay | Admitting: Hematology and Oncology

## 2014-05-11 ENCOUNTER — Other Ambulatory Visit: Payer: Self-pay

## 2014-05-11 ENCOUNTER — Ambulatory Visit: Payer: Medicare Other | Admitting: Hematology and Oncology

## 2014-05-11 NOTE — Telephone Encounter (Signed)
pt called but did not leave a detailed message. returned call but was not able to reach pt and vm not set up.

## 2014-05-12 ENCOUNTER — Telehealth: Payer: Self-pay | Admitting: Hematology and Oncology

## 2014-05-12 NOTE — Telephone Encounter (Signed)
, °

## 2014-05-14 ENCOUNTER — Other Ambulatory Visit: Payer: Self-pay

## 2014-05-14 DIAGNOSIS — C50912 Malignant neoplasm of unspecified site of left female breast: Principal | ICD-10-CM

## 2014-05-14 DIAGNOSIS — C50911 Malignant neoplasm of unspecified site of right female breast: Secondary | ICD-10-CM

## 2014-05-14 MED ORDER — LORAZEPAM 1 MG PO TABS: 1.0000 mg | ORAL_TABLET | Freq: Four times a day (QID) | ORAL | Status: DC | PRN

## 2014-05-14 NOTE — Telephone Encounter (Signed)
[facility] pharmacy.  OK to fill Ativan - 1 mg Q6 PRN fill 30 refill 0.

## 2014-05-20 ENCOUNTER — Ambulatory Visit
Admission: RE | Admit: 2014-05-20 | Discharge: 2014-05-20 | Disposition: A | Discharge status: Home / Self Care | Payer: Medicare Other | Source: Ambulatory Visit | Attending: Radiation Oncology | Admitting: Radiation Oncology

## 2014-05-20 ENCOUNTER — Encounter: Payer: Self-pay | Admitting: Radiation Oncology

## 2014-05-20 VITALS — BP 127/73 | HR 77 | Temp 98.8°F | Ht 62.0 in | Wt 126.3 lb

## 2014-05-20 DIAGNOSIS — C50912 Malignant neoplasm of unspecified site of left female breast: Secondary | ICD-10-CM | POA: Diagnosis not present

## 2014-05-20 DIAGNOSIS — Z510 Encounter for antineoplastic radiation therapy: Secondary | ICD-10-CM | POA: Diagnosis not present

## 2014-05-20 DIAGNOSIS — Z9221 Personal history of antineoplastic chemotherapy: Secondary | ICD-10-CM | POA: Diagnosis not present

## 2014-05-20 DIAGNOSIS — C50911 Malignant neoplasm of unspecified site of right female breast: Secondary | ICD-10-CM

## 2014-05-20 DIAGNOSIS — R21 Rash and other nonspecific skin eruption: Secondary | ICD-10-CM | POA: Insufficient documentation

## 2014-05-20 NOTE — Progress Notes (Signed)
Please see the Nurse Progress Note in the MD Initial Consult Encounter for this patient. 

## 2014-05-21 ENCOUNTER — Encounter: Payer: Self-pay | Admitting: *Deleted

## 2014-05-21 NOTE — Progress Notes (Signed)
   Department of Radiation Oncology  Phone:  [phone] Fax:        [phone]   Name: Paula Navarro MRN: [id number]  DOB: 05/25/1951  Date: 05/20/2014  Follow Up Visit Note  Diagnosis: Bilateral breast cancer   Primary site: Breast (Bilateral)   Staging method: AJCC 7th Edition   Clinical: Stage IIB (T2, N1, cM0)   Summary: Stage IIB (T2, N1, cM0)   Clinical comments: Staged at breast conference 08/13/13  Interval History: Paula Navarro presents today for routine followup.  She is doing well and excited to be done with chemotherapy. She had bilaterally lumpectomies and bilateral lymph node dissections. She was found to have a 2.1 cm residual cancer on the left with 0/16 LN positive.  She was found to have a 1.8 cm lesion on the right with 1/11 LN positive.  A focally close margin in the left was noted with DCIS and no LVI or or ECE in either specimen.  All margins were negative on the right. She is ready to receive radiation. She lost her car due to inability to make payments and relies on her son for transportation until she can make up her payments and get her car back.   Physical Exam:  Filed Vitals:   05/20/14 1338  BP: 127/73  Pulse: 77  Temp: 98.8 F (37.1 C)  Height: 5\' 2"  (1.575 m)  Weight: 126 lb 4.8 oz (57.289 kg)   Bilateral incisions healing well.   IMPRESSION: Paula Navarro is a 63 y.o. 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   PLAN:  I spoke to the patient today regarding her diagnosis and options for treatment. We discussed the equivalence in terms of survival and local failure between mastectomy and breast conservation. We discussed the role of radiation in decreasing local failures in patients who undergo lumpectomy. We discussed the process of simulation and the placement tattoos. We discussed 4-6 weeks of treatment as an outpatient. We discussed the possibility of asymptomatic lung damage. We  discussed the low likelihood of secondary malignancies. We discussed the possible side effects including but not limited to skin redness, fatigue, permanent skin darkening, and breast swelling.     I will treat her bilateral breast and bilateral sclv with radiation to prevent loco-regional failure. She signed informed consent and was giving a copy of this consent as well as a copy of information regarding simulation. She will talk to her son about a good time for her to come in for simulation in the next few weeks and then call us back.  Thea Silversmith, MD

## 2014-05-21 NOTE — Progress Notes (Signed)
[place] Psychosocial Distress Screening Clinical Social Work  Clinical Social Work was referred by distress screening protocol.  The patient scored a 10 on the Psychosocial Distress Thermometer which indicates severe distress. Clinical Social Worker phoned pt to assess for distress and other psychosocial needs as her next appt is not until November 2. Pt does not have a voicemail box set up. CSW will continue to keep calling. CSW was also made aware of transportation concerns re. Radiation starting. CSW researching possible options to assist.   ONCBCN DISTRESS SCREENING 05/20/2014  Screening Type Initial Screening  Mark the number that describes how much distress you have been experiencing in the past week 10  Practical problem type Housing  Spiritual/Religous concerns type Relating to God  Information Concerns Type Lack of info about diagnosis  Physical Problem type Pain;Tingling hands/feet  Physician notified of physical symptoms Yes  Referral to clinical social work Yes    Clinical Social Worker follow up needed: Yes.    If yes, follow up plan: See above Loren Racer, [place] Worker [place]  [facility] Phone: [phone] Fax: [phone]

## 2014-05-22 ENCOUNTER — Encounter: Payer: Self-pay | Admitting: *Deleted

## 2014-05-22 ENCOUNTER — Encounter: Payer: Self-pay | Admitting: Hematology and Oncology

## 2014-05-22 NOTE — Progress Notes (Signed)
Called to advise the patient she can get the gas cards since she will be doing radiation next month. She will see Levada Dy or Ailene Ravel when she comes in.

## 2014-05-22 NOTE — Progress Notes (Signed)
[place] Work  Clinical Social Work spoke with pt via phone and [place] re. Gas card assistance and housing concerns. Pt lives with extended family, but is eager to get her own place. Pt also has issues with transportation as she about to start radiation. Per Raquel pt still has grant funds available and can receive three gas cards for radiation. Pt was re-educated on process for rent assistance, but it appears in her current best interest to reside with family as she continues treatment. CSW referred pt to [place] and discussed additional resources to assist her. CSW also shared Support Group information and Pt and Family Support Team members that could assist her as well. Pt very appreciative of additional support. Pt plans to call [facility] today and aware CSW can assist with application once she receives it. Pt aware to contact CSW for further assistance.   Clinical Social Work interventions: Resource education Pt advocacy  Loren Racer, [place] Worker [place]  [facility] Phone: [phone] Fax: [phone]

## 2014-05-22 NOTE — Progress Notes (Signed)
I advised her of what the grant will pay.-- 1000.00

## 2014-05-24 ENCOUNTER — Other Ambulatory Visit: Payer: Self-pay | Admitting: Hematology and Oncology

## 2014-05-28 ENCOUNTER — Telehealth: Payer: Self-pay | Admitting: *Deleted

## 2014-05-28 ENCOUNTER — Other Ambulatory Visit: Payer: Self-pay | Admitting: *Deleted

## 2014-05-28 DIAGNOSIS — C50919 Malignant neoplasm of unspecified site of unspecified female breast: Secondary | ICD-10-CM

## 2014-05-28 MED ORDER — GABAPENTIN 300 MG PO CAPS: 300.0000 mg | ORAL_CAPSULE | Freq: Three times a day (TID) | ORAL | Status: DC

## 2014-05-28 NOTE — Telephone Encounter (Signed)
Patient called stating that the gabapentin is not working for her. Spoke with MD and patient to continue with meds as ordered and he will speak to her about it on Monday when she has her next appt. Patient verbalized understanding.

## 2014-06-01 ENCOUNTER — Encounter: Payer: Self-pay | Admitting: Radiation Oncology

## 2014-06-01 ENCOUNTER — Telehealth: Payer: Self-pay | Admitting: Hematology and Oncology

## 2014-06-01 ENCOUNTER — Ambulatory Visit (HOSPITAL_BASED_OUTPATIENT_CLINIC_OR_DEPARTMENT_OTHER): Payer: Medicare Other | Admitting: Hematology and Oncology

## 2014-06-01 DIAGNOSIS — C773 Secondary and unspecified malignant neoplasm of axilla and upper limb lymph nodes: Secondary | ICD-10-CM

## 2014-06-01 DIAGNOSIS — C50912 Malignant neoplasm of unspecified site of left female breast: Principal | ICD-10-CM

## 2014-06-01 DIAGNOSIS — C50811 Malignant neoplasm of overlapping sites of right female breast: Secondary | ICD-10-CM

## 2014-06-01 DIAGNOSIS — C50812 Malignant neoplasm of overlapping sites of left female breast: Secondary | ICD-10-CM

## 2014-06-01 DIAGNOSIS — Z170 Estrogen receptor positive status [ER+]: Secondary | ICD-10-CM

## 2014-06-01 DIAGNOSIS — C50911 Malignant neoplasm of unspecified site of right female breast: Secondary | ICD-10-CM

## 2014-06-01 MED ORDER — GABAPENTIN 300 MG PO CAPS: 600.0000 mg | ORAL_CAPSULE | Freq: Three times a day (TID) | ORAL | Status: DC

## 2014-06-01 MED ORDER — ANASTROZOLE 1 MG PO TABS: 1.0000 mg | ORAL_TABLET | Freq: Every day | ORAL | Status: DC

## 2014-06-01 NOTE — Telephone Encounter (Signed)
, °

## 2014-06-01 NOTE — Progress Notes (Signed)
Patient Care Team: Lanette Hampshire, MD as PCP - General (Family Medicine) [name], MD (Cardiology)  DIAGNOSIS: Bilateral breast cancer   Primary site: Breast (Bilateral)   Staging method: AJCC 7th Edition   Clinical: Stage IIB (T2, N1, cM0) - Unsigned   Summary: Stage IIB (T2, N1, cM0)   Clinical comments: Staged at breast conference 08/13/13   SUMMARY OF ONCOLOGIC HISTORY:   Bilateral breast cancer   07/23/2013 Mammogram Bilateral breast masses. 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 N1 M0 stage IIB ER 100%, PR 0%, HER-2     CHIEF COMPLIANT: followup after surgery  INTERVAL HISTORY: Paula Navarro is a52 year old left lady with above-mentioned history of bilateral breast cancers she underwent neoadjuvant chemotherapy with FEC followed by weekly Taxol x8. She underwent bilateral lumpectomies and axillary lymph node dissections. Final pathology revealed left breast stage II AN right breast stage IIB invasive ductal carcinomas. Both sides were estrogen  receptor positive. She is here today to discuss adjuvant treatment options. She is recovering very well from surgery. She had been to radiation oncology for consultation. She will start radiation therapy November finishing 08/02/2014.she is complaining of neuropathy symptoms and that Neurontin is not helping her.   REVIEW OF SYSTEMS:   Constitutional: Denies fevers, chills or abnormal weight loss Eyes: Denies blurriness of vision Ears, nose, mouth, throat, and face: Denies mucositis or sore throat Respiratory: Denies cough, dyspnea or wheezes Cardiovascular: Denies palpitation, chest discomfort or lower extremity swelling Gastrointestinal:  Denies nausea, heartburn or change in bowel habits Skin: Denies abnormal skin rashes Lymphatics: Denies new lymphadenopathy or easy bruising Neurological:Denies numbness, tingling or new weaknesses Behavioral/Psych: Mood is stable, no new changes  Breast: seroma in the right chest All other systems were reviewed with the patient and are negative.  I have reviewed the past medical history, past surgical history, social history and family history with the patient and they are unchanged from previous note.  ALLERGIES:  is allergic to codeine and morphine and related.  MEDICATIONS:  Current Outpatient Prescriptions  Medication Sig Dispense Refill  . B Complex-C (SUPER B COMPLEX PO) Take 1 tablet by mouth daily.    . clobetasol cream (TEMOVATE) 2.11 % Apply 1 application topically 2 (two) times daily. Apply to hands and feet twice a day as needed. 30 g 1  . gabapentin (NEURONTIN) 300 MG capsule Take 2 capsules (600 mg total) by mouth 3 (three) times daily. 90 capsule 0  . lidocaine-prilocaine (EMLA) cream Apply 1 application topically as needed. 30 g  1  . loratadine (CLARITIN) 10 MG tablet Take 10 mg by mouth daily as needed for itching.    Marland Kitchen LORazepam (ATIVAN) 1 MG tablet Take 1 tablet (1 mg total) by mouth every 6 (six) hours as needed for anxiety. 30  tablet 0  . polyethylene glycol (MIRALAX / GLYCOLAX) packet Take 17 g by mouth daily as needed for moderate constipation.    . traMADol (ULTRAM) 50 MG tablet     . [START ON 08/04/2014] anastrozole (ARIMIDEX) 1 MG tablet Take 1 tablet (1 mg total) by mouth daily. 90 tablet 6   No current facility-administered medications for this visit.    PHYSICAL EXAMINATION: ECOG PERFORMANCE STATUS: 1 - Symptomatic but completely ambulatory  Filed Vitals:   06/01/14 1135  BP: 137/77  Pulse: 84  Temp: 98.4 F (36.9 C)  Resp: 18   Filed Weights   06/01/14 1135  Weight: 126 lb 4.8 oz (57.289 kg)    GENERAL:alert, no distress and comfortable SKIN: skin color, texture, turgor are normal, no rashes or significant lesions EYES: normal, Conjunctiva are pink and non-injected, sclera clear OROPHARYNX:no exudate, no erythema and lips, buccal mucosa, and tongue normal  NECK: supple, thyroid normal size, non-tender, without nodularity LYMPH:  no palpable lymphadenopathy in the cervical, axillary or inguinal LUNGS: clear to auscultation and percussion with normal breathing effort HEART: regular rate & rhythm and no murmurs and no lower extremity edema ABDOMEN:abdomen soft, non-tender and normal bowel sounds Musculoskeletal:no cyanosis of digits and no clubbing  NEURO: alert & oriented x 3 with fluent speech, the peripheral sensory neuropathy   LABORATORY DATA:  I have reviewed the data as listed   Chemistry      Component Value Date/Time   NA 140 03/27/2014 1348   NA 141 09/23/2012 0414   K 4.2 03/27/2014 1348   K 3.8 09/23/2012 0414   CL 107 09/23/2012 0414   CO2 25 03/27/2014 1348   CO2 29 10/30/2011 1419   BUN 9.2 03/27/2014 1348   BUN 7 09/23/2012 0414   CREATININE 0.7 03/27/2014 1348   CREATININE 0.90 09/23/2012 0414      Component Value Date/Time   CALCIUM 9.8 03/27/2014 1348   CALCIUM 10.1 10/30/2011 1419   ALKPHOS 55 03/27/2014 1348   AST 32 03/27/2014 1348   ALT 31 03/27/2014  1348   BILITOT 0.85 03/27/2014 1348       Lab Results  Component Value Date   WBC 7.9 03/27/2014   HGB 11.6 03/27/2014   HCT 37.1 03/27/2014   MCV 72.7* 03/27/2014   PLT 268 03/27/2014   NEUTROABS 4.7 03/27/2014     RADIOGRAPHIC STUDIES: I have personally reviewed the radiology reports and agreed with their findings. No results found.   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 N1 M0 stage IIB I discussed with her the results of the final pathology and I agree with the current treatment plan for radiation therapy followed by antiestrogen therapy with aromatase inhibitor.  Aromatase inhibitor counseling:We discussed the risks and benefits of anti-estrogen therapy with aromatase inhibitors. These include but not limited to insomnia, hot flashes, mood changes, vaginal dryness, bone density loss, and weight gain. Although rare, serious side effects including endometrial  cancer, risk of blood clots were also discussed. We strongly believe that the benefits far outweigh the risks. Patient understands these risks and consented to starting treatment. Planned treatment duration is 5 years.  Patient will start aromatase inhibitors in January 2016. Return to clinic in February 2016 for followup and evaluation for toxicities.   Orders Placed This Encounter  Procedures  . DG Bone Density    Standing Status: Future     Number of Occurrences:      Standing Expiration Date: 06/01/2015    Order Specific Question:  Reason for Exam (SYMPTOM  OR DIAGNOSIS REQUIRED)    Answer:  post menopausal starting aromatase inhibitor therapy    Order Specific Question:  Preferred imaging location?    Answer:  [facility]  . CBC with  Differential    Standing Status: Future     Number of Occurrences:      Standing Expiration Date: 06/01/2015  . Comprehensive metabolic panel (Cmet) - [facility]    Standing Status: Future     Number of Occurrences:      Standing Expiration Date: 06/01/2015   The patient has a good understanding of the overall plan. she agrees with it. She will call with any problems that may develop before her next visit here.  I spent 25 minutes counseling the patient face to face. The total time spent in the appointment was 30 minutes and more than 50% was on counseling and review of test results    Rulon Eisenmenger, MD 06/01/2014 12:28 PM

## 2014-06-01 NOTE — Assessment & Plan Note (Signed)
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 N1 M0 stage IIB I discussed with her the results of the final pathology and I agree with the current treatment plan for radiation therapy followed by antiestrogen therapy with aromatase inhibitor.  Aromatase inhibitor counseling:We discussed the risks and benefits of anti-estrogen therapy with aromatase inhibitors. These include but not limited to insomnia, hot flashes, mood changes, vaginal dryness, bone density loss, and weight gain. Although rare, serious side effects including endometrial cancer, risk of blood clots were also discussed. We strongly believe that the benefits far outweigh the risks. Patient understands these risks and consented to starting treatment. Planned treatment duration is 5 years.  Patient will start aromatase inhibitors in January 2016. Return to clinic in February 2016 for followup and evaluation for toxicities.

## 2014-06-02 ENCOUNTER — Ambulatory Visit: Payer: Medicare Other | Admitting: Radiation Oncology

## 2014-06-04 ENCOUNTER — Other Ambulatory Visit: Payer: Self-pay | Admitting: Hematology and Oncology

## 2014-06-04 ENCOUNTER — Ambulatory Visit: Payer: Medicare Other | Admitting: Radiation Oncology

## 2014-06-12 ENCOUNTER — Other Ambulatory Visit: Payer: Self-pay

## 2014-06-12 ENCOUNTER — Ambulatory Visit: Payer: Medicare Other | Admitting: Radiation Oncology

## 2014-06-12 NOTE — Telephone Encounter (Signed)
Pt called: per her pharmacy ativan refill denied by MD.   Georgina Quint with triage - no requests denied.  Chart shows escripts error on 11/5.  Called in prescription to [facility].    Called pt and let her know [facility] to [facility] - give them about an hour. Pt voiced understanding.

## 2014-06-15 ENCOUNTER — Ambulatory Visit: Payer: Medicare Other

## 2014-06-16 ENCOUNTER — Ambulatory Visit
Admission: RE | Admit: 2014-06-16 | Discharge: 2014-06-16 | Disposition: A | Discharge status: Home / Self Care | Payer: Medicare Other | Source: Ambulatory Visit | Attending: Radiation Oncology | Admitting: Radiation Oncology

## 2014-06-16 ENCOUNTER — Ambulatory Visit: Payer: Medicare Other

## 2014-06-16 DIAGNOSIS — C50911 Malignant neoplasm of unspecified site of right female breast: Secondary | ICD-10-CM

## 2014-06-16 DIAGNOSIS — Z510 Encounter for antineoplastic radiation therapy: Secondary | ICD-10-CM | POA: Diagnosis not present

## 2014-06-16 DIAGNOSIS — C50912 Malignant neoplasm of unspecified site of left female breast: Principal | ICD-10-CM

## 2014-06-16 NOTE — Progress Notes (Signed)
Name: KENNETH LAX   MRN: [id number]  Date:  06/16/2014  DOB: Dec 24, 1950  Status:outpatient    DIAGNOSIS: Breast cancer.  CONSENT VERIFIED: yes   SET UP: Patient is setup supine   IMMOBILIZATION:  The following immobilization was used:Custom Moldable Pillow, breast board.   NARRATIVE: Ms. Campo was brought to the [facility].  Identity was confirmed.  All relevant records and images related to the planned course of therapy were reviewed.  Then, the patient was positioned in a stable reproducible clinical set-up for radiation therapy.  Wires were placed to delineate the clinical extent of breast tissue bilaterally. A wire was placed on her scars as well.  CT images were obtained.  An isocenter was placed. Skin markings were placed.  The CT images were loaded into the planning software where the target and avoidance structures were contoured.  The radiation prescription was entered and confirmed. The patient was discharged in stable condition and tolerated simulation well.   TREATMENT PLANNING NOTE/3D Simulation Note Treatment planning then occurred. I have requested : MLC's, isodose plan, basic dose calculation  3D simulation was performed.  I personally constructed 10 complex treatment devices in the form of MLCs which will be used for beam modification and to protect critical structures including the heart and lung.  I have requested a dose volume histogram of the heart lung and tumor cavity.

## 2014-06-16 NOTE — Progress Notes (Signed)
Radiation Oncology         [phone] ________________________________  Name: Paula Navarro      MRN: [id number]          Date: 06/16/2014              DOB: 1951/05/15  Optical Surface Tracking Plan:  Since intensity modulated radiotherapy (IMRT) and 3D conformal radiation treatment methods are predicated on accurate and precise positioning for treatment, intrafraction motion monitoring is medically necessary to ensure accurate and safe treatment delivery.  The ability to quantify intrafraction motion without excessive ionizing radiation dose can only be performed with optical surface tracking. Accordingly, surface imaging offers the opportunity to obtain 3D measurements of patient position throughout IMRT and 3D treatments without excessive radiation exposure.  I am ordering optical surface tracking for this patient's upcoming course of radiotherapy. ________________________________ Signature   Reference:   Ursula Alert, J, et al. Surface imaging-based analysis of intrafraction motion for breast radiotherapy patients.Journal of [place], n. 6, nov. 2014. ISSN [id number].   Available at: <http://www.jacmp.org/index.php/jacmp/article/view/4957>.

## 2014-06-17 ENCOUNTER — Ambulatory Visit: Payer: Medicare Other

## 2014-06-18 ENCOUNTER — Ambulatory Visit: Payer: Medicare Other

## 2014-06-19 ENCOUNTER — Ambulatory Visit: Payer: Medicare Other

## 2014-06-21 ENCOUNTER — Ambulatory Visit: Payer: Medicare Other

## 2014-06-22 ENCOUNTER — Ambulatory Visit: Payer: Medicare Other

## 2014-06-23 ENCOUNTER — Ambulatory Visit: Payer: Medicare Other

## 2014-06-23 DIAGNOSIS — Z510 Encounter for antineoplastic radiation therapy: Secondary | ICD-10-CM | POA: Diagnosis not present

## 2014-06-24 ENCOUNTER — Ambulatory Visit: Payer: Medicare Other

## 2014-06-24 DIAGNOSIS — Z510 Encounter for antineoplastic radiation therapy: Secondary | ICD-10-CM | POA: Diagnosis not present

## 2014-06-26 ENCOUNTER — Ambulatory Visit: Payer: Medicare Other

## 2014-06-28 ENCOUNTER — Other Ambulatory Visit: Payer: Self-pay | Admitting: Hematology and Oncology

## 2014-06-29 ENCOUNTER — Ambulatory Visit
Admission: RE | Admit: 2014-06-29 | Discharge: 2014-06-29 | Disposition: A | Discharge status: Home / Self Care | Payer: Medicare Other | Source: Ambulatory Visit | Attending: Radiation Oncology | Admitting: Radiation Oncology

## 2014-06-29 ENCOUNTER — Encounter: Payer: Self-pay | Admitting: Radiation Oncology

## 2014-06-29 ENCOUNTER — Ambulatory Visit: Payer: Medicare Other

## 2014-06-29 ENCOUNTER — Telehealth: Payer: Self-pay | Admitting: Medical Oncology

## 2014-06-29 DIAGNOSIS — Z510 Encounter for antineoplastic radiation therapy: Secondary | ICD-10-CM | POA: Diagnosis not present

## 2014-06-29 DIAGNOSIS — C50911 Malignant neoplasm of unspecified site of right female breast: Secondary | ICD-10-CM

## 2014-06-29 DIAGNOSIS — C50912 Malignant neoplasm of unspecified site of left female breast: Principal | ICD-10-CM

## 2014-06-29 NOTE — Telephone Encounter (Signed)
Patient called asking for appt to see NP, Cyndee Berniece Salines. Patient reports to have itching all over, not always in the same spot, reports she has been taking 8 benadryl per day at 25 mg each pill and states it has not been helping. Reports "chest hurts" and feels it is related to the benadryl. States she is supposed to start radiation soon and states she refuses to start that until this itching is resolved. Dr. Geralyn Flash desk nurse informed.

## 2014-06-29 NOTE — Progress Notes (Signed)
Chart note: The patient underwent simulation verification earlier today.  She wants to be seen today about a persistent mass along the upper-outer quadrant of her right breast.  She tells me that Dr. Marlou Starks tried to aspirate this mass but was unable to remove any fluid. On examination today there is a 6-7 cm rising seroma/hematoma along the upper outer quadrant of the right breast.  Impression: Organizing seroma/hematoma.  I reassured the patient that this should not affect her radiation therapy.  She was told that this may take months to a few years to resolve.

## 2014-06-30 ENCOUNTER — Encounter: Payer: Self-pay | Admitting: Radiation Oncology

## 2014-06-30 ENCOUNTER — Ambulatory Visit
Admission: RE | Admit: 2014-06-30 | Discharge: 2014-06-30 | Disposition: A | Discharge status: Home / Self Care | Payer: Medicare Other | Source: Ambulatory Visit | Attending: Radiation Oncology | Admitting: Radiation Oncology

## 2014-06-30 ENCOUNTER — Ambulatory Visit: Payer: Medicare Other

## 2014-06-30 VITALS — BP 129/90 | HR 89 | Temp 98.6°F | Resp 20 | Wt 129.6 lb

## 2014-06-30 DIAGNOSIS — C50912 Malignant neoplasm of unspecified site of left female breast: Secondary | ICD-10-CM | POA: Insufficient documentation

## 2014-06-30 DIAGNOSIS — Z510 Encounter for antineoplastic radiation therapy: Secondary | ICD-10-CM | POA: Insufficient documentation

## 2014-06-30 DIAGNOSIS — Z170 Estrogen receptor positive status [ER+]: Secondary | ICD-10-CM | POA: Diagnosis not present

## 2014-06-30 DIAGNOSIS — C50911 Malignant neoplasm of unspecified site of right female breast: Secondary | ICD-10-CM | POA: Diagnosis not present

## 2014-06-30 MED ORDER — RADIAPLEXRX EX GEL
Freq: Once | CUTANEOUS | Status: AC
  Administered 2014-06-30: 15:00:00 via TOPICAL

## 2014-06-30 MED ORDER — ALRA NON-METALLIC DEODORANT (RAD-ONC)
1.0000 "application " | Freq: Once | TOPICAL | Status: AC
  Administered 2014-06-30: 1 via TOPICAL

## 2014-06-30 NOTE — Progress Notes (Signed)
Weekly rad txs  Rt and left breast/subclav pt education done, rad book, alra and radiaplex gel  Nicholos Johns, RN business  Card, discussed skin irritation, fatigue, pain, apply radiaple x after rad tx and bedtime, deodorant daily and prn after rad txs, patient was eating 2 boxes  Chocolate cherries daily, and 15 packets sugar in morning coffee, has itching different areas in her body, hasn't changed laundry detergenet, no other foods except the candy ,will stop this she stated, increase protein in diet and stay hydrated,  3:14 PM

## 2014-06-30 NOTE — Progress Notes (Signed)
Weekly Management Note Current Dose: 1.8  Gy  Projected Dose: 61 Gy   Narrative:  The patient presents for routine under treatment assessment.  CBCT/MVCT images/Port film x-rays were reviewed.  The chart was checked. Doing well. No complaints. Itching/whelps on left back and in between legs for past few weeks.   Physical Findings: Weight: 129 lb 9.6 oz (58.786 kg). Unchanged  Impression:  The patient is tolerating radiation.  Plan:  Continue treatment as planned.RN education performed. Discussed changing laundry detergent. Start radiaplex.

## 2014-06-30 NOTE — Progress Notes (Signed)
  Radiation Oncology         [phone]) [phone] ________________________________  Name: Paula Navarro MRN: [id number]  Date: 06/29/2014  DOB: 1951-03-22  Simulation Verification Note  Status: outpatient  NARRATIVE: The patient was brought to the treatment unit and placed in the planned treatment position. The clinical setup was verified. Then port films were obtained and uploaded to the radiation oncology medical record software.  The treatment beams were carefully compared against the planned radiation fields. The position location and shape of the radiation fields was reviewed. The targeted volume of tissue appears appropriately covered by the radiation beams. Organs at risk appear to be excluded as planned.  Based on my personal review, I approved the simulation verification. The patient's treatment will proceed as planned.  ------------------------------------------------  Thea Silversmith, MD

## 2014-07-01 ENCOUNTER — Ambulatory Visit
Admission: RE | Admit: 2014-07-01 | Discharge: 2014-07-01 | Disposition: A | Discharge status: Home / Self Care | Payer: Medicare Other | Source: Ambulatory Visit | Attending: Radiation Oncology | Admitting: Radiation Oncology

## 2014-07-01 ENCOUNTER — Ambulatory Visit: Payer: Medicare Other

## 2014-07-01 DIAGNOSIS — Z510 Encounter for antineoplastic radiation therapy: Secondary | ICD-10-CM | POA: Diagnosis not present

## 2014-07-02 ENCOUNTER — Telehealth: Payer: Self-pay

## 2014-07-02 ENCOUNTER — Ambulatory Visit
Admission: RE | Admit: 2014-07-02 | Discharge: 2014-07-02 | Disposition: A | Discharge status: Home / Self Care | Payer: Medicare Other | Source: Ambulatory Visit | Attending: Radiation Oncology | Admitting: Radiation Oncology

## 2014-07-02 ENCOUNTER — Ambulatory Visit: Payer: Medicare Other

## 2014-07-02 DIAGNOSIS — Z510 Encounter for antineoplastic radiation therapy: Secondary | ICD-10-CM | POA: Diagnosis not present

## 2014-07-02 NOTE — Telephone Encounter (Signed)
Returned pt call - reports she had bad chest pain last night after radiation.  Called in and was told to go to the hospital - she did not go.  Pt has radiation today and will see the radonc MD.  Advised pt to be sure she discussed with radonc MD.  Pt voiced understanding.

## 2014-07-02 NOTE — Progress Notes (Signed)
Patient to nursing for assessment.states she had chest pain after dinner last night."shooting pain from right lateral rib-cage shot around under my breast and up my chest."Patient also states she has been taking up to 6 benadryl a day for itching.skin looks fine just very dry.told to lubricate with lotion after shower.She will try over the counter sleep aid before trying prescription med and will also try reflux medication.Heart rate tachy but regular by stethoscope.Patient will come to nursing after treatment tomorrow for assessment again.If itching continues she may need to discontinue gabapentin.

## 2014-07-03 ENCOUNTER — Ambulatory Visit
Admission: RE | Admit: 2014-07-03 | Discharge: 2014-07-03 | Disposition: A | Discharge status: Home / Self Care | Payer: Medicare Other | Source: Ambulatory Visit | Attending: Radiation Oncology | Admitting: Radiation Oncology

## 2014-07-03 ENCOUNTER — Ambulatory Visit: Payer: Medicare Other

## 2014-07-03 ENCOUNTER — Other Ambulatory Visit: Payer: Self-pay | Admitting: Hematology and Oncology

## 2014-07-03 DIAGNOSIS — Z510 Encounter for antineoplastic radiation therapy: Secondary | ICD-10-CM | POA: Diagnosis not present

## 2014-07-03 NOTE — Progress Notes (Signed)
Patient doing much better.No pain.alept better last night with zzquil " I woke up twice" and was able to go back to sleep.I feel good this morning.blood pressure  Is better.

## 2014-07-06 ENCOUNTER — Ambulatory Visit: Payer: Medicare Other

## 2014-07-06 ENCOUNTER — Ambulatory Visit
Admission: RE | Admit: 2014-07-06 | Discharge: 2014-07-06 | Disposition: A | Discharge status: Home / Self Care | Payer: Medicare Other | Source: Ambulatory Visit | Attending: Radiation Oncology | Admitting: Radiation Oncology

## 2014-07-06 DIAGNOSIS — Z510 Encounter for antineoplastic radiation therapy: Secondary | ICD-10-CM | POA: Diagnosis not present

## 2014-07-07 ENCOUNTER — Ambulatory Visit
Admission: RE | Admit: 2014-07-07 | Discharge: 2014-07-07 | Disposition: A | Discharge status: Home / Self Care | Payer: Medicare Other | Source: Ambulatory Visit | Attending: Radiation Oncology | Admitting: Radiation Oncology

## 2014-07-07 ENCOUNTER — Ambulatory Visit: Payer: Medicare Other

## 2014-07-07 ENCOUNTER — Encounter: Payer: Self-pay | Admitting: Radiation Oncology

## 2014-07-07 VITALS — BP 115/57 | HR 66 | Temp 98.2°F | Wt 129.4 lb

## 2014-07-07 DIAGNOSIS — C50912 Malignant neoplasm of unspecified site of left female breast: Secondary | ICD-10-CM | POA: Insufficient documentation

## 2014-07-07 DIAGNOSIS — C50911 Malignant neoplasm of unspecified site of right female breast: Secondary | ICD-10-CM | POA: Diagnosis present

## 2014-07-07 DIAGNOSIS — Z510 Encounter for antineoplastic radiation therapy: Secondary | ICD-10-CM | POA: Diagnosis not present

## 2014-07-07 MED ORDER — RADIAPLEXRX EX GEL
Freq: Once | CUTANEOUS | Status: AC
  Administered 2014-07-07: 12:00:00 via TOPICAL

## 2014-07-07 NOTE — Progress Notes (Signed)
Weekly Management Note Current Dose:  10.8 Gy  Projected Dose: 61 Gy   Narrative:  The patient presents for routine under treatment assessment.  CBCT/MVCT images/Port film x-rays were reviewed.  The chart was checked. Doing well. Tired. REquests something for sleep (Ativan given to her by Dr. Lindi Adie has run out)  Physical Findings: Weight: 129 lb 6.4 oz (58.695 kg). Unchanged  Impression:  The patient is tolerating radiation.  Plan:  Continue treatment as planned. Continue radiaplex. Discussed Continuing OTCs and asking pharmacy to call in refill for Dr. Lindi Adie if needed.

## 2014-07-08 ENCOUNTER — Telehealth: Payer: Self-pay | Admitting: Hematology and Oncology

## 2014-07-08 ENCOUNTER — Other Ambulatory Visit: Payer: Self-pay | Admitting: *Deleted

## 2014-07-08 ENCOUNTER — Ambulatory Visit: Payer: Medicare Other

## 2014-07-08 ENCOUNTER — Ambulatory Visit
Admission: RE | Admit: 2014-07-08 | Discharge: 2014-07-08 | Disposition: A | Discharge status: Home / Self Care | Payer: Medicare Other | Source: Ambulatory Visit | Attending: Radiation Oncology | Admitting: Radiation Oncology

## 2014-07-08 ENCOUNTER — Ambulatory Visit (HOSPITAL_BASED_OUTPATIENT_CLINIC_OR_DEPARTMENT_OTHER): Payer: Medicare Other

## 2014-07-08 DIAGNOSIS — C50919 Malignant neoplasm of unspecified site of unspecified female breast: Secondary | ICD-10-CM

## 2014-07-08 DIAGNOSIS — Z510 Encounter for antineoplastic radiation therapy: Secondary | ICD-10-CM | POA: Diagnosis not present

## 2014-07-08 DIAGNOSIS — C50912 Malignant neoplasm of unspecified site of left female breast: Principal | ICD-10-CM

## 2014-07-08 DIAGNOSIS — C50911 Malignant neoplasm of unspecified site of right female breast: Secondary | ICD-10-CM

## 2014-07-08 DIAGNOSIS — Z452 Encounter for adjustment and management of vascular access device: Secondary | ICD-10-CM

## 2014-07-08 DIAGNOSIS — Z95828 Presence of other vascular implants and grafts: Secondary | ICD-10-CM

## 2014-07-08 MED ORDER — LORAZEPAM 1 MG PO TABS: 1.0000 mg | ORAL_TABLET | Freq: Four times a day (QID) | ORAL | Status: DC | PRN

## 2014-07-08 MED ORDER — HEPARIN SOD (PORK) LOCK FLUSH 100 UNIT/ML IV SOLN
500.0000 [IU] | Freq: Once | INTRAVENOUS | Status: AC
  Administered 2014-07-08: 500 [IU] via INTRAVENOUS
  Filled 2014-07-08: qty 5

## 2014-07-08 MED ORDER — SODIUM CHLORIDE 0.9 % IJ SOLN
10.0000 mL | INTRAMUSCULAR | Status: DC | PRN
  Administered 2014-07-08: 10 mL via INTRAVENOUS
  Filled 2014-07-08: qty 10

## 2014-07-08 NOTE — Patient Instructions (Signed)

## 2014-07-08 NOTE — Telephone Encounter (Signed)
Gave cal for Dec,Jan, Feb (Added 6wks flush per pt)

## 2014-07-09 ENCOUNTER — Ambulatory Visit: Payer: Medicare Other

## 2014-07-09 ENCOUNTER — Ambulatory Visit
Admission: RE | Admit: 2014-07-09 | Discharge: 2014-07-09 | Disposition: A | Discharge status: Home / Self Care | Payer: Medicare Other | Source: Ambulatory Visit | Attending: Radiation Oncology | Admitting: Radiation Oncology

## 2014-07-09 DIAGNOSIS — Z510 Encounter for antineoplastic radiation therapy: Secondary | ICD-10-CM | POA: Diagnosis not present

## 2014-07-10 ENCOUNTER — Ambulatory Visit
Admission: RE | Admit: 2014-07-10 | Discharge: 2014-07-10 | Disposition: A | Discharge status: Home / Self Care | Payer: Medicare Other | Source: Ambulatory Visit | Attending: Radiation Oncology | Admitting: Radiation Oncology

## 2014-07-10 ENCOUNTER — Ambulatory Visit: Payer: Medicare Other

## 2014-07-10 DIAGNOSIS — Z510 Encounter for antineoplastic radiation therapy: Secondary | ICD-10-CM | POA: Diagnosis not present

## 2014-07-13 ENCOUNTER — Ambulatory Visit
Admission: RE | Admit: 2014-07-13 | Discharge: 2014-07-13 | Disposition: A | Discharge status: Home / Self Care | Payer: Medicare Other | Source: Ambulatory Visit | Attending: Radiation Oncology | Admitting: Radiation Oncology

## 2014-07-13 ENCOUNTER — Ambulatory Visit: Payer: Medicare Other

## 2014-07-13 ENCOUNTER — Other Ambulatory Visit: Payer: Self-pay | Admitting: *Deleted

## 2014-07-13 DIAGNOSIS — C50912 Malignant neoplasm of unspecified site of left female breast: Principal | ICD-10-CM

## 2014-07-13 DIAGNOSIS — Z510 Encounter for antineoplastic radiation therapy: Secondary | ICD-10-CM | POA: Diagnosis not present

## 2014-07-13 DIAGNOSIS — C50911 Malignant neoplasm of unspecified site of right female breast: Secondary | ICD-10-CM

## 2014-07-13 MED ORDER — AMOXICILLIN 500 MG PO CAPS: 500.0000 mg | ORAL_CAPSULE | Freq: Two times a day (BID) | ORAL | Status: DC

## 2014-07-14 ENCOUNTER — Ambulatory Visit: Payer: Medicare Other

## 2014-07-14 ENCOUNTER — Ambulatory Visit
Admission: RE | Admit: 2014-07-14 | Discharge: 2014-07-14 | Disposition: A | Discharge status: Home / Self Care | Payer: Medicare Other | Source: Ambulatory Visit | Attending: Radiation Oncology | Admitting: Radiation Oncology

## 2014-07-14 VITALS — HR 84 | Temp 98.9°F | Wt 126.2 lb

## 2014-07-14 DIAGNOSIS — C50911 Malignant neoplasm of unspecified site of right female breast: Secondary | ICD-10-CM

## 2014-07-14 DIAGNOSIS — C50912 Malignant neoplasm of unspecified site of left female breast: Principal | ICD-10-CM

## 2014-07-14 DIAGNOSIS — Z510 Encounter for antineoplastic radiation therapy: Secondary | ICD-10-CM | POA: Diagnosis not present

## 2014-07-14 NOTE — Progress Notes (Signed)
Weekly Management Note Current Dose: 19.8  Gy  Projected Dose: 61 Gy   Narrative:  The patient presents for routine under treatment assessment.  CBCT/MVCT images/Port film x-rays were reviewed.  The chart was checked.  Tired. On abx for sinus infection. No breast related complaints.   Physical Findings: Weight: 126 lb 3.2 oz (57.244 kg). Unchanged to slightly dark bilateral breasts.   Impression:  The patient is tolerating radiation.  Plan:  Continue treatment as planned. Continue radiaplex.

## 2014-07-14 NOTE — Progress Notes (Signed)
Weekly assessment of radiation to bilateral chest wall.Mild tanning of skin without peeling.Continue radiaplex  Twice daily.Started amoxicillin 500 mg bid on yesterday for sinus infection.chronic fatigue.

## 2014-07-15 ENCOUNTER — Ambulatory Visit: Payer: Medicare Other

## 2014-07-15 ENCOUNTER — Ambulatory Visit
Admission: RE | Admit: 2014-07-15 | Discharge: 2014-07-15 | Disposition: A | Discharge status: Home / Self Care | Payer: Medicare Other | Source: Ambulatory Visit | Attending: Radiation Oncology | Admitting: Radiation Oncology

## 2014-07-15 DIAGNOSIS — Z510 Encounter for antineoplastic radiation therapy: Secondary | ICD-10-CM | POA: Diagnosis not present

## 2014-07-16 ENCOUNTER — Ambulatory Visit: Payer: Medicare Other

## 2014-07-16 ENCOUNTER — Ambulatory Visit
Admission: RE | Admit: 2014-07-16 | Discharge: 2014-07-16 | Disposition: A | Discharge status: Home / Self Care | Payer: Medicare Other | Source: Ambulatory Visit | Attending: Radiation Oncology | Admitting: Radiation Oncology

## 2014-07-16 DIAGNOSIS — Z510 Encounter for antineoplastic radiation therapy: Secondary | ICD-10-CM | POA: Diagnosis not present

## 2014-07-17 ENCOUNTER — Telehealth: Payer: Self-pay

## 2014-07-17 ENCOUNTER — Ambulatory Visit
Admission: RE | Admit: 2014-07-17 | Discharge: 2014-07-17 | Disposition: A | Discharge status: Home / Self Care | Payer: Medicare Other | Source: Ambulatory Visit | Attending: Radiation Oncology | Admitting: Radiation Oncology

## 2014-07-17 ENCOUNTER — Ambulatory Visit: Payer: Medicare Other

## 2014-07-17 DIAGNOSIS — Z510 Encounter for antineoplastic radiation therapy: Secondary | ICD-10-CM | POA: Diagnosis not present

## 2014-07-17 NOTE — Telephone Encounter (Signed)
Pt LM requesting appt with Dr. Lindi Adie.  POF sent

## 2014-07-20 ENCOUNTER — Telehealth: Payer: Self-pay | Admitting: Hematology and Oncology

## 2014-07-20 ENCOUNTER — Ambulatory Visit: Payer: Medicare Other

## 2014-07-20 ENCOUNTER — Ambulatory Visit
Admission: RE | Admit: 2014-07-20 | Discharge: 2014-07-20 | Disposition: A | Discharge status: Home / Self Care | Payer: Medicare Other | Source: Ambulatory Visit | Attending: Radiation Oncology | Admitting: Radiation Oncology

## 2014-07-20 DIAGNOSIS — Z510 Encounter for antineoplastic radiation therapy: Secondary | ICD-10-CM | POA: Diagnosis not present

## 2014-07-20 NOTE — Telephone Encounter (Signed)
Per 12/18 POF, MD visit scheduled, no vm set up on pt's phone, will try to call back later...Marland KitchenMarland KitchenCherylann Banas, spk w/Karen in Radiation, took copy of schedule downstairs for pt to p/u at her radiation apt today... KJ

## 2014-07-21 ENCOUNTER — Ambulatory Visit
Admission: RE | Admit: 2014-07-21 | Discharge: 2014-07-21 | Disposition: A | Discharge status: Home / Self Care | Payer: Medicare Other | Source: Ambulatory Visit | Attending: Radiation Oncology | Admitting: Radiation Oncology

## 2014-07-21 ENCOUNTER — Ambulatory Visit: Payer: Medicare Other

## 2014-07-21 VITALS — BP 142/98 | HR 82 | Temp 98.3°F | Wt 127.1 lb

## 2014-07-21 DIAGNOSIS — Z510 Encounter for antineoplastic radiation therapy: Secondary | ICD-10-CM | POA: Diagnosis not present

## 2014-07-21 DIAGNOSIS — C50912 Malignant neoplasm of unspecified site of left female breast: Secondary | ICD-10-CM | POA: Diagnosis present

## 2014-07-21 DIAGNOSIS — C50911 Malignant neoplasm of unspecified site of right female breast: Secondary | ICD-10-CM | POA: Diagnosis present

## 2014-07-21 DIAGNOSIS — N6489 Other specified disorders of breast: Secondary | ICD-10-CM | POA: Insufficient documentation

## 2014-07-21 MED ORDER — RADIAPLEXRX EX GEL
Freq: Once | CUTANEOUS | Status: AC
  Administered 2014-07-21: 11:00:00 via TOPICAL

## 2014-07-21 NOTE — Addendum Note (Signed)
Encounter addended by: Arlyss Repress, RN on: 07/21/2014 11:08 AM<BR>     Documentation filed: Inpatient MAR

## 2014-07-21 NOTE — Progress Notes (Signed)
  Radiation Oncology         [phone]) [phone] ________________________________  Name: Paula Navarro MRN: [id number]  Date: 07/21/2014  DOB: [birth year]  Weekly Radiation Therapy Management  Bilateral breast cancer   Staging form: Breast, AJCC 7th Edition     Clinical: Stage IIB (T2, N1, cM0) - Unsigned       Staging comments: Staged at breast conference 08/13/13       Current Dose: 28.8 Gy     Planned Dose:  61 Gy  Narrative . . . . . . . . The patient presents for routine under treatment assessment.                                   The patient is without complaint except for some discomfort associated with her seroma in the right breast                                 Set-up films were reviewed.                                 The chart was checked. Physical Findings. . .  weight is 127 lb 1.6 oz (57.652 kg). Her temperature is 98.3 F (36.8 C). Her blood pressure is 142/98 and her pulse is 82. . The lungs are clear. The heart has a regular rhythm and rate. The breast area shows some mild hyperpigmentation changes but no skin breakdown. The patient has what appears to be a seroma in the upper aspect of the right breast. Impression . . . . . . . The patient is tolerating radiation. Plan . . . . . . . . . . . . Continue treatment as planned.  ________________________________   Blair Promise, PhD, MD

## 2014-07-21 NOTE — Progress Notes (Signed)
Weekly assessment of radiation to bilateral breast and supraclavicular region.palpable and movable mass in right breast is more dense and uncomfortable.Increased fatigue.

## 2014-07-22 ENCOUNTER — Ambulatory Visit
Admission: RE | Admit: 2014-07-22 | Discharge: 2014-07-22 | Disposition: A | Discharge status: Home / Self Care | Payer: Medicare Other | Source: Ambulatory Visit | Attending: Radiation Oncology | Admitting: Radiation Oncology

## 2014-07-22 ENCOUNTER — Ambulatory Visit: Payer: Medicare Other

## 2014-07-22 DIAGNOSIS — Z510 Encounter for antineoplastic radiation therapy: Secondary | ICD-10-CM | POA: Diagnosis not present

## 2014-07-23 ENCOUNTER — Ambulatory Visit (HOSPITAL_BASED_OUTPATIENT_CLINIC_OR_DEPARTMENT_OTHER): Payer: Medicare Other | Admitting: Hematology and Oncology

## 2014-07-23 ENCOUNTER — Ambulatory Visit: Payer: Medicare Other

## 2014-07-23 ENCOUNTER — Ambulatory Visit
Admission: RE | Admit: 2014-07-23 | Discharge: 2014-07-23 | Disposition: A | Discharge status: Home / Self Care | Payer: Medicare Other | Source: Ambulatory Visit | Attending: Radiation Oncology | Admitting: Radiation Oncology

## 2014-07-23 VITALS — BP 129/86 | HR 78 | Temp 98.3°F | Resp 18 | Ht 62.0 in | Wt 128.0 lb

## 2014-07-23 DIAGNOSIS — C50811 Malignant neoplasm of overlapping sites of right female breast: Secondary | ICD-10-CM

## 2014-07-23 DIAGNOSIS — C773 Secondary and unspecified malignant neoplasm of axilla and upper limb lymph nodes: Secondary | ICD-10-CM

## 2014-07-23 DIAGNOSIS — Z510 Encounter for antineoplastic radiation therapy: Secondary | ICD-10-CM | POA: Diagnosis not present

## 2014-07-23 DIAGNOSIS — C50911 Malignant neoplasm of unspecified site of right female breast: Secondary | ICD-10-CM

## 2014-07-23 DIAGNOSIS — C50912 Malignant neoplasm of unspecified site of left female breast: Principal | ICD-10-CM

## 2014-07-23 DIAGNOSIS — C50812 Malignant neoplasm of overlapping sites of left female breast: Secondary | ICD-10-CM

## 2014-07-23 DIAGNOSIS — Z170 Estrogen receptor positive status [ER+]: Secondary | ICD-10-CM

## 2014-07-23 MED ORDER — TRAMADOL HCL 50 MG PO TABS: 50.0000 mg | ORAL_TABLET | Freq: Two times a day (BID) | ORAL | Status: DC

## 2014-07-23 NOTE — Progress Notes (Signed)
Patient Care Team: Lanette Hampshire, MD as PCP - General (Family Medicine) [name], MD (Cardiology)  DIAGNOSIS: Bilateral breast cancer   Staging form: Breast, AJCC 7th Edition     Clinical: Stage IIB (T2, N1, cM0) - Unsigned       Staging comments: Staged at breast conference 08/13/13      Pathologic: No stage assigned - Unsigned   SUMMARY OF ONCOLOGIC HISTORY:   Bilateral breast cancer   07/23/2013 Mammogram Bilateral breast masses. 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 N1 M0 stage IIB ER 100%, PR 0%, HER-2     CHIEF COMPLIANT: On adjuvant radiation therapy  INTERVAL HISTORY: Paula Navarro is a 63 year old lady with above-mentioned history of bilateral breast cancer treated with neoadjuvant chemotherapy followed by surgery and is now on adjuvant radiation therapy. She reports that she is very fatigued after radiation and can sleep for 2-3 hours afterwards. She came in today to tell us that she will discontinue radiation treatment. She wanted to know  the rationale for treatment with radiation. Other than the fatigue she is otherwise feeling well. She is slightly sad because in 2023/07/16 her husband passed away. But she is excited and happy that Christmas is round the corner.  REVIEW OF SYSTEMS:   Constitutional: Denies fevers, chills or abnormal weight loss Eyes: Denies blurriness of vision Ears, nose, mouth, throat, and face: Denies mucositis or sore throat Respiratory: Denies cough, dyspnea or wheezes Cardiovascular: Denies palpitation, chest discomfort or lower extremity swelling Gastrointestinal:  Denies nausea, heartburn or change in bowel habits Skin: Denies abnormal skin rashes Lymphatics: Denies new lymphadenopathy or easy bruising Neurological:Denies numbness, tingling or new weaknesses Behavioral/Psych: Mood is stable, no new changes  Breast:  denies any pain or lumps or nodules in either breasts All other systems were reviewed with the patient and are negative.  I have reviewed the past medical history, past surgical history, social history and family history with the patient and they are unchanged from previous note.  ALLERGIES:  is allergic to codeine and morphine and related.  MEDICATIONS:  Current Outpatient Prescriptions  Medication Sig Dispense Refill  . amoxicillin (AMOXIL) 500 MG capsule Take 1 capsule (500 mg total) by mouth 2 (two) times daily. 20 capsule 0  . [START ON 08/04/2014] anastrozole (ARIMIDEX) 1 MG tablet Take 1 tablet (1 mg total) by mouth daily. 90 tablet 6  . B Complex-C (SUPER B COMPLEX PO) Take 1 tablet by mouth daily.    . clobetasol cream (TEMOVATE) 0.05 % Apply 1  application topically 2 (two) times daily. Apply to hands and feet twice a day as needed. 30 g 1  . gabapentin (NEURONTIN) 300 MG capsule Take 2 capsules (600 mg total) by mouth 3 (three) times daily. 90 capsule 0  . hyaluronate sodium (RADIAPLEXRX) GEL Apply 1 application topically 2 (two) times daily.    Marland Kitchen lidocaine-prilocaine (EMLA)  cream Apply 1 application topically as needed. 30 g 1  . loratadine (CLARITIN) 10 MG tablet Take 10 mg by mouth daily as needed for itching.    Marland Kitchen LORazepam (ATIVAN) 1 MG tablet Take 1 tablet (1 mg total) by mouth every 6 (six) hours as needed for anxiety. 30 tablet 0  . non-metallic deodorant (ALRA) MISC Apply 1 application topically daily as needed.    . polyethylene glycol (MIRALAX / GLYCOLAX) packet Take 17 g by mouth daily as needed for moderate constipation.    . traMADol (ULTRAM) 50 MG tablet Take 1 tablet (50 mg total) by mouth 2 (two) times daily. 30 tablet 0   No current facility-administered medications for this visit.    PHYSICAL EXAMINATION: ECOG PERFORMANCE STATUS: 1 - Symptomatic but completely ambulatory  Filed Vitals:   07/23/14 1105  BP: 129/86  Pulse: 78  Temp: 98.3 F (36.8 C)  Resp: 18   Filed Weights   07/23/14 1105  Weight: 128 lb (58.06 kg)    GENERAL:alert, no distress and comfortable SKIN: skin color, texture, turgor are normal, no rashes or significant lesions EYES: normal, Conjunctiva are pink and non-injected, sclera clear OROPHARYNX:no exudate, no erythema and lips, buccal mucosa, and tongue normal  NECK: supple, thyroid normal size, non-tender, without nodularity LYMPH:  no palpable lymphadenopathy in the cervical, axillary or inguinal LUNGS: clear to auscultation and percussion with normal breathing effort HEART: regular rate & rhythm and no murmurs and no lower extremity edema ABDOMEN:abdomen soft, non-tender and normal bowel sounds Musculoskeletal:no cyanosis of digits and no clubbing  NEURO: alert & oriented x 3 with fluent speech, no focal motor/sensory deficits  LABORATORY DATA:  I have reviewed the data as listed   Chemistry      Component Value Date/Time   NA 140 03/27/2014 1348   NA 141 09/23/2012 0414   K 4.2 03/27/2014 1348   K 3.8 09/23/2012 0414   CL 107 09/23/2012 0414   CO2 25 03/27/2014 1348   CO2 29 10/30/2011 1419   BUN  9.2 03/27/2014 1348   BUN 7 09/23/2012 0414   CREATININE 0.7 03/27/2014 1348   CREATININE 0.90 09/23/2012 0414      Component Value Date/Time   CALCIUM 9.8 03/27/2014 1348   CALCIUM 10.1 10/30/2011 1419   ALKPHOS 55 03/27/2014 1348   AST 32 03/27/2014 1348   ALT 31 03/27/2014 1348   BILITOT 0.85 03/27/2014 1348       Lab Results  Component Value Date   WBC 7.9 03/27/2014   HGB 11.6 03/27/2014   HCT 37.1 03/27/2014   MCV 72.7* 03/27/2014   PLT 268 03/27/2014   NEUTROABS 4.7 03/27/2014    ASSESSMENT & PLAN:  Bilateral breast cancer Bilateral breast cancers: Treated with neoadjuvant chemotherapy followed by surgery, currently receiving radiation treatment, antiestrogen therapy to follow  Left breast T2, N0, M0, IDC grade 3; 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 N1 M0 stage IIB  Radiation  toxicities: Severe fatigue. Patient came to [place] today saying that she would like to discontinue radiation therapy. I sat down with her and counseled her extensively the need for radiation treatment and she finally agreed to continue on with the treatment plan.  Once she completes radiation, she will start antiestrogen therapy. I would like to see her back in mid February to see if she is tolerating it well.    No orders of the defined types were placed in this encounter.   The patient has a good understanding of the overall plan. she agrees with it. She will call with any problems that may develop before her next visit here.   Rulon Eisenmenger, MD 07/23/2014 11:26 AM

## 2014-07-23 NOTE — Assessment & Plan Note (Addendum)
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 N1 M0 stage IIB  Radiation toxicities: Severe fatigue. Patient came to [place] today saying that she would like to discontinue radiation therapy. I sat down with her and counseled her extensively the need for radiation treatment and she finally agreed to continue on with the treatment plan.  Once she completes radiation, she will start antiestrogen therapy. I would like to see her back in mid February to see if she is tolerating it well.

## 2014-07-27 ENCOUNTER — Ambulatory Visit
Admission: RE | Admit: 2014-07-27 | Discharge: 2014-07-27 | Disposition: A | Discharge status: Home / Self Care | Payer: Medicare Other | Source: Ambulatory Visit | Attending: Radiation Oncology | Admitting: Radiation Oncology

## 2014-07-27 ENCOUNTER — Ambulatory Visit: Payer: Medicare Other

## 2014-07-27 DIAGNOSIS — Z510 Encounter for antineoplastic radiation therapy: Secondary | ICD-10-CM | POA: Diagnosis not present

## 2014-07-28 ENCOUNTER — Ambulatory Visit: Payer: Medicare Other

## 2014-07-28 ENCOUNTER — Ambulatory Visit
Admission: RE | Admit: 2014-07-28 | Discharge: 2014-07-28 | Disposition: A | Discharge status: Home / Self Care | Payer: Medicare Other | Source: Ambulatory Visit | Attending: Radiation Oncology | Admitting: Radiation Oncology

## 2014-07-28 ENCOUNTER — Ambulatory Visit: Payer: Medicare Other | Admitting: Radiation Oncology

## 2014-07-28 VITALS — BP 129/84 | HR 74 | Temp 97.8°F | Wt 131.7 lb

## 2014-07-28 DIAGNOSIS — C50911 Malignant neoplasm of unspecified site of right female breast: Secondary | ICD-10-CM

## 2014-07-28 DIAGNOSIS — Z510 Encounter for antineoplastic radiation therapy: Secondary | ICD-10-CM | POA: Diagnosis not present

## 2014-07-28 DIAGNOSIS — C50912 Malignant neoplasm of unspecified site of left female breast: Principal | ICD-10-CM

## 2014-07-28 NOTE — Progress Notes (Signed)
Weekly Management Note Current Dose: 36  Gy  Projected Dose: 61 Gy   Narrative:  The patient presents for routine under treatment assessment.  CBCT/MVCT images/Port film x-rays were reviewed.  The chart was checked. Doing well. Some nausea this am (ate 4 pieces of chocolate cake yesterday)  Physical Findings: Weight: 131 lb 11.2 oz (59.739 kg). Unchanged. Minimal skin darkening. Alert and oriented.  Impression:  The patient is tolerating radiation.  Plan:  Continue treatment as planned. Discussed tx for nausea and good eating habits.

## 2014-07-28 NOTE — Progress Notes (Signed)
Weekly assessment of radiation to bilateral breast.Denies pain.Mild tanning of skin without peeling .Generalized itching of skin all over which patient takes benadryl for.Has some mild nausea this morning.Given sprite.Increased fatigue.

## 2014-07-29 ENCOUNTER — Ambulatory Visit
Admission: RE | Admit: 2014-07-29 | Discharge: 2014-07-29 | Disposition: A | Discharge status: Home / Self Care | Payer: Medicare Other | Source: Ambulatory Visit | Attending: Radiation Oncology | Admitting: Radiation Oncology

## 2014-07-29 ENCOUNTER — Ambulatory Visit: Payer: Medicare Other

## 2014-07-29 ENCOUNTER — Encounter: Payer: Self-pay | Admitting: Radiation Oncology

## 2014-07-29 DIAGNOSIS — Z510 Encounter for antineoplastic radiation therapy: Secondary | ICD-10-CM | POA: Diagnosis not present

## 2014-07-29 NOTE — Progress Notes (Signed)
Name: Paula Navarro   MRN: [id number]  Date:  07/29/2014   DOB: [birth year]  Status:outpatient    DIAGNOSIS: left breast cancer  CONSENT VERIFIED: yes   SET UP: Patient is setup supine   IMMOBILIZATION:  The following immobilization was used:Custom Moldable Pillow, breast board.   NARRATIVE: [name] underwent complex simulation and treatment planning for her boost treatment today.  Her tumor volume was outlined on the planning CT scan. The depth of her cavity was felt to be appropriate for treatment with electrons    12 MeV electrons will be prescribed to the 100%  isodose line.   I personally oversaw and approved the construction of a unique block which will be used for beam modification purposes.  A special port plan is requested.

## 2014-07-30 ENCOUNTER — Ambulatory Visit
Admission: RE | Admit: 2014-07-30 | Discharge: 2014-07-30 | Disposition: A | Discharge status: Home / Self Care | Payer: Medicare Other | Source: Ambulatory Visit | Attending: Radiation Oncology | Admitting: Radiation Oncology

## 2014-07-30 ENCOUNTER — Ambulatory Visit: Payer: Medicare Other

## 2014-07-30 ENCOUNTER — Other Ambulatory Visit: Payer: Self-pay | Admitting: Hematology and Oncology

## 2014-07-30 DIAGNOSIS — Z510 Encounter for antineoplastic radiation therapy: Secondary | ICD-10-CM | POA: Diagnosis not present

## 2014-07-30 DIAGNOSIS — C50911 Malignant neoplasm of unspecified site of right female breast: Secondary | ICD-10-CM

## 2014-08-02 ENCOUNTER — Other Ambulatory Visit: Payer: Self-pay | Admitting: Hematology and Oncology

## 2014-08-03 ENCOUNTER — Telehealth: Payer: Self-pay | Admitting: Hematology and Oncology

## 2014-08-03 ENCOUNTER — Ambulatory Visit: Payer: Medicare Other

## 2014-08-03 ENCOUNTER — Telehealth: Payer: Self-pay | Admitting: *Deleted

## 2014-08-03 ENCOUNTER — Other Ambulatory Visit: Payer: Self-pay | Admitting: *Deleted

## 2014-08-03 ENCOUNTER — Ambulatory Visit
Admission: RE | Admit: 2014-08-03 | Discharge: 2014-08-03 | Disposition: A | Discharge status: Home / Self Care | Payer: Medicare Other | Source: Ambulatory Visit | Attending: Radiation Oncology | Admitting: Radiation Oncology

## 2014-08-03 DIAGNOSIS — C50911 Malignant neoplasm of unspecified site of right female breast: Secondary | ICD-10-CM

## 2014-08-03 DIAGNOSIS — Z510 Encounter for antineoplastic radiation therapy: Secondary | ICD-10-CM | POA: Diagnosis not present

## 2014-08-03 DIAGNOSIS — C50912 Malignant neoplasm of unspecified site of left female breast: Principal | ICD-10-CM

## 2014-08-03 MED ORDER — BIAFINE EX EMUL
CUTANEOUS | Status: DC | PRN
  Administered 2014-08-03: 10:00:00 via TOPICAL

## 2014-08-03 NOTE — Telephone Encounter (Signed)
Patient called to report that she is taking 600 mg of Gabapentin 3x/day and it is not helping. Discussed with Dr. Lindi Adie and patient to come to office to discuss further. pof sent to schedule patient for 1/6 at 11:00.

## 2014-08-03 NOTE — Telephone Encounter (Signed)
, °

## 2014-08-04 ENCOUNTER — Ambulatory Visit
Admission: RE | Admit: 2014-08-04 | Discharge: 2014-08-04 | Disposition: A | Discharge status: Home / Self Care | Payer: Medicare Other | Source: Ambulatory Visit | Attending: Radiation Oncology | Admitting: Radiation Oncology

## 2014-08-04 DIAGNOSIS — Z510 Encounter for antineoplastic radiation therapy: Secondary | ICD-10-CM | POA: Diagnosis not present

## 2014-08-04 DIAGNOSIS — C50912 Malignant neoplasm of unspecified site of left female breast: Secondary | ICD-10-CM

## 2014-08-04 DIAGNOSIS — C50911 Malignant neoplasm of unspecified site of right female breast: Secondary | ICD-10-CM

## 2014-08-04 NOTE — Progress Notes (Signed)
Weekly Management Note Current Dose:  43.2 Gy  Projected Dose: 61 Gy   Narrative:  The patient presents for routine under treatment assessment.  CBCT/MVCT images/Port film x-rays were reviewed.  The chart was checked. Doing well. Biafene made her itch. Seeing Gudena tomorrow for itching/hives possibly caused by medications.   Physical Findings: Weight:  . Unchanged. Minimal darkness of bilateral breasts  Impression:  The patient is tolerating radiation.  Plan:  Continue treatment as planned. Continue radiaplex.

## 2014-08-05 ENCOUNTER — Ambulatory Visit (HOSPITAL_BASED_OUTPATIENT_CLINIC_OR_DEPARTMENT_OTHER): Payer: Medicare Other | Admitting: Hematology and Oncology

## 2014-08-05 ENCOUNTER — Ambulatory Visit
Admission: RE | Admit: 2014-08-05 | Discharge: 2014-08-05 | Disposition: A | Discharge status: Home / Self Care | Payer: Medicare Other | Source: Ambulatory Visit | Attending: Radiation Oncology | Admitting: Radiation Oncology

## 2014-08-05 ENCOUNTER — Telehealth: Payer: Self-pay | Admitting: Hematology and Oncology

## 2014-08-05 VITALS — BP 118/77 | HR 81 | Temp 98.0°F | Resp 18 | Ht 62.0 in | Wt 128.9 lb

## 2014-08-05 DIAGNOSIS — Z510 Encounter for antineoplastic radiation therapy: Secondary | ICD-10-CM | POA: Diagnosis not present

## 2014-08-05 DIAGNOSIS — C50812 Malignant neoplasm of overlapping sites of left female breast: Secondary | ICD-10-CM

## 2014-08-05 DIAGNOSIS — C50911 Malignant neoplasm of unspecified site of right female breast: Secondary | ICD-10-CM

## 2014-08-05 DIAGNOSIS — G629 Polyneuropathy, unspecified: Secondary | ICD-10-CM

## 2014-08-05 DIAGNOSIS — C50811 Malignant neoplasm of overlapping sites of right female breast: Secondary | ICD-10-CM

## 2014-08-05 DIAGNOSIS — C773 Secondary and unspecified malignant neoplasm of axilla and upper limb lymph nodes: Secondary | ICD-10-CM

## 2014-08-05 DIAGNOSIS — C50912 Malignant neoplasm of unspecified site of left female breast: Principal | ICD-10-CM

## 2014-08-05 MED ORDER — GABAPENTIN 300 MG PO CAPS: 600.0000 mg | ORAL_CAPSULE | Freq: Three times a day (TID) | ORAL | Status: DC

## 2014-08-05 MED ORDER — OXYCODONE-ACETAMINOPHEN 5-325 MG PO TABS: 1.0000 | ORAL_TABLET | ORAL | Status: DC | PRN

## 2014-08-05 NOTE — Assessment & Plan Note (Signed)
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 N1 M0 stage IIB  Neuropathy: Patient is out of Neurontin and has persistent tingling numbness and pain in her hands and feet. She reports that Ultram had caused her to get hives and does not want to get it anymore. She had previously responded very well to half a tablet of Percocet. She would requesting if we could prescribe her Percocets today. The neuropathy is limiting her activities and causing pain and discomfort. Hence we renewed her Percocet. I discontinued Ultram.  Patient will start radiation boost from tomorrow and it will be completed in 8 days. I instructed her to start antiestrogen therapy with anastrozole mid February. I would like to see her back in 3 months for follow-up evaluation.

## 2014-08-05 NOTE — Telephone Encounter (Signed)
, °

## 2014-08-05 NOTE — Progress Notes (Signed)
Patient Care Team: Lanette Hampshire, MD as PCP - General (Family Medicine) [name], MD (Cardiology)  DIAGNOSIS: Bilateral breast cancer   Staging form: Breast, AJCC 7th Edition     Clinical: Stage IIB (T2, N1, cM0) - Unsigned       Staging comments: Staged at breast conference 08/13/13      Pathologic: No stage assigned - Unsigned   SUMMARY OF ONCOLOGIC HISTORY:   Bilateral breast cancer   07/23/2013 Mammogram Bilateral breast masses. 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 She had called in complaining of severe pain in her hands and feet and that she ran out of Neurontin. She is also  received Ultram previously which caused her to get hives on her thighs. It is not very clear that it was Ultram that caused it or was something else. But she does not want take them anymore. Previously she had taken Percocets which aren't markedly helped her pain symptoms in her hands and feet. She has been applying cortisone cream to the hives which has improved significantly.  REVIEW OF SYSTEMS:   Constitutional: Denies fevers, chills or abnormal weight loss Eyes: Denies blurriness of vision Ears, nose, mouth, throat, and face: Denies mucositis or sore throat Respiratory: Denies cough, dyspnea or wheezes Cardiovascular: Denies palpitation, chest discomfort or lower extremity swelling Gastrointestinal:  Denies nausea, heartburn or change in bowel habits Skin: Denies abnormal skin rashes Lymphatics: Denies new lymphadenopathy or easy bruising Neurological: Peripheral sensory neuropathy Behavioral/Psych: Mood is stable, no new changes  Breast:  denies any pain or lumps or nodules in either breasts All other systems were reviewed with the patient and are negative.  I have reviewed the past medical history, past surgical history, social history and family history with the patient and they are unchanged from previous note.  ALLERGIES:  is allergic to codeine and morphine and related.  MEDICATIONS:  Current Outpatient Prescriptions  Medication Sig Dispense Refill  . anastrozole (ARIMIDEX) 1 MG tablet Take 1 tablet (1 mg total) by mouth daily. 90 tablet 6  . B Complex-C (SUPER B COMPLEX PO) Take 1 tablet by mouth daily.    . clobetasol cream (TEMOVATE) 0.05 %  Apply 1 application topically 2 (two) times daily. Apply to hands and feet twice a day as needed. 30 g 1  . emollient (BIAFINE) cream Apply 1 application topically as needed.    . gabapentin (NEURONTIN) 300 MG capsule Take 2 capsules (600 mg total) by mouth 3 (three) times daily. 90 capsule 0  . hyaluronate sodium (RADIAPLEXRX) GEL Apply 1  application topically 2 (two) times daily.    Marland Kitchen lidocaine-prilocaine (EMLA) cream Apply 1 application topically as needed. 30 g 1  . loratadine (CLARITIN) 10 MG tablet Take 10 mg by mouth daily as needed for itching.    Marland Kitchen LORazepam (ATIVAN) 1 MG tablet TAKE 1 TABLET BY MOUTH EVERY 6 HOURS AS NEEDED 30 tablet 0  . non-metallic deodorant (ALRA) MISC Apply 1 application topically daily as needed.    Marland Kitchen oxyCODONE-acetaminophen (PERCOCET/ROXICET) 5-325 MG per tablet Take 1 tablet by mouth every 4 (four) hours as needed for severe pain. 50 tablet 0  . polyethylene glycol (MIRALAX / GLYCOLAX) packet Take 17 g by mouth daily as needed for moderate constipation.     No current facility-administered medications for this visit.    PHYSICAL EXAMINATION: ECOG PERFORMANCE STATUS: 1 - Symptomatic but completely ambulatory  Filed Vitals:   08/05/14 1047  BP: 118/77  Pulse: 81  Temp: 98 F (36.7 C)  Resp: 18   Filed Weights   08/05/14 1047  Weight: 128 lb 14.4 oz (58.469 kg)    GENERAL:alert, no distress and comfortable SKIN: skin color, texture, turgor are normal, no rashes or significant lesions EYES: normal, Conjunctiva are pink and non-injected, sclera clear OROPHARYNX:no exudate, no erythema and lips, buccal mucosa, and tongue normal  NECK: supple, thyroid normal size, non-tender, without nodularity LYMPH:  no palpable lymphadenopathy in the cervical, axillary or inguinal LUNGS: clear to auscultation and percussion with normal breathing effort HEART: regular rate & rhythm and no murmurs and no lower extremity edema ABDOMEN:abdomen soft, non-tender and normal bowel sounds Musculoskeletal:no cyanosis of digits and no clubbing  NEURO: alert & oriented x 3 with fluent speech, grade 2 peripheral sensory neuropathy   LABORATORY DATA:  I have reviewed the data as listed   Chemistry      Component Value Date/Time   NA 140 03/27/2014 1348   NA 141 09/23/2012 0414   K 4.2 03/27/2014 1348   K  3.8 09/23/2012 0414   CL 107 09/23/2012 0414   CO2 25 03/27/2014 1348   CO2 29 10/30/2011 1419   BUN 9.2 03/27/2014 1348   BUN 7 09/23/2012 0414   CREATININE 0.7 03/27/2014 1348   CREATININE 0.90 09/23/2012 0414      Component Value Date/Time   CALCIUM 9.8 03/27/2014 1348   CALCIUM 10.1 10/30/2011 1419   ALKPHOS 55 03/27/2014 1348   AST 32 03/27/2014 1348   ALT 31 03/27/2014 1348   BILITOT 0.85 03/27/2014 1348       Lab Results  Component Value Date   WBC 7.9 03/27/2014   HGB 11.6 03/27/2014   HCT 37.1 03/27/2014   MCV 72.7* 03/27/2014   PLT 268 03/27/2014   NEUTROABS 4.7 03/27/2014   ASSESSMENT & PLAN:  Bilateral breast cancer Bilateral breast cancers: Treated with neoadjuvant chemotherapy followed by surgery, currently receiving radiation treatment, antiestrogen therapy to follow  Left breast T2, N0, M0, IDC grade 3; 2.1 cm with high-grade DCIS 0/16 LN, ER 6%, PR 0%, HER-2 negative Right breast invasive ductal carcinoma grade 3; 1.8 cm with high-grade DCIS 1/11 lymph nodes  positive ER 100% PR 0% HER-2 negative T1 C. N1 M0 stage IIB  Neuropathy: Patient is out of Neurontin and has persistent tingling numbness and pain in her hands and feet. She reports that Ultram had caused her to get hives and does not want to get it anymore. She had previously responded very well to half a tablet of Percocet. She would requesting if we could prescribe her Percocets today. The neuropathy is limiting her activities and causing pain and discomfort. Hence we renewed her Percocet. I discontinued Ultram.  Patient will start radiation boost from tomorrow and it will be completed in 8 days. I instructed her to start antiestrogen therapy with anastrozole mid February. I would like to see her back in 3 months for follow-up evaluation.    Orders Placed This Encounter  Procedures  . CBC with Differential    Standing Status: Future     Number of Occurrences:      Standing Expiration Date:  08/05/2015  . Comprehensive metabolic panel (Cmet) - [facility]    Standing Status: Future     Number of Occurrences:      Standing Expiration Date: 08/05/2015   The patient has a good understanding of the overall plan. she agrees with it. She will call with any problems that may develop before her next visit here.   Rulon Eisenmenger, MD 08/05/2014 11:16 AM

## 2014-08-06 ENCOUNTER — Ambulatory Visit
Admission: RE | Admit: 2014-08-06 | Discharge: 2014-08-06 | Disposition: A | Discharge status: Home / Self Care | Payer: Medicare Other | Source: Ambulatory Visit | Attending: Radiation Oncology | Admitting: Radiation Oncology

## 2014-08-06 ENCOUNTER — Other Ambulatory Visit: Payer: Self-pay | Admitting: Hematology and Oncology

## 2014-08-06 DIAGNOSIS — Z510 Encounter for antineoplastic radiation therapy: Secondary | ICD-10-CM | POA: Diagnosis not present

## 2014-08-07 ENCOUNTER — Ambulatory Visit
Admission: RE | Admit: 2014-08-07 | Discharge: 2014-08-07 | Disposition: A | Discharge status: Home / Self Care | Payer: Medicare Other | Source: Ambulatory Visit | Attending: Radiation Oncology | Admitting: Radiation Oncology

## 2014-08-07 DIAGNOSIS — Z510 Encounter for antineoplastic radiation therapy: Secondary | ICD-10-CM | POA: Diagnosis not present

## 2014-08-07 DIAGNOSIS — C50912 Malignant neoplasm of unspecified site of left female breast: Principal | ICD-10-CM

## 2014-08-07 DIAGNOSIS — C50911 Malignant neoplasm of unspecified site of right female breast: Secondary | ICD-10-CM

## 2014-08-07 MED ORDER — RADIAPLEXRX EX GEL
Freq: Once | CUTANEOUS | Status: AC
  Administered 2014-08-07: 11:00:00 via TOPICAL

## 2014-08-10 ENCOUNTER — Ambulatory Visit
Admission: RE | Admit: 2014-08-10 | Discharge: 2014-08-10 | Disposition: A | Discharge status: Home / Self Care | Payer: Medicare Other | Source: Ambulatory Visit | Attending: Radiation Oncology | Admitting: Radiation Oncology

## 2014-08-10 DIAGNOSIS — Z510 Encounter for antineoplastic radiation therapy: Secondary | ICD-10-CM | POA: Diagnosis not present

## 2014-08-10 NOTE — Progress Notes (Signed)
Patient over for assessment intermittent pain of left shoulder to elbow.States it changes with position.when asked if she took her percocet to relieve pain she stated no she hadn't taken it since Friday.Patient will try advil during the day and percocet at bedtime and be reassessed tomorrow as she has her 45 year mother with her today.I told her to have another family member come with her tomorrow in the event she needs further tests so her mother will not be alone.She agreed and stated she will have her son come with them if her pain is unrelieved.There is no visible swelling or skin changes on arm or shoulder.She has had numbness of her hands and arms since end of chemotherapy treatment.

## 2014-08-11 ENCOUNTER — Ambulatory Visit
Admission: RE | Admit: 2014-08-11 | Discharge: 2014-08-11 | Disposition: A | Discharge status: Home / Self Care | Payer: Medicare Other | Source: Ambulatory Visit | Attending: Radiation Oncology | Admitting: Radiation Oncology

## 2014-08-11 ENCOUNTER — Telehealth: Payer: Self-pay

## 2014-08-11 VITALS — BP 118/80 | HR 83 | Temp 98.6°F | Wt 130.1 lb

## 2014-08-11 DIAGNOSIS — Z510 Encounter for antineoplastic radiation therapy: Secondary | ICD-10-CM | POA: Diagnosis not present

## 2014-08-11 DIAGNOSIS — C50912 Malignant neoplasm of unspecified site of left female breast: Principal | ICD-10-CM

## 2014-08-11 DIAGNOSIS — C50911 Malignant neoplasm of unspecified site of right female breast: Secondary | ICD-10-CM

## 2014-08-11 MED ORDER — LORAZEPAM 1 MG PO TABS: 1.0000 mg | ORAL_TABLET | Freq: Four times a day (QID) | ORAL | Status: DC | PRN

## 2014-08-11 NOTE — Telephone Encounter (Signed)
Lorazepam 1 mg q6h as needed called into [facility] [place] [phone].

## 2014-08-11 NOTE — Progress Notes (Signed)
  Radiation Oncology         [phone] ________________________________  Name: Paula Navarro MRN: [id number]  Date: 08/11/2014  DOB: 1951-02-08  Weekly Radiation Therapy Management  Diagnosis: bilateral breast cancer  Current Dose: 53 Gy     Planned Dose:  61 Gy  Narrative . . . . . . . . The patient presents for routine under treatment assessment.                                   The patient is without complaint except for some soreness along the left shoulder and arm. She has been taking Advil for this issue which is helped significantly.                                 Set-up films were reviewed.                                 The chart was checked. Physical Findings. . .  weight is 130 lb 1.6 oz (59.013 kg). Her temperature is 98.6 F (37 C). Her blood pressure is 118/80 and her pulse is 83.  the lungs are clear. The heart has a regular rhythm and rate. Both breast area shows hyperpigmentation changes but no skin breakdown. Palpation along the left shoulder and arm reveals no areas of point tenderness. No palpable cords within the left upper arm. Impression . . . . . . . The patient is tolerating radiation. Plan . . . . . . . . . . . . Continue treatment as planned.  ________________________________   Blair Promise, PhD, MD

## 2014-08-11 NOTE — Progress Notes (Signed)
Weekly assessment of radiation to bilateral breast.Completed 29 treatments.Skin with increased discoloration.Continue radiaplex .No longer having pain arm pain states the advil worked.

## 2014-08-12 ENCOUNTER — Ambulatory Visit
Admission: RE | Admit: 2014-08-12 | Discharge: 2014-08-12 | Disposition: A | Discharge status: Home / Self Care | Payer: Medicare Other | Source: Ambulatory Visit | Attending: Radiation Oncology | Admitting: Radiation Oncology

## 2014-08-12 DIAGNOSIS — Z510 Encounter for antineoplastic radiation therapy: Secondary | ICD-10-CM | POA: Diagnosis not present

## 2014-08-13 ENCOUNTER — Ambulatory Visit
Admission: RE | Admit: 2014-08-13 | Discharge: 2014-08-13 | Disposition: A | Discharge status: Home / Self Care | Payer: Medicare Other | Source: Ambulatory Visit | Attending: Radiation Oncology | Admitting: Radiation Oncology

## 2014-08-13 DIAGNOSIS — Z510 Encounter for antineoplastic radiation therapy: Secondary | ICD-10-CM | POA: Diagnosis not present

## 2014-08-14 ENCOUNTER — Ambulatory Visit
Admission: RE | Admit: 2014-08-14 | Discharge: 2014-08-14 | Disposition: A | Discharge status: Home / Self Care | Payer: Medicare Other | Source: Ambulatory Visit | Attending: Radiation Oncology | Admitting: Radiation Oncology

## 2014-08-14 ENCOUNTER — Encounter: Payer: Self-pay | Admitting: Radiation Oncology

## 2014-08-14 VITALS — BP 138/87 | HR 86 | Temp 98.1°F | Resp 18 | Wt 129.9 lb

## 2014-08-14 DIAGNOSIS — Z510 Encounter for antineoplastic radiation therapy: Secondary | ICD-10-CM | POA: Diagnosis not present

## 2014-08-14 DIAGNOSIS — C50912 Malignant neoplasm of unspecified site of left female breast: Principal | ICD-10-CM

## 2014-08-14 DIAGNOSIS — B029 Zoster without complications: Secondary | ICD-10-CM

## 2014-08-14 DIAGNOSIS — C50911 Malignant neoplasm of unspecified site of right female breast: Secondary | ICD-10-CM

## 2014-08-14 MED ORDER — ACYCLOVIR 200 MG PO CAPS: 200.0000 mg | ORAL_CAPSULE | Freq: Every day | ORAL | Status: DC

## 2014-08-14 NOTE — Progress Notes (Signed)
Patient has one more treatment of bilateral breast .Here on-going pain of left arm which started on Sunday of this week.Patient did have some improvement initially with advil and tramadol but experienced shooting pain all through the night without much relief.States pain is now from neck down to elbow.There is a raised rash without itching on left  arm that just appeared in the last 24 hours.

## 2014-08-14 NOTE — Progress Notes (Signed)
  Radiation Oncology         [phone] ________________________________  Name: Paula Navarro MRN: [id number]  Date: 08/14/2014  DOB: [birth year]  Weekly Radiation Therapy Management    ICD-9-CM ICD-10-CM   1. Bilateral breast cancer 174.9 C50.911     C50.912   2. Suspected herpes zoster left C5 distribution 053.9 B02.9     Current Dose: 59 Gy     Planned Dose:  60 Gy  Narrative . . . . . . . . The patient presents for routine under treatment assessment.                                  Patient has one more treatment of bilateral breast .Here on-going pain of left arm which started on Sunday of this week.Patient did have some improvement initially with advil and tramadol but experienced shooting pain all through the night without much relief.States pain is now from neck down to elbow.There is a raised rash without itching on left  arm that just appeared in the last 24 hours.                                 Set-up films were reviewed.                                 The chart was checked. Physical Findings. . .  weight is 129 lb 14.4 oz (58.922 kg). Her temperature is 98.1 F (36.7 C). Her blood pressure is 138/87 and her pulse is 86. Her respiration is 18 and oxygen saturation is 99%. . She has small erytematous clusters of rash in the left arm in a C5 dermatome. Weight essentially stable.  No significant changes. Impression . . . . . . . The patient is tolerating radiation.  I suspect Zoster. Plan . . . . . . . . . . . . Continue treatment as planned.  Start Acyclovir.  Continue Gabapentin.  ________________________________  Sheral Apley Tammi Klippel, M.D.

## 2014-08-17 ENCOUNTER — Encounter: Payer: Self-pay | Admitting: Radiation Oncology

## 2014-08-17 ENCOUNTER — Ambulatory Visit
Admission: RE | Admit: 2014-08-17 | Discharge: 2014-08-17 | Disposition: A | Discharge status: Home / Self Care | Payer: Medicare Other | Source: Ambulatory Visit | Attending: Radiation Oncology | Admitting: Radiation Oncology

## 2014-08-17 DIAGNOSIS — Z510 Encounter for antineoplastic radiation therapy: Secondary | ICD-10-CM | POA: Diagnosis not present

## 2014-08-17 DIAGNOSIS — C50912 Malignant neoplasm of unspecified site of left female breast: Principal | ICD-10-CM

## 2014-08-17 DIAGNOSIS — C50911 Malignant neoplasm of unspecified site of right female breast: Secondary | ICD-10-CM

## 2014-08-17 MED ORDER — BIAFINE EX EMUL
CUTANEOUS | Status: DC | PRN
  Administered 2014-08-17: 12:00:00 via TOPICAL

## 2014-08-18 NOTE — Progress Notes (Signed)
  Radiation Oncology         [phone] ________________________________  Name: Paula Navarro MRN: [id number]  Date: 08/17/2014  DOB: 12-Dec-1950  End of Treatment Note  Diagnosis:   T2N1 Left breast cancer (ypT2N0), T2N0 Right breast cancer (ypT1cN1)  Indication for treatment:  Curative    Radiation treatment dates:   06/30/2014-08/17/2014  Site/dose:   Left breast/ 45 Gy at 1.8 Gy per fraction x 25 fractions.  Left supraclavicular fossa/ 45 Gy at 1.8 Gy per fraction x 25 fractions Left breast boost/ 16 Gy at 2 Gy per fraction x 8 fractions Right breast / 45 Gray @ 1.8 Pearline Cables per fraction x 25 fractions Right supraclavicular fossa / 45 Gy @1 .8 Gy per fraction x 25 fractions Right breast boost / 16 Gray at [employer] per fraction x 8 fractions  Beams/energy: Opposed Tangents / 6 MV photons RAO / 10 MV photons Enface electrons / 12 MeV electrons Opposed tangents with reduced fields / 6 MV photons LAO/ 10 MV photons 3 field / 6 and 10 MV photons  Narrative: The patient tolerated radiation treatment relatively well.   She had hyperpigmentation of the bilateral breasts and developed some shoulder soreness.   Plan: The patient has completed radiation treatment. The patient will return to radiation oncology clinic for routine followup in one month. I advised them to call or return sooner if they have any questions or concerns related to their recovery or treatment.  ------------------------------------------------  Thea Silversmith, MD

## 2014-08-19 ENCOUNTER — Ambulatory Visit (HOSPITAL_BASED_OUTPATIENT_CLINIC_OR_DEPARTMENT_OTHER): Payer: Medicare Other

## 2014-08-19 VITALS — BP 129/78 | HR 81 | Temp 98.3°F

## 2014-08-19 DIAGNOSIS — C50811 Malignant neoplasm of overlapping sites of right female breast: Secondary | ICD-10-CM

## 2014-08-19 DIAGNOSIS — Z452 Encounter for adjustment and management of vascular access device: Secondary | ICD-10-CM

## 2014-08-19 DIAGNOSIS — Z95828 Presence of other vascular implants and grafts: Secondary | ICD-10-CM

## 2014-08-19 DIAGNOSIS — C50812 Malignant neoplasm of overlapping sites of left female breast: Secondary | ICD-10-CM

## 2014-08-19 DIAGNOSIS — C773 Secondary and unspecified malignant neoplasm of axilla and upper limb lymph nodes: Secondary | ICD-10-CM

## 2014-08-19 MED ORDER — SODIUM CHLORIDE 0.9 % IJ SOLN
10.0000 mL | INTRAMUSCULAR | Status: DC | PRN
  Administered 2014-08-19: 10 mL via INTRAVENOUS
  Filled 2014-08-19: qty 10

## 2014-08-19 MED ORDER — HEPARIN SOD (PORK) LOCK FLUSH 100 UNIT/ML IV SOLN
500.0000 [IU] | Freq: Once | INTRAVENOUS | Status: AC
  Administered 2014-08-19: 500 [IU] via INTRAVENOUS
  Filled 2014-08-19: qty 5

## 2014-08-19 NOTE — Patient Instructions (Signed)

## 2014-08-21 ENCOUNTER — Other Ambulatory Visit: Payer: Self-pay | Admitting: Hematology and Oncology

## 2014-08-21 DIAGNOSIS — C50911 Malignant neoplasm of unspecified site of right female breast: Secondary | ICD-10-CM

## 2014-08-21 NOTE — Progress Notes (Signed)
Name: Paula Navarro   MRN: [id number]  Date: 05/20/14   DOB: 1951/04/06  Status:outpatient    DIAGNOSIS: breast cancer  CONSENT VERIFIED: yes   SET UP: Patient is setup supine   IMMOBILIZATION:  The following immobilization was used:Custom Moldable Pillow, breast board.   NARRATIVE: Paula Navarro underwent complex simulation and treatment planning for her boost treatment today.  Her tumor volume was outlined on the planning CT scan.  Due to the depth of her cavity, electrons could not be used and a photon plan was developed. The plan will be prescribed to the 99% isodose line.   I personally supervised and approved the creation of 3 unique MLCs comprising 3 treatment devices.

## 2014-08-23 ENCOUNTER — Other Ambulatory Visit: Payer: Self-pay | Admitting: Hematology and Oncology

## 2014-08-26 ENCOUNTER — Telehealth: Payer: Self-pay

## 2014-08-26 NOTE — Telephone Encounter (Signed)
Patient called to state she is still having arm pain from shingles.Informed her that even though she has finished the acyclovir she could have pain much longer so to continue her gabapentin and either take tramadol or morphine for pain, if no relief in a few days she will call me back.Patient thankful for advice.

## 2014-09-01 ENCOUNTER — Telehealth: Payer: Self-pay | Admitting: Hematology and Oncology

## 2014-09-01 NOTE — Telephone Encounter (Signed)
, °

## 2014-09-02 ENCOUNTER — Telehealth: Payer: Self-pay

## 2014-09-02 NOTE — Telephone Encounter (Signed)
Pt called with sharp L arm pain for several weeks. She also has shingles on the L arm. She is being fitted for lymphedema sleeves tomorrow. The L shoulder is slightly swollen and warm, not red. No fever. Finished XRT to bilat breast on 08/17/14. The pain this am was 10/10. Took 1/2 percocet and the other 1/2 2 hours later. She does not like pain meds. She has an rx from Dr Marlou Starks of morphine. I explained that she should not take at the same time. She needs to separate them for about 4-6 hours. S/w [place], an appt in am is OK. Called pt back to come in at 0900.

## 2014-09-03 ENCOUNTER — Other Ambulatory Visit: Payer: Self-pay

## 2014-09-03 ENCOUNTER — Encounter: Payer: Self-pay | Admitting: Nurse Practitioner

## 2014-09-03 ENCOUNTER — Ambulatory Visit (HOSPITAL_BASED_OUTPATIENT_CLINIC_OR_DEPARTMENT_OTHER): Payer: Medicare Other | Admitting: Nurse Practitioner

## 2014-09-03 VITALS — BP 130/88 | HR 71 | Temp 98.9°F | Wt 130.4 lb

## 2014-09-03 DIAGNOSIS — C50912 Malignant neoplasm of unspecified site of left female breast: Principal | ICD-10-CM

## 2014-09-03 DIAGNOSIS — C773 Secondary and unspecified malignant neoplasm of axilla and upper limb lymph nodes: Secondary | ICD-10-CM

## 2014-09-03 DIAGNOSIS — B0229 Other postherpetic nervous system involvement: Secondary | ICD-10-CM

## 2014-09-03 DIAGNOSIS — C50811 Malignant neoplasm of overlapping sites of right female breast: Secondary | ICD-10-CM

## 2014-09-03 DIAGNOSIS — C50911 Malignant neoplasm of unspecified site of right female breast: Secondary | ICD-10-CM

## 2014-09-03 DIAGNOSIS — I890 Lymphedema, not elsewhere classified: Secondary | ICD-10-CM

## 2014-09-03 DIAGNOSIS — C50812 Malignant neoplasm of overlapping sites of left female breast: Secondary | ICD-10-CM

## 2014-09-03 MED ORDER — ACYCLOVIR 200 MG PO CAPS: 200.0000 mg | ORAL_CAPSULE | Freq: Every day | ORAL | Status: DC

## 2014-09-03 NOTE — Progress Notes (Signed)
SYMPTOM MANAGEMENT CLINIC   HPI: Paula Navarro 64 y.o. female diagnosed with bilateral breast cancer.  Patient is status post neoadjuvant chemotherapy, bilateral lumpectomies, and radiation therapy.  Patient plans to initiate anastrozole oral therapy in mid February 2016.  Patient called the cancer and this morning requesting an urgent care visit.  Patient was recently diagnosed with shingles; and states that she has completed the cycle.  She was previously prescribed.  She continues to complain of some significant left upper extremity pain at her previous shingles rash.  Patient states that she has been taking oxycodone; but it has been minimally effective.  Patient states she has some leftover morphine immediate release 15 mg tablets; and she has plans to initiate the morphine tablets this evening.  She also continues to take Neurontin as previously directed.  Also, patient does have some trace bilateral upper extremity lymphedema; but states she has plans to pick up her bilateral lymphedema sleeves later today.  Patient denies any chest pain, chest pressure, shortness breath, or pain with inspiration.  She denies any other new symptoms whatsoever.  She denies any recent fevers or chills.   HPI  ROS  Past Medical History  Diagnosis Date  . Chronic bronchitis   . Palpitation     Tachycardia reported by monitor clerk during a symptomatic spell  . Chest pain     Admitted to [facility] in 09/2011; refused stress test  . Atrial septal defect 1996    Surgical repair in 1996  . Tobacco abuse     60 pack years; 1.5 packs per day  . Anxiety   . Anemia   . Breast cancer   . Wears dentures     top  . COPD (chronic obstructive pulmonary disease)     on xray  . Chronic pain     Past Surgical History  Procedure Laterality Date  . Cholecystectomy    . Cesarean section      x3  . Breast biopsy Bilateral   . Tubal ligation    . Asd repair, ostium primum  1996    dr Roxy Horseman  . Port a cath  revision  1/15    put in   . Breast lumpectomy with radioactive seed localization Bilateral 04/08/2014    Procedure: BILATERAL  RADIOACTIVE SEED LOCALIZATION LUMPECTOMY ;  Surgeon: Autumn Messing III, MD;  Location: [place];  Service: General;  Laterality: Bilateral;  . Axillary lymph node dissection Bilateral 04/08/2014    Procedure:  BILATERAL AXILLARY LYMPH NODE DISSECTION;  Surgeon: Autumn Messing III, MD;  Location: [place];  Service: General;  Laterality: Bilateral;    has Chronic bronchitis; Tobacco abuse; Laboratory test; Palpitation; Chest pain; Atrial septal defect; Bilateral breast cancer; Neuropathy due to chemotherapeutic drug; Hand foot syndrome; Anxiety; Suspected herpes zoster left C5 distribution; Postherpetic neuralgia; and Lymphedema on her problem list.    is allergic to codeine and morphine and related.    Medication List       This list is accurate as of: 09/03/14  4:02 PM.  Always use your most recent med list.               acyclovir 200 MG capsule  Commonly known as:  ZOVIRAX  Take 1 capsule (200 mg total) by mouth 5 (five) times daily. Take 1 tablet 5 times a day for 10 days.     anastrozole 1 MG tablet  Commonly known as:  ARIMIDEX  Take 1 tablet (  1 mg total) by mouth daily.     clobetasol cream 0.05 %  Commonly known as:  TEMOVATE  Apply 1 application topically 2 (two) times daily. Apply to hands and feet twice a day as needed.     gabapentin 300 MG capsule  Commonly known as:  NEURONTIN  TAKE 2 CAPSULES (600 MG TOTAL) BY MOUTH 3 (THREE) TIMES DAILY.     ibuprofen 200 MG tablet  Commonly known as:  ADVIL,MOTRIN  Take 200 mg by mouth every 6 (six) hours as needed.     lidocaine-prilocaine cream  Commonly known as:  EMLA  Apply 1 application topically as needed.     loratadine 10 MG tablet  Commonly known as:  CLARITIN  Take 10 mg by mouth daily as needed for itching.     LORazepam 1 MG tablet  Commonly known as:  ATIVAN   Take 1 tablet (1 mg total) by mouth every 6 (six) hours as needed.     oxyCODONE-acetaminophen 5-325 MG per tablet  Commonly known as:  PERCOCET/ROXICET  Take 1 tablet by mouth every 4 (four) hours as needed for severe pain.     polyethylene glycol packet  Commonly known as:  MIRALAX / GLYCOLAX  Take 17 g by mouth daily as needed for moderate constipation.     SUPER B COMPLEX PO  Take 1 tablet by mouth daily.     traMADol 50 MG tablet  Commonly known as:  ULTRAM         PHYSICAL EXAMINATION  Blood pressure 130/88, pulse 71, temperature 98.9 F (37.2 C), temperature source Oral, weight 130 lb 6.4 oz (59.149 kg), last menstrual period 05/20/2011, SpO2 99 %.  Physical Exam  Constitutional: She is oriented to person, place, and time and well-developed, well-nourished, and in no distress.  HENT:  Head: Normocephalic and atraumatic.  Mouth/Throat: Oropharynx is clear and moist.  Eyes: Conjunctivae and EOM are normal. Pupils are equal, round, and reactive to light. Right eye exhibits no discharge. Left eye exhibits no discharge. No scleral icterus.  Neck: Normal range of motion. Neck supple. No JVD present. No tracheal deviation present. No thyromegaly present.  Cardiovascular: Normal rate, regular rhythm, normal heart sounds and intact distal pulses.   Pulmonary/Chest: Effort normal and breath sounds normal. No respiratory distress. She has no wheezes. She has no rales. She exhibits no tenderness.  Abdominal: Soft. Bowel sounds are normal. She exhibits no distension and no mass. There is no tenderness. There is no rebound and no guarding.  Musculoskeletal: Normal range of motion. She exhibits edema and tenderness.  Bilateral upper extremity trace lymphedema.  Lymphadenopathy:    She has no cervical adenopathy.  Neurological: She is alert and oriented to person, place, and time. Gait normal.  Skin: Skin is warm and dry.  Left upper extremity shingles rash does appear to have  cleared; but  still with trace remnants of healing rash.  Patient does continue with significant pain to previous rash site.  Psychiatric: Affect normal.  Nursing note and vitals reviewed.   LABORATORY DATA:. No visits with results within 3 Day(s) from this visit. Latest known visit with results is:  Admission on 04/20/2014, Discharged on 04/20/2014  Component Date Value Ref Range Status  . Color, Urine 04/20/2014 YELLOW  YELLOW Final  . APPearance 04/20/2014 CLEAR  CLEAR Final  . Specific Gravity, Urine 04/20/2014 <1.005* 1.005 - 1.030 Final  . pH 04/20/2014 6.0  5.0 - 8.0 Final  . Glucose, UA 04/20/2014 NEGATIVE  NEGATIVE mg/dL Final  . Hgb urine dipstick 04/20/2014 NEGATIVE  NEGATIVE Final  . Bilirubin Urine 04/20/2014 NEGATIVE  NEGATIVE Final  . Ketones, ur 04/20/2014 NEGATIVE  NEGATIVE mg/dL Final  . Protein, ur 04/20/2014 NEGATIVE  NEGATIVE mg/dL Final  . Urobilinogen, UA 04/20/2014 0.2  0.0 - 1.0 mg/dL Final  . Nitrite 04/20/2014 NEGATIVE  NEGATIVE Final  . Leukocytes, UA 04/20/2014 TRACE* NEGATIVE Final  . Squamous Epithelial / LPF 04/20/2014 MANY* RARE Final  . WBC, UA 04/20/2014 0-2  <3 WBC/hpf Final  . RBC / HPF 04/20/2014 0-2  <3 RBC/hpf Final  . Specimen Description 04/20/2014 URINE, CLEAN CATCH   Final  . Special Requests 04/20/2014 NONE   Final  . Culture  Setup Time 04/20/2014    Final                   Value:04/20/2014 13:56                         Performed at [facility]  . Colony Count 04/20/2014    Final                   Value:8,000 COLONIES/ML                         Performed at [facility]  . Culture 04/20/2014    Final                   Value:INSIGNIFICANT GROWTH                         Performed at [facility]  . Report Status 04/20/2014 04/21/2014 FINAL   Final     RADIOGRAPHIC STUDIES: No results found.  ASSESSMENT/PLAN:    Bilateral breast cancer Patient is status post neoadjuvant chemotherapy, bilateral  lumpectomy; and completed radiation therapy on 08/17/2014.  The plan is for the patient to initiate anastrozole therapy in mid February 2016.  Patient has plans to follow-up with Dr. Pablo Ledger radiation oncology on 09/18/2014.  Patient has plans to return to the [facility] for labs and a follow-up visit on 11/12/2014.  Patient knows to call in the interim she has any new worries or concerns.   Postherpetic neuralgia Patient was originally diagnosed with shingles to her left upper extremity several weeks ago; and has completed all of her acyclovir as directed.  She continues to complain of some fairly significant left upper extremity discomfort at her previous shingles rash site.  On exam-shingles rash does appear to be well-healed; but patient does continue with tenderness and deep pain radiating from her left wrist all the way up her arm; to the top of her shoulder and neck.  Patient also is noted to have some very mild bilateral upper extremity lymphedema; but no evidence of erythema, warmth, infection, or red streaks.  Patient was observed with full range of motion.  All pulses are palpable and all extremities are warm.  Patient denies any chest pain, chest pressure, shortness of breath, or pain with inspiration.  Advised patient that her significant left upper extremity discomfort is most likely post herpetic neuralgia; and may take weeks to months to completely resolve.  Refill patient's acyclovir today.  Confirm that she is taking the Neurontin as previously directed.  Patient states that she is going to discontinue her oxycodone; and initiate morphine immediate release 15 mg tablets  on a when necessary basis (morphine given to patient from previous visit).         Lymphedema Patient has trace lymphedema to her bilateral upper extremities.  Patient states that she already has an appointment to pick up bilateral lymphedema sleeves later today.   Patient stated understanding of all  instructions; and was in agreement with this plan of care. The patient knows to call the clinic with any problems, questions or concerns.   Review/collaboration with Dr. Lindi Adie and Dr. Pablo Ledger regarding all aspects of patient's visit today.   Total time spent with patient was 25 minutes;  with greater than 75 percent of that time spent in face to face counseling regarding patient's symptoms,  and coordination of care and follow up.  Disclaimer: This note was dictated with voice recognition software. Similar sounding words can inadvertently be transcribed and may not be corrected upon review.   Drue Second, NP 09/03/2014

## 2014-09-03 NOTE — Assessment & Plan Note (Signed)
Patient was originally diagnosed with shingles to her left upper extremity several weeks ago; and has completed all of her acyclovir as directed.  She continues to complain of some fairly significant left upper extremity discomfort at her previous shingles rash site.  On exam-shingles rash does appear to be well-healed; but patient does continue with tenderness and deep pain radiating from her left wrist all the way up her arm; to the top of her shoulder and neck.  Patient also is noted to have some very mild bilateral upper extremity lymphedema; but no evidence of erythema, warmth, infection, or red streaks.  Patient was observed with full range of motion.  All pulses are palpable and all extremities are warm.  Patient denies any chest pain, chest pressure, shortness of breath, or pain with inspiration.  Advised patient that her significant left upper extremity discomfort is most likely post herpetic neuralgia; and may take weeks to months to completely resolve.  Refill patient's acyclovir today.  Confirm that she is taking the Neurontin as previously directed.  Patient states that she is going to discontinue her oxycodone; and initiate morphine immediate release 15 mg tablets on a when necessary basis (morphine given to patient from previous visit).

## 2014-09-03 NOTE — Assessment & Plan Note (Signed)
Patient has trace lymphedema to her bilateral upper extremities.  Patient states that she already has an appointment to pick up bilateral lymphedema sleeves later today.

## 2014-09-03 NOTE — Assessment & Plan Note (Signed)
Patient is status post neoadjuvant chemotherapy, bilateral lumpectomy; and completed radiation therapy on 08/17/2014.  The plan is for the patient to initiate anastrozole therapy in mid February 2016.  Patient has plans to follow-up with Dr. Pablo Ledger radiation oncology on 09/18/2014.  Patient has plans to return to the [facility] for labs and a follow-up visit on 11/12/2014.  Patient knows to call in the interim she has any new worries or concerns.

## 2014-09-04 ENCOUNTER — Telehealth: Payer: Self-pay | Admitting: *Deleted

## 2014-09-04 ENCOUNTER — Other Ambulatory Visit: Payer: Medicare Other

## 2014-09-04 ENCOUNTER — Ambulatory Visit: Payer: Medicare Other | Admitting: Hematology and Oncology

## 2014-09-04 NOTE — Telephone Encounter (Signed)
Called pt & she reports that she took the morphine ordered for her & her arm is feeling much better today.  Discussed shingles pain & encouraged to take pain med prn & to cont neurontin & acyclovir.  She knows to call if symptoms worse.

## 2014-09-07 ENCOUNTER — Telehealth: Payer: Self-pay | Admitting: *Deleted

## 2014-09-07 ENCOUNTER — Other Ambulatory Visit: Payer: Self-pay | Admitting: *Deleted

## 2014-09-07 DIAGNOSIS — C50911 Malignant neoplasm of unspecified site of right female breast: Secondary | ICD-10-CM

## 2014-09-07 DIAGNOSIS — C50912 Malignant neoplasm of unspecified site of left female breast: Principal | ICD-10-CM

## 2014-09-07 NOTE — Telephone Encounter (Signed)
Patient called in reference to vaginal bleeding. Spoke with Dr. Lindi Adie and referral placed to gyn. pof sent. Attempted to call patient to notify but she did not answer and no voice mail set up.

## 2014-09-07 NOTE — Telephone Encounter (Signed)
Call received from patient reporting she "still is having numbness to hands and feet and a lot of pain to left arm.  second question is why she is having bloody discharge".  Discussed neuropathy s/p taxol ended 02-03-2014 due to the neuropathy.  Currently takes neurontin 600 mg tid.  Expressed that the neuropathy can be long-standing.  Third question is anastrozole start time and isn't this too much medication for me to take at one time.  Advised to begin anastrozole mid-February or 09-14-2014.     The blood is noticed "Everytime I urinate when I wipe, there is blood on the tissue".  Denies urine from bladder but vaginally and has gone through menopause.  Thought the doctor was going to make a Gyn referral.  "The shingles is drying up but not completely healed.  I have not had a pap smear in years and have not had a hysterectomy."  Will notify providers.  Next F/U planned for April 2016.

## 2014-09-08 ENCOUNTER — Telehealth: Payer: Self-pay | Admitting: Hematology and Oncology

## 2014-09-08 NOTE — Telephone Encounter (Signed)
, °

## 2014-09-08 NOTE — Telephone Encounter (Signed)
Noted GYN referral will be made and patient is to check her insurance to see what provider she can see.

## 2014-09-10 ENCOUNTER — Ambulatory Visit: Payer: Medicare Other | Admitting: Hematology and Oncology

## 2014-09-10 ENCOUNTER — Other Ambulatory Visit: Payer: Medicare Other

## 2014-09-11 ENCOUNTER — Other Ambulatory Visit: Payer: Self-pay | Admitting: Emergency Medicine

## 2014-09-11 DIAGNOSIS — C50911 Malignant neoplasm of unspecified site of right female breast: Secondary | ICD-10-CM

## 2014-09-11 MED ORDER — LORAZEPAM 1 MG PO TABS: 1.0000 mg | ORAL_TABLET | Freq: Four times a day (QID) | ORAL | Status: DC | PRN

## 2014-09-18 ENCOUNTER — Telehealth: Payer: Self-pay | Admitting: Nurse Practitioner

## 2014-09-18 ENCOUNTER — Ambulatory Visit: Payer: Medicare Other | Admitting: Radiation Oncology

## 2014-09-18 ENCOUNTER — Telehealth: Payer: Self-pay | Admitting: *Deleted

## 2014-09-18 ENCOUNTER — Other Ambulatory Visit: Payer: Self-pay | Admitting: Nurse Practitioner

## 2014-09-18 DIAGNOSIS — B0229 Other postherpetic nervous system involvement: Secondary | ICD-10-CM

## 2014-09-18 DIAGNOSIS — C50911 Malignant neoplasm of unspecified site of right female breast: Secondary | ICD-10-CM

## 2014-09-18 DIAGNOSIS — C50912 Malignant neoplasm of unspecified site of left female breast: Secondary | ICD-10-CM

## 2014-09-18 MED ORDER — OXYCODONE HCL 5 MG PO TABS: 5.0000 mg | ORAL_TABLET | ORAL | Status: DC | PRN

## 2014-09-18 NOTE — Telephone Encounter (Addendum)
Ms. Paula Navarro called to obtain a prescription for Oxycodone with report of pain in her left arm due to her shingles. Per the 09/03/14, note dictated by Retta Mac [name] stated she was not taking Oxycodone any longer and will start MSIR prn   Discovered that she is not taking her Acyclovir as prescribed. Educated her that the Acyclovir was instrumental in the resolution of her shingles and needed to be taken as prescribed.  She stated understanding.   Per the 09/03/14, note dictated by Retta Mac [name] stated she was not taking Oxycodone any longer and will start MSIR prn.  Retta Mac, Symptom Management, is presently calling Ms. Paula Navarro to assess her pain management needs and states she will fill whatever prescription she needs.

## 2014-09-18 NOTE — Telephone Encounter (Signed)
Patient called the cancer Center this afternoon requesting a refill of her oxycodone.  Patient states that she tried the morphine tablets; but prefers the oxycodone for pain relief.  Also, patient reports chronic/persistent vaginal bleeding/spotting for 1-2 years.  She states that the vaginal bleeding has slightly increased since she started her anti-estrogen therapy approximate 2 weeks ago.  Advised that patient should present to the emergency department this afternoon for further evaluation and management of chronic/increased vaginal bleeding; but patient refused.  Patient states that she has plans to follow appear the cancer Center next week with Dr. Lindi Adie.  Prescription refill for oxycodone 30 tablets will be picked up by patient's son Kennyth Lose.  Patient was advised to go directly to the emergency part for any worsening symptoms over the weekend whatsoever.

## 2014-09-24 ENCOUNTER — Telehealth: Payer: Self-pay

## 2014-09-24 ENCOUNTER — Telehealth: Payer: Self-pay | Admitting: Hematology and Oncology

## 2014-09-24 ENCOUNTER — Ambulatory Visit (HOSPITAL_BASED_OUTPATIENT_CLINIC_OR_DEPARTMENT_OTHER): Payer: Medicare Other | Admitting: Nurse Practitioner

## 2014-09-24 ENCOUNTER — Ambulatory Visit
Admission: RE | Admit: 2014-09-24 | Discharge: 2014-09-24 | Disposition: A | Discharge status: Home / Self Care | Payer: Medicare Other | Source: Ambulatory Visit | Attending: Radiation Oncology | Admitting: Radiation Oncology

## 2014-09-24 ENCOUNTER — Other Ambulatory Visit: Payer: Self-pay | Admitting: *Deleted

## 2014-09-24 ENCOUNTER — Ambulatory Visit: Payer: Medicare Other

## 2014-09-24 ENCOUNTER — Other Ambulatory Visit: Payer: Self-pay | Admitting: Nurse Practitioner

## 2014-09-24 ENCOUNTER — Encounter: Payer: Self-pay | Admitting: Nurse Practitioner

## 2014-09-24 VITALS — BP 131/90 | HR 79 | Temp 98.4°F | Resp 20

## 2014-09-24 VITALS — BP 112/73 | HR 85 | Temp 98.2°F | Wt 131.4 lb

## 2014-09-24 DIAGNOSIS — N939 Abnormal uterine and vaginal bleeding, unspecified: Secondary | ICD-10-CM | POA: Insufficient documentation

## 2014-09-24 DIAGNOSIS — R319 Hematuria, unspecified: Secondary | ICD-10-CM

## 2014-09-24 DIAGNOSIS — C50911 Malignant neoplasm of unspecified site of right female breast: Secondary | ICD-10-CM

## 2014-09-24 DIAGNOSIS — R21 Rash and other nonspecific skin eruption: Secondary | ICD-10-CM | POA: Insufficient documentation

## 2014-09-24 DIAGNOSIS — C50912 Malignant neoplasm of unspecified site of left female breast: Secondary | ICD-10-CM | POA: Insufficient documentation

## 2014-09-24 DIAGNOSIS — B0229 Other postherpetic nervous system involvement: Secondary | ICD-10-CM

## 2014-09-24 LAB — URINALYSIS, MICROSCOPIC - CHCC
Bilirubin (Urine): NEGATIVE
Glucose: NEGATIVE mg/dL
KETONES: NEGATIVE mg/dL
Nitrite: NEGATIVE
PH: 5 (ref 4.6–8.0)
PROTEIN: NEGATIVE mg/dL
SPECIFIC GRAVITY, URINE: 1.015 (ref 1.003–1.035)
Urobilinogen, UR: 0.2 mg/dL (ref 0.2–1)

## 2014-09-24 MED ORDER — OXYCODONE-ACETAMINOPHEN 5-325 MG PO TABS: 1.0000 | ORAL_TABLET | Freq: Four times a day (QID) | ORAL | Status: DC | PRN

## 2014-09-24 MED ORDER — CIPROFLOXACIN HCL 500 MG PO TABS
500.0000 mg | ORAL_TABLET | Freq: Two times a day (BID) | ORAL | Status: DC
Start: 2014-09-24 — End: 2014-11-12

## 2014-09-24 NOTE — Progress Notes (Signed)
Patient for routine one month follow up bilateral breast cancer.skin looks great.Shingles of left upper arm/shoulder has cleared up but she still has pain at times.Started oxy ir a few days ago but it caused a rash so she has discontinued it.Started arimidex 08/04/14.States she has noticed  mild vaginal bleeding over the last 2 weeks, "they know upstairs and are suppose to be getting an appointment with a gynecologist."Follow up with Dr.gudena in April and Dr.toth on 10/06/14.Pain level "2" .

## 2014-09-24 NOTE — Progress Notes (Signed)
SYMPTOM MANAGEMENT CLINIC   HPI: Paula Navarro 64 y.o. female diagnosed with bilateral breast cancer.  Patient is status post neoadjuvant chemotherapy, bilateral lumpectomies, and radiation therapy.  Patient initiated anastrozole therapy 1-2 weeks ago.  Patient had a follow up appointment with radiation oncologist Dr. Pablo Ledger earlier this morning.  Patient was recently diagnosed with shingles; and states that she has completed all of the Valacyclovir as directed.  She continues to complain of some significant left upper extremity pain at her previous shingles rash.  She continues to take Neurontin as previously directed.  She was only taking Percocet one half tablet 1-2 times per day; and felt that the Percocet was ineffective.  She had a plan to try her leftover morphine 15 mg immediate release tablets she already had home; but states that these tablets were completely ineffective and she flushed them down the toilet.  She then called the cancer Center requesting a refill of the oxycodone; which she was given.  She states that taking the oxycodone (instead of the Percocet) caused a mild rash/hives; which she treated with Benadryl.  She reports that she no longer has any rash or hives; or other allergic type symptoms at this time.  She is requesting a refill of her Percocet; stating that Percocet causes no allergic type symptoms.  She feels that she was not taking the Percocet often enough to manage her post herpetic pain in the past.  Also, patient once again reports chronic vaginal bleeding for quite some time.  Patient was very unclear if the vaginal bleeding had been occurring on a chronic basis for 6 months or 1-2 years.  However, Dr. Geralyn Flash office is aware of patient's complaint of chronic vaginal bleeding; and they have requested a gynecological referral for the patient.  Rash    Review of Systems  Skin: Positive for rash.    Past Medical History  Diagnosis Date  . Chronic  bronchitis   . Palpitation     Tachycardia reported by monitor clerk during a symptomatic spell  . Chest pain     Admitted to [facility] in 09/2011; refused stress test  . Atrial septal defect 1996    Surgical repair in 1996  . Tobacco abuse     60 pack years; 1.5 packs per day  . Anxiety   . Anemia   . Breast cancer   . Wears dentures     top  . COPD (chronic obstructive pulmonary disease)     on xray  . Chronic pain   . Radiation 06/30/14-08/17/14    Bilateral Breast    Past Surgical History  Procedure Laterality Date  . Cholecystectomy    . Cesarean section      x3  . Breast biopsy Bilateral   . Tubal ligation    . Asd repair, ostium primum  1996    dr Roxy Horseman  . Port a cath revision  1/15    put in   . Breast lumpectomy with radioactive seed localization Bilateral 04/08/2014    Procedure: BILATERAL  RADIOACTIVE SEED LOCALIZATION LUMPECTOMY ;  Surgeon: Autumn Messing III, MD;  Location: [place];  Service: General;  Laterality: Bilateral;  . Axillary lymph node dissection Bilateral 04/08/2014    Procedure:  BILATERAL AXILLARY LYMPH NODE DISSECTION;  Surgeon: Autumn Messing III, MD;  Location: [place];  Service: General;  Laterality: Bilateral;    has Chronic bronchitis; Tobacco abuse; Laboratory test; Palpitation; Chest pain; Atrial septal defect; Bilateral breast  cancer; Neuropathy due to chemotherapeutic drug; Hand foot syndrome; Anxiety; Suspected herpes zoster left C5 distribution; Postherpetic neuralgia; Lymphedema; Rash; and Vaginal bleeding on her problem list.    is allergic to codeine and morphine and related.    Medication List       This list is accurate as of: 09/24/14 11:30 AM.  Always use your most recent med list.               acyclovir 200 MG capsule  Commonly known as:  ZOVIRAX  Take 1 capsule (200 mg total) by mouth 5 (five) times daily. Take 1 tablet 5 times a day for 10 days.     anastrozole 1 MG tablet  Commonly known as:   ARIMIDEX  Take 1 tablet (1 mg total) by mouth daily.     clobetasol cream 0.05 %  Commonly known as:  TEMOVATE  Apply 1 application topically 2 (two) times daily. Apply to hands and feet twice a day as needed.     gabapentin 300 MG capsule  Commonly known as:  NEURONTIN  TAKE 2 CAPSULES (600 MG TOTAL) BY MOUTH 3 (THREE) TIMES DAILY.     ibuprofen 200 MG tablet  Commonly known as:  ADVIL,MOTRIN  Take 200 mg by mouth every 6 (six) hours as needed.     lidocaine-prilocaine cream  Commonly known as:  EMLA  Apply 1 application topically as needed.     loratadine 10 MG tablet  Commonly known as:  CLARITIN  Take 10 mg by mouth daily as needed for itching.     LORazepam 1 MG tablet  Commonly known as:  ATIVAN  Take 1 tablet (1 mg total) by mouth every 6 (six) hours as needed.     oxyCODONE-acetaminophen 5-325 MG per tablet  Commonly known as:  PERCOCET/ROXICET  Take 1-2 tablets by mouth every 6 (six) hours as needed for severe pain.     polyethylene glycol packet  Commonly known as:  MIRALAX / GLYCOLAX  Take 17 g by mouth daily as needed for moderate constipation.     SUPER B COMPLEX PO  Take 1 tablet by mouth daily.         PHYSICAL EXAMINATION  Blood pressure 131/90, pulse 79, temperature 98.4 F (36.9 C), temperature source Oral, resp. rate 20, last menstrual period 05/20/2011.  Physical Exam  Constitutional: She is oriented to person, place, and time and well-developed, well-nourished, and in no distress.  HENT:  Head: Normocephalic and atraumatic.  Mouth/Throat: Oropharynx is clear and moist.  Eyes: Conjunctivae and EOM are normal. Pupils are equal, round, and reactive to light. Right eye exhibits no discharge. Left eye exhibits no discharge. No scleral icterus.  Neck: Normal range of motion. Neck supple. No JVD present. No tracheal deviation present. No thyromegaly present.  Cardiovascular: Normal rate, regular rhythm, normal heart sounds and intact distal pulses.     Pulmonary/Chest: Effort normal and breath sounds normal. No respiratory distress. She has no wheezes. She has no rales. She exhibits no tenderness.  Abdominal: Soft. Bowel sounds are normal. She exhibits no distension and no mass. There is no tenderness. There is no rebound and no guarding.  Musculoskeletal: Normal range of motion. She exhibits edema and tenderness.  Bilateral upper extremity trace lymphedema.  Lymphadenopathy:    She has no cervical adenopathy.  Neurological: She is alert and oriented to person, place, and time. Gait normal.  Skin: Skin is warm and dry.  Left upper extremity shingles rash does appear  to have cleared; but  still with trace remnants of healing rash.  Patient does continue with significant pain to previous rash site.  Psychiatric: Affect normal.  Nursing note and vitals reviewed.   LABORATORY DATA:. Hospital Outpatient Visit on 09/24/2014  Component Date Value Ref Range Status  . Glucose 09/24/2014 Negative  Negative mg/dL Final  . Bilirubin (Urine) 09/24/2014 Negative  Negative Final  . Ketones 09/24/2014 Negative  Negative mg/dL Final  . Specific Gravity, Urine 09/24/2014 1.015  1.003 - 1.035 Final  . Blood 09/24/2014 Moderate  Negative Final  . pH 09/24/2014 5.0  4.6 - 8.0 Final  . Protein 09/24/2014 Negative  Negative- <30 mg/dL Final  . Urobilinogen, UR 09/24/2014 0.2  0.2 - 1 mg/dL Final  . Nitrite 09/24/2014 Negative  Negative Final  . Leukocyte Esterase 09/24/2014 Small  Negative Final  . RBC / HPF 09/24/2014 7-10  0 - 2 Final  . WBC, UA 09/24/2014 11-20  0 - 2 Final  . Bacteria, UA 09/24/2014 Few  Negative- Trace Final  . Epithelial Cells 09/24/2014 Few  Negative- Few Final     RADIOGRAPHIC STUDIES: No results found.  ASSESSMENT/PLAN:    Bilateral breast cancer Patient is status post neoadjuvant chemotherapy, bilateral lumpectomy; and completed radiation therapy on 08/17/2014.  The patient initiated anastrozole therapy on a daily basis  approximately 1-2 weeks ago.  She appears to be tolerating the anastrozole fairly well so far.  Patient met with radiation oncologist Dr. Pablo Ledger earlier today for follow up.    Patient has plans to return to the [facility] for labs and a follow-up visit on 11/12/2014.  Patient knows to call in the interim she has any new worries or concerns.  Also, patient questioned if she was able to obtain a flu vaccine.  Check with Dr. Lindi Adie; and patient has been given permission to have a flu vaccination.  Offered a flu vaccination for the patient while at the cancer center; but patient refused.  She states that she always goes to her local pharmacy with her mother so they can both received her flu vaccinations at the same time.    Postherpetic neuralgia Patient was originally diagnosed with shingles to her left upper extremity several weeks ago; and has completed all of her acyclovir as directed.  She continues to complain of some fairly significant left upper extremity discomfort at her previous shingles rash site.  On exam-shingles rash does appear to be well-healed; but patient does continue with tenderness and deep pain radiating from her left wrist all the way up her arm; to the top of her shoulder and neck.  Patient also is noted to have some very mild bilateral upper extremity lymphedema; but no evidence of erythema, warmth, infection, or red streaks.  Patient was observed with full range of motion.  All pulses are palpable and all extremities are warm.  Patient denies any chest pain, chest pressure, shortness of breath, or pain with inspiration.  Advised patient that her significant left upper extremity discomfort is most likely post herpetic neuralgia; and may take weeks to months to completely resolve. Confirm that she is taking the Neurontin as previously directed.  At her last visit here at the cancer Center-patient stated that her Percocet was ineffective.  She stated that she was only  taken one half to one tablet 2 times per day.  She had a planned at that time to try some of her leftover morphine 15 mg immediate release tablets.  She now states  that the morphine immediate release tablets were completely ineffective; and she flushed them down the toilet.  She was given a prescription for oxycodone; but she states that the oxycodone caused mild hives.  She managed the hives with Benadryl; no longer has any allergic reaction whatsoever.  She is requesting a refill of her Percocet tablets; stating that Percocet causes her no issues whatsoever.  Refill for Percocet was given today.          Rash At her last visit here at the cancer Center-patient stated that her Percocet was ineffective.  She stated that she was only taken one half to one tablet 2 times per day.  She had a planned at that time to try some of her leftover morphine 15 mg immediate release tablets.  She now states that the morphine immediate release tablets were completely ineffective; and she flushed them down the toilet.  She was given a prescription for oxycodone; but she states that the oxycodone caused mild hives.  She managed the hives with Benadryl; and no longer has any allergic reaction whatsoever.   On exam-patient with healed scars from previous left upper extremity shingles rash.  No evidence of rash or hives noted otherwise at this time.    She is requesting a refill of her Percocet tablets; stating that Percocet causes her no issues whatsoever.  Refill for Percocet was given today.   Vaginal bleeding Patient states that she has been experiencing some intermittent, mild vaginal bleeding for approximately 6 months.  She denies any vaginal discomfort or other issues.  GYN referral has already been placed; and confirm today that Dr. Geralyn Flash office will be assisting in GYN appointment arrangement as soon as possible.    Patient stated understanding of all instructions; and was in agreement with this plan  of care. The patient knows to call the clinic with any problems, questions or concerns.   Review/collaboration with Dr. Lindi Adie  regarding all aspects of patient's visit today.   Total time spent with patient was 25 minutes;  with greater than 75 percent of that time spent in face to face counseling regarding patient's symptoms,  and coordination of care and follow up.  Disclaimer: This note was dictated with voice recognition software. Similar sounding words can inadvertently be transcribed and may not be corrected upon review.   Drue Second, NP 09/24/2014

## 2014-09-24 NOTE — Telephone Encounter (Signed)
E-prescribe script failed.prescription for cipro 500 mg 1 po bid called into pharmacy.

## 2014-09-24 NOTE — Telephone Encounter (Signed)
added appt per pof...pt here

## 2014-09-24 NOTE — Progress Notes (Signed)
   Department of Radiation Oncology  Phone:  [phone] Fax:        [phone]   Name: Paula Navarro MRN: [id number]  DOB: 1951/06/29  Date: 09/24/2014  Follow Up Visit Note  Diagnosis: Bilateral breast cancer   Staging form: Breast, AJCC 7th Edition     Clinical: Stage IIB (T2, N1, cM0) - Unsigned       Staging comments: Staged at breast conference 08/13/13   Summary and Interval since last radiation: Radiation to the bilateral breast and supraclavicular fossa to 61 Gy completed 08/17/14  Interval History: Paula Navarro presents today for routine followup.  She is feeling better from her shingles. She needs a new medication other than oxycodone as it gave her hives. She is taking the arimidex and tolerating that well. She complains of hematuria and has an appointment being set up by medical oncology per her report. She is pleased with her cosmetic result.   Physical Exam:  Filed Vitals:   09/24/14 0913  BP: 112/73  Pulse: 85  Temp: 98.2 F (36.8 C)  Weight: 131 lb 6.4 oz (59.603 kg)  SpO2: 97%   Hyperpigmentation continues over the bilateral breast including inframammary folds.   IMPRESSION: Paula Navarro is a 64 y.o. female s/p radiation to the bilateral breasts with resolving acute effects of treatment.   PLAN:  She is doing well. We discussed the need for follow up every 4-6 months which she has scheduled.  We discussed the need for yearly mammograms which she can schedule with her OBGYN or with medical oncology. We discussed the need for sun protection in the treated area.  She can always call me with questions.  I will follow up with her on an as needed basis.   She will see Cyndee for her pain medications and I have ordered a UA.     Thea Silversmith, MD

## 2014-09-24 NOTE — Assessment & Plan Note (Signed)
At her last visit here at the cancer Center-patient stated that her Percocet was ineffective.  She stated that she was only taken one half to one tablet 2 times per day.  She had a planned at that time to try some of her leftover morphine 15 mg immediate release tablets.  She now states that the morphine immediate release tablets were completely ineffective; and she flushed them down the toilet.  She was given a prescription for oxycodone; but she states that the oxycodone caused mild hives.  She managed the hives with Benadryl; and no longer has any allergic reaction whatsoever.   On exam-patient with healed scars from previous left upper extremity shingles rash.  No evidence of rash or hives noted otherwise at this time.    She is requesting a refill of her Percocet tablets; stating that Percocet causes her no issues whatsoever.  Refill for Percocet was given today.

## 2014-09-24 NOTE — Telephone Encounter (Signed)
Called patient to inform script for cipro sent to pharmacy to take for urinary tract infection found on urinalysis performed today.no answer on phone and no voicemail.I will call again in the morning.

## 2014-09-24 NOTE — Assessment & Plan Note (Signed)
Patient was originally diagnosed with shingles to her left upper extremity several weeks ago; and has completed all of her acyclovir as directed.  She continues to complain of some fairly significant left upper extremity discomfort at her previous shingles rash site.  On exam-shingles rash does appear to be well-healed; but patient does continue with tenderness and deep pain radiating from her left wrist all the way up her arm; to the top of her shoulder and neck.  Patient also is noted to have some very mild bilateral upper extremity lymphedema; but no evidence of erythema, warmth, infection, or red streaks.  Patient was observed with full range of motion.  All pulses are palpable and all extremities are warm.  Patient denies any chest pain, chest pressure, shortness of breath, or pain with inspiration.  Advised patient that her significant left upper extremity discomfort is most likely post herpetic neuralgia; and may take weeks to months to completely resolve. Confirm that she is taking the Neurontin as previously directed.  At her last visit here at the cancer Center-patient stated that her Percocet was ineffective.  She stated that she was only taken one half to one tablet 2 times per day.  She had a planned at that time to try some of her leftover morphine 15 mg immediate release tablets.  She now states that the morphine immediate release tablets were completely ineffective; and she flushed them down the toilet.  She was given a prescription for oxycodone; but she states that the oxycodone caused mild hives.  She managed the hives with Benadryl; no longer has any allergic reaction whatsoever.  She is requesting a refill of her Percocet tablets; stating that Percocet causes her no issues whatsoever.  Refill for Percocet was given today.

## 2014-09-24 NOTE — Assessment & Plan Note (Signed)
Patient states that she has been experiencing some intermittent, mild vaginal bleeding for approximately 6 months.  She denies any vaginal discomfort or other issues.  GYN referral has already been placed; and confirm today that Dr. Geralyn Flash office will be assisting in GYN appointment arrangement as soon as possible.

## 2014-09-24 NOTE — Assessment & Plan Note (Addendum)
Patient is status post neoadjuvant chemotherapy, bilateral lumpectomy; and completed radiation therapy on 08/17/2014.  The patient initiated anastrozole therapy on a daily basis approximately 1-2 weeks ago.  She appears to be tolerating the anastrozole fairly well so far.  Patient met with radiation oncologist Dr. Pablo Ledger earlier today for follow up.    Patient has plans to return to the [facility] for labs and a follow-up visit on 11/12/2014.  Patient knows to call in the interim she has any new worries or concerns.  Also, patient questioned if she was able to obtain a flu vaccine.  Check with Dr. Lindi Adie; and patient has been given permission to have a flu vaccination.  Offered a flu vaccination for the patient while at the cancer center; but patient refused.  She states that she always goes to her local pharmacy with her mother so they can both received her flu vaccinations at the same time.

## 2014-09-25 ENCOUNTER — Telehealth: Payer: Self-pay

## 2014-09-25 NOTE — Telephone Encounter (Signed)
Informed patient to pick up cipro from [facility] today.Take one pill twice daily and drink with full glass of water.

## 2014-09-29 ENCOUNTER — Telehealth: Payer: Self-pay | Admitting: *Deleted

## 2014-09-29 NOTE — Telephone Encounter (Signed)
Received voice message from patient stating,"I don't want to talk to a nurse, I want to speak to [facility]. My return number is [phone]." This RN tried calling patient but the phone number given is not set up to receive voice mails. Patient didn't give a reason for the call. This message will be given to Drue Second, NP.

## 2014-09-29 NOTE — Telephone Encounter (Signed)
Procedures call this morning from patient with complaint of intermittent rash along her panty line.  She states she has a mild rash directly below the elastic of her underwear.  She denies any specific itching or other allergic-type symptoms whatsoever.  Advised patient that she may very well have developed a latex allergy to the elastic in her underwear.  Advised she try either removing the underwear completely; or rolling the underwear down so the elastic does not touch her skin.  Also advised patient she could try Benadryl 25 mg every 6 hours; and Pepcid 20 mg every 12 hours.  Advised patient to call/return or go directly to the emergency department if she develops any worsening symptoms whatsoever.  Patient stated understanding of all instructions and was in agreement with this plan of care.

## 2014-10-03 ENCOUNTER — Other Ambulatory Visit: Payer: Self-pay | Admitting: Nurse Practitioner

## 2014-10-04 ENCOUNTER — Other Ambulatory Visit: Payer: Self-pay | Admitting: Hematology and Oncology

## 2014-10-06 ENCOUNTER — Encounter: Payer: Medicare Other | Admitting: Nurse Practitioner

## 2014-10-06 ENCOUNTER — Telehealth: Payer: Self-pay

## 2014-10-06 ENCOUNTER — Other Ambulatory Visit: Payer: Self-pay

## 2014-10-06 ENCOUNTER — Telehealth: Payer: Self-pay | Admitting: *Deleted

## 2014-10-06 NOTE — Telephone Encounter (Signed)
Patient called stating that she has developed hives over body. Patient complains of pain, itching, and redness with these hives. Selena Lesser NP notified and patient will be seen today in symptom management. RN notified patient. POF sent to scheduler.

## 2014-10-06 NOTE — Telephone Encounter (Signed)
Returned patient's call regarding breaking out ? Hives.Patient has appointment to see symptom management nurse today at 1:00pm."I can't make it today>" prefers morning appointment for tomorrow.Left voice message for Paula Bal RN for Paula Navarro today to call patient.

## 2014-10-07 ENCOUNTER — Other Ambulatory Visit: Payer: Self-pay | Admitting: Hematology and Oncology

## 2014-10-07 ENCOUNTER — Other Ambulatory Visit: Payer: Self-pay

## 2014-10-07 ENCOUNTER — Telehealth: Payer: Self-pay

## 2014-10-07 NOTE — Telephone Encounter (Signed)
Attempted to call pt re: request to see Retta Mac.  Pt did not answer.  No voice mail set up.

## 2014-10-08 NOTE — Telephone Encounter (Signed)
Next OV 11/12/14.  Chart reviewed

## 2014-10-09 ENCOUNTER — Telehealth: Payer: Self-pay | Admitting: Hematology and Oncology

## 2014-10-09 ENCOUNTER — Other Ambulatory Visit: Payer: Self-pay

## 2014-10-09 DIAGNOSIS — C50911 Malignant neoplasm of unspecified site of right female breast: Secondary | ICD-10-CM

## 2014-10-09 DIAGNOSIS — C50912 Malignant neoplasm of unspecified site of left female breast: Principal | ICD-10-CM

## 2014-10-09 NOTE — Telephone Encounter (Signed)
Ref  Noted for pain center and aref was adjusted for pain and rehab center   [facility]

## 2014-11-03 NOTE — Telephone Encounter (Signed)
error 

## 2014-11-06 ENCOUNTER — Telehealth: Payer: Self-pay

## 2014-11-06 ENCOUNTER — Other Ambulatory Visit: Payer: Self-pay | Admitting: Hematology and Oncology

## 2014-11-06 DIAGNOSIS — C50912 Malignant neoplasm of unspecified site of left female breast: Principal | ICD-10-CM

## 2014-11-06 DIAGNOSIS — C50911 Malignant neoplasm of unspecified site of right female breast: Secondary | ICD-10-CM

## 2014-11-06 NOTE — Telephone Encounter (Signed)
Referral sent to [facility].  Sent to scan.

## 2014-11-12 ENCOUNTER — Encounter: Payer: Self-pay | Admitting: Medical Oncology

## 2014-11-12 ENCOUNTER — Telehealth: Payer: Self-pay | Admitting: Hematology and Oncology

## 2014-11-12 ENCOUNTER — Other Ambulatory Visit (HOSPITAL_BASED_OUTPATIENT_CLINIC_OR_DEPARTMENT_OTHER): Payer: Medicare Other

## 2014-11-12 ENCOUNTER — Ambulatory Visit (HOSPITAL_BASED_OUTPATIENT_CLINIC_OR_DEPARTMENT_OTHER): Payer: Medicare Other

## 2014-11-12 ENCOUNTER — Ambulatory Visit (HOSPITAL_BASED_OUTPATIENT_CLINIC_OR_DEPARTMENT_OTHER): Payer: Medicare Other | Admitting: Hematology and Oncology

## 2014-11-12 VITALS — BP 141/79 | HR 86 | Temp 98.6°F | Resp 18 | Ht 62.0 in | Wt 134.8 lb

## 2014-11-12 DIAGNOSIS — C50912 Malignant neoplasm of unspecified site of left female breast: Secondary | ICD-10-CM

## 2014-11-12 DIAGNOSIS — Z95828 Presence of other vascular implants and grafts: Secondary | ICD-10-CM

## 2014-11-12 DIAGNOSIS — C50911 Malignant neoplasm of unspecified site of right female breast: Secondary | ICD-10-CM

## 2014-11-12 LAB — COMPREHENSIVE METABOLIC PANEL (CC13)
ALT: 25 U/L (ref 0–55)
AST: 29 U/L (ref 5–34)
Albumin: 3.8 g/dL (ref 3.5–5.0)
Alkaline Phosphatase: 72 U/L (ref 40–150)
Anion Gap: 10 mEq/L (ref 3–11)
BUN: 13.1 mg/dL (ref 7.0–26.0)
CALCIUM: 9.5 mg/dL (ref 8.4–10.4)
CO2: 24 mEq/L (ref 22–29)
Chloride: 107 mEq/L (ref 98–109)
Creatinine: 0.8 mg/dL (ref 0.6–1.1)
GLUCOSE: 103 mg/dL (ref 70–140)
POTASSIUM: 4.5 meq/L (ref 3.5–5.1)
Sodium: 141 mEq/L (ref 136–145)
Total Bilirubin: 0.49 mg/dL (ref 0.20–1.20)
Total Protein: 7.3 g/dL (ref 6.4–8.3)

## 2014-11-12 LAB — CBC WITH DIFFERENTIAL/PLATELET
BASO%: 0.5 % (ref 0.0–2.0)
Basophils Absolute: 0 10*3/uL (ref 0.0–0.1)
EOS%: 4.1 % (ref 0.0–7.0)
Eosinophils Absolute: 0.3 10*3/uL (ref 0.0–0.5)
HCT: 38.2 % (ref 34.8–46.6)
HGB: 12.3 g/dL (ref 11.6–15.9)
LYMPH%: 23 % (ref 14.0–49.7)
MCH: 23.4 pg — ABNORMAL LOW (ref 25.1–34.0)
MCHC: 32.2 g/dL (ref 31.5–36.0)
MCV: 72.6 fL — ABNORMAL LOW (ref 79.5–101.0)
MONO#: 0.5 10*3/uL (ref 0.1–0.9)
MONO%: 7.1 % (ref 0.0–14.0)
NEUT#: 4.8 10*3/uL (ref 1.5–6.5)
NEUT%: 65.3 % (ref 38.4–76.8)
NRBC: 0 % (ref 0–0)
Platelets: 280 10*3/uL (ref 145–400)
RBC: 5.26 10*6/uL (ref 3.70–5.45)
RDW: 14.4 % (ref 11.2–14.5)
WBC: 7.3 10*3/uL (ref 3.9–10.3)
lymph#: 1.7 10*3/uL (ref 0.9–3.3)

## 2014-11-12 MED ORDER — LORAZEPAM 1 MG PO TABS: 1.0000 mg | ORAL_TABLET | Freq: Every day | ORAL | Status: DC

## 2014-11-12 MED ORDER — SODIUM CHLORIDE 0.9 % IJ SOLN
10.0000 mL | INTRAMUSCULAR | Status: DC | PRN
  Administered 2014-11-12: 10 mL via INTRAVENOUS
  Filled 2014-11-12: qty 10

## 2014-11-12 MED ORDER — OXYCODONE-ACETAMINOPHEN 5-325 MG PO TABS: 1.0000 | ORAL_TABLET | Freq: Four times a day (QID) | ORAL | Status: DC | PRN

## 2014-11-12 MED ORDER — HEPARIN SOD (PORK) LOCK FLUSH 100 UNIT/ML IV SOLN
500.0000 [IU] | Freq: Once | INTRAVENOUS | Status: AC
  Administered 2014-11-12: 500 [IU] via INTRAVENOUS
  Filled 2014-11-12: qty 5

## 2014-11-12 NOTE — Patient Instructions (Signed)

## 2014-11-12 NOTE — Assessment & Plan Note (Signed)
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 Takes Percocet half a tablet when necessary. Anastrozole toxicities:  I offered her participation in clinical trial PALLAS   PALLAS: Patients who have completed definitive therapy for breast cancer are randomized to antiestrogen therapy (5+ years) versus antiestrogen therapy plus Palbociclib (2 years). Palbociclib: If she was randomized to Palbociclib, I discussed the risks and benefits of Ibrance including myelosuppression especially neutropenia and risk of infection, there is risk of pulmonary embolism and mild peripheral neuropathy as well. Fatigue, nausea, diarrhea, decreased appetite as well as alopecia and thrombocytopenia are also potential side effects of Palbociclib.

## 2014-11-12 NOTE — Telephone Encounter (Signed)
per pof ot sch pt papt-gave pt copy of sch

## 2014-11-12 NOTE — Progress Notes (Signed)
Patient Care Team: [name], MD as PCP - General (Family Medicine) [name], MD (Cardiology)  DIAGNOSIS: Bilateral breast cancer   Staging form: Breast, AJCC 7th Edition     Clinical: Stage IIB (T2, N1, cM0) - Unsigned       Staging comments: Staged at breast conference 08/13/13      Pathologic: No stage assigned - Unsigned   SUMMARY OF ONCOLOGIC HISTORY:   Bilateral breast cancer   07/23/2013 Mammogram Bilateral breast masses. 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 N1 M0 stage IIB ER 100%, PR 0%, HER-2    06/17/2014 -  Radiation Therapy Adjuvant radiation therapy   09/14/2014 -  Anti-estrogen oral therapy Anastrozole 1 mg daily    CHIEF COMPLIANT: Follow-up on anastrozole  INTERVAL HISTORY: Paula Navarro is a 64 year old with above-mentioned history of left's sided lumpectomy and adjuvant radiation and is currently started on anastrozole. She is tolerating anastrozole extremely well without any major problems or concerns. She denies hot flashes.  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 She also has pain in her hands and feet. For which she takes Percocet. She is requesting a refill of Percocet and Ativan today.  REVIEW OF SYSTEMS:   Constitutional: Denies fevers, chills or abnormal weight loss Eyes: Denies blurriness of vision Ears, nose, mouth, throat, and face: Denies mucositis or sore throat Respiratory: Denies cough, dyspnea or wheezes Cardiovascular: Denies palpitation, chest discomfort or lower extremity swelling Gastrointestinal:  Denies nausea, heartburn or change in bowel habits Skin: Denies abnormal skin rashes Lymphatics: Denies new lymphadenopathy or easy bruising Neurological:Denies numbness, tingling or new weaknesses Behavioral/Psych: Mood is stable, no new changes  Breast:  denies any pain or lumps or nodules in either breasts All other systems were reviewed with the patient and are negative.  I have reviewed the past medical history, past surgical history, social history and family history with the patient and they are unchanged from previous note.  ALLERGIES:  is allergic to codeine and morphine and related.  MEDICATIONS:  Current Outpatient Prescriptions  Medication Sig Dispense Refill  . anastrozole (ARIMIDEX) 1 MG tablet Take 1 tablet (1 mg total) by mouth daily. 90 tablet 6  . B Complex-C (SUPER B COMPLEX PO) Take 1 tablet by mouth daily.    . clobetasol cream (TEMOVATE) 1.22 % Apply 1 application topically 2 (two) times daily. Apply to hands and feet twice a day as needed. 30 g 1  . gabapentin (NEURONTIN) 300  MG capsule TAKE 2 CAPSULES (600 MG TOTAL) BY MOUTH 3 (THREE) TIMES DAILY. 180 capsule 1  . ibuprofen (ADVIL,MOTRIN) 200 MG tablet Take 200 mg by mouth every 6 (six) hours as needed.    . lidocaine-prilocaine (EMLA) cream Apply 1 application topically as needed. 30 g 1  . loratadine (CLARITIN) 10 MG tablet Take 10 mg by mouth daily as  needed for itching.    Marland Kitchen LORazepam (ATIVAN) 1 MG tablet Take 1 tablet (1 mg total) by mouth at bedtime. 30 tablet 0  . oxyCODONE-acetaminophen (PERCOCET/ROXICET) 5-325 MG per tablet Take 1 tablet by mouth every 6 (six) hours as needed for severe pain. 50 tablet 0  . polyethylene glycol (MIRALAX / GLYCOLAX) packet Take 17 g by mouth daily as needed for moderate constipation.     No current facility-administered medications for this visit.   Facility-Administered Medications Ordered in Other Visits  Medication Dose Route Frequency Provider Last Rate Last Dose  . sodium chloride 0.9 % injection 10 mL  10 mL Intravenous PRN Nicholas Lose, MD   10 mL at 11/12/14 [id number]    PHYSICAL EXAMINATION: ECOG PERFORMANCE STATUS: 1 - Symptomatic but completely ambulatory  Filed Vitals:   11/12/14 0931  BP: 141/79  Pulse: 86  Temp: 98.6 F (37 C)  Resp: 18   Filed Weights   11/12/14 0931  Weight: 134 lb 12.8 oz (61.145 kg)    GENERAL:alert, no distress and comfortable SKIN: skin color, texture, turgor are normal, no rashes or significant lesions EYES: normal, Conjunctiva are pink and non-injected, sclera clear OROPHARYNX:no exudate, no erythema and lips, buccal mucosa, and tongue normal  NECK: supple, thyroid normal size, non-tender, without nodularity LYMPH:  no palpable lymphadenopathy in the cervical, axillary or inguinal LUNGS: clear to auscultation and percussion with normal breathing effort HEART: regular rate & rhythm and no murmurs and no lower extremity edema ABDOMEN:abdomen soft, non-tender and normal bowel sounds Musculoskeletal:no cyanosis of digits and no clubbing  NEURO: alert & oriented x 3 with fluent speech, no focal motor/sensory deficits BREAST: No palpable masses or nodules in either right or left breasts. No palpable axillary supraclavicular or infraclavicular adenopathy no breast tenderness or nipple discharge. (exam performed in the presence of a chaperone)  LABORATORY  DATA:  I have reviewed the data as listed   Chemistry      Component Value Date/Time   NA 140 03/27/2014 1348   NA 141 09/23/2012 0414   K 4.2 03/27/2014 1348   K 3.8 09/23/2012 0414   CL 107 09/23/2012 0414   CO2 25 03/27/2014 1348   CO2 29 10/30/2011 1419   BUN 9.2 03/27/2014 1348   BUN 7 09/23/2012 0414   CREATININE 0.7 03/27/2014 1348   CREATININE 0.90 09/23/2012 0414      Component Value Date/Time   CALCIUM 9.8 03/27/2014 1348   CALCIUM 10.1 10/30/2011 1419   ALKPHOS 55 03/27/2014 1348   AST 32 03/27/2014 1348   ALT 31 03/27/2014 1348   BILITOT 0.85 03/27/2014 1348       Lab Results  Component Value Date   WBC 7.3 11/12/2014   HGB 12.3 11/12/2014   HCT 38.2 11/12/2014   MCV 72.6* 11/12/2014   PLT 280 11/12/2014   NEUTROABS 4.8 11/12/2014     ASSESSMENT & PLAN:  Bilateral breast cancer Bilateral breast cancers: Treated with neoadjuvant chemotherapy followed by surgery, currently receiving radiation treatment, antiestrogen therapy to follow  Left breast T2, N0, M0, IDC grade 3; 2.1 cm with  high-grade DCIS 0/16 LN, ER 6%, PR 0%, HER-2 negative 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 Takes Percocet half a tablet when necessary. Anastrozole toxicities: 1. Denies any hot flashes or myalgias. 2. Tolerating it very well   I offered her participation in clinical trial PALLAS   PALLAS: Patients who have completed definitive therapy for breast cancer are randomized to antiestrogen therapy (5+ years) versus antiestrogen therapy plus Palbociclib (2 years). Palbociclib: If she was randomized to Palbociclib, I discussed the risks and benefits of Ibrance including myelosuppression especially neutropenia and risk of infection, there is risk of pulmonary embolism and mild peripheral neuropathy as well. Fatigue,  nausea, diarrhea, decreased appetite as well as alopecia and thrombocytopenia are also potential side effects of Palbociclib.  Breast cancer surveillance: 1. She will need a mammogram to be scheduled within the next month. And then on she will have it annually. 2. Breast exam 11/12/2014 is normal  The patient would like to participate in the clinical trial, she will be seen sooner otherwise we will see her in 6 months for follow-up. I renewed her prescription forPercocet  and Ativan.        Orders Placed This Encounter  Procedures  . CBC with Differential    Standing Status: Future     Number of Occurrences:      Standing Expiration Date: 11/12/2015  . Comprehensive metabolic panel (Cmet) - [facility]    Standing Status: Future     Number of Occurrences:      Standing Expiration Date: 11/12/2015   The patient has a good understanding of the overall plan. she agrees with it. She will call with any problems that may develop before her next visit here.   Rulon Eisenmenger, MD

## 2014-11-16 ENCOUNTER — Encounter: Payer: Self-pay | Admitting: *Deleted

## 2014-11-25 ENCOUNTER — Telehealth: Payer: Self-pay | Admitting: *Deleted

## 2014-11-25 ENCOUNTER — Other Ambulatory Visit: Payer: Self-pay | Admitting: Nurse Practitioner

## 2014-11-25 ENCOUNTER — Telehealth: Payer: Self-pay | Admitting: Nurse Practitioner

## 2014-11-25 ENCOUNTER — Other Ambulatory Visit: Payer: Self-pay | Admitting: *Deleted

## 2014-11-25 DIAGNOSIS — C50919 Malignant neoplasm of unspecified site of unspecified female breast: Secondary | ICD-10-CM

## 2014-11-25 DIAGNOSIS — C50911 Malignant neoplasm of unspecified site of right female breast: Secondary | ICD-10-CM

## 2014-11-25 DIAGNOSIS — C50912 Malignant neoplasm of unspecified site of left female breast: Principal | ICD-10-CM

## 2014-11-25 NOTE — Telephone Encounter (Signed)
Patient called the cancer Center today requesting urgent care visit tomorrow.  She is complaining of chronic pain to her previous mastectomy sites for approximately 9 months.  She states that she has run out of the Percocet 5/325 tablets that she was prescribed on 11/12/2014.  She is also complaining of some questionable lymphedema to her bilateral arms.  She states that Dr. Marlou Starks gave her prescription for compression sleeve; but had difficulty obtaining the compression sleeve at a retail pharmacy and subsequently left her prescription at the pharmacy.  Patient is also complaining of chronic vaginal bleeding for the past several months.  She continues to take anastrozole as previously directed on a daily basis.  Patient denies any other new symptoms whatsoever.  She denies any recent fevers or chills.  Patient has been referred to the pain management clinic on 2 different occasions; has been referred to both the lymphedema clinic and a gynecologist as well her for her previously noted symptoms.  Patient states she has never received any phone calls regarding these referrals or appointments.  Advised patient would reorder a pain management clinic referral.  Patient has appointment with Dr. Marlou Starks on 12/01/2014.  Patient states that she will follow up with Dr. Marlou Starks regarding any further pain medication until she has pain clinic referral appointment.  Also, advised patient would place orders once again for both a lymphedema clinic referral and a gynecological referral.  Advised patient that the lymphedema clinic referral would assist her with lymphedema massage technique instruction as well as a compression sleeve.  Advised patient that she definitely needs a GYN evaluation of her chronic vaginal bleeding as soon as possible.  Suggested to patient that the [place] social worker may need to become involved in her care; to help assist with any appointment management and transportation issues.  We'll  place all 3 of these referrals once again today; and will plan to review all with the [place] social worker tomorrow.  Patient was in full agreement to cancel her 9:00 appointment for tomorrow morning 11/26/2014.  She states she has a friend's funeral to attend tomorrow at 11 AM.  Advised patient we'll call her back tomorrow afternoon to confirm with her that all of her referrals have been made; and subsequent appointments are following.  Patient stated understanding of all instructions; was in agreement with this plan of care.

## 2014-11-25 NOTE — Telephone Encounter (Signed)
Verbal order received and read back from Dr. Lindi Adie for patient to come in to symptom management clinic tomorrow.  Reached patient at 1530.  Said she will be here at 0900 to leave no later than 10:30 am. Has a funeral to attend in [place] at 1100 am.

## 2014-11-25 NOTE — Telephone Encounter (Addendum)
Ms. Paula Navarro called "asking for pain medicine to be called to pharmacy [facility] in [place].  I can't take ultram and threw this away when Dr. Marlou Navarro prescribed this.  I'm taking advil but this I think is making me bleed more. Spotting every other day or daily.  Ears a pad over 24 hours.  (Has used all percocet 5-325 mg ordered 11-12-2014, take 1 po every 6 hours qty 50).  I bleed with the aromatase inhibitor so maybe I shouldn't take this medicine.  Pain in breast and my feet hurt worse.  I take the gabapentin 600 mg three times a day.  My hands and feet are worse with burning pain with swelling to both arms for the past 1 to 2 weeks.  Scheduled to see Dr. Marlou Navarro 12-01-2014."    S/P Bilateral lumpectomy with lymph nodes removed 04-08-2014.   This nurse discussed wearing gloves to wash dishes and drive.  Avoid hot water and friction with walking and use of hands.  Denies previous Physical therapy for lymphedema but does recall Dr. Marlou Navarro giving her a prescription for a compression sleeve.  Took script to retail pharmacy that wasn't able to service this and left script.    Will notify Dr. Lindi Navarro informing her she will have to pick up a prescription if MD orders refill or a new pain medicine.  "Just call something in it doesn't have to be strong.  As for the bleedinghe did tell me to see a gynecologist."    Return number [phone]

## 2014-11-26 ENCOUNTER — Other Ambulatory Visit: Payer: Medicare Other

## 2014-11-26 ENCOUNTER — Encounter: Payer: Medicare Other | Admitting: Nurse Practitioner

## 2014-11-27 ENCOUNTER — Telehealth: Payer: Self-pay | Admitting: *Deleted

## 2014-11-27 DIAGNOSIS — T7401XA Adult neglect or abandonment, confirmed, initial encounter: Secondary | ICD-10-CM

## 2014-11-27 NOTE — Telephone Encounter (Signed)
TC to pt to check status and advise referrals have been started. No VM set up.

## 2014-12-01 ENCOUNTER — Encounter (HOSPITAL_COMMUNITY): Payer: Self-pay

## 2014-12-01 ENCOUNTER — Telehealth: Payer: Self-pay | Admitting: Physical Therapy

## 2014-12-01 ENCOUNTER — Emergency Department (HOSPITAL_COMMUNITY): Payer: Medicare Other

## 2014-12-01 ENCOUNTER — Emergency Department (HOSPITAL_COMMUNITY)
Admission: EM | Admit: 2014-12-01 | Discharge: 2014-12-01 | Disposition: A | Discharge status: Home / Self Care | Payer: Medicare Other | Attending: Emergency Medicine | Admitting: Emergency Medicine

## 2014-12-01 DIAGNOSIS — G8929 Other chronic pain: Secondary | ICD-10-CM | POA: Diagnosis not present

## 2014-12-01 DIAGNOSIS — J449 Chronic obstructive pulmonary disease, unspecified: Secondary | ICD-10-CM | POA: Diagnosis not present

## 2014-12-01 DIAGNOSIS — Z87891 Personal history of nicotine dependence: Secondary | ICD-10-CM | POA: Diagnosis not present

## 2014-12-01 DIAGNOSIS — M19011 Primary osteoarthritis, right shoulder: Secondary | ICD-10-CM | POA: Diagnosis not present

## 2014-12-01 DIAGNOSIS — Z923 Personal history of irradiation: Secondary | ICD-10-CM | POA: Diagnosis not present

## 2014-12-01 DIAGNOSIS — F419 Anxiety disorder, unspecified: Secondary | ICD-10-CM | POA: Diagnosis not present

## 2014-12-01 DIAGNOSIS — Z853 Personal history of malignant neoplasm of breast: Secondary | ICD-10-CM | POA: Diagnosis not present

## 2014-12-01 DIAGNOSIS — Z862 Personal history of diseases of the blood and blood-forming organs and certain disorders involving the immune mechanism: Secondary | ICD-10-CM | POA: Insufficient documentation

## 2014-12-01 DIAGNOSIS — Z79899 Other long term (current) drug therapy: Secondary | ICD-10-CM | POA: Insufficient documentation

## 2014-12-01 DIAGNOSIS — M25511 Pain in right shoulder: Secondary | ICD-10-CM | POA: Insufficient documentation

## 2014-12-01 MED ORDER — OXYCODONE-ACETAMINOPHEN 5-325 MG PO TABS
1.0000 | ORAL_TABLET | Freq: Once | ORAL | Status: AC
  Administered 2014-12-01: 1 via ORAL
  Filled 2014-12-01: qty 1

## 2014-12-01 MED ORDER — ONDANSETRON HCL 4 MG PO TABS
4.0000 mg | ORAL_TABLET | Freq: Once | ORAL | Status: AC
  Administered 2014-12-01: 4 mg via ORAL
  Filled 2014-12-01: qty 1

## 2014-12-01 MED ORDER — OXYCODONE-ACETAMINOPHEN 5-325 MG PO TABS: 1.0000 | ORAL_TABLET | Freq: Four times a day (QID) | ORAL | Status: DC | PRN

## 2014-12-01 MED ORDER — DEXAMETHASONE SODIUM PHOSPHATE 4 MG/ML IJ SOLN
8.0000 mg | Freq: Once | INTRAMUSCULAR | Status: AC
  Administered 2014-12-01: 8 mg via INTRAMUSCULAR
  Filled 2014-12-01: qty 2

## 2014-12-01 NOTE — ED Notes (Addendum)
Pt reports she did a lot of mopping yesterday and c/o r shoulder pain that started last night.  Reports hurts worse with movement.    Pt says has had some chest pain last night but says has history of chest pain.  Denies chest pain today.

## 2014-12-01 NOTE — ED Notes (Signed)
Pt verbalized understanding of no driving and to use caution within 4 hours of taking pain meds due to meds cause drowsiness 

## 2014-12-01 NOTE — Telephone Encounter (Signed)
Called pt x 2 no answer, VM not set up

## 2014-12-01 NOTE — ED Provider Notes (Signed)
CSN: [id number]     Arrival date & time 12/01/14  1607 History   First MD Initiated Contact with Patient 12/01/14 1821     Chief Complaint  Patient presents with  . Shoulder Pain     (Consider location/radiation/quality/duration/timing/severity/associated sxs/prior Treatment) HPI Comments: Patient is a 64 year old female who presents to the emergency department with a complaint of right shoulder pain. The patient states that she was using a heavy mop to help a friend recently and now she has severe pain in her right shoulder. The patient states she has been treated for breast cancer, and had the lymph nodes removed. She states however that she is having pain with any movement of her arm following the heavy mopping. She has not had any injury or surgery directly related to the shoulder.  Patient is a 64 y.o. female presenting with shoulder pain. The history is provided by the patient.  Shoulder Pain Location:  Shoulder Time since incident:  1 day Shoulder location:  R shoulder   Past Medical History  Diagnosis Date  . Chronic bronchitis   . Palpitation     Tachycardia reported by monitor clerk during a symptomatic spell  . Chest pain     Admitted to [facility] in 09/2011; refused stress test  . Atrial septal defect 1996    Surgical repair in 1996  . Tobacco abuse     60 pack years; 1.5 packs per day  . Anxiety   . Anemia   . Breast cancer   . Wears dentures     top  . COPD (chronic obstructive pulmonary disease)     on xray  . Chronic pain   . Radiation 06/30/14-08/17/14    Bilateral Breast   Past Surgical History  Procedure Laterality Date  . Cholecystectomy    . Cesarean section      x3  . Breast biopsy Bilateral   . Tubal ligation    . Asd repair, ostium primum  1996    dr Roxy Horseman  . Port a cath revision  1/15    put in   . Breast lumpectomy with radioactive seed localization Bilateral 04/08/2014    Procedure: BILATERAL  RADIOACTIVE SEED LOCALIZATION LUMPECTOMY ;  Surgeon:  Autumn Messing III, MD;  Location: [facility];  Service: General;  Laterality: Bilateral;  . Axillary lymph node dissection Bilateral 04/08/2014    Procedure:  BILATERAL AXILLARY LYMPH NODE DISSECTION;  Surgeon: Autumn Messing III, MD;  Location: [facility];  Service: General;  Laterality: Bilateral;  . Open heart surgery     Family History  Problem Relation Age of Onset  . Breast cancer Maternal Aunt    History  Substance Use Topics  . Smoking status: Former Smoker -- 1.50 packs/day for 40 years    Types: Cigarettes    Quit date: 08/01/2013  . Smokeless tobacco: Never Used  . Alcohol Use: No   OB History    Gravida Para Term Preterm AB TAB SAB Ectopic Multiple Living   '4 3 3            '$ Review of Systems  Cardiovascular: Positive for palpitations.  Musculoskeletal: Positive for arthralgias.  All other systems reviewed and are negative.     Allergies  Codeine and Morphine and related  Home Medications   Prior to Admission medications   Medication Sig Start Date End Date Taking? Authorizing Provider  anastrozole (ARIMIDEX) 1 MG tablet Take 1 tablet (1 mg total) by mouth daily. 08/04/14  Yes Nicholas Lose, MD  B Complex-C (SUPER B COMPLEX PO) Take 1 tablet by mouth daily.   Yes Historical Provider, MD  clobetasol cream (TEMOVATE) 4.27 % Apply 1 application topically 2 (two) times daily. Apply to hands and feet twice a day as needed. 03/30/14  Yes Nicholas Lose, MD  gabapentin (NEURONTIN) 300 MG capsule TAKE 2 CAPSULES (600 MG TOTAL) BY MOUTH 3 (THREE) TIMES DAILY. 10/08/14  Yes Nicholas Lose, MD  ibuprofen (ADVIL,MOTRIN) 200 MG tablet Take 200 mg by mouth every 6 (six) hours as needed for moderate pain.    Yes Historical Provider, MD  lidocaine-prilocaine (EMLA) cream Apply 1 application topically as needed. 01/20/14  Yes Maryanna Shape, NP  loratadine (CLARITIN) 10 MG tablet Take 10 mg by mouth daily as needed for itching.   Yes Historical Provider, MD  LORazepam  (ATIVAN) 1 MG tablet Take 1 tablet (1 mg total) by mouth at bedtime. 11/12/14  Yes Nicholas Lose, MD  oxyCODONE-acetaminophen (PERCOCET/ROXICET) 5-325 MG per tablet Take 1 tablet by mouth every 6 (six) hours as needed for severe pain. Patient not taking: Reported on 12/01/2014 11/12/14   Nicholas Lose, MD   BP 148/92 mmHg  Pulse 87  Temp(Src) 99 F (37.2 C) (Oral)  Resp 20  Ht '5\' 2"'$  (1.575 m)  Wt 135 lb (61.236 kg)  BMI 24.69 kg/m2  SpO2 100%  LMP 05/20/2011 Physical Exam  Constitutional: She is oriented to person, place, and time. She appears well-developed and well-nourished.  Non-toxic appearance.  HENT:  Head: Normocephalic.  Right Ear: Tympanic membrane and external ear normal.  Left Ear: Tympanic membrane and external ear normal.  Eyes: EOM and lids are normal. Pupils are equal, round, and reactive to light.  Neck: Normal range of motion. Neck supple. Carotid bruit is not present.  Cardiovascular: Normal rate, regular rhythm, normal heart sounds, intact distal pulses and normal pulses.   Pulmonary/Chest: Breath sounds normal. No respiratory distress.  Abdominal: Soft. Bowel sounds are normal. There is no tenderness. There is no guarding.  Musculoskeletal:       Right shoulder: She exhibits decreased range of motion, tenderness, bony tenderness and pain.  Lymphadenopathy:       Head (right side): No submandibular adenopathy present.       Head (left side): No submandibular adenopathy present.    She has no cervical adenopathy.  Neurological: She is alert and oriented to person, place, and time. She has normal strength. No cranial nerve deficit or sensory deficit.  Skin: Skin is warm and dry.  Psychiatric: She has a normal mood and affect. Her speech is normal.  Nursing note and vitals reviewed.   ED Course  Procedures (including critical care time) Labs Review Labs Reviewed - No data to display  Imaging Review Dg Shoulder Right  12/01/2014   CLINICAL DATA:  Right shoulder  pain, history of breast cancer  EXAM: RIGHT SHOULDER - 2+ VIEW  COMPARISON:  03/20/2013  FINDINGS: No fracture or dislocation is seen.  Mild degenerative changes the glenohumeral joint.  Surgical clips in the right axilla.  Visualized right lung is clear.  Right chest power port, incompletely visualized.  IMPRESSION: No fracture or dislocation is seen.  Mild degenerative changes.   Electronically Signed   By: Julian Hy M.D.   On: 12/01/2014 17:19     EKG Interpretation None      MDM  Vital signs are nonacute. X-ray of the right shoulder reveals degenerative changes in the glenohumeral joint, but  no other changes noted. The plan at this time is for the patient be fitted with a sling that she will use with the next 5-7 days. She's given an injection in the emergency department of [facility], and a prescription for Percocet one every 6 hours. Patient is to follow-up with her primary physician in the office for additional evaluation and orthopedic referral.    Final diagnoses:  None    *I have reviewed nursing notes, vital signs, and all appropriate lab and imaging results for this patient.Lily Kocher, PA-C 12/01/14 1925  Elnora Morrison, MD 12/02/14 [phone]

## 2014-12-01 NOTE — Discharge Instructions (Signed)
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 I suspect that you aggravated your arthritis, and or developed a mild tendinitis in the right shoulder. It is important that you discuss this with Dr. Emilee Hero, and determine if orthopedic referral may be needed. Shoulder Pain The shoulder is the joint that connects your arm to your body. Muscles and band-like tissues that connect bones to muscles (tendons) hold the joint together. Shoulder pain is felt if an injury or medical problem affects one or more parts of the shoulder. HOME CARE   Put ice on the sore area.  Put ice in a plastic bag.  Place a towel between your skin and the bag.  Leave the ice on for 15-20 minutes, 03-04 times a day for the first 2 days.  Stop using cold packs if they do not help with the pain.  If you were given something to keep your shoulder from moving (sling; shoulder immobilizer), wear it as told. Only take it off to shower or bathe.  Move your arm as little as possible, but keep your hand moving to prevent puffiness (swelling).  Squeeze a soft ball or foam pad as much as possible to help prevent swelling.  Take medicine as told by your doctor. GET HELP IF:  You have progressing new pain in your arm, hand, or fingers.  Your hand or fingers get cold.  Your medicine does not help lessen your pain. GET HELP RIGHT AWAY IF:   Your arm, hand, or fingers are numb or tingling.  Your arm, hand, or fingers are puffy (swollen), painful, or turn white or blue. MAKE SURE YOU:   Understand these instructions.  Will watch your condition.  Will get help right away if you are not doing well or get worse. Document Released: 01/03/2008 Document Revised: 12/01/2013 Document Reviewed: 01/29/2012 [facility] Patient Information 2015 [place], [place]. This information is not intended to replace advice given to you by your health care provider. Make sure you discuss any questions you have with your health care  provider.  Osteoarthritis Osteoarthritis is a disease that causes soreness and inflammation of a joint. It occurs when the cartilage at the affected joint wears down. Cartilage acts as a cushion, covering the ends of bones where they meet to form a joint. Osteoarthritis is the most common form of arthritis. It often occurs in older people. The joints affected most often by this condition include those in the:  Ends of the fingers.  Thumbs.  Neck.  Lower back.  Knees.  Hips. CAUSES  Over time, the cartilage that covers the ends of bones begins to wear away. This causes bone to rub on bone, producing pain and stiffness in the affected joints.  RISK FACTORS Certain factors can increase your chances of having osteoarthritis, including:  Older age.  Excessive body weight.  Overuse of joints.  Previous joint injury. SIGNS AND SYMPTOMS   Pain, swelling, and stiffness in the joint.  Over time, the joint may lose its normal shape.  Small deposits of bone (osteophytes) may grow on the edges of the joint.  Bits of bone or cartilage can break off and float inside the joint space. This may cause more pain and damage. DIAGNOSIS  Your health care provider will do a physical exam and ask about your symptoms. Various tests may be ordered, such as:  X-rays of the affected joint.  An MRI scan.  Blood tests to rule out other types of arthritis.  Joint fluid tests. This involves using  a needle to draw fluid from the joint and examining the fluid under a microscope. TREATMENT  Goals of treatment are to control pain and improve joint function. Treatment plans may include:  A prescribed exercise program that allows for rest and joint relief.  A weight control plan.  Pain relief techniques, such as:  Properly applied heat and cold.  Electric pulses delivered to nerve endings under the skin (transcutaneous electrical nerve stimulation [TENS]).  Massage.  Certain nutritional  supplements.  Medicines to control pain, such as:  Acetaminophen.  Nonsteroidal anti-inflammatory drugs (NSAIDs), such as naproxen.  Narcotic or central-acting agents, such as tramadol.  Corticosteroids. These can be given orally or as an injection.  Surgery to reposition the bones and relieve pain (osteotomy) or to remove loose pieces of bone and cartilage. Joint replacement may be needed in advanced states of osteoarthritis. HOME CARE INSTRUCTIONS   Take medicines only as directed by your health care provider.  Maintain a healthy weight. Follow your health care provider's instructions for weight control. This may include dietary instructions.  Exercise as directed. Your health care provider can recommend specific types of exercise. These may include:  Strengthening exercises. These are done to strengthen the muscles that support joints affected by arthritis. They can be performed with weights or with exercise bands to add resistance.  Aerobic activities. These are exercises, such as brisk walking or low-impact aerobics, that get your heart pumping.  Range-of-motion activities. These keep your joints limber.  Balance and agility exercises. These help you maintain daily living skills.  Rest your affected joints as directed by your health care provider.  Keep all follow-up visits as directed by your health care provider. SEEK MEDICAL CARE IF:   Your skin turns red.  You develop a rash in addition to your joint pain.  You have worsening joint pain.  You have a fever along with joint or muscle aches. SEEK IMMEDIATE MEDICAL CARE IF:  You have a significant loss of weight or appetite.  You have night sweats. [place] of Arthritis and Musculoskeletal and Skin Diseases: www.niams.SouthExposed.es  [employer] on Aging: http://kim-miller.com/  American College of Rheumatology: www.rheumatology.org Document Released: 07/17/2005 Document Revised:  12/01/2013 Document Reviewed: 03/24/2013 [facility] Patient Information 2015 [place], [place]. This information is not intended to replace advice given to you by your health care provider. Make sure you discuss any questions you have with your health care provider.

## 2014-12-02 ENCOUNTER — Encounter (HOSPITAL_COMMUNITY): Payer: Self-pay | Admitting: Emergency Medicine

## 2014-12-02 ENCOUNTER — Emergency Department (HOSPITAL_COMMUNITY)
Admission: EM | Admit: 2014-12-02 | Discharge: 2014-12-03 | Disposition: A | Discharge status: Home / Self Care | Payer: Medicare Other | Attending: Emergency Medicine | Admitting: Emergency Medicine

## 2014-12-02 DIAGNOSIS — R112 Nausea with vomiting, unspecified: Secondary | ICD-10-CM | POA: Diagnosis present

## 2014-12-02 DIAGNOSIS — F419 Anxiety disorder, unspecified: Secondary | ICD-10-CM | POA: Diagnosis not present

## 2014-12-02 DIAGNOSIS — R109 Unspecified abdominal pain: Secondary | ICD-10-CM | POA: Insufficient documentation

## 2014-12-02 DIAGNOSIS — Z9889 Other specified postprocedural states: Secondary | ICD-10-CM | POA: Insufficient documentation

## 2014-12-02 DIAGNOSIS — M25511 Pain in right shoulder: Secondary | ICD-10-CM | POA: Insufficient documentation

## 2014-12-02 DIAGNOSIS — G8929 Other chronic pain: Secondary | ICD-10-CM | POA: Insufficient documentation

## 2014-12-02 DIAGNOSIS — Z79899 Other long term (current) drug therapy: Secondary | ICD-10-CM | POA: Diagnosis not present

## 2014-12-02 DIAGNOSIS — J449 Chronic obstructive pulmonary disease, unspecified: Secondary | ICD-10-CM | POA: Diagnosis not present

## 2014-12-02 DIAGNOSIS — Q211 Atrial septal defect: Secondary | ICD-10-CM | POA: Insufficient documentation

## 2014-12-02 DIAGNOSIS — Z853 Personal history of malignant neoplasm of breast: Secondary | ICD-10-CM | POA: Insufficient documentation

## 2014-12-02 DIAGNOSIS — Z862 Personal history of diseases of the blood and blood-forming organs and certain disorders involving the immune mechanism: Secondary | ICD-10-CM | POA: Insufficient documentation

## 2014-12-02 DIAGNOSIS — Z87891 Personal history of nicotine dependence: Secondary | ICD-10-CM | POA: Diagnosis not present

## 2014-12-02 MED ORDER — ONDANSETRON HCL 4 MG/2ML IJ SOLN: 4.0000 mg | Freq: Once | INTRAMUSCULAR | Status: DC

## 2014-12-02 MED ORDER — ONDANSETRON 8 MG PO TBDP
8.0000 mg | ORAL_TABLET | Freq: Once | ORAL | Status: AC
  Administered 2014-12-02: 8 mg via ORAL
  Filled 2014-12-02: qty 1

## 2014-12-02 MED ORDER — SODIUM CHLORIDE 0.9 % IV BOLUS (SEPSIS): 1000.0000 mL | Freq: Once | INTRAVENOUS | Status: DC

## 2014-12-02 NOTE — ED Notes (Addendum)
Patient refusing IV. Patient states that she has a port and lymphedema in extremities and she does not want anyone sticking her for blood work or an IV. Patient also refusing to let us access her port unless we numb her first for 20 minutes with cream. Dr. Wilson Singer notified and stated that orders could be canceled and to give patient 8 mg ODT zofran

## 2014-12-02 NOTE — ED Provider Notes (Signed)
CSN: [id number]     Arrival date & time 12/02/14  1936 History   First MD Initiated Contact with Patient 12/02/14 2119    This chart was scribed for Virgel Manifold, MD by Terressa Koyanagi, ED Scribe. This patient was seen in room APA09/[facility]09 and the patient's care was started at 9:38 PM.  Chief Complaint  Patient presents with  . Abdominal Pain   The history is provided by the patient. No language interpreter was used.   PCP: Lanette Hampshire, MD HPI Comments: Paula Navarro is a 64 y.o. female, with PMH noted below including breast cancer (not in [place] presently), breast lumpectomy w/radioactive seed localization, cholecystectomy and 3 cesarean sections, who presents to the Emergency Department complaining of 5 episodes of vomiting onset today. Pt reports she ate too much today and believes that may be the cause of her Sx. Pt reports ingesting table salt mixed with water to alleviate her Sx without relief. Pt further reports: (1) 5 episodes of diarrhea yesterday which has now resolved; (2) abd pain onset today prior to the first episode of vomiting which has now resolved. Pt denies fever, chills, or any other Sx.   Pt also complains of persistent right shoulder pain; pt was seen for the same at the ED yesterday.  Past Medical History  Diagnosis Date  . Chronic bronchitis   . Palpitation     Tachycardia reported by monitor clerk during a symptomatic spell  . Chest pain     Admitted to [facility] in 09/2011; refused stress test  . Atrial septal defect 1996    Surgical repair in 1996  . Tobacco abuse     60 pack years; 1.5 packs per day  . Anxiety   . Anemia   . Breast cancer   . Wears dentures     top  . COPD (chronic obstructive pulmonary disease)     on xray  . Chronic pain   . Radiation 06/30/14-08/17/14    Bilateral Breast   Past Surgical History  Procedure Laterality Date  . Cholecystectomy    . Cesarean section      x3  . Breast biopsy Bilateral   . Tubal ligation    . Asd repair, ostium  primum  1996    dr Roxy Horseman  . Port a cath revision  1/15    put in   . Breast lumpectomy with radioactive seed localization Bilateral 04/08/2014    Procedure: BILATERAL  RADIOACTIVE SEED LOCALIZATION LUMPECTOMY ;  Surgeon: Autumn Messing III, MD;  Location: [place];  Service: General;  Laterality: Bilateral;  . Axillary lymph node dissection Bilateral 04/08/2014    Procedure:  BILATERAL AXILLARY LYMPH NODE DISSECTION;  Surgeon: Autumn Messing III, MD;  Location: [place];  Service: General;  Laterality: Bilateral;  . Open heart surgery     Family History  Problem Relation Age of Onset  . Breast cancer Maternal Aunt    History  Substance Use Topics  . Smoking status: Former Smoker -- 1.50 packs/day for 40 years    Types: Cigarettes    Quit date: 08/01/2013  . Smokeless tobacco: Never Used  . Alcohol Use: No   OB History    Gravida Para Term Preterm AB TAB SAB Ectopic Multiple Living   '4 3 3            '$ Review of Systems  Constitutional: Negative for fever and chills.  Gastrointestinal: Positive for nausea and vomiting. Negative for abdominal pain (pain  earlier today now resolved) and diarrhea (5 episodes yesterday now resolved ).  Musculoskeletal:       Right shoulder pain    Neurological: Negative for speech difficulty.  Psychiatric/Behavioral: Negative for confusion.  All other systems reviewed and are negative.     Allergies  Codeine and Morphine and related  Home Medications   Prior to Admission medications   Medication Sig Start Date End Date Taking? Authorizing Provider  anastrozole (ARIMIDEX) 1 MG tablet Take 1 tablet (1 mg total) by mouth daily. 08/04/14  Yes Nicholas Lose, MD  B Complex-C (SUPER B COMPLEX PO) Take 1 tablet by mouth daily.   Yes Historical Provider, MD  clobetasol cream (TEMOVATE) 3.32 % Apply 1 application topically 2 (two) times daily. Apply to hands and feet twice a day as needed. 03/30/14  Yes Nicholas Lose, MD  gabapentin  (NEURONTIN) 300 MG capsule TAKE 2 CAPSULES (600 MG TOTAL) BY MOUTH 3 (THREE) TIMES DAILY. 10/08/14  Yes Nicholas Lose, MD  ibuprofen (ADVIL,MOTRIN) 200 MG tablet Take 200 mg by mouth every 6 (six) hours as needed for moderate pain.    Yes Historical Provider, MD  lidocaine-prilocaine (EMLA) cream Apply 1 application topically as needed. 01/20/14  Yes Maryanna Shape, NP  loratadine (CLARITIN) 10 MG tablet Take 10 mg by mouth daily as needed for itching.   Yes Historical Provider, MD  LORazepam (ATIVAN) 1 MG tablet Take 1 tablet (1 mg total) by mouth at bedtime. 11/12/14  Yes Nicholas Lose, MD  oxyCODONE-acetaminophen (PERCOCET/ROXICET) 5-325 MG per tablet Take 1 tablet by mouth every 6 (six) hours as needed. 12/01/14  Yes Lily Kocher, PA-C   Triage Vitals: BP 147/98 mmHg  Pulse 107  Temp(Src) 100 F (37.8 C) (Oral)  Resp 20  Wt 135 lb (61.236 kg)  SpO2 94%  LMP 05/20/2011 Physical Exam  Constitutional: She is oriented to person, place, and time. She appears well-developed and well-nourished. No distress.  HENT:  Head: Normocephalic and atraumatic.  Eyes: Conjunctivae and EOM are normal.  Neck: Neck supple.  Cardiovascular: Normal rate.   Pulmonary/Chest: Effort normal. No respiratory distress.  Abdominal: Soft. She exhibits no distension. There is no tenderness.  Musculoskeletal: Normal range of motion.  Neurological: She is alert and oriented to person, place, and time.  Skin: Skin is warm and dry.  Psychiatric: She has a normal mood and affect. Her behavior is normal.  Nursing note and vitals reviewed.   ED Course  Procedures (including critical care time) DIAGNOSTIC STUDIES: Oxygen Saturation is 94% on RA, adequate by my interpretation.    COORDINATION OF CARE: 9:44 PM-Discussed treatment plan which includes meds with pt at bedside and pt agreed to plan.   Labs Review Labs Reviewed - No data to display  Imaging Review Dg Shoulder Right  12/01/2014   CLINICAL DATA:  Right  shoulder pain, history of breast cancer  EXAM: RIGHT SHOULDER - 2+ VIEW  COMPARISON:  03/20/2013  FINDINGS: No fracture or dislocation is seen.  Mild degenerative changes the glenohumeral joint.  Surgical clips in the right axilla.  Visualized right lung is clear.  Right chest power port, incompletely visualized.  IMPRESSION: No fracture or dislocation is seen.  Mild degenerative changes.   Electronically Signed   By: Julian Hy M.D.   On: 12/01/2014 17:19     EKG Interpretation None      MDM   Final diagnoses:  Non-intractable vomiting with nausea, vomiting of unspecified type    I personally preformed  the services scribed in my presence. The recorded information has been reviewed is accurate. Virgel Manifold, MD.     Virgel Manifold, MD 12/09/14 [phone]

## 2014-12-02 NOTE — ED Notes (Signed)
Patient c/o abdominal pain; states started vomiting today.  Patient states was seen here yesterday for abdominal pain with diarrhea.

## 2014-12-03 ENCOUNTER — Telehealth: Payer: Self-pay

## 2014-12-03 ENCOUNTER — Telehealth: Payer: Self-pay | Admitting: Physical Therapy

## 2014-12-03 MED ORDER — ONDANSETRON HCL 4 MG PO TABS: 4.0000 mg | ORAL_TABLET | Freq: Four times a day (QID) | ORAL | Status: DC

## 2014-12-03 MED ORDER — OXYCODONE-ACETAMINOPHEN 5-325 MG PO TABS
1.0000 | ORAL_TABLET | Freq: Once | ORAL | Status: AC
  Administered 2014-12-03: 1 via ORAL
  Filled 2014-12-03: qty 1

## 2014-12-03 NOTE — ED Notes (Signed)
Discharge papers received from MD.

## 2014-12-03 NOTE — Telephone Encounter (Signed)
After hours call report received from [facility] pt call in 12/02/14 - 7 pm.  Sent to scan.

## 2014-12-03 NOTE — Telephone Encounter (Signed)
Called pt again today, phone not taking messages

## 2014-12-03 NOTE — Discharge Instructions (Signed)

## 2014-12-07 ENCOUNTER — Telehealth: Payer: Self-pay | Admitting: *Deleted

## 2014-12-07 ENCOUNTER — Telehealth: Payer: Self-pay

## 2014-12-07 NOTE — Telephone Encounter (Signed)
VM message from patient stating that she is having pain in her right and left arm. Reviewed recent notes from Selena Lesser, NP who saw Ms. Sparrow on 11/25/14. Referrals have been made to pain management clinic, lymphedema clinic for similar issues.  Attempted call back to patient. No answer and unable to leave voice mail.

## 2014-12-07 NOTE — Telephone Encounter (Signed)
TC from patient this afternoon. She has been calling here d/t pain in her left > r arm. But our return calls to her go unanswered and she does not have voice mail set up.  Explained to pt that we have been trying to call her back-several times. Pt states she falls asleep and doesn't hear her phone ring. Encouraged to get some help with setting up her voice mail at the very least so that she can get messages.  She does state her arms are hurting, the left> right and that she is having trouble raising her arms at all. Informed pt that the Lymphedema Clinic has been trying to call her as well as Physical Therapist, but that she doesn't answer and no one has been able to leave messages. Reminded her of the importance in following up on those calls and clinics (as well as Pain clinic) in order to get her discomfort and mobility issues worked on.  Patient states she will be calling back the phone #'s that have appeared on her phone and set up appointments.  Patient states that she had some xrays done of L shoulder, and that they revealed only some arthritis.  She states she is taking Advil with some relief in the pain.  Again reinforced the importance of following up with referrals to Lymphedema Clinic, Pain Clinic and Physical Therapist.  She verbalized understanding. Pt denied need for written pain medicine prescription today.

## 2014-12-07 NOTE — Telephone Encounter (Signed)
lvm with son that pt's voice mail is not set up. Rehab has tried to call her for lymphedema evaluation. [facility] has tried to call her to help answer her questions about pain. A pain clinic referral is in progress. Please help pt set up voice mail. Sent POF for pain clinic referral of 4/27 to see if it was scheduled

## 2014-12-08 ENCOUNTER — Telehealth: Payer: Self-pay | Admitting: *Deleted

## 2014-12-08 NOTE — Telephone Encounter (Signed)
[facility] are getting [facility] to do a TB tine test and she is asking if this is ok.  She states she finished chemotherapy and radiation 6 months ago.  Let her know that as far as I am aware it is fine to do the SQ TB tine test.  She appreciated the information.

## 2014-12-09 ENCOUNTER — Ambulatory Visit: Payer: Medicare Other | Attending: General Surgery | Admitting: Physical Therapy

## 2014-12-09 ENCOUNTER — Encounter: Payer: Self-pay | Admitting: Physical Therapy

## 2014-12-09 ENCOUNTER — Other Ambulatory Visit: Payer: Self-pay | Admitting: Hematology and Oncology

## 2014-12-09 DIAGNOSIS — R6889 Other general symptoms and signs: Secondary | ICD-10-CM

## 2014-12-09 DIAGNOSIS — C50912 Malignant neoplasm of unspecified site of left female breast: Secondary | ICD-10-CM | POA: Insufficient documentation

## 2014-12-09 DIAGNOSIS — M25611 Stiffness of right shoulder, not elsewhere classified: Secondary | ICD-10-CM

## 2014-12-09 DIAGNOSIS — C50519 Malignant neoplasm of lower-outer quadrant of unspecified female breast: Secondary | ICD-10-CM

## 2014-12-09 DIAGNOSIS — Z9189 Other specified personal risk factors, not elsewhere classified: Secondary | ICD-10-CM

## 2014-12-09 DIAGNOSIS — M25612 Stiffness of left shoulder, not elsewhere classified: Secondary | ICD-10-CM

## 2014-12-09 DIAGNOSIS — M25511 Pain in right shoulder: Secondary | ICD-10-CM

## 2014-12-09 DIAGNOSIS — M25512 Pain in left shoulder: Secondary | ICD-10-CM

## 2014-12-09 NOTE — Therapy (Signed)
[place], [place], [address] Phone: [phone]   Fax:  [phone]  Physical Therapy Evaluation  Patient Details  Name: Paula Navarro MRN: [id number] Date of Birth: 05/09/1951 Referring Provider:  Jovita Kussmaul, MD  Encounter Date: 12/09/2014      PT End of Session - 12/09/14 1638    PT Start Time 0852   PT Stop Time 0937   PT Time Calculation (min) 45 min   Activity Tolerance Patient tolerated treatment well   Behavior During Therapy [facility] for tasks assessed/performed      Past Medical History  Diagnosis Date  . Chronic bronchitis   . Palpitation     Tachycardia reported by monitor clerk during a symptomatic spell  . Chest pain     Admitted to [facility] in 09/2011; refused stress test  . Atrial septal defect 1996    Surgical repair in 1996  . Tobacco abuse     60 pack years; 1.5 packs per day  . Anxiety   . Anemia   . Breast cancer   . Wears dentures     top  . COPD (chronic obstructive pulmonary disease)     on xray  . Chronic pain   . Radiation 06/30/14-08/17/14    Bilateral Breast    Past Surgical History  Procedure Laterality Date  . Cholecystectomy    . Cesarean section      x3  . Breast biopsy Bilateral   . Tubal ligation    . Asd repair, ostium primum  1996    dr Roxy Horseman  . Port a cath revision  1/15    put in   . Breast lumpectomy with radioactive seed localization Bilateral 04/08/2014    Procedure: BILATERAL  RADIOACTIVE SEED LOCALIZATION LUMPECTOMY ;  Surgeon: Autumn Messing III, MD;  Location: [place];  Service: General;  Laterality: Bilateral;  . Axillary lymph node dissection Bilateral 04/08/2014    Procedure:  BILATERAL AXILLARY LYMPH NODE DISSECTION;  Surgeon: Autumn Messing III, MD;  Location: [place];  Service: General;  Laterality: Bilateral;  . Open heart surgery      There were no vitals filed for this visit.  Visit Diagnosis:  Stiffness  of joint, shoulder region, left - Plan: PT plan of care cert/re-cert  Stiffness of joint, shoulder region, right - Plan: PT plan of care cert/re-cert  Pain in joint involving shoulder region, left - Plan: PT plan of care cert/re-cert  Pain in joint involving shoulder region, right - Plan: PT plan of care cert/re-cert  Impaired function of upper extremity - Plan: PT plan of care cert/re-cert  At risk for lymphedema - Plan: PT plan of care cert/re-cert      Subjective Assessment - 12/09/14 0903    Subjective Numbness and pain in arms and impaired function in both.   Pertinent History Approx. 8 months s/p bilateral lumpectomies with axillary node dissections (11 right, 3-6 on left) for bilateral breast cancer; neoadjuvant chemotherapy completed July 2015; radiation completed about a month ago to both breasts.  Got shingles in the left arm but that's now healed.  h/o open heart surgery for septal defect 1996.   Patient Stated Goals check for swelling, help with pain and function   Currently in Pain? Yes   Pain Score 3    Pain Location Shoulder   Pain Orientation Right;Left   Pain Descriptors / Indicators Sore;Aching   Aggravating Factors  heavy lifting  Pain Relieving Factors advil            [facility] PT Assessment - 12/09/14 0001    Assessment   Medical Diagnosis bilateral breast cancer   Precautions   Precautions Other (comment)  cancer precautions   Restrictions   Weight Bearing Restrictions No   Balance Screen   Has the patient fallen in the past 6 months No   Has the patient had a decrease in activity level because of a fear of falling?  No   Is the patient reluctant to leave their home because of a fear of falling?  No   Home Environment   Living Enviornment Private residence   Living Arrangements Parent  she takes care of her 58 year-old mother   Type of [place]  rental house with leaking [place] One level   Prior Function   Level of Independence  Independent with basic ADLs;Independent with homemaking with ambulation;Independent with gait   Vocation Retired;On disability   Leisure no regular exercise except household chores; likes to go for drives with her mother; active in church circle   Observation/Other Assessments   Observations 4 inch scar at sternum from h/o open heart surgery   Quick DASH  64   Posture/Postural Control   Posture/Postural Control Postural limitations   Postural Limitations Rounded Shoulders;Forward head  elevated shoulders   ROM / Strength   AROM / PROM / Strength AROM   AROM   AROM Assessment Site Shoulder   Right/Left Shoulder Right;Left   Right Shoulder Flexion 127 Degrees   Right Shoulder ABduction 170 Degrees  toward scaption   Right Shoulder Internal Rotation 68 Degrees  supine   Right Shoulder External Rotation 70 Degrees   Left Shoulder Flexion 120 Degrees  painful   Left Shoulder ABduction 96 Degrees  toward scaption; painful   Left Shoulder Internal Rotation 73 Degrees  painful; supine   Left Shoulder External Rotation 50 Degrees  painful   Palpation   Palpation reports numbness in both arms from about elbows proximal   Ambulation/Gait   Ambulation/Gait Yes   Ambulation/Gait Assistance 7: Independent           LYMPHEDEMA/ONCOLOGY QUESTIONNAIRE - 12/09/14 0913    Type   Cancer Type bilateral breast   Surgeries   Lumpectomy Date 03/31/14  approx.   Treatment   Past Chemotherapy Treatment Yes   Date 03/14/14  approx.   Past Radiation Treatment Yes   Date 10/30/14  approx.   What other symptoms do you have   Are you having pitting edema Yes   Body Site right forearm--mild   Stemmer Sign No   Lymphedema Assessments   Lymphedema Assessments Upper extremities   Right Upper Extremity Lymphedema   10 cm Proximal to Olecranon Process 28.2 cm   Olecranon Process 22.6 cm   10 cm Proximal to Ulnar Styloid Process 19.8 cm   Just Proximal to Ulnar Styloid Process 14.3 cm    Across Hand at [employer] 18 cm   At [place] of 2nd Digit 6.1 cm   Other pt. feels swelling at right axilla   Left Upper Extremity Lymphedema   10 cm Proximal to Olecranon Process 28.2 cm   Olecranon Process 22.4 cm   10 cm Proximal to Ulnar Styloid Process 19.1 cm   Just Proximal to Ulnar Styloid Process 13.9 cm   Across Hand at [employer] 17.2 cm   At [place] of 2nd Digit 5.8 cm  Other appears swollen at left inferior axilla           Katina Dung - 2015/01/04 0001    Open a tight or new jar Moderate difficulty   Do heavy household chores (wash walls, wash floors) Moderate difficulty   Carry a shopping bag or briefcase Moderate difficulty   Wash your back Moderate difficulty   Use a knife to cut food Moderate difficulty   During the past week, to what extent has your arm, shoulder or hand problem interfered with your normal social activities with family, friends, neighbors, or groups? Extremely   During the past week, to what extent has your arm, shoulder or hand problem limited your work or other regular daily activities Quite a bit   Arm, shoulder, or hand pain. Extreme   Tingling (pins and needles) in your arm, shoulder, or hand Extreme   Difficulty Sleeping So much difficuSo much difficulty, I can't sleep   DASH Score 63.64 %                             Long Term Clinic Goals - 2015/01/04 1645    CC Long Term Goal  #1   Title reduce dash score to 20 or less indicating improved function of arms   Baseline 64   Time 4   Period Weeks   Status New   CC Long Term Goal  #2   Title left shoulder abduction to at least 140 degrees for improved ADLs   Baseline 96 degrees compared to 170 on opposite side   Time 4   Period Weeks   Status New   CC Long Term Goal  #3   Title bilateral shoulder flexion to at least 140 degrees for improved overhead reach   Baseline 127 right, 120 left   Time 4   Period Weeks   Status New   CC Long Term Goal  #4   Title  patient will be knowledgeable about lymphedema risk reduction practices, where and how to obtain compression sleeves   Time 4   Period Weeks   Status New   CC Long Term Goal  #5   Title report pain decrease of at least 60%   Time 4   Period Weeks   Status New            Plan - 01-04-15 1638    Clinical Impression Statement Patient with bilateral lumpectomies for bilateral breast cancer some 8 months ago; lymph nodes removed on both sides but right more than left.  She is having numbness and discomfort in both arms.  She may have swelling at both axillae/lateral chest areas and does not appear to have swelling in her arms at this time, though this merits monitoring.   Pt will benefit from skilled therapeutic intervention in order to improve on the following deficits Pain;Decreased range of motion;Impaired UE functional use;Increased edema   Rehab Potential Good   PT Frequency 2x / week   PT Duration 4 weeks   PT Treatment/Interventions Passive range of motion;Manual techniques;Therapeutic exercise;Patient/family education;Manual lymph drainage;DME Instruction;Other (comment)  lymphedema risk reduction practices   PT Next Visit Plan Begin bilateral shoulder P/AA/AROM and HEP instruction; manual lymph drainage especially for bilateral chest areas, directing towards groins.   Consulted and Agree with Plan of Care Patient          G-Codes - 01/04/2015 1648    Functional Assessment Tool Used quick dash  Functional Limitation Carrying, moving and handling objects   Carrying, Moving and Handling Objects Current Status [id number]) At least 60 percent but less than 80 percent impaired, limited or restricted   Carrying, Moving and Handling Objects Goal Status ([id number]) At least 20 percent but less than 40 percent impaired, limited or restricted       Problem List Patient Active Problem List   Diagnosis Date Noted  . Rash 09/24/2014  . Vaginal bleeding 09/24/2014  . Postherpetic neuralgia  09/03/2014  . Lymphedema 09/03/2014  . Suspected herpes zoster left C5 distribution 08/14/2014  . Neuropathy due to chemotherapeutic drug 03/03/2014  . Hand foot syndrome 03/03/2014  . Anxiety 03/03/2014  . Bilateral breast cancer 08/11/2013  . Palpitation   . Chest pain   . Atrial septal defect   . Laboratory test 11/25/2011  . Chronic bronchitis   . Tobacco abuse     Jermale Crass 12/09/2014, 4:51 PM  [place], [place], [address] Phone: [phone]   Fax:  [place], PT 12/09/2014 4:52 PM

## 2014-12-12 ENCOUNTER — Other Ambulatory Visit: Payer: Self-pay | Admitting: Hematology and Oncology

## 2014-12-14 ENCOUNTER — Ambulatory Visit: Payer: Medicare Other | Admitting: Physical Therapy

## 2014-12-14 NOTE — Telephone Encounter (Signed)
Phone to pharmacy 12/14/14 540 pm.

## 2014-12-23 ENCOUNTER — Ambulatory Visit: Payer: Medicare Other | Admitting: Physical Therapy

## 2014-12-23 DIAGNOSIS — R6889 Other general symptoms and signs: Secondary | ICD-10-CM

## 2014-12-23 DIAGNOSIS — C50912 Malignant neoplasm of unspecified site of left female breast: Secondary | ICD-10-CM | POA: Diagnosis not present

## 2014-12-23 DIAGNOSIS — Z9189 Other specified personal risk factors, not elsewhere classified: Secondary | ICD-10-CM

## 2014-12-23 NOTE — Therapy (Signed)
[place], [place], [address] Phone: [phone]   Fax:  [phone]  Physical Therapy Treatment  Patient Details  Name: Paula Navarro MRN: [id number] Date of Birth: 1951-06-24 Referring Provider:  Marjean Isobel Eisenhuth, MD  Encounter Date: 12/23/2014      PT End of Session - 12/23/14 1222    Visit Number 2   Number of Visits 9   Date for PT Re-Evaluation 01/08/15   PT Start Time 1025   PT Stop Time 1108   PT Time Calculation (min) 43 min   Activity Tolerance Patient tolerated treatment well   Behavior During Therapy [facility] for tasks assessed/performed      Past Medical History  Diagnosis Date  . Chronic bronchitis   . Palpitation     Tachycardia reported by monitor clerk during a symptomatic spell  . Chest pain     Admitted to [facility] in 09/2011; refused stress test  . Atrial septal defect 1996    Surgical repair in 1996  . Tobacco abuse     60 pack years; 1.5 packs per day  . Anxiety   . Anemia   . Breast cancer   . Wears dentures     top  . COPD (chronic obstructive pulmonary disease)     on xray  . Chronic pain   . Radiation 06/30/14-08/17/14    Bilateral Breast    Past Surgical History  Procedure Laterality Date  . Cholecystectomy    . Cesarean section      x3  . Breast biopsy Bilateral   . Tubal ligation    . Asd repair, ostium primum  1996    dr Roxy Horseman  . Port a cath revision  1/15    put in   . Breast lumpectomy with radioactive seed localization Bilateral 04/08/2014    Procedure: BILATERAL  RADIOACTIVE SEED LOCALIZATION LUMPECTOMY ;  Surgeon: Autumn Messing III, MD;  Location: [place];  Service: General;  Laterality: Bilateral;  . Axillary lymph node dissection Bilateral 04/08/2014    Procedure:  BILATERAL AXILLARY LYMPH NODE DISSECTION;  Surgeon: Autumn Messing III, MD;  Location: [place];  Service: General;  Laterality: Bilateral;  . Open heart surgery       There were no vitals filed for this visit.  Visit Diagnosis:  At risk for lymphedema  Impaired function of upper extremity      Subjective Assessment - 12/23/14 1030    Subjective Feels like the left arm may have swollen more since she was last here.   Currently in Pain? Yes   Pain Score 3    Pain Location Axilla  and chest   Pain Orientation Left   Pain Descriptors / Indicators Other (Comment)  "not pain, just hurting"   Aggravating Factors  using the arm               LYMPHEDEMA/ONCOLOGY QUESTIONNAIRE - 12/23/14 1034    Right Upper Extremity Lymphedema   Olecranon Process 23.6 cm   10 cm Proximal to Ulnar Styloid Process 19.9 cm   Just Proximal to Ulnar Styloid Process 14.6 cm   Across Hand at [employer] 18.3 cm   At [place] of 2nd Digit 6.2 cm   Left Upper Extremity Lymphedema   10 cm Proximal to Olecranon Process 29 cm   Olecranon Process 22.8 cm   10 cm Proximal to Ulnar Styloid Process 19.7 cm   Just Proximal to Ulnar Styloid Process  14.5 cm   Across Hand at [employer] 17.2 cm   At [place] of 2nd Digit 5.9 cm                  [facility] Adult PT Treatment/Exercise - 12/23/14 0001    Manual Therapy   Manual Therapy Edema management;Manual Lymphatic Drainage (MLD)   Edema Management circumference measurements taken of both arms, and they are up slightly, on left more than on right   Manual Lymphatic Drainage (MLD) short neck, superficial and deep abdomen, right groin and axillo-inguinal anastomosis, and right UE from dorsal hand to shoulder, then same on left side; explained principles of manual lymph drainage to patient and taught her diaphragmatic breathing today.                        [facility] Goals - 12/09/14 1645    CC Long Term Goal  #1   Title reduce dash score to 20 or less indicating improved function of arms   Baseline 64   Time 4   Period Weeks   Status New   CC Long Term Goal  #2   Title left shoulder  abduction to at least 140 degrees for improved ADLs   Baseline 96 degrees compared to 170 on opposite side   Time 4   Period Weeks   Status New   CC Long Term Goal  #3   Title bilateral shoulder flexion to at least 140 degrees for improved overhead reach   Baseline 127 right, 120 left   Time 4   Period Weeks   Status New   CC Long Term Goal  #4   Title patient will be knowledgeable about lymphedema risk reduction practices, where and how to obtain compression sleeves   Time 4   Period Weeks   Status New   CC Long Term Goal  #5   Title report pain decrease of at least 60%   Time 4   Period Weeks   Status New            Plan - 12/23/14 1222    Clinical Impression Statement Patient reported she felt her arms had swollen, especially on left, and measurements were up slightly.  She was treated with manual lymph drainage today and reported feeling  better at end of sessin.   Pt will benefit from skilled therapeutic intervention in order to improve on the following deficits Pain;Decreased range of motion;Impaired UE functional use;Increased edema   Rehab Potential Good   PT Frequency 2x / week   PT Duration 4 weeks   PT Treatment/Interventions Manual techniques;Manual lymph drainage;Patient/family education   PT Next Visit Plan Begin bilateral shoulder P/AA/AROM and HEP instruction; manual lymph drainage especially for bilateral chest areas, directing towards groins; instruct patient in self massage.        Problem List Patient Active Problem List   Diagnosis Date Noted  . Rash 09/24/2014  . Vaginal bleeding 09/24/2014  . Postherpetic neuralgia 09/03/2014  . Lymphedema 09/03/2014  . Suspected herpes zoster left C5 distribution 08/14/2014  . Neuropathy due to chemotherapeutic drug 03/03/2014  . Hand foot syndrome 03/03/2014  . Anxiety 03/03/2014  . Bilateral breast cancer 08/11/2013  . Palpitation   . Chest pain   . Atrial septal defect   . Laboratory test 11/25/2011   . Chronic bronchitis   . Tobacco abuse     Ovila Lepage 12/23/2014, 12:25 PM  [facility]  [place], [place], [address] Phone: [phone]   Fax:  [place], PT 12/23/2014 12:25 PM

## 2014-12-24 ENCOUNTER — Other Ambulatory Visit: Payer: Self-pay | Admitting: *Deleted

## 2014-12-24 ENCOUNTER — Telehealth: Payer: Self-pay | Admitting: *Deleted

## 2014-12-24 DIAGNOSIS — C50912 Malignant neoplasm of unspecified site of left female breast: Principal | ICD-10-CM

## 2014-12-24 DIAGNOSIS — C50911 Malignant neoplasm of unspecified site of right female breast: Secondary | ICD-10-CM

## 2014-12-24 NOTE — Telephone Encounter (Signed)
Spoke with Dr. Lindi Adie, ok to have port removed. Order placed for IR to remove (they placed port). Called patient but she did not answer phone and no voice mail is set up.

## 2014-12-24 NOTE — Telephone Encounter (Signed)
TC from patient asking about having her portacath removed.  She had some massage done to her affected arm with lymphedema (minimal edema) and therapist massaged up around her neck. Patient did not feel well afterwards-lightheaded and dizzy. Though better today. She forgot to tell the PT that she had a port. Now she is wondering if it is ok to have it taken out. Will she need any further treatment?  She also needs a flush appt.

## 2014-12-25 ENCOUNTER — Other Ambulatory Visit: Payer: Self-pay | Admitting: *Deleted

## 2014-12-25 ENCOUNTER — Telehealth: Payer: Self-pay | Admitting: *Deleted

## 2014-12-25 ENCOUNTER — Ambulatory Visit: Payer: Medicare Other | Admitting: Physical Therapy

## 2014-12-25 DIAGNOSIS — C50911 Malignant neoplasm of unspecified site of right female breast: Secondary | ICD-10-CM

## 2014-12-25 DIAGNOSIS — C50912 Malignant neoplasm of unspecified site of left female breast: Principal | ICD-10-CM

## 2014-12-25 MED ORDER — OXYCODONE-ACETAMINOPHEN 5-325 MG PO TABS: 1.0000 | ORAL_TABLET | Freq: Three times a day (TID) | ORAL | Status: DC | PRN

## 2014-12-25 NOTE — Telephone Encounter (Signed)
Called patient to pick up rx, also let her know that someone would be calling her to schedule appt for port removal.

## 2014-12-25 NOTE — Telephone Encounter (Signed)
Pt called stating she needs refill on her percocet. Last filled on 11/30/14 for 15 tablets. Pain is in her feet-neuropathy from chemo. Please call pt when prescription is ready for pick up.

## 2014-12-30 ENCOUNTER — Ambulatory Visit: Payer: Medicare Other | Admitting: Physical Therapy

## 2014-12-31 ENCOUNTER — Other Ambulatory Visit: Payer: Self-pay | Admitting: Physician Assistant

## 2015-01-01 ENCOUNTER — Ambulatory Visit: Payer: Medicare Other | Admitting: Physical Therapy

## 2015-01-01 ENCOUNTER — Ambulatory Visit (HOSPITAL_COMMUNITY)
Admission: RE | Admit: 2015-01-01 | Discharge: 2015-01-01 | Disposition: A | Discharge status: Home / Self Care | Payer: Medicare Other | Source: Ambulatory Visit | Attending: Hematology and Oncology | Admitting: Hematology and Oncology

## 2015-01-01 ENCOUNTER — Encounter (HOSPITAL_COMMUNITY): Payer: Self-pay

## 2015-01-01 DIAGNOSIS — Z452 Encounter for adjustment and management of vascular access device: Secondary | ICD-10-CM | POA: Insufficient documentation

## 2015-01-01 DIAGNOSIS — J449 Chronic obstructive pulmonary disease, unspecified: Secondary | ICD-10-CM | POA: Insufficient documentation

## 2015-01-01 DIAGNOSIS — F419 Anxiety disorder, unspecified: Secondary | ICD-10-CM | POA: Insufficient documentation

## 2015-01-01 DIAGNOSIS — Z87891 Personal history of nicotine dependence: Secondary | ICD-10-CM | POA: Diagnosis not present

## 2015-01-01 DIAGNOSIS — G8929 Other chronic pain: Secondary | ICD-10-CM | POA: Diagnosis not present

## 2015-01-01 DIAGNOSIS — C50912 Malignant neoplasm of unspecified site of left female breast: Secondary | ICD-10-CM

## 2015-01-01 DIAGNOSIS — C50911 Malignant neoplasm of unspecified site of right female breast: Secondary | ICD-10-CM

## 2015-01-01 DIAGNOSIS — Z853 Personal history of malignant neoplasm of breast: Secondary | ICD-10-CM | POA: Diagnosis not present

## 2015-01-01 LAB — CBC WITH DIFFERENTIAL/PLATELET
BASOS ABS: 0.1 10*3/uL (ref 0.0–0.1)
Basophils Relative: 1 % (ref 0–1)
Eosinophils Absolute: 0.4 10*3/uL (ref 0.0–0.7)
Eosinophils Relative: 5 % (ref 0–5)
HCT: 37.2 % (ref 36.0–46.0)
Hemoglobin: 11.4 g/dL — ABNORMAL LOW (ref 12.0–15.0)
LYMPHS ABS: 1.7 10*3/uL (ref 0.7–4.0)
LYMPHS PCT: 22 % (ref 12–46)
MCH: 22.6 pg — AB (ref 26.0–34.0)
MCHC: 30.6 g/dL (ref 30.0–36.0)
MCV: 73.7 fL — ABNORMAL LOW (ref 78.0–100.0)
Monocytes Absolute: 0.5 10*3/uL (ref 0.1–1.0)
Monocytes Relative: 7 % (ref 3–12)
NEUTROS PCT: 65 % (ref 43–77)
Neutro Abs: 5 10*3/uL (ref 1.7–7.7)
Platelets: 283 10*3/uL (ref 150–400)
RBC: 5.05 MIL/uL (ref 3.87–5.11)
RDW: 14.1 % (ref 11.5–15.5)
WBC: 7.7 10*3/uL (ref 4.0–10.5)

## 2015-01-01 LAB — PROTIME-INR
INR: 0.99 (ref 0.00–1.49)
Prothrombin Time: 13.3 seconds (ref 11.6–15.2)

## 2015-01-01 LAB — APTT: aPTT: 31 seconds (ref 24–37)

## 2015-01-01 MED ORDER — SODIUM CHLORIDE 0.9 % IV SOLN
INTRAVENOUS | Status: DC
  Administered 2015-01-01: 08:00:00 via INTRAVENOUS

## 2015-01-01 MED ORDER — FENTANYL CITRATE (PF) 100 MCG/2ML IJ SOLN
INTRAMUSCULAR | Status: AC
  Filled 2015-01-01: qty 4

## 2015-01-01 MED ORDER — CEFAZOLIN SODIUM-DEXTROSE 2-3 GM-% IV SOLR
2.0000 g | INTRAVENOUS | Status: AC
  Administered 2015-01-01: 2 g via INTRAVENOUS

## 2015-01-01 MED ORDER — MIDAZOLAM HCL 2 MG/2ML IJ SOLN
INTRAMUSCULAR | Status: AC | PRN
  Administered 2015-01-01 (×3): 0.5 mg via INTRAVENOUS
  Administered 2015-01-01: 1 mg via INTRAVENOUS
  Administered 2015-01-01: 0.5 mg via INTRAVENOUS

## 2015-01-01 MED ORDER — LIDOCAINE HCL 1 % IJ SOLN
INTRAMUSCULAR | Status: AC
  Filled 2015-01-01: qty 20

## 2015-01-01 MED ORDER — MIDAZOLAM HCL 2 MG/2ML IJ SOLN
INTRAMUSCULAR | Status: AC
  Filled 2015-01-01: qty 6

## 2015-01-01 MED ORDER — CEFAZOLIN SODIUM-DEXTROSE 2-3 GM-% IV SOLR
INTRAVENOUS | Status: AC
  Filled 2015-01-01: qty 50

## 2015-01-01 MED ORDER — FENTANYL CITRATE (PF) 100 MCG/2ML IJ SOLN
INTRAMUSCULAR | Status: AC | PRN
  Administered 2015-01-01 (×2): 25 ug via INTRAVENOUS

## 2015-01-01 NOTE — Procedures (Signed)
Interventional Radiology Procedure Note  Procedure:  Port removal   Complications:  None  Estimated Blood Loss:  <10 mL  Right chest port removed in entirety.    Recommendations:  Venetia Night. Kathlene Cote, M.D Pager:  [phone]

## 2015-01-01 NOTE — Discharge Instructions (Signed)
Incision Care An incision is when a surgeon cuts into your body tissues. After surgery, the incision needs to be cared for properly to prevent infection.  HOME CARE INSTRUCTIONS   Take all medicine as directed by your caregiver. Only take over-the-counter or prescription medicines for pain, discomfort, or fever as directed by your caregiver.  Do not remove your bandage (dressing) or get your incision wet until your surgeon gives you permission. In the event that your dressing becomes wet, dirty, or starts to smell, change the dressing and call your surgeon for instructions as soon as possible.  Take showers. Do not take tub baths, swim, or do anything that may soak the wound until it is healed.  Resume your normal diet and activities as directed or allowed.  Avoid lifting any weight until you are instructed otherwise.  Use anti-itch antihistamine medicine as directed by your caregiver. The wound may itch when it is healing. Do not pick or scratch at the wound.  Follow up with your caregiver for stitch (suture) or staple removal as directed.  Drink enough fluids to keep your urine clear or pale yellow. SEEK MEDICAL CARE IF:   You have redness, swelling, or increasing pain in the wound that is not controlled with medicine.  You have drainage, blood, or pus coming from the wound that lasts longer than 1 day.  You develop muscle aches, chills, or a general ill feeling.  You notice a bad smell coming from the wound or dressing.  Your wound edges separate after the sutures, staples, or skin adhesive strips have been removed.  You develop persistent nausea or vomiting. SEEK IMMEDIATE MEDICAL CARE IF:   You have a fever.  You develop a rash.  You develop dizzy episodes or faint while standing.  You have difficulty breathing.  You develop any reaction or side effects to medicine given. MAKE SURE YOU:   Understand these instructions.  Will watch your condition.  Will get help  right away if you are not doing well or get worse. Document Released: 02/03/2005 Document Revised: 10/09/2011 Document Reviewed: 09/10/2013 [facility] Patient Information 2015 [place], [place]. This information is not intended to replace advice given to you by your health care provider. Make sure you discuss any questions you have with your health care provider. Conscious Sedation, Adult, Care After Refer to this sheet in the next few weeks. These instructions provide you with information on caring for yourself after your procedure. Your health care provider may also give you more specific instructions. Your treatment has been planned according to current medical practices, but problems sometimes occur. Call your health care provider if you have any problems or questions after your procedure. WHAT TO EXPECT AFTER THE PROCEDURE  After your procedure:  You may feel sleepy, clumsy, and have poor balance for several hours.  Vomiting may occur if you eat too soon after the procedure. HOME CARE INSTRUCTIONS  Do not participate in any activities where you could become injured for at least 24 hours. Do not:  Drive.  Swim.  Ride a bicycle.  Operate heavy machinery.  Cook.  Use power tools.  Climb ladders.  Work from a high place.  Do not make important decisions or sign legal documents until you are improved.  If you vomit, drink water, juice, or soup when you can drink without vomiting. Make sure you have little or no nausea before eating solid foods.  Only take over-the-counter or prescription medicines for pain, discomfort, or fever as directed by  your health care provider.  Make sure you and your family fully understand everything about the medicines given to you, including what side effects may occur.  You should not drink alcohol, take sleeping pills, or take medicines that cause drowsiness for at least 24 hours.  If you smoke, do not smoke without supervision.  If you are  feeling better, you may resume normal activities 24 hours after you were sedated.  Keep all appointments with your health care provider. SEEK MEDICAL CARE IF:  Your skin is pale or bluish in color.  You continue to feel nauseous or vomit.  Your pain is getting worse and is not helped by medicine.  You have bleeding or swelling.  You are still sleepy or feeling clumsy after 24 hours. SEEK IMMEDIATE MEDICAL CARE IF:  You develop a rash.  You have difficulty breathing.  You develop any type of allergic problem.  You have a fever. MAKE SURE YOU:  Understand these instructions.  Will watch your condition.  Will get help right away if you are not doing well or get worse. Document Released: 05/07/2013 Document Reviewed: 05/07/2013 [facility] Patient Information 2015 [place], [place]. This information is not intended to replace advice given to you by your health care provider. Make sure you discuss any questions you have with your health care provider.

## 2015-01-01 NOTE — H&P (Signed)
Chief Complaint: "I'm getting my port out"  Referring Physician(s): Gudena,Vinay  History of Present Illness: Paula Navarro is a 64 y.o. female with prior history of bilateral breast cancer and previous lumpectomies, node dissection and chemoradiation. She has completed chemotherapy and now presents for port a cath removal.  Past Medical History  Diagnosis Date  . Chronic bronchitis   . Palpitation     Tachycardia reported by monitor clerk during a symptomatic spell  . Chest pain     Admitted to [facility] in 09/2011; refused stress test  . Atrial septal defect 1996    Surgical repair in 1996  . Tobacco abuse     60 pack years; 1.5 packs per day  . Anxiety   . Anemia   . Breast cancer   . Wears dentures     top  . COPD (chronic obstructive pulmonary disease)     on xray  . Chronic pain   . Radiation 06/30/14-08/17/14    Bilateral Breast    Past Surgical History  Procedure Laterality Date  . Cholecystectomy    . Cesarean section      x3  . Breast biopsy Bilateral   . Tubal ligation    . Asd repair, ostium primum  1996    dr Roxy Horseman  . Port a cath revision  1/15    put in   . Breast lumpectomy with radioactive seed localization Bilateral 04/08/2014    Procedure: BILATERAL  RADIOACTIVE SEED LOCALIZATION LUMPECTOMY ;  Surgeon: Autumn Messing III, MD;  Location: [place];  Service: General;  Laterality: Bilateral;  . Axillary lymph node dissection Bilateral 04/08/2014    Procedure:  BILATERAL AXILLARY LYMPH NODE DISSECTION;  Surgeon: Autumn Messing III, MD;  Location: [place];  Service: General;  Laterality: Bilateral;  . Open heart surgery      Allergies: Codeine and Morphine and related  Medications: Prior to Admission medications   Medication Sig Start Date End Date Taking? Authorizing Provider  anastrozole (ARIMIDEX) 1 MG tablet Take 1 tablet (1 mg total) by mouth daily. 08/04/14  Yes Nicholas Lose, MD  B Complex-C (SUPER B COMPLEX PO) Take 1  tablet by mouth daily.   Yes Historical Provider, MD  clobetasol cream (TEMOVATE) 7.16 % Apply 1 application topically 2 (two) times daily. Apply to hands and feet twice a day as needed. Patient taking differently: Apply 1 application topically 2 (two) times daily as needed (skin irritation). Apply to hands and feet 03/30/14  Yes Nicholas Lose, MD  gabapentin (NEURONTIN) 300 MG capsule TAKE 2 CAPSULES (600 MG TOTAL) BY MOUTH 3 (THREE) TIMES DAILY. 12/09/14  Yes Nicholas Lose, MD  ibuprofen (ADVIL,MOTRIN) 200 MG tablet Take 200 mg by mouth every 6 (six) hours as needed for moderate pain.    Yes Historical Provider, MD  loratadine (CLARITIN) 10 MG tablet Take 10 mg by mouth daily as needed for itching.   Yes Historical Provider, MD  LORazepam (ATIVAN) 1 MG tablet TAKE 1 TABLET BY MOUTH AT BEDTIME Patient taking differently: TAKE 1 MG  BY MOUTH AT BEDTIME 12/14/14  Yes Nicholas Lose, MD  oxyCODONE-acetaminophen (PERCOCET/ROXICET) 5-325 MG per tablet Take 1 tablet by mouth every 8 (eight) hours as needed. Patient taking differently: Take 0.5 tablets by mouth every 8 (eight) hours as needed for moderate pain or severe pain.  12/25/14  Yes Nicholas Lose, MD  lidocaine-prilocaine (EMLA) cream Apply 1 application topically as needed. Patient not taking: Reported on 01/01/2015  01/20/14   Maryanna Shape, NP  ondansetron (ZOFRAN) 4 MG tablet Take 1 tablet (4 mg total) by mouth every 6 (six) hours. Patient not taking: Reported on 01/01/2015 12/03/14   Virgel Manifold, MD    Family History  Problem Relation Age of Onset  . Breast cancer Maternal Aunt     History   Social History  . Marital Status: Widowed    Spouse Name: N/A  . Number of Children: N/A  . Years of Education: N/A   Social History Main Topics  . Smoking status: Former Smoker -- 1.50 packs/day for 40 years    Types: Cigarettes    Quit date: 08/01/2013  . Smokeless tobacco: Never Used  . Alcohol Use: No  . Drug Use: No  . Sexual Activity: Not  Currently   Other Topics Concern  . None   Social History Narrative      Review of Systems  Constitutional: Negative for fever and chills.  Respiratory: Negative for cough and shortness of breath.   Cardiovascular:       Occasional chest discomfort  Gastrointestinal: Negative for nausea, vomiting, abdominal pain and blood in stool.  Genitourinary: Negative for dysuria and hematuria.  Musculoskeletal: Negative for back pain.  Neurological: Negative for headaches.  Hematological: Does not bruise/bleed easily.    Vital Signs: BP 119/71 mmHg  Pulse 77  Temp(Src) 98.1 F (36.7 C) (Oral)  Resp 16  SpO2 99%  LMP 05/20/2011  Physical Exam  Constitutional: She is oriented to person, place, and time. She appears well-developed and well-nourished.  Cardiovascular: Normal rate and regular rhythm.   Clean, intact right chest wall Port-A-Cath  Pulmonary/Chest: Effort normal and breath sounds normal.  Abdominal: Soft. Bowel sounds are normal.  Musculoskeletal: Normal range of motion. She exhibits no edema.  Neurological: She is alert and oriented to person, place, and time.    Imaging: No results found.  Labs:  CBC:  Recent Labs  02/24/14 0904 03/27/14 1348 11/12/14 0905 01/01/15 0805  WBC 8.1 7.9 7.3 7.7  HGB 9.0* 11.6 12.3 11.4*  HCT 29.3* 37.1 38.2 37.2  PLT 490* 268 280 283    COAGS:  Recent Labs  01/01/15 0805  INR 0.99  APTT 31    BMP:  Recent Labs  02/10/14 0841 02/24/14 0904 03/27/14 1348 11/12/14 0905  NA 137 142 140 141  K 4.3 4.3 4.2 4.5  CO2 '27 26 25 24  '$ GLUCOSE 120 156* 91 103  BUN 5.1* 5.1* 9.2 13.1  CALCIUM 9.5 9.3 9.8 9.5  CREATININE 0.7 0.8 0.7 0.8    LIVER FUNCTION TESTS:  Recent Labs  02/10/14 0841 02/24/14 0904 03/27/14 1348 11/12/14 0905  BILITOT 0.77 0.32 0.85 0.49  AST 23 30 32 29  ALT '26 29 31 25  '$ ALKPHOS 56 59 55 72  PROT 6.4 6.3* 7.0 7.3  ALBUMIN 3.2* 3.0* 3.9 3.8    TUMOR MARKERS: No results for  input(s): AFPTM, CEA, CA199, CHROMGRNA in the last 8760 hours.  Assessment and Plan: Paula Navarro is a 64 y.o. female with prior history of bilateral breast cancer and previous lumpectomies, node dissection and chemoradiation. She has completed chemotherapy and now presents for port a cath removal. Details/risks of procedure, including but not limited to, internal bleeding, infection, discussed with patient with her understanding and consent.     Signed: D. Rowe Robert 01/01/2015, 8:55 AM   I spent a total of 20 minutes face to face in clinical consultation, greater than 50%  of which was counseling/coordinating care for Port-A-Cath removal

## 2015-01-04 ENCOUNTER — Ambulatory Visit: Payer: Medicare Other | Attending: General Surgery

## 2015-01-04 ENCOUNTER — Telehealth (HOSPITAL_COMMUNITY): Payer: Self-pay | Admitting: Radiology

## 2015-01-04 NOTE — Telephone Encounter (Signed)
Patient called and left voicemail for IR to return her call at 1319.  There was no answer and voicemail was not set up on the phone.

## 2015-01-06 ENCOUNTER — Ambulatory Visit: Payer: Medicare Other | Admitting: Physical Therapy

## 2015-01-07 ENCOUNTER — Encounter (HOSPITAL_COMMUNITY): Payer: Self-pay

## 2015-01-07 ENCOUNTER — Emergency Department (HOSPITAL_COMMUNITY): Payer: Medicare Other

## 2015-01-07 ENCOUNTER — Emergency Department (HOSPITAL_COMMUNITY)
Admission: EM | Admit: 2015-01-07 | Discharge: 2015-01-07 | Disposition: A | Discharge status: Home / Self Care | Payer: Medicare Other | Attending: Emergency Medicine | Admitting: Emergency Medicine

## 2015-01-07 DIAGNOSIS — Z8709 Personal history of other diseases of the respiratory system: Secondary | ICD-10-CM | POA: Insufficient documentation

## 2015-01-07 DIAGNOSIS — F419 Anxiety disorder, unspecified: Secondary | ICD-10-CM | POA: Insufficient documentation

## 2015-01-07 DIAGNOSIS — G8929 Other chronic pain: Secondary | ICD-10-CM | POA: Insufficient documentation

## 2015-01-07 DIAGNOSIS — Z8774 Personal history of (corrected) congenital malformations of heart and circulatory system: Secondary | ICD-10-CM | POA: Diagnosis not present

## 2015-01-07 DIAGNOSIS — Z87891 Personal history of nicotine dependence: Secondary | ICD-10-CM | POA: Insufficient documentation

## 2015-01-07 DIAGNOSIS — Z853 Personal history of malignant neoplasm of breast: Secondary | ICD-10-CM | POA: Diagnosis not present

## 2015-01-07 DIAGNOSIS — J449 Chronic obstructive pulmonary disease, unspecified: Secondary | ICD-10-CM | POA: Insufficient documentation

## 2015-01-07 DIAGNOSIS — M25562 Pain in left knee: Secondary | ICD-10-CM | POA: Diagnosis not present

## 2015-01-07 DIAGNOSIS — Z79899 Other long term (current) drug therapy: Secondary | ICD-10-CM | POA: Insufficient documentation

## 2015-01-07 DIAGNOSIS — Z862 Personal history of diseases of the blood and blood-forming organs and certain disorders involving the immune mechanism: Secondary | ICD-10-CM | POA: Diagnosis not present

## 2015-01-07 LAB — CBC WITH DIFFERENTIAL/PLATELET
BASOS ABS: 0 10*3/uL (ref 0.0–0.1)
Basophils Relative: 0 % (ref 0–1)
Eosinophils Absolute: 0.1 10*3/uL (ref 0.0–0.7)
Eosinophils Relative: 0 % (ref 0–5)
HEMATOCRIT: 36.8 % (ref 36.0–46.0)
Hemoglobin: 11.8 g/dL — ABNORMAL LOW (ref 12.0–15.0)
Lymphocytes Relative: 11 % — ABNORMAL LOW (ref 12–46)
Lymphs Abs: 1.9 10*3/uL (ref 0.7–4.0)
MCH: 23.4 pg — AB (ref 26.0–34.0)
MCHC: 32.1 g/dL (ref 30.0–36.0)
MCV: 73 fL — ABNORMAL LOW (ref 78.0–100.0)
MONOS PCT: 6 % (ref 3–12)
Monocytes Absolute: 1.1 10*3/uL — ABNORMAL HIGH (ref 0.1–1.0)
NEUTROS ABS: 14.2 10*3/uL — AB (ref 1.7–7.7)
Neutrophils Relative %: 82 % — ABNORMAL HIGH (ref 43–77)
PLATELETS: 300 10*3/uL (ref 150–400)
RBC: 5.04 MIL/uL (ref 3.87–5.11)
RDW: 14.1 % (ref 11.5–15.5)
WBC: 17.3 10*3/uL — AB (ref 4.0–10.5)

## 2015-01-07 LAB — SYNOVIAL CELL COUNT + DIFF, W/ CRYSTALS
CRYSTALS FLUID: NONE SEEN
EOSINOPHILS-SYNOVIAL: 0 % (ref 0–1)
LYMPHOCYTES-SYNOVIAL FLD: 7 % (ref 0–20)
Monocyte-Macrophage-Synovial Fluid: 4 % — ABNORMAL LOW (ref 50–90)
NEUTROPHIL, SYNOVIAL: 89 % — AB (ref 0–25)
OTHER CELLS-SYN: 0
WBC, Synovial: 25147 /mm3 — ABNORMAL HIGH (ref 0–200)

## 2015-01-07 LAB — BASIC METABOLIC PANEL
ANION GAP: 12 (ref 5–15)
BUN: 10 mg/dL (ref 6–20)
CHLORIDE: 95 mmol/L — AB (ref 101–111)
CO2: 26 mmol/L (ref 22–32)
CREATININE: 0.79 mg/dL (ref 0.44–1.00)
Calcium: 9.3 mg/dL (ref 8.9–10.3)
GFR calc Af Amer: >60 mL/min (ref >60)
GFR calc non Af Amer: >60 mL/min (ref >60)
GLUCOSE: 117 mg/dL — AB (ref 65–99)
POTASSIUM: 4.3 mmol/L (ref 3.5–5.1)
Sodium: 133 mmol/L — ABNORMAL LOW (ref 135–145)

## 2015-01-07 MED ORDER — ACETAMINOPHEN 325 MG PO TABS
650.0000 mg | ORAL_TABLET | Freq: Once | ORAL | Status: DC
  Filled 2015-01-07: qty 2

## 2015-01-07 MED ORDER — FENTANYL CITRATE 100 MCG BU TABS: 100.0000 ug | ORAL_TABLET | Freq: Once | BUCCAL | Status: DC

## 2015-01-07 MED ORDER — OXYCODONE-ACETAMINOPHEN 5-325 MG PO TABS: 1.0000 | ORAL_TABLET | ORAL | Status: DC | PRN

## 2015-01-07 MED ORDER — LIDOCAINE-EPINEPHRINE (PF) 2 %-1:200000 IJ SOLN
20.0000 mL | Freq: Once | INTRAMUSCULAR | Status: AC
  Administered 2015-01-07: 20 mL
  Filled 2015-01-07: qty 20

## 2015-01-07 MED ORDER — MORPHINE SULFATE 4 MG/ML IJ SOLN
4.0000 mg | Freq: Once | INTRAMUSCULAR | Status: DC
  Filled 2015-01-07: qty 1

## 2015-01-07 MED ORDER — FENTANYL CITRATE (PF) 100 MCG/2ML IJ SOLN
100.0000 ug | Freq: Once | INTRAMUSCULAR | Status: AC
Start: 2015-01-07 — End: 2015-01-07
  Administered 2015-01-07: 100 ug via INTRAVENOUS
  Filled 2015-01-07: qty 2

## 2015-01-07 MED ORDER — HYDROMORPHONE HCL 1 MG/ML IJ SOLN
1.0000 mg | Freq: Once | INTRAMUSCULAR | Status: AC
Start: 2015-01-07 — End: 2015-01-07
  Administered 2015-01-07: 1 mg via INTRAMUSCULAR
  Filled 2015-01-07: qty 1

## 2015-01-07 MED ORDER — SULFAMETHOXAZOLE-TRIMETHOPRIM 800-160 MG PO TABS
1.0000 | ORAL_TABLET | Freq: Two times a day (BID) | ORAL | Status: AC
Start: 2015-01-07 — End: 2015-01-14

## 2015-01-07 MED ORDER — OXYCODONE-ACETAMINOPHEN 5-325 MG PO TABS
1.0000 | ORAL_TABLET | Freq: Once | ORAL | Status: AC
  Administered 2015-01-07: 0.5 via ORAL
  Filled 2015-01-07: qty 1

## 2015-01-07 MED ORDER — SULFAMETHOXAZOLE-TRIMETHOPRIM 800-160 MG PO TABS
1.0000 | ORAL_TABLET | Freq: Once | ORAL | Status: AC
  Administered 2015-01-07: 1 via ORAL
  Filled 2015-01-07: qty 1

## 2015-01-07 MED ORDER — AMOXICILLIN-POT CLAVULANATE 875-125 MG PO TABS: 1.0000 | ORAL_TABLET | Freq: Two times a day (BID) | ORAL | Status: DC

## 2015-01-07 MED ORDER — SODIUM CHLORIDE 0.9 % IV SOLN
3.0000 g | Freq: Once | INTRAVENOUS | Status: AC
  Administered 2015-01-07: 3 g via INTRAVENOUS
  Filled 2015-01-07: qty 3

## 2015-01-07 NOTE — ED Notes (Signed)
Assumed care of patient from [place], [place]. Pt c/o pain to left knee. Notified EDP. Call bell in reach. IV Unasyn almost complete. No acute distress.

## 2015-01-07 NOTE — ED Provider Notes (Signed)
CSN: [id number]     Arrival date & time 01/07/15  1257 History   First MD Initiated Contact with Patient 01/07/15 1318     Chief Complaint  Patient presents with  . Knee Pain      HPI Patient presents emergency department with pain and swelling to the left knee which started last night.  She has a history of cancer and recently had her right Port-A-Cath removed from her right chest.  This was removed 6 days ago.  She reports chills without documented fever.  She's never had pain in her left knee lacked this before.  She denies injury or trauma.  She does report mopping the floor several times over the past 5 days.  She thinks she could've injured her left knee just from overuse.  No history of arthritis.  She's tried anti-inflammatories without improvement.  She is not on anticoagulants.  Past Medical History  Diagnosis Date  . Chronic bronchitis   . Palpitation     Tachycardia reported by monitor clerk during a symptomatic spell  . Chest pain     Admitted to [facility] in 09/2011; refused stress test  . Atrial septal defect 1996    Surgical repair in 1996  . Tobacco abuse     60 pack years; 1.5 packs per day  . Anxiety   . Anemia   . Breast cancer   . Wears dentures     top  . COPD (chronic obstructive pulmonary disease)     on xray  . Chronic pain   . Radiation 06/30/14-08/17/14    Bilateral Breast   Past Surgical History  Procedure Laterality Date  . Cholecystectomy    . Cesarean section      x3  . Breast biopsy Bilateral   . Tubal ligation    . Asd repair, ostium primum  1996    dr Roxy Horseman  . Port a cath revision  1/15    put in   . Breast lumpectomy with radioactive seed localization Bilateral 04/08/2014    Procedure: BILATERAL  RADIOACTIVE SEED LOCALIZATION LUMPECTOMY ;  Surgeon: Autumn Messing III, MD;  Location: [place];  Service: General;  Laterality: Bilateral;  . Axillary lymph node dissection Bilateral 04/08/2014    Procedure:  BILATERAL AXILLARY LYMPH NODE  DISSECTION;  Surgeon: Autumn Messing III, MD;  Location: [place];  Service: General;  Laterality: Bilateral;  . Open heart surgery     Family History  Problem Relation Age of Onset  . Breast cancer Maternal Aunt    History  Substance Use Topics  . Smoking status: Former Smoker -- 1.50 packs/day for 40 years    Types: Cigarettes    Quit date: 08/01/2013  . Smokeless tobacco: Never Used  . Alcohol Use: No   OB History    Gravida Para Term Preterm AB TAB SAB Ectopic Multiple Living   '4 3 3            '$ Review of Systems  All other systems reviewed and are negative.     Allergies  Codeine and Morphine and related  Home Medications   Prior to Admission medications   Medication Sig Start Date End Date Taking? Authorizing Provider  anastrozole (ARIMIDEX) 1 MG tablet Take 1 tablet (1 mg total) by mouth daily. 08/04/14  Yes Nicholas Lose, MD  B Complex-C (SUPER B COMPLEX PO) Take 1 tablet by mouth daily.   Yes Historical Provider, MD  clobetasol cream (TEMOVATE) 0.05 % Apply  1 application topically 2 (two) times daily. Apply to hands and feet twice a day as needed. Patient taking differently: Apply 1 application topically 2 (two) times daily as needed (skin irritation). Apply to hands and feet 03/30/14  Yes Nicholas Lose, MD  gabapentin (NEURONTIN) 300 MG capsule TAKE 2 CAPSULES (600 MG TOTAL) BY MOUTH 3 (THREE) TIMES DAILY. 12/09/14  Yes Nicholas Lose, MD  ibuprofen (ADVIL,MOTRIN) 200 MG tablet Take 200 mg by mouth every 6 (six) hours as needed for moderate pain.    Yes Historical Provider, MD  loratadine (CLARITIN) 10 MG tablet Take 10 mg by mouth daily as needed for itching.   Yes Historical Provider, MD  LORazepam (ATIVAN) 1 MG tablet TAKE 1 TABLET BY MOUTH AT BEDTIME Patient taking differently: TAKE 1 MG  BY MOUTH AT BEDTIME 12/14/14  Yes Nicholas Lose, MD  oxyCODONE-acetaminophen (PERCOCET/ROXICET) 5-325 MG per tablet Take 1 tablet by mouth every 8 (eight) hours as  needed. Patient taking differently: Take 0.5 tablets by mouth every 8 (eight) hours as needed for moderate pain or severe pain.  12/25/14  Yes Nicholas Lose, MD  ondansetron (ZOFRAN) 4 MG tablet Take 1 tablet (4 mg total) by mouth every 6 (six) hours. Patient not taking: Reported on 01/01/2015 12/03/14   Virgel Manifold, MD   BP 136/87 mmHg  Pulse 111  Temp(Src) 100.4 F (38 C) (Oral)  Resp 18  Ht '5\' 4"'$  (1.626 m)  Wt 127 lb (57.607 kg)  BMI 21.79 kg/m2  SpO2 94%  LMP 05/20/2011 Physical Exam  Constitutional: She is oriented to person, place, and time. She appears well-developed and well-nourished. No distress.  HENT:  Head: Normocephalic and atraumatic.  Eyes: EOM are normal.  Neck: Normal range of motion.  Cardiovascular: Normal rate, regular rhythm and normal heart sounds.   Pulmonary/Chest: Effort normal and breath sounds normal.  Abdominal: Soft. She exhibits no distension. There is no tenderness.  Musculoskeletal: Normal range of motion.  Pain with range of motion of left knee.  Small joint effusion of left knee.  Left knee is warm to the touch.  Normal pulses in left foot.  Neurological: She is alert and oriented to person, place, and time.  Skin: Skin is warm and dry.  Psychiatric: She has a normal mood and affect. Judgment normal.  Nursing note and vitals reviewed.   ED Course  ARTHOCENTESIS Performed by: Jola Schmidt Authorized by: Jola Schmidt Consent: Verbal consent obtained. Risks and benefits: risks, benefits and alternatives were discussed Consent given by: patient Required items: required blood products, implants, devices, and special equipment available Patient identity confirmed: verbally with patient Time out: Immediately prior to procedure a "time out" was called to verify the correct patient, procedure, equipment, support staff and site/side marked as required. Indications: joint swelling,  pain and possible septic joint  Body area: knee Local anesthesia  used: yes Anesthesia: local infiltration Local anesthetic: lidocaine 2% with epinephrine Anesthetic total: 3 ml Preparation: Patient was prepped and draped in the usual sterile fashion. Needle gauge: 20 G Ultrasound guidance: no Approach: lateral Aspirate: yellow Aspirate amount: 10 mL Patient tolerance: Patient tolerated the procedure well with no immediate complications   (including critical care time) Labs Review Labs Reviewed  CBC WITH DIFFERENTIAL/PLATELET - Abnormal; Notable for the following:    WBC 17.3 (*)    Hemoglobin 11.8 (*)    MCV 73.0 (*)    MCH 23.4 (*)    Neutrophils Relative % 82 (*)    Neutro Abs 14.2 (*)  Lymphocytes Relative 11 (*)    Monocytes Absolute 1.1 (*)    All other components within normal limits  BASIC METABOLIC PANEL - Abnormal; Notable for the following:    Sodium 133 (*)    Chloride 95 (*)    Glucose, Bld 117 (*)    All other components within normal limits  SYNOVIAL CELL COUNT + DIFF, W/ CRYSTALS - Abnormal; Notable for the following:    Appearance-Synovial TURBID (*)    WBC, Synovial 25147 (*)    Neutrophil, Synovial 89 (*)    Monocyte-Macrophage-Synovial Fluid 4 (*)    All other components within normal limits  BODY FLUID CULTURE    Imaging Review Dg Knee Complete 4 Views Left  01/07/2015   CLINICAL DATA:  Left knee pain beginning yesterday. The knee is hot to touch.  EXAM: LEFT KNEE - COMPLETE 4+ VIEW  COMPARISON:  None.  FINDINGS: A small joint effusion is present. The knee is located. No acute fracture scratch the no acute osseous abnormality is present. There are no erosions. Mild degenerative changes are evident.  IMPRESSION: 1. No acute osseous abnormality. 2. Small joint effusion. Internal derangement or infection is not excluded.   Electronically Signed   By: San Morelle M.D.   On: 01/07/2015 14:39  I personally reviewed the imaging tests through PACS system I reviewed available ER/hospitalization records through the  EMR    EKG Interpretation None      MDM   Final diagnoses:  Left knee pain    5:52 PM Spoke with Dr Luna Glasgow, orthopedics, who requests to see the pt first thing tomorrow morning in the office. Requests IV unasyn now and septra. Requests discharge with septra and augmentin.    Jola Schmidt, MD 01/07/15 2100

## 2015-01-07 NOTE — ED Notes (Signed)
Pt is complaining of pain and swelling to left knee that started last night. Knee is hot to touch

## 2015-01-07 NOTE — ED Notes (Signed)
Urine cleaned from floor. Pt stated that she didn't want to ask anyone for help and spilt contents of bed pain. Pt then stated she was hurting. Received order for percocet. Pt stated that she could only take half the pill. Other half was wasted in Pyxis with verifying RN. NAD at this time. Awaiting dispo.

## 2015-01-07 NOTE — ED Notes (Signed)
Pt states she had her port removed Friday because she was finished with chemo

## 2015-01-11 ENCOUNTER — Ambulatory Visit: Payer: Medicare Other | Admitting: Physical Therapy

## 2015-01-11 ENCOUNTER — Telehealth: Payer: Self-pay | Admitting: *Deleted

## 2015-01-11 LAB — BODY FLUID CULTURE: Culture: NO GROWTH

## 2015-01-11 NOTE — Telephone Encounter (Signed)
TC from patient who states she was in the ED @ [facility] last Thursday for a painful swollen knee.  She had some fluid removed from her knee and is on antibiotics. She has an appointment on Wednesday, 01/13/15 with orthopedics. She states she might need some kind of surgery. She wanted to know if it was ok with Dr. Lindi Adie. Informed her that Dr. Lindi Adie was out of town, but also encouraged her to keep appointment with orthopedist and find out exactly what they need to do.  Reminded pt that she is not receiving current chemo other than Anastrozole. Pt. Voiced understanding and will see orthopedist as scheduled. Encouraged her to keep [place] up to date on her status.

## 2015-01-13 NOTE — Telephone Encounter (Signed)
This RN informed pt per care by this office she may proceed with appropriate care with orthopedist for new knee issues.  Pt verbalized understanding.

## 2015-01-22 ENCOUNTER — Telehealth: Payer: Self-pay

## 2015-01-22 NOTE — Telephone Encounter (Signed)
Returning pt call from 354pm. Pt stated in message she was hurting under her arms and had some personal issues. When tried to call back there was no answer. Her mailbox is not set up to take messages.

## 2015-01-23 ENCOUNTER — Other Ambulatory Visit: Payer: Self-pay | Admitting: Hematology and Oncology

## 2015-01-25 ENCOUNTER — Telehealth: Payer: Self-pay | Admitting: *Deleted

## 2015-01-25 NOTE — Telephone Encounter (Signed)
Chart reviewed.

## 2015-01-25 NOTE — Telephone Encounter (Signed)
Pt called and stated she was to have referral to a gynecologist but had not heard from anyone.  Nurse noted referral was made on 11/25/14.  [facility] had tried to contact pt on 12/10/14 but was unable due to pt not have voice mail set up. Called pt back and informed pt of above info.  Gave pt phone number to the practice for pt to call for an appt.

## 2015-02-15 ENCOUNTER — Telehealth: Payer: Self-pay

## 2015-02-15 NOTE — Telephone Encounter (Signed)
Pt left message about pain in her hands and feet. The gabapentin may not be working. Called pt back. She stated she had a really bad weekend. The hands and feet have an outward pressure and feel like they are going to explode. Numbness and hurting and aching, it feels like there are rubber balls under her feet. Sunday morning her pinky toe was bleeding, she does not remember hitting or injuring toe, she did put peroxide on it and left it open to air. She states it looks better. She walks around barefoot or in bedroom slippers. Dr Marlou Starks started her on tramadol. Pt is asking is there any changes or new medication to help her?

## 2015-02-15 NOTE — Telephone Encounter (Signed)
Shes on Neurontin for neuropathy. Have her take tramadol

## 2015-02-15 NOTE — Telephone Encounter (Signed)
Let pt know we were trying, for the third time, to get a pain clinic referral.  Let pt know if not completed by the end of this week, that I would contact the pain clinic personally on Monday.  Pt voiced understanding.    Call reports from [place] dtd 02/09/15 and 02/14/15 sent to scan.

## 2015-02-16 ENCOUNTER — Telehealth: Payer: Self-pay | Admitting: Hematology and Oncology

## 2015-02-16 NOTE — Telephone Encounter (Signed)
Unable to leave message, mailbox not set up.

## 2015-02-16 NOTE — Telephone Encounter (Signed)
S/w pt Dr Geralyn Flash message below. She said she will take the tramadol. She had been taking less than Dr Marlou Starks prescribed. Cautioned her about driving while taking tramadol.

## 2015-02-16 NOTE — Telephone Encounter (Signed)
pt called to see who called her from the cancer center....no documentation

## 2015-02-18 ENCOUNTER — Telehealth: Payer: Self-pay | Admitting: Hematology and Oncology

## 2015-02-18 NOTE — Telephone Encounter (Signed)
Previous pain medicine referrals denied. Contacted [facility] and spoke with Inez Catalina. Per Inez Catalina once referral and required information faxed after review if patient is a candidate they will scheduled and contact both patient and office. If patient cannot be scheduled they will contact our office. Referred to [facility] per [name].

## 2015-02-18 NOTE — Telephone Encounter (Signed)
Faxed medical records to [facility] [phone]

## 2015-02-18 NOTE — Telephone Encounter (Signed)
Add to previous note.................... Copy of referral with fax cover sheet taken to HIM to fax referral along with notes - ins - imaging studies to [facility] at [phone].

## 2015-02-22 ENCOUNTER — Telehealth: Payer: Self-pay | Admitting: Hematology and Oncology

## 2015-02-22 NOTE — Telephone Encounter (Signed)
Received a call from [facility] at [facility] [phone]). Per Inez Catalina they received a referral for patient back in April and accepted it but had not been able to reach patient. Inez Catalina made aware that I would call Ms. Fraley and have her contact them for an appointment. Spoke with Ms. Stodghill directly today re referral, informing her of the above and provided the name and number for [facility] and asked that she call them back to get an appointment. Per Ms. Kuna she will call them.

## 2015-02-23 ENCOUNTER — Telehealth: Payer: Self-pay

## 2015-02-23 NOTE — Telephone Encounter (Signed)
Fax received from [facility] dtd 02/23/15.  Pt referral appt for 8/1.  MD notified. Sent to scan.

## 2015-02-25 ENCOUNTER — Telehealth: Payer: Self-pay | Admitting: Hematology and Oncology

## 2015-02-25 NOTE — Telephone Encounter (Signed)
Per 02/24/15 staff message from RN Karna Christmas) we received notice yesterday re patient's appointment for the pain clinic ([facility])

## 2015-03-02 ENCOUNTER — Ambulatory Visit: Payer: Medicare Other | Attending: General Surgery | Admitting: Physical Therapy

## 2015-03-02 DIAGNOSIS — M25612 Stiffness of left shoulder, not elsewhere classified: Secondary | ICD-10-CM | POA: Insufficient documentation

## 2015-03-02 DIAGNOSIS — R6889 Other general symptoms and signs: Secondary | ICD-10-CM | POA: Diagnosis present

## 2015-03-02 DIAGNOSIS — M25511 Pain in right shoulder: Secondary | ICD-10-CM | POA: Diagnosis present

## 2015-03-02 DIAGNOSIS — M25611 Stiffness of right shoulder, not elsewhere classified: Secondary | ICD-10-CM

## 2015-03-02 DIAGNOSIS — Z9189 Other specified personal risk factors, not elsewhere classified: Secondary | ICD-10-CM | POA: Diagnosis present

## 2015-03-02 DIAGNOSIS — M25512 Pain in left shoulder: Secondary | ICD-10-CM | POA: Diagnosis present

## 2015-03-02 NOTE — Patient Instructions (Signed)
Cane Overhead - Supine  Hold cane at thighs with both hands, extend arms straight over head. Hold 1-2___ seconds. Repeat _10__ times. Do _3__ times per day. SLOWLY    Hold stick with arms wide. Move stick from side to side stretching out your shoulder Hold for 2 seconds. Repeat 10 times Do 3 times per day SLOWLY   Copyright  VHI. All rights reserved.

## 2015-03-02 NOTE — Therapy (Signed)
[place], [place], [address] Phone: [phone]   Fax:  [phone]  Physical Therapy Re-Evaluation  Patient Details  Name: Paula Navarro MRN: [id number] Date of Birth: 1951-07-13 Referring Provider:  Jovita Kussmaul, MD  Encounter Date: 03/02/2015      PT End of Session - 03/02/15 1417    Visit Number 1   Number of Visits 9   Date for PT Re-Evaluation 04/06/15   PT Start Time 0900   PT Stop Time 0930   PT Time Calculation (min) 30 min   Activity Tolerance Patient tolerated treatment well   Behavior During Therapy [facility] for tasks assessed/performed      Past Medical History  Diagnosis Date  . Chronic bronchitis   . Palpitation     Tachycardia reported by monitor clerk during a symptomatic spell  . Chest pain     Admitted to [facility] in 09/2011; refused stress test  . Atrial septal defect 1996    Surgical repair in 1996  . Tobacco abuse     60 pack years; 1.5 packs per day  . Anxiety   . Anemia   . Breast cancer   . Wears dentures     top  . COPD (chronic obstructive pulmonary disease)     on xray  . Chronic pain   . Radiation 06/30/14-08/17/14    Bilateral Breast    Past Surgical History  Procedure Laterality Date  . Cholecystectomy    . Cesarean section      x3  . Breast biopsy Bilateral   . Tubal ligation    . Asd repair, ostium primum  1996    dr Roxy Horseman  . Port a cath revision  1/15    put in   . Breast lumpectomy with radioactive seed localization Bilateral 04/08/2014    Procedure: BILATERAL  RADIOACTIVE SEED LOCALIZATION LUMPECTOMY ;  Surgeon: Autumn Messing III, MD;  Location: [facility];  Service: General;  Laterality: Bilateral;  . Axillary lymph node dissection Bilateral 04/08/2014    Procedure:  BILATERAL AXILLARY LYMPH NODE DISSECTION;  Surgeon: Autumn Messing III, MD;  Location: [facility];  Service: General;  Laterality: Bilateral;  . Open heart surgery       There were no vitals filed for this visit.  Visit Diagnosis:  At risk for lymphedema  Impaired function of upper extremity  Stiffness of joint, shoulder region, left  Pain in joint involving shoulder region, left  Pain in joint involving shoulder region, right  Stiffness of joint, shoulder region, right      Subjective Assessment - 03/02/15 0905    Subjective pt has not been here since Dec 23, 2014,  Her knee went out, and she had to go to the ER, her mom had to take care of her and she couldn't walk. Her knee improved with time and now she still has occasional pain in her knee and she is thinking about getting an elevated commode. She now retuns to PT to work on her her arms    Pertinent History Approx. 8 months s/p bilateral lumpectomies with axillary node dissections (11 right, 3-6 on left) for bilateral breast cancer; neoadjuvant chemotherapy completed July 2015; radiation completed about a month ago to both breasts.  Got shingles in the left arm but that's now healed.  h/o open heart surgery for septal defect 1996.   Patient Stated Goals get rid of the pain and swellling in her arm.  Currently in Pain? Yes   Pain Score 5    Pain Location Arm   Pain Orientation Left   Pain Descriptors / Indicators Sore   Pain Type Chronic pain   Pain Radiating Towards across chest    Pain Onset More than a month ago   Pain Frequency Constant   Aggravating Factors  using the arm    Pain Relieving Factors medicine    Effect of Pain on Daily Activities stops doing daily activites when it hurts             [facility] PT Assessment - 03/02/15 0001    Assessment   Medical Diagnosis bilateral breast cancer   Precautions   Precautions Other (comment)  cancer precautions   Restrictions   Weight Bearing Restrictions No   Home Environment   Living Environment Private residence   Living Arrangements Parent  she takes care of her 21 year-old mother   Type of [place]  rental house with  leaking roof   Home Layout One level   Prior Function   Level of Independence Independent with basic ADLs;Independent with homemaking with ambulation;Independent with gait   Vocation Retired;On disability   Leisure no regular exercise except household chores; likes to go for drives with her mother; active in church circle   Observation/Other Assessments   Observations 4 inch scar at sternum from h/o open heart surgery, also with abdominal scar from gall bladder surgery and pt reports her abdomen in larger    Quick DASH  61.36   Posture/Postural Control   Posture/Postural Control Postural limitations   Postural Limitations Rounded Shoulders;Forward head  elevated shoulders   AROM   Right Shoulder Flexion 120 Degrees   Right Shoulder ABduction 123 Degrees  toward scaption   Right Shoulder Internal Rotation --   Right Shoulder External Rotation --   Left Shoulder Flexion 135 Degrees   Left Shoulder ABduction 85 Degrees   Left Shoulder Internal Rotation --   Left Shoulder External Rotation --   Strength   Right Hand Grip (lbs) 26  25/28/25   Left Hand Grip (lbs) 16  05/18/19   Palpation   Palpation comment --   Ambulation/Gait   Ambulation/Gait Yes   Ambulation/Gait Assistance 7: Independent           LYMPHEDEMA/ONCOLOGY QUESTIONNAIRE - 03/02/15 0916    Right Upper Extremity Lymphedema   10 cm Proximal to Olecranon Process 30.2 cm   Olecranon Process 23.7 cm   10 cm Proximal to Ulnar Styloid Process 19.5 cm   Just Proximal to Ulnar Styloid Process 14.9 cm   Across Hand at [employer] 18.5 cm   At [place] of 2nd Digit 6.2 cm   Left Upper Extremity Lymphedema   10 cm Proximal to Olecranon Process 29.5 cm   Olecranon Process 23.3 cm   10 cm Proximal to Ulnar Styloid Process 19 cm   Just Proximal to Ulnar Styloid Process 14.4 cm   Across Hand at [employer] 17.7 cm   At [place] of 2nd Digit 6 cm   Other edema at anterior and lateral chest not easily measured             Quick Dash - 03/02/15 0001    Open a tight or new jar Moderate difficulty   Do heavy household chores (wash walls, wash floors) Moderate difficulty   Carry a shopping bag or briefcase Moderate difficulty   Wash your back Moderate difficulty   Use a knife  to cut food Moderate difficulty   Recreational activities in which you take some force or impact through your arm, shoulder, or hand (golf, hammering, tennis) Moderate difficulty   During the past week, to what extent has your arm, shoulder or hand problem interfered with your normal social activities with family, friends, neighbors, or groups? Extremely   During the past week, to what extent has your arm, shoulder or hand problem limited your work or other regular daily activities Modererately   Arm, shoulder, or hand pain. Severe   Tingling (pins and needles) in your arm, shoulder, or hand Severe   Difficulty Sleeping Severe difficulty   DASH Score 61.36 %             [facility] Adult PT Treatment/Exercise - 18-Mar-2015 0001    Shoulder Exercises: Supine   Flexion AAROM;10 reps  dowel rod    ABduction AAROM;10 reps  dowel rod                PT Education - 03/18/2015 1419    Education provided Yes   Education Details supine dowel exercise for flexion and abduction   Person(s) Educated Patient   Methods Explanation;Demonstration;Handout   Comprehension Verbalized understanding;Returned demonstration                [facility] Goals - 03/18/2015 1425    CC Long Term Goal  #1   Title reduce dash score to 20 or less indicating improved function of arms   Baseline 64 11/2014, 61.36 at 03-18-15   Time 4   Period Weeks   Status New   CC Long Term Goal  #2   Title left shoulder abduction to at least 140 degrees for improved ADLs   Time 4   Period Weeks   Status New   CC Long Term Goal  #3   Title bilateral shoulder flexion to at least 140 degrees for improved overhead reach   Time 4   Period Weeks    Status New   CC Long Term Goal  #4   Title patient will be knowledgeable about lymphedema risk reduction practices, where and how to obtain compression sleeves   Time 4   Period Weeks   Status New            Plan - 03-18-15 1419    Clinical Impression Statement pt returns to PT after ~ 9 weeks during which she had an episode of knee pain requiring an ED visit and has limited ambulation.  She now has normal mobility and wants to resume PT for her arms.  RE-evaluation  was performed. She contiues with pain, decreased range of motion of both shoulders with swelling expecially at left anterior and lateral chest    Pt will benefit from skilled therapeutic intervention in order to improve on the following deficits Pain;Decreased range of motion;Impaired UE functional use;Increased edema   Clinical Impairments Affecting Rehab Potential had to stop last episode because of severe knee pain limiting mobility    PT Frequency 2x / week   PT Duration 4 weeks   PT Treatment/Interventions Manual techniques;Manual lymph drainage;Patient/family education;ADLs/Self Care Home Management;Passive range of motion;Therapeutic exercise;Therapeutic activities;Taping   PT Next Visit Plan Begin bilateral shoulder P/AA/AROM and HEP instruction; manual lymph drainage especially for bilateral chest areas, directing towards groins; instruct patient in self massage.   Consulted and Agree with Plan of Care Patient          G-Codes - 03-18-2015 1427  Functional Assessment Tool Used quick dash   Functional Limitation Carrying, moving and handling objects   Carrying, Moving and Handling Objects Current Status [phone]) At least 60 percent but less than 80 percent impaired, limited or restricted   Carrying, Moving and Handling Objects Goal Status (D6644) At least 20 percent but less than 40 percent impaired, limited or restricted       Problem List Patient Active Problem List   Diagnosis Date Noted  . Rash  09/24/2014  . Vaginal bleeding 09/24/2014  . Postherpetic neuralgia 09/03/2014  . Lymphedema 09/03/2014  . Suspected herpes zoster left C5 distribution 08/14/2014  . Neuropathy due to chemotherapeutic drug 03/03/2014  . Hand foot syndrome 03/03/2014  . Anxiety 03/03/2014  . Bilateral breast cancer 08/11/2013  . Palpitation   . Chest pain   . Atrial septal defect   . Laboratory test 11/25/2011  . Chronic bronchitis   . Tobacco abuse    Donato Heinz. Owens Shark, PT  03/02/2015, 2:28 PM  [place], [place], [address] Phone: [phone]   Fax:  [phone]

## 2015-03-04 ENCOUNTER — Ambulatory Visit: Payer: Medicare Other

## 2015-03-04 DIAGNOSIS — M25611 Stiffness of right shoulder, not elsewhere classified: Secondary | ICD-10-CM

## 2015-03-04 DIAGNOSIS — Z9189 Other specified personal risk factors, not elsewhere classified: Secondary | ICD-10-CM

## 2015-03-04 DIAGNOSIS — M25612 Stiffness of left shoulder, not elsewhere classified: Secondary | ICD-10-CM

## 2015-03-04 DIAGNOSIS — M25511 Pain in right shoulder: Secondary | ICD-10-CM

## 2015-03-04 DIAGNOSIS — M25512 Pain in left shoulder: Secondary | ICD-10-CM

## 2015-03-04 DIAGNOSIS — R6889 Other general symptoms and signs: Secondary | ICD-10-CM

## 2015-03-04 NOTE — Therapy (Signed)
[place], [place], [address] Phone: [phone]   Fax:  [phone]  Physical Therapy Treatment  Patient Details  Name: Paula Navarro MRN: [id number] Date of Birth: 08/25/1950 Referring Provider:  [name], MD  Encounter Date: 03/04/2015      PT End of Session - 03/04/15 0819    Visit Number 2   Number of Visits 9   Date for PT Re-Evaluation 04/06/15   PT Start Time 0806   PT Stop Time 0848   PT Time Calculation (min) 42 min   Activity Tolerance Patient tolerated treatment well   Behavior During Therapy [facility] for tasks assessed/performed      Past Medical History  Diagnosis Date  . Chronic bronchitis   . Palpitation     Tachycardia reported by monitor clerk during a symptomatic spell  . Chest pain     Admitted to [facility] in 09/2011; refused stress test  . Atrial septal defect 1996    Surgical repair in 1996  . Tobacco abuse     60 pack years; 1.5 packs per day  . Anxiety   . Anemia   . Breast cancer   . Wears dentures     top  . COPD (chronic obstructive pulmonary disease)     on xray  . Chronic pain   . Radiation 06/30/14-08/17/14    Bilateral Breast    Past Surgical History  Procedure Laterality Date  . Cholecystectomy    . Cesarean section      x3  . Breast biopsy Bilateral   . Tubal ligation    . Asd repair, ostium primum  1996    dr Roxy Horseman  . Port a cath revision  1/15    put in   . Breast lumpectomy with radioactive seed localization Bilateral 04/08/2014    Procedure: BILATERAL  RADIOACTIVE SEED LOCALIZATION LUMPECTOMY ;  Surgeon: Autumn Messing III, MD;  Location: [place];  Service: General;  Laterality: Bilateral;  . Axillary lymph node dissection Bilateral 04/08/2014    Procedure:  BILATERAL AXILLARY LYMPH NODE DISSECTION;  Surgeon: Autumn Messing III, MD;  Location: [place];  Service: General;  Laterality: Bilateral;  . Open heart surgery       There were no vitals filed for this visit.  Visit Diagnosis:  At risk for lymphedema  Impaired function of upper extremity  Stiffness of joint, shoulder region, left  Pain in joint involving shoulder region, left  Pain in joint involving shoulder region, right  Stiffness of joint, shoulder region, right      Subjective Assessment - 03/04/15 0809    Subjective Feel pretty good today, the exercises are helping. Not having any pain right now, just sore after my exercises.   Currently in Pain? No/denies                         [facility] Adult PT Treatment/Exercise - 03/04/15 0001    Shoulder Exercises: Pulleys   Flexion 2 minutes   ABduction 2 minutes   Other Pulley Exercises Pt able to get pulleys at home, reports will do so today.   Shoulder Exercises: Therapy Ball   Flexion 10 reps  Roll yellow ball up wall   Manual Therapy   Manual Lymphatic Drainage (MLD) short neck, superficial and deep abdomen, right groin and axillo-inguinal anastomosis, then same on left side; reviewed principles of manual lymph drainage to patient and proper diaphragmatic  breathing today.                        [facility] Goals - 03/02/15 1425    CC Long Term Goal  #1   Title reduce dash score to 20 or less indicating improved function of arms   Baseline 64 11/2014, 61.36 at 03/02/2015   Time 4   Period Weeks   Status New   CC Long Term Goal  #2   Title left shoulder abduction to at least 140 degrees for improved ADLs   Time 4   Period Weeks   Status New   CC Long Term Goal  #3   Title bilateral shoulder flexion to at least 140 degrees for improved overhead reach   Time 4   Period Weeks   Status New   CC Long Term Goal  #4   Title patient will be knowledgeable about lymphedema risk reduction practices, where and how to obtain compression sleeves   Time 4   Period Weeks   Status New            Plan - 03/04/15 3007    Clinical Impression  Statement Pt running late for appt today. Verbally progressed her HEP to include pulleys as she reports will have access to thses and rolling ball up wall. Also reviewed self manual lymph drainage.    Pt will benefit from skilled therapeutic intervention in order to improve on the following deficits Pain;Decreased range of motion;Impaired UE functional use;Increased edema   Rehab Potential Good   Clinical Impairments Affecting Rehab Potential had to stop last episode because of severe knee pain limiting mobility    PT Frequency 2x / week   PT Duration 4 weeks   PT Treatment/Interventions Manual techniques;Manual lymph drainage;Patient/family education;ADLs/Self Care Home Management;Passive range of motion;Therapeutic exercise;Therapeutic activities;Taping   PT Next Visit Plan Continue bilateral shoulder P/AA/AROM and HEP instruction; manual lymph drainage especially for bilateral chest areas, directing towards groins; continue to instruct patient in self massage.   Consulted and Agree with Plan of Care Patient        Problem List Patient Active Problem List   Diagnosis Date Noted  . Rash 09/24/2014  . Vaginal bleeding 09/24/2014  . Postherpetic neuralgia 09/03/2014  . Lymphedema 09/03/2014  . Suspected herpes zoster left C5 distribution 08/14/2014  . Neuropathy due to chemotherapeutic drug 03/03/2014  . Hand foot syndrome 03/03/2014  . Anxiety 03/03/2014  . Bilateral breast cancer 08/11/2013  . Palpitation   . Chest pain   . Atrial septal defect   . Laboratory test 11/25/2011  . Chronic bronchitis   . Tobacco abuse     Otelia Limes, [place] 03/04/2015, 8:50 AM  [place], [place], [address] Phone: [phone]   Fax:  [phone]

## 2015-03-05 ENCOUNTER — Telehealth: Payer: Self-pay | Admitting: *Deleted

## 2015-03-05 NOTE — Telephone Encounter (Signed)
Patient called wanting to know what to take for her stomach. Patient stated that she had some vomiting and diarrhea but it has stopped, no nausea. Patient stated that her stomach was hurting. Advised patient to try clear liquids and bland foods such as toast. She verbalized understanding.

## 2015-03-09 ENCOUNTER — Ambulatory Visit: Payer: Medicare Other

## 2015-03-09 DIAGNOSIS — R6889 Other general symptoms and signs: Secondary | ICD-10-CM

## 2015-03-09 DIAGNOSIS — Z9189 Other specified personal risk factors, not elsewhere classified: Secondary | ICD-10-CM | POA: Diagnosis not present

## 2015-03-09 DIAGNOSIS — M25512 Pain in left shoulder: Secondary | ICD-10-CM

## 2015-03-09 DIAGNOSIS — M25511 Pain in right shoulder: Secondary | ICD-10-CM

## 2015-03-09 DIAGNOSIS — M25612 Stiffness of left shoulder, not elsewhere classified: Secondary | ICD-10-CM

## 2015-03-09 DIAGNOSIS — M25611 Stiffness of right shoulder, not elsewhere classified: Secondary | ICD-10-CM

## 2015-03-09 NOTE — Therapy (Signed)
[place], [place], [address] Phone: [phone]   Fax:  [phone]  Physical Therapy Treatment  Patient Details  Name: Paula Navarro MRN: [id number] Date of Birth: [birth year] Referring Provider:  Jovita Kussmaul, MD  Encounter Date: 03/09/2015      PT End of Session - 03/09/15 1154    Visit Number 3   Number of Visits 9   Date for PT Re-Evaluation 04/06/15   PT Start Time 1108   PT Stop Time 1150   PT Time Calculation (min) 42 min   Activity Tolerance Patient tolerated treatment well   Behavior During Therapy [facility] for tasks assessed/performed      Past Medical History  Diagnosis Date  . Chronic bronchitis   . Palpitation     Tachycardia reported by monitor clerk during a symptomatic spell  . Chest pain     Admitted to [facility] in 09/2011; refused stress test  . Atrial septal defect 1996    Surgical repair in 1996  . Tobacco abuse     60 pack years; 1.5 packs per day  . Anxiety   . Anemia   . Breast cancer   . Wears dentures     top  . COPD (chronic obstructive pulmonary disease)     on xray  . Chronic pain   . Radiation 06/30/14-08/17/14    Bilateral Breast    Past Surgical History  Procedure Laterality Date  . Cholecystectomy    . Cesarean section      x3  . Breast biopsy Bilateral   . Tubal ligation    . Asd repair, ostium primum  1996    dr Roxy Horseman  . Port a cath revision  1/15    put in   . Breast lumpectomy with radioactive seed localization Bilateral 04/08/2014    Procedure: BILATERAL  RADIOACTIVE SEED LOCALIZATION LUMPECTOMY ;  Surgeon: Autumn Messing III, MD;  Location: [place];  Service: General;  Laterality: Bilateral;  . Axillary lymph node dissection Bilateral 04/08/2014    Procedure:  BILATERAL AXILLARY LYMPH NODE DISSECTION;  Surgeon: Autumn Messing III, MD;  Location: [place];  Service: General;  Laterality: Bilateral;  . Open heart surgery       There were no vitals filed for this visit.  Visit Diagnosis:  At risk for lymphedema  Impaired function of upper extremity  Stiffness of joint, shoulder region, left  Pain in joint involving shoulder region, left  Pain in joint involving shoulder region, right  Stiffness of joint, shoulder region, right      Subjective Assessment - 03/09/15 1113    Subjective It keeps feeling better, been doing the exercises at home. Going to try to get some pulleys later today.   Currently in Pain? No/denies            [facility] PT Assessment - 03/09/15 0001    AROM   Right Shoulder Flexion 137 Degrees   Right Shoulder ABduction 174 Degrees   Left Shoulder Flexion 135 Degrees   Left Shoulder ABduction 170 Degrees                     [facility] Adult PT Treatment/Exercise - 03/09/15 0001    Shoulder Exercises: Pulleys   Flexion 2 minutes   ABduction 2 minutes   Shoulder Exercises: Therapy Ball   Flexion 10 reps  Roll yellow ball up wall   Manual Therapy   Manual Lymphatic  Drainage (MLD) short neck, superficial and deep abdomen, right groin and axillo-inguinal anastomosis, and Rt chest, then same on left side; reviewed principles of manual lymph drainage to patient and proper diaphragmatic breathing today.                PT Education - 03/09/15 1153    Education provided Yes   Education Details lymphedema risk reduction practices   Person(s) Educated Patient   Methods Explanation;Handout   Comprehension Verbalized understanding                [facility] Goals - 03/09/15 1126    CC Long Term Goal  #1   Title reduce dash score to 20 or less indicating improved function of arms   Status On-going   CC Long Term Goal  #2   Title left shoulder abduction to at least 140 degrees for improved ADLs  >170 in bil   Status Achieved   CC Long Term Goal  #3   Title bilateral shoulder flexion to at least 140 degrees for improved overhead reach  137 in Rt,  135 in Lt   CC Long Term Goal  #4   Title patient will be knowledgeable about lymphedema risk reduction practices, where and how to obtain compression sleeves   Status Achieved   CC Long Term Goal  #5   Title report pain decrease of at least 60%  80% in Lt UE, 100% in Rt UE   Status Achieved            Plan - 03/09/15 1154    Clinical Impression Statement Pt doing very well with her progress and being compliant with HEP. She has met her abduction ROM goal, and has almost met her flexion goal. Also instructed pt in lymphedema risk reduction practices and issued handout, and she verbalized good understanding of this, this goal also met.    Pt will benefit from skilled therapeutic intervention in order to improve on the following deficits Pain;Decreased range of motion;Impaired UE functional use;Increased edema   Rehab Potential Good   Clinical Impairments Affecting Rehab Potential had to stop last episode because of severe knee pain limiting mobility    PT Frequency 2x / week   PT Duration 4 weeks   PT Treatment/Interventions Manual techniques;Manual lymph drainage;Patient/family education;ADLs/Self Care Home Management;Passive range of motion;Therapeutic exercise;Therapeutic activities;Taping   PT Next Visit Plan Have pt redo DASH for goal assess. Continue bilateral shoulder P/AA/AROM and HEP instruction; instruct and issue handout for manual lymph drainage especially for bilateral chest areas, but include UE's, directing towards groins. Possible discharge in next few visits as pt has made great progress with goals and was verbalizing she felt almost ready.    Consulted and Agree with Plan of Care Patient        Problem List Patient Active Problem List   Diagnosis Date Noted  . Rash 09/24/2014  . Vaginal bleeding 09/24/2014  . Postherpetic neuralgia 09/03/2014  . Lymphedema 09/03/2014  . Suspected herpes zoster left C5 distribution 08/14/2014  . Neuropathy due to chemotherapeutic  drug 03/03/2014  . Hand foot syndrome 03/03/2014  . Anxiety 03/03/2014  . Bilateral breast cancer 08/11/2013  . Palpitation   . Chest pain   . Atrial septal defect   . Laboratory test 11/25/2011  . Chronic bronchitis   . Tobacco abuse     Otelia Limes, [place] 03/09/2015, 12:02 PM  [place], [place], [address]  Phone: [phone]   Fax:  [phone]

## 2015-03-10 ENCOUNTER — Telehealth: Payer: Self-pay

## 2015-03-10 NOTE — Telephone Encounter (Signed)
Drug utilization review dtd 02/26/15 rcvd from  [facility].  Reviewed by Dr. Lindi Adie.  Sent to scan.

## 2015-03-11 ENCOUNTER — Ambulatory Visit: Payer: Medicare Other

## 2015-03-15 ENCOUNTER — Ambulatory Visit: Payer: Medicare Other | Admitting: Physical Therapy

## 2015-03-15 ENCOUNTER — Telehealth: Payer: Self-pay | Admitting: *Deleted

## 2015-03-15 ENCOUNTER — Ambulatory Visit (HOSPITAL_BASED_OUTPATIENT_CLINIC_OR_DEPARTMENT_OTHER): Payer: Medicare Other | Admitting: Nurse Practitioner

## 2015-03-15 VITALS — BP 122/73 | HR 88 | Temp 98.4°F | Resp 16 | Wt 138.4 lb

## 2015-03-15 DIAGNOSIS — Z9189 Other specified personal risk factors, not elsewhere classified: Secondary | ICD-10-CM | POA: Diagnosis not present

## 2015-03-15 DIAGNOSIS — M25512 Pain in left shoulder: Secondary | ICD-10-CM

## 2015-03-15 DIAGNOSIS — C50912 Malignant neoplasm of unspecified site of left female breast: Secondary | ICD-10-CM

## 2015-03-15 DIAGNOSIS — G8929 Other chronic pain: Secondary | ICD-10-CM | POA: Diagnosis not present

## 2015-03-15 DIAGNOSIS — I890 Lymphedema, not elsewhere classified: Secondary | ICD-10-CM | POA: Diagnosis not present

## 2015-03-15 DIAGNOSIS — M25611 Stiffness of right shoulder, not elsewhere classified: Secondary | ICD-10-CM

## 2015-03-15 DIAGNOSIS — C50911 Malignant neoplasm of unspecified site of right female breast: Secondary | ICD-10-CM | POA: Diagnosis not present

## 2015-03-15 DIAGNOSIS — R6889 Other general symptoms and signs: Secondary | ICD-10-CM

## 2015-03-15 DIAGNOSIS — M25511 Pain in right shoulder: Secondary | ICD-10-CM

## 2015-03-15 DIAGNOSIS — M25612 Stiffness of left shoulder, not elsewhere classified: Secondary | ICD-10-CM

## 2015-03-15 MED ORDER — OXYCODONE-ACETAMINOPHEN 5-325 MG PO TABS: 1.0000 | ORAL_TABLET | Freq: Three times a day (TID) | ORAL | Status: DC | PRN

## 2015-03-15 MED ORDER — LORAZEPAM 1 MG PO TABS: 1.0000 mg | ORAL_TABLET | Freq: Every day | ORAL | Status: DC

## 2015-03-15 NOTE — Therapy (Signed)
[place], [place], [address] Phone: [phone]   Fax:  [phone]  Physical Therapy Treatment  Patient Details  Name: Paula Navarro MRN: [id number] Date of Birth: 02-23-1951 Referring Provider:  Jovita Kussmaul, MD  Encounter Date: 03/15/2015      PT End of Session - 03/15/15 1732    Visit Number 4   Number of Visits 9   Date for PT Re-Evaluation 04/06/15   PT Start Time 0623   PT Stop Time 1436   PT Time Calculation (min) 51 min   Activity Tolerance Patient tolerated treatment well   Behavior During Therapy [facility] for tasks assessed/performed      Past Medical History  Diagnosis Date  . Chronic bronchitis   . Palpitation     Tachycardia reported by monitor clerk during a symptomatic spell  . Chest pain     Admitted to [facility] in 09/2011; refused stress test  . Atrial septal defect 1996    Surgical repair in 1996  . Tobacco abuse     60 pack years; 1.5 packs per day  . Anxiety   . Anemia   . Breast cancer   . Wears dentures     top  . COPD (chronic obstructive pulmonary disease)     on xray  . Chronic pain   . Radiation 06/30/14-08/17/14    Bilateral Breast    Past Surgical History  Procedure Laterality Date  . Cholecystectomy    . Cesarean section      x3  . Breast biopsy Bilateral   . Tubal ligation    . Asd repair, ostium primum  1996    dr Roxy Horseman  . Port a cath revision  1/15    put in   . Breast lumpectomy with radioactive seed localization Bilateral 04/08/2014    Procedure: BILATERAL  RADIOACTIVE SEED LOCALIZATION LUMPECTOMY ;  Surgeon: Autumn Messing III, MD;  Location: [place];  Service: General;  Laterality: Bilateral;  . Axillary lymph node dissection Bilateral 04/08/2014    Procedure:  BILATERAL AXILLARY LYMPH NODE DISSECTION;  Surgeon: Autumn Messing III, MD;  Location: [place];  Service: General;  Laterality: Bilateral;  . Open heart surgery       There were no vitals filed for this visit.  Visit Diagnosis:  At risk for lymphedema  Impaired function of upper extremity  Stiffness of joint, shoulder region, left  Pain in joint involving shoulder region, left  Pain in joint involving shoulder region, right  Stiffness of joint, shoulder region, right      Subjective Assessment - 03/15/15 1350    Subjective I'm doing the exercises at home and I think it helps.  This arm still hurts, though.   Currently in Pain? Yes   Pain Score 5    Pain Location Axilla   Pain Orientation Left   Pain Descriptors / Indicators Sore   Aggravating Factors  surgery or driving?   Pain Relieving Factors pain pill, advil            [facility] PT Assessment - 03/15/15 0001    Observation/Other Assessments   Quick DASH  45.45              Quick Dash - 03/15/15 0001    Open a tight or new jar Moderate difficulty   Do heavy household chores (wash walls, wash floors) Moderate difficulty   Carry a shopping bag or briefcase Mild difficulty  Wash your back Unable   Use a knife to cut food Mild difficulty   Recreational activities in which you take some force or impact through your arm, shoulder, or hand (golf, hammering, tennis) Mild difficulty   During the past week, to what extent has your arm, shoulder or hand problem interfered with your normal social activities with family, friends, neighbors, or groups? Quite a bit   During the past week, to what extent has your arm, shoulder or hand problem limited your work or other regular daily activities Quite a bit   Arm, shoulder, or hand pain. Moderate   Difficulty Sleeping Moderate difficulty   DASH Score 45.45 %               [facility] Adult PT Treatment/Exercise - 03/15/15 0001    Shoulder Exercises: Pulleys   Flexion 2 minutes   ABduction 2 minutes   Shoulder Exercises: Therapy Ball   Flexion 10 reps  Roll yellow ball up wall   Manual Therapy   Manual Lymphatic Drainage (MLD)  short neck, superficial and deep abdomen, right groin and axillo-inguinal anastomosis, and Rt chest and arm, then same on left side; reviewed principles of manual lymph drainage to patient and proper diaphragmatic breathing today.                PT Education - 03/15/15 1730    Education provided Yes   Education Details manual lymph drainage for both breasts and arms   Person(s) Educated Patient   Methods Explanation;Demonstration;Tactile cues;Verbal cues;Handout   Comprehension Verbalized understanding                [facility] Goals - 03/15/15 1734    CC Long Term Goal  #1   Baseline 45.45 on 03/15/15   Status On-going            Plan - 03/15/15 1732    Clinical Impression Statement Patient reports benefit of therapy but still having some discomfort that she thinks could improve with more sessions.  She has not met her quick DASH score reduction goal.   Pt will benefit from skilled therapeutic intervention in order to improve on the following deficits Pain;Decreased range of motion;Impaired UE functional use;Increased edema   Rehab Potential Good   PT Frequency 2x / week   PT Duration 4 weeks   PT Treatment/Interventions Therapeutic exercise;Manual lymph drainage;Patient/family education   PT Next Visit Plan Continue bilat. shoulder P/AA/AROM and HEP instruction; review manual lymph drainage as needed; probably just 2-3 more visits needed.   Consulted and Agree with Plan of Care Patient        Problem List Patient Active Problem List   Diagnosis Date Noted  . Rash 09/24/2014  . Vaginal bleeding 09/24/2014  . Postherpetic neuralgia 09/03/2014  . Lymphedema 09/03/2014  . Suspected herpes zoster left C5 distribution 08/14/2014  . Neuropathy due to chemotherapeutic drug 03/03/2014  . Hand foot syndrome 03/03/2014  . Anxiety 03/03/2014  . Bilateral breast cancer 08/11/2013  . Palpitation   . Chest pain   . Atrial septal defect   . Laboratory test  11/25/2011  . Chronic bronchitis   . Tobacco abuse     Kalianna Verbeke 03/15/2015, 5:36 PM  [place], [place], [address] Phone: [phone]   Fax:  [place], PT 03/15/2015 5:36 PM

## 2015-03-15 NOTE — Telephone Encounter (Signed)
Patient called stating that she needs a percocet refill. Please call patient once it is done. Message sent to [facility].

## 2015-03-15 NOTE — Patient Instructions (Addendum)
Deep Effective Breath   Standing, sitting, or laying down, place both hands on the belly. Take a deep breath IN, expanding the belly; then breath OUT, contracting the belly. Repeat __5__ times. Do __2-3__ sessions per day and before your self massage.  http://gt2.exer.us/866   Copyright  VHI. All rights reserved.  Axilla to Inguinal Nodes - Sweep   On involved side, make 5 circles at groin at panty line, then pump _5__ times from armpit along side of trunk to outer hip, making your other pathway. Do __1_ time per day.  Draw an imaginary diagonal line from upper outer breast through the nipple area toward lower inner breast.      Direct fluid to treat all of lower outer breast tissue downward and outward toward  pathway that is aimed at the left groin.       Do this in three rows to treat all of the breast tissue, and do each row 3-4x.   Copyright  VHI. All rights reserved.  Arm Posterior: Elbow to Shoulder - Sweep   Pump _5__ times from back of elbow to top of shoulder. Then inner to outer upper arm _5_ times, then outer arm again _5_ times. Then back to the pathways _2-3_ times. Do _1__ time per day.  Copyright  VHI. All rights reserved.  ARM: Volar Wrist to Elbow - Sweep   Pump or stationary circles _5__ times from wrist to elbow making sure to do both sides of the forearm. Then retrace your steps to the outer arm, and the pathways _2-3_ times each. Do _1__ time per day.  Copyright  VHI. All rights reserved.  ARM: Dorsum of Hand to Shoulder - Sweep   Pump or stationary circles _5__ times on back of hand including knuckle spaces and individual fingers if needed working up towards the wrist, then retrace all your steps working back up the forearm, doing both sides; upper outer arm and back to your pathways _2-3_ times each. Then do 5 circles again at uninvolved armpit and involved groin where you started! Good job!! Do __1_ time per day.   DO ALL OF THE ABOVE FOR BOTH  SIDES Copyright  VHI. All rights reserved.

## 2015-03-15 NOTE — Telephone Encounter (Signed)
Pt came in to the office stating " I am here to pick up a pain prescription "  MD RN at lunch - this RN intervened per pt in waiting room.  Per discussion Devory states she spoke with a nurse " and she was going to call me back but I had to come on in to [place] so I came in to get it "  This RN informed pt Dr Lindi Adie is out of the office- discussed pain concerns and noted prescriptions.  Yorley states pain is in bilateral arms - she is wearing loose fitting compression sleeves. Pt is s/p bilateral breast surgeries and is obtained PT at this time for noted joint and post surgical discomfort.  Per chart and medication review noted prescription for percocet prescribed by ER MD in June-  Per note from July- Dr Lindi Adie recommended use of tramadol and gabapentin.  Yatzary states she " cannot take the tramadol because it makes me sick "  Pt was referred to the pain clinic due to ongoing pain with appointment scheduled for 03/01/2015-  Declynn states she was not seen " because I showed up a late and I told them I would reschedule "  This RN informed pt MD out of the office this week. Pain medication cannot be refilled unless she is seen by a provider as well as up to provider what will be prescribed.  Per discussion pt would like to be seen by symptom management after she has PT.  Appointment made for the above.  This RN called the [facility] at [phone]- verified pt was not seen and has not rescheduled an appointment at this time.

## 2015-03-15 NOTE — Telephone Encounter (Signed)
Patient to be seen in symptom management today.

## 2015-03-16 ENCOUNTER — Encounter: Payer: Self-pay | Admitting: Nurse Practitioner

## 2015-03-16 DIAGNOSIS — G8929 Other chronic pain: Secondary | ICD-10-CM | POA: Insufficient documentation

## 2015-03-16 NOTE — Assessment & Plan Note (Signed)
Patient has a history of bilateral lumpectomies; and chronic bilateral lymphedema.  She is frequently seen at the lymphedema clinic for treatment.  She also wears some very loose fitting lymphedema sleeve bilaterally as well.

## 2015-03-16 NOTE — Progress Notes (Signed)
SYMPTOM MANAGEMENT CLINIC   HPI: Paula Navarro 64 y.o. female diagnosed with bilateral breast cancer.  Patient is status post chemotherapy, radiation treatments, and bilateral lumpectomies in the past.  Currently undergoing anastrozole.  Oral therapy.   Patient has a history of chronic pain issues.  She continues to complain of chronic pain to her bilateral axilla and upper extremity; as well as her feet.  She feels that the chronic pain is associated with her previous chemotherapy and radiation.  She takes Ativan every night at bedtime for sleep; and also takes Percocet on intermittent basis for the pain.  She states that she used to take a good deal of ibuprofen; but has had to decrease ibuprofen due to some chronic issues with vaginal bleeding.  Patient reports that tramadol makes her sick.  Patient is requesting a refill of both the Ativan and the Percocet today.  HPI  ROS  Past Medical History  Diagnosis Date  . Chronic bronchitis   . Palpitation     Tachycardia reported by monitor clerk during a symptomatic spell  . Chest pain     Admitted to [facility] in 09/2011; refused stress test  . Atrial septal defect 1996    Surgical repair in 1996  . Tobacco abuse     60 pack years; 1.5 packs per day  . Anxiety   . Anemia   . Breast cancer   . Wears dentures     top  . COPD (chronic obstructive pulmonary disease)     on xray  . Chronic pain   . Radiation 06/30/14-08/17/14    Bilateral Breast    Past Surgical History  Procedure Laterality Date  . Cholecystectomy    . Cesarean section      x3  . Breast biopsy Bilateral   . Tubal ligation    . Asd repair, ostium primum  1996    dr Roxy Horseman  . Port a cath revision  1/15    put in   . Breast lumpectomy with radioactive seed localization Bilateral 04/08/2014    Procedure: BILATERAL  RADIOACTIVE SEED LOCALIZATION LUMPECTOMY ;  Surgeon: Autumn Messing III, MD;  Location: [place];  Service: General;  Laterality: Bilateral;    . Axillary lymph node dissection Bilateral 04/08/2014    Procedure:  BILATERAL AXILLARY LYMPH NODE DISSECTION;  Surgeon: Autumn Messing III, MD;  Location: [place];  Service: General;  Laterality: Bilateral;  . Open heart surgery      has Chronic bronchitis; Tobacco abuse; Laboratory test; Palpitation; Chest pain; Atrial septal defect; Bilateral breast cancer; Neuropathy due to chemotherapeutic drug; Hand foot syndrome; Anxiety; Suspected herpes zoster left C5 distribution; Postherpetic neuralgia; Lymphedema; Rash; Vaginal bleeding; and Chronic pain on her problem list.    is allergic to codeine and morphine and related.    Medication List       This list is accurate as of: 03/15/15 11:59 PM.  Always use your most recent med list.               anastrozole 1 MG tablet  Commonly known as:  ARIMIDEX  Take 1 tablet (1 mg total) by mouth daily.     clobetasol cream 0.05 %  Commonly known as:  TEMOVATE  Apply 1 application topically 2 (two) times daily. Apply to hands and feet twice a day as needed.     gabapentin 300 MG capsule  Commonly known as:  NEURONTIN  TAKE 2 CAPSULES (600 MG TOTAL)  BY MOUTH 3 (THREE) TIMES DAILY.     ibuprofen 200 MG tablet  Commonly known as:  ADVIL,MOTRIN  Take 200 mg by mouth every 6 (six) hours as needed for moderate pain.     loratadine 10 MG tablet  Commonly known as:  CLARITIN  Take 10 mg by mouth daily as needed for itching.     LORazepam 1 MG tablet  Commonly known as:  ATIVAN  Take 1 tablet (1 mg total) by mouth at bedtime.     ondansetron 4 MG tablet  Commonly known as:  ZOFRAN  Take 1 tablet (4 mg total) by mouth every 6 (six) hours.     oxyCODONE-acetaminophen 5-325 MG per tablet  Commonly known as:  PERCOCET/ROXICET  Take 1 tablet by mouth every 8 (eight) hours as needed for severe pain.     SUPER B COMPLEX PO  Take 1 tablet by mouth daily.         PHYSICAL EXAMINATION  Oncology Vitals 03/15/2015 01/07/2015  01/07/2015 01/07/2015 01/07/2015 01/07/2015 01/07/2015  Height - - - - - - -  Weight 62.778 kg - - - - - -  Weight (lbs) 138 lbs 6 oz - - - - - -  BMI (kg/m2) - - - - - - -  Temp 98.4 - - - - - -  Pulse 88 98 101 102 103 102 104  Resp 16 16 - - - - -  SpO2 99 94 99 86 91 92 89  BSA (m2) - - - - - - -   BP Readings from Last 3 Encounters:  03/15/15 122/73  01/07/15 103/88  01/01/15 113/62    Physical Exam  Constitutional: She is oriented to person, place, and time and well-developed, well-nourished, and in no distress.  HENT:  Head: Normocephalic and atraumatic.  Eyes: Conjunctivae and EOM are normal. Pupils are equal, round, and reactive to light. Right eye exhibits no discharge. Left eye exhibits no discharge. No scleral icterus.  Neck: Normal range of motion.  Pulmonary/Chest: Effort normal. No respiratory distress.  Musculoskeletal: Normal range of motion. She exhibits edema. She exhibits no tenderness.  Patient with trace lymphedema to bilateral upper extremities.  Patient has very loose fitting lymphedema sleeve to bilateral arms.  Patient continues to complain of some discomfort to her bilateral axilla regions and bilateral upper arms; but no tenderness, no erythema, no edema, no warmth, or no red streaks to the site.  Patient to perform range of motion with her arms.  Also, patient is complaining of some pain to the balls of her feet bilaterally; the patient observed with full range of motion and ambulating with no difficulty whatsoever.  Neurological: She is alert and oriented to person, place, and time. Gait normal.  Skin: Skin is warm and dry. No rash noted. No erythema. No pallor.  Psychiatric: Affect normal.  Nursing note and vitals reviewed.   LABORATORY DATA:. No visits with results within 3 Day(s) from this visit. Latest known visit with results is:  Admission on 01/07/2015, Discharged on 01/07/2015  Component Date Value Ref Range Status  . WBC 01/07/2015 17.3* 4.0 -  10.5 K/uL Final  . RBC 01/07/2015 5.04  3.87 - 5.11 MIL/uL Final  . Hemoglobin 01/07/2015 11.8* 12.0 - 15.0 g/dL Final  . HCT 01/07/2015 36.8  36.0 - 46.0 % Final  . MCV 01/07/2015 73.0* 78.0 - 100.0 fL Final  . MCH 01/07/2015 23.4* 26.0 - 34.0 pg Final  . MCHC 01/07/2015 32.1  30.0 -  36.0 g/dL Final  . RDW 01/07/2015 14.1  11.5 - 15.5 % Final  . Platelets 01/07/2015 300  150 - 400 K/uL Final  . Neutrophils Relative % 01/07/2015 82* 43 - 77 % Final  . Neutro Abs 01/07/2015 14.2* 1.7 - 7.7 K/uL Final  . Lymphocytes Relative 01/07/2015 11* 12 - 46 % Final  . Lymphs Abs 01/07/2015 1.9  0.7 - 4.0 K/uL Final  . Monocytes Relative 01/07/2015 6  3 - 12 % Final  . Monocytes Absolute 01/07/2015 1.1* 0.1 - 1.0 K/uL Final  . Eosinophils Relative 01/07/2015 0  0 - 5 % Final  . Eosinophils Absolute 01/07/2015 0.1  0.0 - 0.7 K/uL Final  . Basophils Relative 01/07/2015 0  0 - 1 % Final  . Basophils Absolute 01/07/2015 0.0  0.0 - 0.1 K/uL Final  . Sodium 01/07/2015 133* 135 - 145 mmol/L Final  . Potassium 01/07/2015 4.3  3.5 - 5.1 mmol/L Final  . Chloride 01/07/2015 95* 101 - 111 mmol/L Final  . CO2 01/07/2015 26  22 - 32 mmol/L Final  . Glucose, Bld 01/07/2015 117* 65 - 99 mg/dL Final  . BUN 01/07/2015 10  6 - 20 mg/dL Final  . Creatinine, Ser 01/07/2015 0.79  0.44 - 1.00 mg/dL Final  . Calcium 01/07/2015 9.3  8.9 - 10.3 mg/dL Final  . GFR calc non Af Amer 01/07/2015 >60  >60 mL/min Final  . GFR calc Af Amer 01/07/2015 >60  >60 mL/min Final   Comment: (NOTE) The eGFR has been calculated using the CKD EPI equation. This calculation has not been validated in all clinical situations. eGFR's persistently <60 mL/min signify possible Chronic Kidney Disease.   . Anion gap 01/07/2015 12  5 - 15 Final  . Color, Synovial 01/07/2015 YELLOW  YELLOW Final  . Appearance-Synovial 01/07/2015 TURBID* CLEAR Final  . Crystals, Fluid 01/07/2015 NO CRYSTALS SEEN   Final  . WBC, Synovial 01/07/2015 25147* 0 - 200  /cu mm Final  . Neutrophil, Synovial 01/07/2015 89* 0 - 25 % Final  . Lymphocytes-Synovial Fld 01/07/2015 7  0 - 20 % Final  . Monocyte-Macrophage-Synovial Fluid 01/07/2015 4* 50 - 90 % Final  . Eosinophils-Synovial 01/07/2015 0  0 - 1 % Final  . Other Cells-SYN 01/07/2015 0   Final  . Specimen Description 01/07/2015 FLUID LEFT KNEE   Final  . Special Requests 01/07/2015 NONE   Final  . Gram Stain 01/07/2015    Final                   Value:ABUNDANT WBC PRESENT, PREDOMINANTLY PMN NO ORGANISMS SEEN Performed at [facility] Performed at [facility]   . Culture 01/07/2015    Final                   Value:NO GROWTH 3 DAYS Performed at [facility]   . Report Status 01/07/2015 01/11/2015 FINAL   Final     RADIOGRAPHIC STUDIES: No results found.  ASSESSMENT/PLAN:    Bilateral breast cancer Bilateral breast cancer   07/23/2013 Mammogram Bilateral breast masses. 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 Left: IDC grade 3 ER positive PR negative HER-2/neu negative Ki-67 80% T2 N1 on left T2 NX right    09/15/2013 - 02/13/2014 Neo-Adjuvant Chemotherapy 5 fluorouracil, epirubicin and cyclophosphamide with Neulasta and  6 cycles followed by weekly Taxol started 12/16/2013 x8 weeks stopped 02/03/2014 for neuropathy   02/19/2014 Breast MRI Right breast: 1.9 x 0.4 x 0.8 cm (previously 1.9 x 1.1 x 1.1 cm); left breast 2.5 x 2 x 1.7 cm (previously 2.6 x 2.2 x 2.3 cm) other non-mass enhancement result, no residual axillary lymph nodes   04/08/2014 Surgery Left lumpectomy: IDC grade 3; 2.1 cm, high-grade DCIS (margin 0.1 cm), 16 lymph nodes negative T2, N0, M0 stage II A ER 6% PR 0% HER.: Right lumpectomy: IDC grade 3; 1.8 cm with high-grade DCIS 1/11 ln positive T1 C. N1 M0 stage IIB  ER 100%, PR 0%, HER-2    06/17/2014 -  Radiation Therapy Adjuvant radiation therapy   09/14/2014 -  Anti-estrogen oral therapy Anastrozole 1 mg daily        Patient continues to take anastrozole on a daily basis.  Of note-patient reports that she has recently been to her gynecologist; and was told that she has polyps.  She plans on having a gynecological surgical procedure to remove the polyps on 04/09/2015.  Patient also reports that her gynecologist informed patient that she is due for a colonoscopy as well.  Patient is scheduled to return to the [facility] for labs and a follow-up visit on 05/18/2015.  Chronic pain Patient has a history of chronic pain issues.  She continues to complain of chronic pain to her bilateral axilla and upper extremity; as well as her feet.  She feels that the chronic pain is associated with her previous chemotherapy and radiation.  She takes Ativan every night at bedtime for sleep; and also takes Percocet on intermittent basis for the pain.  She states that she used to take a good deal of ibuprofen; but has had to decrease ibuprofen due to some chronic issues with vaginal bleeding.  Patient reports that tramadol makes her sick.  Patient is requesting a refill of both the Ativan and the Percocet today.  Reviewed patient's most recent drug registry database; and it does appear the patient has received Percocet (only 4 tablets) from surgeon, Dr. Marlou Starks on 03/02/15.  Patient reports that she was scheduled to be seen at the pain clinic for initial consult in regards to managing her chronic pain.  However, patient reports that she did arrive to this appointment late-and she was told she would have to reschedule her pain clinic appointment.  Long talk with patient today regarding the need to obtain further pain management care through the pain clinic in the future.  Also, advised patient she would need to be on time for these appointments to the pain clinic.  In  the meantime-patient was given a refill of her lorazepam to take only at bedtime.  She was also given Percocet 20 tablets to use on an intermittent basis only until she is able to reschedule her pain clinic appointment.  Strongly advised patient she would need to have all future refills of narcotic pain medications refilled per the pain clinic.  Patient stated understanding of all instructions; was in agreement with this plan of care.  Lymphedema Patient has a history of bilateral lumpectomies; and chronic bilateral lymphedema.  She is frequently seen at the lymphedema clinic for treatment.  She also wears some very loose fitting lymphedema sleeve bilaterally as well.  Patient stated understanding of all instructions; and was in agreement with this plan of care. The patient knows to call the clinic with any problems, questions or concerns.   Review/collaboration with  Dr. Lindi Adie regarding all aspects of patient's visit today.   Total time spent with patient was 40 minutes;  with greater than 75 percent of that time spent in face to face counseling regarding patient's symptoms,  and coordination of care and follow up.  Disclaimer: This note was dictated with voice recognition software. Similar sounding words can inadvertently be transcribed and may not be corrected upon review.   Drue Second, NP 03/16/2015

## 2015-03-16 NOTE — Assessment & Plan Note (Signed)
Bilateral breast cancer   07/23/2013 Mammogram Bilateral breast masses. 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 N1 M0 stage IIB ER 100%, PR 0%, HER-2    06/17/2014 -  Radiation Therapy Adjuvant radiation therapy   09/14/2014 -  Anti-estrogen oral therapy Anastrozole 1 mg daily        Patient continues to take anastrozole on a daily basis.  Of note-patient reports that she has recently been to her gynecologist; and was told that she has polyps.  She plans on having a gynecological surgical procedure to remove the polyps on 04/09/2015.  Patient also reports that her gynecologist informed patient that she is due for a colonoscopy as well.  Patient is scheduled to return to the [facility] for labs and a follow-up visit on 05/18/2015.

## 2015-03-16 NOTE — Assessment & Plan Note (Signed)
Patient has a history of chronic pain issues.  She continues to complain of chronic pain to her bilateral axilla and upper extremity; as well as her feet.  She feels that the chronic pain is associated with her previous chemotherapy and radiation.  She takes Ativan every night at bedtime for sleep; and also takes Percocet on intermittent basis for the pain.  She states that she used to take a good deal of ibuprofen; but has had to decrease ibuprofen due to some chronic issues with vaginal bleeding.  Patient reports that tramadol makes her sick.  Patient is requesting a refill of both the Ativan and the Percocet today.  Reviewed patient's most recent drug registry database; and it does appear the patient has received Percocet (only 4 tablets) from surgeon, Dr. Marlou Starks on 03/02/15.  Patient reports that she was scheduled to be seen at the pain clinic for initial consult in regards to managing her chronic pain.  However, patient reports that she did arrive to this appointment late-and she was told she would have to reschedule her pain clinic appointment.  Long talk with patient today regarding the need to obtain further pain management care through the pain clinic in the future.  Also, advised patient she would need to be on time for these appointments to the pain clinic.  In the meantime-patient was given a refill of her lorazepam to take only at bedtime.  She was also given Percocet 20 tablets to use on an intermittent basis only until she is able to reschedule her pain clinic appointment.  Strongly advised patient she would need to have all future refills of narcotic pain medications refilled per the pain clinic.  Patient stated understanding of all instructions; was in agreement with this plan of care.

## 2015-03-17 ENCOUNTER — Ambulatory Visit: Payer: Medicare Other | Admitting: Physical Therapy

## 2015-03-19 ENCOUNTER — Telehealth: Payer: Self-pay | Admitting: *Deleted

## 2015-03-19 NOTE — Telephone Encounter (Signed)
Call received from patient who reports "problem with my hands and feet.  This feels like little balls and is so bad I need help.  I am not going to the pain clinic because all they do is give pain meds and control your pain medications.  I do not need that.  I only take a half a pain pill.  Am I a diabetic?  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  I don't believe I was told about this as a sided effect and if I was I didn't understand.  Now I know this is too good to be true.  I can tell a little help from gabapentin.  Can this dose be increased?  (Currently takes 600 mg TID)  Can I have Lyrica?  The commercial scared me.  The first time I took it it made me feel a little sick so I really didn't take the Lyrica but am willing to try this.  Why was I told I am really not a patient?  What does this reversal of chemotherapy mean?  They gave me chemotherapy so there must be something to give me to help this.  I am having a polyp removed from my uterus March 29, 2015 but wish they would do a hysterectomy to reduce the estrogen."   Reviewed Neuropathy and it can reverse.  Advised she wear gloves especially when washing dishes.  Wear socks, elevate feet, avoid high temperatures for baths/showers, no excessive friction to extremities.  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  Cares for a lot of people including her mother but will try to do a better job caring for herself.   Advised that she will move to a higher stage of survivorship and be released to her PCP, Dr. Everette Rank.  Return number [phone].  Return appointment with Dr. Lindi Adie October 18-2016.

## 2015-03-19 NOTE — Telephone Encounter (Signed)
AT 4:20PM TRIED TO RETURN PT.'S CALL BUT VOICE MAIL HAS NOT BEEN SET UP ON PT.'S PHONE.

## 2015-03-22 ENCOUNTER — Ambulatory Visit: Payer: Medicare Other | Admitting: Physical Therapy

## 2015-03-22 ENCOUNTER — Other Ambulatory Visit: Payer: Self-pay | Admitting: Obstetrics and Gynecology

## 2015-03-24 ENCOUNTER — Ambulatory Visit: Payer: Medicare Other | Admitting: Physical Therapy

## 2015-03-29 ENCOUNTER — Telehealth: Payer: Self-pay | Admitting: *Deleted

## 2015-03-29 ENCOUNTER — Other Ambulatory Visit: Payer: Self-pay | Admitting: *Deleted

## 2015-03-29 ENCOUNTER — Ambulatory Visit: Payer: Medicare Other

## 2015-03-29 DIAGNOSIS — C50911 Malignant neoplasm of unspecified site of right female breast: Secondary | ICD-10-CM

## 2015-03-29 DIAGNOSIS — C50912 Malignant neoplasm of unspecified site of left female breast: Principal | ICD-10-CM

## 2015-03-29 MED ORDER — OXYCODONE-ACETAMINOPHEN 5-325 MG PO TABS: 1.0000 | ORAL_TABLET | Freq: Three times a day (TID) | ORAL | Status: DC | PRN

## 2015-03-29 NOTE — Telephone Encounter (Signed)
VOICE MAIL AT 11:42AM/ RETURN CALL AT 2:05PM INFORMED PT. THAT THE PERCOCET PRESCRIPTION IS READY. SHE NEEDS TO BRING A PHOTO ID AND BE AT THE [facility]ER CENTER BY 4:00PM. PT. VOICES UNDERSTANDING.

## 2015-03-29 NOTE — Telephone Encounter (Signed)
PT. [name],NP ON 03/15/15 FOR THIS PAIN UNDER HER LEFT ARM. PT. WAS GIVEN A PRESCRIPTION FOR PERCOCET #20 ONE TABLET EVERY EIGHT HOURS PRN UNTIL SHE COULD HAVE THE SURGERY FOR A UTERINE POLYP ON 03/29/15. PT. STATES THE SURGERY HAS BEEN RESCHEDULED BY THE DOCTOR'S OFFICE TO 04/16/15. SHE NEEDS MORE PAIN MEDICATION. PT. IS GOING TO PHYSICAL THERAPY BUT IT IS NOT HELPING. SHE DOES NOT WANT TO GO TO THE PAIN CLINIC.

## 2015-03-29 NOTE — Telephone Encounter (Signed)
Attempted to call patient but she did not answer and no voice mail set up. Prescription for Percocet is ready to be picked up.

## 2015-03-31 ENCOUNTER — Ambulatory Visit: Payer: Medicare Other

## 2015-03-31 DIAGNOSIS — Z9189 Other specified personal risk factors, not elsewhere classified: Secondary | ICD-10-CM | POA: Diagnosis not present

## 2015-03-31 DIAGNOSIS — M25612 Stiffness of left shoulder, not elsewhere classified: Secondary | ICD-10-CM

## 2015-03-31 DIAGNOSIS — M25611 Stiffness of right shoulder, not elsewhere classified: Secondary | ICD-10-CM

## 2015-03-31 DIAGNOSIS — R6889 Other general symptoms and signs: Secondary | ICD-10-CM

## 2015-03-31 DIAGNOSIS — M25511 Pain in right shoulder: Secondary | ICD-10-CM

## 2015-03-31 DIAGNOSIS — M25512 Pain in left shoulder: Secondary | ICD-10-CM

## 2015-03-31 NOTE — Therapy (Signed)
[place], [place], [address] Phone: [phone]   Fax:  [phone]  Physical Therapy Treatment  Patient Details  Name: Paula Navarro MRN: [id number] Date of Birth: 1950-12-27 Referring Provider:  [name], MD  Encounter Date: 03/31/2015      PT End of Session - 03/31/15 1106    Visit Number 5   Number of Visits 9   Date for PT Re-Evaluation 04/06/15   PT Start Time 1024   PT Stop Time 1106   PT Time Calculation (min) 42 min   Activity Tolerance Patient tolerated treatment well   Behavior During Therapy [facility] for tasks assessed/performed      Past Medical History  Diagnosis Date  . Chronic bronchitis   . Palpitation     Tachycardia reported by monitor clerk during a symptomatic spell  . Chest pain     Admitted to [facility] in 09/2011; refused stress test  . Atrial septal defect 1996    Surgical repair in 1996  . Tobacco abuse     60 pack years; 1.5 packs per day  . Anxiety   . Anemia   . Breast cancer   . Wears dentures     top  . COPD (chronic obstructive pulmonary disease)     on xray  . Chronic pain   . Radiation 06/30/14-08/17/14    Bilateral Breast    Past Surgical History  Procedure Laterality Date  . Cholecystectomy    . Cesarean section      x3  . Breast biopsy Bilateral   . Tubal ligation    . Asd repair, ostium primum  1996    dr Roxy Horseman  . Port a cath revision  1/15    put in   . Breast lumpectomy with radioactive seed localization Bilateral 04/08/2014    Procedure: BILATERAL  RADIOACTIVE SEED LOCALIZATION LUMPECTOMY ;  Surgeon: Autumn Messing III, MD;  Location: [facility];  Service: General;  Laterality: Bilateral;  . Axillary lymph node dissection Bilateral 04/08/2014    Procedure:  BILATERAL AXILLARY LYMPH NODE DISSECTION;  Surgeon: Autumn Messing III, MD;  Location: [facility];  Service: General;  Laterality: Bilateral;  . Open heart surgery       There were no vitals filed for this visit.  Visit Diagnosis:  At risk for lymphedema  Impaired function of upper extremity  Stiffness of joint, shoulder region, left  Pain in joint involving shoulder region, left  Pain in joint involving shoulder region, right  Stiffness of joint, shoulder region, right      Subjective Assessment - 03/31/15 1026    Subjective My pain just isn't going away fast enough for me, I've been doing my stretches about every other day. The neuropathy has really been bothering in my hands and feet, that's why I haven't been coming. I wasn't up to driving.    Currently in Pain? Yes   Pain Score 4    Pain Location Axilla   Pain Orientation Left   Pain Descriptors / Indicators Dull   Aggravating Factors  lifting things too heavy   Pain Relieving Factors Stretching helps                         [facility] Adult PT Treatment/Exercise - 03/31/15 0001    Shoulder Exercises: Pulleys   Flexion 3 minutes   ABduction 3 minutes   Manual Therapy   Manual Lymphatic Drainage (  MLD) short neck, diaphragmatic breathing, right groin and axillo-inguinal anastomosis, and Rt chest and arm, then same on left side; reviewed principles of manual lymph drainage to patient and proper diaphragmatic breathing today.                        [facility] Goals - 03/31/15 1116    CC Long Term Goal  #1   Title reduce dash score to 20 or less indicating improved function of arms   Status On-going   CC Long Term Goal  #2   Title left shoulder abduction to at least 140 degrees for improved ADLs  172 degrees today 03/31/15   CC Long Term Goal  #3   Title bilateral shoulder flexion to at least 140 degrees for improved overhead reach  Rt shoulder WNLs at 160, Lt 133 degrees 03/31/15   Status Partially Met   CC Long Term Goal  #4   Title patient will be knowledgeable about lymphedema risk reduction practices, where and how to obtain compression  sleeves   Status Achieved   CC Long Term Goal  #5   Title report pain decrease of at least 60%  80% in Lt UE, 100% in Rt UE reported 03/31/15   Status Achieved            Plan - 03/31/15 1107    Clinical Impression Statement Pt reports she has been nervous about her Lt shoulder pain but after treatment today and seeing how much better she feels she reports will be consistent with her HEP and getting into a walking routine, especially knowing that this can possibly reduce risk of cancer reccurence. Her Rt shoulder AROM is WNLs now, her Lt shoulder flexion has not improved as pt hasn't been focusing on it as much. Reviewed the self manual lymph drainage while performing today and pt reports feels better about that now too and will do this to her Lt side as she feels her Rt side is all better. Instructed pt on importance of consistency with HEP and walking routine whether it's here at therapy or at home to help decrease her neuropathy symptoms and Lt shoulder pain and pt reported understandig this. She also verbalized wanting to exercise more. Also instructed pt that if she does not show up to next appt we will discharge her as she has missed last 2 weeks appts and she verbalized understanding this.   Pt will benefit from skilled therapeutic intervention in order to improve on the following deficits Pain;Decreased range of motion;Impaired UE functional use;Increased edema   Rehab Potential Good   Clinical Impairments Affecting Rehab Potential had to stop last episode because of severe knee pain limiting mobility    PT Frequency 2x / week   PT Duration 4 weeks   PT Treatment/Interventions Therapeutic exercise;Manual lymph drainage;Patient/family education   PT Next Visit Plan Retake DASH for goal assess. Instruct pt in Strenght ABC Program and issue handouts for this with flowsheet. Possible discharge next visit if pt does well with instruction of the former. Next appt not until 9/13, if she wants to  continue after that, renewal required.   PT Home Exercise Plan Consistency with HEP stretching, self manual lymph drainage and walking routine.    Consulted and Agree with Plan of Care Patient        Problem List Patient Active Problem List   Diagnosis Date Noted  . Chronic pain 03/16/2015  . Rash 09/24/2014  . Vaginal  bleeding 09/24/2014  . Postherpetic neuralgia 09/03/2014  . Lymphedema 09/03/2014  . Suspected herpes zoster left C5 distribution 08/14/2014  . Neuropathy due to chemotherapeutic drug 03/03/2014  . Hand foot syndrome 03/03/2014  . Anxiety 03/03/2014  . Bilateral breast cancer 08/11/2013  . Palpitation   . Chest pain   . Atrial septal defect   . Laboratory test 11/25/2011  . Chronic bronchitis   . Tobacco abuse     Otelia Limes, [place] 03/31/2015, 11:20 AM  [place], [place], [address] Phone: [phone]   Fax:  [phone]

## 2015-04-04 ENCOUNTER — Other Ambulatory Visit: Payer: Self-pay | Admitting: Hematology and Oncology

## 2015-04-09 ENCOUNTER — Telehealth: Payer: Self-pay | Admitting: *Deleted

## 2015-04-09 NOTE — Telephone Encounter (Signed)
TC from patient requesting refill on her lorazepam. Last filled on 03/15/15.

## 2015-04-09 NOTE — Telephone Encounter (Signed)
It has already been refused once.  Dr Lindi Adie will no longer fill her ativan.

## 2015-04-13 ENCOUNTER — Ambulatory Visit: Payer: Medicare Other

## 2015-04-14 ENCOUNTER — Inpatient Hospital Stay (HOSPITAL_COMMUNITY): Admission: RE | Admit: 2015-04-14 | Payer: Medicare Other | Source: Ambulatory Visit

## 2015-04-15 ENCOUNTER — Telehealth: Payer: Self-pay | Admitting: *Deleted

## 2015-04-15 NOTE — Telephone Encounter (Signed)
Paula Navarro from [facility] states Dr Garwin Brothers will be performing surgery on Paula Navarro. Pt told Dr Garwin Brothers that Dr Lindi Adie would like ovaries removed as well. Dr Garwin Brothers is asking for correspondence to confirm recommendation.  Fax: [phone]

## 2015-04-16 ENCOUNTER — Ambulatory Visit (HOSPITAL_COMMUNITY)
Admission: RE | Admit: 2015-04-16 | Payer: Medicare Other | Source: Ambulatory Visit | Admitting: Obstetrics and Gynecology

## 2015-04-16 ENCOUNTER — Encounter (HOSPITAL_COMMUNITY): Admission: RE | Payer: Self-pay | Source: Ambulatory Visit

## 2015-04-16 SURGERY — DILATATION & CURETTAGE/HYSTEROSCOPY WITH RESECTOCOPE
Anesthesia: Choice

## 2015-04-16 NOTE — Telephone Encounter (Signed)
This RN faxed written documentation from Dr. Lindi Adie to Dr. Garwin Brothers at  [facility] that patient can have an oophorectomy with a hysterectomy.

## 2015-04-19 ENCOUNTER — Telehealth: Payer: Self-pay

## 2015-04-19 NOTE — Telephone Encounter (Signed)
Faxed Dr. Geralyn Flash recommendation for oopherectomy to [facility].  Sent to scan.

## 2015-04-22 ENCOUNTER — Ambulatory Visit: Payer: Medicare Other | Attending: General Surgery | Admitting: Physical Therapy

## 2015-04-22 ENCOUNTER — Telehealth: Payer: Self-pay

## 2015-04-22 ENCOUNTER — Encounter: Payer: Self-pay | Admitting: Physical Therapy

## 2015-04-22 ENCOUNTER — Telehealth: Payer: Self-pay | Admitting: *Deleted

## 2015-04-22 ENCOUNTER — Other Ambulatory Visit: Payer: Self-pay | Admitting: Nurse Practitioner

## 2015-04-22 NOTE — Telephone Encounter (Signed)
Attempted to return call to patient about swelling over her left breast.  No answer, voice mail not set up = unable to leave a message.

## 2015-04-22 NOTE — Therapy (Signed)
[place], [place], [address] Phone: [phone]   Fax:  [phone]  Patient Details  Name: Paula Navarro MRN: [id number] Date of Birth: 02-11-1951 Referring Provider:  No ref. provider found  Encounter Date: 04/22/2015  PHYSICAL THERAPY DISCHARGE SUMMARY  Visits from Start of Care: 5  Current functional level related to goals / functional outcomes: Pt has had some problems with tingling in her hands   Remaining deficits: Some decrease in right shoulder range of motion remains   Education / Equipment: Lymphedema risk reduction, home exercise program Plan: Patient agrees to discharge.  Patient goals were partially met. Patient is being discharged due to not returning since the last visit.  ?????        Donato Heinz. Owens Shark, PT 04/22/2015, 2:54 PM  [place], [place], [address] Phone: [phone]   Fax:  [phone]

## 2015-04-22 NOTE — Telephone Encounter (Signed)
DR.GUDENA'S NURSE, [name] FRANKLIN,RN WILL CALL PT.

## 2015-04-23 ENCOUNTER — Telehealth: Payer: Self-pay

## 2015-04-23 ENCOUNTER — Other Ambulatory Visit: Payer: Self-pay

## 2015-04-23 NOTE — Progress Notes (Signed)
Call report rcvd from [place] 04/22/15 7:53 pm.  Discussed with Dr. Lindi Adie - bring pt in.   Spoke with patient - appt made for Monday at 1030.  Pt voiced understanding.    Call report sent to scan.

## 2015-04-23 NOTE — Telephone Encounter (Signed)
error 

## 2015-04-25 NOTE — Assessment & Plan Note (Signed)
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 We recommended her to pain management. Unfortunately she missed her appt and now doesn't want to go there. Shes requesting ativan and pain meds from [place]. . Anastrozole toxicities: 1. Denies any hot flashes or myalgias. 2. Tolerating it very well   Breast Cancer Surveillance: 1. Breast exam 04/26/15: Normal 2. Mammogram Will need mammogrmas scheduled

## 2015-04-26 ENCOUNTER — Telehealth: Payer: Self-pay | Admitting: Hematology and Oncology

## 2015-04-26 ENCOUNTER — Ambulatory Visit (HOSPITAL_BASED_OUTPATIENT_CLINIC_OR_DEPARTMENT_OTHER): Payer: Medicare Other | Admitting: Hematology and Oncology

## 2015-04-26 ENCOUNTER — Encounter: Payer: Self-pay | Admitting: Hematology and Oncology

## 2015-04-26 VITALS — BP 121/78 | HR 80 | Temp 98.2°F | Resp 18 | Ht 64.0 in | Wt 136.5 lb

## 2015-04-26 DIAGNOSIS — C50912 Malignant neoplasm of unspecified site of left female breast: Secondary | ICD-10-CM

## 2015-04-26 DIAGNOSIS — C50911 Malignant neoplasm of unspecified site of right female breast: Secondary | ICD-10-CM | POA: Diagnosis not present

## 2015-04-26 MED ORDER — MELOXICAM 15 MG PO TABS: 15.0000 mg | ORAL_TABLET | Freq: Every day | ORAL | Status: DC

## 2015-04-26 NOTE — Progress Notes (Signed)
Patient Care Team: [name], MD as PCP - General (Family Medicine) [name], MD (Cardiology)  DIAGNOSIS: Bilateral breast cancer   Staging form: Breast, AJCC 7th Edition     Clinical: Stage IIB (T2, N1, cM0) - Unsigned       Staging comments: Staged at breast conference 08/13/13      Pathologic: No stage assigned - Unsigned   SUMMARY OF ONCOLOGIC HISTORY:   Bilateral breast cancer   07/23/2013 Mammogram Bilateral breast masses. 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 N1 M0 stage IIB ER 100%, PR 0%, HER-2    06/17/2014 -  Radiation Therapy Adjuvant radiation therapy   09/14/2014 -  Anti-estrogen oral therapy Anastrozole 1 mg daily    CHIEF COMPLIANT:  Neuropathy related pain , swelling of the left chest wall  INTERVAL HISTORY: KARLIAH KOWALCHUK is a  64 year old white female with above-mentioned history of left breast cancer treated with neo-adjuvant chemotherapy followed by lumpectomy and radiation and is currently on anastrozole. She is tolerating  anastrozole fairly well. She has had uterine problems with bleeding and spotting was diagnosed with polyps. Is planning to undergo polypectomy. She complains of swelling of the left upper chest wall area. She plans to see Dr. Marlou Starks regarding this next month.  She has chronic neuropathic pain in her legs and feet.   She has been taking lots of Advil. She also had some oxycodone and she has been taking that half a tablet as needed. Our goal is to get her off of the narcotic pain medications. She has seen physical therapy encouraged her to do more exercise. Since she started doing exercises her pain is somewhat better. She is taking care of her mother.  REVIEW OF SYSTEMS:   Constitutional: Denies fevers, chills or abnormal weight loss Eyes: Denies blurriness of vision Ears, nose, mouth, throat, and face: Denies mucositis or sore throat Respiratory: Denies cough, dyspnea or wheezes Cardiovascular: Denies palpitation, chest discomfort or lower extremity swelling Gastrointestinal:  Denies nausea, heartburn or change in bowel habits Skin: Denies abnormal skin rashes Lymphatics: Denies new lymphadenopathy or easy bruising Neurological:Denies numbness, tingling or new weaknesses Behavioral/Psych: Mood is stable, no new changes  Breast:  Pain in the left upper chest wall into the axilla. All other systems were reviewed with the patient and are negative.  I have reviewed the past medical history, past surgical history, social history and family history with the patient and they are unchanged from previous note.  ALLERGIES:  is allergic to codeine and morphine and related.  MEDICATIONS:  Current Outpatient Prescriptions  Medication Sig Dispense Refill  . anastrozole (ARIMIDEX) 1 MG tablet Take 1 tablet (1 mg total) by mouth daily. 90 tablet 6  . B Complex-C (SUPER B COMPLEX PO) Take 1 tablet by mouth daily.    Marland Kitchen gabapentin (NEURONTIN) 300 MG capsule TAKE 2 CAPSULES (600 MG TOTAL) BY MOUTH 3 (THREE)  TIMES DAILY. 180 capsule 5  . ibuprofen (ADVIL,MOTRIN) 200 MG tablet Take 200 mg by mouth every 6 (six) hours as needed for moderate pain.     Marland Kitchen LORazepam (ATIVAN) 1 MG tablet Take 1 tablet (1 mg total) by mouth at bedtime. 30 tablet 0  . oxyCODONE-acetaminophen (PERCOCET/ROXICET) 5-325 MG per tablet Take 1 tablet by mouth every 8 (eight) hours as needed for severe pain. 30 tablet 0   No current facility-administered medications for this visit.    PHYSICAL EXAMINATION: ECOG PERFORMANCE STATUS: 1 - Symptomatic but completely ambulatory  Filed Vitals:   04/26/15 1043  BP: 121/78  Pulse: 80  Temp: 98.2 F (36.8 C)  Resp: 18   Filed Weights   04/26/15 1043  Weight: 136 lb 8 oz (61.916 kg)    GENERAL:alert, no distress and comfortable SKIN: skin color, texture, turgor are normal, no rashes or significant lesions EYES: normal, Conjunctiva are pink and non-injected, sclera clear OROPHARYNX:no exudate, no erythema and lips, buccal mucosa, and tongue normal  NECK: supple, thyroid normal size, non-tender, without nodularity LYMPH:  no palpable lymphadenopathy in the cervical, axillary or inguinal LUNGS: clear to auscultation and percussion with normal breathing effort HEART: regular rate & rhythm and no murmurs and no lower extremity edema ABDOMEN:abdomen soft, non-tender and normal bowel sounds Musculoskeletal:no cyanosis of digits and no clubbing  NEURO: alert & oriented x 3 with fluent speech, no focal motor/sensory deficits BREAST: mild to moderate swelling in the left upper chest wall with severe tenderness to palpation.. (exam performed in the presence of a chaperone)  LABORATORY DATA:  I have reviewed the data as listed   Chemistry      Component Value Date/Time   NA 133* 01/07/2015 1409   NA 141 11/12/2014 0905   K 4.3 01/07/2015 1409   K 4.5 11/12/2014 0905   CL 95* 01/07/2015 1409   CO2 26 01/07/2015 1409   CO2 24 11/12/2014 0905   BUN 10 01/07/2015 1409   BUN 13.1  11/12/2014 0905   CREATININE 0.79 01/07/2015 1409   CREATININE 0.8 11/12/2014 0905      Component Value Date/Time   CALCIUM 9.3 01/07/2015 1409   CALCIUM 9.5 11/12/2014 0905   ALKPHOS 72 11/12/2014 0905   AST 29 11/12/2014 0905   ALT 25 11/12/2014 0905   BILITOT 0.49 11/12/2014 0905       Lab Results  Component Value Date   WBC 17.3* 01/07/2015   HGB 11.8* 01/07/2015   HCT 36.8 01/07/2015   MCV 73.0* 01/07/2015   PLT 300 01/07/2015   NEUTROABS 14.2* 01/07/2015   ASSESSMENT & PLAN:   Bilateral breast cancers: Treated with neoadjuvant chemotherapy followed by surgery, currently receiving radiation treatment, antiestrogen therapy to follow  Left breast T2, N0, M0, IDC grade 3; 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 We recommended her to pain management. Unfortunately she missed her  appt and now doesn't want to go there.  I prescribed her Mobic  As we're trying to not use narcotic pain medications if we can avoid it. . Anastrozole toxicities: 1. Denies any hot flashes or myalgias. 2. Tolerating it very well   Breast Cancer Surveillance: 1. Breast exam 04/26/15: Normal 2. Mammogram Will need mammogrmas scheduled   Chest wall pain: I would like to order an ultrasound to evaluate this further.  return to clinic in 2 months for follow-up No orders of the defined types were placed in this encounter.   The patient has a good understanding of the overall plan. she agrees with it. she will call with any problems that may develop before the next visit here.   Rulon Eisenmenger, MD

## 2015-04-26 NOTE — Telephone Encounter (Signed)
Called patient and she is aware of her appointments in November  anne

## 2015-04-27 ENCOUNTER — Other Ambulatory Visit: Payer: Self-pay | Admitting: Nurse Practitioner

## 2015-04-28 ENCOUNTER — Telehealth: Payer: Self-pay | Admitting: *Deleted

## 2015-04-28 NOTE — Telephone Encounter (Signed)
Received call from [facility] at [facility] OB/gyn that patient will be having surgery done by Dr. Garwin Brothers but no date set yet. Contact number [phone] ext [phone].

## 2015-05-04 ENCOUNTER — Telehealth: Payer: Self-pay | Admitting: *Deleted

## 2015-05-04 ENCOUNTER — Other Ambulatory Visit: Payer: Self-pay | Admitting: *Deleted

## 2015-05-04 ENCOUNTER — Other Ambulatory Visit: Payer: Self-pay | Admitting: General Surgery

## 2015-05-04 DIAGNOSIS — T451X5A Adverse effect of antineoplastic and immunosuppressive drugs, initial encounter: Principal | ICD-10-CM

## 2015-05-04 DIAGNOSIS — N6320 Unspecified lump in the left breast, unspecified quadrant: Secondary | ICD-10-CM

## 2015-05-04 DIAGNOSIS — G620 Drug-induced polyneuropathy: Secondary | ICD-10-CM

## 2015-05-04 NOTE — Telephone Encounter (Signed)
Megan Salon. Trost called requesting refills and provided update. 1. "I need oxycodone for pain and I need lorazepam called in to [facility] in [place]."   2. "I saw Dr. Marlou Starks this morning. He thinks I have a lump on the left breast.  I do a lot of lifting with my left arm caring for my mother.  Dr. Marlou Starks ordered an U/S, Mammogram and if needed an MRI will be done to my left breast.  No one ever called me from scheduling for Dr. Geralyn Flash U/S order."  3. "My polyp surgery will probably be done the first week in November."  Advised that she will have to pick up narcotic prescription for pain.  Central Scheduling and [place]  phone number provided.  Expressed she "does not want to go to the pain clinic as she's heard the shots stop working."  Advised pain treatment is very individualized to type and location of pain, everyone doesn't receive shots.

## 2015-05-05 ENCOUNTER — Telehealth: Payer: Self-pay | Admitting: *Deleted

## 2015-05-05 MED ORDER — DICLOFENAC SODIUM 50 MG PO TBEC: 50.0000 mg | DELAYED_RELEASE_TABLET | Freq: Three times a day (TID) | ORAL | Status: DC

## 2015-05-05 NOTE — Telephone Encounter (Signed)
Returned call to patient - let her know Dr. Lindi Adie ordered diclofenac for her pain.  Advised patient to stop MOBIC.  Pt voiced understanding.

## 2015-05-05 NOTE — Telephone Encounter (Signed)
Voicemail retrieved with patient name, date of birth and phone number.  Forwarded to [phone].

## 2015-05-05 NOTE — Telephone Encounter (Signed)
"  Please call me about this new medication."  Message forwarded to [phone] at [phone].

## 2015-05-10 NOTE — Telephone Encounter (Signed)
Called patient to answer any questions.  Reports "[name] called her back informing her the diclofenac is for inflammation and pain.  Stopped the Mobic.  Doing fair with incident yesterday of feeling like I would pass out (dizziness).  I hadn't eaten so I make sure I eat before I take this medication.  Thanks for calling to check on me."

## 2015-05-11 ENCOUNTER — Ambulatory Visit: Payer: Medicare Other | Attending: General Surgery | Admitting: Physical Therapy

## 2015-05-11 DIAGNOSIS — R6889 Other general symptoms and signs: Secondary | ICD-10-CM | POA: Insufficient documentation

## 2015-05-11 DIAGNOSIS — Z9189 Other specified personal risk factors, not elsewhere classified: Secondary | ICD-10-CM | POA: Diagnosis present

## 2015-05-11 DIAGNOSIS — M25612 Stiffness of left shoulder, not elsewhere classified: Secondary | ICD-10-CM | POA: Diagnosis present

## 2015-05-11 NOTE — Therapy (Signed)
[place], [place], [address] Phone: [phone]   Fax:  [phone]  Physical Therapy Treatment  Patient Details  Name: Paula [place] MRN: [id number] Date of Birth: 1951/01/26 Referring Provider:  Jovita Kussmaul, MD  Encounter Date: 05/11/2015      PT End of Session - 05/11/15 1631    Visit Number 1   Number of Visits 1   PT Start Time 1105   PT Stop Time 1145   PT Time Calculation (min) 40 min   Activity Tolerance Patient tolerated treatment well   Behavior During Therapy [facility] for tasks assessed/performed      Past Medical History  Diagnosis Date  . Chronic bronchitis   . Palpitation     Tachycardia reported by monitor clerk during a symptomatic spell  . Chest pain     Admitted to [facility] in 09/2011; refused stress test  . Atrial septal defect 1996    Surgical repair in 1996  . Tobacco abuse     60 pack years; 1.5 packs per day  . Anxiety   . Anemia   . Breast cancer   . Wears dentures     top  . COPD (chronic obstructive pulmonary disease)     on xray  . Chronic pain   . Radiation 06/30/14-08/17/14    Bilateral Breast    Past Surgical History  Procedure Laterality Date  . Cholecystectomy    . Cesarean section      x3  . Breast biopsy Bilateral   . Tubal ligation    . Asd repair, ostium primum  1996    dr Roxy Horseman  . Port a cath revision  1/15    put in   . Breast lumpectomy with radioactive seed localization Bilateral 04/08/2014    Procedure: BILATERAL  RADIOACTIVE SEED LOCALIZATION LUMPECTOMY ;  Surgeon: Autumn Messing III, MD;  Location: [place];  Service: General;  Laterality: Bilateral;  . Axillary lymph node dissection Bilateral 04/08/2014    Procedure:  BILATERAL AXILLARY LYMPH NODE DISSECTION;  Surgeon: Autumn Messing III, MD;  Location: [facility];  Service: General;  Laterality: Bilateral;  . Open heart surgery      There were no vitals filed for this  visit.  Visit Diagnosis:  At risk for lymphedema  Stiffness of joint, shoulder region, left  Impaired function of upper extremity      Subjective Assessment - 05/11/15 1112    Subjective Pt states she is going for an ultrasound and a mammogram of "lump" at left anterior chest tomorrow. She states she is feeling much better now and wants to receive the exercise program she was told about when she was here last. She says she continues to do the stretching exercise she was taught here and they have been helping with her shoulder. She is getting her comopression sleeves tomorrow   Pertinent History Approx. 8 months s/p bilateral lumpectomies with axillary node dissections (11 right, 3-6 on left) for bilateral breast cancer; neoadjuvant chemotherapy completed July 2015; radiation completed about a month ago to both breasts.  Got shingles in the left arm but that's now healed.  h/o open heart surgery for septal defect 1996.   Currently in Pain? No/denies  occasional fullness under left arm, none now   Pain Location Axilla   Pain Orientation Left            [facility] PT Assessment - 05/11/15 0001    AROM  Right Shoulder Flexion 155 Degrees   Right Shoulder ABduction 165 Degrees   Left Shoulder Flexion 155 Degrees   Left Shoulder ABduction 158 Degrees              Quick Dash - 05/11/15 0001    Open a tight or new jar Moderate difficulty   Do heavy household chores (wash walls, wash floors) Moderate difficulty   Carry a shopping bag or briefcase Severe difficulty   Wash your back Unable   Use a knife to cut food Mild difficulty   Recreational activities in which you take some force or impact through your arm, shoulder, or hand (golf, hammering, tennis) Moderate difficulty   During the past week, to what extent has your arm, shoulder or hand problem interfered with your normal social activities with family, friends, neighbors, or groups? Quite a bit   During the past week, to what  extent has your arm, shoulder or hand problem limited your work or other regular daily activities Modererately   Arm, shoulder, or hand pain. Moderate   Tingling (pins and needles) in your arm, shoulder, or hand Extreme   Difficulty Sleeping So much difficuSo much difficulty, I can't sleep   DASH Score 65.91 %               [facility] Adult PT Treatment/Exercise - 05/11/15 0001    Lumbar Exercises: Supine   Bridge 5 reps   Other Supine Lumbar Exercises lower trunk rotation    Knee/Hip Exercises: Standing   Forward Step Up Both;5 reps;Step Height: 6"   Functional Squat 5 reps   Shoulder Exercises: Supine   External Rotation AROM;Both;5 reps   Flexion AAROM;10 reps  dowel rod    Other Supine Exercises chest press with no weight 2 sets of 10    Shoulder Exercises: Standing   Row AROM;Left;5 reps   Other Standing Exercises active tricep extension 5 reps                 PT Education - 05/11/15 1630    Education provided Yes   Education Details strength ABC program with modifications   Person(s) Educated Patient   Methods Explanation;Demonstration;Handout   Comprehension Verbalized understanding;Returned demonstration                [facility] Goals - 05/11/15 1636    CC Long Term Goal  #1   Title reduce dash score to 20 or less indicating improved function of arms   Status Not Met   CC Long Term Goal  #2   Title left shoulder abduction to at least 140 degrees for improved ADLs   Status Achieved   CC Long Term Goal  #3   Title bilateral shoulder flexion to at least 140 degrees for improved overhead reach   Status Achieved   CC Long Term Goal  #4   Title patient will be knowledgeable about lymphedema risk reduction practices, where and how to obtain compression sleeves   Status Achieved   CC Long Term Goal  #5   Title report pain decrease of at least 60%   Status Achieved            Plan - 05/11/15 1632    Clinical Impression Statement Pt  returns after not having been here since 03/31/2015. Depsite a worsened DASH score, she says overall she is feeling better and exercising some at home and wants to receive more exercise ideas.  She may want to come back for  more skilled visits and knows she will need another MD order, but wants to try the exercise on her own at home.    Pt will benefit from skilled therapeutic intervention in order to improve on the following deficits Pain;Decreased range of motion;Impaired UE functional use;Increased edema   Rehab Potential Good   Clinical Impairments Affecting Rehab Potential previous chemo and radiation therapy    PT Next Visit Plan no more schedules visits at this time   Consulted and Agree with Plan of Care Patient          G-Codes - 05-29-2015 1637    Functional Assessment Tool Used quick dash   Functional Limitation Carrying, moving and handling objects   Carrying, Moving and Handling Objects Current Status (L3810) At least 60 percent but less than 80 percent impaired, limited or restricted   Carrying, Moving and Handling Objects Goal Status (F7510) At least 20 percent but less than 40 percent impaired, limited or restricted   Carrying, Moving and Handling Objects Discharge Status [phone]) At least 60 percent but less than 80 percent impaired, limited or restricted      Problem List Patient Active Problem List   Diagnosis Date Noted  . Chronic pain 03/16/2015  . Rash 09/24/2014  . Vaginal bleeding 09/24/2014  . Postherpetic neuralgia 09/03/2014  . Lymphedema 09/03/2014  . Suspected herpes zoster left C5 distribution 08/14/2014  . Neuropathy due to chemotherapeutic drug ([name]) 03/03/2014  . Hand foot syndrome 03/03/2014  . Anxiety 03/03/2014  . Bilateral breast cancer ([place]) 08/11/2013  . Palpitation   . Chest pain   . Atrial septal defect   . Laboratory test 11/25/2011  . Chronic bronchitis   . Tobacco abuse    Donato Heinz. Owens Shark, PT  05/29/15, 4:39 PM  [place], [place], [address] Phone: [phone]   Fax:  [phone]

## 2015-05-12 ENCOUNTER — Telehealth: Payer: Self-pay | Admitting: Hematology and Oncology

## 2015-05-12 ENCOUNTER — Telehealth: Payer: Self-pay | Admitting: *Deleted

## 2015-05-12 ENCOUNTER — Ambulatory Visit
Admission: RE | Admit: 2015-05-12 | Discharge: 2015-05-12 | Disposition: A | Discharge status: Home / Self Care | Payer: Medicare Other | Source: Ambulatory Visit | Attending: General Surgery | Admitting: General Surgery

## 2015-05-12 ENCOUNTER — Other Ambulatory Visit: Payer: Self-pay | Admitting: General Surgery

## 2015-05-12 ENCOUNTER — Other Ambulatory Visit: Payer: Self-pay | Admitting: Hematology and Oncology

## 2015-05-12 ENCOUNTER — Other Ambulatory Visit: Payer: Self-pay | Admitting: *Deleted

## 2015-05-12 DIAGNOSIS — C50511 Malignant neoplasm of lower-outer quadrant of right female breast: Secondary | ICD-10-CM

## 2015-05-12 DIAGNOSIS — N6320 Unspecified lump in the left breast, unspecified quadrant: Secondary | ICD-10-CM

## 2015-05-12 DIAGNOSIS — C50512 Malignant neoplasm of lower-outer quadrant of left female breast: Principal | ICD-10-CM

## 2015-05-12 NOTE — Telephone Encounter (Signed)
Patient called to report that the MD who did her u/s told her to stop her Voltaren due to having a biopsy scheduled on the 18th. She wanted to have something else prescribed for pain. Spoke with Dr. Lindi Adie, advised patient that she could resume Voltaren and just not take it on the day of her biopsy. She verbalized understanding.

## 2015-05-12 NOTE — Telephone Encounter (Signed)
Pof/orders noted and central scheduling will call the patient

## 2015-05-14 ENCOUNTER — Telehealth: Payer: Self-pay

## 2015-05-14 NOTE — Telephone Encounter (Signed)
Paula Navarro has not heard from scheduling for CT Scan.   Spoke with Paula Navarro in [place].  She will contact [facility] for prior authorization now.  She should hear from Central scheduling by Monday with appointment. Told Paula Navarro to call back to Dr. Virgie Dad nurse if she has not heard from central scheduling by 1200 on Monday 05-17-15.  Paula Navarro verbalized understanding.

## 2015-05-16 ENCOUNTER — Encounter (HOSPITAL_COMMUNITY): Payer: Self-pay | Admitting: Emergency Medicine

## 2015-05-16 ENCOUNTER — Emergency Department (HOSPITAL_COMMUNITY)
Admission: EM | Admit: 2015-05-16 | Discharge: 2015-05-16 | Disposition: A | Discharge status: Home / Self Care | Payer: Medicare Other | Attending: Emergency Medicine | Admitting: Emergency Medicine

## 2015-05-16 DIAGNOSIS — Z862 Personal history of diseases of the blood and blood-forming organs and certain disorders involving the immune mechanism: Secondary | ICD-10-CM | POA: Insufficient documentation

## 2015-05-16 DIAGNOSIS — Z8774 Personal history of (corrected) congenital malformations of heart and circulatory system: Secondary | ICD-10-CM | POA: Diagnosis not present

## 2015-05-16 DIAGNOSIS — Z972 Presence of dental prosthetic device (complete) (partial): Secondary | ICD-10-CM | POA: Insufficient documentation

## 2015-05-16 DIAGNOSIS — Z9889 Other specified postprocedural states: Secondary | ICD-10-CM | POA: Diagnosis not present

## 2015-05-16 DIAGNOSIS — Z79899 Other long term (current) drug therapy: Secondary | ICD-10-CM | POA: Diagnosis not present

## 2015-05-16 DIAGNOSIS — G8929 Other chronic pain: Secondary | ICD-10-CM | POA: Diagnosis not present

## 2015-05-16 DIAGNOSIS — J449 Chronic obstructive pulmonary disease, unspecified: Secondary | ICD-10-CM | POA: Diagnosis not present

## 2015-05-16 DIAGNOSIS — Z87891 Personal history of nicotine dependence: Secondary | ICD-10-CM | POA: Insufficient documentation

## 2015-05-16 DIAGNOSIS — N63 Unspecified lump in breast: Secondary | ICD-10-CM | POA: Insufficient documentation

## 2015-05-16 DIAGNOSIS — F419 Anxiety disorder, unspecified: Secondary | ICD-10-CM | POA: Insufficient documentation

## 2015-05-16 DIAGNOSIS — N644 Mastodynia: Secondary | ICD-10-CM | POA: Diagnosis present

## 2015-05-16 DIAGNOSIS — Z853 Personal history of malignant neoplasm of breast: Secondary | ICD-10-CM | POA: Diagnosis not present

## 2015-05-16 DIAGNOSIS — N6320 Unspecified lump in the left breast, unspecified quadrant: Secondary | ICD-10-CM

## 2015-05-16 MED ORDER — OXYCODONE-ACETAMINOPHEN 5-325 MG PO TABS: ORAL_TABLET | ORAL | Status: DC

## 2015-05-16 NOTE — ED Notes (Signed)
Pt reports hx of bilateral breast cancer has recent mammogram and states breast were clean but pt has lump to left chest wall. Pt reports she has been having increasing pain for "a while" but the pain was worse last night.

## 2015-05-16 NOTE — ED Provider Notes (Signed)
CSN: [id number]     Arrival date & time 05/16/15  1432 History   First MD Initiated Contact with Patient 05/16/15 1513     Chief Complaint  Patient presents with  . Breast Pain      HPI Pt was seen at 1515. Per pt, c/o gradual onset and persistence of constant left breast "pain" for the past 2 to 3 weeks. Pt was dx with left breast mass, and states her Heme/Onc MD and General Surgeon have coordinated outpatient mammo, [place], CT scans, and biopsy of this mass (scheduled in 2 days). Pt states she came to the ED for "some pain medicine" because she had to stop taking her usual Voltaren several days ago because of her upcoming biopsy. Denies any other complaints.     Past Medical History  Diagnosis Date  . Chronic bronchitis   . Palpitation     Tachycardia reported by monitor clerk during a symptomatic spell  . Chest pain     Admitted to [facility] in 09/2011; refused stress test  . Atrial septal defect 1996    Surgical repair in 1996  . Tobacco abuse     60 pack years; 1.5 packs per day  . Anxiety   . Anemia   . Breast cancer ([place])   . Wears dentures     top  . COPD (chronic obstructive pulmonary disease) ([place])     on xray  . Chronic pain   . Radiation 06/30/14-08/17/14    Bilateral Breast   Past Surgical History  Procedure Laterality Date  . Cholecystectomy    . Cesarean section      x3  . Breast biopsy Bilateral   . Tubal ligation    . Asd repair, ostium primum  1996    dr Roxy Horseman  . Port a cath revision  1/15    put in   . Breast lumpectomy with radioactive seed localization Bilateral 04/08/2014    Procedure: BILATERAL  RADIOACTIVE SEED LOCALIZATION LUMPECTOMY ;  Surgeon: Autumn Messing III, MD;  Location: [facility];  Service: General;  Laterality: Bilateral;  . Axillary lymph node dissection Bilateral 04/08/2014    Procedure:  BILATERAL AXILLARY LYMPH NODE DISSECTION;  Surgeon: Autumn Messing III, MD;  Location: [facility];  Service: General;  Laterality:  Bilateral;  . Open heart surgery     Family History  Problem Relation Age of Onset  . Breast cancer Maternal Aunt    Social History  Substance Use Topics  . Smoking status: Former Smoker -- 1.50 packs/day for 40 years    Types: Cigarettes    Quit date: 08/01/2013  . Smokeless tobacco: Never Used  . Alcohol Use: No   OB History    Gravida Para Term Preterm AB TAB SAB Ectopic Multiple Living   '4 3 3            '$ Review of Systems ROS: Statement: All systems negative except as marked or noted in the HPI; Constitutional: Negative for fever and chills. ; ; Eyes: Negative for eye pain, redness and discharge. ; ; ENMT: Negative for ear pain, hoarseness, nasal congestion, sinus pressure and sore throat. ; ; Cardiovascular: Negative for chest pain, palpitations, diaphoresis, dyspnea and peripheral edema. ; ; Respiratory: Negative for cough, wheezing and stridor. ; ; Gastrointestinal: Negative for nausea, vomiting, diarrhea, abdominal pain, blood in stool, hematemesis, jaundice and rectal bleeding. . ; ; Genitourinary: Negative for dysuria, flank pain and hematuria. ; ; Musculoskeletal: +left breast pain. Negative  for back pain and neck pain. Negative for swelling and trauma.; ; Skin: Negative for pruritus, rash, abrasions, blisters, bruising and skin lesion.; ; Neuro: Negative for headache, lightheadedness and neck stiffness. Negative for weakness, altered level of consciousness , altered mental status, extremity weakness, paresthesias, involuntary movement, seizure and syncope.      Allergies  Codeine and Morphine and related  Home Medications   Prior to Admission medications   Medication Sig Start Date End Date Taking? Authorizing Provider  anastrozole (ARIMIDEX) 1 MG tablet Take 1 tablet (1 mg total) by mouth daily. 08/04/14  Yes Nicholas Lose, MD  B Complex-C (SUPER B COMPLEX PO) Take 1 tablet by mouth daily.   Yes Historical Provider, MD  gabapentin (NEURONTIN) 300 MG capsule TAKE 2 CAPSULES  (600 MG TOTAL) BY MOUTH 3 (THREE) TIMES DAILY. 12/09/14  Yes Nicholas Lose, MD  diclofenac (VOLTAREN) 50 MG EC tablet Take 1 tablet (50 mg total) by mouth 3 (three) times daily. Patient not taking: Reported on 05/16/2015 05/05/15   Nicholas Lose, MD  meloxicam (MOBIC) 15 MG tablet Take 1 tablet (15 mg total) by mouth daily. Patient not taking: Reported on 05/16/2015 04/26/15   Nicholas Lose, MD  oxyCODONE-acetaminophen (PERCOCET/ROXICET) 5-325 MG tablet Take 1 tablet by mouth every 8 (eight) hours as needed. Take 1 by mouth every 8 hrs as needed for severe pain 03/29/15   Historical Provider, MD   BP 147/87 mmHg  Pulse 85  Temp(Src) 97.4 F (36.3 C) (Oral)  Resp 18  Ht '5\' 2"'$  (1.575 m)  Wt 137 lb (62.143 kg)  BMI 25.05 kg/m2  SpO2 100%  LMP 05/20/2011 Physical Exam  1520: Physical examination:  Nursing notes reviewed; Vital signs and O2 SAT reviewed;  Constitutional: Well developed, Well nourished, Well hydrated, In no acute distress; Head:  Normocephalic, atraumatic; Eyes: EOMI, PERRL, No scleral icterus; ENMT: Mouth and pharynx normal, Mucous membranes moist; Neck: Supple, Full range of motion, No lymphadenopathy; Cardiovascular: Regular rate and rhythm, No gallop; Respiratory: Breath sounds clear & equal bilaterally, No wheezes.  Speaking full sentences with ease, Normal respiratory effort/excursion; Chest: +TTP mass at left upper chest wall, no rash, no deformity, no soft tissue crepitus. Movement normal; Abdomen: Soft, Nontender, Nondistended, Normal bowel sounds; Genitourinary: No CVA tenderness; Extremities: Pulses normal, No tenderness, No edema, No calf edema or asymmetry.; Neuro: AA&Ox3, Major CN grossly intact.  Speech clear. No gross focal motor or sensory deficits in extremities.; Skin: Color normal, Warm, Dry.   ED Course  Procedures (including critical care time) Labs Review   Imaging Review  I have personally reviewed and evaluated these images and lab results as part of my  medical decision-making.   EKG Interpretation None      MDM  MDM Reviewed: previous chart, nursing note and vitals Reviewed previous: ultrasound     1535:  Pt here for pain control only, states percocet has been effective previously. Aware I will rx only limited course; verb understanding. Encouraged to follow the care plan as per her Heme/Onc MD and General Surgeon; she is agreeable.    Francine Graven, DO 05/19/15 2231

## 2015-05-16 NOTE — Discharge Instructions (Signed)
°Emergency Department Resource Guide °1) Find a Doctor and Pay Out of Pocket °Although you won't have to find out who is covered by your insurance plan, it is a good idea to ask around and get recommendations. You will then need to call the office and see if the doctor you have chosen will accept you as a new patient and what types of options they offer for patients who are self-pay. Some doctors offer discounts or will set up payment plans for their patients who do not have insurance, but you will need to ask so you aren't surprised when you get to your appointment. ° °2) Contact Your Local Health Department °Not all health departments have doctors that can see patients for sick visits, but many do, so it is worth a call to see if yours does. If you don't know where your local health department is, you can check in your phone book. The CDC also has a tool to help you locate your state's health department, and many state websites also have listings of all of their local health departments. ° °3) Find a Walk-in Clinic °If your illness is not likely to be very severe or complicated, you may want to try a walk in clinic. These are popping up all over the country in pharmacies, drugstores, and shopping centers. They're usually staffed by nurse practitioners or physician assistants that have been trained to treat common illnesses and complaints. They're usually fairly quick and inexpensive. However, if you have serious medical issues or chronic medical problems, these are probably not your best option. ° °No Primary Care Doctor: °- Call Health Connect at  832-8000 - they can help you locate a primary care doctor that  accepts your insurance, provides certain services, etc. °- Physician Referral Service- 1-800-533-3463 ° °Chronic Pain Problems: °Organization         Address  Phone   Notes  °[facility]  ([phone]) [phone] Patients need to be referred by their primary care doctor.  ° °Medication  Assistance: °Organization         Address  Phone   Notes  °[place] [facility] [address] °[place], [place] [address] (336) 641-8030 --Must be a resident of [place] °-- Must have NO insurance coverage whatsoever (no Medicaid/ Medicare, etc.) °-- The pt. MUST have a primary care doctor that directs their care regularly and follows them in the community °  °[facility]  (866) 331-1348   °[employer]  (888) 892-1162   ° °Agencies that provide inexpensive medical care: °Organization         Address  Phone   Notes  °[facility]  (336) 832-8035   °[facility]    (336) 832-7272   °[facility] [address] °[place],  [address] (336) 832-4777   °[facility] [address], °[place] (336) 271-4999   °[facility]    (336) 373-0678   °[facility]    (336) 272-1050   °[facility] ° [address], [place] Phone:  (336) 832-4444, Fax:  (336) 832-4440 Hours of Operation:  9 am - 6 pm, M-F.  Also accepts Medicaid/Medicare and self-pay.  °[facility] ° [address],  Phone: (336) 832-3150, Fax: (336) [phone]. Hours of Operation:  8:30 am - 5:30 pm, M-F.  Also accepts Medicaid and self-pay.  °[facility] [place] [address], [place] Phone: (336) 878-6027   °[facility] [address], [place], [place] (336)723-1848, Ext. 123 Mondays & Thursdays: 7-9 AM.  First 15 patients are seen on a first come, first serve basis. °  ° °Medicaid-accepting [place] Providers: ° °Organization         Address  Phone   Notes  °[facility] [address], [place] (336) 641-2100 Also accepts self-pay patients.  °[facility] [address], [place] ° (336) [phone]   °[facility] [address], [place]  (336) 288-8857   °[facility] [address] [place] [address], [place] (336) 299-7000   °Veita Bland [address], [place]  ° (336) 373-1557 Only accepts [place] Access Medicaid patients after they have their name applied to their card.  ° °Self-Pay (no insurance) in [place]: ° °Organization         Address  Phone   Notes  °Sickle Cell Patients, [facility] [address], [place] (336) 832-1970   °[facility] [address], [place] (336) 832-4400   °[facility] ° [address], [place] (336) 992-4800   °[facility]/Dr. Osei-Bonsu ° [address] [place] [address], [place] or [address], [place] (336) [phone] Phone number for both [place] and [place] locations is the same.  °[facility] [address], [place] (336) 299-0000   °[facility] [address] [place] [address], [place] or [address] (336) 852-7530 °(336) 878-2260   °[facility] [address], [place] (336) 350-1642, phone; (336) 294-5005, fax Sees patients 1st and 3rd Saturday of every month.  Must not qualify for public or private insurance (i.e. Medicaid, Medicare, [place] Health Choice, Veterans' Benefits) • Household income should be no more than 200% of the poverty level •The clinic cannot treat you if you are pregnant or think you are pregnant • Sexually transmitted diseases are not treated at the clinic.  ° ° °Dental Care: °Organization         Address  Phone  Notes  °[place] [facility] [address],  (336) 641-6152 Accepts children up to age 21 who are enrolled in Medicaid or [place] Health Choice; pregnant women with a Medicaid card; and children who have applied for Medicaid or [place] Health Choice, but were declined, whose parents can pay a reduced fee at time of service.  °[place]  [facility] [place]  [address], [place] (336) 641-7733 Accepts children up to age 21 who are enrolled in Medicaid or [place] Health Choice; pregnant women with a Medicaid card; and children who have applied for Medicaid or [place] Health Choice, but were declined, whose parents can pay a reduced fee at time of service.  °[facility] ° [address],  (336) 641-4533 Patients are seen by appointment only. Walk-ins are not accepted. [facility] will see patients 18 years of age and older. °Monday - Tuesday (8am-5pm) °Most Wednesdays (8:30-5pm) °$30 per visit, cash only  °[facility] ° [address], [place] (336) 641-4533 Patients are seen by appointment only. Walk-ins are not accepted. [facility] will see patients 18 years of age and older. °One   Wednesday Evening (Monthly: Volunteer Based).  $30 per visit, cash only  °[facility]  (919) [phone] for adults; Children under age 4, call Graduate Pediatric Dentistry at ([phone]) [phone]. Children aged 4-14, please call ([phone]) [phone] to request a pediatric application. ° Dental services are provided in all areas of dental care including fillings, crowns and bridges, complete and partial dentures, implants, gum treatment, root canals, and extractions. Preventive care is also provided. Treatment is provided to both adults and children. °Patients are selected via a lottery and there is often a waiting list. °  °[facility] [address], °[place] ° (336) 763-8833 [email or web address] °  °[facility] [address], [place], [place] (336)723-1848, Ext. 123 Second and Fourth Thursday of each month, opens at 6:30 AM; Clinic ends at 9 AM.  Patients are seen on a first-come first-served basis, and a limited number are seen during each clinic.  ° °[facility] ° [address], [place], [place] (336) [phone]    Eligibility Requirements °You must have lived in [place], [place], or [place] counties for at least the last three months. °  You cannot be eligible for state or federal sponsored healthcare insurance, including Veterans Administration, Medicaid, or Medicare. °  You generally cannot be eligible for healthcare insurance through your employer.  °  How to apply: °Eligibility screenings are held every Tuesday and Wednesday afternoon from 1:00 pm until 4:00 pm. You do not need an appointment for the interview!  °[facility] [address], [place], [place] 336-631-2330   °[facility]  336-342-8273   °[place]  336-703-3100   °[facility]  336-570-6415   ° °Behavioral Health Resources in the Community: °Intensive Outpatient Programs °Organization         Address  Phone  Notes  °[facility] [address], [place], [place] 336-878-6098   °[facility] [address], [place], [place] 336-832-9800   °[facility] [address], [place], [place] ° 336-882-2125   °[facility] [address],  °[place], [place] 1-800-853-5163 or 336-641-4981   °Substance Abuse Resources °Organization         Address  Phone  Notes  °[facility]  336-882-2125   °[facility]  336-784-9470   °[facility]  336-285-9073   °[facility]  336-845-3988   °Residential & Outpatient Substance Abuse Program  1-800-659-3381   °Psychological Services °Organization         Address  Phone  Notes  °[facility]  336- 832-9600   °[facility]  336- 378-7881   °[facility] [address], [place] 1-800-853-5163 or 336-641-4981   ° °Mobile Crisis Teams °Organization         Address  Phone  Notes  °[facility], Mobile Crisis Care Unit  1-877-626-1772   °Assertive °[facility] ° [address]  [place], [place] 336-834-9664   °Sharon DeEsch [address] °[place] 336-554-5454   ° °Self-Help/Support Groups °Organization         Address  Phone             Notes  °[facility] - variety of support groups  336- 373-1402 Call for more information  °Narcotics Anonymous (NA), [facility] [address], °[place]  2 meetings at this location  ° °  Residential Treatment Programs Organization         Address  Phone  Notes  [facility] [address]    [place]  1-269-043-3045   [facility]  [address] [place] [address], [place], [place]   [place], [place] [phone] Admissions: 8am-3pm M-F  [facility] [place] [address]    [place], [place] 388-828-0034   [facility] [address] [place], [place], [place]   [facility] [address]  [place], [place]   [facility] Intensive Outpatient [address], [place], [place]   [facility] ([place] [address]  [place], [place] 1-360-217-6174 or 339-796-2185   [facility] [address], [place], [place] Accepts Medicaid  [facility] [address]  [place] 1-814-254-3285 Substance Abuse/Addiction Treatment   [facility] Organization         Address  Phone  Notes  [facility]  450-145-3362   Domenic Schwab, PhD [address] [place], [place]   509-162-5766 or 3602973288   [facility] [place], [place] [phone]   [facility] [address] [place], [place] [phone] Insurance/Medicaid/sponsorship through [facility] and Families [address]                                    [place], [place] [phone] [place] [address], [place] [phone]    Dr. Adele Schilder  [phone]   [facility] Dept. 1) [address], [place] 2) [place] 3)  [place] [address], [place] [phone]  [phone]   [place] [phone] or [phone] (After Hours)      Take the prescription as directed.  Call your regular Oncologist tomorrow to schedule a follow up appointment within the next 1 to 2 days.  Return to the Emergency Department immediately sooner if worsening.

## 2015-05-17 ENCOUNTER — Telehealth: Payer: Self-pay | Admitting: *Deleted

## 2015-05-17 ENCOUNTER — Other Ambulatory Visit: Payer: Self-pay | Admitting: Obstetrics and Gynecology

## 2015-05-17 NOTE — Telephone Encounter (Signed)
Patient called to know her status about her CT appointment. Patient is approved for the CT abdomen/chest. Paient notified and given a patient call number to set up her appointment. Patient states "I have seen this number pop up on my cell phone, but I didn't answer it because I didn't know the number."

## 2015-05-18 ENCOUNTER — Telehealth: Payer: Self-pay | Admitting: Hematology and Oncology

## 2015-05-18 ENCOUNTER — Other Ambulatory Visit: Payer: Self-pay

## 2015-05-18 ENCOUNTER — Ambulatory Visit: Payer: Medicare Other | Admitting: Hematology and Oncology

## 2015-05-18 ENCOUNTER — Other Ambulatory Visit: Payer: Medicare Other

## 2015-05-18 ENCOUNTER — Encounter (HOSPITAL_COMMUNITY): Payer: Self-pay | Admitting: Anesthesiology

## 2015-05-18 DIAGNOSIS — C50512 Malignant neoplasm of lower-outer quadrant of left female breast: Principal | ICD-10-CM

## 2015-05-18 DIAGNOSIS — C50511 Malignant neoplasm of lower-outer quadrant of right female breast: Secondary | ICD-10-CM

## 2015-05-18 NOTE — Progress Notes (Signed)
CT scan rescheduled for 06/08/15 at 1030.  Lab 06/07/15 - pof sent for lab.  Pt notified of d/t.  Voiced understanding.

## 2015-05-18 NOTE — Telephone Encounter (Signed)
Was able to reach patient and advise of a needed lab appointment.  She states that she needs to move out this appointment due to her mother in icu and that her mother comes before her. She has also moved her bx appointment.  She states that she will do the scan if we really need her too which i advised he must as it has been scheduled for her.  She request to speak with the nurse so i have trnasferred her call and will follow up with terri f.

## 2015-05-18 NOTE — Telephone Encounter (Signed)
Called patient for lab and her phone has no voicemail set up

## 2015-05-19 ENCOUNTER — Telehealth: Payer: Self-pay | Admitting: Hematology and Oncology

## 2015-05-19 ENCOUNTER — Ambulatory Visit (HOSPITAL_COMMUNITY): Payer: Medicare Other

## 2015-05-19 NOTE — Telephone Encounter (Signed)
Appointments made and calendar printed and mailed to patient

## 2015-05-24 ENCOUNTER — Other Ambulatory Visit (HOSPITAL_COMMUNITY): Payer: Medicare Other

## 2015-05-24 ENCOUNTER — Inpatient Hospital Stay: Admission: RE | Admit: 2015-05-24 | Payer: Medicare Other | Source: Ambulatory Visit

## 2015-05-24 ENCOUNTER — Other Ambulatory Visit: Payer: Self-pay | Admitting: General Surgery

## 2015-05-24 DIAGNOSIS — N6320 Unspecified lump in the left breast, unspecified quadrant: Secondary | ICD-10-CM

## 2015-05-25 ENCOUNTER — Ambulatory Visit (HOSPITAL_COMMUNITY): Admit: 2015-05-25 | Payer: Medicare Other | Admitting: Obstetrics and Gynecology

## 2015-05-25 ENCOUNTER — Encounter (HOSPITAL_COMMUNITY): Payer: Self-pay

## 2015-05-25 SURGERY — DILATATION & CURETTAGE/HYSTEROSCOPY WITH RESECTOCOPE
Anesthesia: Choice

## 2015-05-28 ENCOUNTER — Encounter (HOSPITAL_COMMUNITY): Payer: Self-pay

## 2015-05-28 ENCOUNTER — Other Ambulatory Visit (HOSPITAL_BASED_OUTPATIENT_CLINIC_OR_DEPARTMENT_OTHER): Payer: Medicare Other

## 2015-05-28 ENCOUNTER — Ambulatory Visit (HOSPITAL_COMMUNITY)
Admission: RE | Admit: 2015-05-28 | Discharge: 2015-05-28 | Disposition: A | Discharge status: Home / Self Care | Payer: Medicare Other | Source: Ambulatory Visit | Attending: Hematology and Oncology | Admitting: Hematology and Oncology

## 2015-05-28 DIAGNOSIS — R590 Localized enlarged lymph nodes: Secondary | ICD-10-CM | POA: Insufficient documentation

## 2015-05-28 DIAGNOSIS — C50912 Malignant neoplasm of unspecified site of left female breast: Secondary | ICD-10-CM | POA: Diagnosis not present

## 2015-05-28 DIAGNOSIS — R918 Other nonspecific abnormal finding of lung field: Secondary | ICD-10-CM | POA: Insufficient documentation

## 2015-05-28 DIAGNOSIS — C50911 Malignant neoplasm of unspecified site of right female breast: Secondary | ICD-10-CM | POA: Diagnosis not present

## 2015-05-28 DIAGNOSIS — Z853 Personal history of malignant neoplasm of breast: Secondary | ICD-10-CM | POA: Insufficient documentation

## 2015-05-28 DIAGNOSIS — N63 Unspecified lump in breast: Secondary | ICD-10-CM | POA: Insufficient documentation

## 2015-05-28 DIAGNOSIS — C50512 Malignant neoplasm of lower-outer quadrant of left female breast: Secondary | ICD-10-CM

## 2015-05-28 DIAGNOSIS — C50511 Malignant neoplasm of lower-outer quadrant of right female breast: Secondary | ICD-10-CM

## 2015-05-28 DIAGNOSIS — Z08 Encounter for follow-up examination after completed treatment for malignant neoplasm: Secondary | ICD-10-CM | POA: Insufficient documentation

## 2015-05-28 LAB — CBC WITH DIFFERENTIAL/PLATELET
BASO%: 0.5 % (ref 0.0–2.0)
BASOS ABS: 0 10*3/uL (ref 0.0–0.1)
EOS ABS: 0.3 10*3/uL (ref 0.0–0.5)
EOS%: 3.2 % (ref 0.0–7.0)
HCT: 38.3 % (ref 34.8–46.6)
HEMOGLOBIN: 12.2 g/dL (ref 11.6–15.9)
LYMPH#: 2.3 10*3/uL (ref 0.9–3.3)
LYMPH%: 29.5 % (ref 14.0–49.7)
MCH: 23.1 pg — AB (ref 25.1–34.0)
MCHC: 31.9 g/dL (ref 31.5–36.0)
MCV: 72.5 fL — AB (ref 79.5–101.0)
MONO#: 0.5 10*3/uL (ref 0.1–0.9)
MONO%: 6.2 % (ref 0.0–14.0)
NEUT#: 4.7 10*3/uL (ref 1.5–6.5)
NEUT%: 60.6 % (ref 38.4–76.8)
NRBC: 0 % (ref 0–0)
PLATELETS: 290 10*3/uL (ref 145–400)
RBC: 5.28 10*6/uL (ref 3.70–5.45)
RDW: 14.5 % (ref 11.2–14.5)
WBC: 7.8 10*3/uL (ref 3.9–10.3)

## 2015-05-28 LAB — COMPREHENSIVE METABOLIC PANEL (CC13)
ALBUMIN: 3.8 g/dL (ref 3.5–5.0)
ALK PHOS: 68 U/L (ref 40–150)
ALT: 32 U/L (ref 0–55)
ANION GAP: 9 meq/L (ref 3–11)
AST: 33 U/L (ref 5–34)
BILIRUBIN TOTAL: 0.87 mg/dL (ref 0.20–1.20)
BUN: 15.5 mg/dL (ref 7.0–26.0)
CO2: 26 mEq/L (ref 22–29)
Calcium: 10 mg/dL (ref 8.4–10.4)
Chloride: 106 mEq/L (ref 98–109)
Creatinine: 0.8 mg/dL (ref 0.6–1.1)
EGFR: 89 mL/min/{1.73_m2} — AB (ref >90)
GLUCOSE: 135 mg/dL (ref 70–140)
POTASSIUM: 4 meq/L (ref 3.5–5.1)
SODIUM: 141 meq/L (ref 136–145)
Total Protein: 7.2 g/dL (ref 6.4–8.3)

## 2015-05-28 MED ORDER — IOHEXOL 300 MG/ML  SOLN
100.0000 mL | Freq: Once | INTRAMUSCULAR | Status: AC | PRN
  Administered 2015-05-28: 100 mL via INTRAVENOUS

## 2015-05-31 ENCOUNTER — Other Ambulatory Visit: Payer: Self-pay | Admitting: Hematology and Oncology

## 2015-06-08 ENCOUNTER — Other Ambulatory Visit: Payer: Medicare Other

## 2015-06-08 ENCOUNTER — Ambulatory Visit (HOSPITAL_COMMUNITY): Payer: Medicare Other

## 2015-06-12 ENCOUNTER — Other Ambulatory Visit: Payer: Self-pay | Admitting: Hematology and Oncology

## 2015-06-14 ENCOUNTER — Other Ambulatory Visit: Payer: Self-pay | Admitting: Hematology and Oncology

## 2015-06-14 ENCOUNTER — Telehealth: Payer: Self-pay | Admitting: Hematology and Oncology

## 2015-06-14 ENCOUNTER — Other Ambulatory Visit: Payer: Self-pay | Admitting: *Deleted

## 2015-06-14 ENCOUNTER — Ambulatory Visit (HOSPITAL_BASED_OUTPATIENT_CLINIC_OR_DEPARTMENT_OTHER): Payer: Medicare Other | Admitting: Hematology and Oncology

## 2015-06-14 ENCOUNTER — Encounter: Payer: Self-pay | Admitting: Hematology and Oncology

## 2015-06-14 VITALS — BP 133/83 | HR 84 | Temp 97.8°F | Resp 18 | Ht 64.0 in | Wt 139.5 lb

## 2015-06-14 DIAGNOSIS — R0789 Other chest pain: Secondary | ICD-10-CM

## 2015-06-14 DIAGNOSIS — C50512 Malignant neoplasm of lower-outer quadrant of left female breast: Principal | ICD-10-CM

## 2015-06-14 DIAGNOSIS — C50911 Malignant neoplasm of unspecified site of right female breast: Secondary | ICD-10-CM | POA: Diagnosis not present

## 2015-06-14 DIAGNOSIS — C50912 Malignant neoplasm of unspecified site of left female breast: Secondary | ICD-10-CM

## 2015-06-14 DIAGNOSIS — C50511 Malignant neoplasm of lower-outer quadrant of right female breast: Secondary | ICD-10-CM

## 2015-06-14 DIAGNOSIS — C50519 Malignant neoplasm of lower-outer quadrant of unspecified female breast: Secondary | ICD-10-CM

## 2015-06-14 MED ORDER — OXYCODONE-ACETAMINOPHEN 5-325 MG PO TABS: ORAL_TABLET | ORAL | Status: DC

## 2015-06-14 MED ORDER — GABAPENTIN 300 MG PO CAPS: ORAL_CAPSULE | ORAL | Status: DC

## 2015-06-14 NOTE — Progress Notes (Signed)
Patient Care Team: [name], MD as PCP - General (Family Medicine) [name], MD (Cardiology)  DIAGNOSIS: Bilateral breast cancer [facility])   Staging form: Breast, AJCC 7th Edition     Clinical: Stage IIB (T2, N1, cM0) - Unsigned       Staging comments: Staged at breast conference 08/13/13      Pathologic: No stage assigned - Unsigned   SUMMARY OF ONCOLOGIC HISTORY:   Bilateral breast cancer ([place])   07/23/2013 Mammogram Bilateral breast masses. 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 N1 M0 stage IIB ER 100%, PR 0%, HER-2    06/17/2014 -  Radiation Therapy Adjuvant radiation therapy   09/14/2014 -  Anti-estrogen oral therapy Anastrozole 1 mg daily   05/28/2015 Imaging CT scans: Enlarging subpectoral masses 3.1 x 3.5 cm, posteriorly lower density mass 4.5 x 2.1 cm, several right-sided lung nodules right lower lobe 1.4 cm, 3 other right lung nodules, 1.7 cm right iliac bone lesion    CHIEF COMPLIANT: Left chest wall pain  INTERVAL HISTORY: Paula Navarro is a 64 year old with above-mentioned history of bilateral breast cancer history with bilateral lumpectomies after receiving neoadjuvant chemotherapy. She also had bilateral radiation and has been on oral anastrozole since February 2016. She was complaining of left chest wall pain and we obtain a CT of the chest and she is here today to discuss the results. CT scan showed right subpectoral masses as well as for lung nodules the largest of which measured 1.4 cm. There was also a 1.7 cm right iliac lesion. All of this is concerning for metastatic disease. She is here to discuss the scans as well as to discuss treatment plan.  REVIEW OF SYSTEMS:   Constitutional: Denies fevers, chills or abnormal weight loss Eyes: Denies blurriness of vision Ears, nose, mouth, throat, and face: Denies mucositis or sore throat Respiratory: Denies cough, dyspnea or wheezes Cardiovascular: Denies palpitation, chest discomfort or lower extremity swelling Gastrointestinal:  Denies nausea, heartburn or change in bowel habits Skin: Denies abnormal skin rashes Lymphatics: Denies new lymphadenopathy or easy bruising Neurological: Neuropathy Behavioral/Psych: Mood is stable, no new changes  Breast: Left chest wall swelling and pain All other systems were reviewed with the patient and are negative.  I have reviewed the past medical history, past surgical history, social history and family history with the patient and they are unchanged from previous note.  ALLERGIES:  is allergic to codeine and morphine and related.  MEDICATIONS:  Current Outpatient Prescriptions  Medication Sig Dispense Refill  .  anastrozole (ARIMIDEX) 1 MG tablet Take 1 tablet (1 mg total) by mouth daily. 90 tablet 6  . B Complex-C (SUPER B COMPLEX PO) Take 1 tablet by mouth daily.    Marland Kitchen gabapentin (NEURONTIN) 300 MG capsule TAKE 2 CAPSULES (600 MG TOTAL) BY MOUTH 3 (THREE) TIMES DAILY. 180 capsule 5  . oxyCODONE-acetaminophen (PERCOCET/ROXICET)  5-325 MG tablet 1 tabs PO q6h prn pain 60 tablet 0   No current facility-administered medications for this visit.    PHYSICAL EXAMINATION: ECOG PERFORMANCE STATUS: 1 - Symptomatic but completely ambulatory  Filed Vitals:   06/14/15 1502  BP: 133/83  Pulse: 84  Temp: 97.8 F (36.6 C)  Resp: 18   Filed Weights   06/14/15 1502  Weight: 139 lb 8 oz (63.277 kg)    GENERAL:alert, no distress and comfortable SKIN: skin color, texture, turgor are normal, no rashes or significant lesions EYES: normal, Conjunctiva are pink and non-injected, sclera clear OROPHARYNX:no exudate, no erythema and lips, buccal mucosa, and tongue normal  NECK: supple, thyroid normal size, non-tender, without nodularity LYMPH:  no palpable lymphadenopathy in the cervical, axillary or inguinal LUNGS: clear to auscultation and percussion with normal breathing effort HEART: regular rate & rhythm and no murmurs and no lower extremity edema ABDOMEN:abdomen soft, non-tender and normal bowel sounds Musculoskeletal:no cyanosis of digits and no clubbing  NEURO: alert & oriented x 3 with fluent speech, no focal motor/sensory deficits BREAST: Marker palpable distention of the left chest wall compatible with a right with tenderness to palpation. (exam performed in the presence of a chaperone)  LABORATORY DATA:  I have reviewed the data as listed   Chemistry      Component Value Date/Time   NA 141 05/28/2015 0823   NA 133* 01/07/2015 1409   K 4.0 05/28/2015 0823   K 4.3 01/07/2015 1409   CL 95* 01/07/2015 1409   CO2 26 05/28/2015 0823   CO2 26 01/07/2015 1409   BUN 15.5 05/28/2015 0823   BUN 10 01/07/2015 1409   CREATININE 0.8 05/28/2015 0823   CREATININE 0.79 01/07/2015 1409      Component Value Date/Time   CALCIUM 10.0 05/28/2015 0823   CALCIUM 9.3 01/07/2015 1409   ALKPHOS 68 05/28/2015 0823   AST 33 05/28/2015 0823   ALT 32 05/28/2015 0823   BILITOT 0.87 05/28/2015 0823       Lab Results    Component Value Date   WBC 7.8 05/28/2015   HGB 12.2 05/28/2015   HCT 38.3 05/28/2015   MCV 72.5* 05/28/2015   PLT 290 05/28/2015   NEUTROABS 4.7 05/28/2015   ASSESSMENT & PLAN:   Bilateral breast cancers: Treated with neoadjuvant chemotherapy followed by surgery 04/08/2014, radiation treatment completed 07/17/2014, antiestrogen therapy with anastrozole 09/14/2014 Left breast T2, N0, M0, IDC grade 3; 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 N1 M0 stage IIB  Radiology review CT chest abdomen pelvis 05/28/2015: Developing subpectoral masses 3.5 cm and 4.5 cm for lung nodules the largest 1.4 cm in the right lung 1.7 cm sclerotic right iliac bone lesion concerning for metastatic disease  Treatment plan: 1. US-guided biopsy of the subpectoral masses 2. Follow-up after the biopsy to discuss treatment plan Options include Ibrance with letrozole  Left chest wall pain: Related to these tumors under the pectoralis muscle. I will discuss with  Dr. Marlou Starks regarding any surgical options. Since she had bilateral radiation therapy, there may not be any role for additional radiation but I will discuss that with her radiation oncologist as well if it is proven that this is metastatic breast cancer.  For pain relief I will start her on Percocets as these are the only pills that appeared to be working for her. I will discontinue diclofenac and mobic.   Orders Placed This Encounter  Procedures  . [place] LT BREAST BX W LOC DEV 1ST LESION IMG BX SPEC US GUIDE    Core//no blood thinner/pf 05/12/15 bcg/amh,[place] [phone] Ins-aarp mc comp    Standing Status: Future     Number of Occurrences:      Standing Expiration Date: 08/13/2016    Order Specific Question:  Reason for Exam (SYMPTOM  OR DIAGNOSIS REQUIRED)    Answer:  left progressive subpectoral  adenopathy    Order Specific Question:  Preferred imaging location?    Answer:  [facility]   The patient has a good understanding of the overall plan. she agrees with it. she will call with any problems that may develop before the next visit here.   Rulon Eisenmenger, MD 06/14/2015

## 2015-06-14 NOTE — Telephone Encounter (Signed)
Call received to TRIAGE from [place] stating pt " has not heard results from recent CT scan."  Request will be sent to MD and nurse for appropriate follow up.

## 2015-06-14 NOTE — Telephone Encounter (Signed)
Appointments made and patient aware of her 11/28

## 2015-06-14 NOTE — Assessment & Plan Note (Signed)
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 N1 M0 stage IIB  Radiology review CT chest abdomen pelvis 05/28/2015: Developing subpectoral masses 3.5 cm and 4.5 cm for lung nodules the largest 1.4 cm in the right lung 1.7 cm sclerotic right iliac bone lesion concerning for metastatic disease   Treatment plan: 1. US-guided biopsy of the subpectoral masses 2. Follow-up after the biopsy to discuss treatment plan Options include Ibrance with letrozole

## 2015-06-14 NOTE — Addendum Note (Signed)
Addended by: Prentiss Bells on: 06/14/2015 05:27 PM   Modules accepted: Medications

## 2015-06-16 ENCOUNTER — Telehealth: Payer: Self-pay | Admitting: *Deleted

## 2015-06-16 ENCOUNTER — Telehealth: Payer: Self-pay

## 2015-06-16 NOTE — Telephone Encounter (Signed)
VM message from patient stating that the new pain med she has (percocet 5/325) as of 06/14/15 is making her feel sick to her stomach and wants something else.

## 2015-06-16 NOTE — Telephone Encounter (Signed)
Gave pt appt date time and location for biopsy.  Pt voiced understanding.

## 2015-06-17 NOTE — Telephone Encounter (Signed)
Recording "caller not available" - no voice mail.  Attempted to return pt call re: pain med.    Per Dr. Lindi Adie, pt needs to take food prior to the medication and may take 1/2 pill at a time to reduce stomach upset.

## 2015-06-18 ENCOUNTER — Telehealth: Payer: Self-pay | Admitting: Hematology and Oncology

## 2015-06-18 ENCOUNTER — Ambulatory Visit: Payer: Medicare Other | Admitting: Hematology and Oncology

## 2015-06-18 ENCOUNTER — Other Ambulatory Visit: Payer: Medicare Other

## 2015-06-18 NOTE — Telephone Encounter (Signed)
Patient called in to reschedule her appointment

## 2015-06-21 ENCOUNTER — Ambulatory Visit
Admission: RE | Admit: 2015-06-21 | Discharge: 2015-06-21 | Disposition: A | Discharge status: Home / Self Care | Payer: Medicare Other | Source: Ambulatory Visit | Attending: Hematology and Oncology | Admitting: Hematology and Oncology

## 2015-06-21 ENCOUNTER — Ambulatory Visit
Admission: RE | Admit: 2015-06-21 | Discharge: 2015-06-21 | Disposition: A | Discharge status: Home / Self Care | Payer: Medicare Other | Source: Ambulatory Visit | Attending: General Surgery | Admitting: General Surgery

## 2015-06-21 DIAGNOSIS — C50512 Malignant neoplasm of lower-outer quadrant of left female breast: Principal | ICD-10-CM

## 2015-06-21 DIAGNOSIS — C50511 Malignant neoplasm of lower-outer quadrant of right female breast: Secondary | ICD-10-CM

## 2015-06-21 DIAGNOSIS — N6320 Unspecified lump in the left breast, unspecified quadrant: Secondary | ICD-10-CM

## 2015-06-22 NOTE — Assessment & Plan Note (Signed)
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 N1 M0 stage IIB  Radiology review CT chest abdomen pelvis 05/28/2015: Developing subpectoral masses 3.5 cm and 4.5 cm for lung nodules the largest 1.4 cm in the right lung 1.7 cm sclerotic right iliac bone lesion concerning for metastatic disease  Treatment plan: 1. US-guided biopsy of the subpectoral masses 2. Follow-up after the biopsy to discuss treatment plan Options include Ibrance with letrozole  Left chest wall pain:

## 2015-06-23 ENCOUNTER — Ambulatory Visit (HOSPITAL_BASED_OUTPATIENT_CLINIC_OR_DEPARTMENT_OTHER): Payer: Medicare Other | Admitting: Hematology and Oncology

## 2015-06-23 ENCOUNTER — Telehealth: Payer: Self-pay | Admitting: Hematology and Oncology

## 2015-06-23 ENCOUNTER — Encounter: Payer: Self-pay | Admitting: Hematology and Oncology

## 2015-06-23 VITALS — BP 139/80 | HR 82 | Temp 98.8°F | Resp 18 | Ht 64.0 in | Wt 137.9 lb

## 2015-06-23 DIAGNOSIS — C50912 Malignant neoplasm of unspecified site of left female breast: Secondary | ICD-10-CM

## 2015-06-23 DIAGNOSIS — C50911 Malignant neoplasm of unspecified site of right female breast: Secondary | ICD-10-CM | POA: Diagnosis not present

## 2015-06-23 DIAGNOSIS — C50511 Malignant neoplasm of lower-outer quadrant of right female breast: Secondary | ICD-10-CM

## 2015-06-23 DIAGNOSIS — C50512 Malignant neoplasm of lower-outer quadrant of left female breast: Principal | ICD-10-CM

## 2015-06-23 NOTE — Progress Notes (Signed)
Patient Care Team: [name], MD as PCP - General (Family Medicine) [name], MD (Cardiology)  DIAGNOSIS: Bilateral breast cancer [facility])   Staging form: Breast, AJCC 7th Edition     Clinical: Stage IIB (T2, N1, cM0) - Unsigned       Staging comments: Staged at breast conference 08/13/13      Pathologic: No stage assigned - Unsigned   SUMMARY OF ONCOLOGIC HISTORY:   Bilateral breast cancer ([place])   07/23/2013 Mammogram Bilateral breast masses. 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 N1 M0 stage IIB ER 100%, PR 0%, HER-2    06/17/2014 -  Radiation Therapy Adjuvant radiation therapy   09/14/2014 -  Anti-estrogen oral therapy Anastrozole 1 mg daily   05/28/2015 Imaging CT scans: Enlarging subpectoral masses 3.1 x 3.5 cm, posteriorly lower density mass 4.5 x 2.1 cm, several right-sided lung nodules right lower lobe 1.4 cm, 3 other right lung nodules, 1.7 cm right iliac bone lesion    CHIEF COMPLIANT: Follow-up to discuss biopsy  INTERVAL HISTORY: LAURENCIA ROMA is a 64 year old with above-mentioned history of bilateral breast cancer who blood neoadjuvant chemotherapy followed by bilateral lumpectomies and radiation and hormone therapy with anastrozole started February 2016. She is complaining of chest wall pain and we obtained a CT scan that revealed several subpectoral masses as well as several lung nodules in the right iliac lesion. She underwent biopsy and is seeing [place] to discuss the results.  REVIEW OF SYSTEMS:   Constitutional: Denies fevers, chills or abnormal weight loss Eyes: Denies blurriness of vision Ears, nose, mouth, throat, and face: Denies mucositis or sore throat Respiratory: Denies cough, dyspnea or wheezes Cardiovascular: Denies palpitation, chest discomfort or lower extremity swelling Gastrointestinal:  Denies nausea, heartburn or change in bowel habits Skin: Denies abnormal skin rashes Lymphatics: Denies new lymphadenopathy or easy bruising Neurological:Denies numbness, tingling or new weaknesses Behavioral/Psych: Mood is stable, no new changes  Breast:  denies any pain or lumps or nodules in either breasts All other systems were reviewed with the patient and are negative.  I have reviewed the past medical history, past surgical history, social history and family history with the patient and they are unchanged from previous note.  ALLERGIES:  is allergic to codeine and morphine and related.  MEDICATIONS:  Current Outpatient Prescriptions  Medication Sig Dispense Refill  . anastrozole (ARIMIDEX) 1 MG tablet Take 1 tablet (1 mg total) by mouth daily. 90 tablet 6  . B Complex-C (SUPER B COMPLEX PO) Take 1 tablet by mouth  daily.    . gabapentin (NEURONTIN) 300 MG capsule TAKE 2 CAPSULES (600 MG TOTAL) BY MOUTH 3 (THREE) TIMES DAILY. 180 capsule 5  . oxyCODONE-acetaminophen (PERCOCET/ROXICET) 5-325 MG tablet 1 tabs PO q6h prn pain 60 tablet 0   No current facility-administered medications for this visit.    PHYSICAL  EXAMINATION: ECOG PERFORMANCE STATUS: 1 - Symptomatic but completely ambulatory  Filed Vitals:   06/23/15 0918  BP: 139/80  Pulse: 82  Temp: 98.8 F (37.1 C)  Resp: 18   Filed Weights   06/23/15 0918  Weight: 137 lb 14.4 oz (62.551 kg)    GENERAL:alert, no distress and comfortable SKIN: skin color, texture, turgor are normal, no rashes or significant lesions EYES: normal, Conjunctiva are pink and non-injected, sclera clear OROPHARYNX:no exudate, no erythema and lips, buccal mucosa, and tongue normal  NECK: supple, thyroid normal size, non-tender, without nodularity LYMPH:  no palpable lymphadenopathy in the cervical, axillary or inguinal LUNGS: clear to auscultation and percussion with normal breathing effort, chest wall pain HEART: regular rate & rhythm and no murmurs and no lower extremity edema ABDOMEN:abdomen soft, non-tender and normal bowel sounds Musculoskeletal:no cyanosis of digits and no clubbing  NEURO: alert & oriented x 3 with fluent speech, no focal motor/sensory deficits   LABORATORY DATA:  I have reviewed the data as listed   Chemistry      Component Value Date/Time   NA 141 05/28/2015 0823   NA 133* 01/07/2015 1409   K 4.0 05/28/2015 0823   K 4.3 01/07/2015 1409   CL 95* 01/07/2015 1409   CO2 26 05/28/2015 0823   CO2 26 01/07/2015 1409   BUN 15.5 05/28/2015 0823   BUN 10 01/07/2015 1409   CREATININE 0.8 05/28/2015 0823   CREATININE 0.79 01/07/2015 1409      Component Value Date/Time   CALCIUM 10.0 05/28/2015 0823   CALCIUM 9.3 01/07/2015 1409   ALKPHOS 68 05/28/2015 0823   AST 33 05/28/2015 0823   ALT 32 05/28/2015 0823   BILITOT 0.87 05/28/2015 0823       Lab Results  Component Value Date   WBC 7.8 05/28/2015   HGB 12.2 05/28/2015   HCT 38.3 05/28/2015   MCV 72.5* 05/28/2015   PLT 290 05/28/2015   NEUTROABS 4.7 05/28/2015     RADIOGRAPHIC STUDIES: I have personally reviewed the radiology reports and agreed with their findings. [place]  Lt Breast Bx W Loc Dev 1st Lesion Img Bx Spec US Guide  06/21/2015  CLINICAL DATA:  History of bilateral breast cancers in 2014 status post lumpectomies, chemotherapy and radiation therapy. Patient presents today for ultrasound-guided biopsy of a left chest subpectoral mass identified on previous ultrasound of 05/12/2015 and chest CT dated 05/28/2015. EXAM: ULTRASOUND GUIDED LEFT BREAST CORE NEEDLE BIOPSY COMPARISON:  Previous exam(s). PROCEDURE: I met with the patient and we discussed the procedure of ultrasound-guided biopsy, including benefits and alternatives. We discussed the high likelihood of a successful procedure. We discussed the risks of the procedure including infection, bleeding, tissue injury, clip migration, and inadequate sampling. Informed written consent was given. The usual time-out protocol was performed immediately prior to the procedure. Using sterile technique and 2% Lidocaine as local anesthetic, under direct ultrasound visualization, a 12 gauge vacuum-assisted device was used to perform biopsy of the left-sided subpectoral mass, 12 o'clock axis region,using a lateral approach. At the conclusion of the procedure, a ribbon shaped tissue marker clip was deployed into the biopsy cavity. Due to its subpectoral position, a postprocedure  mammogram was not obtained. IMPRESSION: Ultrasound-guided biopsy of the left-sided subpectoral mass, 12 o'clock axis region, using a lateral approach. No apparent complications. Electronically Signed   By: Franki Cabot M.D.   On: 06/21/2015 11:56     ASSESSMENT & PLAN:  Bilateral breast cancer ([place]) Bilateral breast cancers: Treated with neoadjuvant chemotherapy followed by surgery 04/08/2014, radiation treatment completed 07/17/2014, antiestrogen therapy with anastrozole 09/14/2014 Left breast T2, N0, M0, IDC grade 3; 2.1 cm with high-grade DCIS 0/16 LN, ER 6%, PR 0%, HER-2 negative Right breast invasive ductal carcinoma grade 3; 1.8 cm with high-grade  DCIS 1/11 lymph nodes positive ER 100% PR 0% HER-2 negative T1 C. N1 M0 stage IIB  Radiology review CT chest abdomen pelvis 05/28/2015: Developing subpectoral masses 3.5 cm and 4.5 cm for lung nodules the largest 1.4 cm in the right lung 1.7 cm sclerotic right iliac bone lesion concerning for metastatic disease  Treatment plan: 1. US-guided biopsy of the subpectoral masses done 06/21/2015 2. Follow-up next used a to discuss treatment plan Options include Ibrance with letrozole versus chemotherapy  Left chest wall pain: Much improved on pain medication. She could not tolerate a full tablet of Percocet and she is not taking half a tablet. Biopsy has been performed and results are still not available. The preliminary suggests that this is a metastatic breast cancer. We do not have the ER/PR HER-2 testing.  We do not have the rest of the results available so we will plan on seeing her next Tuesday to go over the results and come up with the treatment plan.  No orders of the defined types were placed in this encounter.   The patient has a good understanding of the overall plan. she agrees with it. she will call with any problems that may develop before the next visit here.   Rulon Eisenmenger, MD 06/23/2015

## 2015-06-23 NOTE — Telephone Encounter (Signed)
No voicemail set up but patient always sees that i called and calls back  anne

## 2015-06-28 ENCOUNTER — Ambulatory Visit: Payer: Medicare Other | Admitting: Hematology and Oncology

## 2015-06-29 ENCOUNTER — Telehealth: Payer: Self-pay | Admitting: Hematology and Oncology

## 2015-06-29 ENCOUNTER — Encounter: Payer: Self-pay | Admitting: Hematology and Oncology

## 2015-06-29 ENCOUNTER — Ambulatory Visit (HOSPITAL_BASED_OUTPATIENT_CLINIC_OR_DEPARTMENT_OTHER): Payer: Medicare Other | Admitting: Hematology and Oncology

## 2015-06-29 ENCOUNTER — Telehealth: Payer: Self-pay | Admitting: Pharmacist

## 2015-06-29 VITALS — BP 136/81 | HR 80 | Temp 98.3°F | Resp 18 | Ht 64.0 in | Wt 138.7 lb

## 2015-06-29 DIAGNOSIS — C50512 Malignant neoplasm of lower-outer quadrant of left female breast: Principal | ICD-10-CM

## 2015-06-29 DIAGNOSIS — C50911 Malignant neoplasm of unspecified site of right female breast: Secondary | ICD-10-CM

## 2015-06-29 DIAGNOSIS — C50912 Malignant neoplasm of unspecified site of left female breast: Secondary | ICD-10-CM | POA: Diagnosis not present

## 2015-06-29 DIAGNOSIS — C7951 Secondary malignant neoplasm of bone: Secondary | ICD-10-CM

## 2015-06-29 DIAGNOSIS — C50511 Malignant neoplasm of lower-outer quadrant of right female breast: Secondary | ICD-10-CM

## 2015-06-29 DIAGNOSIS — C50919 Malignant neoplasm of unspecified site of unspecified female breast: Secondary | ICD-10-CM

## 2015-06-29 MED ORDER — CAPECITABINE 500 MG PO TABS: 1000.0000 mg/m2 | ORAL_TABLET | Freq: Two times a day (BID) | ORAL | Status: DC

## 2015-06-29 NOTE — Telephone Encounter (Signed)
06/29/15 - Rx Faxed to [facility]

## 2015-06-29 NOTE — Addendum Note (Signed)
Addended by: Prentiss Bells on: 06/29/2015 03:30 PM   Modules accepted: Medications

## 2015-06-29 NOTE — Assessment & Plan Note (Addendum)
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 N1 M0 stage IIB  Radiology CT chest abdomen pelvis 05/28/2015: Developing subpectoral masses 3.5 cm and 4.5 cm for lung nodules the largest 1.4 cm in the right lung 1.7 cm sclerotic right iliac bone lesion concerning for metastatic disease Left subpectoral mass biopsy 06/21/2015: Invasive high-grade ductal carcinoma ER 5%, PR 0%, HER-2 negative ratio 1.29  Treatment plan: Because patient had failed antiestrogen therapy with anastrozole, I believe that the tumor even though is 5% estrogen receptor positive is behaving electrically negative disease. I recommended systemic chemotherapy with Xeloda 1000 mg/m 2 weeks 1 week off. Bone metastases: XGeva every 3 weeks  Chemotherapy counseling: I discussed risk and benefits of chemotherapy including the risk of nausea vomiting, diarrhea and hand-foot syndrome, risk of infection, low blood counts, liver and kidney toxicities as well as risk of long-term bone marrow damage and death. Patient understands these risks and is willing to proceed with treatment.  Return to clinic in 3 weeks to assess toxicities and tolerability to Xeloda 

## 2015-06-29 NOTE — Telephone Encounter (Signed)
Gave patient avs report and appointments for December thru March. Per 11/29 pof xgeva in 3wks then q4w.

## 2015-06-29 NOTE — Progress Notes (Signed)
Patient Care Team: [name], MD as PCP - General (Family Medicine) [name], MD (Cardiology)  DIAGNOSIS: Bilateral breast cancer [place] [facility])   Staging form: Breast, AJCC 7th Edition     Clinical: Stage IIB (T2, N1, cM0) - Unsigned       Staging comments: Staged at breast conference 08/13/13      Pathologic: No stage assigned - Unsigned   SUMMARY OF ONCOLOGIC HISTORY:   Bilateral breast cancer ([place])   07/23/2013 Mammogram Bilateral breast masses. 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 N1 M0 stage IIB ER 100%, PR 0%, HER-2    06/17/2014 -  Radiation Therapy Adjuvant radiation therapy   09/14/2014 -  Anti-estrogen oral therapy Anastrozole 1 mg daily   05/28/2015 Imaging CT scans: Enlarging subpectoral masses 3.1 x 3.5 cm, posteriorly lower density mass 4.5 x 2.1 cm, several right-sided lung nodules right lower lobe 1.4 cm, 3 other right lung nodules, 1.7 cm right iliac bone lesion   06/21/2015 Procedure Left subpectoral mass biopsy: Invasive high-grade  ductal carcinoma ER 5%, PR 0%, HER-2 negative ratio 1.29    CHIEF COMPLIANT: follow-up to discuss left subpectoral mass biopsy  INTERVAL HISTORY: Paula Navarro is a 64 year old with above-mentioned history of relapsed left breast cancer with metastatic disease. She was found to have large subpectoral masses along with several lung nodules and 1.7 cm right iliac bone lesion. She underwent a biopsy of the subpectoral mass which came back as metastatic breast cancer high-grade ER 5% PR 0% HER-2 negative. She is here today to discuss a treatment plan. She is doing much better with regards to the pain. She continues to have markedly discomfort around the swollen left pectoral area.  REVIEW OF SYSTEMS:   Constitutional: Denies fevers, chills or abnormal weight loss Eyes: Denies blurriness of vision Ears, nose, mouth, throat, and face: Denies mucositis or sore throat Respiratory: Denies cough, dyspnea or wheezes Cardiovascular: Denies palpitation, chest discomfort or lower extremity swelling Gastrointestinal:  Denies nausea, heartburn or change in bowel habits Skin: Denies abnormal skin rashes Lymphatics: Denies new lymphadenopathy or easy bruising Neurological:Denies numbness, tingling or new weaknesses Behavioral/Psych: Mood is stable, no new changes  Breast: left chest wall swollen and painful All other systems were reviewed with the patient and are negative.  I have reviewed the past medical history, past surgical history, social history and family history with the patient and they are unchanged from previous note.  ALLERGIES:  is allergic to codeine and morphine and related.  MEDICATIONS:  Current Outpatient Prescriptions  Medication Sig Dispense Refill  . anastrozole (ARIMIDEX) 1 MG tablet Take 1 tablet (1 mg total) by mouth daily. 90 tablet 6  . B Complex-C (SUPER B COMPLEX PO) Take 1 tablet by mouth daily.    Marland Kitchen gabapentin (NEURONTIN) 300 MG capsule TAKE 2 CAPSULES (600 MG TOTAL) BY  MOUTH 3 (THREE) TIMES DAILY. 180 capsule 5  . oxyCODONE-acetaminophen (PERCOCET/ROXICET) 5-325 MG tablet 1 tabs PO q6h prn pain 60 tablet 0   No current facility-administered medications for this visit.    PHYSICAL EXAMINATION: ECOG PERFORMANCE STATUS: 1 - Symptomatic but completely ambulatory  Filed Vitals:   06/29/15 1039  BP: 136/81  Pulse: 80  Temp: 98.3 F (36.8 C)  Resp: 18   Filed Weights   06/29/15 1039  Weight: 138 lb 11.2 oz (62.914 kg)    GENERAL:alert, no distress and comfortable SKIN: skin color, texture, turgor are normal, no rashes or significant lesions EYES: normal, Conjunctiva are pink and non-injected, sclera clear OROPHARYNX:no exudate, no erythema and lips, buccal mucosa, and tongue normal  NECK: supple, thyroid normal size, non-tender, without nodularity LYMPH:  no palpable lymphadenopathy in the cervical, axillary or inguinal LUNGS: clear to auscultation and percussion with normal breathing effort HEART: regular rate & rhythm and no murmurs and no lower extremity edema ABDOMEN:abdomen soft, non-tender and normal bowel sounds Musculoskeletal:no cyanosis of digits and no clubbing  NEURO: alert & oriented x 3 with fluent speech, no focal motor/sensory deficits  LABORATORY DATA:  I have reviewed the data as listed   Chemistry      Component Value Date/Time   NA 141 05/28/2015 0823   NA 133* 01/07/2015 1409   K 4.0 05/28/2015 0823   K 4.3 01/07/2015 1409   CL 95* 01/07/2015 1409   CO2 26 05/28/2015 0823   CO2 26 01/07/2015 1409   BUN 15.5 05/28/2015 0823   BUN 10 01/07/2015 1409   CREATININE 0.8 05/28/2015 0823   CREATININE 0.79 01/07/2015 1409      Component Value Date/Time   CALCIUM 10.0 05/28/2015 0823   CALCIUM 9.3 01/07/2015 1409   ALKPHOS 68 05/28/2015 0823   AST 33 05/28/2015 0823   ALT 32 05/28/2015 0823   BILITOT 0.87 05/28/2015 0823       Lab Results  Component Value Date   WBC 7.8 05/28/2015   HGB 12.2 05/28/2015   HCT  38.3 05/28/2015   MCV 72.5* 05/28/2015   PLT 290 05/28/2015   NEUTROABS 4.7 05/28/2015   ASSESSMENT & PLAN:  Bilateral breast cancer ([place]) Treated with neoadjuvant chemotherapy followed by surgery 04/08/2014, radiation treatment completed 07/17/2014, antiestrogen therapy with anastrozole 09/14/2014 Left breast T2, N0, M0, IDC grade 3; 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 N1 M0 stage IIB  Radiology CT chest abdomen pelvis 05/28/2015: Developing subpectoral masses 3.5 cm and 4.5 cm for lung nodules the largest 1.4 cm in the right lung 1.7 cm sclerotic right iliac bone lesion concerning for metastatic disease Left subpectoral mass biopsy 06/21/2015: Invasive high-grade ductal carcinoma ER 5%, PR 0%, HER-2 negative ratio 1.29  Treatment plan: Because patient had failed antiestrogen therapy with anastrozole, I believe that the tumor even though is 5% estrogen receptor positive is behaving electrically negative disease. I recommended systemic chemotherapy with [place] Days 1 and 8 every 3 weeks. Bone metastases: Z[place]eta every  3 weeks  Chemotherapy counseling: I discussed risk and benefits of chemotherapy including the risk of nausea vomiting, neuropathy, risk of infection, low blood counts, liver and kidney toxicities as well as risk of long-term bone marrow damage and death. Patient understands these risks and is willing to proceed with treatment. Plan to start treatment next week  Goals of treatment: Prolong her life, showing the cancers, improve her symptoms. Patient understands that we cannot cure her breast cancer. I cannot predict her prognosis in terms of the number of years she has left.we will have to wait and see how well she responds to treatment.  Return to clinic in 3 weeks for blood work and toxicity evaluation. I asked the  patient to bring her son and daughter so that we can discuss her case in detail with them.  No orders of the defined types were placed in this encounter.   The patient has a good understanding of the overall plan. she agrees with it. she will call with any problems that may develop before the next visit here.   Rulon Eisenmenger, MD 06/29/2015

## 2015-07-02 ENCOUNTER — Other Ambulatory Visit: Payer: Self-pay

## 2015-07-02 ENCOUNTER — Telehealth: Payer: Self-pay | Admitting: *Deleted

## 2015-07-02 DIAGNOSIS — C50511 Malignant neoplasm of lower-outer quadrant of right female breast: Secondary | ICD-10-CM

## 2015-07-02 DIAGNOSIS — C50512 Malignant neoplasm of lower-outer quadrant of left female breast: Principal | ICD-10-CM

## 2015-07-02 MED ORDER — OXYCODONE-ACETAMINOPHEN 5-325 MG PO TABS: ORAL_TABLET | ORAL | Status: DC

## 2015-07-02 NOTE — Telephone Encounter (Signed)
Percocet refilled.  Placed in prescription book.  Attempted to notify patient - no answer unable to leave message voice mail not set up.

## 2015-07-02 NOTE — Telephone Encounter (Signed)
TC from patient requesting refill on her percocet 5/325. Last filled 06/14/15 for 60 tabs.. Pt states she is out of her pain meds.   Please call pt when prescription is available.    Pt also asking about her oral chemo pills. She has not heard about them. Reviewed meds with patient and advised her that her Xeloda prescription was fax'd to [facility]. Pt was not aware of that. Informed pt that i would call [facility] and get information back to her.  TC to [facility]. They do have prescription and have attempted to call pt w/o success and they were unable to leave VM as pt does not have VM set up on her phone. They will contact patient today.

## 2015-07-05 ENCOUNTER — Telehealth: Payer: Self-pay | Admitting: Oncology

## 2015-07-05 NOTE — Telephone Encounter (Signed)
Maylon Peppers after receiving triage after hours call note.  Omie said she has questions about her chemotherapy and needed to talk to Dr. Lindi Adie.  She asked if his office could call her.  Lucendia Herrlich, RN and she will call patient.

## 2015-07-13 ENCOUNTER — Encounter: Payer: Self-pay | Admitting: Pharmacist

## 2015-07-13 DIAGNOSIS — Z5111 Encounter for antineoplastic chemotherapy: Secondary | ICD-10-CM

## 2015-07-13 DIAGNOSIS — Z7189 Other specified counseling: Secondary | ICD-10-CM | POA: Insufficient documentation

## 2015-07-13 NOTE — Progress Notes (Signed)
Oral Chemotherapy Pharmacist Encounter   I spoke with patient for overview of new oral chemotherapy medication: Xeloda. Pt is doing well. She started the medication on Sunday 07/04/15. She is tolerating well with no complaints or side effects. No missed doses to report. Pt uses a pill box. She will complete Day 14 of Cycle 1 on 07/17/15 followed by 7 days rest. Cycle 2 will start on Sunday 12/25 (Christmas Day).   Rx for Xeloda is filled through [facility]. Patient has 3 refills remaining and knows to call the pharmacy next week (a few days ahead of cycle 2) in order to refill her xeloda.  Counseled patient on administration, dosing, side effects, safe handling, and monitoring. Side effects include but not limited to: Fatigue, diarrhea, mouth sores, and hand/foot syndrome.  Ms. Henkes voiced understanding and appreciation.   All questions answered.  Will follow up in 2 weeks for adherence and toxicity management.   Thank you,  Montel Clock, PharmD, [facility]

## 2015-07-20 ENCOUNTER — Ambulatory Visit (HOSPITAL_BASED_OUTPATIENT_CLINIC_OR_DEPARTMENT_OTHER): Payer: Medicare Other | Admitting: Hematology and Oncology

## 2015-07-20 ENCOUNTER — Encounter: Payer: Self-pay | Admitting: Hematology and Oncology

## 2015-07-20 ENCOUNTER — Ambulatory Visit: Payer: Medicare Other

## 2015-07-20 ENCOUNTER — Telehealth: Payer: Self-pay | Admitting: Hematology and Oncology

## 2015-07-20 ENCOUNTER — Other Ambulatory Visit (HOSPITAL_BASED_OUTPATIENT_CLINIC_OR_DEPARTMENT_OTHER): Payer: Medicare Other

## 2015-07-20 VITALS — BP 118/69 | HR 86 | Temp 98.6°F | Resp 18 | Ht 64.0 in | Wt 135.7 lb

## 2015-07-20 DIAGNOSIS — C50512 Malignant neoplasm of lower-outer quadrant of left female breast: Principal | ICD-10-CM

## 2015-07-20 DIAGNOSIS — C50511 Malignant neoplasm of lower-outer quadrant of right female breast: Secondary | ICD-10-CM

## 2015-07-20 DIAGNOSIS — C7951 Secondary malignant neoplasm of bone: Secondary | ICD-10-CM | POA: Insufficient documentation

## 2015-07-20 LAB — CBC WITH DIFFERENTIAL/PLATELET
BASO%: 0.4 % (ref 0.0–2.0)
BASOS ABS: 0 10*3/uL (ref 0.0–0.1)
EOS%: 2.7 % (ref 0.0–7.0)
Eosinophils Absolute: 0.3 10*3/uL (ref 0.0–0.5)
HCT: 38.9 % (ref 34.8–46.6)
HEMOGLOBIN: 11.8 g/dL (ref 11.6–15.9)
LYMPH%: 23.5 % (ref 14.0–49.7)
MCH: 22.2 pg — AB (ref 25.1–34.0)
MCHC: 30.2 g/dL — ABNORMAL LOW (ref 31.5–36.0)
MCV: 73.4 fL — ABNORMAL LOW (ref 79.5–101.0)
MONO#: 0.7 10*3/uL (ref 0.1–0.9)
MONO%: 6.4 % (ref 0.0–14.0)
NEUT%: 67 % (ref 38.4–76.8)
NEUTROS ABS: 7.6 10*3/uL — AB (ref 1.5–6.5)
Platelets: 277 10*3/uL (ref 145–400)
RBC: 5.31 10*6/uL (ref 3.70–5.45)
RDW: 14.1 % (ref 11.2–14.5)
WBC: 11.3 10*3/uL — AB (ref 3.9–10.3)
lymph#: 2.7 10*3/uL (ref 0.9–3.3)

## 2015-07-20 LAB — COMPREHENSIVE METABOLIC PANEL
ALBUMIN: 3.8 g/dL (ref 3.5–5.0)
ALT: 34 U/L (ref 0–55)
AST: 39 U/L — AB (ref 5–34)
Alkaline Phosphatase: 69 U/L (ref 40–150)
Anion Gap: 10 mEq/L (ref 3–11)
BUN: 9.6 mg/dL (ref 7.0–26.0)
CHLORIDE: 101 meq/L (ref 98–109)
CO2: 26 meq/L (ref 22–29)
Calcium: 10.3 mg/dL (ref 8.4–10.4)
Creatinine: 0.9 mg/dL (ref 0.6–1.1)
EGFR: 77 mL/min/{1.73_m2} — ABNORMAL LOW (ref >90)
GLUCOSE: 136 mg/dL (ref 70–140)
POTASSIUM: 4.7 meq/L (ref 3.5–5.1)
SODIUM: 137 meq/L (ref 136–145)
Total Bilirubin: 0.94 mg/dL (ref 0.20–1.20)
Total Protein: 7.7 g/dL (ref 6.4–8.3)

## 2015-07-20 NOTE — Telephone Encounter (Signed)
Appointments made and avs printed for patient °

## 2015-07-20 NOTE — Progress Notes (Signed)
 Patient Care Team: Angus McInnis, MD as PCP - General (Family Medicine) Robert M Rothbart, MD (Cardiology)  DIAGNOSIS: Bilateral breast cancer (HCC)   Staging form: Breast, AJCC 7th Edition     Clinical: Stage IIB (T2, N1, cM0) - Unsigned       Staging comments: Staged at breast conference 08/13/13      Pathologic: No stage assigned - Unsigned   SUMMARY OF ONCOLOGIC HISTORY:   Bilateral breast cancer (HCC)   07/23/2013 Mammogram Bilateral breast masses. 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 N1 M0 stage IIB ER 100%, PR 0%, HER-2    06/17/2014 -  Radiation Therapy Adjuvant radiation therapy   09/14/2014 -  Anti-estrogen oral therapy Anastrozole 1 mg daily   05/28/2015 Imaging CT scans: Enlarging subpectoral masses 3.1 x 3.5 cm, posteriorly lower density mass 4.5 x 2.1 cm, several right-sided lung nodules right lower lobe 1.4 cm, 3 other right lung nodules, 1.7 cm right iliac bone lesion   06/21/2015 Procedure Left subpectoral mass biopsy: Invasive high-grade  ductal carcinoma ER 5%, PR 0%, HER-2 negative ratio 1.29    CHIEF COMPLIANT: follow-up on Xeloda  INTERVAL HISTORY: Paula Navarro is a 64-year-old with above-mentioned history of metastatic breast cancer who was started on oral Xeloda. She appears to be tolerating it extremely well. She does not have any nausea or vomiting. Denies any mucositis. Denies any rash on the hands or feet.pain in the left chest wall is slightly better. Although the swelling is slightly worse than before.  REVIEW OF SYSTEMS:   Constitutional: Denies fevers, chills or abnormal weight loss Eyes: Denies blurriness of vision Ears, nose, mouth, throat, and face: Denies mucositis or sore throat Respiratory: Denies cough, dyspnea or wheezes Cardiovascular: Denies palpitation, chest discomfort or lower extremity swelling Gastrointestinal:  Denies nausea, heartburn or change in bowel habits Skin: Denies abnormal skin rashes Lymphatics: Denies new lymphadenopathy or easy bruising Neurological:Denies numbness, tingling or new weaknesses Behavioral/Psych: Mood is stable, no new changes  Breast: swollen left chest wall with palpable mass All other systems were reviewed with the patient and are negative.  I have reviewed the past medical history, past surgical history, social history and family history with the patient and they are unchanged from previous note.  ALLERGIES:  is allergic to codeine and morphine and related.  MEDICATIONS:  Current Outpatient Prescriptions  Medication Sig Dispense Refill  . B Complex-C (SUPER B COMPLEX PO) Take 1 tablet by mouth daily.    . capecitabine (XELODA) 500 MG tablet Take 3 tablets (  1,500 mg total) by mouth 2 (two) times daily after a meal. 84 tablet 3  . gabapentin (NEURONTIN) 300 MG capsule TAKE 2 CAPSULES (600 MG TOTAL) BY MOUTH 3 (THREE) TIMES DAILY. 180 capsule 5  . LORazepam (ATIVAN) 1 MG tablet     . oxyCODONE-acetaminophen (PERCOCET/ROXICET) 5-325 MG tablet 1 tabs PO q6h prn  pain 60 tablet 0   No current facility-administered medications for this visit.    PHYSICAL EXAMINATION: ECOG PERFORMANCE STATUS: 1 - Symptomatic but completely ambulatory  Filed Vitals:   07/20/15 1012  BP: 118/69  Pulse: 86  Temp: 98.6 F (37 C)  Resp: 18   Filed Weights   07/20/15 1012  Weight: 135 lb 11.2 oz (61.553 kg)    GENERAL:alert, no distress and comfortable SKIN: skin color, texture, turgor are normal, no rashes or significant lesions EYES: normal, Conjunctiva are pink and non-injected, sclera clear OROPHARYNX:no exudate, no erythema and lips, buccal mucosa, and tongue normal  NECK: supple, thyroid normal size, non-tender, without nodularity LYMPH:  no palpable lymphadenopathy in the cervical, axillary or inguinal LUNGS: clear to auscultation and percussion with normal breathing effort HEART: regular rate & rhythm and no murmurs and no lower extremity edema ABDOMEN:abdomen soft, non-tender and normal bowel sounds Musculoskeletal:no cyanosis of digits and no clubbing  NEURO: alert & oriented x 3 with fluent speech, no focal motor/sensory deficits BREAST:palpable mass in the chest wall on the left (exam performed in the presence of a chaperone)  LABORATORY DATA:  I have reviewed the data as listed   Chemistry      Component Value Date/Time   NA 137 07/20/2015 1000   NA 133* 01/07/2015 1409   K 4.7 07/20/2015 1000   K 4.3 01/07/2015 1409   CL 95* 01/07/2015 1409   CO2 26 07/20/2015 1000   CO2 26 01/07/2015 1409   BUN 9.6 07/20/2015 1000   BUN 10 01/07/2015 1409   CREATININE 0.9 07/20/2015 1000   CREATININE 0.79 01/07/2015 1409      Component Value Date/Time   CALCIUM 10.3 07/20/2015 1000   CALCIUM 9.3 01/07/2015 1409   ALKPHOS 69 07/20/2015 1000   AST 39* 07/20/2015 1000   ALT 34 07/20/2015 1000   BILITOT 0.94 07/20/2015 1000       Lab Results  Component Value Date   WBC 11.3* 07/20/2015   HGB 11.8 07/20/2015   HCT 38.9 07/20/2015   MCV  73.4* 07/20/2015   PLT 277 07/20/2015   NEUTROABS 7.6* 07/20/2015    ASSESSMENT & PLAN:  Bilateral breast cancer ([place]) Treated with neoadjuvant chemotherapy followed by surgery 04/08/2014, radiation treatment completed 07/17/2014, antiestrogen therapy with anastrozole 09/14/2014 Left breast T2, N0, M0, IDC grade 3; 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 N1 M0 stage IIB  Radiology CT chest abdomen pelvis 05/28/2015: Developing subpectoral masses 3.5 cm and 4.5 cm. Four lung nodules the largest 1.4 cm in the right lung 1.7 cm sclerotic right iliac bone lesion concerning for metastatic disease Left subpectoral mass biopsy 06/21/2015: Invasive high-grade ductal carcinoma ER 5%, PR 0%, HER-2 negative ratio 1.29  Treatment plan: Because patient had failed antiestrogen therapy with anastrozole, I believe that the tumor even though is 5% estrogen receptor positive is behaving like ER negative disease. Xeloda 1500 mg by mouth twice a day started 07/07/2015 Bone metastases:  Zometa every 3 weeks with Calcium + D To start January 2017 every 6 weeks to quad neck with chemotherapy ---------------------------------------------------------------------------------------------------------------------------------------------------------- Current treatment: Xeloda 1500 mg by mouth twice a day started 07/07/2015 Xeloda toxicities: Denies any side effects of Xeloda. Denies nay diarrhea or nausea or vomiting., Denies any mouth sores, denies any fatigue.  Return to clinic in 4 weeks for blood work and follow-up that will be the start of cycle 3   No orders of the defined types were placed in this encounter.   The patient has a good understanding of the overall plan. she agrees with it. she will call with any problems that may develop before the next  visit here.   Gudena, Vinay K, MD 07/20/2015      

## 2015-07-20 NOTE — Assessment & Plan Note (Signed)
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 N1 M0 stage IIB  Radiology CT chest abdomen pelvis 05/28/2015: Developing subpectoral masses 3.5 cm and 4.5 cm. Four lung nodules the largest 1.4 cm in the right lung 1.7 cm sclerotic right iliac bone lesion concerning for metastatic disease Left subpectoral mass biopsy 06/21/2015: Invasive high-grade ductal carcinoma ER 5%, PR 0%, HER-2 negative ratio 1.29  Treatment plan: Because patient had failed antiestrogen therapy with anastrozole, I believe that the tumor even though is 5% estrogen receptor positive is behaving like ER negative disease. Xeloda 1500 mg by mouth twice a day started 07/07/2015 Bone metastases: Zometa every 3 weeks with Calcium + D ---------------------------------------------------------------------------------------------------------------------------------------------------------- Current treatment: Xeloda 1500 mg by mouth twice a day started 07/07/2015 Xeloda toxicities:  Return to clinic in 3 weeks for blood work and follow-up

## 2015-07-27 ENCOUNTER — Encounter: Payer: Self-pay | Admitting: Hematology and Oncology

## 2015-07-27 NOTE — Progress Notes (Signed)
Fax copy was left on my desk --faxed 07/06/15 to [phone]-- PAF copy relief

## 2015-07-30 ENCOUNTER — Other Ambulatory Visit: Payer: Self-pay | Admitting: *Deleted

## 2015-07-30 ENCOUNTER — Telehealth: Payer: Self-pay | Admitting: *Deleted

## 2015-07-30 DIAGNOSIS — C50511 Malignant neoplasm of lower-outer quadrant of right female breast: Secondary | ICD-10-CM

## 2015-07-30 DIAGNOSIS — C50512 Malignant neoplasm of lower-outer quadrant of left female breast: Principal | ICD-10-CM

## 2015-07-30 MED ORDER — OXYCODONE-ACETAMINOPHEN 5-325 MG PO TABS: ORAL_TABLET | ORAL | Status: DC

## 2015-07-30 NOTE — Telephone Encounter (Signed)
Let patient know that prescription was ready

## 2015-07-30 NOTE — Telephone Encounter (Signed)
TC from patient requesting refill on her percocet.  Last filled on 07/02/15 for 60 tabs.  Please call patient when prescription is ready for pick up

## 2015-08-04 ENCOUNTER — Telehealth: Payer: Self-pay | Admitting: Pharmacist

## 2015-08-04 NOTE — Telephone Encounter (Addendum)
Oral Chemotherapy Follow-Up Form  Original Start date of oral chemotherapy: _12/4/16_  Called patient today to follow up regarding patient's oral chemotherapy medication: _Xeloda__  Pt is doing well today. No issues to report. No missed doses. No side effects to report  Pt reports __0__ tablets/doses missed in the last week/month.    Will follow up and call patient again in _1 month________   Thank you,  Montel Clock, PharmD, [facility]

## 2015-08-10 MED FILL — CAPECITABINE 500 MG TABLET: 500 | 14 days supply | Qty: 84 | Fill #2

## 2015-08-12 ENCOUNTER — Telehealth: Payer: Self-pay | Admitting: *Deleted

## 2015-08-12 NOTE — Telephone Encounter (Signed)
"  When do I see Dr. Lindi Adie again?  I have lump on left chest wall.  I think cancer has come back.  Area is swollen and hard.  Swelling varies to left underarm.  Sometimes it's the size of a softball and others feels like a golf ball.  The rest of my left arm looks normal.  Noticed it getting bigger since biopsy in August."  Next scheduled F/U 08-16-2014 beginning at 11:15 with lab.  "I'll wait until then to talk with Dr. Lindi Adie about this,"

## 2015-08-16 NOTE — Assessment & Plan Note (Signed)
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 N1 M0 stage IIB  Radiology CT chest abdomen pelvis 05/28/2015: Developing subpectoral masses 3.5 cm and 4.5 cm. Four lung nodules the largest 1.4 cm in the right lung 1.7 cm sclerotic right iliac bone lesion concerning for metastatic disease Left subpectoral mass biopsy 06/21/2015: Invasive high-grade ductal carcinoma ER 5%, PR 0%, HER-2 negative ratio 1.29  Treatment plan: Because patient had failed antiestrogen therapy with anastrozole, I believe that the tumor even though is 5% estrogen receptor positive is behaving like ER negative disease. Xeloda 1500 mg by mouth twice a day started 07/07/2015 Bone metastases: Zometa every 3 weeks with Calcium + D To start January 2017 every 6 weeks to quad neck with chemotherapy ---------------------------------------------------------------------------------------------------------------------------------------------------------- Current treatment: Xeloda 1500 mg by mouth twice a day started 07/07/2015 Xeloda toxicities: Denies any side effects of Xeloda. Denies any diarrhea or nausea or vomiting., Denies any mouth sores, denies any fatigue.  Return to clinic in 4 weeks for blood work and follow-up that will be the start of cycle 4

## 2015-08-17 ENCOUNTER — Other Ambulatory Visit (HOSPITAL_BASED_OUTPATIENT_CLINIC_OR_DEPARTMENT_OTHER): Payer: Medicare Other

## 2015-08-17 ENCOUNTER — Other Ambulatory Visit: Payer: Self-pay | Admitting: *Deleted

## 2015-08-17 ENCOUNTER — Ambulatory Visit (HOSPITAL_BASED_OUTPATIENT_CLINIC_OR_DEPARTMENT_OTHER): Payer: Medicare Other

## 2015-08-17 ENCOUNTER — Ambulatory Visit: Payer: Medicare Other

## 2015-08-17 ENCOUNTER — Telehealth: Payer: Self-pay | Admitting: Hematology and Oncology

## 2015-08-17 ENCOUNTER — Ambulatory Visit (HOSPITAL_BASED_OUTPATIENT_CLINIC_OR_DEPARTMENT_OTHER): Payer: Medicare Other | Admitting: Hematology and Oncology

## 2015-08-17 VITALS — BP 130/65 | HR 79 | Temp 98.3°F | Resp 18 | Wt 140.8 lb

## 2015-08-17 DIAGNOSIS — C50512 Malignant neoplasm of lower-outer quadrant of left female breast: Secondary | ICD-10-CM

## 2015-08-17 DIAGNOSIS — C7951 Secondary malignant neoplasm of bone: Secondary | ICD-10-CM

## 2015-08-17 DIAGNOSIS — C50511 Malignant neoplasm of lower-outer quadrant of right female breast: Secondary | ICD-10-CM | POA: Diagnosis not present

## 2015-08-17 LAB — CBC WITH DIFFERENTIAL/PLATELET
BASO%: 0.6 % (ref 0.0–2.0)
BASOS ABS: 0.1 10*3/uL (ref 0.0–0.1)
EOS ABS: 0.3 10*3/uL (ref 0.0–0.5)
EOS%: 3.5 % (ref 0.0–7.0)
HEMATOCRIT: 36.2 % (ref 34.8–46.6)
HGB: 11.2 g/dL — ABNORMAL LOW (ref 11.6–15.9)
LYMPH%: 25.1 % (ref 14.0–49.7)
MCH: 23 pg — AB (ref 25.1–34.0)
MCHC: 30.9 g/dL — AB (ref 31.5–36.0)
MCV: 74.5 fL — AB (ref 79.5–101.0)
MONO#: 0.7 10*3/uL (ref 0.1–0.9)
MONO%: 7.8 % (ref 0.0–14.0)
NEUT#: 5.7 10*3/uL (ref 1.5–6.5)
NEUT%: 63 % (ref 38.4–76.8)
PLATELETS: 250 10*3/uL (ref 145–400)
RBC: 4.86 10*6/uL (ref 3.70–5.45)
RDW: 18 % — ABNORMAL HIGH (ref 11.2–14.5)
WBC: 9.1 10*3/uL (ref 3.9–10.3)
lymph#: 2.3 10*3/uL (ref 0.9–3.3)

## 2015-08-17 LAB — COMPREHENSIVE METABOLIC PANEL
ALT: 34 U/L (ref 0–55)
AST: 42 U/L — AB (ref 5–34)
Albumin: 3.7 g/dL (ref 3.5–5.0)
Alkaline Phosphatase: 57 U/L (ref 40–150)
Anion Gap: 8 mEq/L (ref 3–11)
BUN: 12.3 mg/dL (ref 7.0–26.0)
CALCIUM: 9.9 mg/dL (ref 8.4–10.4)
CHLORIDE: 104 meq/L (ref 98–109)
CO2: 26 meq/L (ref 22–29)
CREATININE: 0.9 mg/dL (ref 0.6–1.1)
EGFR: 84 mL/min/{1.73_m2} — ABNORMAL LOW (ref >90)
GLUCOSE: 133 mg/dL (ref 70–140)
POTASSIUM: 4.2 meq/L (ref 3.5–5.1)
SODIUM: 138 meq/L (ref 136–145)
Total Bilirubin: 1.06 mg/dL (ref 0.20–1.20)
Total Protein: 7.2 g/dL (ref 6.4–8.3)

## 2015-08-17 MED ORDER — DENOSUMAB 120 MG/1.7ML ~~LOC~~ SOLN
120.0000 mg | Freq: Once | SUBCUTANEOUS | Status: AC
  Administered 2015-08-17: 120 mg via SUBCUTANEOUS
  Filled 2015-08-17: qty 1.7

## 2015-08-17 MED ORDER — CALCIUM CARBONATE-VITAMIN D 500-400 MG-UNIT PO TABS
1.0000 | ORAL_TABLET | Freq: Two times a day (BID) | ORAL | Status: DC
Start: 2015-08-17 — End: 2015-08-18

## 2015-08-17 NOTE — Telephone Encounter (Signed)
Appointments made and avs pritned for patient °

## 2015-08-17 NOTE — Progress Notes (Signed)
Patient Care Team: [name], MD as PCP - General (Family Medicine) [name], MD (Cardiology)  DIAGNOSIS: Bilateral breast cancer [facility])   Staging form: Breast, AJCC 7th Edition     Clinical: Stage IIB (T2, N1, cM0) - Unsigned       Staging comments: Staged at breast conference 08/13/13      Pathologic: No stage assigned - Unsigned  SUMMARY OF ONCOLOGIC HISTORY:   Bilateral breast cancer ([place])   07/23/2013 Mammogram Bilateral breast masses. 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 N1 M0 stage IIB ER 100%, PR 0%, HER-2    06/17/2014 -  Radiation Therapy Adjuvant radiation therapy   09/14/2014 -  Anti-estrogen oral therapy Anastrozole 1 mg daily   05/28/2015 Imaging CT scans: Enlarging subpectoral masses 3.1 x 3.5 cm, posteriorly lower density mass 4.5 x 2.1 cm, several right-sided lung nodules right lower lobe 1.4 cm, 3 other right lung nodules, 1.7 cm right iliac bone lesion   06/21/2015 Procedure Left subpectoral mass biopsy: Invasive high-grade  ductal carcinoma ER 5%, PR 0%, HER-2 negative ratio 1.29    CHIEF COMPLIANT: Follow-up on Xeloda  INTERVAL HISTORY: Paula Navarro is a 65 year old with above-mentioned history of metastatic breast cancer with enlarged left subpectoral mass with bone metastases currently on Xeloda with X [place]. She appears to be tolerating Xeloda extremely well. She does not have any diarrhea or nausea or vomiting. Denies any rashes on her hands or feet. She is starting back on X GYN injections today. We plan to give her Xgeva every 6 weeks.  REVIEW OF SYSTEMS:   Constitutional: Denies fevers, chills or abnormal weight loss Eyes: Denies blurriness of vision Ears, nose, mouth, throat, and face: Denies mucositis or sore throat Respiratory: Denies cough, dyspnea or wheezes Cardiovascular: Denies palpitation, chest discomfort Gastrointestinal:  Denies nausea, heartburn or change in bowel habits Skin: Denies abnormal skin rashes Lymphatics: Denies new lymphadenopathy or easy bruising Neurological:Denies numbness, tingling or new weaknesses Behavioral/Psych: Mood is stable, no new changes  Extremities: No lower extremity edema Breast:  Large left upper chest wall mass extending into the left axilla All other systems were reviewed with the patient and are negative.  I have reviewed the past medical history, past surgical history, social history and family history with the patient and they are unchanged from previous note.  ALLERGIES:  is allergic to codeine and morphine and related.  MEDICATIONS:  Current Outpatient Prescriptions  Medication Sig Dispense Refill  . B Complex-C (SUPER B COMPLEX PO) Take 1 tablet by  mouth daily.    . capecitabine (XELODA) 500 MG tablet Take 3 tablets (1,500 mg total) by mouth 2 (two) times daily after a meal. 84 tablet 3  . gabapentin (NEURONTIN) 300 MG capsule TAKE 2 CAPSULES (600 MG TOTAL) BY MOUTH 3 (THREE) TIMES DAILY. 180 capsule 5  . LORazepam (ATIVAN) 1 MG tablet     .  oxyCODONE-acetaminophen (PERCOCET/ROXICET) 5-325 MG tablet 1 tabs PO q6h prn pain 60 tablet 0   No current facility-administered medications for this visit.    PHYSICAL EXAMINATION: ECOG PERFORMANCE STATUS: 1 - Symptomatic but completely ambulatory  Filed Vitals:   08/17/15 1028  BP: 130/65  Pulse: 79  Temp: 98.3 F (36.8 C)  Resp: 18   Filed Weights   08/17/15 1028  Weight: 140 lb 12.8 oz (63.866 kg)    GENERAL:alert, no distress and comfortable SKIN: skin color, texture, turgor are normal, no rashes or significant lesions EYES: normal, Conjunctiva are pink and non-injected, sclera clear OROPHARYNX:no exudate, no erythema and lips, buccal mucosa, and tongue normal  NECK: supple, thyroid normal size, non-tender, without nodularity LYMPH:  no palpable lymphadenopathy in the cervical, axillary or inguinal LUNGS: clear to auscultation and percussion with normal breathing effort HEART: regular rate & rhythm and no murmurs and no lower extremity edema ABDOMEN:abdomen soft, non-tender and normal bowel sounds MUSCULOSKELETAL:no cyanosis of digits and no clubbing  NEURO: alert & oriented x 3 with fluent speech, no focal motor/sensory deficits EXTREMITIES: No lower extremity edema BREAST:Large left upper chest wall mass extending of the left axilla less tender to palpation.. (exam performed in the presence of a chaperone)  LABORATORY DATA:  I have reviewed the data as listed   Chemistry      Component Value Date/Time   NA 137 07/20/2015 1000   NA 133* 01/07/2015 1409   K 4.7 07/20/2015 1000   K 4.3 01/07/2015 1409   CL 95* 01/07/2015 1409   CO2 26 07/20/2015 1000   CO2 26 01/07/2015 1409   BUN 9.6 07/20/2015 1000   BUN 10 01/07/2015 1409   CREATININE 0.9 07/20/2015 1000   CREATININE 0.79 01/07/2015 1409      Component Value Date/Time   CALCIUM 10.3 07/20/2015 1000   CALCIUM 9.3 01/07/2015 1409   ALKPHOS 69 07/20/2015 1000   AST 39* 07/20/2015 1000   ALT 34 07/20/2015  1000   BILITOT 0.94 07/20/2015 1000      Lab Results  Component Value Date   WBC 9.1 08/17/2015   HGB 11.2* 08/17/2015   HCT 36.2 08/17/2015   MCV 74.5* 08/17/2015   PLT 250 08/17/2015   NEUTROABS 5.7 08/17/2015   ASSESSMENT & PLAN:  Bilateral breast cancer ([place]) Treated with neoadjuvant chemotherapy followed by surgery 04/08/2014, radiation treatment completed 07/17/2014, antiestrogen therapy with anastrozole 09/14/2014 Left breast T2, N0, M0, IDC grade 3; 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 N1 M0 stage IIB  Radiology CT chest abdomen pelvis 05/28/2015: Developing subpectoral masses 3.5 cm and 4.5 cm. Four lung nodules the largest 1.4 cm in the right lung 1.7 cm sclerotic right iliac bone lesion concerning for metastatic disease Left subpectoral mass biopsy 06/21/2015: Invasive high-grade ductal carcinoma ER 5%, PR 0%, HER-2 negative ratio 1.29  Treatment plan: Because patient had failed antiestrogen therapy with anastrozole, I believe that the tumor even though  is 5% estrogen receptor positive is behaving like ER negative disease. Xeloda 1500 mg by mouth twice a day started 07/07/2015 Bone metastases: Xgeva every 6 weeks with Calcium + D started January 2017 ---------------------------------------------------------------------------------------------------------------------------------------------------------- Current treatment: Xeloda 1500 mg by mouth twice a day started 07/07/2015 Xeloda toxicities: Denies any side effects of Xeloda. Denies any diarrhea or nausea or vomiting., Denies any mouth sores, denies any fatigue.  Return to clinic in 6 weeks for labs and X Geva and before that she will undergo scans with CT chest abdomen pelvis. If the scans do not show good response, we might do chemotherapy with carboplatin  and gemcitabine.  No orders of the defined types were placed in this encounter.   The patient has a good understanding of the overall plan. she agrees with it. she will call with any problems that may develop before the next visit here.   Rulon Eisenmenger, MD 08/17/2015

## 2015-08-17 NOTE — Patient Instructions (Signed)
Denosumab injection  What is this medicine?  DENOSUMAB (den oh sue mab) slows bone breakdown. Prolia is used to treat osteoporosis in women after menopause and in men. Xgeva is used to prevent bone fractures and other bone problems caused by cancer bone metastases. Xgeva is also used to treat giant cell tumor of the bone.  This medicine may be used for other purposes; ask your health care provider or pharmacist if you have questions.  What should I tell my health care provider before I take this medicine?  They need to know if you have any of these conditions:  -dental disease  -eczema  -infection or history of infections  -kidney disease or on dialysis  -low blood calcium or vitamin D  -malabsorption syndrome  -scheduled to have surgery or tooth extraction  -taking medicine that contains denosumab  -thyroid or parathyroid disease  -an unusual reaction to denosumab, other medicines, foods, dyes, or preservatives  -pregnant or trying to get pregnant  -breast-feeding  How should I use this medicine?  This medicine is for injection under the skin. It is given by a health care professional in a hospital or clinic setting.  If you are getting Prolia, a special MedGuide will be given to you by the pharmacist with each prescription and refill. Be sure to read this information carefully each time.  For Prolia, talk to your pediatrician regarding the use of this medicine in children. Special care may be needed. For Xgeva, talk to your pediatrician regarding the use of this medicine in children. While this drug may be prescribed for children as young as 13 years for selected conditions, precautions do apply.  Overdosage: If you think you have taken too much of this medicine contact a poison control center or emergency room at once.  NOTE: This medicine is only for you. Do not share this medicine with others.  What if I miss a dose?  It is important not to miss your dose. Call your doctor or health care professional if you are  unable to keep an appointment.  What may interact with this medicine?  Do not take this medicine with any of the following medications:  -other medicines containing denosumab  This medicine may also interact with the following medications:  -medicines that suppress the immune system  -medicines that treat cancer  -steroid medicines like prednisone or cortisone  This list may not describe all possible interactions. Give your health care provider a list of all the medicines, herbs, non-prescription drugs, or dietary supplements you use. Also tell them if you smoke, drink alcohol, or use illegal drugs. Some items may interact with your medicine.  What should I watch for while using this medicine?  Visit your doctor or health care professional for regular checks on your progress. Your doctor or health care professional may order blood tests and other tests to see how you are doing.  Call your doctor or health care professional if you get a cold or other infection while receiving this medicine. Do not treat yourself. This medicine may decrease your body's ability to fight infection.  You should make sure you get enough calcium and vitamin D while you are taking this medicine, unless your doctor tells you not to. Discuss the foods you eat and the vitamins you take with your health care professional.  See your dentist regularly. Brush and floss your teeth as directed. Before you have any dental work done, tell your dentist you are receiving this medicine.  Do   not become pregnant while taking this medicine or for 5 months after stopping it. Women should inform their doctor if they wish to become pregnant or think they might be pregnant. There is a potential for serious side effects to an unborn child. Talk to your health care professional or pharmacist for more information.  What side effects may I notice from receiving this medicine?  Side effects that you should report to your doctor or health care professional as soon as  possible:  -allergic reactions like skin rash, itching or hives, swelling of the face, lips, or tongue  -breathing problems  -chest pain  -fast, irregular heartbeat  -feeling faint or lightheaded, falls  -fever, chills, or any other sign of infection  -muscle spasms, tightening, or twitches  -numbness or tingling  -skin blisters or bumps, or is dry, peels, or red  -slow healing or unexplained pain in the mouth or jaw  -unusual bleeding or bruising  Side effects that usually do not require medical attention (Report these to your doctor or health care professional if they continue or are bothersome.):  -muscle pain  -stomach upset, gas  This list may not describe all possible side effects. Call your doctor for medical advice about side effects. You may report side effects to FDA at 1-800-FDA-1088.  Where should I keep my medicine?  This medicine is only given in a clinic, doctor's office, or other health care setting and will not be stored at home.  NOTE: This sheet is a summary. It may not cover all possible information. If you have questions about this medicine, talk to your doctor, pharmacist, or health care provider.      2016, Elsevier/Gold Standard. (2012-01-15 12:37:47)

## 2015-08-18 ENCOUNTER — Other Ambulatory Visit: Payer: Self-pay | Admitting: *Deleted

## 2015-08-18 ENCOUNTER — Telehealth: Payer: Self-pay | Admitting: Hematology and Oncology

## 2015-08-18 DIAGNOSIS — C50512 Malignant neoplasm of lower-outer quadrant of left female breast: Principal | ICD-10-CM

## 2015-08-18 DIAGNOSIS — C50511 Malignant neoplasm of lower-outer quadrant of right female breast: Secondary | ICD-10-CM

## 2015-08-18 MED ORDER — CALCIUM CARBONATE-VITAMIN D 500-400 MG-UNIT PO TABS: 1.0000 | ORAL_TABLET | Freq: Two times a day (BID) | ORAL | Status: DC

## 2015-08-18 NOTE — Telephone Encounter (Signed)
Called and left a message with new and cancelled appointments per pof

## 2015-08-19 ENCOUNTER — Other Ambulatory Visit: Payer: Self-pay | Admitting: Hematology and Oncology

## 2015-08-19 DIAGNOSIS — C50511 Malignant neoplasm of lower-outer quadrant of right female breast: Secondary | ICD-10-CM

## 2015-08-19 DIAGNOSIS — C50512 Malignant neoplasm of lower-outer quadrant of left female breast: Principal | ICD-10-CM

## 2015-08-19 NOTE — Telephone Encounter (Signed)
Patient called in to confirm next appointment and why 6 weeks vs 3 weeks. Checked last pof (1/17 2nd pof) and next visit should be 6 weeks lab/fu/inf/inf. Added inf to appointments as patient will also need zometa 2/28. Confirmed with patient next appointments should be 2/28 as scheduled - start time remains the same - patient has date/time.

## 2015-08-23 ENCOUNTER — Other Ambulatory Visit: Payer: Self-pay | Admitting: *Deleted

## 2015-08-23 ENCOUNTER — Telehealth: Payer: Self-pay | Admitting: *Deleted

## 2015-08-23 NOTE — Telephone Encounter (Signed)
"  [facility] pharmacy called about medications sent in for me.  I can't take anastrozole.  This didn't work for me and is why my cancer came back so I'm not sure why this was ordered.  I was told to throw this away.  Was the other medicine calcium?  I have some I bought OTC that is Calcium 600 mg with vitamin D3.  I want to take this because I have a full bottle.  I also feel this area under my arm is larger and he needs to decide what to do about it.  How long does it take for the chemotherapy to work?"   Will clarify with Doctor Gudena as anastrozole may be on automatic refill.  Calcium order is for calcium 500 mg and regular Vitamin D.  Central scheduling number provided to schedule scans needed before next F/U.

## 2015-08-23 NOTE — Telephone Encounter (Signed)
Patient advised to stop Anastrozole and she may take OTC Calcium with Vit D3. She has scheduled her scan.

## 2015-08-27 ENCOUNTER — Telehealth: Payer: Self-pay

## 2015-08-27 ENCOUNTER — Other Ambulatory Visit: Payer: Self-pay

## 2015-08-27 DIAGNOSIS — C50512 Malignant neoplasm of lower-outer quadrant of left female breast: Principal | ICD-10-CM

## 2015-08-27 DIAGNOSIS — C50511 Malignant neoplasm of lower-outer quadrant of right female breast: Secondary | ICD-10-CM

## 2015-08-27 MED ORDER — OXYCODONE-ACETAMINOPHEN 5-325 MG PO TABS: ORAL_TABLET | ORAL | Status: DC

## 2015-08-27 NOTE — Progress Notes (Signed)
Call report rcvd from [facility].  Reviewed with MD. Durene Cal to scan.   Let pt know Dr Lindi Adie wants to move her scan to an earlier date.  Pt voiced understanding.  Pt requested refill on oxycodone.  Scan rescheduled to 2/1 1030 am.  MD appt made for 2/3 at 1015 am.  Pt provided instructions for taking contrast and NPO after midnight.  Pt would like to get contrast from [facility].  [facility] called and notified.    Pt voiced understanding.

## 2015-08-27 NOTE — Telephone Encounter (Signed)
Pt states she has new numbness in both arms from shoulders to elbows. This has been going on for about a week. It is worsening. She can feel when she pinches herself and she can move both arms. She describes an example of decreased sensation if she brushes a doorframe when going from room to room. She states her daughter mixed her up a combination of blueberries, raspberries and kale for a drink at about the same time this started so she stopped drinking it. She has been using "a lot of advil", she takes oxycodone 1/2 tab every 4 hours for her L chest wall pain. She does not have any new swelling or increased pain in her L chest wall. She asked about wearing her compression sleeves and was advised to use them.  She is also requesting a oxycodone refill.

## 2015-08-30 ENCOUNTER — Other Ambulatory Visit: Payer: Self-pay | Admitting: General Surgery

## 2015-08-30 ENCOUNTER — Telehealth: Payer: Self-pay | Admitting: *Deleted

## 2015-08-30 NOTE — Telephone Encounter (Signed)
"  I need a refill on oxycodone 5-325 mg last filled 07-30-2015.  My son will pick this up today,  I have blue looking lines down my arms.  Are these veins?  They are blue-looking, run down my arms.  My scans are earlier on 09-01-2015 because numbness to my arms from shoulders to elbows and they're swollen."  Advised to elevate arms on arm rest of furniture or pillows, cool compresses and hydrate well.  Message left for collaborative about refill request.

## 2015-09-01 ENCOUNTER — Ambulatory Visit (HOSPITAL_COMMUNITY)
Admission: RE | Admit: 2015-09-01 | Discharge: 2015-09-01 | Disposition: A | Discharge status: Home / Self Care | Payer: Medicare Other | Source: Ambulatory Visit | Attending: Hematology and Oncology | Admitting: Hematology and Oncology

## 2015-09-01 ENCOUNTER — Encounter (HOSPITAL_COMMUNITY): Payer: Self-pay

## 2015-09-01 DIAGNOSIS — R222 Localized swelling, mass and lump, trunk: Secondary | ICD-10-CM | POA: Diagnosis not present

## 2015-09-01 DIAGNOSIS — C7800 Secondary malignant neoplasm of unspecified lung: Secondary | ICD-10-CM | POA: Diagnosis not present

## 2015-09-01 DIAGNOSIS — C50511 Malignant neoplasm of lower-outer quadrant of right female breast: Secondary | ICD-10-CM | POA: Insufficient documentation

## 2015-09-01 DIAGNOSIS — C3490 Malignant neoplasm of unspecified part of unspecified bronchus or lung: Secondary | ICD-10-CM | POA: Diagnosis present

## 2015-09-01 DIAGNOSIS — C771 Secondary and unspecified malignant neoplasm of intrathoracic lymph nodes: Secondary | ICD-10-CM | POA: Insufficient documentation

## 2015-09-01 DIAGNOSIS — C50512 Malignant neoplasm of lower-outer quadrant of left female breast: Secondary | ICD-10-CM | POA: Insufficient documentation

## 2015-09-01 MED ORDER — IOHEXOL 300 MG/ML  SOLN
100.0000 mL | Freq: Once | INTRAMUSCULAR | Status: AC | PRN
  Administered 2015-09-01: 100 mL via INTRAVENOUS

## 2015-09-02 NOTE — Assessment & Plan Note (Signed)
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 N1 M0 stage IIB  Radiology CT chest abdomen pelvis 05/28/2015: Developing subpectoral masses 3.5 cm and 4.5 cm. Four lung nodules the largest 1.4 cm in the right lung 1.7 cm sclerotic right iliac bone lesion concerning for metastatic disease Left subpectoral mass biopsy 06/21/2015: Invasive high-grade ductal carcinoma ER 5%, PR 0%, HER-2 negative ratio 1.29  Treatment plan: Because patient had failed antiestrogen therapy with anastrozole, I believe that the tumor even though is 5% estrogen receptor positive is behaving like ER negative disease. Xeloda 1500 mg by mouth twice a day started 07/07/2015 Bone metastases: Xgeva every 6 weeks with Calcium + D started January 2017 ---------------------------------------------------------------------------------------------------------------------------------------------------------- Current treatment: Xeloda 1500 mg by mouth twice a day started 07/07/2015 Xeloda toxicities: Denies any side effects of Xeloda. Denies any diarrhea or nausea or vomiting., Denies any mouth sores, denies any fatigue.  Return to clinic in 6 weeks for labs and X [place]  CT chest abdomen pelvis 09/01/15: Progressive left chest wall recurrence with an enlarging mass and severa; new sub cutaneous nodules. Progressive lung nodules If the scans do not show good response, we might do chemotherapy with carboplatin and gemcitabine.

## 2015-09-03 ENCOUNTER — Telehealth: Payer: Self-pay | Admitting: *Deleted

## 2015-09-03 ENCOUNTER — Encounter: Payer: Self-pay | Admitting: Hematology and Oncology

## 2015-09-03 ENCOUNTER — Ambulatory Visit (HOSPITAL_BASED_OUTPATIENT_CLINIC_OR_DEPARTMENT_OTHER): Payer: Medicare Other | Admitting: Hematology and Oncology

## 2015-09-03 VITALS — BP 113/74 | HR 87 | Temp 98.5°F | Resp 18 | Ht 64.0 in | Wt 139.1 lb

## 2015-09-03 DIAGNOSIS — C50919 Malignant neoplasm of unspecified site of unspecified female breast: Secondary | ICD-10-CM

## 2015-09-03 DIAGNOSIS — C7951 Secondary malignant neoplasm of bone: Secondary | ICD-10-CM | POA: Diagnosis not present

## 2015-09-03 DIAGNOSIS — D0512 Intraductal carcinoma in situ of left breast: Secondary | ICD-10-CM | POA: Diagnosis not present

## 2015-09-03 DIAGNOSIS — C50511 Malignant neoplasm of lower-outer quadrant of right female breast: Secondary | ICD-10-CM | POA: Diagnosis not present

## 2015-09-03 DIAGNOSIS — C50512 Malignant neoplasm of lower-outer quadrant of left female breast: Principal | ICD-10-CM

## 2015-09-03 DIAGNOSIS — C7889 Secondary malignant neoplasm of other digestive organs: Secondary | ICD-10-CM | POA: Diagnosis not present

## 2015-09-03 MED ORDER — ONDANSETRON HCL 8 MG PO TABS: 8.0000 mg | ORAL_TABLET | Freq: Two times a day (BID) | ORAL | Status: DC | PRN

## 2015-09-03 MED ORDER — LIDOCAINE-PRILOCAINE 2.5-2.5 % EX CREA: TOPICAL_CREAM | CUTANEOUS | Status: DC

## 2015-09-03 MED ORDER — PROCHLORPERAZINE MALEATE 10 MG PO TABS: 10.0000 mg | ORAL_TABLET | Freq: Four times a day (QID) | ORAL | Status: DC | PRN

## 2015-09-03 NOTE — Progress Notes (Signed)
Patient Care Team: [name], MD as PCP - General (Family Medicine) [name], MD (Cardiology)  DIAGNOSIS: Bilateral breast cancer [facility])   Staging form: Breast, AJCC 7th Edition     Clinical: Stage IIB (T2, N1, cM0) - Unsigned       Staging comments: Staged at breast conference 08/13/13     Pathologic: No stage assigned - Unsigned  SUMMARY OF ONCOLOGIC HISTORY:   Bilateral breast cancer ([place])   07/23/2013 Mammogram Bilateral breast masses. 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 N1 M0 stage IIB ER 100%, PR 0%, HER-2    06/17/2014 -  Radiation Therapy Adjuvant radiation therapy   09/14/2014 -  Anti-estrogen oral therapy Anastrozole 1 mg daily   05/28/2015 Imaging CT scans: Enlarging subpectoral masses 3.1 x 3.5 cm, posteriorly lower density mass 4.5 x 2.1 cm, several right-sided lung nodules right lower lobe 1.4 cm, 3 other right lung nodules, 1.7 cm right iliac bone lesion   06/21/2015 Procedure Left subpectoral mass biopsy: Invasive high-grade ductal  carcinoma ER 5%, PR 0%, HER-2 negative ratio 1.29   09/01/2015 Imaging Left chest wall mass increased in size 7.2 x 5.1 cm, multiple subcutaneous nodules, increase in the lung nodules both in number as well as in the size of existing nodules    CHIEF COMPLIANT: Patient is here to review the results of the scans  INTERVAL HISTORY: Paula Navarro is a 65 year old with above-mentioned history of metastatic breast cancer with a very large subpectoral mass who was on Xeloda and underwent CT scans. She is here for a follow-up after scans. She feels that the tumor is rapidly increasing in size. She feels Xeloda is not working. She did not have any major side effects to Xeloda. She did not have any diarrhea or handfoot syndrome.  REVIEW OF SYSTEMS:   Constitutional: Denies fevers, chills or abnormal weight loss Eyes: Denies blurriness of vision Ears, nose, mouth, throat, and face: Denies mucositis or sore throat Respiratory: Denies cough, dyspnea or wheezes Cardiovascular: Denies palpitation, chest discomfort Gastrointestinal:  Denies nausea, heartburn or change in bowel habits Skin: Denies abnormal skin rashes Lymphatics: Denies new lymphadenopathy or easy bruising Neurological:Denies numbness, tingling or new weaknesses Behavioral/Psych: Mood is stable, no new changes  Extremities: No lower extremity edema Breast: Large left chest wall mass extending from the left upper outer quadrant into the axilla All other systems were reviewed with the patient and are negative.  I have reviewed the past medical history, past surgical history, social history and family history with  the patient and they are unchanged from previous note.  ALLERGIES:  is allergic to codeine and morphine and related.  MEDICATIONS:  Current Outpatient Prescriptions  Medication Sig Dispense Refill  . B Complex-C (SUPER B COMPLEX PO) Take 1 tablet by mouth daily.    . calcium-vitamin D (OSCAL-500) 500-400 MG-UNIT tablet Take 1  tablet by mouth 2 (two) times daily. 60 tablet 3  . capecitabine (XELODA) 500 MG tablet Take 3 tablets (1,500 mg total) by mouth 2 (two) times daily after a meal. 84 tablet 3  . gabapentin (NEURONTIN) 300 MG capsule TAKE 2 CAPSULES (600 MG TOTAL) BY MOUTH 3 (THREE) TIMES DAILY. 180 capsule 5  . lidocaine-prilocaine (EMLA) cream Apply to affected area once 30 g 3  . LORazepam (ATIVAN) 1 MG tablet     . ondansetron (ZOFRAN) 8 MG tablet Take 1 tablet (8 mg total) by mouth 2 (two) times daily as needed for refractory nausea / vomiting. Start on day 3 after carboplatin chemo. 30 tablet 1  . oxyCODONE-acetaminophen (PERCOCET/ROXICET) 5-325 MG tablet 1 tabs PO q6h prn pain 60 tablet 0  . prochlorperazine (COMPAZINE) 10 MG tablet Take 1 tablet (10 mg total) by mouth every 6 (six) hours as needed (Nausea or vomiting). 30 tablet 1   No current facility-administered medications for this visit.    PHYSICAL EXAMINATION: ECOG PERFORMANCE STATUS: 1 - Symptomatic but completely ambulatory  Filed Vitals:   09/03/15 1010  BP: 113/74  Pulse: 87  Temp: 98.5 F (36.9 C)  Resp: 18   Filed Weights   09/03/15 1010  Weight: 139 lb 1.6 oz (63.095 kg)    GENERAL:alert, no distress and comfortable SKIN: skin color, texture, turgor are normal, no rashes or significant lesions EYES: normal, Conjunctiva are pink and non-injected, sclera clear OROPHARYNX:no exudate, no erythema and lips, buccal mucosa, and tongue normal  NECK: supple, thyroid normal size, non-tender, without nodularity LYMPH:  no palpable lymphadenopathy in the cervical, axillary or inguinal LUNGS: clear to auscultation and percussion with normal breathing effort HEART: regular rate & rhythm and no murmurs and no lower extremity edema ABDOMEN:abdomen soft, non-tender and normal bowel sounds MUSCULOSKELETAL:no cyanosis of digits and no clubbing  NEURO: alert & oriented x 3 with fluent speech, no focal motor/sensory deficits EXTREMITIES: No  lower extremity edema BREAST: Very large mass in the left upper chest wall extending into the axilla. It is not tender. Some evidence of venous engorgement. (exam performed in the presence of a chaperone)  LABORATORY DATA:  I have reviewed the data as listed   Chemistry      Component Value Date/Time   NA 138 08/17/2015 1008   NA 133* 01/07/2015 1409   K 4.2 08/17/2015 1008   K 4.3 01/07/2015 1409   CL 95* 01/07/2015 1409   CO2 26 08/17/2015 1008   CO2 26 01/07/2015 1409   BUN 12.3 08/17/2015 1008   BUN 10 01/07/2015 1409   CREATININE 0.9 08/17/2015 1008   CREATININE 0.79 01/07/2015 1409      Component Value Date/Time   CALCIUM 9.9 08/17/2015 1008   CALCIUM 9.3 01/07/2015 1409   ALKPHOS 57 08/17/2015 1008   AST 42* 08/17/2015 1008   ALT 34 08/17/2015 1008   BILITOT 1.06 08/17/2015 1008       Lab Results  Component Value Date   WBC 9.1 08/17/2015   HGB 11.2* 08/17/2015   HCT 36.2 08/17/2015   MCV 74.5* 08/17/2015   PLT 250 08/17/2015   NEUTROABS 5.7 08/17/2015  ASSESSMENT & PLAN:  Bilateral breast cancer ([place]) Treated with neoadjuvant chemotherapy followed by surgery 04/08/2014, radiation treatment completed 07/17/2014, antiestrogen therapy with anastrozole 09/14/2014 Left breast T2, N0, M0, IDC grade 3; 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 N1 M0 stage IIB  Radiology CT chest abdomen pelvis 05/28/2015: Developing subpectoral masses 3.5 cm and 4.5 cm. Four lung nodules the largest 1.4 cm in the right lung 1.7 cm sclerotic right iliac bone lesion concerning for metastatic disease Left subpectoral mass biopsy 06/21/2015: Invasive high-grade ductal carcinoma ER 5%, PR 0%, HER-2 negative ratio 1.29 Prior treatment: Xeloda 1500 mg by mouth twice a day started 07/07/2015 stopped 09/03/2015 (stopped for  progression)  Bone metastases: Xgeva every 6 weeks with Calcium + D started January 2017 ---------------------------------------------------------------------------------------------------------------------------------------------------------- Radiology review CT chest abdomen pelvis 09/01/15: Progressive left chest wall recurrence with an enlarging mass and severa; new sub cutaneous nodules. Progressive lung nodules  Treatment plan: Carboplatin day 1 and gemcitabine days 1 and 8 every 3 weeks with Neulasta to start 09/10/2015 I will request Dr. Marlou Starks for a port placement prior to that I counseled her about chemotherapy risks and benefits. Risks include nausea, cytopenias, risk of infection, anemia, neuropathy, hair thinning, long-term bone marrow damage etc. patient understands these risks and is willing and consenting to proceed with the treatment.  Return to clinic on day 1 of chemotherapy. Orders Placed This Encounter  Procedures  . CBC with Differential    Standing Status: Standing     Number of Occurrences: 20     Standing Expiration Date: 09/03/2016  . Comprehensive metabolic panel    Standing Status: Standing     Number of Occurrences: 20     Standing Expiration Date: 09/03/2016   The patient has a good understanding of the overall plan. she agrees with it. she will call with any problems that may develop before the next visit here.   Rulon Eisenmenger, MD 09/03/2015

## 2015-09-03 NOTE — Telephone Encounter (Signed)
"  I have a cold with cough with tinge green, stuffiness and need an antibiotic."  Was seen today asked if has developed a fever.  Denies fever.  Instructed to treat symptoms.  Has robitussin cough syrup on hand.  Encouraged to FF and try Mucinex DM.  Call if fever develops and symptoms do not improve and/or worsen.

## 2015-09-06 ENCOUNTER — Other Ambulatory Visit (HOSPITAL_COMMUNITY): Payer: Self-pay | Admitting: General Surgery

## 2015-09-06 ENCOUNTER — Other Ambulatory Visit: Payer: Self-pay | Admitting: *Deleted

## 2015-09-06 ENCOUNTER — Other Ambulatory Visit: Payer: Self-pay | Admitting: General Surgery

## 2015-09-06 ENCOUNTER — Other Ambulatory Visit: Payer: Self-pay

## 2015-09-06 ENCOUNTER — Encounter (HOSPITAL_COMMUNITY): Payer: Self-pay | Admitting: *Deleted

## 2015-09-06 ENCOUNTER — Other Ambulatory Visit: Payer: Self-pay | Admitting: Radiology

## 2015-09-06 ENCOUNTER — Emergency Department (HOSPITAL_COMMUNITY)
Admission: EM | Admit: 2015-09-06 | Discharge: 2015-09-06 | Disposition: A | Discharge status: Home / Self Care | Payer: Medicare Other | Attending: Emergency Medicine | Admitting: Emergency Medicine

## 2015-09-06 ENCOUNTER — Telehealth: Payer: Self-pay | Admitting: *Deleted

## 2015-09-06 ENCOUNTER — Emergency Department (HOSPITAL_COMMUNITY): Payer: Medicare Other

## 2015-09-06 DIAGNOSIS — Z87891 Personal history of nicotine dependence: Secondary | ICD-10-CM | POA: Insufficient documentation

## 2015-09-06 DIAGNOSIS — Z79899 Other long term (current) drug therapy: Secondary | ICD-10-CM | POA: Insufficient documentation

## 2015-09-06 DIAGNOSIS — R222 Localized swelling, mass and lump, trunk: Secondary | ICD-10-CM | POA: Insufficient documentation

## 2015-09-06 DIAGNOSIS — Z8774 Personal history of (corrected) congenital malformations of heart and circulatory system: Secondary | ICD-10-CM | POA: Diagnosis not present

## 2015-09-06 DIAGNOSIS — C50412 Malignant neoplasm of upper-outer quadrant of left female breast: Secondary | ICD-10-CM

## 2015-09-06 DIAGNOSIS — Z862 Personal history of diseases of the blood and blood-forming organs and certain disorders involving the immune mechanism: Secondary | ICD-10-CM | POA: Insufficient documentation

## 2015-09-06 DIAGNOSIS — J441 Chronic obstructive pulmonary disease with (acute) exacerbation: Secondary | ICD-10-CM | POA: Diagnosis not present

## 2015-09-06 DIAGNOSIS — G8929 Other chronic pain: Secondary | ICD-10-CM | POA: Diagnosis not present

## 2015-09-06 DIAGNOSIS — Z972 Presence of dental prosthetic device (complete) (partial): Secondary | ICD-10-CM | POA: Insufficient documentation

## 2015-09-06 DIAGNOSIS — Z853 Personal history of malignant neoplasm of breast: Secondary | ICD-10-CM | POA: Insufficient documentation

## 2015-09-06 DIAGNOSIS — R0602 Shortness of breath: Secondary | ICD-10-CM | POA: Diagnosis present

## 2015-09-06 DIAGNOSIS — J40 Bronchitis, not specified as acute or chronic: Secondary | ICD-10-CM

## 2015-09-06 MED ORDER — PREDNISONE 20 MG PO TABS: ORAL_TABLET | ORAL | Status: DC

## 2015-09-06 MED ORDER — HYDROCODONE-ACETAMINOPHEN 5-325 MG PO TABS
1.0000 | ORAL_TABLET | Freq: Once | ORAL | Status: DC
  Filled 2015-09-06: qty 1

## 2015-09-06 MED ORDER — HYDROCODONE-ACETAMINOPHEN 5-325 MG PO TABS
ORAL_TABLET | ORAL | Status: DC
Start: 2015-09-06 — End: 2015-09-06
  Filled 2015-09-06: qty 1

## 2015-09-06 MED ORDER — ALBUTEROL SULFATE HFA 108 (90 BASE) MCG/ACT IN AERS: 1.0000 | INHALATION_SPRAY | Freq: Four times a day (QID) | RESPIRATORY_TRACT | Status: DC | PRN

## 2015-09-06 MED ORDER — IPRATROPIUM-ALBUTEROL 0.5-2.5 (3) MG/3ML IN SOLN
3.0000 mL | Freq: Once | RESPIRATORY_TRACT | Status: AC
  Administered 2015-09-06: 3 mL via RESPIRATORY_TRACT
  Filled 2015-09-06: qty 3

## 2015-09-06 MED ORDER — AZITHROMYCIN 250 MG PO TABS: ORAL_TABLET | ORAL | Status: DC

## 2015-09-06 MED ORDER — ALBUTEROL SULFATE (2.5 MG/3ML) 0.083% IN NEBU
2.5000 mg | INHALATION_SOLUTION | Freq: Once | RESPIRATORY_TRACT | Status: AC
  Administered 2015-09-06: 2.5 mg via RESPIRATORY_TRACT
  Filled 2015-09-06: qty 3

## 2015-09-06 NOTE — ED Notes (Signed)
Pt c/o SOB, it worsened last night, EMS came out but patient did come to hospital. Pt. Reports hx of breast CA with mets to lungs.

## 2015-09-06 NOTE — Telephone Encounter (Signed)
Received telephone advice record from [facility], sent to scan. Patient was seen in ED on 2/6.

## 2015-09-06 NOTE — ED Provider Notes (Signed)
CSN: [id number]     Arrival date & time 09/06/15  1039 History  By signing my name below, I, Paula Navarro, attest that this documentation has been prepared under the direction and in the presence of Milton Ferguson, MD. Electronically Signed: [name], ED Scribe. 09/06/2015. 12:27 PM.    Chief Complaint  Patient presents with  . Shortness of Breath      Patient is a 65 y.o. female presenting with shortness of breath. The history is provided by the patient. No language interpreter was used.  Shortness of Breath Severity:  Moderate Onset quality:  Gradual Duration:  14 hours Progression:  Worsening Chronicity:  New Relieved by:  None tried Worsened by:  Nothing tried Ineffective treatments:  None tried Associated symptoms: cough   Associated symptoms: no abdominal pain, no chest pain, no headaches and no rash    HPI Comments: Paula Navarro is a 65 y.o. female who presents to the Emergency Department complaining of constant, worsening SOB that has been present since last night. Pt also reports an associated cough that began three days ago. Pt denies taking any medication to treat her symptoms. Pt denies fever or chills.   Past Medical History  Diagnosis Date  . Chronic bronchitis   . Palpitation     Tachycardia reported by monitor clerk during a symptomatic spell  . Chest pain     Admitted to [facility] in 09/2011; refused stress test  . Atrial septal defect 1996    Surgical repair in 1996  . Tobacco abuse     60 pack years; 1.5 packs per day  . Anxiety   . Anemia   . Breast cancer ([place])   . Wears dentures     top  . COPD (chronic obstructive pulmonary disease) ([place])     on xray  . Chronic pain   . Radiation 06/30/14-08/17/14    Bilateral Breast   Past Surgical History  Procedure Laterality Date  . Cholecystectomy    . Cesarean section      x3  . Tubal ligation    . Asd repair, ostium primum  1996    dr Roxy Horseman  . Port a cath revision  1/15    put in   . Breast  lumpectomy with radioactive seed localization Bilateral 04/08/2014    Procedure: BILATERAL  RADIOACTIVE SEED LOCALIZATION LUMPECTOMY ;  Surgeon: Autumn Messing III, MD;  Location: [place];  Service: General;  Laterality: Bilateral;  . Axillary lymph node dissection Bilateral 04/08/2014    Procedure:  BILATERAL AXILLARY LYMPH NODE DISSECTION;  Surgeon: Autumn Messing III, MD;  Location: [place];  Service: General;  Laterality: Bilateral;  . Open heart surgery    . Breast biopsy Bilateral    Family History  Problem Relation Age of Onset  . Breast cancer Maternal Aunt    Social History  Substance Use Topics  . Smoking status: Former Smoker -- 1.50 packs/day for 40 years    Types: Cigarettes    Quit date: 08/01/2012  . Smokeless tobacco: Never Used  . Alcohol Use: No   OB History    Gravida Para Term Preterm AB TAB SAB Ectopic Multiple Living   '4 3 3            '$ Review of Systems  Constitutional: Negative for chills, appetite change and fatigue.  HENT: Negative for congestion, ear discharge and sinus pressure.   Eyes: Negative for discharge.  Respiratory: Positive for cough and shortness of  breath.   Cardiovascular: Negative for chest pain.  Gastrointestinal: Negative for abdominal pain and diarrhea.  Genitourinary: Negative for frequency and hematuria.  Musculoskeletal: Negative for back pain.  Skin: Negative for rash.  Neurological: Negative for seizures and headaches.  Psychiatric/Behavioral: Negative for hallucinations.      Allergies  Codeine and Morphine and related  Home Medications   Prior to Admission medications   Medication Sig Start Date End Date Taking? Authorizing Provider  B Complex-C (SUPER B COMPLEX PO) Take 1 tablet by mouth daily.   Yes Historical Provider, MD  calcium-vitamin D (OSCAL-500) 500-400 MG-UNIT tablet Take 1 tablet by mouth 2 (two) times daily. 08/18/15  Yes Nicholas Lose, MD  gabapentin (NEURONTIN) 300 MG capsule TAKE 2  CAPSULES (600 MG TOTAL) BY MOUTH 3 (THREE) TIMES DAILY. 06/14/15  Yes Nicholas Lose, MD  guaiFENesin-dextromethorphan (ROBITUSSIN DM) 100-10 MG/5ML syrup Take 5 mLs by mouth every 4 (four) hours as needed for cough.   Yes Historical Provider, MD  LORazepam (ATIVAN) 1 MG tablet Take 1 mg by mouth at bedtime.  06/17/15  Yes Historical Provider, MD  oxyCODONE-acetaminophen (PERCOCET/ROXICET) 5-325 MG tablet 1 tabs PO q6h prn pain Patient taking differently: Take 0.5-1 tablets by mouth every 6 (six) hours as needed for moderate pain. 1 tabs PO q6h prn pain 08/27/15  Yes Nicholas Lose, MD  capecitabine (XELODA) 500 MG tablet Take 3 tablets (1,500 mg total) by mouth 2 (two) times daily after a meal. Patient not taking: Reported on 09/06/2015 06/29/15   Nicholas Lose, MD  lidocaine-prilocaine (EMLA) cream Apply to affected area once Patient taking differently: Apply 1 application topically as needed (has not started). Apply to affected area once 09/03/15   Nicholas Lose, MD  ondansetron (ZOFRAN) 8 MG tablet Take 1 tablet (8 mg total) by mouth 2 (two) times daily as needed for refractory nausea / vomiting. Start on day 3 after carboplatin chemo. 09/03/15   Nicholas Lose, MD  prochlorperazine (COMPAZINE) 10 MG tablet Take 1 tablet (10 mg total) by mouth every 6 (six) hours as needed (Nausea or vomiting). 09/03/15   Nicholas Lose, MD   BP 123/68 mmHg  Pulse 78  Temp(Src) 99.4 F (37.4 C) (Tympanic)  Resp 17  Ht '5\' 4"'$  (1.626 m)  Wt 137 lb (62.143 kg)  BMI 23.50 kg/m2  SpO2 97%  LMP 05/20/2011 Physical Exam  Constitutional: She is oriented to person, place, and time. She appears well-developed.  HENT:  Head: Normocephalic.  Eyes: Conjunctivae and EOM are normal. No scleral icterus.  Neck: Neck supple. No thyromegaly present.  Cardiovascular: Normal rate and regular rhythm.  Exam reveals no gallop and no friction rub.   No murmur heard. Pulmonary/Chest: No stridor. She has wheezes. She has no rales. She exhibits  no tenderness.  Mild wheezing throughout Soft tissue mass 3 cm in diameter left upper chest  Abdominal: She exhibits no distension. There is no tenderness. There is no rebound.  Musculoskeletal: Normal range of motion. She exhibits no edema.  Lymphadenopathy:    She has no cervical adenopathy.  Neurological: She is oriented to person, place, and time. She exhibits normal muscle tone. Coordination normal.  Skin: No rash noted. No erythema.  Psychiatric: She has a normal mood and affect. Her behavior is normal.    ED Course  Procedures (including critical care time)  DIAGNOSTIC STUDIES: Oxygen Saturation is 95% on room air, normal by my interpretation.    COORDINATION OF CARE: 12:17 PM Discussed treatment plan with pt at  bedside and pt agreed to plan.   Labs Review Labs Reviewed - No data to display  Imaging Review Dg Chest 2 View  09/06/2015  CLINICAL DATA:  Shortness of breath. History of breast carcinoma with known lung metastases EXAM: CHEST  2 VIEW COMPARISON:  Chest CT September 01, 2015; chest radiograph February 07, 2014 FINDINGS: Multiple pulmonary nodular lesions apparent, not felt to be changed from recent prior CT examination. The largest of these nodular lesions is in the right middle lobe, measuring 2.1 x 1.8 cm. There is no frank edema or consolidation. No new opacity. Heart size and pulmonary vascularity are normal. No adenopathy is evident. There are surgical clips in both axillary regions. The known large left chest wall mass seen by CT is not well seen by CT. There old healed rib fractures on the left. IMPRESSION: Stable appearing pulmonary nodular lesions. Surgical clips in each axilla. Known left chest wall lesion not well seen radiographically. No new lesion identified compared to recent CT examination. Electronically Signed   By: Lowella Grip III M.D.   On: 09/06/2015 11:53   I have personally reviewed and evaluated these images and lab results as part of my medical  decision-making.   EKG Interpretation None      MDM   Final diagnoses:  None    Bronchitis and bronchospasm. Patient will be treated with Z-Pak prednisone albuterol and follow-up with PCP as needed  The chart was scribed for me under my direct supervision.  I personally performed the history, physical, and medical decision making and all procedures in the evaluation of this patient.Milton Ferguson, MD 09/06/15 [phone]

## 2015-09-06 NOTE — ED Notes (Signed)
Went to medicate pt as ordered and she stated that she does not want the medication until she can eat lunch.

## 2015-09-06 NOTE — Discharge Instructions (Signed)
Follow-up with their family doctor in 2-3 days if not improving

## 2015-09-07 ENCOUNTER — Telehealth: Payer: Self-pay | Admitting: Hematology and Oncology

## 2015-09-07 ENCOUNTER — Ambulatory Visit (HOSPITAL_COMMUNITY): Payer: Medicare Other

## 2015-09-07 ENCOUNTER — Other Ambulatory Visit: Payer: Self-pay | Admitting: General Surgery

## 2015-09-07 ENCOUNTER — Ambulatory Visit: Payer: Medicare Other

## 2015-09-07 ENCOUNTER — Telehealth: Payer: Self-pay | Admitting: *Deleted

## 2015-09-07 ENCOUNTER — Other Ambulatory Visit: Payer: Medicare Other

## 2015-09-07 NOTE — Telephone Encounter (Signed)
Spoke with patient re next appointment for 2/10 - patient will get new schedule 2/10.

## 2015-09-07 NOTE — Telephone Encounter (Signed)
Per staff message and POF I have scheduled appts. Advised scheduler of appts. JMW  

## 2015-09-08 ENCOUNTER — Other Ambulatory Visit: Payer: Self-pay | Admitting: Radiology

## 2015-09-09 ENCOUNTER — Ambulatory Visit (HOSPITAL_COMMUNITY)
Admission: RE | Admit: 2015-09-09 | Discharge: 2015-09-09 | Disposition: A | Discharge status: Home / Self Care | Payer: Medicare Other | Source: Ambulatory Visit | Attending: General Surgery | Admitting: General Surgery

## 2015-09-09 ENCOUNTER — Other Ambulatory Visit (HOSPITAL_COMMUNITY): Payer: Self-pay | Admitting: General Surgery

## 2015-09-09 ENCOUNTER — Encounter (HOSPITAL_COMMUNITY): Payer: Self-pay

## 2015-09-09 DIAGNOSIS — J40 Bronchitis, not specified as acute or chronic: Secondary | ICD-10-CM | POA: Diagnosis not present

## 2015-09-09 DIAGNOSIS — G8929 Other chronic pain: Secondary | ICD-10-CM | POA: Diagnosis not present

## 2015-09-09 DIAGNOSIS — Z87891 Personal history of nicotine dependence: Secondary | ICD-10-CM | POA: Insufficient documentation

## 2015-09-09 DIAGNOSIS — R918 Other nonspecific abnormal finding of lung field: Secondary | ICD-10-CM | POA: Insufficient documentation

## 2015-09-09 DIAGNOSIS — Z79899 Other long term (current) drug therapy: Secondary | ICD-10-CM | POA: Insufficient documentation

## 2015-09-09 DIAGNOSIS — Z803 Family history of malignant neoplasm of breast: Secondary | ICD-10-CM | POA: Insufficient documentation

## 2015-09-09 DIAGNOSIS — Z923 Personal history of irradiation: Secondary | ICD-10-CM | POA: Insufficient documentation

## 2015-09-09 DIAGNOSIS — C50412 Malignant neoplasm of upper-outer quadrant of left female breast: Secondary | ICD-10-CM

## 2015-09-09 DIAGNOSIS — C50919 Malignant neoplasm of unspecified site of unspecified female breast: Secondary | ICD-10-CM | POA: Insufficient documentation

## 2015-09-09 LAB — CBC
HEMATOCRIT: 35 % — AB (ref 36.0–46.0)
HEMOGLOBIN: 10.9 g/dL — AB (ref 12.0–15.0)
MCH: 24 pg — AB (ref 26.0–34.0)
MCHC: 31.1 g/dL (ref 30.0–36.0)
MCV: 77.1 fL — AB (ref 78.0–100.0)
PLATELETS: 285 10*3/uL (ref 150–400)
RBC: 4.54 MIL/uL (ref 3.87–5.11)
RDW: 18.7 % — ABNORMAL HIGH (ref 11.5–15.5)
WBC: 13.7 10*3/uL — ABNORMAL HIGH (ref 4.0–10.5)

## 2015-09-09 LAB — APTT: aPTT: 24 seconds (ref 24–37)

## 2015-09-09 LAB — PROTIME-INR
INR: 1.12 (ref 0.00–1.49)
Prothrombin Time: 14.2 seconds (ref 11.6–15.2)

## 2015-09-09 MED ORDER — HEPARIN SOD (PORK) LOCK FLUSH 100 UNIT/ML IV SOLN
INTRAVENOUS | Status: AC | PRN
  Administered 2015-09-09: 500 [IU]

## 2015-09-09 MED ORDER — MIDAZOLAM HCL 2 MG/2ML IJ SOLN
INTRAMUSCULAR | Status: AC
  Filled 2015-09-09: qty 6

## 2015-09-09 MED ORDER — FENTANYL CITRATE (PF) 100 MCG/2ML IJ SOLN
INTRAMUSCULAR | Status: AC
  Filled 2015-09-09: qty 4

## 2015-09-09 MED ORDER — MIDAZOLAM HCL 2 MG/2ML IJ SOLN
INTRAMUSCULAR | Status: AC | PRN
  Administered 2015-09-09 (×4): 1 mg via INTRAVENOUS

## 2015-09-09 MED ORDER — SODIUM CHLORIDE 0.9 % IV SOLN: INTRAVENOUS | Status: DC

## 2015-09-09 MED ORDER — LIDOCAINE-EPINEPHRINE 2 %-1:100000 IJ SOLN
INTRAMUSCULAR | Status: AC
  Filled 2015-09-09: qty 1

## 2015-09-09 MED ORDER — FENTANYL CITRATE (PF) 100 MCG/2ML IJ SOLN
INTRAMUSCULAR | Status: AC | PRN
  Administered 2015-09-09: 50 ug via INTRAVENOUS
  Administered 2015-09-09 (×2): 25 ug via INTRAVENOUS

## 2015-09-09 MED ORDER — HEPARIN SOD (PORK) LOCK FLUSH 100 UNIT/ML IV SOLN
INTRAVENOUS | Status: AC
  Filled 2015-09-09: qty 5

## 2015-09-09 MED ORDER — CEFAZOLIN SODIUM-DEXTROSE 2-3 GM-% IV SOLR
2.0000 g | INTRAVENOUS | Status: AC
  Administered 2015-09-09: 2 g via INTRAVENOUS

## 2015-09-09 MED ORDER — CEFAZOLIN SODIUM-DEXTROSE 2-3 GM-% IV SOLR
INTRAVENOUS | Status: AC
  Administered 2015-09-09: 2 g via INTRAVENOUS
  Filled 2015-09-09: qty 50

## 2015-09-09 NOTE — Assessment & Plan Note (Signed)
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 N1 M0 stage IIB  Radiology CT chest abdomen pelvis 05/28/2015: Developing subpectoral masses 3.5 cm and 4.5 cm. Four lung nodules the largest 1.4 cm in the right lung 1.7 cm sclerotic right iliac bone lesion concerning for metastatic disease Left subpectoral mass biopsy 06/21/2015: Invasive high-grade ductal carcinoma ER 5%, PR 0%, HER-2 negative ratio 1.29 Prior treatment: Xeloda 1500 mg by mouth twice a day started 07/07/2015 stopped 09/03/2015 (stopped for progression)  Bone metastases: Xgeva every 6 weeks with Calcium + D started January 2017 CT chest abdomen pelvis 09/01/15: Progressive left chest wall recurrence with an enlarging mass and severa; new sub cutaneous nodules. Progressive lung nodules -------------------------------------------------------------------------------------------------------------------------- Treatment plan: Carboplatin day 1 and gemcitabine days 1 and 8 every 3 weeks with Neulasta started 09/10/2015  RTC in 1 week for cycle 1 day 8

## 2015-09-09 NOTE — Sedation Documentation (Signed)
Patient denies pain and is resting comfortably.  

## 2015-09-09 NOTE — Discharge Instructions (Signed)
Implanted Port Insertion, Care After °Refer to this sheet in the next few weeks. These instructions provide you with information on caring for yourself after your procedure. Your health care provider may also give you more specific instructions. Your treatment has been planned according to current medical practices, but problems sometimes occur. Call your health care provider if you have any problems or questions after your procedure. °WHAT TO EXPECT AFTER THE PROCEDURE °After your procedure, it is typical to have the following:  °· Discomfort at the port insertion site. Ice packs to the area will help. °· Bruising on the skin over the port. This will subside in 3-4 days. °HOME CARE INSTRUCTIONS °· After your port is placed, you will get a manufacturer's information card. The card has information about your port. Keep this card with you at all times.   °· Know what kind of port you have. There are many types of ports available.   °· Wear a medical alert bracelet in case of an emergency. This can help alert health care workers that you have a port.   °· The port can stay in for as long as your health care provider believes it is necessary.   °· A home health care nurse may give medicines and take care of the port.   °· You or a family member can get special training and directions for giving medicine and taking care of the port at home.   °SEEK MEDICAL CARE IF:  °· Your port does not flush or you are unable to get a blood return.   °· You have a fever or chills. °SEEK IMMEDIATE MEDICAL CARE IF: °· You have new fluid or pus coming from your incision.   °· You notice a bad smell coming from your incision site.   °· You have swelling, pain, or more redness at the incision or port site.   °· You have chest pain or shortness of breath. °  °This information is not intended to replace advice given to you by your health care provider. Make sure you discuss any questions you have with your health care provider. °  °Document  Released: 05/07/2013 Document Revised: 07/22/2013 Document Reviewed: 05/07/2013 °Elsevier Interactive Patient Education ©2016 [employer] °Implanted Port Home Guide °An implanted port is a type of central line that is placed under the skin. Central lines are used to provide IV access when treatment or nutrition needs to be given through a person's veins. Implanted ports are used for long-term IV access. An implanted port may be placed because:  °· You need IV medicine that would be irritating to the small veins in your hands or arms.   °· You need long-term IV medicines, such as antibiotics.   °· You need IV nutrition for a long period.   °· You need frequent blood draws for lab tests.   °· You need dialysis.   °Implanted ports are usually placed in the chest area, but they can also be placed in the upper arm, the abdomen, or the leg. An implanted port has two main parts:  °· Reservoir. The reservoir is round and will appear as a small, raised area under your skin. The reservoir is the part where a needle is inserted to give medicines or draw blood.   °· Catheter. The catheter is a thin, flexible tube that extends from the reservoir. The catheter is placed into a large vein. Medicine that is inserted into the reservoir goes into the catheter and then into the vein.   °HOW WILL I CARE FOR MY INCISION SITE? °Do not get the   incision site wet. Bathe or shower as directed by your health care provider.  °HOW IS MY PORT ACCESSED? °Special steps must be taken to access the port:  °· Before the port is accessed, a numbing cream can be placed on the skin. This helps numb the skin over the port site.   °· Your health care provider uses a sterile technique to access the port. °· Your health care provider must put on a mask and sterile gloves. °· The skin over your port is cleaned carefully with an antiseptic and allowed to dry. °· The port is gently pinched between sterile gloves, and a needle is inserted into the  port. °· Only "non-coring" port needles should be used to access the port. Once the port is accessed, a blood return should be checked. This helps ensure that the port is in the vein and is not clogged.   °· If your port needs to remain accessed for a constant infusion, a clear (transparent) bandage will be placed over the needle site. The bandage and needle will need to be changed every week, or as directed by your health care provider.   °· Keep the bandage covering the needle clean and dry. Do not get it wet. Follow your health care provider's instructions on how to take a shower or bath while the port is accessed.   °· If your port does not need to stay accessed, no bandage is needed over the port.   °WHAT IS FLUSHING? °Flushing helps keep the port from getting clogged. Follow your health care provider's instructions on how and when to flush the port. Ports are usually flushed with saline solution or a medicine called heparin. The need for flushing will depend on how the port is used.  °· If the port is used for intermittent medicines or blood draws, the port will need to be flushed:   °· After medicines have been given.   °· After blood has been drawn.   °· As part of routine maintenance.   °· If a constant infusion is running, the port may not need to be flushed.   °HOW LONG WILL MY PORT STAY IMPLANTED? °The port can stay in for as long as your health care provider thinks it is needed. When it is time for the port to come out, surgery will be done to remove it. The procedure is similar to the one performed when the port was put in.  °WHEN SHOULD I SEEK IMMEDIATE MEDICAL CARE? °When you have an implanted port, you should seek immediate medical care if:  °· You notice a bad smell coming from the incision site.   °· You have swelling, redness, or drainage at the incision site.   °· You have more swelling or pain at the port site or the surrounding area.   °· You have a fever that is not controlled with  medicine. °  °This information is not intended to replace advice given to you by your health care provider. Make sure you discuss any questions you have with your health care provider. °  °Document Released: 07/17/2005 Document Revised: 05/07/2013 Document Reviewed: 03/24/2013 °Elsevier Interactive Patient Education ©2016 [employer] ° ° °Moderate Conscious Sedation, Adult, Care After °Refer to this sheet in the next few weeks. These instructions provide you with information on caring for yourself after your procedure. Your health care provider may also give you more specific instructions. Your treatment has been planned according to current medical practices, but problems sometimes occur. Call your health care provider if you have any problems or questions   after your procedure. °WHAT TO EXPECT AFTER THE PROCEDURE  °After your procedure: °· You may feel sleepy, clumsy, and have poor balance for several hours. °· Vomiting may occur if you eat too soon after the procedure. °HOME CARE INSTRUCTIONS °· Do not participate in any activities where you could become injured for at least 24 hours. Do not: °¨ Drive. °¨ Swim. °¨ Ride a bicycle. °¨ Operate heavy machinery. °¨ Cook. °¨ Use power tools. °¨ Climb ladders. °¨ Work from a high place. °· Do not make important decisions or sign legal documents until you are improved. °· If you vomit, drink water, juice, or soup when you can drink without vomiting. Make sure you have little or no nausea before eating solid foods. °· Only take over-the-counter or prescription medicines for pain, discomfort, or fever as directed by your health care provider. °· Make sure you and your family fully understand everything about the medicines given to you, including what side effects may occur. °· You should not drink alcohol, take sleeping pills, or take medicines that cause drowsiness for at least 24 hours. °· If you smoke, do not smoke without supervision. °· If you are feeling better, you  may resume normal activities 24 hours after you were sedated. °· Keep all appointments with your health care provider. °SEEK MEDICAL CARE IF: °· Your skin is pale or bluish in color. °· You continue to feel nauseous or vomit. °· Your pain is getting worse and is not helped by medicine. °· You have bleeding or swelling. °· You are still sleepy or feeling clumsy after 24 hours. °SEEK IMMEDIATE MEDICAL CARE IF: °· You develop a rash. °· You have difficulty breathing. °· You develop any type of allergic problem. °· You have a fever. °MAKE SURE YOU: °· Understand these instructions. °· Will watch your condition. °· Will get help right away if you are not doing well or get worse. °  °This information is not intended to replace advice given to you by your health care provider. Make sure you discuss any questions you have with your health care provider. °  °Document Released: 05/07/2013 Document Revised: 08/07/2014 Document Reviewed: 05/07/2013 °Elsevier Interactive Patient Education ©2016 [employer] ° °

## 2015-09-09 NOTE — Procedures (Signed)
R IJ Port cathter placement with US and fluoroscopy No complication No blood loss. See complete dictation in [facility] P[facility].  

## 2015-09-09 NOTE — H&P (Signed)
Chief Complaint: Patient was seen today for port placement  at the request of Dr. Lindi Adie  Referring Physician(s): Dr. Lindi Adie  History of Present Illness: Paula Navarro is a 65 y.o. 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 She is to start chemotherapy soon and is scheduled for port. PMHx, meds, labs, allergies reviewed. She has had some recent cough and chest discomfort and was seen at [place] on 2/6 and was diagnosed with bronchitis. She was started on Z-Pak and Prednisone. She has not had any fevers and is feeling some better, though still has some cough. No SOB Has been NPO this am  Past Medical History  Diagnosis Date  . Chronic bronchitis   . Palpitation     Tachycardia reported by monitor clerk during a symptomatic spell  . Chest pain     Admitted to [facility] in 09/2011; refused stress test  . Atrial septal defect 1996    Surgical repair in 1996  . Tobacco abuse     60 pack years; 1.5 packs per day  . Anxiety   . Anemia   . Breast cancer ([place])   . Wears dentures     top  . COPD (chronic obstructive pulmonary disease) ([place])     on xray  . Chronic pain   . Radiation 06/30/14-08/17/14    Bilateral Breast    Past Surgical History  Procedure Laterality Date  . Cholecystectomy    . Cesarean section      x3  . Tubal ligation    . Asd repair, ostium primum  1996    dr Roxy Horseman  . Port a cath revision  1/15    put in   . Breast lumpectomy with radioactive seed localization Bilateral 04/08/2014    Procedure: BILATERAL  RADIOACTIVE SEED LOCALIZATION LUMPECTOMY ;  Surgeon: Autumn Messing III, MD;  Location: [facility];  Service: General;  Laterality: Bilateral;  . Axillary lymph node dissection Bilateral 04/08/2014    Procedure:  BILATERAL AXILLARY LYMPH NODE DISSECTION;  Surgeon: Autumn Messing III, MD;  Location: [place];  Service: General;  Laterality: Bilateral;  . Open heart surgery    . Breast biopsy Bilateral       Allergies: Codeine and Morphine and related  Medications: Prior to Admission medications   Medication Sig Start Date End Date Taking? Authorizing Provider  azithromycin (ZITHROMAX Z-PAK) 250 MG tablet 2 po day one, then 1 daily x 4 days 09/06/15  Yes Milton Ferguson, MD  B Complex-C (SUPER B COMPLEX PO) Take 1 tablet by mouth daily.   Yes Historical Provider, MD  calcium-vitamin D (OSCAL-500) 500-400 MG-UNIT tablet Take 1 tablet by mouth 2 (two) times daily. 08/18/15  Yes Nicholas Lose, MD  gabapentin (NEURONTIN) 300 MG capsule TAKE 2 CAPSULES (600 MG TOTAL) BY MOUTH 3 (THREE) TIMES DAILY. 06/14/15  Yes Nicholas Lose, MD  ibuprofen (ADVIL,MOTRIN) 200 MG tablet Take 200 mg by mouth every 6 (six) hours as needed for moderate pain.   Yes Historical Provider, MD  LORazepam (ATIVAN) 1 MG tablet Take 1 mg by mouth at bedtime.  06/17/15  Yes Historical Provider, MD  oxyCODONE-acetaminophen (PERCOCET/ROXICET) 5-325 MG tablet 1 tabs PO q6h prn pain Patient taking differently: Take 0.5-1 tablets by mouth every 6 (six) hours as needed for moderate pain. 1 tabs PO q6h prn pain 08/27/15  Yes Nicholas Lose, MD  predniSONE (DELTASONE) 20 MG tablet 2 tabs po daily x 3 days 09/06/15  Yes Milton Ferguson, MD  albuterol (PROVENTIL HFA;VENTOLIN HFA) 108 (90 Base) MCG/ACT inhaler Inhale 1-2 puffs into the lungs every 6 (six) hours as needed for wheezing or shortness of breath. 09/06/15   Milton Ferguson, MD  capecitabine (XELODA) 500 MG tablet Take 3 tablets (1,500 mg total) by mouth 2 (two) times daily after a meal. Patient not taking: Reported on 09/06/2015 06/29/15   Nicholas Lose, MD  guaiFENesin-dextromethorphan (ROBITUSSIN DM) 100-10 MG/5ML syrup Take 5 mLs by mouth every 4 (four) hours as needed for cough.    Historical Provider, MD  lidocaine-prilocaine (EMLA) cream Apply to affected area once Patient taking differently: Apply 1 application topically as needed (has not started). Apply to affected area once 09/03/15   Nicholas Lose, MD  ondansetron (ZOFRAN) 8 MG tablet Take 1 tablet (8 mg total) by mouth 2 (two) times daily as needed for refractory nausea / vomiting. Start on day 3 after carboplatin chemo. 09/03/15   Nicholas Lose, MD  prochlorperazine (COMPAZINE) 10 MG tablet Take 1 tablet (10 mg total) by mouth every 6 (six) hours as needed (Nausea or vomiting). 09/03/15   Nicholas Lose, MD     Family History  Problem Relation Age of Onset  . Breast cancer Maternal Aunt     Social History   Social History  . Marital Status: Widowed    Spouse Name: N/A  . Number of Children: N/A  . Years of Education: N/A   Social History Main Topics  . Smoking status: Former Smoker -- 1.50 packs/day for 40 years    Types: Cigarettes    Quit date: 08/01/2012  . Smokeless tobacco: Never Used  . Alcohol Use: No  . Drug Use: No  . Sexual Activity: Not Currently   Other Topics Concern  . None   Social History Narrative     Review of Systems: A 12 point ROS discussed and pertinent positives are indicated in the HPI above.  All other systems are negative.  Review of Systems  Vital Signs: BP 127/85 mmHg  Pulse 81  Temp(Src) 99 F (37.2 C) (Oral)  Resp 16  SpO2 98%  LMP 05/20/2011  Physical Exam  Constitutional: She is oriented to person, place, and time. She appears well-developed and well-nourished. No distress.  HENT:  Head: Normocephalic.  Mouth/Throat: Oropharynx is clear and moist.  Neck: Normal range of motion. No tracheal deviation present.  Cardiovascular: Normal rate, regular rhythm and normal heart sounds.   Pulmonary/Chest: Effort normal and breath sounds normal. No respiratory distress. She has no wheezes.  Neurological: She is alert and oriented to person, place, and time.  Psychiatric: She has a normal mood and affect. Judgment normal.    Mallampati Score:  MD Evaluation Airway: WNL Heart: WNL Abdomen: WNL Chest/ Lungs: WNL ASA  Classification: 2 Mallampati/Airway Score:  Two  Imaging: Dg Chest 2 View  09/06/2015  CLINICAL DATA:  Shortness of breath. History of breast carcinoma with known lung metastases EXAM: CHEST  2 VIEW COMPARISON:  Chest CT September 01, 2015; chest radiograph February 07, 2014 FINDINGS: Multiple pulmonary nodular lesions apparent, not felt to be changed from recent prior CT examination. The largest of these nodular lesions is in the right middle lobe, measuring 2.1 x 1.8 cm. There is no frank edema or consolidation. No new opacity. Heart size and pulmonary vascularity are normal. No adenopathy is evident. There are surgical clips in both axillary regions. The known large left chest wall mass seen by CT is not well seen  by CT. There old healed rib fractures on the left. IMPRESSION: Stable appearing pulmonary nodular lesions. Surgical clips in each axilla. Known left chest wall lesion not well seen radiographically. No new lesion identified compared to recent CT examination. Electronically Signed   By: Lowella Grip III M.D.   On: 09/06/2015 11:53   Ct Chest W Contrast  09/01/2015  CLINICAL DATA:  Restaging bilateral lung cancer. Initial diagnosis 2014. Recurrence October 2016. EXAM: CT CHEST, ABDOMEN, AND PELVIS WITH CONTRAST TECHNIQUE: Multidetector CT imaging of the chest, abdomen and pelvis was performed following the standard protocol during bolus administration of intravenous contrast. CONTRAST:  16m OMNIPAQUE IOHEXOL 300 MG/ML  SOLN COMPARISON:  05/28/2015 FINDINGS: CT CHEST FINDINGS Mediastinum/Lymph Nodes: Large bulky left chest wall mass a increased since prior examination. This measures a maximum of 7.2 x 5.1 cm on image 10. Multiple small subcutaneous nodules are now noted and not present on the prior study consistent with progressive disease. Bilateral skin thickening over the breasts is likely due to radiation change. The right axillary fluid collection as contracted and is surrounded by scar. No obvious tumor. No enlarged supraclavicular or  axillary lymph nodes. There are surgical changes in the axillas bilaterally with multiple clips. The left chest wall tumor does involve the subpectoral region but no obvious bone involvement. The heart is normal in size. No pericardial effusion. The aorta is normal in caliber. No dissection. The branch vessels are patent. Stable mediastinal and hilar lymph nodes. The 10 mm precarinal node is unchanged. The elongated prevascular node has a short axis diameter of 7 mm and is stable. The esophagus is grossly normal. There is a small hiatal hernia which is stable. Lungs/Pleura: New 4 mm pulmonary nodule in the left upper lobe on image 10. New 7.5 mm right upper lobe nodule on image 14. 12.5 mm right upper lobe nodule on image 16 previously measured 9 mm. 21.5 mm right middle lobe pulmonary nodule on image 30 previously measured 7 mm. 18.5 mm right lower lobe pulmonary nodule on image 37 previously measured 13.5 mm 9.5 mm left upper lobe pulmonary nodule on image 25 was barely visible on the prior study. Several other new pulmonary nodules are noted bilaterally. No acute pulmonary findings.  No pleural effusion. Musculoskeletal: Stable surgical changes from a median sternotomy. No destructive bony lesions to suggest metastatic disease. CT ABDOMEN PELVIS FINDINGS Hepatobiliary: Severe diffuse fatty infiltration of the liver but no focal hepatic lesions to suggest metastatic disease. The gallbladder is surgically absent. Mild associated common bile duct dilatation. Pancreas: No mass, inflammation or ductal dilatation. Spleen: Normal size.  No focal lesions. Adrenals/Urinary Tract: The adrenal glands and kidneys are normal and stable. Stable right renal cyst. Stomach/Bowel: The stomach, duodenum, small bowel and colon are unremarkable. No inflammatory changes, mass lesions or obstructive findings. Vascular/Lymphatic: No mesenteric or retroperitoneal mass or adenopathy. Small scattered lymph nodes are noted. The aorta and  branch vessels are patent. Stable scattered atherosclerotic calcifications. Reproductive: Stable calcified and noncalcified uterine fibroids. The ovaries are normal. Other: The bladder is normal. No pelvic mass or adenopathy. No free pelvic fluid collections. No inguinal mass or adenopathy. No abdominal wall hernia or subcutaneous lesions. Musculoskeletal: No worrisome bone lesions to suggest metastatic disease. IMPRESSION: 1. Progressive left chest wall recurrence with an enlarging mass and several new subcutaneous metastatic nodules. 2. Progressive pulmonary metastatic disease. 3. No findings for abdominal/pelvic metastatic disease or osseous metastatic disease. Electronically Signed   By: PRicky StabsD.  On: 09/01/2015 13:52   Ct Abdomen Pelvis W Contrast  09/01/2015  CLINICAL DATA:  Restaging bilateral lung cancer. Initial diagnosis 2014. Recurrence October 2016. EXAM: CT CHEST, ABDOMEN, AND PELVIS WITH CONTRAST TECHNIQUE: Multidetector CT imaging of the chest, abdomen and pelvis was performed following the standard protocol during bolus administration of intravenous contrast. CONTRAST:  179m OMNIPAQUE IOHEXOL 300 MG/ML  SOLN COMPARISON:  05/28/2015 FINDINGS: CT CHEST FINDINGS Mediastinum/Lymph Nodes: Large bulky left chest wall mass a increased since prior examination. This measures a maximum of 7.2 x 5.1 cm on image 10. Multiple small subcutaneous nodules are now noted and not present on the prior study consistent with progressive disease. Bilateral skin thickening over the breasts is likely due to radiation change. The right axillary fluid collection as contracted and is surrounded by scar. No obvious tumor. No enlarged supraclavicular or axillary lymph nodes. There are surgical changes in the axillas bilaterally with multiple clips. The left chest wall tumor does involve the subpectoral region but no obvious bone involvement. The heart is normal in size. No pericardial effusion. The aorta is normal  in caliber. No dissection. The branch vessels are patent. Stable mediastinal and hilar lymph nodes. The 10 mm precarinal node is unchanged. The elongated prevascular node has a short axis diameter of 7 mm and is stable. The esophagus is grossly normal. There is a small hiatal hernia which is stable. Lungs/Pleura: New 4 mm pulmonary nodule in the left upper lobe on image 10. New 7.5 mm right upper lobe nodule on image 14. 12.5 mm right upper lobe nodule on image 16 previously measured 9 mm. 21.5 mm right middle lobe pulmonary nodule on image 30 previously measured 7 mm. 18.5 mm right lower lobe pulmonary nodule on image 37 previously measured 13.5 mm 9.5 mm left upper lobe pulmonary nodule on image 25 was barely visible on the prior study. Several other new pulmonary nodules are noted bilaterally. No acute pulmonary findings.  No pleural effusion. Musculoskeletal: Stable surgical changes from a median sternotomy. No destructive bony lesions to suggest metastatic disease. CT ABDOMEN PELVIS FINDINGS Hepatobiliary: Severe diffuse fatty infiltration of the liver but no focal hepatic lesions to suggest metastatic disease. The gallbladder is surgically absent. Mild associated common bile duct dilatation. Pancreas: No mass, inflammation or ductal dilatation. Spleen: Normal size.  No focal lesions. Adrenals/Urinary Tract: The adrenal glands and kidneys are normal and stable. Stable right renal cyst. Stomach/Bowel: The stomach, duodenum, small bowel and colon are unremarkable. No inflammatory changes, mass lesions or obstructive findings. Vascular/Lymphatic: No mesenteric or retroperitoneal mass or adenopathy. Small scattered lymph nodes are noted. The aorta and branch vessels are patent. Stable scattered atherosclerotic calcifications. Reproductive: Stable calcified and noncalcified uterine fibroids. The ovaries are normal. Other: The bladder is normal. No pelvic mass or adenopathy. No free pelvic fluid collections. No  inguinal mass or adenopathy. No abdominal wall hernia or subcutaneous lesions. Musculoskeletal: No worrisome bone lesions to suggest metastatic disease. IMPRESSION: 1. Progressive left chest wall recurrence with an enlarging mass and several new subcutaneous metastatic nodules. 2. Progressive pulmonary metastatic disease. 3. No findings for abdominal/pelvic metastatic disease or osseous metastatic disease. Electronically Signed   By: PMarijo SanesM.D.   On: 09/01/2015 13:52    Labs:  CBC:  Recent Labs  05/28/15 0822 07/20/15 1000 08/17/15 1008 09/09/15 0800  WBC 7.8 11.3* 9.1 13.7*  HGB 12.2 11.8 11.2* 10.9*  HCT 38.3 38.9 36.2 35.0*  PLT 290 277 250 285    COAGS:  Recent Labs  01/01/15 0805 09/09/15 0800  INR 0.99 1.12  APTT 31 24    BMP:  Recent Labs  01/07/15 1409 05/28/15 0823 07/20/15 1000 08/17/15 1008  NA 133* 141 137 138  K 4.3 4.0 4.7 4.2  CL 95*  --   --   --   CO2 '26 26 26 26  '$ GLUCOSE 117* 135 136 133  BUN 10 15.5 9.6 12.3  CALCIUM 9.3 10.0 10.3 9.9  CREATININE 0.79 0.8 0.9 0.9  GFRNONAA >60  --   --   --   GFRAA >60  --   --   --     LIVER FUNCTION TESTS:  Recent Labs  11/12/14 0905 05/28/15 0823 07/20/15 1000 08/17/15 1008  BILITOT 0.49 0.87 0.94 1.06  AST 29 33 39* 42*  ALT 25 32 34 34  ALKPHOS 72 68 69 57  PROT 7.3 7.2 7.7 7.2  ALBUMIN 3.8 3.8 3.8 3.7    TUMOR MARKERS: No results for input(s): AFPTM, CEA, CA199, CHROMGRNA in the last 8760 hours.  Assessment and Plan: Progressive left chest wall recurrence of breast cancer with an enlarging mass and several new pulmonary nodules Labs reviewed, WBC elevated likely from recent Prednisone dosing. Afebrile and has been on po abx for past 3 days D/W Dr. Lindi Adie, ok to proceed with port placement as he will begin therapy on her tomorrow. Risks and Benefits discussed with the patient including, but not limited to bleeding, infection, pneumothorax, or fibrin sheath development and need  for additional procedures. All of the patient's questions were answered, patient is agreeable to proceed. Consent signed and in chart.    Thank you for this interesting consult.  I greatly enjoyed meeting Paula Navarro and look forward to participating in their care.  A copy of this report was sent to the requesting provider on this date.  Electronically Signed: Ascencion Dike 09/09/2015, 9:09 AM   I spent a total of 20 minutesin face to face in clinical consultation, greater than 50% of which was counseling/coordinating care for port placement

## 2015-09-09 NOTE — Progress Notes (Signed)
Pt arrived today for her Port Placement in IR. She is wearing a mask and states she was diagnosed with bronchitis after going to [facility] ED on 09/06/15. She has a loose nonproductive cough and was placed on Zithromax. Temp is 99 but she took Advil and Oxycodone with Tyelnol at 0330 for pain. Labs were drawn and tubed to lab and saline lock placed. Placed call to Ascencion Dike PA Radiology about all this and he will be coming to evaluate her.Marland Kitchen

## 2015-09-10 ENCOUNTER — Other Ambulatory Visit: Payer: Self-pay | Admitting: Nurse Practitioner

## 2015-09-10 ENCOUNTER — Ambulatory Visit (HOSPITAL_BASED_OUTPATIENT_CLINIC_OR_DEPARTMENT_OTHER): Payer: Medicare Other | Admitting: Hematology and Oncology

## 2015-09-10 ENCOUNTER — Other Ambulatory Visit (HOSPITAL_BASED_OUTPATIENT_CLINIC_OR_DEPARTMENT_OTHER): Payer: Medicare Other

## 2015-09-10 ENCOUNTER — Ambulatory Visit (HOSPITAL_BASED_OUTPATIENT_CLINIC_OR_DEPARTMENT_OTHER): Payer: Medicare Other

## 2015-09-10 ENCOUNTER — Encounter: Payer: Self-pay | Admitting: Hematology and Oncology

## 2015-09-10 VITALS — BP 136/72 | HR 87 | Temp 98.4°F | Resp 18 | Ht 64.0 in | Wt 139.7 lb

## 2015-09-10 DIAGNOSIS — C50512 Malignant neoplasm of lower-outer quadrant of left female breast: Secondary | ICD-10-CM

## 2015-09-10 DIAGNOSIS — Z5111 Encounter for antineoplastic chemotherapy: Secondary | ICD-10-CM | POA: Diagnosis not present

## 2015-09-10 DIAGNOSIS — C50511 Malignant neoplasm of lower-outer quadrant of right female breast: Secondary | ICD-10-CM

## 2015-09-10 DIAGNOSIS — C7800 Secondary malignant neoplasm of unspecified lung: Secondary | ICD-10-CM | POA: Insufficient documentation

## 2015-09-10 DIAGNOSIS — C50912 Malignant neoplasm of unspecified site of left female breast: Secondary | ICD-10-CM

## 2015-09-10 DIAGNOSIS — C7801 Secondary malignant neoplasm of right lung: Secondary | ICD-10-CM

## 2015-09-10 DIAGNOSIS — C50911 Malignant neoplasm of unspecified site of right female breast: Secondary | ICD-10-CM

## 2015-09-10 DIAGNOSIS — C7951 Secondary malignant neoplasm of bone: Secondary | ICD-10-CM

## 2015-09-10 DIAGNOSIS — C50919 Malignant neoplasm of unspecified site of unspecified female breast: Secondary | ICD-10-CM

## 2015-09-10 DIAGNOSIS — C7989 Secondary malignant neoplasm of other specified sites: Secondary | ICD-10-CM

## 2015-09-10 LAB — CBC WITH DIFFERENTIAL/PLATELET
BASO%: 0.5 % (ref 0.0–2.0)
Basophils Absolute: 0.1 10*3/uL (ref 0.0–0.1)
EOS ABS: 0.5 10*3/uL (ref 0.0–0.5)
EOS%: 3.7 % (ref 0.0–7.0)
HCT: 37.8 % (ref 34.8–46.6)
HGB: 11.6 g/dL (ref 11.6–15.9)
LYMPH%: 21 % (ref 14.0–49.7)
MCH: 23.4 pg — AB (ref 25.1–34.0)
MCHC: 30.6 g/dL — AB (ref 31.5–36.0)
MCV: 76.4 fL — AB (ref 79.5–101.0)
MONO#: 1 10*3/uL — ABNORMAL HIGH (ref 0.1–0.9)
MONO%: 7.7 % (ref 0.0–14.0)
NEUT%: 67.1 % (ref 38.4–76.8)
NEUTROS ABS: 9.1 10*3/uL — AB (ref 1.5–6.5)
Platelets: 255 10*3/uL (ref 145–400)
RBC: 4.95 10*6/uL (ref 3.70–5.45)
RDW: 20.4 % — ABNORMAL HIGH (ref 11.2–14.5)
WBC: 13.6 10*3/uL — AB (ref 3.9–10.3)
lymph#: 2.8 10*3/uL (ref 0.9–3.3)

## 2015-09-10 LAB — COMPREHENSIVE METABOLIC PANEL
ALT: 34 U/L (ref 0–55)
AST: 40 U/L — ABNORMAL HIGH (ref 5–34)
Albumin: 3.6 g/dL (ref 3.5–5.0)
Alkaline Phosphatase: 70 U/L (ref 40–150)
Anion Gap: 11 mEq/L (ref 3–11)
BUN: 11.6 mg/dL (ref 7.0–26.0)
CHLORIDE: 102 meq/L (ref 98–109)
CO2: 26 meq/L (ref 22–29)
Calcium: 9.6 mg/dL (ref 8.4–10.4)
Creatinine: 0.8 mg/dL (ref 0.6–1.1)
GLUCOSE: 118 mg/dL (ref 70–140)
POTASSIUM: 3.9 meq/L (ref 3.5–5.1)
SODIUM: 139 meq/L (ref 136–145)
TOTAL PROTEIN: 7.2 g/dL (ref 6.4–8.3)
Total Bilirubin: 1.19 mg/dL (ref 0.20–1.20)

## 2015-09-10 MED ORDER — SODIUM CHLORIDE 0.9 % IV SOLN
10.0000 mg | Freq: Once | INTRAVENOUS | Status: AC
  Administered 2015-09-10: 10 mg via INTRAVENOUS
  Filled 2015-09-10: qty 1

## 2015-09-10 MED ORDER — GEMCITABINE HCL CHEMO INJECTION 1 GM/26.3ML
800.0000 mg/m2 | Freq: Once | INTRAVENOUS | Status: AC
  Administered 2015-09-10: 1368 mg via INTRAVENOUS
  Filled 2015-09-10: qty 35.98

## 2015-09-10 MED ORDER — SODIUM CHLORIDE 0.9 % IV SOLN
488.0000 mg | Freq: Once | INTRAVENOUS | Status: AC
  Administered 2015-09-10: 490 mg via INTRAVENOUS
  Filled 2015-09-10: qty 49

## 2015-09-10 MED ORDER — PALONOSETRON HCL INJECTION 0.25 MG/5ML
0.2500 mg | Freq: Once | INTRAVENOUS | Status: AC
  Administered 2015-09-10: 0.25 mg via INTRAVENOUS

## 2015-09-10 MED ORDER — AMOXICILLIN 500 MG PO CAPS: 500.0000 mg | ORAL_CAPSULE | Freq: Two times a day (BID) | ORAL | Status: DC

## 2015-09-10 MED ORDER — HEPARIN SOD (PORK) LOCK FLUSH 100 UNIT/ML IV SOLN
500.0000 [IU] | Freq: Once | INTRAVENOUS | Status: AC | PRN
  Administered 2015-09-10: 500 [IU]
  Filled 2015-09-10: qty 5

## 2015-09-10 MED ORDER — SODIUM CHLORIDE 0.9% FLUSH
10.0000 mL | INTRAVENOUS | Status: DC | PRN
  Administered 2015-09-10: 10 mL
  Filled 2015-09-10: qty 10

## 2015-09-10 MED ORDER — SODIUM CHLORIDE 0.9 % IV SOLN
Freq: Once | INTRAVENOUS | Status: AC
  Administered 2015-09-10: 13:00:00 via INTRAVENOUS

## 2015-09-10 MED ORDER — PALONOSETRON HCL INJECTION 0.25 MG/5ML
INTRAVENOUS | Status: AC
  Filled 2015-09-10: qty 5

## 2015-09-10 NOTE — Patient Instructions (Signed)
Carboplatin injection What is this medicine? CARBOPLATIN (KAR boe pla tin) is a chemotherapy drug. It targets fast dividing cells, like cancer cells, and causes these cells to die. This medicine is used to treat ovarian cancer and many other cancers. This medicine may be used for other purposes; ask your health care provider or pharmacist if you have questions. What should I tell my health care provider before I take this medicine? They need to know if you have any of these conditions: -blood disorders -hearing problems -kidney disease -recent or ongoing radiation therapy -an unusual or allergic reaction to carboplatin, cisplatin, other chemotherapy, other medicines, foods, dyes, or preservatives -pregnant or trying to get pregnant -breast-feeding How should I use this medicine? This drug is usually given as an infusion into a vein. It is administered in a hospital or clinic by a specially trained health care professional. Talk to your pediatrician regarding the use of this medicine in children. Special care may be needed. Overdosage: If you think you have taken too much of this medicine contact a poison control center or emergency room at once. NOTE: This medicine is only for you. Do not share this medicine with others. What if I miss a dose? It is important not to miss a dose. Call your doctor or health care professional if you are unable to keep an appointment. What may interact with this medicine? -medicines for seizures -medicines to increase blood counts like filgrastim, pegfilgrastim, sargramostim -some antibiotics like amikacin, gentamicin, neomycin, streptomycin, tobramycin -vaccines Talk to your doctor or health care professional before taking any of these medicines: -acetaminophen -aspirin -ibuprofen -ketoprofen -naproxen This list may not describe all possible interactions. Give your health care provider a list of all the medicines, herbs, non-prescription drugs, or dietary  supplements you use. Also tell them if you smoke, drink alcohol, or use illegal drugs. Some items may interact with your medicine. What should I watch for while using this medicine? Your condition will be monitored carefully while you are receiving this medicine. You will need important blood work done while you are taking this medicine. This drug may make you feel generally unwell. This is not uncommon, as chemotherapy can affect healthy cells as well as cancer cells. Report any side effects. Continue your course of treatment even though you feel ill unless your doctor tells you to stop. In some cases, you may be given additional medicines to help with side effects. Follow all directions for their use. Call your doctor or health care professional for advice if you get a fever, chills or sore throat, or other symptoms of a cold or flu. Do not treat yourself. This drug decreases your body's ability to fight infections. Try to avoid being around people who are sick. This medicine may increase your risk to bruise or bleed. Call your doctor or health care professional if you notice any unusual bleeding. Be careful brushing and flossing your teeth or using a toothpick because you may get an infection or bleed more easily. If you have any dental work done, tell your dentist you are receiving this medicine. Avoid taking products that contain aspirin, acetaminophen, ibuprofen, naproxen, or ketoprofen unless instructed by your doctor. These medicines may hide a fever. Do not become pregnant while taking this medicine. Women should inform their doctor if they wish to become pregnant or think they might be pregnant. There is a potential for serious side effects to an unborn child. Talk to your health care professional or pharmacist for more information.   Do not breast-feed an infant while taking this medicine. What side effects may I notice from receiving this medicine? Side effects that you should report to your  doctor or health care professional as soon as possible: -allergic reactions like skin rash, itching or hives, swelling of the face, lips, or tongue -signs of infection - fever or chills, cough, sore throat, pain or difficulty passing urine -signs of decreased platelets or bleeding - bruising, pinpoint red spots on the skin, black, tarry stools, nosebleeds -signs of decreased red blood cells - unusually weak or tired, fainting spells, lightheadedness -breathing problems -changes in hearing -changes in vision -chest pain -high blood pressure -low blood counts - This drug may decrease the number of white blood cells, red blood cells and platelets. You may be at increased risk for infections and bleeding. -nausea and vomiting -pain, swelling, redness or irritation at the injection site -pain, tingling, numbness in the hands or feet -problems with balance, talking, walking -trouble passing urine or change in the amount of urine Side effects that usually do not require medical attention (report to your doctor or health care professional if they continue or are bothersome): -hair loss -loss of appetite -metallic taste in the mouth or changes in taste This list may not describe all possible side effects. Call your doctor for medical advice about side effects. You may report side effects to FDA at 1-800-FDA-1088. Where should I keep my medicine? This drug is given in a hospital or clinic and will not be stored at home. NOTE: This sheet is a summary. It may not cover all possible information. If you have questions about this medicine, talk to your doctor, pharmacist, or health care provider.    2016, Elsevier/Gold Standard. (2007-10-22 14:38:05) Gemcitabine injection What is this medicine? GEMCITABINE (jem SIT a been) is a chemotherapy drug. This medicine is used to treat many types of cancer like breast cancer, lung cancer, pancreatic cancer, and ovarian cancer. This medicine may be used for  other purposes; ask your health care provider or pharmacist if you have questions. What should I tell my health care provider before I take this medicine? They need to know if you have any of these conditions: -blood disorders -infection -kidney disease -liver disease -recent or ongoing radiation therapy -an unusual or allergic reaction to gemcitabine, other chemotherapy, other medicines, foods, dyes, or preservatives -pregnant or trying to get pregnant -breast-feeding How should I use this medicine? This drug is given as an infusion into a vein. It is administered in a hospital or clinic by a specially trained health care professional. Talk to your pediatrician regarding the use of this medicine in children. Special care may be needed. Overdosage: If you think you have taken too much of this medicine contact a poison control center or emergency room at once. NOTE: This medicine is only for you. Do not share this medicine with others. What if I miss a dose? It is important not to miss your dose. Call your doctor or health care professional if you are unable to keep an appointment. What may interact with this medicine? -medicines to increase blood counts like filgrastim, pegfilgrastim, sargramostim -some other chemotherapy drugs like cisplatin -vaccines Talk to your doctor or health care professional before taking any of these medicines: -acetaminophen -aspirin -ibuprofen -ketoprofen -naproxen This list may not describe all possible interactions. Give your health care provider a list of all the medicines, herbs, non-prescription drugs, or dietary supplements you use. Also tell them if you smoke, drink  alcohol, or use illegal drugs. Some items may interact with your medicine. What should I watch for while using this medicine? Visit your doctor for checks on your progress. This drug may make you feel generally unwell. This is not uncommon, as chemotherapy can affect healthy cells as well  as cancer cells. Report any side effects. Continue your course of treatment even though you feel ill unless your doctor tells you to stop. In some cases, you may be given additional medicines to help with side effects. Follow all directions for their use. Call your doctor or health care professional for advice if you get a fever, chills or sore throat, or other symptoms of a cold or flu. Do not treat yourself. This drug decreases your body's ability to fight infections. Try to avoid being around people who are sick. This medicine may increase your risk to bruise or bleed. Call your doctor or health care professional if you notice any unusual bleeding. Be careful brushing and flossing your teeth or using a toothpick because you may get an infection or bleed more easily. If you have any dental work done, tell your dentist you are receiving this medicine. Avoid taking products that contain aspirin, acetaminophen, ibuprofen, naproxen, or ketoprofen unless instructed by your doctor. These medicines may hide a fever. Women should inform their doctor if they wish to become pregnant or think they might be pregnant. There is a potential for serious side effects to an unborn child. Talk to your health care professional or pharmacist for more information. Do not breast-feed an infant while taking this medicine. What side effects may I notice from receiving this medicine? Side effects that you should report to your doctor or health care professional as soon as possible: -allergic reactions like skin rash, itching or hives, swelling of the face, lips, or tongue -low blood counts - this medicine may decrease the number of white blood cells, red blood cells and platelets. You may be at increased risk for infections and bleeding. -signs of infection - fever or chills, cough, sore throat, pain or difficulty passing urine -signs of decreased platelets or bleeding - bruising, pinpoint red spots on the skin, black, tarry  stools, blood in the urine -signs of decreased red blood cells - unusually weak or tired, fainting spells, lightheadedness -breathing problems -chest pain -mouth sores -nausea and vomiting -pain, swelling, redness at site where injected -pain, tingling, numbness in the hands or feet -stomach pain -swelling of ankles, feet, hands -unusual bleeding Side effects that usually do not require medical attention (report to your doctor or health care professional if they continue or are bothersome): -constipation -diarrhea -hair loss -loss of appetite -stomach upset This list may not describe all possible side effects. Call your doctor for medical advice about side effects. You may report side effects to FDA at 1-800-FDA-1088. Where should I keep my medicine? This drug is given in a hospital or clinic and will not be stored at home. NOTE: This sheet is a summary. It may not cover all possible information. If you have questions about this medicine, talk to your doctor, pharmacist, or health care provider.    2016, Elsevier/Gold Standard. (2007-11-26 18:45:54)

## 2015-09-10 NOTE — Progress Notes (Signed)
Patient Care Team: [name], MD as PCP - General (Family Medicine) [name], MD (Cardiology)  DIAGNOSIS: Bilateral breast cancer [facility])   Staging form: Breast, AJCC 7th Edition     Clinical: Stage IIB (T2, N1, cM0) - Unsigned       Staging comments: Staged at breast conference 08/13/13      Pathologic: No stage assigned - Unsigned   SUMMARY OF ONCOLOGIC HISTORY:   Bilateral breast cancer ([place])   07/23/2013 Mammogram Bilateral breast masses. 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 N1 M0 stage IIB ER 100%, PR 0%, HER-2    06/17/2014 -  Radiation Therapy Adjuvant radiation therapy   09/14/2014 -  Anti-estrogen oral therapy Anastrozole 1 mg daily   05/28/2015 Imaging CT scans: Enlarging subpectoral masses 3.1 x 3.5 cm, posteriorly lower density mass 4.5 x 2.1 cm, several right-sided lung nodules right lower lobe 1.4 cm, 3 other right lung nodules, 1.7 cm right iliac bone lesion   06/21/2015 Procedure Left subpectoral mass biopsy: Invasive high-grade  ductal carcinoma ER 5%, PR 0%, HER-2 negative ratio 1.29   09/01/2015 Imaging Left chest wall mass increased in size 7.2 x 5.1 cm, multiple subcutaneous nodules, increase in the lung nodules both in number as well as in the size of existing nodules    CHIEF COMPLIANT: Metastatic breast cancer, cycle 1 day 1 carboplatin and gemcitabine  INTERVAL HISTORY: Paula Navarro is a 65 year old above-mentioned history of metastatic breast cancer who is here to start first cycle of chemotherapy with carboplatin and gemcitabine. She is starting to have increasing pain and discomfort in the left chest wall mass. She takes Percocets which appear to be helping her pain.  REVIEW OF SYSTEMS:   Constitutional: Denies fevers, chills or abnormal weight loss Eyes: Denies blurriness of vision Ears, nose, mouth, throat, and face: Denies mucositis or sore throat Respiratory: Denies cough, dyspnea or wheezes Cardiovascular: Denies palpitation, chest discomfort Gastrointestinal:  Denies nausea, heartburn or change in bowel habits Skin: Denies abnormal skin rashes Lymphatics: Denies new lymphadenopathy or easy bruising Neurological:Denies numbness, tingling or new weaknesses Behavioral/Psych: Mood is stable, no new changes  Extremities: No lower extremity edema Breast: Left chest wall mass extending into the left axilla All other systems were reviewed with the patient and are negative.  I have reviewed the past medical history, past surgical history, social history and family history with the patient and they are unchanged from previous note.  ALLERGIES:  is allergic to codeine and morphine and related.  MEDICATIONS:  Current  Outpatient Prescriptions  Medication Sig Dispense Refill  . albuterol (PROVENTIL HFA;VENTOLIN HFA) 108 (90 Base) MCG/ACT inhaler Inhale 1-2 puffs into the lungs every 6 (six) hours as needed for wheezing or shortness of breath. 1 Inhaler 0  . amoxicillin (AMOXIL) 500 MG capsule Take 1 capsule  (500 mg total) by mouth 2 (two) times daily. 20 capsule 0  . B Complex-C (SUPER B COMPLEX PO) Take 1 tablet by mouth daily.    . calcium-vitamin D (OSCAL-500) 500-400 MG-UNIT tablet Take 1 tablet by mouth 2 (two) times daily. 60 tablet 3  . gabapentin (NEURONTIN) 300 MG capsule TAKE 2 CAPSULES (600 MG TOTAL) BY MOUTH 3 (THREE) TIMES DAILY. 180 capsule 5  . guaiFENesin-dextromethorphan (ROBITUSSIN DM) 100-10 MG/5ML syrup Take 5 mLs by mouth every 4 (four) hours as needed for cough.    Marland Kitchen ibuprofen (ADVIL,MOTRIN) 200 MG tablet Take 200 mg by mouth every 6 (six) hours as needed for moderate pain.    Marland Kitchen lidocaine-prilocaine (EMLA) cream Apply to affected area once (Patient taking differently: Apply 1 application topically as needed (has not started). Apply to affected area once) 30 g 3  . LORazepam (ATIVAN) 1 MG tablet Take 1 mg by mouth at bedtime.     . ondansetron (ZOFRAN) 8 MG tablet Take 1 tablet (8 mg total) by mouth 2 (two) times daily as needed for refractory nausea / vomiting. Start on day 3 after carboplatin chemo. 30 tablet 1  . oxyCODONE-acetaminophen (PERCOCET/ROXICET) 5-325 MG tablet 1 tabs PO q6h prn pain (Patient taking differently: Take 0.5-1 tablets by mouth every 6 (six) hours as needed for moderate pain. 1 tabs PO q6h prn pain) 60 tablet 0  . predniSONE (DELTASONE) 20 MG tablet 2 tabs po daily x 3 days 6 tablet 0  . prochlorperazine (COMPAZINE) 10 MG tablet Take 1 tablet (10 mg total) by mouth every 6 (six) hours as needed (Nausea or vomiting). 30 tablet 1   No current facility-administered medications for this visit.    PHYSICAL EXAMINATION: ECOG PERFORMANCE STATUS: 1 - Symptomatic but completely ambulatory  Filed Vitals:   09/10/15 1131  BP: 136/72  Pulse: 87  Temp: 98.4 F (36.9 C)  Resp: 18   Filed Weights   09/10/15 1131  Weight: 139 lb 11.2 oz (63.368 kg)    GENERAL:alert, no distress and comfortable SKIN: skin color, texture, turgor are normal, no rashes or  significant lesions EYES: normal, Conjunctiva are pink and non-injected, sclera clear OROPHARYNX:no exudate, no erythema and lips, buccal mucosa, and tongue normal  NECK: supple, thyroid normal size, non-tender, without nodularity LYMPH:  no palpable lymphadenopathy in the cervical, axillary or inguinal LUNGS: clear to auscultation and percussion with normal breathing effort HEART: regular rate & rhythm and no murmurs and no lower extremity edema ABDOMEN:abdomen soft, non-tender and normal bowel sounds MUSCULOSKELETAL:no cyanosis of digits and no clubbing  NEURO: alert & oriented x 3 with fluent speech, no focal motor/sensory deficits EXTREMITIES: No lower extremity edema  LABORATORY DATA:  I have reviewed the data as listed   Chemistry      Component Value Date/Time   NA 139 09/10/2015 1118   NA 133* 01/07/2015 1409   K 3.9 09/10/2015 1118   K 4.3 01/07/2015 1409   CL 95* 01/07/2015 1409   CO2 26 09/10/2015 1118   CO2 26 01/07/2015 1409   BUN 11.6 09/10/2015 1118   BUN 10 01/07/2015 1409   CREATININE 0.8 09/10/2015 1118   CREATININE 0.79 01/07/2015 1409  Component Value Date/Time   CALCIUM 9.6 09/10/2015 1118   CALCIUM 9.3 01/07/2015 1409   ALKPHOS 70 09/10/2015 1118   AST 40* 09/10/2015 1118   ALT 34 09/10/2015 1118   BILITOT 1.19 09/10/2015 1118       Lab Results  Component Value Date   WBC 13.6* 09/10/2015   HGB 11.6 09/10/2015   HCT 37.8 09/10/2015   MCV 76.4* 09/10/2015   PLT 255 09/10/2015   NEUTROABS 9.1* 09/10/2015     ASSESSMENT & PLAN:  Bilateral breast cancer ([place]) Treated with neoadjuvant chemotherapy followed by surgery 04/08/2014, radiation treatment completed 07/17/2014, antiestrogen therapy with anastrozole 09/14/2014 Left breast T2, N0, M0, IDC grade 3; 2.1 cm with high-grade DCIS 0/16 LN, ER 6%, PR 0%, HER-2 negative Right breast invasive ductal carcinoma grade 3; 1.8 cm with high-grade DCIS 1/11 lymph nodes positive ER 100% PR 0% HER-2  negative T1 C. N1 M0 stage IIB  Radiology CT chest abdomen pelvis 05/28/2015: Developing subpectoral masses 3.5 cm and 4.5 cm. Four lung nodules the largest 1.4 cm in the right lung 1.7 cm sclerotic right iliac bone lesion concerning for metastatic disease Left subpectoral mass biopsy 06/21/2015: Invasive high-grade ductal carcinoma ER 5%, PR 0%, HER-2 negative ratio 1.29 Prior treatment: Xeloda 1500 mg by mouth twice a day started 07/07/2015 stopped 09/03/2015 (stopped for progression)  Bone metastases: Xgeva every 6 weeks with Calcium + D started January 2017 CT chest abdomen pelvis 09/01/15: Progressive left chest wall recurrence with an enlarging mass and severa; new sub cutaneous nodules. Progressive lung nodules -------------------------------------------------------------------------------------------------------------------------- Treatment plan: Carboplatin day 1 and gemcitabine days 1 and 8 every 3 weeks with Neulasta started 09/10/2015 I discussed the chemotherapy regimen detail. Unfortunately the left-sided chest wall tumor is rapidly growing in size. It is now starting to cause pain and discomfort. She is taking narcotic pain medications which are alleviating her pain. RTC in 1 week for cycle 1 day 8   No orders of the defined types were placed in this encounter.   The patient has a good understanding of the overall plan. she agrees with it. she will call with any problems that may develop before the next visit here.   Rulon Eisenmenger, MD 09/10/2015

## 2015-09-12 ENCOUNTER — Encounter (HOSPITAL_COMMUNITY): Payer: Self-pay | Admitting: Emergency Medicine

## 2015-09-12 ENCOUNTER — Emergency Department (HOSPITAL_COMMUNITY)
Admission: EM | Admit: 2015-09-12 | Discharge: 2015-09-12 | Disposition: A | Discharge status: Home / Self Care | Payer: Medicare Other | Attending: Emergency Medicine | Admitting: Emergency Medicine

## 2015-09-12 DIAGNOSIS — Z9889 Other specified postprocedural states: Secondary | ICD-10-CM | POA: Insufficient documentation

## 2015-09-12 DIAGNOSIS — Z792 Long term (current) use of antibiotics: Secondary | ICD-10-CM | POA: Insufficient documentation

## 2015-09-12 DIAGNOSIS — Z87891 Personal history of nicotine dependence: Secondary | ICD-10-CM | POA: Diagnosis not present

## 2015-09-12 DIAGNOSIS — R0789 Other chest pain: Secondary | ICD-10-CM | POA: Diagnosis not present

## 2015-09-12 DIAGNOSIS — Z862 Personal history of diseases of the blood and blood-forming organs and certain disorders involving the immune mechanism: Secondary | ICD-10-CM | POA: Diagnosis not present

## 2015-09-12 DIAGNOSIS — Z8774 Personal history of (corrected) congenital malformations of heart and circulatory system: Secondary | ICD-10-CM | POA: Insufficient documentation

## 2015-09-12 DIAGNOSIS — J449 Chronic obstructive pulmonary disease, unspecified: Secondary | ICD-10-CM | POA: Insufficient documentation

## 2015-09-12 DIAGNOSIS — G8929 Other chronic pain: Secondary | ICD-10-CM | POA: Diagnosis not present

## 2015-09-12 DIAGNOSIS — Z853 Personal history of malignant neoplasm of breast: Secondary | ICD-10-CM | POA: Diagnosis not present

## 2015-09-12 DIAGNOSIS — Z79899 Other long term (current) drug therapy: Secondary | ICD-10-CM | POA: Insufficient documentation

## 2015-09-12 DIAGNOSIS — Z98811 Dental restoration status: Secondary | ICD-10-CM | POA: Diagnosis not present

## 2015-09-12 DIAGNOSIS — F419 Anxiety disorder, unspecified: Secondary | ICD-10-CM | POA: Insufficient documentation

## 2015-09-12 DIAGNOSIS — R079 Chest pain, unspecified: Secondary | ICD-10-CM | POA: Diagnosis present

## 2015-09-12 DIAGNOSIS — Z923 Personal history of irradiation: Secondary | ICD-10-CM | POA: Diagnosis not present

## 2015-09-12 MED ORDER — KETOROLAC TROMETHAMINE 30 MG/ML IJ SOLN
15.0000 mg | Freq: Once | INTRAMUSCULAR | Status: AC
  Administered 2015-09-12: 15 mg via INTRAMUSCULAR
  Filled 2015-09-12: qty 1

## 2015-09-12 MED ORDER — LORAZEPAM 1 MG PO TABS: 1.0000 mg | ORAL_TABLET | Freq: Every evening | ORAL | Status: DC | PRN

## 2015-09-12 MED ORDER — HYDROCODONE-ACETAMINOPHEN 5-325 MG PO TABS: 1.0000 | ORAL_TABLET | ORAL | Status: DC | PRN

## 2015-09-12 MED ORDER — IPRATROPIUM-ALBUTEROL 0.5-2.5 (3) MG/3ML IN SOLN
3.0000 mL | Freq: Once | RESPIRATORY_TRACT | Status: AC
  Administered 2015-09-12: 3 mL via RESPIRATORY_TRACT
  Filled 2015-09-12: qty 3

## 2015-09-12 MED ORDER — HYDROMORPHONE HCL 1 MG/ML IJ SOLN
1.0000 mg | Freq: Once | INTRAMUSCULAR | Status: AC
  Administered 2015-09-12: 1 mg via INTRAMUSCULAR
  Filled 2015-09-12: qty 1

## 2015-09-12 MED ORDER — DIAZEPAM 5 MG PO TABS
5.0000 mg | ORAL_TABLET | Freq: Once | ORAL | Status: AC
  Administered 2015-09-12: 5 mg via ORAL
  Filled 2015-09-12: qty 1

## 2015-09-12 NOTE — ED Notes (Signed)
MD at bedside. 

## 2015-09-12 NOTE — ED Provider Notes (Signed)
CSN: [id number]     Arrival date & time 09/12/15  1415 History   First MD Initiated Contact with Patient 09/12/15 1508     Chief Complaint  Patient presents with  . Pain     (Consider location/radiation/quality/duration/timing/severity/associated sxs/prior Treatment) HPI   65 year old female with left chest pain. Patient is having increasing pain in the area of the left chest wall mass. Recent imaging as below. She reports that she had a lot of difficulty sleeping last night secondary to the pain. She has been taking Tylenol and 1/2-1 tablet of oxycodone without much relief. She reports that she is hesitant to take more pain because of GI upset. No fevers or chills. Denies any acute shortness of breath. She reports that she recently has been treated for bronchitis but this has been improving. She still is having some wheezing though. No unusual leg pain or swelling. Some mild redness of the mass. No drainage. Recent port-c-cath placement R chest which has been healing well.   CT on 2/1:   1. Progressive left chest wall recurrence with an enlarging mass and several new subcutaneous metastatic nodules. 2. Progressive pulmonary metastatic disease.  Past Medical History  Diagnosis Date  . Chronic bronchitis   . Palpitation     Tachycardia reported by monitor clerk during a symptomatic spell  . Chest pain     Admitted to [facility] in 09/2011; refused stress test  . Atrial septal defect 1996    Surgical repair in 1996  . Tobacco abuse     60 pack years; 1.5 packs per day  . Anxiety   . Anemia   . Breast cancer ([place])   . Wears dentures     top  . COPD (chronic obstructive pulmonary disease) ([place])     on xray  . Chronic pain   . Radiation 06/30/14-08/17/14    Bilateral Breast   Past Surgical History  Procedure Laterality Date  . Cholecystectomy    . Cesarean section      x3  . Tubal ligation    . Asd repair, ostium primum  1996    dr Roxy Horseman  . Port a cath revision  1/15    put in    . Breast lumpectomy with radioactive seed localization Bilateral 04/08/2014    Procedure: BILATERAL  RADIOACTIVE SEED LOCALIZATION LUMPECTOMY ;  Surgeon: Autumn Messing III, MD;  Location: [place];  Service: General;  Laterality: Bilateral;  . Axillary lymph node dissection Bilateral 04/08/2014    Procedure:  BILATERAL AXILLARY LYMPH NODE DISSECTION;  Surgeon: Autumn Messing III, MD;  Location: [place];  Service: General;  Laterality: Bilateral;  . Open heart surgery    . Breast biopsy Bilateral    Family History  Problem Relation Age of Onset  . Breast cancer Maternal Aunt    Social History  Substance Use Topics  . Smoking status: Former Smoker -- 1.50 packs/day for 40 years    Types: Cigarettes    Quit date: 08/01/2012  . Smokeless tobacco: Never Used  . Alcohol Use: No   OB History    Gravida Para Term Preterm AB TAB SAB Ectopic Multiple Living   '4 3 3            '$ Review of Systems    Allergies  Codeine and Morphine and related  Home Medications   Prior to Admission medications   Medication Sig Start Date End Date Taking? Authorizing Provider  albuterol (PROVENTIL HFA;VENTOLIN HFA) 108 (90  Base) MCG/ACT inhaler Inhale 1-2 puffs into the lungs every 6 (six) hours as needed for wheezing or shortness of breath. 09/06/15   Milton Ferguson, MD  amoxicillin (AMOXIL) 500 MG capsule Take 1 capsule (500 mg total) by mouth 2 (two) times daily. 09/10/15   Nicholas Lose, MD  B Complex-C (SUPER B COMPLEX PO) Take 1 tablet by mouth daily.    Historical Provider, MD  calcium-vitamin D (OSCAL-500) 500-400 MG-UNIT tablet Take 1 tablet by mouth 2 (two) times daily. 08/18/15   Nicholas Lose, MD  gabapentin (NEURONTIN) 300 MG capsule TAKE 2 CAPSULES (600 MG TOTAL) BY MOUTH 3 (THREE) TIMES DAILY. 06/14/15   Nicholas Lose, MD  guaiFENesin-dextromethorphan (ROBITUSSIN DM) 100-10 MG/5ML syrup Take 5 mLs by mouth every 4 (four) hours as needed for cough.    Historical Provider, MD   ibuprofen (ADVIL,MOTRIN) 200 MG tablet Take 200 mg by mouth every 6 (six) hours as needed for moderate pain.    Historical Provider, MD  lidocaine-prilocaine (EMLA) cream Apply to affected area once Patient taking differently: Apply 1 application topically as needed (has not started). Apply to affected area once 09/03/15   Nicholas Lose, MD  LORazepam (ATIVAN) 1 MG tablet Take 1 mg by mouth at bedtime.  06/17/15   Historical Provider, MD  ondansetron (ZOFRAN) 8 MG tablet Take 1 tablet (8 mg total) by mouth 2 (two) times daily as needed for refractory nausea / vomiting. Start on day 3 after carboplatin chemo. 09/03/15   Nicholas Lose, MD  oxyCODONE-acetaminophen (PERCOCET/ROXICET) 5-325 MG tablet 1 tabs PO q6h prn pain Patient taking differently: Take 0.5-1 tablets by mouth every 6 (six) hours as needed for moderate pain. 1 tabs PO q6h prn pain 08/27/15   Nicholas Lose, MD  predniSONE (DELTASONE) 20 MG tablet 2 tabs po daily x 3 days 09/06/15   Milton Ferguson, MD  prochlorperazine (COMPAZINE) 10 MG tablet Take 1 tablet (10 mg total) by mouth every 6 (six) hours as needed (Nausea or vomiting). 09/03/15   Nicholas Lose, MD   BP 117/80 mmHg  Pulse 94  Temp(Src) 99.3 F (37.4 C) (Oral)  Resp 20  Ht '5\' 2"'$  (1.575 m)  Wt 139 lb (63.05 kg)  BMI 25.42 kg/m2  SpO2 94%  LMP 05/20/2011 Physical Exam  Constitutional: She appears well-developed and well-nourished. No distress.  HENT:  Head: Normocephalic and atraumatic.  Eyes: Conjunctivae are normal. Right eye exhibits no discharge. Left eye exhibits no discharge.  Neck: Neck supple.  Cardiovascular: Normal rate, regular rhythm and normal heart sounds.  Exam reveals no gallop and no friction rub.   No murmur heard. Pulmonary/Chest: Effort normal and breath sounds normal. No respiratory distress.  Large, nonmobile mass above the left breast. Tender to palpation. Minimal overlying erythema. No increased warmth. No drainage. Skin thickening around L breast.    Abdominal: Soft. She exhibits no distension. There is no tenderness.  Musculoskeletal: She exhibits no edema or tenderness.  Neurological: She is alert.  Skin: Skin is warm and dry.  Psychiatric: She has a normal mood and affect. Her behavior is normal. Thought content normal.  Nursing note and vitals reviewed.   ED Course  Procedures (including critical care time) Labs Review Labs Reviewed - No data to display  Imaging Review No results found. I have personally reviewed and evaluated these images and lab results as part of my medical decision-making.   EKG Interpretation None      MDM   Final diagnoses:  Left-sided chest wall pain  65 year old female with left chest pain secondary to left chest wall mass. No acute complaints otherwise. Minimal overlying erythema. Clinically doubt infectious process. Will treat symptoms. Certainly can increase or change home medications to try and make her more comfortable. Some wheezing on exam, but no respiratory distress. Will give a neb while in ED.   Feels much better after a dose of dilaudid, toradol and valium. Discussed can increase the amount of oxycodone she is taking to 1-2 tablets and increase frequency up to q4h PRN. She would rather try something different. I do not have an issue with this for cancer related pain. Also requesting lorazepam to help her sleep at night. She has previously taken this. Hopefully she will be more comfortable with these meds. Advised to speak with her oncologist if she is not to discuss other options for pain management.     Virgel Manifold, MD 09/12/15 [phone]

## 2015-09-12 NOTE — ED Notes (Signed)
Patient brought in via EMS from home, Airway patent. Alert and oriented. Patient is cancer patient being treated with chemo. Last chemo treatment on Friday. Patient requesting pain management for pain in left chest that radiates under left arm. Patient states pain has been there but is not being relieved with home pain medication. Per patient mass in left chest , unable to operate due to location. Patient reports having port-a-cath placed on Thursday and accessed on Friday. Patient taking oxycodone and tylenol with no relief. Per patient called nurse and was told to come to ER.

## 2015-09-13 ENCOUNTER — Other Ambulatory Visit (HOSPITAL_COMMUNITY): Payer: Medicare Other

## 2015-09-13 ENCOUNTER — Ambulatory Visit (HOSPITAL_COMMUNITY): Payer: Medicare Other

## 2015-09-14 ENCOUNTER — Ambulatory Visit: Payer: Medicare Other

## 2015-09-16 ENCOUNTER — Other Ambulatory Visit (HOSPITAL_COMMUNITY): Payer: Medicare Other

## 2015-09-16 ENCOUNTER — Telehealth: Payer: Self-pay | Admitting: *Deleted

## 2015-09-16 NOTE — Telephone Encounter (Signed)
FYI "I ran out of hydrocodone.  Can I take t he oxycodone for pain?"  I have an appointment tomorrow.  I accidentally hit my arm going through a doorway.  It's black and blue." Okay to use generic percocet for pain.  Also instructed to use cool compresses at 15 minute intervals to arm elevate arm on pillows.

## 2015-09-16 NOTE — Assessment & Plan Note (Signed)
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 N1 M0 stage IIB  Radiology CT chest abdomen pelvis 05/28/2015: Developing subpectoral masses 3.5 cm and 4.5 cm. Four lung nodules the largest 1.4 cm in the right lung 1.7 cm sclerotic right iliac bone lesion concerning for metastatic disease Left subpectoral mass biopsy 06/21/2015: Invasive high-grade ductal carcinoma ER 5%, PR 0%, HER-2 negative ratio 1.29 Prior treatment: Xeloda 1500 mg by mouth twice a day started 07/07/2015 stopped 09/03/2015 (stopped for progression)  Bone metastases: Xgeva every 6 weeks with Calcium + D started January 2017 CT chest abdomen pelvis 09/01/15: Progressive left chest wall recurrence with an enlarging mass and severa; new sub cutaneous nodules. Progressive lung nodules -------------------------------------------------------------------------------------------------------------------------- Treatment plan: Carboplatin day 1 and gemcitabine days 1 and 8 every 3 weeks with Neulasta started 09/10/2015 I discussed the chemotherapy regimen detail. Unfortunately the left-sided chest wall tumor is rapidly growing in size. It is now starting to cause pain and discomfort. She is taking narcotic pain medications which are alleviating her pain. RTC in 2 weeks for cycle 2

## 2015-09-17 ENCOUNTER — Ambulatory Visit: Payer: Medicare Other | Admitting: Nurse Practitioner

## 2015-09-17 ENCOUNTER — Ambulatory Visit: Payer: Medicare Other | Admitting: Hematology and Oncology

## 2015-09-17 ENCOUNTER — Other Ambulatory Visit: Payer: Medicare Other

## 2015-09-17 ENCOUNTER — Encounter: Payer: Self-pay | Admitting: Hematology and Oncology

## 2015-09-17 ENCOUNTER — Ambulatory Visit: Payer: Medicare Other

## 2015-09-17 ENCOUNTER — Telehealth: Payer: Self-pay | Admitting: Hematology and Oncology

## 2015-09-17 ENCOUNTER — Ambulatory Visit (HOSPITAL_BASED_OUTPATIENT_CLINIC_OR_DEPARTMENT_OTHER): Payer: Medicare Other

## 2015-09-17 ENCOUNTER — Ambulatory Visit (HOSPITAL_BASED_OUTPATIENT_CLINIC_OR_DEPARTMENT_OTHER): Payer: Medicare Other | Admitting: Hematology and Oncology

## 2015-09-17 ENCOUNTER — Other Ambulatory Visit (HOSPITAL_BASED_OUTPATIENT_CLINIC_OR_DEPARTMENT_OTHER): Payer: Medicare Other

## 2015-09-17 VITALS — BP 140/80 | HR 86 | Temp 98.3°F | Resp 18 | Ht 62.0 in | Wt 137.6 lb

## 2015-09-17 DIAGNOSIS — C7951 Secondary malignant neoplasm of bone: Secondary | ICD-10-CM | POA: Diagnosis not present

## 2015-09-17 DIAGNOSIS — C50919 Malignant neoplasm of unspecified site of unspecified female breast: Secondary | ICD-10-CM

## 2015-09-17 DIAGNOSIS — Z5111 Encounter for antineoplastic chemotherapy: Secondary | ICD-10-CM

## 2015-09-17 DIAGNOSIS — C50511 Malignant neoplasm of lower-outer quadrant of right female breast: Secondary | ICD-10-CM

## 2015-09-17 DIAGNOSIS — C50911 Malignant neoplasm of unspecified site of right female breast: Secondary | ICD-10-CM

## 2015-09-17 DIAGNOSIS — R0789 Other chest pain: Secondary | ICD-10-CM

## 2015-09-17 DIAGNOSIS — C7989 Secondary malignant neoplasm of other specified sites: Secondary | ICD-10-CM

## 2015-09-17 DIAGNOSIS — C50512 Malignant neoplasm of lower-outer quadrant of left female breast: Principal | ICD-10-CM

## 2015-09-17 DIAGNOSIS — C50912 Malignant neoplasm of unspecified site of left female breast: Secondary | ICD-10-CM

## 2015-09-17 DIAGNOSIS — I890 Lymphedema, not elsewhere classified: Secondary | ICD-10-CM

## 2015-09-17 DIAGNOSIS — F419 Anxiety disorder, unspecified: Secondary | ICD-10-CM | POA: Diagnosis not present

## 2015-09-17 DIAGNOSIS — R2232 Localized swelling, mass and lump, left upper limb: Secondary | ICD-10-CM

## 2015-09-17 DIAGNOSIS — G8929 Other chronic pain: Secondary | ICD-10-CM

## 2015-09-17 LAB — COMPREHENSIVE METABOLIC PANEL
ALK PHOS: 73 U/L (ref 40–150)
ALT: 60 U/L — ABNORMAL HIGH (ref 0–55)
ANION GAP: 10 meq/L (ref 3–11)
AST: 48 U/L — ABNORMAL HIGH (ref 5–34)
Albumin: 3.4 g/dL — ABNORMAL LOW (ref 3.5–5.0)
BILIRUBIN TOTAL: 0.87 mg/dL (ref 0.20–1.20)
BUN: 10.5 mg/dL (ref 7.0–26.0)
CO2: 27 meq/L (ref 22–29)
Calcium: 9.7 mg/dL (ref 8.4–10.4)
Chloride: 100 mEq/L (ref 98–109)
Creatinine: 0.8 mg/dL (ref 0.6–1.1)
EGFR: 87 mL/min/{1.73_m2} — AB (ref >90)
Glucose: 140 mg/dl (ref 70–140)
Potassium: 4.3 mEq/L (ref 3.5–5.1)
Sodium: 137 mEq/L (ref 136–145)
TOTAL PROTEIN: 7.1 g/dL (ref 6.4–8.3)

## 2015-09-17 LAB — CBC WITH DIFFERENTIAL/PLATELET
BASO%: 0.4 % (ref 0.0–2.0)
BASOS ABS: 0 10*3/uL (ref 0.0–0.1)
EOS ABS: 0.1 10*3/uL (ref 0.0–0.5)
EOS%: 1.1 % (ref 0.0–7.0)
HCT: 30.7 % — ABNORMAL LOW (ref 34.8–46.6)
HGB: 10 g/dL — ABNORMAL LOW (ref 11.6–15.9)
LYMPH%: 36.7 % (ref 14.0–49.7)
MCH: 24.7 pg — AB (ref 25.1–34.0)
MCHC: 32.6 g/dL (ref 31.5–36.0)
MCV: 75.8 fL — AB (ref 79.5–101.0)
MONO#: 0.5 10*3/uL (ref 0.1–0.9)
MONO%: 9.6 % (ref 0.0–14.0)
NEUT%: 52.2 % (ref 38.4–76.8)
NEUTROS ABS: 2.9 10*3/uL (ref 1.5–6.5)
PLATELETS: 152 10*3/uL (ref 145–400)
RBC: 4.05 10*6/uL (ref 3.70–5.45)
RDW: 17.8 % — ABNORMAL HIGH (ref 11.2–14.5)
WBC: 5.6 10*3/uL (ref 3.9–10.3)
lymph#: 2.1 10*3/uL (ref 0.9–3.3)

## 2015-09-17 MED ORDER — DENOSUMAB 120 MG/1.7ML ~~LOC~~ SOLN: 120.0000 mg | Freq: Once | SUBCUTANEOUS | Status: DC

## 2015-09-17 MED ORDER — SODIUM CHLORIDE 0.9% FLUSH
10.0000 mL | INTRAVENOUS | Status: DC | PRN
  Administered 2015-09-17: 10 mL
  Filled 2015-09-17: qty 10

## 2015-09-17 MED ORDER — PEGFILGRASTIM 6 MG/0.6ML ~~LOC~~ PSKT
6.0000 mg | PREFILLED_SYRINGE | Freq: Once | SUBCUTANEOUS | Status: DC
Start: 2015-09-17 — End: 2015-09-17
  Filled 2015-09-17: qty 0.6

## 2015-09-17 MED ORDER — SODIUM CHLORIDE 0.9 % IV SOLN
Freq: Once | INTRAVENOUS | Status: AC
  Administered 2015-09-17: 13:00:00 via INTRAVENOUS

## 2015-09-17 MED ORDER — SODIUM CHLORIDE 0.9 % IJ SOLN
10.0000 mL | Freq: Once | INTRAMUSCULAR | Status: AC
  Administered 2015-09-17: 10 mL via INTRAVENOUS
  Filled 2015-09-17: qty 10

## 2015-09-17 MED ORDER — TEMAZEPAM 30 MG PO CAPS: 30.0000 mg | ORAL_CAPSULE | Freq: Every evening | ORAL | Status: DC | PRN

## 2015-09-17 MED ORDER — HYDROMORPHONE HCL 4 MG/ML IJ SOLN
1.0000 mg | Freq: Once | INTRAMUSCULAR | Status: AC
Start: 2015-09-17 — End: 2015-09-17
  Administered 2015-09-17: 1 mg via INTRAVENOUS

## 2015-09-17 MED ORDER — CEPHALEXIN 500 MG PO CAPS: 500.0000 mg | ORAL_CAPSULE | Freq: Four times a day (QID) | ORAL | Status: DC

## 2015-09-17 MED ORDER — HYDROMORPHONE HCL 4 MG/ML IJ SOLN
INTRAMUSCULAR | Status: AC
  Filled 2015-09-17: qty 1

## 2015-09-17 MED ORDER — HEPARIN SOD (PORK) LOCK FLUSH 100 UNIT/ML IV SOLN
500.0000 [IU] | Freq: Once | INTRAVENOUS | Status: AC | PRN
Start: 2015-09-17 — End: 2015-09-17
  Administered 2015-09-17: 500 [IU]
  Filled 2015-09-17: qty 5

## 2015-09-17 MED ORDER — VANCOMYCIN HCL 1000 MG IV SOLR
1000.0000 mg | Freq: Once | INTRAVENOUS | Status: AC
  Administered 2015-09-17: 1000 mg via INTRAVENOUS
  Filled 2015-09-17: qty 1000

## 2015-09-17 MED ORDER — SODIUM CHLORIDE 0.9 % IV SOLN
800.0000 mg/m2 | Freq: Once | INTRAVENOUS | Status: AC
  Administered 2015-09-17: 1368 mg via INTRAVENOUS
  Filled 2015-09-17: qty 35.98

## 2015-09-17 MED ORDER — PROCHLORPERAZINE MALEATE 10 MG PO TABS
10.0000 mg | ORAL_TABLET | Freq: Once | ORAL | Status: AC
  Administered 2015-09-17: 10 mg via ORAL

## 2015-09-17 MED ORDER — OXYCODONE HCL ER 10 MG PO T12A: 10.0000 mg | EXTENDED_RELEASE_TABLET | Freq: Two times a day (BID) | ORAL | Status: DC

## 2015-09-17 MED ORDER — PROCHLORPERAZINE MALEATE 10 MG PO TABS
ORAL_TABLET | ORAL | Status: AC
  Filled 2015-09-17: qty 1

## 2015-09-17 MED FILL — TEMAZEPAM 30 MG CAPSULE: 30 | 30 days supply | Qty: 30 | Fill #0

## 2015-09-17 NOTE — Patient Instructions (Signed)

## 2015-09-17 NOTE — Telephone Encounter (Signed)
Pt has a 2 week appt already scheduled per 2/17 pof. avs printed

## 2015-09-17 NOTE — Progress Notes (Signed)
Unable to get in to exam room prior to MD.  No assessment performed.  

## 2015-09-17 NOTE — Patient Instructions (Signed)
[facility] Discharge Instructions for Patients Receiving Chemotherapy  Today you received the following chemotherapy agents Gemzar To help prevent nausea and vomiting after your treatment, we encourage you to take your nausea medication as prescribed.   If you develop nausea and vomiting that is not controlled by your nausea medication, call the clinic.   BELOW ARE SYMPTOMS THAT SHOULD BE REPORTED IMMEDIATELY:  *FEVER GREATER THAN 100.5 F  *CHILLS WITH OR WITHOUT FEVER  NAUSEA AND VOMITING THAT IS NOT CONTROLLED WITH YOUR NAUSEA MEDICATION  *UNUSUAL SHORTNESS OF BREATH  *UNUSUAL BRUISING OR BLEEDING  TENDERNESS IN MOUTH AND THROAT WITH OR WITHOUT PRESENCE OF ULCERS  *URINARY PROBLEMS  *BOWEL PROBLEMS  UNUSUAL RASH Items with * indicate a potential emergency and should be followed up as soon as possible.  Feel free to call the clinic you have any questions or concerns. The clinic phone number is [phone].  Please show the [facility] at check-in to the Emergency Department and triage nurse.   Vancomycin injection What is this medicine? VANCOMYCIN Lucianne Lei koe MYE sin) is a glycopeptide antibiotic. It is used to treat certain kinds of bacterial infections. It will not work for colds, flu, or other viral infections. This medicine may be used for other purposes; ask your health care provider or pharmacist if you have questions. What should I tell my health care provider before I take this medicine? They need to know if you have any of these conditions: -dehydration -hearing loss -kidney disease -other chronic illness -an unusual or allergic reaction to vancomycin, other medicines, foods, dyes, or preservatives -pregnant or trying to get pregnant -breast-feeding How should I use this medicine? This medicine is infused into a vein. It is usually given by a health care provider in a hospital or clinic. If you receive this medicine at home, you will  receive special instructions. Take your medicine at regular intervals. Do not take your medicine more often than directed. Take all of your medicine as directed even if you think you are better. Do not skip doses or stop your medicine early. It is important that you put your used needles and syringes in a special sharps container. Do not put them in a trash can. If you do not have a sharps container, call your pharmacist or healthcare provider to get one. Talk to your pediatrician regarding the use of this medicine in children. While this drug may be prescribed for even very young infants for selected conditions, precautions do apply. Overdosage: If you think you have taken too much of this medicine contact a poison control center or emergency room at once. NOTE: This medicine is only for you. Do not share this medicine with others. What if I miss a dose? If you miss a dose, take it as soon as you can. If it is almost time for your next dose, take only that dose. Do not take double or extra doses. What may interact with this medicine? -amphotericin B -anesthetics -bacitracin -birth control pills -cisplatin -colistin -diuretics -other aminoglycoside antibiotics -polymyxin B This list may not describe all possible interactions. Give your health care provider a list of all the medicines, herbs, non-prescription drugs, or dietary supplements you use. Also tell them if you smoke, drink alcohol, or use illegal drugs. Some items may interact with your medicine. What should I watch for while using this medicine? Tell your doctor or health care professional if your symptoms do not improve or if you get new symptoms.  Your condition and lab work will be monitored while you are taking this medicine. Do not treat diarrhea with over the counter products. Contact your doctor if you have diarrhea that lasts more than 2 days or if it is severe and watery. What side effects may I notice from receiving this  medicine? Side effects that you should report to your doctor or health care professional as soon as possible: -allergic reactions like skin rash, itching or hives, swelling of the face, lips, or tongue -breathing difficulty, wheezing -change in amount, color of urine -change in hearing -chest pain -dizziness -fever, chills -flushing of the face and neck (reddening) -low blood pressure -redness, blistering, peeling or loosening of the skin, including inside the mouth -unusual bleeding or bruising -unusually weak or tired Side effects that usually do not require medical attention (report to your doctor or health care professional if they continue or are bothersome): -nausea, vomiting -pain, swelling where injected -stomach cramps This list may not describe all possible side effects. Call your doctor for medical advice about side effects. You may report side effects to FDA at 1-800-FDA-1088. Where should I keep my medicine? Keep out of the reach of children. You will be instructed on how to store this medicine, if needed. Throw away any unused medicine after the expiration date on the label. NOTE: This sheet is a summary. It may not cover all possible information. If you have questions about this medicine, talk to your doctor, pharmacist, or health care provider.    2016, [employer]. (2013-02-21 14:46:02)

## 2015-09-17 NOTE — Progress Notes (Signed)
Patient complains of swelling into her left arm and left hand. Patient reports site on her chest is hot and painful. Selena Lesser, NP notified. Dilaudid 1 mg given IV push. Selena Lesser, NP chairside.

## 2015-09-17 NOTE — Progress Notes (Signed)
Met with patient in treatment who had some financial questions or concerns regarding her bills. Reviewed patient's billing and saw that she had an amount in self-pay that could be sent to a foundation for copay assistance. Reviewed her notes and learned that patient had copay assistance through [employer]. Called [employer] to obtain paperwork and find out who applied for the patient. [facility] form pharmacy enrolled patient. [employer] faxed paperwork to me. I emailed award letter and POE to billing and asked that they pull that date of service and send to the foundation for payment. Patient also wanted approval for additional gas cards. Discussed with Chrisitine from [employer]. Went back to meet with patient and gave her the updates. Also gave patient an application for the [employer]. Patient has my card for any additional questions or concerns.

## 2015-09-18 ENCOUNTER — Encounter: Payer: Self-pay | Admitting: Hematology and Oncology

## 2015-09-18 NOTE — Progress Notes (Signed)
Patient Care Team: [name], MD as PCP - General (Family Medicine) [name], MD (Cardiology)  DIAGNOSIS: Bilateral breast cancer [facility])   Staging form: Breast, AJCC 7th Edition     Clinical: Stage IIB (T2, N1, cM0) - Unsigned       Staging comments: Staged at breast conference 08/13/13      Pathologic: No stage assigned - Unsigned   SUMMARY OF ONCOLOGIC HISTORY:   Bilateral breast cancer ([place])   07/23/2013 Mammogram Bilateral breast masses. 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 N1 M0 stage IIB ER 100%, PR 0%, HER-2    06/17/2014 -  Radiation Therapy Adjuvant radiation therapy   09/14/2014 -  Anti-estrogen oral therapy Anastrozole 1 mg daily   05/28/2015 Imaging CT scans: Enlarging subpectoral masses 3.1 x 3.5 cm, posteriorly lower density mass 4.5 x 2.1 cm, several right-sided lung nodules right lower lobe 1.4 cm, 3 other right lung nodules, 1.7 cm right iliac bone lesion   06/21/2015 Procedure Left subpectoral mass biopsy: Invasive high-grade  ductal carcinoma ER 5%, PR 0%, HER-2 negative ratio 1.29   09/01/2015 Imaging Left chest wall mass increased in size 7.2 x 5.1 cm, multiple subcutaneous nodules, increase in the lung nodules both in number as well as in the size of existing nodules    CHIEF COMPLIANT: cycle 1 day 8 Carbo Gem  INTERVAL HISTORY: Paula Navarro is a 65 yr old with a large chest wall mass ext into left axilla, is here for cycle 1 day 8 chemo. She hit against the freezer door at the grocery store and after that, it turned red and is causing her pain. She went to ER and was given pain medication. She contiues to have lymphadema of the arm and pain in the chest wall mass.  REVIEW OF SYSTEMS:   Constitutional: Denies fevers, chills or abnormal weight loss Eyes: Denies blurriness of vision Ears, nose, mouth, throat, and face: Denies mucositis or sore throat Respiratory: Denies cough, dyspnea or wheezes Cardiovascular: Denies palpitation, chest discomfort Gastrointestinal:  Denies nausea, heartburn or change in bowel habits Skin: Denies abnormal skin rashes Lymphatics: Denies new lymphadenopathy or easy bruising Neurological:Denies numbness, tingling or new weaknesses Behavioral/Psych: Mood is stable, no new changes  Extremities: No lower extremity edema Breast: Left chest wall mass painful and red All other systems were reviewed with the patient and are negative.  I have reviewed the past medical history, past surgical history, social history and family history with the patient and they are unchanged from previous note.  ALLERGIES:  is allergic to codeine and morphine and related.  MEDICATIONS:  Current Outpatient Prescriptions  Medication Sig Dispense Refill  . albuterol (PROVENTIL HFA;VENTOLIN HFA) 108 (90 Base) MCG/ACT inhaler Inhale 1-2 puffs into the lungs every 6 (six) hours as needed for wheezing or shortness of breath. 1 Inhaler 0  . B Complex-C (SUPER B COMPLEX PO) Take 1 tablet by mouth daily.    .  calcium-vitamin D (OSCAL-500) 500-400 MG-UNIT tablet Take 1 tablet by mouth 2 (two) times daily. 60 tablet 3  . cephALEXin (KEFLEX) 500 MG capsule Take 1 capsule (500 mg total) by mouth 4 (four) times daily. 40 capsule 0  . gabapentin (NEURONTIN) 300 MG capsule TAKE 2 CAPSULES (600 MG TOTAL) BY MOUTH 3 (THREE) TIMES DAILY. 180 capsule 5  . lidocaine-prilocaine (EMLA) cream Apply to affected area once (Patient taking differently: Apply 1 application topically as needed. Apply to affected area once) 30 g 3  . ondansetron (ZOFRAN) 8 MG tablet Take 1 tablet (8 mg total) by mouth 2 (two) times daily as needed for refractory nausea / vomiting. Start on day 3 after carboplatin chemo. 30 tablet 1  . oxyCODONE (OXYCONTIN) 10 mg 12 hr tablet Take 1 tablet (10 mg total) by mouth every 12 (twelve) hours. 60 tablet 0  . oxyCODONE-acetaminophen (PERCOCET/ROXICET) 5-325 MG tablet 1 tabs PO q6h prn pain (Patient taking differently: Take 0.5-1 tablets by mouth every 6 (six) hours as needed for moderate pain. 1 tabs PO q6h prn pain) 60 tablet 0  . prochlorperazine (COMPAZINE) 10 MG tablet Take 1 tablet (10 mg total) by mouth every 6 (six) hours as needed (Nausea or vomiting). 30 tablet 1  . temazepam (RESTORIL) 30 MG capsule Take 1 capsule (30 mg total) by mouth at bedtime as needed for sleep. 30 capsule 0   No current facility-administered medications for this visit.    PHYSICAL EXAMINATION: ECOG PERFORMANCE STATUS: 1  Filed Vitals:   09/17/15 1230  BP: 140/80  Pulse: 86  Temp: 98.3 F (36.8 C)  Resp: 18   Filed Weights   09/17/15 1230  Weight: 137 lb 9.6 oz (62.415 kg)    GENERAL:alert, no distress and comfortable SKIN: skin color, texture, turgor are normal, no rashes or significant lesions EYES: normal, Conjunctiva are pink and non-injected, sclera clear OROPHARYNX:no exudate, no erythema and lips, buccal mucosa, and tongue normal  NECK: supple, thyroid normal size, non-tender, without  nodularity LYMPH:  no palpable lymphadenopathy in the cervical, axillary or inguinal LUNGS: clear to auscultation and percussion with normal breathing effort HEART: regular rate & rhythm and no murmurs and no lower extremity edema ABDOMEN:abdomen soft, non-tender and normal bowel sounds MUSCULOSKELETAL:no cyanosis of digits and no clubbing  NEURO: alert & oriented x 3 with fluent speech, no focal motor/sensory deficits EXTREMITIES: No lower extremity edema BREAST:Redness and pain involving the chest wall mass  LABORATORY DATA:  I have reviewed the data as listed   Chemistry      Component Value Date/Time   NA 137 09/17/2015 1134   NA 133* 01/07/2015 1409   K 4.3 09/17/2015 1134   K 4.3 01/07/2015 1409   CL 95* 01/07/2015 1409   CO2 27 09/17/2015 1134   CO2 26 01/07/2015 1409   BUN 10.5 09/17/2015 1134   BUN 10 01/07/2015 1409   CREATININE 0.8 09/17/2015 1134   CREATININE 0.79 01/07/2015 1409      Component Value Date/Time   CALCIUM 9.7 09/17/2015 1134   CALCIUM 9.3 01/07/2015 1409  ALKPHOS 73 09/17/2015 1134   AST 48* 09/17/2015 1134   ALT 60* 09/17/2015 1134   BILITOT 0.87 09/17/2015 1134       Lab Results  Component Value Date   WBC 5.6 09/17/2015   HGB 10.0* 09/17/2015   HCT 30.7* 09/17/2015   MCV 75.8* 09/17/2015   PLT 152 09/17/2015   NEUTROABS 2.9 09/17/2015     ASSESSMENT & PLAN:  Bilateral breast cancer ([place]) Treated with neoadjuvant chemotherapy followed by surgery 04/08/2014, radiation treatment completed 07/17/2014, antiestrogen therapy with anastrozole 09/14/2014 Left breast T2, N0, M0, IDC grade 3; 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 N1 M0 stage IIB  Radiology CT chest abdomen pelvis 05/28/2015: Developing subpectoral masses 3.5 cm and 4.5 cm. Four lung nodules the largest 1.4 cm in  the right lung 1.7 cm sclerotic right iliac bone lesion concerning for metastatic disease Left subpectoral mass biopsy 06/21/2015: Invasive high-grade ductal carcinoma ER 5%, PR 0%, HER-2 negative ratio 1.29 Prior treatment: Xeloda 1500 mg by mouth twice a day started 07/07/2015 stopped 09/03/2015 (stopped for progression)  Bone metastases: Xgeva every 6 weeks with Calcium + D started January 2017 CT chest abdomen pelvis 09/01/15: Progressive left chest wall recurrence with an enlarging mass and severa; new sub cutaneous nodules. Progressive lung nodules -------------------------------------------------------------------------------------------------------------------------- Treatment plan: Carboplatin day 1 and gemcitabine days 1 and 8 every 3 weeks with Neulasta started 09/10/2015 Current treatment: Today is cycle day 8 Carboplatin and Gemcitabine  Left chest wall mass redness and pain: Patient bumber into a door and since then its hurting more. Also has lymphadema in the left arm. Because of redness and pain, I gave her a dose of IV vancomycin and she was strated on Keflex by our Symptom management NP.  Pain issues: we added Oxycontin long acting pain medication today. I gave her instructions on how to take the medication  Insomnia: Will D/C Ativan and gave her restoril. Instructed her not to take pain meds and restoril at the same time.  RTC in 2 weeks for cycle 2   No orders of the defined types were placed in this encounter.   The patient has a good understanding of the overall plan. she agrees with it. she will call with any problems that may develop before the next visit here.   Rulon Eisenmenger, MD 09/18/2015

## 2015-09-20 ENCOUNTER — Encounter: Payer: Self-pay | Admitting: Nurse Practitioner

## 2015-09-20 ENCOUNTER — Encounter (HOSPITAL_COMMUNITY): Payer: Self-pay | Admitting: *Deleted

## 2015-09-20 ENCOUNTER — Encounter (HOSPITAL_COMMUNITY): Payer: Self-pay

## 2015-09-20 ENCOUNTER — Ambulatory Visit (HOSPITAL_BASED_OUTPATIENT_CLINIC_OR_DEPARTMENT_OTHER): Payer: Medicare Other | Admitting: Nurse Practitioner

## 2015-09-20 ENCOUNTER — Ambulatory Visit: Payer: Medicare Other

## 2015-09-20 ENCOUNTER — Emergency Department (HOSPITAL_COMMUNITY): Payer: Medicare Other

## 2015-09-20 ENCOUNTER — Encounter: Payer: Self-pay | Admitting: Hematology and Oncology

## 2015-09-20 ENCOUNTER — Ambulatory Visit (HOSPITAL_COMMUNITY)
Admission: RE | Admit: 2015-09-20 | Discharge: 2015-09-20 | Disposition: A | Discharge status: Home / Self Care | Payer: Medicare Other | Source: Ambulatory Visit | Attending: Nurse Practitioner | Admitting: Nurse Practitioner

## 2015-09-20 ENCOUNTER — Emergency Department (HOSPITAL_COMMUNITY)
Admission: EM | Admit: 2015-09-20 | Discharge: 2015-09-21 | Disposition: A | Discharge status: Home / Self Care | Payer: Medicare Other | Attending: Emergency Medicine | Admitting: Emergency Medicine

## 2015-09-20 VITALS — BP 131/80 | HR 90 | Temp 98.1°F | Resp 18 | Ht 62.0 in | Wt 138.6 lb

## 2015-09-20 DIAGNOSIS — F419 Anxiety disorder, unspecified: Secondary | ICD-10-CM | POA: Insufficient documentation

## 2015-09-20 DIAGNOSIS — R599 Enlarged lymph nodes, unspecified: Secondary | ICD-10-CM | POA: Insufficient documentation

## 2015-09-20 DIAGNOSIS — Z923 Personal history of irradiation: Secondary | ICD-10-CM | POA: Diagnosis not present

## 2015-09-20 DIAGNOSIS — C7800 Secondary malignant neoplasm of unspecified lung: Secondary | ICD-10-CM

## 2015-09-20 DIAGNOSIS — Z5189 Encounter for other specified aftercare: Secondary | ICD-10-CM

## 2015-09-20 DIAGNOSIS — Z9889 Other specified postprocedural states: Secondary | ICD-10-CM | POA: Diagnosis not present

## 2015-09-20 DIAGNOSIS — C7951 Secondary malignant neoplasm of bone: Secondary | ICD-10-CM | POA: Diagnosis not present

## 2015-09-20 DIAGNOSIS — C50812 Malignant neoplasm of overlapping sites of left female breast: Secondary | ICD-10-CM | POA: Insufficient documentation

## 2015-09-20 DIAGNOSIS — Z9851 Tubal ligation status: Secondary | ICD-10-CM | POA: Diagnosis not present

## 2015-09-20 DIAGNOSIS — Z8774 Personal history of (corrected) congenital malformations of heart and circulatory system: Secondary | ICD-10-CM | POA: Diagnosis not present

## 2015-09-20 DIAGNOSIS — Z98811 Dental restoration status: Secondary | ICD-10-CM | POA: Insufficient documentation

## 2015-09-20 DIAGNOSIS — Z87891 Personal history of nicotine dependence: Secondary | ICD-10-CM | POA: Insufficient documentation

## 2015-09-20 DIAGNOSIS — C50511 Malignant neoplasm of lower-outer quadrant of right female breast: Secondary | ICD-10-CM

## 2015-09-20 DIAGNOSIS — G8929 Other chronic pain: Secondary | ICD-10-CM | POA: Insufficient documentation

## 2015-09-20 DIAGNOSIS — I700 Atherosclerosis of aorta: Secondary | ICD-10-CM | POA: Diagnosis not present

## 2015-09-20 DIAGNOSIS — R509 Fever, unspecified: Secondary | ICD-10-CM | POA: Insufficient documentation

## 2015-09-20 DIAGNOSIS — C7802 Secondary malignant neoplasm of left lung: Secondary | ICD-10-CM | POA: Diagnosis not present

## 2015-09-20 DIAGNOSIS — J449 Chronic obstructive pulmonary disease, unspecified: Secondary | ICD-10-CM | POA: Diagnosis not present

## 2015-09-20 DIAGNOSIS — Z79899 Other long term (current) drug therapy: Secondary | ICD-10-CM | POA: Insufficient documentation

## 2015-09-20 DIAGNOSIS — C50919 Malignant neoplasm of unspecified site of unspecified female breast: Secondary | ICD-10-CM

## 2015-09-20 DIAGNOSIS — Z792 Long term (current) use of antibiotics: Secondary | ICD-10-CM | POA: Insufficient documentation

## 2015-09-20 DIAGNOSIS — K5900 Constipation, unspecified: Secondary | ICD-10-CM

## 2015-09-20 DIAGNOSIS — C50912 Malignant neoplasm of unspecified site of left female breast: Secondary | ICD-10-CM

## 2015-09-20 DIAGNOSIS — C50811 Malignant neoplasm of overlapping sites of right female breast: Secondary | ICD-10-CM | POA: Insufficient documentation

## 2015-09-20 DIAGNOSIS — Z853 Personal history of malignant neoplasm of breast: Secondary | ICD-10-CM | POA: Insufficient documentation

## 2015-09-20 DIAGNOSIS — Z862 Personal history of diseases of the blood and blood-forming organs and certain disorders involving the immune mechanism: Secondary | ICD-10-CM | POA: Insufficient documentation

## 2015-09-20 DIAGNOSIS — C7801 Secondary malignant neoplasm of right lung: Secondary | ICD-10-CM | POA: Diagnosis not present

## 2015-09-20 DIAGNOSIS — Z9049 Acquired absence of other specified parts of digestive tract: Secondary | ICD-10-CM | POA: Insufficient documentation

## 2015-09-20 DIAGNOSIS — C50911 Malignant neoplasm of unspecified site of right female breast: Secondary | ICD-10-CM

## 2015-09-20 DIAGNOSIS — C50512 Malignant neoplasm of lower-outer quadrant of left female breast: Secondary | ICD-10-CM

## 2015-09-20 LAB — CBC WITH DIFFERENTIAL/PLATELET
BASOS ABS: 0 10*3/uL (ref 0.0–0.1)
BASOS PCT: 0 %
EOS ABS: 0.1 10*3/uL (ref 0.0–0.7)
EOS PCT: 0 %
HCT: 30.1 % — ABNORMAL LOW (ref 36.0–46.0)
Hemoglobin: 9.7 g/dL — ABNORMAL LOW (ref 12.0–15.0)
Lymphocytes Relative: 5 %
Lymphs Abs: 1.1 10*3/uL (ref 0.7–4.0)
MCH: 24.7 pg — AB (ref 26.0–34.0)
MCHC: 32.2 g/dL (ref 30.0–36.0)
MCV: 76.6 fL — AB (ref 78.0–100.0)
MONO ABS: 0 10*3/uL — AB (ref 0.1–1.0)
Monocytes Relative: 0 %
Neutro Abs: 25.1 10*3/uL — ABNORMAL HIGH (ref 1.7–7.7)
Neutrophils Relative %: 95 %
PLATELETS: 118 10*3/uL — AB (ref 150–400)
RBC: 3.93 MIL/uL (ref 3.87–5.11)
RDW: 17.2 % — AB (ref 11.5–15.5)
SMEAR REVIEW: DECREASED
WBC: 26.3 10*3/uL — AB (ref 4.0–10.5)

## 2015-09-20 LAB — BASIC METABOLIC PANEL
Anion gap: 10 (ref 5–15)
BUN: 11 mg/dL (ref 6–20)
CO2: 26 mmol/L (ref 22–32)
CREATININE: 0.65 mg/dL (ref 0.44–1.00)
Calcium: 8.7 mg/dL — ABNORMAL LOW (ref 8.9–10.3)
Chloride: 99 mmol/L — ABNORMAL LOW (ref 101–111)
GFR calc Af Amer: >60 mL/min (ref >60)
GLUCOSE: 169 mg/dL — AB (ref 65–99)
POTASSIUM: 4.2 mmol/L (ref 3.5–5.1)
Sodium: 135 mmol/L (ref 135–145)

## 2015-09-20 LAB — I-STAT CG4 LACTIC ACID, ED: Lactic Acid, Venous: 1.99 mmol/L (ref 0.5–2.0)

## 2015-09-20 MED ORDER — IOHEXOL 300 MG/ML  SOLN
75.0000 mL | Freq: Once | INTRAMUSCULAR | Status: AC | PRN
  Administered 2015-09-20: 75 mL via INTRAVENOUS

## 2015-09-20 MED ORDER — OXYCODONE-ACETAMINOPHEN 5-325 MG PO TABS
ORAL_TABLET | ORAL | Status: AC
  Filled 2015-09-20: qty 1

## 2015-09-20 MED ORDER — OXYCODONE-ACETAMINOPHEN 5-325 MG PO TABS
1.0000 | ORAL_TABLET | ORAL | Status: AC
  Administered 2015-09-20: 1 via ORAL

## 2015-09-20 MED ORDER — PEGFILGRASTIM INJECTION 6 MG/0.6ML ~~LOC~~
6.0000 mg | PREFILLED_SYRINGE | Freq: Once | SUBCUTANEOUS | Status: AC
  Administered 2015-09-20: 6 mg via SUBCUTANEOUS
  Filled 2015-09-20: qty 0.6

## 2015-09-20 MED ORDER — SODIUM CHLORIDE 0.9 % IV BOLUS (SEPSIS)
1000.0000 mL | Freq: Once | INTRAVENOUS | Status: AC
  Administered 2015-09-20: 1000 mL via INTRAVENOUS

## 2015-09-20 MED ORDER — ACETAMINOPHEN 325 MG PO TABS
650.0000 mg | ORAL_TABLET | Freq: Once | ORAL | Status: AC
  Administered 2015-09-20: 650 mg via ORAL
  Filled 2015-09-20: qty 2

## 2015-09-20 MED ORDER — SODIUM CHLORIDE 0.9 % IV SOLN
Freq: Once | INTRAVENOUS | Status: AC
  Administered 2015-09-20: via INTRAVENOUS

## 2015-09-20 NOTE — Assessment & Plan Note (Addendum)
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  She is currently undergoing carboplatin/gemcitabine chemotherapy regimen; as well as Xgeva monthly injections.  She received cycle 1, day 8 of the gemcitabine only portion of her chemotherapy regimen today.  She received her last Xgeva injection on 08/17/2015.  Patient reports that her recurrent left breast cancer.  Has a notable enlarged area to the left outer breast that is extending to the left axilla region.  She states that she accidentally bumped into a grocery store freezer door earlier this week.  Directly onto the tumor site; in this area is now slightly red and tender.  Exam today reveals firm mass to the left upper outer region of her left breast; with some extension of this mass into the left axilla.  There is also some questionable discoloration/questionable bruising to this area; as well as some erythema.  There is no actual warmth to the site.  On exam.  There is no red streaks.  The area is nontender with palpation.  Dr. Lindi Adie had already seen the patient once earlier today; and treated prophylactically for any questionable cellulitis to the site.  Patient received vancomycin intravenously while at the cancer Center today.  Reviewed all findings with the on-call physician, Dr. Marin Olp; as well as Dr. Renold Genta will also prescribed Keflex antibiotics for any questionable infection over the weekend.  Patient was advised to call/return to go directly to the emergency department over the weekend for any worsening symptoms whatsoever.  Patient is scheduled to return on 09/20/2015 for a Neulasta injection and for recheck.

## 2015-09-20 NOTE — ED Notes (Signed)
Pt arrived by EMS from home. Complaint of abdominal pain & constipation for 1 week

## 2015-09-20 NOTE — Progress Notes (Signed)
Neulasta injection given by desk nurse after office visit

## 2015-09-20 NOTE — Assessment & Plan Note (Signed)
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  She is currently undergoing carboplatin/gemcitabine chemotherapy regimen; as well as Xgeva monthly injections.  She received cycle 1, day 8 of the gemcitabine only portion of her chemotherapy regimen today.  She received her last Xgeva injection on 08/17/2015.  Patient reports that her recurrent left breast cancer.  Has a notable enlarged area to the left outer breast that is extending to the left axilla region.  She states that she accidentally bumped into a grocery store freezer door earlier this week.  Directly onto the tumor site; in this area is now slightly red and tender.  Exam today reveals firm mass to the left upper outer region of her left breast; with some extension of this mass into the left axilla.  There is also some questionable discoloration/questionable bruising to this area; as well as some erythema.  There is no actual warmth to the site.  On exam.  There is no red streaks.  The area is nontender with palpation.  Dr. Lindi Adie had already seen the patient once earlier today; and treated prophylactically for any questionable cellulitis to the site.  Patient received vancomycin intravenously while at the cancer Center today.  Reviewed all findings with the on-call physician, Dr. Marin Olp; as well as Dr. Renold Genta will also prescribed Keflex antibiotics for any questionable infection over the weekend.  Patient was advised to call/return to go directly to the emergency department over the weekend for any worsening symptoms whatsoever.  Patient is scheduled to return on 09/20/2015 for a Neulasta injection and for recheck. ______________________________________  Update: The patient back in today for recheck.  Patient states that she slipped in the top over the weekend; and reached to grab herself.  She doesn't think that she injured her left breast, left axilla  area any further.  She denies hitting her head on a loss of consciousness.  She also denies any other injuries with the slipped in the tub.  However, patient does report continued increased edema and feeling of fullness to the left upper outer breast in the left axilla region.  She denies any fevers or chills over the weekend.  She has continued to take the Keflex antibiotics as directed.  Exam today on recheck reveals no change in the left upper outer breast region.  Reviewed all findings in detail with Dr. Lindi Adie today; and then obtained a CT with contrast of the chest to rule out an abscess.  CT of the chest revealed:  IMPRESSION: No interval change from recent CT. Specifically, no evidence of traumatic injury to the chest. No evidence of chest wall abscess.  Stable left breast/chest wall recurrence, as above.  Pulmonary metastatic disease has mildly improved from the recent CT.  Stable thoracic nodal metastases.  Advised patient that she could continue to take the Keflex antibiotics as previously prescribed; that the edema and erythema to the left upper outer breast site is most likely thought to be cancer-related.  There was no obvious abscess for known injury to the site.  Per the CT scan.  Patient was advised to call/return or go directly to the emergency department for any worsening symptoms whatsoever.

## 2015-09-20 NOTE — Progress Notes (Signed)
SYMPTOM MANAGEMENT CLINIC   HPI: Paula Navarro 65 y.o. female diagnosed with bilateral breast cancer.  Currently undergoing carboplatin/gemcitabine chemotherapy regimen; as well as monthly Xgeva.   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  She is currently undergoing carboplatin/gemcitabine chemotherapy regimen; as well as Xgeva monthly injections.  She received cycle 1, day 8 of the gemcitabine only portion of her chemotherapy regimen today.  She received her last Xgeva injection on 08/17/2015.  Patient reports that her recurrent left breast cancer.  Has a notable enlarged area to the left outer breast that is extending to the left axilla region.  She states that she accidentally bumped into a grocery store freezer door earlier this week.  Directly onto the tumor site; in this area is now slightly red and tender.  Exam today reveals firm mass to the left upper outer region of her left breast; with some extension of this mass into the left axilla.  There is also some questionable discoloration/questionable bruising to this area; as well as some erythema.  There is no actual warmth to the site.  On exam.  There is no red streaks.  The area is nontender with palpation.  Dr. Lindi Adie had already seen the patient once earlier today; and treated prophylactically for any questionable cellulitis to the site.  Patient received vancomycin intravenously while at the cancer Center today.  Reviewed all findings with the on-call physician, Dr. Marin Olp; as well as Dr. Renold Genta will also prescribed Keflex antibiotics for any questionable infection over the weekend.  Patient was advised to call/return to go directly to the emergency department over the weekend for any worsening symptoms whatsoever.  Patient is scheduled to return on 09/20/2015 for a Neulasta injection and for recheck.  HPI  ROS  Past Medical History   Diagnosis Date  . Chronic bronchitis   . Palpitation     Tachycardia reported by monitor clerk during a symptomatic spell  . Chest pain     Admitted to [facility] in 09/2011; refused stress test  . Atrial septal defect 1996    Surgical repair in 1996  . Tobacco abuse     60 pack years; 1.5 packs per day  . Anxiety   . Anemia   . Breast cancer ([place])   . Wears dentures     top  . COPD (chronic obstructive pulmonary disease) ([place])     on xray  . Chronic pain   . Radiation 06/30/14-08/17/14    Bilateral Breast    Past Surgical History  Procedure Laterality Date  . Cholecystectomy    . Cesarean section      x3  . Tubal ligation    . Asd repair, ostium primum  1996    dr Roxy Horseman  . Port a cath revision  1/15    put in   . Breast lumpectomy with radioactive seed localization Bilateral 04/08/2014    Procedure: BILATERAL  RADIOACTIVE SEED LOCALIZATION LUMPECTOMY ;  Surgeon: Autumn Messing III, MD;  Location: [facility];  Service: General;  Laterality: Bilateral;  . Axillary lymph node dissection Bilateral 04/08/2014    Procedure:  BILATERAL AXILLARY LYMPH NODE DISSECTION;  Surgeon: Autumn Messing III, MD;  Location: [facility];  Service: General;  Laterality: Bilateral;  . Open heart surgery    . Breast biopsy Bilateral     has Chronic bronchitis; Tobacco abuse; Laboratory test; Palpitation; Chest pain; Atrial  septal defect; Bilateral breast cancer ([place]); Neuropathy due to chemotherapeutic drug ([place]); Hand foot syndrome; Anxiety; Suspected herpes zoster left C5 distribution; Postherpetic neuralgia; Lymphedema; Rash; Vaginal bleeding; Chronic pain; Metastatic breast cancer ([place]); Encounter for chemotherapy management; Bone metastases ([place]); and Lung metastases ([place]) on her problem list.    is allergic to codeine and morphine and related.    Medication List       This list is accurate as of: 09/17/15 11:59 PM.  Always use your most recent med list.                albuterol 108 (90 Base) MCG/ACT inhaler  Commonly known as:  PROVENTIL HFA;VENTOLIN HFA  Inhale 1-2 puffs into the lungs every 6 (six) hours as needed for wheezing or shortness of breath.     calcium-vitamin D 500-400 MG-UNIT tablet  Commonly known as:  OSCAL-500  Take 1 tablet by mouth 2 (two) times daily.     cephALEXin 500 MG capsule  Commonly known as:  KEFLEX  Take 1 capsule (500 mg total) by mouth 4 (four) times daily.     gabapentin 300 MG capsule  Commonly known as:  NEURONTIN  TAKE 2 CAPSULES (600 MG TOTAL) BY MOUTH 3 (THREE) TIMES DAILY.     lidocaine-prilocaine cream  Commonly known as:  EMLA  Apply to affected area once     ondansetron 8 MG tablet  Commonly known as:  ZOFRAN  Take 1 tablet (8 mg total) by mouth 2 (two) times daily as needed for refractory nausea / vomiting. Start on day 3 after carboplatin chemo.     oxyCODONE 10 mg 12 hr tablet  Commonly known as:  OXYCONTIN  Take 1 tablet (10 mg total) by mouth every 12 (twelve) hours.     oxyCODONE-acetaminophen 5-325 MG tablet  Commonly known as:  PERCOCET/ROXICET  1 tabs PO q6h prn pain     prochlorperazine 10 MG tablet  Commonly known as:  COMPAZINE  Take 1 tablet (10 mg total) by mouth every 6 (six) hours as needed (Nausea or vomiting).     SUPER B COMPLEX PO  Take 1 tablet by mouth daily.     temazepam 30 MG capsule  Commonly known as:  RESTORIL  Take 1 capsule (30 mg total) by mouth at bedtime as needed for sleep.         PHYSICAL EXAMINATION  Oncology Vitals 09/20/2015 09/17/2015  Height 158 cm 158 cm  Weight 62.869 kg 62.415 kg  Weight (lbs) 138 lbs 10 oz 137 lbs 10 oz  BMI (kg/m2) 25.35 kg/m2 25.17 kg/m2  Temp 98.1 98.3  Pulse 90 86  Resp 18 18  SpO2 99 98  BSA (m2) 1.66 m2 1.65 m2   BP Readings from Last 2 Encounters:  09/20/15 131/80  09/17/15 140/80    Physical Exam  Constitutional: She is oriented to person, place, and time and well-developed, well-nourished, and in no  distress.  HENT:  Head: Normocephalic and atraumatic.  Mouth/Throat: Oropharynx is clear and moist.  Eyes: Conjunctivae and EOM are normal. Pupils are equal, round, and reactive to light. Right eye exhibits no discharge. Left eye exhibits no discharge. No scleral icterus.  Neck: Normal range of motion. Neck supple. No JVD present. No tracheal deviation present. No thyromegaly present.  Cardiovascular: Normal rate, regular rhythm, normal heart sounds and intact distal pulses.   Pulmonary/Chest: Effort normal and breath sounds normal. No respiratory distress. She has no wheezes. She has no rales. She exhibits  no tenderness.  Abdominal: Soft. Bowel sounds are normal. She exhibits no distension and no mass. There is no tenderness. There is no rebound and no guarding.  Musculoskeletal: Normal range of motion. She exhibits no edema or tenderness.  Lymphadenopathy:    She has no cervical adenopathy.  Neurological: She is alert and oriented to person, place, and time. Gait normal.  Skin: Skin is warm and dry. No rash noted. There is erythema. No pallor.  Exam today reveals firm mass to the left upper outer region of her left breast; with some extension of this mass into the left axilla.  There is also some questionable discoloration/questionable bruising to this area; as well as some erythema.  There is no actual warmth to the site.  On exam.  There is no red streaks.  The area is nontender with palpation.        Psychiatric: Affect normal.  Nursing note and vitals reviewed.   LABORATORY DATA:. Appointment on 09/17/2015  Component Date Value Ref Range Status  . WBC 09/17/2015 5.6  3.9 - 10.3 10e3/uL Final  . NEUT# 09/17/2015 2.9  1.5 - 6.5 10e3/uL Final  . HGB 09/17/2015 10.0* 11.6 - 15.9 g/dL Final  . HCT 09/17/2015 30.7* 34.8 - 46.6 % Final  . Platelets 09/17/2015 152  145 - 400 10e3/uL Final  . MCV 09/17/2015 75.8* 79.5 - 101.0 fL Final  . MCH 09/17/2015 24.7* 25.1 - 34.0 pg Final  .  MCHC 09/17/2015 32.6  31.5 - 36.0 g/dL Final  . RBC 09/17/2015 4.05  3.70 - 5.45 10e6/uL Final  . RDW 09/17/2015 17.8* 11.2 - 14.5 % Final  . lymph# 09/17/2015 2.1  0.9 - 3.3 10e3/uL Final  . MONO# 09/17/2015 0.5  0.1 - 0.9 10e3/uL Final  . Eosinophils Absolute 09/17/2015 0.1  0.0 - 0.5 10e3/uL Final  . Basophils Absolute 09/17/2015 0.0  0.0 - 0.1 10e3/uL Final  . NEUT% 09/17/2015 52.2  38.4 - 76.8 % Final  . LYMPH% 09/17/2015 36.7  14.0 - 49.7 % Final  . MONO% 09/17/2015 9.6  0.0 - 14.0 % Final  . EOS% 09/17/2015 1.1  0.0 - 7.0 % Final  . BASO% 09/17/2015 0.4  0.0 - 2.0 % Final  . Sodium 09/17/2015 137  136 - 145 mEq/L Final  . Potassium 09/17/2015 4.3  3.5 - 5.1 mEq/L Final  . Chloride 09/17/2015 100  98 - 109 mEq/L Final  . CO2 09/17/2015 27  22 - 29 mEq/L Final  . Glucose 09/17/2015 140  70 - 140 mg/dl Final   Glucose reference range is for nonfasting patients. Fasting glucose reference range is 70- 100.  Marland Kitchen BUN 09/17/2015 10.5  7.0 - 26.0 mg/dL Final  . Creatinine 09/17/2015 0.8  0.6 - 1.1 mg/dL Final  . Total Bilirubin 09/17/2015 0.87  0.20 - 1.20 mg/dL Final  . Alkaline Phosphatase 09/17/2015 73  40 - 150 U/L Final  . AST 09/17/2015 48* 5 - 34 U/L Final  . ALT 09/17/2015 60* 0 - 55 U/L Final  . Total Protein 09/17/2015 7.1  6.4 - 8.3 g/dL Final  . Albumin 09/17/2015 3.4* 3.5 - 5.0 g/dL Final  . Calcium 09/17/2015 9.7  8.4 - 10.4 mg/dL Final  . Anion Gap 09/17/2015 10  3 - 11 mEq/L Final  . EGFR 09/17/2015 87* >90 ml/min/1.73 m2 Final   eGFR is calculated using the CKD-EPI Creatinine Equation (2009)       RADIOGRAPHIC STUDIES: Ct Chest W Contrast  09/20/2015  CLINICAL  DATA:  Trauma to left breast, fall. Left breast pain, redness/ swelling, erythema. Concern for abscess versus progressive cancer. History of bilateral breast cancer status post lumpectomy, oral chemotherapy and XRT complete, now on IV chemotherapy. Metastatic disease. EXAM: CT CHEST WITH CONTRAST TECHNIQUE:  Multidetector CT imaging of the chest was performed during intravenous contrast administration. CONTRAST:  69m OMNIPAQUE IOHEXOL 300 MG/ML  SOLN COMPARISON:  CT chest dated 09/01/2015. FINDINGS: No interval change from recent CT. Specifically, no evidence of traumatic injury to the chest. Mediastinum/Nodes: Heart is normal in size. No pericardial effusion. Atherosclerotic calcifications of the aortic arch. Right chest port terminates in the upper right atrium. Thoracic lymphadenopathy, including: --necrotic left chest wall/ subpectoral lymph nodes, measuring 14 mm (series 2/ image 9) and 17 mm short axis (series 2/ image 10), previously 13 and 20 mm respectively --mild mediastinal lymphadenopathy, including an 11 mm low right paratracheal node (series 2/image 19), previously 10 mm Status post bilateral axillary lymph node dissection. Lungs/Pleura: Bilateral pulmonary metastases, predominantly on the right, including: --10 mm nodule in the posterior right upper lobe (series 6/ image 10), previously 13 mm --17 x 11 mm subpleural nodule in the right middle lobe (series 6/ image 25), previously 21 x 12 mm --13 x 15 mm nodule in the right lower lobe (series 6/ image 32), previously 17 x 19 mm No focal consolidation. Mild centrilobular and paraseptal emphysematous changes. No pleural effusion or pneumothorax. Upper abdomen: Visualized upper abdomen is unchanged, noting mild pancreatic and extrahepatic ductal dilatation. Musculoskeletal: 2.4 x 2.5 cm enhancing mass in the upper central left breast (series 2/image 16), unchanged. 6.6 x 5.0 cm enhancing mass in the pectoralis muscle deep to the left breast (series 2/image 15), previously 6.2 x 5.8 cm when measured in a similar fashion. Additional surgical clips in the lower/central left breast (series 2/images 26-27). Overlying skin thickening. Postoperative seroma in the upper/outer right breast/axilla (series 2/images 21). Adjacent surgical clips. Overlying skin  thickening. Degenerative changes of the thoracic spine. Old left posterolateral 6th and 7th rib fracture deformities. Median sternotomy. IMPRESSION: No interval change from recent CT. Specifically, no evidence of traumatic injury to the chest. No evidence of chest wall abscess. Stable left breast/chest wall recurrence, as above. Pulmonary metastatic disease has mildly improved from the recent CT. Stable thoracic nodal metastases. Electronically Signed   By: SJulian HyM.D.   On: 09/20/2015 11:26    ASSESSMENT/PLAN:    Lymphedema Patient has a history of chronic left arm lymphedema; and has been followed in the past per the lymphedema clinic.  She has a compression sleeve that she occasionally wears as well.  Exam today does reveal chronic lymphedema to the entire left upper extremity.  Chronic pain Patient has a history of chronic pain as well.  She takes OxyContin 10 mg twice daily and uses oxycodone for breakthrough pain.  She also takes gabapentin as prescribed.  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  She is currently undergoing carboplatin/gemcitabine chemotherapy regimen; as well as Xgeva monthly injections.  She received cycle 1, day 8 of the gemcitabine only portion of her chemotherapy regimen today.  She received her last Xgeva injection on 08/17/2015.  Patient reports that her recurrent left breast cancer.  Has a notable enlarged area to the left outer breast that is extending to the left axilla region.  She states that she accidentally bumped into a grocery  store freezer door earlier this week.  Directly onto the tumor site; in this area is now slightly red and tender.  Exam today reveals firm mass to the left upper outer region of her left breast; with some extension of this mass into the left axilla.  There is also some questionable discoloration/questionable  bruising to this area; as well as some erythema.  There is no actual warmth to the site.  On exam.  There is no red streaks.  The area is nontender with palpation.  Dr. Lindi Adie had already seen the patient once earlier today; and treated prophylactically for any questionable cellulitis to the site.  Patient received vancomycin intravenously while at the cancer Center today.  Reviewed all findings with the on-call physician, Dr. Marin Olp; as well as Dr. Renold Genta will also prescribed Keflex antibiotics for any questionable infection over the weekend.  Patient was advised to call/return to go directly to the emergency department over the weekend for any worsening symptoms whatsoever.  Patient is scheduled to return on 09/20/2015 for a Neulasta injection and for recheck.  Patient stated understanding of all instructions; and was in agreement with this plan of care. The patient knows to call the clinic with any problems, questions or concerns.   Review/collaboration with Dr. Lindi Adie regarding all aspects of patient's visit today.   Total time spent with patient was 25 minutes;  with greater than 75 percent of that time spent in face to face counseling regarding patient's symptoms,  and coordination of care and follow up.  Disclaimer:This dictation was prepared with Dragon/digital dictation along with [employer]. Any transcriptional errors that result from this process are unintentional.  Drue Second, NP 09/20/2015

## 2015-09-20 NOTE — Progress Notes (Signed)
Sent [employer] oxycontin prior auth

## 2015-09-20 NOTE — Assessment & Plan Note (Signed)
Patient has a history of chronic left arm lymphedema; and has been followed in the past per the lymphedema clinic.  She has a compression sleeve that she occasionally wears as well.  Exam today does reveal chronic lymphedema to the entire left upper extremity.

## 2015-09-20 NOTE — Progress Notes (Signed)
SYMPTOM MANAGEMENT CLINIC   HPI: Paula Navarro 65 y.o. female diagnosed with bilateral breast cancer.  Currently undergoing carboplatin/gemcitabine chemotherapy regimen; as well as monthly Xgeva.   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  She is currently undergoing carboplatin/gemcitabine chemotherapy regimen; as well as Xgeva monthly injections.  She received cycle 1, day 8 of the gemcitabine only portion of her chemotherapy regimen today.  She received her last Xgeva injection on 08/17/2015.  Patient reports that her recurrent left breast cancer.  Has a notable enlarged area to the left outer breast that is extending to the left axilla region.  She states that she accidentally bumped into a grocery store freezer door earlier this week.  Directly onto the tumor site; in this area is now slightly red and tender.  Exam today reveals firm mass to the left upper outer region of her left breast; with some extension of this mass into the left axilla.  There is also some questionable discoloration/questionable bruising to this area; as well as some erythema.  There is no actual warmth to the site.  On exam.  There is no red streaks.  The area is nontender with palpation.  Dr. Lindi Adie had already seen the patient once earlier today; and treated prophylactically for any questionable cellulitis to the site.  Patient received vancomycin intravenously while at the cancer Center today.  Reviewed all findings with the on-call physician, Dr. Marin Olp; as well as Dr. Renold Genta will also prescribed Keflex antibiotics for any questionable infection over the weekend.  Patient was advised to call/return to go directly to the emergency department over the weekend for any worsening symptoms whatsoever.  Patient is scheduled to return on 09/20/2015 for a Neulasta injection and for recheck.  HPI  ROS  Past Medical History   Diagnosis Date  . Chronic bronchitis   . Palpitation     Tachycardia reported by monitor clerk during a symptomatic spell  . Chest pain     Admitted to [facility] in 09/2011; refused stress test  . Atrial septal defect 1996    Surgical repair in 1996  . Tobacco abuse     60 pack years; 1.5 packs per day  . Anxiety   . Anemia   . Breast cancer ([place])   . Wears dentures     top  . COPD (chronic obstructive pulmonary disease) ([place])     on xray  . Chronic pain   . Radiation 06/30/14-08/17/14    Bilateral Breast    Past Surgical History  Procedure Laterality Date  . Cholecystectomy    . Cesarean section      x3  . Tubal ligation    . Asd repair, ostium primum  1996    dr Roxy Horseman  . Port a cath revision  1/15    put in   . Breast lumpectomy with radioactive seed localization Bilateral 04/08/2014    Procedure: BILATERAL  RADIOACTIVE SEED LOCALIZATION LUMPECTOMY ;  Surgeon: Autumn Messing III, MD;  Location: [facility];  Service: General;  Laterality: Bilateral;  . Axillary lymph node dissection Bilateral 04/08/2014    Procedure:  BILATERAL AXILLARY LYMPH NODE DISSECTION;  Surgeon: Autumn Messing III, MD;  Location: [facility];  Service: General;  Laterality: Bilateral;  . Open heart surgery    . Breast biopsy Bilateral     has Chronic bronchitis; Tobacco abuse; Laboratory test; Palpitation; Chest pain; Atrial  septal defect; Bilateral breast cancer ([place]); Neuropathy due to chemotherapeutic drug ([place]); Hand foot syndrome; Anxiety; Suspected herpes zoster left C5 distribution; Postherpetic neuralgia; Lymphedema; Rash; Vaginal bleeding; Chronic pain; Metastatic breast cancer ([place]); Encounter for chemotherapy management; Bone metastases ([place]); and Lung metastases ([place]) on her problem list.    is allergic to codeine and morphine and related.    Medication List       This list is accurate as of: 09/20/15  2:44 PM.  Always use your most recent med list.                albuterol 108 (90 Base) MCG/ACT inhaler  Commonly known as:  PROVENTIL HFA;VENTOLIN HFA  Inhale 1-2 puffs into the lungs every 6 (six) hours as needed for wheezing or shortness of breath.     calcium-vitamin D 500-400 MG-UNIT tablet  Commonly known as:  OSCAL-500  Take 1 tablet by mouth 2 (two) times daily.     cephALEXin 500 MG capsule  Commonly known as:  KEFLEX  Take 1 capsule (500 mg total) by mouth 4 (four) times daily.     gabapentin 300 MG capsule  Commonly known as:  NEURONTIN  TAKE 2 CAPSULES (600 MG TOTAL) BY MOUTH 3 (THREE) TIMES DAILY.     lidocaine-prilocaine cream  Commonly known as:  EMLA  Apply to affected area once     ondansetron 8 MG tablet  Commonly known as:  ZOFRAN  Take 1 tablet (8 mg total) by mouth 2 (two) times daily as needed for refractory nausea / vomiting. Start on day 3 after carboplatin chemo.     oxyCODONE 10 mg 12 hr tablet  Commonly known as:  OXYCONTIN  Take 1 tablet (10 mg total) by mouth every 12 (twelve) hours.     oxyCODONE-acetaminophen 5-325 MG tablet  Commonly known as:  PERCOCET/ROXICET  1 tabs PO q6h prn pain     prochlorperazine 10 MG tablet  Commonly known as:  COMPAZINE  Take 1 tablet (10 mg total) by mouth every 6 (six) hours as needed (Nausea or vomiting).     SUPER B COMPLEX PO  Take 1 tablet by mouth daily.     temazepam 30 MG capsule  Commonly known as:  RESTORIL  Take 1 capsule (30 mg total) by mouth at bedtime as needed for sleep.         PHYSICAL EXAMINATION  Oncology Vitals 09/20/2015 09/17/2015  Height 158 cm 158 cm  Weight 62.869 kg 62.415 kg  Weight (lbs) 138 lbs 10 oz 137 lbs 10 oz  BMI (kg/m2) 25.35 kg/m2 25.17 kg/m2  Temp 98.1 98.3  Pulse 90 86  Resp 18 18  SpO2 99 98  BSA (m2) 1.66 m2 1.65 m2   BP Readings from Last 2 Encounters:  09/20/15 131/80  09/17/15 140/80    Physical Exam  Constitutional: She is oriented to person, place, and time and well-developed, well-nourished, and in no  distress.  HENT:  Head: Normocephalic and atraumatic.  Mouth/Throat: Oropharynx is clear and moist.  Eyes: Conjunctivae and EOM are normal. Pupils are equal, round, and reactive to light. Right eye exhibits no discharge. Left eye exhibits no discharge. No scleral icterus.  Neck: Normal range of motion. Neck supple. No JVD present. No tracheal deviation present. No thyromegaly present.  Cardiovascular: Normal rate, regular rhythm, normal heart sounds and intact distal pulses.   Pulmonary/Chest: Effort normal and breath sounds normal. No respiratory distress. She has no wheezes. She has no rales. She  exhibits no tenderness.  Abdominal: Soft. Bowel sounds are normal. She exhibits no distension and no mass. There is no tenderness. There is no rebound and no guarding.  Musculoskeletal: Normal range of motion. She exhibits no edema or tenderness.  Lymphadenopathy:    She has no cervical adenopathy.  Neurological: She is alert and oriented to person, place, and time. Gait normal.  Skin: Skin is warm and dry. No rash noted. There is erythema. No pallor.  Exam today reveals firm mass to the left upper outer region of her left breast; with some extension of this mass into the left axilla.  There is also some questionable discoloration/questionable bruising to this area; as well as some erythema.  There is no actual warmth to the site.  On exam.  There is no red streaks.  The area is nontender with palpation.        Psychiatric: Affect normal.  Nursing note and vitals reviewed.   LABORATORY DATA:. No visits with results within 3 Day(s) from this visit. Latest known visit with results is:  Appointment on 09/17/2015  Component Date Value Ref Range Status  . WBC 09/17/2015 5.6  3.9 - 10.3 10e3/uL Final  . NEUT# 09/17/2015 2.9  1.5 - 6.5 10e3/uL Final  . HGB 09/17/2015 10.0* 11.6 - 15.9 g/dL Final  . HCT 09/17/2015 30.7* 34.8 - 46.6 % Final  . Platelets 09/17/2015 152  145 - 400 10e3/uL Final  .  MCV 09/17/2015 75.8* 79.5 - 101.0 fL Final  . MCH 09/17/2015 24.7* 25.1 - 34.0 pg Final  . MCHC 09/17/2015 32.6  31.5 - 36.0 g/dL Final  . RBC 09/17/2015 4.05  3.70 - 5.45 10e6/uL Final  . RDW 09/17/2015 17.8* 11.2 - 14.5 % Final  . lymph# 09/17/2015 2.1  0.9 - 3.3 10e3/uL Final  . MONO# 09/17/2015 0.5  0.1 - 0.9 10e3/uL Final  . Eosinophils Absolute 09/17/2015 0.1  0.0 - 0.5 10e3/uL Final  . Basophils Absolute 09/17/2015 0.0  0.0 - 0.1 10e3/uL Final  . NEUT% 09/17/2015 52.2  38.4 - 76.8 % Final  . LYMPH% 09/17/2015 36.7  14.0 - 49.7 % Final  . MONO% 09/17/2015 9.6  0.0 - 14.0 % Final  . EOS% 09/17/2015 1.1  0.0 - 7.0 % Final  . BASO% 09/17/2015 0.4  0.0 - 2.0 % Final  . Sodium 09/17/2015 137  136 - 145 mEq/L Final  . Potassium 09/17/2015 4.3  3.5 - 5.1 mEq/L Final  . Chloride 09/17/2015 100  98 - 109 mEq/L Final  . CO2 09/17/2015 27  22 - 29 mEq/L Final  . Glucose 09/17/2015 140  70 - 140 mg/dl Final   Glucose reference range is for nonfasting patients. Fasting glucose reference range is 70- 100.  Marland Kitchen BUN 09/17/2015 10.5  7.0 - 26.0 mg/dL Final  . Creatinine 09/17/2015 0.8  0.6 - 1.1 mg/dL Final  . Total Bilirubin 09/17/2015 0.87  0.20 - 1.20 mg/dL Final  . Alkaline Phosphatase 09/17/2015 73  40 - 150 U/L Final  . AST 09/17/2015 48* 5 - 34 U/L Final  . ALT 09/17/2015 60* 0 - 55 U/L Final  . Total Protein 09/17/2015 7.1  6.4 - 8.3 g/dL Final  . Albumin 09/17/2015 3.4* 3.5 - 5.0 g/dL Final  . Calcium 09/17/2015 9.7  8.4 - 10.4 mg/dL Final  . Anion Gap 09/17/2015 10  3 - 11 mEq/L Final  . EGFR 09/17/2015 87* >90 ml/min/1.73 m2 Final   eGFR is calculated using the CKD-EPI Creatinine  Equation (2009)    RADIOGRAPHIC STUDIES: Ct Chest W Contrast  09/20/2015  CLINICAL DATA:  Trauma to left breast, fall. Left breast pain, redness/ swelling, erythema. Concern for abscess versus progressive cancer. History of bilateral breast cancer status post lumpectomy, oral chemotherapy and XRT complete,  now on IV chemotherapy. Metastatic disease. EXAM: CT CHEST WITH CONTRAST TECHNIQUE: Multidetector CT imaging of the chest was performed during intravenous contrast administration. CONTRAST:  33m OMNIPAQUE IOHEXOL 300 MG/ML  SOLN COMPARISON:  CT chest dated 09/01/2015. FINDINGS: No interval change from recent CT. Specifically, no evidence of traumatic injury to the chest. Mediastinum/Nodes: Heart is normal in size. No pericardial effusion. Atherosclerotic calcifications of the aortic arch. Right chest port terminates in the upper right atrium. Thoracic lymphadenopathy, including: --necrotic left chest wall/ subpectoral lymph nodes, measuring 14 mm (series 2/ image 9) and 17 mm short axis (series 2/ image 10), previously 13 and 20 mm respectively --mild mediastinal lymphadenopathy, including an 11 mm low right paratracheal node (series 2/image 19), previously 10 mm Status post bilateral axillary lymph node dissection. Lungs/Pleura: Bilateral pulmonary metastases, predominantly on the right, including: --10 mm nodule in the posterior right upper lobe (series 6/ image 10), previously 13 mm --17 x 11 mm subpleural nodule in the right middle lobe (series 6/ image 25), previously 21 x 12 mm --13 x 15 mm nodule in the right lower lobe (series 6/ image 32), previously 17 x 19 mm No focal consolidation. Mild centrilobular and paraseptal emphysematous changes. No pleural effusion or pneumothorax. Upper abdomen: Visualized upper abdomen is unchanged, noting mild pancreatic and extrahepatic ductal dilatation. Musculoskeletal: 2.4 x 2.5 cm enhancing mass in the upper central left breast (series 2/image 16), unchanged. 6.6 x 5.0 cm enhancing mass in the pectoralis muscle deep to the left breast (series 2/image 15), previously 6.2 x 5.8 cm when measured in a similar fashion. Additional surgical clips in the lower/central left breast (series 2/images 26-27). Overlying skin thickening. Postoperative seroma in the upper/outer right  breast/axilla (series 2/images 21). Adjacent surgical clips. Overlying skin thickening. Degenerative changes of the thoracic spine. Old left posterolateral 6th and 7th rib fracture deformities. Median sternotomy. IMPRESSION: No interval change from recent CT. Specifically, no evidence of traumatic injury to the chest. No evidence of chest wall abscess. Stable left breast/chest wall recurrence, as above. Pulmonary metastatic disease has mildly improved from the recent CT. Stable thoracic nodal metastases. Electronically Signed   By: SJulian HyM.D.   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  She is currently undergoing carboplatin/gemcitabine chemotherapy regimen; as well as Xgeva monthly injections.  She received cycle 1, day 8 of the gemcitabine only portion of her chemotherapy regimen today.  She received her last Xgeva injection on 08/17/2015.  Patient reports that her recurrent left breast cancer.  Has a notable enlarged area to the left outer breast that is extending to the left axilla region.  She states that she accidentally bumped into a grocery store freezer door earlier this week.  Directly onto the tumor site; in this area is now slightly red and tender.  Exam today reveals firm mass to the left upper outer region of her left breast; with some extension of this mass into the left axilla.  There is also some questionable discoloration/questionable bruising to this area; as well as some erythema.  There  is no actual warmth to the site.  On exam.  There is no red streaks.  The area is nontender with palpation.  Dr. Lindi Adie had already seen the patient once earlier today; and treated prophylactically for any questionable cellulitis to the site.  Patient received vancomycin intravenously while at the cancer Center  today.  Reviewed all findings with the on-call physician, Dr. Marin Olp; as well as Dr. Renold Genta will also prescribed Keflex antibiotics for any questionable infection over the weekend.  Patient was advised to call/return to go directly to the emergency department over the weekend for any worsening symptoms whatsoever.  Patient is scheduled to return on 09/20/2015 for a Neulasta injection and for recheck. ______________________________________  Update: The patient back in today for recheck.  Patient states that she slipped in the top over the weekend; and reached to grab herself.  She doesn't think that she injured her left breast, left axilla area any further.  She denies hitting her head on a loss of consciousness.  She also denies any other injuries with the slipped in the tub.  However, patient does report continued increased edema and feeling of fullness to the left upper outer breast in the left axilla region.  She denies any fevers or chills over the weekend.  She has continued to take the Keflex antibiotics as directed.  Exam today on recheck reveals no change in the left upper outer breast region.  Reviewed all findings in detail with Dr. Lindi Adie today; and then obtained a CT with contrast of the chest to rule out an abscess.  CT of the chest revealed:  IMPRESSION: No interval change from recent CT. Specifically, no evidence of traumatic injury to the chest. No evidence of chest wall abscess.  Stable left breast/chest wall recurrence, as above.  Pulmonary metastatic disease has mildly improved from the recent CT.  Stable thoracic nodal metastases.  Advised patient that she could continue to take the Keflex antibiotics as previously prescribed; that the edema and erythema to the left upper outer breast site is most likely thought to be cancer-related.  There was no obvious abscess for known injury to the site.  Per the CT scan.  Patient was advised to call/return or go directly  to the emergency department for any worsening symptoms whatsoever.     Patient stated understanding of all instructions; and was in agreement with this plan of care. The patient knows to call the clinic with any problems, questions or concerns.   Review/collaboration with Dr. Lindi Adie regarding all aspects of patient's visit today.   Total time spent with patient was 40 minutes;  with greater than 75 percent of that time spent in face to face counseling regarding patient's symptoms,  and coordination of care and follow up.  Disclaimer:This dictation was prepared with Dragon/digital dictation along with [employer]. Any transcriptional errors that result from this process are unintentional.  Drue Second, NP 09/20/2015

## 2015-09-20 NOTE — Assessment & Plan Note (Signed)
Patient has a history of chronic pain as well.  She takes OxyContin 10 mg twice daily and uses oxycodone for breakthrough pain.  She also takes gabapentin as prescribed.

## 2015-09-20 NOTE — ED Provider Notes (Signed)
CSN: [id number]     Arrival date & time 09/20/15  2132 History  By signing my name below, I, Doran Stabler, attest that this documentation has been prepared under the direction and in the presence of Tanna Furry, MD. Electronically Signed: Doran Stabler, ED Scribe. 09/21/2015. 9:45 PM.   Chief Complaint  Patient presents with  . Constipation   The history is provided by the patient. No language interpreter was used.   HPI Comments: Paula Navarro is a 65 y.o. female with a PMHx of COPD, anemia, anxiety, atrial septal defect, and palpitation who presents to the Emergency Department complaining of constipation, that was noted today. Pt also reports mild lower abdominal pain. Pt reports her last BM was 1 week ago. Pt used a gloved hand to pull out stool that was both "hard and soft". Pt denies any nausea, vomiting, or any other symptoms at this time. Pt is prescribed 1 oxycodone table every 6 hours however she only takes half a tablet at a time. Pt is on chemotherapy, once a week at [facility]  Past Medical History  Diagnosis Date  . Chronic bronchitis   . Palpitation     Tachycardia reported by monitor clerk during a symptomatic spell  . Chest pain     Admitted to [facility] in 09/2011; refused stress test  . Atrial septal defect 1996    Surgical repair in 1996  . Tobacco abuse     60 pack years; 1.5 packs per day  . Anxiety   . Anemia   . Breast cancer ([place])   . Wears dentures     top  . COPD (chronic obstructive pulmonary disease) ([place])     on xray  . Chronic pain   . Radiation 06/30/14-08/17/14    Bilateral Breast   Past Surgical History  Procedure Laterality Date  . Cholecystectomy    . Cesarean section      x3  . Tubal ligation    . Asd repair, ostium primum  1996    dr Roxy Horseman  . Port a cath revision  1/15    put in   . Breast lumpectomy with radioactive seed localization Bilateral 04/08/2014    Procedure: BILATERAL  RADIOACTIVE SEED LOCALIZATION LUMPECTOMY ;  Surgeon: Autumn Messing III, MD;  Location: [place];  Service: General;  Laterality: Bilateral;  . Axillary lymph node dissection Bilateral 04/08/2014    Procedure:  BILATERAL AXILLARY LYMPH NODE DISSECTION;  Surgeon: Autumn Messing III, MD;  Location: [place];  Service: General;  Laterality: Bilateral;  . Open heart surgery    . Breast biopsy Bilateral    Family History  Problem Relation Age of Onset  . Breast cancer Maternal Aunt    Social History  Substance Use Topics  . Smoking status: Former Smoker -- 1.50 packs/day for 40 years    Types: Cigarettes    Quit date: 08/01/2012  . Smokeless tobacco: Never Used  . Alcohol Use: No   OB History    Gravida Para Term Preterm AB TAB SAB Ectopic Multiple Living   '4 3 3            '$ Review of Systems  Constitutional: Negative for fever, chills, diaphoresis, appetite change and fatigue.  HENT: Negative for mouth sores, sore throat and trouble swallowing.   Eyes: Negative for visual disturbance.  Respiratory: Negative for cough, chest tightness, shortness of breath and wheezing.   Cardiovascular: Negative for chest pain.  Gastrointestinal: Positive for  abdominal pain and constipation. Negative for nausea, vomiting, diarrhea and abdominal distention.  Endocrine: Negative for polydipsia, polyphagia and polyuria.  Genitourinary: Negative for dysuria, frequency and hematuria.  Musculoskeletal: Negative for gait problem.  Skin: Negative for color change, pallor and rash.  Neurological: Negative for dizziness, syncope, light-headedness and headaches.  Hematological: Does not bruise/bleed easily.  Psychiatric/Behavioral: Negative for behavioral problems and confusion.  All other systems reviewed and are negative.   Allergies  Codeine and Morphine and related  Home Medications   Prior to Admission medications   Medication Sig Start Date End Date Taking? Authorizing Provider  albuterol (PROVENTIL HFA;VENTOLIN HFA) 108 (90 Base)  MCG/ACT inhaler Inhale 1-2 puffs into the lungs every 6 (six) hours as needed for wheezing or shortness of breath. 09/06/15  Yes Milton Ferguson, MD  B Complex-C (SUPER B COMPLEX PO) Take 1 tablet by mouth daily.   Yes Historical Provider, MD  calcium-vitamin D (OSCAL-500) 500-400 MG-UNIT tablet Take 1 tablet by mouth 2 (two) times daily. 08/18/15  Yes Nicholas Lose, MD  cephALEXin (KEFLEX) 500 MG capsule Take 1 capsule (500 mg total) by mouth 4 (four) times daily. 09/17/15  Yes Susanne Borders, NP  gabapentin (NEURONTIN) 300 MG capsule TAKE 2 CAPSULES (600 MG TOTAL) BY MOUTH 3 (THREE) TIMES DAILY. 06/14/15  Yes Nicholas Lose, MD  lidocaine-prilocaine (EMLA) cream Apply to affected area once Patient taking differently: Apply 1 application topically as needed. Apply to affected area once 09/03/15  Yes Nicholas Lose, MD  LORazepam (ATIVAN) 1 MG tablet Take 1 mg by mouth at bedtime as needed for anxiety or sleep.  09/12/15  Yes Historical Provider, MD  ondansetron (ZOFRAN) 8 MG tablet Take 1 tablet (8 mg total) by mouth 2 (two) times daily as needed for refractory nausea / vomiting. Start on day 3 after carboplatin chemo. 09/03/15  Yes Nicholas Lose, MD  oxyCODONE (OXYCONTIN) 10 mg 12 hr tablet Take 1 tablet (10 mg total) by mouth every 12 (twelve) hours. 09/17/15  Yes Nicholas Lose, MD  oxyCODONE-acetaminophen (PERCOCET/ROXICET) 5-325 MG tablet 1 tabs PO q6h prn pain Patient taking differently: Take 0.5-1 tablets by mouth every 6 (six) hours as needed for moderate pain. 1 tabs PO q6h prn pain 08/27/15  Yes Nicholas Lose, MD  prochlorperazine (COMPAZINE) 10 MG tablet Take 1 tablet (10 mg total) by mouth every 6 (six) hours as needed (Nausea or vomiting). 09/03/15  Yes Nicholas Lose, MD  temazepam (RESTORIL) 30 MG capsule Take 1 capsule (30 mg total) by mouth at bedtime as needed for sleep. 09/17/15  Yes Nicholas Lose, MD  amoxicillin (AMOXIL) 500 MG capsule Take 500 mg by mouth 2 (two) times daily. Reported on 09/20/2015 09/10/15    Historical Provider, MD   BP 103/65 mmHg  Pulse 109  Temp(Src) 100.6 F (38.1 C) (Oral)  Resp 22  Ht '5\' 2"'$  (1.575 m)  Wt 137 lb (62.143 kg)  BMI 25.05 kg/m2  SpO2 93%  LMP 05/20/2011    Physical Exam  Constitutional: She is oriented to person, place, and time. She appears well-developed and well-nourished. No distress.  HENT:  Head: Normocephalic.  Eyes: Conjunctivae are normal. Pupils are equal, round, and reactive to light. No scleral icterus.  Neck: Normal range of motion. Neck supple. No thyromegaly present.  Cardiovascular: Normal rate and regular rhythm.  Exam reveals no gallop and no friction rub.   No murmur heard. Pulmonary/Chest: Effort normal and breath sounds normal. No respiratory distress. She has no wheezes. She has no  rales.  Large soft tissue prominence on the upper left chest wall; port right lateral chest - site is normal  Abdominal: Soft. Bowel sounds are normal. She exhibits no distension. There is no tenderness. There is no rebound.  Musculoskeletal: Normal range of motion.  Neurological: She is alert and oriented to person, place, and time.  Skin: Skin is warm and dry. No rash noted.  Psychiatric: She has a normal mood and affect. Her behavior is normal.  Nursing note and vitals reviewed.   ED Course  Procedures  DIAGNOSTIC STUDIES: Oxygen Saturation is 99% on room air, normal by my interpretation.    COORDINATION OF CARE: 9:46 PM Will give fluids and Tylenol. Will order CXR,, blood work and urinalysis.  Discussed treatment plan with pt at bedside and pt agreed to plan.  Labs Review Labs Reviewed  CBC WITH DIFFERENTIAL/PLATELET - Abnormal; Notable for the following:    WBC 26.3 (*)    Hemoglobin 9.7 (*)    HCT 30.1 (*)    MCV 76.6 (*)    MCH 24.7 (*)    RDW 17.2 (*)    Platelets 118 (*)    Neutro Abs 25.1 (*)    Monocytes Absolute 0.0 (*)    All other components within normal limits  BASIC METABOLIC PANEL - Abnormal; Notable for the  following:    Chloride 99 (*)    Glucose, Bld 169 (*)    Calcium 8.7 (*)    All other components within normal limits  CULTURE, BLOOD (ROUTINE X 2)  CULTURE, BLOOD (ROUTINE X 2)  URINE CULTURE  URINALYSIS, ROUTINE W REFLEX MICROSCOPIC (NOT AT [facility])  I-STAT CG4 LACTIC ACID, ED    Imaging Review Ct Chest W Contrast  09/20/2015  CLINICAL DATA:  Trauma to left breast, fall. Left breast pain, redness/ swelling, erythema. Concern for abscess versus progressive cancer. History of bilateral breast cancer status post lumpectomy, oral chemotherapy and XRT complete, now on IV chemotherapy. Metastatic disease. EXAM: CT CHEST WITH CONTRAST TECHNIQUE: Multidetector CT imaging of the chest was performed during intravenous contrast administration. CONTRAST:  90m OMNIPAQUE IOHEXOL 300 MG/ML  SOLN COMPARISON:  CT chest dated 09/01/2015. FINDINGS: No interval change from recent CT. Specifically, no evidence of traumatic injury to the chest. Mediastinum/Nodes: Heart is normal in size. No pericardial effusion. Atherosclerotic calcifications of the aortic arch. Right chest port terminates in the upper right atrium. Thoracic lymphadenopathy, including: --necrotic left chest wall/ subpectoral lymph nodes, measuring 14 mm (series 2/ image 9) and 17 mm short axis (series 2/ image 10), previously 13 and 20 mm respectively --mild mediastinal lymphadenopathy, including an 11 mm low right paratracheal node (series 2/image 19), previously 10 mm Status post bilateral axillary lymph node dissection. Lungs/Pleura: Bilateral pulmonary metastases, predominantly on the right, including: --10 mm nodule in the posterior right upper lobe (series 6/ image 10), previously 13 mm --17 x 11 mm subpleural nodule in the right middle lobe (series 6/ image 25), previously 21 x 12 mm --13 x 15 mm nodule in the right lower lobe (series 6/ image 32), previously 17 x 19 mm No focal consolidation. Mild centrilobular and paraseptal emphysematous changes.  No pleural effusion or pneumothorax. Upper abdomen: Visualized upper abdomen is unchanged, noting mild pancreatic and extrahepatic ductal dilatation. Musculoskeletal: 2.4 x 2.5 cm enhancing mass in the upper central left breast (series 2/image 16), unchanged. 6.6 x 5.0 cm enhancing mass in the pectoralis muscle deep to the left breast (series 2/image 15), previously 6.2 x 5.8  cm when measured in a similar fashion. Additional surgical clips in the lower/central left breast (series 2/images 26-27). Overlying skin thickening. Postoperative seroma in the upper/outer right breast/axilla (series 2/images 21). Adjacent surgical clips. Overlying skin thickening. Degenerative changes of the thoracic spine. Old left posterolateral 6th and 7th rib fracture deformities. Median sternotomy. IMPRESSION: No interval change from recent CT. Specifically, no evidence of traumatic injury to the chest. No evidence of chest wall abscess. Stable left breast/chest wall recurrence, as above. Pulmonary metastatic disease has mildly improved from the recent CT. Stable thoracic nodal metastases. Electronically Signed   By: Julian Hy M.D.   On: 09/20/2015 11:26   Dg Chest Port 1 View  09/20/2015  CLINICAL DATA:  Fever tonight. EXAM: PORTABLE CHEST 1 VIEW COMPARISON:  Chest CT earlier today at 1026 hour. FINDINGS: Tip of the right chest port at the atrial caval junction. No new consolidation to suggest pneumonia. Cardiomediastinal contours are unchanged, right hilar prominence secondary to prominent right pulmonary artery. Pulmonary nodules on prior CT are not as well seen radiographically. No pulmonary edema. IMPRESSION: No evidence of pneumonia. Pulmonary metastatic disease, better appreciated on CT earlier this day. Electronically Signed   By: Jeb Levering M.D.   On: 09/20/2015 22:26   I have personally reviewed and evaluated these images and lab results as part of my medical decision-making.   EKG Interpretation None       MDM   Final diagnoses:  Fever, unspecified fever cause  Constipation, unspecified constipation type    Patient's temperature improved. Her heart rate improves. She has a large hard formed bowel movement here. Rectal exam shows no residual stool in the rectal vault. Her exam is otherwise normal. Her doctor has improved as well.  No source for infection. Does have leukocytosis. However received Neulasta today. Has adequate neutrophils without neutropenia. Normal chest x-ray without hypoxemia. Urine pending.  She will be appropriate for outpatient treatment, she does not have neutropenia. We'll need a bowel regimen if she is to continue her pain medication.     Tanna Furry, MD 09/23/15 1640

## 2015-09-21 ENCOUNTER — Encounter: Payer: Self-pay | Admitting: Hematology and Oncology

## 2015-09-21 LAB — URINALYSIS, ROUTINE W REFLEX MICROSCOPIC
BILIRUBIN URINE: NEGATIVE
Glucose, UA: NEGATIVE mg/dL
Ketones, ur: NEGATIVE mg/dL
Leukocytes, UA: NEGATIVE
NITRITE: NEGATIVE
PROTEIN: NEGATIVE mg/dL
pH: 5.5 (ref 5.0–8.0)

## 2015-09-21 LAB — URINE MICROSCOPIC-ADD ON

## 2015-09-21 MED ORDER — DOCUSATE SODIUM 100 MG PO CAPS: 100.0000 mg | ORAL_CAPSULE | Freq: Two times a day (BID) | ORAL | Status: DC

## 2015-09-21 MED ORDER — MAGNESIUM CITRATE PO SOLN: 1.0000 | Freq: Once | ORAL | Status: DC

## 2015-09-21 MED ORDER — HEPARIN SOD (PORK) LOCK FLUSH 100 UNIT/ML IV SOLN
INTRAVENOUS | Status: AC
  Administered 2015-09-21: 02:00:00
  Filled 2015-09-21: qty 5

## 2015-09-21 NOTE — ED Notes (Signed)
Pt alert & oriented x4, stable gait. Patient given discharge instructions, paperwork & prescription(s). Patient verbalized understanding. Pt left department in wheelchair escorted by staff. Pt left w/ no further questions. 

## 2015-09-21 NOTE — Discharge Instructions (Signed)
Constipation, Adult Constipation is when a person:  Poops (has a bowel movement) less than 3 times a week.  Has a hard time pooping.  Has poop that is dry, hard, or bigger than normal. HOME CARE   Eat foods with a lot of fiber in them. This includes fruits, vegetables, beans, and whole grains such as brown rice.  Avoid fatty foods and foods with a lot of sugar. This includes french fries, hamburgers, cookies, candy, and soda.  If you are not getting enough fiber from food, take products with added fiber in them (supplements).  Drink enough fluid to keep your pee (urine) clear or pale yellow.  Exercise on a regular basis, or as told by your doctor.  Go to the restroom when you feel like you need to poop. Do not hold it.  Only take medicine as told by your doctor. Do not take medicines that help you poop (laxatives) without talking to your doctor first. GET HELP RIGHT AWAY IF:   You have bright red blood in your poop (stool).  Your constipation lasts more than 4 days or gets worse.  You have belly (abdominal) or butt (rectal) pain.  You have thin poop (as thin as a pencil).  You lose weight, and it cannot be explained. MAKE SURE YOU:   Understand these instructions.  Will watch your condition.  Will get help right away if you are not doing well or get worse.   This information is not intended to replace advice given to you by your health care provider. Make sure you discuss any questions you have with your health care provider.   Document Released: 01/03/2008 Document Revised: 08/07/2014 Document Reviewed: 04/28/2013 Elsevier Interactive Patient Education 2016 [place].  Fever, Adult A fever is an increase in the body's temperature. It is often defined as a temperature of 100 F (38C) or higher. Short mild or moderate fevers often have no long-term effects. They also often do not need treatment. Moderate or high fevers may make you feel uncomfortable. Sometimes,  they can also be a sign of a serious illness or disease. The sweating that may happen with repeated fevers or fevers that last a while may also cause you to not have enough fluid in your body (dehydration). You can take your temperature with a thermometer to see if you have a fever. A measured temperature can change with:  Age.  Time of day.  Where the thermometer is placed:  Mouth (oral).  Rectum (rectal).  Ear (tympanic).  Underarm (axillary).  Forehead (temporal). HOME CARE Pay attention to any changes in your symptoms. Take these actions to help with your condition:  Take over-the-counter and prescription medicines only as told by your doctor. Follow the dosing instructions carefully.  If you were prescribed an antibiotic medicine, take it as told by your doctor. Do not stop taking the antibiotic even if you start to feel better.  Rest as needed.  Drink enough fluid to keep your pee (urine) clear or pale yellow.  Sponge yourself or bathe with room-temperature water as needed. This helps to lower your body temperature . Do not use ice water.  Do not wear too many blankets or heavy clothes. GET HELP IF:  You throw up (vomit).  You cannot eat or drink without throwing up.  You have watery poop (diarrhea).  It hurts when you pee.  Your symptoms do not get better with treatment.  You have new symptoms.  You feel very weak. GET HELP  RIGHT AWAY IF:  You are short of breath or have trouble breathing.  You are dizzy or you pass out (faint).  You feel confused.  You have signs of not having enough fluid in your body, such as:  A dry mouth.  Peeing less.  Looking pale.  You have very bad pain in your belly (abdomen).  You keep throwing up or having water poop.  You have a skin rash.  Your symptoms suddenly get worse.   This information is not intended to replace advice given to you by your health care provider. Make sure you discuss any questions you  have with your health care provider.   Document Released: 04/25/2008 Document Revised: 04/07/2015 Document Reviewed: 09/10/2014 Elsevier Interactive Patient Education [employer].

## 2015-09-21 NOTE — Progress Notes (Signed)
Faxed notes/labs for denial of oxycontin

## 2015-09-22 ENCOUNTER — Telehealth: Payer: Self-pay

## 2015-09-22 LAB — URINE CULTURE: CULTURE: NO GROWTH

## 2015-09-22 NOTE — Telephone Encounter (Signed)
Pt went to ER 2/20 d/t constipation. They gave her saline. She has had about 20 BM since then, she states they were firm. They Rx colace and mag citrate. She was confirming with [place] it was OK to take these before she purchased them. She does have miralax in the house. She states she cannot afford more medications at present. Instructed her to use the miralax prn, to have at least 1 BM per day. Affirmed she could use the colace and mag citrate prn if the miralax was not sufficient. She was appreciative of information

## 2015-09-23 ENCOUNTER — Telehealth: Payer: Self-pay

## 2015-09-23 DIAGNOSIS — C50512 Malignant neoplasm of lower-outer quadrant of left female breast: Principal | ICD-10-CM

## 2015-09-23 DIAGNOSIS — C50511 Malignant neoplasm of lower-outer quadrant of right female breast: Secondary | ICD-10-CM

## 2015-09-23 MED ORDER — OXYCODONE-ACETAMINOPHEN 5-325 MG PO TABS: ORAL_TABLET | ORAL | Status: DC

## 2015-09-23 MED FILL — OxyCONTIN 10 MG T12A: 10 | 30 days supply | Qty: 60 | Fill #0

## 2015-09-23 NOTE — Telephone Encounter (Signed)
Per Dr. Lindi Adie, pt pain is likely from Neulasta.  Advised pt take claritin.  Called pt and let her know to try Claritin, can also take Tylenol and if pain is bad can take her pain meds.  Pt requested refill of oxycodone. Advised pt would be ready for pickup tomorrow between 9 am and 4 pm.  Pt voiced understanding.

## 2015-09-23 NOTE — Addendum Note (Signed)
Addended by: Prentiss Bells on: 09/23/2015 03:19 PM   Modules accepted: Orders

## 2015-09-23 NOTE — Telephone Encounter (Signed)
Patient called c/o pain in her lower back.  She states that she has been to the ED twice in the past two weeks and would like to go today however [facility] is diverting because of the flu.  She states that she took a half of an oxycodone and she has had some relief in her lower back however it is starting to "get bad again".  She denies fever or any UTI symptoms- when writer was on the phone with her she let out a whimper, states she was using the BR and it hurt while she was urinating. Con Memos, RN to call patient.

## 2015-09-23 NOTE — Telephone Encounter (Signed)
Msg rcvd from [place]/Cindy? From [place].  Accelerated appeal has been approved - no msg for what.  Clinic can call [phone] for any questions.

## 2015-09-24 ENCOUNTER — Ambulatory Visit (HOSPITAL_COMMUNITY): Payer: Medicare Other

## 2015-09-24 ENCOUNTER — Encounter (HOSPITAL_COMMUNITY): Admission: RE | Payer: Self-pay | Source: Ambulatory Visit

## 2015-09-24 ENCOUNTER — Ambulatory Visit (HOSPITAL_COMMUNITY): Admission: RE | Admit: 2015-09-24 | Payer: Medicare Other | Source: Ambulatory Visit | Admitting: General Surgery

## 2015-09-24 SURGERY — INSERTION, TUNNELED CENTRAL VENOUS DEVICE, WITH PORT
Anesthesia: General | Site: Breast

## 2015-09-26 LAB — CULTURE, BLOOD (ROUTINE X 2)
CULTURE: NO GROWTH
Culture: NO GROWTH

## 2015-09-27 ENCOUNTER — Telehealth: Payer: Self-pay

## 2015-09-27 MED FILL — OXYCODONE/APAP 5/325MG: 5-325 | 15 days supply | Qty: 60 | Fill #0

## 2015-09-27 NOTE — Assessment & Plan Note (Signed)
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 N1 M0 stage IIB  Radiology CT chest abdomen pelvis 05/28/2015: Developing subpectoral masses 3.5 cm and 4.5 cm. Four lung nodules the largest 1.4 cm in the right lung 1.7 cm sclerotic right iliac bone lesion concerning for metastatic disease Left subpectoral mass biopsy 06/21/2015: Invasive high-grade ductal carcinoma ER 5%, PR 0%, HER-2 negative ratio 1.29 Prior treatment: Xeloda 1500 mg by mouth twice a day started 07/07/2015 stopped 09/03/2015 (stopped for progression)  Bone metastases: Xgeva every 6 weeks with Calcium + D started January 2017 CT chest abdomen pelvis 09/01/15: Progressive left chest wall recurrence with an enlarging mass and severa; new sub cutaneous nodules. Progressive lung nodules -------------------------------------------------------------------------------------------------------------------------- Treatment plan: Carboplatin day 1 and gemcitabine days 1 and 8 every 3 weeks with Neulasta started 09/10/2015 Current treatment: Today is cycle 1 day 1 Carboplatin and Gemcitabine  Left chest wall mass redness and pain: Ct chest did not show any sign of infection Pain issues: we added Oxycontin long acting pain medication today. Insomnia:  D/Ced Ativan and gave her restoril.  RTC in 3 weeks for cycle 3

## 2015-09-27 NOTE — Telephone Encounter (Signed)
Patient called to discuss her pain medications and the difference between the oxycontin/ oxycodone and how and when to take these.  She also was asking about her lymphedema sleeve and if it promotes tumor growth.  Writer advised her on the sleeve and encouraged her to put it on.

## 2015-09-28 ENCOUNTER — Other Ambulatory Visit (HOSPITAL_BASED_OUTPATIENT_CLINIC_OR_DEPARTMENT_OTHER): Payer: Medicare Other

## 2015-09-28 ENCOUNTER — Telehealth: Payer: Self-pay | Admitting: Hematology and Oncology

## 2015-09-28 ENCOUNTER — Ambulatory Visit: Payer: Medicare Other

## 2015-09-28 ENCOUNTER — Encounter: Payer: Self-pay | Admitting: Hematology and Oncology

## 2015-09-28 ENCOUNTER — Ambulatory Visit (HOSPITAL_BASED_OUTPATIENT_CLINIC_OR_DEPARTMENT_OTHER): Payer: Medicare Other | Admitting: Hematology and Oncology

## 2015-09-28 VITALS — BP 124/76 | HR 96 | Temp 98.6°F | Resp 18 | Ht 62.0 in | Wt 138.7 lb

## 2015-09-28 DIAGNOSIS — C50812 Malignant neoplasm of overlapping sites of left female breast: Principal | ICD-10-CM

## 2015-09-28 DIAGNOSIS — C50919 Malignant neoplasm of unspecified site of unspecified female breast: Secondary | ICD-10-CM

## 2015-09-28 DIAGNOSIS — D6481 Anemia due to antineoplastic chemotherapy: Secondary | ICD-10-CM | POA: Diagnosis not present

## 2015-09-28 DIAGNOSIS — C50912 Malignant neoplasm of unspecified site of left female breast: Secondary | ICD-10-CM | POA: Diagnosis not present

## 2015-09-28 DIAGNOSIS — C50512 Malignant neoplasm of lower-outer quadrant of left female breast: Secondary | ICD-10-CM

## 2015-09-28 DIAGNOSIS — C50511 Malignant neoplasm of lower-outer quadrant of right female breast: Secondary | ICD-10-CM

## 2015-09-28 DIAGNOSIS — C50911 Malignant neoplasm of unspecified site of right female breast: Secondary | ICD-10-CM

## 2015-09-28 DIAGNOSIS — C50811 Malignant neoplasm of overlapping sites of right female breast: Secondary | ICD-10-CM

## 2015-09-28 DIAGNOSIS — C7951 Secondary malignant neoplasm of bone: Secondary | ICD-10-CM

## 2015-09-28 DIAGNOSIS — D509 Iron deficiency anemia, unspecified: Secondary | ICD-10-CM | POA: Insufficient documentation

## 2015-09-28 LAB — CBC WITH DIFFERENTIAL/PLATELET
BASO%: 0.9 % (ref 0.0–2.0)
BASOS ABS: 0.5 10*3/uL — AB (ref 0.0–0.1)
EOS%: 0.2 % (ref 0.0–7.0)
Eosinophils Absolute: 0.1 10*3/uL (ref 0.0–0.5)
HEMATOCRIT: 27.8 % — AB (ref 34.8–46.6)
HEMOGLOBIN: 8.5 g/dL — AB (ref 11.6–15.9)
LYMPH#: 4.6 10*3/uL — AB (ref 0.9–3.3)
LYMPH%: 8.3 % — AB (ref 14.0–49.7)
MCH: 23.6 pg — ABNORMAL LOW (ref 25.1–34.0)
MCHC: 30.4 g/dL — ABNORMAL LOW (ref 31.5–36.0)
MCV: 77.6 fL — AB (ref 79.5–101.0)
MONO#: 2 10*3/uL — AB (ref 0.1–0.9)
MONO%: 3.5 % (ref 0.0–14.0)
NEUT#: 48.6 10*3/uL — ABNORMAL HIGH (ref 1.5–6.5)
NEUT%: 87.1 % — AB (ref 38.4–76.8)
Platelets: 152 10*3/uL (ref 145–400)
RBC: 3.59 10*6/uL — AB (ref 3.70–5.45)
RDW: 18.2 % — ABNORMAL HIGH (ref 11.2–14.5)
WBC: 55.8 10*3/uL (ref 3.9–10.3)
nRBC: 3 % — ABNORMAL HIGH (ref 0–0)

## 2015-09-28 LAB — COMPREHENSIVE METABOLIC PANEL
ALT: 24 U/L (ref 0–55)
AST: 39 U/L — AB (ref 5–34)
Albumin: 3.2 g/dL — ABNORMAL LOW (ref 3.5–5.0)
Alkaline Phosphatase: 157 U/L — ABNORMAL HIGH (ref 40–150)
Anion Gap: 11 mEq/L (ref 3–11)
BILIRUBIN TOTAL: 0.41 mg/dL (ref 0.20–1.20)
BUN: 6.9 mg/dL — AB (ref 7.0–26.0)
CO2: 25 meq/L (ref 22–29)
Calcium: 9.1 mg/dL (ref 8.4–10.4)
Chloride: 105 mEq/L (ref 98–109)
Creatinine: 0.8 mg/dL (ref 0.6–1.1)
EGFR: 86 mL/min/{1.73_m2} — AB (ref >90)
GLUCOSE: 115 mg/dL (ref 70–140)
POTASSIUM: 3.3 meq/L — AB (ref 3.5–5.1)
SODIUM: 141 meq/L (ref 136–145)
TOTAL PROTEIN: 6.8 g/dL (ref 6.4–8.3)

## 2015-09-28 NOTE — Progress Notes (Signed)
Patient Care Team: [name], MD as PCP - General (Family Medicine) [name], MD (Cardiology)  DIAGNOSIS: Bilateral breast cancer [facility])   Staging form: Breast, AJCC 7th Edition     Clinical: Stage IIB (T2, N1, cM0) - Unsigned       Staging comments: Staged at breast conference 08/13/13      Pathologic: No stage assigned - Unsigned   SUMMARY OF ONCOLOGIC HISTORY:   Bilateral breast cancer ([place])   07/23/2013 Mammogram Bilateral breast masses. 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 N1 M0 stage IIB ER 100%, PR 0%, HER-2    06/17/2014 -  Radiation Therapy Adjuvant radiation therapy   09/14/2014 -  Anti-estrogen oral therapy Anastrozole 1 mg daily   05/28/2015 Imaging CT scans: Enlarging subpectoral masses 3.1 x 3.5 cm, posteriorly lower density mass 4.5 x 2.1 cm, several right-sided lung nodules right lower lobe 1.4 cm, 3 other right lung nodules, 1.7 cm right iliac bone lesion   06/21/2015 Procedure Left subpectoral mass biopsy: Invasive high-grade  ductal carcinoma ER 5%, PR 0%, HER-2 negative ratio 1.29   09/01/2015 Imaging Left chest wall mass increased in size 7.2 x 5.1 cm, multiple subcutaneous nodules, increase in the lung nodules both in number as well as in the size of existing nodules   09/10/2015 -  Chemotherapy Carboplatin, gemcitabine days 1 and 8 q 3 weeks    CHIEF COMPLIANT:  Follow-up after recent concern for left chest wall cellulitis  INTERVAL HISTORY: Paula Navarro is a  65 year old with above-mentioned history of metastatic breast cancer with a very large chest wall mass extending into the left axilla who noticed redness at the site of the mass and was put on oral antibiotic. She even had a CT of the chest which did not show any evidence of infection or abscess. She tells me today the redness has disappeared and the pain is much better with OxyContin. Although at first she noticed some dizziness  With OxyContin. She does feel more tired than usual. She has not taken the second antibiotic of Keflex that was prescribed by our nurse practitioner. She continues to have discomfort in the chest wall mass when she bumps into objects.  REVIEW OF SYSTEMS:   Constitutional: Denies fevers, chills or abnormal weight loss , complains of fatigue Eyes: Denies blurriness of vision Ears, nose, mouth, throat, and face: Denies mucositis or sore throat Respiratory: Denies cough, dyspnea or wheezes Cardiovascular: Denies palpitation, chest discomfort Gastrointestinal:  Denies nausea, heartburn or change in bowel habits Skin:  No redness over the chest wall mass Lymphatics: Denies new lymphadenopathy or easy bruising Neurological:Denies numbness, tingling or new weaknesses Behavioral/Psych: Mood is stable, no new changes  Extremities:  Left arm lymphedema Breast:  Large left chest wall mass extending to the left axilla accompanied by swelling of the left arm from lymphedema All other systems were reviewed with the patient and are negative.  I  have reviewed the past medical history, past surgical history, social history and family history with the patient and they are unchanged from previous note.  ALLERGIES:  is allergic to codeine and morphine and related.  MEDICATIONS:  Current Outpatient Prescriptions  Medication Sig Dispense Refill  . albuterol (PROVENTIL HFA;VENTOLIN HFA) 108 (90 Base) MCG/ACT inhaler Inhale 1-2 puffs into the lungs every 6 (six) hours as needed for wheezing or shortness of breath. 1 Inhaler 0  . B Complex-C (SUPER B COMPLEX PO) Take 1 tablet by mouth daily.    . calcium-vitamin D (OSCAL-500) 500-400 MG-UNIT tablet Take 1 tablet by mouth 2 (two) times daily. 60 tablet 3  . docusate sodium (COLACE) 100 MG capsule Take 1 capsule (100 mg total) by mouth every 12 (twelve) hours. 60 capsule 0  . gabapentin (NEURONTIN) 300 MG capsule TAKE 2 CAPSULES (600 MG TOTAL) BY MOUTH 3 (THREE) TIMES DAILY. 180 capsule 5  . lidocaine-prilocaine (EMLA) cream Apply to affected area once (Patient taking differently: Apply 1 application topically as needed. Apply to affected area once) 30 g 3  . LORazepam (ATIVAN) 1 MG tablet Take 1 mg by mouth at bedtime as needed for anxiety or sleep.   0  . magnesium citrate SOLN Take 296 mLs (1 Bottle total) by mouth once. 195 mL 1  . ondansetron (ZOFRAN) 8 MG tablet Take 1 tablet (8 mg total) by mouth 2 (two) times daily as needed for refractory nausea / vomiting. Start on day 3 after carboplatin chemo. 30 tablet 1  . oxyCODONE (OXYCONTIN) 10 mg 12 hr tablet Take 1 tablet (10 mg total) by mouth every 12 (twelve) hours. 60 tablet 0  . oxyCODONE-acetaminophen (PERCOCET/ROXICET) 5-325 MG tablet 1 tabs PO q6h prn pain 60 tablet 0  . prochlorperazine (COMPAZINE) 10 MG tablet Take 1 tablet (10 mg total) by mouth every 6 (six) hours as needed (Nausea or vomiting). 30 tablet 1  . temazepam (RESTORIL) 30 MG capsule Take 1 capsule (30 mg total) by mouth at bedtime as needed for sleep. 30 capsule 0  .  amoxicillin (AMOXIL) 500 MG capsule Take 500 mg by mouth 2 (two) times daily. Reported on 09/28/2015  0  . cephALEXin (KEFLEX) 500 MG capsule Take 1 capsule (500 mg total) by mouth 4 (four) times daily. (Patient not taking: Reported on 09/28/2015) 40 capsule 0   No current facility-administered medications for this visit.    PHYSICAL EXAMINATION: ECOG PERFORMANCE STATUS: 1 - Symptomatic but completely ambulatory  Filed Vitals:   09/28/15 1047  BP: 124/76  Pulse: 96  Temp: 98.6 F (37 C)  Resp: 18   Filed Weights   09/28/15 1047  Weight: 138 lb 11.2 oz (62.914 kg)    GENERAL:alert, no distress and comfortable SKIN: skin color, texture, turgor are normal, no rashes or significant lesions EYES: normal, Conjunctiva are pink and non-injected, sclera clear OROPHARYNX:no exudate, no erythema and lips, buccal mucosa, and tongue normal  NECK: supple, thyroid normal size, non-tender, without nodularity LYMPH:  no palpable lymphadenopathy in the cervical, axillary or inguinal LUNGS: clear to auscultation and percussion with normal  breathing effort HEART: regular rate & rhythm and no murmurs and no lower extremity edema ABDOMEN:abdomen soft, non-tender and normal bowel sounds MUSCULOSKELETAL:no cyanosis of digits and no clubbing  NEURO: alert & oriented x 3 with fluent speech, no focal motor/sensory deficits EXTREMITIES: No lower extremity edema BREAST: very large left chest wall mass with extension of the left axilla. This is much smaller than before. LABORATORY DATA:  I have reviewed the data as listed   Chemistry      Component Value Date/Time   NA 135 09/20/2015 2209   NA 137 09/17/2015 1134   K 4.2 09/20/2015 2209   K 4.3 09/17/2015 1134   CL 99* 09/20/2015 2209   CO2 26 09/20/2015 2209   CO2 27 09/17/2015 1134   BUN 11 09/20/2015 2209   BUN 10.5 09/17/2015 1134   CREATININE 0.65 09/20/2015 2209   CREATININE 0.8 09/17/2015 1134      Component Value Date/Time   CALCIUM 8.7*  09/20/2015 2209   CALCIUM 9.7 09/17/2015 1134   ALKPHOS 73 09/17/2015 1134   AST 48* 09/17/2015 1134   ALT 60* 09/17/2015 1134   BILITOT 0.87 09/17/2015 1134       Lab Results  Component Value Date   WBC 55.8* 09/28/2015   HGB 8.5* 09/28/2015   HCT 27.8* 09/28/2015   MCV 77.6* 09/28/2015   PLT 152 09/28/2015   NEUTROABS 48.6* 09/28/2015   ASSESSMENT & PLAN:  Bilateral breast cancer ([place]) Treated with neoadjuvant chemotherapy followed by surgery 04/08/2014, radiation treatment completed 07/17/2014, antiestrogen therapy with anastrozole 09/14/2014 Left breast T2, N0, M0, IDC grade 3; 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 N1 M0 stage IIB  Radiology CT chest abdomen pelvis 05/28/2015: Developing subpectoral masses 3.5 cm and 4.5 cm. Four lung nodules the largest 1.4 cm in the right lung 1.7 cm sclerotic right iliac bone lesion concerning for metastatic disease Left subpectoral mass biopsy 06/21/2015: Invasive high-grade ductal carcinoma ER 5%, PR 0%, HER-2 negative ratio 1.29 Prior treatment: Xeloda 1500 mg by mouth twice a day started 07/07/2015 stopped 09/03/2015 (stopped for progression)  Bone metastases: Xgeva every 6 weeks with Calcium + D started January 2017 CT chest abdomen pelvis 09/01/15: Progressive left chest wall recurrence with an enlarging mass and severa; new sub cutaneous nodules. Progressive lung nodules -------------------------------------------------------------------------------------------------------------------------- Treatment plan: Carboplatin day 1 and gemcitabine days 1 and 8 every 3 weeks with Neulasta started 09/10/2015 Current treatment: cycle 1 Carboplatin and Gemcitabine  Day 1, 8  Started 09/10/2015 Chemotherapy toxicities: 1.  Fatigue 2.  Anemia due to chemotherapy hemoglobin is 8.5 today.  We would like obtain iron studies with the next blood work to be done with next chemotherapy. 3.  Severe leukocytosis with neutrophils markedly increased due to Neulasta: I will discontinue Neulasta with subsequent chemotherapy treatments.  Clinically the left chest wall mass appears to be smaller than before.  Left chest wall mass redness and pain: Ct chest did not show any sign of infection Pain issues: we added Oxycontin long acting pain medication.  She is tolerating the treatment relatively well. Her pain is improved. Insomnia:  D/Ced Ativan and gave her restoril.   return to clinic with cycle 3 of chemotherapy   No orders of the defined types were placed in this encounter.   The patient has a good understanding  of the overall plan. she agrees with it. she will call with any problems that may develop before the next visit here.   Rulon Eisenmenger, MD 09/28/2015

## 2015-09-28 NOTE — Telephone Encounter (Signed)
Opened in error see previous note

## 2015-09-28 NOTE — Telephone Encounter (Signed)
Patient call back and confirmed tx appointment for 3/3 @ 8:15 am.

## 2015-09-28 NOTE — Telephone Encounter (Signed)
Appointments previously scheduled today for 3/7. 3/21, 3/28, 4/11, and 4/18 cxd per VG. Avs report/appointments were retrieved from patient and patient informed per VG schedule would be changed and she would be contacted today re an appointment for 3/3. Per VG patient to return for tx 3/3 (day 1 and day 8) with Xgeva injection to be given 3/3 and every 6 weeks. Also per VG patient not receiving zometa and will be seen by HB for f/u on 3/24 in his absence. Added new appointments for treatment 3/3 (day 1 and day 8) every 3 weeks - HB 3/24 with lab/tx - xgeva 3/3 and every 6 weeks. Left message for patient re appointment for 3/3 @ 8:15 am and patient to get new schedule when she comes in 3/3. Patient was made aware prior to leaving today that I would call re 3/3 and she would get a new schedule then.

## 2015-10-01 ENCOUNTER — Other Ambulatory Visit: Payer: Self-pay

## 2015-10-01 ENCOUNTER — Other Ambulatory Visit: Payer: Self-pay | Admitting: Hematology and Oncology

## 2015-10-01 ENCOUNTER — Other Ambulatory Visit: Payer: Self-pay | Admitting: Medical Oncology

## 2015-10-01 ENCOUNTER — Ambulatory Visit: Payer: Medicare Other

## 2015-10-01 ENCOUNTER — Other Ambulatory Visit: Payer: Self-pay | Admitting: *Deleted

## 2015-10-01 ENCOUNTER — Ambulatory Visit (HOSPITAL_BASED_OUTPATIENT_CLINIC_OR_DEPARTMENT_OTHER): Payer: Medicare Other

## 2015-10-01 ENCOUNTER — Other Ambulatory Visit (HOSPITAL_BASED_OUTPATIENT_CLINIC_OR_DEPARTMENT_OTHER): Payer: Medicare Other

## 2015-10-01 VITALS — BP 117/62 | HR 84 | Temp 98.3°F | Resp 18

## 2015-10-01 DIAGNOSIS — C50511 Malignant neoplasm of lower-outer quadrant of right female breast: Secondary | ICD-10-CM

## 2015-10-01 DIAGNOSIS — Z5111 Encounter for antineoplastic chemotherapy: Secondary | ICD-10-CM | POA: Diagnosis not present

## 2015-10-01 DIAGNOSIS — C50912 Malignant neoplasm of unspecified site of left female breast: Secondary | ICD-10-CM | POA: Diagnosis not present

## 2015-10-01 DIAGNOSIS — C50919 Malignant neoplasm of unspecified site of unspecified female breast: Secondary | ICD-10-CM

## 2015-10-01 DIAGNOSIS — C50911 Malignant neoplasm of unspecified site of right female breast: Secondary | ICD-10-CM | POA: Diagnosis not present

## 2015-10-01 DIAGNOSIS — C50812 Malignant neoplasm of overlapping sites of left female breast: Principal | ICD-10-CM

## 2015-10-01 DIAGNOSIS — C50811 Malignant neoplasm of overlapping sites of right female breast: Secondary | ICD-10-CM

## 2015-10-01 DIAGNOSIS — C50512 Malignant neoplasm of lower-outer quadrant of left female breast: Secondary | ICD-10-CM

## 2015-10-01 DIAGNOSIS — C7951 Secondary malignant neoplasm of bone: Secondary | ICD-10-CM | POA: Diagnosis not present

## 2015-10-01 LAB — CBC WITH DIFFERENTIAL/PLATELET
BASO%: 0.2 % (ref 0.0–2.0)
Basophils Absolute: 0.1 10*3/uL (ref 0.0–0.1)
EOS ABS: 0.2 10*3/uL (ref 0.0–0.5)
EOS%: 0.3 % (ref 0.0–7.0)
HEMATOCRIT: 26.6 % — AB (ref 34.8–46.6)
HEMOGLOBIN: 8 g/dL — AB (ref 11.6–15.9)
LYMPH%: 10.2 % — ABNORMAL LOW (ref 14.0–49.7)
MCH: 23.3 pg — ABNORMAL LOW (ref 25.1–34.0)
MCHC: 30.1 g/dL — ABNORMAL LOW (ref 31.5–36.0)
MCV: 77.6 fL — AB (ref 79.5–101.0)
MONO#: 3.8 10*3/uL — AB (ref 0.1–0.9)
MONO%: 8.3 % (ref 0.0–14.0)
NEUT%: 81 % — ABNORMAL HIGH (ref 38.4–76.8)
NEUTROS ABS: 36.9 10*3/uL — AB (ref 1.5–6.5)
RBC: 3.43 10*6/uL — ABNORMAL LOW (ref 3.70–5.45)
RDW: 18.5 % — AB (ref 11.2–14.5)
WBC: 45.5 10*3/uL — AB (ref 3.9–10.3)
lymph#: 4.6 10*3/uL — ABNORMAL HIGH (ref 0.9–3.3)

## 2015-10-01 LAB — COMPREHENSIVE METABOLIC PANEL
ALBUMIN: 2.9 g/dL — AB (ref 3.5–5.0)
ALK PHOS: 139 U/L (ref 40–150)
ALT: 21 U/L (ref 0–55)
ANION GAP: 11 meq/L (ref 3–11)
AST: 41 U/L — ABNORMAL HIGH (ref 5–34)
BILIRUBIN TOTAL: 0.45 mg/dL (ref 0.20–1.20)
BUN: 6 mg/dL — ABNORMAL LOW (ref 7.0–26.0)
CALCIUM: 8.8 mg/dL (ref 8.4–10.4)
CO2: 25 mEq/L (ref 22–29)
Chloride: 106 mEq/L (ref 98–109)
Creatinine: 0.8 mg/dL (ref 0.6–1.1)
GLUCOSE: 142 mg/dL — AB (ref 70–140)
Potassium: 3.4 mEq/L — ABNORMAL LOW (ref 3.5–5.1)
Sodium: 141 mEq/L (ref 136–145)
TOTAL PROTEIN: 6.4 g/dL (ref 6.4–8.3)

## 2015-10-01 LAB — FERRITIN: Ferritin: 673 ng/ml — ABNORMAL HIGH (ref 9–269)

## 2015-10-01 LAB — IRON AND TIBC
%SAT: 16 % — ABNORMAL LOW (ref 21–57)
Iron: 40 ug/dL — ABNORMAL LOW (ref 41–142)
TIBC: 249 ug/dL (ref 236–444)
UIBC: 209 ug/dL (ref 120–384)

## 2015-10-01 LAB — TECHNOLOGIST REVIEW: Technologist Review: 6

## 2015-10-01 MED ORDER — SODIUM CHLORIDE 0.9 % IV SOLN
800.0000 mg/m2 | Freq: Once | INTRAVENOUS | Status: AC
  Administered 2015-10-01: 1368 mg via INTRAVENOUS
  Filled 2015-10-01: qty 35.98

## 2015-10-01 MED ORDER — SODIUM CHLORIDE 0.9 % IV SOLN
Freq: Once | INTRAVENOUS | Status: AC
  Administered 2015-10-01: 09:00:00 via INTRAVENOUS

## 2015-10-01 MED ORDER — DENOSUMAB 120 MG/1.7ML ~~LOC~~ SOLN
120.0000 mg | Freq: Once | SUBCUTANEOUS | Status: AC
  Administered 2015-10-01: 120 mg via SUBCUTANEOUS
  Filled 2015-10-01: qty 1.7

## 2015-10-01 MED ORDER — SODIUM CHLORIDE 0.9% FLUSH
10.0000 mL | INTRAVENOUS | Status: DC | PRN
  Administered 2015-10-01: 10 mL
  Filled 2015-10-01: qty 10

## 2015-10-01 MED ORDER — SODIUM CHLORIDE 0.9 % IV SOLN
10.0000 mg | Freq: Once | INTRAVENOUS | Status: AC
  Administered 2015-10-01: 10 mg via INTRAVENOUS
  Filled 2015-10-01: qty 1

## 2015-10-01 MED ORDER — CARBOPLATIN CHEMO INJECTION 600 MG/60ML
488.0000 mg | Freq: Once | INTRAVENOUS | Status: AC
  Administered 2015-10-01: 490 mg via INTRAVENOUS
  Filled 2015-10-01: qty 49

## 2015-10-01 MED ORDER — PALONOSETRON HCL INJECTION 0.25 MG/5ML
0.2500 mg | Freq: Once | INTRAVENOUS | Status: AC
  Administered 2015-10-01: 0.25 mg via INTRAVENOUS

## 2015-10-01 MED ORDER — HEPARIN SOD (PORK) LOCK FLUSH 100 UNIT/ML IV SOLN
500.0000 [IU] | Freq: Once | INTRAVENOUS | Status: AC | PRN
  Administered 2015-10-01: 500 [IU]
  Filled 2015-10-01: qty 5

## 2015-10-01 MED ORDER — PALONOSETRON HCL INJECTION 0.25 MG/5ML
INTRAVENOUS | Status: AC
  Filled 2015-10-01: qty 5

## 2015-10-01 NOTE — Progress Notes (Signed)
X-geva injection given by infusion nurse while receiving chemo treatment.

## 2015-10-01 NOTE — Patient Instructions (Addendum)
[facility] Discharge Instructions for Patients Receiving Chemotherapy  Today you received the following chemotherapy agents: Gemzar & Carboplatin  To help prevent nausea and vomiting after your treatment, we encourage you to take your nausea medication as prescribed.   If you develop nausea and vomiting that is not controlled by your nausea medication, call the clinic.   BELOW ARE SYMPTOMS THAT SHOULD BE REPORTED IMMEDIATELY:  *FEVER GREATER THAN 100.5 F  *CHILLS WITH OR WITHOUT FEVER  NAUSEA AND VOMITING THAT IS NOT CONTROLLED WITH YOUR NAUSEA MEDICATION  *UNUSUAL SHORTNESS OF BREATH  *UNUSUAL BRUISING OR BLEEDING  TENDERNESS IN MOUTH AND THROAT WITH OR WITHOUT PRESENCE OF ULCERS  *URINARY PROBLEMS  *BOWEL PROBLEMS  UNUSUAL RASH Items with * indicate a potential emergency and should be followed up as soon as possible.  Feel free to call the clinic you have any questions or concerns. The clinic phone number is [phone].  Please show the [facility] at check-in to the Emergency Department and triage nurse.  Denosumab injection What is this medicine? DENOSUMAB (den oh sue mab) slows bone breakdown. Prolia is used to treat osteoporosis in women after menopause and in men. Delton See is used to prevent bone fractures and other bone problems caused by cancer bone metastases. Delton See is also used to treat giant cell tumor of the bone. This medicine may be used for other purposes; ask your health care provider or pharmacist if you have questions. What should I tell my health care provider before I take this medicine? They need to know if you have any of these conditions: -dental disease -eczema -infection or history of infections -kidney disease or on dialysis -low blood calcium or vitamin D -malabsorption syndrome -scheduled to have surgery or tooth extraction -taking medicine that contains denosumab -thyroid or parathyroid disease -an unusual reaction to  denosumab, other medicines, foods, dyes, or preservatives -pregnant or trying to get pregnant -breast-feeding How should I use this medicine? This medicine is for injection under the skin. It is given by a health care professional in a hospital or clinic setting. If you are getting Prolia, a special MedGuide will be given to you by the pharmacist with each prescription and refill. Be sure to read this information carefully each time. For Prolia, talk to your pediatrician regarding the use of this medicine in children. Special care may be needed. For Delton See, talk to your pediatrician regarding the use of this medicine in children. While this drug may be prescribed for children as young as 13 years for selected conditions, precautions do apply. Overdosage: If you think you have taken too much of this medicine contact a poison control center or emergency room at once. NOTE: This medicine is only for you. Do not share this medicine with others. What if I miss a dose? It is important not to miss your dose. Call your doctor or health care professional if you are unable to keep an appointment. What may interact with this medicine? Do not take this medicine with any of the following medications: -other medicines containing denosumab This medicine may also interact with the following medications: -medicines that suppress the immune system -medicines that treat cancer -steroid medicines like prednisone or cortisone This list may not describe all possible interactions. Give your health care provider a list of all the medicines, herbs, non-prescription drugs, or dietary supplements you use. Also tell them if you smoke, drink alcohol, or use illegal drugs. Some items may interact with your medicine. What  should I watch for while using this medicine? Visit your doctor or health care professional for regular checks on your progress. Your doctor or health care professional may order blood tests and other tests to  see how you are doing. Call your doctor or health care professional if you get a cold or other infection while receiving this medicine. Do not treat yourself. This medicine may decrease your body's ability to fight infection. You should make sure you get enough calcium and vitamin D while you are taking this medicine, unless your doctor tells you not to. Discuss the foods you eat and the vitamins you take with your health care professional. See your dentist regularly. Brush and floss your teeth as directed. Before you have any dental work done, tell your dentist you are receiving this medicine. Do not become pregnant while taking this medicine or for 5 months after stopping it. Women should inform their doctor if they wish to become pregnant or think they might be pregnant. There is a potential for serious side effects to an unborn child. Talk to your health care professional or pharmacist for more information. What side effects may I notice from receiving this medicine? Side effects that you should report to your doctor or health care professional as soon as possible: -allergic reactions like skin rash, itching or hives, swelling of the face, lips, or tongue -breathing problems -chest pain -fast, irregular heartbeat -feeling faint or lightheaded, falls -fever, chills, or any other sign of infection -muscle spasms, tightening, or twitches -numbness or tingling -skin blisters or bumps, or is dry, peels, or red -slow healing or unexplained pain in the mouth or jaw -unusual bleeding or bruising Side effects that usually do not require medical attention (Report these to your doctor or health care professional if they continue or are bothersome.): -muscle pain -stomach upset, gas This list may not describe all possible side effects. Call your doctor for medical advice about side effects. You may report side effects to FDA at 1-800-FDA-1088. Where should I keep my medicine? This medicine is only  given in a clinic, doctor's office, or other health care setting and will not be stored at home. NOTE: This sheet is a summary. It may not cover all possible information. If you have questions about this medicine, talk to your doctor, pharmacist, or health care provider.    2016, [employer]vier/[employer] Standard. (2012-01-15 12:37:47)

## 2015-10-04 ENCOUNTER — Encounter: Payer: Self-pay | Admitting: Hematology and Oncology

## 2015-10-04 NOTE — Progress Notes (Signed)
Patient called wanting to know how much money she had left on her grant and when she could use it. I advised patient of her amount and she could use when she needed while in treatment. Patient was thinking of moving and may need to help with deposit or if she decides to stay may help with rent at current residence. Patient will follow up with me on Fri 3/9 or I will see her in treatment whichever is more convenient on that day. Patient has my number for any additional financial questions or concerns.

## 2015-10-05 ENCOUNTER — Other Ambulatory Visit: Payer: Medicare Other

## 2015-10-05 ENCOUNTER — Ambulatory Visit: Payer: Medicare Other

## 2015-10-08 ENCOUNTER — Other Ambulatory Visit (HOSPITAL_BASED_OUTPATIENT_CLINIC_OR_DEPARTMENT_OTHER): Payer: Medicare Other

## 2015-10-08 ENCOUNTER — Ambulatory Visit: Payer: Medicare Other

## 2015-10-08 ENCOUNTER — Ambulatory Visit (HOSPITAL_BASED_OUTPATIENT_CLINIC_OR_DEPARTMENT_OTHER): Payer: Medicare Other

## 2015-10-08 VITALS — BP 129/70 | HR 75 | Temp 98.3°F

## 2015-10-08 DIAGNOSIS — C50911 Malignant neoplasm of unspecified site of right female breast: Secondary | ICD-10-CM | POA: Diagnosis not present

## 2015-10-08 DIAGNOSIS — C50919 Malignant neoplasm of unspecified site of unspecified female breast: Secondary | ICD-10-CM

## 2015-10-08 DIAGNOSIS — Z5111 Encounter for antineoplastic chemotherapy: Secondary | ICD-10-CM | POA: Diagnosis not present

## 2015-10-08 DIAGNOSIS — C50511 Malignant neoplasm of lower-outer quadrant of right female breast: Secondary | ICD-10-CM

## 2015-10-08 DIAGNOSIS — C50512 Malignant neoplasm of lower-outer quadrant of left female breast: Secondary | ICD-10-CM

## 2015-10-08 DIAGNOSIS — C7800 Secondary malignant neoplasm of unspecified lung: Secondary | ICD-10-CM

## 2015-10-08 DIAGNOSIS — Z95828 Presence of other vascular implants and grafts: Secondary | ICD-10-CM

## 2015-10-08 DIAGNOSIS — C7951 Secondary malignant neoplasm of bone: Secondary | ICD-10-CM | POA: Diagnosis not present

## 2015-10-08 DIAGNOSIS — C50912 Malignant neoplasm of unspecified site of left female breast: Secondary | ICD-10-CM

## 2015-10-08 LAB — CBC WITH DIFFERENTIAL/PLATELET
BASO%: 1.3 % (ref 0.0–2.0)
BASOS ABS: 0.1 10*3/uL (ref 0.0–0.1)
EOS ABS: 0 10*3/uL (ref 0.0–0.5)
EOS%: 0.2 % (ref 0.0–7.0)
HCT: 26.7 % — ABNORMAL LOW (ref 34.8–46.6)
HEMOGLOBIN: 8.5 g/dL — AB (ref 11.6–15.9)
LYMPH%: 30.3 % (ref 14.0–49.7)
MCH: 24.4 pg — AB (ref 25.1–34.0)
MCHC: 31.8 g/dL (ref 31.5–36.0)
MCV: 76.7 fL — AB (ref 79.5–101.0)
MONO#: 0.9 10*3/uL (ref 0.1–0.9)
MONO%: 20.3 % — AB (ref 0.0–14.0)
NEUT%: 47.9 % (ref 38.4–76.8)
NEUTROS ABS: 2.2 10*3/uL (ref 1.5–6.5)
PLATELETS: 344 10*3/uL (ref 145–400)
RBC: 3.48 10*6/uL — ABNORMAL LOW (ref 3.70–5.45)
RDW: 18.6 % — AB (ref 11.2–14.5)
WBC: 4.6 10*3/uL (ref 3.9–10.3)
lymph#: 1.4 10*3/uL (ref 0.9–3.3)

## 2015-10-08 LAB — COMPREHENSIVE METABOLIC PANEL
ALBUMIN: 3.4 g/dL — AB (ref 3.5–5.0)
ALK PHOS: 94 U/L (ref 40–150)
ALT: 33 U/L (ref 0–55)
ANION GAP: 12 meq/L — AB (ref 3–11)
AST: 47 U/L — ABNORMAL HIGH (ref 5–34)
BILIRUBIN TOTAL: 0.36 mg/dL (ref 0.20–1.20)
BUN: 10.6 mg/dL (ref 7.0–26.0)
CALCIUM: 9.7 mg/dL (ref 8.4–10.4)
CO2: 25 mEq/L (ref 22–29)
Chloride: 101 mEq/L (ref 98–109)
Creatinine: 0.8 mg/dL (ref 0.6–1.1)
EGFR: 88 mL/min/{1.73_m2} — AB (ref >90)
Glucose: 131 mg/dl (ref 70–140)
POTASSIUM: 4.2 meq/L (ref 3.5–5.1)
Sodium: 138 mEq/L (ref 136–145)
TOTAL PROTEIN: 7.4 g/dL (ref 6.4–8.3)

## 2015-10-08 MED ORDER — HEPARIN SOD (PORK) LOCK FLUSH 100 UNIT/ML IV SOLN
500.0000 [IU] | Freq: Once | INTRAVENOUS | Status: AC | PRN
  Administered 2015-10-08: 500 [IU]
  Filled 2015-10-08: qty 5

## 2015-10-08 MED ORDER — SODIUM CHLORIDE 0.9% FLUSH
10.0000 mL | INTRAVENOUS | Status: DC | PRN
  Administered 2015-10-08: 10 mL
  Filled 2015-10-08: qty 10

## 2015-10-08 MED ORDER — SODIUM CHLORIDE 0.9 % IV SOLN
Freq: Once | INTRAVENOUS | Status: AC
  Administered 2015-10-08: 11:00:00 via INTRAVENOUS

## 2015-10-08 MED ORDER — PROCHLORPERAZINE MALEATE 10 MG PO TABS
10.0000 mg | ORAL_TABLET | Freq: Once | ORAL | Status: AC
  Administered 2015-10-08: 10 mg via ORAL

## 2015-10-08 MED ORDER — PROCHLORPERAZINE MALEATE 10 MG PO TABS
ORAL_TABLET | ORAL | Status: AC
  Filled 2015-10-08: qty 1

## 2015-10-08 MED ORDER — SODIUM CHLORIDE 0.9 % IV SOLN
800.0000 mg/m2 | Freq: Once | INTRAVENOUS | Status: AC
  Administered 2015-10-08: 1368 mg via INTRAVENOUS
  Filled 2015-10-08: qty 35.98

## 2015-10-08 MED ORDER — SODIUM CHLORIDE 0.9% FLUSH
10.0000 mL | INTRAVENOUS | Status: DC | PRN
  Administered 2015-10-08: 10 mL via INTRAVENOUS
  Filled 2015-10-08: qty 10

## 2015-10-08 NOTE — Progress Notes (Signed)
Patient c/o left upper chest soreness. Ms. Friley states she is only taking Percocet 1/2 tab for her breakthrough pain. Advised patient to increase to one tab per prescription. She also c/o mild to moderate nausea after taking her Percocet. Encouraged patient to take with food and take antiemetics as prescribed. Patient verbalizes understanding.

## 2015-10-08 NOTE — Patient Instructions (Signed)

## 2015-10-08 NOTE — Patient Instructions (Signed)
[name] Discharge Instructions for Patients Receiving Chemotherapy  Today you received the following chemotherapy agents Gemzar  To help prevent nausea and vomiting after your treatment, we encourage you to take your nausea medication as prescribed   If you develop nausea and vomiting that is not controlled by your nausea medication, call the clinic.   BELOW ARE SYMPTOMS THAT SHOULD BE REPORTED IMMEDIATELY:  *FEVER GREATER THAN 100.5 F  *CHILLS WITH OR WITHOUT FEVER  NAUSEA AND VOMITING THAT IS NOT CONTROLLED WITH YOUR NAUSEA MEDICATION  *UNUSUAL SHORTNESS OF BREATH  *UNUSUAL BRUISING OR BLEEDING  TENDERNESS IN MOUTH AND THROAT WITH OR WITHOUT PRESENCE OF ULCERS  *URINARY PROBLEMS  *BOWEL PROBLEMS  UNUSUAL RASH Items with * indicate a potential emergency and should be followed up as soon as possible.  Feel free to call the clinic you have any questions or concerns. The clinic phone number is [phone].  Please show the CHEMO ALERT CARD at check-in to the Emergency Department and triage nurse.   

## 2015-10-11 ENCOUNTER — Telehealth: Payer: Self-pay | Admitting: *Deleted

## 2015-10-11 ENCOUNTER — Other Ambulatory Visit: Payer: Self-pay | Admitting: *Deleted

## 2015-10-11 DIAGNOSIS — C50512 Malignant neoplasm of lower-outer quadrant of left female breast: Principal | ICD-10-CM

## 2015-10-11 DIAGNOSIS — C50511 Malignant neoplasm of lower-outer quadrant of right female breast: Secondary | ICD-10-CM

## 2015-10-11 MED ORDER — OXYCODONE HCL ER 10 MG PO T12A: 10.0000 mg | EXTENDED_RELEASE_TABLET | Freq: Two times a day (BID) | ORAL | Status: DC

## 2015-10-11 MED ORDER — OXYCODONE-ACETAMINOPHEN 5-325 MG PO TABS: ORAL_TABLET | ORAL | Status: DC

## 2015-10-11 NOTE — Telephone Encounter (Signed)
Patient called requesting "refill for Oxycontin 10 mg and Oxycodone 5-325 mg  Not able to drive, a grandchild will come so I need a call to pick up scripts.  Return number when ready for pick up is [phone].  I'm also not feeling well, weaker with trouble getting out of the bed.  My hgb gets lower after the treatments."  Advised she dangle legs before she gets up but to try to ambulate a few times a day.

## 2015-10-11 NOTE — Telephone Encounter (Signed)
Called patient to let her know prescriptions were ready for pick up. She verbalized understanding.

## 2015-10-12 ENCOUNTER — Ambulatory Visit: Payer: Medicare Other

## 2015-10-12 MED FILL — OXYCODONE/APAP 5/325MG: 5-325 | 15 days supply | Qty: 60 | Fill #0

## 2015-10-19 ENCOUNTER — Ambulatory Visit: Payer: Medicare Other | Admitting: Hematology and Oncology

## 2015-10-19 ENCOUNTER — Ambulatory Visit: Payer: Medicare Other

## 2015-10-19 ENCOUNTER — Other Ambulatory Visit: Payer: Medicare Other

## 2015-10-21 MED FILL — OxyCONTIN 10 MG T12A: 10 | 30 days supply | Qty: 60 | Fill #0

## 2015-10-22 ENCOUNTER — Telehealth: Payer: Self-pay | Admitting: Nurse Practitioner

## 2015-10-22 ENCOUNTER — Ambulatory Visit (HOSPITAL_BASED_OUTPATIENT_CLINIC_OR_DEPARTMENT_OTHER): Payer: Medicare Other

## 2015-10-22 ENCOUNTER — Other Ambulatory Visit (HOSPITAL_BASED_OUTPATIENT_CLINIC_OR_DEPARTMENT_OTHER): Payer: Medicare Other

## 2015-10-22 ENCOUNTER — Encounter: Payer: Self-pay | Admitting: Nurse Practitioner

## 2015-10-22 ENCOUNTER — Ambulatory Visit (HOSPITAL_BASED_OUTPATIENT_CLINIC_OR_DEPARTMENT_OTHER): Payer: Medicare Other | Admitting: Nurse Practitioner

## 2015-10-22 VITALS — BP 138/73 | HR 82 | Temp 98.0°F | Resp 18 | Ht 62.0 in | Wt 139.6 lb

## 2015-10-22 DIAGNOSIS — C50911 Malignant neoplasm of unspecified site of right female breast: Secondary | ICD-10-CM

## 2015-10-22 DIAGNOSIS — Z5111 Encounter for antineoplastic chemotherapy: Secondary | ICD-10-CM

## 2015-10-22 DIAGNOSIS — C7989 Secondary malignant neoplasm of other specified sites: Secondary | ICD-10-CM | POA: Diagnosis not present

## 2015-10-22 DIAGNOSIS — C50919 Malignant neoplasm of unspecified site of unspecified female breast: Secondary | ICD-10-CM

## 2015-10-22 DIAGNOSIS — T451X5A Adverse effect of antineoplastic and immunosuppressive drugs, initial encounter: Secondary | ICD-10-CM

## 2015-10-22 DIAGNOSIS — C7951 Secondary malignant neoplasm of bone: Secondary | ICD-10-CM

## 2015-10-22 DIAGNOSIS — C50511 Malignant neoplasm of lower-outer quadrant of right female breast: Secondary | ICD-10-CM

## 2015-10-22 DIAGNOSIS — C50512 Malignant neoplasm of lower-outer quadrant of left female breast: Secondary | ICD-10-CM

## 2015-10-22 DIAGNOSIS — G4700 Insomnia, unspecified: Secondary | ICD-10-CM | POA: Diagnosis not present

## 2015-10-22 DIAGNOSIS — D6481 Anemia due to antineoplastic chemotherapy: Secondary | ICD-10-CM

## 2015-10-22 DIAGNOSIS — C50912 Malignant neoplasm of unspecified site of left female breast: Secondary | ICD-10-CM | POA: Diagnosis not present

## 2015-10-22 DIAGNOSIS — I890 Lymphedema, not elsewhere classified: Secondary | ICD-10-CM

## 2015-10-22 LAB — COMPREHENSIVE METABOLIC PANEL
ALBUMIN: 3.5 g/dL (ref 3.5–5.0)
ALK PHOS: 69 U/L (ref 40–150)
ALT: 28 U/L (ref 0–55)
ANION GAP: 10 meq/L (ref 3–11)
AST: 38 U/L — ABNORMAL HIGH (ref 5–34)
BILIRUBIN TOTAL: 0.6 mg/dL (ref 0.20–1.20)
BUN: 9.5 mg/dL (ref 7.0–26.0)
CALCIUM: 9.5 mg/dL (ref 8.4–10.4)
CO2: 25 mEq/L (ref 22–29)
Chloride: 106 mEq/L (ref 98–109)
Creatinine: 0.9 mg/dL (ref 0.6–1.1)
EGFR: 85 mL/min/{1.73_m2} — AB (ref >90)
Glucose: 107 mg/dl (ref 70–140)
Potassium: 4.1 mEq/L (ref 3.5–5.1)
Sodium: 141 mEq/L (ref 136–145)
TOTAL PROTEIN: 7 g/dL (ref 6.4–8.3)

## 2015-10-22 LAB — CBC WITH DIFFERENTIAL/PLATELET
BASO%: 1.5 % (ref 0.0–2.0)
Basophils Absolute: 0.2 10*3/uL — ABNORMAL HIGH (ref 0.0–0.1)
EOS ABS: 0.5 10*3/uL (ref 0.0–0.5)
EOS%: 4.1 % (ref 0.0–7.0)
HCT: 28.6 % — ABNORMAL LOW (ref 34.8–46.6)
HEMOGLOBIN: 9 g/dL — AB (ref 11.6–15.9)
LYMPH%: 13.5 % — AB (ref 14.0–49.7)
MCH: 24.9 pg — ABNORMAL LOW (ref 25.1–34.0)
MCHC: 31.3 g/dL — ABNORMAL LOW (ref 31.5–36.0)
MCV: 79.6 fL (ref 79.5–101.0)
MONO#: 1.5 10*3/uL — AB (ref 0.1–0.9)
MONO%: 12.4 % (ref 0.0–14.0)
NEUT%: 68.5 % (ref 38.4–76.8)
NEUTROS ABS: 8.2 10*3/uL — AB (ref 1.5–6.5)
PLATELETS: 420 10*3/uL — AB (ref 145–400)
RBC: 3.6 10*6/uL — AB (ref 3.70–5.45)
RDW: 21.4 % — ABNORMAL HIGH (ref 11.2–14.5)
WBC: 11.9 10*3/uL — AB (ref 3.9–10.3)
lymph#: 1.6 10*3/uL (ref 0.9–3.3)

## 2015-10-22 MED ORDER — HEPARIN SOD (PORK) LOCK FLUSH 100 UNIT/ML IV SOLN
500.0000 [IU] | Freq: Once | INTRAVENOUS | Status: AC | PRN
  Administered 2015-10-22: 500 [IU]
  Filled 2015-10-22: qty 5

## 2015-10-22 MED ORDER — SODIUM CHLORIDE 0.9% FLUSH
10.0000 mL | INTRAVENOUS | Status: DC | PRN
  Administered 2015-10-22: 10 mL
  Filled 2015-10-22: qty 10

## 2015-10-22 MED ORDER — PALONOSETRON HCL INJECTION 0.25 MG/5ML
INTRAVENOUS | Status: AC
  Filled 2015-10-22: qty 5

## 2015-10-22 MED ORDER — PALONOSETRON HCL INJECTION 0.25 MG/5ML
0.2500 mg | Freq: Once | INTRAVENOUS | Status: AC
  Administered 2015-10-22: 0.25 mg via INTRAVENOUS

## 2015-10-22 MED ORDER — SODIUM CHLORIDE 0.9 % IV SOLN
10.0000 mg | Freq: Once | INTRAVENOUS | Status: AC
  Administered 2015-10-22: 10 mg via INTRAVENOUS
  Filled 2015-10-22: qty 1

## 2015-10-22 MED ORDER — SODIUM CHLORIDE 0.9 % IV SOLN
Freq: Once | INTRAVENOUS | Status: AC
  Administered 2015-10-22: 10:00:00 via INTRAVENOUS

## 2015-10-22 MED ORDER — SODIUM CHLORIDE 0.9 % IV SOLN
448.0000 mg | Freq: Once | INTRAVENOUS | Status: AC
  Administered 2015-10-22: 450 mg via INTRAVENOUS
  Filled 2015-10-22: qty 45

## 2015-10-22 MED ORDER — SODIUM CHLORIDE 0.9 % IV SOLN
800.0000 mg/m2 | Freq: Once | INTRAVENOUS | Status: AC
  Administered 2015-10-22: 1368 mg via INTRAVENOUS
  Filled 2015-10-22: qty 35.98

## 2015-10-22 NOTE — Telephone Encounter (Signed)
Added ov to sch. Pt will get updated copy in treatment area

## 2015-10-22 NOTE — Patient Instructions (Signed)
[facility] Discharge Instructions for Patients Receiving Chemotherapy  Today you received the following chemotherapy agents: Gemzar and Carboplatin   To help prevent nausea and vomiting after your treatment, we encourage you to take your nausea medication as directed.    If you develop nausea and vomiting that is not controlled by your nausea medication, call the clinic.   BELOW ARE SYMPTOMS THAT SHOULD BE REPORTED IMMEDIATELY:  *FEVER GREATER THAN 100.5 F  *CHILLS WITH OR WITHOUT FEVER  NAUSEA AND VOMITING THAT IS NOT CONTROLLED WITH YOUR NAUSEA MEDICATION  *UNUSUAL SHORTNESS OF BREATH  *UNUSUAL BRUISING OR BLEEDING  TENDERNESS IN MOUTH AND THROAT WITH OR WITHOUT PRESENCE OF ULCERS  *URINARY PROBLEMS  *BOWEL PROBLEMS  UNUSUAL RASH Items with * indicate a potential emergency and should be followed up as soon as possible.  Feel free to call the clinic you have any questions or concerns. The clinic phone number is [phone].  Please show the CHEMO ALERT CARD at check-in to the Emergency Department and triage nurse.   

## 2015-10-22 NOTE — Progress Notes (Signed)
Patient Care Team: [name], MD as PCP - General (Family Medicine) [name], MD (Cardiology)  DIAGNOSIS: Bilateral breast cancer [facility])   Staging form: Breast, AJCC 7th Edition     Clinical: Stage IIB (T2, N1, cM0) - Unsigned       Staging comments: Staged at breast conference 08/13/13      Pathologic: No stage assigned - Unsigned   SUMMARY OF ONCOLOGIC HISTORY:   Bilateral breast cancer ([place])   07/23/2013 Mammogram Bilateral breast masses. 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 N1 M0 stage IIB ER 100%, PR 0%, HER-2    06/17/2014 -  Radiation Therapy Adjuvant radiation therapy   09/14/2014 -  Anti-estrogen oral therapy Anastrozole 1 mg daily   05/28/2015 Imaging CT scans: Enlarging subpectoral masses 3.1 x 3.5 cm, posteriorly lower density mass 4.5 x 2.1 cm, several right-sided lung nodules right lower lobe 1.4 cm, 3 other right lung nodules, 1.7 cm right iliac bone lesion   06/21/2015 Procedure Left subpectoral mass biopsy: Invasive high-grade  ductal carcinoma ER 5%, PR 0%, HER-2 negative ratio 1.29   09/01/2015 Imaging Left chest wall mass increased in size 7.2 x 5.1 cm, multiple subcutaneous nodules, increase in the lung nodules both in number as well as in the size of existing nodules   09/10/2015 -  Chemotherapy Carboplatin, gemcitabine days 1 and 8 q 3 weeks    CHIEF COMPLIANT:  Metastatic breast cancer, Adjuvant chemotherapy.  INTERVAL HISTORY: Paula Navarro is a  65 year old with above-mentioned history of metastatic breast cancer with a very large chest wall mass extending into the left axilla. She is due to start cycle 2 of carboplatin and gemcitabine today. She tolerates the treatments very well, denying nausea, vomiting, or changes in bowel or bladder habits. She has no mouth sores or rashes. Her appetite is good. Her main complaint is insomnia. She did not sleep at all last night despite trazodone use. She uses oxycontin BID for pain and this is well managed. The mass to her left chest wall continues to shrink.   REVIEW OF SYSTEMS:   Constitutional: Denies fevers, chills or abnormal weight loss, complains of fatigue Eyes: Denies blurriness of vision Ears, nose, mouth, throat, and face: Denies mucositis or sore throat Respiratory: Denies cough, dyspnea or wheezes Cardiovascular: Denies palpitation, chest discomfort Gastrointestinal:  Denies nausea, heartburn or change in bowel habits Skin:  No redness over the chest wall mass Lymphatics: Denies new lymphadenopathy or easy bruising Neurological:Denies numbness, tingling or new weaknesses Behavioral/Psych: Mood is stable,  no new changes  Extremities:  Left arm lymphedema Breast:  Large left chest wall mass extending to the left axilla accompanied by swelling of the left arm from lymphedema All other systems were reviewed with the patient and are negative.  I have reviewed the past medical history, past surgical history, social history and family history with the patient and  they are unchanged from previous note.  ALLERGIES:  is allergic to codeine and morphine and related.  MEDICATIONS:  Current Outpatient Prescriptions  Medication Sig Dispense Refill  . B Complex-C (SUPER B COMPLEX PO) Take 1 tablet by mouth daily.    . calcium-vitamin D (OSCAL-500) 500-400 MG-UNIT tablet Take 1 tablet by mouth 2 (two) times daily. 60 tablet 3  . docusate sodium (COLACE) 100 MG capsule Take 1 capsule (100 mg total) by mouth every 12 (twelve) hours. 60 capsule 0  . gabapentin (NEURONTIN) 300 MG capsule TAKE 2 CAPSULES (600 MG TOTAL) BY MOUTH 3 (THREE) TIMES DAILY. 180 capsule 5  . lidocaine-prilocaine (EMLA) cream Apply to affected area once (Patient taking differently: Apply 1 application topically as needed. Apply to affected area once) 30 g 3  . LORazepam (ATIVAN) 1 MG tablet Take 1 mg by mouth at bedtime as needed for anxiety or sleep.   0  . ondansetron (ZOFRAN) 8 MG tablet Take 1 tablet (8 mg total) by mouth 2 (two) times daily as needed for refractory nausea / vomiting. Start on day 3 after carboplatin chemo. 30 tablet 1  . oxyCODONE (OXYCONTIN) 10 mg 12 hr tablet Take 1 tablet (10 mg total) by mouth every 12 (twelve) hours. 60 tablet 0  . oxyCODONE-acetaminophen (PERCOCET/ROXICET) 5-325 MG tablet 1 tabs PO q6h prn pain 60 tablet 0  . albuterol (PROVENTIL HFA;VENTOLIN HFA) 108 (90 Base) MCG/ACT inhaler Inhale 1-2 puffs into the lungs every 6 (six) hours as needed for wheezing or shortness of breath. (Patient not taking: Reported on 10/22/2015) 1 Inhaler 0  . magnesium citrate SOLN Take 296 mLs (1 Bottle total) by mouth once. (Patient not taking: Reported on 10/22/2015) 195 mL 1  . prochlorperazine (COMPAZINE) 10 MG tablet Take 1 tablet (10 mg total) by mouth every 6 (six) hours as needed (Nausea or vomiting). (Patient not taking: Reported on 10/22/2015) 30 tablet 1  . temazepam (RESTORIL) 30 MG capsule Take 1 capsule (30 mg total) by mouth at bedtime as needed for sleep. (Patient  not taking: Reported on 10/22/2015) 30 capsule 0   No current facility-administered medications for this visit.    PHYSICAL EXAMINATION: ECOG PERFORMANCE STATUS: 1 - Symptomatic but completely ambulatory  Filed Vitals:   10/22/15 0856  BP: 138/73  Pulse: 82  Temp: 98 F (36.7 C)  Resp: 18   Filed Weights   10/22/15 0856  Weight: 139 lb 9.6 oz (63.322 kg)    GENERAL:alert, no distress and comfortable SKIN: skin color, texture, turgor are normal, no rashes or significant lesions EYES: normal, Conjunctiva are pink and non-injected, sclera clear OROPHARYNX:no exudate, no erythema and lips, buccal mucosa, and tongue normal  NECK: supple, thyroid normal size, non-tender, without nodularity LYMPH:  no palpable lymphadenopathy in the cervical, axillary or inguinal LUNGS: clear to auscultation and percussion with normal breathing effort HEART: regular rate & rhythm and no murmurs and no lower extremity edema ABDOMEN:abdomen soft, non-tender and normal bowel sounds MUSCULOSKELETAL:no cyanosis of digits and no clubbing  NEURO: alert & oriented x 3 with fluent speech, no focal motor/sensory deficits EXTREMITIES: No lower extremity edema BREAST:  very large left chest wall mass with extension of the left axilla. This is much smaller than before. LABORATORY DATA:  I have reviewed the data as listed   Chemistry      Component Value Date/Time   NA 138 10/08/2015 0940   NA 135 09/20/2015 2209   K 4.2 10/08/2015 0940   K 4.2 09/20/2015 2209   CL 99* 09/20/2015 2209   CO2 25 10/08/2015 0940   CO2 26 09/20/2015 2209   BUN 10.6 10/08/2015 0940   BUN 11 09/20/2015 2209   CREATININE 0.8 10/08/2015 0940   CREATININE 0.65 09/20/2015 2209      Component Value Date/Time   CALCIUM 9.7 10/08/2015 0940   CALCIUM 8.7* 09/20/2015 2209   ALKPHOS 94 10/08/2015 0940   AST 47* 10/08/2015 0940   ALT 33 10/08/2015 0940   BILITOT 0.36 10/08/2015 0940       Lab Results  Component Value Date    WBC 11.9* 10/22/2015   HGB 9.0* 10/22/2015   HCT 28.6* 10/22/2015   MCV 79.6 10/22/2015   PLT 420* 10/22/2015   NEUTROABS 8.2* 10/22/2015   ASSESSMENT & PLAN:  Bilateral breast cancer ([place]) Treated with neoadjuvant chemotherapy followed by surgery 04/08/2014, radiation treatment completed 07/17/2014, antiestrogen therapy with anastrozole 09/14/2014 Left breast T2, N0, M0, IDC grade 3; 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 N1 M0 stage IIB  Radiology CT chest abdomen pelvis 05/28/2015: Developing subpectoral masses 3.5 cm and 4.5 cm. Four lung nodules the largest 1.4 cm in the right lung 1.7 cm sclerotic right iliac bone lesion concerning for metastatic disease Left subpectoral mass biopsy 06/21/2015: Invasive high-grade ductal carcinoma ER 5%, PR 0%, HER-2 negative ratio 1.29 Prior treatment: Xeloda 1500 mg by mouth twice a day started 07/07/2015 stopped 09/03/2015 (stopped for progression)  Bone metastases: Xgeva every 6 weeks with Calcium + D started January 2017 CT chest abdomen pelvis 09/01/15: Progressive left chest wall recurrence with an enlarging mass and severa; new sub cutaneous nodules. Progressive lung nodules -------------------------------------------------------------------------------------------------------------------------- Treatment plan: Carboplatin day 1 and gemcitabine days 1 and 8 every 3 weeks with Neulasta started 09/10/2015  Chemotherapy toxicities: 1.  Fatigue 2.  Anemia due to chemotherapy: hgb improved mildly to 9.0 3.  Severe leukocytosis with neutrophils markedly increased due to Neulasta: Neulasta discontinued with subsequent chemotherapy treatments.  Clinically the left chest wall mass continues to shrink.   Left chest wall mass redness and pain: CT chest did not show any sign of infection Pain  issues: Oxycontin for long acting pain medication. Pain well controlled Insomnia:  D/Ced Ativan and started restoril. Advised to try melatonin 59m as well Lymphedema: referral placed to physical therapy for the lymphedema clinic. Continue to wear compression sleeves daily.  Return to clinic with cycle 4 of chemotherapy   No orders of the defined types were placed in this encounter.   The patient has a good understanding of the overall plan. she agrees with it. she will call with any problems that may develop before the next visit here.   HLaurie Panda NP 10/22/2015

## 2015-10-26 ENCOUNTER — Ambulatory Visit: Payer: Medicare Other

## 2015-10-26 ENCOUNTER — Other Ambulatory Visit: Payer: Medicare Other

## 2015-10-27 ENCOUNTER — Telehealth: Payer: Self-pay

## 2015-10-27 DIAGNOSIS — C50811 Malignant neoplasm of overlapping sites of right female breast: Secondary | ICD-10-CM

## 2015-10-27 DIAGNOSIS — C7801 Secondary malignant neoplasm of right lung: Secondary | ICD-10-CM

## 2015-10-27 DIAGNOSIS — C50812 Malignant neoplasm of overlapping sites of left female breast: Principal | ICD-10-CM

## 2015-10-27 DIAGNOSIS — C7951 Secondary malignant neoplasm of bone: Secondary | ICD-10-CM

## 2015-10-27 DIAGNOSIS — C50919 Malignant neoplasm of unspecified site of unspecified female breast: Secondary | ICD-10-CM

## 2015-10-27 MED ORDER — TRAZODONE HCL 50 MG PO TABS: 50.0000 mg | ORAL_TABLET | Freq: Every day | ORAL | Status: DC

## 2015-10-27 NOTE — Telephone Encounter (Signed)
Pt called c/o inability to sleep.  Requesting "sleeping pill".  States lorazepam, temazepam, benadryl and melatonin have all been tried - nothing works.   LMOVM - [facility] called in to [facility] - [facility] week supply.  Pt to call clinic next week and let us know if it worked for her.

## 2015-10-29 ENCOUNTER — Other Ambulatory Visit (HOSPITAL_BASED_OUTPATIENT_CLINIC_OR_DEPARTMENT_OTHER): Payer: Medicare Other

## 2015-10-29 ENCOUNTER — Ambulatory Visit (HOSPITAL_BASED_OUTPATIENT_CLINIC_OR_DEPARTMENT_OTHER): Payer: Medicare Other | Admitting: Nurse Practitioner

## 2015-10-29 ENCOUNTER — Telehealth: Payer: Self-pay

## 2015-10-29 ENCOUNTER — Ambulatory Visit (HOSPITAL_BASED_OUTPATIENT_CLINIC_OR_DEPARTMENT_OTHER): Payer: Medicare Other

## 2015-10-29 VITALS — BP 138/76 | HR 77 | Temp 98.9°F | Resp 18

## 2015-10-29 DIAGNOSIS — C50911 Malignant neoplasm of unspecified site of right female breast: Secondary | ICD-10-CM

## 2015-10-29 DIAGNOSIS — G4700 Insomnia, unspecified: Secondary | ICD-10-CM | POA: Diagnosis not present

## 2015-10-29 DIAGNOSIS — C7951 Secondary malignant neoplasm of bone: Secondary | ICD-10-CM

## 2015-10-29 DIAGNOSIS — C50912 Malignant neoplasm of unspecified site of left female breast: Secondary | ICD-10-CM | POA: Diagnosis not present

## 2015-10-29 DIAGNOSIS — C50111 Malignant neoplasm of central portion of right female breast: Secondary | ICD-10-CM

## 2015-10-29 DIAGNOSIS — Z5111 Encounter for antineoplastic chemotherapy: Secondary | ICD-10-CM | POA: Diagnosis not present

## 2015-10-29 DIAGNOSIS — C50919 Malignant neoplasm of unspecified site of unspecified female breast: Secondary | ICD-10-CM

## 2015-10-29 DIAGNOSIS — C50512 Malignant neoplasm of lower-outer quadrant of left female breast: Secondary | ICD-10-CM

## 2015-10-29 DIAGNOSIS — C50511 Malignant neoplasm of lower-outer quadrant of right female breast: Secondary | ICD-10-CM

## 2015-10-29 DIAGNOSIS — C50112 Malignant neoplasm of central portion of left female breast: Principal | ICD-10-CM

## 2015-10-29 DIAGNOSIS — C7989 Secondary malignant neoplasm of other specified sites: Secondary | ICD-10-CM

## 2015-10-29 LAB — COMPREHENSIVE METABOLIC PANEL
ALT: 61 U/L — AB (ref 0–55)
ANION GAP: 9 meq/L (ref 3–11)
AST: 71 U/L — ABNORMAL HIGH (ref 5–34)
Albumin: 3.8 g/dL (ref 3.5–5.0)
Alkaline Phosphatase: 78 U/L (ref 40–150)
BILIRUBIN TOTAL: 0.93 mg/dL (ref 0.20–1.20)
BUN: 11.1 mg/dL (ref 7.0–26.0)
CO2: 27 meq/L (ref 22–29)
CREATININE: 0.8 mg/dL (ref 0.6–1.1)
Calcium: 10.1 mg/dL (ref 8.4–10.4)
Chloride: 103 mEq/L (ref 98–109)
EGFR: >90 mL/min/{1.73_m2} (ref >90)
GLUCOSE: 112 mg/dL (ref 70–140)
Potassium: 3.8 mEq/L (ref 3.5–5.1)
Sodium: 139 mEq/L (ref 136–145)
TOTAL PROTEIN: 7.7 g/dL (ref 6.4–8.3)

## 2015-10-29 LAB — CBC WITH DIFFERENTIAL/PLATELET
BASO%: 0.5 % (ref 0.0–2.0)
Basophils Absolute: 0 10*3/uL (ref 0.0–0.1)
EOS%: 3.3 % (ref 0.0–7.0)
Eosinophils Absolute: 0.1 10*3/uL (ref 0.0–0.5)
HCT: 30.1 % — ABNORMAL LOW (ref 34.8–46.6)
HGB: 9.3 g/dL — ABNORMAL LOW (ref 11.6–15.9)
LYMPH#: 1.5 10*3/uL (ref 0.9–3.3)
LYMPH%: 40 % (ref 14.0–49.7)
MCH: 24.1 pg — ABNORMAL LOW (ref 25.1–34.0)
MCHC: 30.7 g/dL — ABNORMAL LOW (ref 31.5–36.0)
MCV: 78.7 fL — ABNORMAL LOW (ref 79.5–101.0)
MONO#: 0.5 10*3/uL (ref 0.1–0.9)
MONO%: 14.3 % — ABNORMAL HIGH (ref 0.0–14.0)
NEUT%: 41.9 % (ref 38.4–76.8)
NEUTROS ABS: 1.5 10*3/uL (ref 1.5–6.5)
PLATELETS: 524 10*3/uL — AB (ref 145–400)
RBC: 3.83 10*6/uL (ref 3.70–5.45)
RDW: 18.8 % — AB (ref 11.2–14.5)
WBC: 3.6 10*3/uL — AB (ref 3.9–10.3)

## 2015-10-29 MED ORDER — SODIUM CHLORIDE 0.9 % IV SOLN
Freq: Once | INTRAVENOUS | Status: AC
  Administered 2015-10-29: 10:00:00 via INTRAVENOUS

## 2015-10-29 MED ORDER — SODIUM CHLORIDE 0.9 % IV SOLN
800.0000 mg/m2 | Freq: Once | INTRAVENOUS | Status: AC
  Administered 2015-10-29: 1368 mg via INTRAVENOUS
  Filled 2015-10-29: qty 35.98

## 2015-10-29 MED ORDER — PROCHLORPERAZINE MALEATE 10 MG PO TABS
ORAL_TABLET | ORAL | Status: AC
  Filled 2015-10-29: qty 1

## 2015-10-29 MED ORDER — SODIUM CHLORIDE 0.9% FLUSH
10.0000 mL | INTRAVENOUS | Status: DC | PRN
  Administered 2015-10-29: 10 mL
  Filled 2015-10-29: qty 10

## 2015-10-29 MED ORDER — HEPARIN SOD (PORK) LOCK FLUSH 100 UNIT/ML IV SOLN
500.0000 [IU] | Freq: Once | INTRAVENOUS | Status: AC | PRN
  Administered 2015-10-29: 500 [IU]
  Filled 2015-10-29: qty 5

## 2015-10-29 MED ORDER — PROCHLORPERAZINE MALEATE 10 MG PO TABS
10.0000 mg | ORAL_TABLET | Freq: Once | ORAL | Status: AC
  Administered 2015-10-29: 10 mg via ORAL

## 2015-10-29 NOTE — Patient Instructions (Addendum)
[facility] Discharge Instructions for Patients Receiving Chemotherapy  Today you received the following chemotherapy agents GEMZAR  To help prevent nausea and vomiting after your treatment, we encourage you to take your nausea medication if needed.   If you develop nausea and vomiting that is not controlled by your nausea medication, call the clinic.   BELOW ARE SYMPTOMS THAT SHOULD BE REPORTED IMMEDIATELY:  *FEVER GREATER THAN 100.5 F  *CHILLS WITH OR WITHOUT FEVER  NAUSEA AND VOMITING THAT IS NOT CONTROLLED WITH YOUR NAUSEA MEDICATION  *UNUSUAL SHORTNESS OF BREATH  *UNUSUAL BRUISING OR BLEEDING  TENDERNESS IN MOUTH AND THROAT WITH OR WITHOUT PRESENCE OF ULCERS  *URINARY PROBLEMS  *BOWEL PROBLEMS  UNUSUAL RASH Items with * indicate a potential emergency and should be followed up as soon as possible.  Feel free to call the clinic you have any questions or concerns. The clinic phone number is [phone].  Please show the [place] at check-in to the Emergency Department and triage nurse.   INSTRUCTED PT TO USE WARM MOIST COMPRESSES ON RIGHT UPPER CHEST AT SPOT WHERE IR USED TO OBTAIN VENOUS ACCESS DURING PORT PLACEMENT. THIS IS VIA CYNDEE BACON NP/ KEVIN PA IN IR AND THE MD IN IR TODAY. NO ANTIBIOTICS NEEDED AT THIS TIME.

## 2015-10-29 NOTE — Telephone Encounter (Signed)
Pt left coat in infusion room. Attempted x2 to call on cell after checking in lobby/valet area. Unable to leave message on cell. Left voice mail in home phone.

## 2015-11-01 ENCOUNTER — Encounter: Payer: Self-pay | Admitting: Nurse Practitioner

## 2015-11-01 DIAGNOSIS — G4700 Insomnia, unspecified: Secondary | ICD-10-CM | POA: Insufficient documentation

## 2015-11-01 NOTE — Assessment & Plan Note (Signed)
Patient continues to complain of insomnia.  She has tried lorazepam, Restoril, melatonin, etc.  Dr. Lindi Adie prescribed trazodone for the patient to try for a total of 1 week 2 days ago.  Patient states that it is only helping somewhat.  Reviewed patient's complaint with Dr. Lindi Adie; and he suggested that patient continue trying the trazodone for the rest of this next week.  Patient was willing to continue with the trazodone for the time being.

## 2015-11-01 NOTE — Assessment & Plan Note (Signed)
Patient presents to the [facility] today to receive cycle 3, day 8 of her gemcitabine chemotherapy regimen.  Labs obtained today were all within normal limits.  Patient is scheduled to return on 11/12/2015 for labs, visit, and Xgeva injection, and her next cycle of chemotherapy.

## 2015-11-01 NOTE — Progress Notes (Signed)
SYMPTOM MANAGEMENT CLINIC    Chief Complaint: Insomnia   HPI:  Paula Navarro 65 y.o. female diagnosed with bilateral breast cancer; both lung and bone metastasis.  Currently undergoing gemcitabine chemotherapy regimen.  Patient reports continued issues with insomnia; despite initiating trazodone therapy at night within the past 2 days.  She is requesting further consultation regarding her insomnia.  She denies any other new symptoms.     Bilateral breast cancer ([place])   07/23/2013 Mammogram Bilateral breast masses. 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 N1 M0 stage IIB ER 100%, PR 0%, HER-2    06/17/2014 -  Radiation Therapy Adjuvant radiation therapy   09/14/2014 -  Anti-estrogen oral therapy Anastrozole 1 mg daily   05/28/2015 Imaging CT scans: Enlarging subpectoral masses 3.1 x 3.5 cm, posteriorly lower density mass 4.5 x 2.1 cm, several right-sided lung nodules right lower lobe 1.4 cm, 3 other right lung nodules, 1.7 cm right iliac bone lesion   06/21/2015  Procedure Left subpectoral mass biopsy: Invasive high-grade ductal carcinoma ER 5%, PR 0%, HER-2 negative ratio 1.29   09/01/2015 Imaging Left chest wall mass increased in size 7.2 x 5.1 cm, multiple subcutaneous nodules, increase in the lung nodules both in number as well as in the size of existing nodules   09/10/2015 -  Chemotherapy Carboplatin, gemcitabine days 1 and 8 q 3 weeks    Review of Systems  All other systems reviewed and are negative.   Past Medical History  Diagnosis Date  . Chronic bronchitis   . Palpitation     Tachycardia reported by monitor clerk during a symptomatic spell  . Chest pain     Admitted to [facility] in 09/2011; refused stress test  . Atrial septal defect 1996    Surgical repair in 1996  . Tobacco abuse     60 pack years; 1.5 packs per day  . Anxiety   . Anemia   . Breast cancer ([place])   . Wears dentures     top  . COPD (chronic obstructive pulmonary disease) ([place])     on xray  . Chronic pain   . Radiation 06/30/14-08/17/14    Bilateral Breast    Past Surgical History  Procedure Laterality Date  . Cholecystectomy    . Cesarean section      x3  . Tubal ligation    . Asd repair, ostium primum  1996    dr Roxy Horseman  . Port a cath revision  1/15    put in   . Breast  lumpectomy with radioactive seed localization Bilateral 04/08/2014    Procedure: BILATERAL  RADIOACTIVE SEED LOCALIZATION LUMPECTOMY ;  Surgeon: Autumn Messing III, MD;  Location: [facility];  Service: General;  Laterality: Bilateral;  . Axillary lymph node dissection Bilateral 04/08/2014    Procedure:  BILATERAL AXILLARY LYMPH NODE DISSECTION;  Surgeon: Autumn Messing III, MD;  Location: [facility];  Service: General;  Laterality: Bilateral;  . Open heart surgery    . 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    is allergic to codeine and morphine and related.    Medication List       This list is accurate as of: 10/29/15 11:59 PM.  Always use your most recent med list.               albuterol 108 (90 Base) MCG/ACT inhaler  Commonly known as:  PROVENTIL HFA;VENTOLIN HFA  Inhale 1-2 puffs into the lungs every 6 (six) hours as needed for wheezing or shortness of breath.     calcium-vitamin D 500-400 MG-UNIT tablet  Commonly known as:  OSCAL-500  Take 1 tablet by mouth 2 (two) times daily.     docusate sodium 100 MG capsule  Commonly known as:  COLACE  Take 1 capsule (100 mg total) by mouth every 12 (twelve) hours.     gabapentin 300 MG capsule  Commonly known as:  NEURONTIN  TAKE 2 CAPSULES (600 MG TOTAL) BY MOUTH 3 (THREE) TIMES DAILY.     lidocaine-prilocaine cream  Commonly known as:  EMLA  Apply to affected area once     LORazepam 1 MG tablet  Commonly known as:  ATIVAN  Take 1 mg by mouth at bedtime as needed for anxiety or sleep.     magnesium citrate Soln  Take 296 mLs (1 Bottle total) by mouth once.     ondansetron 8 MG tablet  Commonly known as:  ZOFRAN  Take 1 tablet (8 mg total) by mouth 2 (two) times daily as needed for refractory nausea / vomiting. Start on day 3 after carboplatin chemo.     oxyCODONE 10 mg 12 hr tablet  Commonly known as:  OXYCONTIN  Take 1 tablet (10 mg total) by mouth every 12 (twelve) hours.     oxyCODONE-acetaminophen 5-325 MG tablet  Commonly known as:  PERCOCET/ROXICET  1 tabs PO q6h prn pain     prochlorperazine 10 MG tablet  Commonly known as:  COMPAZINE  Take 1 tablet (10 mg total) by mouth every 6 (six) hours as  needed (Nausea or vomiting).     SUPER B COMPLEX PO  Take 1 tablet by mouth daily.     temazepam 30 MG capsule  Commonly known as:  RESTORIL  Take 1 capsule (30 mg total) by mouth at bedtime as needed for sleep.     traZODone 50 MG tablet  Commonly known as:  DESYREL  Take 1 tablet (50 mg total) by mouth at bedtime.         PHYSICAL EXAMINATION  Oncology Vitals 10/29/2015 10/22/2015  Height - 158 cm  Weight - 63.322 kg  Weight (lbs) - 139 lbs 10 oz  BMI (kg/m2) - 25.53 kg/m2  Temp 98.9 98  Pulse 77 82  Resp 18 18  SpO2 100 100  BSA (m2) - 1.66 m2   BP Readings from Last 2 Encounters:  10/29/15 138/76  10/22/15 138/73    Physical Exam  Constitutional: She is oriented to person, place, and time and well-developed, well-nourished, and in no distress.  HENT:  Head: Normocephalic and atraumatic.  Eyes: Conjunctivae and EOM are normal. Pupils are equal, round, and reactive to light.  Pulmonary/Chest: Effort normal. No respiratory distress.  Musculoskeletal: Normal range of motion.  Neurological: She is alert and oriented to person, place, and time.  Skin: Skin is warm and dry.  Psychiatric: Affect normal.    LABORATORY DATA:. Appointment on 10/29/2015  Component Date Value Ref Range Status  . WBC 10/29/2015 3.6* 3.9 - 10.3 10e3/uL Final  . NEUT# 10/29/2015 1.5  1.5 - 6.5 10e3/uL Final  . HGB 10/29/2015 9.3* 11.6 - 15.9 g/dL Final  . HCT 10/29/2015 30.1* 34.8 - 46.6 % Final  . Platelets 10/29/2015 524* 145 - 400 10e3/uL Final  . MCV 10/29/2015 78.7* 79.5 - 101.0 fL Final  . MCH 10/29/2015 24.1* 25.1 - 34.0 pg Final  . MCHC 10/29/2015 30.7* 31.5 - 36.0 g/dL Final  . RBC 10/29/2015 3.83  3.70 - 5.45 10e6/uL Final  . RDW 10/29/2015 18.8* 11.2 - 14.5 % Final  . lymph# 10/29/2015 1.5  0.9 - 3.3 10e3/uL Final  . MONO# 10/29/2015 0.5  0.1 - 0.9 10e3/uL Final  . Eosinophils Absolute 10/29/2015 0.1  0.0 - 0.5 10e3/uL Final  . Basophils Absolute 10/29/2015 0.0  0.0 - 0.1  10e3/uL Final  . NEUT% 10/29/2015 41.9  38.4 - 76.8 % Final  . LYMPH% 10/29/2015 40.0  14.0 - 49.7 % Final  . MONO% 10/29/2015 14.3* 0.0 - 14.0 % Final  . EOS% 10/29/2015 3.3  0.0 - 7.0 % Final  . BASO% 10/29/2015 0.5  0.0 - 2.0 % Final  . Sodium 10/29/2015 139  136 - 145 mEq/L Final  . Potassium 10/29/2015 3.8  3.5 - 5.1 mEq/L Final  . Chloride 10/29/2015 103  98 - 109 mEq/L Final  . CO2 10/29/2015 27  22 - 29 mEq/L Final  . Glucose 10/29/2015 112  70 - 140 mg/dl Final   Glucose reference range is for nonfasting patients. Fasting glucose reference range is 70- 100.  Marland Kitchen BUN 10/29/2015 11.1  7.0 - 26.0 mg/dL Final  . Creatinine 10/29/2015 0.8  0.6 - 1.1 mg/dL Final  . Total Bilirubin 10/29/2015 0.93  0.20 - 1.20 mg/dL Final  . Alkaline Phosphatase 10/29/2015 78  40 - 150 U/L Final  . AST 10/29/2015 71* 5 - 34 U/L Final  . ALT 10/29/2015 61* 0 - 55 U/L Final  . Total Protein 10/29/2015 7.7  6.4 - 8.3 g/dL Final  . Albumin 10/29/2015 3.8  3.5 - 5.0 g/dL Final  . Calcium 10/29/2015 10.1  8.4 - 10.4 mg/dL Final  . Anion Gap 10/29/2015 9  3 - 11 mEq/L Final  . EGFR 10/29/2015 >90  >90 ml/min/1.73 m2 Final   eGFR is calculated using the CKD-EPI Creatinine Equation (2009)    RADIOGRAPHIC STUDIES: No results found.  ASSESSMENT/PLAN:    Bilateral breast cancer [facility]) Patient presents to the [facility] today to receive cycle 3, day 8 of her gemcitabine chemotherapy regimen.  Labs obtained today were all within normal limits.  Patient is scheduled to  return on 11/12/2015 for labs, visit, and Xgeva injection, and her next cycle of chemotherapy.  Insomnia Patient continues to complain of insomnia.  She has tried lorazepam, Restoril, melatonin, etc.  Dr. Lindi Adie prescribed trazodone for the patient to try for a total of 1 week 2 days ago.  Patient states that it is only helping somewhat.  Reviewed patient's complaint with Dr. Lindi Adie; and he suggested that patient continue trying the  trazodone for the rest of this next week.  Patient was willing to continue with the trazodone for the time being.   Patient stated understanding of all instructions; and was in agreement with this plan of care. The patient knows to call the clinic with any problems, questions or concerns.   Review/collaboration with Dr. Lindi Adie regarding all aspects of patient's visit today.   Total time spent with patient was 15 minutes;  with greater than 75 percent of that time spent in face to face counseling regarding patient's symptoms,  and coordination of care and follow up.  Disclaimer:This dictation was prepared with Dragon/digital dictation along with [employer]. Any transcriptional errors that result from this process are unintentional.  Drue Second, NP 11/01/2015

## 2015-11-03 ENCOUNTER — Telehealth: Payer: Self-pay | Admitting: *Deleted

## 2015-11-03 ENCOUNTER — Other Ambulatory Visit: Payer: Self-pay

## 2015-11-03 DIAGNOSIS — C7951 Secondary malignant neoplasm of bone: Secondary | ICD-10-CM

## 2015-11-03 DIAGNOSIS — C50919 Malignant neoplasm of unspecified site of unspecified female breast: Secondary | ICD-10-CM

## 2015-11-03 DIAGNOSIS — C7801 Secondary malignant neoplasm of right lung: Secondary | ICD-10-CM

## 2015-11-03 DIAGNOSIS — C50112 Malignant neoplasm of central portion of left female breast: Principal | ICD-10-CM

## 2015-11-03 DIAGNOSIS — C50111 Malignant neoplasm of central portion of right female breast: Secondary | ICD-10-CM

## 2015-11-03 MED ORDER — ZOLPIDEM TARTRATE 10 MG PO TABS: 10.0000 mg | ORAL_TABLET | Freq: Every evening | ORAL | Status: DC | PRN

## 2015-11-03 NOTE — Progress Notes (Signed)
LMOVM - 7 day supply Amb[facility] called [facility].  Pt to call clinic with any questions.

## 2015-11-03 NOTE — Telephone Encounter (Signed)
"  I need something to put me to sleep not help me sleep.  I have not slept in two and a half months.  I feel like I need to go to the ED.  Not sleeping is making me sick.  Trazodone isn't working.  Restoril didn't work.  There was a 10 mg pill I was prescribed when my husband died that put me to sleep right away.  I have the stress of my house leaking, a $1200 power bill but this shouldn't keep me awake every night." Asked if the sleeping pill was Ambien and she thinks this is correct.  Uses [facility] in [place], [place]. Return number [phone].

## 2015-11-08 ENCOUNTER — Other Ambulatory Visit: Payer: Self-pay | Admitting: Hematology and Oncology

## 2015-11-09 ENCOUNTER — Ambulatory Visit: Payer: Medicare Other

## 2015-11-09 ENCOUNTER — Other Ambulatory Visit: Payer: Medicare Other

## 2015-11-10 ENCOUNTER — Telehealth: Payer: Self-pay

## 2015-11-10 NOTE — Telephone Encounter (Signed)
Patient called requesting a refill on her Lorrin Mais stating that the medication really helps her to sleep and she can not fall asleep without it.  Patient was also wondering if she should continue on the chemotherapy she is taking.  She states that she is in a lot of pain and she has no feeling in her feet despite being on the gabapentin.  She would like RN to call her to discuss canceling chemotherapy at the end of the week.  Patient aware that Dr. Lindi Adie is away.

## 2015-11-11 ENCOUNTER — Telehealth: Payer: Self-pay

## 2015-11-11 NOTE — Telephone Encounter (Signed)
Patient called again today regarding the request for ambien.  Patient see's Nira Conn, NP tomorrow morning and was encouraged to ask her for the medication since Dr. Lindi Adie is out of town.

## 2015-11-11 NOTE — Telephone Encounter (Signed)
Patient called this morning requesting new prescription for wig, states she lost hers. New prescription filled out by Iris Pert, RN and given to patient. Patient very appreciative and understands to call with any further questions or concerns.

## 2015-11-12 ENCOUNTER — Ambulatory Visit: Payer: Medicare Other

## 2015-11-12 ENCOUNTER — Other Ambulatory Visit: Payer: Self-pay | Admitting: Nurse Practitioner

## 2015-11-12 ENCOUNTER — Telehealth: Payer: Self-pay | Admitting: *Deleted

## 2015-11-12 ENCOUNTER — Ambulatory Visit: Payer: Medicare Other | Admitting: Nurse Practitioner

## 2015-11-12 ENCOUNTER — Other Ambulatory Visit: Payer: Medicare Other

## 2015-11-12 ENCOUNTER — Other Ambulatory Visit: Payer: Self-pay | Admitting: *Deleted

## 2015-11-12 MED ORDER — ZOLPIDEM TARTRATE 10 MG PO TABS: 10.0000 mg | ORAL_TABLET | Freq: Every evening | ORAL | Status: DC | PRN

## 2015-11-12 NOTE — Telephone Encounter (Signed)
TC from pt asking about her ambien prescription.  Informed pt that is was called in to her Pharmacy this afternoon and should be ready for pick up.  Pt. Voiced understanding.

## 2015-11-12 NOTE — Telephone Encounter (Signed)
TC from patient this am requesting refill on her Ambien. Last refill per med list was for 7 tablets on 11/03/15. Pt states they do help her sleep. She has not had any sleep for the last 2 nights and apparently has cancelled her appts for today because she has not slept.

## 2015-11-12 NOTE — Telephone Encounter (Signed)
Patient called and left message to cancel her appts for today. I have canceled apapts and forwarded the message to the MD and desk RN.

## 2015-11-12 NOTE — Telephone Encounter (Signed)
Called pt to let her know Rx for Ambien has been refilled. No answer was unable to leave a message for pt to p/u medication.

## 2015-11-16 ENCOUNTER — Other Ambulatory Visit: Payer: Self-pay

## 2015-11-16 ENCOUNTER — Telehealth: Payer: Self-pay | Admitting: *Deleted

## 2015-11-16 ENCOUNTER — Ambulatory Visit: Payer: Medicare Other

## 2015-11-16 ENCOUNTER — Other Ambulatory Visit: Payer: Medicare Other

## 2015-11-16 DIAGNOSIS — C50512 Malignant neoplasm of lower-outer quadrant of left female breast: Principal | ICD-10-CM

## 2015-11-16 DIAGNOSIS — C50511 Malignant neoplasm of lower-outer quadrant of right female breast: Secondary | ICD-10-CM

## 2015-11-16 MED ORDER — OXYCODONE-ACETAMINOPHEN 5-325 MG PO TABS: ORAL_TABLET | ORAL | Status: DC

## 2015-11-16 NOTE — Telephone Encounter (Signed)
"  I need a refill on the Oxycodone 5-325 mg.  I'll be there Friday for chemotherapy and can pick it up then.  My feet hurt so bad I can hardly walk.  Feet are cracking some at the heels.  I do not put lotion on unless I'm going out.  Can I go to the pool with a relative for physical therapy?"  Will notify provider of this request.  Admits to slight redness to feet.  Advised she avoid long hot showers.  Use bag balm, udder cream or Vaseline at night wearing socks to moisturize her feet, wear well fitted shoes and no excessive walking.

## 2015-11-19 ENCOUNTER — Other Ambulatory Visit (HOSPITAL_BASED_OUTPATIENT_CLINIC_OR_DEPARTMENT_OTHER): Payer: Medicare Other

## 2015-11-19 ENCOUNTER — Other Ambulatory Visit: Payer: Self-pay

## 2015-11-19 ENCOUNTER — Ambulatory Visit (HOSPITAL_BASED_OUTPATIENT_CLINIC_OR_DEPARTMENT_OTHER): Payer: Medicare Other

## 2015-11-19 ENCOUNTER — Ambulatory Visit: Payer: Medicare Other

## 2015-11-19 VITALS — BP 117/75 | HR 67 | Temp 97.9°F | Resp 18

## 2015-11-19 DIAGNOSIS — C7951 Secondary malignant neoplasm of bone: Secondary | ICD-10-CM

## 2015-11-19 DIAGNOSIS — C50912 Malignant neoplasm of unspecified site of left female breast: Secondary | ICD-10-CM | POA: Diagnosis not present

## 2015-11-19 DIAGNOSIS — C50512 Malignant neoplasm of lower-outer quadrant of left female breast: Secondary | ICD-10-CM

## 2015-11-19 DIAGNOSIS — Z95828 Presence of other vascular implants and grafts: Secondary | ICD-10-CM | POA: Insufficient documentation

## 2015-11-19 DIAGNOSIS — C50112 Malignant neoplasm of central portion of left female breast: Principal | ICD-10-CM

## 2015-11-19 DIAGNOSIS — C50911 Malignant neoplasm of unspecified site of right female breast: Secondary | ICD-10-CM

## 2015-11-19 DIAGNOSIS — C50919 Malignant neoplasm of unspecified site of unspecified female breast: Secondary | ICD-10-CM

## 2015-11-19 DIAGNOSIS — C50111 Malignant neoplasm of central portion of right female breast: Secondary | ICD-10-CM

## 2015-11-19 DIAGNOSIS — Z5111 Encounter for antineoplastic chemotherapy: Secondary | ICD-10-CM

## 2015-11-19 DIAGNOSIS — C7801 Secondary malignant neoplasm of right lung: Secondary | ICD-10-CM

## 2015-11-19 DIAGNOSIS — C50511 Malignant neoplasm of lower-outer quadrant of right female breast: Secondary | ICD-10-CM

## 2015-11-19 LAB — COMPREHENSIVE METABOLIC PANEL
ALK PHOS: 66 U/L (ref 40–150)
ALT: 42 U/L (ref 0–55)
ANION GAP: 10 meq/L (ref 3–11)
AST: 49 U/L — AB (ref 5–34)
Albumin: 3.6 g/dL (ref 3.5–5.0)
BUN: 16.4 mg/dL (ref 7.0–26.0)
CALCIUM: 9.1 mg/dL (ref 8.4–10.4)
CO2: 21 mEq/L — ABNORMAL LOW (ref 22–29)
Chloride: 108 mEq/L (ref 98–109)
Creatinine: 0.8 mg/dL (ref 0.6–1.1)
Glucose: 137 mg/dl (ref 70–140)
POTASSIUM: 4 meq/L (ref 3.5–5.1)
Sodium: 140 mEq/L (ref 136–145)
Total Bilirubin: 0.47 mg/dL (ref 0.20–1.20)
Total Protein: 7.3 g/dL (ref 6.4–8.3)

## 2015-11-19 LAB — CBC WITH DIFFERENTIAL/PLATELET
BASO%: 0.8 % (ref 0.0–2.0)
BASOS ABS: 0.1 10*3/uL (ref 0.0–0.1)
EOS ABS: 0.4 10*3/uL (ref 0.0–0.5)
EOS%: 5.4 % (ref 0.0–7.0)
HEMATOCRIT: 31.4 % — AB (ref 34.8–46.6)
HEMOGLOBIN: 9.8 g/dL — AB (ref 11.6–15.9)
LYMPH#: 2.3 10*3/uL (ref 0.9–3.3)
LYMPH%: 35.7 % (ref 14.0–49.7)
MCH: 24.1 pg — AB (ref 25.1–34.0)
MCHC: 31.2 g/dL — ABNORMAL LOW (ref 31.5–36.0)
MCV: 77.3 fL — AB (ref 79.5–101.0)
MONO#: 0.9 10*3/uL (ref 0.1–0.9)
MONO%: 13.6 % (ref 0.0–14.0)
NEUT#: 2.9 10*3/uL (ref 1.5–6.5)
NEUT%: 44.5 % (ref 38.4–76.8)
PLATELETS: 521 10*3/uL — AB (ref 145–400)
RBC: 4.06 10*6/uL (ref 3.70–5.45)
RDW: 17.3 % — ABNORMAL HIGH (ref 11.2–14.5)
WBC: 6.5 10*3/uL (ref 3.9–10.3)
nRBC: 0 % (ref 0–0)

## 2015-11-19 MED ORDER — HEPARIN SOD (PORK) LOCK FLUSH 100 UNIT/ML IV SOLN
500.0000 [IU] | Freq: Once | INTRAVENOUS | Status: AC | PRN
  Administered 2015-11-19: 500 [IU]
  Filled 2015-11-19: qty 5

## 2015-11-19 MED ORDER — SODIUM CHLORIDE 0.9 % IV SOLN
488.0000 mg | Freq: Once | INTRAVENOUS | Status: AC
  Administered 2015-11-19: 490 mg via INTRAVENOUS
  Filled 2015-11-19: qty 49

## 2015-11-19 MED ORDER — ZOLPIDEM TARTRATE 10 MG PO TABS: 10.0000 mg | ORAL_TABLET | Freq: Every evening | ORAL | Status: DC | PRN

## 2015-11-19 MED ORDER — SODIUM CHLORIDE 0.9 % IV SOLN
Freq: Once | INTRAVENOUS | Status: AC
  Administered 2015-11-19: 10:00:00 via INTRAVENOUS

## 2015-11-19 MED ORDER — SODIUM CHLORIDE 0.9 % IV SOLN
10.0000 mg | Freq: Once | INTRAVENOUS | Status: AC
  Administered 2015-11-19: 10 mg via INTRAVENOUS
  Filled 2015-11-19: qty 1

## 2015-11-19 MED ORDER — ALTEPLASE 2 MG IJ SOLR
2.0000 mg | Freq: Once | INTRAMUSCULAR | Status: DC | PRN
  Filled 2015-11-19: qty 2

## 2015-11-19 MED ORDER — SODIUM CHLORIDE 0.9 % IV SOLN
800.0000 mg/m2 | Freq: Once | INTRAVENOUS | Status: AC
  Administered 2015-11-19: 1368 mg via INTRAVENOUS
  Filled 2015-11-19: qty 35.98

## 2015-11-19 MED ORDER — SODIUM CHLORIDE 0.9 % IJ SOLN
10.0000 mL | INTRAMUSCULAR | Status: DC | PRN
  Administered 2015-11-19: 10 mL via INTRAVENOUS
  Filled 2015-11-19: qty 10

## 2015-11-19 MED ORDER — SODIUM CHLORIDE 0.9% FLUSH
10.0000 mL | INTRAVENOUS | Status: DC | PRN
  Filled 2015-11-19: qty 10

## 2015-11-19 MED ORDER — HEPARIN SOD (PORK) LOCK FLUSH 100 UNIT/ML IV SOLN
500.0000 [IU] | Freq: Once | INTRAVENOUS | Status: DC | PRN
  Filled 2015-11-19: qty 5

## 2015-11-19 MED ORDER — PALONOSETRON HCL INJECTION 0.25 MG/5ML
0.2500 mg | Freq: Once | INTRAVENOUS | Status: AC
  Administered 2015-11-19: 0.25 mg via INTRAVENOUS

## 2015-11-19 MED ORDER — PALONOSETRON HCL INJECTION 0.25 MG/5ML
INTRAVENOUS | Status: AC
  Filled 2015-11-19: qty 5

## 2015-11-19 MED ORDER — DENOSUMAB 120 MG/1.7ML ~~LOC~~ SOLN
120.0000 mg | Freq: Once | SUBCUTANEOUS | Status: AC
  Administered 2015-11-19: 120 mg via SUBCUTANEOUS
  Filled 2015-11-19: qty 1.7

## 2015-11-19 MED FILL — OXYCODONE/APAP 5/325MG: 5-325 | 15 days supply | Qty: 60 | Fill #0

## 2015-11-19 NOTE — Patient Instructions (Addendum)
Gemcitabine injection What is this medicine? GEMCITABINE (jem SIT a been) is a chemotherapy drug. This medicine is used to treat many types of cancer like breast cancer, lung cancer, pancreatic cancer, and ovarian cancer. This medicine may be used for other purposes; ask your health care provider or pharmacist if you have questions. What should I tell my health care provider before I take this medicine? They need to know if you have any of these conditions: -blood disorders -infection -kidney disease -liver disease -recent or ongoing radiation therapy -an unusual or allergic reaction to gemcitabine, other chemotherapy, other medicines, foods, dyes, or preservatives -pregnant or trying to get pregnant -breast-feeding How should I use this medicine? This drug is given as an infusion into a vein. It is administered in a hospital or clinic by a specially trained health care professional. Talk to your pediatrician regarding the use of this medicine in children. Special care may be needed. Overdosage: If you think you have taken too much of this medicine contact a poison control center or emergency room at once. NOTE: This medicine is only for you. Do not share this medicine with others. What if I miss a dose? It is important not to miss your dose. Call your doctor or health care professional if you are unable to keep an appointment. What may interact with this medicine? -medicines to increase blood counts like filgrastim, pegfilgrastim, sargramostim -some other chemotherapy drugs like cisplatin -vaccines Talk to your doctor or health care professional before taking any of these medicines: -acetaminophen -aspirin -ibuprofen -ketoprofen -naproxen This list may not describe all possible interactions. Give your health care provider a list of all the medicines, herbs, non-prescription drugs, or dietary supplements you use. Also tell them if you smoke, drink alcohol, or use illegal drugs. Some  items may interact with your medicine. What should I watch for while using this medicine? Visit your doctor for checks on your progress. This drug may make you feel generally unwell. This is not uncommon, as chemotherapy can affect healthy cells as well as cancer cells. Report any side effects. Continue your course of treatment even though you feel ill unless your doctor tells you to stop. In some cases, you may be given additional medicines to help with side effects. Follow all directions for their use. Call your doctor or health care professional for advice if you get a fever, chills or sore throat, or other symptoms of a cold or flu. Do not treat yourself. This drug decreases your body's ability to fight infections. Try to avoid being around people who are sick. This medicine may increase your risk to bruise or bleed. Call your doctor or health care professional if you notice any unusual bleeding. Be careful brushing and flossing your teeth or using a toothpick because you may get an infection or bleed more easily. If you have any dental work done, tell your dentist you are receiving this medicine. Avoid taking products that contain aspirin, acetaminophen, ibuprofen, naproxen, or ketoprofen unless instructed by your doctor. These medicines may hide a fever. Women should inform their doctor if they wish to become pregnant or think they might be pregnant. There is a potential for serious side effects to an unborn child. Talk to your health care professional or pharmacist for more information. Do not breast-feed an infant while taking this medicine. What side effects may I notice from receiving this medicine? Side effects that you should report to your doctor or health care professional as soon as possible: -allergic  reactions like skin rash, itching or hives, swelling of the face, lips, or tongue -low blood counts - this medicine may decrease the number of white blood cells, red blood cells and  platelets. You may be at increased risk for infections and bleeding. -signs of infection - fever or chills, cough, sore throat, pain or difficulty passing urine -signs of decreased platelets or bleeding - bruising, pinpoint red spots on the skin, black, tarry stools, blood in the urine -signs of decreased red blood cells - unusually weak or tired, fainting spells, lightheadedness -breathing problems -chest pain -mouth sores -nausea and vomiting -pain, swelling, redness at site where injected -pain, tingling, numbness in the hands or feet -stomach pain -swelling of ankles, feet, hands -unusual bleeding Side effects that usually do not require medical attention (report to your doctor or health care professional if they continue or are bothersome): -constipation -diarrhea -hair loss -loss of appetite -stomach upset This list may not describe all possible side effects. Call your doctor for medical advice about side effects. You may report side effects to FDA at 1-800-FDA-1088. Where should I keep my medicine? This drug is given in a hospital or clinic and will not be stored at home. NOTE: This sheet is a summary. It may not cover all possible information. If you have questions about this medicine, talk to your doctor, pharmacist, or health care provider.    2016, Elsevier/Gold Standard. (2007-11-26 18:45:54) Carboplatin injection What is this medicine? CARBOPLATIN (KAR boe pla tin) is a chemotherapy drug. It targets fast dividing cells, like cancer cells, and causes these cells to die. This medicine is used to treat ovarian cancer and many other cancers. This medicine may be used for other purposes; ask your health care provider or pharmacist if you have questions. What should I tell my health care provider before I take this medicine? They need to know if you have any of these conditions: -blood disorders -hearing problems -kidney disease -recent or ongoing radiation therapy -an  unusual or allergic reaction to carboplatin, cisplatin, other chemotherapy, other medicines, foods, dyes, or preservatives -pregnant or trying to get pregnant -breast-feeding How should I use this medicine? This drug is usually given as an infusion into a vein. It is administered in a hospital or clinic by a specially trained health care professional. Talk to your pediatrician regarding the use of this medicine in children. Special care may be needed. Overdosage: If you think you have taken too much of this medicine contact a poison control center or emergency room at once. NOTE: This medicine is only for you. Do not share this medicine with others. What if I miss a dose? It is important not to miss a dose. Call your doctor or health care professional if you are unable to keep an appointment. What may interact with this medicine? -medicines for seizures -medicines to increase blood counts like filgrastim, pegfilgrastim, sargramostim -some antibiotics like amikacin, gentamicin, neomycin, streptomycin, tobramycin -vaccines Talk to your doctor or health care professional before taking any of these medicines: -acetaminophen -aspirin -ibuprofen -ketoprofen -naproxen This list may not describe all possible interactions. Give your health care provider a list of all the medicines, herbs, non-prescription drugs, or dietary supplements you use. Also tell them if you smoke, drink alcohol, or use illegal drugs. Some items may interact with your medicine. What should I watch for while using this medicine? Your condition will be monitored carefully while you are receiving this medicine. You will need important blood work done while you  are taking this medicine. This drug may make you feel generally unwell. This is not uncommon, as chemotherapy can affect healthy cells as well as cancer cells. Report any side effects. Continue your course of treatment even though you feel ill unless your doctor tells you to  stop. In some cases, you may be given additional medicines to help with side effects. Follow all directions for their use. Call your doctor or health care professional for advice if you get a fever, chills or sore throat, or other symptoms of a cold or flu. Do not treat yourself. This drug decreases your body's ability to fight infections. Try to avoid being around people who are sick. This medicine may increase your risk to bruise or bleed. Call your doctor or health care professional if you notice any unusual bleeding. Be careful brushing and flossing your teeth or using a toothpick because you may get an infection or bleed more easily. If you have any dental work done, tell your dentist you are receiving this medicine. Avoid taking products that contain aspirin, acetaminophen, ibuprofen, naproxen, or ketoprofen unless instructed by your doctor. These medicines may hide a fever. Do not become pregnant while taking this medicine. Women should inform their doctor if they wish to become pregnant or think they might be pregnant. There is a potential for serious side effects to an unborn child. Talk to your health care professional or pharmacist for more information. Do not breast-feed an infant while taking this medicine. What side effects may I notice from receiving this medicine? Side effects that you should report to your doctor or health care professional as soon as possible: -allergic reactions like skin rash, itching or hives, swelling of the face, lips, or tongue -signs of infection - fever or chills, cough, sore throat, pain or difficulty passing urine -signs of decreased platelets or bleeding - bruising, pinpoint red spots on the skin, black, tarry stools, nosebleeds -signs of decreased red blood cells - unusually weak or tired, fainting spells, lightheadedness -breathing problems -changes in hearing -changes in vision -chest pain -high blood pressure -low blood counts - This drug may  decrease the number of white blood cells, red blood cells and platelets. You may be at increased risk for infections and bleeding. -nausea and vomiting -pain, swelling, redness or irritation at the injection site -pain, tingling, numbness in the hands or feet -problems with balance, talking, walking -trouble passing urine or change in the amount of urine Side effects that usually do not require medical attention (report to your doctor or health care professional if they continue or are bothersome): -hair loss -loss of appetite -metallic taste in the mouth or changes in taste This list may not describe all possible side effects. Call your doctor for medical advice about side effects. You may report side effects to FDA at 1-800-FDA-1088. Where should I keep my medicine? This drug is given in a hospital or clinic and will not be stored at home. NOTE: This sheet is a summary. It may not cover all possible information. If you have questions about this medicine, talk to your doctor, pharmacist, or health care provider.    2016, Elsevier/Gold Standard. (2007-10-22 14:38:05)   Denosumab injection What is this medicine? DENOSUMAB (den oh sue mab) slows bone breakdown. Prolia is used to treat osteoporosis in women after menopause and in men. Delton See is used to prevent bone fractures and other bone problems caused by cancer bone metastases. Delton See is also used to treat giant cell tumor of  the bone. This medicine may be used for other purposes; ask your health care provider or pharmacist if you have questions. What should I tell my health care provider before I take this medicine? They need to know if you have any of these conditions: -dental disease -eczema -infection or history of infections -kidney disease or on dialysis -low blood calcium or vitamin D -malabsorption syndrome -scheduled to have surgery or tooth extraction -taking medicine that contains denosumab -thyroid or parathyroid  disease -an unusual reaction to denosumab, other medicines, foods, dyes, or preservatives -pregnant or trying to get pregnant -breast-feeding How should I use this medicine? This medicine is for injection under the skin. It is given by a health care professional in a hospital or clinic setting. If you are getting Prolia, a special MedGuide will be given to you by the pharmacist with each prescription and refill. Be sure to read this information carefully each time. For Prolia, talk to your pediatrician regarding the use of this medicine in children. Special care may be needed. For Delton See, talk to your pediatrician regarding the use of this medicine in children. While this drug may be prescribed for children as young as 13 years for selected conditions, precautions do apply. Overdosage: If you think you have taken too much of this medicine contact a poison control center or emergency room at once. NOTE: This medicine is only for you. Do not share this medicine with others. What if I miss a dose? It is important not to miss your dose. Call your doctor or health care professional if you are unable to keep an appointment. What may interact with this medicine? Do not take this medicine with any of the following medications: -other medicines containing denosumab This medicine may also interact with the following medications: -medicines that suppress the immune system -medicines that treat cancer -steroid medicines like prednisone or cortisone This list may not describe all possible interactions. Give your health care provider a list of all the medicines, herbs, non-prescription drugs, or dietary supplements you use. Also tell them if you smoke, drink alcohol, or use illegal drugs. Some items may interact with your medicine. What should I watch for while using this medicine? Visit your doctor or health care professional for regular checks on your progress. Your doctor or health care professional may  order blood tests and other tests to see how you are doing. Call your doctor or health care professional if you get a cold or other infection while receiving this medicine. Do not treat yourself. This medicine may decrease your body's ability to fight infection. You should make sure you get enough calcium and vitamin D while you are taking this medicine, unless your doctor tells you not to. Discuss the foods you eat and the vitamins you take with your health care professional. See your dentist regularly. Brush and floss your teeth as directed. Before you have any dental work done, tell your dentist you are receiving this medicine. Do not become pregnant while taking this medicine or for 5 months after stopping it. Women should inform their doctor if they wish to become pregnant or think they might be pregnant. There is a potential for serious side effects to an unborn child. Talk to your health care professional or pharmacist for more information. What side effects may I notice from receiving this medicine? Side effects that you should report to your doctor or health care professional as soon as possible: -allergic reactions like skin rash, itching or hives, swelling of  the face, lips, or tongue -breathing problems -chest pain -fast, irregular heartbeat -feeling faint or lightheaded, falls -fever, chills, or any other sign of infection -muscle spasms, tightening, or twitches -numbness or tingling -skin blisters or bumps, or is dry, peels, or red -slow healing or unexplained pain in the mouth or jaw -unusual bleeding or bruising Side effects that usually do not require medical attention (Report these to your doctor or health care professional if they continue or are bothersome.): -muscle pain -stomach upset, gas This list may not describe all possible side effects. Call your doctor for medical advice about side effects. You may report side effects to FDA at 1-800-FDA-1088. Where should I keep my  medicine? This medicine is only given in a clinic, doctor's office, or other health care setting and will not be stored at home. NOTE: This sheet is a summary. It may not cover all possible information. If you have questions about this medicine, talk to your doctor, pharmacist, or health care provider.    2016, Elsevier/Gold Standard. (2012-01-15 12:37:47)

## 2015-11-19 NOTE — Patient Instructions (Signed)

## 2015-11-30 ENCOUNTER — Other Ambulatory Visit: Payer: Self-pay | Admitting: Hematology and Oncology

## 2015-11-30 DIAGNOSIS — C50112 Malignant neoplasm of central portion of left female breast: Principal | ICD-10-CM

## 2015-11-30 DIAGNOSIS — C50111 Malignant neoplasm of central portion of right female breast: Secondary | ICD-10-CM

## 2015-12-02 NOTE — Assessment & Plan Note (Signed)
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 N1 M0 stage IIB  Radiology CT chest abdomen pelvis 05/28/2015: Developing subpectoral masses 3.5 cm and 4.5 cm. Four lung nodules the largest 1.4 cm in the right lung 1.7 cm sclerotic right iliac bone lesion concerning for metastatic disease Left subpectoral mass biopsy 06/21/2015: Invasive high-grade ductal carcinoma ER 5%, PR 0%, HER-2 negative ratio 1.29 Prior treatment: Xeloda 1500 mg by mouth twice a day started 07/07/2015 stopped 09/03/2015 (stopped for progression)  Bone metastases: Xgeva every 6 weeks with Calcium + D started January 2017 CT chest abdomen pelvis 09/01/15: Progressive left chest wall recurrence with an enlarging mass and severa; new sub cutaneous nodules. Progressive lung nodules -------------------------------------------------------------------------------------------------------------------------- Treatment plan: Carboplatin day 1 and gemcitabine days 1 and 8 every 3 weeks with Neulasta started 09/10/2015; today is cycle 4 day 8  Chemotherapy toxicities: 1. Fatigue 2. Anemia due to chemotherapy: hgb improved mildly to 9.0 3. Severe leukocytosis with neutrophils markedly increased due to Neulasta: Neulasta discontinued with subsequent chemotherapy treatments.  Clinically the left chest wall mass continues to shrink.   Left chest wall mass redness and pain: CT chest did not show any sign of infection Pain issues: Oxycontin for long acting pain medication. Pain well controlled Insomnia: D/Ced Ativan and started restoril. Advised to try melatonin 32m as well Lymphedema:  referral placed to physical therapy for the lymphedema clinic. Continue to wear compression sleeves daily.  Return to clinic with cycle 5 of chemotherapy

## 2015-12-03 ENCOUNTER — Ambulatory Visit: Payer: Medicare Other

## 2015-12-03 ENCOUNTER — Ambulatory Visit (HOSPITAL_BASED_OUTPATIENT_CLINIC_OR_DEPARTMENT_OTHER): Payer: Medicare Other | Admitting: Hematology and Oncology

## 2015-12-03 ENCOUNTER — Encounter: Payer: Self-pay | Admitting: Hematology and Oncology

## 2015-12-03 ENCOUNTER — Ambulatory Visit (HOSPITAL_BASED_OUTPATIENT_CLINIC_OR_DEPARTMENT_OTHER): Payer: Medicare Other

## 2015-12-03 ENCOUNTER — Other Ambulatory Visit (HOSPITAL_BASED_OUTPATIENT_CLINIC_OR_DEPARTMENT_OTHER): Payer: Medicare Other

## 2015-12-03 VITALS — BP 129/70 | HR 80 | Temp 98.3°F | Resp 18 | Ht 62.0 in | Wt 133.5 lb

## 2015-12-03 DIAGNOSIS — Z5111 Encounter for antineoplastic chemotherapy: Secondary | ICD-10-CM

## 2015-12-03 DIAGNOSIS — G4700 Insomnia, unspecified: Secondary | ICD-10-CM

## 2015-12-03 DIAGNOSIS — Z95828 Presence of other vascular implants and grafts: Secondary | ICD-10-CM

## 2015-12-03 DIAGNOSIS — C50111 Malignant neoplasm of central portion of right female breast: Secondary | ICD-10-CM

## 2015-12-03 DIAGNOSIS — C50919 Malignant neoplasm of unspecified site of unspecified female breast: Secondary | ICD-10-CM

## 2015-12-03 DIAGNOSIS — T451X5A Adverse effect of antineoplastic and immunosuppressive drugs, initial encounter: Secondary | ICD-10-CM

## 2015-12-03 DIAGNOSIS — C50911 Malignant neoplasm of unspecified site of right female breast: Secondary | ICD-10-CM | POA: Diagnosis not present

## 2015-12-03 DIAGNOSIS — C50912 Malignant neoplasm of unspecified site of left female breast: Secondary | ICD-10-CM | POA: Diagnosis not present

## 2015-12-03 DIAGNOSIS — C7951 Secondary malignant neoplasm of bone: Secondary | ICD-10-CM

## 2015-12-03 DIAGNOSIS — C7989 Secondary malignant neoplasm of other specified sites: Secondary | ICD-10-CM

## 2015-12-03 DIAGNOSIS — C50512 Malignant neoplasm of lower-outer quadrant of left female breast: Secondary | ICD-10-CM

## 2015-12-03 DIAGNOSIS — C50511 Malignant neoplasm of lower-outer quadrant of right female breast: Secondary | ICD-10-CM

## 2015-12-03 DIAGNOSIS — G620 Drug-induced polyneuropathy: Secondary | ICD-10-CM

## 2015-12-03 DIAGNOSIS — C50112 Malignant neoplasm of central portion of left female breast: Principal | ICD-10-CM

## 2015-12-03 DIAGNOSIS — R0789 Other chest pain: Secondary | ICD-10-CM

## 2015-12-03 LAB — COMPREHENSIVE METABOLIC PANEL
ALK PHOS: 58 U/L (ref 40–150)
ALT: 49 U/L (ref 0–55)
ANION GAP: 11 meq/L (ref 3–11)
AST: 55 U/L — AB (ref 5–34)
Albumin: 3.8 g/dL (ref 3.5–5.0)
BILIRUBIN TOTAL: 0.54 mg/dL (ref 0.20–1.20)
BUN: 12.7 mg/dL (ref 7.0–26.0)
CALCIUM: 9.6 mg/dL (ref 8.4–10.4)
CO2: 23 mEq/L (ref 22–29)
Chloride: 106 mEq/L (ref 98–109)
Creatinine: 0.8 mg/dL (ref 0.6–1.1)
EGFR: 90 mL/min/{1.73_m2} — ABNORMAL LOW (ref >90)
GLUCOSE: 126 mg/dL (ref 70–140)
POTASSIUM: 4 meq/L (ref 3.5–5.1)
Sodium: 140 mEq/L (ref 136–145)
TOTAL PROTEIN: 7.4 g/dL (ref 6.4–8.3)

## 2015-12-03 LAB — CBC WITH DIFFERENTIAL/PLATELET
BASO%: 0.4 % (ref 0.0–2.0)
Basophils Absolute: 0 10*3/uL (ref 0.0–0.1)
EOS%: 1.1 % (ref 0.0–7.0)
Eosinophils Absolute: 0.1 10*3/uL (ref 0.0–0.5)
HEMATOCRIT: 33.6 % — AB (ref 34.8–46.6)
HGB: 10.3 g/dL — ABNORMAL LOW (ref 11.6–15.9)
LYMPH#: 1.7 10*3/uL (ref 0.9–3.3)
LYMPH%: 16.2 % (ref 14.0–49.7)
MCH: 23.7 pg — ABNORMAL LOW (ref 25.1–34.0)
MCHC: 30.5 g/dL — AB (ref 31.5–36.0)
MCV: 77.6 fL — ABNORMAL LOW (ref 79.5–101.0)
MONO#: 0.9 10*3/uL (ref 0.1–0.9)
MONO%: 8.6 % (ref 0.0–14.0)
NEUT%: 73.7 % (ref 38.4–76.8)
NEUTROS ABS: 7.6 10*3/uL — AB (ref 1.5–6.5)
PLATELETS: 125 10*3/uL — AB (ref 145–400)
RBC: 4.33 10*6/uL (ref 3.70–5.45)
RDW: 18.3 % — AB (ref 11.2–14.5)
WBC: 10.3 10*3/uL (ref 3.9–10.3)

## 2015-12-03 MED ORDER — PROCHLORPERAZINE MALEATE 10 MG PO TABS
ORAL_TABLET | ORAL | Status: AC
  Filled 2015-12-03: qty 1

## 2015-12-03 MED ORDER — SODIUM CHLORIDE 0.9% FLUSH
10.0000 mL | INTRAVENOUS | Status: DC | PRN
  Administered 2015-12-03: 10 mL
  Filled 2015-12-03: qty 10

## 2015-12-03 MED ORDER — HEPARIN SOD (PORK) LOCK FLUSH 100 UNIT/ML IV SOLN
500.0000 [IU] | Freq: Once | INTRAVENOUS | Status: AC | PRN
  Administered 2015-12-03: 500 [IU]
  Filled 2015-12-03: qty 5

## 2015-12-03 MED ORDER — GEMCITABINE HCL CHEMO INJECTION 1 GM/26.3ML
800.0000 mg/m2 | Freq: Once | INTRAVENOUS | Status: AC
  Administered 2015-12-03: 1368 mg via INTRAVENOUS
  Filled 2015-12-03: qty 35.98

## 2015-12-03 MED ORDER — SODIUM CHLORIDE 0.9 % IV SOLN
Freq: Once | INTRAVENOUS | Status: AC
  Administered 2015-12-03: 10:00:00 via INTRAVENOUS

## 2015-12-03 MED ORDER — PROCHLORPERAZINE MALEATE 10 MG PO TABS
10.0000 mg | ORAL_TABLET | Freq: Once | ORAL | Status: AC
  Administered 2015-12-03: 10 mg via ORAL

## 2015-12-03 MED ORDER — SODIUM CHLORIDE 0.9 % IJ SOLN
10.0000 mL | INTRAMUSCULAR | Status: DC | PRN
  Administered 2015-12-03: 10 mL via INTRAVENOUS
  Filled 2015-12-03: qty 10

## 2015-12-03 NOTE — Progress Notes (Signed)
Patient Care Team: [name], MD as PCP - General (Family Medicine) [name], MD (Cardiology)  DIAGNOSIS: Bilateral breast cancer [facility])   Staging form: Breast, AJCC 7th Edition     Clinical: Stage IIB (T2, N1, cM0) - Unsigned       Staging comments: Staged at breast conference 08/13/13      Pathologic: No stage assigned - Unsigned   SUMMARY OF ONCOLOGIC HISTORY:   Bilateral breast cancer ([place])   07/23/2013 Mammogram Bilateral breast masses. 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 N1 M0 stage IIB ER 100%, PR 0%, HER-2    06/17/2014 -  Radiation Therapy Adjuvant radiation therapy   09/14/2014 -  Anti-estrogen oral therapy Anastrozole 1 mg daily   05/28/2015 Imaging CT scans: Enlarging subpectoral masses 3.1 x 3.5 cm, posteriorly lower density mass 4.5 x 2.1 cm, several right-sided lung nodules right lower lobe 1.4 cm, 3 other right lung nodules, 1.7 cm right iliac bone lesion   06/21/2015 Procedure Left subpectoral mass biopsy: Invasive high-grade  ductal carcinoma ER 5%, PR 0%, HER-2 negative ratio 1.29   09/01/2015 Imaging Left chest wall mass increased in size 7.2 x 5.1 cm, multiple subcutaneous nodules, increase in the lung nodules both in number as well as in the size of existing nodules   09/10/2015 -  Chemotherapy Carboplatin, gemcitabine days 1 and 8 q 3 weeks    CHIEF COMPLIANT: Cycle 4 day 8 carboplatin and gemcitabine  INTERVAL HISTORY: Paula Navarro is a 65 year old with above-mentioned history of metastatic breast cancer currently on carboplatin and gemcitabine chemotherapy. Today is cycle 4 day 8 of treatment. Her major complaints in the past having insomnia and chest wall pain. The chest wall pain is under stable condition with narcotic pain medications. Insomnia has always been a challenge. We have tried multiple medications including Ambien, Ativan, trazodone most recently. Patient is complaining of neuropathy  REVIEW OF SYSTEMS:   Constitutional: Denies fevers, chills or abnormal weight loss Eyes: Denies blurriness of vision Ears, nose, mouth, throat, and face: Denies mucositis or sore throat Respiratory: Denies cough, dyspnea or wheezes Cardiovascular: Denies palpitation, chest discomfort Gastrointestinal:  Denies nausea, heartburn or change in bowel habits Skin: Denies abnormal skin rashes Lymphatics: Denies new lymphadenopathy or easy bruising Neurological: Neuropathy in hands and feet sometimes worse Behavioral/Psych: Mood is stable, no new changes  Extremities: No lower extremity edema Breast: Left chest wall mass is smaller All other systems were reviewed with the patient and are negative.  I have reviewed the past medical  history, past surgical history, social history and family history with the patient and they are unchanged from previous note.  ALLERGIES:  is allergic to codeine and morphine and related.  MEDICATIONS:  Current Outpatient Prescriptions  Medication Sig Dispense Refill  . B Complex-C (SUPER B  COMPLEX PO) Take 1 tablet by mouth daily.    . calcium-vitamin D (OSCAL-500) 500-400 MG-UNIT tablet Take 1 tablet by mouth 2 (two) times daily. 60 tablet 3  . docusate sodium (COLACE) 100 MG capsule Take 1 capsule (100 mg total) by mouth every 12 (twelve) hours. 60 capsule 0  . gabapentin (NEURONTIN) 300 MG capsule TAKE 2 CAPSULES (600 MG TOTAL) BY MOUTH 3 (THREE) TIMES DAILY. 180 capsule 5  . lidocaine-prilocaine (EMLA) cream Apply to affected area once (Patient taking differently: Apply 1 application topically as needed. Apply to affected area once) 30 g 3  . ondansetron (ZOFRAN) 8 MG tablet Take 1 tablet (8 mg total) by mouth 2 (two) times daily as needed for refractory nausea / vomiting. Start on day 3 after carboplatin chemo. 30 tablet 1  . oxyCODONE (OXYCONTIN) 10 mg 12 hr tablet Take 1 tablet (10 mg total) by mouth every 12 (twelve) hours. 60 tablet 0  . oxyCODONE-acetaminophen (PERCOCET/ROXICET) 5-325 MG tablet 1 tabs PO q6h prn pain 60 tablet 0  . prochlorperazine (COMPAZINE) 10 MG tablet Take 1 tablet (10 mg total) by mouth every 6 (six) hours as needed (Nausea or vomiting). (Patient not taking: Reported on 10/22/2015) 30 tablet 1  . traZODone (DESYREL) 50 MG tablet Take 1 tablet (50 mg total) by mouth at bedtime. 7 tablet 0  . zolpidem (AMBIEN) 10 MG tablet Take 1 tablet (10 mg total) by mouth at bedtime as needed for sleep. 30 tablet 2   No current facility-administered medications for this visit.   Facility-Administered Medications Ordered in Other Visits  Medication Dose Route Frequency Provider Last Rate Last Dose  . sodium chloride 0.9 % injection 10 mL  10 mL Intravenous PRN Nicholas Lose, MD   10 mL at 12/03/15 0842    PHYSICAL EXAMINATION: ECOG PERFORMANCE STATUS: 1 - Symptomatic but completely ambulatory  Filed Vitals:   12/03/15 0905  BP: 129/70  Pulse: 80  Temp: 98.3 F (36.8 C)  Resp: 18   Filed Weights   12/03/15 0905  Weight: 133 lb 8 oz (60.555 kg)     GENERAL:alert, no distress and comfortable SKIN: skin color, texture, turgor are normal, no rashes or significant lesions EYES: normal, Conjunctiva are pink and non-injected, sclera clear OROPHARYNX:no exudate, no erythema and lips, buccal mucosa, and tongue normal  NECK: supple, thyroid normal size, non-tender, without nodularity LYMPH:  no palpable lymphadenopathy in the cervical, axillary or inguinal LUNGS: clear to auscultation and percussion with normal breathing effort HEART: regular rate & rhythm and no murmurs and no lower extremity edema ABDOMEN:abdomen soft, non-tender and normal bowel sounds MUSCULOSKELETAL:no cyanosis of digits and no clubbing  NEURO: alert & oriented x 3 with fluent speech, grade 2 neuropathy EXTREMITIES: No lower extremity edema BREAST: Left chest wall mass is much smaller. No palpable axillary supraclavicular or infraclavicular adenopathy no breast tenderness or nipple discharge. (exam performed in the presence of a chaperone)  LABORATORY DATA:  I have reviewed the data as listed   Chemistry      Component Value Date/Time   NA 140 12/03/2015 0827   NA 135 09/20/2015 2209   K 4.0 12/03/2015 0827   K 4.2 09/20/2015 2209   CL  99* 09/20/2015 2209   CO2 23 12/03/2015 0827   CO2 26 09/20/2015 2209   BUN 12.7 12/03/2015 0827   BUN 11 09/20/2015 2209   CREATININE 0.8 12/03/2015 0827   CREATININE 0.65 09/20/2015 2209      Component Value Date/Time   CALCIUM 9.6 12/03/2015 0827   CALCIUM 8.7* 09/20/2015 2209   ALKPHOS 58 12/03/2015 0827   AST 55* 12/03/2015 0827   ALT 49 12/03/2015 0827   BILITOT 0.54 12/03/2015 0827       Lab Results  Component Value Date   WBC 10.3 12/03/2015   HGB 10.3* 12/03/2015   HCT 33.6* 12/03/2015   MCV 77.6* 12/03/2015   PLT 125* 12/03/2015   NEUTROABS 7.6* 12/03/2015     ASSESSMENT & PLAN:  Bilateral breast cancer ([place]) Treated with neoadjuvant chemotherapy followed by surgery 04/08/2014, radiation  treatment completed 07/17/2014, antiestrogen therapy with anastrozole 09/14/2014 Left breast T2, N0, M0, IDC grade 3; 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 N1 M0 stage IIB  Radiology CT chest abdomen pelvis 05/28/2015: Developing subpectoral masses 3.5 cm and 4.5 cm. Four lung nodules the largest 1.4 cm in the right lung 1.7 cm sclerotic right iliac bone lesion concerning for metastatic disease Left subpectoral mass biopsy 06/21/2015: Invasive high-grade ductal carcinoma ER 5%, PR 0%, HER-2 negative ratio 1.29 Prior treatment: Xeloda 1500 mg by mouth twice a day started 07/07/2015 stopped 09/03/2015 (stopped for progression)  Bone metastases: Xgeva every 6 weeks with Calcium + D started January 2017 CT chest abdomen pelvis 09/01/15: Progressive left chest wall recurrence with an enlarging mass and severa; new sub cutaneous nodules. Progressive lung nodules -------------------------------------------------------------------------------------------------------------------------- Treatment plan: Carboplatin day 1 and gemcitabine days 1 and 8 every 3 weeks started 09/10/2015; today is cycle 4 day 8 (carboplatin discontinued for neuropathy)  Chemotherapy toxicities: 1. Fatigue 2. Anemia due to chemotherapy: hgb improved mildly to 9.0 3. 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 She will only be on gemcitabine days 1 and 8 every 3 weeks Clinically the left chest wall mass continues to shrink.   Left chest wall mass redness and pain: CT chest did not show any sign of infection Pain issues: Oxycontin for long acting pain medication. Pain well controlled Insomnia: D/Ced Ativan and started restoril. Advised to try melatonin 23m as  well Lymphedema: physical therapy. Continue to wear compression sleeves daily.  Return to clinic with cycle 5 of chemotherapy   No orders of the defined types were placed in this encounter.   The patient has a good understanding of the overall plan. she agrees with it. she will call with any problems that may develop before the next visit here.   GRulon Eisenmenger MD 12/03/2015

## 2015-12-03 NOTE — Patient Instructions (Signed)

## 2015-12-03 NOTE — Patient Instructions (Signed)
Gemcitabine injection What is this medicine? GEMCITABINE (jem SIT a been) is a chemotherapy drug. This medicine is used to treat many types of cancer like breast cancer, lung cancer, pancreatic cancer, and ovarian cancer. This medicine may be used for other purposes; ask your health care provider or pharmacist if you have questions. What should I tell my health care provider before I take this medicine? They need to know if you have any of these conditions: -blood disorders -infection -kidney disease -liver disease -recent or ongoing radiation therapy -an unusual or allergic reaction to gemcitabine, other chemotherapy, other medicines, foods, dyes, or preservatives -pregnant or trying to get pregnant -breast-feeding How should I use this medicine? This drug is given as an infusion into a vein. It is administered in a hospital or clinic by a specially trained health care professional. Talk to your pediatrician regarding the use of this medicine in children. Special care may be needed. Overdosage: If you think you have taken too much of this medicine contact a poison control center or emergency room at once. NOTE: This medicine is only for you. Do not share this medicine with others. What if I miss a dose? It is important not to miss your dose. Call your doctor or health care professional if you are unable to keep an appointment. What may interact with this medicine? -medicines to increase blood counts like filgrastim, pegfilgrastim, sargramostim -some other chemotherapy drugs like cisplatin -vaccines Talk to your doctor or health care professional before taking any of these medicines: -acetaminophen -aspirin -ibuprofen -ketoprofen -naproxen This list may not describe all possible interactions. Give your health care provider a list of all the medicines, herbs, non-prescription drugs, or dietary supplements you use. Also tell them if you smoke, drink alcohol, or use illegal drugs. Some  items may interact with your medicine. What should I watch for while using this medicine? Visit your doctor for checks on your progress. This drug may make you feel generally unwell. This is not uncommon, as chemotherapy can affect healthy cells as well as cancer cells. Report any side effects. Continue your course of treatment even though you feel ill unless your doctor tells you to stop. In some cases, you may be given additional medicines to help with side effects. Follow all directions for their use. Call your doctor or health care professional for advice if you get a fever, chills or sore throat, or other symptoms of a cold or flu. Do not treat yourself. This drug decreases your body's ability to fight infections. Try to avoid being around people who are sick. This medicine may increase your risk to bruise or bleed. Call your doctor or health care professional if you notice any unusual bleeding. Be careful brushing and flossing your teeth or using a toothpick because you may get an infection or bleed more easily. If you have any dental work done, tell your dentist you are receiving this medicine. Avoid taking products that contain aspirin, acetaminophen, ibuprofen, naproxen, or ketoprofen unless instructed by your doctor. These medicines may hide a fever. Women should inform their doctor if they wish to become pregnant or think they might be pregnant. There is a potential for serious side effects to an unborn child. Talk to your health care professional or pharmacist for more information. Do not breast-feed an infant while taking this medicine. What side effects may I notice from receiving this medicine? Side effects that you should report to your doctor or health care professional as soon as possible: -allergic  reactions like skin rash, itching or hives, swelling of the face, lips, or tongue -low blood counts - this medicine may decrease the number of white blood cells, red blood cells and  platelets. You may be at increased risk for infections and bleeding. -signs of infection - fever or chills, cough, sore throat, pain or difficulty passing urine -signs of decreased platelets or bleeding - bruising, pinpoint red spots on the skin, black, tarry stools, blood in the urine -signs of decreased red blood cells - unusually weak or tired, fainting spells, lightheadedness -breathing problems -chest pain -mouth sores -nausea and vomiting -pain, swelling, redness at site where injected -pain, tingling, numbness in the hands or feet -stomach pain -swelling of ankles, feet, hands -unusual bleeding Side effects that usually do not require medical attention (report to your doctor or health care professional if they continue or are bothersome): -constipation -diarrhea -hair loss -loss of appetite -stomach upset This list may not describe all possible side effects. Call your doctor for medical advice about side effects. You may report side effects to FDA at 1-800-FDA-1088. Where should I keep my medicine? This drug is given in a hospital or clinic and will not be stored at home. NOTE: This sheet is a summary. It may not cover all possible information. If you have questions about this medicine, talk to your doctor, pharmacist, or health care provider.    2016, [employer]. (2007-11-26 18:45:54) Carboplatin injection What is this medicine? CARBOPLATIN (KAR boe pla tin) is a chemotherapy drug. It targets fast dividing cells, like cancer cells, and causes these cells to die. This medicine is used to treat ovarian cancer and many other cancers. This medicine may be used for other purposes; ask your health care provider or pharmacist if you have questions. What should I tell my health care provider before I take this medicine? They need to know if you have any of these conditions: -blood disorders -hearing problems -kidney disease -recent or ongoing radiation therapy -an  unusual or allergic reaction to carboplatin, cisplatin, other chemotherapy, other medicines, foods, dyes, or preservatives -pregnant or trying to get pregnant -breast-feeding How should I use this medicine? This drug is usually given as an infusion into a vein. It is administered in a hospital or clinic by a specially trained health care professional. Talk to your pediatrician regarding the use of this medicine in children. Special care may be needed. Overdosage: If you think you have taken too much of this medicine contact a poison control center or emergency room at once. NOTE: This medicine is only for you. Do not share this medicine with others. What if I miss a dose? It is important not to miss a dose. Call your doctor or health care professional if you are unable to keep an appointment. What may interact with this medicine? -medicines for seizures -medicines to increase blood counts like filgrastim, pegfilgrastim, sargramostim -some antibiotics like amikacin, gentamicin, neomycin, streptomycin, tobramycin -vaccines Talk to your doctor or health care professional before taking any of these medicines: -acetaminophen -aspirin -ibuprofen -ketoprofen -naproxen This list may not describe all possible interactions. Give your health care provider a list of all the medicines, herbs, non-prescription drugs, or dietary supplements you use. Also tell them if you smoke, drink alcohol, or use illegal drugs. Some items may interact with your medicine. What should I watch for while using this medicine? Your condition will be monitored carefully while you are receiving this medicine. You will need important blood work done while you  are taking this medicine. This drug may make you feel generally unwell. This is not uncommon, as chemotherapy can affect healthy cells as well as cancer cells. Report any side effects. Continue your course of treatment even though you feel ill unless your doctor tells you to  stop. In some cases, you may be given additional medicines to help with side effects. Follow all directions for their use. Call your doctor or health care professional for advice if you get a fever, chills or sore throat, or other symptoms of a cold or flu. Do not treat yourself. This drug decreases your body's ability to fight infections. Try to avoid being around people who are sick. This medicine may increase your risk to bruise or bleed. Call your doctor or health care professional if you notice any unusual bleeding. Be careful brushing and flossing your teeth or using a toothpick because you may get an infection or bleed more easily. If you have any dental work done, tell your dentist you are receiving this medicine. Avoid taking products that contain aspirin, acetaminophen, ibuprofen, naproxen, or ketoprofen unless instructed by your doctor. These medicines may hide a fever. Do not become pregnant while taking this medicine. Women should inform their doctor if they wish to become pregnant or think they might be pregnant. There is a potential for serious side effects to an unborn child. Talk to your health care professional or pharmacist for more information. Do not breast-feed an infant while taking this medicine. What side effects may I notice from receiving this medicine? Side effects that you should report to your doctor or health care professional as soon as possible: -allergic reactions like skin rash, itching or hives, swelling of the face, lips, or tongue -signs of infection - fever or chills, cough, sore throat, pain or difficulty passing urine -signs of decreased platelets or bleeding - bruising, pinpoint red spots on the skin, black, tarry stools, nosebleeds -signs of decreased red blood cells - unusually weak or tired, fainting spells, lightheadedness -breathing problems -changes in hearing -changes in vision -chest pain -high blood pressure -low blood counts - This drug may  decrease the number of white blood cells, red blood cells and platelets. You may be at increased risk for infections and bleeding. -nausea and vomiting -pain, swelling, redness or irritation at the injection site -pain, tingling, numbness in the hands or feet -problems with balance, talking, walking -trouble passing urine or change in the amount of urine Side effects that usually do not require medical attention (report to your doctor or health care professional if they continue or are bothersome): -hair loss -loss of appetite -metallic taste in the mouth or changes in taste This list may not describe all possible side effects. Call your doctor for medical advice about side effects. You may report side effects to FDA at 1-800-FDA-1088. Where should I keep my medicine? This drug is given in a hospital or clinic and will not be stored at home. NOTE: This sheet is a summary. It may not cover all possible information. If you have questions about this medicine, talk to your doctor, pharmacist, or health care provider.    2016, [employer] Standard. (2007-10-22 14:38:05)   Denosumab injection What is this medicine? DENOSUMAB (den oh sue mab) slows bone breakdown. Prolia is used to treat osteoporosis in women after menopause and in men. Delton See is used to prevent bone fractures and other bone problems caused by cancer bone metastases. Delton See is also used to treat giant cell tumor of  the bone. This medicine may be used for other purposes; ask your health care provider or pharmacist if you have questions. What should I tell my health care provider before I take this medicine? They need to know if you have any of these conditions: -dental disease -eczema -infection or history of infections -kidney disease or on dialysis -low blood calcium or vitamin D -malabsorption syndrome -scheduled to have surgery or tooth extraction -taking medicine that contains denosumab -thyroid or parathyroid  disease -an unusual reaction to denosumab, other medicines, foods, dyes, or preservatives -pregnant or trying to get pregnant -breast-feeding How should I use this medicine? This medicine is for injection under the skin. It is given by a health care professional in a hospital or clinic setting. If you are getting Prolia, a special MedGuide will be given to you by the pharmacist with each prescription and refill. Be sure to read this information carefully each time. For Prolia, talk to your pediatrician regarding the use of this medicine in children. Special care may be needed. For Delton See, talk to your pediatrician regarding the use of this medicine in children. While this drug may be prescribed for children as young as 13 years for selected conditions, precautions do apply. Overdosage: If you think you have taken too much of this medicine contact a poison control center or emergency room at once. NOTE: This medicine is only for you. Do not share this medicine with others. What if I miss a dose? It is important not to miss your dose. Call your doctor or health care professional if you are unable to keep an appointment. What may interact with this medicine? Do not take this medicine with any of the following medications: -other medicines containing denosumab This medicine may also interact with the following medications: -medicines that suppress the immune system -medicines that treat cancer -steroid medicines like prednisone or cortisone This list may not describe all possible interactions. Give your health care provider a list of all the medicines, herbs, non-prescription drugs, or dietary supplements you use. Also tell them if you smoke, drink alcohol, or use illegal drugs. Some items may interact with your medicine. What should I watch for while using this medicine? Visit your doctor or health care professional for regular checks on your progress. Your doctor or health care professional may  order blood tests and other tests to see how you are doing. Call your doctor or health care professional if you get a cold or other infection while receiving this medicine. Do not treat yourself. This medicine may decrease your body's ability to fight infection. You should make sure you get enough calcium and vitamin D while you are taking this medicine, unless your doctor tells you not to. Discuss the foods you eat and the vitamins you take with your health care professional. See your dentist regularly. Brush and floss your teeth as directed. Before you have any dental work done, tell your dentist you are receiving this medicine. Do not become pregnant while taking this medicine or for 5 months after stopping it. Women should inform their doctor if they wish to become pregnant or think they might be pregnant. There is a potential for serious side effects to an unborn child. Talk to your health care professional or pharmacist for more information. What side effects may I notice from receiving this medicine? Side effects that you should report to your doctor or health care professional as soon as possible: -allergic reactions like skin rash, itching or hives, swelling of  the face, lips, or tongue -breathing problems -chest pain -fast, irregular heartbeat -feeling faint or lightheaded, falls -fever, chills, or any other sign of infection -muscle spasms, tightening, or twitches -numbness or tingling -skin blisters or bumps, or is dry, peels, or red -slow healing or unexplained pain in the mouth or jaw -unusual bleeding or bruising Side effects that usually do not require medical attention (Report these to your doctor or health care professional if they continue or are bothersome.): -muscle pain -stomach upset, gas This list may not describe all possible side effects. Call your doctor for medical advice about side effects. You may report side effects to FDA at 1-800-FDA-1088. Where should I keep my  medicine? This medicine is only given in a clinic, doctor's office, or other health care setting and will not be stored at home. NOTE: This sheet is a summary. It may not cover all possible information. If you have questions about this medicine, talk to your doctor, pharmacist, or health care provider.    2016, [employer] Standard. (2012-01-15 12:37:47)

## 2015-12-07 ENCOUNTER — Telehealth: Payer: Self-pay | Admitting: *Deleted

## 2015-12-07 NOTE — Telephone Encounter (Signed)
Voicemail: "I have a vein on the right side of my neck that's popped up fat, large.  What is causing this."  Called her back to learn "Port was accessed pretty hard Friday.  It was pretty uncomfortable.  I noticed Sunday but the past two days it's really bothered me.  Having to be still when lying down and turning my neck.  It's swollen or juts out less than 1/2 inch.  Right neck and chest are red and my ear is too.  Not pulse noted."  Denies any injury but has spent the day mopping and doing laundry.  Will add on [facility] tomorrow morning for further assessment.  P.O.F. Generated for 9:30 [facility] visit.

## 2015-12-08 ENCOUNTER — Other Ambulatory Visit: Payer: Self-pay | Admitting: Hematology and Oncology

## 2015-12-08 ENCOUNTER — Telehealth: Payer: Self-pay | Admitting: *Deleted

## 2015-12-08 ENCOUNTER — Other Ambulatory Visit: Payer: Self-pay | Admitting: *Deleted

## 2015-12-08 ENCOUNTER — Encounter: Payer: Self-pay | Admitting: Nurse Practitioner

## 2015-12-08 ENCOUNTER — Ambulatory Visit (HOSPITAL_BASED_OUTPATIENT_CLINIC_OR_DEPARTMENT_OTHER): Payer: Medicare Other | Admitting: Nurse Practitioner

## 2015-12-08 ENCOUNTER — Encounter: Payer: Medicare Other | Admitting: Nurse Practitioner

## 2015-12-08 VITALS — BP 127/67 | HR 86 | Temp 98.8°F | Resp 17 | Ht 62.0 in | Wt 133.9 lb

## 2015-12-08 DIAGNOSIS — C50912 Malignant neoplasm of unspecified site of left female breast: Secondary | ICD-10-CM | POA: Diagnosis not present

## 2015-12-08 DIAGNOSIS — C50911 Malignant neoplasm of unspecified site of right female breast: Secondary | ICD-10-CM | POA: Diagnosis not present

## 2015-12-08 DIAGNOSIS — C7800 Secondary malignant neoplasm of unspecified lung: Secondary | ICD-10-CM | POA: Diagnosis not present

## 2015-12-08 DIAGNOSIS — Z452 Encounter for adjustment and management of vascular access device: Secondary | ICD-10-CM | POA: Insufficient documentation

## 2015-12-08 NOTE — Progress Notes (Signed)
SYMPTOM MANAGEMENT CLINIC    Chief Complaint: Port-A-Cath issue  HPI:  Paula Navarro 65 y.o. female diagnosed with bilateral breast cancer with lung metastasis.  Currently undergoing carboplatin/gemcitabine chemotherapy regimen. Patient has a right upper chest Port-A-Cath intact; which was placed per interventional radiology.  She states that she has noticed that it protrudes further from her neck; and has an area of redness above the Port-A-Cath site.  She denies any tenderness to the site.  Chills denies any recent fevers or chills.     Bilateral breast cancer ([place])   07/23/2013 Mammogram Bilateral breast masses. 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 N1 M0 stage IIB ER 100%, PR 0%, HER-2    06/17/2014 -  Radiation Therapy Adjuvant radiation therapy   09/14/2014 -  Anti-estrogen oral therapy Anastrozole 1 mg daily   05/28/2015 Imaging CT scans: Enlarging subpectoral masses 3.1 x 3.5 cm, posteriorly lower density mass 4.5 x 2.1 cm, several right-sided lung nodules  right lower lobe 1.4 cm, 3 other right lung nodules, 1.7 cm right iliac bone lesion   06/21/2015 Procedure Left subpectoral mass biopsy: Invasive high-grade ductal carcinoma ER 5%, PR 0%, HER-2 negative ratio 1.29   09/01/2015 Imaging Left chest wall mass increased in size 7.2 x 5.1 cm, multiple subcutaneous nodules, increase in the lung nodules both in number as well as in the size of existing nodules   09/10/2015 -  Chemotherapy Carboplatin, gemcitabine days 1 and 8 q 3 weeks    Review of Systems  Skin:       Patient is concerned that her Port-A-Cath site has become more pronounced and is mildly red.  All other systems reviewed and are negative.   Past Medical History  Diagnosis Date  . Chronic bronchitis   . Palpitation     Tachycardia reported by monitor clerk during a symptomatic spell  . Chest pain     Admitted to [facility] in 09/2011; refused stress test  . Atrial septal defect 1996    Surgical repair in 1996  . Tobacco abuse     60 pack years; 1.5 packs per day  . Anxiety   . Anemia   . Breast cancer ([place])   . Wears dentures     top  . COPD (chronic obstructive pulmonary disease) ([place])     on xray  . Chronic pain   . Radiation 06/30/14-08/17/14    Bilateral Breast    Past Surgical History  Procedure Laterality Date  . Cholecystectomy    . Cesarean section  x3  . Tubal ligation    . Asd repair, ostium primum  1996    dr Roxy Horseman  . Port a cath revision  1/15    put in   . Breast lumpectomy with radioactive seed localization Bilateral 04/08/2014    Procedure: BILATERAL  RADIOACTIVE SEED LOCALIZATION LUMPECTOMY ;  Surgeon: Autumn Messing III, MD;  Location: [facility];  Service: General;  Laterality: Bilateral;  . Axillary lymph node dissection Bilateral 04/08/2014    Procedure:  BILATERAL AXILLARY LYMPH NODE DISSECTION;  Surgeon: Autumn Messing III, MD;  Location: [facility];  Service: General;  Laterality: Bilateral;  . Open heart surgery    . Breast  biopsy Bilateral     has Chronic bronchitis; Tobacco abuse; Laboratory test; Palpitation; Chest pain; Atrial septal defect; Bilateral breast cancer ([place]); Neuropathy due to chemotherapeutic drug ([place]); Hand foot syndrome; Anxiety; Suspected herpes zoster left C5 distribution; Postherpetic neuralgia; Lymphedema; Rash; Vaginal bleeding; Chronic pain; Metastatic breast cancer ([place]); Encounter for chemotherapy management; Bone metastases ([place]); Lung metastases ([place]); Microcytic anemia; Insomnia; Port catheter in place; and Encounter for central line care on her problem list.    is allergic to codeine and morphine and related.    Medication List       This list is accurate as of: 12/08/15  5:54 PM.  Always use your most recent med list.               calcium-vitamin D 500-400 MG-UNIT tablet  Commonly known as:  OSCAL-500  Take 1 tablet by mouth 2 (two) times daily.     docusate sodium 100 MG capsule  Commonly known as:  COLACE  Take 1 capsule (100 mg total) by mouth every 12 (twelve) hours.     gabapentin 300 MG capsule  Commonly known as:  NEURONTIN  TAKE 2 CAPSULES (600 MG TOTAL) BY MOUTH 3 (THREE) TIMES DAILY.     lidocaine-prilocaine cream  Commonly known as:  EMLA  Apply to affected area once     ondansetron 8 MG tablet  Commonly known as:  ZOFRAN  Take 1 tablet (8 mg total) by mouth 2 (two) times daily as needed for refractory nausea / vomiting. Start on day 3 after carboplatin chemo.     oxyCODONE 10 mg 12 hr tablet  Commonly known as:  OXYCONTIN  Take 1 tablet (10 mg total) by mouth every 12 (twelve) hours.     oxyCODONE-acetaminophen 5-325 MG tablet  Commonly known as:  PERCOCET/ROXICET  1 tabs PO q6h prn pain     prochlorperazine 10 MG tablet  Commonly known as:  COMPAZINE  Take 1 tablet (10 mg total) by mouth every 6 (six) hours as needed (Nausea or vomiting).     SUPER B COMPLEX PO  Take 1 tablet by mouth daily.     traZODone 50 MG tablet  Commonly known as:   DESYREL  Take 1 tablet (50 mg total) by mouth at bedtime.     zolpidem 10 MG tablet  Commonly known as:  AMBIEN  Take 1 tablet (10 mg total) by mouth at bedtime as needed for sleep.         PHYSICAL EXAMINATION  Oncology Vitals 12/08/2015 12/03/2015  Height 158 cm 158 cm  Weight 60.737 kg 60.555 kg  Weight (lbs) 133 lbs 14 oz 133 lbs 8 oz  BMI (kg/m2) 24.49 kg/m2 24.42 kg/m2  Temp 98.8 98.3  Pulse 86 80  Resp 17 18  SpO2 100 100  BSA (m2) 1.63 m2 1.63 m2   BP Readings from Last 2 Encounters:  12/08/15 127/67  12/03/15 129/70    Physical Exam  Constitutional: She is oriented to person, place, and time and well-developed, well-nourished, and in no distress.  HENT:  Head: Normocephalic and atraumatic.  Eyes: Conjunctivae and EOM are normal. Pupils are equal, round, and reactive to light. Right eye exhibits no discharge. Left eye exhibits no discharge. No scleral icterus.  Neck: Normal range of motion.  Pulmonary/Chest: Effort normal. No respiratory distress.  Musculoskeletal: Normal range of motion. She exhibits no edema or tenderness.  Neurological: She is alert and oriented to person, place, and time. Gait normal.  Skin: Skin is warm and dry. No rash noted. There is erythema. No pallor.  Patient's right upper chest Port-A-Cath intact.  There is a trace only of mild erythema directly above the Port-A-Cath site.  Patient admits to rubbing it.  There is no edema, warmth, tenderness, or red streaks to the site.  Psychiatric: Affect normal.  Nursing note and vitals reviewed.   LABORATORY DATA:. No visits with results within 3 Day(s) from this visit. Latest known visit with results is:  Appointment on 12/03/2015  Component Date Value Ref Range Status  . WBC 12/03/2015 10.3  3.9 - 10.3 10e3/uL Final  . NEUT# 12/03/2015 7.6* 1.5 - 6.5 10e3/uL Final  . HGB 12/03/2015 10.3* 11.6 - 15.9 g/dL Final  . HCT 12/03/2015 33.6* 34.8 - 46.6 % Final  . Platelets 12/03/2015 125* 145 -  400 10e3/uL Final  . MCV 12/03/2015 77.6* 79.5 - 101.0 fL Final  . MCH 12/03/2015 23.7* 25.1 - 34.0 pg Final  . MCHC 12/03/2015 30.5* 31.5 - 36.0 g/dL Final  . RBC 12/03/2015 4.33  3.70 - 5.45 10e6/uL Final  . RDW 12/03/2015 18.3* 11.2 - 14.5 % Final  . lymph# 12/03/2015 1.7  0.9 - 3.3 10e3/uL Final  . MONO# 12/03/2015 0.9  0.1 - 0.9 10e3/uL Final  . Eosinophils Absolute 12/03/2015 0.1  0.0 - 0.5 10e3/uL Final  . Basophils Absolute 12/03/2015 0.0  0.0 - 0.1 10e3/uL Final  . NEUT% 12/03/2015 73.7  38.4 - 76.8 % Final  . LYMPH% 12/03/2015 16.2  14.0 - 49.7 % Final  . MONO% 12/03/2015 8.6  0.0 - 14.0 % Final  . EOS% 12/03/2015 1.1  0.0 - 7.0 % Final  . BASO% 12/03/2015 0.4  0.0 - 2.0 % Final  . Sodium 12/03/2015 140  136 - 145 mEq/L Final  . Potassium 12/03/2015 4.0  3.5 - 5.1 mEq/L Final  . Chloride 12/03/2015 106  98 - 109 mEq/L Final  . CO2 12/03/2015 23  22 - 29 mEq/L Final  . Glucose 12/03/2015 126  70 - 140 mg/dl Final   Glucose reference range is for nonfasting patients. Fasting glucose reference range is 70- 100.  Marland Kitchen BUN 12/03/2015 12.7  7.0 - 26.0 mg/dL Final  . Creatinine 12/03/2015 0.8  0.6 - 1.1 mg/dL Final  . Total Bilirubin 12/03/2015 0.54  0.20 - 1.20 mg/dL Final  . Alkaline Phosphatase 12/03/2015 58  40 - 150 U/L Final  . AST 12/03/2015 55* 5 - 34 U/L Final  . ALT 12/03/2015 49  0 - 55 U/L Final  . Total Protein 12/03/2015 7.4  6.4 - 8.3 g/dL Final  . Albumin 12/03/2015 3.8  3.5 - 5.0 g/dL Final  . Calcium 12/03/2015 9.6  8.4 - 10.4 mg/dL Final  . Anion Gap 12/03/2015 11  3 - 11 mEq/L Final  .  EGFR 12/03/2015 90* >90 ml/min/1.73 m2 Final   eGFR is calculated using the CKD-EPI Creatinine Equation (2009)   Right chest PAC:       RADIOGRAPHIC STUDIES: No results found.  ASSESSMENT/PLAN:    Encounter for central line care Patient has a right upper chest Port-A-Cath intact; which was placed per interventional radiology.  She states that she has noticed that it  protrudes further from her neck; and has an area of redness above the Port-A-Cath site.  She denies any tenderness to the site.  Chills denies any recent fevers or chills.  Examination of the right upper chest Port-A-Cath site noted to be intact.  There is no edema, warmth, tenderness, or red streaks.  There is a trace of erythema to the site above the Port-A-Cath were patient confirmed that she has been rubbing.  Patient was advised to call/return or go directly to the emergency department for any worsening symptoms whatsoever.  She was advised to keep an eye on the site and let us know if there are any changes.  Bilateral breast cancer ([place]) Patient received cycle 4, day 8 of her carboplatin/gemcitabine chemotherapy regimen on 12/03/2015.  She is scheduled to return for labs, flush, visit, and chemotherapy on 12/17/2015.   Patient stated understanding of all instructions; and was in agreement with this plan of care. The patient knows to call the clinic with any problems, questions or concerns.   Total time spent with patient was 15 minutes;  with greater than 75 percent of that time spent in face to face counseling regarding patient's symptoms,  and coordination of care and follow up.  Disclaimer:This dictation was prepared with Dragon/digital dictation along with [employer]. Any transcriptional errors that result from this process are unintentional.  Drue Second, NP 12/08/2015

## 2015-12-08 NOTE — Assessment & Plan Note (Signed)
Patient received cycle 4, day 8 of her carboplatin/gemcitabine chemotherapy regimen on 12/03/2015.  She is scheduled to return for labs, flush, visit, and chemotherapy on 12/17/2015.

## 2015-12-08 NOTE — Assessment & Plan Note (Signed)
Patient has a right upper chest Port-A-Cath intact; which was placed per interventional radiology.  She states that she has noticed that it protrudes further from her neck; and has an area of redness above the Port-A-Cath site.  She denies any tenderness to the site.  Chills denies any recent fevers or chills.  Examination of the right upper chest Port-A-Cath site noted to be intact.  There is no edema, warmth, tenderness, or red streaks.  There is a trace of erythema to the site above the Port-A-Cath were patient confirmed that she has been rubbing.  Patient was advised to call/return or go directly to the emergency department for any worsening symptoms whatsoever.  She was advised to keep an eye on the site and let us know if there are any changes.

## 2015-12-08 NOTE — Telephone Encounter (Signed)
Patient called and left message stating "I ned to make an appt, please call me." I have called her back and spoke with her. She stated that "I called yesterday and spoke with Roz, was told I would be called first time this morning. I received no call." I asked with she can come in this afternoon, and she has no transportation. I have forwarded her to the [facility] RN. JMW

## 2015-12-10 ENCOUNTER — Other Ambulatory Visit: Payer: Medicare Other

## 2015-12-10 ENCOUNTER — Ambulatory Visit: Payer: Medicare Other

## 2015-12-17 ENCOUNTER — Ambulatory Visit (HOSPITAL_BASED_OUTPATIENT_CLINIC_OR_DEPARTMENT_OTHER): Payer: Medicare Other | Admitting: Hematology and Oncology

## 2015-12-17 ENCOUNTER — Other Ambulatory Visit (HOSPITAL_BASED_OUTPATIENT_CLINIC_OR_DEPARTMENT_OTHER): Payer: Medicare Other

## 2015-12-17 ENCOUNTER — Telehealth: Payer: Self-pay | Admitting: Hematology and Oncology

## 2015-12-17 ENCOUNTER — Ambulatory Visit: Payer: Medicare Other

## 2015-12-17 ENCOUNTER — Encounter: Payer: Self-pay | Admitting: Hematology and Oncology

## 2015-12-17 ENCOUNTER — Ambulatory Visit (HOSPITAL_BASED_OUTPATIENT_CLINIC_OR_DEPARTMENT_OTHER): Payer: Medicare Other

## 2015-12-17 DIAGNOSIS — Z5111 Encounter for antineoplastic chemotherapy: Secondary | ICD-10-CM

## 2015-12-17 DIAGNOSIS — D0512 Intraductal carcinoma in situ of left breast: Secondary | ICD-10-CM | POA: Diagnosis not present

## 2015-12-17 DIAGNOSIS — C50511 Malignant neoplasm of lower-outer quadrant of right female breast: Secondary | ICD-10-CM

## 2015-12-17 DIAGNOSIS — C50911 Malignant neoplasm of unspecified site of right female breast: Secondary | ICD-10-CM

## 2015-12-17 DIAGNOSIS — C7951 Secondary malignant neoplasm of bone: Secondary | ICD-10-CM

## 2015-12-17 DIAGNOSIS — C50919 Malignant neoplasm of unspecified site of unspecified female breast: Secondary | ICD-10-CM

## 2015-12-17 DIAGNOSIS — C50912 Malignant neoplasm of unspecified site of left female breast: Principal | ICD-10-CM

## 2015-12-17 DIAGNOSIS — D6481 Anemia due to antineoplastic chemotherapy: Secondary | ICD-10-CM

## 2015-12-17 DIAGNOSIS — Z95828 Presence of other vascular implants and grafts: Secondary | ICD-10-CM

## 2015-12-17 DIAGNOSIS — C7989 Secondary malignant neoplasm of other specified sites: Secondary | ICD-10-CM

## 2015-12-17 DIAGNOSIS — C50512 Malignant neoplasm of lower-outer quadrant of left female breast: Secondary | ICD-10-CM

## 2015-12-17 DIAGNOSIS — G620 Drug-induced polyneuropathy: Secondary | ICD-10-CM

## 2015-12-17 LAB — COMPREHENSIVE METABOLIC PANEL
ALT: 60 U/L — ABNORMAL HIGH (ref 0–55)
ANION GAP: 9 meq/L (ref 3–11)
AST: 72 U/L — AB (ref 5–34)
Albumin: 3.8 g/dL (ref 3.5–5.0)
Alkaline Phosphatase: 57 U/L (ref 40–150)
BUN: 13 mg/dL (ref 7.0–26.0)
CALCIUM: 9.8 mg/dL (ref 8.4–10.4)
CHLORIDE: 106 meq/L (ref 98–109)
CO2: 25 mEq/L (ref 22–29)
Creatinine: 0.8 mg/dL (ref 0.6–1.1)
EGFR: >90 mL/min/{1.73_m2} (ref >90)
Glucose: 124 mg/dl (ref 70–140)
POTASSIUM: 4.3 meq/L (ref 3.5–5.1)
Sodium: 140 mEq/L (ref 136–145)
Total Bilirubin: 0.59 mg/dL (ref 0.20–1.20)
Total Protein: 7.6 g/dL (ref 6.4–8.3)

## 2015-12-17 LAB — CBC WITH DIFFERENTIAL/PLATELET
BASO%: 0.7 % (ref 0.0–2.0)
Basophils Absolute: 0 10*3/uL (ref 0.0–0.1)
EOS%: 3.6 % (ref 0.0–7.0)
Eosinophils Absolute: 0.2 10*3/uL (ref 0.0–0.5)
HEMATOCRIT: 35 % (ref 34.8–46.6)
HGB: 11 g/dL — ABNORMAL LOW (ref 11.6–15.9)
LYMPH%: 30.6 % (ref 14.0–49.7)
MCH: 24.3 pg — AB (ref 25.1–34.0)
MCHC: 31.4 g/dL — AB (ref 31.5–36.0)
MCV: 77.4 fL — ABNORMAL LOW (ref 79.5–101.0)
MONO#: 0.6 10*3/uL (ref 0.1–0.9)
MONO%: 10.3 % (ref 0.0–14.0)
NEUT#: 3.4 10*3/uL (ref 1.5–6.5)
NEUT%: 54.8 % (ref 38.4–76.8)
Platelets: 235 10*3/uL (ref 145–400)
RBC: 4.52 10*6/uL (ref 3.70–5.45)
RDW: 18.8 % — ABNORMAL HIGH (ref 11.2–14.5)
WBC: 6.1 10*3/uL (ref 3.9–10.3)
lymph#: 1.9 10*3/uL (ref 0.9–3.3)
nRBC: 0 % (ref 0–0)

## 2015-12-17 MED ORDER — SODIUM CHLORIDE 0.9 % IJ SOLN
10.0000 mL | INTRAMUSCULAR | Status: DC | PRN
  Administered 2015-12-17: 10 mL via INTRAVENOUS
  Filled 2015-12-17: qty 10

## 2015-12-17 MED ORDER — SODIUM CHLORIDE 0.9 % IV SOLN
Freq: Once | INTRAVENOUS | Status: AC
  Administered 2015-12-17: 10:00:00 via INTRAVENOUS

## 2015-12-17 MED ORDER — PROCHLORPERAZINE MALEATE 10 MG PO TABS
10.0000 mg | ORAL_TABLET | Freq: Once | ORAL | Status: AC
  Administered 2015-12-17: 10 mg via ORAL

## 2015-12-17 MED ORDER — PROCHLORPERAZINE MALEATE 10 MG PO TABS
ORAL_TABLET | ORAL | Status: AC
Start: 2015-12-17 — End: 2015-12-17
  Filled 2015-12-17: qty 1

## 2015-12-17 MED ORDER — HEPARIN SOD (PORK) LOCK FLUSH 100 UNIT/ML IV SOLN
500.0000 [IU] | Freq: Once | INTRAVENOUS | Status: AC | PRN
  Administered 2015-12-17: 500 [IU]
  Filled 2015-12-17: qty 5

## 2015-12-17 MED ORDER — SODIUM CHLORIDE 0.9 % IV SOLN
800.0000 mg/m2 | Freq: Once | INTRAVENOUS | Status: AC
  Administered 2015-12-17: 1368 mg via INTRAVENOUS
  Filled 2015-12-17: qty 35.98

## 2015-12-17 MED ORDER — SODIUM CHLORIDE 0.9% FLUSH
10.0000 mL | INTRAVENOUS | Status: DC | PRN
  Administered 2015-12-17: 10 mL
  Filled 2015-12-17: qty 10

## 2015-12-17 NOTE — Progress Notes (Signed)
Patient Care Team: [name], MD as PCP - General (Family Medicine) [name], MD (Cardiology)  DIAGNOSIS: Bilateral breast cancer [facility])   Staging form: Breast, AJCC 7th Edition     Clinical: Stage IIB (T2, N1, cM0) - Unsigned       Staging comments: Staged at breast conference 08/13/13      Pathologic: No stage assigned - Unsigned   SUMMARY OF ONCOLOGIC HISTORY:   Bilateral breast cancer ([place])   07/23/2013 Mammogram Bilateral breast masses. 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 N1 M0 stage IIB ER 100%, PR 0%, HER-2    06/17/2014 -  Radiation Therapy Adjuvant radiation therapy   09/14/2014 -  Anti-estrogen oral therapy Anastrozole 1 mg daily   05/28/2015 Imaging CT scans: Enlarging subpectoral masses 3.1 x 3.5 cm, posteriorly lower density mass 4.5 x 2.1 cm, several right-sided lung nodules right lower lobe 1.4 cm, 3 other right lung nodules, 1.7 cm right iliac bone lesion   06/21/2015 Procedure Left subpectoral mass biopsy: Invasive high-grade  ductal carcinoma ER 5%, PR 0%, HER-2 negative ratio 1.29   09/01/2015 Imaging Left chest wall mass increased in size 7.2 x 5.1 cm, multiple subcutaneous nodules, increase in the lung nodules both in number as well as in the size of existing nodules   09/10/2015 -  Chemotherapy Carboplatin, gemcitabine days 1 and 8 q 3 weeks    CHIEF COMPLIANT: Cycle 5 gemcitabine  INTERVAL HISTORY: Paula Navarro is a 65 year old with above-mentioned history of large left subpectoral mass from metastatic breast cancer currently on chemotherapy with gemcitabine. She is tolerating chemotherapy well. She does not have any nausea or vomiting. She is complaining that the port catheter has looked more prominent than before. Significant softening and improvement of the left pectoral and axillary mass  REVIEW OF SYSTEMS:   Constitutional: Denies fevers, chills or abnormal weight loss Eyes: Denies blurriness of vision Ears, nose, mouth, throat, and face: Denies mucositis or sore throat Respiratory: Denies cough, dyspnea or wheezes Cardiovascular: Denies palpitation, chest discomfort Gastrointestinal:  Denies nausea, heartburn or change in bowel habits Skin: Denies abnormal skin rashes Lymphatics: Denies new lymphadenopathy or easy bruising Neurological pain in the fingers and toes related to neuropathy Behavioral/Psych: Mood is stable, no new changes  Extremities: No lower extremity edema Breast: Left subpectoral masses markedly improved All other systems were reviewed with the patient and are negative.  I have reviewed the past medical history, past surgical history, social history and family history with the patient and they are unchanged  from previous note.  ALLERGIES:  is allergic to codeine and morphine and related.  MEDICATIONS:  Current Outpatient Prescriptions  Medication Sig Dispense Refill  . B Complex-C (SUPER B COMPLEX PO) Take 1 tablet by mouth daily.    . calcium-vitamin D (OSCAL-500) 500-400 MG-UNIT  tablet Take 1 tablet by mouth 2 (two) times daily. 60 tablet 3  . docusate sodium (COLACE) 100 MG capsule Take 1 capsule (100 mg total) by mouth every 12 (twelve) hours. 60 capsule 0  . gabapentin (NEURONTIN) 300 MG capsule TAKE 2 CAPSULES (600 MG TOTAL) BY MOUTH 3 (THREE) TIMES DAILY. 180 capsule 5  . lidocaine-prilocaine (EMLA) cream Apply to affected area once (Patient taking differently: Apply 1 application topically as needed. Apply to affected area once) 30 g 3  . ondansetron (ZOFRAN) 8 MG tablet Take 1 tablet (8 mg total) by mouth 2 (two) times daily as needed for refractory nausea / vomiting. Start on day 3 after carboplatin chemo. 30 tablet 1  . oxyCODONE (OXYCONTIN) 10 mg 12 hr tablet Take 1 tablet (10 mg total) by mouth every 12 (twelve) hours. 60 tablet 0  . oxyCODONE-acetaminophen (PERCOCET/ROXICET) 5-325 MG tablet 1 tabs PO q6h prn pain 60 tablet 0  . prochlorperazine (COMPAZINE) 10 MG tablet Take 1 tablet (10 mg total) by mouth every 6 (six) hours as needed (Nausea or vomiting). (Patient not taking: Reported on 10/22/2015) 30 tablet 1  . traZODone (DESYREL) 50 MG tablet Take 1 tablet (50 mg total) by mouth at bedtime. 7 tablet 0  . zolpidem (AMBIEN) 10 MG tablet Take 1 tablet (10 mg total) by mouth at bedtime as needed for sleep. 30 tablet 2   No current facility-administered medications for this visit.    PHYSICAL EXAMINATION: ECOG PERFORMANCE STATUS: 1 - Symptomatic but completely ambulatory  There were no vitals filed for this visit. There were no vitals filed for this visit.  GENERAL:alert, no distress and comfortable SKIN: skin color, texture, turgor are normal, no rashes or significant lesions EYES: normal, Conjunctiva are pink and non-injected, sclera clear OROPHARYNX:no exudate, no erythema and lips, buccal mucosa, and tongue normal  NECK: supple, thyroid normal size, non-tender, without nodularity LYMPH:  no palpable lymphadenopathy in the cervical, axillary or  inguinal LUNGS: clear to auscultation and percussion with normal breathing effort HEART: regular rate & rhythm and no murmurs and no lower extremity edema ABDOMEN:abdomen soft, non-tender and normal bowel sounds MUSCULOSKELETAL:no cyanosis of digits and no clubbing  NEURO: Neuropathy grade 1-2 causing pain EXTREMITIES: No lower extremity edema BREAST: Significant softening of the left subpectoral mass in the left axilla.   LABORATORY DATA:  I have reviewed the data as listed   Chemistry      Component Value Date/Time   NA 140 12/17/2015 0822   NA 135 09/20/2015 2209   K 4.3 12/17/2015 0822   K 4.2 09/20/2015 2209   CL 99* 09/20/2015 2209   CO2 25 12/17/2015 0822   CO2 26 09/20/2015 2209   BUN 13.0 12/17/2015 0822   BUN 11 09/20/2015 2209   CREATININE 0.8 12/17/2015 0822   CREATININE 0.65 09/20/2015 2209      Component Value Date/Time   CALCIUM 9.8 12/17/2015 0822   CALCIUM 8.7* 09/20/2015 2209   ALKPHOS 57 12/17/2015 0822   AST 72* 12/17/2015 0822   ALT 60* 12/17/2015 0822   BILITOT 0.59 12/17/2015 0822       Lab Results  Component Value Date   WBC 6.1 12/17/2015   HGB  11.0* 12/17/2015   HCT 35.0 12/17/2015   MCV 77.4* 12/17/2015   PLT 235 12/17/2015   NEUTROABS 3.4 12/17/2015     ASSESSMENT & PLAN:  Bilateral breast cancer ([place]) Treated with neoadjuvant chemotherapy followed by surgery 04/08/2014, radiation treatment completed 07/17/2014, antiestrogen therapy with anastrozole 09/14/2014 Left breast T2, N0, M0, IDC grade 3; 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 N1 M0 stage IIB  Radiology CT chest abdomen pelvis 05/28/2015: Developing subpectoral masses 3.5 cm and 4.5 cm. Four lung nodules the largest 1.4 cm in the right lung 1.7 cm sclerotic right iliac bone lesion concerning for metastatic  disease Left subpectoral mass biopsy 06/21/2015: Invasive high-grade ductal carcinoma ER 5%, PR 0%, HER-2 negative ratio 1.29 Prior treatment: Xeloda 1500 mg by mouth twice a day started 07/07/2015 stopped 09/03/2015 (stopped for progression)  Bone metastases: Xgeva every 6 weeks with Calcium + D started January 2017 CT chest abdomen pelvis 09/01/15: Progressive left chest wall recurrence with an enlarging mass and severa; new sub cutaneous nodules. Progressive lung nodules -------------------------------------------------------------------------------------------------------------------------- Treatment plan: Carboplatin day 1 and gemcitabine days 1 and 8 every 3 weeks started 09/10/2015; today is cycle 5 day 1 (carboplatin discontinued for neuropathy)  Chemotherapy toxicities: 1. Fatigue 2. Anemia due to chemotherapy: hgb improved mildly to 9.0 3. 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 She will only be on gemcitabine days 1 and 8 every 3 weeks Clinically the left chest wall mass continues to shrink.   Left chest wall mass redness and pain: CT chest did not show any sign of infection Pain issues: Oxycontin for long acting pain medication. Pain well controlled Insomnia: D/Ced Ativan and started restoril. Advised to try melatonin 10m as well Lymphedema: physical therapy. Continue to wear compression sleeves daily.  Return to clinic with cycle 6 of chemotherapy   No orders of the defined types were placed in this encounter.   The patient has a good understanding of the overall plan. she agrees with it. she will call with any problems that may develop before the next visit here.   GRulon Eisenmenger MD 12/17/2015

## 2015-12-17 NOTE — Assessment & Plan Note (Signed)
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 N1 M0 stage IIB  Radiology CT chest abdomen pelvis 05/28/2015: Developing subpectoral masses 3.5 cm and 4.5 cm. Four lung nodules the largest 1.4 cm in the right lung 1.7 cm sclerotic right iliac bone lesion concerning for metastatic disease Left subpectoral mass biopsy 06/21/2015: Invasive high-grade ductal carcinoma ER 5%, PR 0%, HER-2 negative ratio 1.29 Prior treatment: Xeloda 1500 mg by mouth twice a day started 07/07/2015 stopped 09/03/2015 (stopped for progression)  Bone metastases: Xgeva every 6 weeks with Calcium + D started January 2017 CT chest abdomen pelvis 09/01/15: Progressive left chest wall recurrence with an enlarging mass and severa; new sub cutaneous nodules. Progressive lung nodules -------------------------------------------------------------------------------------------------------------------------- Treatment plan: Carboplatin day 1 and gemcitabine days 1 and 8 every 3 weeks started 09/10/2015; today is cycle 5 day 1 (carboplatin discontinued for neuropathy)  Chemotherapy toxicities: 1. Fatigue 2. Anemia due to chemotherapy: hgb improved mildly to 9.0 3. 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 She will only be on gemcitabine days 1 and 8 every 3 weeks Clinically the left chest wall mass continues to shrink.   Left chest wall mass redness and pain: CT chest did not show any sign of infection Pain issues: Oxycontin for long acting pain medication. Pain well  controlled Insomnia: D/Ced Ativan and started restoril. Advised to try melatonin 60m as well Lymphedema: physical therapy. Continue to wear compression sleeves daily.  Return to clinic with cycle 6 of chemotherapy

## 2015-12-17 NOTE — Patient Instructions (Signed)

## 2015-12-17 NOTE — Patient Instructions (Signed)
[place] [facility] Discharge Instructions for Patients Receiving Chemotherapy  Today you received the following chemotherapy agents GEMZAR  To help prevent nausea and vomiting after your treatment, we encourage you to take your nausea medication if needed.   If you develop nausea and vomiting that is not controlled by your nausea medication, call the clinic.   BELOW ARE SYMPTOMS THAT SHOULD BE REPORTED IMMEDIATELY:  *FEVER GREATER THAN 100.5 F  *CHILLS WITH OR WITHOUT FEVER  NAUSEA AND VOMITING THAT IS NOT CONTROLLED WITH YOUR NAUSEA MEDICATION  *UNUSUAL SHORTNESS OF BREATH  *UNUSUAL BRUISING OR BLEEDING  TENDERNESS IN MOUTH AND THROAT WITH OR WITHOUT PRESENCE OF ULCERS  *URINARY PROBLEMS  *BOWEL PROBLEMS  UNUSUAL RASH Items with * indicate a potential emergency and should be followed up as soon as possible.  Feel free to call the clinic you have any questions or concerns. The clinic phone number is [phone].  Please show the [facility] at check-in to the Emergency Department and triage nurse.   INSTRUCTED PT TO USE WARM MOIST COMPRESSES ON RIGHT UPPER CHEST AT SPOT WHERE IR USED TO OBTAIN VENOUS ACCESS DURING PORT PLACEMENT. THIS IS VIA CYNDEE BACON NP/ KEVIN PA IN IR AND THE MD IN IR TODAY. NO ANTIBIOTICS NEEDED AT THIS TIME.

## 2015-12-17 NOTE — Telephone Encounter (Signed)
appt made and avs printed °

## 2015-12-24 ENCOUNTER — Ambulatory Visit: Payer: Medicare Other

## 2015-12-24 ENCOUNTER — Other Ambulatory Visit: Payer: Medicare Other

## 2015-12-24 ENCOUNTER — Other Ambulatory Visit (HOSPITAL_BASED_OUTPATIENT_CLINIC_OR_DEPARTMENT_OTHER): Payer: Medicare Other

## 2015-12-24 ENCOUNTER — Ambulatory Visit (HOSPITAL_BASED_OUTPATIENT_CLINIC_OR_DEPARTMENT_OTHER): Payer: Medicare Other

## 2015-12-24 ENCOUNTER — Telehealth: Payer: Self-pay | Admitting: Hematology and Oncology

## 2015-12-24 DIAGNOSIS — C50511 Malignant neoplasm of lower-outer quadrant of right female breast: Secondary | ICD-10-CM

## 2015-12-24 DIAGNOSIS — C7951 Secondary malignant neoplasm of bone: Secondary | ICD-10-CM

## 2015-12-24 DIAGNOSIS — C50911 Malignant neoplasm of unspecified site of right female breast: Secondary | ICD-10-CM

## 2015-12-24 DIAGNOSIS — C50919 Malignant neoplasm of unspecified site of unspecified female breast: Secondary | ICD-10-CM

## 2015-12-24 DIAGNOSIS — Z5111 Encounter for antineoplastic chemotherapy: Secondary | ICD-10-CM

## 2015-12-24 DIAGNOSIS — C7989 Secondary malignant neoplasm of other specified sites: Secondary | ICD-10-CM | POA: Diagnosis not present

## 2015-12-24 DIAGNOSIS — C50512 Malignant neoplasm of lower-outer quadrant of left female breast: Secondary | ICD-10-CM

## 2015-12-24 LAB — COMPREHENSIVE METABOLIC PANEL
ALBUMIN: 3.7 g/dL (ref 3.5–5.0)
ALK PHOS: 57 U/L (ref 40–150)
ALT: 96 U/L — ABNORMAL HIGH (ref 0–55)
AST: 109 U/L — AB (ref 5–34)
Anion Gap: 8 mEq/L (ref 3–11)
BUN: 14 mg/dL (ref 7.0–26.0)
CO2: 25 meq/L (ref 22–29)
Calcium: 10.5 mg/dL — ABNORMAL HIGH (ref 8.4–10.4)
Chloride: 105 mEq/L (ref 98–109)
Creatinine: 0.9 mg/dL (ref 0.6–1.1)
EGFR: 81 mL/min/{1.73_m2} — ABNORMAL LOW (ref >90)
GLUCOSE: 86 mg/dL (ref 70–140)
POTASSIUM: 4.2 meq/L (ref 3.5–5.1)
SODIUM: 139 meq/L (ref 136–145)
TOTAL PROTEIN: 7.3 g/dL (ref 6.4–8.3)
Total Bilirubin: 0.45 mg/dL (ref 0.20–1.20)

## 2015-12-24 LAB — CBC WITH DIFFERENTIAL/PLATELET
BASO%: 1.4 % (ref 0.0–2.0)
Basophils Absolute: 0.1 10*3/uL (ref 0.0–0.1)
EOS%: 0.9 % (ref 0.0–7.0)
Eosinophils Absolute: 0 10*3/uL (ref 0.0–0.5)
HCT: 33.2 % — ABNORMAL LOW (ref 34.8–46.6)
HEMOGLOBIN: 10.3 g/dL — AB (ref 11.6–15.9)
LYMPH%: 50 % — ABNORMAL HIGH (ref 14.0–49.7)
MCH: 24 pg — AB (ref 25.1–34.0)
MCHC: 31.2 g/dL — ABNORMAL LOW (ref 31.5–36.0)
MCV: 77.1 fL — ABNORMAL LOW (ref 79.5–101.0)
MONO#: 0.7 10*3/uL (ref 0.1–0.9)
MONO%: 14.7 % — AB (ref 0.0–14.0)
NEUT%: 33 % — ABNORMAL LOW (ref 38.4–76.8)
NEUTROS ABS: 1.6 10*3/uL (ref 1.5–6.5)
Platelets: 291 10*3/uL (ref 145–400)
RBC: 4.31 10*6/uL (ref 3.70–5.45)
RDW: 18.5 % — AB (ref 11.2–14.5)
WBC: 4.8 10*3/uL (ref 3.9–10.3)
lymph#: 2.4 10*3/uL (ref 0.9–3.3)

## 2015-12-24 MED ORDER — SODIUM CHLORIDE 0.9% FLUSH
10.0000 mL | INTRAVENOUS | Status: DC | PRN
  Filled 2015-12-24: qty 10

## 2015-12-24 MED ORDER — GEMCITABINE HCL CHEMO INJECTION 1 GM/26.3ML
800.0000 mg/m2 | Freq: Once | INTRAVENOUS | Status: AC
  Administered 2015-12-24: 1368 mg via INTRAVENOUS
  Filled 2015-12-24: qty 35.98

## 2015-12-24 MED ORDER — PROCHLORPERAZINE MALEATE 10 MG PO TABS
ORAL_TABLET | ORAL | Status: AC
  Filled 2015-12-24: qty 1

## 2015-12-24 MED ORDER — PROCHLORPERAZINE MALEATE 10 MG PO TABS
10.0000 mg | ORAL_TABLET | Freq: Once | ORAL | Status: AC
  Administered 2015-12-24: 10 mg via ORAL

## 2015-12-24 MED ORDER — HEPARIN SOD (PORK) LOCK FLUSH 100 UNIT/ML IV SOLN
500.0000 [IU] | Freq: Once | INTRAVENOUS | Status: DC | PRN
  Filled 2015-12-24: qty 5

## 2015-12-24 MED ORDER — SODIUM CHLORIDE 0.9 % IV SOLN
Freq: Once | INTRAVENOUS | Status: AC
  Administered 2015-12-24: 13:00:00 via INTRAVENOUS

## 2015-12-24 MED ORDER — DENOSUMAB 120 MG/1.7ML ~~LOC~~ SOLN
120.0000 mg | Freq: Once | SUBCUTANEOUS | Status: AC
  Administered 2015-12-24: 120 mg via SUBCUTANEOUS
  Filled 2015-12-24: qty 1.7

## 2015-12-24 NOTE — Patient Instructions (Signed)
[facility] Discharge Instructions for Patients Receiving Chemotherapy  Today you received the following chemotherapy agents GEMZAR  To help prevent nausea and vomiting after your treatment, we encourage you to take your nausea medication if needed.   If you develop nausea and vomiting that is not controlled by your nausea medication, call the clinic.   BELOW ARE SYMPTOMS THAT SHOULD BE REPORTED IMMEDIATELY:  *FEVER GREATER THAN 100.5 F  *CHILLS WITH OR WITHOUT FEVER  NAUSEA AND VOMITING THAT IS NOT CONTROLLED WITH YOUR NAUSEA MEDICATION  *UNUSUAL SHORTNESS OF BREATH  *UNUSUAL BRUISING OR BLEEDING  TENDERNESS IN MOUTH AND THROAT WITH OR WITHOUT PRESENCE OF ULCERS  *URINARY PROBLEMS  *BOWEL PROBLEMS  UNUSUAL RASH Items with * indicate a potential emergency and should be followed up as soon as possible.  Feel free to call the clinic you have any questions or concerns. The clinic phone number is [phone].  Please show the [place] [facility] at check-in to the Emergency Department and triage nurse.   INSTRUCTED PT TO USE WARM MOIST COMPRESSES ON RIGHT UPPER CHEST AT SPOT WHERE IR USED TO OBTAIN VENOUS ACCESS DURING PORT PLACEMENT. THIS IS VIA CYNDEE BACON NP/ KEVIN PA IN IR AND THE MD IN IR TODAY. NO ANTIBIOTICS NEEDED AT THIS TIME.

## 2015-12-24 NOTE — Telephone Encounter (Signed)
pt called to r/s appt...done....pt ok and aware of new time

## 2015-12-24 NOTE — Progress Notes (Signed)
CBC and CMET reviewed in detail with patient; okay to treat with ALT and AST elevation.

## 2016-01-05 NOTE — Assessment & Plan Note (Signed)
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 N1 M0 stage IIB  Radiology CT chest abdomen pelvis 05/28/2015: Developing subpectoral masses 3.5 cm and 4.5 cm. Four lung nodules the largest 1.4 cm in the right lung 1.7 cm sclerotic right iliac bone lesion concerning for metastatic disease Left subpectoral mass biopsy 06/21/2015: Invasive high-grade ductal carcinoma ER 5%, PR 0%, HER-2 negative ratio 1.29 Prior treatment: Xeloda 1500 mg by mouth twice a day started 07/07/2015 stopped 09/03/2015 (stopped for progression)  Bone metastases: Xgeva every 6 weeks with Calcium + D started January 2017 CT chest abdomen pelvis 09/01/15: Progressive left chest wall recurrence with an enlarging mass and severa; new sub cutaneous nodules. Progressive lung nodules -------------------------------------------------------------------------------------------------------------------------- Treatment plan: Carboplatin day 1 and gemcitabine days 1 and 8 every 3 weeks started 09/10/2015; today is cycle 5 day 1 (carboplatin discontinued for neuropathy)  Chemotherapy toxicities: 1. Fatigue 2. Anemia due to chemotherapy: hgb improved mildly to 9.0 3. 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 She will only be on gemcitabine days 1 and 8 every 3 weeks Clinically the left chest wall mass continues to shrink.   Pain issues: Oxycontin for long acting pain medication. Pain well controlled Insomnia: D/Ced Ativan and started restoril. Advised to try melatonin 45m as  well Lymphedema: physical therapy. Continue to wear compression sleeves daily.  Return to clinic with cycle 7 of chemotherapy

## 2016-01-07 ENCOUNTER — Other Ambulatory Visit (HOSPITAL_BASED_OUTPATIENT_CLINIC_OR_DEPARTMENT_OTHER): Payer: Medicare Other

## 2016-01-07 ENCOUNTER — Ambulatory Visit: Payer: Medicare Other

## 2016-01-07 ENCOUNTER — Ambulatory Visit (HOSPITAL_BASED_OUTPATIENT_CLINIC_OR_DEPARTMENT_OTHER): Payer: Medicare Other

## 2016-01-07 ENCOUNTER — Ambulatory Visit (HOSPITAL_BASED_OUTPATIENT_CLINIC_OR_DEPARTMENT_OTHER): Payer: Medicare Other | Admitting: Hematology and Oncology

## 2016-01-07 ENCOUNTER — Encounter: Payer: Self-pay | Admitting: Hematology and Oncology

## 2016-01-07 ENCOUNTER — Telehealth: Payer: Self-pay | Admitting: Hematology and Oncology

## 2016-01-07 VITALS — BP 131/85 | HR 82 | Temp 99.1°F | Resp 18 | Ht 62.0 in | Wt 136.8 lb

## 2016-01-07 DIAGNOSIS — C50512 Malignant neoplasm of lower-outer quadrant of left female breast: Secondary | ICD-10-CM

## 2016-01-07 DIAGNOSIS — D6481 Anemia due to antineoplastic chemotherapy: Secondary | ICD-10-CM | POA: Diagnosis not present

## 2016-01-07 DIAGNOSIS — C50919 Malignant neoplasm of unspecified site of unspecified female breast: Secondary | ICD-10-CM

## 2016-01-07 DIAGNOSIS — C50911 Malignant neoplasm of unspecified site of right female breast: Secondary | ICD-10-CM

## 2016-01-07 DIAGNOSIS — C50511 Malignant neoplasm of lower-outer quadrant of right female breast: Secondary | ICD-10-CM

## 2016-01-07 DIAGNOSIS — C7951 Secondary malignant neoplasm of bone: Secondary | ICD-10-CM

## 2016-01-07 DIAGNOSIS — Z95828 Presence of other vascular implants and grafts: Secondary | ICD-10-CM

## 2016-01-07 DIAGNOSIS — Z5111 Encounter for antineoplastic chemotherapy: Secondary | ICD-10-CM | POA: Diagnosis not present

## 2016-01-07 DIAGNOSIS — C50912 Malignant neoplasm of unspecified site of left female breast: Secondary | ICD-10-CM | POA: Diagnosis not present

## 2016-01-07 LAB — COMPREHENSIVE METABOLIC PANEL
ALT: 58 U/L — AB (ref 0–55)
AST: 57 U/L — AB (ref 5–34)
Albumin: 3.6 g/dL (ref 3.5–5.0)
Alkaline Phosphatase: 51 U/L (ref 40–150)
Anion Gap: 11 mEq/L (ref 3–11)
BUN: 13.5 mg/dL (ref 7.0–26.0)
CALCIUM: 9.3 mg/dL (ref 8.4–10.4)
CHLORIDE: 106 meq/L (ref 98–109)
CO2: 24 meq/L (ref 22–29)
CREATININE: 0.9 mg/dL (ref 0.6–1.1)
EGFR: 85 mL/min/{1.73_m2} — ABNORMAL LOW (ref >90)
GLUCOSE: 126 mg/dL (ref 70–140)
Potassium: 4.2 mEq/L (ref 3.5–5.1)
SODIUM: 140 meq/L (ref 136–145)
Total Bilirubin: 0.45 mg/dL (ref 0.20–1.20)
Total Protein: 7.3 g/dL (ref 6.4–8.3)

## 2016-01-07 LAB — CBC WITH DIFFERENTIAL/PLATELET
BASO%: 0.5 % (ref 0.0–2.0)
Basophils Absolute: 0 10*3/uL (ref 0.0–0.1)
EOS%: 2.7 % (ref 0.0–7.0)
Eosinophils Absolute: 0.2 10*3/uL (ref 0.0–0.5)
HEMATOCRIT: 34.8 % (ref 34.8–46.6)
HGB: 11 g/dL — ABNORMAL LOW (ref 11.6–15.9)
LYMPH#: 1.9 10*3/uL (ref 0.9–3.3)
LYMPH%: 25.4 % (ref 14.0–49.7)
MCH: 24.4 pg — ABNORMAL LOW (ref 25.1–34.0)
MCHC: 31.6 g/dL (ref 31.5–36.0)
MCV: 77.2 fL — ABNORMAL LOW (ref 79.5–101.0)
MONO#: 0.8 10*3/uL (ref 0.1–0.9)
MONO%: 10.4 % (ref 0.0–14.0)
NEUT%: 61 % (ref 38.4–76.8)
NEUTROS ABS: 4.6 10*3/uL (ref 1.5–6.5)
Platelets: 268 10*3/uL (ref 145–400)
RBC: 4.51 10*6/uL (ref 3.70–5.45)
RDW: 19.5 % — ABNORMAL HIGH (ref 11.2–14.5)
WBC: 7.5 10*3/uL (ref 3.9–10.3)
nRBC: 0 % (ref 0–0)

## 2016-01-07 MED ORDER — PROCHLORPERAZINE MALEATE 10 MG PO TABS
ORAL_TABLET | ORAL | Status: AC
  Filled 2016-01-07: qty 1

## 2016-01-07 MED ORDER — SODIUM CHLORIDE 0.9 % IV SOLN
800.0000 mg/m2 | Freq: Once | INTRAVENOUS | Status: AC
  Administered 2016-01-07: 1368 mg via INTRAVENOUS
  Filled 2016-01-07: qty 35.98

## 2016-01-07 MED ORDER — PROCHLORPERAZINE MALEATE 10 MG PO TABS
10.0000 mg | ORAL_TABLET | Freq: Once | ORAL | Status: AC
  Administered 2016-01-07: 10 mg via ORAL

## 2016-01-07 MED ORDER — SODIUM CHLORIDE 0.9 % IJ SOLN
10.0000 mL | INTRAMUSCULAR | Status: DC | PRN
  Administered 2016-01-07: 10 mL via INTRAVENOUS
  Filled 2016-01-07: qty 10

## 2016-01-07 MED ORDER — SODIUM CHLORIDE 0.9% FLUSH
10.0000 mL | INTRAVENOUS | Status: DC | PRN
  Administered 2016-01-07: 10 mL
  Filled 2016-01-07: qty 10

## 2016-01-07 MED ORDER — HEPARIN SOD (PORK) LOCK FLUSH 100 UNIT/ML IV SOLN
500.0000 [IU] | Freq: Once | INTRAVENOUS | Status: AC | PRN
  Administered 2016-01-07: 500 [IU]
  Filled 2016-01-07: qty 5

## 2016-01-07 MED ORDER — SODIUM CHLORIDE 0.9 % IV SOLN
Freq: Once | INTRAVENOUS | Status: AC
  Administered 2016-01-07: 10:00:00 via INTRAVENOUS

## 2016-01-07 NOTE — Progress Notes (Signed)
Patient Care Team: [name], MD as PCP - General (Family Medicine) [name], MD (Cardiology)  DIAGNOSIS: Bilateral breast cancer [facility])   Staging form: Breast, AJCC 7th Edition     Clinical: Stage IIB (T2, N1, cM0) - Unsigned       Staging comments: Staged at breast conference 08/13/13      Pathologic: No stage assigned - Unsigned   SUMMARY OF ONCOLOGIC HISTORY:   Bilateral breast cancer ([place])   07/23/2013 Mammogram Bilateral breast masses. 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 N1 M0 stage IIB ER 100%, PR 0%, HER-2    06/17/2014 -  Radiation Therapy Adjuvant radiation therapy   09/14/2014 -  Anti-estrogen oral therapy Anastrozole 1 mg daily   05/28/2015 Imaging CT scans: Enlarging subpectoral masses 3.1 x 3.5 cm, posteriorly lower density mass 4.5 x 2.1 cm, several right-sided lung nodules right lower lobe 1.4 cm, 3 other right lung nodules, 1.7 cm right iliac bone lesion   06/21/2015 Procedure Left subpectoral mass biopsy: Invasive high-grade  ductal carcinoma ER 5%, PR 0%, HER-2 negative ratio 1.29   09/01/2015 Imaging Left chest wall mass increased in size 7.2 x 5.1 cm, multiple subcutaneous nodules, increase in the lung nodules both in number as well as in the size of existing nodules   09/10/2015 -  Chemotherapy Carboplatin, gemcitabine days 1 and 8 q 3 weeks    CHIEF COMPLIANT: Cycle 6 carboplatin and gemcitabine  INTERVAL HISTORY: Paula Navarro is a 65 year old with above-mentioned history of metastatic breast cancer with large subpectoral mass received 6 cycles of chemotherapy with carboplatin and gemcitabine. Clinically the masses shrunk significantly in size. She is also much less pain. She denies any nausea vomiting. Tolerating chemotherapy fairly well except for the fact that she has to come here.  REVIEW OF SYSTEMS:   Constitutional: Denies fevers, chills or abnormal weight loss Eyes: Denies blurriness of vision Ears, nose, mouth, throat, and face: Denies mucositis or sore throat Respiratory: Denies cough, dyspnea or wheezes Cardiovascular: Denies palpitation, chest discomfort Gastrointestinal:  Denies nausea, heartburn or change in bowel habits Skin: Denies abnormal skin rashes Lymphatics: Denies new lymphadenopathy or easy bruising Neurological:Denies numbness, tingling or new weaknesses Behavioral/Psych: Mood is stable, no new changes  Extremities: No lower extremity edema Breast: Improvement in the left pectoral mass All other systems were reviewed with the patient and are negative.  I have reviewed the past medical history, past surgical history, social history and family history with the patient and they are unchanged from previous note.  ALLERGIES:  is allergic to codeine and morphine and related.  MEDICATIONS:  Current Outpatient Prescriptions  Medication Sig Dispense Refill  . B Complex-C (SUPER B COMPLEX PO) Take 1 tablet by mouth daily.    . calcium-vitamin D (OSCAL-500) 500-400 MG-UNIT tablet Take 1  tablet by mouth 2 (two) times daily. 60 tablet 3  . docusate sodium (COLACE) 100 MG capsule Take 1 capsule (100 mg total) by mouth every 12 (twelve) hours. 60 capsule 0  . gabapentin (NEURONTIN) 300 MG capsule TAKE 2 CAPSULES (600 MG TOTAL) BY MOUTH 3 (THREE) TIMES DAILY. 180 capsule 5  . lidocaine-prilocaine (EMLA) cream Apply to affected area once (Patient taking differently: Apply 1 application topically as needed. Apply to affected area once) 30 g 3  . ondansetron (ZOFRAN) 8 MG tablet Take 1 tablet (8 mg total) by mouth 2 (two) times daily as needed for refractory nausea / vomiting. Start on day 3 after carboplatin chemo. 30 tablet 1  . oxyCODONE (OXYCONTIN) 10 mg 12 hr tablet Take 1 tablet (10 mg total) by mouth every 12 (twelve) hours. 60 tablet 0  . oxyCODONE-acetaminophen (PERCOCET/ROXICET) 5-325 MG tablet 1 tabs PO q6h prn pain 60 tablet 0  . prochlorperazine (COMPAZINE) 10 MG tablet Take 1 tablet (10 mg total) by mouth every 6 (six) hours as needed (Nausea or vomiting). (Patient not taking: Reported on 10/22/2015) 30 tablet 1  . traZODone (DESYREL) 50 MG tablet Take 1 tablet (50 mg total) by mouth at bedtime. 7 tablet 0  . zolpidem (AMBIEN) 10 MG tablet Take 1 tablet (10 mg total) by mouth at bedtime as needed for sleep. 30 tablet 2   No current facility-administered medications for this visit.    PHYSICAL EXAMINATION: ECOG PERFORMANCE STATUS: 1 - Symptomatic but completely ambulatory  Filed Vitals:   01/07/16 0900  BP: 131/85  Pulse: 82  Temp: 99.1 F (37.3 C)  Resp: 18   Filed Weights   01/07/16 0900  Weight: 136 lb 12.8 oz (62.052 kg)    GENERAL:alert, no distress and comfortable SKIN: skin color, texture, turgor are normal, no rashes or significant lesions EYES: normal, Conjunctiva are pink and non-injected, sclera clear OROPHARYNX:no exudate, no erythema and lips, buccal mucosa, and tongue normal  NECK: supple, thyroid normal size, non-tender, without  nodularity LYMPH:  no palpable lymphadenopathy in the cervical, axillary or inguinal LUNGS: clear to auscultation and percussion with normal breathing effort HEART: regular rate & rhythm and no murmurs and no lower extremity edema ABDOMEN:abdomen soft, non-tender and normal bowel sounds MUSCULOSKELETAL:no cyanosis of digits and no clubbing  NEURO: alert & oriented x 3 with fluent speech, no focal motor/sensory deficits EXTREMITIES: No lower extremity edema BREAST:Decrease in size of the large left subpectoral mass  LABORATORY DATA:  I have reviewed the data as listed   Chemistry      Component Value Date/Time   NA 140 01/07/2016 0828   NA 135 09/20/2015 2209   K 4.2 01/07/2016 0828   K 4.2 09/20/2015 2209   CL 99* 09/20/2015 2209   CO2 24 01/07/2016 0828   CO2 26 09/20/2015 2209   BUN 13.5 01/07/2016 0828   BUN 11 09/20/2015 2209   CREATININE 0.9 01/07/2016 0828   CREATININE 0.65 09/20/2015 2209      Component Value Date/Time   CALCIUM 9.3 01/07/2016 0828   CALCIUM 8.7* 09/20/2015 2209   ALKPHOS 51 01/07/2016 0828   AST 57* 01/07/2016 0828   ALT 58* 01/07/2016 0828   BILITOT 0.45  01/07/2016 0828       Lab Results  Component Value Date   WBC 7.5 01/07/2016   HGB 11.0* 01/07/2016   HCT 34.8 01/07/2016   MCV 77.2* 01/07/2016   PLT 268 01/07/2016   NEUTROABS 4.6 01/07/2016     ASSESSMENT & PLAN:  Bilateral breast cancer ([place]) Treated with neoadjuvant chemotherapy followed by surgery 04/08/2014, radiation treatment completed 07/17/2014, antiestrogen therapy with anastrozole 09/14/2014 Left breast T2, N0, M0, IDC grade 3; 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 N1 M0 stage IIB  Radiology CT chest abdomen pelvis 05/28/2015: Developing subpectoral masses 3.5 cm and 4.5 cm. Four lung nodules the largest 1.4 cm  in the right lung 1.7 cm sclerotic right iliac bone lesion concerning for metastatic disease Left subpectoral mass biopsy 06/21/2015: Invasive high-grade ductal carcinoma ER 5%, PR 0%, HER-2 negative ratio 1.29 Prior treatment: Xeloda 1500 mg by mouth twice a day started 07/07/2015 stopped 09/03/2015 (stopped for progression)  Bone metastases: Xgeva every 6 weeks with Calcium + D started January 2017 CT chest abdomen pelvis 09/01/15: Progressive left chest wall recurrence with an enlarging mass and severa; new sub cutaneous nodules. Progressive lung nodules -------------------------------------------------------------------------------------------------------------------------- Treatment plan: Carboplatin day 1 and gemcitabine days 1 and 8 every 3 weeks started 09/10/2015; today is cycle 5 day 1 (carboplatin discontinued for neuropathy)  Chemotherapy toxicities: 1. Fatigue 2. Anemia due to chemotherapy: hgb improved mildly to 9.0 3. 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 She will only be on gemcitabine days 1 and 8 every 3 weeks Clinically the left chest wall mass continues to shrink.   Pain issues: Oxycontin for long acting pain medication. Pain well controlled Insomnia: D/Ced Ativan and started restoril. Advised to try melatonin 73m as well Lymphedema: physical therapy. Continue to wear compression sleeves daily.  I plan to obtain CT chest abdomen pelvis before the next cycle of chemotherapy. Patient wants a break from chemotherapy. They might give her a couple of months off before resuming chemotherapy. Return to clinic in 3 weeks for follow-up on the scans.   Orders Placed This Encounter  Procedures  . CT Chest W Contrast    Standing Status: Future     Number of Occurrences:      Standing Expiration Date: 01/06/2017    Order Specific Question:  If indicated for the  ordered procedure, I authorize the administration of contrast media per Radiology protocol    Answer:  Yes    Order Specific Question:  Reason for Exam (SYMPTOM  OR DIAGNOSIS REQUIRED)    Answer:  Metastatic breast cancer restaging    Order Specific Question:  [facility]    [facility] Abdomen Pelvis W Contrast    Standing Status: Future     Number of Occurrences:      Standing Expiration Date: 01/06/2017    Order Specific Question:  If indicated for the ordered procedure, I authorize the administration of contrast media per Radiology protocol    Answer:  Yes    Order Specific Question:  Reason for Exam (SYMPTOM  OR DIAGNOSIS REQUIRED)    Answer:  Met Breast cancer    Order Specific Question:  [facility]    [facility]   The patient has a good understanding of the overall plan. she agrees with it. she will call with any problems that may develop before the next visit here.   Rulon Eisenmenger, MD 01/07/2016

## 2016-01-07 NOTE — Telephone Encounter (Signed)
cld & l;eft pt a message in re to next appt 6/30 and adv of 6/16 to get contrast for CT that will be [facility]

## 2016-01-07 NOTE — Telephone Encounter (Signed)
perpof to sch pt appt-pt to get updated copy b4 leaqving trmt**

## 2016-01-07 NOTE — Patient Instructions (Signed)
[place] Discharge Instructions for Patients Receiving Chemotherapy  Today you received the following chemotherapy agents GEMZAR  To help prevent nausea and vomiting after your treatment, we encourage you to take your nausea medication if needed.   If you develop nausea and vomiting that is not controlled by your nausea medication, call the clinic.   BELOW ARE SYMPTOMS THAT SHOULD BE REPORTED IMMEDIATELY:  *FEVER GREATER THAN 100.5 F  *CHILLS WITH OR WITHOUT FEVER  NAUSEA AND VOMITING THAT IS NOT CONTROLLED WITH YOUR NAUSEA MEDICATION  *UNUSUAL SHORTNESS OF BREATH  *UNUSUAL BRUISING OR BLEEDING  TENDERNESS IN MOUTH AND THROAT WITH OR WITHOUT PRESENCE OF ULCERS  *URINARY PROBLEMS  *BOWEL PROBLEMS  UNUSUAL RASH Items with * indicate a potential emergency and should be followed up as soon as possible.  Feel free to call the clinic you have any questions or concerns. The clinic phone number is [phone].  Please show the [place] at check-in to the Emergency Department and triage nurse.   INSTRUCTED PT TO USE WARM MOIST COMPRESSES ON RIGHT UPPER CHEST AT SPOT WHERE IR USED TO OBTAIN VENOUS ACCESS DURING PORT PLACEMENT. THIS IS VIA CYNDEE BACON NP/ KEVIN PA IN IR AND THE MD IN IR TODAY. NO ANTIBIOTICS NEEDED AT THIS TIME.

## 2016-01-07 NOTE — Patient Instructions (Signed)

## 2016-01-10 ENCOUNTER — Other Ambulatory Visit: Payer: Self-pay

## 2016-01-10 ENCOUNTER — Telehealth: Payer: Self-pay

## 2016-01-10 DIAGNOSIS — C50512 Malignant neoplasm of lower-outer quadrant of left female breast: Principal | ICD-10-CM

## 2016-01-10 DIAGNOSIS — C50511 Malignant neoplasm of lower-outer quadrant of right female breast: Secondary | ICD-10-CM

## 2016-01-10 MED ORDER — OXYCODONE-ACETAMINOPHEN 5-325 MG PO TABS: ORAL_TABLET | ORAL | Status: DC

## 2016-01-10 NOTE — Telephone Encounter (Signed)
Spoke with Dr. Lindi Adie regarding information obtained from pt after EMS evaluated pt last evening.  Pt reports she was not taken any where for further evaluation; however, she was seen by EMS who told her she had a fib.  I informed pt per Dr. Lindi Adie, she needed to follow up with her cardiologist.  While on phone with pt she mentioned vaginal bleeding that has been going on since chemotherapy initiation.  Pt states [place] aware but wants to know when she can have polyp removed which is causing this bleeding.  Per Dr. Lindi Adie, earliest he recommends removal is 2 weeks after last dose of chemotherapy.  Pt instructed to contact her gyn office regarding this further.  All information given to pt and all questions answered.  Pt without concerns at time of call and verbalizes to contact us with any future needs.

## 2016-01-10 NOTE — Telephone Encounter (Signed)
Called pt to inform her that her refill was ready for pick up.

## 2016-01-10 NOTE — Telephone Encounter (Signed)
Received fax from [facility].  Report states Paula Navarro called in last evening at 11:07pm for shortness of breath and back pain.  According to note, Paula Navarro was instructed to go to ED.  No further information in chart whether Paula Navarro presented to ED or not.  Called Paula Navarro to follow up.  Paula Navarro reports calling ambulance for evaluation and being told she had atrial fibrillation.  Paula Navarro states she has never been told she had this before.  Paula Navarro denies shortness of breath and back pain currently and says things are better.  I informed Paula Navarro I would update Dr. Lindi Adie with this information and follow up as necessary.  Paula Navarro also requesting refill on Norco prescription.  Paula Navarro verbalized understanding to contact us with any new symptoms or questions.

## 2016-01-14 ENCOUNTER — Other Ambulatory Visit: Payer: Medicare Other

## 2016-01-14 ENCOUNTER — Ambulatory Visit: Payer: Medicare Other

## 2016-01-17 ENCOUNTER — Other Ambulatory Visit: Payer: Self-pay

## 2016-01-17 ENCOUNTER — Telehealth: Payer: Self-pay | Admitting: *Deleted

## 2016-01-17 ENCOUNTER — Telehealth: Payer: Self-pay | Admitting: Hematology and Oncology

## 2016-01-17 NOTE — Telephone Encounter (Signed)
left msg confirming 6/21 apt

## 2016-01-17 NOTE — Telephone Encounter (Signed)
left msg confirming added lab/flush apt confirmed 6/21 apt times

## 2016-01-17 NOTE — Telephone Encounter (Signed)
Per staff message and POF I have scheduled appts. Advised scheduler of appts. JMW  

## 2016-01-18 ENCOUNTER — Telehealth: Payer: Self-pay | Admitting: *Deleted

## 2016-01-18 NOTE — Telephone Encounter (Signed)
VM message received from pt @ 3:23 pm inquiring about an appt at [facility] in [place] [facility]. She states that appparently she has an appt there on 01/20/16 but she does not remember making it and was wondering if we made the appt.  In reviewing past notes-it was recommended that pt contact her GYN doctor for polyp removal. This office did not schedule that appt.  Attempted call back to cell phone-pt has not set up voice mail.  Attempted call back 2 home phone #. Left message for pt to call back in the am.

## 2016-01-19 ENCOUNTER — Telehealth: Payer: Self-pay | Admitting: *Deleted

## 2016-01-19 ENCOUNTER — Other Ambulatory Visit: Payer: Self-pay

## 2016-01-19 ENCOUNTER — Ambulatory Visit: Payer: Medicare Other

## 2016-01-19 ENCOUNTER — Telehealth: Payer: Self-pay | Admitting: Hematology and Oncology

## 2016-01-19 ENCOUNTER — Other Ambulatory Visit: Payer: Medicare Other

## 2016-01-19 NOTE — Telephone Encounter (Signed)
left msg confirming 6/22 apt

## 2016-01-19 NOTE — Progress Notes (Signed)
Received call from triage stating pt called questioning missed appointments today.  Pt stated to triage RN that she was unaware she was scheduled for today.  Pt inquiring whether she still needs to have scan done 6/23 as scheduled.  Triage RN stated she would have someone call pt back with this information.  After information relayed to this RN, call attempted x 2 to speak with pt regarding upcoming appointments.  Line disconnected on first attempt and voicemail not set up for message on second attempt.  Should pt call back, Dr. Lindi Adie stated she could come in tomorrow for chemotherapy if able and pt should keep appointment 6/23 for scheduled scan.

## 2016-01-19 NOTE — Progress Notes (Signed)
Received call from pt regarding appointments.  Pt in agreement to come in tomorrow for last chemotherapy treatment.  Explained to pt that she will be contacted with specific time once this is scheduled.  Also reviewed with pt that her scan is still scheduled for Friday 01/21/16 and she understands to keep this appointment.  Pt without further questions or concerns at time of call.

## 2016-01-19 NOTE — Telephone Encounter (Signed)
Per staff message and POF I have scheduled appts. Advised scheduler of appts. JMW  

## 2016-01-20 ENCOUNTER — Ambulatory Visit: Payer: Medicare Other

## 2016-01-20 ENCOUNTER — Ambulatory Visit (HOSPITAL_BASED_OUTPATIENT_CLINIC_OR_DEPARTMENT_OTHER): Payer: Medicare Other

## 2016-01-20 ENCOUNTER — Encounter: Payer: Self-pay | Admitting: Adult Health

## 2016-01-20 ENCOUNTER — Other Ambulatory Visit (HOSPITAL_BASED_OUTPATIENT_CLINIC_OR_DEPARTMENT_OTHER): Payer: Medicare Other

## 2016-01-20 VITALS — BP 128/71 | HR 80 | Temp 98.9°F | Resp 18

## 2016-01-20 DIAGNOSIS — C50911 Malignant neoplasm of unspecified site of right female breast: Secondary | ICD-10-CM

## 2016-01-20 DIAGNOSIS — C7951 Secondary malignant neoplasm of bone: Secondary | ICD-10-CM

## 2016-01-20 DIAGNOSIS — Z5111 Encounter for antineoplastic chemotherapy: Secondary | ICD-10-CM | POA: Diagnosis not present

## 2016-01-20 DIAGNOSIS — C50512 Malignant neoplasm of lower-outer quadrant of left female breast: Secondary | ICD-10-CM

## 2016-01-20 DIAGNOSIS — C50919 Malignant neoplasm of unspecified site of unspecified female breast: Secondary | ICD-10-CM

## 2016-01-20 DIAGNOSIS — Z95828 Presence of other vascular implants and grafts: Secondary | ICD-10-CM

## 2016-01-20 DIAGNOSIS — C50511 Malignant neoplasm of lower-outer quadrant of right female breast: Secondary | ICD-10-CM

## 2016-01-20 LAB — COMPREHENSIVE METABOLIC PANEL
ALBUMIN: 3.6 g/dL (ref 3.5–5.0)
ALK PHOS: 49 U/L (ref 40–150)
ALT: 67 U/L — ABNORMAL HIGH (ref 0–55)
ANION GAP: 9 meq/L (ref 3–11)
AST: 68 U/L — ABNORMAL HIGH (ref 5–34)
BILIRUBIN TOTAL: 0.53 mg/dL (ref 0.20–1.20)
BUN: 12.7 mg/dL (ref 7.0–26.0)
CALCIUM: 9.6 mg/dL (ref 8.4–10.4)
CO2: 27 mEq/L (ref 22–29)
Chloride: 107 mEq/L (ref 98–109)
Creatinine: 0.8 mg/dL (ref 0.6–1.1)
EGFR: 86 mL/min/{1.73_m2} — AB (ref >90)
GLUCOSE: 84 mg/dL (ref 70–140)
Potassium: 3.7 mEq/L (ref 3.5–5.1)
SODIUM: 142 meq/L (ref 136–145)
TOTAL PROTEIN: 7.2 g/dL (ref 6.4–8.3)

## 2016-01-20 LAB — CBC WITH DIFFERENTIAL/PLATELET
BASO%: 0.9 % (ref 0.0–2.0)
BASOS ABS: 0.1 10*3/uL (ref 0.0–0.1)
EOS%: 2.4 % (ref 0.0–7.0)
Eosinophils Absolute: 0.2 10*3/uL (ref 0.0–0.5)
HEMATOCRIT: 35.4 % (ref 34.8–46.6)
HEMOGLOBIN: 11 g/dL — AB (ref 11.6–15.9)
LYMPH#: 2.1 10*3/uL (ref 0.9–3.3)
LYMPH%: 26.5 % (ref 14.0–49.7)
MCH: 24 pg — AB (ref 25.1–34.0)
MCHC: 31.2 g/dL — ABNORMAL LOW (ref 31.5–36.0)
MCV: 76.7 fL — AB (ref 79.5–101.0)
MONO#: 1.2 10*3/uL — AB (ref 0.1–0.9)
MONO%: 15.4 % — ABNORMAL HIGH (ref 0.0–14.0)
NEUT%: 54.8 % (ref 38.4–76.8)
NEUTROS ABS: 4.4 10*3/uL (ref 1.5–6.5)
PLATELETS: 220 10*3/uL (ref 145–400)
RBC: 4.61 10*6/uL (ref 3.70–5.45)
RDW: 19.6 % — AB (ref 11.2–14.5)
WBC: 8 10*3/uL (ref 3.9–10.3)

## 2016-01-20 MED ORDER — SODIUM CHLORIDE 0.9 % IJ SOLN
10.0000 mL | INTRAMUSCULAR | Status: DC | PRN
  Administered 2016-01-20: 10 mL via INTRAVENOUS
  Filled 2016-01-20: qty 10

## 2016-01-20 MED ORDER — GEMCITABINE HCL CHEMO INJECTION 1 GM/26.3ML
800.0000 mg/m2 | Freq: Once | INTRAVENOUS | Status: AC
  Administered 2016-01-20: 1368 mg via INTRAVENOUS
  Filled 2016-01-20: qty 35.98

## 2016-01-20 MED ORDER — HEPARIN SOD (PORK) LOCK FLUSH 100 UNIT/ML IV SOLN
500.0000 [IU] | Freq: Once | INTRAVENOUS | Status: AC | PRN
  Administered 2016-01-20: 500 [IU]
  Filled 2016-01-20: qty 5

## 2016-01-20 MED ORDER — PROCHLORPERAZINE MALEATE 10 MG PO TABS
ORAL_TABLET | ORAL | Status: AC
  Filled 2016-01-20: qty 1

## 2016-01-20 MED ORDER — PROCHLORPERAZINE MALEATE 10 MG PO TABS
10.0000 mg | ORAL_TABLET | Freq: Once | ORAL | Status: AC
  Administered 2016-01-20: 10 mg via ORAL

## 2016-01-20 MED ORDER — SODIUM CHLORIDE 0.9 % IV SOLN
Freq: Once | INTRAVENOUS | Status: AC
  Administered 2016-01-20: 16:00:00 via INTRAVENOUS

## 2016-01-20 MED ORDER — SODIUM CHLORIDE 0.9% FLUSH
10.0000 mL | INTRAVENOUS | Status: DC | PRN
  Administered 2016-01-20: 10 mL
  Filled 2016-01-20: qty 10

## 2016-01-20 NOTE — Patient Instructions (Signed)

## 2016-01-20 NOTE — Patient Instructions (Signed)
[facility] Discharge Instructions for Patients Receiving Chemotherapy  Today you received the following chemotherapy agents GEMZAR  To help prevent nausea and vomiting after your treatment, we encourage you to take your nausea medication if needed.   If you develop nausea and vomiting that is not controlled by your nausea medication, call the clinic.   BELOW ARE SYMPTOMS THAT SHOULD BE REPORTED IMMEDIATELY:  *FEVER GREATER THAN 100.5 F  *CHILLS WITH OR WITHOUT FEVER  NAUSEA AND VOMITING THAT IS NOT CONTROLLED WITH YOUR NAUSEA MEDICATION  *UNUSUAL SHORTNESS OF BREATH  *UNUSUAL BRUISING OR BLEEDING  TENDERNESS IN MOUTH AND THROAT WITH OR WITHOUT PRESENCE OF ULCERS  *URINARY PROBLEMS  *BOWEL PROBLEMS  UNUSUAL RASH Items with * indicate a potential emergency and should be followed up as soon as possible.  Feel free to call the clinic you have any questions or concerns. The clinic phone number is [phone].  Please show the [place] at check-in to the Emergency Department and triage nurse.

## 2016-01-21 ENCOUNTER — Ambulatory Visit (HOSPITAL_COMMUNITY): Payer: Medicare Other

## 2016-01-26 ENCOUNTER — Ambulatory Visit (HOSPITAL_COMMUNITY)
Admission: RE | Admit: 2016-01-26 | Discharge: 2016-01-26 | Disposition: A | Discharge status: Home / Self Care | Payer: Medicare Other | Source: Ambulatory Visit | Attending: Hematology and Oncology | Admitting: Hematology and Oncology

## 2016-01-26 ENCOUNTER — Encounter (HOSPITAL_COMMUNITY): Payer: Self-pay

## 2016-01-26 DIAGNOSIS — K760 Fatty (change of) liver, not elsewhere classified: Secondary | ICD-10-CM | POA: Insufficient documentation

## 2016-01-26 DIAGNOSIS — I517 Cardiomegaly: Secondary | ICD-10-CM | POA: Insufficient documentation

## 2016-01-26 DIAGNOSIS — J439 Emphysema, unspecified: Secondary | ICD-10-CM | POA: Insufficient documentation

## 2016-01-26 DIAGNOSIS — R1909 Other intra-abdominal and pelvic swelling, mass and lump: Secondary | ICD-10-CM | POA: Insufficient documentation

## 2016-01-26 DIAGNOSIS — C50911 Malignant neoplasm of unspecified site of right female breast: Secondary | ICD-10-CM | POA: Diagnosis present

## 2016-01-26 DIAGNOSIS — I700 Atherosclerosis of aorta: Secondary | ICD-10-CM | POA: Insufficient documentation

## 2016-01-26 DIAGNOSIS — C50912 Malignant neoplasm of unspecified site of left female breast: Secondary | ICD-10-CM | POA: Diagnosis present

## 2016-01-26 DIAGNOSIS — N281 Cyst of kidney, acquired: Secondary | ICD-10-CM | POA: Diagnosis not present

## 2016-01-26 MED ORDER — IOPAMIDOL (ISOVUE-300) INJECTION 61%
100.0000 mL | Freq: Once | INTRAVENOUS | Status: AC | PRN
  Administered 2016-01-26: 100 mL via INTRAVENOUS

## 2016-01-28 ENCOUNTER — Other Ambulatory Visit: Payer: Medicare Other

## 2016-01-28 ENCOUNTER — Ambulatory Visit: Payer: Medicare Other

## 2016-01-28 ENCOUNTER — Ambulatory Visit: Payer: Medicare Other | Admitting: Hematology and Oncology

## 2016-01-28 ENCOUNTER — Telehealth: Payer: Self-pay | Admitting: *Deleted

## 2016-01-28 NOTE — Telephone Encounter (Signed)
Patient called to cancel today's appointments. Patient states that she will call back to schedule.

## 2016-01-28 NOTE — Assessment & Plan Note (Signed)
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 N1 M0 stage IIB  Radiology CT chest abdomen pelvis 05/28/2015: Developing subpectoral masses 3.5 cm and 4.5 cm. Four lung nodules the largest 1.4 cm in the right lung 1.7 cm sclerotic right iliac bone lesion concerning for metastatic disease Left subpectoral mass biopsy 06/21/2015: Invasive high-grade ductal carcinoma ER 5%, PR 0%, HER-2 negative ratio 1.29 Prior treatment: Xeloda 1500 mg by mouth twice a day started 07/07/2015 stopped 09/03/2015 (stopped for progression)  Bone metastases: Xgeva every 6 weeks with Calcium + D started January 2017 CT chest abdomen pelvis 09/01/15: Progressive left chest wall recurrence with an enlarging mass and severa; new sub cutaneous nodules. Progressive lung nodules -------------------------------------------------------------------------------------------------------------------------- Treatment plan: Carboplatin day 1 and gemcitabine days 1 and 8 every 3 weeks started 09/10/2015; completed 6 cycles as of 01/18/2016 (carboplatin discontinued for neuropathy after cycle 4)  Chemotherapy toxicities: 1. Fatigue 2. Anemia due to chemotherapy: hgb improved mildly to 9.0 3. Severe leukocytosis with neutrophils markedly increased due to Neulasta: Neulasta discontinued  4. Grade 2 neuropathy: carboplatin discontinued after cycle 4. She will only be on gemcitabine days 1 and 8 every 3 weeks Clinically the left chest wall mass continues to shrink.   Pain issues: Oxycontin for long acting pain medication. Pain well controlled Insomnia: D/Ced Ativan and started restoril.  Advised to try melatonin 67m as well Lymphedema: physical therapy. Continue to wear compression sleeves daily.  CT chest abdomen and pelvis 01/26/2016: Market improvement, market reduction in size of the lung nodules with some resolution of lung nodules, market reduction in the left breast mass. Enlargement in the hypodense mass anterior to the in the uterus extends along the endometrium it could be a submucosal fibroid. Severe emphysema, diffuse hepatic steatosis.  Radiology review: I discussed the findings of the CT scan with the patient and provided her with a copy of this report. I recommended that she see her gynecologist regarding the uterine changes. I recommended that she continue with chemotherapy but because she wanted to get a break from treatment, we plan to give her 6 week break from treatment and then resume with single agent gemcitabine chemotherapy.  Return to clinic in 6 weeks to restart gemcitabine.

## 2016-02-02 ENCOUNTER — Telehealth: Payer: Self-pay | Admitting: Hematology and Oncology

## 2016-02-02 ENCOUNTER — Telehealth: Payer: Self-pay | Admitting: *Deleted

## 2016-02-02 ENCOUNTER — Other Ambulatory Visit: Payer: Self-pay

## 2016-02-02 DIAGNOSIS — C50512 Malignant neoplasm of lower-outer quadrant of left female breast: Principal | ICD-10-CM

## 2016-02-02 DIAGNOSIS — C50511 Malignant neoplasm of lower-outer quadrant of right female breast: Secondary | ICD-10-CM

## 2016-02-02 MED ORDER — OXYCODONE-ACETAMINOPHEN 5-325 MG PO TABS: ORAL_TABLET | ORAL | Status: DC

## 2016-02-02 NOTE — Telephone Encounter (Signed)
pt cld to r/s appt-gave pt cime &date of appt

## 2016-02-02 NOTE — Telephone Encounter (Signed)
Patient needs refill for Percocet. Please call once ready for pick up. Message sent to RN [name].

## 2016-02-02 NOTE — Telephone Encounter (Signed)
LMOVM - Rx ready to pickup.

## 2016-02-04 MED FILL — OXYCODONE/APAP 5/325MG: 5-325 | 15 days supply | Qty: 60 | Fill #0

## 2016-02-11 ENCOUNTER — Other Ambulatory Visit (HOSPITAL_BASED_OUTPATIENT_CLINIC_OR_DEPARTMENT_OTHER): Payer: Medicare Other

## 2016-02-11 ENCOUNTER — Telehealth: Payer: Self-pay | Admitting: Hematology and Oncology

## 2016-02-11 ENCOUNTER — Ambulatory Visit (HOSPITAL_BASED_OUTPATIENT_CLINIC_OR_DEPARTMENT_OTHER): Payer: Medicare Other | Admitting: Hematology and Oncology

## 2016-02-11 ENCOUNTER — Other Ambulatory Visit: Payer: Self-pay

## 2016-02-11 ENCOUNTER — Encounter: Payer: Self-pay | Admitting: Hematology and Oncology

## 2016-02-11 VITALS — BP 129/70 | HR 82 | Temp 98.4°F | Resp 18 | Ht 62.0 in | Wt 138.5 lb

## 2016-02-11 DIAGNOSIS — C7951 Secondary malignant neoplasm of bone: Secondary | ICD-10-CM

## 2016-02-11 DIAGNOSIS — C50912 Malignant neoplasm of unspecified site of left female breast: Secondary | ICD-10-CM | POA: Diagnosis not present

## 2016-02-11 DIAGNOSIS — G4700 Insomnia, unspecified: Secondary | ICD-10-CM

## 2016-02-11 DIAGNOSIS — I890 Lymphedema, not elsewhere classified: Secondary | ICD-10-CM

## 2016-02-11 DIAGNOSIS — C50911 Malignant neoplasm of unspecified site of right female breast: Secondary | ICD-10-CM | POA: Diagnosis not present

## 2016-02-11 DIAGNOSIS — C50511 Malignant neoplasm of lower-outer quadrant of right female breast: Secondary | ICD-10-CM

## 2016-02-11 DIAGNOSIS — R918 Other nonspecific abnormal finding of lung field: Secondary | ICD-10-CM | POA: Diagnosis not present

## 2016-02-11 DIAGNOSIS — C50919 Malignant neoplasm of unspecified site of unspecified female breast: Secondary | ICD-10-CM

## 2016-02-11 DIAGNOSIS — C7801 Secondary malignant neoplasm of right lung: Secondary | ICD-10-CM

## 2016-02-11 DIAGNOSIS — C50112 Malignant neoplasm of central portion of left female breast: Principal | ICD-10-CM

## 2016-02-11 DIAGNOSIS — C50512 Malignant neoplasm of lower-outer quadrant of left female breast: Secondary | ICD-10-CM

## 2016-02-11 DIAGNOSIS — C50111 Malignant neoplasm of central portion of right female breast: Secondary | ICD-10-CM

## 2016-02-11 DIAGNOSIS — Z95828 Presence of other vascular implants and grafts: Secondary | ICD-10-CM

## 2016-02-11 LAB — COMPREHENSIVE METABOLIC PANEL
ALT: 58 U/L — AB (ref 0–55)
ANION GAP: 12 meq/L — AB (ref 3–11)
AST: 71 U/L — ABNORMAL HIGH (ref 5–34)
Albumin: 3.6 g/dL (ref 3.5–5.0)
Alkaline Phosphatase: 54 U/L (ref 40–150)
BUN: 12 mg/dL (ref 7.0–26.0)
CHLORIDE: 105 meq/L (ref 98–109)
CO2: 23 meq/L (ref 22–29)
Calcium: 9.8 mg/dL (ref 8.4–10.4)
Creatinine: 0.9 mg/dL (ref 0.6–1.1)
EGFR: 79 mL/min/{1.73_m2} — AB (ref >90)
Glucose: 173 mg/dl — ABNORMAL HIGH (ref 70–140)
Potassium: 4 mEq/L (ref 3.5–5.1)
Sodium: 140 mEq/L (ref 136–145)
Total Bilirubin: 0.69 mg/dL (ref 0.20–1.20)
Total Protein: 7.4 g/dL (ref 6.4–8.3)

## 2016-02-11 LAB — CBC WITH DIFFERENTIAL/PLATELET
BASO%: 1.1 % (ref 0.0–2.0)
Basophils Absolute: 0.1 10*3/uL (ref 0.0–0.1)
EOS%: 3.3 % (ref 0.0–7.0)
Eosinophils Absolute: 0.3 10*3/uL (ref 0.0–0.5)
HCT: 38.6 % (ref 34.8–46.6)
HGB: 12.1 g/dL (ref 11.6–15.9)
LYMPH%: 24.2 % (ref 14.0–49.7)
MCH: 23.8 pg — ABNORMAL LOW (ref 25.1–34.0)
MCHC: 31.3 g/dL — AB (ref 31.5–36.0)
MCV: 76.2 fL — AB (ref 79.5–101.0)
MONO#: 1 10*3/uL — AB (ref 0.1–0.9)
MONO%: 9.9 % (ref 0.0–14.0)
NEUT#: 6 10*3/uL (ref 1.5–6.5)
NEUT%: 61.5 % (ref 38.4–76.8)
PLATELETS: 323 10*3/uL (ref 145–400)
RBC: 5.07 10*6/uL (ref 3.70–5.45)
RDW: 18.6 % — ABNORMAL HIGH (ref 11.2–14.5)
WBC: 9.8 10*3/uL (ref 3.9–10.3)
lymph#: 2.4 10*3/uL (ref 0.9–3.3)

## 2016-02-11 MED ORDER — SODIUM CHLORIDE 0.9 % IJ SOLN
10.0000 mL | INTRAMUSCULAR | Status: DC | PRN
  Administered 2016-02-11: 10 mL via INTRAVENOUS
  Filled 2016-02-11: qty 10

## 2016-02-11 MED ORDER — HEPARIN SOD (PORK) LOCK FLUSH 100 UNIT/ML IV SOLN
500.0000 [IU] | Freq: Once | INTRAVENOUS | Status: AC | PRN
  Administered 2016-02-11: 500 [IU] via INTRAVENOUS
  Filled 2016-02-11: qty 5

## 2016-02-11 MED ORDER — ZOLPIDEM TARTRATE 10 MG PO TABS: 10.0000 mg | ORAL_TABLET | Freq: Every evening | ORAL | Status: DC | PRN

## 2016-02-11 NOTE — Telephone Encounter (Signed)
appt made and avs printed °

## 2016-02-11 NOTE — Progress Notes (Signed)
Patient Care Team: [name], MD as PCP - General (Family Medicine) [name], MD (Cardiology)  DIAGNOSIS: Bilateral breast cancer [facility])   Staging form: Breast, AJCC 7th Edition     Clinical: Stage IIB (T2, N1, cM0) - Unsigned       Staging comments: Staged at breast conference 08/13/13      Pathologic: No stage assigned - Unsigned   SUMMARY OF ONCOLOGIC HISTORY:   Bilateral breast cancer ([place])   07/23/2013 Mammogram Bilateral breast masses. 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 N1 M0 stage IIB ER 100%, PR 0%, HER-2    06/17/2014 -  Radiation Therapy Adjuvant radiation therapy   09/14/2014 -  Anti-estrogen oral therapy Anastrozole 1 mg daily   05/28/2015 Imaging CT scans: Enlarging subpectoral masses 3.1 x 3.5 cm, posteriorly lower density mass 4.5 x 2.1 cm, several right-sided lung nodules right lower lobe 1.4 cm, 3 other right lung nodules, 1.7 cm right iliac bone lesion   06/21/2015 Procedure Left subpectoral mass biopsy: Invasive high-grade  ductal carcinoma ER 5%, PR 0%, HER-2 negative ratio 1.29   09/01/2015 Imaging Left chest wall mass increased in size 7.2 x 5.1 cm, multiple subcutaneous nodules, increase in the lung nodules both in number as well as in the size of existing nodules   09/10/2015 -  Chemotherapy Carboplatin, gemcitabine days 1 and 8 q 3 weeks   01/26/2016 Imaging Marked improvement in size of lung nodules with many of the nodules resolved index right lower lobe nodule 1.9 x 1.8 cm is now 0.7 x 0.6 cm, reduction in the size of left breast mass 7.2 cm down to 2.6 cm, enlargement in the hypodense mass in the uterus    CHIEF COMPLIANT: Follow-up to review the CT scans  INTERVAL HISTORY: Paula Navarro is a 65 year old with above-mentioned history metastatic breast cancer with lung nodules and a infraclavicular mass who underwent chemotherapy and had a CT of the chest abdomen and pelvis and is here to discuss the results. She is currently on a chemotherapy treatment break. She reports that the chest wall mass is significantly softer and smaller.  REVIEW OF SYSTEMS:   Constitutional: Denies fevers, chills or abnormal weight loss Eyes: Denies blurriness of vision Ears, nose, mouth, throat, and face: Denies mucositis or sore throat Respiratory: Denies cough, dyspnea or wheezes Cardiovascular: Denies palpitation, chest discomfort Gastrointestinal:  Denies nausea, heartburn or change in bowel habits Skin: Denies abnormal skin rashes Lymphatics: Denies new lymphadenopathy or easy bruising Neurological:Denies numbness, tingling or new weaknesses Behavioral/Psych:  Mood is stable, no new changes  Extremities: No lower extremity edema Breast:  Left infraclavicular mass is softer All other systems were reviewed with the patient and are negative.  I have reviewed the past medical history, past surgical history, social history and family history with the patient and they are unchanged from previous note.  ALLERGIES:  is allergic  to codeine and morphine and related.  MEDICATIONS:  Current Outpatient Prescriptions  Medication Sig Dispense Refill  . B Complex-C (SUPER B COMPLEX PO) Take 1 tablet by mouth daily.    . calcium-vitamin D (OSCAL-500) 500-400 MG-UNIT tablet Take 1 tablet by mouth 2 (two) times daily. 60 tablet 3  . docusate sodium (COLACE) 100 MG capsule Take 1 capsule (100 mg total) by mouth every 12 (twelve) hours. 60 capsule 0  . gabapentin (NEURONTIN) 300 MG capsule TAKE 2 CAPSULES (600 MG TOTAL) BY MOUTH 3 (THREE) TIMES DAILY. 180 capsule 5  . oxyCODONE (OXYCONTIN) 10 mg 12 hr tablet Take 1 tablet (10 mg total) by mouth every 12 (twelve) hours. 60 tablet 0  . oxyCODONE-acetaminophen (PERCOCET/ROXICET) 5-325 MG tablet 1 tabs PO q6h prn pain 60 tablet 0  . traZODone (DESYREL) 50 MG tablet Take 1 tablet (50 mg total) by mouth at bedtime. 7 tablet 0  . zolpidem (AMBIEN) 10 MG tablet Take 1 tablet (10 mg total) by mouth at bedtime as needed for sleep. 30 tablet 2   Current Facility-Administered Medications  Medication Dose Route Frequency Provider Last Rate Last Dose  . sodium chloride 0.9 % injection 10 mL  10 mL Intravenous PRN Nicholas Lose, MD   10 mL at 02/11/16 1132    PHYSICAL EXAMINATION: ECOG PERFORMANCE STATUS: 1 - Symptomatic but completely ambulatory  Filed Vitals:   02/11/16 1124  BP: 129/70  Pulse: 82  Temp: 98.4 F (36.9 C)  Resp: 18   Filed Weights   02/11/16 1124  Weight: 138 lb 8 oz (62.823 kg)    GENERAL:alert, no distress and comfortable SKIN: skin color, texture, turgor are normal, no rashes or significant lesions EYES: normal, Conjunctiva are pink and non-injected, sclera clear OROPHARYNX:no exudate, no erythema and lips, buccal mucosa, and tongue normal  NECK: supple, thyroid normal size, non-tender, without nodularity LYMPH:  no palpable lymphadenopathy in the cervical, axillary or inguinal LUNGS: clear to auscultation and percussion with normal breathing effort HEART:  regular rate & rhythm and no murmurs and no lower extremity edema ABDOMEN:abdomen soft, non-tender and normal bowel sounds MUSCULOSKELETAL:no cyanosis of digits and no clubbing  NEURO: alert & oriented x 3 with fluent speech, no focal motor/sensory deficits EXTREMITIES: No lower extremity edema  LABORATORY DATA:  I have reviewed the data as listed   Chemistry      Component Value Date/Time   NA 142 01/20/2016 1517   NA 135 09/20/2015 2209   K 3.7 01/20/2016 1517   K 4.2 09/20/2015 2209   CL 99* 09/20/2015 2209   CO2 27 01/20/2016 1517   CO2 26 09/20/2015 2209   BUN 12.7 01/20/2016 1517   BUN 11 09/20/2015 2209   CREATININE 0.8 01/20/2016 1517   CREATININE 0.65 09/20/2015 2209      Component Value Date/Time   CALCIUM 9.6 01/20/2016 1517   CALCIUM 8.7* 09/20/2015 2209   ALKPHOS 49 01/20/2016 1517   AST 68* 01/20/2016 1517   ALT 67* 01/20/2016 1517   BILITOT 0.53 01/20/2016 1517       Lab Results  Component Value Date   WBC 9.8  02/11/2016   HGB 12.1 02/11/2016   HCT 38.6 02/11/2016   MCV 76.2* 02/11/2016   PLT 323 02/11/2016   NEUTROABS 6.0 02/11/2016     ASSESSMENT & PLAN:  Bilateral breast cancer ([place]) Treated with neoadjuvant chemotherapy followed by surgery 04/08/2014, radiation treatment completed 07/17/2014, antiestrogen therapy with anastrozole 09/14/2014 Left breast T2, N0, M0, IDC grade 3; 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 N1 M0 stage IIB  CT chest abdomen pelvis 05/28/2015: Developing subpectoral masses 3.5 cm and 4.5 cm. Four lung nodules the largest 1.4 cm in the right lung 1.7 cm sclerotic right iliac bone lesion concerning for metastatic disease Left subpectoral mass biopsy 06/21/2015: Invasive high-grade ductal carcinoma ER 5%, PR 0%, HER-2 negative ratio 1.29 Prior treatment: Xeloda  1500 mg by mouth twice a day started 07/07/2015 stopped 09/03/2015 (stopped for progression)  Bone metastases: Xgeva every 6 weeks with Calcium + D started January 2017 CT chest abdomen pelvis 09/01/15: Progressive left chest wall recurrence with an enlarging mass and severa; new sub cutaneous nodules. Progressive lung nodules -------------------------------------------------------------------------------------------------------------------------- Current Treatment : Carboplatin day 1 and gemcitabine days 1 and 8 every 3 weeks started 09/10/2015; completed cycle 5 01/20/2016 (carboplatin discontinued for neuropathy); now on a treatment break  Chemotherapy toxicities: 1. Fatigue 2. Anemia due to chemotherapy: hgb improved mildly to 9.0 3. 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 She will only be on gemcitabine days 1 and 8 every 3 weeks Clinically the left chest wall mass continues to shrink.   Pain issues: Oxycontin for long acting pain medication. Pain well controlled Insomnia: D/Ced Ativan and started restoril. Advised to try melatonin 65m as well Lymphedema: physical therapy. Continue to wear compression sleeves daily.  CT CAP 01/26/2016 Marked improvement in size of lung nodules with many of the nodules resolved index right lower lobe nodule 1.9 x 1.8 cm is now 0.7 x 0.6 cm, reduction in the size of left breast mass 7.2 cm down to 2.6 cm, enlargement in the hypodense mass in the uterus.  Patient is planning to undergo hysterectomy because of frequent uterine bleeding.  Patient is on a treatment break and plans to come back at the end of August to resume chemotherapy with gemcitabine.   Orders Placed This Encounter  Procedures  . SCHEDULING COMMUNICATION    Schedule 15 minute injection appointment   The patient has a good understanding of the overall plan. she agrees with  it. she will call with any problems that may develop before the next visit here.   GRulon Eisenmenger MD 02/11/2016

## 2016-02-11 NOTE — Assessment & Plan Note (Signed)
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 N1 M0 stage IIB  CT chest abdomen pelvis 05/28/2015: Developing subpectoral masses 3.5 cm and 4.5 cm. Four lung nodules the largest 1.4 cm in the right lung 1.7 cm sclerotic right iliac bone lesion concerning for metastatic disease Left subpectoral mass biopsy 06/21/2015: Invasive high-grade ductal carcinoma ER 5%, PR 0%, HER-2 negative ratio 1.29 Prior treatment: Xeloda 1500 mg by mouth twice a day started 07/07/2015 stopped 09/03/2015 (stopped for progression)  Bone metastases: Xgeva every 6 weeks with Calcium + D started January 2017 CT chest abdomen pelvis 09/01/15: Progressive left chest wall recurrence with an enlarging mass and severa; new sub cutaneous nodules. Progressive lung nodules -------------------------------------------------------------------------------------------------------------------------- Current Treatment : Carboplatin day 1 and gemcitabine days 1 and 8 every 3 weeks started 09/10/2015; completed cycle 5 01/20/2016 (carboplatin discontinued for neuropathy); now on a treatment break  Chemotherapy toxicities: 1. Fatigue 2. Anemia due to chemotherapy: hgb improved mildly to 9.0 3. 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 She will only be on gemcitabine days 1 and 8 every 3 weeks Clinically the left chest wall mass continues to shrink.   Pain issues: Oxycontin for long acting pain medication. Pain well controlled Insomnia: D/Ced Ativan and started restoril. Advised to  try melatonin 49m as well Lymphedema: physical therapy. Continue to wear compression sleeves daily.  CT CAP 01/26/2016 Marked improvement in size of lung nodules with many of the nodules resolved index right lower lobe nodule 1.9 x 1.8 cm is now 0.7 x 0.6 cm, reduction in the size of left breast mass 7.2 cm down to 2.6 cm, enlargement in the hypodense mass in the uterus  Patient is on a treatment break and plans to come back in about a month to resume chemotherapy with gemcitabine.

## 2016-02-15 ENCOUNTER — Other Ambulatory Visit: Payer: Self-pay

## 2016-02-15 DIAGNOSIS — C50512 Malignant neoplasm of lower-outer quadrant of left female breast: Principal | ICD-10-CM

## 2016-02-15 DIAGNOSIS — C50511 Malignant neoplasm of lower-outer quadrant of right female breast: Secondary | ICD-10-CM

## 2016-02-15 MED ORDER — OXYCODONE-ACETAMINOPHEN 5-325 MG PO TABS: ORAL_TABLET | ORAL | Status: DC

## 2016-02-16 MED FILL — OXYCODONE/APAP 5/325MG: 5-325 | 15 days supply | Qty: 60 | Fill #0

## 2016-03-15 ENCOUNTER — Other Ambulatory Visit: Payer: Self-pay

## 2016-03-15 DIAGNOSIS — C50512 Malignant neoplasm of lower-outer quadrant of left female breast: Principal | ICD-10-CM

## 2016-03-15 DIAGNOSIS — C50511 Malignant neoplasm of lower-outer quadrant of right female breast: Secondary | ICD-10-CM

## 2016-03-15 MED ORDER — OXYCODONE-ACETAMINOPHEN 5-325 MG PO TABS: ORAL_TABLET | ORAL | 0 refills | Status: DC

## 2016-03-16 ENCOUNTER — Telehealth: Payer: Self-pay

## 2016-03-16 MED FILL — OXYCODONE/APAP 5/325MG: 5-325 | 15 days supply | Qty: 60 | Fill #0

## 2016-03-16 NOTE — Telephone Encounter (Signed)
Received VM from pt stating she needed a refill on her Percocet.  Chart reviewed and refill deemed appropriate. Called pt back to inform her that prescription would be available for pick up at her convenience.  Upon further discussion with pt, she reports injuring herself while carrying boxes during recent move.  Pt reports she thinks she pulled something but describes swelling at site "where tumor is" on chest wall.  Pt denies needing to be seen sooner than appointment scheduled for next week.  I told pt I would notify Dr. Lindi Adie of this and if she felt she needed to be seen sooner to call our office to arrange appointment.  Pt verbalized understanding and without further questions or concerns at time of call.  Dr. Lindi Adie aware.

## 2016-03-17 ENCOUNTER — Telehealth: Payer: Self-pay

## 2016-03-17 NOTE — Telephone Encounter (Signed)
Received [facility] [employer] fax from last evening which stated pt called in to discuss cough.  Pt asking if she could take robitussin for this.  Unsure of answer given by [employer].  Called to follow up with pt and obtain more details.  Unable to reach pt or leave VM as phone continuously rings.

## 2016-03-23 ENCOUNTER — Other Ambulatory Visit: Payer: Self-pay

## 2016-03-23 DIAGNOSIS — C50919 Malignant neoplasm of unspecified site of unspecified female breast: Secondary | ICD-10-CM

## 2016-03-24 ENCOUNTER — Other Ambulatory Visit (HOSPITAL_BASED_OUTPATIENT_CLINIC_OR_DEPARTMENT_OTHER): Payer: Medicare Other

## 2016-03-24 ENCOUNTER — Ambulatory Visit (HOSPITAL_BASED_OUTPATIENT_CLINIC_OR_DEPARTMENT_OTHER): Payer: Medicare Other

## 2016-03-24 ENCOUNTER — Encounter: Payer: Self-pay | Admitting: Hematology and Oncology

## 2016-03-24 ENCOUNTER — Ambulatory Visit: Payer: Medicare Other

## 2016-03-24 ENCOUNTER — Ambulatory Visit (HOSPITAL_BASED_OUTPATIENT_CLINIC_OR_DEPARTMENT_OTHER): Payer: Medicare Other | Admitting: Hematology and Oncology

## 2016-03-24 ENCOUNTER — Telehealth: Payer: Self-pay | Admitting: Hematology and Oncology

## 2016-03-24 VITALS — BP 115/66 | HR 89 | Temp 98.1°F | Resp 18 | Wt 140.5 lb

## 2016-03-24 DIAGNOSIS — T451X5A Adverse effect of antineoplastic and immunosuppressive drugs, initial encounter: Secondary | ICD-10-CM

## 2016-03-24 DIAGNOSIS — C50912 Malignant neoplasm of unspecified site of left female breast: Secondary | ICD-10-CM

## 2016-03-24 DIAGNOSIS — Z5111 Encounter for antineoplastic chemotherapy: Secondary | ICD-10-CM

## 2016-03-24 DIAGNOSIS — C7951 Secondary malignant neoplasm of bone: Secondary | ICD-10-CM | POA: Diagnosis not present

## 2016-03-24 DIAGNOSIS — C50919 Malignant neoplasm of unspecified site of unspecified female breast: Secondary | ICD-10-CM

## 2016-03-24 DIAGNOSIS — C50911 Malignant neoplasm of unspecified site of right female breast: Secondary | ICD-10-CM

## 2016-03-24 DIAGNOSIS — C7801 Secondary malignant neoplasm of right lung: Secondary | ICD-10-CM

## 2016-03-24 DIAGNOSIS — Z95828 Presence of other vascular implants and grafts: Secondary | ICD-10-CM

## 2016-03-24 DIAGNOSIS — D6481 Anemia due to antineoplastic chemotherapy: Secondary | ICD-10-CM | POA: Diagnosis not present

## 2016-03-24 DIAGNOSIS — G620 Drug-induced polyneuropathy: Secondary | ICD-10-CM

## 2016-03-24 LAB — COMPREHENSIVE METABOLIC PANEL
ALT: 42 U/L (ref 0–55)
AST: 49 U/L — AB (ref 5–34)
Albumin: 3.2 g/dL — ABNORMAL LOW (ref 3.5–5.0)
Alkaline Phosphatase: 58 U/L (ref 40–150)
Anion Gap: 11 mEq/L (ref 3–11)
BUN: 13.1 mg/dL (ref 7.0–26.0)
CHLORIDE: 106 meq/L (ref 98–109)
CO2: 23 meq/L (ref 22–29)
CREATININE: 1 mg/dL (ref 0.6–1.1)
Calcium: 9.7 mg/dL (ref 8.4–10.4)
EGFR: 71 mL/min/{1.73_m2} — ABNORMAL LOW (ref >90)
GLUCOSE: 258 mg/dL — AB (ref 70–140)
Potassium: 4.2 mEq/L (ref 3.5–5.1)
SODIUM: 140 meq/L (ref 136–145)
Total Bilirubin: 0.71 mg/dL (ref 0.20–1.20)
Total Protein: 7.2 g/dL (ref 6.4–8.3)

## 2016-03-24 LAB — CBC WITH DIFFERENTIAL/PLATELET
BASO%: 0.6 % (ref 0.0–2.0)
Basophils Absolute: 0.1 10*3/uL (ref 0.0–0.1)
EOS%: 3.8 % (ref 0.0–7.0)
Eosinophils Absolute: 0.3 10*3/uL (ref 0.0–0.5)
HEMATOCRIT: 37.7 % (ref 34.8–46.6)
HGB: 11.7 g/dL (ref 11.6–15.9)
LYMPH#: 2.2 10*3/uL (ref 0.9–3.3)
LYMPH%: 25.2 % (ref 14.0–49.7)
MCH: 23.2 pg — ABNORMAL LOW (ref 25.1–34.0)
MCHC: 30.9 g/dL — AB (ref 31.5–36.0)
MCV: 75.1 fL — ABNORMAL LOW (ref 79.5–101.0)
MONO#: 0.5 10*3/uL (ref 0.1–0.9)
MONO%: 6.1 % (ref 0.0–14.0)
NEUT#: 5.7 10*3/uL (ref 1.5–6.5)
NEUT%: 64.3 % (ref 38.4–76.8)
Platelets: 214 10*3/uL (ref 145–400)
RBC: 5.02 10*6/uL (ref 3.70–5.45)
RDW: 14.7 % — ABNORMAL HIGH (ref 11.2–14.5)
WBC: 8.8 10*3/uL (ref 3.9–10.3)

## 2016-03-24 MED ORDER — DENOSUMAB 120 MG/1.7ML ~~LOC~~ SOLN
120.0000 mg | Freq: Once | SUBCUTANEOUS | Status: AC
  Administered 2016-03-24: 120 mg via SUBCUTANEOUS
  Filled 2016-03-24: qty 1.7

## 2016-03-24 MED ORDER — PROCHLORPERAZINE MALEATE 10 MG PO TABS
10.0000 mg | ORAL_TABLET | Freq: Once | ORAL | Status: AC
  Administered 2016-03-24: 10 mg via ORAL

## 2016-03-24 MED ORDER — HEPARIN SOD (PORK) LOCK FLUSH 100 UNIT/ML IV SOLN
500.0000 [IU] | Freq: Once | INTRAVENOUS | Status: AC | PRN
  Administered 2016-03-24: 500 [IU]
  Filled 2016-03-24: qty 5

## 2016-03-24 MED ORDER — SODIUM CHLORIDE 0.9 % IV SOLN
Freq: Once | INTRAVENOUS | Status: AC
Start: 2016-03-24 — End: 2016-03-24
  Administered 2016-03-24: 11:00:00 via INTRAVENOUS

## 2016-03-24 MED ORDER — SODIUM CHLORIDE 0.9 % IJ SOLN
10.0000 mL | INTRAMUSCULAR | Status: DC | PRN
  Administered 2016-03-24: 10 mL via INTRAVENOUS
  Filled 2016-03-24: qty 10

## 2016-03-24 MED ORDER — SODIUM CHLORIDE 0.9 % IV SOLN
650.0000 mg/m2 | Freq: Once | INTRAVENOUS | Status: AC
  Administered 2016-03-24: 1064 mg via INTRAVENOUS
  Filled 2016-03-24: qty 27.98

## 2016-03-24 MED ORDER — SODIUM CHLORIDE 0.9% FLUSH
10.0000 mL | INTRAVENOUS | Status: DC | PRN
  Administered 2016-03-24: 10 mL
  Filled 2016-03-24: qty 10

## 2016-03-24 NOTE — Assessment & Plan Note (Signed)
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 N1 M0 stage IIB  CT chest abdomen pelvis 05/28/2015: Developing subpectoral masses 3.5 cm and 4.5 cm. Four lung nodules the largest 1.4 cm in the right lung 1.7 cm sclerotic right iliac bone lesion concerning for metastatic disease Left subpectoral mass biopsy 06/21/2015: Invasive high-grade ductal carcinoma ER 5%, PR 0%, HER-2 negative ratio 1.29 Prior treatment: Xeloda 1500 mg by mouth twice a day started 07/07/2015 stopped 09/03/2015 (stopped for progression)  Bone metastases: Xgeva every 6 weeks with Calcium + D started January 2017 CT chest abdomen pelvis 09/01/15: Progressive left chest wall recurrence with an enlarging mass and severa; new sub cutaneous nodules. Progressive lung nodules -------------------------------------------------------------------------------------------------------------------------- Current Treatment : Carboplatin day 1 and gemcitabine days 1 and 8 every 3 weeks started 09/10/2015; completed cycle 5 01/20/2016 (carboplatin discontinued for neuropathy); now on a treatment break; today start cycle 6   Chemotherapy toxicities: 1. Fatigue 2. Anemia due to chemotherapy: hgb improved mildly to 9.0 3. 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 She will only be on gemcitabine days 1 and 8 every 3 weeks Clinically the left chest wall mass continues to shrink.   Pain issues: Oxycontin for long acting pain medication. Pain well controlled Insomnia: D/Ced Ativan and  started restoril. Advised to try melatonin 63m as well Lymphedema: physical therapy. Continue to wear compression sleeves daily.  CT CAP 01/26/2016 Marked improvement in size of lung nodules with many of the nodules resolved index right lower lobe nodule 1.9 x 1.8 cm is now 0.7 x 0.6 cm, reduction in the size of left breast mass 7.2 cm down to 2.6 cm, enlargement in the hypodense mass in the uterus.  Patient is planning to undergo hysterectomy because of frequent uterine bleeding.  Patient was on a treatment break and is back to resume chemotherapy with gemcitabine.

## 2016-03-24 NOTE — Telephone Encounter (Signed)
appt made and avs printed °

## 2016-03-24 NOTE — Progress Notes (Signed)
Patient Care Team: [name], MD as PCP - General (Family Medicine) [name], MD (Cardiology)  DIAGNOSIS: Bilateral breast cancer [facility])   Staging form: Breast, AJCC 7th Edition   - Clinical: Stage IIB (T2, N1, cM0) - Unsigned         Staging comments: Staged at breast conference 08/13/13    - Pathologic: No stage assigned - Unsigned  SUMMARY OF ONCOLOGIC HISTORY:   Bilateral breast cancer ([place])   07/23/2013 Mammogram    Bilateral breast masses. 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 N1 M0 stage IIB ER 100%, PR 0%, HER-2       06/17/2014 -  Radiation Therapy    Adjuvant radiation therapy      09/14/2014 -  Anti-estrogen oral therapy    Anastrozole 1 mg daily      05/28/2015 Imaging    CT scans: Enlarging subpectoral masses 3.1 x 3.5 cm, posteriorly lower density mass 4.5 x 2.1 cm, several right-sided lung nodules right lower lobe 1.4 cm, 3 other right lung nodules, 1.7 cm right iliac bone lesion      06/21/2015 Procedure    Left subpectoral mass biopsy: Invasive high-grade ductal carcinoma ER 5%, PR 0%, HER-2 negative ratio 1.29      09/01/2015 Imaging    Left chest wall mass increased in size 7.2 x 5.1 cm, multiple subcutaneous nodules, increase in the lung nodules both in number as well as in the size of existing nodules      09/10/2015 -  Chemotherapy    Carboplatin, gemcitabine days 1 and 8 q 3 weeks      01/26/2016 Imaging    Marked improvement in size of lung nodules with many of the nodules resolved index right lower lobe nodule 1.9 x 1.8 cm is now 0.7 x 0.6 cm, reduction in the size of left breast mass 7.2 cm down to 2.6 cm, enlargement in the hypodense mass in the uterus       CHIEF COMPLIANT: Patient here to resume gemcitabine  INTERVAL HISTORY: Paula Navarro is a 65 year old lady with above-mentioned history left chest wall mass currently on palliative chemotherapy with gemcitabine. She took a 2 month break from treatment. She is here back to resume her chemotherapy. She reports that the left chest wall mass is slightly gotten bigger with more swelling and occasional discomfort. She is not clear if the  pain is related to the mass are because she has been moving a lot of boxes since she finally has a very nice house that she is enjoying.  REVIEW OF SYSTEMS:   Constitutional: Denies fevers, chills or abnormal weight loss Eyes: Denies blurriness of vision Ears, nose, mouth, throat, and face: Denies mucositis or sore throat Respiratory: Denies cough, dyspnea or wheezes Cardiovascular: Denies palpitation, chest discomfort Gastrointestinal:  Denies nausea, heartburn or change in bowel habits Skin: Denies abnormal skin rashes Lymphatics: Denies new lymphadenopathy or easy bruising Neurological:Denies numbness, tingling or new weaknesses Behavioral/Psych: Mood is stable, no new changes  Extremities: No lower extremity edema Breast: Left chest wall mass subpectoral All other  systems were reviewed with the patient and are negative.  I have reviewed the past medical history, past surgical history, social history and family history with the patient and they are unchanged from previous note.  ALLERGIES:  is allergic to codeine and morphine and related.  MEDICATIONS:  Current Outpatient Prescriptions  Medication Sig Dispense Refill  . B Complex-C (SUPER B COMPLEX PO) Take 1 tablet by mouth daily.    . calcium-vitamin D (OSCAL-500) 500-400 MG-UNIT tablet Take 1 tablet by mouth 2 (two) times daily. 60 tablet 3  . docusate sodium (COLACE) 100 MG capsule Take 1 capsule (100 mg total) by mouth every 12 (twelve) hours. 60 capsule 0  . gabapentin (NEURONTIN) 300 MG capsule TAKE 2 CAPSULES (600 MG TOTAL) BY MOUTH 3 (THREE) TIMES DAILY. 180 capsule 5  . oxyCODONE (OXYCONTIN) 10 mg 12 hr tablet Take 1 tablet (10 mg total) by mouth every 12 (twelve) hours. 60 tablet 0  . oxyCODONE-acetaminophen (PERCOCET/ROXICET) 5-325 MG tablet 1 tabs PO q6h prn pain 60 tablet 0  . traZODone (DESYREL) 50 MG tablet Take 1 tablet (50 mg total) by mouth at bedtime. 7 tablet 0  . zolpidem (AMBIEN) 10 MG tablet Take 1 tablet (10 mg total) by mouth at bedtime as needed for sleep. 30 tablet 2   No current facility-administered medications for this visit.     PHYSICAL EXAMINATION: ECOG PERFORMANCE STATUS: 1 - Symptomatic but completely ambulatory  Vitals:   03/24/16 0944  BP: 115/66  Pulse: 89  Resp: 18  Temp: 98.1 F (36.7 C)   Filed Weights   03/24/16 0944  Weight: 140 lb 8 oz (63.7 kg)    GENERAL:alert, no distress and comfortable SKIN: skin color, texture, turgor are normal, no rashes or significant lesions EYES: normal, Conjunctiva are pink and non-injected, sclera clear OROPHARYNX:no exudate, no erythema and lips, buccal mucosa, and tongue normal  NECK: supple, thyroid normal size, non-tender, without nodularity LYMPH:  no palpable lymphadenopathy in the cervical, axillary or  inguinal LUNGS: clear to auscultation and percussion with normal breathing effort HEART: regular rate & rhythm and no murmurs and no lower extremity edema ABDOMEN:abdomen soft, non-tender and normal bowel sounds MUSCULOSKELETAL:no cyanosis of digits and no clubbing  NEURO: alert & oriented x 3 with fluent speech, no focal motor/sensory deficits EXTREMITIES: No lower extremity edema BREAST:Left chest wall mass 5 cm in size (exam performed in the presence of a chaperone)  LABORATORY DATA:  I have reviewed the data as listed   Chemistry      Component Value Date/Time   NA 140 02/11/2016 1112   K 4.0 02/11/2016 1112   CL 99 (L) 09/20/2015 2209   CO2 23 02/11/2016 1112   BUN 12.0 02/11/2016 1112   CREATININE 0.9 02/11/2016 1112      Component  Value Date/Time   CALCIUM 9.8 02/11/2016 1112   ALKPHOS 54 02/11/2016 1112   AST 71 (H) 02/11/2016 1112   ALT 58 (H) 02/11/2016 1112   BILITOT 0.69 02/11/2016 1112       Lab Results  Component Value Date   WBC 8.8 03/24/2016   HGB 11.7 03/24/2016   HCT 37.7 03/24/2016   MCV 75.1 (L) 03/24/2016   PLT 214 03/24/2016   NEUTROABS 5.7 03/24/2016     ASSESSMENT & PLAN:  Bilateral breast cancer ([place]) Treated with neoadjuvant chemotherapy followed by surgery 04/08/2014, radiation treatment completed 07/17/2014, antiestrogen therapy with anastrozole 09/14/2014 Left breast T2, N0, M0, IDC grade 3; 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 N1 M0 stage IIB  CT chest abdomen pelvis 05/28/2015: Developing subpectoral masses 3.5 cm and 4.5 cm. Four lung nodules the largest 1.4 cm in the right lung 1.7 cm sclerotic right iliac bone lesion concerning for metastatic disease Left subpectoral mass biopsy 06/21/2015: Invasive high-grade ductal carcinoma ER 5%, PR 0%, HER-2 negative ratio  1.29 Prior treatment: Xeloda 1500 mg by mouth twice a day started 07/07/2015 stopped 09/03/2015 (stopped for progression)  Bone metastases: Xgeva every 6 weeks with Calcium + D started January 2017 CT chest abdomen pelvis 09/01/15: Progressive left chest wall recurrence with an enlarging mass and severa; new sub cutaneous nodules. Progressive lung nodules -------------------------------------------------------------------------------------------------------------------------- Current Treatment : Carboplatin day 1 and gemcitabine days 1 and 8 every 3 weeks started 09/10/2015; completed cycle 5 01/20/2016 (carboplatin discontinued for neuropathy); now on a treatment break; today start cycle 6   Chemotherapy toxicities: 1. Fatigue 2. Anemia due to chemotherapy: hgb improved mildly to 9.0 3. 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 She will only be on gemcitabine days 1 and 8 every 3 weeks Clinically the left chest wall mass continues to shrink.   Pain issues: Oxycontin for long acting pain medication. Pain well controlled Insomnia: D/Ced Ativan and started restoril. Advised to try melatonin 60m as well Lymphedema: physical therapy. Continue to wear compression sleeves daily.  CT CAP 01/26/2016 Marked improvement in size of lung nodules with many of the nodules resolved index right lower lobe nodule 1.9 x 1.8 cm is now 0.7 x 0.6 cm, reduction in the size of left breast mass 7.2 cm down to 2.6 cm, enlargement in the hypodense mass in the uterus.  Patient is planning to undergo hysterectomy because of frequent uterine bleeding.  Patient was on a treatment break and is back to resume chemotherapy with gemcitabine. Return to clinic in 3 weeks for cycle 7  No orders of the defined types were placed in this encounter.  The patient has a good understanding of the overall plan. she  agrees with it. she will call with any problems that may develop before the next visit here.   GRulon Eisenmenger MD 03/24/16

## 2016-03-24 NOTE — Progress Notes (Signed)
Late entry for 1040: Pt reported "bump" on scalp that is tender "when she hits it." Area is slightly indurated, resembles a bug bite or pimple. No exudate noted. Recommended she apply warm compresses to area. Pt reports Dr. Lindi Adie has seen it in the past. Will make him aware it is still present.

## 2016-03-24 NOTE — Patient Instructions (Signed)

## 2016-03-24 NOTE — Patient Instructions (Signed)
[facility] Discharge Instructions for Patients Receiving Chemotherapy  Today you received the following chemotherapy agents: Gemzar  To help prevent nausea and vomiting after your treatment, we encourage you to take your nausea medication: Compazine. Take one every 6 hours as needed.  If you develop nausea and vomiting that is not controlled by your nausea medication, call the clinic.   BELOW ARE SYMPTOMS THAT SHOULD BE REPORTED IMMEDIATELY:  *FEVER GREATER THAN 100.5 F  *CHILLS WITH OR WITHOUT FEVER  NAUSEA AND VOMITING THAT IS NOT CONTROLLED WITH YOUR NAUSEA MEDICATION  *UNUSUAL SHORTNESS OF BREATH  *UNUSUAL BRUISING OR BLEEDING  TENDERNESS IN MOUTH AND THROAT WITH OR WITHOUT PRESENCE OF ULCERS  *URINARY PROBLEMS  *BOWEL PROBLEMS  UNUSUAL RASH Items with * indicate a potential emergency and should be followed up as soon as possible.  Feel free to call the clinic should you have any questions or concerns. The clinic phone number is [phone].  Please show the CHEMO ALERT CARD at check-in to the Emergency Department and triage nurse.   

## 2016-03-31 ENCOUNTER — Ambulatory Visit (HOSPITAL_BASED_OUTPATIENT_CLINIC_OR_DEPARTMENT_OTHER): Payer: Medicare Other

## 2016-03-31 ENCOUNTER — Other Ambulatory Visit (HOSPITAL_BASED_OUTPATIENT_CLINIC_OR_DEPARTMENT_OTHER): Payer: Medicare Other

## 2016-03-31 ENCOUNTER — Ambulatory Visit: Payer: Medicare Other

## 2016-03-31 ENCOUNTER — Ambulatory Visit (HOSPITAL_BASED_OUTPATIENT_CLINIC_OR_DEPARTMENT_OTHER): Payer: Medicare Other | Admitting: Nurse Practitioner

## 2016-03-31 VITALS — BP 117/66 | HR 68 | Temp 98.8°F | Resp 18

## 2016-03-31 DIAGNOSIS — C50919 Malignant neoplasm of unspecified site of unspecified female breast: Secondary | ICD-10-CM

## 2016-03-31 DIAGNOSIS — Z95828 Presence of other vascular implants and grafts: Secondary | ICD-10-CM

## 2016-03-31 DIAGNOSIS — C50911 Malignant neoplasm of unspecified site of right female breast: Secondary | ICD-10-CM

## 2016-03-31 DIAGNOSIS — C50912 Malignant neoplasm of unspecified site of left female breast: Secondary | ICD-10-CM

## 2016-03-31 DIAGNOSIS — C7951 Secondary malignant neoplasm of bone: Secondary | ICD-10-CM | POA: Diagnosis not present

## 2016-03-31 DIAGNOSIS — L989 Disorder of the skin and subcutaneous tissue, unspecified: Secondary | ICD-10-CM

## 2016-03-31 DIAGNOSIS — Z5111 Encounter for antineoplastic chemotherapy: Secondary | ICD-10-CM

## 2016-03-31 DIAGNOSIS — N63 Unspecified lump in breast: Secondary | ICD-10-CM

## 2016-03-31 DIAGNOSIS — R739 Hyperglycemia, unspecified: Secondary | ICD-10-CM

## 2016-03-31 DIAGNOSIS — C50511 Malignant neoplasm of lower-outer quadrant of right female breast: Secondary | ICD-10-CM

## 2016-03-31 DIAGNOSIS — C50512 Malignant neoplasm of lower-outer quadrant of left female breast: Principal | ICD-10-CM

## 2016-03-31 LAB — COMPREHENSIVE METABOLIC PANEL
ALT: 76 U/L — ABNORMAL HIGH (ref 0–55)
AST: 95 U/L — ABNORMAL HIGH (ref 5–34)
Albumin: 3.2 g/dL — ABNORMAL LOW (ref 3.5–5.0)
Alkaline Phosphatase: 63 U/L (ref 40–150)
Anion Gap: 11 mEq/L (ref 3–11)
BUN: 16.2 mg/dL (ref 7.0–26.0)
CHLORIDE: 101 meq/L (ref 98–109)
CO2: 25 meq/L (ref 22–29)
Calcium: 9.8 mg/dL (ref 8.4–10.4)
Creatinine: 1.1 mg/dL (ref 0.6–1.1)
EGFR: 63 mL/min/{1.73_m2} — AB (ref >90)
GLUCOSE: 322 mg/dL — AB (ref 70–140)
POTASSIUM: 4.1 meq/L (ref 3.5–5.1)
SODIUM: 137 meq/L (ref 136–145)
Total Bilirubin: 0.41 mg/dL (ref 0.20–1.20)
Total Protein: 7.5 g/dL (ref 6.4–8.3)

## 2016-03-31 LAB — CBC WITH DIFFERENTIAL/PLATELET
BASO%: 0.6 % (ref 0.0–2.0)
Basophils Absolute: 0 10*3/uL (ref 0.0–0.1)
EOS%: 1.4 % (ref 0.0–7.0)
Eosinophils Absolute: 0.1 10*3/uL (ref 0.0–0.5)
HCT: 36 % (ref 34.8–46.6)
HGB: 11 g/dL — ABNORMAL LOW (ref 11.6–15.9)
LYMPH#: 2.2 10*3/uL (ref 0.9–3.3)
LYMPH%: 42.5 % (ref 14.0–49.7)
MCH: 22.9 pg — AB (ref 25.1–34.0)
MCHC: 30.6 g/dL — AB (ref 31.5–36.0)
MCV: 75 fL — AB (ref 79.5–101.0)
MONO#: 0.6 10*3/uL (ref 0.1–0.9)
MONO%: 10.8 % (ref 0.0–14.0)
NEUT%: 44.7 % (ref 38.4–76.8)
NEUTROS ABS: 2.3 10*3/uL (ref 1.5–6.5)
Platelets: 182 10*3/uL (ref 145–400)
RBC: 4.8 10*6/uL (ref 3.70–5.45)
RDW: 13.4 % (ref 11.2–14.5)
WBC: 5.2 10*3/uL (ref 3.9–10.3)

## 2016-03-31 MED ORDER — OXYCODONE HCL ER 10 MG PO T12A: 10.0000 mg | EXTENDED_RELEASE_TABLET | Freq: Two times a day (BID) | ORAL | 0 refills | Status: DC

## 2016-03-31 MED ORDER — SODIUM CHLORIDE 0.9 % IV SOLN
650.0000 mg/m2 | Freq: Once | INTRAVENOUS | Status: AC
  Administered 2016-03-31: 1064 mg via INTRAVENOUS
  Filled 2016-03-31: qty 27.98

## 2016-03-31 MED ORDER — SODIUM CHLORIDE 0.9% FLUSH
10.0000 mL | INTRAVENOUS | Status: DC | PRN
  Administered 2016-03-31: 10 mL
  Filled 2016-03-31: qty 10

## 2016-03-31 MED ORDER — OXYCODONE-ACETAMINOPHEN 5-325 MG PO TABS: ORAL_TABLET | ORAL | 0 refills | Status: DC

## 2016-03-31 MED ORDER — SODIUM CHLORIDE 0.9 % IV SOLN
Freq: Once | INTRAVENOUS | Status: AC
  Administered 2016-03-31: 13:00:00 via INTRAVENOUS

## 2016-03-31 MED ORDER — SODIUM CHLORIDE 0.9 % IJ SOLN
10.0000 mL | INTRAMUSCULAR | Status: DC | PRN
  Administered 2016-03-31: 10 mL via INTRAVENOUS
  Filled 2016-03-31: qty 10

## 2016-03-31 MED ORDER — HEPARIN SOD (PORK) LOCK FLUSH 100 UNIT/ML IV SOLN
500.0000 [IU] | Freq: Once | INTRAVENOUS | Status: AC | PRN
  Administered 2016-03-31: 500 [IU]
  Filled 2016-03-31: qty 5

## 2016-03-31 MED ORDER — PROCHLORPERAZINE MALEATE 10 MG PO TABS
10.0000 mg | ORAL_TABLET | Freq: Once | ORAL | Status: AC
  Administered 2016-03-31: 10 mg via ORAL

## 2016-03-31 MED FILL — OXYCODONE/APAP 5/325MG: 5-325 | 15 days supply | Qty: 60 | Fill #0

## 2016-03-31 MED FILL — OxyCONTIN 10 MG T12A: 10 | 30 days supply | Qty: 60 | Fill #0

## 2016-03-31 NOTE — Patient Instructions (Signed)

## 2016-03-31 NOTE — Patient Instructions (Signed)
[facility] Discharge Instructions for Patients Receiving Chemotherapy  Today you received the following chemotherapy agents: Gemzar  To help prevent nausea and vomiting after your treatment, we encourage you to take your nausea medication: Compazine. Take one every 6 hours as needed.  If you develop nausea and vomiting that is not controlled by your nausea medication, call the clinic.   BELOW ARE SYMPTOMS THAT SHOULD BE REPORTED IMMEDIATELY:  *FEVER GREATER THAN 100.5 F  *CHILLS WITH OR WITHOUT FEVER  NAUSEA AND VOMITING THAT IS NOT CONTROLLED WITH YOUR NAUSEA MEDICATION  *UNUSUAL SHORTNESS OF BREATH  *UNUSUAL BRUISING OR BLEEDING  TENDERNESS IN MOUTH AND THROAT WITH OR WITHOUT PRESENCE OF ULCERS  *URINARY PROBLEMS  *BOWEL PROBLEMS  UNUSUAL RASH Items with * indicate a potential emergency and should be followed up as soon as possible.  Feel free to call the clinic should you have any questions or concerns. The clinic phone number is [phone].  Please show the CHEMO ALERT CARD at check-in to the Emergency Department and triage nurse.   

## 2016-03-31 NOTE — Progress Notes (Unsigned)
OK to treat with elevated AST and ALT, per Dr. Lindi Adie.

## 2016-04-01 ENCOUNTER — Encounter: Payer: Self-pay | Admitting: Nurse Practitioner

## 2016-04-01 ENCOUNTER — Other Ambulatory Visit: Payer: Self-pay | Admitting: Nurse Practitioner

## 2016-04-01 DIAGNOSIS — C50912 Malignant neoplasm of unspecified site of left female breast: Principal | ICD-10-CM

## 2016-04-01 DIAGNOSIS — L989 Disorder of the skin and subcutaneous tissue, unspecified: Secondary | ICD-10-CM | POA: Insufficient documentation

## 2016-04-01 DIAGNOSIS — C50911 Malignant neoplasm of unspecified site of right female breast: Secondary | ICD-10-CM

## 2016-04-01 DIAGNOSIS — R739 Hyperglycemia, unspecified: Secondary | ICD-10-CM | POA: Insufficient documentation

## 2016-04-01 NOTE — Assessment & Plan Note (Signed)
Patient reports that she has had a scalp lesion to the right side of her scalp for the past several weeks.  She denies any tenderness or itching to the lesion.  Exam today reveals a firm lesion to the right side of the scalp.  The area has no erythema, warmth, or red streaks.  There is also no edema or tenderness to the site.  Reviewed all findings with Dr. Lindi Adie; and he recommended ordering a CT of the head for further evaluation.

## 2016-04-01 NOTE — Progress Notes (Signed)
SYMPTOM MANAGEMENT CLINIC    Chief Complaint: Breast pain, scalp lesion  HPI:  Paula Navarro 65 y.o. female diagnosed with breast cancer; with both lung and bone metastasis.  Currently undergoing gemcitabine chemotherapy regimen.     Bilateral breast cancer ([place])   07/23/2013 Mammogram    Bilateral breast masses. 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 N1 M0 stage IIB ER 100%, PR 0%, HER-2       06/17/2014 -  Radiation Therapy    Adjuvant radiation therapy      09/14/2014 -  Anti-estrogen oral therapy    Anastrozole 1 mg daily      05/28/2015 Imaging    CT scans: Enlarging subpectoral masses 3.1 x 3.5 cm, posteriorly lower density mass 4.5 x 2.1 cm, several right-sided lung nodules right lower lobe 1.4 cm, 3 other right lung nodules, 1.7 cm right iliac bone lesion      06/21/2015 Procedure    Left subpectoral mass biopsy: Invasive high-grade ductal carcinoma ER 5%, PR 0%, HER-2 negative ratio 1.29      09/01/2015  Imaging    Left chest wall mass increased in size 7.2 x 5.1 cm, multiple subcutaneous nodules, increase in the lung nodules both in number as well as in the size of existing nodules      09/10/2015 -  Chemotherapy    Carboplatin, gemcitabine days 1 and 8 q 3 weeks      01/26/2016 Imaging    Marked improvement in size of lung nodules with many of the nodules resolved index right lower lobe nodule 1.9 x 1.8 cm is now 0.7 x 0.6 cm, reduction in the size of left breast mass 7.2 cm down to 2.6 cm, enlargement in the hypodense mass in the uterus       Review of Systems  Cardiovascular:       Left breast mass with pain  Skin:       Right sided scalp lesion  All other systems reviewed and are negative.   Past Medical History:  Diagnosis Date  . Anemia   . Anxiety   . Atrial septal defect 1996   Surgical repair in 1996  . Breast cancer ([place])   . Chest pain    Admitted to [facility] in 09/2011; refused stress test  . Chronic bronchitis   . Chronic pain   . COPD (chronic obstructive pulmonary disease) (  [place])    on xray  . Palpitation    Tachycardia reported by monitor clerk during a symptomatic spell  . Radiation 06/30/14-08/17/14   Bilateral Breast  . Tobacco abuse    60 pack years; 1.5 packs per day  . Wears dentures    top    Past Surgical History:  Procedure Laterality Date  . ASD REPAIR, OSTIUM PRIMUM  1996   dr Roxy Horseman  . AXILLARY LYMPH NODE DISSECTION Bilateral 04/08/2014   Procedure:  BILATERAL AXILLARY LYMPH NODE DISSECTION;  Surgeon: Autumn Messing III, MD;  Location: [facility];  Service: General;  Laterality: Bilateral;  . BREAST BIOPSY Bilateral   . BREAST LUMPECTOMY WITH RADIOACTIVE SEED LOCALIZATION Bilateral 04/08/2014   Procedure: BILATERAL  RADIOACTIVE SEED LOCALIZATION LUMPECTOMY ;  Surgeon: Autumn Messing III, MD;  Location: [place];  Service: General;  Laterality: Bilateral;  . CESAREAN SECTION     x3  . CHOLECYSTECTOMY    . open heart surgery     . PORT A CATH REVISION  1/15   put in   . TUBAL LIGATION      has Chronic bronchitis; Tobacco abuse; Laboratory test; Palpitation; Chest pain; Atrial septal defect; Bilateral breast cancer ([place]); Neuropathy due to chemotherapeutic drug ([place]); Hand foot syndrome; Anxiety; Suspected herpes zoster left C5 distribution; Postherpetic neuralgia; Lymphedema; Rash; Vaginal bleeding; Chronic pain; Metastatic breast cancer ([place]); Encounter for chemotherapy management; Bone metastases ([place]); Lung metastases ([place]); Microcytic anemia; Insomnia; Port catheter in place; Encounter for central line care; Hyperglycemia; and Scalp lesion on her problem list.    is allergic to codeine and morphine and related.    Medication List       Accurate as of 03/31/16 11:59 PM. Always use your most recent med list.          calcium-vitamin D 500-400 MG-UNIT tablet Commonly known as:  OSCAL-500 Take 1 tablet by mouth 2 (two) times daily.   docusate sodium 100 MG capsule Commonly known as:  COLACE Take 1 capsule (100 mg total) by mouth every 12 (twelve) hours.   gabapentin 300 MG capsule Commonly known as:  NEURONTIN TAKE 2 CAPSULES (600 MG TOTAL) BY MOUTH 3 (THREE) TIMES DAILY.   oxyCODONE 10 mg 12 hr tablet Commonly known as:  OXYCONTIN Take 1 tablet (10 mg total) by mouth every 12 (twelve) hours.   oxyCODONE-acetaminophen 5-325 MG tablet Commonly known as:  PERCOCET/ROXICET 1 tabs PO q6h prn pain   SUPER B COMPLEX PO Take 1 tablet by mouth daily.   traZODone 50 MG tablet Commonly known as:  DESYREL Take 1 tablet (50 mg total) by mouth at bedtime.   zolpidem 10 MG tablet Commonly known as:  AMBIEN Take 1 tablet (10 mg total) by mouth at bedtime as needed for sleep.        PHYSICAL EXAMINATION  Oncology Vitals 03/31/2016 03/24/2016  Height - -  Weight - 63.73 kg  Weight (lbs) - 140 lbs 8 oz  BMI (kg/m2) - 25.7 kg/m2  Temp 98.8 98.1  Pulse 68 89  Resp 18 18  SpO2 99 100  BSA (m2) - 1.67  m2   BP Readings from Last 2 Encounters:  03/31/16 117/66  03/24/16 115/66    Physical Exam  Constitutional: She is oriented to person, place, and time and well-developed, well-nourished, and in no distress.  HENT:  Head: Normocephalic and atraumatic.  Eyes: Conjunctivae and EOM are normal. Pupils are equal, round, and reactive to light. Right  eye exhibits no discharge. Left eye exhibits no discharge. No scleral icterus.  Neck: Normal range of motion. Neck supple.  Pulmonary/Chest: Effort normal. No respiratory distress.  Musculoskeletal: Normal range of motion. She exhibits no edema or tenderness.  Neurological: She is alert and oriented to person, place, and time. Gait normal.  Skin: Skin is warm and dry. No rash noted. No erythema. No pallor.  Patient has a right parietal scalp lesion with some tenderness.  The lesion is firm as well.  Also, patient has left breast mass to the left outer breast that extends to the left axilla region.  This area is tender as well.  Psychiatric: Affect normal.  Nursing note and vitals reviewed.   LABORATORY DATA:. Appointment on 03/31/2016  Component Date Value Ref Range Status  . WBC 03/31/2016 5.2  3.9 - 10.3 10e3/uL Final  . NEUT# 03/31/2016 2.3  1.5 - 6.5 10e3/uL Final  . HGB 03/31/2016 11.0* 11.6 - 15.9 g/dL Final  . HCT 03/31/2016 36.0  34.8 - 46.6 % Final  . Platelets 03/31/2016 182  145 - 400 10e3/uL Final  . MCV 03/31/2016 75.0* 79.5 - 101.0 fL Final  . MCH 03/31/2016 22.9* 25.1 - 34.0 pg Final  . MCHC 03/31/2016 30.6* 31.5 - 36.0 g/dL Final  . RBC 03/31/2016 4.80  3.70 - 5.45 10e6/uL Final  . RDW 03/31/2016 13.4  11.2 - 14.5 % Final  . lymph# 03/31/2016 2.2  0.9 - 3.3 10e3/uL Final  . MONO# 03/31/2016 0.6  0.1 - 0.9 10e3/uL Final  . Eosinophils Absolute 03/31/2016 0.1  0.0 - 0.5 10e3/uL Final  . Basophils Absolute 03/31/2016 0.0  0.0 - 0.1 10e3/uL Final  . NEUT% 03/31/2016 44.7  38.4 - 76.8 % Final  . LYMPH% 03/31/2016 42.5  14.0  - 49.7 % Final  . MONO% 03/31/2016 10.8  0.0 - 14.0 % Final  . EOS% 03/31/2016 1.4  0.0 - 7.0 % Final  . BASO% 03/31/2016 0.6  0.0 - 2.0 % Final  . Sodium 03/31/2016 137  136 - 145 mEq/L Final  . Potassium 03/31/2016 4.1  3.5 - 5.1 mEq/L Final  . Chloride 03/31/2016 101  98 - 109 mEq/L Final  . CO2 03/31/2016 25  22 - 29 mEq/L Final  . Glucose 03/31/2016 322* 70 - 140 mg/dl Final  . BUN 03/31/2016 16.2  7.0 - 26.0 mg/dL Final  . Creatinine 03/31/2016 1.1  0.6 - 1.1 mg/dL Final  . Total Bilirubin 03/31/2016 0.41  0.20 - 1.20 mg/dL Final  . Alkaline Phosphatase 03/31/2016 63  40 - 150 U/L Final  . AST 03/31/2016 95* 5 - 34 U/L Final  . ALT 03/31/2016 76* 0 - 55 U/L Final  . Total Protein 03/31/2016 7.5  6.4 - 8.3 g/dL Final  . Albumin 03/31/2016 3.2* 3.5 - 5.0 g/dL Final  . Calcium 03/31/2016 9.8  8.4 - 10.4 mg/dL Final  . Anion Gap 03/31/2016 11  3 - 11 mEq/L Final  . EGFR 03/31/2016 63* >90 ml/min/1.73 m2 Final    RADIOGRAPHIC STUDIES: No results found.  ASSESSMENT/PLAN:    Scalp lesion Patient reports that she has had a scalp lesion to the right side of her scalp for the past several weeks.  She denies any tenderness or itching to the lesion.  Exam today reveals a firm lesion to the right side of the scalp.  The area has no erythema, warmth, or red streaks.  There is also no edema or tenderness to the site.  Reviewed  all findings with Dr. Lindi Adie; and he recommended ordering a CT of the head for further evaluation.  Hyperglycemia Patient's blood sugar was elevated to 322 today.  Patient has no history of diabetes.  Will continue to monitor patient's blood sugar closely.  Also, patient was recommended to check her blood sugar at home if at all possible.  Bilateral breast cancer [facility]) Patient presented to the [facility] today.  Receive cycle 7, day 8 of her gemcitabine chemotherapy regimen.  She presented to the [facility] today with complaint of increased mass to the left  upper outer breast area.  She states that this area is much more tender now than in previous weeks as well.  Exam today reveals a firm mass to the left upper outer breast.  The mass is approximately 2-3 cm in diameter; and does appear to also extend to the center of the chest and to the left axilla region as well.  There is no erythema, edema, warmth, or red streaks to the site.  The area is tender with palpation.  Also, patient has some mild, chronic left upper extremity lymphedema as well.  Reviewed all findings with Dr. Lindi Adie; he recommended a CT with contrast of the chest for further evaluation.  Patient requested and was given prescription refills of both of her pain medications today.  Patient is scheduled to return on 04/14/2016 for labs, flush, visit, and chemotherapy.     Patient stated understanding of all instructions; and was in agreement with this plan of care. The patient knows to call the clinic with any problems, questions or concerns.   Total time spent with patient was 40 minutes;  with greater than 75 percent of that time spent in face to face counseling regarding patient's symptoms,  and coordination of care and follow up.  Disclaimer:This dictation was prepared with Dragon/digital dictation along with [employer]. Any transcriptional errors that result from this process are unintentional.  Drue Second, NP 04/01/2016

## 2016-04-01 NOTE — Assessment & Plan Note (Signed)
Patient presented to the [facility] today.  Receive cycle 7, day 8 of her gemcitabine chemotherapy regimen.  She presented to the [facility] today with complaint of increased mass to the left upper outer breast area.  She states that this area is much more tender now than in previous weeks as well.  Exam today reveals a firm mass to the left upper outer breast.  The mass is approximately 2-3 cm in diameter; and does appear to also extend to the center of the chest and to the left axilla region as well.  There is no erythema, edema, warmth, or red streaks to the site.  The area is tender with palpation.  Also, patient has some mild, chronic left upper extremity lymphedema as well.  Reviewed all findings with Dr. Lindi Adie; he recommended a CT with contrast of the chest for further evaluation.  Patient requested and was given prescription refills of both of her pain medications today.  Patient is scheduled to return on 04/14/2016 for labs, flush, visit, and chemotherapy.

## 2016-04-01 NOTE — Assessment & Plan Note (Signed)
Patient's blood sugar was elevated to 322 today.  Patient has no history of diabetes.  Will continue to monitor patient's blood sugar closely.  Also, patient was recommended to check her blood sugar at home if at all possible.

## 2016-04-05 ENCOUNTER — Telehealth: Payer: Self-pay

## 2016-04-05 NOTE — Telephone Encounter (Signed)
Pt asking lorazepam be called into [facility]. It helps her sleep better than the ambien. The last rx for lorazepam was 09/12/15 and it is not on her active MAR.

## 2016-04-06 ENCOUNTER — Ambulatory Visit (HOSPITAL_COMMUNITY)
Admission: RE | Admit: 2016-04-06 | Discharge: 2016-04-06 | Disposition: A | Discharge status: Home / Self Care | Payer: Medicare Other | Source: Ambulatory Visit | Attending: Nurse Practitioner | Admitting: Nurse Practitioner

## 2016-04-06 ENCOUNTER — Other Ambulatory Visit: Payer: Self-pay

## 2016-04-06 ENCOUNTER — Encounter (HOSPITAL_COMMUNITY): Payer: Self-pay

## 2016-04-06 DIAGNOSIS — C50919 Malignant neoplasm of unspecified site of unspecified female breast: Secondary | ICD-10-CM

## 2016-04-06 DIAGNOSIS — R918 Other nonspecific abnormal finding of lung field: Secondary | ICD-10-CM | POA: Diagnosis not present

## 2016-04-06 DIAGNOSIS — L989 Disorder of the skin and subcutaneous tissue, unspecified: Secondary | ICD-10-CM

## 2016-04-06 DIAGNOSIS — C7800 Secondary malignant neoplasm of unspecified lung: Secondary | ICD-10-CM | POA: Diagnosis not present

## 2016-04-06 DIAGNOSIS — Z853 Personal history of malignant neoplasm of breast: Secondary | ICD-10-CM | POA: Insufficient documentation

## 2016-04-06 DIAGNOSIS — R220 Localized swelling, mass and lump, head: Secondary | ICD-10-CM | POA: Insufficient documentation

## 2016-04-06 DIAGNOSIS — C50912 Malignant neoplasm of unspecified site of left female breast: Secondary | ICD-10-CM

## 2016-04-06 DIAGNOSIS — C50911 Malignant neoplasm of unspecified site of right female breast: Secondary | ICD-10-CM

## 2016-04-06 MED ORDER — IOPAMIDOL (ISOVUE-300) INJECTION 61%
75.0000 mL | Freq: Once | INTRAVENOUS | Status: AC | PRN
  Administered 2016-04-06: 75 mL via INTRAVENOUS

## 2016-04-06 NOTE — Progress Notes (Signed)
Received call from [facility] CT who states pt is currently in CT department for Head CT; however, they need order changed to [place] originally placed by Selena Lesser, NP but she is not in the office today.  Order entered as directed.

## 2016-04-07 ENCOUNTER — Other Ambulatory Visit: Payer: Self-pay

## 2016-04-07 ENCOUNTER — Other Ambulatory Visit: Payer: Self-pay | Admitting: *Deleted

## 2016-04-07 MED ORDER — LORAZEPAM 1 MG PO TABS: 1.0000 mg | ORAL_TABLET | Freq: Every day | ORAL | 0 refills | Status: DC

## 2016-04-07 NOTE — Telephone Encounter (Signed)
Attempted to contact pt x 2 but unable to reach pt or leave VM as VM not set up on either number listed in chart.  Prescription has been [facility] as requested.

## 2016-04-07 NOTE — Progress Notes (Signed)
Pt called triage line requesting new prescription for Ativan.  Pt stated to triage, RN that Ativan helped her with sleep more than ambien and she was requesting this change.  Reviewed with Dr. Lindi Adie who ordered Ativan '1mg'$  PO at bedtime.  [facility] and all remaining refills of ambien d/c'd.

## 2016-04-07 NOTE — Telephone Encounter (Signed)
VM message from patient inquiring about her refill for lorazepam. Pt asking for call back today. She initially called on 04/05/16

## 2016-04-11 ENCOUNTER — Other Ambulatory Visit: Payer: Self-pay | Admitting: Hematology and Oncology

## 2016-04-14 ENCOUNTER — Ambulatory Visit (HOSPITAL_BASED_OUTPATIENT_CLINIC_OR_DEPARTMENT_OTHER): Payer: Medicare Other | Admitting: Hematology and Oncology

## 2016-04-14 ENCOUNTER — Other Ambulatory Visit: Payer: Self-pay

## 2016-04-14 ENCOUNTER — Ambulatory Visit (HOSPITAL_BASED_OUTPATIENT_CLINIC_OR_DEPARTMENT_OTHER): Payer: Medicare Other

## 2016-04-14 ENCOUNTER — Other Ambulatory Visit (HOSPITAL_BASED_OUTPATIENT_CLINIC_OR_DEPARTMENT_OTHER): Payer: Medicare Other

## 2016-04-14 DIAGNOSIS — Z95828 Presence of other vascular implants and grafts: Secondary | ICD-10-CM

## 2016-04-14 DIAGNOSIS — C50919 Malignant neoplasm of unspecified site of unspecified female breast: Secondary | ICD-10-CM

## 2016-04-14 DIAGNOSIS — C50911 Malignant neoplasm of unspecified site of right female breast: Secondary | ICD-10-CM

## 2016-04-14 DIAGNOSIS — C7800 Secondary malignant neoplasm of unspecified lung: Secondary | ICD-10-CM

## 2016-04-14 DIAGNOSIS — C799 Secondary malignant neoplasm of unspecified site: Secondary | ICD-10-CM | POA: Diagnosis not present

## 2016-04-14 DIAGNOSIS — C7989 Secondary malignant neoplasm of other specified sites: Secondary | ICD-10-CM

## 2016-04-14 DIAGNOSIS — C50912 Malignant neoplasm of unspecified site of left female breast: Secondary | ICD-10-CM | POA: Diagnosis not present

## 2016-04-14 DIAGNOSIS — Z5111 Encounter for antineoplastic chemotherapy: Secondary | ICD-10-CM

## 2016-04-14 DIAGNOSIS — D6481 Anemia due to antineoplastic chemotherapy: Secondary | ICD-10-CM

## 2016-04-14 DIAGNOSIS — C7951 Secondary malignant neoplasm of bone: Secondary | ICD-10-CM

## 2016-04-14 DIAGNOSIS — Z170 Estrogen receptor positive status [ER+]: Secondary | ICD-10-CM

## 2016-04-14 LAB — CBC WITH DIFFERENTIAL/PLATELET
BASO%: 0.4 % (ref 0.0–2.0)
Basophils Absolute: 0 10*3/uL (ref 0.0–0.1)
EOS ABS: 0.2 10*3/uL (ref 0.0–0.5)
EOS%: 2.3 % (ref 0.0–7.0)
HEMATOCRIT: 34 % — AB (ref 34.8–46.6)
HGB: 11.1 g/dL — ABNORMAL LOW (ref 11.6–15.9)
LYMPH%: 30 % (ref 14.0–49.7)
MCH: 24 pg — ABNORMAL LOW (ref 25.1–34.0)
MCHC: 32.6 g/dL (ref 31.5–36.0)
MCV: 73.6 fL — ABNORMAL LOW (ref 79.5–101.0)
MONO#: 0.7 10*3/uL (ref 0.1–0.9)
MONO%: 8.5 % (ref 0.0–14.0)
NEUT#: 5 10*3/uL (ref 1.5–6.5)
NEUT%: 58.8 % (ref 38.4–76.8)
NRBC: 0 % (ref 0–0)
PLATELETS: 511 10*3/uL — AB (ref 145–400)
RBC: 4.62 10*6/uL (ref 3.70–5.45)
RDW: 15.4 % — ABNORMAL HIGH (ref 11.2–14.5)
WBC: 8.5 10*3/uL (ref 3.9–10.3)
lymph#: 2.6 10*3/uL (ref 0.9–3.3)

## 2016-04-14 LAB — COMPREHENSIVE METABOLIC PANEL
ALT: 44 U/L (ref 0–55)
ANION GAP: 9 meq/L (ref 3–11)
AST: 49 U/L — ABNORMAL HIGH (ref 5–34)
Albumin: 3.2 g/dL — ABNORMAL LOW (ref 3.5–5.0)
Alkaline Phosphatase: 59 U/L (ref 40–150)
BILIRUBIN TOTAL: 0.41 mg/dL (ref 0.20–1.20)
BUN: 11 mg/dL (ref 7.0–26.0)
CALCIUM: 9.3 mg/dL (ref 8.4–10.4)
CO2: 24 meq/L (ref 22–29)
CREATININE: 1.1 mg/dL (ref 0.6–1.1)
Chloride: 106 mEq/L (ref 98–109)
EGFR: 65 mL/min/{1.73_m2} — ABNORMAL LOW (ref >90)
Glucose: 169 mg/dl — ABNORMAL HIGH (ref 70–140)
Potassium: 4.3 mEq/L (ref 3.5–5.1)
Sodium: 139 mEq/L (ref 136–145)
TOTAL PROTEIN: 7.2 g/dL (ref 6.4–8.3)

## 2016-04-14 MED ORDER — SODIUM CHLORIDE 0.9 % IJ SOLN
10.0000 mL | INTRAMUSCULAR | Status: DC | PRN
  Administered 2016-04-14: 10 mL via INTRAVENOUS
  Filled 2016-04-14: qty 10

## 2016-04-14 MED ORDER — ZOLPIDEM TARTRATE 10 MG PO TABS: 10.0000 mg | ORAL_TABLET | Freq: Every evening | ORAL | 2 refills | Status: DC | PRN

## 2016-04-14 MED ORDER — SODIUM CHLORIDE 0.9 % IV SOLN
Freq: Once | INTRAVENOUS | Status: AC
  Administered 2016-04-14: 13:00:00 via INTRAVENOUS

## 2016-04-14 MED ORDER — SODIUM CHLORIDE 0.9 % IV SOLN
650.0000 mg/m2 | Freq: Once | INTRAVENOUS | Status: AC
  Administered 2016-04-14: 1064 mg via INTRAVENOUS
  Filled 2016-04-14: qty 27.98

## 2016-04-14 MED ORDER — SODIUM CHLORIDE 0.9% FLUSH
10.0000 mL | INTRAVENOUS | Status: DC | PRN
  Administered 2016-04-14: 10 mL
  Filled 2016-04-14: qty 10

## 2016-04-14 MED ORDER — HEPARIN SOD (PORK) LOCK FLUSH 100 UNIT/ML IV SOLN
500.0000 [IU] | Freq: Once | INTRAVENOUS | Status: AC | PRN
  Administered 2016-04-14: 500 [IU]
  Filled 2016-04-14: qty 5

## 2016-04-14 MED ORDER — SODIUM CHLORIDE 0.9 % IV SOLN
384.5000 mg | Freq: Once | INTRAVENOUS | Status: AC
  Administered 2016-04-14: 380 mg via INTRAVENOUS
  Filled 2016-04-14: qty 38

## 2016-04-14 MED ORDER — PROCHLORPERAZINE MALEATE 10 MG PO TABS
ORAL_TABLET | ORAL | Status: AC
  Filled 2016-04-14: qty 1

## 2016-04-14 MED ORDER — PROCHLORPERAZINE MALEATE 10 MG PO TABS
10.0000 mg | ORAL_TABLET | Freq: Once | ORAL | Status: AC
  Administered 2016-04-14: 10 mg via ORAL

## 2016-04-14 NOTE — Patient Instructions (Signed)
[facility] Discharge Instructions for Patients Receiving Chemotherapy  Today you received the following chemotherapy agents: Gemzar, Carboplatin   To help prevent nausea and vomiting after your treatment, we encourage you to take your nausea medication as directed.    If you develop nausea and vomiting that is not controlled by your nausea medication, call the clinic.   BELOW ARE SYMPTOMS THAT SHOULD BE REPORTED IMMEDIATELY:  *FEVER GREATER THAN 100.5 F  *CHILLS WITH OR WITHOUT FEVER  NAUSEA AND VOMITING THAT IS NOT CONTROLLED WITH YOUR NAUSEA MEDICATION  *UNUSUAL SHORTNESS OF BREATH  *UNUSUAL BRUISING OR BLEEDING  TENDERNESS IN MOUTH AND THROAT WITH OR WITHOUT PRESENCE OF ULCERS  *URINARY PROBLEMS  *BOWEL PROBLEMS  UNUSUAL RASH Items with * indicate a potential emergency and should be followed up as soon as possible.  Feel free to call the clinic you have any questions or concerns. The clinic phone number is [phone].  Please show the [facility] at check-in to the Emergency Department and triage nurse.

## 2016-04-14 NOTE — Patient Instructions (Signed)

## 2016-04-16 ENCOUNTER — Other Ambulatory Visit: Payer: Self-pay | Admitting: Hematology and Oncology

## 2016-04-17 ENCOUNTER — Encounter: Payer: Self-pay | Admitting: Hematology and Oncology

## 2016-04-17 NOTE — Assessment & Plan Note (Signed)
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 N1 M0 stage IIB Metastatic disease diagnosed 05/28/15 (subpectoral masses 3.5 cm and 4.5 cm. Four lung nodules the largest 1.4 cm in the right lung 1.7 cm sclerotic right iliac bone lesion) Left subpectoral mass biopsy 06/21/2015: Invasive high-grade ductal carcinoma ER 5%, PR 0%, HER-2 negative ratio 1.29 Prior treatment:  1. Xeloda 1500 mg by mouth twice a day started 07/07/2015 stopped 09/03/2015 (stopped for progression) 2. Carboplatin day 1 and gemcitabine days 1 and 8 every 3 weeks started 09/10/2015; completed cycle 5 01/20/2016 (carboplatin discontinued for neuropathy) 3. Complete treatment break for 2 months ------------------------------------------------------------------------------------------------------------------------------------------ Current treatment: Carboplatin and gemcitabine (carboplatin added because of progression of disease) We discussed other options including [place]  Chemotherapy toxicities: 1. Fatigue 2. Anemia due to chemotherapy: hgb improved mildly to 9.0 3. Severe leukocytosis with neutrophils markedly increased due to Neulasta: Neulasta discontinued  4. Grade 2 neuropathy  Pain issues: Oxycontin for long acting pain medication. Pain well controlled Insomnia: D/Ced Ativan and started restoril. Advised to try melatonin 43m as well Lymphedema: physical therapy. Continue to wear compression sleeves daily.  Return to clinic in 3 weeks

## 2016-04-17 NOTE — Progress Notes (Signed)
Patient Care Team: [name], MD as PCP - General (Family Medicine) [name], MD (Cardiology)  DIAGNOSIS: Bilateral breast cancer [facility])   Staging form: Breast, AJCC 7th Edition   - Clinical: Stage IIB (T2, N1, cM0) - Unsigned         Staging comments: Staged at breast conference 08/13/13    - Pathologic: No stage assigned - Unsigned  SUMMARY OF ONCOLOGIC HISTORY:   Bilateral breast cancer ([place])   07/23/2013 Mammogram    Bilateral breast masses. 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 N1 M0 stage IIB ER 100%, PR 0%, HER-2       06/17/2014 -  Radiation Therapy    Adjuvant radiation therapy      09/14/2014 -  Anti-estrogen oral therapy    Anastrozole 1 mg daily      05/28/2015 Imaging    CT scans: Enlarging subpectoral masses 3.1 x 3.5 cm, posteriorly lower density mass 4.5 x 2.1 cm, several right-sided lung nodules right lower lobe 1.4 cm, 3 other right lung nodules, 1.7 cm right iliac bone lesion      06/21/2015 Procedure    Left subpectoral mass biopsy: Invasive high-grade ductal carcinoma ER 5%, PR 0%, HER-2 negative ratio 1.29      09/01/2015 Imaging    Left chest wall mass increased in size 7.2 x 5.1 cm, multiple subcutaneous nodules, increase in the lung nodules both in number as well as in the size of existing nodules      09/10/2015 -  Chemotherapy    Carboplatin, gemcitabine days 1 and 8 q 3 weeks, carboplatin discontinued for neuropathy (treatment break from 01/20/2016 to 03/24/2016); Added Carboplatin back 04/14/16 (for progression)      01/26/2016 Imaging    Marked improvement in size of lung nodules with many of the nodules resolved index right lower lobe nodule 1.9 x 1.8 cm is now 0.7 x 0.6 cm, reduction in the size of left breast mass 7.2 cm down to 2.6 cm, enlargement in the hypodense mass in the uterus      04/06/2016 Imaging    CT chest: Interval progression of pre-existing lung nodules new lung metastases (46m, 142m 2.9 cm, 1.6 cm), interval progression of disease in the left breast 4.2 cm (was 2.6 cm), additional nodules 2.3 cm and 3.2 cm; CT head: scalp mass 1.9 cm       CHIEF  COMPLIANT: Follow-up to discuss the CT scans  INTERVAL HISTORY: Paula Navarro is a 65 year old with above-mentioned history metastatic breast cancer who has been on treatment break by her own choice and is here today to discuss the results of the CT scans. Clinically she is experiencing more pain and discomfort in the left chest wall mass. She is also complaining of a scalp lesion which is soft and cystic in nature. She had a CT of the chest and CT of the head.  REVIEW OF SYSTEMS:   Constitutional: Denies fevers, chills or abnormal weight loss Eyes: Denies blurriness of vision Ears, nose, mouth, throat, and face: Denies mucositis or sore throat Respiratory: Denies cough, dyspnea or wheezes Cardiovascular: Denies palpitation, chest discomfort Gastrointestinal:  Denies nausea, heartburn or change  in bowel habits Skin: Denies abnormal skin rashes Lymphatics: Left arm lymphedema Neurological: Grade 1 neuropathy Behavioral/Psych: Mood is stable, no new changes  Extremities: No lower extremity edema Breast: Left chest wall mass is more tender and slightly increased in size. All other systems were reviewed with the patient and are negative.  I have reviewed the past medical history, past surgical history, social history and family history with the patient and they are unchanged from previous note.  ALLERGIES:  is allergic to codeine and morphine and related.  MEDICATIONS:  Current Outpatient Prescriptions  Medication Sig Dispense Refill  . B Complex-C (SUPER B COMPLEX PO) Take 1 tablet by mouth daily.    . calcium-vitamin D (OSCAL-500) 500-400 MG-UNIT tablet Take 1 tablet by mouth 2 (two) times daily. 60 tablet 3  . docusate sodium (COLACE) 100 MG capsule Take 1 capsule (100 mg total) by mouth every 12 (twelve) hours. 60 capsule 0  . gabapentin (NEURONTIN) 300 MG capsule TAKE 2 CAPSULES (600 MG TOTAL) BY MOUTH 3 (THREE) TIMES DAILY. 180 capsule 5  . LORazepam (ATIVAN) 1 MG tablet Take 1 tablet (1 mg total) by mouth at bedtime. 30 tablet 0  . oxyCODONE (OXYCONTIN) 10 mg 12 hr tablet Take 1 tablet (10 mg total) by mouth every 12 (twelve) hours. 60 tablet 0  . oxyCODONE-acetaminophen (PERCOCET/ROXICET) 5-325 MG tablet 1 tabs PO q6h prn pain 60 tablet 0  . traZODone (DESYREL) 50 MG tablet Take 1 tablet (50 mg total) by mouth at bedtime. 7 tablet 0  . zolpidem (AMBIEN) 10 MG tablet Take 1 tablet (10 mg total) by mouth at bedtime as needed for sleep. 30 tablet 2   No current facility-administered medications for this visit.    Facility-Administered Medications Ordered in Other Visits  Medication Dose Route Frequency Provider Last Rate Last Dose  . sodium chloride flush (NS) 0.9 % injection 10 mL  10 mL Intracatheter PRN Nicholas Lose, MD   10 mL at 03/31/16 1352    PHYSICAL  EXAMINATION: ECOG PERFORMANCE STATUS: 1 - Symptomatic but completely ambulatory  Vitals:   04/14/16 1128  BP: 135/78  Pulse: 73  Resp: 18  Temp: 98.2 F (36.8 C)   Filed Weights   04/14/16 1128  Weight: 138 lb 12.8 oz (63 kg)    GENERAL:alert, no distress and comfortable SKIN: skin color, texture, turgor are normal, no rashes or significant lesions EYES: normal, Conjunctiva are pink and non-injected, sclera clear OROPHARYNX:no exudate, no erythema and lips, buccal mucosa, and tongue normal  NECK: supple, thyroid normal size, non-tender, without nodularity LYMPH:  no palpable lymphadenopathy in the cervical, axillary or inguinal LUNGS: clear to auscultation and percussion with normal breathing effort HEART: regular rate &  rhythm and no murmurs and no lower extremity edema ABDOMEN:abdomen soft, non-tender and normal bowel sounds MUSCULOSKELETAL:no cyanosis of digits and no clubbing  NEURO: alert & oriented x 3 with fluent speech, no focal motor/sensory deficits EXTREMITIES: No lower extremity edema BREAST: Left chest wall mass is slightly increased in size is tender to touch and is causing lymphedema left arm (exam performed in the presence of a chaperone)  LABORATORY DATA:  I have reviewed the data as listed   Chemistry      Component Value Date/Time   NA 139 04/14/2016 1013   K 4.3 04/14/2016 1013   CL 99 (L) 09/20/2015 2209   CO2 24 04/14/2016 1013   BUN 11.0 04/14/2016 1013   CREATININE 1.1 04/14/2016 1013      Component Value Date/Time   CALCIUM 9.3 04/14/2016 1013   ALKPHOS 59 04/14/2016 1013   AST 49 (H) 04/14/2016 1013   ALT 44 04/14/2016 1013   BILITOT 0.41 04/14/2016 1013       Lab Results  Component Value Date   WBC 8.5 04/14/2016   HGB 11.1 (L) 04/14/2016   HCT 34.0 (L) 04/14/2016   MCV 73.6 (L) 04/14/2016   PLT 511 (H) 04/14/2016   NEUTROABS 5.0 04/14/2016     ASSESSMENT & PLAN:  Bilateral breast cancer ([place]) Treated with neoadjuvant  chemotherapy followed by surgery 04/08/2014, radiation treatment completed 07/17/2014, antiestrogen therapy with anastrozole 09/14/2014 Left breastT2, N0, M0, IDC grade 3; 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 N1 M0 stage IIB Metastatic disease diagnosed 05/28/15 (subpectoral masses 3.5 cm and 4.5 cm. Four lung nodules the largest 1.4 cm in the right lung 1.7 cm sclerotic right iliac bone lesion) Left subpectoral mass biopsy 06/21/2015: Invasive high-grade ductal carcinoma ER 5%, PR 0%, HER-2 negative ratio 1.29 Prior treatment:  1. Xeloda 1500 mg by mouth twice a day started 07/07/2015 stopped 09/03/2015 (stopped for progression) 2. Carboplatin day 1 and gemcitabine days 1 and 8 every 3 weeks started 09/10/2015; completed cycle 5 01/20/2016 (carboplatin discontinued for neuropathy) 3. Complete treatment break for 2 months ------------------------------------------------------------------------------------------------------------------------------------------ Current treatment: Carboplatin and gemcitabine (carboplatin added because of progression of disease) We discussed other options including [place]  CT chest 04/06/2016: Interval progression of pre-existing lung nodules new lung metastases (30m, 131m 2.9 cm, 1.6 cm), interval progression of disease in the left breast 4.2 cm (was 2.6 cm), additional nodules 2.3 cm and 3.2 cm; CT head: scalp mass 1.9 cm  Chemotherapy toxicities: 1. Fatigue 2. Anemia due to chemotherapy: hgb improved mildly to 9.0 3. Severe leukocytosis with neutrophils markedly increased due to Neulasta: Neulasta discontinued  4. Grade 2 neuropathy  Pain issues: Oxycontin for long acting pain medication. Pain well controlled Insomnia: D/Ced Ativan and started restoril. Advised to try melatonin 62m54ms  well Lymphedema: physical therapy. Continue to wear compression sleeves daily.  Return to clinic in 3 weeks   No orders of the defined types were placed in this encounter.  The patient has a good understanding of the overall plan. she agrees with it. she will call with any problems that may develop before the next visit here.   GudRulon EisenmengerD 04/17/16

## 2016-04-19 ENCOUNTER — Telehealth: Payer: Self-pay

## 2016-04-19 NOTE — Telephone Encounter (Signed)
Received VM from pt stating she wished to discuss with someone that she had been coughing up white stuff and was having discomfort to upper back in regards to coughing.  Attempted to contact pt to discuss further but unable to reach pt.  Phone number rang continuously without going to VM.  Will attempt to contact pt again tomorrow to follow up.

## 2016-04-20 ENCOUNTER — Telehealth: Payer: Self-pay | Admitting: *Deleted

## 2016-04-20 NOTE — Telephone Encounter (Signed)
Received call from pt asking if she could have her chemo through a pump at home like her brother did.  Explained that not all drugs can be given that way.  She states she has an appt tomorrow for chemo & will be here but would like to get her chemo at [facility] this is closer for her.  Informed that she may have to change MD also.  She would prefer to keep Dr here but will change MD if necessary.  Message routed to Dr Rolla Plate RN

## 2016-04-21 ENCOUNTER — Other Ambulatory Visit (HOSPITAL_BASED_OUTPATIENT_CLINIC_OR_DEPARTMENT_OTHER): Payer: Medicare Other

## 2016-04-21 ENCOUNTER — Ambulatory Visit (HOSPITAL_BASED_OUTPATIENT_CLINIC_OR_DEPARTMENT_OTHER): Payer: Medicare Other

## 2016-04-21 ENCOUNTER — Ambulatory Visit: Payer: Medicare Other

## 2016-04-21 VITALS — BP 127/67 | HR 65 | Temp 98.5°F | Resp 18

## 2016-04-21 DIAGNOSIS — Z5111 Encounter for antineoplastic chemotherapy: Secondary | ICD-10-CM

## 2016-04-21 DIAGNOSIS — C50919 Malignant neoplasm of unspecified site of unspecified female breast: Secondary | ICD-10-CM

## 2016-04-21 DIAGNOSIS — C50911 Malignant neoplasm of unspecified site of right female breast: Secondary | ICD-10-CM | POA: Diagnosis not present

## 2016-04-21 DIAGNOSIS — Z95828 Presence of other vascular implants and grafts: Secondary | ICD-10-CM

## 2016-04-21 DIAGNOSIS — C50912 Malignant neoplasm of unspecified site of left female breast: Secondary | ICD-10-CM

## 2016-04-21 DIAGNOSIS — C7800 Secondary malignant neoplasm of unspecified lung: Secondary | ICD-10-CM

## 2016-04-21 DIAGNOSIS — C7951 Secondary malignant neoplasm of bone: Secondary | ICD-10-CM

## 2016-04-21 LAB — CBC WITH DIFFERENTIAL/PLATELET
BASO%: 0.5 % (ref 0.0–2.0)
BASOS ABS: 0 10*3/uL (ref 0.0–0.1)
EOS%: 2.5 % (ref 0.0–7.0)
Eosinophils Absolute: 0.1 10*3/uL (ref 0.0–0.5)
HEMATOCRIT: 34.7 % — AB (ref 34.8–46.6)
HGB: 10.6 g/dL — ABNORMAL LOW (ref 11.6–15.9)
LYMPH#: 2.4 10*3/uL (ref 0.9–3.3)
LYMPH%: 43.4 % (ref 14.0–49.7)
MCH: 22.9 pg — AB (ref 25.1–34.0)
MCHC: 30.6 g/dL — AB (ref 31.5–36.0)
MCV: 74.6 fL — AB (ref 79.5–101.0)
MONO#: 0.5 10*3/uL (ref 0.1–0.9)
MONO%: 9.1 % (ref 0.0–14.0)
NEUT#: 2.4 10*3/uL (ref 1.5–6.5)
NEUT%: 44.5 % (ref 38.4–76.8)
PLATELETS: 458 10*3/uL — AB (ref 145–400)
RBC: 4.66 10*6/uL (ref 3.70–5.45)
RDW: 14.4 % (ref 11.2–14.5)
WBC: 5.5 10*3/uL (ref 3.9–10.3)

## 2016-04-21 LAB — COMPREHENSIVE METABOLIC PANEL
ALBUMIN: 3.1 g/dL — AB (ref 3.5–5.0)
ALK PHOS: 59 U/L (ref 40–150)
ALT: 55 U/L (ref 0–55)
ANION GAP: 9 meq/L (ref 3–11)
AST: 47 U/L — ABNORMAL HIGH (ref 5–34)
BILIRUBIN TOTAL: 0.6 mg/dL (ref 0.20–1.20)
BUN: 11.2 mg/dL (ref 7.0–26.0)
CALCIUM: 10.1 mg/dL (ref 8.4–10.4)
CO2: 28 mEq/L (ref 22–29)
CREATININE: 0.8 mg/dL (ref 0.6–1.1)
Chloride: 102 mEq/L (ref 98–109)
Glucose: 119 mg/dl (ref 70–140)
Potassium: 4.1 mEq/L (ref 3.5–5.1)
Sodium: 138 mEq/L (ref 136–145)
TOTAL PROTEIN: 6.9 g/dL (ref 6.4–8.3)

## 2016-04-21 MED ORDER — SODIUM CHLORIDE 0.9 % IJ SOLN
10.0000 mL | INTRAMUSCULAR | Status: DC | PRN
  Administered 2016-04-21: 10 mL via INTRAVENOUS
  Filled 2016-04-21: qty 10

## 2016-04-21 MED ORDER — SODIUM CHLORIDE 0.9% FLUSH
10.0000 mL | INTRAVENOUS | Status: DC | PRN
  Administered 2016-04-21: 10 mL
  Filled 2016-04-21: qty 10

## 2016-04-21 MED ORDER — PROCHLORPERAZINE MALEATE 10 MG PO TABS
ORAL_TABLET | ORAL | Status: AC
  Filled 2016-04-21: qty 1

## 2016-04-21 MED ORDER — SODIUM CHLORIDE 0.9 % IV SOLN
Freq: Once | INTRAVENOUS | Status: AC
  Administered 2016-04-21: 12:00:00 via INTRAVENOUS

## 2016-04-21 MED ORDER — PROCHLORPERAZINE MALEATE 10 MG PO TABS
10.0000 mg | ORAL_TABLET | Freq: Once | ORAL | Status: AC
  Administered 2016-04-21: 10 mg via ORAL

## 2016-04-21 MED ORDER — SODIUM CHLORIDE 0.9 % IV SOLN
650.0000 mg/m2 | Freq: Once | INTRAVENOUS | Status: AC
  Administered 2016-04-21: 1064 mg via INTRAVENOUS
  Filled 2016-04-21: qty 27.98

## 2016-04-21 MED ORDER — HEPARIN SOD (PORK) LOCK FLUSH 100 UNIT/ML IV SOLN
500.0000 [IU] | Freq: Once | INTRAVENOUS | Status: AC | PRN
  Administered 2016-04-21: 500 [IU]
  Filled 2016-04-21: qty 5

## 2016-04-21 NOTE — Patient Instructions (Signed)
[facility] Discharge Instructions for Patients Receiving Chemotherapy  Today you received the following chemotherapy agents Gemzar  To help prevent nausea and vomiting after your treatment, we encourage you to take your nausea medication as directed.    If you develop nausea and vomiting that is not controlled by your nausea medication, call the clinic.   BELOW ARE SYMPTOMS THAT SHOULD BE REPORTED IMMEDIATELY:  *FEVER GREATER THAN 100.5 F  *CHILLS WITH OR WITHOUT FEVER  NAUSEA AND VOMITING THAT IS NOT CONTROLLED WITH YOUR NAUSEA MEDICATION  *UNUSUAL SHORTNESS OF BREATH  *UNUSUAL BRUISING OR BLEEDING  TENDERNESS IN MOUTH AND THROAT WITH OR WITHOUT PRESENCE OF ULCERS  *URINARY PROBLEMS  *BOWEL PROBLEMS  UNUSUAL RASH Items with * indicate a potential emergency and should be followed up as soon as possible.  Feel free to call the clinic you have any questions or concerns. The clinic phone number is [phone].  Please show the CHEMO ALERT CARD at check-in to the Emergency Department and triage nurse.   

## 2016-04-21 NOTE — Patient Instructions (Signed)

## 2016-04-24 NOTE — Telephone Encounter (Signed)
No pump chemo [facility]

## 2016-05-02 ENCOUNTER — Other Ambulatory Visit: Payer: Self-pay

## 2016-05-02 DIAGNOSIS — C50512 Malignant neoplasm of lower-outer quadrant of left female breast: Principal | ICD-10-CM

## 2016-05-02 DIAGNOSIS — C50511 Malignant neoplasm of lower-outer quadrant of right female breast: Secondary | ICD-10-CM

## 2016-05-02 MED ORDER — OXYCODONE-ACETAMINOPHEN 5-325 MG PO TABS: ORAL_TABLET | ORAL | 0 refills | Status: DC

## 2016-05-02 MED FILL — OXYCODONE/APAP 5/325 MG TAB: 5-325 | 15 days supply | Qty: 60 | Fill #0

## 2016-05-02 NOTE — Telephone Encounter (Signed)
Received triage call from pt stating she was experiencing pain at her tumor site and is requesting refill of pain medication.  Chart reviewed and refill deemed appropriate.  Instructed pt this would be available for pick up this afternoon at her convenience.  Dr. Lindi Adie notified of pt's complaints and pt to see Dr. Lindi Adie for assessment on Friday 10/6.  Pt verbalized understanding and without further questions or concerns at time of call.

## 2016-05-05 ENCOUNTER — Encounter: Payer: Self-pay | Admitting: Hematology and Oncology

## 2016-05-05 ENCOUNTER — Ambulatory Visit (HOSPITAL_BASED_OUTPATIENT_CLINIC_OR_DEPARTMENT_OTHER): Payer: Medicare Other | Admitting: Hematology and Oncology

## 2016-05-05 ENCOUNTER — Ambulatory Visit: Payer: Medicare Other

## 2016-05-05 ENCOUNTER — Ambulatory Visit (HOSPITAL_BASED_OUTPATIENT_CLINIC_OR_DEPARTMENT_OTHER): Payer: Medicare Other

## 2016-05-05 ENCOUNTER — Other Ambulatory Visit (HOSPITAL_BASED_OUTPATIENT_CLINIC_OR_DEPARTMENT_OTHER): Payer: Medicare Other

## 2016-05-05 VITALS — BP 113/58 | HR 70 | Temp 97.9°F | Resp 18 | Ht 62.0 in | Wt 140.3 lb

## 2016-05-05 DIAGNOSIS — C50919 Malignant neoplasm of unspecified site of unspecified female breast: Secondary | ICD-10-CM

## 2016-05-05 DIAGNOSIS — C7801 Secondary malignant neoplasm of right lung: Secondary | ICD-10-CM | POA: Diagnosis not present

## 2016-05-05 DIAGNOSIS — C7951 Secondary malignant neoplasm of bone: Secondary | ICD-10-CM

## 2016-05-05 DIAGNOSIS — Z95828 Presence of other vascular implants and grafts: Secondary | ICD-10-CM

## 2016-05-05 DIAGNOSIS — C50912 Malignant neoplasm of unspecified site of left female breast: Secondary | ICD-10-CM

## 2016-05-05 DIAGNOSIS — C50911 Malignant neoplasm of unspecified site of right female breast: Secondary | ICD-10-CM

## 2016-05-05 DIAGNOSIS — Z5111 Encounter for antineoplastic chemotherapy: Secondary | ICD-10-CM | POA: Diagnosis not present

## 2016-05-05 DIAGNOSIS — Z170 Estrogen receptor positive status [ER+]: Secondary | ICD-10-CM

## 2016-05-05 DIAGNOSIS — D6481 Anemia due to antineoplastic chemotherapy: Secondary | ICD-10-CM

## 2016-05-05 LAB — COMPREHENSIVE METABOLIC PANEL
ALBUMIN: 3.4 g/dL — AB (ref 3.5–5.0)
ALK PHOS: 57 U/L (ref 40–150)
ALT: 38 U/L (ref 0–55)
AST: 51 U/L — ABNORMAL HIGH (ref 5–34)
Anion Gap: 10 mEq/L (ref 3–11)
BILIRUBIN TOTAL: 0.36 mg/dL (ref 0.20–1.20)
BUN: 12.2 mg/dL (ref 7.0–26.0)
CO2: 24 meq/L (ref 22–29)
CREATININE: 0.8 mg/dL (ref 0.6–1.1)
Calcium: 9.3 mg/dL (ref 8.4–10.4)
Chloride: 105 mEq/L (ref 98–109)
GLUCOSE: 157 mg/dL — AB (ref 70–140)
Potassium: 4.4 mEq/L (ref 3.5–5.1)
SODIUM: 139 meq/L (ref 136–145)
TOTAL PROTEIN: 6.9 g/dL (ref 6.4–8.3)

## 2016-05-05 LAB — CBC WITH DIFFERENTIAL/PLATELET
BASO%: 0.4 % (ref 0.0–2.0)
Basophils Absolute: 0 10*3/uL (ref 0.0–0.1)
EOS ABS: 0.2 10*3/uL (ref 0.0–0.5)
EOS%: 3.2 % (ref 0.0–7.0)
HCT: 32.7 % — ABNORMAL LOW (ref 34.8–46.6)
HEMOGLOBIN: 10.4 g/dL — AB (ref 11.6–15.9)
LYMPH%: 23 % (ref 14.0–49.7)
MCH: 23.4 pg — ABNORMAL LOW (ref 25.1–34.0)
MCHC: 31.8 g/dL (ref 31.5–36.0)
MCV: 73.6 fL — AB (ref 79.5–101.0)
MONO#: 0.7 10*3/uL (ref 0.1–0.9)
MONO%: 9.7 % (ref 0.0–14.0)
NEUT%: 63.7 % (ref 38.4–76.8)
NEUTROS ABS: 4.8 10*3/uL (ref 1.5–6.5)
PLATELETS: 327 10*3/uL (ref 145–400)
RBC: 4.44 10*6/uL (ref 3.70–5.45)
RDW: 17 % — AB (ref 11.2–14.5)
WBC: 7.6 10*3/uL (ref 3.9–10.3)
lymph#: 1.7 10*3/uL (ref 0.9–3.3)

## 2016-05-05 MED ORDER — SODIUM CHLORIDE 0.9 % IV SOLN
482.0000 mg | Freq: Once | INTRAVENOUS | Status: AC
  Administered 2016-05-05: 480 mg via INTRAVENOUS
  Filled 2016-05-05: qty 48

## 2016-05-05 MED ORDER — PROCHLORPERAZINE MALEATE 10 MG PO TABS
ORAL_TABLET | ORAL | Status: AC
  Filled 2016-05-05: qty 1

## 2016-05-05 MED ORDER — SODIUM CHLORIDE 0.9% FLUSH
10.0000 mL | INTRAVENOUS | Status: DC | PRN
  Administered 2016-05-05: 10 mL
  Filled 2016-05-05: qty 10

## 2016-05-05 MED ORDER — DENOSUMAB 120 MG/1.7ML ~~LOC~~ SOLN
120.0000 mg | Freq: Once | SUBCUTANEOUS | Status: AC
  Administered 2016-05-05: 120 mg via SUBCUTANEOUS
  Filled 2016-05-05: qty 1.7

## 2016-05-05 MED ORDER — SODIUM CHLORIDE 0.9 % IJ SOLN
10.0000 mL | INTRAMUSCULAR | Status: DC | PRN
  Administered 2016-05-05: 10 mL via INTRAVENOUS
  Filled 2016-05-05: qty 10

## 2016-05-05 MED ORDER — SODIUM CHLORIDE 0.9 % IV SOLN
650.0000 mg/m2 | Freq: Once | INTRAVENOUS | Status: AC
  Administered 2016-05-05: 1064 mg via INTRAVENOUS
  Filled 2016-05-05: qty 27.98

## 2016-05-05 MED ORDER — SODIUM CHLORIDE 0.9 % IV SOLN
Freq: Once | INTRAVENOUS | Status: AC
  Administered 2016-05-05: 13:00:00 via INTRAVENOUS

## 2016-05-05 MED ORDER — PROCHLORPERAZINE MALEATE 10 MG PO TABS
10.0000 mg | ORAL_TABLET | Freq: Once | ORAL | Status: AC
  Administered 2016-05-05: 10 mg via ORAL

## 2016-05-05 MED ORDER — HEPARIN SOD (PORK) LOCK FLUSH 100 UNIT/ML IV SOLN
500.0000 [IU] | Freq: Once | INTRAVENOUS | Status: AC | PRN
  Administered 2016-05-05: 500 [IU]
  Filled 2016-05-05: qty 5

## 2016-05-05 NOTE — Patient Instructions (Addendum)
[facility] Discharge Instructions for Patients Receiving Chemotherapy  Today you received the following chemotherapy agents: Gemzar and Carboplatin.  To help prevent nausea and vomiting after your treatment, we encourage you to take your nausea medication as directed.    If you develop nausea and vomiting that is not controlled by your nausea medication, call the clinic.   BELOW ARE SYMPTOMS THAT SHOULD BE REPORTED IMMEDIATELY:  *FEVER GREATER THAN 100.5 F  *CHILLS WITH OR WITHOUT FEVER  NAUSEA AND VOMITING THAT IS NOT CONTROLLED WITH YOUR NAUSEA MEDICATION  *UNUSUAL SHORTNESS OF BREATH  *UNUSUAL BRUISING OR BLEEDING  TENDERNESS IN MOUTH AND THROAT WITH OR WITHOUT PRESENCE OF ULCERS  *URINARY PROBLEMS  *BOWEL PROBLEMS  UNUSUAL RASH Items with * indicate a potential emergency and should be followed up as soon as possible.  Feel free to call the clinic you have any questions or concerns. The clinic phone number is [phone].  Please show the [facility] at check-in to the Emergency Department and triage nurse.  Denosumab injection What is this medicine? DENOSUMAB (den oh sue mab) slows bone breakdown. Prolia is used to treat osteoporosis in women after menopause and in men. Delton See is used to prevent bone fractures and other bone problems caused by cancer bone metastases. Delton See is also used to treat giant cell tumor of the bone. This medicine may be used for other purposes; ask your health care provider or pharmacist if you have questions. What should I tell my health care provider before I take this medicine? They need to know if you have any of these conditions: -dental disease -eczema -infection or history of infections -kidney disease or on dialysis -low blood calcium or vitamin D -malabsorption syndrome -scheduled to have surgery or tooth extraction -taking medicine that contains denosumab -thyroid or parathyroid disease -an unusual reaction  to denosumab, other medicines, foods, dyes, or preservatives -pregnant or trying to get pregnant -breast-feeding How should I use this medicine? This medicine is for injection under the skin. It is given by a health care professional in a hospital or clinic setting. If you are getting Prolia, a special MedGuide will be given to you by the pharmacist with each prescription and refill. Be sure to read this information carefully each time. For Prolia, talk to your pediatrician regarding the use of this medicine in children. Special care may be needed. For Delton See, talk to your pediatrician regarding the use of this medicine in children. While this drug may be prescribed for children as young as 13 years for selected conditions, precautions do apply. Overdosage: If you think you have taken too much of this medicine contact a poison control center or emergency room at once. NOTE: This medicine is only for you. Do not share this medicine with others. What if I miss a dose? It is important not to miss your dose. Call your doctor or health care professional if you are unable to keep an appointment. What may interact with this medicine? Do not take this medicine with any of the following medications: -other medicines containing denosumab This medicine may also interact with the following medications: -medicines that suppress the immune system -medicines that treat cancer -steroid medicines like prednisone or cortisone This list may not describe all possible interactions. Give your health care provider a list of all the medicines, herbs, non-prescription drugs, or dietary supplements you use. Also tell them if you smoke, drink alcohol, or use illegal drugs. Some items may interact with your medicine.  What should I watch for while using this medicine? Visit your doctor or health care professional for regular checks on your progress. Your doctor or health care professional may order blood tests and other tests  to see how you are doing. Call your doctor or health care professional if you get a cold or other infection while receiving this medicine. Do not treat yourself. This medicine may decrease your body's ability to fight infection. You should make sure you get enough calcium and vitamin D while you are taking this medicine, unless your doctor tells you not to. Discuss the foods you eat and the vitamins you take with your health care professional. See your dentist regularly. Brush and floss your teeth as directed. Before you have any dental work done, tell your dentist you are receiving this medicine. Do not become pregnant while taking this medicine or for 5 months after stopping it. Women should inform their doctor if they wish to become pregnant or think they might be pregnant. There is a potential for serious side effects to an unborn child. Talk to your health care professional or pharmacist for more information. What side effects may I notice from receiving this medicine? Side effects that you should report to your doctor or health care professional as soon as possible: -allergic reactions like skin rash, itching or hives, swelling of the face, lips, or tongue -breathing problems -chest pain -fast, irregular heartbeat -feeling faint or lightheaded, falls -fever, chills, or any other sign of infection -muscle spasms, tightening, or twitches -numbness or tingling -skin blisters or bumps, or is dry, peels, or red -slow healing or unexplained pain in the mouth or jaw -unusual bleeding or bruising Side effects that usually do not require medical attention (Report these to your doctor or health care professional if they continue or are bothersome.): -muscle pain -stomach upset, gas This list may not describe all possible side effects. Call your doctor for medical advice about side effects. You may report side effects to FDA at 1-800-FDA-1088. Where should I keep my medicine? This medicine is only  given in a clinic, doctor's office, or other health care setting and will not be stored at home. NOTE: This sheet is a summary. It may not cover all possible information. If you have questions about this medicine, talk to your doctor, pharmacist, or health care provider.    2016, [employer]vier/Gold Standard. (2012-01-15 12:37:47)

## 2016-05-05 NOTE — Progress Notes (Signed)
Patient Care Team: [name], MD as PCP - General (Family Medicine) [name], MD (Cardiology)  DIAGNOSIS: Bilateral breast cancer [facility])   Staging form: Breast, AJCC 7th Edition   - Clinical: Stage IIB (T2, N1, cM0) - Unsigned         Staging comments: Staged at breast conference 08/13/13    - Pathologic: No stage assigned - Unsigned  SUMMARY OF ONCOLOGIC HISTORY:   Bilateral breast cancer ([place])   07/23/2013 Mammogram    Bilateral breast masses. 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 N1 M0 stage IIB ER 100%, PR 0%, HER-2       06/17/2014 -  Radiation Therapy    Adjuvant radiation therapy      09/14/2014 -  Anti-estrogen oral therapy    Anastrozole 1 mg daily      05/28/2015 Imaging    CT scans: Enlarging subpectoral masses 3.1 x 3.5 cm, posteriorly lower density mass 4.5 x 2.1 cm, several right-sided lung nodules right lower lobe 1.4 cm, 3 other right lung nodules, 1.7 cm right iliac bone lesion      06/21/2015 Procedure    Left subpectoral mass biopsy: Invasive high-grade ductal carcinoma ER 5%, PR 0%, HER-2 negative ratio 1.29      09/01/2015 Imaging    Left chest wall mass increased in size 7.2 x 5.1 cm, multiple subcutaneous nodules, increase in the lung nodules both in number as well as in the size of existing nodules      09/10/2015 -  Chemotherapy    Carboplatin, gemcitabine days 1 and 8 q 3 weeks, carboplatin discontinued for neuropathy (treatment break from 01/20/2016 to 03/24/2016); Added Carboplatin back 04/14/16 (for progression)      01/26/2016 Imaging    Marked improvement in size of lung nodules with many of the nodules resolved index right lower lobe nodule 1.9 x 1.8 cm is now 0.7 x 0.6 cm, reduction in the size of left breast mass 7.2 cm down to 2.6 cm, enlargement in the hypodense mass in the uterus      04/06/2016 Imaging    CT chest: Interval progression of pre-existing lung nodules new lung metastases (46m, 142m 2.9 cm, 1.6 cm), interval progression of disease in the left breast 4.2 cm (was 2.6 cm), additional nodules 2.3 cm and 3.2 cm; CT head: scalp mass 1.9 cm       CHIEF  COMPLIANT: Palliative chemotherapy with carboplatin and gemcitabine day 1 and 8 every 3 weeks  INTERVAL HISTORY: Paula Navarro is a 65 year old with above-mentioned history metastatic breast cancer currently on palliative chemotherapy with carboplatin and gemcitabine. Carboplatin was added on 04/14/2016 and today is the second cycle with the added carboplatin. We're watching neuropathy extremely closely. She continues to have mild tingling and numbness of the fingers and toes. The tumor on the left chest wall is clinically stable. She denies any nausea vomiting or fevers or chills. She continues to have pain related to the tumor.  REVIEW OF SYSTEMS:   Constitutional: Denies fevers, chills or abnormal weight loss Eyes: Denies blurriness of vision Ears, nose, mouth, throat, and face: Denies mucositis  or sore throat Respiratory: Denies cough, dyspnea or wheezes Cardiovascular: Denies palpitation, chest discomfort Gastrointestinal:  Denies nausea, heartburn or change in bowel habits Skin: Denies abnormal skin rashes Lymphatics: Denies new lymphadenopathy or easy bruising Neurological:Denies numbness, tingling or new weaknesses Behavioral/Psych: Mood is stable, no new changes  Extremities: No lower extremity edema  All other systems were reviewed with the patient and are negative.  I have reviewed the past medical history, past surgical history, social history and family history with the patient and they are unchanged from previous note.  ALLERGIES:  is allergic to codeine and morphine and related.  MEDICATIONS:  Current Outpatient Prescriptions  Medication Sig Dispense Refill  . B Complex-C (SUPER B COMPLEX PO) Take 1 tablet by mouth daily.    . calcium-vitamin D (OSCAL-500) 500-400 MG-UNIT tablet Take 1 tablet by mouth 2 (two) times daily. 60 tablet 3  . docusate sodium (COLACE) 100 MG capsule Take 1 capsule (100 mg total) by mouth every 12 (twelve) hours. 60 capsule 0  . gabapentin (NEURONTIN) 300 MG capsule TAKE 2 CAPSULES (600 MG TOTAL) BY MOUTH 3 (THREE) TIMES DAILY. 180 capsule 5  . LORazepam (ATIVAN) 1 MG tablet Take 1 tablet (1 mg total) by mouth at bedtime. 30 tablet 0  . oxyCODONE (OXYCONTIN) 10 mg 12 hr tablet Take 1 tablet (10 mg total) by mouth every 12 (twelve) hours. 60 tablet 0  . oxyCODONE-acetaminophen (PERCOCET/ROXICET) 5-325 MG tablet 1 tabs PO q6h prn pain 60 tablet 0  . prochlorperazine (COMPAZINE) 10 MG tablet TAKE 1 TABLET EVERY 6 HOURS AS NEEDED NAUEA OR VOMITING 30 tablet 1  . traZODone (DESYREL) 50 MG tablet Take 1 tablet (50 mg total) by mouth at bedtime. 7 tablet 0  . zolpidem (AMBIEN) 10 MG tablet Take 1 tablet (10 mg total) by mouth at bedtime as needed for sleep. 30 tablet 2   No current facility-administered medications for this visit.     Facility-Administered Medications Ordered in Other Visits  Medication Dose Route Frequency Provider Last Rate Last Dose  . 0.9 %  sodium chloride infusion   Intravenous Once Nicholas Lose, MD      . CARBOplatin (PARAPLATIN) 480 mg in sodium chloride 0.9 % 250 mL chemo infusion  480 mg Intravenous Once Nicholas Lose, MD      . gemcitabine (GEMZAR) 1,064 mg in sodium chloride 0.9 % 100 mL chemo infusion  650 mg/m2 (Treatment Plan Recorded) Intravenous Once Nicholas Lose, MD      . heparin lock flush 100 unit/mL  500 Units Intracatheter Once PRN Nicholas Lose, MD      . prochlorperazine (COMPAZINE) tablet 10 mg  10 mg Oral Once Nicholas Lose, MD      . sodium chloride flush (NS) 0.9 % injection  10 mL  10 mL Intracatheter PRN Nicholas Lose, MD   10 mL at 03/31/16 1352  . sodium chloride flush (NS) 0.9 % injection 10 mL  10 mL Intracatheter PRN Nicholas Lose, MD        PHYSICAL EXAMINATION: ECOG PERFORMANCE STATUS: 1 - Symptomatic but completely ambulatory  Vitals:   05/05/16 1130  BP: (!) 113/58  Pulse: 70  Resp: 18  Temp: 97.9 F (36.6 C)   Filed Weights   05/05/16 1130  Weight: 140 lb 4.8 oz (63.6 kg)    GENERAL:alert, no distress and comfortable SKIN: skin color, texture, turgor are normal, no rashes or significant lesions EYES: normal, Conjunctiva are pink and non-injected, sclera clear OROPHARYNX:no exudate, no erythema and lips, buccal mucosa, and tongue normal  NECK: supple, thyroid normal size, non-tender, without nodularity LYMPH:  no palpable lymphadenopathy in the cervical, axillary or inguinal LUNGS: clear to auscultation and percussion with normal breathing effort HEART: regular rate & rhythm and no murmurs and no lower extremity edema ABDOMEN:abdomen soft, non-tender and normal bowel sounds MUSCULOSKELETAL:no cyanosis of digits and no clubbing  NEURO: alert & oriented x 3 with fluent speech, no focal motor/sensory deficits EXTREMITIES: No lower extremity  edema  LABORATORY DATA:  I have reviewed the data as listed   Chemistry      Component Value Date/Time   NA 139 05/05/2016 1044   K 4.4 05/05/2016 1044   CL 99 (L) 09/20/2015 2209   CO2 24 05/05/2016 1044   BUN 12.2 05/05/2016 1044   CREATININE 0.8 05/05/2016 1044      Component Value Date/Time   CALCIUM 9.3 05/05/2016 1044   ALKPHOS 57 05/05/2016 1044   AST 51 (H) 05/05/2016 1044   ALT 38 05/05/2016 1044   BILITOT 0.36 05/05/2016 1044       Lab Results  Component Value Date   WBC 7.6 05/05/2016   HGB 10.4 (L) 05/05/2016   HCT 32.7 (L) 05/05/2016   MCV 73.6 (L) 05/05/2016   PLT 327 05/05/2016   NEUTROABS 4.8 05/05/2016     ASSESSMENT & PLAN:  Bilateral breast cancer ([place]) Treated with neoadjuvant chemotherapy followed by surgery 04/08/2014, radiation treatment completed 07/17/2014, antiestrogen therapy with anastrozole 09/14/2014 Left breastT2, N0, M0, IDC grade 3; 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 N1 M0 stage IIB Metastatic disease diagnosed 05/28/15 (subpectoral masses 3.5 cm and 4.5 cm. Four lung nodules the largest 1.4 cm in the right lung 1.7 cm sclerotic right iliac bone lesion) Left subpectoral mass biopsy 06/21/2015: Invasive high-grade ductal carcinoma ER 5%, PR 0%, HER-2 negative ratio 1.29 Prior treatment:  1. Xeloda 1500 mg by mouth twice a day started 07/07/2015 stopped 09/03/2015 (stopped for progression) 2. Carboplatin day 1 and gemcitabine days 1 and 8 every 3 weeks started 09/10/2015; completed cycle 5 01/20/2016 (carboplatin discontinued for neuropathy) 3. Complete treatment break for 2 months ------------------------------------------------------------------------------------------------------------------------------------------ Current treatment: Carboplatin and gemcitabine  (carboplatin added because of progression of disease 04/14/2016)  CT chest 04/06/2016: Interval progression of pre-existing lung nodules new lung metastases (31m, 114m 2.9 cm, 1.6 cm), interval progression of disease in the left breast 4.2 cm (was 2.6 cm), additional nodules 2.3 cm and 3.2 cm; CT head: scalp mass 1.9 cm  Chemotherapy toxicities: 1. Fatigue 2. Anemia due to chemotherapy: hgb improved mildly to 9.0 3. Severe  leukocytosis with neutrophils markedly increased due to Neulasta: Neulasta discontinued  4. Grade 2 neuropathy  Pain issues: Oxycontin for long acting pain medication. Pain well controlled Insomnia: D/Ced Ativan and started restoril. Advised to try melatonin 17m as well Lymphedema: physical therapy. Continue to wear compression sleeves daily. Bone mets: Xgeva q 6 weeks with calcium and Vit D  Return to clinic in 3 weeks   No orders of the defined types were placed in this encounter.  The patient has a good understanding of the overall plan. she agrees with it. she will call with any problems that may develop before the next visit here.   GRulon Eisenmenger MD 05/05/16

## 2016-05-05 NOTE — Assessment & Plan Note (Signed)
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 N1 M0 stage IIB Metastatic disease diagnosed 05/28/15 (subpectoral masses 3.5 cm and 4.5 cm. Four lung nodules the largest 1.4 cm in the right lung 1.7 cm sclerotic right iliac bone lesion) Left subpectoral mass biopsy 06/21/2015: Invasive high-grade ductal carcinoma ER 5%, PR 0%, HER-2 negative ratio 1.29 Prior treatment:  1. Xeloda 1500 mg by mouth twice a day started 07/07/2015 stopped 09/03/2015 (stopped for progression) 2. Carboplatin day 1 and gemcitabine days 1 and 8 every 3 weeks started 09/10/2015; completed cycle 5 01/20/2016 (carboplatin discontinued for neuropathy) 3. Complete treatment break for 2 months ------------------------------------------------------------------------------------------------------------------------------------------ Current treatment: Carboplatin and gemcitabine (carboplatin added because of progression of disease 04/14/2016)  CT chest 04/06/2016: Interval progression of pre-existing lung nodules new lung metastases (52m, 1638m 2.9 cm, 1.6 cm), interval progression of disease in the left breast 4.2 cm (was 2.6 cm), additional nodules 2.3 cm and 3.2 cm; CT head: scalp mass 1.9 cm  Chemotherapy toxicities: 1. Fatigue 2. Anemia due to chemotherapy: hgb improved mildly to 9.0 3. Severe leukocytosis with neutrophils markedly increased due to Neulasta: Neulasta discontinued  4. Grade 2 neuropathy  Pain issues: Oxycontin for long acting pain medication. Pain well controlled Insomnia: D/Ced Ativan and started restoril. Advised to try melatonin 38m79ms  well Lymphedema: physical therapy. Continue to wear compression sleeves daily.  Return to clinic in 3 weeks

## 2016-05-11 ENCOUNTER — Encounter: Payer: Self-pay | Admitting: Pharmacist

## 2016-05-12 ENCOUNTER — Other Ambulatory Visit: Payer: Self-pay | Admitting: Oncology

## 2016-05-12 ENCOUNTER — Ambulatory Visit (HOSPITAL_BASED_OUTPATIENT_CLINIC_OR_DEPARTMENT_OTHER): Payer: Medicare Other

## 2016-05-12 ENCOUNTER — Other Ambulatory Visit (HOSPITAL_BASED_OUTPATIENT_CLINIC_OR_DEPARTMENT_OTHER): Payer: Medicare Other

## 2016-05-12 VITALS — BP 132/69 | HR 75 | Temp 98.0°F | Resp 18

## 2016-05-12 DIAGNOSIS — C50911 Malignant neoplasm of unspecified site of right female breast: Secondary | ICD-10-CM | POA: Diagnosis not present

## 2016-05-12 DIAGNOSIS — C50919 Malignant neoplasm of unspecified site of unspecified female breast: Secondary | ICD-10-CM

## 2016-05-12 DIAGNOSIS — Z95828 Presence of other vascular implants and grafts: Secondary | ICD-10-CM

## 2016-05-12 LAB — COMPREHENSIVE METABOLIC PANEL
ALT: 65 U/L — ABNORMAL HIGH (ref 0–55)
ANION GAP: 10 meq/L (ref 3–11)
AST: 60 U/L — ABNORMAL HIGH (ref 5–34)
Albumin: 3.2 g/dL — ABNORMAL LOW (ref 3.5–5.0)
Alkaline Phosphatase: 61 U/L (ref 40–150)
BILIRUBIN TOTAL: 0.41 mg/dL (ref 0.20–1.20)
BUN: 12.3 mg/dL (ref 7.0–26.0)
CALCIUM: 9.5 mg/dL (ref 8.4–10.4)
CHLORIDE: 103 meq/L (ref 98–109)
CO2: 26 meq/L (ref 22–29)
Creatinine: 1 mg/dL (ref 0.6–1.1)
EGFR: 69 mL/min/{1.73_m2} — AB (ref >90)
Glucose: 254 mg/dl — ABNORMAL HIGH (ref 70–140)
Potassium: 3.9 mEq/L (ref 3.5–5.1)
Sodium: 138 mEq/L (ref 136–145)
TOTAL PROTEIN: 6.8 g/dL (ref 6.4–8.3)

## 2016-05-12 LAB — CBC WITH DIFFERENTIAL/PLATELET
BASO%: 1.7 % (ref 0.0–2.0)
Basophils Absolute: 0.1 10*3/uL (ref 0.0–0.1)
EOS ABS: 0.1 10*3/uL (ref 0.0–0.5)
EOS%: 2 % (ref 0.0–7.0)
HEMATOCRIT: 30.9 % — AB (ref 34.8–46.6)
HGB: 9.9 g/dL — ABNORMAL LOW (ref 11.6–15.9)
LYMPH%: 59.6 % — AB (ref 14.0–49.7)
MCH: 23.3 pg — ABNORMAL LOW (ref 25.1–34.0)
MCHC: 32 g/dL (ref 31.5–36.0)
MCV: 72.9 fL — AB (ref 79.5–101.0)
MONO#: 0.4 10*3/uL (ref 0.1–0.9)
MONO%: 13.9 % (ref 0.0–14.0)
NEUT#: 0.7 10*3/uL — ABNORMAL LOW (ref 1.5–6.5)
NEUT%: 22.8 % — AB (ref 38.4–76.8)
PLATELETS: 379 10*3/uL (ref 145–400)
RBC: 4.24 10*6/uL (ref 3.70–5.45)
RDW: 16.9 % — ABNORMAL HIGH (ref 11.2–14.5)
WBC: 3 10*3/uL — ABNORMAL LOW (ref 3.9–10.3)
lymph#: 1.8 10*3/uL (ref 0.9–3.3)

## 2016-05-12 MED ORDER — HEPARIN SOD (PORK) LOCK FLUSH 100 UNIT/ML IV SOLN
500.0000 [IU] | Freq: Once | INTRAVENOUS | Status: AC
  Administered 2016-05-12: 500 [IU] via INTRAVENOUS
  Filled 2016-05-12: qty 5

## 2016-05-12 MED ORDER — SODIUM CHLORIDE 0.9% FLUSH
10.0000 mL | INTRAVENOUS | Status: DC | PRN
  Administered 2016-05-12: 10 mL via INTRAVENOUS
  Filled 2016-05-12: qty 10

## 2016-05-12 NOTE — Patient Instructions (Addendum)
Food Safety for the Immunocompromised Person If you are immunocompromised, it is important to follow food safety guidelines. Bacteria and other harmful germs are more likely to be in raw or fresh foods. Thoroughly cooking foods destroys these germs. Fresh vegetables should be cooked until tender; meats should be cooked until well-done; and eggs should be cooked until the yolks are firm. Dairy products, juices, and ciders should have the word "pasteurized" on the label. The following information can help you choose the right foods and prepare them correctly in order to keep you healthy. WHAT DO I NEED TO KNOW ABOUT FOOD SAFETY?  Wash your hands with soap and water before and after preparing food. Always wash your hands after touching raw meat.  Wash any surfaces that you will be using to prepare food. Use hot, soapy water.  Keep foods separate when you are preparing and cooking a meal. Do not use the same knife or cutting board to cut your fresh produce and raw meat.  Cook food to the right temperature:  Beef, pork, veal, lamb, and steak should be cooked to 145F (63C) with a 3-minute rest time.  Fish should be cooked to 145F.  Ground beef, pork, veal, and lamb should be cooked to 160F (71C).  Egg dishes should be cooked to 160F.  Poultry (whole, pieces, and ground) should be cooked to 165F (74C).  Put any leftovers in the refrigerator as soon as possible to stop bacteria from growing and to keep your food from going bad.  Hot foods should be kept at 171F (60C) or more, and cold foods should be kept at 71F (4.4C) or less. PREPARATION GUIDELINES Cooking and Eating Utensil Preparation  Wash the following with soap and hot water before and after use:  Countertops.  Contractor.  Cooking utensils.  Silverware.  Flatware.  Pots and pans.  Dishes.  Glassware.  Air dry all cooking and eating utensils. Do not dry them with a cloth towel. Food Preparation  Do not  buy food that has passed the expiration or "use by" date.  Wash your hands often for at least 20 seconds with warm, soapy water and dry them with paper towels. This is especially important after you have touched raw meat, eggs, or fish.  Wash fruits and vegetables thoroughly under cold running water before peeling or cutting them. Individually scrub produce that has a thick, rough skin or rind, such as cabbage. Do not use commercial rinses to wash fruits and vegetables.  Rinse packaged salads, slaw mix, and other prepared produce under cold running water. Do this even if the food is labeled "prewashed."  Thaw frozen foods in the refrigerator overnight or quickly in the microwave. Do not thaw food on countertops.  Do not touch or use raw yeast. There is a risk that you could breathe it in. Raw yeast is used to make bread.  Cook all perishable foods thoroughly.  Do not leave easily spoiled items at room temperature for more than 10-15 minutes.  Refrigerate leftovers as soon as possible in small, airtight, shallow containers.  Eat leftovers only if they have been stored properly. Do not eat leftovers that have been around for longer than 24 hours.  Boil marinades before using them on raw foods.  Clean the outside of your canned goods before opening them.  Do not put cooked food on a surface that you had placed raw meat, fish, or eggs on. Those surfaces must be washed with warm, soapy water before you use  them again.  Always place raw meat, seafood, and eggs in plastic bags before putting them in your shopping cart at the grocery story. Also, bag those items separately and not with other food you have purchased. Once home, place them in your refrigerator right away. WHAT FOODS CAN I NOT EAT? Grains  Fresh bakery breads, muffins, cakes, donuts, and cream- or custard-filled cakes.  Raw or uncooked grain products.  Beer that has "unpasteurized" on the label, is made with uncooked brewer's  yeast, is homemade or home brewed, or is from a microbrewery. Vegetables  Unwashedraw vegetables and salads.  Unpasteurized vegetable juice.  Raw vegetable sprouts, such as alfalfa, radish, broccoli, and mung bean.  Salads from the deli or salad bar. Fruits  Unwashed raw fruit.  Unpasteurized fruit juices.  Fresh apple cider. Meat and Other Protein Sources  All raw, uncooked, undercooked, or rare meat, fish, eggs, poultry, or tofu. This includes:  Sushi.  Partially cooked seafood, such as shrimp and crab.  Raw shellfish, such as oysters, clams, mussels, and scallops and their juices.  Refrigerated smoked seafood, including smoked salmon and lox.  Unpasteurized, refrigerated pates or meat spreads.  Unheated cold cuts from the deli, including hot dogs, dry or fermented sausage, or other deli meat. These are okay if you heat them until they are steaming or reach 165F.  Hard cured salami in natural wrap.  Any meat, poultry, or seafood salad made at the grocery store or at [employer].  Pickled fish.  Any fermented foods such as tempeh or miso products.  Unprocessed nuts, unroasted raw nuts, and roasted nuts in the shell.  Food products made with raw or undercooked eggs, such as Caesar salad dressing, mayonnaise, homemade cookie dough, cake batters, and eggnog. While most products at the grocery store are made with pasteurized eggs, you still need to read the label to make sure. Do not eat anything that has the word "unpasteurized" on the label. Dairy  Soft cheeses made from unpasteurized milk or molds (such as feta, Brie, Camembert, and Gorgonzola), blue-veined cheese (such as Stilton and Roquefort), Mexican-style cheeses (such as [place]), and farmer's cheese.  Unpasteurized or raw milk cheese, yogurt, and other milk products.  Cheeses containing chili peppers or other uncooked vegetables.  Any imported cheeses.  Any cheese sliced at a  deli. Beverages  Unboiled well water.  Cold-brewed tea or "sun teas" made with warm or cold water.  Mate tea or yerba mate tea.  Raw, unpasteurized milk.  Eggnog or milkshakes made with raw eggs.  Unpasteurized fruit and vegetable juices.  Fresh apple cider.  Wine or beer that is unpasteurized, homemade or home brewed, or from a microbrewery. Condiments  Uncooked herbs or spices.  Raw or unpasteurized honey.  Prepackaged salsas stored in a refrigerated case. Sweets/Desserts  Unrefrigerated custard or cream-filled pastry products.  Soft-serve ice cream or frozen yogurt.  Hand-packed ice cream or frozen yogurt. Fats  Fresh salad dressings containing raw eggs or aged cheese (such as blue cheese and Roquefort) stored in a refrigerated case.   This information is not intended to replace advice given to you by your health care provider. Make sure you discuss any questions you have with your health care provider.   Document Released: 05/14/2007 Document Revised: 08/07/2014 Document Reviewed: 12/16/2013 Elsevier Interactive Patient Education 2016 [employer]     Neutropenia Neutropenia is a condition that occurs when the level of a certain type of white blood cell (neutrophil) in your body becomes lower  than normal. Neutrophils are made in the bone marrow and fight infections. These cells protect against bacteria and viruses. The fewer neutrophils you have, and the longer your body remains without them, the greater your risk of getting a severe infection becomes. CAUSES  The cause of neutropenia may be hard to determine. However, it is usually due to 3 main problems:   Decreased production of neutrophils. This may be due to:  Certain medicines such as chemotherapy.  Genetic problems.  Cancer.  Radiation treatments.  Vitamin deficiency.  Some pesticides.  Increased destruction of neutrophils. This may be due to:  Overwhelming infections.  Hemolytic anemia.  This is when the body destroys its own blood cells.  Chemotherapy.  Neutrophils moving to areas of the body where they cannot fight infections. This may be due to:  Dialysis procedures.  Conditions where the spleen becomes enlarged. Neutrophils are held in the spleen and are not available to the rest of the body.  Overwhelming infections. The neutrophils are held in the area of the infection and are not available to the rest of the body. SYMPTOMS  There are no specific symptoms of neutropenia. The lack of neutrophils can result in an infection, and an infection can cause various problems. DIAGNOSIS  Diagnosis is made by a blood test. A complete blood count is performed. The normal level of neutrophils in human blood differs with age and race. Infants have lower counts than older children and adults. African Americans have lower counts than Caucasians or Asians. The average adult level is 1500 cells/mm3 of blood. Neutrophil counts are interpreted as follows:  Greater than 1000 cells/mm3 gives normal protection against infection.  500 to 1000 cells/mm3 gives an increased risk for infection.  200 to 500 cells/mm3 is a greater risk for severe infection.  Lower than 200 cells/mm3 is a marked risk of infection. This may require hospitalization and treatment with antibiotic medicines. TREATMENT  Treatment depends on the underlying cause, severity, and presence of infections or symptoms. It also depends on your health. Your caregiver will discuss the treatment plan with you. Mild cases are often easily treated and have a good outcome. Preventative measures may also be started to limit your risk of infections. Treatment can include:  Taking antibiotics.  Stopping medicines that are known to cause neutropenia.  Correcting nutritional deficiencies by eating green vegetables to supply folic acid and taking vitamin B supplements.  Stopping exposure to pesticides if your neutropenia is related to  pesticide exposure.  Taking a blood growth factor called sargramostim, pegfilgrastim, or filgrastim if you are undergoing chemotherapy for cancer. This stimulates white blood cell production.  Removal of the spleen if you have Felty's syndrome and have repeated infections. HOME CARE INSTRUCTIONS   Follow your caregiver's instructions about when you need to have blood work done.  Wash your hands often. Make sure others who come in contact with you also wash their hands.  Wash raw fruits and vegetables before eating them. They can carry bacteria and fungi.  Avoid people with colds or spreadable (contagious) diseases (chickenpox, herpes zoster, influenza).  Avoid large crowds.  Avoid construction areas. The dust can release fungus into the air.  Be cautious around children in daycare or school environments.  Take care of your respiratory system by coughing and deep breathing.  Bathe daily.  Protect your skin from cuts and burns.  Do not work in the garden or with flowers and plants.  Care for the mouth before and after meals by  brushing with a soft toothbrush. If you have mucositis, do not use mouthwash. Mouthwash contains alcohol and can dry out the mouth even more.  Clean the area between the genitals and the anus (perineal area) after urination and bowel movements. Women need to wipe from front to back.  Use a water soluble lubricant during sexual intercourse and practice good hygiene after. Do not have intercourse if you are severely neutropenic. Check with your caregiver for guidelines.  Exercise daily as tolerated.  Avoid people who were vaccinated with a live vaccine in the past 30 days. You should not receive live vaccines (polio, typhoid).  Do not provide direct care for pets. Avoid animal droppings. Do not clean litter boxes and bird cages.  Do not share food utensils.  Do not use tampons, enemas, or rectal suppositories unless directed by your caregiver.  Use an  electric razor to remove hair.  Wash your hands after handling magazines, letters, and newspapers. SEEK IMMEDIATE MEDICAL CARE IF:   You have a fever.  You have chills or start to shake.  You feel nauseous or vomit.  You develop mouth sores.  You develop aches and pains.  You have redness and swelling around open wounds.  Your skin is warm to the touch.  You have pus coming from your wounds.  You develop swollen lymph nodes.  You feel weak or fatigued.  You develop red streaks on the skin. MAKE SURE YOU:  Understand these instructions.  Will watch your condition.  Will get help right away if you are not doing well or get worse.   This information is not intended to replace advice given to you by your health care provider. Make sure you discuss any questions you have with your health care provider.   Document Released: 01/06/2002 Document Revised: 10/09/2011 Document Reviewed: 01/27/2015 Elsevier Interactive Patient Education [employer].

## 2016-05-12 NOTE — Progress Notes (Signed)
Reviewed lab values with Dr. Jana Hakim and he decided to not proceed with treatment today.  Will resume treatment at next visit.    Patient also complained of rash for last two days that is being controlled with benadryl, advised to contact us if it did not improve.

## 2016-05-16 ENCOUNTER — Telehealth: Payer: Self-pay | Admitting: Medical Oncology

## 2016-05-16 ENCOUNTER — Other Ambulatory Visit: Payer: Self-pay

## 2016-05-16 DIAGNOSIS — C50512 Malignant neoplasm of lower-outer quadrant of left female breast: Principal | ICD-10-CM

## 2016-05-16 DIAGNOSIS — C50511 Malignant neoplasm of lower-outer quadrant of right female breast: Secondary | ICD-10-CM

## 2016-05-16 MED ORDER — OXYCODONE-ACETAMINOPHEN 5-325 MG PO TABS: ORAL_TABLET | ORAL | 0 refills | Status: DC

## 2016-05-16 NOTE — Telephone Encounter (Signed)
Has 2 oxycodone left and requests refill. She wants to pick it up tomorrow. Please call her and let her know if this is okay

## 2016-05-16 NOTE — Telephone Encounter (Signed)
Called pt back to let her know prescription available for pick up.  Pt verbalized understanding and is without further questions or concerns at time of call.

## 2016-05-17 MED FILL — OXYCODONE/APAP 5/325 MG TAB: 5-325 | 15 days supply | Qty: 60 | Fill #0

## 2016-05-17 NOTE — Telephone Encounter (Signed)
"  Do I have a prescription ready.  Advised pain refill ws prepared yesterday.  En route to arrive before 4:30.

## 2016-05-19 ENCOUNTER — Telehealth: Payer: Self-pay | Admitting: *Deleted

## 2016-05-19 ENCOUNTER — Ambulatory Visit (HOSPITAL_BASED_OUTPATIENT_CLINIC_OR_DEPARTMENT_OTHER): Payer: Medicare Other

## 2016-05-19 ENCOUNTER — Other Ambulatory Visit (HOSPITAL_BASED_OUTPATIENT_CLINIC_OR_DEPARTMENT_OTHER): Payer: Medicare Other

## 2016-05-19 DIAGNOSIS — C50911 Malignant neoplasm of unspecified site of right female breast: Secondary | ICD-10-CM

## 2016-05-19 DIAGNOSIS — C50919 Malignant neoplasm of unspecified site of unspecified female breast: Secondary | ICD-10-CM

## 2016-05-19 DIAGNOSIS — Z95828 Presence of other vascular implants and grafts: Secondary | ICD-10-CM

## 2016-05-19 LAB — CBC WITH DIFFERENTIAL/PLATELET
BASO%: 0.8 % (ref 0.0–2.0)
BASOS ABS: 0.1 10*3/uL (ref 0.0–0.1)
EOS%: 1.4 % (ref 0.0–7.0)
Eosinophils Absolute: 0.1 10*3/uL (ref 0.0–0.5)
HEMATOCRIT: 34.9 % (ref 34.8–46.6)
HGB: 10.8 g/dL — ABNORMAL LOW (ref 11.6–15.9)
LYMPH#: 2.2 10*3/uL (ref 0.9–3.3)
LYMPH%: 29.2 % (ref 14.0–49.7)
MCH: 23.2 pg — AB (ref 25.1–34.0)
MCHC: 31 g/dL — AB (ref 31.5–36.0)
MCV: 74.8 fL — ABNORMAL LOW (ref 79.5–101.0)
MONO#: 1.3 10*3/uL — ABNORMAL HIGH (ref 0.1–0.9)
MONO%: 16.6 % — ABNORMAL HIGH (ref 0.0–14.0)
NEUT#: 3.9 10*3/uL (ref 1.5–6.5)
NEUT%: 52 % (ref 38.4–76.8)
PLATELETS: 129 10*3/uL — AB (ref 145–400)
RBC: 4.66 10*6/uL (ref 3.70–5.45)
RDW: 17.8 % — ABNORMAL HIGH (ref 11.2–14.5)
WBC: 7.6 10*3/uL (ref 3.9–10.3)

## 2016-05-19 LAB — COMPREHENSIVE METABOLIC PANEL
ALT: 51 U/L (ref 0–55)
ANION GAP: 12 meq/L — AB (ref 3–11)
AST: 70 U/L — AB (ref 5–34)
Albumin: 3.4 g/dL — ABNORMAL LOW (ref 3.5–5.0)
Alkaline Phosphatase: 58 U/L (ref 40–150)
BUN: 9.4 mg/dL (ref 7.0–26.0)
CALCIUM: 9.5 mg/dL (ref 8.4–10.4)
CHLORIDE: 105 meq/L (ref 98–109)
CO2: 23 meq/L (ref 22–29)
Creatinine: 0.8 mg/dL (ref 0.6–1.1)
EGFR: 86 mL/min/{1.73_m2} — ABNORMAL LOW (ref >90)
Glucose: 196 mg/dl — ABNORMAL HIGH (ref 70–140)
POTASSIUM: 4.1 meq/L (ref 3.5–5.1)
Sodium: 140 mEq/L (ref 136–145)
Total Bilirubin: 0.31 mg/dL (ref 0.20–1.20)
Total Protein: 7.1 g/dL (ref 6.4–8.3)

## 2016-05-19 LAB — TECHNOLOGIST REVIEW

## 2016-05-19 MED ORDER — HEPARIN SOD (PORK) LOCK FLUSH 100 UNIT/ML IV SOLN
500.0000 [IU] | Freq: Once | INTRAVENOUS | Status: AC
  Administered 2016-05-19: 500 [IU] via INTRAVENOUS
  Filled 2016-05-19: qty 5

## 2016-05-19 MED ORDER — SODIUM CHLORIDE 0.9% FLUSH
10.0000 mL | INTRAVENOUS | Status: DC | PRN
  Administered 2016-05-19: 10 mL via INTRAVENOUS
  Filled 2016-05-19: qty 10

## 2016-05-19 NOTE — Patient Instructions (Signed)

## 2016-05-19 NOTE — Telephone Encounter (Signed)
Patient called about CBC results.  She was unable to take chemo last week.  Her WBC was down last week.  Her WBC is recovered now.  Patient talked about going to [place] for treatments.  Her dtr. Found it on the internet, however patient is aware it might not be the best thing.  She knows it is illegal here.  Encouraged her to discuss with Dr.Gudena and she promised she would.

## 2016-05-26 ENCOUNTER — Other Ambulatory Visit (HOSPITAL_BASED_OUTPATIENT_CLINIC_OR_DEPARTMENT_OTHER): Payer: Medicare Other

## 2016-05-26 ENCOUNTER — Ambulatory Visit: Payer: Medicare Other

## 2016-05-26 ENCOUNTER — Other Ambulatory Visit: Payer: Self-pay

## 2016-05-26 ENCOUNTER — Other Ambulatory Visit: Payer: Self-pay | Admitting: Oncology

## 2016-05-26 ENCOUNTER — Encounter: Payer: Self-pay | Admitting: Oncology

## 2016-05-26 ENCOUNTER — Ambulatory Visit (HOSPITAL_BASED_OUTPATIENT_CLINIC_OR_DEPARTMENT_OTHER): Payer: Medicare Other

## 2016-05-26 VITALS — BP 107/66 | HR 78 | Temp 98.2°F | Resp 18

## 2016-05-26 DIAGNOSIS — C50919 Malignant neoplasm of unspecified site of unspecified female breast: Secondary | ICD-10-CM

## 2016-05-26 DIAGNOSIS — C7951 Secondary malignant neoplasm of bone: Secondary | ICD-10-CM

## 2016-05-26 DIAGNOSIS — Z5111 Encounter for antineoplastic chemotherapy: Secondary | ICD-10-CM | POA: Diagnosis not present

## 2016-05-26 DIAGNOSIS — C50911 Malignant neoplasm of unspecified site of right female breast: Secondary | ICD-10-CM

## 2016-05-26 DIAGNOSIS — C7801 Secondary malignant neoplasm of right lung: Secondary | ICD-10-CM

## 2016-05-26 DIAGNOSIS — Z95828 Presence of other vascular implants and grafts: Secondary | ICD-10-CM

## 2016-05-26 DIAGNOSIS — C50912 Malignant neoplasm of unspecified site of left female breast: Secondary | ICD-10-CM

## 2016-05-26 DIAGNOSIS — Z170 Estrogen receptor positive status [ER+]: Secondary | ICD-10-CM

## 2016-05-26 LAB — CBC WITH DIFFERENTIAL/PLATELET
BASO%: 0.8 % (ref 0.0–2.0)
Basophils Absolute: 0.1 10*3/uL (ref 0.0–0.1)
EOS ABS: 0.2 10*3/uL (ref 0.0–0.5)
EOS%: 2.8 % (ref 0.0–7.0)
HCT: 37.1 % (ref 34.8–46.6)
HEMOGLOBIN: 11.5 g/dL — AB (ref 11.6–15.9)
LYMPH#: 1.9 10*3/uL (ref 0.9–3.3)
LYMPH%: 26.9 % (ref 14.0–49.7)
MCH: 23.1 pg — ABNORMAL LOW (ref 25.1–34.0)
MCHC: 31.1 g/dL — ABNORMAL LOW (ref 31.5–36.0)
MCV: 74.2 fL — AB (ref 79.5–101.0)
MONO#: 1.2 10*3/uL — AB (ref 0.1–0.9)
MONO%: 16.5 % — ABNORMAL HIGH (ref 0.0–14.0)
NEUT%: 53 % (ref 38.4–76.8)
NEUTROS ABS: 3.8 10*3/uL (ref 1.5–6.5)
PLATELETS: 229 10*3/uL (ref 145–400)
RBC: 4.99 10*6/uL (ref 3.70–5.45)
RDW: 18.8 % — AB (ref 11.2–14.5)
WBC: 7.2 10*3/uL (ref 3.9–10.3)

## 2016-05-26 LAB — COMPREHENSIVE METABOLIC PANEL
ALBUMIN: 3.5 g/dL (ref 3.5–5.0)
ALK PHOS: 59 U/L (ref 40–150)
ALT: 58 U/L — AB (ref 0–55)
ANION GAP: 10 meq/L (ref 3–11)
AST: 102 U/L — ABNORMAL HIGH (ref 5–34)
BILIRUBIN TOTAL: 0.58 mg/dL (ref 0.20–1.20)
BUN: 9.2 mg/dL (ref 7.0–26.0)
CO2: 25 meq/L (ref 22–29)
CREATININE: 0.8 mg/dL (ref 0.6–1.1)
Calcium: 9.4 mg/dL (ref 8.4–10.4)
Chloride: 103 mEq/L (ref 98–109)
GLUCOSE: 153 mg/dL — AB (ref 70–140)
Potassium: 4.2 mEq/L (ref 3.5–5.1)
Sodium: 138 mEq/L (ref 136–145)
TOTAL PROTEIN: 7.4 g/dL (ref 6.4–8.3)

## 2016-05-26 MED ORDER — PALONOSETRON HCL INJECTION 0.25 MG/5ML
INTRAVENOUS | Status: AC
  Filled 2016-05-26: qty 5

## 2016-05-26 MED ORDER — DEXAMETHASONE SODIUM PHOSPHATE 10 MG/ML IJ SOLN
INTRAMUSCULAR | Status: AC
  Filled 2016-05-26: qty 1

## 2016-05-26 MED ORDER — ONDANSETRON HCL 4 MG PO TABS: 4.0000 mg | ORAL_TABLET | Freq: Three times a day (TID) | ORAL | 0 refills | Status: DC | PRN

## 2016-05-26 MED ORDER — PROCHLORPERAZINE MALEATE 10 MG PO TABS
ORAL_TABLET | ORAL | Status: AC
  Filled 2016-05-26: qty 1

## 2016-05-26 MED ORDER — CARBOPLATIN CHEMO INTRADERMAL TEST DOSE 100MCG/0.02ML
100.0000 ug | Freq: Once | INTRADERMAL | Status: AC
  Administered 2016-05-26: 100 ug via INTRADERMAL
  Filled 2016-05-26: qty 0.02

## 2016-05-26 MED ORDER — DEXAMETHASONE SODIUM PHOSPHATE 10 MG/ML IJ SOLN
10.0000 mg | Freq: Once | INTRAMUSCULAR | Status: AC
  Administered 2016-05-26: 10 mg via INTRAVENOUS

## 2016-05-26 MED ORDER — SODIUM CHLORIDE 0.9 % IV SOLN
650.0000 mg/m2 | Freq: Once | INTRAVENOUS | Status: AC
  Administered 2016-05-26: 1064 mg via INTRAVENOUS
  Filled 2016-05-26: qty 27.98

## 2016-05-26 MED ORDER — PALONOSETRON HCL INJECTION 0.25 MG/5ML
0.2500 mg | Freq: Once | INTRAVENOUS | Status: AC
  Administered 2016-05-26: 0.25 mg via INTRAVENOUS

## 2016-05-26 MED ORDER — SODIUM CHLORIDE 0.9% FLUSH
10.0000 mL | INTRAVENOUS | Status: DC | PRN
  Administered 2016-05-26: 10 mL
  Filled 2016-05-26: qty 10

## 2016-05-26 MED ORDER — HEPARIN SOD (PORK) LOCK FLUSH 100 UNIT/ML IV SOLN
500.0000 [IU] | Freq: Once | INTRAVENOUS | Status: AC | PRN
  Administered 2016-05-26: 500 [IU]
  Filled 2016-05-26: qty 5

## 2016-05-26 MED ORDER — SODIUM CHLORIDE 0.9 % IV SOLN
Freq: Once | INTRAVENOUS | Status: AC
  Administered 2016-05-26: 12:00:00 via INTRAVENOUS

## 2016-05-26 MED ORDER — PROCHLORPERAZINE MALEATE 10 MG PO TABS: 10.0000 mg | ORAL_TABLET | Freq: Once | ORAL | Status: DC

## 2016-05-26 MED ORDER — SODIUM CHLORIDE 0.9 % IV SOLN
482.0000 mg | Freq: Once | INTRAVENOUS | Status: AC
  Administered 2016-05-26: 480 mg via INTRAVENOUS
  Filled 2016-05-26: qty 48

## 2016-05-26 MED ORDER — SODIUM CHLORIDE 0.9% FLUSH
10.0000 mL | INTRAVENOUS | Status: DC | PRN
  Administered 2016-05-26: 10 mL via INTRAVENOUS
  Filled 2016-05-26: qty 10

## 2016-05-26 MED FILL — ONDANSETRON HCL 4 MG TABLET: 4 | 10 days supply | Qty: 30 | Fill #0

## 2016-05-26 MED FILL — ZOLPIDEM TARTRATE 10 MG TAB: 10 | 30 days supply | Qty: 30 | Fill #0 | Status: TO

## 2016-05-26 NOTE — Patient Instructions (Signed)
[facility] Discharge Instructions for Patients Receiving Chemotherapy  Today you received the following chemotherapy agents Gemzar and Carboplatin. To help prevent nausea and vomiting after your treatment, we encourage you to take your nausea medication as directed. NO ZOFRAN FOR 3 DAYS  If you develop nausea and vomiting that is not controlled by your nausea medication, call the clinic.   BELOW ARE SYMPTOMS THAT SHOULD BE REPORTED IMMEDIATELY:  *FEVER GREATER THAN 100.5 F  *CHILLS WITH OR WITHOUT FEVER  NAUSEA AND VOMITING THAT IS NOT CONTROLLED WITH YOUR NAUSEA MEDICATION  *UNUSUAL SHORTNESS OF BREATH  *UNUSUAL BRUISING OR BLEEDING  TENDERNESS IN MOUTH AND THROAT WITH OR WITHOUT PRESENCE OF ULCERS  *URINARY PROBLEMS  *BOWEL PROBLEMS  UNUSUAL RASH Items with * indicate a potential emergency and should be followed up as soon as possible.  Feel free to call the clinic you have any questions or concerns. The clinic phone number is [phone].  Please show the [facility] at check-in to the Emergency Department and triage nurse.

## 2016-05-26 NOTE — Progress Notes (Signed)
Ok to treat with liver enzyme values today per Dr. Marko Plume today Lindi Adie is off).

## 2016-05-26 NOTE — Progress Notes (Signed)
Medical Oncology  Labs reviewed at request of infusion, in Dr Geralyn Flash absence, prior to carboplatin gemzar. AST 102, ALT 58, with bili normal and creatinine good.  OK to treat with these agents despite transaminase elevation. WIll update Dr Lindi Adie by this note.  Godfrey Pick, MD

## 2016-06-01 NOTE — Assessment & Plan Note (Signed)
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 N1 M0 stage IIB Metastatic diseasediagnosed 05/28/15 (subpectoral masses 3.5 cm and 4.5 cm. Four lung nodules the largest 1.4 cm in the right lung 1.7 cm sclerotic right iliac bone lesion) Left subpectoral mass biopsy 06/21/2015: Invasive high-grade ductal carcinoma ER 5%, PR 0%, HER-2 negative ratio 1.29 Prior treatment:  1. Xeloda 1500 mg by mouth twice a day started 07/07/2015 stopped 09/03/2015 (stopped for progression) 2. Carboplatin day 1 and gemcitabine days 1 and 8 every 3 weeks started 09/10/2015; completed cycle 5 01/20/2016 (carboplatin discontinued for neuropathy) 3. Complete treatment break for 2 months ------------------------------------------------------------------------------------------------------------------------------------------ Current treatment: Carboplatin and gemcitabine (carboplatin added because of progression of disease 04/14/2016)  CT chest 04/06/2016: Interval progression of pre-existing lung nodules new lung metastases (8m, 172m 2.9 cm, 1.6 cm), interval progression of disease in the left breast 4.2 cm (was 2.6 cm), additional nodules 2.3 cm and 3.2 cm; CT head: scalp mass 1.9 cm  Chemotherapy toxicities: 1. Fatigue 2. Anemia due to chemotherapy: hgb improved mildly to 9.0 3. Severe leukocytosis with neutrophils markedly increased due to Neulasta: Neulasta discontinued  4. Grade 2 neuropathy  Pain issues: Oxycontin for long acting pain medication. Pain well controlled Insomnia: D/Ced Ativan and started restoril. Advised to try melatonin 2m24ms  well Lymphedema: physical therapy. Continue to wear compression sleeves daily. Bone mets: Xgeva q 6 weeks with calcium and Vit D  Return to clinic in 3 weeks

## 2016-06-02 ENCOUNTER — Other Ambulatory Visit (HOSPITAL_BASED_OUTPATIENT_CLINIC_OR_DEPARTMENT_OTHER): Payer: Medicare Other

## 2016-06-02 ENCOUNTER — Encounter: Payer: Self-pay | Admitting: Hematology and Oncology

## 2016-06-02 ENCOUNTER — Other Ambulatory Visit: Payer: Self-pay

## 2016-06-02 ENCOUNTER — Ambulatory Visit (HOSPITAL_BASED_OUTPATIENT_CLINIC_OR_DEPARTMENT_OTHER): Payer: Medicare Other

## 2016-06-02 ENCOUNTER — Ambulatory Visit: Payer: Medicare Other

## 2016-06-02 ENCOUNTER — Ambulatory Visit (HOSPITAL_BASED_OUTPATIENT_CLINIC_OR_DEPARTMENT_OTHER): Payer: Medicare Other | Admitting: Hematology and Oncology

## 2016-06-02 VITALS — BP 142/76 | HR 79 | Temp 98.1°F | Resp 18 | Wt 137.2 lb

## 2016-06-02 DIAGNOSIS — C7951 Secondary malignant neoplasm of bone: Secondary | ICD-10-CM

## 2016-06-02 DIAGNOSIS — D6481 Anemia due to antineoplastic chemotherapy: Secondary | ICD-10-CM | POA: Diagnosis not present

## 2016-06-02 DIAGNOSIS — Z170 Estrogen receptor positive status [ER+]: Secondary | ICD-10-CM

## 2016-06-02 DIAGNOSIS — C7801 Secondary malignant neoplasm of right lung: Secondary | ICD-10-CM | POA: Diagnosis not present

## 2016-06-02 DIAGNOSIS — C50611 Malignant neoplasm of axillary tail of right female breast: Secondary | ICD-10-CM

## 2016-06-02 DIAGNOSIS — C50911 Malignant neoplasm of unspecified site of right female breast: Secondary | ICD-10-CM

## 2016-06-02 DIAGNOSIS — Z5111 Encounter for antineoplastic chemotherapy: Secondary | ICD-10-CM | POA: Diagnosis not present

## 2016-06-02 DIAGNOSIS — C50612 Malignant neoplasm of axillary tail of left female breast: Secondary | ICD-10-CM

## 2016-06-02 DIAGNOSIS — C50919 Malignant neoplasm of unspecified site of unspecified female breast: Secondary | ICD-10-CM

## 2016-06-02 DIAGNOSIS — C50912 Malignant neoplasm of unspecified site of left female breast: Secondary | ICD-10-CM | POA: Diagnosis not present

## 2016-06-02 LAB — CBC WITH DIFFERENTIAL/PLATELET
BASO%: 1.3 % (ref 0.0–2.0)
Basophils Absolute: 0 10*3/uL (ref 0.0–0.1)
EOS%: 2.4 % (ref 0.0–7.0)
Eosinophils Absolute: 0.1 10*3/uL (ref 0.0–0.5)
HCT: 34.1 % — ABNORMAL LOW (ref 34.8–46.6)
HGB: 10.6 g/dL — ABNORMAL LOW (ref 11.6–15.9)
LYMPH%: 55.2 % — AB (ref 14.0–49.7)
MCH: 23 pg — ABNORMAL LOW (ref 25.1–34.0)
MCHC: 31 g/dL — AB (ref 31.5–36.0)
MCV: 74.1 fL — ABNORMAL LOW (ref 79.5–101.0)
MONO#: 0.2 10*3/uL (ref 0.1–0.9)
MONO%: 7.8 % (ref 0.0–14.0)
NEUT#: 1.1 10*3/uL — ABNORMAL LOW (ref 1.5–6.5)
NEUT%: 33.3 % — AB (ref 38.4–76.8)
Platelets: 155 10*3/uL (ref 145–400)
RBC: 4.6 10*6/uL (ref 3.70–5.45)
RDW: 17.8 % — ABNORMAL HIGH (ref 11.2–14.5)
WBC: 3.2 10*3/uL — ABNORMAL LOW (ref 3.9–10.3)
lymph#: 1.8 10*3/uL (ref 0.9–3.3)

## 2016-06-02 LAB — COMPREHENSIVE METABOLIC PANEL
ALT: 70 U/L — AB (ref 0–55)
ANION GAP: 12 meq/L — AB (ref 3–11)
AST: 85 U/L — AB (ref 5–34)
Albumin: 3.4 g/dL — ABNORMAL LOW (ref 3.5–5.0)
Alkaline Phosphatase: 62 U/L (ref 40–150)
BUN: 10.9 mg/dL (ref 7.0–26.0)
CHLORIDE: 100 meq/L (ref 98–109)
CO2: 27 meq/L (ref 22–29)
CREATININE: 0.8 mg/dL (ref 0.6–1.1)
Calcium: 10.1 mg/dL (ref 8.4–10.4)
EGFR: 88 mL/min/{1.73_m2} — ABNORMAL LOW (ref >90)
Glucose: 209 mg/dl — ABNORMAL HIGH (ref 70–140)
Potassium: 3.8 mEq/L (ref 3.5–5.1)
Sodium: 140 mEq/L (ref 136–145)
Total Bilirubin: 0.9 mg/dL (ref 0.20–1.20)
Total Protein: 7.2 g/dL (ref 6.4–8.3)

## 2016-06-02 MED ORDER — HEPARIN SOD (PORK) LOCK FLUSH 100 UNIT/ML IV SOLN
500.0000 [IU] | Freq: Once | INTRAVENOUS | Status: AC | PRN
  Administered 2016-06-02: 500 [IU]
  Filled 2016-06-02: qty 5

## 2016-06-02 MED ORDER — SODIUM CHLORIDE 0.9 % IV SOLN
Freq: Once | INTRAVENOUS | Status: AC
  Administered 2016-06-02: 16:00:00 via INTRAVENOUS

## 2016-06-02 MED ORDER — PROCHLORPERAZINE MALEATE 10 MG PO TABS
10.0000 mg | ORAL_TABLET | Freq: Once | ORAL | Status: AC
  Administered 2016-06-02: 10 mg via ORAL

## 2016-06-02 MED ORDER — PROCHLORPERAZINE MALEATE 10 MG PO TABS
ORAL_TABLET | ORAL | Status: AC
  Filled 2016-06-02: qty 1

## 2016-06-02 MED ORDER — SODIUM CHLORIDE 0.9 % IV SOLN: 550.0000 mg/m2 | Freq: Once | INTRAVENOUS | Status: DC

## 2016-06-02 MED ORDER — PEGFILGRASTIM 6 MG/0.6ML ~~LOC~~ PSKT
6.0000 mg | PREFILLED_SYRINGE | Freq: Once | SUBCUTANEOUS | Status: DC
  Filled 2016-06-02: qty 0.6

## 2016-06-02 MED ORDER — SODIUM CHLORIDE 0.9% FLUSH
10.0000 mL | INTRAVENOUS | Status: DC | PRN
  Administered 2016-06-02: 10 mL
  Filled 2016-06-02: qty 10

## 2016-06-02 MED ORDER — SODIUM CHLORIDE 0.9 % IV SOLN
550.0000 mg/m2 | Freq: Once | INTRAVENOUS | Status: AC
  Administered 2016-06-02: 912 mg via INTRAVENOUS
  Filled 2016-06-02: qty 23.99

## 2016-06-02 MED ORDER — SODIUM CHLORIDE 0.9% FLUSH
10.0000 mL | INTRAVENOUS | Status: DC | PRN
  Administered 2016-06-02: 10 mL via INTRAVENOUS
  Filled 2016-06-02: qty 10

## 2016-06-02 NOTE — Progress Notes (Signed)
Patient Care Team: [name], MD as PCP - General (Family Medicine) [name], MD (Cardiology)  DIAGNOSIS:  Encounter Diagnoses  Name Primary?  . Malignant neoplasm metastatic to right lung ([place]) Yes  . Bilateral malignant neoplasm of breast in female, estrogen receptor positive, unspecified site of breast ([place])     SUMMARY OF ONCOLOGIC HISTORY:   Bilateral breast cancer ([place])   07/23/2013 Mammogram    Bilateral breast masses. 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 N1 M0 stage IIB ER 100%, PR 0%, HER-2       06/17/2014 -  Radiation Therapy    Adjuvant radiation therapy      09/14/2014 -  Anti-estrogen oral therapy    Anastrozole 1 mg daily      05/28/2015 Imaging    CT scans: Enlarging subpectoral masses 3.1 x 3.5 cm, posteriorly lower density mass 4.5 x 2.1 cm, several right-sided lung nodules right lower lobe 1.4 cm, 3 other right lung nodules, 1.7 cm right iliac bone lesion      06/21/2015 Procedure     Left subpectoral mass biopsy: Invasive high-grade ductal carcinoma ER 5%, PR 0%, HER-2 negative ratio 1.29      09/01/2015 Imaging    Left chest wall mass increased in size 7.2 x 5.1 cm, multiple subcutaneous nodules, increase in the lung nodules both in number as well as in the size of existing nodules      09/10/2015 -  Chemotherapy    Carboplatin, gemcitabine days 1 and 8 q 3 weeks, carboplatin discontinued for neuropathy (treatment break from 01/20/2016 to 03/24/2016); Added Carboplatin back 04/14/16 (for progression)      01/26/2016 Imaging    Marked improvement in size of lung nodules with many of the nodules resolved index right lower lobe nodule 1.9 x 1.8 cm is now 0.7 x 0.6 cm, reduction in the size of left breast mass 7.2 cm down to 2.6 cm, enlargement in the hypodense mass in the uterus      04/06/2016 Imaging    CT chest: Interval progression of pre-existing lung nodules new lung metastases (44m, 119m 2.9 cm, 1.6 cm), interval progression of disease in the left breast 4.2 cm (was 2.6 cm), additional nodules 2.3 cm and 3.2 cm; CT head: scalp mass 1.9 cm       CHIEF COMPLIANT: Follow-up on carboplatin and gemcitabine, today is cycle 4  INTERVAL HISTORY: Paula Navarro is a 65 year old with above-mentioned history metastatic breast cancer with the lung nodules and left breast lesion. She is currently on palliative chemotherapy with carboplatin and gemcitabine. We were previously and gemcitabine carboplatin was added when there was progression noted on the scans done on 04/06/2016. She has been tolerating the combination chemotherapy reasonably well. 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 We'll watch this very closely. She didthat it is not any significantly worse. She does have more fatigue. Denies any nausea vomiting. Denies any fevers or chills.  REVIEW OF SYSTEMS:   Constitutional: Denies fevers, chills or abnormal weight loss, complains of fatigue Eyes: Denies  blurriness of vision Ears, nose, mouth, throat, and face: Denies mucositis or sore throat Respiratory: Denies cough, dyspnea or wheezes Cardiovascular: Denies palpitation, chest discomfort Gastrointestinal:  Denies nausea, heartburn or change in bowel habits Skin: Denies abnormal skin rashes Lymphatics: Denies new lymphadenopathy or easy bruising Neurological:Denies numbness, tingling or new weaknesses Behavioral/Psych: Mood is stable, no new changes  Extremities: No lower extremity edema All other systems were reviewed with the patient and are negative.  I have reviewed the past medical history, past surgical history, social history and family history with the patient and they are unchanged from previous note.  ALLERGIES:  is allergic to codeine and morphine and related.  MEDICATIONS:  Current Outpatient Prescriptions  Medication Sig Dispense Refill  . B Complex-C (SUPER B COMPLEX PO) Take 1 tablet by mouth daily.    . calcium-vitamin D (OSCAL-500) 500-400 MG-UNIT tablet Take 1 tablet by mouth 2 (two) times daily. 60 tablet 3  . docusate sodium (COLACE) 100 MG capsule Take 1 capsule (100 mg total) by mouth every 12 (twelve) hours. 60 capsule 0  . gabapentin (NEURONTIN) 300 MG capsule TAKE 2 CAPSULES (600 MG TOTAL) BY MOUTH 3 (THREE) TIMES DAILY. 180 capsule 5  . LORazepam (ATIVAN) 1 MG tablet Take 1 tablet (1 mg total) by mouth at bedtime. 30 tablet 0  . ondansetron (ZOFRAN) 4 MG tablet Take 1 tablet (4 mg total) by mouth every 8 (eight) hours as needed for nausea or vomiting. 30 tablet 0  . oxyCODONE (OXYCONTIN) 10 mg 12 hr tablet Take 1 tablet (10 mg total) by mouth every 12 (twelve) hours. 60 tablet 0  . oxyCODONE-acetaminophen (PERCOCET/ROXICET) 5-325 MG tablet 1 tabs PO q6h prn pain 60 tablet 0  . prochlorperazine (COMPAZINE) 10 MG tablet TAKE 1 TABLET EVERY 6 HOURS AS NEEDED NAUEA OR VOMITING 30 tablet 1  . traZODone (DESYREL) 50 MG tablet Take 1 tablet (50 mg total) by mouth at  bedtime. 7 tablet 0  . zolpidem (AMBIEN) 10 MG tablet Take 1 tablet (10 mg total) by mouth at bedtime as needed for sleep. 30 tablet 2   No current facility-administered medications for this visit.    Facility-Administered Medications Ordered in Other Visits  Medication Dose Route Frequency Provider Last Rate Last Dose  . sodium chloride flush (NS) 0.9 % injection 10 mL  10 mL Intracatheter PRN Nicholas Lose, MD   10 mL at 03/31/16 1352    PHYSICAL EXAMINATION: ECOG PERFORMANCE STATUS: 1 - Symptomatic but completely ambulatory  Vitals:   06/02/16 1148  BP: (!) 142/76  Pulse: 79  Resp: 18  Temp: 98.1 F (36.7 C)   Filed Weights   06/02/16 1148  Weight: 137 lb 3.2 oz (62.2 kg)    GENERAL:alert, no distress and comfortable SKIN: skin color, texture, turgor are normal, no rashes or significant lesions  EYES: normal, Conjunctiva are pink and non-injected, sclera clear OROPHARYNX:no exudate, no erythema and lips, buccal mucosa, and tongue normal  NECK: supple, thyroid normal size, non-tender, without nodularity LYMPH:  no palpable lymphadenopathy in the cervical, axillary or inguinal LUNGS: clear to auscultation and percussion with normal breathing effort HEART: regular rate & rhythm and no murmurs and no lower extremity edema ABDOMEN:abdomen soft, non-tender and normal bowel sounds MUSCULOSKELETAL:no cyanosis of digits and no clubbing  NEURO: alert & oriented x 3 with fluent speech, no focal motor/sensory deficits EXTREMITIES: No lower extremity edema BREAST:Palpable lump in the left axillary and anterior axillary fold. Same as before. No palpable axillary supraclavicular or infraclavicular adenopathy no breast tenderness or nipple discharge. (exam performed in the presence of a chaperone)  LABORATORY DATA:  I have reviewed the data as listed   Chemistry      Component Value Date/Time   NA 140 06/02/2016 1108   K 3.8 06/02/2016 1108   CL 99 (L) 09/20/2015 2209   CO2 27  06/02/2016 1108   BUN 10.9 06/02/2016 1108   CREATININE 0.8 06/02/2016 1108      Component Value Date/Time   CALCIUM 10.1 06/02/2016 1108   ALKPHOS 62 06/02/2016 1108   AST 85 (H) 06/02/2016 1108   ALT 70 (H) 06/02/2016 1108   BILITOT 0.90 06/02/2016 1108       Lab Results  Component Value Date   WBC 3.2 (L) 06/02/2016   HGB 10.6 (L) 06/02/2016   HCT 34.1 (L) 06/02/2016   MCV 74.1 (L) 06/02/2016   PLT 155 06/02/2016   NEUTROABS 1.1 (L) 06/02/2016     ASSESSMENT & PLAN:  Bilateral breast cancer ([place]) Treated with neoadjuvant chemotherapy followed by surgery 04/08/2014, radiation treatment completed 07/17/2014, antiestrogen therapy with anastrozole 09/14/2014 Left breastT2, N0, M0, IDC grade 3; 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 N1 M0 stage IIB Metastatic diseasediagnosed 05/28/15 (subpectoral masses 3.5 cm and 4.5 cm. Four lung nodules the largest 1.4 cm in the right lung 1.7 cm sclerotic right iliac bone lesion) Left subpectoral mass biopsy 06/21/2015: Invasive high-grade ductal carcinoma ER 5%, PR 0%, HER-2 negative ratio 1.29 Prior treatment:  1. Xeloda 1500 mg by mouth twice a day started 07/07/2015 stopped 09/03/2015 (stopped for progression) 2. Carboplatin day 1 and gemcitabine days 1 and 8 every 3 weeks started 09/10/2015; completed cycle 5 01/20/2016 (carboplatin discontinued for neuropathy) 3. Complete treatment break for 2 months ------------------------------------------------------------------------------------------------------------------------------------------ Current treatment: Carboplatin and gemcitabine (carboplatin added because of progression of disease 04/14/2016), today is cycle 2 day 8 q 3 weeks  CT chest 04/06/2016: Interval progression of pre-existing lung nodules new lung metastases  (19m, 160m 2.9 cm, 1.6 cm), interval progression of disease in the left breast 4.2 cm (was 2.6 cm), additional nodules 2.3 cm and 3.2 cm; CT head: scalp mass 1.9 cm  Chemotherapy toxicities: 1. Fatigue 2. Anemia due to chemotherapy: hgb improved mildly to 9.0 3. Severe leukocytosis with neutrophils markedly increased due to Neulasta: Neulasta discontinued , will resume Neulasta because of neutropenia 4. Grade 2 neuropathy  Pain issues: Oxycontin for long acting pain medication. Pain well controlled Insomnia: D/Ced Ativan and started restoril. Advised to try melatonin 71m70ms well Lymphedema: physical therapy. Continue to wear compression sleeves daily. Bone mets: Xgeva q 6 weeks with calcium and Vit D  Patient does make that the chest wall tumor is increasing in size. I do not see any noticeable change. Because of this I Would like to Perform CT of the Chest and Evaluate Her before Her Next Treatment. Patient wishes to get a second opinion at [facility]. We will make a referral.  Return to clinic in 2 weeks for cycle 3 day 1. We would like to see the scans prior to that cycle.   Orders Placed This Encounter  Procedures  . CT Chest W Contrast    Standing Status:   Future    Standing Expiration Date:   06/02/2017    Order Specific Question:   If indicated for the ordered procedure, I authorize the administration of contrast media per Radiology protocol    Answer:   Yes    Order Specific Question:   Reason for Exam (SYMPTOM  OR DIAGNOSIS REQUIRED)    Answer:   Metastatic Breast cancer    Order Specific Question:   Preferred imaging location?    Answer:   [facility]   The patient has a good understanding of the overall plan. she agrees with it. she will call with any problems that may develop before the next visit here.   Rulon Eisenmenger, MD 06/02/16

## 2016-06-02 NOTE — Progress Notes (Signed)
Referral entered for pt to be seen at second opinion clinic at [facility] per Dr. Lindi Adie.  Order entered and inbox message sent to HIM.

## 2016-06-02 NOTE — Progress Notes (Signed)
Patient refused Onpro today. Patient scheduled for injection on Monday 06/05/16. Patient informed of new appointment and verbalized understanding.

## 2016-06-02 NOTE — Patient Instructions (Addendum)
[facility] Discharge Instructions for Patients Receiving Chemotherapy  Today you received the following chemotherapy agents:  Gemzar  To help prevent nausea and vomiting after your treatment, we encourage you to take your nausea medication as ordered per MD.   If you develop nausea and vomiting that is not controlled by your nausea medication, call the clinic.   BELOW ARE SYMPTOMS THAT SHOULD BE REPORTED IMMEDIATELY:  *FEVER GREATER THAN 100.5 F  *CHILLS WITH OR WITHOUT FEVER  NAUSEA AND VOMITING THAT IS NOT CONTROLLED WITH YOUR NAUSEA MEDICATION  *UNUSUAL SHORTNESS OF BREATH  *UNUSUAL BRUISING OR BLEEDING  TENDERNESS IN MOUTH AND THROAT WITH OR WITHOUT PRESENCE OF ULCERS  *URINARY PROBLEMS  *BOWEL PROBLEMS  UNUSUAL RASH Items with * indicate a potential emergency and should be followed up as soon as possible.  Feel free to call the clinic you have any questions or concerns. The clinic phone number is [phone].  Please show the CHEMO ALERT CARD at check-in to the Emergency Department and triage nurse.   

## 2016-06-02 NOTE — Progress Notes (Signed)
Okay to treat today despite ANC 1.1 per Dr. Lindi Adie.

## 2016-06-03 ENCOUNTER — Other Ambulatory Visit: Payer: Self-pay | Admitting: Hematology and Oncology

## 2016-06-03 DIAGNOSIS — C50112 Malignant neoplasm of central portion of left female breast: Principal | ICD-10-CM

## 2016-06-03 DIAGNOSIS — C50111 Malignant neoplasm of central portion of right female breast: Secondary | ICD-10-CM

## 2016-06-05 ENCOUNTER — Encounter: Payer: Self-pay | Admitting: Nutrition

## 2016-06-05 ENCOUNTER — Ambulatory Visit (HOSPITAL_BASED_OUTPATIENT_CLINIC_OR_DEPARTMENT_OTHER): Payer: Medicare Other

## 2016-06-05 ENCOUNTER — Telehealth: Payer: Self-pay | Admitting: Medical Oncology

## 2016-06-05 VITALS — BP 124/77 | HR 80 | Temp 98.9°F | Resp 18

## 2016-06-05 DIAGNOSIS — D6481 Anemia due to antineoplastic chemotherapy: Secondary | ICD-10-CM | POA: Diagnosis not present

## 2016-06-05 DIAGNOSIS — Z170 Estrogen receptor positive status [ER+]: Secondary | ICD-10-CM

## 2016-06-05 DIAGNOSIS — C50912 Malignant neoplasm of unspecified site of left female breast: Secondary | ICD-10-CM

## 2016-06-05 DIAGNOSIS — C50919 Malignant neoplasm of unspecified site of unspecified female breast: Secondary | ICD-10-CM

## 2016-06-05 DIAGNOSIS — C50911 Malignant neoplasm of unspecified site of right female breast: Secondary | ICD-10-CM | POA: Diagnosis not present

## 2016-06-05 DIAGNOSIS — Z5111 Encounter for antineoplastic chemotherapy: Secondary | ICD-10-CM

## 2016-06-05 MED ORDER — PEGFILGRASTIM INJECTION 6 MG/0.6ML ~~LOC~~
6.0000 mg | PREFILLED_SYRINGE | Freq: Once | SUBCUTANEOUS | Status: AC
  Administered 2016-06-05: 6 mg via SUBCUTANEOUS
  Filled 2016-06-05: qty 0.6

## 2016-06-05 NOTE — Telephone Encounter (Signed)
I tried to contact pt ,but unable to leave message.

## 2016-06-05 NOTE — Patient Instructions (Signed)
Pegfilgrastim injection What is this medicine? PEGFILGRASTIM (PEG fil gra stim) is a long-acting granulocyte colony-stimulating factor that stimulates the growth of neutrophils, a type of white blood cell important in the body's fight against infection. It is used to reduce the incidence of fever and infection in patients with certain types of cancer who are receiving chemotherapy that affects the bone marrow, and to increase survival after being exposed to high doses of radiation. This medicine may be used for other purposes; ask your health care provider or pharmacist if you have questions. What should I tell my health care provider before I take this medicine? They need to know if you have any of these conditions: -kidney disease -latex allergy -ongoing radiation therapy -sickle cell disease -skin reactions to acrylic adhesives (On-Body Injector only) -an unusual or allergic reaction to pegfilgrastim, filgrastim, other medicines, foods, dyes, or preservatives -pregnant or trying to get pregnant -breast-feeding How should I use this medicine? This medicine is for injection under the skin. If you get this medicine at home, you will be taught how to prepare and give the pre-filled syringe or how to use the On-body Injector. Refer to the patient Instructions for Use for detailed instructions. Use exactly as directed. Take your medicine at regular intervals. Do not take your medicine more often than directed. It is important that you put your used needles and syringes in a special sharps container. Do not put them in a trash can. If you do not have a sharps container, call your pharmacist or healthcare provider to get one. Talk to your pediatrician regarding the use of this medicine in children. While this drug may be prescribed for selected conditions, precautions do apply. Overdosage: If you think you have taken too much of this medicine contact a poison control center or emergency room at  once. NOTE: This medicine is only for you. Do not share this medicine with others. What if I miss a dose? It is important not to miss your dose. Call your doctor or health care professional if you miss your dose. If you miss a dose due to an On-body Injector failure or leakage, a new dose should be administered as soon as possible using a single prefilled syringe for manual use. What may interact with this medicine? Interactions have not been studied. Give your health care provider a list of all the medicines, herbs, non-prescription drugs, or dietary supplements you use. Also tell them if you smoke, drink alcohol, or use illegal drugs. Some items may interact with your medicine. This list may not describe all possible interactions. Give your health care provider a list of all the medicines, herbs, non-prescription drugs, or dietary supplements you use. Also tell them if you smoke, drink alcohol, or use illegal drugs. Some items may interact with your medicine. What should I watch for while using this medicine? You may need blood work done while you are taking this medicine. If you are going to need a MRI, CT scan, or other procedure, tell your doctor that you are using this medicine (On-Body Injector only). What side effects may I notice from receiving this medicine? Side effects that you should report to your doctor or health care professional as soon as possible: -allergic reactions like skin rash, itching or hives, swelling of the face, lips, or tongue -dizziness -fever -pain, redness, or irritation at site where injected -pinpoint red spots on the skin -red or dark-brown urine -shortness of breath or breathing problems -stomach or side pain, or pain   at the shoulder -swelling -tiredness -trouble passing urine or change in the amount of urine Side effects that usually do not require medical attention (report to your doctor or health care professional if they continue or are  bothersome): -bone pain -muscle pain This list may not describe all possible side effects. Call your doctor for medical advice about side effects. You may report side effects to FDA at 1-800-FDA-1088. Where should I keep my medicine? Keep out of the reach of children. Store pre-filled syringes in a refrigerator between 2 and 8 degrees C (36 and 46 degrees F). Do not freeze. Keep in carton to protect from light. Throw away this medicine if it is left out of the refrigerator for more than 48 hours. Throw away any unused medicine after the expiration date. NOTE: This sheet is a summary. It may not cover all possible information. If you have questions about this medicine, talk to your doctor, pharmacist, or health care provider.    2016, [employer]. (2014-08-06 14:30:14)  

## 2016-06-05 NOTE — Progress Notes (Signed)
Patient requested information on a low fat diet. I mailed this information to her.

## 2016-06-08 ENCOUNTER — Emergency Department (HOSPITAL_COMMUNITY)
Admission: EM | Admit: 2016-06-08 | Discharge: 2016-06-09 | Disposition: A | Discharge status: Home / Self Care | Payer: Medicare Other | Attending: Emergency Medicine | Admitting: Emergency Medicine

## 2016-06-08 ENCOUNTER — Telehealth: Payer: Self-pay

## 2016-06-08 ENCOUNTER — Encounter (HOSPITAL_COMMUNITY): Payer: Self-pay | Admitting: *Deleted

## 2016-06-08 DIAGNOSIS — J449 Chronic obstructive pulmonary disease, unspecified: Secondary | ICD-10-CM | POA: Diagnosis not present

## 2016-06-08 DIAGNOSIS — Z87891 Personal history of nicotine dependence: Secondary | ICD-10-CM | POA: Insufficient documentation

## 2016-06-08 DIAGNOSIS — Z79899 Other long term (current) drug therapy: Secondary | ICD-10-CM | POA: Diagnosis not present

## 2016-06-08 DIAGNOSIS — M549 Dorsalgia, unspecified: Secondary | ICD-10-CM

## 2016-06-08 DIAGNOSIS — R221 Localized swelling, mass and lump, neck: Secondary | ICD-10-CM | POA: Insufficient documentation

## 2016-06-08 DIAGNOSIS — Z853 Personal history of malignant neoplasm of breast: Secondary | ICD-10-CM | POA: Diagnosis not present

## 2016-06-08 NOTE — Telephone Encounter (Signed)
Received team health notification regarding pt call last night due to increased HR 130's and pt symptoms of " feeling like a coke fizzing from my chest. Side of my neck that the port is on is swollen and tender."  Called pt to follow up with symptoms and states " I don't feel it now. I called ems last night and they stayed with me for 30 minutes. I feel fine now and my hr this morning is 90." Pt also denies any sob, chest pain or dizziness. Asked pt if she is hydrating enough or having any diarrhea. Pt states that she hasn't been drinking much fluids, but will start today. Pt did report " My neck is still swollen but not tender at this time." Told pt come in for an evaluation of her neck today to r/o SVC syndrome or any clot that may be present, given her current cancer diagnosis and new onset of cardiac symptoms. Asked pt if she can stop by today if possible. Pt states that she will need to ask her sister if she can come this afternoon or tomorrow morning. "Will call you back after I ask my sister".

## 2016-06-08 NOTE — ED Triage Notes (Signed)
Pt arrived by EMS complaining of lower back pain that started this afternoon. Pt says pain is not constant.

## 2016-06-08 NOTE — Progress Notes (Signed)
Pt called back and will not be able to come in today for evaluation for neck swelling. Pt stated that she will come in at 11am tomorrow. Placed schedule request for appt for SM tomorrow.

## 2016-06-09 ENCOUNTER — Emergency Department (HOSPITAL_COMMUNITY): Payer: Medicare Other

## 2016-06-09 ENCOUNTER — Encounter: Payer: Medicare Other | Admitting: Nurse Practitioner

## 2016-06-09 ENCOUNTER — Telehealth: Payer: Self-pay | Admitting: *Deleted

## 2016-06-09 LAB — CBC WITH DIFFERENTIAL/PLATELET
BASOS ABS: 0 10*3/uL (ref 0.0–0.1)
Basophils Relative: 0 %
Eosinophils Absolute: 0 10*3/uL (ref 0.0–0.7)
Eosinophils Relative: 0 %
HCT: 28.2 % — ABNORMAL LOW (ref 36.0–46.0)
Hemoglobin: 9 g/dL — ABNORMAL LOW (ref 12.0–15.0)
LYMPHS PCT: 16 %
Lymphs Abs: 3.4 10*3/uL (ref 0.7–4.0)
MCH: 23.8 pg — ABNORMAL LOW (ref 26.0–34.0)
MCHC: 31.9 g/dL (ref 30.0–36.0)
MCV: 74.6 fL — ABNORMAL LOW (ref 78.0–100.0)
MONO ABS: 1.6 10*3/uL — AB (ref 0.1–1.0)
Monocytes Relative: 8 %
NEUTROS ABS: 16.1 10*3/uL — AB (ref 1.7–7.7)
NEUTROS PCT: 76 %
PLATELETS: 19 10*3/uL — AB (ref 150–400)
RBC: 3.78 MIL/uL — AB (ref 3.87–5.11)
RDW: 17.2 % — AB (ref 11.5–15.5)
SMEAR REVIEW: DECREASED
WBC: 21.2 10*3/uL — AB (ref 4.0–10.5)

## 2016-06-09 LAB — COMPREHENSIVE METABOLIC PANEL
ALBUMIN: 3.6 g/dL (ref 3.5–5.0)
ALT: 44 U/L (ref 14–54)
ANION GAP: 7 (ref 5–15)
AST: 49 U/L — ABNORMAL HIGH (ref 15–41)
Alkaline Phosphatase: 92 U/L (ref 38–126)
BILIRUBIN TOTAL: 0.7 mg/dL (ref 0.3–1.2)
BUN: 11 mg/dL (ref 6–20)
CO2: 30 mmol/L (ref 22–32)
Calcium: 9.4 mg/dL (ref 8.9–10.3)
Chloride: 102 mmol/L (ref 101–111)
Creatinine, Ser: 0.88 mg/dL (ref 0.44–1.00)
GFR calc non Af Amer: >60 mL/min (ref >60)
GLUCOSE: 126 mg/dL — AB (ref 65–99)
POTASSIUM: 3.4 mmol/L — AB (ref 3.5–5.1)
Sodium: 139 mmol/L (ref 135–145)
TOTAL PROTEIN: 6.6 g/dL (ref 6.5–8.1)

## 2016-06-09 LAB — URINALYSIS, ROUTINE W REFLEX MICROSCOPIC
Bilirubin Urine: NEGATIVE
Glucose, UA: NEGATIVE mg/dL
KETONES UR: NEGATIVE mg/dL
NITRITE: NEGATIVE
PROTEIN: NEGATIVE mg/dL
Specific Gravity, Urine: <1.005 — ABNORMAL LOW (ref 1.005–1.030)
pH: 7 (ref 5.0–8.0)

## 2016-06-09 LAB — URINE MICROSCOPIC-ADD ON

## 2016-06-09 MED ORDER — HYDROCODONE-ACETAMINOPHEN 5-325 MG PO TABS: 1.0000 | ORAL_TABLET | ORAL | 0 refills | Status: DC | PRN

## 2016-06-09 MED ORDER — HYDROMORPHONE HCL 1 MG/ML IJ SOLN
1.0000 mg | Freq: Once | INTRAMUSCULAR | Status: AC
  Administered 2016-06-09: 1 mg via INTRAMUSCULAR
  Filled 2016-06-09: qty 1

## 2016-06-09 NOTE — ED Provider Notes (Signed)
[facility] DEPT Provider Note   CSN: [id number] Arrival date & time: 06/08/16  2253  By signing my name below, I, Gwenlyn Fudge, attest that this documentation has been prepared under the direction and in the presence of Ripley Fraise, MD. Electronically Signed: Gwenlyn Fudge, ED Scribe. 06/09/16. 12:16 AM.   History   Chief Complaint Chief Complaint  Patient presents with  . Back Pain   The history is provided by the patient. No language interpreter was used.  Back Pain   This is a new problem. The current episode started 12 to 24 hours ago. The problem occurs hourly. The problem has not changed since onset.The pain is associated with no known injury. The quality of the pain is described as stabbing. The pain does not radiate. The pain is moderate. The pain is the same all the time. Pertinent negatives include no chest pain, no fever, no abdominal pain, no dysuria and no weakness. Risk factors include a history of cancer.   HPI Comments: Paula Navarro is a 65 y.o. female with PMHx of COPD and bilateral breast cancer who presents to the Emergency Department complaining of gradual onset, intermittent, sharp back pain onset 3 PM today. It began on the right side and then radiated towards the lower back. She states "the pain is from the inside". Pt has not had similar back pain. She had chemotherapy treatment on 06/02/2016. Denies abdominal pain. She denies trauma Past Medical History:  Diagnosis Date  . Anemia   . Anxiety   . Atrial septal defect 1996   Surgical repair in 1996  . Breast cancer ([place])   . Chest pain    Admitted to [facility] in 09/2011; refused stress test  . Chronic bronchitis   . Chronic pain   . COPD (chronic obstructive pulmonary disease) ([place])    on xray  . Palpitation    Tachycardia reported by monitor clerk during a symptomatic spell  . Radiation 06/30/14-08/17/14   Bilateral Breast  . Tobacco abuse    60 pack years; 1.5 packs per day  . Wears dentures    top     Patient Active Problem List   Diagnosis Date Noted  . Hyperglycemia 04/01/2016  . Scalp lesion 04/01/2016  . Encounter for central line care 12/08/2015  . Port catheter in place 11/19/2015  . Insomnia 11/01/2015  . Microcytic anemia 09/28/2015  . Lung metastases ([place]) 09/10/2015  . Bone metastases ([place]) 07/20/2015  . Encounter for chemotherapy management 07/13/2015  . Metastatic breast cancer ([place]) 06/29/2015  . Chronic pain 03/16/2015  . Rash 09/24/2014  . Vaginal bleeding 09/24/2014  . Postherpetic neuralgia 09/03/2014  . Lymphedema 09/03/2014  . Suspected herpes zoster left C5 distribution 08/14/2014  . Neuropathy due to chemotherapeutic drug ([place]) 03/03/2014  . Hand foot syndrome 03/03/2014  . Anxiety 03/03/2014  . Bilateral breast cancer ([place]) 08/11/2013  . Palpitation   . Chest pain   . Atrial septal defect   . Laboratory test 11/25/2011  . Chronic bronchitis   . Tobacco abuse     Past Surgical History:  Procedure Laterality Date  . ASD REPAIR, OSTIUM PRIMUM  1996   dr Roxy Horseman  . AXILLARY LYMPH NODE DISSECTION Bilateral 04/08/2014   Procedure:  BILATERAL AXILLARY LYMPH NODE DISSECTION;  Surgeon: Autumn Messing III, MD;  Location: [facility];  Service: General;  Laterality: Bilateral;  . BREAST BIOPSY Bilateral   . BREAST LUMPECTOMY WITH RADIOACTIVE SEED LOCALIZATION Bilateral 04/08/2014   Procedure: BILATERAL  RADIOACTIVE  SEED LOCALIZATION LUMPECTOMY ;  Surgeon: Autumn Messing III, MD;  Location: [facility];  Service: General;  Laterality: Bilateral;  . CESAREAN SECTION     x3  . CHOLECYSTECTOMY    . open heart surgery    . PORT A CATH REVISION  1/15   put in   . TUBAL LIGATION      OB History    Gravida Para Term Preterm AB Living   '4 3 3         '$ SAB TAB Ectopic Multiple Live Births                   Home Medications    Prior to Admission medications   Medication Sig Start Date End Date Taking? Authorizing Provider  B Complex-C  (SUPER B COMPLEX PO) Take 1 tablet by mouth daily.   Yes Historical Provider, MD  calcium-vitamin D (OSCAL-500) 500-400 MG-UNIT tablet Take 1 tablet by mouth 2 (two) times daily. 08/18/15  Yes Nicholas Lose, MD  docusate sodium (COLACE) 100 MG capsule Take 1 capsule (100 mg total) by mouth every 12 (twelve) hours. 09/21/15  Yes Tanna Furry, MD  gabapentin (NEURONTIN) 300 MG capsule TAKE 2 CAPSULES (600 MG TOTAL) BY MOUTH 3 (THREE) TIMES DAILY. 06/05/16  Yes Nicholas Lose, MD  LORazepam (ATIVAN) 1 MG tablet Take 1 tablet (1 mg total) by mouth at bedtime. 04/07/16  Yes Nicholas Lose, MD  ondansetron (ZOFRAN) 4 MG tablet Take 1 tablet (4 mg total) by mouth every 8 (eight) hours as needed for nausea or vomiting. 05/26/16  Yes Nicholas Lose, MD  oxyCODONE (OXYCONTIN) 10 mg 12 hr tablet Take 1 tablet (10 mg total) by mouth every 12 (twelve) hours. 03/31/16  Yes Susanne Borders, NP  oxyCODONE-acetaminophen (PERCOCET/ROXICET) 5-325 MG tablet 1 tabs PO q6h prn pain 05/16/16  Yes Nicholas Lose, MD  prochlorperazine (COMPAZINE) 10 MG tablet TAKE 1 TABLET EVERY 6 HOURS AS NEEDED NAUEA OR VOMITING 04/17/16  Yes Nicholas Lose, MD  traZODone (DESYREL) 50 MG tablet Take 1 tablet (50 mg total) by mouth at bedtime. 10/27/15  Yes Nicholas Lose, MD  zolpidem (AMBIEN) 10 MG tablet Take 1 tablet (10 mg total) by mouth at bedtime as needed for sleep. 04/14/16 05/14/16  Nicholas Lose, MD    Family History Family History  Problem Relation Age of Onset  . Breast cancer Maternal Aunt     Social History Social History  Substance Use Topics  . Smoking status: Former Smoker    Packs/day: 1.50    Years: 40.00    Types: Cigarettes    Quit date: 08/01/2012  . Smokeless tobacco: Never Used  . Alcohol use No     Allergies   Codeine and Morphine and related   Review of Systems Review of Systems  Constitutional: Negative for fever.  Respiratory: Negative for cough and shortness of breath.   Cardiovascular: Negative for chest pain.    Gastrointestinal: Negative for abdominal pain, nausea and vomiting.  Genitourinary: Negative for dysuria and frequency.       Urinary incontinence is baseline  Musculoskeletal: Positive for back pain.       + Slight neck swelling  Neurological: Negative for weakness.  All other systems reviewed and are negative.  Physical Exam Updated Vital Signs BP 127/70 (BP Location: Left Arm)   Pulse 96   Temp 98.4 F (36.9 C) (Oral)   Resp 20   Ht '5\' 4"'$  (1.626 m)   Wt 137 lb (62.1  kg)   LMP 05/20/2011   SpO2 99%   BMI 23.52 kg/m   Physical Exam CONSTITUTIONAL: Well developed/well nourished HEAD: Normocephalic/atraumatic EYES: EOMI/PERRL ENMT: Mucous membranes moist NECK: supple no meningeal signs, no significant edema noted to upper chest/neck SPINE/BACK:entire spine nontender, No bruising/crepitance/stepoffs noted to spine CV: S1/S2 noted, no murmurs/rubs/gallops noted LUNGS: Lungs are clear to auscultation bilaterally, no apparent distress CHEST: Port noted to right upper chest, no erythema or edema noted, tumor noted to palpation on left chest wall ABDOMEN: soft, nontender, no rebound or guarding GU:no cva tenderness NEURO: Awake/alert, equal motor 5/5 strength noted with the following: hip flexion/knee flexion/extension, foot dorsi/plantar flexion, great toe extension intact bilaterally, no sensory deficit in any dermatome. Pt is able to ambulate unassisted. EXTREMITIES: pulses normal, full ROM SKIN: warm, color normal PSYCH: no abnormalities of mood noted, alert and oriented to situation   ED Treatments / Results  DIAGNOSTIC STUDIES: Oxygen Saturation is 99% on RA, normal by my interpretation.    COORDINATION OF CARE: 12:06 AM Discussed treatment plan with pt at bedside which includes DG Lumbar Spine, DG Thoracic Spine, Urinalysis, CMP and CBC and pt agreed to plan.   Labs (all labs ordered are listed, but only abnormal results are displayed) Labs Reviewed  COMPREHENSIVE  METABOLIC PANEL - Abnormal; Notable for the following:       Result Value   Potassium 3.4 (*)    Glucose, Bld 126 (*)    AST 49 (*)    All other components within normal limits  CBC WITH DIFFERENTIAL/PLATELET - Abnormal; Notable for the following:    WBC 21.2 (*)    RBC 3.78 (*)    Hemoglobin 9.0 (*)    HCT 28.2 (*)    MCV 74.6 (*)    MCH 23.8 (*)    RDW 17.2 (*)    Platelets 19 (*)    Neutro Abs 16.1 (*)    Monocytes Absolute 1.6 (*)    All other components within normal limits  URINALYSIS, ROUTINE W REFLEX MICROSCOPIC (NOT AT [facility]) - Abnormal; Notable for the following:    Color, Urine STRAW (*)    Specific Gravity, Urine <1.005 (*)    Hgb urine dipstick LARGE (*)    Leukocytes, UA TRACE (*)    All other components within normal limits  URINE MICROSCOPIC-ADD ON - Abnormal; Notable for the following:    Squamous Epithelial / LPF 0-5 (*)    Bacteria, UA FEW (*)    All other components within normal limits    EKG  EKG Interpretation None       Radiology Dg Thoracic Spine 2 View  Result Date: 06/09/2016 CLINICAL DATA:  Back pain EXAM: LUMBAR SPINE - COMPLETE 4+ VIEW; THORACIC SPINE 2 VIEWS COMPARISON:  None. FINDINGS: There is a right chest wall Port-A-Cath with tip in the proximal right atrium. Alignment of the thoracic spine is normal. There is no compression fracture. No advanced degenerative disease. There are 5 non-rib-bearing lumbar vertebral bodies. Oblique view show no evidence of pars interarticularis fracture. There is no listhesis. There is facet hypertrophy at L5-S1. Disc spaces and vertebral body heights are preserved. IMPRESSION: No compression fracture or static subluxation of the thoracic or lumbar spine. Electronically Signed   By: Ulyses Jarred M.D.   On: 06/09/2016 00:45   Dg Lumbar Spine Complete  Result Date: 06/09/2016 CLINICAL DATA:  Back pain EXAM: LUMBAR SPINE - COMPLETE 4+ VIEW; THORACIC SPINE 2 VIEWS COMPARISON:  None. FINDINGS: There is a  right  chest wall Port-A-Cath with tip in the proximal right atrium. Alignment of the thoracic spine is normal. There is no compression fracture. No advanced degenerative disease. There are 5 non-rib-bearing lumbar vertebral bodies. Oblique view show no evidence of pars interarticularis fracture. There is no listhesis. There is facet hypertrophy at L5-S1. Disc spaces and vertebral body heights are preserved. IMPRESSION: No compression fracture or static subluxation of the thoracic or lumbar spine. Electronically Signed   By: Ulyses Jarred M.D.   On: 06/09/2016 00:45    Procedures Procedures (including critical care time)  Medications Ordered in ED Medications  HYDROmorphone (DILAUDID) injection 1 mg (1 mg Intramuscular Given 06/09/16 0035)  I personally performed the services described in this documentation, which was scribed in my presence. The recorded information has been reviewed and is accurate.      Initial Impression / Assessment and Plan / ED Course  I have reviewed the triage vital signs and the nursing notes.  Pertinent labs & imaging results that were available during my care of the patient were reviewed by me and considered in my medical decision making (see chart for details).  Clinical Course       Pt with h/o breast CA, on chemo presents with back pain Denies new leg weakness No NEW incontinence Denies trauma She reports she is improving No ABD pain reported (previous CT imaging did not reveal AAA) She reports pain was in mid-lower back region, no upper back pain or chest pain reported to suggest PE.  It was not reproducible here  Workup largely reassuring, though CBC abnormal but pt is on chemo Xray does not reveal tumor She appears well for d/c home She has close followup with PCP already arranged  Final Clinical Impressions(s) / ED Diagnoses   Final diagnoses:  Acute back pain, unspecified back location, unspecified back pain laterality    New Prescriptions New  Prescriptions   No medications on file     Ripley Fraise, MD 06/09/16 0630

## 2016-06-09 NOTE — ED Notes (Signed)
Pt alert & oriented x4, stable gait. Patient given discharge instructions, paperwork & prescription(s). Patient informed not to drive, operate any equipment & handel any important documents 4 hours after taking pain medication. Patient  instructed to stop at the registration desk to finish any additional paperwork. Patient  verbalized understanding. Pt left department w/ no further questions. 

## 2016-06-09 NOTE — Discharge Instructions (Signed)

## 2016-06-09 NOTE — Telephone Encounter (Signed)
TCT to patient to check on her as she did not show up for her [facility] appt today.  Records indicate that pt was in ED last night for back pain and was discharged home. TC to patient and there was no answer and was unable to leave VM. Of note-pt's platelet count was 19K in ED last night.  Attempted call to pt's son, Kennyth Lose (her emergency contact) No answer but left vm for son to call us on Monday with pt status.

## 2016-06-09 NOTE — ED Notes (Signed)
CRITICAL VALUE ALERT  Critical value received:  Platelets 19  Date of notification:  06/09/16  Time of notification:  0218  Critical value read back:Yes.    Nurse who received alert:  Joellyn Rued, RN  MD notified (1st page):  Dr Christy Gentles  Time of first page:  2018  MD notified (2nd page):  Time of second page:  Responding MD:  Dr Christy Gentles  Time MD responded:  [phone]

## 2016-06-12 ENCOUNTER — Telehealth: Payer: Self-pay

## 2016-06-12 ENCOUNTER — Ambulatory Visit (HOSPITAL_BASED_OUTPATIENT_CLINIC_OR_DEPARTMENT_OTHER): Payer: Medicare Other | Admitting: Nurse Practitioner

## 2016-06-12 ENCOUNTER — Other Ambulatory Visit: Payer: Self-pay

## 2016-06-12 ENCOUNTER — Ambulatory Visit (HOSPITAL_BASED_OUTPATIENT_CLINIC_OR_DEPARTMENT_OTHER): Payer: Medicare Other

## 2016-06-12 VITALS — BP 134/80 | HR 81 | Temp 97.7°F | Resp 17 | Ht 64.0 in | Wt 138.2 lb

## 2016-06-12 DIAGNOSIS — Z170 Estrogen receptor positive status [ER+]: Secondary | ICD-10-CM

## 2016-06-12 DIAGNOSIS — C50012 Malignant neoplasm of nipple and areola, left female breast: Principal | ICD-10-CM

## 2016-06-12 DIAGNOSIS — G893 Neoplasm related pain (acute) (chronic): Secondary | ICD-10-CM

## 2016-06-12 DIAGNOSIS — C50512 Malignant neoplasm of lower-outer quadrant of left female breast: Principal | ICD-10-CM

## 2016-06-12 DIAGNOSIS — C50912 Malignant neoplasm of unspecified site of left female breast: Secondary | ICD-10-CM

## 2016-06-12 DIAGNOSIS — C50911 Malignant neoplasm of unspecified site of right female breast: Secondary | ICD-10-CM

## 2016-06-12 DIAGNOSIS — C50511 Malignant neoplasm of lower-outer quadrant of right female breast: Secondary | ICD-10-CM

## 2016-06-12 DIAGNOSIS — C7989 Secondary malignant neoplasm of other specified sites: Secondary | ICD-10-CM | POA: Diagnosis not present

## 2016-06-12 DIAGNOSIS — C50011 Malignant neoplasm of nipple and areola, right female breast: Secondary | ICD-10-CM

## 2016-06-12 DIAGNOSIS — C50919 Malignant neoplasm of unspecified site of unspecified female breast: Secondary | ICD-10-CM

## 2016-06-12 DIAGNOSIS — D6959 Other secondary thrombocytopenia: Secondary | ICD-10-CM

## 2016-06-12 DIAGNOSIS — R21 Rash and other nonspecific skin eruption: Secondary | ICD-10-CM

## 2016-06-12 DIAGNOSIS — T451X5A Adverse effect of antineoplastic and immunosuppressive drugs, initial encounter: Secondary | ICD-10-CM

## 2016-06-12 LAB — COMPREHENSIVE METABOLIC PANEL
ALBUMIN: 3.3 g/dL — AB (ref 3.5–5.0)
ALK PHOS: 154 U/L — AB (ref 40–150)
ALT: 46 U/L (ref 0–55)
ANION GAP: 11 meq/L (ref 3–11)
AST: 60 U/L — ABNORMAL HIGH (ref 5–34)
BILIRUBIN TOTAL: 0.42 mg/dL (ref 0.20–1.20)
BUN: 8.3 mg/dL (ref 7.0–26.0)
CO2: 28 meq/L (ref 22–29)
CREATININE: 0.9 mg/dL (ref 0.6–1.1)
Calcium: 10 mg/dL (ref 8.4–10.4)
Chloride: 102 mEq/L (ref 98–109)
EGFR: 81 mL/min/{1.73_m2} — AB (ref >90)
GLUCOSE: 173 mg/dL — AB (ref 70–140)
Potassium: 3.7 mEq/L (ref 3.5–5.1)
SODIUM: 141 meq/L (ref 136–145)
TOTAL PROTEIN: 7 g/dL (ref 6.4–8.3)

## 2016-06-12 LAB — CBC WITH DIFFERENTIAL/PLATELET
BASO%: 0.7 % (ref 0.0–2.0)
BASOS ABS: 0.2 10*3/uL — AB (ref 0.0–0.1)
EOS ABS: 0.1 10*3/uL (ref 0.0–0.5)
EOS%: 0.6 % (ref 0.0–7.0)
HCT: 29.3 % — ABNORMAL LOW (ref 34.8–46.6)
HGB: 9.5 g/dL — ABNORMAL LOW (ref 11.6–15.9)
LYMPH#: 3.5 10*3/uL — AB (ref 0.9–3.3)
LYMPH%: 14.9 % (ref 14.0–49.7)
MCH: 23.9 pg — ABNORMAL LOW (ref 25.1–34.0)
MCHC: 32.4 g/dL (ref 31.5–36.0)
MCV: 73.8 fL — AB (ref 79.5–101.0)
MONO#: 2.7 10*3/uL — ABNORMAL HIGH (ref 0.1–0.9)
MONO%: 11.8 % (ref 0.0–14.0)
NEUT%: 72 % (ref 38.4–76.8)
NEUTROS ABS: 16.7 10*3/uL — AB (ref 1.5–6.5)
NRBC: 2 % — AB (ref 0–0)
PLATELETS: 44 10*3/uL — AB (ref 145–400)
RBC: 3.97 10*6/uL (ref 3.70–5.45)
RDW: 18.3 % — AB (ref 11.2–14.5)
WBC: 23.2 10*3/uL — AB (ref 3.9–10.3)

## 2016-06-12 MED ORDER — OXYCODONE-ACETAMINOPHEN 5-325 MG PO TABS: ORAL_TABLET | ORAL | 0 refills | Status: DC

## 2016-06-12 NOTE — Telephone Encounter (Signed)
Called pt back since she had records from ED last Friday with back pain and + neck swelling. Pt unable to make to her sm appt on Friday. Spoke to pt and states that she is doing better today. Told pt that according to her lab work that was done in the ED, her platelets dropped significantly compared to the labs from 10 days ago. Pt last chemotherapy was on 06/02/16. Pt states that she does have some hematuria that she has been having for about 2 years now, on and off. Pt states that she doesn't really have " that bad of a back pain, but I do need some pain medication for it" Explained to pt that dropping her platelets that low is a call for a concern and we need to make sure that this was addressed today. Pt did not have platelets addressed while she was in the ED Friday. Pt states that she will have to have her sister bring her in today to be seen right away. Sent message high priority to scheduling dept and to add stat labs for symptom management as well. Pt will be here around 1030 this morning.

## 2016-06-13 ENCOUNTER — Encounter: Payer: Self-pay | Admitting: Nurse Practitioner

## 2016-06-13 DIAGNOSIS — D6959 Other secondary thrombocytopenia: Secondary | ICD-10-CM | POA: Insufficient documentation

## 2016-06-13 DIAGNOSIS — T451X5A Adverse effect of antineoplastic and immunosuppressive drugs, initial encounter: Secondary | ICD-10-CM

## 2016-06-13 NOTE — Assessment & Plan Note (Signed)
Patient received cycle 10 of her gemcitabine chemotherapy regimen on 06/02/2016.  She also received Neulasta for growth factor support.  She is scheduled to meet with radiation oncology on 06/15/2016.  She is scheduled for labs, flush, visit, and chemotherapy again on 06/16/2016.

## 2016-06-13 NOTE — Assessment & Plan Note (Addendum)
Patient has a history of chronic pain; and takes Percocet on a fairly regular basis for her pain control.  She states that she ran out of her pain medication on Friday, 06/09/2016.  She was scheduled to be seen in the symptom management clinic that afternoon; but was unable to come to this appointment due to transportation issues.  She presented to the emergency department later that evening for pain control.  At that time it was noted that patient's platelet count was low at 19.  Patient has a history of chronic hematuria; and had noted increased hematuria most likely secondary to thrombocytopenia.  Patient also takes OxyContin 10 mg every 12 hours on regular basis; but states that she has plenty of this medication at home.  Also, patient states that she noticed some fullness to the right side of her neck at the site of her right upper chest Port-A-Cath.  She states that this was evaluated chills in the emergency department; and the emergency department physician looked at the site with a ultrasound.  She states that a Port-A-Cath has been functioning appropriately; and that there were no issues with her Port-A-Cath per evaluation of the emergency department physician.  Will continue to monitor closely.  Patient will be given any prescription refill for her Percocet today.

## 2016-06-13 NOTE — Assessment & Plan Note (Signed)
Patient last received her chemotherapy on 06/02/2016.  While in the emergency department with complaint of pain.  Was noted that patient's platelet count was 19.  Patient has history of chronic hematuria; but there was increased hematuria most likely secondary to the thrombocytopenia.  Platelet count today has increased from 19 up to 44.  Patient denies any dysuria or obvious hematuria this point.  Will continue to monitor closely.

## 2016-06-13 NOTE — Progress Notes (Signed)
SYMPTOM MANAGEMENT CLINIC    Chief Complaint: Pain  HPI:  Paula Navarro 65 y.o. female diagnosed with breast cancer; with metastasis to the bone, lung, and scalp.  Currently undergoing gemcitabine chemotherapy regimen.     Bilateral breast cancer ([place])   07/23/2013 Mammogram    Bilateral breast masses. 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 N1 M0 stage IIB ER 100%, PR 0%, HER-2       06/17/2014 -  Radiation Therapy    Adjuvant radiation therapy      09/14/2014 -  Anti-estrogen oral therapy    Anastrozole 1 mg daily      05/28/2015 Imaging    CT scans: Enlarging subpectoral masses 3.1 x 3.5 cm, posteriorly lower density mass 4.5 x 2.1 cm, several right-sided lung nodules right lower lobe 1.4 cm, 3 other right lung nodules, 1.7 cm right iliac bone lesion      06/21/2015 Procedure    Left subpectoral mass biopsy: Invasive high-grade ductal carcinoma ER 5%, PR 0%, HER-2 negative ratio 1.29      09/01/2015 Imaging   Left chest wall mass increased in size 7.2 x 5.1 cm, multiple subcutaneous nodules, increase in the lung nodules both in number as well as in the size of existing nodules      09/10/2015 -  Chemotherapy    Carboplatin, gemcitabine days 1 and 8 q 3 weeks, carboplatin discontinued for neuropathy (treatment break from 01/20/2016 to 03/24/2016); Added Carboplatin back 04/14/16 (for progression)      01/26/2016 Imaging    Marked improvement in size of lung nodules with many of the nodules resolved index right lower lobe nodule 1.9 x 1.8 cm is now 0.7 x 0.6 cm, reduction in the size of left breast mass 7.2 cm down to 2.6 cm, enlargement in the hypodense mass in the uterus      04/06/2016 Imaging    CT chest: Interval progression of pre-existing lung nodules new lung metastases (38m, 155m 2.9 cm, 1.6 cm), interval progression of disease in the left breast 4.2 cm (was 2.6 cm), additional nodules 2.3 cm and 3.2 cm; CT head: scalp mass 1.9 cm       Review of Systems  Constitutional: Positive for malaise/fatigue.  Musculoskeletal: Positive for back pain and myalgias.  All other systems reviewed and are negative.   Past Medical History:  Diagnosis Date  . Anemia   .  Anxiety   . Atrial septal defect 1996   Surgical repair in 1996  . Breast cancer ([place])   . Chest pain    Admitted to [facility] in 09/2011; refused stress test  . Chronic bronchitis   . Chronic pain   . COPD (chronic obstructive pulmonary disease) ([place])    on xray  . Palpitation    Tachycardia reported by monitor clerk during a symptomatic spell  . Radiation 06/30/14-08/17/14   Bilateral Breast  . Tobacco abuse    60 pack years; 1.5 packs per day  . Wears dentures    top    Past Surgical History:  Procedure Laterality Date  . ASD REPAIR, OSTIUM PRIMUM  1996   dr Roxy Horseman  . AXILLARY LYMPH NODE DISSECTION Bilateral 04/08/2014   Procedure:  BILATERAL AXILLARY LYMPH NODE DISSECTION;  Surgeon: Autumn Messing III, MD;  Location: [place];  Service: General;  Laterality: Bilateral;  . BREAST BIOPSY Bilateral   . BREAST LUMPECTOMY WITH RADIOACTIVE SEED LOCALIZATION Bilateral 04/08/2014   Procedure: BILATERAL  RADIOACTIVE SEED LOCALIZATION LUMPECTOMY ;  Surgeon: Autumn Messing III, MD;  Location: [facility];  Service: General;  Laterality: Bilateral;  . CESAREAN SECTION     x3  . CHOLECYSTECTOMY    . open heart surgery    . PORT A CATH REVISION  1/15   put in   . TUBAL LIGATION      has Chronic bronchitis; Tobacco abuse; Laboratory test; Palpitation; Chest pain; Atrial septal defect; Bilateral breast cancer ([place]); Neuropathy due to chemotherapeutic drug ([place]); Anxiety; Suspected herpes zoster left C5 distribution; Postherpetic neuralgia; Lymphedema; Vaginal bleeding; Chronic pain; Metastatic breast cancer ([place]); Encounter for chemotherapy management; Bone metastases ([place]); Lung metastases ([place]); Microcytic anemia; Insomnia; Port catheter in place; Encounter for central line care; Scalp lesion; and Chemotherapy-induced thrombocytopenia on her problem list.    is allergic to codeine and morphine and related.    Medication List       Accurate as of 06/12/16 11:59 PM. Always use your most recent med list.          calcium-vitamin D 500-400 MG-UNIT tablet Commonly known as:  OSCAL-500 Take 1 tablet by mouth 2 (two) times daily.   docusate sodium 100 MG capsule Commonly known as:  COLACE Take 1 capsule (100 mg total) by mouth every 12 (twelve) hours.   gabapentin 300 MG capsule Commonly known as:  NEURONTIN TAKE 2 CAPSULES (600 MG TOTAL) BY MOUTH 3 (THREE) TIMES DAILY.   HYDROcodone-acetaminophen 5-325 MG tablet Commonly known as:  NORCO/VICODIN Take 1 tablet by mouth every 4 (four) hours as needed.   LORazepam 1 MG tablet Commonly known as:  ATIVAN Take 1 tablet (1 mg total) by mouth at bedtime.   ondansetron 4 MG tablet Commonly known as:  ZOFRAN Take 1 tablet (4 mg total) by mouth  every 8 (eight) hours as needed for nausea or vomiting.   oxyCODONE 10 mg 12 hr tablet Commonly known as:  OXYCONTIN Take 1 tablet (10 mg total) by mouth every 12 (twelve) hours.   oxyCODONE-acetaminophen 5-325 MG tablet Commonly known as:  PERCOCET/ROXICET 1 tabs PO q6h prn pain   prochlorperazine 10 MG tablet Commonly known as:  COMPAZINE TAKE 1 TABLET EVERY 6 HOURS AS NEEDED NAUEA OR VOMITING   SUPER B COMPLEX PO Take 1 tablet by mouth daily.   traZODone 50 MG tablet Commonly known as:  DESYREL Take 1 tablet (50 mg total) by mouth at bedtime.  zolpidem 10 MG tablet Commonly known as:  AMBIEN Take 1 tablet (10 mg total) by mouth at bedtime as needed for sleep.        PHYSICAL EXAMINATION  Oncology Vitals 06/12/2016 06/09/2016  Height 163 cm -  Weight 62.687 kg -  Weight (lbs) 138 lbs 3 oz -  BMI (kg/m2) 23.72 kg/m2 -  Temp 97.7 -  Pulse 81 84  Resp 17 18  SpO2 100 97  BSA (m2) 1.68 m2 -   BP Readings from Last 2 Encounters:  06/12/16 134/80  06/09/16 104/57    Physical Exam  Constitutional: She is oriented to person, place, and time and well-developed, well-nourished, and in no distress.  HENT:  Head: Normocephalic and atraumatic.  Mouth/Throat: Oropharynx is clear and moist.  Right upper chest Port-A-Cath appears intact with no evidence of infection.  There is no surrounding edema to the right upper chest Port-A-Cath site.  Patient's right anterior neck area of the Port-A-Cath.  Does appear slightly more prominent-no evidence of tenderness, erythema, or other issues.  Eyes: Conjunctivae and EOM are normal. Pupils are equal, round, and reactive to light. Right eye exhibits no discharge. Left eye exhibits no discharge. No scleral icterus.  Neck: Normal range of motion. Neck supple. No JVD present. No tracheal deviation present. No thyromegaly present.  Cardiovascular: Normal rate, regular rhythm, normal heart sounds and intact distal pulses.   Pulmonary/Chest:  Effort normal and breath sounds normal. No respiratory distress. She has no wheezes. She has no rales. She exhibits no tenderness.  Abdominal: Soft. Bowel sounds are normal. She exhibits no distension and no mass. There is no tenderness. There is no rebound and no guarding.  Musculoskeletal: Normal range of motion. She exhibits no edema or tenderness.  Lymphadenopathy:    She has no cervical adenopathy.  Neurological: She is alert and oriented to person, place, and time. Gait normal.  Skin: Skin is warm and dry. No rash noted. No erythema. No pallor.  Psychiatric: Affect normal.  Nursing note and vitals reviewed.   LABORATORY DATA:. Appointment on 06/12/2016  Component Date Value Ref Range Status  . WBC 06/12/2016 23.2* 3.9 - 10.3 10e3/uL Final  . NEUT# 06/12/2016 16.7* 1.5 - 6.5 10e3/uL Final  . HGB 06/12/2016 9.5* 11.6 - 15.9 g/dL Final  . HCT 06/12/2016 29.3* 34.8 - 46.6 % Final  . Platelets 06/12/2016 44* 145 - 400 10e3/uL Final  . MCV 06/12/2016 73.8* 79.5 - 101.0 fL Final  . MCH 06/12/2016 23.9* 25.1 - 34.0 pg Final  . MCHC 06/12/2016 32.4  31.5 - 36.0 g/dL Final  . RBC 06/12/2016 3.97  3.70 - 5.45 10e6/uL Final  . RDW 06/12/2016 18.3* 11.2 - 14.5 % Final  . lymph# 06/12/2016 3.5* 0.9 - 3.3 10e3/uL Final  . MONO# 06/12/2016 2.7* 0.1 - 0.9 10e3/uL Final  . Eosinophils Absolute 06/12/2016 0.1  0.0 - 0.5 10e3/uL Final  . Basophils Absolute 06/12/2016 0.2* 0.0 - 0.1 10e3/uL Final  . NEUT% 06/12/2016 72.0  38.4 - 76.8 % Final  . LYMPH% 06/12/2016 14.9  14.0 - 49.7 % Final  . MONO% 06/12/2016 11.8  0.0 - 14.0 % Final  . EOS% 06/12/2016 0.6  0.0 - 7.0 % Final  . BASO% 06/12/2016 0.7  0.0 - 2.0 % Final  . nRBC 06/12/2016 2* 0 - 0 % Final  . Sodium 06/12/2016 141  136 - 145 mEq/L Final  . Potassium 06/12/2016 3.7  3.5 - 5.1 mEq/L Final  . Chloride 06/12/2016 102  98 - 109 mEq/L Final  . CO2 06/12/2016 28  22 - 29 mEq/L Final  . Glucose 06/12/2016 173* 70 - 140 mg/dl Final  . BUN  06/12/2016 8.3  7.0 - 26.0 mg/dL Final  . Creatinine 06/12/2016 0.9  0.6 - 1.1 mg/dL Final  . Total Bilirubin 06/12/2016 0.42  0.20 - 1.20 mg/dL Final  . Alkaline Phosphatase 06/12/2016 154* 40 - 150 U/L Final  . AST 06/12/2016 60* 5 - 34 U/L Final  . ALT 06/12/2016 46  0 - 55 U/L Final  . Total Protein 06/12/2016 7.0  6.4 - 8.3 g/dL Final  . Albumin 06/12/2016 3.3* 3.5 - 5.0 g/dL Final  . Calcium 06/12/2016 10.0  8.4 - 10.4 mg/dL Final  . Anion Gap 06/12/2016 11  3 - 11 mEq/L Final  . EGFR 06/12/2016 81* >90 ml/min/1.73 m2 Final    RADIOGRAPHIC STUDIES: No results found.  ASSESSMENT/PLAN:    Chronic pain Patient has a history of chronic pain; and takes Percocet on a fairly regular basis for her pain control.  She states that she ran out of her pain medication on Friday, 06/09/2016.  She was scheduled to be seen in the symptom management clinic that afternoon; but was unable to come to this appointment due to transportation issues.  She presented to the emergency department later that evening for pain control.  At that time it was noted that patient's platelet count was low at 19.  Patient has a history of chronic hematuria; and had noted increased hematuria most likely secondary to thrombocytopenia.  Patient also takes OxyContin 10 mg every 12 hours on regular basis; but states that she has plenty of this medication at home.  Also, patient states that she noticed some fullness to the right side of her neck at the site of her right upper chest Port-A-Cath.  She states that this was evaluated chills in the emergency department; and the emergency department physician looked at the site with a ultrasound.  She states that a Port-A-Cath has been functioning appropriately; and that there were no issues with her Port-A-Cath per evaluation of the emergency department physician.  Will continue to monitor closely.  Patient will be given any prescription refill for her Percocet  today.  Chemotherapy-induced thrombocytopenia Patient last received her chemotherapy on 06/02/2016.  While in the emergency department with complaint of pain.  Was noted that patient's platelet count was 19.  Patient has history of chronic hematuria; but there was increased hematuria most likely secondary to the thrombocytopenia.  Platelet count today has increased from 19 up to 44.  Patient denies any dysuria or obvious hematuria this point.  Will continue to monitor closely.  Bilateral breast cancer [facility]) Patient received cycle 10 of her gemcitabine chemotherapy regimen on 06/02/2016.  She also received Neulasta for growth factor support.  She is scheduled to meet with radiation oncology on 06/15/2016.  She is scheduled for labs, flush, visit, and chemotherapy again on 06/16/2016.   Patient stated understanding of all instructions; and was in agreement with this plan of care. The patient knows to call the clinic with any problems, questions or concerns.   Total time spent with patient was 25 minutes;  with greater than 75 percent of that time spent in face to face counseling regarding patient's symptoms,  and coordination of care and follow up.  Disclaimer:This dictation was prepared with Dragon/digital dictation along with [employer]. Any transcriptional errors that result from this process are unintentional.  Drue Second, NP  06/13/2016   

## 2016-06-14 ENCOUNTER — Telehealth: Payer: Self-pay | Admitting: Hematology and Oncology

## 2016-06-14 ENCOUNTER — Telehealth: Payer: Self-pay | Admitting: *Deleted

## 2016-06-14 NOTE — Telephone Encounter (Signed)
TCT patient to follow up on [facility] visit on 06/12/16. Pt states she is feeling ok. Her one question was about an ultrasound of neck and whether she is supposed to get one or not.  Will ask Selena Lesser, NP about that.   She is aware of upcoming appts with Dr. Lisbeth Renshaw in [place]. Onc on 06/15/16 and Dr. Lindi Adie on 06/16/16.

## 2016-06-14 NOTE — Progress Notes (Signed)
Reconsult Metastatic breast cancer Bi/Lateral now supraclavicular  Tumor  Region increasing in size,   End of Treatment Note Dr. Pablo Ledger ,MD Note Diagnosis:   T2N1 Left breast cancer (ypT2N0), T2N0 Right breast cancer (ypT1cN1)  Indication for treatment:  Curative    Radiation treatment dates:   06/30/2014-08/17/2014  Site/dose:   Left breast/ 45 Gy at 1.8 Gy per fraction x 25 fractions.  Left supraclavicular fossa/ 45 Gy at 1.8 Gy per fraction x 25 fractions Left breast boost/ 16 Gy at 2 Gy per fraction x 8 fractions Right breast / 45 Gray @ 1.8 [employer] per fraction x 25 fractions Right supraclavicular fossa / 45 Gy '@1'$ .8 Gy per fraction x 25 fractions Right breast boost / 16 Gray at [employer] per fraction x 8 fractions  Beams/energy: Opposed Tangents / 6 MV photons RAO / 10 MV photons Enface electrons / 12 MeV electrons Opposed tangents with reduced fields / 6 MV photons LAO/ 10 MV photons 3 field / 6 and 10 MV photons  Has lymphedema left arm,hand, tightness,  Swelling at axilla and supraclavicular region,  Pain  Soreness mostly,   Patient has appt with Dr. Dollene Cleveland at [facility]  On 06/28/16 @ 7:45am  06/02/16 : cycle 10 of gemcitabine,   Given , next chemotherapy infusion scheduled 06/16/16  BP 113/79 (BP Location: Right Arm, Patient Position: Sitting, Cuff Size: Normal)   Pulse 86   Temp 98.6 F (37 C) (Oral)   Resp 20   Ht '5\' 4"'$  (1.626 m)   Wt 138 lb 6.4 oz (62.8 kg)   LMP 05/20/2011   SpO2 100% Comment: room air  BMI 23.76 kg/m   Wt Readings from Last 3 Encounters:  06/15/16 138 lb 6.4 oz (62.8 kg)  06/12/16 138 lb 3.2 oz (62.7 kg)  06/08/16 137 lb (62.1 kg)

## 2016-06-14 NOTE — Telephone Encounter (Signed)
Pt appt with Dr. Dollene Cleveland @ [facility] is 06/28/16'@7'$ :56. Pt is aware. Mina Marble will go into [facility] for records. I will fax the path reports.

## 2016-06-15 ENCOUNTER — Ambulatory Visit
Admission: RE | Admit: 2016-06-15 | Discharge: 2016-06-15 | Disposition: A | Discharge status: Home / Self Care | Payer: Medicare Other | Source: Ambulatory Visit | Attending: Radiation Oncology | Admitting: Radiation Oncology

## 2016-06-15 ENCOUNTER — Encounter: Payer: Self-pay | Admitting: Radiation Oncology

## 2016-06-15 VITALS — BP 113/79 | HR 86 | Temp 98.6°F | Resp 20 | Ht 64.0 in | Wt 138.4 lb

## 2016-06-15 DIAGNOSIS — C50012 Malignant neoplasm of nipple and areola, left female breast: Secondary | ICD-10-CM | POA: Insufficient documentation

## 2016-06-15 DIAGNOSIS — Z9221 Personal history of antineoplastic chemotherapy: Secondary | ICD-10-CM | POA: Diagnosis not present

## 2016-06-15 DIAGNOSIS — Z923 Personal history of irradiation: Secondary | ICD-10-CM | POA: Diagnosis not present

## 2016-06-15 DIAGNOSIS — Z170 Estrogen receptor positive status [ER+]: Secondary | ICD-10-CM | POA: Insufficient documentation

## 2016-06-15 DIAGNOSIS — Z87891 Personal history of nicotine dependence: Secondary | ICD-10-CM | POA: Diagnosis not present

## 2016-06-15 DIAGNOSIS — Z885 Allergy status to narcotic agent status: Secondary | ICD-10-CM | POA: Diagnosis not present

## 2016-06-15 DIAGNOSIS — Z79899 Other long term (current) drug therapy: Secondary | ICD-10-CM | POA: Insufficient documentation

## 2016-06-15 DIAGNOSIS — G8929 Other chronic pain: Secondary | ICD-10-CM | POA: Insufficient documentation

## 2016-06-15 DIAGNOSIS — C50011 Malignant neoplasm of nipple and areola, right female breast: Secondary | ICD-10-CM | POA: Insufficient documentation

## 2016-06-15 DIAGNOSIS — F419 Anxiety disorder, unspecified: Secondary | ICD-10-CM | POA: Insufficient documentation

## 2016-06-15 DIAGNOSIS — C50919 Malignant neoplasm of unspecified site of unspecified female breast: Secondary | ICD-10-CM

## 2016-06-15 DIAGNOSIS — J449 Chronic obstructive pulmonary disease, unspecified: Secondary | ICD-10-CM | POA: Diagnosis not present

## 2016-06-15 DIAGNOSIS — C770 Secondary and unspecified malignant neoplasm of lymph nodes of head, face and neck: Secondary | ICD-10-CM | POA: Insufficient documentation

## 2016-06-15 NOTE — Assessment & Plan Note (Signed)
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 N1 M0 stage IIB Metastatic diseasediagnosed 05/28/15 (subpectoral masses 3.5 cm and 4.5 cm. Four lung nodules the largest 1.4 cm in the right lung 1.7 cm sclerotic right iliac bone lesion) Left subpectoral mass biopsy 06/21/2015: Invasive high-grade ductal carcinoma ER 5%, PR 0%, HER-2 negative ratio 1.29 Prior treatment:  1. Xeloda 1500 mg by mouth twice a day started 07/07/2015 stopped 09/03/2015 (stopped for progression) 2. Carboplatin day 1 and gemcitabine days 1 and 8 every 3 weeks started 09/10/2015; completed cycle 5 01/20/2016 (carboplatin discontinued for neuropathy) 3. Complete treatment break for 2 months ------------------------------------------------------------------------------------------------------------------------------------------ Current treatment: Carboplatin and gemcitabine (carboplatin added because of progression of disease 04/14/2016), today is cycle 3 day 1 q 3 weeks  CT chest 04/06/2016: Interval progression of pre-existing lung nodules new lung metastases (85m, 160m 2.9 cm, 1.6 cm), interval progression of disease in the left breast 4.2 cm (was 2.6 cm), additional nodules 2.3 cm and 3.2 cm; CT head: scalp mass 1.9 cm  Chemotherapy toxicities: 1. Fatigue 2. Anemia due to chemotherapy: hgb improved mildly to 9.0 3. Severe leukocytosis with neutrophils markedly increased due to Neulasta: Neulasta discontinued , will resume Neulasta because of neutropenia 4. Grade 2 neuropathy  Pain issues: Oxycontin for long acting pain medication. Pain well controlled Insomnia:  D/Ced Ativan and started restoril. Advised to try melatonin 47m82ms well Lymphedema: physical therapy. Continue to wear compression sleeves daily. Bone mets: Xgeva q 6 weeks with calcium and Vit D  Patient does make that the chest wall tumor is increasing in size. I do not see any noticeable change. Because of this I Would like to Perform CT of the Chest and Evaluate Her before Her Next Treatment. [facility].  Return to clinic in 3 weeks for cycle 4 day 1. We would like to see the scans prior to that cycle.

## 2016-06-15 NOTE — Progress Notes (Signed)
Please see the Nurse Progress Note in the MD Initial Consult Encounter for this patient. 

## 2016-06-16 ENCOUNTER — Ambulatory Visit: Payer: Medicare Other

## 2016-06-16 ENCOUNTER — Encounter: Payer: Self-pay | Admitting: Hematology and Oncology

## 2016-06-16 ENCOUNTER — Ambulatory Visit (HOSPITAL_BASED_OUTPATIENT_CLINIC_OR_DEPARTMENT_OTHER): Payer: Medicare Other

## 2016-06-16 ENCOUNTER — Ambulatory Visit (HOSPITAL_BASED_OUTPATIENT_CLINIC_OR_DEPARTMENT_OTHER): Payer: Medicare Other | Admitting: Hematology and Oncology

## 2016-06-16 ENCOUNTER — Other Ambulatory Visit (HOSPITAL_BASED_OUTPATIENT_CLINIC_OR_DEPARTMENT_OTHER): Payer: Medicare Other

## 2016-06-16 VITALS — BP 119/72 | HR 81 | Temp 97.8°F | Resp 18 | Ht 64.0 in | Wt 139.1 lb

## 2016-06-16 DIAGNOSIS — C50011 Malignant neoplasm of nipple and areola, right female breast: Secondary | ICD-10-CM

## 2016-06-16 DIAGNOSIS — C7951 Secondary malignant neoplasm of bone: Secondary | ICD-10-CM

## 2016-06-16 DIAGNOSIS — C7801 Secondary malignant neoplasm of right lung: Secondary | ICD-10-CM | POA: Diagnosis not present

## 2016-06-16 DIAGNOSIS — C50911 Malignant neoplasm of unspecified site of right female breast: Secondary | ICD-10-CM | POA: Diagnosis not present

## 2016-06-16 DIAGNOSIS — G620 Drug-induced polyneuropathy: Secondary | ICD-10-CM

## 2016-06-16 DIAGNOSIS — D6481 Anemia due to antineoplastic chemotherapy: Secondary | ICD-10-CM | POA: Diagnosis not present

## 2016-06-16 DIAGNOSIS — C50912 Malignant neoplasm of unspecified site of left female breast: Secondary | ICD-10-CM | POA: Diagnosis not present

## 2016-06-16 DIAGNOSIS — Z170 Estrogen receptor positive status [ER+]: Secondary | ICD-10-CM

## 2016-06-16 DIAGNOSIS — C50012 Malignant neoplasm of nipple and areola, left female breast: Secondary | ICD-10-CM

## 2016-06-16 DIAGNOSIS — Z5111 Encounter for antineoplastic chemotherapy: Secondary | ICD-10-CM

## 2016-06-16 DIAGNOSIS — T451X5A Adverse effect of antineoplastic and immunosuppressive drugs, initial encounter: Secondary | ICD-10-CM

## 2016-06-16 DIAGNOSIS — C50919 Malignant neoplasm of unspecified site of unspecified female breast: Secondary | ICD-10-CM

## 2016-06-16 DIAGNOSIS — D6959 Other secondary thrombocytopenia: Secondary | ICD-10-CM

## 2016-06-16 DIAGNOSIS — R319 Hematuria, unspecified: Secondary | ICD-10-CM

## 2016-06-16 LAB — CBC WITH DIFFERENTIAL/PLATELET
BASO%: 0.5 % (ref 0.0–2.0)
BASOS ABS: 0.2 10*3/uL — AB (ref 0.0–0.1)
EOS ABS: 0.2 10*3/uL (ref 0.0–0.5)
EOS%: 0.7 % (ref 0.0–7.0)
HCT: 29.4 % — ABNORMAL LOW (ref 34.8–46.6)
HEMOGLOBIN: 9.4 g/dL — AB (ref 11.6–15.9)
LYMPH#: 3.4 10*3/uL — AB (ref 0.9–3.3)
LYMPH%: 11.3 % — ABNORMAL LOW (ref 14.0–49.7)
MCH: 23.7 pg — ABNORMAL LOW (ref 25.1–34.0)
MCHC: 32 g/dL (ref 31.5–36.0)
MCV: 74.2 fL — AB (ref 79.5–101.0)
MONO#: 2.7 10*3/uL — AB (ref 0.1–0.9)
MONO%: 9.1 % (ref 0.0–14.0)
NEUT%: 78.4 % — ABNORMAL HIGH (ref 38.4–76.8)
NEUTROS ABS: 23.5 10*3/uL — AB (ref 1.5–6.5)
NRBC: 1 % — AB (ref 0–0)
PLATELETS: 310 10*3/uL (ref 145–400)
RBC: 3.96 10*6/uL (ref 3.70–5.45)
RDW: 20 % — AB (ref 11.2–14.5)
WBC: 30 10*3/uL — ABNORMAL HIGH (ref 3.9–10.3)

## 2016-06-16 LAB — COMPREHENSIVE METABOLIC PANEL
ALBUMIN: 3.2 g/dL — AB (ref 3.5–5.0)
ALK PHOS: 148 U/L (ref 40–150)
ALT: 31 U/L (ref 0–55)
ANION GAP: 11 meq/L (ref 3–11)
AST: 39 U/L — ABNORMAL HIGH (ref 5–34)
BILIRUBIN TOTAL: 0.39 mg/dL (ref 0.20–1.20)
BUN: 6.1 mg/dL — ABNORMAL LOW (ref 7.0–26.0)
CO2: 25 mEq/L (ref 22–29)
CREATININE: 0.8 mg/dL (ref 0.6–1.1)
Calcium: 9.6 mg/dL (ref 8.4–10.4)
Chloride: 103 mEq/L (ref 98–109)
GLUCOSE: 163 mg/dL — AB (ref 70–140)
Potassium: 3.9 mEq/L (ref 3.5–5.1)
Sodium: 139 mEq/L (ref 136–145)
TOTAL PROTEIN: 7 g/dL (ref 6.4–8.3)

## 2016-06-16 MED ORDER — PALONOSETRON HCL INJECTION 0.25 MG/5ML
0.2500 mg | Freq: Once | INTRAVENOUS | Status: AC
  Administered 2016-06-16: 0.25 mg via INTRAVENOUS

## 2016-06-16 MED ORDER — HEPARIN SOD (PORK) LOCK FLUSH 100 UNIT/ML IV SOLN
500.0000 [IU] | Freq: Once | INTRAVENOUS | Status: AC | PRN
  Administered 2016-06-16: 500 [IU]
  Filled 2016-06-16: qty 5

## 2016-06-16 MED ORDER — DENOSUMAB 120 MG/1.7ML ~~LOC~~ SOLN
120.0000 mg | Freq: Once | SUBCUTANEOUS | Status: AC
  Administered 2016-06-16: 120 mg via SUBCUTANEOUS
  Filled 2016-06-16: qty 1.7

## 2016-06-16 MED ORDER — PALONOSETRON HCL INJECTION 0.25 MG/5ML
INTRAVENOUS | Status: AC
  Filled 2016-06-16: qty 5

## 2016-06-16 MED ORDER — SODIUM CHLORIDE 0.9% FLUSH
10.0000 mL | INTRAVENOUS | Status: DC | PRN
  Administered 2016-06-16: 10 mL
  Filled 2016-06-16: qty 10

## 2016-06-16 MED ORDER — SODIUM CHLORIDE 0.9 % IV SOLN
Freq: Once | INTRAVENOUS | Status: AC
  Administered 2016-06-16: 11:00:00 via INTRAVENOUS

## 2016-06-16 MED ORDER — NYSTATIN 100000 UNIT/ML MT SUSP: 5.0000 mL | Freq: Four times a day (QID) | OROMUCOSAL | 0 refills | Status: DC

## 2016-06-16 MED ORDER — DEXAMETHASONE SODIUM PHOSPHATE 10 MG/ML IJ SOLN
10.0000 mg | Freq: Once | INTRAMUSCULAR | Status: AC
  Administered 2016-06-16: 10 mg via INTRAVENOUS

## 2016-06-16 MED ORDER — SODIUM CHLORIDE 0.9 % IV SOLN
482.0000 mg | Freq: Once | INTRAVENOUS | Status: AC
  Administered 2016-06-16: 480 mg via INTRAVENOUS
  Filled 2016-06-16: qty 48

## 2016-06-16 MED ORDER — SODIUM CHLORIDE 0.9 % IV SOLN
550.0000 mg/m2 | Freq: Once | INTRAVENOUS | Status: AC
  Administered 2016-06-16: 912 mg via INTRAVENOUS
  Filled 2016-06-16: qty 23.99

## 2016-06-16 MED ORDER — CARBOPLATIN CHEMO INTRADERMAL TEST DOSE 100MCG/0.02ML
100.0000 ug | Freq: Once | INTRADERMAL | Status: AC
  Administered 2016-06-16: 100 ug via INTRADERMAL
  Filled 2016-06-16: qty 0.01

## 2016-06-16 MED ORDER — DEXAMETHASONE SODIUM PHOSPHATE 10 MG/ML IJ SOLN
INTRAMUSCULAR | Status: AC
  Filled 2016-06-16: qty 1

## 2016-06-16 MED ORDER — SODIUM CHLORIDE 0.9% FLUSH
10.0000 mL | INTRAVENOUS | Status: DC | PRN
Start: 2016-06-16 — End: 2016-06-16
  Administered 2016-06-16: 10 mL via INTRAVENOUS
  Filled 2016-06-16: qty 10

## 2016-06-16 MED FILL — NYSTATIN 100,000 UNITS/ML S: 100000 | 3 days supply | Qty: 60 | Fill #0

## 2016-06-16 NOTE — Progress Notes (Signed)
Patient Care Team: [name], MD as PCP - General (Family Medicine) [name], MD (Cardiology)  DIAGNOSIS:  Encounter Diagnoses  Name Primary?  . 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 Neuropathy due to chemotherapeutic drug ([place])   . Malignant neoplasm metastatic to right lung ([place])   . Bone metastases ([place])   . Chemotherapy-induced thrombocytopenia     SUMMARY OF ONCOLOGIC HISTORY:   Bilateral breast cancer ([place])   07/23/2013 Mammogram    Bilateral breast masses. 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 N1 M0 stage IIB ER 100%, PR 0%, HER-2       06/17/2014 -  Radiation Therapy    Adjuvant radiation therapy      09/14/2014 -  Anti-estrogen oral therapy    Anastrozole 1 mg daily      05/28/2015 Imaging    CT scans: Enlarging subpectoral masses 3.1 x 3.5 cm, posteriorly lower density mass 4.5 x 2.1 cm, several right-sided lung nodules right lower  lobe 1.4 cm, 3 other right lung nodules, 1.7 cm right iliac bone lesion      06/21/2015 Procedure    Left subpectoral mass biopsy: Invasive high-grade ductal carcinoma ER 5%, PR 0%, HER-2 negative ratio 1.29      09/01/2015 Imaging    Left chest wall mass increased in size 7.2 x 5.1 cm, multiple subcutaneous nodules, increase in the lung nodules both in number as well as in the size of existing nodules      09/10/2015 -  Chemotherapy    Carboplatin, gemcitabine days 1 and 8 q 3 weeks, carboplatin discontinued for neuropathy (treatment break from 01/20/2016 to 03/24/2016); Added Carboplatin back 04/14/16 (for progression)      01/26/2016 Imaging    Marked improvement in size of lung nodules with many of the nodules resolved index right lower lobe nodule 1.9 x 1.8 cm is now 0.7 x 0.6 cm, reduction in the size of left breast mass 7.2 cm down to 2.6 cm, enlargement in the hypodense mass in the uterus      04/06/2016 Imaging    CT chest: Interval progression of pre-existing lung nodules new lung metastases (15m, 152m 2.9 cm, 1.6 cm), interval progression of disease in the left breast 4.2 cm (was 2.6 cm), additional nodules 2.3 cm and 3.2 cm; CT head: scalp mass 1.9 cm  CHIEF COMPLIANT: Metastatic breast cancer on carboplatin and gemcitabine  INTERVAL HISTORY: Paula Navarro is a 65 year old with above-mentioned history metastatic breast cancer on carboplatin and gemcitabine. She was recently in the emergency room with low back pain and was found to have thrombocytopenia. She had hematuria at that time. She had to get platelet transfusions with improvement of the hematuria. She tells me that the left chest wall mass is actually now getting smaller. Last time she told me that was getting bigger. It looks like this may be related to swelling related to her exertional activities. Patient complained that there was a physical in sound on the chest wall near the port along with some swelling of the  right side of the neck but they have all subsided.  REVIEW OF SYSTEMS:   Constitutional: Denies fevers, chills or abnormal weight loss Eyes: Denies blurriness of vision Ears, nose, mouth, throat, and face: Denies mucositis or sore throat Respiratory: Denies cough, dyspnea or wheezes Cardiovascular: Denies palpitation, chest discomfort Gastrointestinal:  Denies nausea, heartburn or change in bowel habits Skin: Denies abnormal skin rashes Lymphatics: Denies new lymphadenopathy or easy bruising Neurological:Denies numbness, tingling or new weaknesses Behavioral/Psych: Mood is stable, no new changes  Extremities: No lower extremity edema  All other systems were reviewed with the patient and are negative.  I have reviewed the past medical history, past surgical history, social history and family history with the patient and they are unchanged from previous note.  ALLERGIES:  is allergic to codeine and morphine and related.  MEDICATIONS:  Current Outpatient Prescriptions  Medication Sig Dispense Refill  . B Complex-C (SUPER B COMPLEX PO) Take 1 tablet by mouth daily.    . calcium-vitamin D (OSCAL-500) 500-400 MG-UNIT tablet Take 1 tablet by mouth 2 (two) times daily. 60 tablet 3  . docusate sodium (COLACE) 100 MG capsule Take 1 capsule (100 mg total) by mouth every 12 (twelve) hours. 60 capsule 0  . gabapentin (NEURONTIN) 300 MG capsule TAKE 2 CAPSULES (600 MG TOTAL) BY MOUTH 3 (THREE) TIMES DAILY. 180 capsule 2  . HYDROcodone-acetaminophen (NORCO/VICODIN) 5-325 MG tablet Take 1 tablet by mouth every 4 (four) hours as needed. (Patient not taking: Reported on 06/15/2016) 5 tablet 0  . LORazepam (ATIVAN) 1 MG tablet Take 1 tablet (1 mg total) by mouth at bedtime. 30 tablet 0  . ondansetron (ZOFRAN) 4 MG tablet Take 1 tablet (4 mg total) by mouth every 8 (eight) hours as needed for nausea or vomiting. 30 tablet 0  . oxyCODONE (OXYCONTIN) 10 mg 12 hr tablet Take 1 tablet (10 mg total) by mouth  every 12 (twelve) hours. (Patient not taking: Reported on 06/15/2016) 60 tablet 0  . oxyCODONE-acetaminophen (PERCOCET/ROXICET) 5-325 MG tablet 1 tabs PO q6h prn pain 60 tablet 0  . prochlorperazine (COMPAZINE) 10 MG tablet TAKE 1 TABLET EVERY 6 HOURS AS NEEDED NAUEA OR VOMITING 30 tablet 1  . traZODone (DESYREL) 50 MG tablet Take 1 tablet (50 mg total) by mouth at bedtime. 7 tablet 0  . zolpidem (AMBIEN) 10 MG tablet Take 1 tablet (10 mg total) by mouth at bedtime as needed for sleep. 30 tablet 2   No current facility-administered medications for this visit.    Facility-Administered Medications Ordered in Other Visits  Medication Dose Route Frequency Provider Last Rate Last Dose  . sodium chloride flush (NS) 0.9 % injection 10 mL  10 mL Intracatheter PRN Nicholas Lose, MD   10 mL at 03/31/16 1352    PHYSICAL  EXAMINATION: ECOG PERFORMANCE STATUS: 1 - Symptomatic but completely ambulatory  Vitals:   06/16/16 0918  BP: 119/72  Pulse: 81  Resp: 18  Temp: 97.8 F (36.6 C)   Filed Weights   06/16/16 0918  Weight: 139 lb 1.6 oz (63.1 kg)    GENERAL:alert, no distress and comfortable SKIN: skin color, texture, turgor are normal, no rashes or significant lesions EYES: normal, Conjunctiva are pink and non-injected, sclera clear OROPHARYNX:Mouth feels sore  NECK: supple, thyroid normal size, non-tender, without nodularity LYMPH:  no palpable lymphadenopathy in the cervical, axillary or inguinal LUNGS: clear to auscultation and percussion with normal breathing effort HEART: regular rate & rhythm and no murmurs and no lower extremity edema ABDOMEN:abdomen soft, non-tender and normal bowel sounds MUSCULOSKELETAL:no cyanosis of digits and no clubbing  NEURO: alert & oriented x 3 with fluent speech, no focal motor/sensory deficits EXTREMITIES: No lower extremity edema  LABORATORY DATA:  I have reviewed the data as listed   Chemistry      Component Value Date/Time   NA 141 06/12/2016  1119   K 3.7 06/12/2016 1119   CL 102 06/09/2016 0110   CO2 28 06/12/2016 1119   BUN 8.3 06/12/2016 1119   CREATININE 0.9 06/12/2016 1119      Component Value Date/Time   CALCIUM 10.0 06/12/2016 1119   ALKPHOS 154 (H) 06/12/2016 1119   AST 60 (H) 06/12/2016 1119   ALT 46 06/12/2016 1119   BILITOT 0.42 06/12/2016 1119       Lab Results  Component Value Date   WBC 30.0 (H) 06/16/2016   HGB 9.4 (L) 06/16/2016   HCT 29.4 (L) 06/16/2016   MCV 74.2 (L) 06/16/2016   PLT 310 06/16/2016   NEUTROABS 23.5 (H) 06/16/2016     ASSESSMENT & PLAN:  Bilateral breast cancer ([place]) Treated with neoadjuvant chemotherapy followed by surgery 04/08/2014, radiation treatment completed 07/17/2014, antiestrogen therapy with anastrozole 09/14/2014 Left breastT2, N0, M0, IDC grade 3; 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 N1 M0 stage IIB Metastatic diseasediagnosed 05/28/15 (subpectoral masses 3.5 cm and 4.5 cm. Four lung nodules the largest 1.4 cm in the right lung 1.7 cm sclerotic right iliac bone lesion) Left subpectoral mass biopsy 06/21/2015: Invasive high-grade ductal carcinoma ER 5%, PR 0%, HER-2 negative ratio 1.29 Prior treatment:  1. Xeloda 1500 mg by mouth twice a day started 07/07/2015 stopped 09/03/2015 (stopped for progression) 2. Carboplatin day 1 and gemcitabine days 1 and 8 every 3 weeks started 09/10/2015; completed cycle 5 01/20/2016 (carboplatin discontinued for neuropathy) 3. Complete treatment break for 2 months ------------------------------------------------------------------------------------------------------------------------------------------ Current treatment: Carboplatin and gemcitabine (carboplatin added because of progression of disease 04/14/2016)  CT chest 04/06/2016: Interval progression of pre-existing  lung nodules new lung metastases (12m, 156m 2.9 cm, 1.6 cm), interval progression of disease in the left breast 4.2 cm (was 2.6 cm), additional nodules 2.3 cm and 3.2 cm; CT head: scalp mass 1.9 cm  Chemotherapy toxicities: 1. Fatigue 2. Anemia due to chemotherapy: hgb improved mildly to 9.0 3. Severe leukocytosis with neutrophils markedly increased due to Neulasta: Neulasta discontinued , will resume Neulasta because of neutropenia 4. Grade 2 neuropathy  Pain issues: Oxycontin for long acting pain medication. Pain well controlled Insomnia: D/Ced Ativan and started restoril. Advised to try melatonin 63m61ms well Lymphedema: physical therapy. Continue to wear  compression sleeves daily. Bone mets: Xgeva q 6 weeks with calcium and Vit D Mouth sore: Renewed prescription for nystatin  Patient previously stated that the left chest wall tumor was getting bigger. But today she tells me is getting smaller. She saw Dr. Lisbeth Renshaw who is planning to do some palliative radiation. She stated that there was a fusing sound around the port it lasted for a few hours. I am not sure what that is. She no longer has any further problems from it.  Hematuria: Recently she was in the emergency department with low back pain at that time she had a platelet count of 19 and hematuria. She was given platelets which led to remarkable improvement in the platelet count. She does not have hematuria any further.  Patient wishes to get a second opinion at [facility]. Patient had previously seen Dr. Tressie Stalker  Return to clinic in 3 weeks for review of the CT chest and follow-up. We would like to see the scans prior to that cycle.  No orders of the defined types were placed in this encounter.  The patient has a good understanding of the overall plan. she agrees with it. she will call with any problems that may develop before the next visit here.   Rulon Eisenmenger, MD 06/16/16

## 2016-06-16 NOTE — Patient Instructions (Signed)
Cancer Center Discharge Instructions for Patients Receiving Chemotherapy  Today you received the following chemotherapy agents: Gemzar and Carboplatin   To help prevent nausea and vomiting after your treatment, we encourage you to take your nausea medication as directed.    If you develop nausea and vomiting that is not controlled by your nausea medication, call the clinic.   BELOW ARE SYMPTOMS THAT SHOULD BE REPORTED IMMEDIATELY:  *FEVER GREATER THAN 100.5 F  *CHILLS WITH OR WITHOUT FEVER  NAUSEA AND VOMITING THAT IS NOT CONTROLLED WITH YOUR NAUSEA MEDICATION  *UNUSUAL SHORTNESS OF BREATH  *UNUSUAL BRUISING OR BLEEDING  TENDERNESS IN MOUTH AND THROAT WITH OR WITHOUT PRESENCE OF ULCERS  *URINARY PROBLEMS  *BOWEL PROBLEMS  UNUSUAL RASH Items with * indicate a potential emergency and should be followed up as soon as possible.  Feel free to call the clinic you have any questions or concerns. The clinic phone number is [phone].  Please show the CHEMO ALERT CARD at check-in to the Emergency Department and triage nurse.   

## 2016-06-16 NOTE — Patient Instructions (Signed)

## 2016-06-18 DIAGNOSIS — C770 Secondary and unspecified malignant neoplasm of lymph nodes of head, face and neck: Secondary | ICD-10-CM | POA: Insufficient documentation

## 2016-06-18 NOTE — Progress Notes (Signed)
Radiation Oncology         [phone] ________________________________  Re-evaluation   Name: Paula Navarro MRN: [id number]  Date: 06/15/2016  DOB: 1951/07/22  ZY:[id number],WFUXN G, MD  Nicholas Lose, MD     REFERRING PHYSICIAN: Nicholas Lose, MD   DIAGNOSIS: The primary encounter diagnosis was Metastatic breast cancer ([place]). 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   HISTORY OF PRESENT ILLNESS::Paula Navarro is a 65 y.o. female who is seen for an initial consultation visit regarding the patient's diagnosis of bilateral breast cancer.  The patient has been treated definitively in our clinic previously and received adjuvant radiation treatment from December 2015 through January 2016 after undergoing a left sided lumpectomy. This treatment consisted of 45 gray to the left breast and left supraclavicular fossa as well as a 16 gray boost to the lumpectomy cavity on the left. She also received the same fractionation scheme on the right after undergoing a lumpectomy on this side as well. Unfortunately the patient was found to have a left chest wall mass in February 2017 with multiple subcutaneous nodules. The patient has proceeded with chemotherapy through medical oncology here at the [facility]. The patient's recent imaging included a CT scan of the chest on 04/06/2016. There is interval progression of some pre-existing pulmonary nodules as well as interval progression of disease within the left supra-auricular region/left upper chest.  The patient recently was on a break from chemotherapy but has restarted systemic treatment. Given the progression in the left super clavicular region, I have been asked to see the patient today for consideration of additional radiation treatment to this region.   PREVIOUS RADIATION THERAPY: Yes as above the patient  received bilateral radiation treatment in our clinic   PAST MEDICAL HISTORY:  has a past medical history of Anemia; Anxiety; Atrial septal defect (1996); Breast cancer ([place]); Chest pain; Chronic bronchitis; Chronic pain; COPD (chronic obstructive pulmonary disease) ([place]); Palpitation; Radiation (06/30/14-08/17/14); Tobacco abuse; and Wears dentures.     PAST SURGICAL HISTORY: Past Surgical History:  Procedure Laterality Date  . ASD REPAIR, OSTIUM PRIMUM  1996   dr Roxy Horseman  . AXILLARY LYMPH NODE DISSECTION Bilateral 04/08/2014   Procedure:  BILATERAL AXILLARY LYMPH NODE DISSECTION;  Surgeon: Autumn Messing III, MD;  Location: [facility];  Service: General;  Laterality: Bilateral;  . BREAST BIOPSY Bilateral   . BREAST LUMPECTOMY WITH RADIOACTIVE SEED LOCALIZATION Bilateral 04/08/2014   Procedure: BILATERAL  RADIOACTIVE SEED LOCALIZATION LUMPECTOMY ;  Surgeon: Autumn Messing III, MD;  Location: [place] [facility];  Service: General;  Laterality: Bilateral;  . CESAREAN SECTION     x3  . CHOLECYSTECTOMY    . open heart surgery    . PORT A CATH REVISION  1/15   put in   . TUBAL LIGATION       FAMILY HISTORY: family history includes Breast cancer in her maternal aunt.   SOCIAL HISTORY:  reports that she quit smoking about 3 years ago. Her smoking use included Cigarettes. She has a 60.00 pack-year smoking history. She has never used smokeless tobacco. She reports that she does not drink alcohol or use drugs.   ALLERGIES: Codeine and Morphine and related   MEDICATIONS:  Current Outpatient Prescriptions  Medication Sig Dispense Refill  . B Complex-C (SUPER B COMPLEX PO) Take 1 tablet by mouth daily.    Marland Kitchen  calcium-vitamin D (OSCAL-500) 500-400 MG-UNIT tablet Take 1 tablet by mouth 2 (two) times daily. 60 tablet 3  . docusate sodium (COLACE) 100 MG capsule Take 1 capsule (100 mg total) by mouth every 12 (twelve) hours. 60 capsule 0  . gabapentin (NEURONTIN) 300 MG capsule TAKE 2  CAPSULES (600 MG TOTAL) BY MOUTH 3 (THREE) TIMES DAILY. 180 capsule 2  . LORazepam (ATIVAN) 1 MG tablet Take 1 tablet (1 mg total) by mouth at bedtime. 30 tablet 0  . ondansetron (ZOFRAN) 4 MG tablet Take 1 tablet (4 mg total) by mouth every 8 (eight) hours as needed for nausea or vomiting. 30 tablet 0  . oxyCODONE-acetaminophen (PERCOCET/ROXICET) 5-325 MG tablet 1 tabs PO q6h prn pain 60 tablet 0  . prochlorperazine (COMPAZINE) 10 MG tablet TAKE 1 TABLET EVERY 6 HOURS AS NEEDED NAUEA OR VOMITING 30 tablet 1  . traZODone (DESYREL) 50 MG tablet Take 1 tablet (50 mg total) by mouth at bedtime. 7 tablet 0  . HYDROcodone-acetaminophen (NORCO/VICODIN) 5-325 MG tablet Take 1 tablet by mouth every 4 (four) hours as needed. (Patient not taking: Reported on 06/15/2016) 5 tablet 0  . nystatin (MYCOSTATIN) 100000 UNIT/ML suspension Take 5 mLs (500,000 Units total) by mouth 4 (four) times daily. 60 mL 0  . oxyCODONE (OXYCONTIN) 10 mg 12 hr tablet Take 1 tablet (10 mg total) by mouth every 12 (twelve) hours. (Patient not taking: Reported on 06/15/2016) 60 tablet 0  . zolpidem (AMBIEN) 10 MG tablet Take 1 tablet (10 mg total) by mouth at bedtime as needed for sleep. 30 tablet 2   No current facility-administered medications for this encounter.    Facility-Administered Medications Ordered in Other Encounters  Medication Dose Route Frequency Provider Last Rate Last Dose  . sodium chloride flush (NS) 0.9 % injection 10 mL  10 mL Intracatheter PRN Nicholas Lose, MD   10 mL at 03/31/16 1352     REVIEW OF SYSTEMS:  A 15 point review of systems is documented in the electronic medical record. This was obtained by the nursing staff. However, I reviewed this with the patient to discuss relevant findings and make appropriate changes.  Pertinent items are noted in HPI.    PHYSICAL EXAM:  height is '5\' 4"'$  (1.626 m) and weight is 138 lb 6.4 oz (62.8 kg). Her oral temperature is 98.6 F (37 C). Her blood pressure is 113/79  and her pulse is 86. Her respiration is 20 and oxygen saturation is 100%.   ECOG = 1  0 - Asymptomatic (Fully active, able to carry on all predisease activities without restriction)  1 - Symptomatic but completely ambulatory (Restricted in physically strenuous activity but ambulatory and able to carry out work of a light or sedentary nature. For example, light housework, office work)  2 - Symptomatic, <50% in bed during the day (Ambulatory and capable of all self care but unable to carry out any work activities. Up and about more than 50% of waking hours)  3 - Symptomatic, >50% in bed, but not bedbound (Capable of only limited self-care, confined to bed or chair 50% or more of waking hours)  4 - Bedbound (Completely disabled. Cannot carry on any self-care. Totally confined to bed or chair)  5 - Death   Eustace Pen MM, Creech RH, Tormey DC, et al. [phone]). "Toxicity and response criteria of the [facility] [employer]". [place] Oncol. 5 (6): 649-55  General: Well-developed, in no acute distress HEENT: Normocephalic, atraumatic Cardiovascular: Regular rate and  rhythm Respiratory: Clear to auscultation bilaterally GI: Soft, nontender, normal bowel sounds Extremities: No edema present Lymphadenopathy: The patient has recurrent disease in the left supra-auricular region/axillary region on the left. This is easily palpable and firm.    LABORATORY DATA:  Lab Results  Component Value Date   WBC 30.0 (H) 06/16/2016   HGB 9.4 (L) 06/16/2016   HCT 29.4 (L) 06/16/2016   MCV 74.2 (L) 06/16/2016   PLT 310 06/16/2016   Lab Results  Component Value Date   NA 139 06/16/2016   K 3.9 06/16/2016   CL 102 06/09/2016   CO2 25 06/16/2016   Lab Results  Component Value Date   ALT 31 06/16/2016   AST 39 (H) 06/16/2016   ALKPHOS 148 06/16/2016   BILITOT 0.39 06/16/2016      RADIOGRAPHY: Dg Thoracic Spine 2 View  Result Date: 06/09/2016 CLINICAL DATA:  Back pain EXAM: LUMBAR SPINE  - COMPLETE 4+ VIEW; THORACIC SPINE 2 VIEWS COMPARISON:  None. FINDINGS: There is a right chest wall Port-A-Cath with tip in the proximal right atrium. Alignment of the thoracic spine is normal. There is no compression fracture. No advanced degenerative disease. There are 5 non-rib-bearing lumbar vertebral bodies. Oblique view show no evidence of pars interarticularis fracture. There is no listhesis. There is facet hypertrophy at L5-S1. Disc spaces and vertebral body heights are preserved. IMPRESSION: No compression fracture or static subluxation of the thoracic or lumbar spine. Electronically Signed   By: Ulyses Jarred M.D.   On: 06/09/2016 00:45   Dg Lumbar Spine Complete  Result Date: 06/09/2016 CLINICAL DATA:  Back pain EXAM: LUMBAR SPINE - COMPLETE 4+ VIEW; THORACIC SPINE 2 VIEWS COMPARISON:  None. FINDINGS: There is a right chest wall Port-A-Cath with tip in the proximal right atrium. Alignment of the thoracic spine is normal. There is no compression fracture. No advanced degenerative disease. There are 5 non-rib-bearing lumbar vertebral bodies. Oblique view show no evidence of pars interarticularis fracture. There is no listhesis. There is facet hypertrophy at L5-S1. Disc spaces and vertebral body heights are preserved. IMPRESSION: No compression fracture or static subluxation of the thoracic or lumbar spine. Electronically Signed   By: Ulyses Jarred M.D.   On: 06/09/2016 00:45       IMPRESSION:  The patient has metastatic breast cancer at this time with significant recurrence in the left supraclavicular region. I have had a chance to review the patient's prior imaging. A significant amount of the disease is adjacent to the patient's prior radiation treatment fields although there is some overlap as well with the left supraclavicular fields. Given the dose to this region previously, corresponding to 45 gray, I believe there certainly is additional room to treat this disease in a palliative manner. I  discussed the possible side effects and risks of the treatment which would include some degree reirradiation. We discussed the possible risks of lymphedema as well as nerve damage. We also discussed the possible benefit of treatment. The patient indicated that she did wish to proceed with radiation treatment after Thanksgiving.   PLAN: The patient will be scheduled for a simulation within the next couple of weeks such that we can proceed with treatment. I anticipate that she will receive concurrent chemotherapy during this time and I plan to treat her for approximately 2-3 weeks.    ________________________________   Jodelle Gross, MD, PhD   **Disclaimer: This note was dictated with voice recognition software. Similar sounding words can inadvertently be transcribed and this  note may contain transcription errors which may not have been corrected upon publication of note.**

## 2016-06-21 ENCOUNTER — Other Ambulatory Visit: Payer: Self-pay | Admitting: *Deleted

## 2016-06-21 DIAGNOSIS — C50919 Malignant neoplasm of unspecified site of unspecified female breast: Secondary | ICD-10-CM

## 2016-06-23 ENCOUNTER — Other Ambulatory Visit: Payer: Medicare Other

## 2016-06-23 ENCOUNTER — Encounter: Payer: Self-pay | Admitting: *Deleted

## 2016-06-23 ENCOUNTER — Other Ambulatory Visit: Payer: Self-pay

## 2016-06-23 ENCOUNTER — Ambulatory Visit (HOSPITAL_BASED_OUTPATIENT_CLINIC_OR_DEPARTMENT_OTHER): Payer: Medicare Other

## 2016-06-23 ENCOUNTER — Ambulatory Visit: Payer: Medicare Other

## 2016-06-23 ENCOUNTER — Other Ambulatory Visit (HOSPITAL_BASED_OUTPATIENT_CLINIC_OR_DEPARTMENT_OTHER): Payer: Medicare Other

## 2016-06-23 VITALS — BP 121/74 | HR 76 | Temp 98.4°F | Resp 17

## 2016-06-23 DIAGNOSIS — C7951 Secondary malignant neoplasm of bone: Secondary | ICD-10-CM

## 2016-06-23 DIAGNOSIS — C50919 Malignant neoplasm of unspecified site of unspecified female breast: Secondary | ICD-10-CM

## 2016-06-23 DIAGNOSIS — C7801 Secondary malignant neoplasm of right lung: Secondary | ICD-10-CM | POA: Diagnosis not present

## 2016-06-23 DIAGNOSIS — C50012 Malignant neoplasm of nipple and areola, left female breast: Principal | ICD-10-CM

## 2016-06-23 DIAGNOSIS — C50911 Malignant neoplasm of unspecified site of right female breast: Secondary | ICD-10-CM | POA: Diagnosis not present

## 2016-06-23 DIAGNOSIS — C50912 Malignant neoplasm of unspecified site of left female breast: Secondary | ICD-10-CM

## 2016-06-23 DIAGNOSIS — Z5111 Encounter for antineoplastic chemotherapy: Secondary | ICD-10-CM

## 2016-06-23 DIAGNOSIS — C50011 Malignant neoplasm of nipple and areola, right female breast: Secondary | ICD-10-CM

## 2016-06-23 DIAGNOSIS — D6481 Anemia due to antineoplastic chemotherapy: Secondary | ICD-10-CM | POA: Diagnosis not present

## 2016-06-23 DIAGNOSIS — Z170 Estrogen receptor positive status [ER+]: Principal | ICD-10-CM

## 2016-06-23 DIAGNOSIS — C770 Secondary and unspecified malignant neoplasm of lymph nodes of head, face and neck: Secondary | ICD-10-CM

## 2016-06-23 LAB — CBC WITH DIFFERENTIAL/PLATELET
BASO%: 1.6 % (ref 0.0–2.0)
Basophils Absolute: 0.1 10*3/uL (ref 0.0–0.1)
EOS ABS: 0 10*3/uL (ref 0.0–0.5)
EOS%: 0.6 % (ref 0.0–7.0)
HEMATOCRIT: 29.2 % — AB (ref 34.8–46.6)
HGB: 9.2 g/dL — ABNORMAL LOW (ref 11.6–15.9)
LYMPH#: 1.6 10*3/uL (ref 0.9–3.3)
LYMPH%: 32.5 % (ref 14.0–49.7)
MCH: 23.4 pg — ABNORMAL LOW (ref 25.1–34.0)
MCHC: 31.4 g/dL — AB (ref 31.5–36.0)
MCV: 74.5 fL — AB (ref 79.5–101.0)
MONO#: 0.8 10*3/uL (ref 0.1–0.9)
MONO%: 16.3 % — ABNORMAL HIGH (ref 0.0–14.0)
NEUT%: 49 % (ref 38.4–76.8)
NEUTROS ABS: 2.4 10*3/uL (ref 1.5–6.5)
PLATELETS: 400 10*3/uL (ref 145–400)
RBC: 3.92 10*6/uL (ref 3.70–5.45)
RDW: 19.5 % — ABNORMAL HIGH (ref 11.2–14.5)
WBC: 5 10*3/uL (ref 3.9–10.3)

## 2016-06-23 LAB — COMPREHENSIVE METABOLIC PANEL
ALK PHOS: 90 U/L (ref 40–150)
ALT: 99 U/L — AB (ref 0–55)
ANION GAP: 12 meq/L — AB (ref 3–11)
AST: 120 U/L — ABNORMAL HIGH (ref 5–34)
Albumin: 3.4 g/dL — ABNORMAL LOW (ref 3.5–5.0)
BILIRUBIN TOTAL: 0.83 mg/dL (ref 0.20–1.20)
BUN: 14.2 mg/dL (ref 7.0–26.0)
CALCIUM: 9.7 mg/dL (ref 8.4–10.4)
CO2: 26 meq/L (ref 22–29)
CREATININE: 0.8 mg/dL (ref 0.6–1.1)
Chloride: 101 mEq/L (ref 98–109)
EGFR: 88 mL/min/{1.73_m2} — AB (ref >90)
Glucose: 204 mg/dl — ABNORMAL HIGH (ref 70–140)
Potassium: 3.5 mEq/L (ref 3.5–5.1)
Sodium: 139 mEq/L (ref 136–145)
TOTAL PROTEIN: 7.2 g/dL (ref 6.4–8.3)

## 2016-06-23 MED ORDER — PEGFILGRASTIM 6 MG/0.6ML ~~LOC~~ PSKT
6.0000 mg | PREFILLED_SYRINGE | Freq: Once | SUBCUTANEOUS | Status: AC
  Administered 2016-06-23: 6 mg via SUBCUTANEOUS
  Filled 2016-06-23: qty 0.6

## 2016-06-23 MED ORDER — SODIUM CHLORIDE 0.9% FLUSH
10.0000 mL | INTRAVENOUS | Status: DC | PRN
  Administered 2016-06-23: 10 mL
  Filled 2016-06-23: qty 10

## 2016-06-23 MED ORDER — PROCHLORPERAZINE MALEATE 10 MG PO TABS
ORAL_TABLET | ORAL | Status: AC
  Filled 2016-06-23: qty 1

## 2016-06-23 MED ORDER — SODIUM CHLORIDE 0.9 % IV SOLN
Freq: Once | INTRAVENOUS | Status: AC
  Administered 2016-06-23: 12:00:00 via INTRAVENOUS

## 2016-06-23 MED ORDER — SODIUM CHLORIDE 0.9 % IV SOLN
550.0000 mg/m2 | Freq: Once | INTRAVENOUS | Status: AC
  Administered 2016-06-23: 912 mg via INTRAVENOUS
  Filled 2016-06-23: qty 23.99

## 2016-06-23 MED ORDER — PROCHLORPERAZINE MALEATE 10 MG PO TABS
10.0000 mg | ORAL_TABLET | Freq: Once | ORAL | Status: AC
  Administered 2016-06-23: 10 mg via ORAL

## 2016-06-23 MED ORDER — HEPARIN SOD (PORK) LOCK FLUSH 100 UNIT/ML IV SOLN
500.0000 [IU] | Freq: Once | INTRAVENOUS | Status: AC | PRN
  Administered 2016-06-23: 500 [IU]
  Filled 2016-06-23: qty 5

## 2016-06-23 MED ORDER — SODIUM CHLORIDE 0.9% FLUSH
10.0000 mL | INTRAVENOUS | Status: DC | PRN
  Administered 2016-06-23: 10 mL via INTRAVENOUS
  Filled 2016-06-23: qty 10

## 2016-06-23 NOTE — Patient Instructions (Addendum)
[place] Discharge Instructions for Patients Receiving Chemotherapy  Today you received the following chemotherapy agents:  Gemzar  To help prevent nausea and vomiting after your treatment, we encourage you to take your nausea medication as prescribed.   If you develop nausea and vomiting that is not controlled by your nausea medication, call the clinic.   BELOW ARE SYMPTOMS THAT SHOULD BE REPORTED IMMEDIATELY:  *FEVER GREATER THAN 100.5 F  *CHILLS WITH OR WITHOUT FEVER  NAUSEA AND VOMITING THAT IS NOT CONTROLLED WITH YOUR NAUSEA MEDICATION  *UNUSUAL SHORTNESS OF BREATH  *UNUSUAL BRUISING OR BLEEDING  TENDERNESS IN MOUTH AND THROAT WITH OR WITHOUT PRESENCE OF ULCERS  *URINARY PROBLEMS  *BOWEL PROBLEMS  UNUSUAL RASH Items with * indicate a potential emergency and should be followed up as soon as possible.  Feel free to call the clinic you have any questions or concerns. The clinic phone number is [phone].  Please show the [place] at check-in to the Emergency Department and triage nurse.    Tbo-Filgrastim injection What is this medicine? TBO-FILGRASTIM (T B O fil GRA stim) is a granulocyte colony-stimulating factor that stimulates the growth of neutrophils, a type of white blood cell important in the body's fight against infection. It is used to reduce the incidence of fever and infection in patients with certain types of cancer who are receiving chemotherapy that affects the bone marrow. COMMON BRAND NAME(S): Granix What should I tell my health care provider before I take this medicine? They need to know if you have any of these conditions: -ongoing radiation therapy -sickle cell anemia -an unusual or allergic reaction to tbo-filgrastim, filgrastim, pegfilgrastim, other medicines, foods, dyes, or preservatives -pregnant or trying to get pregnant -breast-feeding How should I use this medicine? This medicine is for injection under the  skin. If you get this medicine at home, you will be taught how to prepare and give this medicine. Refer to the Instructions for Use that come with your medication packaging. Use exactly as directed. Take your medicine at regular intervals. Do not take your medicine more often than directed. It is important that you put your used needles and syringes in a special sharps container. Do not put them in a trash can. If you do not have a sharps container, call your pharmacist or healthcare provider to get one. Talk to your pediatrician regarding the use of this medicine in children. Special care may be needed. What if I miss a dose? It is important not to miss your dose. Call your doctor or health care professional if you miss a dose. What may interact with this medicine? This medicine may interact with the following medications: -medicines that may cause a release of neutrophils, such as lithium What should I watch for while using this medicine? You may need blood work done while you are taking this medicine. What side effects may I notice from receiving this medicine? Side effects that you should report to your doctor or health care professional as soon as possible: -allergic reactions like skin rash, itching or hives, swelling of the face, lips, or tongue -shortness of breath or breathing problems -fever -pain, redness, or irritation at site where injected -pinpoint red spots on the skin -stomach or side pain, or pain at the shoulder -swelling -tiredness -trouble passing urine Side effects that usually do not require medical attention (report to your doctor or health care professional if they continue or are bothersome): -bone pain -muscle pain Where should I  keep my medicine? Keep out of the reach of children. Store in a refrigerator between 2 and 8 degrees C (36 and 46 degrees F). Keep in carton to protect from light. Throw away this medicine if it is left out of the refrigerator for more  than 5 consecutive days. Throw away any unused medicine after the expiration date.  2017 Elsevier/Gold Standard (2015-08-19 12:15:25)

## 2016-06-23 NOTE — Progress Notes (Signed)
Ok to treat pt w/ chemo AST 120 and ALT 99 per Dr. Lindi Adie. Will schedule for Liver ultrasound for pt. Infusion nurse, Elmyra Ricks RN aware.

## 2016-06-23 NOTE — Progress Notes (Signed)
[facility] Psychosocial Distress Screening Clinical Social Work  Clinical Social Work was referred by distress screening protocol.  The patient scored a 10 on the Psychosocial Distress Thermometer which indicates severe distress. Clinical Social Worker reviewed chart and met with pt during treatment  to assess for distress and other psychosocial needs. Pt reports she has had difficulty paying her medical bills/co-pays. She shared she was approved for the [employer] when she had cancer previously and used that all up. CSW reviewed additional options for assistance such as [employer] [facility] [place] and [facility]. Pt reports her anti-anxiety medicine helps at times and she was coloring today during treatment and found that relaxing as well. CSW discussed other coping techniques/resources. CSW encouraged pt to consider applying for additional resources and explained how CSW could assist. Pt plans to review and then reach out to CSW for assistance.   ONCBCN DISTRESS SCREENING 06/15/2016  Screening Type Initial Screening  Distress experienced in past week (1-10) 10  Practical problem type Insurance  Emotional problem type Nervousness/Anxiety;Isolation/feeling alone  Spiritual/Religous concerns type   Information Concerns Type Lack of info about diagnosis;Lack of info about maintaining fitness  Physical Problem type Pain;Tingling hands/feet;Sleep/insomnia;Breathing;Mouth sores/swallowing;Swollen arms/legs  Physician notified of physical symptoms Yes  Referral to clinical social work Yes  Referral to dietition No    Clinical Social Worker follow up needed: Yes.    If yes, follow up plan:  See above Loren Racer, [place] Worker [place]  [facility] Phone: [phone] Fax: [phone]

## 2016-06-24 ENCOUNTER — Other Ambulatory Visit: Payer: Self-pay | Admitting: Hematology and Oncology

## 2016-06-24 ENCOUNTER — Ambulatory Visit: Payer: Medicare Other

## 2016-06-24 DIAGNOSIS — C7951 Secondary malignant neoplasm of bone: Secondary | ICD-10-CM

## 2016-06-25 ENCOUNTER — Other Ambulatory Visit: Payer: Self-pay | Admitting: Oncology

## 2016-06-26 ENCOUNTER — Telehealth: Payer: Self-pay

## 2016-06-26 NOTE — Telephone Encounter (Signed)
Called pt and unable to leave vm. No answer. Calling to confirm pt appt for [place] of liver at 08:15am. Pt instruction to keep NPO after midnight. Will attempt to call again later today.

## 2016-06-26 NOTE — Telephone Encounter (Signed)
Pt called to clarify appts. Told her about CT and [place] abd. She wrote information down Pt then expained that after Chemo on 11/24 that Saturday she was so weak and tired she could not get out of bed. Her heart was also racing 119-130. No nause, no diarrhea. She did not call. She states she is feeling better today.

## 2016-06-27 ENCOUNTER — Encounter (HOSPITAL_COMMUNITY): Payer: Self-pay

## 2016-06-27 ENCOUNTER — Ambulatory Visit (HOSPITAL_COMMUNITY)
Admission: RE | Admit: 2016-06-27 | Discharge: 2016-06-27 | Disposition: A | Discharge status: Home / Self Care | Payer: Medicare Other | Source: Ambulatory Visit | Attending: Hematology and Oncology | Admitting: Hematology and Oncology

## 2016-06-27 DIAGNOSIS — Z170 Estrogen receptor positive status [ER+]: Secondary | ICD-10-CM | POA: Insufficient documentation

## 2016-06-27 DIAGNOSIS — C50912 Malignant neoplasm of unspecified site of left female breast: Secondary | ICD-10-CM | POA: Diagnosis not present

## 2016-06-27 DIAGNOSIS — C7801 Secondary malignant neoplasm of right lung: Secondary | ICD-10-CM | POA: Diagnosis present

## 2016-06-27 DIAGNOSIS — C50911 Malignant neoplasm of unspecified site of right female breast: Secondary | ICD-10-CM

## 2016-06-27 MED ORDER — IOPAMIDOL (ISOVUE-300) INJECTION 61%
75.0000 mL | Freq: Once | INTRAVENOUS | Status: AC | PRN
  Administered 2016-06-27: 75 mL via INTRAVENOUS

## 2016-07-03 ENCOUNTER — Ambulatory Visit (HOSPITAL_COMMUNITY): Admission: RE | Admit: 2016-07-03 | Payer: Medicare Other | Source: Ambulatory Visit

## 2016-07-04 ENCOUNTER — Other Ambulatory Visit: Payer: Self-pay | Admitting: Hematology and Oncology

## 2016-07-04 DIAGNOSIS — C7951 Secondary malignant neoplasm of bone: Secondary | ICD-10-CM

## 2016-07-04 NOTE — Telephone Encounter (Signed)
Called in prescription refill for ambien.

## 2016-07-06 ENCOUNTER — Other Ambulatory Visit: Payer: Self-pay

## 2016-07-06 DIAGNOSIS — C50012 Malignant neoplasm of nipple and areola, left female breast: Principal | ICD-10-CM

## 2016-07-06 DIAGNOSIS — C7951 Secondary malignant neoplasm of bone: Secondary | ICD-10-CM

## 2016-07-06 DIAGNOSIS — Z170 Estrogen receptor positive status [ER+]: Principal | ICD-10-CM

## 2016-07-06 DIAGNOSIS — C50919 Malignant neoplasm of unspecified site of unspecified female breast: Secondary | ICD-10-CM

## 2016-07-06 DIAGNOSIS — C50011 Malignant neoplasm of nipple and areola, right female breast: Secondary | ICD-10-CM

## 2016-07-06 DIAGNOSIS — C7801 Secondary malignant neoplasm of right lung: Secondary | ICD-10-CM

## 2016-07-06 NOTE — Assessment & Plan Note (Deleted)
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 N1 M0 stage IIB Metastatic diseasediagnosed 05/28/15 (subpectoral masses 3.5 cm and 4.5 cm. Four lung nodules the largest 1.4 cm in the right lung 1.7 cm sclerotic right iliac bone lesion) Left subpectoral mass biopsy 06/21/2015: Invasive high-grade ductal carcinoma ER 5%, PR 0%, HER-2 negative ratio 1.29 Prior treatment:  1. Xeloda 1500 mg by mouth twice a day started 07/07/2015 stopped 09/03/2015 (stopped for progression) 2. Carboplatin day 1 and gemcitabine days 1 and 8 every 3 weeks started 09/10/2015; completed cycle 5 01/20/2016 (carboplatin discontinued for neuropathy) 3. Complete treatment break for 2 months ------------------------------------------------------------------------------------------------------------------------------------------ Current treatment: Carboplatin and gemcitabine (carboplatin added because of progression of disease 04/14/2016), today is cycle 12 day 1 q 3 weeks  CT chest 04/06/2016: Interval progression of pre-existing lung nodules new lung metastases (41m, 178m 2.9 cm, 1.6 cm), interval progression of disease in the left breast 4.2 cm (was 2.6 cm), additional nodules 2.3 cm and 3.2 cm; CT head: scalp mass 1.9 cm  Chemotherapy toxicities: 1. Fatigue 2. Anemia due to chemotherapy: hgb improved mildly to 9.0 3. Severe leukocytosis with neutrophils markedly increased due to Neulasta: Neulasta discontinued , will resume Neulasta because of neutropenia 4. Grade 2 neuropathy  Pain issues: Oxycontin for long acting pain medication. Pain well controlled Insomnia:  D/Ced Ativan and started restoril. Advised to try melatonin 75m1ms well Lymphedema: physical therapy. Continue to wear compression sleeves daily. Bone mets: Xgeva q 6 weeks with calcium and Vit D CT chest 06/27/16: Stable lesions in lt breast and deep to Left pectoralis, cystic lesion loc ant in left breast with enh nodular comp which inc compared to previous

## 2016-07-07 ENCOUNTER — Telehealth: Payer: Self-pay | Admitting: Hematology and Oncology

## 2016-07-07 ENCOUNTER — Ambulatory Visit: Payer: Medicare Other | Admitting: Hematology and Oncology

## 2016-07-07 ENCOUNTER — Telehealth: Payer: Self-pay

## 2016-07-07 ENCOUNTER — Other Ambulatory Visit: Payer: Medicare Other

## 2016-07-07 ENCOUNTER — Ambulatory Visit: Payer: Medicare Other

## 2016-07-07 NOTE — Telephone Encounter (Signed)
sw pt to confirm r/s appt to 12/11 per LOS

## 2016-07-07 NOTE — Telephone Encounter (Signed)
Called pt to remind her that she had an appt today. Pt stated that she called at 3am today and left vm. Told pt that appointment cancellation message probably went to after hours line. Nurse did not receive any vm of her cancellation today. Pt states that her mother was sick and cannot come for her treatment. Wants to reschedule next week as asap and early as possible. Sent message to scheduling for appts on Monday 12/11. Called charge nurse in infusion (Diane,RN) and is aware of cancellation.

## 2016-07-07 NOTE — Progress Notes (Deleted)
Patient Care Team: [name], MD as PCP - General (Family Medicine) [name], MD (Cardiology)  DIAGNOSIS:  Encounter Diagnoses  Name Primary?  . 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 Neuropathy due to chemotherapeutic drug ([place])   . Bone metastases ([place])   . 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 Chemotherapy-induced thrombocytopenia   . 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 N1 M0 stage IIB ER 100%, PR 0%, HER-2       06/17/2014 -  Radiation Therapy    Adjuvant radiation therapy      09/14/2014 -  Anti-estrogen oral therapy    Anastrozole 1 mg daily      05/28/2015 Imaging    CT scans: Enlarging subpectoral masses 3.1 x 3.5 cm, posteriorly lower density mass 4.5 x  2.1 cm, several right-sided lung nodules right lower lobe 1.4 cm, 3 other right lung nodules, 1.7 cm right iliac bone lesion      06/21/2015 Procedure    Left subpectoral mass biopsy: Invasive high-grade ductal carcinoma ER 5%, PR 0%, HER-2 negative ratio 1.29      09/01/2015 Imaging    Left chest wall mass increased in size 7.2 x 5.1 cm, multiple subcutaneous nodules, increase in the lung nodules both in number as well as in the size of existing nodules      09/10/2015 -  Chemotherapy    Carboplatin, gemcitabine days 1 and 8 q 3 weeks, carboplatin discontinued for neuropathy (treatment break from 01/20/2016 to 03/24/2016); Added Carboplatin back 04/14/16 (for progression)      01/26/2016 Imaging    Marked improvement in size of lung nodules with many of the nodules resolved index right lower lobe nodule 1.9 x 1.8 cm is now 0.7 x 0.6 cm, reduction in the size of left breast mass 7.2 cm down to 2.6 cm, enlargement in the hypodense mass in the uterus      04/06/2016 Imaging    CT chest: Interval progression of pre-existing lung nodules new lung metastases (65m, 172m 2.9 cm, 1.6 cm), interval progression of disease in the left breast 4.2 cm (was 2.6 cm), additional nodules 2.3 cm  and 3.2 cm; CT head: scalp mass 1.9 cm      06/27/2016 Imaging    Ct chest: Stable lesions in lt breast and deep to Left pectoralis, cystic lesion loc ant in left breast with enh nodular comp which inc compared to previous       CHIEF COMPLIANT: Follow-up to discuss the CT scan results  INTERVAL HISTORY: Paula Navarro is a 65 year old with above-mentioned history of metastatic breast cancer who has a large chest wall mass that's being treated with palliative chemotherapy. She had a recent CT scan on 06/27/2016. She is here today to discuss the results. She is currently on palliative chemotherapy with carboplatin and gemcitabine.. Watching her neuropathy symptoms very closely. She had been tolerating the chemotherapy  part fairly well. She was having increasing left chest wall pain and be before her to radiation therapy. She was also getting a second opinion through [facility]:   Constitutional: Denies fevers, chills or abnormal weight loss Eyes: Denies blurriness of vision Ears, nose, mouth, throat, and face: Denies mucositis or sore throat Respiratory: Denies cough, dyspnea or wheezes Cardiovascular: Denies palpitation, chest discomfort Gastrointestinal:  Denies nausea, heartburn or change in bowel habits Skin: Denies abnormal skin rashes Lymphatics: Denies new lymphadenopathy or easy bruising Neurological:Denies numbness, tingling or new weaknesses Behavioral/Psych: Mood is stable, no new changes  Extremities: No lower extremity edema Breast:  Pain and discomfort in the left chest wall mass  All other systems were reviewed with the patient and are negative.  I have reviewed the past medical history, past surgical history, social history and family history with the patient and they are unchanged from previous note.  ALLERGIES:  is allergic to codeine and morphine and related.  MEDICATIONS:  Current Outpatient Prescriptions  Medication Sig Dispense Refill  . B Complex-C (SUPER B COMPLEX PO) Take 1 tablet by mouth daily.    . calcium-vitamin D (OSCAL-500) 500-400 MG-UNIT tablet Take 1 tablet by mouth 2 (two) times daily. 60 tablet 3  . docusate sodium (COLACE) 100 MG capsule Take 1 capsule (100 mg total) by mouth every 12 (twelve) hours. 60 capsule 0  . gabapentin (NEURONTIN) 300 MG capsule TAKE 2 CAPSULES (600 MG TOTAL) BY MOUTH 3 (THREE) TIMES DAILY. 180 capsule 2  . HYDROcodone-acetaminophen (NORCO/VICODIN) 5-325 MG tablet Take 1 tablet by mouth every 4 (four) hours as needed. (Patient not taking: Reported on 06/15/2016) 5 tablet 0  . LORazepam (ATIVAN) 1 MG tablet Take 1 tablet (1 mg total) by mouth at bedtime. 30 tablet 0  . nystatin (MYCOSTATIN) 100000 UNIT/ML suspension  Take 5 mLs (500,000 Units total) by mouth 4 (four) times daily. 60 mL 0  . ondansetron (ZOFRAN) 4 MG tablet Take 1 tablet (4 mg total) by mouth every 8 (eight) hours as needed for nausea or vomiting. 30 tablet 0  . oxyCODONE (OXYCONTIN) 10 mg 12 hr tablet Take 1 tablet (10 mg total) by mouth every 12 (twelve) hours. (Patient not taking: Reported on 06/15/2016) 60 tablet 0  . oxyCODONE-acetaminophen (PERCOCET/ROXICET) 5-325 MG tablet 1 tabs PO q6h prn pain 60 tablet 0  . prochlorperazine (COMPAZINE) 10 MG tablet TAKE 1 TABLET EVERY 6 HOURS AS NEEDED NAUEA OR VOMITING 30 tablet 1  . traZODone (DESYREL) 50 MG tablet Take 1 tablet (50 mg total) by mouth at bedtime. 7 tablet 0  . zolpidem (AMBIEN) 10 MG tablet TAKE 1 TABLET BY MOUTH AT BEDTIME 30 tablet 2   No current facility-administered medications  for this visit.    Facility-Administered Medications Ordered in Other Visits  Medication Dose Route Frequency Provider Last Rate Last Dose  . sodium chloride flush (NS) 0.9 % injection 10 mL  10 mL Intracatheter PRN Nicholas Lose, MD   10 mL at 03/31/16 1352    PHYSICAL EXAMINATION: ECOG PERFORMANCE STATUS: 1 - Symptomatic but completely ambulatory  There were no vitals filed for this visit. There were no vitals filed for this visit.  GENERAL:alert, no distress and comfortable SKIN: skin color, texture, turgor are normal, no rashes or significant lesions EYES: normal, Conjunctiva are pink and non-injected, sclera clear OROPHARYNX:no exudate, no erythema and lips, buccal mucosa, and tongue normal  NECK: supple, thyroid normal size, non-tender, without nodularity LYMPH:  no palpable lymphadenopathy in the cervical, axillary or inguinal LUNGS: clear to auscultation and percussion with normal breathing effort HEART: regular rate & rhythm and no murmurs and no lower extremity edema ABDOMEN:abdomen soft, non-tender and normal bowel sounds MUSCULOSKELETAL:no cyanosis of digits and no clubbing  NEURO:  alert & oriented x 3 with fluent speech, no focal motor/sensory deficits EXTREMITIES: No lower extremity edema Breast: Left chest wall mass  LABORATORY DATA:  I have reviewed the data as listed   Chemistry      Component Value Date/Time   NA 139 06/23/2016 1016   K 3.5 06/23/2016 1016   CL 102 06/09/2016 0110   CO2 26 06/23/2016 1016   BUN 14.2 06/23/2016 1016   CREATININE 0.8 06/23/2016 1016      Component Value Date/Time   CALCIUM 9.7 06/23/2016 1016   ALKPHOS 90 06/23/2016 1016   AST 120 (H) 06/23/2016 1016   ALT 99 (H) 06/23/2016 1016   BILITOT 0.83 06/23/2016 1016       Lab Results  Component Value Date   WBC 5.0 06/23/2016   HGB 9.2 (L) 06/23/2016   HCT 29.2 (L) 06/23/2016   MCV 74.5 (L) 06/23/2016   PLT 400 06/23/2016   NEUTROABS 2.4 06/23/2016    ASSESSMENT & PLAN:  Bilateral breast cancer ([place]) Treated with neoadjuvant chemotherapy followed by surgery 04/08/2014, radiation treatment completed 07/17/2014, antiestrogen therapy with anastrozole 09/14/2014 Left breastT2, N0, M0, IDC grade 3; 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 N1 M0 stage IIB Metastatic diseasediagnosed 05/28/15 (subpectoral masses 3.5 cm and 4.5 cm. Four lung nodules the largest 1.4 cm in the right lung 1.7 cm sclerotic right iliac bone lesion) Left subpectoral mass biopsy 06/21/2015: Invasive high-grade ductal carcinoma ER 5%, PR 0%, HER-2 negative ratio 1.29 Prior treatment:  1. Xeloda 1500 mg by mouth twice a day started 07/07/2015 stopped 09/03/2015 (stopped for progression) 2. Carboplatin day 1 and gemcitabine days 1 and 8 every 3 weeks started 09/10/2015; completed cycle 5 01/20/2016 (carboplatin discontinued for neuropathy) 3. Complete treatment break for 2  months ------------------------------------------------------------------------------------------------------------------------------------------ Current treatment: Carboplatin and gemcitabine (carboplatin added because of progression of disease 04/14/2016), today is cycle 12 day 1 q 3 weeks  CT chest 04/06/2016: Interval progression of pre-existing lung nodules new lung metastases (41m, 179m 2.9 cm, 1.6 cm), interval progression of disease in the left breast 4.2 cm (was 2.6 cm), additional nodules 2.3 cm and 3.2 cm; CT head: scalp mass 1.9 cm  Chemotherapy toxicities: 1. Fatigue 2. Anemia due to chemotherapy: hgb improved mildly to 9.0 3. Severe leukocytosis with neutrophils  markedly increased due to Neulasta: Neulasta discontinued , will resume Neulasta because of neutropenia 4. Grade 2 neuropathy  Pain issues: Oxycontin for long acting pain medication. Pain well controlled Insomnia: D/Ced Ativan and started restoril. Advised to try melatonin 17m as well Lymphedema: physical therapy. Continue to wear compression sleeves daily. Bone mets: Xgeva q 6 weeks with calcium and Vit D CT chest 06/27/16: Stable lesions in lt breast and deep to Left pectoralis, cystic lesion loc ant in left breast with enh nodular comp which inc compared to previous  Recommendation: 1. Palliative radiation therapy 2. continuation of systemic chemotherapy 3. Sending for [employer] One testing, MSI and BRCA testing   No orders of the defined types were placed in this encounter.  The patient has a good understanding of the overall plan. she agrees with it. she will call with any problems that may develop before the next visit here.   GRulon Eisenmenger MD 07/07/16

## 2016-07-09 NOTE — Assessment & Plan Note (Signed)
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 N1 M0 stage IIB Metastatic diseasediagnosed 05/28/15 (subpectoral masses 3.5 cm and 4.5 cm. Four lung nodules the largest 1.4 cm in the right lung 1.7 cm sclerotic right iliac bone lesion) Left subpectoral mass biopsy 06/21/2015: Invasive high-grade ductal carcinoma ER 5%, PR 0%, HER-2 negative ratio 1.29 Prior treatment:  1. Xeloda 1500 mg by mouth twice a day started 07/07/2015 stopped 09/03/2015 (stopped for progression) 2. Carboplatin day 1 and gemcitabine days 1 and 8 every 3 weeks started 09/10/2015; completed cycle 5 01/20/2016 (carboplatin discontinued for neuropathy) 3. Complete treatment break for 2 months ------------------------------------------------------------------------------------------------------------------------------------------ Current treatment: Carboplatin and gemcitabine (carboplatin added because of progression of disease 04/14/2016), today is cycle 12 day 1 q 3 weeks  CT chest 04/06/2016: Interval progression of pre-existing lung nodules new lung metastases (41m, 178m 2.9 cm, 1.6 cm), interval progression of disease in the left breast 4.2 cm (was 2.6 cm), additional nodules 2.3 cm and 3.2 cm; CT head: scalp mass 1.9 cm  Chemotherapy toxicities: 1. Fatigue 2. Anemia due to chemotherapy: hgb improved mildly to 9.0 3. Severe leukocytosis with neutrophils markedly increased due to Neulasta: Neulasta discontinued , will resume Neulasta because of neutropenia 4. Grade 2 neuropathy  Pain issues: Oxycontin for long acting pain medication. Pain well controlled Insomnia:  D/Ced Ativan and started restoril. Advised to try melatonin 75m1ms well Lymphedema: physical therapy. Continue to wear compression sleeves daily. Bone mets: Xgeva q 6 weeks with calcium and Vit D CT chest 06/27/16: Stable lesions in lt breast and deep to Left pectoralis, cystic lesion loc ant in left breast with enh nodular comp which inc compared to previous

## 2016-07-10 ENCOUNTER — Encounter: Payer: Self-pay | Admitting: Hematology and Oncology

## 2016-07-10 ENCOUNTER — Ambulatory Visit (HOSPITAL_BASED_OUTPATIENT_CLINIC_OR_DEPARTMENT_OTHER): Payer: Medicare Other | Admitting: Hematology and Oncology

## 2016-07-10 ENCOUNTER — Ambulatory Visit (HOSPITAL_BASED_OUTPATIENT_CLINIC_OR_DEPARTMENT_OTHER): Payer: Medicare Other

## 2016-07-10 ENCOUNTER — Other Ambulatory Visit (HOSPITAL_BASED_OUTPATIENT_CLINIC_OR_DEPARTMENT_OTHER): Payer: Medicare Other

## 2016-07-10 ENCOUNTER — Ambulatory Visit: Payer: Medicare Other

## 2016-07-10 DIAGNOSIS — C7951 Secondary malignant neoplasm of bone: Secondary | ICD-10-CM

## 2016-07-10 DIAGNOSIS — C50011 Malignant neoplasm of nipple and areola, right female breast: Secondary | ICD-10-CM

## 2016-07-10 DIAGNOSIS — Z5111 Encounter for antineoplastic chemotherapy: Secondary | ICD-10-CM

## 2016-07-10 DIAGNOSIS — C50012 Malignant neoplasm of nipple and areola, left female breast: Secondary | ICD-10-CM | POA: Diagnosis not present

## 2016-07-10 DIAGNOSIS — C50911 Malignant neoplasm of unspecified site of right female breast: Secondary | ICD-10-CM

## 2016-07-10 DIAGNOSIS — C7801 Secondary malignant neoplasm of right lung: Secondary | ICD-10-CM

## 2016-07-10 DIAGNOSIS — C7800 Secondary malignant neoplasm of unspecified lung: Secondary | ICD-10-CM | POA: Diagnosis not present

## 2016-07-10 DIAGNOSIS — Z170 Estrogen receptor positive status [ER+]: Secondary | ICD-10-CM

## 2016-07-10 DIAGNOSIS — C50912 Malignant neoplasm of unspecified site of left female breast: Secondary | ICD-10-CM

## 2016-07-10 DIAGNOSIS — D6481 Anemia due to antineoplastic chemotherapy: Secondary | ICD-10-CM

## 2016-07-10 DIAGNOSIS — C50919 Malignant neoplasm of unspecified site of unspecified female breast: Secondary | ICD-10-CM

## 2016-07-10 DIAGNOSIS — G609 Hereditary and idiopathic neuropathy, unspecified: Secondary | ICD-10-CM

## 2016-07-10 LAB — COMPREHENSIVE METABOLIC PANEL
ALBUMIN: 2.9 g/dL — AB (ref 3.5–5.0)
ALK PHOS: 123 U/L (ref 40–150)
ALT: 32 U/L (ref 0–55)
AST: 61 U/L — ABNORMAL HIGH (ref 5–34)
Anion Gap: 10 mEq/L (ref 3–11)
BUN: 15 mg/dL (ref 7.0–26.0)
CALCIUM: 8.8 mg/dL (ref 8.4–10.4)
CHLORIDE: 103 meq/L (ref 98–109)
CO2: 23 mEq/L (ref 22–29)
Creatinine: 1 mg/dL (ref 0.6–1.1)
EGFR: 71 mL/min/{1.73_m2} — AB (ref >90)
Glucose: 188 mg/dl — ABNORMAL HIGH (ref 70–140)
POTASSIUM: 3.8 meq/L (ref 3.5–5.1)
SODIUM: 136 meq/L (ref 136–145)
Total Bilirubin: 0.43 mg/dL (ref 0.20–1.20)
Total Protein: 6.9 g/dL (ref 6.4–8.3)

## 2016-07-10 LAB — IRON AND TIBC
%SAT: 27 % (ref 21–57)
Iron: 84 ug/dL (ref 41–142)
TIBC: 315 ug/dL (ref 236–444)
UIBC: 231 ug/dL (ref 120–384)

## 2016-07-10 LAB — CBC WITH DIFFERENTIAL/PLATELET
BASO%: 0.5 % (ref 0.0–2.0)
BASOS ABS: 0.1 10*3/uL (ref 0.0–0.1)
EOS ABS: 0.4 10*3/uL (ref 0.0–0.5)
EOS%: 2 % (ref 0.0–7.0)
HEMATOCRIT: 28.4 % — AB (ref 34.8–46.6)
HEMOGLOBIN: 8.8 g/dL — AB (ref 11.6–15.9)
LYMPH%: 15.3 % (ref 14.0–49.7)
MCH: 23.8 pg — AB (ref 25.1–34.0)
MCHC: 31 g/dL — AB (ref 31.5–36.0)
MCV: 77 fL — AB (ref 79.5–101.0)
MONO#: 1.7 10*3/uL — ABNORMAL HIGH (ref 0.1–0.9)
MONO%: 9 % (ref 0.0–14.0)
NEUT#: 14 10*3/uL — ABNORMAL HIGH (ref 1.5–6.5)
NEUT%: 73.2 % (ref 38.4–76.8)
Platelets: 444 10*3/uL — ABNORMAL HIGH (ref 145–400)
RBC: 3.69 10*6/uL — ABNORMAL LOW (ref 3.70–5.45)
RDW: 21.5 % — AB (ref 11.2–14.5)
WBC: 19.2 10*3/uL — ABNORMAL HIGH (ref 3.9–10.3)
lymph#: 2.9 10*3/uL (ref 0.9–3.3)

## 2016-07-10 LAB — FERRITIN: FERRITIN: 503 ng/mL — AB (ref 9–269)

## 2016-07-10 MED ORDER — DEXAMETHASONE SODIUM PHOSPHATE 10 MG/ML IJ SOLN
10.0000 mg | Freq: Once | INTRAMUSCULAR | Status: AC
  Administered 2016-07-10: 10 mg via INTRAVENOUS

## 2016-07-10 MED ORDER — SODIUM CHLORIDE 0.9 % IV SOLN: 550.0000 mg/m2 | Freq: Once | INTRAVENOUS | Status: DC

## 2016-07-10 MED ORDER — CARBOPLATIN CHEMO INTRADERMAL TEST DOSE 100MCG/0.02ML
100.0000 ug | Freq: Once | INTRADERMAL | Status: AC
  Administered 2016-07-10: 100 ug via INTRADERMAL
  Filled 2016-07-10: qty 0

## 2016-07-10 MED ORDER — PALONOSETRON HCL INJECTION 0.25 MG/5ML
INTRAVENOUS | Status: AC
  Filled 2016-07-10: qty 5

## 2016-07-10 MED ORDER — SODIUM CHLORIDE 0.9% FLUSH
10.0000 mL | INTRAVENOUS | Status: DC | PRN
  Administered 2016-07-10: 10 mL
  Filled 2016-07-10: qty 10

## 2016-07-10 MED ORDER — SODIUM CHLORIDE 0.9 % IV SOLN
550.0000 mg/m2 | Freq: Once | INTRAVENOUS | Status: AC
  Administered 2016-07-10: 912 mg via INTRAVENOUS
  Filled 2016-07-10: qty 23.99

## 2016-07-10 MED ORDER — CARBOPLATIN CHEMO INJECTION 600 MG/60ML
410.5000 mg | Freq: Once | INTRAVENOUS | Status: AC
  Administered 2016-07-10: 410 mg via INTRAVENOUS
  Filled 2016-07-10: qty 41

## 2016-07-10 MED ORDER — PALONOSETRON HCL INJECTION 0.25 MG/5ML
0.2500 mg | Freq: Once | INTRAVENOUS | Status: AC
  Administered 2016-07-10: 0.25 mg via INTRAVENOUS

## 2016-07-10 MED ORDER — HEPARIN SOD (PORK) LOCK FLUSH 100 UNIT/ML IV SOLN
500.0000 [IU] | Freq: Once | INTRAVENOUS | Status: AC | PRN
  Administered 2016-07-10: 500 [IU]
  Filled 2016-07-10: qty 5

## 2016-07-10 MED ORDER — SODIUM CHLORIDE 0.9 % IV SOLN
Freq: Once | INTRAVENOUS | Status: AC
  Administered 2016-07-10: 10:00:00 via INTRAVENOUS

## 2016-07-10 MED ORDER — DEXAMETHASONE SODIUM PHOSPHATE 10 MG/ML IJ SOLN
INTRAMUSCULAR | Status: AC
  Filled 2016-07-10: qty 1

## 2016-07-10 NOTE — Progress Notes (Signed)
Patient Care Team: [name], MD as PCP - General (Family Medicine) [name], MD (Cardiology)  DIAGNOSIS:  Encounter Diagnosis  Name Primary?  . 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 N1 M0 stage IIB ER 100%, PR 0%, HER-2       06/17/2014 -  Radiation Therapy    Adjuvant radiation therapy      09/14/2014 -  Anti-estrogen oral therapy    Anastrozole 1 mg daily      05/28/2015 Imaging    CT scans: Enlarging subpectoral masses 3.1 x 3.5 cm, posteriorly lower density mass 4.5 x 2.1 cm, several right-sided lung nodules right lower lobe 1.4 cm, 3 other right lung nodules, 1.7 cm right iliac bone lesion      06/21/2015 Procedure    Left subpectoral mass biopsy: Invasive high-grade ductal carcinoma ER 5%,  PR 0%, HER-2 negative ratio 1.29      09/01/2015 Imaging    Left chest wall mass increased in size 7.2 x 5.1 cm, multiple subcutaneous nodules, increase in the lung nodules both in number as well as in the size of existing nodules      09/10/2015 -  Chemotherapy    Carboplatin, gemcitabine days 1 and 8 q 3 weeks, carboplatin discontinued for neuropathy (treatment break from 01/20/2016 to 03/24/2016); Added Carboplatin back 04/14/16 (for progression)      01/26/2016 Imaging    Marked improvement in size of lung nodules with many of the nodules resolved index right lower lobe nodule 1.9 x 1.8 cm is now 0.7 x 0.6 cm, reduction in the size of left breast mass 7.2 cm down to 2.6 cm, enlargement in the hypodense mass in the uterus      04/06/2016 Imaging    CT chest: Interval progression of pre-existing lung nodules new lung metastases (68m, 16m 2.9 cm, 1.6 cm), interval progression of disease in the left breast 4.2 cm (was 2.6 cm), additional nodules 2.3 cm and 3.2 cm; CT head: scalp mass 1.9 cm      06/27/2016 Imaging    Ct chest: Stable lesions in lt breast deep to Left pectoralis, cystic lesion loc ant in left breast inc compared  to previous; decrease in lung nodules (13m to 3 mm; 12 mm to 7 mm, RML nodule 2.9 cm to 1.8 cm, RLL 163mto 7 mm)       CHIEF COMPLIANT: Patient is here to review the CT scan  INTERVAL HISTORY: Paula LOWENSTEINs a [age]ear old with above-mentioned history of metastatic breast cancer with lung metastasis. She is currently on palliative chemotherapy with carboplatin and gemcitabine. The scans were done recently and it suggests response to chemotherapy. She is tolerating the treatment fairly well. She does not have any nausea vomiting. Neuropathy is stable.  REVIEW OF SYSTEMS:   Constitutional: Denies fevers, chills or abnormal weight loss Eyes: Denies blurriness of vision Ears, nose, mouth, throat, and face: Denies mucositis or sore throat Respiratory: Denies cough,  dyspnea or wheezes Cardiovascular: Denies palpitation, chest discomfort Gastrointestinal:  Denies nausea, heartburn or change in bowel habits Skin: Denies abnormal skin rashes Lymphatics: Denies new lymphadenopathy or easy bruising Neurological:Denies numbness, tingling or new weaknesses Behavioral/Psych: Mood is stable, no new changes  Extremities: No lower extremity edema Breast:  Left chest wall mass has decreased in size All other systems were reviewed with the patient and are negative.  I have reviewed the past medical history, past surgical history, social history and family history with the patient and they are unchanged from previous note.  ALLERGIES:  is allergic to codeine and morphine and related.  MEDICATIONS:  Current Outpatient Prescriptions  Medication Sig Dispense Refill  . B Complex-C (SUPER B COMPLEX PO) Take 1 tablet by mouth daily.    . calcium-vitamin D (OSCAL-500) 500-400 MG-UNIT tablet Take 1 tablet by mouth 2 (two) times daily. 60 tablet 3  . docusate sodium (COLACE) 100 MG capsule Take 1 capsule (100 mg total) by mouth every 12 (twelve) hours. 60 capsule 0  . gabapentin (NEURONTIN) 300 MG capsule TAKE 2 CAPSULES (600 MG TOTAL) BY MOUTH 3 (THREE) TIMES DAILY. 180 capsule 2  . HYDROcodone-acetaminophen (NORCO/VICODIN) 5-325 MG tablet Take 1 tablet by mouth every 4 (four) hours as needed. (Patient not taking: Reported on 06/15/2016) 5 tablet 0  . LORazepam (ATIVAN) 1 MG tablet Take 1 tablet (1 mg total) by mouth at bedtime. 30 tablet 0  . nystatin (MYCOSTATIN) 100000 UNIT/ML suspension Take 5 mLs (500,000 Units total) by mouth 4 (four) times daily. 60 mL 0  . ondansetron (ZOFRAN) 4 MG tablet Take 1 tablet (4 mg total) by mouth every 8 (eight) hours as needed for nausea or vomiting. 30 tablet 0  . oxyCODONE (OXYCONTIN) 10 mg 12 hr tablet Take 1 tablet (10 mg total) by mouth every 12 (twelve) hours. (Patient not taking: Reported on 06/15/2016) 60 tablet 0  .  oxyCODONE-acetaminophen (PERCOCET/ROXICET) 5-325 MG tablet 1 tabs PO q6h prn pain 60 tablet 0  . prochlorperazine (COMPAZINE) 10 MG tablet TAKE 1 TABLET EVERY 6 HOURS AS NEEDED NAUEA OR VOMITING 30 tablet 1  . traZODone (DESYREL) 50 MG tablet Take 1 tablet (50 mg total) by mouth at bedtime. 7 tablet 0  . zolpidem (AMBIEN) 10 MG tablet TAKE 1 TABLET BY MOUTH AT BEDTIME 30 tablet 2   No current facility-administered medications for this visit.    Facility-Administered Medications Ordered in Other Visits  Medication Dose Route Frequency Provider Last Rate Last Dose  . sodium chloride flush (NS) 0.9 % injection 10 mL  10 mL Intracatheter PRN ViNicholas LoseMD   10 mL at 03/31/16 1352    PHYSICAL EXAMINATION: ECOG PERFORMANCE STATUS: 1 - Symptomatic  but completely ambulatory  There were no vitals filed for this visit. There were no vitals filed for this visit.  GENERAL:alert, no distress and comfortable SKIN: skin color, texture, turgor are normal, no rashes or significant lesions EYES: normal, Conjunctiva are pink and non-injected, sclera clear OROPHARYNX:no exudate, no erythema and lips, buccal mucosa, and tongue normal  NECK: supple, thyroid normal size, non-tender, without nodularity LYMPH:  no palpable lymphadenopathy in the cervical, axillary or inguinal LUNGS: clear to auscultation and percussion with normal breathing effort HEART: regular rate & rhythm and no murmurs and no lower extremity edema ABDOMEN:abdomen soft, non-tender and normal bowel sounds MUSCULOSKELETAL:no cyanosis of digits and no clubbing  NEURO: alert & oriented x 3 with fluent speech, no focal motor/sensory deficits EXTREMITIES: No lower extremity edema BREAST: Clear shrinkage in the left chest wall mass. I suspect the increase in swelling and sometimes shrinkage of related to underlying inflammation rather than disease progression.. (exam performed in the presence of a chaperone)  LABORATORY DATA:  I have  reviewed the data as listed   Chemistry      Component Value Date/Time   NA 139 06/23/2016 1016   K 3.5 06/23/2016 1016   CL 102 06/09/2016 0110   CO2 26 06/23/2016 1016   BUN 14.2 06/23/2016 1016   CREATININE 0.8 06/23/2016 1016      Component Value Date/Time   CALCIUM 9.7 06/23/2016 1016   ALKPHOS 90 06/23/2016 1016   AST 120 (H) 06/23/2016 1016   ALT 99 (H) 06/23/2016 1016   BILITOT 0.83 06/23/2016 1016       Lab Results  Component Value Date   WBC 19.2 (H) 07/10/2016   HGB 8.8 (L) 07/10/2016   HCT 28.4 (L) 07/10/2016   MCV 77.0 (L) 07/10/2016   PLT 444 (H) 07/10/2016   NEUTROABS 14.0 (H) 07/10/2016    ASSESSMENT & PLAN:  Bilateral breast cancer ([place]) Treated with neoadjuvant chemotherapy followed by surgery 04/08/2014, radiation treatment completed 07/17/2014, antiestrogen therapy with anastrozole 09/14/2014 Left breastT2, N0, M0, IDC grade 3; 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 N1 M0 stage IIB Metastatic diseasediagnosed 05/28/15 (subpectoral masses 3.5 cm and 4.5 cm. Four lung nodules the largest 1.4 cm in the right lung 1.7 cm sclerotic right iliac bone lesion) Left subpectoral mass biopsy 06/21/2015: Invasive high-grade ductal carcinoma ER 5%, PR 0%, HER-2 negative ratio 1.29 Prior treatment:  1. Xeloda 1500 mg by mouth twice a day started 07/07/2015 stopped 09/03/2015 (stopped for progression) 2. Carboplatin day 1 and gemcitabine days 1 and 8 every 3 weeks started 09/10/2015; completed cycle 5 01/20/2016 (carboplatin discontinued for neuropathy) 3. Complete treatment break for 2 months ------------------------------------------------------------------------------------------------------------------------------------------ Current treatment: Carboplatin and gemcitabine (carboplatin added because of  progression of disease 04/14/2016), today is cycle 12 day 1 q 3 weeks  Chemotherapy toxicities: 1. Fatigue 2. Anemia due to chemotherapy: Checking iron studies 3. Grade 2 neuropathy: Stable  Pain issues: Oxycontin for long acting pain medication. Pain well controlled. Patient tells me that she will not undergo palliative chest wall radiation because her pain has improved. Insomnia: D/Ced Ativan and started restoril. Advised to try melatonin 59m as well Lymphedema: physical therapy. Continue to wear compression sleeves daily.  Bone mets: Xgeva q 6 weeks with calcium and Vit D CT chest 06/27/16: Ct chest: Stable lesions in lt breast  deep to Left pectoralis, cystic lesion loc ant in left breast inc compared to previous; decrease in lung nodules (52m to 3 mm; 12 mm to 7 mm, RML nodule 2.9 cm to 1.8 cm, RLL 122mto 7 mm)  Microcytic anemia: Hemoglobin 8.8 with MCV of 77. I will add iron studies  I reviewed the results of the CT scan with the patient and provided her with a copy of this report. I recommended continuation of the current chemotherapy regimen since it appears to be shrinking the tumors.   No orders of the defined types were placed in this encounter.  The patient has a good understanding of the overall plan. she agrees with it. she will call with any problems that may develop before the next visit here.   GuRulon EisenmengerMD 07/10/16

## 2016-07-10 NOTE — Patient Instructions (Signed)
[facility] Discharge Instructions for Patients Receiving Chemotherapy  Today you received the following chemotherapy agents:  Carboplatin and Gemzar.  To help prevent nausea and vomiting after your treatment, we encourage you to take your nausea medication as directed.   If you develop nausea and vomiting that is not controlled by your nausea medication, call the clinic.   BELOW ARE SYMPTOMS THAT SHOULD BE REPORTED IMMEDIATELY:  *FEVER GREATER THAN 100.5 F  *CHILLS WITH OR WITHOUT FEVER  NAUSEA AND VOMITING THAT IS NOT CONTROLLED WITH YOUR NAUSEA MEDICATION  *UNUSUAL SHORTNESS OF BREATH  *UNUSUAL BRUISING OR BLEEDING  TENDERNESS IN MOUTH AND THROAT WITH OR WITHOUT PRESENCE OF ULCERS  *URINARY PROBLEMS  *BOWEL PROBLEMS  UNUSUAL RASH Items with * indicate a potential emergency and should be followed up as soon as possible.  Feel free to call the clinic you have any questions or concerns. The clinic phone number is [phone].  Please show the [facility] at check-in to the Emergency Department and triage nurse.

## 2016-07-13 ENCOUNTER — Other Ambulatory Visit: Payer: Self-pay

## 2016-07-13 DIAGNOSIS — C50512 Malignant neoplasm of lower-outer quadrant of left female breast: Secondary | ICD-10-CM

## 2016-07-13 DIAGNOSIS — C50511 Malignant neoplasm of lower-outer quadrant of right female breast: Secondary | ICD-10-CM

## 2016-07-13 DIAGNOSIS — C7951 Secondary malignant neoplasm of bone: Secondary | ICD-10-CM

## 2016-07-13 MED ORDER — ZOLPIDEM TARTRATE 10 MG PO TABS: 10.0000 mg | ORAL_TABLET | Freq: Every day | ORAL | 2 refills | Status: DC

## 2016-07-13 MED ORDER — OXYCODONE-ACETAMINOPHEN 5-325 MG PO TABS: ORAL_TABLET | ORAL | 0 refills | Status: DC

## 2016-07-13 MED FILL — OXYCODONE W/APAP 5/325 TAB: 5-325 | 15 days supply | Qty: 60 | Fill #0

## 2016-07-13 NOTE — Telephone Encounter (Signed)
Pt called to request to have her percocet refilled and her ambien refilled today. Pt will stop by this afternoon. Told pt will get it ready for pick up before 5pm ]

## 2016-07-13 NOTE — Progress Notes (Signed)
Pt sister came to pick up prescription today.

## 2016-07-14 ENCOUNTER — Other Ambulatory Visit: Payer: Medicare Other

## 2016-07-14 ENCOUNTER — Ambulatory Visit: Payer: Medicare Other

## 2016-07-17 ENCOUNTER — Other Ambulatory Visit: Payer: Medicare Other

## 2016-07-17 ENCOUNTER — Ambulatory Visit: Payer: Medicare Other

## 2016-07-18 ENCOUNTER — Ambulatory Visit: Payer: Medicare Other

## 2016-07-19 ENCOUNTER — Other Ambulatory Visit: Payer: Medicare Other

## 2016-07-19 ENCOUNTER — Ambulatory Visit: Payer: Medicare Other

## 2016-07-20 ENCOUNTER — Ambulatory Visit: Payer: Medicare Other

## 2016-07-20 ENCOUNTER — Ambulatory Visit (HOSPITAL_BASED_OUTPATIENT_CLINIC_OR_DEPARTMENT_OTHER): Payer: Medicare Other

## 2016-07-20 VITALS — BP 125/64 | HR 79 | Temp 98.5°F | Resp 18

## 2016-07-20 DIAGNOSIS — C7800 Secondary malignant neoplasm of unspecified lung: Secondary | ICD-10-CM | POA: Diagnosis not present

## 2016-07-20 DIAGNOSIS — C50912 Malignant neoplasm of unspecified site of left female breast: Secondary | ICD-10-CM

## 2016-07-20 DIAGNOSIS — C50012 Malignant neoplasm of nipple and areola, left female breast: Secondary | ICD-10-CM

## 2016-07-20 DIAGNOSIS — C7951 Secondary malignant neoplasm of bone: Secondary | ICD-10-CM | POA: Diagnosis not present

## 2016-07-20 DIAGNOSIS — C50011 Malignant neoplasm of nipple and areola, right female breast: Secondary | ICD-10-CM

## 2016-07-20 DIAGNOSIS — Z5111 Encounter for antineoplastic chemotherapy: Secondary | ICD-10-CM

## 2016-07-20 DIAGNOSIS — C50911 Malignant neoplasm of unspecified site of right female breast: Secondary | ICD-10-CM

## 2016-07-20 DIAGNOSIS — C7801 Secondary malignant neoplasm of right lung: Secondary | ICD-10-CM

## 2016-07-20 DIAGNOSIS — C50919 Malignant neoplasm of unspecified site of unspecified female breast: Secondary | ICD-10-CM

## 2016-07-20 DIAGNOSIS — Z170 Estrogen receptor positive status [ER+]: Principal | ICD-10-CM

## 2016-07-20 LAB — COMPREHENSIVE METABOLIC PANEL
ALK PHOS: 78 U/L (ref 40–150)
ALT: 45 U/L (ref 0–55)
ANION GAP: 11 meq/L (ref 3–11)
AST: 60 U/L — ABNORMAL HIGH (ref 5–34)
Albumin: 3.2 g/dL — ABNORMAL LOW (ref 3.5–5.0)
BILIRUBIN TOTAL: 0.42 mg/dL (ref 0.20–1.20)
BUN: 10.1 mg/dL (ref 7.0–26.0)
CALCIUM: 9.6 mg/dL (ref 8.4–10.4)
CO2: 25 mEq/L (ref 22–29)
CREATININE: 0.8 mg/dL (ref 0.6–1.1)
Chloride: 105 mEq/L (ref 98–109)
Glucose: 199 mg/dl — ABNORMAL HIGH (ref 70–140)
Potassium: 3.4 mEq/L — ABNORMAL LOW (ref 3.5–5.1)
Sodium: 141 mEq/L (ref 136–145)
TOTAL PROTEIN: 7 g/dL (ref 6.4–8.3)

## 2016-07-20 LAB — CBC WITH DIFFERENTIAL/PLATELET
BASO%: 0.5 % (ref 0.0–2.0)
Basophils Absolute: 0 10*3/uL (ref 0.0–0.1)
EOS ABS: 0.1 10*3/uL (ref 0.0–0.5)
EOS%: 1.5 % (ref 0.0–7.0)
HEMATOCRIT: 24.7 % — AB (ref 34.8–46.6)
HGB: 7.9 g/dL — ABNORMAL LOW (ref 11.6–15.9)
LYMPH#: 1.7 10*3/uL (ref 0.9–3.3)
LYMPH%: 26 % (ref 14.0–49.7)
MCH: 24.7 pg — ABNORMAL LOW (ref 25.1–34.0)
MCHC: 32 g/dL (ref 31.5–36.0)
MCV: 77.2 fL — AB (ref 79.5–101.0)
MONO#: 1.1 10*3/uL — AB (ref 0.1–0.9)
MONO%: 16.7 % — ABNORMAL HIGH (ref 0.0–14.0)
NEUT%: 55.3 % (ref 38.4–76.8)
NEUTROS ABS: 3.7 10*3/uL (ref 1.5–6.5)
PLATELETS: 158 10*3/uL (ref 145–400)
RBC: 3.2 10*6/uL — AB (ref 3.70–5.45)
RDW: 19.8 % — ABNORMAL HIGH (ref 11.2–14.5)
WBC: 6.7 10*3/uL (ref 3.9–10.3)

## 2016-07-20 MED ORDER — SODIUM CHLORIDE 0.9 % IV SOLN
Freq: Once | INTRAVENOUS | Status: AC
  Administered 2016-07-20: 15:00:00 via INTRAVENOUS

## 2016-07-20 MED ORDER — SODIUM CHLORIDE 0.9% FLUSH
10.0000 mL | INTRAVENOUS | Status: DC | PRN
  Administered 2016-07-20: 10 mL
  Filled 2016-07-20: qty 10

## 2016-07-20 MED ORDER — SODIUM CHLORIDE 0.9 % IV SOLN
550.0000 mg/m2 | Freq: Once | INTRAVENOUS | Status: AC
  Administered 2016-07-20: 912 mg via INTRAVENOUS
  Filled 2016-07-20: qty 23.99

## 2016-07-20 MED ORDER — PROCHLORPERAZINE MALEATE 10 MG PO TABS
10.0000 mg | ORAL_TABLET | Freq: Once | ORAL | Status: AC
  Administered 2016-07-20: 10 mg via ORAL

## 2016-07-20 MED ORDER — HEPARIN SOD (PORK) LOCK FLUSH 100 UNIT/ML IV SOLN
500.0000 [IU] | Freq: Once | INTRAVENOUS | Status: AC | PRN
  Administered 2016-07-20: 500 [IU]
  Filled 2016-07-20: qty 5

## 2016-07-20 MED ORDER — PROCHLORPERAZINE MALEATE 10 MG PO TABS
ORAL_TABLET | ORAL | Status: AC
  Filled 2016-07-20: qty 1

## 2016-07-20 NOTE — Progress Notes (Signed)
Per Dr. Lindi Adie okay to treat with Hgb of 7.9 and Potassium of 3.4

## 2016-07-20 NOTE — Patient Instructions (Signed)
[facility] Discharge Instructions for Patients Receiving Chemotherapy  Today you received the following chemotherapy agents Gemzar  To help prevent nausea and vomiting after your treatment, we encourage you to take your nausea medication as directed.    If you develop nausea and vomiting that is not controlled by your nausea medication, call the clinic.   BELOW ARE SYMPTOMS THAT SHOULD BE REPORTED IMMEDIATELY:  *FEVER GREATER THAN 100.5 F  *CHILLS WITH OR WITHOUT FEVER  NAUSEA AND VOMITING THAT IS NOT CONTROLLED WITH YOUR NAUSEA MEDICATION  *UNUSUAL SHORTNESS OF BREATH  *UNUSUAL BRUISING OR BLEEDING  TENDERNESS IN MOUTH AND THROAT WITH OR WITHOUT PRESENCE OF ULCERS  *URINARY PROBLEMS  *BOWEL PROBLEMS  UNUSUAL RASH Items with * indicate a potential emergency and should be followed up as soon as possible.  Feel free to call the clinic you have any questions or concerns. The clinic phone number is [phone].  Please show the CHEMO ALERT CARD at check-in to the Emergency Department and triage nurse.   

## 2016-07-22 ENCOUNTER — Ambulatory Visit (HOSPITAL_BASED_OUTPATIENT_CLINIC_OR_DEPARTMENT_OTHER): Payer: Medicare Other

## 2016-07-22 VITALS — BP 117/70 | HR 82 | Temp 98.6°F | Resp 18

## 2016-07-22 DIAGNOSIS — C50011 Malignant neoplasm of nipple and areola, right female breast: Secondary | ICD-10-CM

## 2016-07-22 DIAGNOSIS — Z5111 Encounter for antineoplastic chemotherapy: Secondary | ICD-10-CM

## 2016-07-22 DIAGNOSIS — Z170 Estrogen receptor positive status [ER+]: Secondary | ICD-10-CM

## 2016-07-22 DIAGNOSIS — C50012 Malignant neoplasm of nipple and areola, left female breast: Secondary | ICD-10-CM

## 2016-07-22 DIAGNOSIS — C50912 Malignant neoplasm of unspecified site of left female breast: Secondary | ICD-10-CM

## 2016-07-22 DIAGNOSIS — C50911 Malignant neoplasm of unspecified site of right female breast: Secondary | ICD-10-CM

## 2016-07-22 DIAGNOSIS — C50919 Malignant neoplasm of unspecified site of unspecified female breast: Secondary | ICD-10-CM

## 2016-07-22 MED ORDER — PEGFILGRASTIM INJECTION 6 MG/0.6ML ~~LOC~~
6.0000 mg | PREFILLED_SYRINGE | Freq: Once | SUBCUTANEOUS | Status: AC
  Administered 2016-07-22: 6 mg via SUBCUTANEOUS

## 2016-07-22 NOTE — Patient Instructions (Signed)
Pegfilgrastim injection What is this medicine? PEGFILGRASTIM (PEG fil gra stim) is a long-acting granulocyte colony-stimulating factor that stimulates the growth of neutrophils, a type of white blood cell important in the body's fight against infection. It is used to reduce the incidence of fever and infection in patients with certain types of cancer who are receiving chemotherapy that affects the bone marrow, and to increase survival after being exposed to high doses of radiation. This medicine may be used for other purposes; ask your health care provider or pharmacist if you have questions. COMMON BRAND NAME(S): Neulasta What should I tell my health care provider before I take this medicine? They need to know if you have any of these conditions: -kidney disease -latex allergy -ongoing radiation therapy -sickle cell disease -skin reactions to acrylic adhesives (On-Body Injector only) -an unusual or allergic reaction to pegfilgrastim, filgrastim, other medicines, foods, dyes, or preservatives -pregnant or trying to get pregnant -breast-feeding How should I use this medicine? This medicine is for injection under the skin. If you get this medicine at home, you will be taught how to prepare and give the pre-filled syringe or how to use the On-body Injector. Refer to the patient Instructions for Use for detailed instructions. Use exactly as directed. Take your medicine at regular intervals. Do not take your medicine more often than directed. It is important that you put your used needles and syringes in a special sharps container. Do not put them in a trash can. If you do not have a sharps container, call your pharmacist or healthcare provider to get one. Talk to your pediatrician regarding the use of this medicine in children. While this drug may be prescribed for selected conditions, precautions do apply. Overdosage: If you think you have taken too much of this medicine contact a poison control  center or emergency room at once. NOTE: This medicine is only for you. Do not share this medicine with others. What if I miss a dose? It is important not to miss your dose. Call your doctor or health care professional if you miss your dose. If you miss a dose due to an On-body Injector failure or leakage, a new dose should be administered as soon as possible using a single prefilled syringe for manual use. What may interact with this medicine? Interactions have not been studied. Give your health care provider a list of all the medicines, herbs, non-prescription drugs, or dietary supplements you use. Also tell them if you smoke, drink alcohol, or use illegal drugs. Some items may interact with your medicine. This list may not describe all possible interactions. Give your health care provider a list of all the medicines, herbs, non-prescription drugs, or dietary supplements you use. Also tell them if you smoke, drink alcohol, or use illegal drugs. Some items may interact with your medicine. What should I watch for while using this medicine? You may need blood work done while you are taking this medicine. If you are going to need a MRI, CT scan, or other procedure, tell your doctor that you are using this medicine (On-Body Injector only). What side effects may I notice from receiving this medicine? Side effects that you should report to your doctor or health care professional as soon as possible: -allergic reactions like skin rash, itching or hives, swelling of the face, lips, or tongue -dizziness -fever -pain, redness, or irritation at site where injected -pinpoint red spots on the skin -red or dark-brown urine -shortness of breath or breathing problems -stomach or   side pain, or pain at the shoulder -swelling -tiredness -trouble passing urine or change in the amount of urine Side effects that usually do not require medical attention (report to your doctor or health care professional if they  continue or are bothersome): -bone pain -muscle pain This list may not describe all possible side effects. Call your doctor for medical advice about side effects. You may report side effects to FDA at 1-800-FDA-1088. Where should I keep my medicine? Keep out of the reach of children. Store pre-filled syringes in a refrigerator between 2 and 8 degrees C (36 and 46 degrees F). Do not freeze. Keep in carton to protect from light. Throw away this medicine if it is left out of the refrigerator for more than 48 hours. Throw away any unused medicine after the expiration date. NOTE: This sheet is a summary. It may not cover all possible information. If you have questions about this medicine, talk to your doctor, pharmacist, or health care provider.  2017 Elsevier/Gold Standard (2014-08-06 14:30:14)  

## 2016-07-25 ENCOUNTER — Other Ambulatory Visit: Payer: Self-pay | Admitting: Hematology and Oncology

## 2016-07-25 DIAGNOSIS — C50512 Malignant neoplasm of lower-outer quadrant of left female breast: Secondary | ICD-10-CM

## 2016-07-25 DIAGNOSIS — C50919 Malignant neoplasm of unspecified site of unspecified female breast: Secondary | ICD-10-CM

## 2016-07-25 DIAGNOSIS — C50511 Malignant neoplasm of lower-outer quadrant of right female breast: Secondary | ICD-10-CM

## 2016-07-28 ENCOUNTER — Other Ambulatory Visit: Payer: Self-pay | Admitting: *Deleted

## 2016-07-28 DIAGNOSIS — R110 Nausea: Secondary | ICD-10-CM

## 2016-07-28 MED ORDER — ONDANSETRON HCL 4 MG PO TABS: 4.0000 mg | ORAL_TABLET | Freq: Three times a day (TID) | ORAL | 0 refills | Status: DC | PRN

## 2016-07-28 MED ORDER — LORAZEPAM 1 MG PO TABS: 1.0000 mg | ORAL_TABLET | Freq: Every day | ORAL | 0 refills | Status: DC

## 2016-08-02 NOTE — Assessment & Plan Note (Signed)
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 N1 M0 stage IIB Metastatic diseasediagnosed 05/28/15 (subpectoral masses 3.5 cm and 4.5 cm. Four lung nodules the largest 1.4 cm in the right lung 1.7 cm sclerotic right iliac bone lesion) Left subpectoral mass biopsy 06/21/2015: Invasive high-grade ductal carcinoma ER 5%, PR 0%, HER-2 negative ratio 1.29 Prior treatment:  1. Xeloda 1500 mg by mouth twice a day started 07/07/2015 stopped 09/03/2015 (stopped for progression) 2. Carboplatin day 1 and gemcitabine days 1 and 8 every 3 weeks started 09/10/2015; completed cycle 5 01/20/2016 (carboplatin discontinued for neuropathy) 3. Complete treatment break for 2 months ------------------------------------------------------------------------------------------------------------------------------------------ Current treatment: Carboplatin and gemcitabine (carboplatin added because of progression of disease 04/14/2016), today is cycle 13 day 1, 8 q 3 weeks with neulasta  CT chest 04/06/2016: Interval progression of pre-existing lung nodules new lung metastases (106m, 151m 2.9 cm, 1.6 cm), interval progression of disease in the left breast 4.2 cm (was 2.6 cm), additional nodules 2.3 cm and 3.2 cm; CT head: scalp mass 1.9 cm  Chemotherapy toxicities: 1. Fatigue 2. Anemia due to chemotherapy: hgb improved mildly to 9.0 3. Severe leukocytosis with neutrophils markedly increased due to Neulasta: Neulasta discontinued , will resume Neulasta because of neutropenia 4. Grade 2 neuropathy  Pain issues: Oxycontin for long acting pain medication. Pain well  controlled Insomnia: D/Ced Ativan and started restoril. Advised to try melatonin 53m47ms well Lymphedema: physical therapy. Continue to wear compression sleeves daily. Bone mets: Xgeva q 6 weeks with calcium and Vit D CT chest 06/27/16: Stable lesions in lt breast and deep to Left pectoralis, cystic lesion loc ant in left breast with enh nodular comp which inc compared to previous

## 2016-08-02 NOTE — Progress Notes (Signed)
Patient Care Team: [name], MD as PCP - General (Family Medicine) [name], MD (Cardiology)  DIAGNOSIS:  Encounter Diagnosis  Name Primary?  . 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 N1 M0 stage IIB ER 100%, PR 0%, HER-2       06/17/2014 -  Radiation Therapy    Adjuvant radiation therapy      09/14/2014 -  Anti-estrogen oral therapy    Anastrozole 1 mg daily      05/28/2015 Imaging    CT scans: Enlarging subpectoral masses 3.1 x 3.5 cm, posteriorly lower density mass 4.5 x 2.1 cm, several right-sided lung nodules right lower lobe 1.4 cm, 3 other right lung nodules, 1.7 cm right iliac bone lesion      06/21/2015 Procedure    Left subpectoral mass biopsy: Invasive high-grade ductal carcinoma ER 5%,  PR 0%, HER-2 negative ratio 1.29      09/01/2015 Imaging    Left chest wall mass increased in size 7.2 x 5.1 cm, multiple subcutaneous nodules, increase in the lung nodules both in number as well as in the size of existing nodules      09/10/2015 -  Chemotherapy    Carboplatin, gemcitabine days 1 and 8 q 3 weeks, carboplatin discontinued for neuropathy (treatment break from 01/20/2016 to 03/24/2016); Added Carboplatin back 04/14/16 (for progression)      01/26/2016 Imaging    Marked improvement in size of lung nodules with many of the nodules resolved index right lower lobe nodule 1.9 x 1.8 cm is now 0.7 x 0.6 cm, reduction in the size of left breast mass 7.2 cm down to 2.6 cm, enlargement in the hypodense mass in the uterus      04/06/2016 Imaging    CT chest: Interval progression of pre-existing lung nodules new lung metastases (89m, 129m 2.9 cm, 1.6 cm), interval progression of disease in the left breast 4.2 cm (was 2.6 cm), additional nodules 2.3 cm and 3.2 cm; CT head: scalp mass 1.9 cm      06/27/2016 Imaging    Ct chest: Stable lesions in lt breast deep to Left pectoralis, cystic lesion loc ant in left breast inc compared  to previous; decrease in lung nodules (77m to 3 mm; 12 mm to 7 mm, RML nodule 2.9 cm to 1.8 cm, RLL 129mto 7 mm)       CHIEF COMPLIANT: Cycle 13 chemo  INTERVAL HISTORY: Paula RETAs a [age]ear old with above-mentioned history of metastatic breast cancer with lung metastasis. She is currently on palliative chemotherapy with carboplatin and gemcitabine. The scans were done recently and it suggests response to chemotherapy. She is tolerating the treatment fairly well. She does not have any nausea vomiting. Neuropathy is stable. Complains of spotting vaginally related to uterine polyps. She also has a lesion on the scalp which looks like a sebaceous cyst.  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  I have reviewed the past medical history, past surgical history, social history and family history with the patient and they are unchanged from previous note.  ALLERGIES:  is allergic to codeine and morphine and related.  MEDICATIONS:  Current Outpatient Prescriptions  Medication Sig Dispense Refill  . B Complex-C (SUPER B COMPLEX PO) Take 1 tablet by mouth daily.    . calcium-vitamin D (OSCAL-500) 500-400 MG-UNIT tablet Take 1 tablet by mouth 2 (two) times daily. 60 tablet 3  . docusate sodium (COLACE) 100 MG capsule Take 1 capsule (100 mg total) by mouth every 12 (twelve) hours. 60 capsule 0  . gabapentin (NEURONTIN) 300 MG capsule TAKE 2 CAPSULES (600 MG TOTAL) BY MOUTH 3 (THREE) TIMES DAILY. 180 capsule 2  . LORazepam (ATIVAN) 1 MG tablet Take 1 tablet (1 mg total) by mouth at bedtime. 30 tablet 0  . nystatin (MYCOSTATIN) 100000 UNIT/ML suspension Take 5 mLs (500,000 Units total) by mouth 4 (four) times daily. 60 mL 0  . ondansetron (ZOFRAN) 4 MG tablet Take 1 tablet (4 mg total) by mouth every 8 (eight) hours as needed for nausea or vomiting. 30 tablet 0  . oxyCODONE-acetaminophen (PERCOCET/ROXICET) 5-325 MG tablet 1 tabs PO q6h prn pain 60 tablet 0  . prochlorperazine (COMPAZINE) 10 MG tablet TAKE 1 TABLET EVERY 6 HOURS AS NEEDED NAUEA OR VOMITING 30 tablet 1  .  traZODone (DESYREL) 50 MG tablet Take 1 tablet (50 mg total) by mouth at bedtime. 7 tablet 0  . zolpidem (AMBIEN) 10 MG tablet Take 1 tablet (10 mg total) by mouth at bedtime. 30 tablet 2   No current facility-administered medications for this visit.    Facility-Administered Medications Ordered in Other Visits  Medication Dose Route Frequency Provider Last Rate Last Dose  . sodium chloride flush (NS) 0.9 % injection 10 mL  10 mL Intracatheter PRN ViNicholas LoseMD   10 mL at 03/31/16 1352    PHYSICAL EXAMINATION: ECOG PERFORMANCE STATUS: 1 - Symptomatic but completely ambulatory  Vitals:   08/03/16 0920  BP: 115/74  Pulse: 79  Resp: 18  Temp: 98.7 F (37.1 C)   Filed Weights   08/03/16 0920  Weight: 132 lb 14.4 oz (60.3 kg)    GENERAL:alert, no distress and comfortable SKIN: skin color, texture, turgor are normal, no rashes or significant lesions EYES: normal, Conjunctiva are pink and non-injected, sclera clear OROPHARYNX:no exudate, no erythema and lips, buccal mucosa, and tongue normal  NECK: supple, thyroid normal size, non-tender, without nodularity LYMPH:  no palpable lymphadenopathy in the cervical, axillary or inguinal LUNGS: clear to auscultation and percussion with normal breathing effort HEART: regular rate & rhythm and no murmurs and no lower extremity edema ABDOMEN:abdomen soft, non-tender and normal bowel sounds MUSCULOSKELETAL:no cyanosis of digits and no clubbing  NEURO: alert & oriented x 3 with fluent speech, no focal motor/sensory deficits EXTREMITIES: No lower extremity edema BREAST: Left chest wall mass is smaller. (exam performed in the presence of a chaperone)  LABORATORY DATA:  I have reviewed the data as listed   Chemistry      Component Value Date/Time   NA 138 08/03/2016 0838   K 3.7 08/03/2016 0838   CL 102 06/09/2016 0110   CO2 24 08/03/2016 0838   BUN 10.6 08/03/2016 0838   CREATININE 0.8 08/03/2016 0838      Component Value Date/Time    CALCIUM 9.3 08/03/2016 0838   ALKPHOS 157 (H) 08/03/2016 0838   AST 76 (H) 08/03/2016 0838   ALT 32 08/03/2016 0838   BILITOT 0.60 08/03/2016 0838       Lab Results  Component Value Date   WBC 16.1 (H) 08/03/2016   HGB 9.1 (L) 08/03/2016   HCT 29.4 (L) 08/03/2016   MCV 79.9 08/03/2016   PLT 193 08/03/2016   NEUTROABS 11.3 (H) 08/03/2016    ASSESSMENT & PLAN:  Bilateral breast cancer ([place]) Treated with neoadjuvant chemotherapy followed by surgery 04/08/2014, radiation treatment completed 07/17/2014, antiestrogen therapy with anastrozole 09/14/2014 Left breastT2, N0, M0, IDC grade 3; 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 N1 M0 stage IIB Metastatic diseasediagnosed 05/28/15 (subpectoral masses 3.5 cm and 4.5 cm. Four lung nodules the largest 1.4 cm in the right lung 1.7 cm sclerotic right iliac bone lesion) Left subpectoral mass biopsy 06/21/2015: Invasive high-grade ductal carcinoma ER 5%, PR 0%, HER-2 negative ratio 1.29 Prior treatment:  1. Xeloda 1500 mg by mouth twice a day started 07/07/2015 stopped 09/03/2015 (stopped for progression) 2. Carboplatin day 1 and gemcitabine days 1 and 8 every 3 weeks started 09/10/2015; completed cycle 5 01/20/2016 (carboplatin discontinued for neuropathy) 3. Complete treatment break for 2 months ------------------------------------------------------------------------------------------------------------------------------------------ Current treatment: Carboplatin and gemcitabine (carboplatin added because of progression of disease 04/14/2016), today is cycle 13 day 1, 8 q 3 weeks with neulasta  CT chest 04/06/2016: Interval progression of pre-existing lung nodules new lung metastases (72m, 165m 2.9 cm, 1.6 cm), interval progression of disease in the left breast 4.2 cm (was 2.6 cm),  additional nodules 2.3 cm and 3.2 cm; CT head: scalp mass 1.9 cm  Chemotherapy toxicities: 1. Fatigue 2. Anemia due to chemotherapy: hgb improved mildly to 9.0 3. Severe leukocytosis with neutrophils markedly increased due to Neulasta: Neulasta discontinued , will resume Neulasta because of neutropenia 4. Grade 2 neuropathy  Pain issues: Oxycontin for long acting pain medication. Pain well controlled Insomnia: D/Ced Ativan. Given trazodone Lymphedema: Continue to wear compression sleeves daily. Patient does not use it often Bone mets: Xgeva q 6 weeks with calcium  and Vit D CT chest 06/27/16: Stable lesions in lt breast and deep to Left pectoralis, cystic lesion loc ant in left breast with enh nodular comp which inc compared to previous  Vaginal bleeding: Patient is contemplating on doing a D&C with Dr. Garwin Brothers Scalp cyst could be a sebaceous cyst: I asked the patient to see Dr. Marlou Starks to see if it could be lysed.  Return to clinic in 3 weeks for next cycle  No orders of the defined types were placed in this encounter.  The patient has a good understanding of the overall plan. she agrees with it. she will call with any problems that may develop before the next visit here.   Rulon Eisenmenger, MD 08/03/16

## 2016-08-03 ENCOUNTER — Telehealth: Payer: Self-pay | Admitting: Hematology and Oncology

## 2016-08-03 ENCOUNTER — Other Ambulatory Visit (HOSPITAL_BASED_OUTPATIENT_CLINIC_OR_DEPARTMENT_OTHER): Payer: Medicare Other

## 2016-08-03 ENCOUNTER — Ambulatory Visit: Payer: Medicare Other

## 2016-08-03 ENCOUNTER — Ambulatory Visit (HOSPITAL_BASED_OUTPATIENT_CLINIC_OR_DEPARTMENT_OTHER): Payer: Medicare Other

## 2016-08-03 ENCOUNTER — Ambulatory Visit (HOSPITAL_BASED_OUTPATIENT_CLINIC_OR_DEPARTMENT_OTHER): Payer: Medicare Other | Admitting: Hematology and Oncology

## 2016-08-03 ENCOUNTER — Encounter: Payer: Self-pay | Admitting: Hematology and Oncology

## 2016-08-03 DIAGNOSIS — C7801 Secondary malignant neoplasm of right lung: Secondary | ICD-10-CM

## 2016-08-03 DIAGNOSIS — C50012 Malignant neoplasm of nipple and areola, left female breast: Secondary | ICD-10-CM

## 2016-08-03 DIAGNOSIS — C7951 Secondary malignant neoplasm of bone: Secondary | ICD-10-CM | POA: Diagnosis not present

## 2016-08-03 DIAGNOSIS — C7800 Secondary malignant neoplasm of unspecified lung: Secondary | ICD-10-CM | POA: Diagnosis not present

## 2016-08-03 DIAGNOSIS — C50011 Malignant neoplasm of nipple and areola, right female breast: Secondary | ICD-10-CM

## 2016-08-03 DIAGNOSIS — C50912 Malignant neoplasm of unspecified site of left female breast: Secondary | ICD-10-CM

## 2016-08-03 DIAGNOSIS — C50919 Malignant neoplasm of unspecified site of unspecified female breast: Secondary | ICD-10-CM

## 2016-08-03 DIAGNOSIS — C50911 Malignant neoplasm of unspecified site of right female breast: Secondary | ICD-10-CM

## 2016-08-03 DIAGNOSIS — Z5111 Encounter for antineoplastic chemotherapy: Secondary | ICD-10-CM

## 2016-08-03 DIAGNOSIS — G620 Drug-induced polyneuropathy: Secondary | ICD-10-CM

## 2016-08-03 DIAGNOSIS — Z170 Estrogen receptor positive status [ER+]: Principal | ICD-10-CM

## 2016-08-03 DIAGNOSIS — D72829 Elevated white blood cell count, unspecified: Secondary | ICD-10-CM | POA: Diagnosis not present

## 2016-08-03 LAB — COMPREHENSIVE METABOLIC PANEL
ALBUMIN: 3.4 g/dL — AB (ref 3.5–5.0)
ALK PHOS: 157 U/L — AB (ref 40–150)
ALT: 32 U/L (ref 0–55)
AST: 76 U/L — AB (ref 5–34)
Anion Gap: 9 mEq/L (ref 3–11)
BUN: 10.6 mg/dL (ref 7.0–26.0)
CALCIUM: 9.3 mg/dL (ref 8.4–10.4)
CO2: 24 mEq/L (ref 22–29)
Chloride: 105 mEq/L (ref 98–109)
Creatinine: 0.8 mg/dL (ref 0.6–1.1)
Glucose: 112 mg/dl (ref 70–140)
POTASSIUM: 3.7 meq/L (ref 3.5–5.1)
Sodium: 138 mEq/L (ref 136–145)
Total Bilirubin: 0.6 mg/dL (ref 0.20–1.20)
Total Protein: 7.3 g/dL (ref 6.4–8.3)

## 2016-08-03 LAB — CBC WITH DIFFERENTIAL/PLATELET
BASO%: 0.4 % (ref 0.0–2.0)
BASOS ABS: 0.1 10*3/uL (ref 0.0–0.1)
EOS ABS: 0.4 10*3/uL (ref 0.0–0.5)
EOS%: 2.5 % (ref 0.0–7.0)
HEMATOCRIT: 29.4 % — AB (ref 34.8–46.6)
HEMOGLOBIN: 9.1 g/dL — AB (ref 11.6–15.9)
LYMPH#: 2.5 10*3/uL (ref 0.9–3.3)
LYMPH%: 15.4 % (ref 14.0–49.7)
MCH: 24.7 pg — AB (ref 25.1–34.0)
MCHC: 31 g/dL — ABNORMAL LOW (ref 31.5–36.0)
MCV: 79.9 fL (ref 79.5–101.0)
MONO#: 1.8 10*3/uL — AB (ref 0.1–0.9)
MONO%: 11.3 % (ref 0.0–14.0)
NEUT#: 11.3 10*3/uL — ABNORMAL HIGH (ref 1.5–6.5)
NEUT%: 70.4 % (ref 38.4–76.8)
PLATELETS: 193 10*3/uL (ref 145–400)
RBC: 3.68 10*6/uL — ABNORMAL LOW (ref 3.70–5.45)
RDW: 22 % — ABNORMAL HIGH (ref 11.2–14.5)
WBC: 16.1 10*3/uL — ABNORMAL HIGH (ref 3.9–10.3)
nRBC: 2 % — ABNORMAL HIGH (ref 0–0)

## 2016-08-03 LAB — TECHNOLOGIST REVIEW

## 2016-08-03 MED ORDER — HEPARIN SOD (PORK) LOCK FLUSH 100 UNIT/ML IV SOLN
500.0000 [IU] | Freq: Once | INTRAVENOUS | Status: AC | PRN
  Administered 2016-08-03: 500 [IU]
  Filled 2016-08-03: qty 5

## 2016-08-03 MED ORDER — SODIUM CHLORIDE 0.9 % IV SOLN
550.0000 mg/m2 | Freq: Once | INTRAVENOUS | Status: AC
  Administered 2016-08-03: 912 mg via INTRAVENOUS
  Filled 2016-08-03: qty 23.99

## 2016-08-03 MED ORDER — PALONOSETRON HCL INJECTION 0.25 MG/5ML
INTRAVENOUS | Status: AC
  Filled 2016-08-03: qty 5

## 2016-08-03 MED ORDER — SODIUM CHLORIDE 0.9 % IV SOLN
482.0000 mg | Freq: Once | INTRAVENOUS | Status: AC
  Administered 2016-08-03: 480 mg via INTRAVENOUS
  Filled 2016-08-03: qty 48

## 2016-08-03 MED ORDER — SODIUM CHLORIDE 0.9 % IV SOLN
Freq: Once | INTRAVENOUS | Status: AC
  Administered 2016-08-03: 12:00:00 via INTRAVENOUS

## 2016-08-03 MED ORDER — DENOSUMAB 120 MG/1.7ML ~~LOC~~ SOLN
120.0000 mg | Freq: Once | SUBCUTANEOUS | Status: AC
  Administered 2016-08-03: 120 mg via SUBCUTANEOUS
  Filled 2016-08-03: qty 1.7

## 2016-08-03 MED ORDER — SODIUM CHLORIDE 0.9% FLUSH
10.0000 mL | INTRAVENOUS | Status: DC | PRN
  Administered 2016-08-03: 10 mL
  Filled 2016-08-03: qty 10

## 2016-08-03 MED ORDER — CARBOPLATIN CHEMO INTRADERMAL TEST DOSE 100MCG/0.02ML
100.0000 ug | Freq: Once | INTRADERMAL | Status: AC
  Administered 2016-08-03: 100 ug via INTRADERMAL
  Filled 2016-08-03: qty 0.01

## 2016-08-03 MED ORDER — NYSTATIN 100000 UNIT/ML MT SUSP: 5.0000 mL | Freq: Four times a day (QID) | OROMUCOSAL | 0 refills | Status: DC

## 2016-08-03 MED ORDER — SODIUM CHLORIDE 0.9% FLUSH
10.0000 mL | INTRAVENOUS | Status: DC | PRN
  Administered 2016-08-03: 10 mL via INTRAVENOUS
  Filled 2016-08-03: qty 10

## 2016-08-03 MED ORDER — DEXAMETHASONE SODIUM PHOSPHATE 10 MG/ML IJ SOLN
10.0000 mg | Freq: Once | INTRAMUSCULAR | Status: AC
  Administered 2016-08-03: 10 mg via INTRAVENOUS

## 2016-08-03 MED ORDER — PALONOSETRON HCL INJECTION 0.25 MG/5ML
0.2500 mg | Freq: Once | INTRAVENOUS | Status: AC
  Administered 2016-08-03: 0.25 mg via INTRAVENOUS

## 2016-08-03 MED ORDER — TRAZODONE HCL 50 MG PO TABS: 50.0000 mg | ORAL_TABLET | Freq: Every day | ORAL | 0 refills | Status: DC

## 2016-08-03 MED ORDER — DEXAMETHASONE SODIUM PHOSPHATE 10 MG/ML IJ SOLN
INTRAMUSCULAR | Status: AC
  Filled 2016-08-03: qty 1

## 2016-08-03 MED FILL — NYSTATIN 100,000 UNITS/ML S: 100000 | 3 days supply | Qty: 60 | Fill #0

## 2016-08-03 MED FILL — traZODone HCL 50 MG TABS: 50 | 7 days supply | Qty: 7 | Fill #0

## 2016-08-03 NOTE — Patient Instructions (Signed)
[facility] Discharge Instructions for Patients Receiving Chemotherapy  Today you received the following chemotherapy agents: Gemzar and Carboplatin   To help prevent nausea and vomiting after your treatment, we encourage you to take your nausea medication as directed.    If you develop nausea and vomiting that is not controlled by your nausea medication, call the clinic.   BELOW ARE SYMPTOMS THAT SHOULD BE REPORTED IMMEDIATELY:  *FEVER GREATER THAN 100.5 F  *CHILLS WITH OR WITHOUT FEVER  NAUSEA AND VOMITING THAT IS NOT CONTROLLED WITH YOUR NAUSEA MEDICATION  *UNUSUAL SHORTNESS OF BREATH  *UNUSUAL BRUISING OR BLEEDING  TENDERNESS IN MOUTH AND THROAT WITH OR WITHOUT PRESENCE OF ULCERS  *URINARY PROBLEMS  *BOWEL PROBLEMS  UNUSUAL RASH Items with * indicate a potential emergency and should be followed up as soon as possible.  Feel free to call the clinic you have any questions or concerns. The clinic phone number is [phone].  Please show the CHEMO ALERT CARD at check-in to the Emergency Department and triage nurse.   

## 2016-08-03 NOTE — Patient Instructions (Signed)

## 2016-08-03 NOTE — Telephone Encounter (Signed)
Unable to reach pt to inform of next appt date/time. Phone continues to ring and then cuts off. Letter sent by mail 1/4

## 2016-08-10 ENCOUNTER — Ambulatory Visit: Payer: Medicare Other

## 2016-08-10 ENCOUNTER — Other Ambulatory Visit: Payer: Medicare Other

## 2016-08-11 ENCOUNTER — Ambulatory Visit: Payer: Medicare Other

## 2016-08-14 ENCOUNTER — Ambulatory Visit (HOSPITAL_BASED_OUTPATIENT_CLINIC_OR_DEPARTMENT_OTHER): Payer: Medicare Other | Admitting: Nurse Practitioner

## 2016-08-14 ENCOUNTER — Ambulatory Visit (HOSPITAL_BASED_OUTPATIENT_CLINIC_OR_DEPARTMENT_OTHER): Payer: Medicare Other

## 2016-08-14 ENCOUNTER — Other Ambulatory Visit (HOSPITAL_BASED_OUTPATIENT_CLINIC_OR_DEPARTMENT_OTHER): Payer: Medicare Other

## 2016-08-14 ENCOUNTER — Other Ambulatory Visit: Payer: Self-pay

## 2016-08-14 VITALS — BP 112/70 | HR 65 | Temp 99.0°F | Resp 18

## 2016-08-14 DIAGNOSIS — C50011 Malignant neoplasm of nipple and areola, right female breast: Secondary | ICD-10-CM

## 2016-08-14 DIAGNOSIS — C50511 Malignant neoplasm of lower-outer quadrant of right female breast: Secondary | ICD-10-CM

## 2016-08-14 DIAGNOSIS — Z5111 Encounter for antineoplastic chemotherapy: Secondary | ICD-10-CM | POA: Diagnosis not present

## 2016-08-14 DIAGNOSIS — L988 Other specified disorders of the skin and subcutaneous tissue: Secondary | ICD-10-CM | POA: Diagnosis not present

## 2016-08-14 DIAGNOSIS — C7951 Secondary malignant neoplasm of bone: Secondary | ICD-10-CM | POA: Diagnosis not present

## 2016-08-14 DIAGNOSIS — C50919 Malignant neoplasm of unspecified site of unspecified female breast: Secondary | ICD-10-CM

## 2016-08-14 DIAGNOSIS — C50012 Malignant neoplasm of nipple and areola, left female breast: Secondary | ICD-10-CM | POA: Diagnosis not present

## 2016-08-14 DIAGNOSIS — Z170 Estrogen receptor positive status [ER+]: Principal | ICD-10-CM

## 2016-08-14 DIAGNOSIS — C50911 Malignant neoplasm of unspecified site of right female breast: Secondary | ICD-10-CM

## 2016-08-14 DIAGNOSIS — C7801 Secondary malignant neoplasm of right lung: Secondary | ICD-10-CM

## 2016-08-14 DIAGNOSIS — C50512 Malignant neoplasm of lower-outer quadrant of left female breast: Principal | ICD-10-CM

## 2016-08-14 DIAGNOSIS — C50912 Malignant neoplasm of unspecified site of left female breast: Secondary | ICD-10-CM

## 2016-08-14 LAB — COMPREHENSIVE METABOLIC PANEL
ALBUMIN: 3.2 g/dL — AB (ref 3.5–5.0)
ALK PHOS: 79 U/L (ref 40–150)
ALT: 33 U/L (ref 0–55)
AST: 44 U/L — ABNORMAL HIGH (ref 5–34)
Anion Gap: 11 mEq/L (ref 3–11)
BILIRUBIN TOTAL: 0.4 mg/dL (ref 0.20–1.20)
BUN: 11.7 mg/dL (ref 7.0–26.0)
CO2: 26 mEq/L (ref 22–29)
Calcium: 9.8 mg/dL (ref 8.4–10.4)
Chloride: 103 mEq/L (ref 98–109)
Creatinine: 0.8 mg/dL (ref 0.6–1.1)
GLUCOSE: 160 mg/dL — AB (ref 70–140)
Potassium: 3.6 mEq/L (ref 3.5–5.1)
SODIUM: 139 meq/L (ref 136–145)
TOTAL PROTEIN: 6.9 g/dL (ref 6.4–8.3)

## 2016-08-14 LAB — CBC WITH DIFFERENTIAL/PLATELET
BASO%: 0.4 % (ref 0.0–2.0)
Basophils Absolute: 0 10*3/uL (ref 0.0–0.1)
EOS%: 1.5 % (ref 0.0–7.0)
Eosinophils Absolute: 0.1 10*3/uL (ref 0.0–0.5)
HCT: 26.9 % — ABNORMAL LOW (ref 34.8–46.6)
HEMOGLOBIN: 8.4 g/dL — AB (ref 11.6–15.9)
LYMPH#: 1.9 10*3/uL (ref 0.9–3.3)
LYMPH%: 35.4 % (ref 14.0–49.7)
MCH: 24.6 pg — AB (ref 25.1–34.0)
MCHC: 31.2 g/dL — AB (ref 31.5–36.0)
MCV: 78.9 fL — ABNORMAL LOW (ref 79.5–101.0)
MONO#: 0.8 10*3/uL (ref 0.1–0.9)
MONO%: 14.7 % — ABNORMAL HIGH (ref 0.0–14.0)
NEUT%: 48 % (ref 38.4–76.8)
NEUTROS ABS: 2.6 10*3/uL (ref 1.5–6.5)
Platelets: 94 10*3/uL — ABNORMAL LOW (ref 145–400)
RBC: 3.41 10*6/uL — ABNORMAL LOW (ref 3.70–5.45)
RDW: 18.8 % — ABNORMAL HIGH (ref 11.2–14.5)
WBC: 5.5 10*3/uL (ref 3.9–10.3)
nRBC: 0 % (ref 0–0)

## 2016-08-14 MED ORDER — HEPARIN SOD (PORK) LOCK FLUSH 100 UNIT/ML IV SOLN
500.0000 [IU] | Freq: Once | INTRAVENOUS | Status: AC | PRN
  Administered 2016-08-14: 500 [IU]
  Filled 2016-08-14: qty 5

## 2016-08-14 MED ORDER — PROCHLORPERAZINE MALEATE 10 MG PO TABS
ORAL_TABLET | ORAL | Status: AC
  Filled 2016-08-14: qty 1

## 2016-08-14 MED ORDER — SODIUM CHLORIDE 0.9 % IV SOLN
550.0000 mg/m2 | Freq: Once | INTRAVENOUS | Status: AC
  Administered 2016-08-14: 912 mg via INTRAVENOUS
  Filled 2016-08-14: qty 23.99

## 2016-08-14 MED ORDER — SODIUM CHLORIDE 0.9% FLUSH
10.0000 mL | INTRAVENOUS | Status: DC | PRN
  Administered 2016-08-14: 10 mL
  Filled 2016-08-14: qty 10

## 2016-08-14 MED ORDER — SODIUM CHLORIDE 0.9 % IV SOLN
Freq: Once | INTRAVENOUS | Status: AC
  Administered 2016-08-14: 11:00:00 via INTRAVENOUS

## 2016-08-14 MED ORDER — PROCHLORPERAZINE MALEATE 10 MG PO TABS
10.0000 mg | ORAL_TABLET | Freq: Once | ORAL | Status: AC
  Administered 2016-08-14: 10 mg via ORAL

## 2016-08-14 MED ORDER — OXYCODONE-ACETAMINOPHEN 5-325 MG PO TABS: ORAL_TABLET | ORAL | 0 refills | Status: DC

## 2016-08-14 MED FILL — OXYCODONE W/APAP 5/325 TAB: 5-325 | 15 days supply | Qty: 60 | Fill #0

## 2016-08-14 NOTE — Patient Instructions (Signed)
[facility] Discharge Instructions for Patients Receiving Chemotherapy  Today you received the following chemotherapy agents Gemzar  To help prevent nausea and vomiting after your treatment, we encourage you to take your nausea medication as directed.    If you develop nausea and vomiting that is not controlled by your nausea medication, call the clinic.   BELOW ARE SYMPTOMS THAT SHOULD BE REPORTED IMMEDIATELY:  *FEVER GREATER THAN 100.5 F  *CHILLS WITH OR WITHOUT FEVER  NAUSEA AND VOMITING THAT IS NOT CONTROLLED WITH YOUR NAUSEA MEDICATION  *UNUSUAL SHORTNESS OF BREATH  *UNUSUAL BRUISING OR BLEEDING  TENDERNESS IN MOUTH AND THROAT WITH OR WITHOUT PRESENCE OF ULCERS  *URINARY PROBLEMS  *BOWEL PROBLEMS  UNUSUAL RASH Items with * indicate a potential emergency and should be followed up as soon as possible.  Feel free to call the clinic you have any questions or concerns. The clinic phone number is [phone].  Please show the CHEMO ALERT CARD at check-in to the Emergency Department and triage nurse.   

## 2016-08-14 NOTE — Progress Notes (Signed)
Per Dr. Jana Hakim okay to treat today with platelets of 94.

## 2016-08-14 NOTE — Progress Notes (Signed)
Pt requesting for refill of her percocet. Will refill her script today per Dr.Magrinat.

## 2016-08-15 ENCOUNTER — Encounter: Payer: Self-pay | Admitting: Nurse Practitioner

## 2016-08-15 ENCOUNTER — Ambulatory Visit (HOSPITAL_BASED_OUTPATIENT_CLINIC_OR_DEPARTMENT_OTHER): Payer: Medicare Other

## 2016-08-15 DIAGNOSIS — C50012 Malignant neoplasm of nipple and areola, left female breast: Secondary | ICD-10-CM | POA: Diagnosis not present

## 2016-08-15 DIAGNOSIS — C50011 Malignant neoplasm of nipple and areola, right female breast: Secondary | ICD-10-CM

## 2016-08-15 DIAGNOSIS — Z5111 Encounter for antineoplastic chemotherapy: Secondary | ICD-10-CM

## 2016-08-15 DIAGNOSIS — C50919 Malignant neoplasm of unspecified site of unspecified female breast: Secondary | ICD-10-CM

## 2016-08-15 DIAGNOSIS — Z5189 Encounter for other specified aftercare: Secondary | ICD-10-CM

## 2016-08-15 DIAGNOSIS — C50912 Malignant neoplasm of unspecified site of left female breast: Secondary | ICD-10-CM

## 2016-08-15 DIAGNOSIS — Z170 Estrogen receptor positive status [ER+]: Secondary | ICD-10-CM

## 2016-08-15 DIAGNOSIS — C50911 Malignant neoplasm of unspecified site of right female breast: Secondary | ICD-10-CM

## 2016-08-15 MED ORDER — PEGFILGRASTIM INJECTION 6 MG/0.6ML ~~LOC~~
6.0000 mg | PREFILLED_SYRINGE | Freq: Once | SUBCUTANEOUS | Status: AC
  Administered 2016-08-15: 6 mg via SUBCUTANEOUS
  Filled 2016-08-15: qty 0.6

## 2016-08-15 NOTE — Progress Notes (Signed)
Patient presented to the [facility] today to receive her next cycle of chemotherapy.  She has to speak this provider to review the plan regarding the right scalp lesion.  This provider reviewed all of Dr. Geralyn Flash most recent notes-and also noted that patient underwent a head CT for further evaluation of September 2017.  Per the head CT obtained in September 2017-it is questionable if the lesion to the right scalp is actually a cyst or a malignant mass.  Patient confirmed that she has an appointment with Dr. Marlou Starks general surgeon for this coming Friday, 08/18/2016 for further evaluation and management.  She also has a follow-up visit with Dr. Lindi Adie next week as well.  Brief exam of the right sided scalp lesion with lesion approximately 2-3 cm in diameter and fairly soft.  It doesn't appear to be cellulitic.  It does not appear to be tender.  Patient was advised to call/return or go directly to the emergency department for any worsening symptoms whatsoever.

## 2016-08-24 ENCOUNTER — Encounter: Payer: Self-pay | Admitting: Hematology and Oncology

## 2016-08-24 ENCOUNTER — Ambulatory Visit (HOSPITAL_BASED_OUTPATIENT_CLINIC_OR_DEPARTMENT_OTHER): Payer: Medicare Other

## 2016-08-24 ENCOUNTER — Ambulatory Visit (HOSPITAL_BASED_OUTPATIENT_CLINIC_OR_DEPARTMENT_OTHER): Payer: Medicare Other | Admitting: Hematology and Oncology

## 2016-08-24 ENCOUNTER — Other Ambulatory Visit (HOSPITAL_BASED_OUTPATIENT_CLINIC_OR_DEPARTMENT_OTHER): Payer: Medicare Other

## 2016-08-24 ENCOUNTER — Telehealth: Payer: Self-pay | Admitting: Hematology and Oncology

## 2016-08-24 VITALS — BP 128/79 | HR 79 | Temp 98.4°F | Resp 18 | Wt 131.7 lb

## 2016-08-24 DIAGNOSIS — C50011 Malignant neoplasm of nipple and areola, right female breast: Secondary | ICD-10-CM

## 2016-08-24 DIAGNOSIS — C7951 Secondary malignant neoplasm of bone: Secondary | ICD-10-CM

## 2016-08-24 DIAGNOSIS — C50012 Malignant neoplasm of nipple and areola, left female breast: Secondary | ICD-10-CM

## 2016-08-24 DIAGNOSIS — Z170 Estrogen receptor positive status [ER+]: Principal | ICD-10-CM

## 2016-08-24 DIAGNOSIS — C7801 Secondary malignant neoplasm of right lung: Secondary | ICD-10-CM

## 2016-08-24 DIAGNOSIS — C50919 Malignant neoplasm of unspecified site of unspecified female breast: Secondary | ICD-10-CM

## 2016-08-24 LAB — CBC WITH DIFFERENTIAL/PLATELET
BASO%: 0.7 % (ref 0.0–2.0)
BASOS ABS: 0.1 10*3/uL (ref 0.0–0.1)
EOS ABS: 0.1 10*3/uL (ref 0.0–0.5)
EOS%: 0.7 % (ref 0.0–7.0)
HEMATOCRIT: 29.7 % — AB (ref 34.8–46.6)
HEMOGLOBIN: 9.2 g/dL — AB (ref 11.6–15.9)
LYMPH#: 2 10*3/uL (ref 0.9–3.3)
LYMPH%: 10.1 % — ABNORMAL LOW (ref 14.0–49.7)
MCH: 25 pg — ABNORMAL LOW (ref 25.1–34.0)
MCHC: 31.2 g/dL — ABNORMAL LOW (ref 31.5–36.0)
MCV: 80.3 fL (ref 79.5–101.0)
MONO#: 1.7 10*3/uL — ABNORMAL HIGH (ref 0.1–0.9)
MONO%: 8.8 % (ref 0.0–14.0)
NEUT%: 79.7 % — ABNORMAL HIGH (ref 38.4–76.8)
NEUTROS ABS: 15.8 10*3/uL — AB (ref 1.5–6.5)
Platelets: 117 10*3/uL — ABNORMAL LOW (ref 145–400)
RBC: 3.69 10*6/uL — ABNORMAL LOW (ref 3.70–5.45)
RDW: 20.6 % — AB (ref 11.2–14.5)
WBC: 19.8 10*3/uL — AB (ref 3.9–10.3)

## 2016-08-24 LAB — COMPREHENSIVE METABOLIC PANEL WITH GFR
ALT: 33 U/L (ref 0–55)
AST: 55 U/L — ABNORMAL HIGH (ref 5–34)
Albumin: 3.5 g/dL (ref 3.5–5.0)
Alkaline Phosphatase: 159 U/L — ABNORMAL HIGH (ref 40–150)
Anion Gap: 12 meq/L — ABNORMAL HIGH (ref 3–11)
BUN: 8.7 mg/dL (ref 7.0–26.0)
CO2: 24 meq/L (ref 22–29)
Calcium: 9.6 mg/dL (ref 8.4–10.4)
Chloride: 102 meq/L (ref 98–109)
Creatinine: 0.8 mg/dL (ref 0.6–1.1)
EGFR: >90 mL/min/{1.73_m2}
Glucose: 111 mg/dL (ref 70–140)
Potassium: 3.8 meq/L (ref 3.5–5.1)
Sodium: 138 meq/L (ref 136–145)
Total Bilirubin: 0.51 mg/dL (ref 0.20–1.20)
Total Protein: 7.3 g/dL (ref 6.4–8.3)

## 2016-08-24 MED ORDER — HEPARIN SOD (PORK) LOCK FLUSH 100 UNIT/ML IV SOLN
500.0000 [IU] | Freq: Once | INTRAVENOUS | Status: AC | PRN
  Administered 2016-08-24: 500 [IU] via INTRAVENOUS
  Filled 2016-08-24: qty 5

## 2016-08-24 MED ORDER — SODIUM CHLORIDE 0.9% FLUSH
10.0000 mL | INTRAVENOUS | Status: DC | PRN
  Administered 2016-08-24: 10 mL via INTRAVENOUS
  Filled 2016-08-24: qty 10

## 2016-08-24 NOTE — Progress Notes (Signed)
Patient Care Team: [name], MD as PCP - General (Family Medicine) [name], MD (Cardiology)  DIAGNOSIS:  Encounter Diagnoses  Name Primary?  . Malignant neoplasm metastatic to right lung ([place]) Yes  . Bone metastases ([place])   . 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 N1 M0 stage IIB ER 100%, PR 0%, HER-2       06/17/2014 -  Radiation Therapy    Adjuvant radiation therapy      09/14/2014 -  Anti-estrogen oral therapy    Anastrozole 1 mg daily      05/28/2015 Imaging    CT scans: Enlarging subpectoral masses 3.1 x 3.5 cm, posteriorly lower density mass 4.5 x 2.1 cm, several right-sided lung nodules right lower lobe 1.4 cm, 3 other right lung nodules, 1.7 cm right iliac bone  lesion      06/21/2015 Procedure    Left subpectoral mass biopsy: Invasive high-grade ductal carcinoma ER 5%, PR 0%, HER-2 negative ratio 1.29      09/01/2015 Imaging    Left chest wall mass increased in size 7.2 x 5.1 cm, multiple subcutaneous nodules, increase in the lung nodules both in number as well as in the size of existing nodules      09/10/2015 -  Chemotherapy    Carboplatin, gemcitabine days 1 and 8 q 3 weeks, carboplatin discontinued for neuropathy (treatment break from 01/20/2016 to 03/24/2016); Added Carboplatin back 04/14/16 (for progression)      01/26/2016 Imaging    Marked improvement in size of lung nodules with many of the nodules resolved index right lower lobe nodule 1.9 x 1.8 cm is now 0.7 x 0.6 cm, reduction in the size of left breast mass 7.2 cm down to 2.6 cm, enlargement in the hypodense mass in the uterus      04/06/2016 Imaging    CT chest: Interval progression of pre-existing lung nodules new lung metastases (21m, 115m 2.9 cm, 1.6 cm), interval progression of disease in the left breast 4.2 cm (was 2.6 cm), additional nodules 2.3 cm and 3.2 cm; CT head: scalp mass 1.9 cm      06/27/2016 Imaging    Ct chest:  Stable lesions in lt breast deep to Left pectoralis, cystic lesion loc ant in left breast inc compared to previous; decrease in lung nodules (22m to 3 mm; 12 mm to 7 mm, RML nodule 2.9 cm to 1.8 cm, RLL 158mto 7 mm)       CHIEF COMPLIANT: Follow-up of chemotherapy complaining on file intermittent rectal bleeding, abdominal pain, back pain  INTERVAL HISTORY: Paula Navarro a [id number] old with above-mentioned history metastatic breast cancer who is here today for follow-up. She was supposed to get chemotherapy but she had missed a few treatments and her treatment is currently postponed to next week. She is complaining of occasional rectal bleeding. She also complains of intermittent uterine bleeding. She thinks that the rectal bleeding could be just really  uterine bleeding that she just does not know where it's coming from. She also complains of lower abdominal pain as well as low back pain.  REVIEW OF SYSTEMS:   Constitutional: Denies fevers, chills or abnormal weight loss Eyes: Denies blurriness of vision Ears, nose, mouth, throat, and face: Denies mucositis or sore throat Respiratory: Denies cough, dyspnea or wheezes Cardiovascular: Denies palpitation, chest discomfort Gastrointestinal:  Abdominal pain and back pain Skin: Denies abnormal skin rashes Lymphatics: Denies new lymphadenopathy or easy bruising Neurological:Denies numbness, tingling or new weaknesses Behavioral/Psych: Mood is stable, no new changes  Extremities: No lower extremity edema  All other systems were reviewed with the patient and are negative.  I have reviewed the past medical history, past surgical history, social history and family history with the patient and they are unchanged from previous note.  ALLERGIES:  is allergic to codeine and morphine and related.  MEDICATIONS:  Current Outpatient Prescriptions  Medication Sig Dispense Refill  . B Complex-C (SUPER B COMPLEX PO) Take 1 tablet by mouth daily.    . calcium-vitamin D (OSCAL-500) 500-400 MG-UNIT tablet Take 1 tablet by mouth 2 (two) times daily. 60 tablet 3  . docusate sodium (COLACE) 100 MG capsule Take 1 capsule (100 mg total) by mouth every 12 (twelve) hours. 60 capsule 0  . gabapentin (NEURONTIN) 300 MG capsule TAKE 2 CAPSULES (600 MG TOTAL) BY MOUTH 3 (THREE) TIMES DAILY. 180 capsule 2  . LORazepam (ATIVAN) 1 MG tablet Take 1 tablet (1 mg total) by mouth at bedtime. 30 tablet 0  . nystatin (MYCOSTATIN) 100000 UNIT/ML suspension Take 5 mLs (500,000 Units total) by mouth 4 (four) times daily. 60 mL 0  . ondansetron (ZOFRAN) 4 MG tablet Take 1 tablet (4 mg total) by mouth every 8 (eight) hours as needed for nausea or vomiting. 30 tablet 0  . oxyCODONE-acetaminophen (PERCOCET/ROXICET) 5-325 MG tablet 1  tabs PO q6h prn pain 60 tablet 0  . prochlorperazine (COMPAZINE) 10 MG tablet TAKE 1 TABLET EVERY 6 HOURS AS NEEDED NAUEA OR VOMITING 30 tablet 1  . traZODone (DESYREL) 50 MG tablet Take 1 tablet (50 mg total) by mouth at bedtime. 7 tablet 0  . zolpidem (AMBIEN) 10 MG tablet Take 1 tablet (10 mg total) by mouth at bedtime. 30 tablet 2   No current facility-administered medications for this visit.    Facility-Administered Medications Ordered in Other Visits  Medication Dose Route Frequency Provider Last Rate Last Dose  . sodium chloride flush (NS) 0.9 % injection 10 mL  10 mL Intracatheter PRN ViNicholas LoseMD   10 mL at 03/31/16 1352    PHYSICAL EXAMINATION: ECOG PERFORMANCE STATUS: 1 - Symptomatic but completely ambulatory  Vitals:   08/24/16  1005  BP: 128/79  Pulse: 79  Resp: 18  Temp: 98.4 F (36.9 C)   Filed Weights   08/24/16 1005  Weight: 131 lb 11.2 oz (59.7 kg)    GENERAL:alert, no distress and comfortable SKIN: skin color, texture, turgor are normal, no rashes or significant lesions EYES: normal, Conjunctiva are pink and non-injected, sclera clear OROPHARYNX:no exudate, no erythema and lips, buccal mucosa, and tongue normal  NECK: supple, thyroid normal size, non-tender, without nodularity LYMPH:  no palpable lymphadenopathy in the cervical, axillary or inguinal LUNGS: clear to auscultation and percussion with normal breathing effort HEART: regular rate & rhythm and no murmurs and no lower extremity edema ABDOMEN:abdomen soft, non-tender and normal bowel sounds MUSCULOSKELETAL:no cyanosis of digits and no clubbing  NEURO: alert & oriented x 3 with fluent speech, no focal motor/sensory deficits EXTREMITIES: No lower extremity edema  LABORATORY DATA:  I have reviewed the data as listed   Chemistry      Component Value Date/Time   NA 138 08/24/2016 0916   K 3.8 08/24/2016 0916   CL 102 06/09/2016 0110   CO2 24 08/24/2016 0916   BUN 8.7 08/24/2016 0916    CREATININE 0.8 08/24/2016 0916      Component Value Date/Time   CALCIUM 9.6 08/24/2016 0916   ALKPHOS 159 (H) 08/24/2016 0916   AST 55 (H) 08/24/2016 0916   ALT 33 08/24/2016 0916   BILITOT 0.51 08/24/2016 0916       Lab Results  Component Value Date   WBC 19.8 (H) 08/24/2016   HGB 9.2 (L) 08/24/2016   HCT 29.7 (L) 08/24/2016   MCV 80.3 08/24/2016   PLT 117 (L) 08/24/2016   NEUTROABS 15.8 (H) 08/24/2016    ASSESSMENT & PLAN:  Bilateral breast cancer ([facility]) Treated with neoadjuvant chemotherapy followed by surgery 04/08/2014, radiation treatment completed 07/17/2014, antiestrogen therapy with anastrozole 09/14/2014 Left breastT2, N0, M0, IDC grade 3; 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 N1 M0 stage IIB Metastatic diseasediagnosed 05/28/15 (subpectoral masses 3.5 cm and 4.5 cm. Four lung nodules the largest 1.4 cm in the right lung 1.7 cm sclerotic right iliac bone lesion) Left subpectoral mass biopsy 06/21/2015: Invasive high-grade ductal carcinoma ER 5%, PR 0%, HER-2 negative ratio 1.29 Prior treatment:  1. Xeloda 1500 mg by mouth twice a day started 07/07/2015 stopped 09/03/2015 (stopped for progression) 2. Carboplatin day 1 and gemcitabine days 1 and 8 every 3 weeks started 09/10/2015; completed cycle 5 01/20/2016 (carboplatin discontinued for neuropathy) ------------------------------------------------------------------------------------------------------------------------------------------ Current treatment: Carboplatin and gemcitabine (carboplatin added because of progression of disease 04/14/2016), today is cycle 14 day 1, 8 q 3 weeks with neulasta  Chemotherapy toxicities: 1. Fatigue 2. Anemia due to chemotherapy: hgb improved mildly to 9.0 3. Severe leukocytosis with neutrophils markedly increased due  to Neulasta: Neulasta discontinued , will resume Neulasta because of neutropenia 4. Grade 2 neuropathy  Pain issues: Oxycontin for long acting pain medication. Pain well controlled Insomnia: D/Ced Ativan. Given trazodone Lymphedema: Continue to wear compression sleeves daily. Patient does not use it often Bone mets: Xgeva q 6 weeks with calcium and Vit D CT chest 06/27/16: Stable lesions in lt breast and deep to Left pectoralis, cystic lesion loc ant in left breast with enh nodular comp which inc compared to previous  Vaginal bleeding: Patient is contemplating on doing a  D&C with Dr. Garwin Brothers Scalp cyst could be a sebaceous cyst: I asked the patient to see Dr. Marlou Starks to see if it could be lysed.  Return to clinic in 3 weeks for next cycle Scans will be obtained prior to the next cycle.  I spent 25 minutes talking to the patient of which more than half was spent in counseling and coordination of care.  Orders Placed This Encounter  Procedures  . CT Abdomen Pelvis W Contrast    Standing Status:   Future    Standing Expiration Date:   08/24/2017    Order Specific Question:   If indicated for the ordered procedure, I authorize the administration of contrast media per Radiology protocol    Answer:   Yes    Order Specific Question:   Reason for Exam (SYMPTOM  OR DIAGNOSIS REQUIRED)    Answer:   Metastatic breast cancer restaging    Order Specific Question:   Preferred imaging location?    Answer:   [facility]  . CT Chest W Contrast    Standing Status:   Future    Standing Expiration Date:   08/24/2017    Order Specific Question:   If indicated for the ordered procedure, I authorize the administration of contrast media per Radiology protocol    Answer:   Yes    Order Specific Question:   Reason for Exam (SYMPTOM  OR DIAGNOSIS REQUIRED)    Answer:   Metastatic breast cancer restaging    Order Specific Question:   Preferred imaging location?    Answer:   [facility]    Standing Status:   Future    Standing Expiration Date:   08/24/2017    Order Specific Question:   Reason for Exam (SYMPTOM  OR DIAGNOSIS REQUIRED)    Answer:   Metastatic breast cancer restaging    Order Specific Question:   Preferred imaging location?    Answer:   [facility]    Order Specific Question:   If indicated for the ordered procedure, I authorize the administration of a radiopharmaceutical per Radiology protocol    Answer:   Yes   The patient has a good understanding of the overall plan. she agrees with it. she will call with any problems that may develop before the next visit here.   Rulon Eisenmenger, MD 08/24/16

## 2016-08-24 NOTE — Telephone Encounter (Signed)
Unable to reach pt to confirm 2/1 appt per LOS. Called pt number several times and phone just rings then give a busy signal. Letter sent by mail 1/25

## 2016-08-24 NOTE — Assessment & Plan Note (Signed)
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 N1 M0 stage IIB Metastatic diseasediagnosed 05/28/15 (subpectoral masses 3.5 cm and 4.5 cm. Four lung nodules the largest 1.4 cm in the right lung 1.7 cm sclerotic right iliac bone lesion) Left subpectoral mass biopsy 06/21/2015: Invasive high-grade ductal carcinoma ER 5%, PR 0%, HER-2 negative ratio 1.29 Prior treatment:  1. Xeloda 1500 mg by mouth twice a day started 07/07/2015 stopped 09/03/2015 (stopped for progression) 2. Carboplatin day 1 and gemcitabine days 1 and 8 every 3 weeks started 09/10/2015; completed cycle 5 01/20/2016 (carboplatin discontinued for neuropathy) ------------------------------------------------------------------------------------------------------------------------------------------ Current treatment: Carboplatin and gemcitabine (carboplatin added because of progression of disease 04/14/2016), today is cycle 14 day 1, 8 q 3 weeks with neulasta  Chemotherapy toxicities: 1. Fatigue 2. Anemia due to chemotherapy: hgb improved mildly to 9.0 3. Severe leukocytosis with neutrophils markedly increased due to Neulasta: Neulasta discontinued , will resume Neulasta because of neutropenia 4. Grade 2 neuropathy  Pain issues: Oxycontin for long acting pain medication. Pain well controlled Insomnia: D/Ced Ativan. Given trazodone Lymphedema: Continue to wear compression sleeves daily. Patient does not use it often Bone mets: Xgeva q 6 weeks with calcium and Vit D CT chest 06/27/16: Stable lesions in lt breast and deep to Left pectoralis, cystic lesion loc ant in left breast with  enh nodular comp which inc compared to previous  Vaginal bleeding: Patient is contemplating on doing a D&C with Dr. Garwin Brothers Scalp cyst could be a sebaceous cyst: I asked the patient to see Dr. Marlou Starks to see if it could be lysed.  Return to clinic in 3 weeks for next cycle

## 2016-08-25 NOTE — Progress Notes (Signed)
Re consult  Metastatic breat Cancer, now Left chest wall mass   Radiation B/L Breast from 06/30/14-08/17/14 Dr. Thea Silversmith  Dr. Lindi Adie MD last office visit 08/24/2016: Prior treatment:  1. Xeloda 1500 mg by mouth twice a day started 07/07/2015 stopped 09/03/2015 (stopped for progression) 2. Carboplatin day 1 and gemcitabine days 1 and 8 every 3 weeks started 09/10/2015; completed cycle 5 01/20/2016 (carboplatin discontinued for neuropathy) ------------------------------------------------------------------------------------------------------------------------------------------ Current treatment: Carboplatin and gemcitabine (carboplatin added because of progression of disease 04/14/2016), today is cycle 14day 1, 8q 3 weeks with neulasta  Chemotherapy toxicities: 1. Fatigue 2. Anemia due to chemotherapy: hgb improved mildly to 9.0 3. Severe leukocytosis with neutrophils markedly increased due to Neulasta: Neulasta discontinued , will resume Neulasta because of neutropenia 4. Grade 2 neuropathy  Pain issues: Oxycontin for long acting pain medication. Pain well controlled Insomnia: D/Ced Ativan. Given trazodone Lymphedema: Continue to wear compression sleeves daily. Patient does not use it often Bone mets: Xgeva q 6 weeks with calcium and Vit D CT chest 06/27/16: Stable lesions in lt breast and deep to Left pectoralis, cystic lesion loc ant in left breast with enh nodular comp which inc compared to previous  Vaginal bleeding: Patient is contemplating on doing a D&C with Dr. Garwin Brothers Scalp cyst could be a sebaceous cyst: I asked the patient to see Dr. Marlou Starks to see if it could be lysed. Bone scan and Ct chest/abd/pelvis 09/13/16  Return to clinic in 3 weeks for next cycle 09/21/16

## 2016-08-28 ENCOUNTER — Ambulatory Visit: Payer: Medicare Other | Admitting: Radiation Oncology

## 2016-08-28 ENCOUNTER — Ambulatory Visit
Admission: RE | Admit: 2016-08-28 | Discharge: 2016-08-28 | Disposition: A | Discharge status: Home / Self Care | Payer: Medicare Other | Source: Ambulatory Visit | Attending: Radiation Oncology | Admitting: Radiation Oncology

## 2016-08-28 ENCOUNTER — Ambulatory Visit: Payer: Medicare Other

## 2016-08-29 ENCOUNTER — Other Ambulatory Visit: Payer: Self-pay | Admitting: Hematology and Oncology

## 2016-08-29 ENCOUNTER — Telehealth: Payer: Self-pay | Admitting: *Deleted

## 2016-08-29 DIAGNOSIS — C50111 Malignant neoplasm of central portion of right female breast: Secondary | ICD-10-CM

## 2016-08-29 DIAGNOSIS — C50112 Malignant neoplasm of central portion of left female breast: Principal | ICD-10-CM

## 2016-08-29 NOTE — Telephone Encounter (Signed)
Patient called and moved her appts from this Thursday to the following Monday. Patient aware of the appt date/time

## 2016-08-30 ENCOUNTER — Ambulatory Visit: Payer: Self-pay | Admitting: General Surgery

## 2016-08-31 ENCOUNTER — Other Ambulatory Visit: Payer: Medicare Other

## 2016-08-31 ENCOUNTER — Ambulatory Visit: Payer: Medicare Other

## 2016-09-01 ENCOUNTER — Ambulatory Visit: Payer: Medicare Other

## 2016-09-01 ENCOUNTER — Other Ambulatory Visit: Payer: Self-pay | Admitting: *Deleted

## 2016-09-01 DIAGNOSIS — C50919 Malignant neoplasm of unspecified site of unspecified female breast: Secondary | ICD-10-CM

## 2016-09-04 ENCOUNTER — Ambulatory Visit: Payer: Medicare Other

## 2016-09-04 ENCOUNTER — Telehealth: Payer: Self-pay | Admitting: Emergency Medicine

## 2016-09-04 ENCOUNTER — Other Ambulatory Visit: Payer: Medicare Other

## 2016-09-04 NOTE — Telephone Encounter (Signed)
Patient called to cancel her appointments for today 09/04/16; she states that she was in the ED with her mother all last night and that she has been admitted to the ICU. States that she needs to be with her mother while she's in the hospital right now. Advised patient that she is scheduled for chemo next Monday 2/12; she states that hopefully she can come then. Dr Lindi Adie aware.

## 2016-09-06 ENCOUNTER — Ambulatory Visit: Payer: Medicare Other

## 2016-09-07 ENCOUNTER — Other Ambulatory Visit: Payer: Medicare Other

## 2016-09-07 ENCOUNTER — Ambulatory Visit: Payer: Medicare Other | Admitting: Hematology and Oncology

## 2016-09-07 ENCOUNTER — Ambulatory Visit: Payer: Medicare Other

## 2016-09-08 ENCOUNTER — Other Ambulatory Visit: Payer: Self-pay | Admitting: Hematology and Oncology

## 2016-09-11 ENCOUNTER — Ambulatory Visit: Payer: Medicare Other

## 2016-09-11 ENCOUNTER — Other Ambulatory Visit: Payer: Self-pay | Admitting: Hematology and Oncology

## 2016-09-11 ENCOUNTER — Ambulatory Visit (HOSPITAL_BASED_OUTPATIENT_CLINIC_OR_DEPARTMENT_OTHER): Payer: Medicare Other

## 2016-09-11 ENCOUNTER — Other Ambulatory Visit (HOSPITAL_BASED_OUTPATIENT_CLINIC_OR_DEPARTMENT_OTHER): Payer: Medicare Other

## 2016-09-11 VITALS — BP 126/75 | HR 78 | Temp 98.9°F | Resp 18

## 2016-09-11 DIAGNOSIS — C50012 Malignant neoplasm of nipple and areola, left female breast: Secondary | ICD-10-CM | POA: Diagnosis not present

## 2016-09-11 DIAGNOSIS — C50011 Malignant neoplasm of nipple and areola, right female breast: Secondary | ICD-10-CM

## 2016-09-11 DIAGNOSIS — C7801 Secondary malignant neoplasm of right lung: Secondary | ICD-10-CM | POA: Diagnosis not present

## 2016-09-11 DIAGNOSIS — Z170 Estrogen receptor positive status [ER+]: Secondary | ICD-10-CM

## 2016-09-11 DIAGNOSIS — Z5111 Encounter for antineoplastic chemotherapy: Secondary | ICD-10-CM | POA: Diagnosis not present

## 2016-09-11 DIAGNOSIS — C7951 Secondary malignant neoplasm of bone: Secondary | ICD-10-CM

## 2016-09-11 DIAGNOSIS — C50919 Malignant neoplasm of unspecified site of unspecified female breast: Secondary | ICD-10-CM

## 2016-09-11 DIAGNOSIS — C50912 Malignant neoplasm of unspecified site of left female breast: Secondary | ICD-10-CM

## 2016-09-11 DIAGNOSIS — C50911 Malignant neoplasm of unspecified site of right female breast: Secondary | ICD-10-CM

## 2016-09-11 LAB — CBC WITH DIFFERENTIAL/PLATELET
BASO%: 0.7 % (ref 0.0–2.0)
Basophils Absolute: 0.1 10*3/uL (ref 0.0–0.1)
EOS%: 1.7 % (ref 0.0–7.0)
Eosinophils Absolute: 0.2 10*3/uL (ref 0.0–0.5)
HCT: 31 % — ABNORMAL LOW (ref 34.8–46.6)
HGB: 9.8 g/dL — ABNORMAL LOW (ref 11.6–15.9)
LYMPH%: 20.2 % (ref 14.0–49.7)
MCH: 24.3 pg — ABNORMAL LOW (ref 25.1–34.0)
MCHC: 31.5 g/dL (ref 31.5–36.0)
MCV: 77.2 fL — AB (ref 79.5–101.0)
MONO#: 0.9 10*3/uL (ref 0.1–0.9)
MONO%: 8.9 % (ref 0.0–14.0)
NEUT#: 7.2 10*3/uL — ABNORMAL HIGH (ref 1.5–6.5)
NEUT%: 68.5 % (ref 38.4–76.8)
Platelets: 302 10*3/uL (ref 145–400)
RBC: 4.02 10*6/uL (ref 3.70–5.45)
RDW: 17.1 % — ABNORMAL HIGH (ref 11.2–14.5)
WBC: 10.5 10*3/uL — ABNORMAL HIGH (ref 3.9–10.3)
lymph#: 2.1 10*3/uL (ref 0.9–3.3)

## 2016-09-11 LAB — COMPREHENSIVE METABOLIC PANEL
ALT: 25 U/L (ref 0–55)
ANION GAP: 8 meq/L (ref 3–11)
AST: 48 U/L — ABNORMAL HIGH (ref 5–34)
Albumin: 3.2 g/dL — ABNORMAL LOW (ref 3.5–5.0)
Alkaline Phosphatase: 70 U/L (ref 40–150)
BUN: 9.2 mg/dL (ref 7.0–26.0)
CHLORIDE: 102 meq/L (ref 98–109)
CO2: 29 meq/L (ref 22–29)
Calcium: 10.2 mg/dL (ref 8.4–10.4)
Creatinine: 0.8 mg/dL (ref 0.6–1.1)
Glucose: 110 mg/dl (ref 70–140)
Potassium: 3.8 mEq/L (ref 3.5–5.1)
Sodium: 139 mEq/L (ref 136–145)
Total Bilirubin: 0.86 mg/dL (ref 0.20–1.20)
Total Protein: 7.4 g/dL (ref 6.4–8.3)

## 2016-09-11 MED ORDER — DEXAMETHASONE SODIUM PHOSPHATE 10 MG/ML IJ SOLN
INTRAMUSCULAR | Status: AC
  Filled 2016-09-11: qty 1

## 2016-09-11 MED ORDER — SODIUM CHLORIDE 0.9 % IV SOLN
550.0000 mg/m2 | Freq: Once | INTRAVENOUS | Status: AC
  Administered 2016-09-11: 912 mg via INTRAVENOUS
  Filled 2016-09-11: qty 23.99

## 2016-09-11 MED ORDER — PALONOSETRON HCL INJECTION 0.25 MG/5ML
0.2500 mg | Freq: Once | INTRAVENOUS | Status: AC
  Administered 2016-09-11: 0.25 mg via INTRAVENOUS

## 2016-09-11 MED ORDER — SODIUM CHLORIDE 0.9 % IV SOLN
482.0000 mg | Freq: Once | INTRAVENOUS | Status: AC
  Administered 2016-09-11: 480 mg via INTRAVENOUS
  Filled 2016-09-11: qty 48

## 2016-09-11 MED ORDER — SODIUM CHLORIDE 0.9% FLUSH
10.0000 mL | INTRAVENOUS | Status: DC | PRN
  Administered 2016-09-11: 10 mL
  Filled 2016-09-11: qty 10

## 2016-09-11 MED ORDER — DENOSUMAB 120 MG/1.7ML ~~LOC~~ SOLN
120.0000 mg | Freq: Once | SUBCUTANEOUS | Status: AC
Start: 2016-09-11 — End: 2016-09-11
  Administered 2016-09-11: 120 mg via SUBCUTANEOUS
  Filled 2016-09-11: qty 1.7

## 2016-09-11 MED ORDER — CARBOPLATIN CHEMO INTRADERMAL TEST DOSE 100MCG/0.02ML
100.0000 ug | Freq: Once | INTRADERMAL | Status: AC
  Administered 2016-09-11: 100 ug via INTRADERMAL
  Filled 2016-09-11: qty 0.02

## 2016-09-11 MED ORDER — DEXAMETHASONE SODIUM PHOSPHATE 10 MG/ML IJ SOLN
10.0000 mg | Freq: Once | INTRAMUSCULAR | Status: AC
Start: 2016-09-11 — End: 2016-09-11
  Administered 2016-09-11: 10 mg via INTRAVENOUS

## 2016-09-11 MED ORDER — SODIUM CHLORIDE 0.9% FLUSH
10.0000 mL | INTRAVENOUS | Status: DC | PRN
  Administered 2016-09-11: 10 mL via INTRAVENOUS
  Filled 2016-09-11: qty 10

## 2016-09-11 MED ORDER — PALONOSETRON HCL INJECTION 0.25 MG/5ML
INTRAVENOUS | Status: AC
  Filled 2016-09-11: qty 5

## 2016-09-11 MED ORDER — HEPARIN SOD (PORK) LOCK FLUSH 100 UNIT/ML IV SOLN
500.0000 [IU] | Freq: Once | INTRAVENOUS | Status: AC | PRN
  Administered 2016-09-11: 500 [IU]
  Filled 2016-09-11: qty 5

## 2016-09-11 MED ORDER — SODIUM CHLORIDE 0.9 % IV SOLN
Freq: Once | INTRAVENOUS | Status: AC
  Administered 2016-09-11: 13:00:00 via INTRAVENOUS

## 2016-09-11 NOTE — Patient Instructions (Signed)
[facility] Discharge Instructions for Patients Receiving Chemotherapy  Today you received the following chemotherapy agents: Gemzar and Carboplatin   To help prevent nausea and vomiting after your treatment, we encourage you to take your nausea medication as directed.    If you develop nausea and vomiting that is not controlled by your nausea medication, call the clinic.   BELOW ARE SYMPTOMS THAT SHOULD BE REPORTED IMMEDIATELY:  *FEVER GREATER THAN 100.5 F  *CHILLS WITH OR WITHOUT FEVER  NAUSEA AND VOMITING THAT IS NOT CONTROLLED WITH YOUR NAUSEA MEDICATION  *UNUSUAL SHORTNESS OF BREATH  *UNUSUAL BRUISING OR BLEEDING  TENDERNESS IN MOUTH AND THROAT WITH OR WITHOUT PRESENCE OF ULCERS  *URINARY PROBLEMS  *BOWEL PROBLEMS  UNUSUAL RASH Items with * indicate a potential emergency and should be followed up as soon as possible.  Feel free to call the clinic you have any questions or concerns. The clinic phone number is [phone].  Please show the CHEMO ALERT CARD at check-in to the Emergency Department and triage nurse.   

## 2016-09-11 NOTE — Progress Notes (Signed)
Per Dr Lindi Adie ok to keep Neulasta appointment for 09/19/16. No new orders.

## 2016-09-13 ENCOUNTER — Ambulatory Visit: Payer: Medicare Other

## 2016-09-13 ENCOUNTER — Ambulatory Visit (HOSPITAL_COMMUNITY): Payer: Medicare Other

## 2016-09-13 ENCOUNTER — Other Ambulatory Visit (HOSPITAL_COMMUNITY): Payer: Medicare Other

## 2016-09-15 ENCOUNTER — Other Ambulatory Visit: Payer: Self-pay | Admitting: *Deleted

## 2016-09-15 DIAGNOSIS — C50919 Malignant neoplasm of unspecified site of unspecified female breast: Secondary | ICD-10-CM

## 2016-09-15 DIAGNOSIS — C50011 Malignant neoplasm of nipple and areola, right female breast: Secondary | ICD-10-CM

## 2016-09-15 DIAGNOSIS — C50012 Malignant neoplasm of nipple and areola, left female breast: Principal | ICD-10-CM

## 2016-09-18 ENCOUNTER — Telehealth: Payer: Self-pay | Admitting: *Deleted

## 2016-09-18 ENCOUNTER — Ambulatory Visit: Payer: Medicare Other

## 2016-09-18 ENCOUNTER — Other Ambulatory Visit: Payer: Medicare Other

## 2016-09-18 NOTE — Telephone Encounter (Signed)
Per patient request I have moved appts to tomorrow. Patient aware

## 2016-09-19 ENCOUNTER — Ambulatory Visit: Payer: Medicare Other

## 2016-09-19 ENCOUNTER — Telehealth: Payer: Self-pay

## 2016-09-19 ENCOUNTER — Other Ambulatory Visit (HOSPITAL_BASED_OUTPATIENT_CLINIC_OR_DEPARTMENT_OTHER): Payer: Medicare Other

## 2016-09-19 ENCOUNTER — Other Ambulatory Visit: Payer: Self-pay

## 2016-09-19 ENCOUNTER — Ambulatory Visit (HOSPITAL_BASED_OUTPATIENT_CLINIC_OR_DEPARTMENT_OTHER): Payer: Medicare Other

## 2016-09-19 VITALS — BP 117/71 | HR 73 | Temp 98.3°F | Resp 16

## 2016-09-19 DIAGNOSIS — C50919 Malignant neoplasm of unspecified site of unspecified female breast: Secondary | ICD-10-CM

## 2016-09-19 DIAGNOSIS — C50512 Malignant neoplasm of lower-outer quadrant of left female breast: Principal | ICD-10-CM

## 2016-09-19 DIAGNOSIS — C50012 Malignant neoplasm of nipple and areola, left female breast: Secondary | ICD-10-CM

## 2016-09-19 DIAGNOSIS — C7801 Secondary malignant neoplasm of right lung: Secondary | ICD-10-CM | POA: Diagnosis not present

## 2016-09-19 DIAGNOSIS — C50912 Malignant neoplasm of unspecified site of left female breast: Secondary | ICD-10-CM

## 2016-09-19 DIAGNOSIS — Z170 Estrogen receptor positive status [ER+]: Secondary | ICD-10-CM

## 2016-09-19 DIAGNOSIS — C7951 Secondary malignant neoplasm of bone: Secondary | ICD-10-CM

## 2016-09-19 DIAGNOSIS — Z5111 Encounter for antineoplastic chemotherapy: Secondary | ICD-10-CM | POA: Diagnosis not present

## 2016-09-19 DIAGNOSIS — C50511 Malignant neoplasm of lower-outer quadrant of right female breast: Secondary | ICD-10-CM

## 2016-09-19 DIAGNOSIS — C50911 Malignant neoplasm of unspecified site of right female breast: Secondary | ICD-10-CM

## 2016-09-19 DIAGNOSIS — C50011 Malignant neoplasm of nipple and areola, right female breast: Secondary | ICD-10-CM

## 2016-09-19 LAB — COMPREHENSIVE METABOLIC PANEL
ALBUMIN: 3.2 g/dL — AB (ref 3.5–5.0)
ALK PHOS: 61 U/L (ref 40–150)
ALT: 50 U/L (ref 0–55)
AST: 65 U/L — ABNORMAL HIGH (ref 5–34)
Anion Gap: 10 mEq/L (ref 3–11)
BILIRUBIN TOTAL: 0.45 mg/dL (ref 0.20–1.20)
BUN: 9.7 mg/dL (ref 7.0–26.0)
CO2: 25 meq/L (ref 22–29)
CREATININE: 0.7 mg/dL (ref 0.6–1.1)
Calcium: 9.5 mg/dL (ref 8.4–10.4)
Chloride: 104 mEq/L (ref 98–109)
EGFR: >90 mL/min/{1.73_m2} (ref >90)
GLUCOSE: 88 mg/dL (ref 70–140)
Potassium: 3.4 mEq/L — ABNORMAL LOW (ref 3.5–5.1)
SODIUM: 139 meq/L (ref 136–145)
TOTAL PROTEIN: 7.1 g/dL (ref 6.4–8.3)

## 2016-09-19 LAB — CBC WITH DIFFERENTIAL/PLATELET
BASO%: 0.7 % (ref 0.0–2.0)
Basophils Absolute: 0 10*3/uL (ref 0.0–0.1)
EOS%: 1.3 % (ref 0.0–7.0)
Eosinophils Absolute: 0.1 10*3/uL (ref 0.0–0.5)
HCT: 28.3 % — ABNORMAL LOW (ref 34.8–46.6)
HEMOGLOBIN: 9 g/dL — AB (ref 11.6–15.9)
LYMPH%: 37.1 % (ref 14.0–49.7)
MCH: 24.3 pg — ABNORMAL LOW (ref 25.1–34.0)
MCHC: 31.7 g/dL (ref 31.5–36.0)
MCV: 76.7 fL — ABNORMAL LOW (ref 79.5–101.0)
MONO#: 0.4 10*3/uL (ref 0.1–0.9)
MONO%: 8.3 % (ref 0.0–14.0)
NEUT%: 52.6 % (ref 38.4–76.8)
NEUTROS ABS: 2.7 10*3/uL (ref 1.5–6.5)
PLATELETS: 141 10*3/uL — AB (ref 145–400)
RBC: 3.69 10*6/uL — AB (ref 3.70–5.45)
RDW: 16.7 % — AB (ref 11.2–14.5)
WBC: 5.2 10*3/uL (ref 3.9–10.3)
lymph#: 1.9 10*3/uL (ref 0.9–3.3)

## 2016-09-19 MED ORDER — SODIUM CHLORIDE 0.9 % IV SOLN
550.0000 mg/m2 | Freq: Once | INTRAVENOUS | Status: AC
  Administered 2016-09-19: 912 mg via INTRAVENOUS
  Filled 2016-09-19: qty 23.99

## 2016-09-19 MED ORDER — HEPARIN SOD (PORK) LOCK FLUSH 100 UNIT/ML IV SOLN
500.0000 [IU] | Freq: Once | INTRAVENOUS | Status: AC | PRN
  Administered 2016-09-19: 500 [IU]
  Filled 2016-09-19: qty 5

## 2016-09-19 MED ORDER — SODIUM CHLORIDE 0.9% FLUSH
10.0000 mL | INTRAVENOUS | Status: DC | PRN
  Administered 2016-09-19: 10 mL
  Filled 2016-09-19: qty 10

## 2016-09-19 MED ORDER — SODIUM CHLORIDE 0.9 % IV SOLN
Freq: Once | INTRAVENOUS | Status: AC
  Administered 2016-09-19: 13:00:00 via INTRAVENOUS

## 2016-09-19 MED ORDER — PROCHLORPERAZINE MALEATE 10 MG PO TABS
10.0000 mg | ORAL_TABLET | Freq: Once | ORAL | Status: AC
  Administered 2016-09-19: 10 mg via ORAL

## 2016-09-19 MED ORDER — OXYCODONE-ACETAMINOPHEN 5-325 MG PO TABS: ORAL_TABLET | ORAL | 0 refills | Status: DC

## 2016-09-19 MED ORDER — PROCHLORPERAZINE MALEATE 10 MG PO TABS
ORAL_TABLET | ORAL | Status: AC
  Filled 2016-09-19: qty 1

## 2016-09-19 MED FILL — OXYCODONE/APAP 5/325 MG TAB: 5-325 | 15 days supply | Qty: 60 | Fill #0

## 2016-09-19 NOTE — Telephone Encounter (Signed)
Pt called to request a refill for her pain medication. Pt will be in today around lunch time. Told pt that it will be ready for pick up.

## 2016-09-20 ENCOUNTER — Ambulatory Visit (HOSPITAL_COMMUNITY)
Admission: RE | Admit: 2016-09-20 | Discharge: 2016-09-20 | Disposition: A | Discharge status: Home / Self Care | Payer: Medicare Other | Source: Ambulatory Visit | Attending: Hematology and Oncology | Admitting: Hematology and Oncology

## 2016-09-20 ENCOUNTER — Encounter (HOSPITAL_COMMUNITY)
Admission: RE | Admit: 2016-09-20 | Discharge: 2016-09-20 | Disposition: A | Discharge status: Home / Self Care | Payer: Medicare Other | Source: Ambulatory Visit | Attending: Hematology and Oncology | Admitting: Hematology and Oncology

## 2016-09-20 ENCOUNTER — Other Ambulatory Visit (HOSPITAL_COMMUNITY): Payer: Medicare Other

## 2016-09-20 ENCOUNTER — Encounter (HOSPITAL_COMMUNITY): Payer: Medicare Other

## 2016-09-20 ENCOUNTER — Encounter (HOSPITAL_COMMUNITY): Payer: Self-pay

## 2016-09-20 DIAGNOSIS — C7801 Secondary malignant neoplasm of right lung: Secondary | ICD-10-CM

## 2016-09-20 DIAGNOSIS — C50011 Malignant neoplasm of nipple and areola, right female breast: Secondary | ICD-10-CM | POA: Insufficient documentation

## 2016-09-20 DIAGNOSIS — C50012 Malignant neoplasm of nipple and areola, left female breast: Secondary | ICD-10-CM | POA: Insufficient documentation

## 2016-09-20 DIAGNOSIS — C7951 Secondary malignant neoplasm of bone: Secondary | ICD-10-CM | POA: Insufficient documentation

## 2016-09-20 MED ORDER — TECHNETIUM TC 99M MEDRONATE IV KIT
25.0000 | PACK | Freq: Once | INTRAVENOUS | Status: AC | PRN
  Administered 2016-09-20: 21.5 via INTRAVENOUS

## 2016-09-21 ENCOUNTER — Encounter: Payer: Self-pay | Admitting: Hematology and Oncology

## 2016-09-21 ENCOUNTER — Ambulatory Visit (HOSPITAL_BASED_OUTPATIENT_CLINIC_OR_DEPARTMENT_OTHER): Payer: Medicare Other | Admitting: Hematology and Oncology

## 2016-09-21 ENCOUNTER — Ambulatory Visit: Payer: Medicare Other

## 2016-09-21 ENCOUNTER — Other Ambulatory Visit: Payer: Medicare Other

## 2016-09-21 VITALS — BP 128/72 | HR 72 | Temp 98.7°F | Resp 17 | Wt 127.4 lb

## 2016-09-21 DIAGNOSIS — C7951 Secondary malignant neoplasm of bone: Secondary | ICD-10-CM | POA: Diagnosis not present

## 2016-09-21 DIAGNOSIS — C50012 Malignant neoplasm of nipple and areola, left female breast: Secondary | ICD-10-CM | POA: Diagnosis not present

## 2016-09-21 DIAGNOSIS — C7801 Secondary malignant neoplasm of right lung: Secondary | ICD-10-CM

## 2016-09-21 DIAGNOSIS — D6481 Anemia due to antineoplastic chemotherapy: Secondary | ICD-10-CM

## 2016-09-21 DIAGNOSIS — C50011 Malignant neoplasm of nipple and areola, right female breast: Secondary | ICD-10-CM | POA: Diagnosis not present

## 2016-09-21 NOTE — Assessment & Plan Note (Signed)
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 N1 M0 stage IIB Metastatic diseasediagnosed 05/28/15 (subpectoral masses 3.5 cm and 4.5 cm. Four lung nodules the largest 1.4 cm in the right lung 1.7 cm sclerotic right iliac bone lesion) Left subpectoral mass biopsy 06/21/2015: Invasive high-grade ductal carcinoma ER 5%, PR 0%, HER-2 negative ratio 1.29 Prior treatment:  1. Xeloda 1500 mg by mouth twice a day started 07/07/2015 stopped 09/03/2015 (stopped for progression) 2. Carboplatin day 1 and gemcitabine days 1 and 8 every 3 weeks started 09/10/2015; completed cycle 5 01/20/2016 (carboplatin discontinued for neuropathy) ------------------------------------------------------------------------------------------------------------------------------------------ Current treatment: Carboplatin and gemcitabine (carboplatin added because of progression of disease 04/14/2016), today is cycle 14day 1, 8q 3 weeks with neulasta  Chemotherapy toxicities: 1. Fatigue 2. Anemia due to chemotherapy: hgb improved mildly to 9.0 3. Severe leukocytosis with neutrophils markedly increased due to Neulasta: Neulasta discontinued , will resume Neulasta because of neutropenia 4. Grade 2 neuropathy  Pain issues: Oxycontin for long acting pain medication. Pain well controlled Insomnia: D/Ced Ativan. Given trazodone Lymphedema: Continue to wear compression sleeves daily. Patient does not use it often Bone mets: Xgeva q 6 weeks with calcium and Vit D CT chest 06/27/16: Stable lesions in lt breast and deep to Left pectoralis, cystic lesion loc ant in left breast with  enh nodular comp which inc compared to previous  Vaginal bleeding: Patient is contemplating on doing a D&C with Dr. Garwin Brothers Scalp cyst could be a sebaceous cyst: Dr. Marlou Starks is planning on surgery on the cyst. We will adjust her treatment so that she can get the surgery done.  Bone scan 09/20/2016: Asymmetric uptake at the level of the tip of the left scapula or within an adjacent rib. No evidence of active bone metastases elsewhere. CT chest abdomen pelvis are pending  Return to clinic in 3 weeks for next cycle

## 2016-09-21 NOTE — Progress Notes (Signed)
Spoke with Dr. Lindi Adie nurse (May, Rn) that pt is not due any injections today

## 2016-09-21 NOTE — Progress Notes (Signed)
Patient Care Team: Paula Donna, MD as PCP - General (Family Medicine) Paula Savannah, MD (Cardiology)  DIAGNOSIS:  Encounter Diagnoses  Name Primary?  . 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) Yes  . Bone metastases ([place])     SUMMARY OF ONCOLOGIC HISTORY:   Bilateral breast cancer ([place])   07/23/2013 Mammogram    Bilateral breast masses. 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 N1 M0 stage IIB ER 100%, PR 0%, HER-2       06/17/2014 -  Radiation Therapy    Adjuvant radiation therapy      09/14/2014 -  Anti-estrogen oral therapy    Anastrozole 1 mg daily      05/28/2015 Imaging    CT scans: Enlarging subpectoral masses 3.1 x 3.5 cm, posteriorly lower density mass 4.5 x 2.1 cm, several right-sided lung nodules right lower lobe 1.4 cm, 3 other right lung nodules, 1.7 cm right iliac bone lesion      06/21/2015 Procedure    Left subpectoral mass biopsy: Invasive high-grade ductal carcinoma ER 5%, PR 0%,  HER-2 negative ratio 1.29      09/01/2015 Imaging    Left chest wall mass increased in size 7.2 x 5.1 cm, multiple subcutaneous nodules, increase in the lung nodules both in number as well as in the size of existing nodules      09/10/2015 -  Chemotherapy    Carboplatin, gemcitabine days 1 and 8 q 3 weeks, carboplatin discontinued for neuropathy (treatment break from 01/20/2016 to 03/24/2016); Added Carboplatin back 04/14/16 (for progression)      01/26/2016 Imaging    Marked improvement in size of lung nodules with many of the nodules resolved index right lower lobe nodule 1.9 x 1.8 cm is now 0.7 x 0.6 cm, reduction in the size of left breast mass 7.2 cm down to 2.6 cm, enlargement in the hypodense mass in the uterus      04/06/2016 Imaging    CT chest: Interval progression of pre-existing lung nodules new lung metastases (21m, 150m 2.9 cm, 1.6 cm), interval progression of disease in the left breast 4.2 cm (was 2.6 cm), additional nodules 2.3 cm and 3.2 cm; CT head: scalp mass 1.9 cm      06/27/2016 Imaging    Ct chest: Stable lesions in lt breast deep to Left pectoralis, cystic lesion loc ant in left breast inc compared  to previous; decrease in lung nodules (67m to 3 mm; 12 mm to 7 mm, RML nodule 2.9 cm to 1.8 cm, RLL 118mto 7 mm)       CHIEF COMPLIANT: Follow-up on palliative chemotherapy with gemcitabine and carboplatin.  INTERVAL HISTORY: LiIVERY MICHALSKIs a [age]ear old with above-mentioned history of metastatic breast cancer currently on palliative chemotherapy with gemcitabine and carboplatin. She is tolerating the chemotherapy very well. Her mother is in the hospital and she is very worried about her health. She denies any nausea vomiting. Recently she had a bone scan but CT scans are scheduled for later.  REVIEW OF SYSTEMS:   Constitutional: Denies fevers, chills or abnormal weight loss Eyes: Denies blurriness of vision Ears, nose, mouth, throat, and face: Denies mucositis or sore  throat Respiratory: Denies cough, dyspnea or wheezes Cardiovascular: Denies palpitation, chest discomfort Gastrointestinal:  Denies nausea, heartburn or change in bowel habits Skin: Denies abnormal skin rashes Lymphatics: Denies new lymphadenopathy or easy bruising Neurological:Denies numbness, tingling or new weaknesses Behavioral/Psych: Mood is stable, no new changes  Extremities: No lower extremity edema Breast: Palpable lump on the left chest wall All other systems were reviewed with the patient and are negative.  I have reviewed the past medical history, past surgical history, social history and family history with the patient and they are unchanged from previous note.  ALLERGIES:  is allergic to codeine and morphine and related.  MEDICATIONS:  Current Outpatient Prescriptions  Medication Sig Dispense Refill  . B Complex-C (SUPER B COMPLEX PO) Take 1 tablet by mouth daily.    . calcium-vitamin D (OSCAL-500) 500-400 MG-UNIT tablet Take 1 tablet by mouth 2 (two) times daily. 60 tablet 3  . docusate sodium (COLACE) 100 MG capsule Take 1 capsule (100 mg total) by mouth every 12 (twelve) hours. 60 capsule 0  . gabapentin (NEURONTIN) 300 MG capsule TAKE 2 CAPSULES (600 MG TOTAL) BY MOUTH 3 (THREE) TIMES DAILY. 180 capsule 2  . LORazepam (ATIVAN) 1 MG tablet Take 1 tablet (1 mg total) by mouth at bedtime. 30 tablet 0  . nystatin (MYCOSTATIN) 100000 UNIT/ML suspension Take 5 mLs (500,000 Units total) by mouth 4 (four) times daily. 60 mL 0  . ondansetron (ZOFRAN) 4 MG tablet Take 1 tablet (4 mg total) by mouth every 8 (eight) hours as needed for nausea or vomiting. 30 tablet 0  . oxyCODONE-acetaminophen (PERCOCET/ROXICET) 5-325 MG tablet 1 tabs PO q6h prn pain 60 tablet 0  . prochlorperazine (COMPAZINE) 10 MG tablet TAKE 1 TABLET EVERY 6 HOURS AS NEEDED NAUEA OR VOMITING 30 tablet 1  . traZODone (DESYREL) 50 MG tablet Take 1 tablet (50 mg total) by mouth at bedtime. 7 tablet 0  . zolpidem  (AMBIEN) 10 MG tablet Take 1 tablet (10 mg total) by mouth at bedtime. 30 tablet 2   No current facility-administered medications for this visit.    Facility-Administered Medications Ordered in Other Visits  Medication Dose Route Frequency Provider Last Rate Last Dose  . sodium chloride flush (NS) 0.9 % injection 10 mL  10 mL Intracatheter PRN ViNicholas LoseMD   10 mL at 03/31/16 1352    PHYSICAL EXAMINATION: ECOG PERFORMANCE STATUS: 1 - Symptomatic but completely ambulatory  Vitals:   09/21/16 1016  BP: 128/72  Pulse: 72  Resp: 17  Temp: 98.7 F (37.1 C)   Filed Weights   09/21/16 1016  Weight: 127 lb 6.4 oz (57.8 kg)    GENERAL:alert, no distress and comfortable SKIN: skin  color, texture, turgor are normal, no rashes or significant lesions EYES: normal, Conjunctiva are pink and non-injected, sclera clear OROPHARYNX:no exudate, no erythema and lips, buccal mucosa, and tongue normal  NECK: supple, thyroid normal size, non-tender, without nodularity LYMPH:  no palpable lymphadenopathy in the cervical, axillary or inguinal LUNGS: clear to auscultation and percussion with normal breathing effort HEART: regular rate & rhythm and no murmurs and no lower extremity edema ABDOMEN:abdomen soft, non-tender and normal bowel sounds MUSCULOSKELETAL:no cyanosis of digits and no clubbing  NEURO: alert & oriented x 3 with fluent speech, no focal motor/sensory deficits EXTREMITIES: No lower extremity edema  LABORATORY DATA:  I have reviewed the data as listed   Chemistry      Component Value Date/Time   NA 139 09/19/2016 1211   K 3.4 (L) 09/19/2016 1211   CL 102 06/09/2016 0110   CO2 25 09/19/2016 1211   BUN 9.7 09/19/2016 1211   CREATININE 0.7 09/19/2016 1211      Component Value Date/Time   CALCIUM 9.5 09/19/2016 1211   ALKPHOS 61 09/19/2016 1211   AST 65 (H) 09/19/2016 1211   ALT 50 09/19/2016 1211   BILITOT 0.45 09/19/2016 1211       Lab Results  Component Value Date     WBC 5.2 09/19/2016   HGB 9.0 (L) 09/19/2016   HCT 28.3 (L) 09/19/2016   MCV 76.7 (L) 09/19/2016   PLT 141 (L) 09/19/2016   NEUTROABS 2.7 09/19/2016    ASSESSMENT & PLAN:  Bilateral breast cancer ([place]) Treated with neoadjuvant chemotherapy followed by surgery 04/08/2014, radiation treatment completed 07/17/2014, antiestrogen therapy with anastrozole 09/14/2014 Left breastT2, N0, M0, IDC grade 3; 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 N1 M0 stage IIB Metastatic diseasediagnosed 05/28/15 (subpectoral masses 3.5 cm and 4.5 cm. Four lung nodules the largest 1.4 cm in the right lung 1.7 cm sclerotic right iliac bone lesion) Left subpectoral mass biopsy 06/21/2015: Invasive high-grade ductal carcinoma ER 5%, PR 0%, HER-2 negative ratio 1.29 Prior treatment:  1. Xeloda 1500 mg by mouth twice a day started 07/07/2015 stopped 09/03/2015 (stopped for progression) 2. Carboplatin day 1 and gemcitabine days 1 and 8 every 3 weeks started 09/10/2015; completed cycle 5 01/20/2016 (carboplatin discontinued for neuropathy) ------------------------------------------------------------------------------------------------------------------------------------------ Current treatment: Carboplatin and gemcitabine (carboplatin added because of progression of disease 04/14/2016), This is cycle 14 (Chemotherapy being given on day 1, 8q 3 weeks) with neulasta  Chemotherapy toxicities: 1. Fatigue 2. Anemia due to chemotherapy: hgb improved mildly to 9.0 3. Severe leukocytosis with neutrophils markedly increased due to Neulasta: Neulasta discontinued , will resume Neulasta because of neutropenia 4. Grade 2 neuropathy  Pain issues: Oxycontin for long acting pain medication. Pain well controlled Insomnia: D/Ced Ativan. Given  trazodone Lymphedema: Continue to wear compression sleeves daily. Patient does not use it often Bone mets: Xgeva q 6 weeks with calcium and Vit D CT chest 06/27/16: Stable lesions in lt breast and deep to Left pectoralis, cystic lesion loc ant in left breast with enh nodular comp which inc compared to previous  Vaginal bleeding: Patient is contemplating on doing a D&C with Dr. Garwin Brothers Scalp cyst could be a sebaceous cyst: Dr. Marlou Starks is planning on surgery on the cyst. We will adjust her treatment so that she can get the surgery done.  Bone scan 09/20/2016: Asymmetric uptake  at the level of the tip of the left scapula or within an adjacent rib. No evidence of active bone metastases elsewhere. CT chest abdomen pelvis are pending  Return to clinic in 3 weeks for next cycle    I spent 25 minutes talking to the patient of which more than half was spent in counseling and coordination of care.  No orders of the defined types were placed in this encounter.  The patient has a good understanding of the overall plan. she agrees with it. she will call with any problems that may develop before the next visit here.   Rulon Eisenmenger, MD 09/21/16

## 2016-09-22 ENCOUNTER — Ambulatory Visit: Payer: Medicare Other

## 2016-09-27 ENCOUNTER — Encounter (HOSPITAL_BASED_OUTPATIENT_CLINIC_OR_DEPARTMENT_OTHER): Payer: Self-pay | Admitting: Anesthesiology

## 2016-09-27 NOTE — Progress Notes (Signed)
Multiple attempts have been made to contact patient for surgery without success. Dr Ethlyn Gallery office notified and there are no other phone #'s available.

## 2016-09-28 ENCOUNTER — Other Ambulatory Visit: Payer: Medicare Other

## 2016-09-28 ENCOUNTER — Ambulatory Visit: Payer: Medicare Other

## 2016-09-29 ENCOUNTER — Ambulatory Visit: Payer: Medicare Other

## 2016-10-03 MED ORDER — DIPHENHYDRAMINE HCL 25 MG PO CAPS
ORAL_CAPSULE | ORAL | Status: AC
  Filled 2016-10-03: qty 1

## 2016-10-03 MED ORDER — ACETAMINOPHEN 325 MG PO TABS
ORAL_TABLET | ORAL | Status: AC
  Filled 2016-10-03: qty 2

## 2016-10-04 ENCOUNTER — Other Ambulatory Visit: Payer: Self-pay

## 2016-10-04 DIAGNOSIS — C50012 Malignant neoplasm of nipple and areola, left female breast: Principal | ICD-10-CM

## 2016-10-04 DIAGNOSIS — C50919 Malignant neoplasm of unspecified site of unspecified female breast: Secondary | ICD-10-CM

## 2016-10-04 DIAGNOSIS — C7801 Secondary malignant neoplasm of right lung: Secondary | ICD-10-CM

## 2016-10-04 DIAGNOSIS — C50011 Malignant neoplasm of nipple and areola, right female breast: Secondary | ICD-10-CM

## 2016-10-04 DIAGNOSIS — C7951 Secondary malignant neoplasm of bone: Secondary | ICD-10-CM

## 2016-10-05 ENCOUNTER — Telehealth: Payer: Self-pay | Admitting: Hematology and Oncology

## 2016-10-05 ENCOUNTER — Other Ambulatory Visit (HOSPITAL_BASED_OUTPATIENT_CLINIC_OR_DEPARTMENT_OTHER): Payer: Medicare Other

## 2016-10-05 ENCOUNTER — Ambulatory Visit (HOSPITAL_BASED_OUTPATIENT_CLINIC_OR_DEPARTMENT_OTHER): Payer: Medicare Other

## 2016-10-05 ENCOUNTER — Other Ambulatory Visit: Payer: Self-pay

## 2016-10-05 ENCOUNTER — Ambulatory Visit: Payer: Medicare Other

## 2016-10-05 VITALS — BP 123/67 | HR 82 | Temp 99.0°F | Resp 17

## 2016-10-05 DIAGNOSIS — C50012 Malignant neoplasm of nipple and areola, left female breast: Secondary | ICD-10-CM

## 2016-10-05 DIAGNOSIS — C50919 Malignant neoplasm of unspecified site of unspecified female breast: Secondary | ICD-10-CM

## 2016-10-05 DIAGNOSIS — C50511 Malignant neoplasm of lower-outer quadrant of right female breast: Secondary | ICD-10-CM

## 2016-10-05 DIAGNOSIS — C50911 Malignant neoplasm of unspecified site of right female breast: Secondary | ICD-10-CM

## 2016-10-05 DIAGNOSIS — C7801 Secondary malignant neoplasm of right lung: Secondary | ICD-10-CM

## 2016-10-05 DIAGNOSIS — C7951 Secondary malignant neoplasm of bone: Secondary | ICD-10-CM

## 2016-10-05 DIAGNOSIS — C50011 Malignant neoplasm of nipple and areola, right female breast: Secondary | ICD-10-CM

## 2016-10-05 DIAGNOSIS — Z5111 Encounter for antineoplastic chemotherapy: Secondary | ICD-10-CM

## 2016-10-05 DIAGNOSIS — Z170 Estrogen receptor positive status [ER+]: Secondary | ICD-10-CM

## 2016-10-05 DIAGNOSIS — C50912 Malignant neoplasm of unspecified site of left female breast: Secondary | ICD-10-CM

## 2016-10-05 DIAGNOSIS — C50512 Malignant neoplasm of lower-outer quadrant of left female breast: Principal | ICD-10-CM

## 2016-10-05 LAB — COMPREHENSIVE METABOLIC PANEL
ALBUMIN: 3 g/dL — AB (ref 3.5–5.0)
ALK PHOS: 63 U/L (ref 40–150)
ALT: 21 U/L (ref 0–55)
ANION GAP: 8 meq/L (ref 3–11)
AST: 51 U/L — ABNORMAL HIGH (ref 5–34)
BILIRUBIN TOTAL: 0.54 mg/dL (ref 0.20–1.20)
BUN: 9.2 mg/dL (ref 7.0–26.0)
CALCIUM: 9.8 mg/dL (ref 8.4–10.4)
CO2: 28 mEq/L (ref 22–29)
Chloride: 103 mEq/L (ref 98–109)
Creatinine: 0.8 mg/dL (ref 0.6–1.1)
GLUCOSE: 146 mg/dL — AB (ref 70–140)
Potassium: 3.8 mEq/L (ref 3.5–5.1)
Sodium: 139 mEq/L (ref 136–145)
TOTAL PROTEIN: 7.1 g/dL (ref 6.4–8.3)

## 2016-10-05 LAB — CBC WITH DIFFERENTIAL/PLATELET
BASO%: 0.4 % (ref 0.0–2.0)
BASOS ABS: 0 10*3/uL (ref 0.0–0.1)
EOS ABS: 0.1 10*3/uL (ref 0.0–0.5)
EOS%: 1.6 % (ref 0.0–7.0)
HCT: 28.8 % — ABNORMAL LOW (ref 34.8–46.6)
HGB: 9 g/dL — ABNORMAL LOW (ref 11.6–15.9)
LYMPH%: 25.9 % (ref 14.0–49.7)
MCH: 23.2 pg — AB (ref 25.1–34.0)
MCHC: 31.3 g/dL — ABNORMAL LOW (ref 31.5–36.0)
MCV: 74.2 fL — AB (ref 79.5–101.0)
MONO#: 1 10*3/uL — AB (ref 0.1–0.9)
MONO%: 13.8 % (ref 0.0–14.0)
NEUT%: 58.3 % (ref 38.4–76.8)
NEUTROS ABS: 4.3 10*3/uL (ref 1.5–6.5)
PLATELETS: 474 10*3/uL — AB (ref 145–400)
RBC: 3.88 10*6/uL (ref 3.70–5.45)
RDW: 18.1 % — AB (ref 11.2–14.5)
WBC: 7.3 10*3/uL (ref 3.9–10.3)
lymph#: 1.9 10*3/uL (ref 0.9–3.3)

## 2016-10-05 MED ORDER — OXYCODONE-ACETAMINOPHEN 5-325 MG PO TABS: ORAL_TABLET | ORAL | 0 refills | Status: DC

## 2016-10-05 MED ORDER — DEXAMETHASONE SODIUM PHOSPHATE 10 MG/ML IJ SOLN
INTRAMUSCULAR | Status: AC
  Filled 2016-10-05: qty 1

## 2016-10-05 MED ORDER — HEPARIN SOD (PORK) LOCK FLUSH 100 UNIT/ML IV SOLN
500.0000 [IU] | Freq: Once | INTRAVENOUS | Status: DC | PRN
  Filled 2016-10-05: qty 5

## 2016-10-05 MED ORDER — PALONOSETRON HCL INJECTION 0.25 MG/5ML
INTRAVENOUS | Status: AC
  Filled 2016-10-05: qty 5

## 2016-10-05 MED ORDER — SODIUM CHLORIDE 0.9% FLUSH
10.0000 mL | INTRAVENOUS | Status: DC | PRN
  Administered 2016-10-05: 10 mL via INTRAVENOUS
  Filled 2016-10-05: qty 10

## 2016-10-05 MED ORDER — SODIUM CHLORIDE 0.9 % IV SOLN
Freq: Once | INTRAVENOUS | Status: AC
  Administered 2016-10-05: 13:00:00 via INTRAVENOUS

## 2016-10-05 MED ORDER — HEPARIN SOD (PORK) LOCK FLUSH 100 UNIT/ML IV SOLN
500.0000 [IU] | Freq: Once | INTRAVENOUS | Status: AC | PRN
  Administered 2016-10-05: 500 [IU]
  Filled 2016-10-05: qty 5

## 2016-10-05 MED ORDER — SODIUM CHLORIDE 0.9% FLUSH
10.0000 mL | INTRAVENOUS | Status: DC | PRN
  Administered 2016-10-05: 10 mL
  Filled 2016-10-05: qty 10

## 2016-10-05 MED ORDER — CARBOPLATIN CHEMO INTRADERMAL TEST DOSE 100MCG/0.02ML
100.0000 ug | Freq: Once | INTRADERMAL | Status: AC
  Administered 2016-10-05: 100 ug via INTRADERMAL
  Filled 2016-10-05: qty 0.02

## 2016-10-05 MED ORDER — SODIUM CHLORIDE 0.9 % IV SOLN
550.0000 mg/m2 | Freq: Once | INTRAVENOUS | Status: AC
  Administered 2016-10-05: 912 mg via INTRAVENOUS
  Filled 2016-10-05: qty 23.99

## 2016-10-05 MED ORDER — DEXAMETHASONE SODIUM PHOSPHATE 10 MG/ML IJ SOLN
10.0000 mg | Freq: Once | INTRAMUSCULAR | Status: AC
  Administered 2016-10-05: 10 mg via INTRAVENOUS

## 2016-10-05 MED ORDER — PALONOSETRON HCL INJECTION 0.25 MG/5ML
0.2500 mg | Freq: Once | INTRAVENOUS | Status: AC
  Administered 2016-10-05: 0.25 mg via INTRAVENOUS

## 2016-10-05 MED ORDER — CARBOPLATIN CHEMO INJECTION 600 MG/60ML
482.0000 mg | Freq: Once | INTRAVENOUS | Status: AC
  Administered 2016-10-05: 480 mg via INTRAVENOUS
  Filled 2016-10-05: qty 48

## 2016-10-05 MED FILL — OXYCODONE/APAP 5/325 MG TAB: 5-325 | 15 days supply | Qty: 60 | Fill #0

## 2016-10-05 NOTE — Telephone Encounter (Signed)
sw pt to confirm 3/15 appt at 115 per LOS

## 2016-10-05 NOTE — Patient Instructions (Signed)

## 2016-10-05 NOTE — Telephone Encounter (Signed)
Pt called to get refill for her pain medication. Pt states that she had been recently taking 4x a day. She states that her mom passed away 2 days ago and had been in pain. Pt is in chemo today and will be around for a few hrs.   1115- Gave pt prescription refill for pain medication. Pt wanted to let Dr.Gudena know that she had cancelled her surgery that was scheduled 3/1 due to her mother being ill and recently passing away. Pt states that she is ready to have this done sooner than new scheduled 11/08/16 surgery. Pt needs to be off of chemo for 2 weeks. Will notifiy Dr.Gudena and will have pt come for follow up appt next week. Pt verbalized understanding.

## 2016-10-05 NOTE — Patient Instructions (Signed)
Cancer Center Discharge Instructions for Patients Receiving Chemotherapy  Today you received the following chemotherapy agents:  Carboplatin, Gemzar  To help prevent nausea and vomiting after your treatment, we encourage you to take your nausea medication as prescribed.   If you develop nausea and vomiting that is not controlled by your nausea medication, call the clinic.   BELOW ARE SYMPTOMS THAT SHOULD BE REPORTED IMMEDIATELY:  *FEVER GREATER THAN 100.5 F  *CHILLS WITH OR WITHOUT FEVER  NAUSEA AND VOMITING THAT IS NOT CONTROLLED WITH YOUR NAUSEA MEDICATION  *UNUSUAL SHORTNESS OF BREATH  *UNUSUAL BRUISING OR BLEEDING  TENDERNESS IN MOUTH AND THROAT WITH OR WITHOUT PRESENCE OF ULCERS  *URINARY PROBLEMS  *BOWEL PROBLEMS  UNUSUAL RASH Items with * indicate a potential emergency and should be followed up as soon as possible.  Feel free to call the clinic you have any questions or concerns. The clinic phone number is [phone].  Please show the CHEMO ALERT CARD at check-in to the Emergency Department and triage nurse.   

## 2016-10-06 ENCOUNTER — Telehealth: Payer: Self-pay

## 2016-10-06 ENCOUNTER — Ambulatory Visit: Payer: Medicare Other

## 2016-10-06 NOTE — Telephone Encounter (Signed)
Pt requested to speak with May RN. Did not want to leave message with triage RN, forwarded call.

## 2016-10-09 ENCOUNTER — Telehealth: Payer: Self-pay

## 2016-10-09 NOTE — Telephone Encounter (Signed)
Called pt to follow up on her chest pain that she was having last Saturday. Received message from after hr triage line. Pt states that she is feeling much better. She was having chest pain on her left chest where the tumor is located. Pt stated that it was swollen and tender. Pt states that she only took 1/2 of her pain medication and didn't know if it was a pulled muscle or something else. Pt stated that she had a lot of people over the weekend, since her mom recently passed away last week. Pt was cleaning out her oven and might have pulled a muscle. Pt was instructed to take ibuprofen over the weekend and to take her oxycodone pill. Pt is feeling much improved today and has no swelling, chest pain or tenderness. Reminded pt of appointment this week and pt confirmed her appt. Told pt to call for any more questions and concerns. Pt verbalized understanding and appreciates the follow up call.

## 2016-10-11 ENCOUNTER — Other Ambulatory Visit: Payer: Self-pay | Admitting: Emergency Medicine

## 2016-10-11 DIAGNOSIS — C50011 Malignant neoplasm of nipple and areola, right female breast: Secondary | ICD-10-CM

## 2016-10-11 DIAGNOSIS — C50012 Malignant neoplasm of nipple and areola, left female breast: Principal | ICD-10-CM

## 2016-10-12 ENCOUNTER — Ambulatory Visit (HOSPITAL_BASED_OUTPATIENT_CLINIC_OR_DEPARTMENT_OTHER): Payer: Medicare Other

## 2016-10-12 ENCOUNTER — Ambulatory Visit: Payer: Medicare Other

## 2016-10-12 ENCOUNTER — Encounter: Payer: Self-pay | Admitting: Hematology and Oncology

## 2016-10-12 ENCOUNTER — Ambulatory Visit (HOSPITAL_BASED_OUTPATIENT_CLINIC_OR_DEPARTMENT_OTHER): Payer: Medicare Other | Admitting: Hematology and Oncology

## 2016-10-12 DIAGNOSIS — C7801 Secondary malignant neoplasm of right lung: Secondary | ICD-10-CM

## 2016-10-12 DIAGNOSIS — C50012 Malignant neoplasm of nipple and areola, left female breast: Secondary | ICD-10-CM

## 2016-10-12 DIAGNOSIS — C50919 Malignant neoplasm of unspecified site of unspecified female breast: Secondary | ICD-10-CM

## 2016-10-12 DIAGNOSIS — Z170 Estrogen receptor positive status [ER+]: Secondary | ICD-10-CM

## 2016-10-12 DIAGNOSIS — Z5111 Encounter for antineoplastic chemotherapy: Secondary | ICD-10-CM | POA: Diagnosis not present

## 2016-10-12 DIAGNOSIS — C7951 Secondary malignant neoplasm of bone: Secondary | ICD-10-CM

## 2016-10-12 DIAGNOSIS — D6481 Anemia due to antineoplastic chemotherapy: Secondary | ICD-10-CM

## 2016-10-12 DIAGNOSIS — C50011 Malignant neoplasm of nipple and areola, right female breast: Secondary | ICD-10-CM

## 2016-10-12 DIAGNOSIS — C50912 Malignant neoplasm of unspecified site of left female breast: Secondary | ICD-10-CM

## 2016-10-12 DIAGNOSIS — C50911 Malignant neoplasm of unspecified site of right female breast: Secondary | ICD-10-CM

## 2016-10-12 LAB — COMPREHENSIVE METABOLIC PANEL
ALT: 48 U/L (ref 0–55)
AST: 72 U/L — AB (ref 5–34)
Albumin: 3.1 g/dL — ABNORMAL LOW (ref 3.5–5.0)
Alkaline Phosphatase: 71 U/L (ref 40–150)
Anion Gap: 11 mEq/L (ref 3–11)
BUN: 14.4 mg/dL (ref 7.0–26.0)
CHLORIDE: 102 meq/L (ref 98–109)
CO2: 26 meq/L (ref 22–29)
Calcium: 9.6 mg/dL (ref 8.4–10.4)
Creatinine: 1 mg/dL (ref 0.6–1.1)
EGFR: 68 mL/min/{1.73_m2} — AB (ref >90)
GLUCOSE: 162 mg/dL — AB (ref 70–140)
POTASSIUM: 3.7 meq/L (ref 3.5–5.1)
SODIUM: 139 meq/L (ref 136–145)
Total Bilirubin: 0.27 mg/dL (ref 0.20–1.20)
Total Protein: 7.4 g/dL (ref 6.4–8.3)

## 2016-10-12 LAB — CBC WITH DIFFERENTIAL/PLATELET
BASO%: 0.8 % (ref 0.0–2.0)
BASOS ABS: 0 10*3/uL (ref 0.0–0.1)
EOS%: 0.6 % (ref 0.0–7.0)
Eosinophils Absolute: 0 10*3/uL (ref 0.0–0.5)
HCT: 28 % — ABNORMAL LOW (ref 34.8–46.6)
HEMOGLOBIN: 8.9 g/dL — AB (ref 11.6–15.9)
LYMPH%: 43 % (ref 14.0–49.7)
MCH: 23.4 pg — ABNORMAL LOW (ref 25.1–34.0)
MCHC: 31.7 g/dL (ref 31.5–36.0)
MCV: 73.8 fL — AB (ref 79.5–101.0)
MONO#: 0.5 10*3/uL (ref 0.1–0.9)
MONO%: 12.8 % (ref 0.0–14.0)
NEUT#: 1.8 10*3/uL (ref 1.5–6.5)
NEUT%: 42.8 % (ref 38.4–76.8)
Platelets: 351 10*3/uL (ref 145–400)
RBC: 3.79 10*6/uL (ref 3.70–5.45)
RDW: 18.9 % — AB (ref 11.2–14.5)
WBC: 4.1 10*3/uL (ref 3.9–10.3)
lymph#: 1.8 10*3/uL (ref 0.9–3.3)

## 2016-10-12 MED ORDER — HEPARIN SOD (PORK) LOCK FLUSH 100 UNIT/ML IV SOLN
500.0000 [IU] | Freq: Once | INTRAVENOUS | Status: AC | PRN
  Administered 2016-10-12: 500 [IU]
  Filled 2016-10-12: qty 5

## 2016-10-12 MED ORDER — PROCHLORPERAZINE MALEATE 10 MG PO TABS: ORAL_TABLET | ORAL | 1 refills | Status: DC

## 2016-10-12 MED ORDER — SODIUM CHLORIDE 0.9 % IV SOLN
Freq: Once | INTRAVENOUS | Status: AC
  Administered 2016-10-12: 15:00:00 via INTRAVENOUS

## 2016-10-12 MED ORDER — PROCHLORPERAZINE MALEATE 10 MG PO TABS
ORAL_TABLET | ORAL | Status: AC
  Filled 2016-10-12: qty 1

## 2016-10-12 MED ORDER — PROCHLORPERAZINE MALEATE 10 MG PO TABS
10.0000 mg | ORAL_TABLET | Freq: Once | ORAL | Status: AC
  Administered 2016-10-12: 10 mg via ORAL

## 2016-10-12 MED ORDER — LIDOCAINE-PRILOCAINE 2.5-2.5 % EX CREA: 1.0000 "application " | TOPICAL_CREAM | CUTANEOUS | 6 refills | Status: DC | PRN

## 2016-10-12 MED ORDER — SODIUM CHLORIDE 0.9% FLUSH
10.0000 mL | INTRAVENOUS | Status: DC | PRN
  Administered 2016-10-12: 10 mL
  Filled 2016-10-12: qty 10

## 2016-10-12 MED ORDER — LORAZEPAM 1 MG PO TABS: 1.0000 mg | ORAL_TABLET | Freq: Every day | ORAL | 0 refills | Status: DC

## 2016-10-12 MED ORDER — GEMCITABINE HCL CHEMO INJECTION 1 GM/26.3ML
550.0000 mg/m2 | Freq: Once | INTRAVENOUS | Status: AC
  Administered 2016-10-12: 912 mg via INTRAVENOUS
  Filled 2016-10-12: qty 23.99

## 2016-10-12 MED ORDER — SODIUM CHLORIDE 0.9% FLUSH
10.0000 mL | INTRAVENOUS | Status: DC | PRN
  Administered 2016-10-12: 10 mL via INTRAVENOUS
  Filled 2016-10-12: qty 10

## 2016-10-12 NOTE — Progress Notes (Signed)
Patient Care Team: Paula Donna, MD (Inactive) as PCP - General (Family Medicine) Paula Savannah, MD (Cardiology)  DIAGNOSIS:  Encounter Diagnosis  Name Primary?  . 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 N1 M0 stage IIB ER 100%, PR 0%, HER-2       06/17/2014 -  Radiation Therapy    Adjuvant radiation therapy      09/14/2014 -  Anti-estrogen oral therapy    Anastrozole 1 mg daily      05/28/2015 Imaging    CT scans: Enlarging subpectoral masses 3.1 x 3.5 cm, posteriorly lower density mass 4.5 x 2.1 cm, several right-sided lung nodules right lower lobe 1.4 cm, 3 other right lung nodules, 1.7 cm right iliac bone lesion      06/21/2015 Procedure    Left subpectoral mass biopsy:  Invasive high-grade ductal carcinoma ER 5%, PR 0%, HER-2 negative ratio 1.29      09/01/2015 Imaging    Left chest wall mass increased in size 7.2 x 5.1 cm, multiple subcutaneous nodules, increase in the lung nodules both in number as well as in the size of existing nodules      09/10/2015 -  Chemotherapy    Carboplatin, gemcitabine days 1 and 8 q 3 weeks, carboplatin discontinued for neuropathy (treatment break from 01/20/2016 to 03/24/2016); Added Carboplatin back 04/14/16 (for progression)      01/26/2016 Imaging    Marked improvement in size of lung nodules with many of the nodules resolved index right lower lobe nodule 1.9 x 1.8 cm is now 0.7 x 0.6 cm, reduction in the size of left breast mass 7.2 cm down to 2.6 cm, enlargement in the hypodense mass in the uterus      04/06/2016 Imaging    CT chest: Interval progression of pre-existing lung nodules new lung metastases (54m, 11m 2.9 cm, 1.6 cm), interval progression of disease in the left breast 4.2 cm (was 2.6 cm), additional nodules 2.3 cm and 3.2 cm; CT head: scalp mass 1.9 cm      06/27/2016 Imaging    Ct chest: Stable lesions in lt breast deep to Left pectoralis, cystic lesion loc ant in left  breast inc compared to previous; decrease in lung nodules (82m to 3 mm; 12 mm to 7 mm, RML nodule 2.9 cm to 1.8 cm, RLL 192mto 7 mm)       CHIEF COMPLIANT: Follow-up of metastatic breast cancer on chemotherapy  INTERVAL HISTORY: Paula Navarro a [age]ear old with above-mentioned is metastatic breast cancer is currently on carboplatin and gemcitabine chemotherapy. Her mother recently passed away and she is grieving from that. She denies any nausea vomiting. She does have tiredness related to chemotherapy.   REVIEW OF SYSTEMS:   Constitutional: Denies fevers, chills or abnormal weight loss Eyes: Denies blurriness of vision Ears, nose, mouth, throat, and face: Denies mucositis or sore throat Respiratory: Denies cough, dyspnea or  wheezes Cardiovascular: Denies palpitation, chest discomfort Gastrointestinal:  Denies nausea, heartburn or change in bowel habits Skin: Denies abnormal skin rashes Lymphatics: Denies new lymphadenopathy or easy bruising Neurological:Denies numbness, tingling or new weaknesses Behavioral/Psych: Mood is stable, no new changes  Extremities: No lower extremity edema Breast: Large left chest wall mass All other systems were reviewed with the patient and are negative.  I have reviewed the past medical history, past surgical history, social history and family history with the patient and they are unchanged from previous note.  ALLERGIES:  is allergic to codeine and morphine and related.  MEDICATIONS:  Current Outpatient Prescriptions  Medication Sig Dispense Refill  . B Complex-C (SUPER B COMPLEX PO) Take 1 tablet by mouth daily.    . calcium-vitamin D (OSCAL-500) 500-400 MG-UNIT tablet Take 1 tablet by mouth 2 (two) times daily. 60 tablet 3  . docusate sodium (COLACE) 100 MG capsule Take 1 capsule (100 mg total) by mouth every 12 (twelve) hours. 60 capsule 0  . gabapentin (NEURONTIN) 300 MG capsule TAKE 2 CAPSULES (600 MG TOTAL) BY MOUTH 3 (THREE) TIMES DAILY. 180 capsule 2  . LORazepam (ATIVAN) 1 MG tablet Take 1 tablet (1 mg total) by mouth at bedtime. 30 tablet 0  . nystatin (MYCOSTATIN) 100000 UNIT/ML suspension Take 5 mLs (500,000 Units total) by mouth 4 (four) times daily. 60 mL 0  . ondansetron (ZOFRAN) 4 MG tablet Take 1 tablet (4 mg total) by mouth every 8 (eight) hours as needed for nausea or vomiting. 30 tablet 0  . oxyCODONE-acetaminophen (PERCOCET/ROXICET) 5-325 MG tablet 1 tabs PO q6h prn pain 60 tablet 0  . prochlorperazine (COMPAZINE) 10 MG tablet TAKE 1 TABLET EVERY 6 HOURS AS NEEDED NAUEA OR VOMITING 30 tablet 1  . traZODone (DESYREL) 50 MG tablet Take 1 tablet (50 mg total) by mouth at bedtime. 7 tablet 0  . zolpidem (AMBIEN) 10 MG tablet Take 1 tablet (10 mg total) by mouth  at bedtime. 30 tablet 2   No current facility-administered medications for this visit.    Facility-Administered Medications Ordered in Other Visits  Medication Dose Route Frequency Provider Last Rate Last Dose  . sodium chloride flush (NS) 0.9 % injection 10 mL  10 mL Intracatheter PRN ViNicholas LoseMD   10 mL at 03/31/16 1352    PHYSICAL EXAMINATION: ECOG PERFORMANCE STATUS: 1 - Symptomatic but completely ambulatory  Vitals:   10/12/16 1346  BP: 135/68  Pulse: 76  Resp: 18  Temp: 97.9 F (36.6 C)   Filed Weights   10/12/16 1346  Weight: 129 lb 6 oz (58.7 kg)    GENERAL:alert, no distress and comfortable SKIN: skin color, texture, turgor are normal, no rashes or significant lesions EYES: normal, Conjunctiva are pink and non-injected,  sclera clear OROPHARYNX:no exudate, no erythema and lips, buccal mucosa, and tongue normal  NECK: supple, thyroid normal size, non-tender, without nodularity LYMPH:  no palpable lymphadenopathy in the cervical, axillary or inguinal LUNGS: clear to auscultation and percussion with normal breathing effort HEART: regular rate & rhythm and no murmurs and no lower extremity edema ABDOMEN:abdomen soft, non-tender and normal bowel sounds MUSCULOSKELETAL:no cyanosis of digits and no clubbing  NEURO: alert & oriented x 3 with fluent speech, no focal motor/sensory deficits EXTREMITIES: No lower extremity edema BREAST:Palpable large left chest wall mass. (exam performed in the presence of a chaperone)  LABORATORY DATA:  I have reviewed the data as listed   Chemistry      Component Value Date/Time   NA 139 10/12/2016 1314   K 3.7 10/12/2016 1314   CL 102 06/09/2016 0110   CO2 26 10/12/2016 1314   BUN 14.4 10/12/2016 1314   CREATININE 1.0 10/12/2016 1314      Component Value Date/Time   CALCIUM 9.6 10/12/2016 1314   ALKPHOS 71 10/12/2016 1314   AST 72 (H) 10/12/2016 1314   ALT 48 10/12/2016 1314   BILITOT 0.27 10/12/2016 1314       Lab  Results  Component Value Date   WBC 4.1 10/12/2016   HGB 8.9 (L) 10/12/2016   HCT 28.0 (L) 10/12/2016   MCV 73.8 (L) 10/12/2016   PLT 351 10/12/2016   NEUTROABS 1.8 10/12/2016    ASSESSMENT & PLAN:  Bilateral breast cancer ([place]) Treated with neoadjuvant chemotherapy followed by surgery 04/08/2014, radiation treatment completed 07/17/2014, antiestrogen therapy with anastrozole 09/14/2014 Left breastT2, N0, M0, IDC grade 3; 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 N1 M0 stage IIB Metastatic diseasediagnosed 05/28/15 (subpectoral masses 3.5 cm and 4.5 cm. Four lung nodules the largest 1.4 cm in the right lung 1.7 cm sclerotic right iliac bone lesion) Left subpectoral mass biopsy 06/21/2015: Invasive high-grade ductal carcinoma ER 5%, PR 0%, HER-2 negative ratio 1.29 Prior treatment:  1. Xeloda 1500 mg by mouth twice a day started 07/07/2015 stopped 09/03/2015 (stopped for progression) 2. Carboplatin day 1 and gemcitabine days 1 and 8 every 3 weeks started 09/10/2015; completed cycle 5 01/20/2016 (carboplatin discontinued for neuropathy) ------------------------------------------------------------------------------------------------------------------------------------------ Current treatment: Carboplatin and gemcitabine (carboplatin added because of progression of disease 04/14/2016), This is cycle 12 day 8 (Chemotherapy being given on day 1, 8q 3 weeks) with neulasta  Chemotherapy toxicities: 1. Fatigue 2. Anemia due to chemotherapy: hgb improved mildly to 9.0 3. Severe leukocytosis with neutrophils markedly increased due to Neulasta: Neulasta discontinued , will resume Neulasta because of neutropenia 4. Grade 2 neuropathy  Pain issues: Oxycontin for long acting pain medication. Pain well controlled Insomnia: D/Ced  Ativan. Given trazodone Lymphedema: Continue to wear compression sleeves daily. Patient does not use it often Bone mets: Xgeva q 6 weeks with calcium and Vit D CT chest 06/27/16: Stable lesions in lt breast and deep to Left pectoralis, cystic lesion loc ant in left breast with enh nodular comp which inc compared to previous  Vaginal bleeding: Patient is contemplating on doing a D&C with Dr. Garwin Brothers Scalp cyst could be a sebaceous cyst: Dr. Marlou Starks is planning on surgery on the cyst. We will adjust her treatment so that she can get the surgery done.  Bone scan 09/20/2016: Asymmetric uptake at the level of  the tip of the left scapula or within an adjacent rib. No evidence of active bone metastases elsewhere. I would like to treat her with chemotherapy today as well as in 2 weeks. After that we'll have a treatment break until she completes surgery on the scalp. We will treat her again on 11/16/2016.  Return to clinic in 3 weeks for next cycle   I spent 25 minutes talking to the patient of which more than half was spent in counseling and coordination of care.  No orders of the defined types were placed in this encounter.  The patient has a good understanding of the overall plan. she agrees with it. she will call with any problems that may develop before the next visit here.   Rulon Eisenmenger, MD 10/12/16

## 2016-10-12 NOTE — Assessment & Plan Note (Signed)
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 N1 M0 stage IIB Metastatic diseasediagnosed 05/28/15 (subpectoral masses 3.5 cm and 4.5 cm. Four lung nodules the largest 1.4 cm in the right lung 1.7 cm sclerotic right iliac bone lesion) Left subpectoral mass biopsy 06/21/2015: Invasive high-grade ductal carcinoma ER 5%, PR 0%, HER-2 negative ratio 1.29 Prior treatment:  1. Xeloda 1500 mg by mouth twice a day started 07/07/2015 stopped 09/03/2015 (stopped for progression) 2. Carboplatin day 1 and gemcitabine days 1 and 8 every 3 weeks started 09/10/2015; completed cycle 5 01/20/2016 (carboplatin discontinued for neuropathy) ------------------------------------------------------------------------------------------------------------------------------------------ Current treatment: Carboplatin and gemcitabine (carboplatin added because of progression of disease 04/14/2016), This is cycle 12 day 8 (Chemotherapy being given on day 1, 8q 3 weeks) with neulasta  Chemotherapy toxicities: 1. Fatigue 2. Anemia due to chemotherapy: hgb improved mildly to 9.0 3. Severe leukocytosis with neutrophils markedly increased due to Neulasta: Neulasta discontinued , will resume Neulasta because of neutropenia 4. Grade 2 neuropathy  Pain issues: Oxycontin for long acting pain medication. Pain well controlled Insomnia: D/Ced Ativan. Given trazodone Lymphedema: Continue to wear compression sleeves daily. Patient does not use it often Bone mets: Xgeva q 6 weeks with calcium and Vit D CT chest 06/27/16: Stable lesions in lt breast and deep to Left pectoralis,  cystic lesion loc ant in left breast with enh nodular comp which inc compared to previous  Vaginal bleeding: Patient is contemplating on doing a D&C with Dr. Cousins Scalp cyst could be a sebaceous cyst: Dr. Toth is planning on surgery on the cyst. We will adjust her treatment so that she can get the surgery done.  Bone scan 09/20/2016: Asymmetric uptake at the level of the tip of the left scapula or within an adjacent rib. No evidence of active bone metastases elsewhere.  Return to clinic in 3 weeks for next cycle 

## 2016-10-12 NOTE — Patient Instructions (Signed)
[facility] Discharge Instructions for Patients Receiving Chemotherapy  Today you received the following chemotherapy agents:  Gemzar (gemcitibine)  To help prevent nausea and vomiting after your treatment, we encourage you to take your nausea medication as prescribed.   If you develop nausea and vomiting that is not controlled by your nausea medication, call the clinic.   BELOW ARE SYMPTOMS THAT SHOULD BE REPORTED IMMEDIATELY:  *FEVER GREATER THAN 100.5 F  *CHILLS WITH OR WITHOUT FEVER  NAUSEA AND VOMITING THAT IS NOT CONTROLLED WITH YOUR NAUSEA MEDICATION  *UNUSUAL SHORTNESS OF BREATH  *UNUSUAL BRUISING OR BLEEDING  TENDERNESS IN MOUTH AND THROAT WITH OR WITHOUT PRESENCE OF ULCERS  *URINARY PROBLEMS  *BOWEL PROBLEMS  UNUSUAL RASH Items with * indicate a potential emergency and should be followed up as soon as possible.  Feel free to call the clinic you have any questions or concerns. The clinic phone number is [phone].  Please show the [facility] at check-in to the Emergency Department and triage nurse.

## 2016-10-13 ENCOUNTER — Ambulatory Visit: Payer: Medicare Other

## 2016-10-13 ENCOUNTER — Telehealth: Payer: Self-pay | Admitting: *Deleted

## 2016-10-13 NOTE — Telephone Encounter (Signed)
Patient called and left message concerning getting shot today at [facility].   Called [facility] and spoke with Amy/Gretchen.  They are not able to give this patient her injection today b/c they do not know anything about the patient.  Called patient and spoke with her.  She does not feel like driving today and does not have anyone to bring her.  Let her know that [facility] is not able to do her injection.  Discussed with patient the possibility of coming tomorrow.  She is willing to do that.  She will come at 12:15pm tomorrow. Suggested that she discuss this with Dr. Lindi Adie at next visit and see if she is a candidate for On-Pro.  She states she will discuss with him.  Message to Dr. Lindi Adie and schedulers for appt. Tomorrow.

## 2016-10-14 DIAGNOSIS — C50011 Malignant neoplasm of nipple and areola, right female breast: Secondary | ICD-10-CM

## 2016-10-14 DIAGNOSIS — D701 Agranulocytosis secondary to cancer chemotherapy: Secondary | ICD-10-CM

## 2016-10-14 MED ORDER — PEGFILGRASTIM INJECTION 6 MG/0.6ML ~~LOC~~
6.0000 mg | PREFILLED_SYRINGE | Freq: Once | SUBCUTANEOUS | Status: AC
  Administered 2016-10-14: 6 mg via SUBCUTANEOUS
  Filled 2016-10-14: qty 0.6

## 2016-10-16 ENCOUNTER — Ambulatory Visit: Payer: Medicare Other

## 2016-10-16 ENCOUNTER — Telehealth: Payer: Self-pay

## 2016-10-16 NOTE — Telephone Encounter (Signed)
Called pt to confirm that she came Saturday 3/17 to get her neulasta shot. Pt states that she did come and get her neulasta Saturday. Pt had a scheduled inj appt today at 12:15. Confirmed with pt that she does not need to come in today. Pt verbalized understanding and notified flush nurse Army,RN.

## 2016-10-24 ENCOUNTER — Encounter: Payer: Self-pay | Admitting: *Deleted

## 2016-10-24 ENCOUNTER — Telehealth: Payer: Self-pay | Admitting: Emergency Medicine

## 2016-10-24 NOTE — Telephone Encounter (Signed)
Received message from after hours answering service that patient called complaining of a lot of pain and burning sensation. She inquires if Dr Lindi Adie can change the medication or maybe switch her to radiation for a while. Appointment made for patient to see Dr Lindi Adie on 3/29 to discuss treatment options. Attempted to call patient to notify her of this appointment time as the appointments that she has are now later in the day on 3/29. Phone rang multiple times and no answer; no voicemail.

## 2016-10-24 NOTE — Telephone Encounter (Signed)
Spoke with patient; gave her new appointment time for 3/29 visit. Patient verbalized understanding.

## 2016-10-26 ENCOUNTER — Other Ambulatory Visit (HOSPITAL_BASED_OUTPATIENT_CLINIC_OR_DEPARTMENT_OTHER): Payer: Medicare Other

## 2016-10-26 ENCOUNTER — Ambulatory Visit (HOSPITAL_BASED_OUTPATIENT_CLINIC_OR_DEPARTMENT_OTHER): Payer: Medicare Other

## 2016-10-26 ENCOUNTER — Encounter: Payer: Self-pay | Admitting: Hematology and Oncology

## 2016-10-26 ENCOUNTER — Ambulatory Visit (HOSPITAL_BASED_OUTPATIENT_CLINIC_OR_DEPARTMENT_OTHER): Payer: Medicare Other | Admitting: Hematology and Oncology

## 2016-10-26 ENCOUNTER — Other Ambulatory Visit: Payer: Medicare Other

## 2016-10-26 ENCOUNTER — Ambulatory Visit: Payer: Medicare Other

## 2016-10-26 ENCOUNTER — Other Ambulatory Visit: Payer: Self-pay

## 2016-10-26 DIAGNOSIS — C50912 Malignant neoplasm of unspecified site of left female breast: Secondary | ICD-10-CM

## 2016-10-26 DIAGNOSIS — C50011 Malignant neoplasm of nipple and areola, right female breast: Secondary | ICD-10-CM

## 2016-10-26 DIAGNOSIS — Z5111 Encounter for antineoplastic chemotherapy: Secondary | ICD-10-CM

## 2016-10-26 DIAGNOSIS — D6481 Anemia due to antineoplastic chemotherapy: Secondary | ICD-10-CM | POA: Diagnosis not present

## 2016-10-26 DIAGNOSIS — G620 Drug-induced polyneuropathy: Secondary | ICD-10-CM | POA: Diagnosis not present

## 2016-10-26 DIAGNOSIS — D701 Agranulocytosis secondary to cancer chemotherapy: Secondary | ICD-10-CM

## 2016-10-26 DIAGNOSIS — C50919 Malignant neoplasm of unspecified site of unspecified female breast: Secondary | ICD-10-CM

## 2016-10-26 DIAGNOSIS — Z170 Estrogen receptor positive status [ER+]: Secondary | ICD-10-CM | POA: Diagnosis not present

## 2016-10-26 DIAGNOSIS — C7951 Secondary malignant neoplasm of bone: Secondary | ICD-10-CM

## 2016-10-26 DIAGNOSIS — C50911 Malignant neoplasm of unspecified site of right female breast: Secondary | ICD-10-CM

## 2016-10-26 DIAGNOSIS — C50012 Malignant neoplasm of nipple and areola, left female breast: Secondary | ICD-10-CM

## 2016-10-26 LAB — COMPREHENSIVE METABOLIC PANEL
ALBUMIN: 3 g/dL — AB (ref 3.5–5.0)
ALK PHOS: 145 U/L (ref 40–150)
ALT: 20 U/L (ref 0–55)
ANION GAP: 9 meq/L (ref 3–11)
AST: 51 U/L — ABNORMAL HIGH (ref 5–34)
BILIRUBIN TOTAL: 0.55 mg/dL (ref 0.20–1.20)
BUN: 12.9 mg/dL (ref 7.0–26.0)
CALCIUM: 9.9 mg/dL (ref 8.4–10.4)
CHLORIDE: 101 meq/L (ref 98–109)
CO2: 29 mEq/L (ref 22–29)
CREATININE: 0.9 mg/dL (ref 0.6–1.1)
EGFR: 82 mL/min/{1.73_m2} — ABNORMAL LOW (ref >90)
Glucose: 157 mg/dl — ABNORMAL HIGH (ref 70–140)
Potassium: 3.8 mEq/L (ref 3.5–5.1)
Sodium: 139 mEq/L (ref 136–145)
Total Protein: 7.9 g/dL (ref 6.4–8.3)

## 2016-10-26 LAB — CBC WITH DIFFERENTIAL/PLATELET
BASO%: 0.6 % (ref 0.0–2.0)
BASOS ABS: 0.1 10*3/uL (ref 0.0–0.1)
EOS%: 1.1 % (ref 0.0–7.0)
Eosinophils Absolute: 0.2 10*3/uL (ref 0.0–0.5)
HEMATOCRIT: 29 % — AB (ref 34.8–46.6)
HGB: 9.5 g/dL — ABNORMAL LOW (ref 11.6–15.9)
LYMPH#: 1.7 10*3/uL (ref 0.9–3.3)
LYMPH%: 9.4 % — ABNORMAL LOW (ref 14.0–49.7)
MCH: 23.7 pg — ABNORMAL LOW (ref 25.1–34.0)
MCHC: 32.7 g/dL (ref 31.5–36.0)
MCV: 72.6 fL — ABNORMAL LOW (ref 79.5–101.0)
MONO#: 1.7 10*3/uL — AB (ref 0.1–0.9)
MONO%: 9.3 % (ref 0.0–14.0)
NEUT#: 14.8 10*3/uL — ABNORMAL HIGH (ref 1.5–6.5)
NEUT%: 79.6 % — AB (ref 38.4–76.8)
PLATELETS: 269 10*3/uL (ref 145–400)
RBC: 3.99 10*6/uL (ref 3.70–5.45)
RDW: 20.7 % — ABNORMAL HIGH (ref 11.2–14.5)
WBC: 18.6 10*3/uL — ABNORMAL HIGH (ref 3.9–10.3)

## 2016-10-26 MED ORDER — PALONOSETRON HCL INJECTION 0.25 MG/5ML
0.2500 mg | Freq: Once | INTRAVENOUS | Status: AC
  Administered 2016-10-26: 0.25 mg via INTRAVENOUS

## 2016-10-26 MED ORDER — CARBOPLATIN CHEMO INTRADERMAL TEST DOSE 100MCG/0.02ML
100.0000 ug | Freq: Once | INTRADERMAL | Status: AC
  Administered 2016-10-26: 100 ug via INTRADERMAL
  Filled 2016-10-26: qty 0.02

## 2016-10-26 MED ORDER — ZOLPIDEM TARTRATE 10 MG PO TABS: 10.0000 mg | ORAL_TABLET | Freq: Every day | ORAL | 2 refills | Status: DC

## 2016-10-26 MED ORDER — SODIUM CHLORIDE 0.9% FLUSH
10.0000 mL | INTRAVENOUS | Status: DC | PRN
Start: 2016-10-26 — End: 2016-10-26
  Administered 2016-10-26: 10 mL
  Filled 2016-10-26: qty 10

## 2016-10-26 MED ORDER — DEXAMETHASONE SODIUM PHOSPHATE 10 MG/ML IJ SOLN
INTRAMUSCULAR | Status: AC
  Filled 2016-10-26: qty 1

## 2016-10-26 MED ORDER — SODIUM CHLORIDE 0.9 % IV SOLN
550.0000 mg/m2 | Freq: Once | INTRAVENOUS | Status: AC
  Administered 2016-10-26: 912 mg via INTRAVENOUS
  Filled 2016-10-26: qty 23.99

## 2016-10-26 MED ORDER — PALONOSETRON HCL INJECTION 0.25 MG/5ML
INTRAVENOUS | Status: AC
  Filled 2016-10-26: qty 5

## 2016-10-26 MED ORDER — SODIUM CHLORIDE 0.9 % IV SOLN
442.0000 mg | Freq: Once | INTRAVENOUS | Status: AC
  Administered 2016-10-26: 440 mg via INTRAVENOUS
  Filled 2016-10-26: qty 44

## 2016-10-26 MED ORDER — SODIUM CHLORIDE 0.9 % IV SOLN
Freq: Once | INTRAVENOUS | Status: AC
  Administered 2016-10-26: 13:00:00 via INTRAVENOUS

## 2016-10-26 MED ORDER — DEXAMETHASONE SODIUM PHOSPHATE 10 MG/ML IJ SOLN
10.0000 mg | Freq: Once | INTRAMUSCULAR | Status: AC
  Administered 2016-10-26: 10 mg via INTRAVENOUS

## 2016-10-26 MED ORDER — HEPARIN SOD (PORK) LOCK FLUSH 100 UNIT/ML IV SOLN
500.0000 [IU] | Freq: Once | INTRAVENOUS | Status: AC | PRN
  Administered 2016-10-26: 500 [IU]
  Filled 2016-10-26: qty 5

## 2016-10-26 NOTE — Progress Notes (Signed)
Patient Care Team: [name], MD (Inactive) as PCP - General (Family Medicine) [name], MD (Cardiology)  DIAGNOSIS:  Encounter Diagnoses  Name Primary?  . 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 Bone metastasis ([place])     SUMMARY OF ONCOLOGIC HISTORY:   Bilateral breast cancer ([place])   07/23/2013 Mammogram    Bilateral breast masses. 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 N1 M0 stage IIB ER 100%, PR 0%, HER-2       06/17/2014 -  Radiation Therapy    Adjuvant radiation therapy      09/14/2014 -  Anti-estrogen oral therapy    Anastrozole 1 mg daily      05/28/2015 Imaging    CT scans: Enlarging subpectoral masses 3.1 x 3.5 cm, posteriorly lower density mass 4.5 x 2.1 cm, several right-sided lung nodules right lower lobe 1.4 cm, 3 other right lung nodules, 1.7 cm right iliac bone lesion      06/21/2015 Procedure    Left  subpectoral mass biopsy: Invasive high-grade ductal carcinoma ER 5%, PR 0%, HER-2 negative ratio 1.29      09/01/2015 Imaging    Left chest wall mass increased in size 7.2 x 5.1 cm, multiple subcutaneous nodules, increase in the lung nodules both in number as well as in the size of existing nodules      09/10/2015 -  Chemotherapy    Carboplatin, gemcitabine days 1 and 8 q 3 weeks, carboplatin discontinued for neuropathy (treatment break from 01/20/2016 to 03/24/2016); Added Carboplatin back 04/14/16 (for progression)      01/26/2016 Imaging    Marked improvement in size of lung nodules with many of the nodules resolved index right lower lobe nodule 1.9 x 1.8 cm is now 0.7 x 0.6 cm, reduction in the size of left breast mass 7.2 cm down to 2.6 cm, enlargement in the hypodense mass in the uterus      04/06/2016 Imaging    CT chest: Interval progression of pre-existing lung nodules new lung metastases (59m, 177m 2.9 cm, 1.6 cm), interval progression of disease in the left breast 4.2 cm (was 2.6 cm), additional nodules 2.3 cm and 3.2 cm; CT head: scalp mass 1.9 cm      06/27/2016 Imaging    Ct chest: Stable lesions in lt breast deep to Left pectoralis,  cystic lesion loc ant in left breast inc compared to previous; decrease in lung nodules (10m to 3 mm; 12 mm to 7 mm, RML nodule 2.9 cm to 1.8 cm, RLL 145mto 7 mm)      CHIEF COMPLIANT: Carboplatin and Gemcitabine palliative chemotherapy  INTERVAL HISTORY: Paula BOVEs a [age]ear old with above-mentioned history metastatic breast cancer on palliative chemotherapy with gemcitabine. She has been tolerating chemotherapy fairly well. She complains of the mass on the left chest wall causes her pain. However when she takes oxycodone she gets severely constipated and she does not want take that anymore. She has been taking ibuprofen it appears to be reasonably helping her. For the constipation she had to take many over-the-counter medications and finally  she was able to clear it. She also complains of a rash on her forearm that started a week ago. It is accompanied by itching. She is scheduled to undergo surgery on the scalp in a few days. She wants to have treatment for her persistent uterine bleeding as well as some recent rectal bleeding I suspect is related to severe constipation.  REVIEW OF SYSTEMS:   Constitutional: Denies fevers, chills or abnormal weight loss Eyes: Denies blurriness of vision Ears, nose, mouth, throat, and face: Denies mucositis or sore throat Respiratory: Denies cough, dyspnea or wheezes, large left chest wall mass is tender to palpation Cardiovascular: Denies palpitation, chest discomfort Gastrointestinal:  Denies nausea, heartburn or change in bowel habits Skin: Rash on the right forearm Lymphatics: Denies new lymphadenopathy or easy bruising Neurological:Denies numbness, tingling or new weaknesses Behavioral/Psych: Mood is stable, no new changes  Extremities: No lower extremity edema  All other systems were reviewed with the patient and are negative.  I have reviewed the past medical history, past surgical history, social history and family history with the patient and they are unchanged from previous note.  ALLERGIES:  is allergic to codeine and morphine and related.  MEDICATIONS:  Current Outpatient Prescriptions  Medication Sig Dispense Refill  . B Complex-C (SUPER B COMPLEX PO) Take 1 tablet by mouth daily.    . calcium-vitamin D (OSCAL-500) 500-400 MG-UNIT tablet Take 1 tablet by mouth 2 (two) times daily. 60 tablet 3  . docusate sodium (COLACE) 100 MG capsule Take 1 capsule (100 mg total) by mouth every 12 (twelve) hours. 60 capsule 0  . gabapentin (NEURONTIN) 300 MG capsule TAKE 2 CAPSULES (600 MG TOTAL) BY MOUTH 3 (THREE) TIMES DAILY. 180 capsule 2  . lidocaine-prilocaine (EMLA) cream Apply 1 application topically as needed. 30 g 6  . LORazepam (ATIVAN) 1 MG tablet Take 1 tablet (1 mg total) by mouth  at bedtime. 30 tablet 0  . nystatin (MYCOSTATIN) 100000 UNIT/ML suspension Take 5 mLs (500,000 Units total) by mouth 4 (four) times daily. 60 mL 0  . ondansetron (ZOFRAN) 4 MG tablet Take 1 tablet (4 mg total) by mouth every 8 (eight) hours as needed for nausea or vomiting. 30 tablet 0  . oxyCODONE-acetaminophen (PERCOCET/ROXICET) 5-325 MG tablet 1 tabs PO q6h prn pain 60 tablet 0  . prochlorperazine (COMPAZINE) 10 MG tablet TAKE 1 TABLET EVERY 6 HOURS AS NEEDED NAUEA OR VOMITING 30 tablet 1  . traZODone (DESYREL) 50 MG tablet Take 1 tablet (50 mg total) by mouth at bedtime. 7 tablet 0  . zolpidem (AMBIEN) 10 MG tablet Take 1 tablet (10 mg total) by mouth at bedtime. 30 tablet 2   No current facility-administered medications for this visit.    Facility-Administered  Medications Ordered in Other Visits  Medication Dose Route Frequency Provider Last Rate Last Dose  . heparin lock flush 100 unit/mL  500 Units Intracatheter Once PRN Nicholas Lose, MD      . sodium chloride flush (NS) 0.9 % injection 10 mL  10 mL Intracatheter PRN Nicholas Lose, MD   10 mL at 03/31/16 1352  . sodium chloride flush (NS) 0.9 % injection 10 mL  10 mL Intracatheter PRN Nicholas Lose, MD        PHYSICAL EXAMINATION: ECOG PERFORMANCE STATUS: 1 - Symptomatic but completely ambulatory  Vitals:   10/26/16 1206  BP: 132/71  Pulse: 88  Resp: 18  Temp: 98 F (36.7 C)   Filed Weights   10/26/16 1206  Weight: 121 lb 12.8 oz (55.2 kg)    GENERAL:alert, no distress and comfortable SKIN:Maculopapular rash about 4 lesions in the right forearm that do not appear to be shingles EYES: normal, Conjunctiva are pink and non-injected, sclera clear OROPHARYNX:no exudate, no erythema and lips, buccal mucosa, and tongue normal  NECK: supple, thyroid normal size, non-tender, without nodularity LYMPH:  no palpable lymphadenopathy in the cervical, axillary or inguinal LUNGS: clear to auscultation and percussion with normal breathing  effort HEART: regular rate & rhythm and no murmurs and no lower extremity edema ABDOMEN:abdomen soft, non-tender and normal bowel sounds MUSCULOSKELETAL:no cyanosis of digits and no clubbing  NEURO: alert & oriented x 3 with fluent speech, no focal motor/sensory deficits EXTREMITIES: No lower extremity edema BREAST:Large left chest wall mass is tender to palpation. (exam performed in the presence of a chaperone)  LABORATORY DATA:  I have reviewed the data as listed   Chemistry      Component Value Date/Time   NA 139 10/26/2016 1114   K 3.8 10/26/2016 1114   CL 102 06/09/2016 0110   CO2 29 10/26/2016 1114   BUN 12.9 10/26/2016 1114   CREATININE 0.9 10/26/2016 1114      Component Value Date/Time   CALCIUM 9.9 10/26/2016 1114   ALKPHOS 145 10/26/2016 1114   AST 51 (H) 10/26/2016 1114   ALT 20 10/26/2016 1114   BILITOT 0.55 10/26/2016 1114       Lab Results  Component Value Date   WBC 18.6 (H) 10/26/2016   HGB 9.5 (L) 10/26/2016   HCT 29.0 (L) 10/26/2016   MCV 72.6 (L) 10/26/2016   PLT 269 10/26/2016   NEUTROABS 14.8 (H) 10/26/2016    ASSESSMENT & PLAN:  Bilateral breast cancer ([place]) Treated with neoadjuvant chemotherapy followed by surgery 04/08/2014, radiation treatment completed 07/17/2014, antiestrogen therapy with anastrozole 09/14/2014 Left breastT2, N0, M0, IDC grade 3; 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 N1 M0 stage IIB Metastatic diseasediagnosed 05/28/15 (subpectoral masses 3.5 cm and 4.5 cm. Four lung nodules the largest 1.4 cm in the right lung 1.7 cm sclerotic right iliac bone lesion) Left subpectoral mass biopsy 06/21/2015: Invasive high-grade ductal carcinoma ER 5%, PR 0%, HER-2 negative ratio 1.29 Prior treatment:  1. Xeloda 1500 mg by mouth twice a day started 07/07/2015 stopped 09/03/2015  (stopped for progression) 2. Carboplatin day 1 and gemcitabine days 1 and 8 every 3 weeks started 09/10/2015; completed cycle 5 01/20/2016 (carboplatin discontinued for neuropathy) ------------------------------------------------------------------------------------------------------------------------------------------ Current treatment: Carboplatin and gemcitabine (carboplatin added because of progression of disease 04/14/2016), Thisis cycle 12 day 8 (Chemotherapy  being given on day 1, 8q 3 weeks)with neulasta  Chemotherapy toxicities: 1. Fatigue 2. Anemia due to chemotherapy: hgb improved mildly to 9.0 3. Severe leukocytosis with neutrophils markedly increased due to Neulasta: Neulasta discontinued , will resume Neulasta because of neutropenia 4. Grade 2 neuropathy  Pain issues: Oxycontin causes constipation. Discontinued it Insomnia: Prescribed Ambien Lymphedema: Continue to wear compression sleeves daily. Patient does not use it often Bone mets: Xgeva q 6 weeks with calcium and Vit D CT chest 06/27/16: Stable lesions in lt breast and deep to Left pectoralis, cystic lesion loc ant in left breast with enh nodular comp which inc compared to previous  Vaginal bleeding: Patient will make an appointment with Dr. Garwin Brothers Scalp cyst could be a sebaceous cyst: Dr. Marlou Starks is planning on surgery on the cyst.   Bone scan02/21/2018: Asymmetric uptake at the level of the tip of the left scapula or within an adjacent rib. No evidence of active bone metastases elsewhere. We will treat her again on 11/16/2016.  Return to clinic in 3 weeks for next cycle. She will not get day 8 of the cycle in preparation for the surgery.   I spent 25 minutes talking to the patient of which more than half was spent in counseling and coordination of care.  No orders of the defined types were placed in this encounter.  The patient has a good understanding of the overall plan. she agrees with it. she will  call with any problems that may develop before the next visit here.   Rulon Eisenmenger, MD 10/26/16

## 2016-10-26 NOTE — Patient Instructions (Signed)

## 2016-10-26 NOTE — Assessment & Plan Note (Signed)
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 N1 M0 stage IIB Metastatic diseasediagnosed 05/28/15 (subpectoral masses 3.5 cm and 4.5 cm. Four lung nodules the largest 1.4 cm in the right lung 1.7 cm sclerotic right iliac bone lesion) Left subpectoral mass biopsy 06/21/2015: Invasive high-grade ductal carcinoma ER 5%, PR 0%, HER-2 negative ratio 1.29 Prior treatment:  1. Xeloda 1500 mg by mouth twice a day started 07/07/2015 stopped 09/03/2015 (stopped for progression) 2. Carboplatin day 1 and gemcitabine days 1 and 8 every 3 weeks started 09/10/2015; completed cycle 5 01/20/2016 (carboplatin discontinued for neuropathy) ------------------------------------------------------------------------------------------------------------------------------------------ Current treatment: Carboplatin and gemcitabine (carboplatin added because of progression of disease 04/14/2016), Thisis cycle 12 day 8 (Chemotherapy being given on day 1, 8q 3 weeks)with neulasta  Chemotherapy toxicities: 1. Fatigue 2. Anemia due to chemotherapy: hgb improved mildly to 9.0 3. Severe leukocytosis with neutrophils markedly increased due to Neulasta: Neulasta discontinued , will resume Neulasta because of neutropenia 4. Grade 2 neuropathy  Pain issues: Oxycontin for long acting pain medication. Pain well controlled Insomnia: D/Ced Ativan. Given trazodone Lymphedema: Continue to wear compression sleeves daily. Patient does not use it often Bone mets: Xgeva q 6 weeks with calcium and Vit D CT chest 06/27/16: Stable lesions in lt breast and deep to Left pectoralis,  cystic lesion loc ant in left breast with enh nodular comp which inc compared to previous  Vaginal bleeding: Patient is contemplating on doing a D&C with Dr. Garwin Brothers Scalp cyst could be a sebaceous cyst: Dr. Marlou Starks is planning on surgery on the cyst. We will adjust her treatment so that she can get the surgery done.  Bone scan02/21/2018: Asymmetric uptake at the level of the tip of the left scapula or within an adjacent rib. No evidence of active bone metastases elsewhere. We will treat her again on 11/16/2016.  Return to clinic in 3 weeks for next cycle

## 2016-10-27 ENCOUNTER — Emergency Department (HOSPITAL_COMMUNITY)
Admission: EM | Admit: 2016-10-27 | Discharge: 2016-10-27 | Disposition: A | Discharge status: Home / Self Care | Payer: Medicare Other | Attending: Emergency Medicine | Admitting: Emergency Medicine

## 2016-10-27 DIAGNOSIS — N390 Urinary tract infection, site not specified: Secondary | ICD-10-CM | POA: Insufficient documentation

## 2016-10-27 DIAGNOSIS — Z85830 Personal history of malignant neoplasm of bone: Secondary | ICD-10-CM | POA: Diagnosis not present

## 2016-10-27 DIAGNOSIS — J449 Chronic obstructive pulmonary disease, unspecified: Secondary | ICD-10-CM | POA: Insufficient documentation

## 2016-10-27 DIAGNOSIS — Z853 Personal history of malignant neoplasm of breast: Secondary | ICD-10-CM | POA: Insufficient documentation

## 2016-10-27 DIAGNOSIS — Z79899 Other long term (current) drug therapy: Secondary | ICD-10-CM | POA: Diagnosis not present

## 2016-10-27 DIAGNOSIS — R197 Diarrhea, unspecified: Secondary | ICD-10-CM | POA: Insufficient documentation

## 2016-10-27 DIAGNOSIS — Z87891 Personal history of nicotine dependence: Secondary | ICD-10-CM | POA: Diagnosis not present

## 2016-10-27 DIAGNOSIS — R109 Unspecified abdominal pain: Secondary | ICD-10-CM | POA: Diagnosis present

## 2016-10-27 DIAGNOSIS — Z85118 Personal history of other malignant neoplasm of bronchus and lung: Secondary | ICD-10-CM | POA: Diagnosis not present

## 2016-10-27 DIAGNOSIS — K591 Functional diarrhea: Secondary | ICD-10-CM

## 2016-10-27 LAB — URINALYSIS, ROUTINE W REFLEX MICROSCOPIC
Bilirubin Urine: NEGATIVE
GLUCOSE, UA: NEGATIVE mg/dL
Ketones, ur: NEGATIVE mg/dL
NITRITE: NEGATIVE
PH: 5 (ref 5.0–8.0)
Protein, ur: NEGATIVE mg/dL
SPECIFIC GRAVITY, URINE: 1.009 (ref 1.005–1.030)

## 2016-10-27 MED ORDER — CEPHALEXIN 500 MG PO CAPS: 500.0000 mg | ORAL_CAPSULE | Freq: Four times a day (QID) | ORAL | 0 refills | Status: DC

## 2016-10-27 MED ORDER — DOCUSATE SODIUM 100 MG PO CAPS: 100.0000 mg | ORAL_CAPSULE | Freq: Two times a day (BID) | ORAL | 0 refills | Status: DC

## 2016-10-27 MED ORDER — DOCUSATE SODIUM 100 MG PO CAPS
100.0000 mg | ORAL_CAPSULE | Freq: Once | ORAL | Status: AC
  Administered 2016-10-27: 100 mg via ORAL
  Filled 2016-10-27: qty 1

## 2016-10-27 MED ORDER — DICYCLOMINE HCL 10 MG PO CAPS
10.0000 mg | ORAL_CAPSULE | Freq: Once | ORAL | Status: AC
  Administered 2016-10-27: 10 mg via ORAL
  Filled 2016-10-27: qty 1

## 2016-10-27 MED ORDER — CEPHALEXIN 500 MG PO CAPS
500.0000 mg | ORAL_CAPSULE | Freq: Once | ORAL | Status: AC
  Administered 2016-10-27: 500 mg via ORAL
  Filled 2016-10-27: qty 1

## 2016-10-27 NOTE — ED Notes (Signed)
Pt has drank 3 (8 oz) cups of water.

## 2016-10-27 NOTE — ED Notes (Signed)
ED Provider at bedside. 

## 2016-10-27 NOTE — ED Provider Notes (Signed)
[facility] DEPT Provider Note   CSN: [id number] Arrival date & time: 10/27/16  1909     History   Chief Complaint Chief Complaint  Patient presents with  . Abdominal Pain    HPI Paula Navarro is a 66 y.o. female.  HPI  Patient is on chronic necrotic pain medication and has constipation secondary to that. She took magnesium citrate yesterday and she had multiple episodes of diarrhea afterwards and subsequent started having crampy intermittent abdominal pain. Comes every hour to last a few minutes at a time and then goes away. She is asymptomatic in between. No nausea or vomiting. No blood in her stools. No fevers. She has had increased urinary frequency but no dysuria or malodorous urine. No other modifying or associated symptoms.  Past Medical History:  Diagnosis Date  . Anemia   . Anxiety   . Atrial septal defect 1996   Surgical repair in 1996  . Breast cancer ([place])   . Chest pain    Admitted to [facility] in 09/2011; refused stress test  . Chronic bronchitis   . Chronic pain   . COPD (chronic obstructive pulmonary disease) ([place])    on xray  . Palpitation    Tachycardia reported by monitor clerk during a symptomatic spell  . Radiation 06/30/14-08/17/14   Bilateral Breast  . Tobacco abuse    60 pack years; 1.5 packs per day  . Wears dentures    top    Patient Active Problem List   Diagnosis Date Noted  . 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 Chemotherapy-induced thrombocytopenia 06/13/2016  . Scalp lesion 04/01/2016  . Encounter for central line care 12/08/2015  . Port catheter in place 11/19/2015  . Insomnia 11/01/2015  . Microcytic anemia 09/28/2015  . Lung metastases ([place]) 09/10/2015  . Bone metastases ([place]) 07/20/2015  . Encounter for chemotherapy management 07/13/2015  . Metastatic breast cancer ([place]) 06/29/2015  . Chronic pain 03/16/2015  . Vaginal bleeding 09/24/2014  . Postherpetic neuralgia 09/03/2014  . Lymphedema 09/03/2014  .  Suspected herpes zoster left C5 distribution 08/14/2014  . Neuropathy due to chemotherapeutic drug ([place]) 03/03/2014  . Anxiety 03/03/2014  . Bilateral breast cancer ([place]) 08/11/2013  . Palpitation   . Chest pain   . Atrial septal defect   . Laboratory test 11/25/2011  . Chronic bronchitis   . Tobacco abuse     Past Surgical History:  Procedure Laterality Date  . ASD REPAIR, OSTIUM PRIMUM  1996   dr Roxy Horseman  . AXILLARY LYMPH NODE DISSECTION Bilateral 04/08/2014   Procedure:  BILATERAL AXILLARY LYMPH NODE DISSECTION;  Surgeon: Autumn Messing III, MD;  Location: [facility];  Service: General;  Laterality: Bilateral;  . BREAST BIOPSY Bilateral   . BREAST LUMPECTOMY WITH RADIOACTIVE SEED LOCALIZATION Bilateral 04/08/2014   Procedure: BILATERAL  RADIOACTIVE SEED LOCALIZATION LUMPECTOMY ;  Surgeon: Autumn Messing III, MD;  Location: [facility];  Service: General;  Laterality: Bilateral;  . CESAREAN SECTION     x3  . CHOLECYSTECTOMY    . open heart surgery    . PORT A CATH REVISION  1/15   put in   . TUBAL LIGATION      OB History    Gravida Para Term Preterm AB Living   '4 3 3         '$ SAB TAB Ectopic Multiple Live Births  Home Medications    Prior to Admission medications   Medication Sig Start Date End Date Taking? Authorizing Provider  B Complex-C (SUPER B COMPLEX PO) Take 1 tablet by mouth daily.   Yes Historical Provider, MD  calcium-vitamin D (OSCAL-500) 500-400 MG-UNIT tablet Take 1 tablet by mouth 2 (two) times daily. 08/18/15  Yes Nicholas Lose, MD  gabapentin (NEURONTIN) 300 MG capsule TAKE 2 CAPSULES (600 MG TOTAL) BY MOUTH 3 (THREE) TIMES DAILY. 08/30/16  Yes Nicholas Lose, MD  lidocaine-prilocaine (EMLA) cream Apply 1 application topically as needed. 10/12/16  Yes Nicholas Lose, MD  LORazepam (ATIVAN) 1 MG tablet Take 1 tablet (1 mg total) by mouth at bedtime. 10/12/16  Yes Nicholas Lose, MD  nystatin (MYCOSTATIN) 100000 UNIT/ML suspension  Take 5 mLs (500,000 Units total) by mouth 4 (four) times daily. 08/03/16  Yes Nicholas Lose, MD  ondansetron (ZOFRAN) 4 MG tablet Take 1 tablet (4 mg total) by mouth every 8 (eight) hours as needed for nausea or vomiting. 07/28/16  Yes Nicholas Lose, MD  prochlorperazine (COMPAZINE) 10 MG tablet TAKE 1 TABLET EVERY 6 HOURS AS NEEDED NAUEA OR VOMITING 10/12/16  Yes Nicholas Lose, MD  zolpidem (AMBIEN) 10 MG tablet Take 1 tablet (10 mg total) by mouth at bedtime. 10/26/16  Yes Nicholas Lose, MD  cephALEXin (KEFLEX) 500 MG capsule Take 1 capsule (500 mg total) by mouth 4 (four) times daily. 10/27/16   Merrily Pew, MD    Family History Family History  Problem Relation Age of Onset  . Breast cancer Maternal Aunt     Social History Social History  Substance Use Topics  . Smoking status: Former Smoker    Packs/day: 1.50    Years: 40.00    Types: Cigarettes    Quit date: 08/01/2012  . Smokeless tobacco: Never Used  . Alcohol use No     Allergies   Codeine and Morphine and related   Review of Systems Review of Systems  All other systems reviewed and are negative.    Physical Exam Updated Vital Signs BP 134/76   Pulse 75   Temp 99.4 F (37.4 C) (Oral)   Resp 17   Wt 121 lb (54.9 kg)   LMP 05/20/2011   SpO2 99%   BMI 20.77 kg/m   Physical Exam  Constitutional: She is oriented to person, place, and time. She appears well-developed and well-nourished.  HENT:  Head: Normocephalic and atraumatic.  Eyes: Conjunctivae and EOM are normal.  Neck: Normal range of motion.  Cardiovascular: Normal rate and regular rhythm.   Pulmonary/Chest: Effort normal. No stridor. No respiratory distress.  Abdominal: Soft. She exhibits no distension.  Musculoskeletal: Normal range of motion. She exhibits no edema or deformity.  Neurological: She is alert and oriented to person, place, and time. No cranial nerve deficit. Coordination normal.  Skin: Skin is warm and dry. No erythema. No pallor.  Nursing  note and vitals reviewed.    ED Treatments / Results  Labs (all labs ordered are listed, but only abnormal results are displayed) Labs Reviewed  URINALYSIS, ROUTINE W REFLEX MICROSCOPIC - Abnormal; Notable for the following:       Result Value   APPearance HAZY (*)    Hgb urine dipstick LARGE (*)    Leukocytes, UA TRACE (*)    Bacteria, UA RARE (*)    Squamous Epithelial / LPF 0-5 (*)    All other components within normal limits  URINE CULTURE    EKG  EKG Interpretation None  Radiology No results found.  Procedures Procedures (including critical care time)  Medications Ordered in ED Medications  dicyclomine (BENTYL) capsule 10 mg (10 mg Oral Given 10/27/16 1957)  docusate sodium (COLACE) capsule 100 mg (100 mg Oral Given 10/27/16 1957)  cephALEXin (KEFLEX) capsule 500 mg (500 mg Oral Given 10/27/16 2107)     Initial Impression / Assessment and Plan / ED Course  I have reviewed the triage vital signs and the nursing notes.  Pertinent labs & imaging results that were available during my care of the patient were reviewed by me and considered in my medical decision making (see chart for details).    Likely patient symptoms related to increased GI motility from magnesium citrate. Plan for bentyl and UA. Patient doesn't want blood drawn, so will cancel screening labs. Tolerating PO fluids on my exam.  If UA clear, plan for discharge.  Bentyl helped considerable. UA with possible infection. Since she has had some urinary symptoms, will err on side of caution and start abx. otherwise will need pcp follow up to ensure symptoms improe.   Final Clinical Impressions(s) / ED Diagnoses   Final diagnoses:  Abdominal pain, unspecified abdominal location  Lower urinary tract infectious disease  Functional diarrhea    New Prescriptions New Prescriptions   CEPHALEXIN (KEFLEX) 500 MG CAPSULE    Take 1 capsule (500 mg total) by mouth 4 (four) times daily.     Merrily Pew,  MD 10/27/16 2116

## 2016-10-29 LAB — URINE CULTURE

## 2016-10-31 ENCOUNTER — Telehealth: Payer: Self-pay

## 2016-10-31 NOTE — Telephone Encounter (Signed)
Threasa Beards- can you give her a follow up new appt? Thanks, Bryson Ha

## 2016-10-31 NOTE — Telephone Encounter (Signed)
Pt called requesting a call back  She requested we ask Dr Lindi Adie if we can start some radiation treatment now. She had been offered in the past but her mom was sick at that time. She had also talked with Dr Lisbeth Renshaw at that time. "The tumors are giving her a fit" with pain. They are swollen. They are in L chest wall. Sore all the time. Skin is turning a dark color.

## 2016-11-06 ENCOUNTER — Telehealth: Payer: Self-pay

## 2016-11-06 ENCOUNTER — Other Ambulatory Visit: Payer: Self-pay

## 2016-11-06 ENCOUNTER — Telehealth: Payer: Self-pay | Admitting: Medical Oncology

## 2016-11-06 DIAGNOSIS — C50919 Malignant neoplasm of unspecified site of unspecified female breast: Secondary | ICD-10-CM

## 2016-11-06 NOTE — Telephone Encounter (Signed)
"  I just missed a call."  Reached voicemail with transfer.  "I'll leave the nurse a message."

## 2016-11-06 NOTE — Telephone Encounter (Signed)
Thank you. Will follow up with pt in a few days

## 2016-11-06 NOTE — Telephone Encounter (Signed)
"  I called earlier and waiting for a prescription of something for abdominal pain.  It needs to be called to [facility] in [place] on [address].  My lower part of my abdomen hurts now.  I also ran out of ativan a month ago and you all need to call this in as well.  Stomach pain started two weeks ago with magnesium.  I now take Mira lax.  My bowels move but something's not right.  Abdomen pain feels better when bowels move then I hurt all over again.  Sharp pains in different places, pain moves so it just hurts all over.  I feel weak.  This morning had a soft BM, yesterday evening had a BM.  Pass gas at times.  Abdomen always looks swollen, feels soft and firm, sometimes it hurts when I touch it, I'm in bed more due to pain, no nausea.  I drink four eight ounce water, not eating a lot."  Will notify provider abdomen pain is not resolved and requests ativan refill for nerves.

## 2016-11-06 NOTE — Telephone Encounter (Signed)
Pt called in, states that she would like to come in to get evaluated for her persistent abdominal lower quadrant cramping. Pt states " I have had this abdominal pain that is sharp and cramping for 2 weeks." Pt recently finished antibiotic treatment for UTI for 2 weeks. Pt states that she feels really dry and dehydrated. She also states that her fatigue level had been really bad in the last few weeks. She is eating and drinking fairly, but states that she is still feeling dehydrated. Pt was taking mag citrate for consitpation and had been experiencing abdominal pain. She had switched to taking miralax  and took kaopectate x1 yesterday and had mild relief from abdominal pain. Advised pt to eat in smaller meals and to stay away from dairy, greasy, spicy or acidic foods to help with abdominal pain. Encourage pt to increase fluid intake throughout the day and stay away from caffeine drinks.Denies n/v/d and bleeding. Will have pt come in tomorrow and see Lindsey,NP. Will also add labs to make sure that her UTI is clear from recent antibiotic therapy, since pt was having some issues with urination as well. Notified Lindsey,NP and is aware.

## 2016-11-06 NOTE — Telephone Encounter (Signed)
Returned her call. "My stomach is giving me a fit" -described as cramping.Had 2 BM in past 4 days LBM this am -"soft" -after administering  enema on Sunday . Took kaopectate this am and she states it helped a little with  cramping. That is all she is taking for pain.  ( She was in ED 2 weeks ago for constipation,states she was started on antibiotic and just finished it yesterday. I told her it may be antibiotic causing cramping ). I told her to monitor for now and to call back if it doesn't get any better.

## 2016-11-07 ENCOUNTER — Telehealth: Payer: Self-pay

## 2016-11-07 ENCOUNTER — Ambulatory Visit (HOSPITAL_COMMUNITY)
Admission: RE | Admit: 2016-11-07 | Discharge: 2016-11-07 | Disposition: A | Discharge status: Home / Self Care | Payer: Medicare Other | Source: Ambulatory Visit | Attending: Adult Health | Admitting: Adult Health

## 2016-11-07 ENCOUNTER — Other Ambulatory Visit: Payer: Self-pay

## 2016-11-07 ENCOUNTER — Encounter (HOSPITAL_BASED_OUTPATIENT_CLINIC_OR_DEPARTMENT_OTHER): Payer: Self-pay | Admitting: *Deleted

## 2016-11-07 ENCOUNTER — Encounter: Payer: Self-pay | Admitting: Adult Health

## 2016-11-07 ENCOUNTER — Other Ambulatory Visit (HOSPITAL_BASED_OUTPATIENT_CLINIC_OR_DEPARTMENT_OTHER): Payer: Medicare Other

## 2016-11-07 ENCOUNTER — Ambulatory Visit: Payer: Medicare Other

## 2016-11-07 ENCOUNTER — Ambulatory Visit (HOSPITAL_BASED_OUTPATIENT_CLINIC_OR_DEPARTMENT_OTHER): Payer: Medicare Other | Admitting: Adult Health

## 2016-11-07 VITALS — BP 130/75 | HR 66 | Temp 97.5°F | Resp 18 | Ht 64.0 in | Wt 121.4 lb

## 2016-11-07 DIAGNOSIS — C50011 Malignant neoplasm of nipple and areola, right female breast: Secondary | ICD-10-CM | POA: Diagnosis not present

## 2016-11-07 DIAGNOSIS — C50012 Malignant neoplasm of nipple and areola, left female breast: Secondary | ICD-10-CM

## 2016-11-07 DIAGNOSIS — C50919 Malignant neoplasm of unspecified site of unspecified female breast: Secondary | ICD-10-CM

## 2016-11-07 DIAGNOSIS — R140 Abdominal distension (gaseous): Secondary | ICD-10-CM | POA: Insufficient documentation

## 2016-11-07 DIAGNOSIS — G893 Neoplasm related pain (acute) (chronic): Secondary | ICD-10-CM

## 2016-11-07 DIAGNOSIS — R1030 Lower abdominal pain, unspecified: Secondary | ICD-10-CM

## 2016-11-07 DIAGNOSIS — C7951 Secondary malignant neoplasm of bone: Secondary | ICD-10-CM | POA: Diagnosis not present

## 2016-11-07 LAB — CBC WITH DIFFERENTIAL/PLATELET
BASO%: 0.4 % (ref 0.0–2.0)
BASOS ABS: 0 10*3/uL (ref 0.0–0.1)
EOS%: 0.4 % (ref 0.0–7.0)
Eosinophils Absolute: 0 10*3/uL (ref 0.0–0.5)
HCT: 25.3 % — ABNORMAL LOW (ref 34.8–46.6)
HGB: 8 g/dL — ABNORMAL LOW (ref 11.6–15.9)
LYMPH%: 19.6 % (ref 14.0–49.7)
MCH: 22.3 pg — ABNORMAL LOW (ref 25.1–34.0)
MCHC: 31.6 g/dL (ref 31.5–36.0)
MCV: 70.7 fL — AB (ref 79.5–101.0)
MONO#: 1.4 10*3/uL — ABNORMAL HIGH (ref 0.1–0.9)
MONO%: 12.4 % (ref 0.0–14.0)
NEUT%: 67.2 % (ref 38.4–76.8)
NEUTROS ABS: 7.4 10*3/uL — AB (ref 1.5–6.5)
NRBC: 0 % (ref 0–0)
Platelets: 118 10*3/uL — ABNORMAL LOW (ref 145–400)
RBC: 3.58 10*6/uL — AB (ref 3.70–5.45)
RDW: 21.2 % — AB (ref 11.2–14.5)
WBC: 10.9 10*3/uL — AB (ref 3.9–10.3)
lymph#: 2.1 10*3/uL (ref 0.9–3.3)

## 2016-11-07 LAB — URINALYSIS, MICROSCOPIC - CHCC
Bilirubin (Urine): NEGATIVE
Glucose: NEGATIVE mg/dL
Nitrite: NEGATIVE
Protein: <30 mg/dL
SPECIFIC GRAVITY, URINE: 1.02 (ref 1.003–1.035)
UROBILINOGEN UR: 0.2 mg/dL (ref 0.2–1)
pH: 7 (ref 4.6–8.0)

## 2016-11-07 LAB — COMPREHENSIVE METABOLIC PANEL
ALT: 19 U/L (ref 0–55)
ANION GAP: 11 meq/L (ref 3–11)
AST: 46 U/L — ABNORMAL HIGH (ref 5–34)
Albumin: 3 g/dL — ABNORMAL LOW (ref 3.5–5.0)
Alkaline Phosphatase: 76 U/L (ref 40–150)
BUN: 6.5 mg/dL — ABNORMAL LOW (ref 7.0–26.0)
CHLORIDE: 99 meq/L (ref 98–109)
CO2: 26 meq/L (ref 22–29)
Calcium: 10 mg/dL (ref 8.4–10.4)
Creatinine: 0.8 mg/dL (ref 0.6–1.1)
GLUCOSE: 91 mg/dL (ref 70–140)
Potassium: 3.4 mEq/L — ABNORMAL LOW (ref 3.5–5.1)
SODIUM: 137 meq/L (ref 136–145)
Total Bilirubin: 0.48 mg/dL (ref 0.20–1.20)
Total Protein: 7.4 g/dL (ref 6.4–8.3)

## 2016-11-07 MED ORDER — HEPARIN SOD (PORK) LOCK FLUSH 100 UNIT/ML IV SOLN
500.0000 [IU] | Freq: Once | INTRAVENOUS | Status: AC | PRN
  Administered 2016-11-07: 500 [IU] via INTRAVENOUS
  Filled 2016-11-07: qty 5

## 2016-11-07 MED ORDER — LORAZEPAM 1 MG PO TABS: 1.0000 mg | ORAL_TABLET | Freq: Every day | ORAL | 0 refills | Status: DC

## 2016-11-07 MED ORDER — OXYCODONE HCL 5 MG PO TABS: 5.0000 mg | ORAL_TABLET | Freq: Four times a day (QID) | ORAL | 0 refills | Status: DC | PRN

## 2016-11-07 MED ORDER — SODIUM CHLORIDE 0.9% FLUSH
10.0000 mL | INTRAVENOUS | Status: DC | PRN
  Administered 2016-11-07: 10 mL via INTRAVENOUS
  Filled 2016-11-07: qty 10

## 2016-11-07 MED ORDER — DICYCLOMINE HCL 10 MG PO CAPS: 10.0000 mg | ORAL_CAPSULE | Freq: Three times a day (TID) | ORAL | 0 refills | Status: DC | PRN

## 2016-11-07 NOTE — Telephone Encounter (Signed)
na

## 2016-11-07 NOTE — Progress Notes (Signed)
 Patient Care Team: Angus McInnis, MD (Inactive) as PCP - General (Family Medicine) Robert M Rothbart, MD (Cardiology)  DIAGNOSIS:  Encounter Diagnoses  Name Primary?  . Metastatic breast cancer (HCC) Yes  . Lower abdominal pain   . Cancer related pain   . Bone metastases (HCC)     SUMMARY OF ONCOLOGIC HISTORY:   Bilateral breast cancer (HCC)   07/23/2013 Mammogram    Bilateral breast masses. 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 N1 M0 stage IIB ER 100%, PR 0%, HER-2       06/17/2014 -  Radiation Therapy    Adjuvant radiation therapy      09/14/2014 -  Anti-estrogen oral therapy    Anastrozole 1 mg daily      05/28/2015 Imaging    CT scans: Enlarging subpectoral masses 3.1 x 3.5 cm, posteriorly lower density mass 4.5 x 2.1 cm, several right-sided lung nodules right lower lobe 1.4 cm, 3 other right lung nodules, 1.7 cm right iliac bone lesion      06/21/2015 Procedure    Left subpectoral mass biopsy: Invasive  high-grade ductal carcinoma ER 5%, PR 0%, HER-2 negative ratio 1.29      09/01/2015 Imaging    Left chest wall mass increased in size 7.2 x 5.1 cm, multiple subcutaneous nodules, increase in the lung nodules both in number as well as in the size of existing nodules      09/10/2015 -  Chemotherapy    Carboplatin, gemcitabine days 1 and 8 q 3 weeks, carboplatin discontinued for neuropathy (treatment break from 01/20/2016 to 03/24/2016); Added Carboplatin back 04/14/16 (for progression)      01/26/2016 Imaging    Marked improvement in size of lung nodules with many of the nodules resolved index right lower lobe nodule 1.9 x 1.8 cm is now 0.7 x 0.6 cm, reduction in the size of left breast mass 7.2 cm down to 2.6 cm, enlargement in the hypodense mass in the uterus      04/06/2016 Imaging    CT chest: Interval progression of pre-existing lung nodules new lung metastases (6mm, 12mm, 2.9 cm, 1.6 cm), interval progression of disease in the left breast 4.2 cm (was 2.6 cm), additional nodules 2.3 cm and 3.2 cm; CT head: scalp mass 1.9 cm      06/27/2016 Imaging    Ct chest: Stable lesions in lt breast deep to Left   pectoralis, cystic lesion loc ant in left breast inc compared to previous; decrease in lung nodules (16m to 3 mm; 12 mm to 7 mm, RML nodule 2.9 cm to 1.8 cm, RLL 164mto 7 mm)      CHIEF COMPLIANT: Carboplatin and Gemcitabine palliative chemotherapy  INTERVAL HISTORY: Paula Navarro a [age]ear old with above-mentioned history metastatic breast cancer on palliative chemotherapy with gemcitabine and carboplatin.  She is here today due to abdominal pain, in her lower abdomen.  Worse on the left and in her pelvis.  This pain was similar to pain that she had when she was seen in ER on 3/30 and given bentyl and took mag citrate and it resolved.  She denies any associated symptoms other than constipation.    REVIEW OF SYSTEMS:   Review of Systems  Constitutional: Negative for chills, diaphoresis,  fever, malaise/fatigue and weight loss.  HENT: Negative for hearing loss and tinnitus.   Eyes: Negative for blurred vision and double vision.  Respiratory: Negative for cough and shortness of breath.   Cardiovascular: Negative for chest pain, palpitations and leg swelling.  Gastrointestinal: Positive for blood in stool (states this is chronic and accompanied with her vaginal bleeding. has been going on for about 2 years on stool) and constipation. Negative for abdominal pain, diarrhea, heartburn, melena, nausea and vomiting.  Genitourinary: Negative for dysuria.  Skin: Negative for rash.  Neurological: Negative for dizziness, tingling, focal weakness, weakness and headaches.     All other systems were reviewed with the patient and are negative.  I have reviewed the past medical history, past surgical history, social history and family history with the patient and they are unchanged from previous note.  ALLERGIES:  is allergic to codeine and morphine and related.  MEDICATIONS:  Current Outpatient Prescriptions  Medication Sig Dispense Refill  . B Complex-C (SUPER B COMPLEX PO) Take 1 tablet by mouth daily.    . calcium-vitamin D (OSCAL-500) 500-400 MG-UNIT tablet Take 1 tablet by mouth 2 (two) times daily. 60 tablet 3  . gabapentin (NEURONTIN) 300 MG capsule TAKE 2 CAPSULES (600 MG TOTAL) BY MOUTH 3 (THREE) TIMES DAILY. 180 capsule 2  . lidocaine-prilocaine (EMLA) cream Apply 1 application topically as needed. 30 g 6  . nystatin (MYCOSTATIN) 100000 UNIT/ML suspension Take 5 mLs (500,000 Units total) by mouth 4 (four) times daily. 60 mL 0  . ondansetron (ZOFRAN) 4 MG tablet Take 1 tablet (4 mg total) by mouth every 8 (eight) hours as needed for nausea or vomiting. 30 tablet 0  . prochlorperazine (COMPAZINE) 10 MG tablet TAKE 1 TABLET EVERY 6 HOURS AS NEEDED NAUEA OR VOMITING 30 tablet 1  . zolpidem (AMBIEN) 10 MG tablet Take 1 tablet (10 mg total) by mouth at bedtime. 30 tablet 2  .  cephALEXin (KEFLEX) 500 MG capsule Take 1 capsule (500 mg total) by mouth 4 (four) times daily. (Patient not taking: Reported on 11/07/2016) 20 capsule 0  . dicyclomine (BENTYL) 10 MG capsule Take 1 capsule (10 mg total) by mouth 3 (three) times daily as needed for spasms. 10 capsule 0  . LORazepam (ATIVAN) 1 MG tablet Take 1 tablet (1 mg total) by mouth at bedtime. 30 tablet 0  . oxyCODONE (OXY IR/ROXICODONE) 5 MG immediate release tablet Take 1 tablet (5 mg total) by mouth every 6 (six) hours as needed for severe pain. 30 tablet 0   Current Facility-Administered Medications  Medication Dose Route Frequency Provider Last Rate Last Dose  . sodium  chloride flush (NS) 0.9 % injection 10 mL  10 mL Intravenous PRN Nicholas Lose, MD   10 mL at 11/07/16 1217   Facility-Administered Medications Ordered in Other Visits  Medication Dose Route Frequency Provider Last Rate Last Dose  . sodium chloride flush (NS) 0.9 % injection 10 mL  10 mL Intracatheter PRN Nicholas Lose, MD   10 mL at 03/31/16 1352    PHYSICAL EXAMINATION: ECOG PERFORMANCE STATUS: 1 - Symptomatic but completely ambulatory  Vitals:   11/07/16 1059  BP: 130/75  Pulse: 66  Resp: 18  Temp: 97.5 F (36.4 C)   Filed Weights   11/07/16 1059  Weight: 121 lb 6.4 oz (55.1 kg)  GENERAL: Patient is a well appearing female in no acute distress HEENT:  Sclerae anicteric.  PERRL  Oropharynx clear and moist. No ulcerations or evidence of oropharyngeal candidiasis. Neck is supple.  NODES:  No cervical, supraclavicular, or axillary lymphadenopathy palpated.  BREAST EXAM: left chest wall mass +tenderness LUNGS:  Clear to auscultation bilaterally.  No wheezes or rhonchi. HEART:  Regular rate and rhythm. No murmur appreciated. ABDOMEN:  Soft, mild and minimal TTP in LLQ, to left lower pelvis, slightly distended.  Positive, normoactive bowel sounds. No organomegaly palpated. MSK:  No focal spinal tenderness to palpation. Full range of motion  bilaterally in the upper extremities. EXTREMITIES:  No peripheral edema.   SKIN:  Clear with no obvious rashes or skin changes. No nail dyscrasia. NEURO:  Nonfocal. Well oriented.  Appropriate affect.    LABORATORY DATA:  I have reviewed the data as listed   Chemistry      Component Value Date/Time   NA 137 11/07/2016 1005   K 3.4 (L) 11/07/2016 1005   CL 102 06/09/2016 0110   CO2 26 11/07/2016 1005   BUN 6.5 (L) 11/07/2016 1005   CREATININE 0.8 11/07/2016 1005      Component Value Date/Time   CALCIUM 10.0 11/07/2016 1005   ALKPHOS 76 11/07/2016 1005   AST 46 (H) 11/07/2016 1005   ALT 19 11/07/2016 1005   BILITOT 0.48 11/07/2016 1005       Lab Results  Component Value Date   WBC 10.9 (H) 11/07/2016   HGB 8.0 (L) 11/07/2016   HCT 25.3 (L) 11/07/2016   MCV 70.7 (L) 11/07/2016   PLT 118 (L) 11/07/2016   NEUTROABS 7.4 (H) 11/07/2016    ASSESSMENT & PLAN:  Bilateral breast cancer ([place]) Treated with neoadjuvant chemotherapy followed by surgery 04/08/2014, radiation treatment completed 07/17/2014, antiestrogen therapy with anastrozole 09/14/2014 Left breastT2, N0, M0, IDC grade 3; 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 N1 M0 stage IIB Metastatic diseasediagnosed 05/28/15 (subpectoral masses 3.5 cm and 4.5 cm. Four lung nodules the largest 1.4 cm in the right lung 1.7 cm sclerotic right iliac bone lesion) Left subpectoral mass biopsy 06/21/2015: Invasive high-grade ductal carcinoma ER 5%, PR 0%, HER-2 negative ratio 1.29 Prior treatment:  1. Xeloda 1500 mg by mouth twice a day started 07/07/2015 stopped 09/03/2015 (stopped for progression) 2. Carboplatin day 1 and gemcitabine days 1 and 8 every 3 weeks started 09/10/2015; completed cycle 5 01/20/2016 (carboplatin discontinued for  neuropathy) ------------------------------------------------------------------------------------------------------------------------------------------ Current treatment: Carboplatin and gemcitabine (carboplatin added because of progression of disease 04/14/2016),  (Chemotherapy being given on day 1, 8q 3 weeks)with neulasta  Chemotherapy toxicities: 1. Fatigue  2. Anemia due to chemotherapy: hgb 8.0 today 3. Severe leukocytosis with neutrophils markedly increased due to Neulasta: Neulasta discontinued , will resume Neulasta because of neutropenia 4. Grade 2 neuropathy  Pain issues: patient requested oxycodone 5mg prn, this was ordered, bowel regimen recommended. Insomnia: taking ambien. Bone mets: Xgeva q 6 weeks with calcium and Vit D CT chest 06/27/16: Stable lesions in lt breast and deep to Left pectoralis, cystic lesion loc ant in left breast with nodular comp which inc compared to previous  Vaginal bleeding: recommend f/u with Dr. Cousins for continued management. Scalp cyst to be removed by Dr. Toth on 11/08/2016  Bone scan02/21/2018: Asymmetric uptake at the level of the tip of the left scapula or within an adjacent rib. No evidence of active bone metastases elsewhere.  Abdominal pain and constipation.  Will get xray and prescribe bentyl as she received in ER.  I recommended that she increase her fluid intake, fiber intake, and slightly increase her bowel regimen.  We will await xray results.  She may need further imaging if pain persists.  Her urinalysis couldn't find any WBC in her urine due to crowding from the blood.  She did note a lot of vaginal bleeding when she left her sample.  After discussing with the lab technicians, I will order a urine culture in addition.    Return to clinic next week as scheduled.     I spent 25 minutes talking to the patient of which more than half was spent in counseling and coordination of care.  Orders Placed This Encounter   Procedures  . Urine Culture    Standing Status:   Future    Number of Occurrences:   1    Standing Expiration Date:   11/07/2017  . DG Abd 2 Views    Standing Status:   Future    Number of Occurrences:   1    Standing Expiration Date:   11/07/2017    Order Specific Question:   Reason for Exam (SYMPTOM  OR DIAGNOSIS REQUIRED)    Answer:   bloating, abdominal pain, r/o obstruction    Order Specific Question:   Preferred imaging location?    Answer:   [facility]    Order Specific Question:   Radiology Contrast Protocol - do NOT remove file path    Answer:   [email or web address]   The patient has a good understanding of the overall plan. she agrees with it. she will call with any problems that may develop before the next visit here.   Paula C Causey, NP 11/07/16   

## 2016-11-07 NOTE — Telephone Encounter (Signed)
-----   Message from Gardenia Phlegm, NP sent at 11/07/2016  2:38 PM EDT ----- Stool burden noted on xray.  Please encourage patient to use miralax tonight and continue on increased bowel regimen as I had suggested.  Should her pain continue, would recommend she see Dr. Garwin Brothers, as I am concerned about this pelvic pain and continued vaginal bleeding.

## 2016-11-07 NOTE — Telephone Encounter (Signed)
1500- Called pt to remind her to take her miralax tonight to help with stool burden. Pt instructed to increase water intake as well to help move her bowels better. Pt reminded to monitor vaginal bleeding and clots. Pt instructed to contact Dr.Cousin's office for worsening of symptoms. Pt verbalized understanding and grateful for the call.

## 2016-11-08 ENCOUNTER — Ambulatory Visit (HOSPITAL_BASED_OUTPATIENT_CLINIC_OR_DEPARTMENT_OTHER): Admission: RE | Admit: 2016-11-08 | Payer: Medicare Other | Source: Ambulatory Visit | Admitting: General Surgery

## 2016-11-08 ENCOUNTER — Encounter (HOSPITAL_BASED_OUTPATIENT_CLINIC_OR_DEPARTMENT_OTHER): Payer: Self-pay | Admitting: Certified Registered"

## 2016-11-08 LAB — URINE CULTURE: ORGANISM ID, BACTERIA: NO GROWTH

## 2016-11-08 SURGERY — EXCISION, LESION, SCALP
Anesthesia: General

## 2016-11-08 NOTE — Progress Notes (Signed)
error 

## 2016-11-08 NOTE — Anesthesia Preprocedure Evaluation (Deleted)
Anesthesia Evaluation  Patient identified by MRN, date of birth, ID band Patient awake    Reviewed: Allergy & Precautions, H&P , NPO status , Patient's Chart, lab work & pertinent test results  Airway Mallampati: II   Neck ROM: full    Dental   Pulmonary COPD, former smoker,    breath sounds clear to auscultation       Cardiovascular + Valvular Problems/Murmurs AI  Rhythm:regular Rate:Normal  s/p ASD repair.   TTE (2015): EF 65-70%, mild MR, mild to mod AI.   Neuro/Psych Anxiety  Neuromuscular disease    GI/Hepatic   Endo/Other    Renal/GU      Musculoskeletal   Abdominal   Peds  Hematology  (+) Blood dyscrasia, anemia ,   Anesthesia Other Findings   Reproductive/Obstetrics Breast CA metastatic                            Anesthesia Physical Anesthesia Plan  ASA: II  Anesthesia Plan: General   Post-op Pain Management:    Induction: Intravenous  Airway Management Planned: LMA  Additional Equipment:   Intra-op Plan:   Post-operative Plan:   Informed Consent: I have reviewed the patients History and Physical, chart, labs and discussed the procedure including the risks, benefits and alternatives for the proposed anesthesia with the patient or authorized representative who has indicated his/her understanding and acceptance.     Plan Discussed with: CRNA, Anesthesiologist and Surgeon  Anesthesia Plan Comments:         Anesthesia Quick Evaluation

## 2016-11-09 ENCOUNTER — Telehealth: Payer: Self-pay

## 2016-11-09 ENCOUNTER — Encounter: Payer: Self-pay | Admitting: Radiation Oncology

## 2016-11-09 ENCOUNTER — Ambulatory Visit
Admission: RE | Admit: 2016-11-09 | Discharge: 2016-11-09 | Disposition: A | Discharge status: Home / Self Care | Payer: Medicare Other | Source: Ambulatory Visit | Attending: Radiation Oncology | Admitting: Radiation Oncology

## 2016-11-09 VITALS — BP 116/71 | HR 104 | Temp 101.5°F | Resp 20 | Ht 64.0 in | Wt 121.2 lb

## 2016-11-09 DIAGNOSIS — C770 Secondary and unspecified malignant neoplasm of lymph nodes of head, face and neck: Secondary | ICD-10-CM | POA: Insufficient documentation

## 2016-11-09 DIAGNOSIS — F419 Anxiety disorder, unspecified: Secondary | ICD-10-CM | POA: Diagnosis not present

## 2016-11-09 DIAGNOSIS — C50011 Malignant neoplasm of nipple and areola, right female breast: Secondary | ICD-10-CM | POA: Diagnosis not present

## 2016-11-09 DIAGNOSIS — Z79899 Other long term (current) drug therapy: Secondary | ICD-10-CM | POA: Diagnosis not present

## 2016-11-09 DIAGNOSIS — Z87891 Personal history of nicotine dependence: Secondary | ICD-10-CM | POA: Diagnosis not present

## 2016-11-09 DIAGNOSIS — G8929 Other chronic pain: Secondary | ICD-10-CM | POA: Insufficient documentation

## 2016-11-09 DIAGNOSIS — C50012 Malignant neoplasm of nipple and areola, left female breast: Secondary | ICD-10-CM | POA: Insufficient documentation

## 2016-11-09 DIAGNOSIS — Z923 Personal history of irradiation: Secondary | ICD-10-CM | POA: Insufficient documentation

## 2016-11-09 DIAGNOSIS — Z885 Allergy status to narcotic agent status: Secondary | ICD-10-CM | POA: Diagnosis not present

## 2016-11-09 DIAGNOSIS — C7951 Secondary malignant neoplasm of bone: Secondary | ICD-10-CM

## 2016-11-09 DIAGNOSIS — G893 Neoplasm related pain (acute) (chronic): Secondary | ICD-10-CM

## 2016-11-09 DIAGNOSIS — Z9221 Personal history of antineoplastic chemotherapy: Secondary | ICD-10-CM | POA: Insufficient documentation

## 2016-11-09 DIAGNOSIS — J449 Chronic obstructive pulmonary disease, unspecified: Secondary | ICD-10-CM | POA: Insufficient documentation

## 2016-11-09 DIAGNOSIS — Z170 Estrogen receptor positive status [ER+]: Secondary | ICD-10-CM | POA: Insufficient documentation

## 2016-11-09 DIAGNOSIS — C50919 Malignant neoplasm of unspecified site of unspecified female breast: Secondary | ICD-10-CM

## 2016-11-09 DIAGNOSIS — C7801 Secondary malignant neoplasm of right lung: Secondary | ICD-10-CM

## 2016-11-09 MED ORDER — OXYCODONE HCL 5 MG PO TABS: 5.0000 mg | ORAL_TABLET | Freq: Four times a day (QID) | ORAL | 0 refills | Status: DC | PRN

## 2016-11-09 NOTE — Telephone Encounter (Signed)
-----   Message from Gardenia Phlegm, NP sent at 11/09/2016  8:45 AM EDT ----- Culture was negative.  Please call and check on patient and her abdominal pain.  I really think she may benefit from another appt with Dr. Garwin Brothers about her vaginal bleeding.

## 2016-11-09 NOTE — Progress Notes (Signed)
Radiation Oncology         [phone] ________________________________  Re-evaluation   Name: Paula Navarro MRN: [id number]  Date: 11/09/2016  DOB: Mar 28, 1951  HY:IFOYD Ailene Ravel, MD (Inactive)  Nicholas Lose, MD     REFERRING PHYSICIAN: Nicholas Lose, MD   DIAGNOSIS: The primary encounter diagnosis was Metastatic breast cancer ([place]). Diagnoses of Bone metastases ([place]), Malignant neoplasm metastatic to right lung [facility]), and Cancer related pain were also pertinent to this visit.   HISTORY OF PRESENT ILLNESS:Paula Navarro is a 66 y.o. female who is seen for an follow up new visit regarding the patient's diagnosis of bilateral breast cancer.  The patient has been treated definitively in our clinic previously and received adjuvant radiation treatment from December 2015 through January 2016 after bilateral lumpectomies and received 45 gray to the breasts, and both supraclavicular fossas as well as a 16 gray boost to the lumpectomy cavities.  She developed recurrence in the left chest wall in February 2017 with multiple subcutaneous nodules. The patient has proceeded with chemotherapy through medical oncology here at the [facility]. The patient's recent imaging included a CT scan of the chest on 04/06/2016. There is interval progression of some pre-existing pulmonary nodules as well as interval progression of disease within the left supra-auricular region/left upper chest.  The patient was seen in November 2017 for consideration of additional radiation treatment to this region, however she has put off treatment due to social issues. She underwent a whole body scan on 09/20/16. This revealed asymmetric uptake at the left scapula and adjacent rib. She last saw Dr. Lindi Adie on 10/26/16. She is currently taking Carboplatin and Gemcitabine, cycle 12. Her next treatment is 11/16/16. She returns today to discuss her options for radiotherapy.  Patient currently complains of left shoulder pain, as well as  occasional leg and thigh pain. She notes a hard knot in her left breast. She has lymphedema on the left arm that she noticed since her last radiation treatment; patient notes she has a sleeve but she did not present to the clinic with it. The patient reports her mother passed away on 01-Oct-2016. She presents today depressed, fatigued, weak, and poor appetite. She also presents today with a fever. The patient notes she recently finished taking antibiotics for a UTI. The patient notes persistent vaginal and rectal bleeding for the past two years and is due to see Dr. Garwin Brothers for this.   PREVIOUS RADIATION THERAPY:  06/30/14-08/17/14:  45 gray to the bilateral breasts and bilateral supraclavicular fossa as well as a 16 gray boost to the lumpectomy cavities   PAST MEDICAL HISTORY:  Past Medical History:  Diagnosis Date  . Anemia   . Anxiety   . Atrial septal defect 1996   Surgical repair in 1996  . Breast cancer ([place])   . Chest pain    Admitted to [facility] in 09/2011; refused stress test  . Chronic bronchitis   . Chronic pain   . COPD (chronic obstructive pulmonary disease) ([place])    on xray  . Palpitation    Tachycardia reported by monitor clerk during a symptomatic spell  . Radiation 06/30/14-08/17/14   Bilateral Breast  . Tobacco abuse    60 pack years; 1.5 packs per day  . Wears dentures    top    PAST SURGICAL HISTORY: Past Surgical History:  Procedure Laterality Date  . ASD REPAIR, OSTIUM PRIMUM  1996   dr Roxy Horseman  . AXILLARY LYMPH NODE DISSECTION Bilateral 04/08/2014  Procedure:  BILATERAL AXILLARY LYMPH NODE DISSECTION;  Surgeon: Autumn Messing III, MD;  Location: [facility];  Service: General;  Laterality: Bilateral;  . BREAST BIOPSY Bilateral   . BREAST LUMPECTOMY WITH RADIOACTIVE SEED LOCALIZATION Bilateral 04/08/2014   Procedure: BILATERAL  RADIOACTIVE SEED LOCALIZATION LUMPECTOMY ;  Surgeon: Autumn Messing III, MD;  Location: [facility];  Service: General;   Laterality: Bilateral;  . CESAREAN SECTION     x3  . CHOLECYSTECTOMY    . open heart surgery    . PORT A CATH REVISION  1/15   put in   . TUBAL LIGATION       FAMILY HISTORY:  Family History  Problem Relation Age of Onset  . Breast cancer Maternal Aunt      SOCIAL HISTORY:  reports that she quit smoking about 4 years ago. Her smoking use included Cigarettes. She has a 60.00 pack-year smoking history. She has never used smokeless tobacco. She reports that she does not drink alcohol or use drugs. The patient is widowed and resides in [place].    ALLERGIES: Codeine and Morphine and related   MEDICATIONS:  Current Outpatient Prescriptions  Medication Sig Dispense Refill  . B Complex-C (SUPER B COMPLEX PO) Take 1 tablet by mouth daily.    . calcium-vitamin D (OSCAL-500) 500-400 MG-UNIT tablet Take 1 tablet by mouth 2 (two) times daily. 60 tablet 3  . dicyclomine (BENTYL) 10 MG capsule Take 1 capsule (10 mg total) by mouth 3 (three) times daily as needed for spasms. 10 capsule 0  . gabapentin (NEURONTIN) 300 MG capsule TAKE 2 CAPSULES (600 MG TOTAL) BY MOUTH 3 (THREE) TIMES DAILY. 180 capsule 2  . lidocaine-prilocaine (EMLA) cream Apply 1 application topically as needed. 30 g 6  . LORazepam (ATIVAN) 1 MG tablet Take 1 tablet (1 mg total) by mouth at bedtime. 30 tablet 0  . ondansetron (ZOFRAN) 4 MG tablet Take 1 tablet (4 mg total) by mouth every 8 (eight) hours as needed for nausea or vomiting. 30 tablet 0  . zolpidem (AMBIEN) 10 MG tablet Take 1 tablet (10 mg total) by mouth at bedtime. 30 tablet 2  . nystatin (MYCOSTATIN) 100000 UNIT/ML suspension Take 5 mLs (500,000 Units total) by mouth 4 (four) times daily. (Patient not taking: Reported on 11/09/2016) 60 mL 0  . oxyCODONE (OXY IR/ROXICODONE) 5 MG immediate release tablet Take 1 tablet (5 mg total) by mouth every 6 (six) hours as needed for severe pain. 30 tablet 0  . prochlorperazine (COMPAZINE) 10 MG tablet TAKE 1 TABLET EVERY  6 HOURS AS NEEDED NAUEA OR VOMITING (Patient not taking: Reported on 11/09/2016) 30 tablet 1   No current facility-administered medications for this encounter.    Facility-Administered Medications Ordered in Other Encounters  Medication Dose Route Frequency Provider Last Rate Last Dose  . sodium chloride flush (NS) 0.9 % injection 10 mL  10 mL Intracatheter PRN Nicholas Lose, MD   10 mL at 03/31/16 1352     REVIEW OF SYSTEMS:  See HPI. She denies any chest pain, shortness of breath, cough, fevers, chills, night sweats. She's lost about 10 pounds in the last two months. She denies any bowel or bladder disturbances, and denies nausea or vomiting. She denies any new musculoskeletal or joint aches or pains, new skin lesions or concerns. A complete review of systems is obtained and is otherwise negative.   PHYSICAL EXAM:  height is '5\' 4"'$  (1.626 m) and weight is 121 lb 3.2  oz (55 kg). Her oral temperature is 101.5 F (38.6 C) (abnormal). Her blood pressure is 116/71 and her pulse is 104 (abnormal). Her respiration is 20 and oxygen saturation is 94%.   In general this is a chronically ill appearing african american woman in no acute distress. She's alert and oriented x4 and appropriate throughout the examination. Cardiopulmonary assessment is negative for acute distress and she exhibits normal effort. Patient presents in a wheelchair. Along the upper anterior chest wall abutting the axilla, there is palpable tumor about 8 cm length, 4 cm wide from base of clavicle diagonal across the chest wall above the lumpectomy scar not involving the axilla. There is 2+ edema of the LUE consistent with lymphedema.  ECOG = 2  0 - Asymptomatic (Fully active, able to carry on all predisease activities without restriction)  1 - Symptomatic but completely ambulatory (Restricted in physically strenuous activity but ambulatory and able to carry out work of a light or sedentary nature. For example, light housework, office  work)  2 - Symptomatic, <50% in bed during the day (Ambulatory and capable of all self care but unable to carry out any work activities. Up and about more than 50% of waking hours)  3 - Symptomatic, >50% in bed, but not bedbound (Capable of only limited self-care, confined to bed or chair 50% or more of waking hours)  4 - Bedbound (Completely disabled. Cannot carry on any self-care. Totally confined to bed or chair)  5 - Death   Eustace Pen MM, Creech RH, Tormey DC, et al. [phone]). "Toxicity and response criteria of the [facility]". [place] Oncol. 5 (6): 649-55    LABORATORY DATA:  Lab Results  Component Value Date   WBC 10.9 (H) 11/07/2016   HGB 8.0 (L) 11/07/2016   HCT 25.3 (L) 11/07/2016   MCV 70.7 (L) 11/07/2016   PLT 118 (L) 11/07/2016   Lab Results  Component Value Date   NA 137 11/07/2016   K 3.4 (L) 11/07/2016   CL 102 06/09/2016   CO2 26 11/07/2016   Lab Results  Component Value Date   ALT 19 11/07/2016   AST 46 (H) 11/07/2016   ALKPHOS 76 11/07/2016   BILITOT 0.48 11/07/2016      RADIOGRAPHY: Dg Abd 2 Views  Result Date: 11/07/2016 CLINICAL DATA:  Constipation. EXAM: ABDOMEN - 2 VIEW COMPARISON:  None. FINDINGS: The bowel gas pattern is normal. There is no evidence of free air. No radio-opaque calculi or other significant radiographic abnormality is seen. Moderate stool burden. Surgical clip RIGHT upper quadrant. Cardiomegaly. Osteopenia. IMPRESSION: Moderate stool burden.  No obstruction or free air. Electronically Signed   By: Staci Righter M.D.   On: 11/07/2016 14:30       IMPRESSION/PLAN:  1.  Metastatic bilateral breast cancer with chest wall recurrence as well as local failure in the left supraclavicular region. Again Dr. Lisbeth Renshaw has reviewed the patient's previous imaging as well as her examination findings. He recommends a course of 3 weeks of radiotherapy to improve local control. The patient would be interested in pursuing this. We  discussed the risks, benefits, short, and long term effects of radiotherapy, and the patient is interested in proceeding. Dr. Lisbeth Renshaw discusses the delivery and logistics of radiotherapy.we did outline the concerns for lymphedema given this treatment site as well as her current symptoms. We will coordinate for her to meet back with PT at [facility] for management of this. 2. Possible genetic predisposition to malignancy.  The patient will be referred for genetic testing given her history and family history. 3. Depression and grief. The patient recently lost her mother and we will ask social work to try to help [place] coordinate additional resources such as counseling for the patient.  In a visit lasting 45 minutes, greater than 50% of the time was spent face to face discussing the patient's disease, current needs, and coordinating the patient's care.   The above documentation reflects my direct findings during this shared patient visit. Please see the separate note by Dr.Moody on this date for the remainder of the patient's plan of care.     Carola Rhine, PAC    This document serves as a record of services personally performed by Kyung Rudd, MD and Shona Simpson, PA-C. It was created on their behalf by Bethann Humble, a trained medical scribe. The creation of this record is based on the scribe's personal observations and the provider's statements to them. This document has been checked and approved by the attending provider.

## 2016-11-09 NOTE — Telephone Encounter (Signed)
error 

## 2016-11-09 NOTE — Progress Notes (Signed)
Re consult  Metastatic breat Cancer, now Left chest wall mass   Radiation B/L Breast from 06/30/14-08/17/14 Dr. Thea Silversmith  Dr. Lindi Adie MD last office visit 08/24/2016: Prior treatment:  1. Xeloda 1500 mg by mouth twice a day started 07/07/2015 stopped 09/03/2015 (stopped for progression) 2. Carboplatin day 1 and gemcitabine days 1 and 8 every 3 weeks started 09/10/2015; completed cycle 5 01/20/2016 (carboplatin discontinued for neuropathy) ------------------------------------------------------------------------------------------------------------------------------------------ Current treatment: Carboplatin and gemcitabine (carboplatin added because of progression of disease 04/14/2016), today is cycle 14day 1, 8q 3 weeks with neulasta  Chemotherapy toxicities: 1. Fatigue 2. Anemia due to chemotherapy: hgb improved mildly to 9.0 3. Severe leukocytosis with neutrophils markedly increased due to Neulasta: Neulasta discontinued , will resume Neulasta because of neutropenia 4. Grade 2 neuropathy  Pain issues: Oxycontin for long acting pain medication. Pain well controlled Insomnia: D/Ced Ativan. Given trazodone Lymphedema: Continue to wear compression sleeves daily. Patient does not use it often Bone mets: Xgeva q 6 weeks with calcium and Vit D CT chest 06/27/16: Stable lesions in lt breast and deep to Left pectoralis, cystic lesion loc ant in left breast with enh nodular comp which inc compared to previous  Vaginal bleeding: Patient is contemplating on doing a D&C with Dr. Garwin Brothers Scalp cyst could be a sebaceous cyst: I asked the patient to see Dr. Marlou Starks to see if it could be lysed. Bone scan and Ct chest/abd/pelvis 09/13/16  Return to clinic in 3 weeks for next cycle 09/21/16  Sleepy,  Vitals taken, depressed, no PAIN   At present, has hard kniot left breast, lymphedema on left arm, legs and thigh hurts at times, lost her mother in February 27, 218, depressed, little appetite,  just now improving   Patient  FEELS WEAK AND TIRED, GAVE MASK FOR FEVER, PATEINT STATED SHE ALMOST RAN OFF THE ROAD TODAY COMING HERE AND THE OTHER DAY BP 116/71 (BP Location: Right Arm, Patient Position: Sitting, Cuff Size: Normal)   Pulse (!) 104   Temp (!) 101.5 F (38.6 C) (Oral)   Resp 20   Ht '5\' 4"'$  (1.626 m)   Wt 121 lb 3.2 oz (55 kg)   LMP 05/20/2011   SpO2 94% Comment: room air  BMI 20.80 kg/m   Wt Readings from Last 3 Encounters:  11/09/16 121 lb 3.2 oz (55 kg)  11/07/16 121 lb 6.4 oz (55.1 kg)  10/27/16 121 lb (54.9 kg)

## 2016-11-09 NOTE — Telephone Encounter (Signed)
Called pt to f/u on her abdominal pain. Pt states that she had been taking miralax and had been feeling much better. Pt moving her bowels regularly and her vaginal bleeding has decreased. Pt instructed to call Dr.Cousins if bleeding gets worse. Notified pt that her Urine culture was negative. Pt verbalized understanding and has no further concerns at this time.

## 2016-11-10 ENCOUNTER — Telehealth: Payer: Self-pay | Admitting: Genetics

## 2016-11-10 ENCOUNTER — Other Ambulatory Visit: Payer: Self-pay | Admitting: Radiation Oncology

## 2016-11-10 ENCOUNTER — Telehealth: Payer: Self-pay | Admitting: *Deleted

## 2016-11-10 DIAGNOSIS — C50011 Malignant neoplasm of nipple and areola, right female breast: Secondary | ICD-10-CM

## 2016-11-10 DIAGNOSIS — C50012 Malignant neoplasm of nipple and areola, left female breast: Principal | ICD-10-CM

## 2016-11-10 DIAGNOSIS — C50919 Malignant neoplasm of unspecified site of unspecified female breast: Secondary | ICD-10-CM

## 2016-11-10 NOTE — Telephone Encounter (Signed)
Received a call from [facility] in [place] to schedule a genetic counseling appt. Appt has been scheduled for the pt to see Vicente Males on 4/24 at [facility] will notify the pt of the appt date and time.

## 2016-11-10 NOTE — Telephone Encounter (Signed)
CALLED PATIENT TO INFORM OF GENETICS APPT. ON 11-21-16- ARRIVAL TIME - 12:30 PM WITH ANNA VILLA, NO ANSWER MAILED APPT. CARD

## 2016-11-13 ENCOUNTER — Inpatient Hospital Stay (HOSPITAL_COMMUNITY)
Admission: EM | Admit: 2016-11-13 | Discharge: 2016-11-16 | DRG: 689 | Disposition: A | Discharge status: Home / Self Care | Payer: Medicare Other | Attending: Family Medicine | Admitting: Family Medicine

## 2016-11-13 ENCOUNTER — Ambulatory Visit: Payer: Medicare Other | Admitting: Radiation Oncology

## 2016-11-13 ENCOUNTER — Telehealth: Payer: Self-pay | Admitting: Gastroenterology

## 2016-11-13 ENCOUNTER — Emergency Department (HOSPITAL_COMMUNITY): Payer: Medicare Other

## 2016-11-13 DIAGNOSIS — N3001 Acute cystitis with hematuria: Principal | ICD-10-CM | POA: Diagnosis present

## 2016-11-13 DIAGNOSIS — F419 Anxiety disorder, unspecified: Secondary | ICD-10-CM | POA: Diagnosis present

## 2016-11-13 DIAGNOSIS — C50912 Malignant neoplasm of unspecified site of left female breast: Secondary | ICD-10-CM | POA: Diagnosis present

## 2016-11-13 DIAGNOSIS — K922 Gastrointestinal hemorrhage, unspecified: Secondary | ICD-10-CM

## 2016-11-13 DIAGNOSIS — Z6822 Body mass index (BMI) 22.0-22.9, adult: Secondary | ICD-10-CM

## 2016-11-13 DIAGNOSIS — R109 Unspecified abdominal pain: Secondary | ICD-10-CM | POA: Diagnosis not present

## 2016-11-13 DIAGNOSIS — C50911 Malignant neoplasm of unspecified site of right female breast: Secondary | ICD-10-CM | POA: Diagnosis present

## 2016-11-13 DIAGNOSIS — N939 Abnormal uterine and vaginal bleeding, unspecified: Secondary | ICD-10-CM | POA: Diagnosis present

## 2016-11-13 DIAGNOSIS — J449 Chronic obstructive pulmonary disease, unspecified: Secondary | ICD-10-CM

## 2016-11-13 DIAGNOSIS — N390 Urinary tract infection, site not specified: Secondary | ICD-10-CM | POA: Diagnosis present

## 2016-11-13 DIAGNOSIS — C7800 Secondary malignant neoplasm of unspecified lung: Secondary | ICD-10-CM | POA: Diagnosis present

## 2016-11-13 DIAGNOSIS — D259 Leiomyoma of uterus, unspecified: Secondary | ICD-10-CM | POA: Diagnosis present

## 2016-11-13 DIAGNOSIS — Z79899 Other long term (current) drug therapy: Secondary | ICD-10-CM

## 2016-11-13 DIAGNOSIS — Z87891 Personal history of nicotine dependence: Secondary | ICD-10-CM

## 2016-11-13 DIAGNOSIS — C7951 Secondary malignant neoplasm of bone: Secondary | ICD-10-CM | POA: Diagnosis present

## 2016-11-13 DIAGNOSIS — E43 Unspecified severe protein-calorie malnutrition: Secondary | ICD-10-CM | POA: Insufficient documentation

## 2016-11-13 DIAGNOSIS — G8929 Other chronic pain: Secondary | ICD-10-CM | POA: Diagnosis present

## 2016-11-13 DIAGNOSIS — K760 Fatty (change of) liver, not elsewhere classified: Secondary | ICD-10-CM | POA: Diagnosis present

## 2016-11-13 LAB — CBC
HEMATOCRIT: 26.5 % — AB (ref 36.0–46.0)
Hemoglobin: 8.4 g/dL — ABNORMAL LOW (ref 12.0–15.0)
MCH: 22.6 pg — ABNORMAL LOW (ref 26.0–34.0)
MCHC: 31.7 g/dL (ref 30.0–36.0)
MCV: 71.2 fL — ABNORMAL LOW (ref 78.0–100.0)
Platelets: 281 10*3/uL (ref 150–400)
RBC: 3.72 MIL/uL — ABNORMAL LOW (ref 3.87–5.11)
RDW: 22 % — AB (ref 11.5–15.5)
WBC: 13.6 10*3/uL — ABNORMAL HIGH (ref 4.0–10.5)

## 2016-11-13 LAB — COMPREHENSIVE METABOLIC PANEL
ALBUMIN: 2.9 g/dL — AB (ref 3.5–5.0)
ALK PHOS: 60 U/L (ref 38–126)
ALT: 18 U/L (ref 14–54)
AST: 51 U/L — AB (ref 15–41)
Anion gap: 11 (ref 5–15)
BUN: 7 mg/dL (ref 6–20)
CO2: 25 mmol/L (ref 22–32)
CREATININE: 0.72 mg/dL (ref 0.44–1.00)
Calcium: 9 mg/dL (ref 8.9–10.3)
Chloride: 96 mmol/L — ABNORMAL LOW (ref 101–111)
GFR calc Af Amer: >60 mL/min (ref >60)
GFR calc non Af Amer: >60 mL/min (ref >60)
GLUCOSE: 106 mg/dL — AB (ref 65–99)
POTASSIUM: 3.1 mmol/L — AB (ref 3.5–5.1)
Sodium: 132 mmol/L — ABNORMAL LOW (ref 135–145)
Total Bilirubin: 0.8 mg/dL (ref 0.3–1.2)
Total Protein: 7.4 g/dL (ref 6.5–8.1)

## 2016-11-13 LAB — I-STAT CG4 LACTIC ACID, ED: Lactic Acid, Venous: 1.39 mmol/L (ref 0.5–1.9)

## 2016-11-13 LAB — LIPASE, BLOOD: Lipase: 16 U/L (ref 11–51)

## 2016-11-13 LAB — PROTIME-INR
INR: 1.17
PROTHROMBIN TIME: 14.9 s (ref 11.4–15.2)

## 2016-11-13 MED ORDER — HYDROMORPHONE HCL 1 MG/ML IJ SOLN
1.0000 mg | Freq: Once | INTRAMUSCULAR | Status: AC
  Administered 2016-11-14: 1 mg via INTRAVENOUS
  Filled 2016-11-13: qty 1

## 2016-11-13 MED ORDER — SODIUM CHLORIDE 0.9 % IV BOLUS (SEPSIS)
1000.0000 mL | Freq: Once | INTRAVENOUS | Status: AC
  Administered 2016-11-14: 1000 mL via INTRAVENOUS

## 2016-11-13 MED ORDER — SODIUM CHLORIDE 0.9 % IV BOLUS (SEPSIS)
250.0000 mL | Freq: Once | INTRAVENOUS | Status: AC
  Administered 2016-11-14: 250 mL via INTRAVENOUS

## 2016-11-13 MED ORDER — IOPAMIDOL (ISOVUE-300) INJECTION 61%
100.0000 mL | Freq: Once | INTRAVENOUS | Status: AC | PRN
Start: 2016-11-13 — End: 2016-11-14
  Administered 2016-11-14: 100 mL via INTRAVENOUS

## 2016-11-13 MED ORDER — SODIUM CHLORIDE 0.9 % IV BOLUS (SEPSIS)
500.0000 mL | Freq: Once | INTRAVENOUS | Status: AC
  Administered 2016-11-14: 500 mL via INTRAVENOUS

## 2016-11-13 MED ORDER — PIPERACILLIN-TAZOBACTAM 3.375 G IVPB 30 MIN
3.3750 g | Freq: Once | INTRAVENOUS | Status: AC
  Administered 2016-11-14: 3.375 g via INTRAVENOUS
  Filled 2016-11-13: qty 50

## 2016-11-13 NOTE — ED Provider Notes (Signed)
[facility] DEPT Provider Note   CSN: [id number] Arrival date & time: 11/13/16  2105 By signing my name below, I, Dyke Brackett, attest that this documentation has been prepared under the direction and in the presence of Varney Biles, MD . Electronically Signed: Dyke Brackett, Scribe. 11/13/2016. 11:59 PM.   History   Chief Complaint Chief Complaint  Patient presents with  . abdominal pain-chemo card   HPI Paula Navarro is a 66 y.o. female with a history of breast cancer and chronic pain, who presents to the Emergency Department complaining of gradually worsening, intermittent abdominal pain onset two weeks ago, but significantly worse tonight. Per pt, her pain lasts for a few minutes before resolving. Pain is exacerbated by eating; no alleviating factors noted. Pt reports associated constipation, hematochezia, dizziness, rectal bleeding. Pt has had rectal bleeding x 6 months - 1 year now - she has not made her PCP aware. Pt also has been having uterine bleeding for more than a year - PCP aware. Pt is febrile during exam, but states she was unaware of any fever while at home. She has taken Miralax and used enemas with no significant relief of constipation. Per pt, she has had rectal bleeding for 6 months to 1 year and uterine bleeding for 1 year. She is currently receiving chemotherapy for breast cancer. She denies any CP, SOB, dysuria, cough, rashes, wounds, or any other acute complaints.   The history is provided by the patient. No language interpreter was used.   Past Medical History:  Diagnosis Date  . Anemia   . Anxiety   . Atrial septal defect 1996   Surgical repair in 1996  . Breast cancer ([place])   . Chest pain    Admitted to [facility] in 09/2011; refused stress test  . Chronic bronchitis   . Chronic pain   . COPD (chronic obstructive pulmonary disease) ([place])    on xray  . Palpitation    Tachycardia reported by monitor clerk during a symptomatic spell  . Radiation  06/30/14-08/17/14   Bilateral Breast  . Tobacco abuse    60 pack years; 1.5 packs per day  . Wears dentures    top    Patient Active Problem List   Diagnosis Date Noted  . UTI (urinary tract infection) 11/14/2016  . COPD (chronic obstructive pulmonary disease) ([place])   . 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 Chemotherapy-induced thrombocytopenia 06/13/2016  . Scalp lesion 04/01/2016  . Encounter for central line care 12/08/2015  . Port catheter in place 11/19/2015  . Insomnia 11/01/2015  . Microcytic anemia 09/28/2015  . Lung metastases ([place]) 09/10/2015  . Bone metastases ([place]) 07/20/2015  . Encounter for chemotherapy management 07/13/2015  . Metastatic breast cancer ([place]) 06/29/2015  . Chronic pain 03/16/2015  . Vaginal bleeding 09/24/2014  . Postherpetic neuralgia 09/03/2014  . Lymphedema 09/03/2014  . Suspected herpes zoster left C5 distribution 08/14/2014  . Neuropathy due to chemotherapeutic drug ([place]) 03/03/2014  . Anxiety 03/03/2014  . Bilateral breast cancer ([place]) 08/11/2013  . Palpitation   . Chest pain   . Atrial septal defect   . Laboratory test 11/25/2011  . Chronic bronchitis   . Tobacco abuse     Past Surgical History:  Procedure Laterality Date  . ASD REPAIR, OSTIUM PRIMUM  1996   dr Roxy Horseman  . AXILLARY LYMPH NODE DISSECTION Bilateral 04/08/2014   Procedure:  BILATERAL AXILLARY LYMPH NODE DISSECTION;  Surgeon: Autumn Messing III, MD;  Location: [facility];  Service: General;  Laterality: Bilateral;  . BREAST BIOPSY Bilateral   . BREAST LUMPECTOMY WITH RADIOACTIVE SEED LOCALIZATION Bilateral 04/08/2014   Procedure: BILATERAL  RADIOACTIVE SEED LOCALIZATION LUMPECTOMY ;  Surgeon: Autumn Messing III, MD;  Location: [facility];  Service: General;  Laterality: Bilateral;  . CESAREAN SECTION     x3  . CHOLECYSTECTOMY    . open heart surgery    . PORT A CATH REVISION  1/15   put in   . TUBAL LIGATION      OB History      Gravida Para Term Preterm AB Living   '4 3 3         '$ SAB TAB Ectopic Multiple Live Births                  Home Medications    Prior to Admission medications   Medication Sig Start Date End Date Taking? Authorizing Provider  B Complex-C (SUPER B COMPLEX PO) Take 1 tablet by mouth daily.   Yes Historical Provider, MD  bisacodyl (LAXATIVE) 5 MG EC tablet Take 5 mg by mouth 2 (two) times daily.   Yes Historical Provider, MD  calcium-vitamin D (OSCAL-500) 500-400 MG-UNIT tablet Take 1 tablet by mouth 2 (two) times daily. 08/18/15  Yes Nicholas Lose, MD  dicyclomine (BENTYL) 10 MG capsule Take 1 capsule (10 mg total) by mouth 3 (three) times daily as needed for spasms. 11/07/16  Yes Gardenia Phlegm, NP  gabapentin (NEURONTIN) 300 MG capsule TAKE 2 CAPSULES (600 MG TOTAL) BY MOUTH 3 (THREE) TIMES DAILY. 08/30/16  Yes Nicholas Lose, MD  LORazepam (ATIVAN) 1 MG tablet Take 1 tablet (1 mg total) by mouth at bedtime. 11/07/16  Yes Nicholas Lose, MD  mineral oil enema Place 1 enema rectally once.   Yes Historical Provider, MD  ondansetron (ZOFRAN) 4 MG tablet Take 1 tablet (4 mg total) by mouth every 8 (eight) hours as needed for nausea or vomiting. 07/28/16  Yes Nicholas Lose, MD  oxyCODONE (OXY IR/ROXICODONE) 5 MG immediate release tablet Take 1 tablet (5 mg total) by mouth every 6 (six) hours as needed for severe pain. 11/09/16  Yes Hayden Pedro, PA-C  zolpidem (AMBIEN) 10 MG tablet Take 1 tablet (10 mg total) by mouth at bedtime. 10/26/16  Yes Nicholas Lose, MD  lidocaine-prilocaine (EMLA) cream Apply 1 application topically as needed. 10/12/16   Nicholas Lose, MD  nystatin (MYCOSTATIN) 100000 UNIT/ML suspension Take 5 mLs (500,000 Units total) by mouth 4 (four) times daily. Patient not taking: Reported on 11/09/2016 08/03/16   Nicholas Lose, MD  prochlorperazine (COMPAZINE) 10 MG tablet TAKE 1 TABLET EVERY 6 HOURS AS NEEDED [place] OR VOMITING Patient not taking: Reported on 11/09/2016 10/12/16    Nicholas Lose, MD    Family History Family History  Problem Relation Age of Onset  . Breast cancer Maternal Aunt     Social History Social History  Substance Use Topics  . Smoking status: Former Smoker    Packs/day: 1.50    Years: 40.00    Types: Cigarettes    Quit date: 08/01/2012  . Smokeless tobacco: Never Used  . Alcohol use No    Allergies   Codeine and Morphine and related  Review of Systems Review of Systems  Constitutional: Positive for fever.  Respiratory: Negative for cough and shortness of breath.   Cardiovascular: Negative for chest pain.  Gastrointestinal: Positive for abdominal pain, anal bleeding, blood in stool and constipation.  Genitourinary: Positive for  hematuria and vaginal bleeding. Negative for dysuria.  Skin: Negative for rash and wound.  Neurological: Positive for dizziness.  All other systems reviewed and are negative.  Physical Exam Updated Vital Signs BP 123/62 (BP Location: Right Arm)   Pulse 79   Temp 98.5 F (36.9 C) (Oral)   Resp (!) 22   Ht '5\' 2"'$  (1.575 m)   Wt 123 lb 6.4 oz (56 kg)   LMP 05/20/2011   SpO2 96%   BMI 22.57 kg/m   Physical Exam  Constitutional: She is oriented to person, place, and time. She appears well-developed and well-nourished. No distress.  HENT:  Head: Normocephalic and atraumatic.  Eyes: Conjunctivae are normal.  Cardiovascular: Normal rate.   Pulmonary/Chest: Effort normal.  Abdominal: She exhibits no distension. There is tenderness. There is guarding.  Pt has generalized tenderness  Neurological: She is alert and oriented to person, place, and time.  Skin: Skin is warm and dry.  Psychiatric: She has a normal mood and affect.  Nursing note and vitals reviewed.  ED Treatments / Results  DIAGNOSTIC STUDIES:  Oxygen Saturation is 97% on RA, normal by my interpretation.    COORDINATION OF CARE:  12:11 AM Will order CT abdomen. Discussed treatment plan with pt at bedside and pt agreed to plan.    Labs (all labs ordered are listed, but only abnormal results are displayed) Labs Reviewed  COMPREHENSIVE METABOLIC PANEL - Abnormal; Notable for the following:       Result Value   Sodium 132 (*)    Potassium 3.1 (*)    Chloride 96 (*)    Glucose, Bld 106 (*)    Albumin 2.9 (*)    AST 51 (*)    All other components within normal limits  CBC - Abnormal; Notable for the following:    WBC 13.6 (*)    RBC 3.72 (*)    Hemoglobin 8.4 (*)    HCT 26.5 (*)    MCV 71.2 (*)    MCH 22.6 (*)    RDW 22.0 (*)    All other components within normal limits  URINALYSIS, ROUTINE W REFLEX MICROSCOPIC - Abnormal; Notable for the following:    APPearance CLOUDY (*)    Hgb urine dipstick LARGE (*)    Protein, ur 30 (*)    Leukocytes, UA LARGE (*)    Bacteria, UA RARE (*)    All other components within normal limits  CULTURE, BLOOD (ROUTINE X 2)  CULTURE, BLOOD (ROUTINE X 2)  LIPASE, BLOOD  PROTIME-INR  I-STAT CG4 LACTIC ACID, ED  I-STAT CG4 LACTIC ACID, ED  I-STAT CG4 LACTIC ACID, ED  POC OCCULT BLOOD, ED  I-STAT TROPOININ, ED    EKG  EKG Interpretation None       Radiology Ct Abdomen Pelvis W Contrast  Result Date: 11/14/2016 CLINICAL DATA:  Lower abdominal pain for 2 weeks. Worse tonight. Constipation, hematochezia, dizziness, and rectal bleeding. Rectal bleeding for 6-12 months. History of breast cancer and chronic pain. EXAM: CT ABDOMEN AND PELVIS WITH CONTRAST TECHNIQUE: Multidetector CT imaging of the abdomen and pelvis was performed using the standard protocol following bolus administration of intravenous contrast. CONTRAST:  129m ISOVUE-300 IOPAMIDOL (ISOVUE-300) INJECTION 61% COMPARISON:  01/26/2016 FINDINGS: Lower chest: Postoperative changes and scarring in the right breast. Small right pleural effusion. Bilateral basilar atelectasis. 3 mm nodule in the right middle lung anteriorly. This was not identified on the previous study. Developing pulmonary nodule should be excluded.  Mild scarring in the right  lung anteriorly is probably due to radiation change. Hepatobiliary: Diffuse fatty infiltration of the liver. No focal lesions identified. Surgical absence of the gallbladder. Intra and extrahepatic bile duct dilatation has progressed somewhat since previous study but could be related to postoperative change. No obstructing mass or stone is identified. Pancreas: Mild diffuse pancreatic ductal dilatation without change. Pancreatic parenchymal appearance is unremarkable. No mass lesions identified. Spleen: Normal in size without focal abnormality. Adrenals/Urinary Tract: No adrenal gland nodules. Renal nephrograms are symmetrical. No hydronephrosis or hydroureter. Mild bladder wall thickening could indicate cystitis. No filling defects. Stomach/Bowel: Stomach and small bowel are normal for degree of distention. Colon is decompressed. No discrete colonic wall thickening or inflammatory reaction. Appendix is not identified. Vascular/Lymphatic: Aortic atherosclerosis with scattered calcification. No aortic aneurysm. Normal caliber IVC. Interval development of diffuse soft tissue in the retroperitoneum and retrocrural spaces likely representing developing lymphadenopathy. Appearance is suspicious for metastatic disease. Reproductive: Prominent heterogeneous nodular enlargement of the uterus consistent with fibroids. Some of the fibroids are calcified and some demonstrate low attenuation consistent with necrosis. Largest measures about 7.8 x 8 cm in diameter. Size is increasing since previous study. This could be due to necrosis and hemorrhage within the fibroids. Sarcomatous degeneration is not excluded. Ovaries are not enlarged. Prominent pelvic varices. Other: No free air or free fluid in the abdomen. Abdominal wall musculature appears intact. Fat density in the right groin and likely represents lipoma. Musculoskeletal: No destructive bone lesions. IMPRESSION: 1. No definite evidence of bowel  obstruction or inflammation although under distention limits evaluation of the bowel wall. 2. Interval development of retroperitoneal and retrocrural lymphadenopathy suspicious for metastatic disease. 3. 3 mm nodule developing in the right middle lung anteriorly. Suggest six-month follow-up. 4. Small right pleural effusion. 5. Diffuse fatty infiltration of the liver. Increasing bile duct dilatation and pancreatic ductal dilatation. This may be physiologic after cholecystectomy but occult obstructing lesion is not excluded. Consider further evaluation with MRI/MRCP in follow-up. 6. Multiple large uterine fibroids, increasing in size since previous study. This may be due to interval necrosis of the lesions but leiomyosarcoma is not excluded. Electronically Signed   By: Lucienne Capers M.D.   On: 11/14/2016 01:42    Procedures Procedures (including critical care time)  Medications Ordered in ED Medications  calcium-vitamin D (OSCAL WITH D) 500-200 MG-UNIT per tablet 1 tablet (1 tablet Oral Not Given 11/14/16 0547)  ondansetron (ZOFRAN) tablet 4 mg (not administered)  zolpidem (AMBIEN) tablet 5 mg (not administered)  dicyclomine (BENTYL) capsule 10 mg (not administered)  LORazepam (ATIVAN) tablet 1 mg (not administered)  oxyCODONE (Oxy IR/ROXICODONE) immediate release tablet 5 mg (not administered)  bisacodyl (DULCOLAX) EC tablet 5 mg (5 mg Oral Not Given 11/14/16 0547)  mineral oil enema 1 enema (0 enemas Rectal Hold 11/14/16 0548)  sodium chloride flush (NS) 0.9 % injection 3 mL (3 mLs Intravenous Not Given 11/14/16 0549)  sodium chloride flush (NS) 0.9 % injection 3 mL (not administered)  0.9 %  sodium chloride infusion (not administered)  ondansetron (ZOFRAN) tablet 4 mg (not administered)    Or  ondansetron (ZOFRAN) injection 4 mg (not administered)  traMADol (ULTRAM) tablet 50 mg (not administered)  sodium chloride 0.9 % bolus 1,000 mL (0 mLs Intravenous Stopped 11/14/16 0136)    And  sodium  chloride 0.9 % bolus 500 mL (0 mLs Intravenous Stopped 11/14/16 0242)    And  sodium chloride 0.9 % bolus 250 mL (250 mLs Intravenous Transfusing/Transfer 11/14/16 0315)  piperacillin-tazobactam (ZOSYN)  IVPB 3.375 g (0 g Intravenous Stopped 11/14/16 0136)  HYDROmorphone (DILAUDID) injection 1 mg (1 mg Intravenous Given 11/14/16 0004)  iopamidol (ISOVUE-300) 61 % injection 100 mL (100 mLs Intravenous Contrast Given 11/14/16 0054)  potassium chloride SA (K-DUR,KLOR-CON) CR tablet 40 mEq (40 mEq Oral Given 11/14/16 0135)  HYDROmorphone (DILAUDID) injection 1 mg (1 mg Intravenous Given 11/14/16 0314)  levofloxacin (LEVAQUIN) tablet 750 mg (750 mg Oral Given 11/14/16 0523)     Initial Impression / Assessment and Plan / ED Course  I have reviewed the triage vital signs and the nursing notes.  Pertinent labs & imaging results that were available during my care of the patient were reviewed by me and considered in my medical decision making (see chart for details).  Clinical Course as of Nov 14 712  Tue Nov 14, 2016  0659 Results from the ER workup discussed with the patient face to face and all questions answered to the best of my ability.  It seems like the abd pain could be due to the fibroid . Uterine mass and the ischemia we see with it.  Pt is still having significant pain - so we will admit for optimal pain control.  She also likely has UTI, and is at high ris for MDR due to chemo and recent antibiotics.  [place] while being admitted will be helpful as well, as if there is metastasis, her management will change.  CT Abdomen Pelvis W Contrast [AN]    Clinical Course User Index [AN] Varney Biles, MD    Final Clinical Impressions(s) / ED Diagnoses   Final diagnoses:  Acute cystitis with hematuria  Acute lower GI bleeding  Uterine leiomyoma, unspecified location   Pt comes in with cc of abd pain. She is noted to be febrile and pt is immunosuppressed. Sepsis workup initiated. CT abd  ordered.  PT is having uterine bleeding and rectal bleeding. Ct scan from last year reviewed, and she did have multiple masses in her uterus. Anticipate admission for optimization.  New Prescriptions Current Discharge Medication List     I personally performed the services described in this documentation, which was scribed in my presence. The recorded information has been reviewed and is accurate.     Varney Biles, MD 11/14/16 [phone]

## 2016-11-13 NOTE — Telephone Encounter (Signed)
Pt said that she was seen in the ER for abdominal pain and has been taking an antibiotic. She does not have a PCP and wants to make OV with [place] as a new patient. Please advise [phone]

## 2016-11-13 NOTE — Telephone Encounter (Signed)
May schedule.

## 2016-11-13 NOTE — ED Triage Notes (Addendum)
PResents with RLQ for 2 weeks, the pain became worse tonight.  Denies N/V/D.  Endorses fever at home of 102.2, temp here 100.3 orally. HR 103, BP 121/74. Last bm this evening but had to give herself an enema, states she has a recent xray that said she was impacted and constipated. Takes Oxycodone. Cancer patient

## 2016-11-14 ENCOUNTER — Encounter: Payer: Self-pay | Admitting: Gastroenterology

## 2016-11-14 ENCOUNTER — Telehealth: Payer: Self-pay | Admitting: *Deleted

## 2016-11-14 ENCOUNTER — Encounter (HOSPITAL_COMMUNITY): Payer: Self-pay | Admitting: Family Medicine

## 2016-11-14 DIAGNOSIS — D259 Leiomyoma of uterus, unspecified: Secondary | ICD-10-CM | POA: Diagnosis not present

## 2016-11-14 DIAGNOSIS — C50912 Malignant neoplasm of unspecified site of left female breast: Secondary | ICD-10-CM | POA: Diagnosis present

## 2016-11-14 DIAGNOSIS — C50012 Malignant neoplasm of nipple and areola, left female breast: Secondary | ICD-10-CM

## 2016-11-14 DIAGNOSIS — C50011 Malignant neoplasm of nipple and areola, right female breast: Secondary | ICD-10-CM

## 2016-11-14 DIAGNOSIS — N390 Urinary tract infection, site not specified: Secondary | ICD-10-CM | POA: Diagnosis present

## 2016-11-14 DIAGNOSIS — C7801 Secondary malignant neoplasm of right lung: Secondary | ICD-10-CM | POA: Diagnosis not present

## 2016-11-14 DIAGNOSIS — G893 Neoplasm related pain (acute) (chronic): Secondary | ICD-10-CM | POA: Diagnosis not present

## 2016-11-14 DIAGNOSIS — C7951 Secondary malignant neoplasm of bone: Secondary | ICD-10-CM | POA: Diagnosis not present

## 2016-11-14 DIAGNOSIS — N3001 Acute cystitis with hematuria: Principal | ICD-10-CM

## 2016-11-14 DIAGNOSIS — N939 Abnormal uterine and vaginal bleeding, unspecified: Secondary | ICD-10-CM | POA: Diagnosis present

## 2016-11-14 DIAGNOSIS — Z87891 Personal history of nicotine dependence: Secondary | ICD-10-CM | POA: Diagnosis not present

## 2016-11-14 DIAGNOSIS — Z6822 Body mass index (BMI) 22.0-22.9, adult: Secondary | ICD-10-CM | POA: Diagnosis not present

## 2016-11-14 DIAGNOSIS — K922 Gastrointestinal hemorrhage, unspecified: Secondary | ICD-10-CM | POA: Diagnosis not present

## 2016-11-14 DIAGNOSIS — Z79899 Other long term (current) drug therapy: Secondary | ICD-10-CM | POA: Diagnosis not present

## 2016-11-14 DIAGNOSIS — C50911 Malignant neoplasm of unspecified site of right female breast: Secondary | ICD-10-CM | POA: Diagnosis present

## 2016-11-14 DIAGNOSIS — J449 Chronic obstructive pulmonary disease, unspecified: Secondary | ICD-10-CM

## 2016-11-14 DIAGNOSIS — K760 Fatty (change of) liver, not elsewhere classified: Secondary | ICD-10-CM | POA: Diagnosis present

## 2016-11-14 DIAGNOSIS — R109 Unspecified abdominal pain: Secondary | ICD-10-CM | POA: Diagnosis present

## 2016-11-14 DIAGNOSIS — G8929 Other chronic pain: Secondary | ICD-10-CM | POA: Diagnosis present

## 2016-11-14 DIAGNOSIS — E43 Unspecified severe protein-calorie malnutrition: Secondary | ICD-10-CM | POA: Diagnosis not present

## 2016-11-14 DIAGNOSIS — C7800 Secondary malignant neoplasm of unspecified lung: Secondary | ICD-10-CM | POA: Diagnosis present

## 2016-11-14 DIAGNOSIS — F419 Anxiety disorder, unspecified: Secondary | ICD-10-CM | POA: Diagnosis not present

## 2016-11-14 LAB — URINALYSIS, ROUTINE W REFLEX MICROSCOPIC
BILIRUBIN URINE: NEGATIVE
GLUCOSE, UA: NEGATIVE mg/dL
Ketones, ur: NEGATIVE mg/dL
NITRITE: NEGATIVE
PH: 7 (ref 5.0–8.0)
Protein, ur: 30 mg/dL — AB
Specific Gravity, Urine: 1.005 (ref 1.005–1.030)
Squamous Epithelial / LPF: NONE SEEN

## 2016-11-14 LAB — POC OCCULT BLOOD, ED: Fecal Occult Bld: NEGATIVE

## 2016-11-14 LAB — I-STAT CG4 LACTIC ACID, ED: LACTIC ACID, VENOUS: 1.25 mmol/L (ref 0.5–1.9)

## 2016-11-14 LAB — I-STAT TROPONIN, ED: Troponin i, poc: 0 ng/mL (ref 0.00–0.08)

## 2016-11-14 MED ORDER — LEVOFLOXACIN 500 MG PO TABS: 500.0000 mg | ORAL_TABLET | Freq: Every day | ORAL | Status: DC

## 2016-11-14 MED ORDER — LEVOFLOXACIN IN D5W 500 MG/100ML IV SOLN: 500.0000 mg | Freq: Once | INTRAVENOUS | Status: DC

## 2016-11-14 MED ORDER — SODIUM CHLORIDE 0.9% FLUSH
3.0000 mL | Freq: Two times a day (BID) | INTRAVENOUS | Status: DC
  Administered 2016-11-14 – 2016-11-16 (×5): 3 mL via INTRAVENOUS

## 2016-11-14 MED ORDER — ZOLPIDEM TARTRATE 5 MG PO TABS
5.0000 mg | ORAL_TABLET | Freq: Every day | ORAL | Status: DC
  Administered 2016-11-14 – 2016-11-15 (×2): 5 mg via ORAL
  Filled 2016-11-14 (×2): qty 1

## 2016-11-14 MED ORDER — DICYCLOMINE HCL 10 MG PO CAPS: 10.0000 mg | ORAL_CAPSULE | Freq: Three times a day (TID) | ORAL | Status: DC | PRN

## 2016-11-14 MED ORDER — LIDOCAINE-PRILOCAINE 2.5-2.5 % EX CREA: 1.0000 "application " | TOPICAL_CREAM | CUTANEOUS | Status: DC | PRN

## 2016-11-14 MED ORDER — POLYETHYLENE GLYCOL 3350 17 G PO PACK
17.0000 g | PACK | Freq: Two times a day (BID) | ORAL | Status: DC
  Administered 2016-11-14 – 2016-11-16 (×2): 17 g via ORAL
  Filled 2016-11-14 (×4): qty 1

## 2016-11-14 MED ORDER — ONDANSETRON HCL 4 MG PO TABS
4.0000 mg | ORAL_TABLET | Freq: Three times a day (TID) | ORAL | Status: DC | PRN
  Filled 2016-11-14: qty 1

## 2016-11-14 MED ORDER — BISACODYL 5 MG PO TBEC
5.0000 mg | DELAYED_RELEASE_TABLET | Freq: Two times a day (BID) | ORAL | Status: DC
  Administered 2016-11-14 – 2016-11-16 (×2): 5 mg via ORAL
  Filled 2016-11-14 (×3): qty 1

## 2016-11-14 MED ORDER — TRAMADOL HCL 50 MG PO TABS
50.0000 mg | ORAL_TABLET | Freq: Four times a day (QID) | ORAL | Status: DC | PRN
  Administered 2016-11-14 – 2016-11-16 (×6): 50 mg via ORAL
  Filled 2016-11-14 (×6): qty 1

## 2016-11-14 MED ORDER — LEVOFLOXACIN 750 MG PO TABS
750.0000 mg | ORAL_TABLET | Freq: Once | ORAL | Status: AC
  Administered 2016-11-14: 750 mg via ORAL
  Filled 2016-11-14: qty 1

## 2016-11-14 MED ORDER — OXYCODONE HCL 5 MG PO TABS
5.0000 mg | ORAL_TABLET | Freq: Four times a day (QID) | ORAL | Status: DC | PRN
  Filled 2016-11-14: qty 1

## 2016-11-14 MED ORDER — ONDANSETRON HCL 4 MG/2ML IJ SOLN: 4.0000 mg | Freq: Four times a day (QID) | INTRAMUSCULAR | Status: DC | PRN

## 2016-11-14 MED ORDER — ONDANSETRON HCL 4 MG PO TABS
4.0000 mg | ORAL_TABLET | Freq: Four times a day (QID) | ORAL | Status: DC | PRN
  Administered 2016-11-14: 4 mg via ORAL

## 2016-11-14 MED ORDER — GABAPENTIN 300 MG PO CAPS
300.0000 mg | ORAL_CAPSULE | Freq: Three times a day (TID) | ORAL | Status: DC
  Administered 2016-11-14 – 2016-11-15 (×4): 300 mg via ORAL
  Filled 2016-11-14 (×4): qty 1

## 2016-11-14 MED ORDER — SODIUM CHLORIDE 0.9% FLUSH: 3.0000 mL | INTRAVENOUS | Status: DC | PRN

## 2016-11-14 MED ORDER — SODIUM CHLORIDE 0.9 % IV SOLN: 250.0000 mL | INTRAVENOUS | Status: DC | PRN

## 2016-11-14 MED ORDER — POTASSIUM CHLORIDE CRYS ER 20 MEQ PO TBCR
40.0000 meq | EXTENDED_RELEASE_TABLET | Freq: Once | ORAL | Status: AC
  Administered 2016-11-14: 40 meq via ORAL
  Filled 2016-11-14: qty 2

## 2016-11-14 MED ORDER — DEXTROSE 5 % IV SOLN
1.0000 g | INTRAVENOUS | Status: DC
  Administered 2016-11-15: 1 g via INTRAVENOUS
  Filled 2016-11-14 (×3): qty 10

## 2016-11-14 MED ORDER — MINERAL OIL RE ENEM: 1.0000 | ENEMA | Freq: Once | RECTAL | Status: DC

## 2016-11-14 MED ORDER — LORAZEPAM 1 MG PO TABS
1.0000 mg | ORAL_TABLET | Freq: Every day | ORAL | Status: DC
  Administered 2016-11-14: 1 mg via ORAL
  Filled 2016-11-14 (×2): qty 1

## 2016-11-14 MED ORDER — HYDROMORPHONE HCL 1 MG/ML IJ SOLN
1.0000 mg | Freq: Once | INTRAMUSCULAR | Status: AC
  Administered 2016-11-14: 1 mg via INTRAVENOUS
  Filled 2016-11-14: qty 1

## 2016-11-14 MED ORDER — CALCIUM CARBONATE-VITAMIN D 500-200 MG-UNIT PO TABS
1.0000 | ORAL_TABLET | Freq: Two times a day (BID) | ORAL | Status: DC
  Administered 2016-11-14 – 2016-11-16 (×5): 1 via ORAL
  Filled 2016-11-14 (×5): qty 1

## 2016-11-14 NOTE — Progress Notes (Signed)
Paula Navarro is a 66 y.o. female with medical history significant of stage 4 breast cancer, vaginal bleeding for about a year with uterine polyp, chronic pain, copd, anxiety comes in with abdominal pain. She underwent CT abdomen and pelvis showed Interval development of retroperitoneal and retrocrural lymphadenopathy suspicious for metastatic disease. Diffuse fatty infiltration of the liver. Increasing bile duct dilatation and pancreatic ductal dilatation. Multiple large uterine fibroids, increasing in size since previous study. This may be due to interval necrosis of the lesions but leiomyosarcoma is not excluded. She was also found to have abnormal UA, she is admitted for further evaluation.   Plan:   1. Start rocephin, and get urine cultures.  2. MRCP of the abdomen.  3. Pain control.  4. Notify Dr Lindi Adie of patient's admission.  5. Outpatient follow up with with gynecology.    Hosie Poisson, MD [phone]

## 2016-11-14 NOTE — Progress Notes (Signed)
ANTIBIOTIC CONSULT NOTE  Pharmacy Consult for Levofloxacin Indication: Urinary tract infection  Allergies  Allergen Reactions  . Codeine Other (See Comments)    Hot in chest  . Morphine And Related Itching and Rash   Patient Measurements: Height: '5\' 2"'$  (157.5 cm) Weight: 123 lb 6.4 oz (56 kg) IBW/kg (Calculated) : 50.1  Vital Signs: Temp: 98.5 F (36.9 C) (04/17 0400) Temp Source: Oral (04/17 0400) BP: 123/62 (04/17 0400) Pulse Rate: 79 (04/17 0400)  Labs:  Recent Labs  11/13/16 2247  WBC 13.6*  HGB 8.4*  PLT 281  CREATININE 0.72    Estimated Creatinine Clearance: 56.9 mL/min (by C-G formula based on SCr of 0.72 mg/dL).  No results for input(s): VANCOTROUGH, VANCOPEAK, VANCORANDOM, GENTTROUGH, GENTPEAK, GENTRANDOM, TOBRATROUGH, TOBRAPEAK, TOBRARND, AMIKACINPEAK, AMIKACINTROU, AMIKACIN in the last 72 hours.   Microbiology: Recent Results (from the past 720 hour(s))  Urine culture     Status: Abnormal   Collection Time: 10/27/16  7:32 PM  Result Value Ref Range Status   Specimen Description URINE, CLEAN CATCH  Final   Special Requests NONE  Final   Culture MULTIPLE SPECIES PRESENT, SUGGEST RECOLLECTION (A)  Final   Report Status 10/29/2016 FINAL  Final  Urine Culture     Status: None   Collection Time: 11/07/16 11:16 AM  Result Value Ref Range Status   Urine Culture, Routine Final report  Final   Urine Culture result 1 No growth  Final  Culture, blood (Routine x 2)     Status: None (Preliminary result)   Collection Time: 11/13/16 10:47 PM  Result Value Ref Range Status   Specimen Description BLOOD RIGHT ARM  Final   Special Requests Blood Culture adequate volume  Final   Culture PENDING  Incomplete   Report Status PENDING  Incomplete  Culture, blood (Routine x 2)     Status: None (Preliminary result)   Collection Time: 11/14/16 12:02 AM  Result Value Ref Range Status   Specimen Description BLOOD PORT DRAW PER MD  Final   Special Requests   Final   BOTTLES DRAWN AEROBIC AND ANAEROBIC Blood Culture results may not be optimal due to an inadequate volume of blood received in culture bottles   Culture PENDING  Incomplete   Report Status PENDING  Incomplete    Medical History: Past Medical History:  Diagnosis Date  . Anemia   . Anxiety   . Atrial septal defect 1996   Surgical repair in 1996  . Breast cancer ([place])   . Chest pain    Admitted to [facility] in 09/2011; refused stress test  . Chronic bronchitis   . Chronic pain   . COPD (chronic obstructive pulmonary disease) ([place])    on xray  . Palpitation    Tachycardia reported by monitor clerk during a symptomatic spell  . Radiation 06/30/14-08/17/14   Bilateral Breast  . Tobacco abuse    60 pack years; 1.5 packs per day  . Wears dentures    top   Medications:  Zosyn 3.375 Gm IV x 1 dose in the ED on 11/13/16  Assessment: 66 yo female with hx sig for stage 4 breast cancer and worsening uterine abnormality on CT scan. Pt  To be admitted with possible UTI.  Goal of Therapy:  Eradicate infection  Plan:  Protocol will be initiated with dose of 750 mg PO levofloxacin x 1 then '500mg'$  PO q24hrs Monitor labs, progress, c/s  Hart Robinsons A, RPH 11/14/2016,7:47 AM

## 2016-11-14 NOTE — Telephone Encounter (Signed)
I tried calling patient, but she does not have her VM set up. I have mailed a letter of appointment date and time. 4/24 @ 11 am with AB

## 2016-11-14 NOTE — Telephone Encounter (Signed)
"  I'm in the hospital at [facility] [phone]). My feet hurt so bad I want to call 911 to come to [facility].  I came to the ED for my stomach pain.  I have two masses in my uterus.  They say the larger one is trying but not getting enough blood supply.They are giving me one gabapentin three times a day when at home I take two.  Dr. Lindi Adie needs to call to order what I take at home.  The pain is in both feet rate as a ten.  I have an appointment there on Thursday.(11-16-2016)"  Will notify Dr.Gudena of this hospitalization.  We can reschedule the 11-16-2016 appointments.  Wished her well.        Encouraged to press call button, tell nurse about pain in feet for further pain evaluation and intervention as ordered by doctor in charge of this hospitalization.  No further questions.

## 2016-11-14 NOTE — H&P (Signed)
History and Physical    Paula Navarro:[id number] DOB: [birth year] DOA: 11/13/2016  PCP: Lanette Hampshire, MD (Inactive)  Patient coming from:  home  Chief Complaint:   Abdominal pain  HPI: Paula Navarro is a 66 y.o. female with medical history significant of stage 4 breast cancer, vaginal bleeding for about a year with uterine polyp, chronic pain, copd, anxiety comes in with abdominal pain which is not controlled at home.  Pt found to have a fever in the ED, she was unaware of this.  She denies any n/v/d.  She denies any urinary symptoms no dysuria (this was report by dr Kathrynn Humble in ED earlier however).  Pt reports that she only takes one of her oxycodone pills a day because she gets constipated easily, she is taking mirilax daily but she is so scared to get "clogged up".  She has had vaginal bleeding for 44month to a year for which she is suppose to see dr cousins for but she hasnt made an appointment yet.  Pt denies any flank pain.  She is tolerating eating well.  She denies any recent hospitalizations or abx use.  She was thought to maybe have a uti over a week ago and her urine cx at that time did not grow anything specific and she was not given antibiotics.  Pt just wants "to know what is wrong with me".  Pt was referred for admission for possible UTI and due to being "high risk" for MDR organisms.     Review of Systems: As per HPI otherwise 10 point review of systems negative.   Past Medical History:  Diagnosis Date  . Anemia   . Anxiety   . Atrial septal defect 1996   Surgical repair in 1996  . [place] cancer ([place]   . Chest pain    Admitted to [facility] in 09/2011; refused stress test  . Chronic bronchitis   . Chronic pain   . COPD (ch[place] obstructive [place] disease) ([place]    on xray  . Palpitation    Tachycardia reported by monitor clerk during a symptomatic spell  . Radiation 06/30/14-08/17/14   Bilateral Breast  . Tobacco abuse    60 pack years; 1.5 packs per day  . Wears  dentures    top    Past Surgical History:  Procedure Laterality Date  . ASD REPAIR, OSTIUM PRIMUM  1996   dr gRoxy Horseman . AXILLARY LYMPH NODE DISSECTION Bilateral 04/08/2014   Procedure:  BILATERAL AXILLARY LYMPH NODE DISSECTION;  Surgeon: PAutumn MessingIII, MD;  [facility]  Service: General;  Laterality: Bilateral;  . BREAST BIOPSY Bilateral   . BREAST LUMPECTOMY WITH RADIOACTIVE SEED LOCALIZATION Bilateral 04/08/2014   Procedure: BILATERAL  RADIOACTIVE SEED LOCALIZATION LUMPECTOMY ;  Surgeon: PAutumn MessingIII, MD;  [facility]  Service: General;  Laterality: Bilateral;  . CESAREAN SECTION     x3  . CHOLECYSTECTOMY    . open heart surgery    . PORT A CATH REVISION  1/15   put in   . TUBAL LIGATION       reports that she quit smoking about 4 years ago. Her smoking use included Cigarettes. She has a 60.00 pack-year smoking history. She has never used smokeless tobacco. She reports that she does not drink alcohol or use drugs.  Allergies  Allergen Reactions  . Codeine Other (See Comments)    Hot in chest  . Morphine And Related Itching and Rash  Family History  Problem Relation Age of Onset  . Breast cancer Maternal Aunt     Prior to Admission medications   Medication Sig Start Date End Date Taking? Authorizing Provider  B Complex-C (SUPER B COMPLEX PO) Take 1 tablet by mouth daily.   Yes Historical Provider, MD  bisacodyl (LAXATIVE) 5 MG EC tablet Take 5 mg by mouth 2 (two) times daily.   Yes Historical Provider, MD  calcium-vitamin D (OSCAL-500) 500-400 MG-UNIT tablet Take 1 tablet by mouth 2 (two) times daily. 08/18/15  Yes Nicholas Lose, MD  dicyclomine (BENTYL) 10 MG capsule Take 1 capsule (10 mg total) by mouth 3 (three) times daily as needed for spasms. 11/07/16  Yes Gardenia Phlegm, NP  gabapentin (NEURONTIN) 300 MG capsule TAKE 2 CAPSULES (600 MG TOTAL) BY MOUTH 3 (THREE) TIMES DAILY. 08/30/16  Yes Nicholas Lose, MD    LORazepam (ATIVAN) 1 MG tablet Take 1 tablet (1 mg total) by mouth at bedtime. 11/07/16  Yes Nicholas Lose, MD  mineral oil enema Place 1 enema rectally once.   Yes Historical Provider, MD  ondansetron (ZOFRAN) 4 MG tablet Take 1 tablet (4 mg total) by mouth every 8 (eight) hours as needed for nausea or vomiting. 07/28/16  Yes Nicholas Lose, MD  oxyCODONE (OXY IR/ROXICODONE) 5 MG immediate release tablet Take 1 tablet (5 mg total) by mouth every 6 (six) hours as needed for severe pain. 11/09/16  Yes Hayden Pedro, PA-C  zolpidem (AMBIEN) 10 MG tablet Take 1 tablet (10 mg total) by mouth at bedtime. 10/26/16  Yes Nicholas Lose, MD  lidocaine-prilocaine (EMLA) cream Apply 1 application topically as needed. 10/12/16   Nicholas Lose, MD  nystatin (MYCOSTATIN) 100000 UNIT/ML suspension Take 5 mLs (500,000 Units total) by mouth 4 (four) times daily. Patient not taking: Reported on 11/09/2016 08/03/16   Nicholas Lose, MD  prochlorperazine (COMPAZINE) 10 MG tablet TAKE 1 TABLET EVERY 6 HOURS AS NEEDED NAUEA OR VOMITING Patient not taking: Reported on 11/09/2016 10/12/16   Nicholas Lose, MD    Physical Exam: Vitals:   11/14/16 0030 11/14/16 0115 11/14/16 0200 11/14/16 0230  BP: 124/69 125/67 132/71 125/78  Pulse: 83 84 80 89  Resp: '18 19 18 '$ (!) 22  Temp:      TempSrc:      SpO2: 94% 96% 94% 95%  Weight:      Height:        Constitutional: NAD, calm, comfortable Vitals:   11/14/16 0030 11/14/16 0115 11/14/16 0200 11/14/16 0230  BP: 124/69 125/67 132/71 125/78  Pulse: 83 84 80 89  Resp: '18 19 18 '$ (!) 22  Temp:      TempSrc:      SpO2: 94% 96% 94% 95%  Weight:      Height:       Eyes: PERRL, lids and conjunctivae normal ENMT: Mucous membranes are moist. Posterior pharynx clear of any exudate or lesions.Normal dentition.  Neck: normal, supple, no masses, no thyromegaly Respiratory: clear to auscultation bilaterally, no wheezing, no crackles. Normal respiratory effort. No accessory muscle use.   Cardiovascular: Regular rate and rhythm, no murmurs / rubs / gallops. No extremity edema. 2+ pedal pulses. No carotid bruits.  Abdomen: no tenderness, no masses palpated. No hepatosplenomegaly. Bowel sounds positive.  Musculoskeletal: no clubbing / cyanosis. No joint deformity upper and lower extremities. Good ROM, no contractures. Normal muscle tone.  Skin: no rashes, lesions, ulcers. No induration Neurologic: CN 2-12 grossly intact. Sensation intact, DTR normal. Strength 5/5 in  all 4.  Psychiatric: Normal judgment and insight. Alert and oriented x 3. Normal mood.    Labs on Admission: I have personally reviewed following labs and imaging studies  CBC:  Recent Labs Lab 11/07/16 1005 11/13/16 2247  WBC 10.9* 13.6*  NEUTROABS 7.4*  --   HGB 8.0* 8.4*  HCT 25.3* 26.5*  MCV 70.7* 71.2*  PLT 118* 875   Basic Metabolic Panel:  Recent Labs Lab 11/07/16 1005 11/13/16 2247  NA 137 132*  K 3.4* 3.1*  CL  --  96*  CO2 26 25  GLUCOSE 91 106*  BUN 6.5* 7  CREATININE 0.8 0.72  CALCIUM 10.0 9.0   GFR: Estimated Creatinine Clearance: 62.2 mL/min (by C-G formula based on SCr of 0.72 mg/dL). Liver Function Tests:  Recent Labs Lab 11/07/16 1005 11/13/16 2247  AST 46* 51*  ALT 19 18  ALKPHOS 76 60  BILITOT 0.48 0.8  PROT 7.4 7.4  ALBUMIN 3.0* 2.9*    Recent Labs Lab 11/13/16 2247  LIPASE 16   No results for input(s): AMMONIA in the last 168 hours. Coagulation Profile:  Recent Labs Lab 11/13/16 2247  INR 1.17   Urine analysis:    Component Value Date/Time   COLORURINE YELLOW 11/14/2016 0020   APPEARANCEUR CLOUDY (A) 11/14/2016 0020   LABSPEC 1.005 11/14/2016 0020   LABSPEC 1.020 11/07/2016 1005   PHURINE 7.0 11/14/2016 0020   GLUCOSEU NEGATIVE 11/14/2016 0020   GLUCOSEU Negative 11/07/2016 1005   HGBUR LARGE (A) 11/14/2016 0020   BILIRUBINUR NEGATIVE 11/14/2016 0020   BILIRUBINUR Negative 11/07/2016 1005   KETONESUR NEGATIVE 11/14/2016 0020   PROTEINUR  30 (A) 11/14/2016 0020   UROBILINOGEN 0.2 11/07/2016 1005   NITRITE NEGATIVE 11/14/2016 0020   LEUKOCYTESUR LARGE (A) 11/14/2016 0020   LEUKOCYTESUR Color Interference 11/07/2016 1005    Recent Results (from the past 240 hour(s))  Urine Culture     Status: None   Collection Time: 11/07/16 11:16 AM  Result Value Ref Range Status   Urine Culture, Routine Final report  Final   Urine Culture result 1 No growth  Final  Culture, blood (Routine x 2)     Status: None (Preliminary result)   Collection Time: 11/14/16 12:02 AM  Result Value Ref Range Status   Specimen Description BLOOD PORT DRAW PER MD  Final   Special Requests   Final    BOTTLES DRAWN AEROBIC AND ANAEROBIC Blood Culture results may not be optimal due to an inadequate volume of blood received in culture bottles   Culture PENDING  Incomplete   Report Status PENDING  Incomplete     Radiological Exams on Admission: Ct Abdomen Pelvis W Contrast  Result Date: 11/14/2016 CLINICAL DATA:  Lower abdominal pain for 2 weeks. Worse tonight. Constipation, hematochezia, dizziness, and rectal bleeding. Rectal bleeding for 6-12 months. History of breast cancer and chronic pain. EXAM: CT ABDOMEN AND PELVIS WITH CONTRAST TECHNIQUE: Multidetector CT imaging of the abdomen and pelvis was performed using the standard protocol following bolus administration of intravenous contrast. CONTRAST:  188m ISOVUE-300 IOPAMIDOL (ISOVUE-300) INJECTION 61% COMPARISON:  01/26/2016 FINDINGS: Lower chest: Postoperative changes and scarring in the right breast. Small right pleural effusion. Bilateral basilar atelectasis. 3 mm nodule in the right middle lung anteriorly. This was not identified on the previous study. Developing pulmonary nodule should be excluded. Mild scarring in the right lung anteriorly is probably due to radiation change. Hepatobiliary: Diffuse fatty infiltration of the liver. No focal lesions identified. Surgical absence of the  gallbladder. Intra and  extrahepatic bile duct dilatation has progressed somewhat since previous study but could be related to postoperative change. No obstructing mass or stone is identified. Pancreas: Mild diffuse pancreatic ductal dilatation without change. Pancreatic parenchymal appearance is unremarkable. No mass lesions identified. Spleen: Normal in size without focal abnormality. Adrenals/Urinary Tract: No adrenal gland nodules. Renal nephrograms are symmetrical. No hydronephrosis or hydroureter. Mild bladder wall thickening could indicate cystitis. No filling defects. Stomach/Bowel: Stomach and small bowel are normal for degree of distention. Colon is decompressed. No discrete colonic wall thickening or inflammatory reaction. Appendix is not identified. Vascular/Lymphatic: Aortic atherosclerosis with scattered calcification. No aortic aneurysm. Normal caliber IVC. Interval development of diffuse soft tissue in the retroperitoneum and retrocrural spaces likely representing developing lymphadenopathy. Appearance is suspicious for metastatic disease. Reproductive: Prominent heterogeneous nodular enlargement of the uterus consistent with fibroids. Some of the fibroids are calcified and some demonstrate low attenuation consistent with necrosis. Largest measures about 7.8 x 8 cm in diameter. Size is increasing since previous study. This could be due to necrosis and hemorrhage within the fibroids. Sarcomatous degeneration is not excluded. Ovaries are not enlarged. Prominent pelvic varices. Other: No free air or free fluid in the abdomen. Abdominal wall musculature appears intact. Fat density in the right groin and likely represents lipoma. Musculoskeletal: No destructive bone lesions. IMPRESSION: 1. No definite evidence of bowel obstruction or inflammation although under distention limits evaluation of the bowel wall. 2. Interval development of retroperitoneal and retrocrural lymphadenopathy suspicious for metastatic disease. 3. 3 mm  nodule developing in the right middle lung anteriorly. Suggest six-month follow-up. 4. Small right pleural effusion. 5. Diffuse fatty infiltration of the liver. Increasing bile duct dilatation and pancreatic ductal dilatation. This may be physiologic after cholecystectomy but occult obstructing lesion is not excluded. Consider further evaluation with MRI/MRCP in follow-up. 6. Multiple large uterine fibroids, increasing in size since previous study. This may be due to interval necrosis of the lesions but leiomyosarcoma is not excluded. Electronically Signed   By: Lucienne Capers M.D.   On: 11/14/2016 01:42   Old chart reviewed Case discussed with dr Timmothy Euler in ED   Assessment/Plan 66 yo female with stage 4 breast cancer comes in with possible UTI, fever, and uncontrolled pain with worsening uterine abnormality on ct scan  Principal Problem:   UTI (urinary tract infection)- place on oral levaquin.  Pt not septic.  Lactic normal.  bp normal.  Mild tachycardia likely due to fever and pain.   Active Problems:   Bilateral breast cancer ([place])- noted, cont palliative chemo   Anxiety- noted   Chronic pain- pt would like to try something else besides oxycodone.  Hydrocodone causes her to be nauseated.  She has never tried ultram.  Will try that.  She is on minimal pain meds at home due to fear of constipation   Bone metastases ([place])- noted   Lung metastases ([place])- noted   Abnormal vaginal bleeding with fibroids on ct scan /necrotic?- cont to follow up with gynecology as oupatient.  This could be new primary tumor.  Pt has an overall poor understanding and insight into the severity of her cancer.  She is suppose to start radiation to her breast again next week.     DVT prophylaxis:  scds Code Status:  full Family Communication:  none  Disposition Plan:  Per day team Consults called:  none Admission status:  observation   DAVID,RACHAL A MD [facility]  If 7PM-7AM, please contact  night-coverage [email or web address]  Password [email or web address]  11/14/2016, 3:17 AM

## 2016-11-14 NOTE — Care Management Note (Signed)
Case Management Note  Patient Details  Name: Paula Navarro MRN: [id number] Date of Birth: 05/02/1951  Subjective/Objective:                  Pt admitted with UTI. She is from home, lives alone and has grandson who stays with her often. She is ind with ADL's. She has PCP, transportation to appointments and insurance with drug coverage. She has no HH or DME needs pta.   Action/Plan: Pt plans to return home with self care.   Expected Discharge Date:  11/15/16               Expected Discharge Plan:  Home/Self Care  In-House Referral:  NA  Discharge planning Services  CM Consult  Post Acute Care Choice:  NA Choice offered to:  NA  Status of Service:  Completed, signed off  Sherald Barge, RN 11/14/2016, 2:01 PM

## 2016-11-14 NOTE — Care Management Obs Status (Signed)
[place] NOTIFICATION   Patient Details  Name: Paula Navarro MRN: [id number] Date of Birth: 1950/12/04   Medicare Observation Status Notification Given:  Yes    Sherald Barge, RN 11/14/2016, 2:00 PM

## 2016-11-14 NOTE — Progress Notes (Signed)
ANTIBIOTIC CONSULT NOTE-Preliminary  Pharmacy Consult for Levofloxacin Indication: Urinary tract infection  Allergies  Allergen Reactions  . Codeine Other (See Comments)    Hot in chest  . Morphine And Related Itching and Rash    Patient Measurements: Height: '5\' 2"'$  (157.5 cm) Weight: 123 lb 6.4 oz (56 kg) IBW/kg (Calculated) : 50.1  Vital Signs: Temp: 98.5 F (36.9 C) (04/17 0400) Temp Source: Oral (04/17 0400) BP: 123/62 (04/17 0400) Pulse Rate: 79 (04/17 0400)  Labs:  Recent Labs  11/13/16 2247  WBC 13.6*  HGB 8.4*  PLT 281  CREATININE 0.72    Estimated Creatinine Clearance: 56.9 mL/min (by C-G formula based on SCr of 0.72 mg/dL).  No results for input(s): VANCOTROUGH, VANCOPEAK, VANCORANDOM, GENTTROUGH, GENTPEAK, GENTRANDOM, TOBRATROUGH, TOBRAPEAK, TOBRARND, AMIKACINPEAK, AMIKACINTROU, AMIKACIN in the last 72 hours.   Microbiology: Recent Results (from the past 720 hour(s))  Urine culture     Status: Abnormal   Collection Time: 10/27/16  7:32 PM  Result Value Ref Range Status   Specimen Description URINE, CLEAN CATCH  Final   Special Requests NONE  Final   Culture MULTIPLE SPECIES PRESENT, SUGGEST RECOLLECTION (A)  Final   Report Status 10/29/2016 FINAL  Final  Urine Culture     Status: None   Collection Time: 11/07/16 11:16 AM  Result Value Ref Range Status   Urine Culture, Routine Final report  Final   Urine Culture result 1 No growth  Final  Culture, blood (Routine x 2)     Status: None (Preliminary result)   Collection Time: 11/13/16 10:47 PM  Result Value Ref Range Status   Specimen Description BLOOD RIGHT ARM  Final   Special Requests Blood Culture adequate volume  Final   Culture PENDING  Incomplete   Report Status PENDING  Incomplete  Culture, blood (Routine x 2)     Status: None (Preliminary result)   Collection Time: 11/14/16 12:02 AM  Result Value Ref Range Status   Specimen Description BLOOD PORT DRAW PER MD  Final   Special Requests    Final    BOTTLES DRAWN AEROBIC AND ANAEROBIC Blood Culture results may not be optimal due to an inadequate volume of blood received in culture bottles   Culture PENDING  Incomplete   Report Status PENDING  Incomplete    Medical History: Past Medical History:  Diagnosis Date  . Anemia   . Anxiety   . Atrial septal defect 1996   Surgical repair in 1996  . Breast cancer ([place])   . Chest pain    Admitted to [facility] in 09/2011; refused stress test  . Chronic bronchitis   . Chronic pain   . COPD (chronic obstructive pulmonary disease) ([place])    on xray  . Palpitation    Tachycardia reported by monitor clerk during a symptomatic spell  . Radiation 06/30/14-08/17/14   Bilateral Breast  . Tobacco abuse    60 pack years; 1.5 packs per day  . Wears dentures    top    Medications:  Zosyn 3.375 Gm IV x 1 dose in the ED on 11/13/16  Assessment: 66 yo female with hx sig for stage 4 breast cancer and worsening uterine abnormality on CT scan. Pt  To be admitted with possible UTI.  Goal of Therapy:  Eradicate infection  Plan:  Preliminary review of pertinent patient information completed.  Protocol will be initiated with dose of 750 mg PO levofloxacin.  Forestine Na clinical pharmacist will complete review during morning rounds to  assess patient and finalize treatment regimen if needed.  Norberto Sorenson, [facility] 11/14/2016,4:28 AM

## 2016-11-15 ENCOUNTER — Inpatient Hospital Stay (HOSPITAL_COMMUNITY): Payer: Medicare Other

## 2016-11-15 ENCOUNTER — Other Ambulatory Visit: Payer: Medicare Other

## 2016-11-15 DIAGNOSIS — K922 Gastrointestinal hemorrhage, unspecified: Secondary | ICD-10-CM

## 2016-11-15 DIAGNOSIS — D259 Leiomyoma of uterus, unspecified: Secondary | ICD-10-CM

## 2016-11-15 DIAGNOSIS — E43 Unspecified severe protein-calorie malnutrition: Secondary | ICD-10-CM | POA: Insufficient documentation

## 2016-11-15 DIAGNOSIS — C7801 Secondary malignant neoplasm of right lung: Secondary | ICD-10-CM

## 2016-11-15 MED ORDER — GABAPENTIN 300 MG PO CAPS
600.0000 mg | ORAL_CAPSULE | Freq: Three times a day (TID) | ORAL | Status: DC
  Administered 2016-11-15 – 2016-11-16 (×2): 600 mg via ORAL
  Filled 2016-11-15: qty 2
  Filled 2016-11-15: qty 6

## 2016-11-15 MED ORDER — GADOBENATE DIMEGLUMINE 529 MG/ML IV SOLN
10.0000 mL | Freq: Once | INTRAVENOUS | Status: AC | PRN
  Administered 2016-11-15: 10 mL via INTRAVENOUS

## 2016-11-15 NOTE — Progress Notes (Signed)
PROGRESS NOTE    Paula Navarro  VXY:[id number] DOB: 1951-03-06 DOA: 11/13/2016 PCP: Lanette Hampshire, MD (Inactive)    Brief Narrative:  Paula Navarro is a 66 y.o. female with medical history significant of stage 4 breast cancer, vaginal bleeding for about a year with uterine polyp, chronic pain, copd, anxiety comes in with abdominal pain which is not controlled at home.  Pt found to have a fever in the ED, she was unaware of this.  She denies any n/v/d.  She denies any urinary symptoms no dysuria (this was report by dr Kathrynn Humble in ED earlier however).  Pt reports that she only takes one of her oxycodone pills a day because she gets constipated easily, she is taking mirilax daily but she is so scared to get "clogged up".  She has had vaginal bleeding for 21month to a year for which she is suppose to see dr cousins for but she hasnt made an appointment yet.  Pt denies any flank pain.  She is tolerating eating well.  She denies any recent hospitalizations or abx use.  She was thought to maybe have a uti over a week ago and her urine cx at that time did not grow anything specific and she was not given antibiotics.  Pt just wants "to know what is wrong with me".  Pt was referred for admission for possible UTI and due to being "high risk" for MDR organisms.     Assessment & Plan:   Principal Problem:   UTI (urinary tract infection) Active Problems:   Bilateral breast cancer (HCC)   Anxiety   Chronic pain   Bone metastases (HCC)   Lung metastases (HCC)   Protein-calorie malnutrition, severe   UTI (urinary tract infection) - place on oral levaquin - pt not septic - lactic normal - BP normal - Mild tachycardia likely due to fever and pain - urine cultures pending? Unclear if these were ordered - will continue rocephin at this time  Bilateral breast cancer ([place] - noted, cont palliative chemo - will need to call Dr. GGeralyn Flashoffice tomorrow  Anxiety - noted  Chronic pain - pt would  like to try something else besides oxycodone - Ultram working - patient asking for increase in her gabapentin    [place] ([place]- [place]    [place]   Abnormal vaginal bleeding with fibroids on ct scan /necrotic? - cont to follow up with gynecology as oupatient - this could be new primary tumor.    DVT prophylaxis:  scds Code Status:  full Family Communication:  none  Disposition Plan:  likely d/c home in am if able to tolerate PO and after discussing case with oncology  Consultants:   None  Procedures:   none  Antimicrobials:   Ceftriaxone    Subjective: Patient standing by sink washing hands.  States she wanted to clean her hands prior to eating.  Patient seems very concerned about lymph nodes in her abdomen and possible lesions seen in pancreas on her CT.  She says she will try to follow up with her GYN outpatient but isn't sure if she needs to be aggressive in getting anything there treated as she knows she had fibroids years ago.  Objective: Vitals:   11/14/16 2127 11/15/16 0122 11/15/16 0546 11/15/16 1428  BP: 124/66 103/60 (!) 106/59 112/66  Pulse: 89 (!) 105 88 84  Resp: '18 18 18 18  '$ Temp: 99.5 F (37.5 C) (!) 100.7 F (38.2 C) 99.2  F (37.3 C) 98.7 F (37.1 C)  TempSrc: Oral Oral Oral Oral  SpO2: 99% 94% 97% 98%  Weight:      Height:        Intake/Output Summary (Last 24 hours) at 11/15/16 1728 Last data filed at 11/15/16 0900  Gross per 24 hour  Intake              648 ml  Output              300 ml  Net              348 ml   Filed Weights   11/13/16 2310 11/14/16 0400  Weight: 55 kg (121 lb 3.2 oz) 56 kg (123 lb 6.4 oz)    Examination:  General exam: Appears calm and comfortable  Respiratory system: Clear to auscultation. Respiratory effort normal. Cardiovascular system: S1 & S2 heard, RRR. No JVD, murmurs, rubs, gallops or clicks. No pedal edema. Gastrointestinal system: Abdomen is nondistended, soft and  nontender. No organomegaly or masses felt. Normal bowel sounds heard. Central nervous system: Alert and oriented. No focal neurological deficits. Extremities: Symmetric 5 x 5 power. Skin: No rashes, lesions or ulcers Psychiatry: Judgement and insight appear normal. Mood & affect appropriate.     Data Reviewed: I have personally reviewed following labs and imaging studies  CBC:  Recent Labs Lab 11/13/16 2247  WBC 13.6*  HGB 8.4*  HCT 26.5*  MCV 71.2*  PLT 494   Basic Metabolic Panel:  Recent Labs Lab 11/13/16 2247  NA 132*  K 3.1*  CL 96*  CO2 25  GLUCOSE 106*  BUN 7  CREATININE 0.72  CALCIUM 9.0   GFR: Estimated Creatinine Clearance: 56.9 mL/min (by C-G formula based on SCr of 0.72 mg/dL). Liver Function Tests:  Recent Labs Lab 11/13/16 2247  AST 51*  ALT 18  ALKPHOS 60  BILITOT 0.8  PROT 7.4  ALBUMIN 2.9*    Recent Labs Lab 11/13/16 2247  LIPASE 16   No results for input(s): AMMONIA in the last 168 hours. Coagulation Profile:  Recent Labs Lab 11/13/16 2247  INR 1.17   Cardiac Enzymes: No results for input(s): CKTOTAL, CKMB, CKMBINDEX, TROPONINI in the last 168 hours. BNP (last 3 results) No results for input(s): PROBNP in the last 8760 hours. HbA1C: No results for input(s): HGBA1C in the last 72 hours. CBG: No results for input(s): GLUCAP in the last 168 hours. Lipid Profile: No results for input(s): CHOL, HDL, LDLCALC, TRIG, CHOLHDL, LDLDIRECT in the last 72 hours. Thyroid Function Tests: No results for input(s): TSH, T4TOTAL, FREET4, T3FREE, THYROIDAB in the last 72 hours. Anemia Panel: No results for input(s): VITAMINB12, FOLATE, FERRITIN, TIBC, IRON, RETICCTPCT in the last 72 hours. Sepsis Labs:  Recent Labs Lab 11/13/16 2304 11/14/16 0158  LATICACIDVEN 1.39 1.25    Recent Results (from the past 240 hour(s))  Urine Culture     Status: None   Collection Time: 11/07/16 11:16 AM  Result Value Ref Range Status   Urine Culture,  Routine Final report  Final   Urine Culture result 1 No growth  Final  Culture, blood (Routine x 2)     Status: None (Preliminary result)   Collection Time: 11/13/16 10:47 PM  Result Value Ref Range Status   Specimen Description BLOOD RIGHT ARM  Final   Special Requests Blood Culture adequate volume  Final   Culture NO GROWTH 2 DAYS  Final   Report Status PENDING  Incomplete  Culture,  blood (Routine x 2)     Status: None (Preliminary result)   Collection Time: 11/14/16 12:02 AM  Result Value Ref Range Status   Specimen Description BLOOD PORT DRAW PER MD  Final   Special Requests   Final    BOTTLES DRAWN AEROBIC AND ANAEROBIC Blood Culture results may not be optimal due to an inadequate volume of blood received in culture bottles   Culture NO GROWTH 1 DAY  Final   Report Status PENDING  Incomplete         Radiology Studies: Ct Abdomen Pelvis W Contrast  Result Date: 11/14/2016 CLINICAL DATA:  Lower abdominal pain for 2 weeks. Worse tonight. Constipation, hematochezia, dizziness, and rectal bleeding. Rectal bleeding for 6-12 months. History of breast cancer and chronic pain. EXAM: CT ABDOMEN AND PELVIS WITH CONTRAST TECHNIQUE: Multidetector CT imaging of the abdomen and pelvis was performed using the standard protocol following bolus administration of intravenous contrast. CONTRAST:  167m ISOVUE-300 IOPAMIDOL (ISOVUE-300) INJECTION 61% COMPARISON:  01/26/2016 FINDINGS: Lower chest: Postoperative changes and scarring in the right breast. Small right pleural effusion. Bilateral basilar atelectasis. 3 mm nodule in the right middle lung anteriorly. This was not identified on the previous study. Developing pulmonary nodule should be excluded. Mild scarring in the right lung anteriorly is probably due to radiation change. Hepatobiliary: Diffuse fatty infiltration of the liver. No focal lesions identified. Surgical absence of the gallbladder. Intra and extrahepatic bile duct dilatation has  progressed somewhat since previous study but could be related to postoperative change. No obstructing mass or stone is identified. Pancreas: Mild diffuse pancreatic ductal dilatation without change. Pancreatic parenchymal appearance is unremarkable. No mass lesions identified. Spleen: Normal in size without focal abnormality. Adrenals/Urinary Tract: No adrenal gland nodules. Renal nephrograms are symmetrical. No hydronephrosis or hydroureter. Mild bladder wall thickening could indicate cystitis. No filling defects. Stomach/Bowel: Stomach and small bowel are normal for degree of distention. Colon is decompressed. No discrete colonic wall thickening or inflammatory reaction. Appendix is not identified. Vascular/Lymphatic: Aortic atherosclerosis with scattered calcification. No aortic aneurysm. Normal caliber IVC. Interval development of diffuse soft tissue in the retroperitoneum and retrocrural spaces likely representing developing lymphadenopathy. Appearance is suspicious for metastatic disease. Reproductive: Prominent heterogeneous nodular enlargement of the uterus consistent with fibroids. Some of the fibroids are calcified and some demonstrate low attenuation consistent with necrosis. Largest measures about 7.8 x 8 cm in diameter. Size is increasing since previous study. This could be due to necrosis and hemorrhage within the fibroids. Sarcomatous degeneration is not excluded. Ovaries are not enlarged. Prominent pelvic varices. Other: No free air or free fluid in the abdomen. Abdominal wall musculature appears intact. Fat density in the right groin and likely represents lipoma. Musculoskeletal: No destructive bone lesions. IMPRESSION: 1. No definite evidence of bowel obstruction or inflammation although under distention limits evaluation of the bowel wall. 2. Interval development of retroperitoneal and retrocrural lymphadenopathy suspicious for metastatic disease. 3. 3 mm nodule developing in the right middle lung  anteriorly. Suggest six-month follow-up. 4. Small right pleural effusion. 5. Diffuse fatty infiltration of the liver. Increasing bile duct dilatation and pancreatic ductal dilatation. This may be physiologic after cholecystectomy but occult obstructing lesion is not excluded. Consider further evaluation with MRI/MRCP in follow-up. 6. Multiple large uterine fibroids, increasing in size since previous study. This may be due to interval necrosis of the lesions but leiomyosarcoma is not excluded. Electronically Signed   By: WLucienne CapersM.D.   On: 11/14/2016 01:42   Mr 3d  Recon At Scanner  Result Date: 11/15/2016 CLINICAL DATA:  Biliary duct dilatation seen on CT scan. EXAM: MRI ABDOMEN WITHOUT AND WITH CONTRAST TECHNIQUE: Multiplanar multisequence MR imaging of the abdomen was performed both before and after the administration of intravenous contrast. CONTRAST:  59m MULTIHANCE GADOBENATE DIMEGLUMINE 529 MG/ML IV SOLN COMPARISON:  CT 11/14/2016 FINDINGS: Markedly motion degraded study. Lower chest: Unremarkable. Hepatobiliary: No suspicious enhancing lesion within the liver parenchyma. Gallbladder surgically absent. Intra and extrahepatic biliary duct dilatation again noted. Extrahepatic common duct measures 10 mm diameter. Common bile duct in the pancreatic head is 5 mm diameter. No evidence for choledocholithiasis. Pancreas: Mild prominence of the main pancreatic duct evident. Scattered homogeneous T2 hyperintense cystic foci noted in the pancreas. 8 mm lesion noted pancreatic tail image 1727) without definite communication to the main duct. 8 mm cyst near the body tail junction also without definite communication to the main duct. Spleen:  No splenomegaly. No focal mass lesion. Adrenals/Urinary Tract: No adrenal nodule or mass. Tiny cortical cysts noted both kidneys. Stomach/Bowel: Stomach is nondistended. No gastric wall thickening. No evidence of outlet obstruction. Duodenum is normally positioned as is  the ligament of Treitz. No small bowel or colonic dilatation within the visualized abdomen. Vascular/Lymphatic: No abdominal aortic aneurysm. Confluent periaortic lymphadenopathy is evident, as seen on recent CT scan. Some of the lymph nodes appear necrotic and there is displacement of the aorta and IVC secondary to mass-effect. Other:  No intraperitoneal free fluid. Musculoskeletal: No abnormal marrow enhancement within the visualized bony anatomy. IMPRESSION: 1. Mild intra and extrahepatic biliary duct dilatation without evidence for choledocholithiasis. 2. Mild distention of the main pancreatic duct diffusely. There is no discrete pancreatic head mass evident. Patient does have small simple appearing cystic lesions in the pancreatic parenchyma. Repeat MRI in 12 months recommended to confirm stability. This recommendation follows ACR consensus guidelines: Management of Incidental Pancreatic Cysts: A White Paper of the ACR Incidental Findings Committee. J Am Coll Radiol 27628;31:517-616 3. Confluent retroperitoneal lymphadenopathy in the upper abdomen. Metastatic disease and lymphoma would be primary considerations. Electronically Signed   By: EMisty StanleyM.D.   On: 11/15/2016 10:55   Mr Abdomen Mrcp WMoise BoringContast  Result Date: 11/15/2016 CLINICAL DATA:  Biliary duct dilatation seen on CT scan. EXAM: MRI ABDOMEN WITHOUT AND WITH CONTRAST TECHNIQUE: Multiplanar multisequence MR imaging of the abdomen was performed both before and after the administration of intravenous contrast. CONTRAST:  115mMULTIHANCE GADOBENATE DIMEGLUMINE 529 MG/ML IV SOLN COMPARISON:  CT 11/14/2016 FINDINGS: Markedly motion degraded study. Lower chest: Unremarkable. Hepatobiliary: No suspicious enhancing lesion within the liver parenchyma. Gallbladder surgically absent. Intra and extrahepatic biliary duct dilatation again noted. Extrahepatic common duct measures 10 mm diameter. Common bile duct in the pancreatic head is 5 mm diameter. No  evidence for choledocholithiasis. Pancreas: Mild prominence of the main pancreatic duct evident. Scattered homogeneous T2 hyperintense cystic foci noted in the pancreas. 8 mm lesion noted pancreatic tail image 1727) without definite communication to the main duct. 8 mm cyst near the body tail junction also without definite communication to the main duct. Spleen:  No splenomegaly. No focal mass lesion. Adrenals/Urinary Tract: No adrenal nodule or mass. Tiny cortical cysts noted both kidneys. Stomach/Bowel: Stomach is nondistended. No gastric wall thickening. No evidence of outlet obstruction. Duodenum is normally positioned as is the ligament of Treitz. No small bowel or colonic dilatation within the visualized abdomen. Vascular/Lymphatic: No abdominal aortic aneurysm. Confluent periaortic lymphadenopathy is evident, as seen on recent  CT scan. Some of the lymph nodes appear necrotic and there is displacement of the aorta and IVC secondary to mass-effect. Other:  No intraperitoneal free fluid. Musculoskeletal: No abnormal marrow enhancement within the visualized bony anatomy. IMPRESSION: 1. Mild intra and extrahepatic biliary duct dilatation without evidence for choledocholithiasis. 2. Mild distention of the main pancreatic duct diffusely. There is no discrete pancreatic head mass evident. Patient does have small simple appearing cystic lesions in the pancreatic parenchyma. Repeat MRI in 12 months recommended to confirm stability. This recommendation follows ACR consensus guidelines: Management of Incidental Pancreatic Cysts: A White Paper of the ACR Incidental Findings Committee. J Am Coll Radiol 8144;81:856-314. 3. Confluent retroperitoneal lymphadenopathy in the upper abdomen. Metastatic disease and lymphoma would be primary considerations. Electronically Signed   By: Misty Stanley M.D.   On: 11/15/2016 10:55        Scheduled Meds: . bisacodyl  5 mg Oral BID  . calcium-vitamin D  1 tablet Oral BID  .  gabapentin  300 mg Oral TID  . LORazepam  1 mg Oral QHS  . mineral oil  1 enema Rectal Once  . polyethylene glycol  17 g Oral BID  . sodium chloride flush  3 mL Intravenous Q12H  . zolpidem  5 mg Oral QHS   Continuous Infusions: . sodium chloride    . cefTRIAXone (ROCEPHIN)  IV Stopped (11/15/16 0630)     LOS: 1 day    Time spent: 30 minutes    Loretha Stapler, MD [facility] Pager [phone]  If 7PM-7AM, please contact night-coverage [email or web address] Password [facility] 11/15/2016, 5:28 PM

## 2016-11-15 NOTE — Progress Notes (Signed)
At approximately 0118, Elyse, NT entered patient's room after patient pushed call button. Patient told NT and this RN that she fell onto floor in her room. Patient states that she was sitting on stool in her room, trying to remove SCD's. Patient was getting up independently. Now patient is encouraged to call for assistance and not get up on her own. Bed alarm on. VS per flow sheet. Steffanie Dunn, Agricultural consultant notified. Dr. Shanon Brow text paged for notification. Patient did not want fall reported, stating that she was not hurt at all. No injury noted to skin. Patient denies pain. Patient unsure why she was sitting on stool to take off SCD's instead of doing this while in bed. Patient states that she tries to do too much at times. Will continue to monitor.

## 2016-11-15 NOTE — Progress Notes (Addendum)
Initial Nutrition Assessment  DOCUMENTATION CODES:  Severe malnutrition in context of social or environmental circumstances (Her chronic disease is also undoubtedly playing role)  INTERVENTION:  Boost nutritional supplement BID  Ice cream/Sherbet on Meal trays  Education on Oncologic Nutrition-weight maintenance, high protein diet  Handouts "Increasing calories and protein" and "Soft and Moist High protein Menu Ideas"  Referred Patient to [place] and Social worker for follow up  NUTRITION DIAGNOSIS:  Malnutrition (Severe) related to social / environmental circumstances, cancer and cancer related treatments, poor appetite as evidenced by an estimated energy intake that has met < or equal to 50% of needs for > or equal to 1 month and a wt loss of >10% in <6 months  GOAL:  Patient will meet greater than or equal to 90% of their needs   MONITOR:  PO intake, Supplement acceptance, Labs, Weight trends  REASON FOR ASSESSMENT:  Malnutrition Screening Tool    ASSESSMENT:  65 y/o female PMHx Stage 4 breast Cancer, Vaginal Bleeding for past year, chronic pain, COPD, Anxiety. Presents with abdominal pain that was not controlled at home. Worked up for UTI.   Pt reports that she eats 4-5x a day, but over the past few months, these meal opportunities are just bites of foods. She did not drink supplements.   Her poor intake at  home, however, this is reportedly not from her cancer or treatments, but from severe stress related to the loss of her mother, whom she resided with, in February. She is still actively grieving and sounds to have had a very difficult time recently. She also is very stressed about her disease process and how they are going to cure it.   She denies any n/v/d. She has had severe constipation in the past when taking oxycodone, but says she is much better on the Ultram. Despite her chronic bleeding from vagina and rectum, she says her energy is fair. She believes she is  more tired due to depression.   She had been 135-140 lbs up until mid December. Has lost 15 lbs since then. Has lost >10% bw in 6 months.   Her dietary habits are backwards for a cancer pt who is losing weight. She sounds to have received much of her nutritional directions from her daughter. She eats mostly fruits and vegetables and drinks almost exclusively water.   RD spent a large amount of time discussing an appropriate diet for herself, namely, one that is high in kcals/protein. Recommendations included: 1. Eating a protein containing source at every meal.  2. Drink KCAL containing beverages instead of water 3. Add dressings, condiments, extras to meals 4. Start consuming Supplements: Ensure/Boost (discussed supplement program through the cancer center) 5. Continue eating 5-6 small meals /day  She is a very motivated individual and says she has had gone through a lot, but "is willing to go through a lot more to stay on this earth". She really wants to eat the best she can and to regain her lost weight.   As previously mentioned on MD note,unfortunately, she has very poor understanding of her disease process. She is restarting radiation treatment this coming week.   While admitted, her PO intake has been variable. 2x ate 0% "due to her fever" and other times >50%. She was agreeable to Boost supplements (does not like Ensure much) as well as Ice cream/sorbet on her meal trays.   She mentioned oppurtunities to get out of the house which RD encouraged her to pursue.  Should help get her mind of her loss. RD advised he would notify CSW and RD at [facility] cancer centers.   Physical Exam: WDL.   Labs: WBC: 13.6, Albumin: 2.9, K: 3.1, H/H:8.4/26.5 Meds: Calcium w/ D, Dulcolax, Ativan, Mineral oil enema, miralax, IV abx, uLTRAM  Recent Labs Lab 11/13/16 2247  NA 132*  K 3.1*  CL 96*  CO2 25  BUN 7  CREATININE 0.72  CALCIUM 9.0  GLUCOSE 106*    Diet Order:  Diet Heart Room service  appropriate? Yes; Fluid consistency: Thin  Skin:  Reviewed, no issues  Last BM:  4/17  Height:   Ht Readings from Last 1 Encounters:  11/14/16 _0  (1.575 m)   Weight:  Wt Readings from Last 1 Encounters:  11/14/16 123 lb 6.4 oz (56 kg)   Wt Readings from Last 10 Encounters:  11/14/16 123 lb 6.4 oz (56 kg)  11/09/16 121 lb 3.2 oz (55 kg)  11/07/16 121 lb 6.4 oz (55.1 kg)  10/27/16 121 lb (54.9 kg)  10/26/16 121 lb 12.8 oz (55.2 kg)  10/12/16 129 lb 6 oz (58.7 kg)  09/21/16 127 lb 6.4 oz (57.8 kg)  08/24/16 131 lb 11.2 oz (59.7 kg)  08/03/16 132 lb 14.4 oz (60.3 kg)  07/10/16 136 lb (61.7 kg)   Ideal Body Weight:  50 kg  BMI:  Body mass index is 22.57 kg/m.  Estimated Nutritional Needs:  Kcal:  1700-1900 (30-34 kcal/kg bw) Protein:  73-84g (1.3-1.5 g/kg bw) Fluid:  >1.7 L (30 ml/kg bw)  EDUCATION NEEDS:  Education needs addressed  Burtis Junes RD, LDN, CNSC Clinical Nutrition Pager: [phone] 11/15/2016 3:29 PM

## 2016-11-16 ENCOUNTER — Other Ambulatory Visit: Payer: Medicare Other

## 2016-11-16 ENCOUNTER — Ambulatory Visit: Payer: Medicare Other

## 2016-11-16 ENCOUNTER — Ambulatory Visit: Payer: Medicare Other | Admitting: Hematology and Oncology

## 2016-11-16 DIAGNOSIS — C7951 Secondary malignant neoplasm of bone: Secondary | ICD-10-CM

## 2016-11-16 LAB — BASIC METABOLIC PANEL
ANION GAP: 8 (ref 5–15)
BUN: 6 mg/dL (ref 6–20)
CO2: 29 mmol/L (ref 22–32)
Calcium: 9.4 mg/dL (ref 8.9–10.3)
Chloride: 94 mmol/L — ABNORMAL LOW (ref 101–111)
Creatinine, Ser: 0.71 mg/dL (ref 0.44–1.00)
GFR calc Af Amer: >60 mL/min (ref >60)
GFR calc non Af Amer: >60 mL/min (ref >60)
Glucose, Bld: 83 mg/dL (ref 65–99)
POTASSIUM: 3.6 mmol/L (ref 3.5–5.1)
SODIUM: 131 mmol/L — AB (ref 135–145)

## 2016-11-16 LAB — CBC WITH DIFFERENTIAL/PLATELET
BASOS ABS: 0 10*3/uL (ref 0.0–0.1)
Basophils Relative: 0 %
EOS ABS: 0.2 10*3/uL (ref 0.0–0.7)
EOS PCT: 2 %
HCT: 23.7 % — ABNORMAL LOW (ref 36.0–46.0)
Hemoglobin: 7.6 g/dL — ABNORMAL LOW (ref 12.0–15.0)
Lymphocytes Relative: 19 %
Lymphs Abs: 2.4 10*3/uL (ref 0.7–4.0)
MCH: 22.8 pg — ABNORMAL LOW (ref 26.0–34.0)
MCHC: 32.1 g/dL (ref 30.0–36.0)
MCV: 71 fL — ABNORMAL LOW (ref 78.0–100.0)
Monocytes Absolute: 1.6 10*3/uL — ABNORMAL HIGH (ref 0.1–1.0)
Monocytes Relative: 13 %
Neutro Abs: 8.5 10*3/uL — ABNORMAL HIGH (ref 1.7–7.7)
Neutrophils Relative %: 67 %
PLATELETS: 308 10*3/uL (ref 150–400)
RBC: 3.34 MIL/uL — AB (ref 3.87–5.11)
RDW: 22 % — AB (ref 11.5–15.5)
WBC: 12.7 10*3/uL — AB (ref 4.0–10.5)

## 2016-11-16 LAB — HEMOGLOBIN AND HEMATOCRIT, BLOOD
HCT: 24.4 % — ABNORMAL LOW (ref 36.0–46.0)
HEMOGLOBIN: 7.8 g/dL — AB (ref 12.0–15.0)

## 2016-11-16 MED ORDER — POLYETHYLENE GLYCOL 3350 17 G PO PACK: 17.0000 g | PACK | Freq: Two times a day (BID) | ORAL | 0 refills | Status: DC

## 2016-11-16 MED ORDER — CEFUROXIME AXETIL 500 MG PO TABS: 500.0000 mg | ORAL_TABLET | Freq: Two times a day (BID) | ORAL | 0 refills | Status: AC

## 2016-11-16 MED ORDER — HEPARIN SOD (PORK) LOCK FLUSH 100 UNIT/ML IV SOLN
500.0000 [IU] | Freq: Once | INTRAVENOUS | Status: AC
  Administered 2016-11-16: 500 [IU] via INTRAVENOUS
  Filled 2016-11-16: qty 5

## 2016-11-16 NOTE — Care Management Note (Signed)
Case Management Note  Patient Details  Name: Paula Navarro MRN: [id number] Date of Birth: 1950-08-23   Expected Discharge Date:  11/15/16               Expected Discharge Plan:  Home/Self Care  In-House Referral:  NA  Discharge planning Services  CM Consult  Post Acute Care Choice:  NA Choice offered to:  NA  Status of Service:  Completed, signed off  Additional Comments: Pt discharging home today with self care. Pt given list of transportation services in [facility].  Sherald Barge, RN 11/16/2016, 12:48 PM

## 2016-11-16 NOTE — Care Management Important Message (Signed)
Important Message  Patient Details  Name: Paula Navarro MRN: [id number] Date of Birth: [birth year]   Medicare Important Message Given:  Yes    Sherald Barge, RN 11/16/2016, 12:47 PM

## 2016-11-16 NOTE — Discharge Summary (Signed)
Physician Discharge Summary  Paula Navarro:[id number] DOB: Jun 21, 1951 DOA: 11/13/2016  PCP: Lanette Hampshire, MD (Inactive)  Admit date: 11/13/2016 Discharge date: 11/16/2016  Admitted From: Home  Disposition:  Home   Recommendations for Outpatient Follow-up:  1. Follow up with PCP in 1-2 weeks 2. Schedule follow up with Dr. Garwin Brothers (GYN)  3. Schedule a follow up with Dr. Lindi Adie 4. Please obtain BMP/CBC in one week   Home Health: No Equipment/Devices: None  Discharge Condition: Stable CODE STATUS: Full code Diet recommendation: Regular diet  Brief/Interim Summary: Paula Lonon Mooreis a 66 y.o.femalewith medical history significant of stage 4 breast cancer, vaginal bleeding for about a year with uterine polyp, chronic pain, copd, anxiety comes in with abdominal pain which is not controlled at home. Pt found to have a fever in the ED, she was unaware of this. She denies any n/v/d. She denies any urinary symptoms no dysuria (this was report by dr Kathrynn Humble in ED earlier however). Pt reports that she only takes one of her oxycodone pills a day because she gets constipated easily, she is taking mirilax daily but she is so scared to get "clogged up". She has had vaginal bleeding for 63month to a year for which she is suppose to see dr cousins for but she hasnt made an appointment yet. Pt denies any flank pain. She is tolerating eating well. She denies any recent hospitalizations or abx use. She was thought to maybe have a uti over a week ago and her urine cx at that time did not grow anything specific and she was not given antibiotics. Pt just wants "to know what is wrong with me". Pt was referred for admission for possible UTI and due to being "high risk" for MDR organisms.  Patient was started on Ceftriaxone and given IVF.  Unfortunately urine culture from day of admission had not been collected, however, [facility].  She did  improve on ceftriaxone and stated that she felt significantly better.  Her blood counts were monitored during her admission and she was found to be slightly slow (~8).  Per patient she has had vaginal bleeding for years that she has not had evaluated.  Discussed case with Dr. GLindi Adie patient's oncologist, who looked at CT scans and states he is concerned for metastatic disease.  He states he has told patient many times to get her vaginal bleeding evaluated.  Her H/H was stable and she was encouraged to follow up outpatient with GYN which she agreed to.  She was sent home on oral antibiotics.  Discharge Diagnoses:  Principal Problem:   UTI (urinary tract infection) Active Problems:   Bilateral breast cancer (HCC)   Anxiety   Chronic pain   Bone metastases (HCC)   Lung metastases (HCC)   Protein-calorie malnutrition, severe    Discharge Instructions   Allergies as of 11/16/2016      Reactions   Codeine Other (See Comments)   Hot in chest   Morphine And Related Itching, Rash      Medication List    TAKE these medications   calcium-vitamin D 500-400 MG-UNIT tablet Commonly known as:  OSCAL-500 Take 1 tablet by mouth 2 (two) times daily.   cefUROXime 500 MG tablet Commonly known as:  CEFTIN Take 1 tablet (500 mg total) by mouth 2 (two) times daily with a meal.   dicyclomine 10 MG capsule Commonly known as:  BENTYL Take 1 capsule (10 mg  total) by mouth 3 (three) times daily as needed for spasms.   gabapentin 300 MG capsule Commonly known as:  NEURONTIN TAKE 2 CAPSULES (600 MG TOTAL) BY MOUTH 3 (THREE) TIMES DAILY.   LAXATIVE 5 MG EC tablet Generic drug:  bisacodyl Take 5 mg by mouth 2 (two) times daily.   lidocaine-prilocaine cream Commonly known as:  EMLA Apply 1 application topically as needed.   LORazepam 1 MG tablet Commonly known as:  ATIVAN Take 1 tablet (1 mg total) by mouth at bedtime.   mineral oil enema Place 1 enema rectally once.   nystatin 100000  UNIT/ML suspension Commonly known as:  MYCOSTATIN Take 5 mLs (500,000 Units total) by mouth 4 (four) times daily.   ondansetron 4 MG tablet Commonly known as:  ZOFRAN Take 1 tablet (4 mg total) by mouth every 8 (eight) hours as needed for nausea or vomiting.   oxyCODONE 5 MG immediate release tablet Commonly known as:  Oxy IR/ROXICODONE Take 1 tablet (5 mg total) by mouth every 6 (six) hours as needed for severe pain.   polyethylene glycol packet Commonly known as:  MIRALAX / GLYCOLAX Take 17 g by mouth 2 (two) times daily.   prochlorperazine 10 MG tablet Commonly known as:  COMPAZINE TAKE 1 TABLET EVERY 6 HOURS AS NEEDED NAUEA OR VOMITING   SUPER B COMPLEX PO Take 1 tablet by mouth daily.   zolpidem 10 MG tablet Commonly known as:  AMBIEN Take 1 tablet (10 mg total) by mouth at bedtime.       Allergies  Allergen Reactions  . Codeine Other (See Comments)    Hot in chest  . Morphine And Related Itching and Rash    Consultations:  Phone conversation with Dr. Lindi Adie   Procedures/Studies: Ct Abdomen Pelvis W Contrast  Result Date: 11/14/2016 CLINICAL DATA:  Lower abdominal pain for 2 weeks. Worse tonight. Constipation, hematochezia, dizziness, and rectal bleeding. Rectal bleeding for 6-12 months. History of breast cancer and chronic pain. EXAM: CT ABDOMEN AND PELVIS WITH CONTRAST TECHNIQUE: Multidetector CT imaging of the abdomen and pelvis was performed using the standard protocol following bolus administration of intravenous contrast. CONTRAST:  178m ISOVUE-300 IOPAMIDOL (ISOVUE-300) INJECTION 61% COMPARISON:  01/26/2016 FINDINGS: Lower chest: Postoperative changes and scarring in the right breast. Small right pleural effusion. Bilateral basilar atelectasis. 3 mm nodule in the right middle lung anteriorly. This was not identified on the previous study. Developing pulmonary nodule should be excluded. Mild scarring in the right lung anteriorly is probably due to radiation  change. Hepatobiliary: Diffuse fatty infiltration of the liver. No focal lesions identified. Surgical absence of the gallbladder. Intra and extrahepatic bile duct dilatation has progressed somewhat since previous study but could be related to postoperative change. No obstructing mass or stone is identified. Pancreas: Mild diffuse pancreatic ductal dilatation without change. Pancreatic parenchymal appearance is unremarkable. No mass lesions identified. Spleen: Normal in size without focal abnormality. Adrenals/Urinary Tract: No adrenal gland nodules. Renal nephrograms are symmetrical. No hydronephrosis or hydroureter. Mild bladder wall thickening could indicate cystitis. No filling defects. Stomach/Bowel: Stomach and small bowel are normal for degree of distention. Colon is decompressed. No discrete colonic wall thickening or inflammatory reaction. Appendix is not identified. Vascular/Lymphatic: Aortic atherosclerosis with scattered calcification. No aortic aneurysm. Normal caliber IVC. Interval development of diffuse soft tissue in the retroperitoneum and retrocrural spaces likely representing developing lymphadenopathy. Appearance is suspicious for metastatic disease. Reproductive: Prominent heterogeneous nodular enlargement of the uterus consistent with fibroids. Some of the fibroids  are calcified and some demonstrate low attenuation consistent with necrosis. Largest measures about 7.8 x 8 cm in diameter. Size is increasing since previous study. This could be due to necrosis and hemorrhage within the fibroids. Sarcomatous degeneration is not excluded. Ovaries are not enlarged. Prominent pelvic varices. Other: No free air or free fluid in the abdomen. Abdominal wall musculature appears intact. Fat density in the right groin and likely represents lipoma. Musculoskeletal: No destructive bone lesions. IMPRESSION: 1. No definite evidence of bowel obstruction or inflammation although under distention limits evaluation  of the bowel wall. 2. Interval development of retroperitoneal and retrocrural lymphadenopathy suspicious for metastatic disease. 3. 3 mm nodule developing in the right middle lung anteriorly. Suggest six-month follow-up. 4. Small right pleural effusion. 5. Diffuse fatty infiltration of the liver. Increasing bile duct dilatation and pancreatic ductal dilatation. This may be physiologic after cholecystectomy but occult obstructing lesion is not excluded. Consider further evaluation with MRI/MRCP in follow-up. 6. Multiple large uterine fibroids, increasing in size since previous study. This may be due to interval necrosis of the lesions but leiomyosarcoma is not excluded. Electronically Signed   By: Lucienne Capers M.D.   On: 11/14/2016 01:42   Mr 3d Recon At Scanner  Result Date: 11/15/2016 CLINICAL DATA:  Biliary duct dilatation seen on CT scan. EXAM: MRI ABDOMEN WITHOUT AND WITH CONTRAST TECHNIQUE: Multiplanar multisequence MR imaging of the abdomen was performed both before and after the administration of intravenous contrast. CONTRAST:  26m MULTIHANCE GADOBENATE DIMEGLUMINE 529 MG/ML IV SOLN COMPARISON:  CT 11/14/2016 FINDINGS: Markedly motion degraded study. Lower chest: Unremarkable. Hepatobiliary: No suspicious enhancing lesion within the liver parenchyma. Gallbladder surgically absent. Intra and extrahepatic biliary duct dilatation again noted. Extrahepatic common duct measures 10 mm diameter. Common bile duct in the pancreatic head is 5 mm diameter. No evidence for choledocholithiasis. Pancreas: Mild prominence of the main pancreatic duct evident. Scattered homogeneous T2 hyperintense cystic foci noted in the pancreas. 8 mm lesion noted pancreatic tail image 1727) without definite communication to the main duct. 8 mm cyst near the body tail junction also without definite communication to the main duct. Spleen:  No splenomegaly. No focal mass lesion. Adrenals/Urinary Tract: No adrenal nodule or mass.  Tiny cortical cysts noted both kidneys. Stomach/Bowel: Stomach is nondistended. No gastric wall thickening. No evidence of outlet obstruction. Duodenum is normally positioned as is the ligament of Treitz. No small bowel or colonic dilatation within the visualized abdomen. Vascular/Lymphatic: No abdominal aortic aneurysm. Confluent periaortic lymphadenopathy is evident, as seen on recent CT scan. Some of the lymph nodes appear necrotic and there is displacement of the aorta and IVC secondary to mass-effect. Other:  No intraperitoneal free fluid. Musculoskeletal: No abnormal marrow enhancement within the visualized bony anatomy. IMPRESSION: 1. Mild intra and extrahepatic biliary duct dilatation without evidence for choledocholithiasis. 2. Mild distention of the main pancreatic duct diffusely. There is no discrete pancreatic head mass evident. Patient does have small simple appearing cystic lesions in the pancreatic parenchyma. Repeat MRI in 12 months recommended to confirm stability. This recommendation follows ACR consensus guidelines: Management of Incidental Pancreatic Cysts: A White Paper of the ACR Incidental Findings Committee. J Am Coll Radiol 27846;96:295-284 3. Confluent retroperitoneal lymphadenopathy in the upper abdomen. Metastatic disease and lymphoma would be primary considerations. Electronically Signed   By: EMisty StanleyM.D.   On: 11/15/2016 10:55   Dg Abd 2 Views  Result Date: 11/07/2016 CLINICAL DATA:  Constipation. EXAM: ABDOMEN - 2 VIEW COMPARISON:  None. FINDINGS:  The bowel gas pattern is normal. There is no evidence of free air. No radio-opaque calculi or other significant radiographic abnormality is seen. Moderate stool burden. Surgical clip RIGHT upper quadrant. Cardiomegaly. Osteopenia. IMPRESSION: Moderate stool burden.  No obstruction or free air. Electronically Signed   By: Staci Righter M.D.   On: 11/07/2016 14:30   Mr Abdomen Mrcp Moise Boring Contast  Result Date: 11/15/2016 CLINICAL  DATA:  Biliary duct dilatation seen on CT scan. EXAM: MRI ABDOMEN WITHOUT AND WITH CONTRAST TECHNIQUE: Multiplanar multisequence MR imaging of the abdomen was performed both before and after the administration of intravenous contrast. CONTRAST:  57m MULTIHANCE GADOBENATE DIMEGLUMINE 529 MG/ML IV SOLN COMPARISON:  CT 11/14/2016 FINDINGS: Markedly motion degraded study. Lower chest: Unremarkable. Hepatobiliary: No suspicious enhancing lesion within the liver parenchyma. Gallbladder surgically absent. Intra and extrahepatic biliary duct dilatation again noted. Extrahepatic common duct measures 10 mm diameter. Common bile duct in the pancreatic head is 5 mm diameter. No evidence for choledocholithiasis. Pancreas: Mild prominence of the main pancreatic duct evident. Scattered homogeneous T2 hyperintense cystic foci noted in the pancreas. 8 mm lesion noted pancreatic tail image 1727) without definite communication to the main duct. 8 mm cyst near the body tail junction also without definite communication to the main duct. Spleen:  No splenomegaly. No focal mass lesion. Adrenals/Urinary Tract: No adrenal nodule or mass. Tiny cortical cysts noted both kidneys. Stomach/Bowel: Stomach is nondistended. No gastric wall thickening. No evidence of outlet obstruction. Duodenum is normally positioned as is the ligament of Treitz. No small bowel or colonic dilatation within the visualized abdomen. Vascular/Lymphatic: No abdominal aortic aneurysm. Confluent periaortic lymphadenopathy is evident, as seen on recent CT scan. Some of the lymph nodes appear necrotic and there is displacement of the aorta and IVC secondary to mass-effect. Other:  No intraperitoneal free fluid. Musculoskeletal: No abnormal marrow enhancement within the visualized bony anatomy. IMPRESSION: 1. Mild intra and extrahepatic biliary duct dilatation without evidence for choledocholithiasis. 2. Mild distention of the main pancreatic duct diffusely. There is no  discrete pancreatic head mass evident. Patient does have small simple appearing cystic lesions in the pancreatic parenchyma. Repeat MRI in 12 months recommended to confirm stability. This recommendation follows ACR consensus guidelines: Management of Incidental Pancreatic Cysts: A White Paper of the ACR Incidental Findings Committee. J Am Coll Radiol 29528;41:[id number] 3. Confluent retroperitoneal lymphadenopathy in the upper abdomen. Metastatic disease and lymphoma would be primary considerations. Electronically Signed   By: EMisty StanleyM.D.   On: 11/15/2016 10:55       Subjective: Patient seen and examined.  She voices she feels better today than she has in a while.  Dr. GLindi Adiedid call patient.  She reports that she is not concerned about the lymph nodes in her abdomen.  She will follow up outpatient with Dr. GLindi Adieto discuss scheduling a biopsy.  She understands she needs to follow up with GYN for evaluation of her vaginal bleeding.  Discharge Exam: Vitals:   11/15/16 2200 11/16/16 0459  BP: 125/63 110/67  Pulse: 96 67  Resp: 20 18  Temp: 100.2 F (37.9 C) 99.1 F (37.3 C)   Vitals:   11/15/16 1428 11/15/16 2023 11/15/16 2200 11/16/16 0459  BP: 112/66  125/63 110/67  Pulse: 84  96 67  Resp: '18  20 18  '$ Temp: 98.7 F (37.1 C)  100.2 F (37.9 C) 99.1 F (37.3 C)  TempSrc: Oral  Oral Oral  SpO2: 98% 92% 94% 96%  Weight:  Height:        General: Pt is alert, awake, not in acute distress Cardiovascular: RRR, S1/S2 +, no rubs, no gallops, port in place Respiratory: CTA bilaterally, no wheezing, no rhonchi Abdominal: Soft, NT, ND, bowel sounds + Extremities: no edema, no cyanosis    The results of significant diagnostics from this hospitalization (including imaging, microbiology, ancillary and laboratory) are listed below for reference.     Microbiology: Recent Results (from the past 240 hour(s))  Urine Culture     Status: None   Collection Time: 11/07/16 11:16 AM   Result Value Ref Range Status   Urine Culture, Routine Final report  Final   Urine Culture result 1 No growth  Final  Culture, blood (Routine x 2)     Status: None (Preliminary result)   Collection Time: 11/13/16 10:47 PM  Result Value Ref Range Status   Specimen Description BLOOD RIGHT ARM  Final   Special Requests Blood Culture adequate volume  Final   Culture NO GROWTH 3 DAYS  Final   Report Status PENDING  Incomplete  Culture, blood (Routine x 2)     Status: None (Preliminary result)   Collection Time: 11/14/16 12:02 AM  Result Value Ref Range Status   Specimen Description BLOOD PORT DRAW PER MD  Final   Special Requests   Final    BOTTLES DRAWN AEROBIC AND ANAEROBIC Blood Culture results may not be optimal due to an inadequate volume of blood received in culture bottles   Culture NO GROWTH 2 DAYS  Final   Report Status PENDING  Incomplete     Labs: BNP (last 3 results) No results for input(s): BNP in the last 8760 hours. Basic Metabolic Panel:  Recent Labs Lab 11/13/16 2247 11/16/16 0447  NA 132* 131*  K 3.1* 3.6  CL 96* 94*  CO2 25 29  GLUCOSE 106* 83  BUN 7 6  CREATININE 0.72 0.71  CALCIUM 9.0 9.4   Liver Function Tests:  Recent Labs Lab 11/13/16 2247  AST 51*  ALT 18  ALKPHOS 60  BILITOT 0.8  PROT 7.4  ALBUMIN 2.9*    Recent Labs Lab 11/13/16 2247  LIPASE 16   No results for input(s): AMMONIA in the last 168 hours. CBC:  Recent Labs Lab 11/13/16 2247 11/16/16 0447 11/16/16 1118  WBC 13.6* 12.7*  --   NEUTROABS  --  8.5*  --   HGB 8.4* 7.6* 7.8*  HCT 26.5* 23.7* 24.4*  MCV 71.2* 71.0*  --   PLT 281 308  --    Cardiac Enzymes: No results for input(s): CKTOTAL, CKMB, CKMBINDEX, TROPONINI in the last 168 hours. BNP: Invalid input(s): POCBNP CBG: No results for input(s): GLUCAP in the last 168 hours. D-Dimer No results for input(s): DDIMER in the last 72 hours. Hgb A1c No results for input(s): HGBA1C in the last 72 hours. Lipid  Profile No results for input(s): CHOL, HDL, LDLCALC, TRIG, CHOLHDL, LDLDIRECT in the last 72 hours. Thyroid function studies No results for input(s): TSH, T4TOTAL, T3FREE, THYROIDAB in the last 72 hours.  Invalid input(s): FREET3 Anemia work up No results for input(s): VITAMINB12, FOLATE, FERRITIN, TIBC, IRON, RETICCTPCT in the last 72 hours. Urinalysis    Component Value Date/Time   COLORURINE YELLOW 11/14/2016 0020   APPEARANCEUR CLOUDY (A) 11/14/2016 0020   LABSPEC 1.005 11/14/2016 0020   LABSPEC 1.020 11/07/2016 1005   PHURINE 7.0 11/14/2016 0020   GLUCOSEU NEGATIVE 11/14/2016 0020   GLUCOSEU Negative 11/07/2016 1005  HGBUR LARGE (A) 11/14/2016 0020   BILIRUBINUR NEGATIVE 11/14/2016 0020   BILIRUBINUR Negative 11/07/2016 1005   KETONESUR NEGATIVE 11/14/2016 0020   PROTEINUR 30 (A) 11/14/2016 0020   UROBILINOGEN 0.2 11/07/2016 1005   NITRITE NEGATIVE 11/14/2016 0020   LEUKOCYTESUR LARGE (A) 11/14/2016 0020   LEUKOCYTESUR Color Interference 11/07/2016 1005   Sepsis Labs Invalid input(s): PROCALCITONIN,  WBC,  LACTICIDVEN Microbiology Recent Results (from the past 240 hour(s))  Urine Culture     Status: None   Collection Time: 11/07/16 11:16 AM  Result Value Ref Range Status   Urine Culture, Routine Final report  Final   Urine Culture result 1 No growth  Final  Culture, blood (Routine x 2)     Status: None (Preliminary result)   Collection Time: 11/13/16 10:47 PM  Result Value Ref Range Status   Specimen Description BLOOD RIGHT ARM  Final   Special Requests Blood Culture adequate volume  Final   Culture NO GROWTH 3 DAYS  Final   Report Status PENDING  Incomplete  Culture, blood (Routine x 2)     Status: None (Preliminary result)   Collection Time: 11/14/16 12:02 AM  Result Value Ref Range Status   Specimen Description BLOOD PORT DRAW PER MD  Final   Special Requests   Final    BOTTLES DRAWN AEROBIC AND ANAEROBIC Blood Culture results may not be optimal due to an  inadequate volume of blood received in culture bottles   Culture NO GROWTH 2 DAYS  Final   Report Status PENDING  Incomplete     Time coordinating discharge: 35 minutes SIGNED:   Loretha Stapler, MD  [facility] 11/16/2016, 1:35 PM Pager [phone] If 7PM-7AM, please contact night-coverage [email or web address] Password [email or web address]

## 2016-11-16 NOTE — Progress Notes (Signed)
Patient's right chest port heparin locked and deaccessed with no adverse events, patient voicing no complaints. Patient informed that the doctor is discharging her today. Case manger provided RN with transportation resources for patient if she needs help getting home or if she needs help getting to appointments. Case manager stated that for patient to use services today to get home then patient would have to call by 1430. Patient stated that her son will be picking her up today and that she does not need transportation services today.

## 2016-11-17 ENCOUNTER — Telehealth: Payer: Self-pay | Admitting: Hematology and Oncology

## 2016-11-17 ENCOUNTER — Telehealth: Payer: Self-pay | Admitting: *Deleted

## 2016-11-17 ENCOUNTER — Other Ambulatory Visit: Payer: Self-pay | Admitting: Emergency Medicine

## 2016-11-17 LAB — URINE CULTURE: Culture: NO GROWTH

## 2016-11-17 MED ORDER — TRAMADOL HCL 50 MG PO TABS: 50.0000 mg | ORAL_TABLET | Freq: Four times a day (QID) | ORAL | 1 refills | Status: DC | PRN

## 2016-11-17 NOTE — Telephone Encounter (Signed)
Called patient; no answer; unable to leave a message; refill for Tramadol called into the [facility] in [place].

## 2016-11-17 NOTE — Telephone Encounter (Signed)
sw pt to confirm 4/30 appt at 10 am per LOS

## 2016-11-17 NOTE — Telephone Encounter (Signed)
Call from pt requesting Ultram refill. "The hospitalist couldn't refill it." Pt reports she is no longer taking Oxycodone. Requests Tramadol to be called in to her pharmacy.  Message to Dr. Geralyn Flash pod.

## 2016-11-18 LAB — CULTURE, BLOOD (ROUTINE X 2)
Culture: NO GROWTH
Special Requests: ADEQUATE

## 2016-11-19 LAB — CULTURE, BLOOD (ROUTINE X 2): CULTURE: NO GROWTH

## 2016-11-20 ENCOUNTER — Emergency Department (HOSPITAL_COMMUNITY)
Admission: EM | Admit: 2016-11-20 | Discharge: 2016-11-21 | Disposition: A | Discharge status: Home / Self Care | Payer: Medicare Other | Attending: Emergency Medicine | Admitting: Emergency Medicine

## 2016-11-20 ENCOUNTER — Emergency Department (HOSPITAL_COMMUNITY): Payer: Medicare Other

## 2016-11-20 ENCOUNTER — Other Ambulatory Visit: Payer: Self-pay

## 2016-11-20 ENCOUNTER — Encounter (HOSPITAL_COMMUNITY): Payer: Self-pay

## 2016-11-20 DIAGNOSIS — C779 Secondary and unspecified malignant neoplasm of lymph node, unspecified: Secondary | ICD-10-CM | POA: Insufficient documentation

## 2016-11-20 DIAGNOSIS — N390 Urinary tract infection, site not specified: Secondary | ICD-10-CM

## 2016-11-20 DIAGNOSIS — Z87891 Personal history of nicotine dependence: Secondary | ICD-10-CM | POA: Insufficient documentation

## 2016-11-20 DIAGNOSIS — Z79899 Other long term (current) drug therapy: Secondary | ICD-10-CM | POA: Diagnosis not present

## 2016-11-20 DIAGNOSIS — C7800 Secondary malignant neoplasm of unspecified lung: Secondary | ICD-10-CM | POA: Diagnosis not present

## 2016-11-20 DIAGNOSIS — C50919 Malignant neoplasm of unspecified site of unspecified female breast: Secondary | ICD-10-CM | POA: Diagnosis not present

## 2016-11-20 DIAGNOSIS — J449 Chronic obstructive pulmonary disease, unspecified: Secondary | ICD-10-CM | POA: Insufficient documentation

## 2016-11-20 DIAGNOSIS — C7951 Secondary malignant neoplasm of bone: Secondary | ICD-10-CM | POA: Insufficient documentation

## 2016-11-20 DIAGNOSIS — R42 Dizziness and giddiness: Secondary | ICD-10-CM | POA: Diagnosis present

## 2016-11-20 LAB — CBC WITH DIFFERENTIAL/PLATELET
Basophils Absolute: 0 10*3/uL (ref 0.0–0.1)
Basophils Relative: 0 %
EOS ABS: 0.3 10*3/uL (ref 0.0–0.7)
Eosinophils Relative: 3 %
HEMATOCRIT: 24.8 % — AB (ref 36.0–46.0)
Hemoglobin: 7.8 g/dL — ABNORMAL LOW (ref 12.0–15.0)
Lymphocytes Relative: 18 %
Lymphs Abs: 2.2 10*3/uL (ref 0.7–4.0)
MCH: 22.4 pg — AB (ref 26.0–34.0)
MCHC: 31.5 g/dL (ref 30.0–36.0)
MCV: 71.3 fL — ABNORMAL LOW (ref 78.0–100.0)
MONO ABS: 1 10*3/uL (ref 0.1–1.0)
Monocytes Relative: 8 %
Neutro Abs: 8.7 10*3/uL — ABNORMAL HIGH (ref 1.7–7.7)
Neutrophils Relative %: 71 %
PLATELETS: 292 10*3/uL (ref 150–400)
RBC: 3.48 MIL/uL — ABNORMAL LOW (ref 3.87–5.11)
RDW: 21.6 % — AB (ref 11.5–15.5)
WBC: 12.3 10*3/uL — AB (ref 4.0–10.5)

## 2016-11-20 LAB — BASIC METABOLIC PANEL
ANION GAP: 6 (ref 5–15)
BUN: 10 mg/dL (ref 6–20)
CALCIUM: 9.5 mg/dL (ref 8.9–10.3)
CHLORIDE: 99 mmol/L — AB (ref 101–111)
CO2: 29 mmol/L (ref 22–32)
CREATININE: 0.69 mg/dL (ref 0.44–1.00)
GFR calc non Af Amer: >60 mL/min (ref >60)
Glucose, Bld: 91 mg/dL (ref 65–99)
Potassium: 3.4 mmol/L — ABNORMAL LOW (ref 3.5–5.1)
Sodium: 134 mmol/L — ABNORMAL LOW (ref 135–145)

## 2016-11-20 LAB — TROPONIN I: Troponin I: <0.03 ng/mL (ref <0.03)

## 2016-11-20 LAB — URINALYSIS, ROUTINE W REFLEX MICROSCOPIC
Bilirubin Urine: NEGATIVE
Glucose, UA: NEGATIVE mg/dL
Ketones, ur: NEGATIVE mg/dL
Nitrite: NEGATIVE
PROTEIN: 30 mg/dL — AB
SPECIFIC GRAVITY, URINE: 1.014 (ref 1.005–1.030)
pH: 5 (ref 5.0–8.0)

## 2016-11-20 MED ORDER — HEPARIN SOD (PORK) LOCK FLUSH 100 UNIT/ML IV SOLN
INTRAVENOUS | Status: AC
  Filled 2016-11-20: qty 5

## 2016-11-20 MED ORDER — POTASSIUM CHLORIDE CRYS ER 20 MEQ PO TBCR
40.0000 meq | EXTENDED_RELEASE_TABLET | Freq: Once | ORAL | Status: AC
  Administered 2016-11-20: 40 meq via ORAL
  Filled 2016-11-20: qty 2

## 2016-11-20 MED ORDER — CEPHALEXIN 500 MG PO CAPS: 500.0000 mg | ORAL_CAPSULE | Freq: Four times a day (QID) | ORAL | 0 refills | Status: DC

## 2016-11-20 MED ORDER — IOPAMIDOL (ISOVUE-370) INJECTION 76%
100.0000 mL | Freq: Once | INTRAVENOUS | Status: AC | PRN
  Administered 2016-11-20: 100 mL via INTRAVENOUS

## 2016-11-20 NOTE — ED Provider Notes (Signed)
[facility] DEPT Provider Note   CSN: [id number] Arrival date & time: 11/20/16  1830     History   Chief Complaint Chief Complaint  Patient presents with  . Dizziness    HPI Paula Navarro is a 66 y.o. female.  HPI  Pt was seen at [facility]. Per pt, c/o gradual onset and persistence of intermittent episodes of "dizziness" that began PTA. Pt states she took oxycodone '5mg'$  tablet approximately 1600 (she has been taking this for the past 5 days) and 1 hour later she began to feel "dizzy" when she moved from laying to standing position. Pt describes the dizziness as "lightheadedness" and near syncope. Symptoms improve when she lays back down again. Denies any other complaints. Denies visual changes, no focal motor weakness, no tingling/numbness in extremities, no ataxia, no slurred speech, no facial droop, no CP/palpitations, no SOB/cough, no abd pain, no N/V/D.    Past Medical History:  Diagnosis Date  . Anemia   . Anxiety   . Atrial septal defect 1996   Surgical repair in 1996  . Breast cancer ([place])   . Chest pain    Admitted to [facility] in 09/2011; refused stress test  . Chronic bronchitis   . Chronic pain   . COPD (chronic obstructive pulmonary disease) ([place])    on xray  . Palpitation    Tachycardia reported by monitor clerk during a symptomatic spell  . Radiation 06/30/14-08/17/14   Bilateral Breast  . Tobacco abuse    60 pack years; 1.5 packs per day  . Wears dentures    top    Patient Active Problem List   Diagnosis Date Noted  . Protein-calorie malnutrition, severe 11/15/2016  . UTI (urinary tract infection) 11/14/2016  . COPD (chronic obstructive pulmonary disease) ([place])   . 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 Chemotherapy-induced thrombocytopenia 06/13/2016  . Scalp lesion 04/01/2016  . Encounter for central line care 12/08/2015  . Port catheter in place 11/19/2015  . Insomnia 11/01/2015  . Microcytic anemia 09/28/2015  . Lung metastases  ([place]) 09/10/2015  . Bone metastases ([place]) 07/20/2015  . Encounter for chemotherapy management 07/13/2015  . Metastatic breast cancer ([place]) 06/29/2015  . Chronic pain 03/16/2015  . Vaginal bleeding 09/24/2014  . Postherpetic neuralgia 09/03/2014  . Lymphedema 09/03/2014  . Suspected herpes zoster left C5 distribution 08/14/2014  . Neuropathy due to chemotherapeutic drug ([place]) 03/03/2014  . Anxiety 03/03/2014  . Bilateral breast cancer ([place]) 08/11/2013  . Palpitation   . Chest pain   . Atrial septal defect   . Laboratory test 11/25/2011  . Chronic bronchitis   . Tobacco abuse     Past Surgical History:  Procedure Laterality Date  . ASD REPAIR, OSTIUM PRIMUM  1996   dr Roxy Horseman  . AXILLARY LYMPH NODE DISSECTION Bilateral 04/08/2014   Procedure:  BILATERAL AXILLARY LYMPH NODE DISSECTION;  Surgeon: Autumn Messing III, MD;  Location: [facility];  Service: General;  Laterality: Bilateral;  . BREAST BIOPSY Bilateral   . BREAST LUMPECTOMY WITH RADIOACTIVE SEED LOCALIZATION Bilateral 04/08/2014   Procedure: BILATERAL  RADIOACTIVE SEED LOCALIZATION LUMPECTOMY ;  Surgeon: Autumn Messing III, MD;  Location: [facility];  Service: General;  Laterality: Bilateral;  . CESAREAN SECTION     x3  . CHOLECYSTECTOMY    . open heart surgery    . PORT A CATH REVISION  1/15   put in   . TUBAL LIGATION      OB History  Gravida Para Term Preterm AB Living   '4 3 3         '$ SAB TAB Ectopic Multiple Live Births                   Home Medications    Prior to Admission medications   Medication Sig Start Date End Date Taking? Authorizing Provider  B Complex-C (SUPER B COMPLEX PO) Take 1 tablet by mouth daily.    Historical Provider, MD  bisacodyl (LAXATIVE) 5 MG EC tablet Take 5 mg by mouth 2 (two) times daily.    Historical Provider, MD  calcium-vitamin D (OSCAL-500) 500-400 MG-UNIT tablet Take 1 tablet by mouth 2 (two) times daily. 08/18/15   Nicholas Lose, MD  dicyclomine  (BENTYL) 10 MG capsule Take 1 capsule (10 mg total) by mouth 3 (three) times daily as needed for spasms. 11/07/16   Gardenia Phlegm, NP  gabapentin (NEURONTIN) 300 MG capsule TAKE 2 CAPSULES (600 MG TOTAL) BY MOUTH 3 (THREE) TIMES DAILY. 08/30/16   Nicholas Lose, MD  lidocaine-prilocaine (EMLA) cream Apply 1 application topically as needed. 10/12/16   Nicholas Lose, MD  LORazepam (ATIVAN) 1 MG tablet Take 1 tablet (1 mg total) by mouth at bedtime. 11/07/16   Nicholas Lose, MD  mineral oil enema Place 1 enema rectally once.    Historical Provider, MD  nystatin (MYCOSTATIN) 100000 UNIT/ML suspension Take 5 mLs (500,000 Units total) by mouth 4 (four) times daily. Patient not taking: Reported on 11/09/2016 08/03/16   Nicholas Lose, MD  ondansetron (ZOFRAN) 4 MG tablet Take 1 tablet (4 mg total) by mouth every 8 (eight) hours as needed for nausea or vomiting. 07/28/16   Nicholas Lose, MD  oxyCODONE (OXY IR/ROXICODONE) 5 MG immediate release tablet Take 1 tablet (5 mg total) by mouth every 6 (six) hours as needed for severe pain. 11/09/16   Hayden Pedro, PA-C  polyethylene glycol [facility] / Floria Raveling) packet Take 17 g by mouth 2 (two) times daily. 11/16/16   Eber Jones, MD  prochlorperazine (COMPAZINE) 10 MG tablet TAKE 1 TABLET EVERY 6 HOURS AS NEEDED NAUEA OR VOMITING Patient not taking: Reported on 11/09/2016 10/12/16   Nicholas Lose, MD  traMADol (ULTRAM) 50 MG tablet Take 1 tablet (50 mg total) by mouth every 6 (six) hours as needed. 11/17/16   Gardenia Phlegm, NP  zolpidem (AMBIEN) 10 MG tablet Take 1 tablet (10 mg total) by mouth at bedtime. 10/26/16   Nicholas Lose, MD    Family History Family History  Problem Relation Age of Onset  . Breast cancer Maternal Aunt     Social History Social History  Substance Use Topics  . Smoking status: Former Smoker    Packs/day: 1.50    Years: 40.00    Types: Cigarettes    Quit date: 08/01/2012  . Smokeless tobacco: Never Used  . Alcohol  use No     Allergies   Codeine and Morphine and related   Review of Systems Review of Systems ROS: Statement: All systems negative except as marked or noted in the HPI; Constitutional: Negative for fever and chills. ; ; Eyes: Negative for eye pain, redness and discharge. ; ; ENMT: Negative for ear pain, hoarseness, nasal congestion, sinus pressure and sore throat. ; ; Cardiovascular: Negative for chest pain, palpitations, diaphoresis, dyspnea and peripheral edema. ; ; Respiratory: Negative for cough, wheezing and stridor. ; ; Gastrointestinal: Negative for nausea, vomiting, diarrhea, abdominal pain, blood in stool, hematemesis, jaundice and rectal  bleeding. . ; ; Genitourinary: Negative for dysuria, flank pain and hematuria. ; ; Musculoskeletal: Negative for back pain and neck pain. Negative for swelling and trauma.; ; Skin: Negative for pruritus, rash, abrasions, blisters, bruising and skin lesion.; ; Neuro: +lightheadedness. Negative for headache, and neck stiffness. Negative for weakness, altered level of consciousness, altered mental status, extremity weakness, paresthesias, involuntary movement, seizure and syncope.       Physical Exam Updated Vital Signs BP 123/73 (BP Location: Right Arm)   Pulse 86   Temp 98.3 F (36.8 C) (Oral)   Resp 18   Ht '5\' 2"'$  (1.575 m)   Wt 123 lb (55.8 kg)   LMP 05/20/2011   SpO2 95%   BMI 22.50 kg/m     19:32:24 Orthostatic Vital Signs HC  Orthostatic Lying   BP- Lying: 128/66  Pulse- Lying: 81      Orthostatic Sitting  BP- Sitting: 129/72  Pulse- Sitting: 89      Orthostatic Standing at 0 minutes  BP- Standing at 0 minutes: 136/72  Pulse- Standing at 0 minutes: 95      Physical Exam 1840: Physical examination:  Nursing notes reviewed; Vital signs and O2 SAT reviewed;  Constitutional: Well developed, Well nourished, Well hydrated, In no acute distress; Head:  Normocephalic, atraumatic; Eyes: EOMI, PERRL, No scleral icterus; ENMT:  Mouth and pharynx normal, Mucous membranes moist; Neck: Supple, Full range of motion, No lymphadenopathy; Cardiovascular: Regular rate and rhythm, No gallop; Respiratory: Breath sounds clear & equal bilaterally, No wheezes.  Speaking full sentences with ease, Normal respiratory effort/excursion; Chest: Nontender, Movement normal; Abdomen: Soft, Nontender, Nondistended, Normal bowel sounds; Genitourinary: No CVA tenderness; Extremities: Pulses normal, No tenderness, No edema, No calf edema or asymmetry.; Neuro: AA&Ox3, Major CN grossly intact. Speech clear.  No facial droop.  No nystagmus. Grips equal. Strength 5/5 equal bilat UE's and LE's.  DTR 2/4 equal bilat UE's and LE's.  No gross sensory deficits.  Normal cerebellar testing bilat UE's (finger-nose) and LE's (heel-shin)..; Skin: Color normal, Warm, Dry.   ED Treatments / Results  Labs (all labs ordered are listed, but only abnormal results are displayed)   EKG  EKG Interpretation None       Radiology   Procedures Procedures (including critical care time)  Medications Ordered in ED Medications - No data to display   Initial Impression / Assessment and Plan / ED Course  I have reviewed the triage vital signs and the nursing notes.  Pertinent labs & imaging results that were available during my care of the patient were reviewed by me and considered in my medical decision making (see chart for details).  MDM Reviewed: previous chart, nursing note and vitals Reviewed previous: labs and ECG Interpretation: labs, ECG and CT scan   ED ECG REPORT   Date: 11/20/2016  Rate: 89  Rhythm: normal sinus rhythm  QRS Axis: normal  Intervals: normal  ST/T Wave abnormalities: normal  Conduction Disutrbances:none  Narrative Interpretation:   Old EKG Reviewed: unchanged; no significant changes from previous EKG dated 12/01/2014.  Results for orders placed or performed during the hospital encounter of 86/76/72  Basic metabolic panel    Result Value Ref Range   Sodium 134 (L) 135 - 145 mmol/L   Potassium 3.4 (L) 3.5 - 5.1 mmol/L   Chloride 99 (L) 101 - 111 mmol/L   CO2 29 22 - 32 mmol/L   Glucose, Bld 91 65 - 99 mg/dL   BUN 10 6 - 20 mg/dL  Creatinine, Ser 0.69 0.44 - 1.00 mg/dL   Calcium 9.5 8.9 - 10.3 mg/dL   GFR calc non Af Amer >60 >60 mL/min   GFR calc Af Amer >60 >60 mL/min   Anion gap 6 5 - 15  Troponin I  Result Value Ref Range   Troponin I <0.03 <0.03 ng/mL  CBC with Differential  Result Value Ref Range   WBC 12.3 (H) 4.0 - 10.5 K/uL   RBC 3.48 (L) 3.87 - 5.11 MIL/uL   Hemoglobin 7.8 (L) 12.0 - 15.0 g/dL   HCT 24.8 (L) 36.0 - 46.0 %   MCV 71.3 (L) 78.0 - 100.0 fL   MCH 22.4 (L) 26.0 - 34.0 pg   MCHC 31.5 30.0 - 36.0 g/dL   RDW 21.6 (H) 11.5 - 15.5 %   Platelets 292 150 - 400 K/uL   Neutrophils Relative % 71 %   Neutro Abs 8.7 (H) 1.7 - 7.7 K/uL   Lymphocytes Relative 18 %   Lymphs Abs 2.2 0.7 - 4.0 K/uL   Monocytes Relative 8 %   Monocytes Absolute 1.0 0.1 - 1.0 K/uL   Eosinophils Relative 3 %   Eosinophils Absolute 0.3 0.0 - 0.7 K/uL   Basophils Relative 0 %   Basophils Absolute 0.0 0.0 - 0.1 K/uL   RBC Morphology ELLIPTOCYTES   Urinalysis, Routine w reflex microscopic  Result Value Ref Range   Color, Urine AMBER (A) YELLOW   APPearance CLOUDY (A) CLEAR   Specific Gravity, Urine 1.014 1.005 - 1.030   pH 5.0 5.0 - 8.0   Glucose, UA NEGATIVE NEGATIVE mg/dL   Hgb urine dipstick LARGE (A) NEGATIVE   Bilirubin Urine NEGATIVE NEGATIVE   Ketones, ur NEGATIVE NEGATIVE mg/dL   Protein, ur 30 (A) NEGATIVE mg/dL   Nitrite NEGATIVE NEGATIVE   Leukocytes, UA LARGE (A) NEGATIVE   RBC / HPF TOO NUMEROUS TO COUNT 0 - 5 RBC/hpf   WBC, UA TOO NUMEROUS TO COUNT 0 - 5 WBC/hpf   Bacteria, UA RARE (A) NONE SEEN   Squamous Epithelial / LPF 0-5 (A) NONE SEEN   WBC Clumps PRESENT    Mucous PRESENT    *Note: Due to a large number of results and/or encounters for the requested time period, some results  have not been displayed. A complete set of results can be found in Results Review.   Ct Head Wo Contrast Result Date: 11/20/2016 CLINICAL DATA:  Dizziness after taking oxycodone for bilateral breast cancer pain. EXAM: CT HEAD WITHOUT CONTRAST TECHNIQUE: Contiguous axial images were obtained from the base of the skull through the vertex without intravenous contrast. COMPARISON:  04/06/2016 FINDINGS: BRAIN: Periventricular and subcortical white matter hypodensities consistent with chronic appearing small vessel ischemic disease. No acute large vascular territory infarcts. No abnormal extra-axial fluid collections. No intra-axial mass, edema or midline shift. Basal cisterns are not effaced and midline. VASCULAR: Mild to moderate calcific atherosclerosis of the carotid siphons. SKULL: No skull fracture. Interval increase in size of right high parietal predominantly solid and slightly heterogeneous scalp mass estimated at 3.6 x 2.9 cm versus 1.6 x 1 cm in 2017 (incompletely imaged in the craniocaudad dimension currently). SINUSES/ORBITS: The mastoid air-cells are clear. The included paranasal sinuses are well-aerated.The included ocular globes and orbital contents are non-suspicious. OTHER: None. IMPRESSION: 1. Interval increase in size of high right parietal scalp mass since 2017 currently estimated at 3.6 x 2.9 cm versus 1.6 x 1 cm in 2 orthogonal views. Given history of breast cancer, soft  tissue metastasis is of concern. 2. Chronic appearing small vessel ischemic disease of periventricular subcortical white matter. 3. No acute intracranial abnormality. Electronically Signed   By: Ashley Royalty M.D.   On: 11/20/2016 22:35   Ct Angio Chest Pe W/cm &/or Wo Cm Result Date: 11/20/2016 CLINICAL DATA:  Dizziness, near syncope. Leg pain. History of breast cancer, chronic bronchitis, COPD. EXAM: CT ANGIOGRAPHY CHEST WITH CONTRAST TECHNIQUE: Multidetector CT imaging of the chest was performed using the standard protocol  during bolus administration of intravenous contrast. Multiplanar CT image reconstructions and MIPs were obtained to evaluate the vascular anatomy. CONTRAST:  100 cc Omnipaque 370 COMPARISON:  Bone scan September 20, 2016 and CT chest June 27, 2016 FINDINGS: CARDIOVASCULAR: Adequate contrast opacification of the pulmonary artery's. Main pulmonary artery is not enlarged. No pulmonary arterial filling defects to the level of the subsegmental branches. Heart size is mildly enlarged, no right heart strain. No pericardial effusion. Thoracic aorta is normal course and caliber, trace calcific atherosclerosis. MEDIASTINUM/NODES: Worsening bulky conglomeration mediastinal and RIGHT hilar lymphadenopathy. Mild debris distended upper esophagus. LUNGS/PLEURA: Tracheobronchial tree is patent, no pneumothorax. New 4 mm sub solid LEFT upper lobe pulmonary nodule. Increase in size of RIGHT middle lobe 5 mm pulmonary nodule. Relatively unchanged additional subcentimeter pulmonary nodules. Moderate centrilobular emphysema. No pleural effusion. UPPER ABDOMEN: Included view of the abdomen is unremarkable. MUSCULOSKELETAL: Multilobulated LEFT breast mass was 3.7 x 5.8 cm, now 5.4 x 7.4 cm. Status post LEFT axillary nodal dissection. Status post median sternotomy. Biopsy clips RIGHT breast. Old LEFT rib fractures. Review of the MIP images confirms the above findings. IMPRESSION: No acute pulmonary embolus. Disease progression: Enlarging primary LEFT breast cancer, worsening mediastinal lymphadenopathy and increase in size and number of pulmonary metastasis. Electronically Signed   By: Elon Alas M.D.   On: 11/20/2016 22:37    2310:  Pt aware of scalp lesion (she states "it's a cyst I need to have removed). Also made aware of CA progression; states she is due to see her Onc MD to discuss further chemo ("that wasn't working") or XRT.  Pt ambulated with steady gait, easy resps, NAD, Sats remained 95-96% R/A; denied any  complaints. Pt not orthostatic on VS. Neuro exam remains intact and unchanged. Continues to deny CP/SOB. Workup reassuring. No clear indication for admission at this time. Will tx for possible UTI while UC pending. Dx and testing d/w pt.  Questions answered.  Verb understanding, agreeable to d/c home with outpt f/u.      Final Clinical Impressions(s) / ED Diagnoses   Final diagnoses:  None    New Prescriptions New Prescriptions   No medications on file     Francine Graven, DO 11/24/16 1525

## 2016-11-20 NOTE — Discharge Instructions (Signed)
Take the prescription as directed.  Your chest and head CT scans showed incidental findings: "Disease progression: Enlarging primary LEFT breast cancer, worsening mediastinal lymphadenopathy and increase in size and number of pulmonary metastasis;" and "Interval increase in size of high right parietal scalp mass since 2017 currently estimated at 3.6 x 2.9 cm versus 1.6 x 1 cm in 2 orthogonal views."  Call your regular Oncologist tomorrow morning to schedule a follow up appointment within the next 2 days. Return to the Emergency Department immediately sooner if worsening.

## 2016-11-20 NOTE — ED Notes (Signed)
O2 did not fall under 95 while ambulating

## 2016-11-20 NOTE — ED Triage Notes (Addendum)
Pt reports she has bilateral breast cancer and took oxycodone '5mg'$    around 4pm for pain.  Reports was laying in the bed approx an hour later and started feeling dizzy.  Pt alert and oriented.  Denies pain at this time but still c/o dizziness.   Reports usually takes oxycodone 5/325.  Dr. Thurnell Garbe at bedside.

## 2016-11-21 ENCOUNTER — Other Ambulatory Visit: Payer: Medicare Other

## 2016-11-21 ENCOUNTER — Ambulatory Visit: Payer: Medicare Other | Admitting: Gastroenterology

## 2016-11-21 ENCOUNTER — Ambulatory Visit: Payer: Medicare Other | Admitting: Hematology and Oncology

## 2016-11-21 ENCOUNTER — Encounter: Payer: Self-pay | Admitting: Gastroenterology

## 2016-11-21 ENCOUNTER — Encounter: Payer: Medicare Other | Admitting: Genetics

## 2016-11-22 LAB — URINE CULTURE: Culture: NO GROWTH

## 2016-11-24 ENCOUNTER — Encounter: Payer: Self-pay | Admitting: *Deleted

## 2016-11-24 NOTE — Progress Notes (Signed)
[place] Psychosocial Distress Screening Clinical Social Work  Clinical Social Work was referred by distress screening protocol.  The patient scored a 5 on the Psychosocial Distress Thermometer which indicates moderate distress. Clinical Social Worker reviewed chart and phoned pt to assess for distress and other psychosocial needs. CSW attempted to phone pt x2 and phone just rang. Pt has appointment here next week and CSW team will attempt to follow up next week when pt is onsite.   ON[facility]N DISTRESS SCREENING 11/09/2016  Screening Type Initial Screening  Distress experienced in past week (1-10) 5  Practical problem type   Emotional problem type Isolation/feeling alone  Spiritual/Religous concerns type Relating to God  Information Concerns Type Lack of info about treatment  Physical Problem type Sleep/insomnia;Getting around;Loss of appetitie;Tingling hands/feet;Swollen arms/legs  Physician notified of physical symptoms Yes  Referral to clinical social work Yes  Referral to dietition Yes     Clinical Social Worker follow up needed: Yes.    If yes, follow up plan: See above Loren Racer, LCSW, OSW-C Clinical Social Worker [place]  [facility] Phone: [phone] Fax: [phone]

## 2016-11-27 ENCOUNTER — Ambulatory Visit (HOSPITAL_BASED_OUTPATIENT_CLINIC_OR_DEPARTMENT_OTHER): Payer: Medicare Other | Admitting: Hematology and Oncology

## 2016-11-27 VITALS — BP 140/74 | HR 84 | Temp 98.4°F | Resp 18 | Ht 62.0 in | Wt 119.9 lb

## 2016-11-27 DIAGNOSIS — C7801 Secondary malignant neoplasm of right lung: Secondary | ICD-10-CM

## 2016-11-27 DIAGNOSIS — C50912 Malignant neoplasm of unspecified site of left female breast: Secondary | ICD-10-CM

## 2016-11-27 DIAGNOSIS — C50011 Malignant neoplasm of nipple and areola, right female breast: Secondary | ICD-10-CM | POA: Diagnosis not present

## 2016-11-27 DIAGNOSIS — C50911 Malignant neoplasm of unspecified site of right female breast: Secondary | ICD-10-CM

## 2016-11-27 DIAGNOSIS — Z170 Estrogen receptor positive status [ER+]: Secondary | ICD-10-CM

## 2016-11-27 DIAGNOSIS — C770 Secondary and unspecified malignant neoplasm of lymph nodes of head, face and neck: Secondary | ICD-10-CM | POA: Diagnosis not present

## 2016-11-27 DIAGNOSIS — Z7189 Other specified counseling: Secondary | ICD-10-CM

## 2016-11-27 DIAGNOSIS — C7951 Secondary malignant neoplasm of bone: Secondary | ICD-10-CM

## 2016-11-27 DIAGNOSIS — C50012 Malignant neoplasm of nipple and areola, left female breast: Secondary | ICD-10-CM

## 2016-11-27 DIAGNOSIS — C50919 Malignant neoplasm of unspecified site of unspecified female breast: Secondary | ICD-10-CM

## 2016-11-27 MED ORDER — ONDANSETRON HCL 8 MG PO TABS: 8.0000 mg | ORAL_TABLET | Freq: Two times a day (BID) | ORAL | 1 refills | Status: DC | PRN

## 2016-11-27 MED ORDER — LIDOCAINE-PRILOCAINE 2.5-2.5 % EX CREA: TOPICAL_CREAM | CUTANEOUS | 3 refills | Status: DC

## 2016-11-27 MED ORDER — ZOLPIDEM TARTRATE 10 MG PO TABS: 10.0000 mg | ORAL_TABLET | Freq: Every day | ORAL | 2 refills | Status: DC

## 2016-11-27 MED ORDER — PROCHLORPERAZINE MALEATE 10 MG PO TABS: 10.0000 mg | ORAL_TABLET | Freq: Four times a day (QID) | ORAL | 1 refills | Status: DC | PRN

## 2016-11-27 NOTE — Assessment & Plan Note (Signed)
Bilateral breast cancers are usually diagnosed 2015 treated with surgery, radiation, anastrozole Metastatic breast cancer diagnosed 05/28/2015 as subpectoral mass, ER 5%, PR 0%, HER-2 negative, failed Xeloda and carboplatin and gemcitabine  CT CAP 11/15/2015:RP and retrocrural lymphadenopathy, large uterine fibroids with necrosis;  MRI Abd: RP lymphadenopathy CT chest 11/20/16: Worsening bulky conglomeration of mediastinal and right hilar lymph nodes, lung nodules subcentimeter size slight increase, left breast mass was 3.7 x 5.8 cm now 5.4 x 7.4 cm  Recommendation: 1. [place] days 1 and 8 every 3 weeks to start in 1 week  Vaginal bleeding: Due to very large fibroids. I asked the patient to undergo a hysterectomy. 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 Marlou Starks is planning surgery. She will need radiation if it is proven metastatic disease.  Return to clinic in one week to start systemic chemotherapy with [facility]

## 2016-11-27 NOTE — Progress Notes (Signed)
Patient Care Team: [name], MD (Inactive) as PCP - General (Family Medicine) [name], MD (Cardiology) [name], MD as Consulting Physician (Gastroenterology)  DIAGNOSIS:  Encounter Diagnoses  Name Primary?  . 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 Bone metastasis ([place])   . 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 Metastatic breast cancer ([place])   . Bilateral malignant neoplasm of breast in female, estrogen receptor positive, unspecified site of breast ([place])     SUMMARY OF ONCOLOGIC HISTORY:   Bilateral breast cancer ([place])   07/23/2013 Mammogram    Bilateral breast masses. 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 N1 M0 stage IIB ER 100%, PR 0%, HER-2       06/17/2014 -  Radiation Therapy    Adjuvant radiation therapy      09/14/2014 -  Anti-estrogen oral therapy    Anastrozole 1 mg daily      05/28/2015  Imaging    CT scans: Enlarging subpectoral masses 3.1 x 3.5 cm, posteriorly lower density mass 4.5 x 2.1 cm, several right-sided lung nodules right lower lobe 1.4 cm, 3 other right lung nodules, 1.7 cm right iliac bone lesion      06/21/2015 Procedure    Left subpectoral mass biopsy: Invasive high-grade ductal carcinoma ER 5%, PR 0%, HER-2 negative ratio 1.29      09/01/2015 Imaging    Left chest wall mass increased in size 7.2 x 5.1 cm, multiple subcutaneous nodules, increase in the lung nodules both in number as well as in the size of existing nodules      09/10/2015 -  Chemotherapy    Carboplatin, gemcitabine days 1 and 8 q 3 weeks, carboplatin discontinued for neuropathy (treatment break from 01/20/2016 to 03/24/2016); Added Carboplatin back 04/14/16 (for progression)      01/26/2016 Imaging    Marked improvement in size of lung nodules with many of the nodules resolved index right lower lobe nodule 1.9 x 1.8 cm is now 0.7 x 0.6 cm, reduction in the size of left breast mass 7.2 cm down to 2.6 cm, enlargement in the hypodense mass in the uterus      04/06/2016 Imaging    CT chest: Interval progression of pre-existing lung nodules new lung metastases (74m, 119m 2.9 cm, 1.6 cm), interval progression of disease in  the left breast 4.2 cm (was 2.6 cm), additional nodules 2.3 cm and 3.2 cm; CT head: scalp mass 1.9 cm      06/27/2016 Imaging    Ct chest: Stable lesions in lt breast deep to Left pectoralis, cystic lesion loc ant in left breast inc compared to previous; decrease in lung nodules (71m to 3 mm; 12 mm to 7 mm, RML nodule 2.9 cm to 1.8 cm, RLL 18mto 7 mm)      11/14/2016 Imaging    CT CAP:RP and retrocrural lymphadenopathy, large uterine fibroids with necrosis; MRI Abd: RP lymphadenopathy CT chest 11/20/16: Worsening bulky conglomeration of mediastinal and right hilar lymph nodes, lung nodules subcentimeter size slight increase, left breast mass was 3.7 x 5.8 cm now 5.4 x 7.4 cm        CHIEF COMPLIANT: Patient is here to discuss new treatment plan because of progression  INTERVAL HISTORY: Paula Navarro a [age]ear old with above-mentioned history metastatic breast cancer who was in the emergency room and [facility] multiple complaints including abdominal pain. She had abdominal CT scans that revealed retroperitoneal lymphadenopathy along with large uterine fibroids with necrosis. MRI of the abdomen showed retroperitoneal lymphadenopathy. She also underwent a CT chest which showed worsening mediastinal and right hilar lymph nodes as well as the left chest wall mass which had increased in size. She was subsequently discharged home and is here today to discuss the treatment plan. She also has a large subcutaneous lesion on the scalp which appears to be a cutaneous metastases. Over the past several years she has had uterine bleeding and previously had recommended that she see her gynecologist for hysterectomy. She is planning to see her gynecologist very soon.  REVIEW OF SYSTEMS:   Constitutional: Denies fevers, chills or abnormal weight loss Eyes: Denies blurriness of vision Ears, nose, mouth, throat, and face: Denies mucositis or sore throat, on the scalp there is a large subcutaneous lesion measuring 4 cm in size. Respiratory: Denies cough, dyspnea or wheezes Cardiovascular: Denies palpitation, chest discomfort Gastrointestinal:  Denies nausea, heartburn or change in bowel habits Skin: Denies abnormal skin rashes Lymphatics: Denies new lymphadenopathy or easy bruising Neurological:Denies numbness, tingling or new weaknesses Behavioral/Psych: Mood is stable, no new changes  Extremities: No lower extremity edema Breast: Left chest wall mass is increased in size significantly. All other systems were reviewed with the patient and are negative.  I have reviewed the past medical history, past surgical history, social history and family  history with the patient and they are unchanged from previous note.  ALLERGIES:  is allergic to codeine and morphine and related.  MEDICATIONS:  Current Outpatient Prescriptions  Medication Sig Dispense Refill  . B Complex-C (SUPER B COMPLEX PO) Take 1 tablet by mouth daily.    . bisacodyl (LAXATIVE) 5 MG EC tablet Take 5 mg by mouth 2 (two) times daily.    . calcium-vitamin D (OSCAL-500) 500-400 MG-UNIT tablet Take 1 tablet by mouth 2 (two) times daily. 60 tablet 3  . cephALEXin (KEFLEX) 500 MG capsule Take 1 capsule (500 mg total) by mouth 4 (four) times daily. 40 capsule 0  . dicyclomine (BENTYL) 10 MG capsule Take 1 capsule (10 mg total) by mouth 3 (three) times daily as needed for spasms. 10 capsule 0  . gabapentin (NEURONTIN) 300 MG capsule TAKE 2 CAPSULES (600 MG TOTAL) BY MOUTH 3 (THREE) TIMES DAILY. 180 capsule 2  . lidocaine-prilocaine (EMLA) cream Apply 1 application  topically as needed. (Patient taking differently: Apply 1 application topically as needed (for port access). ) 30 g 6  . lidocaine-prilocaine (EMLA) cream Apply to affected area once 30 g 3  . LORazepam (ATIVAN) 1 MG tablet Take 1 tablet (1 mg total) by mouth at bedtime. 30 tablet 0  . mineral oil enema Place 1 enema rectally once.    . nystatin (MYCOSTATIN) 100000 UNIT/ML suspension Take 5 mLs (500,000 Units total) by mouth 4 (four) times daily. (Patient not taking: Reported on 11/09/2016) 60 mL 0  . ondansetron (ZOFRAN) 4 MG tablet Take 1 tablet (4 mg total) by mouth every 8 (eight) hours as needed for nausea or vomiting. 30 tablet 0  . ondansetron (ZOFRAN) 8 MG tablet Take 1 tablet (8 mg total) by mouth 2 (two) times daily as needed (Nausea or vomiting). 30 tablet 1  . oxyCODONE (OXY IR/ROXICODONE) 5 MG immediate release tablet Take 1 tablet (5 mg total) by mouth every 6 (six) hours as needed for severe pain. 30 tablet 0  . polyethylene glycol (MIRALAX / GLYCOLAX) packet Take 17 g by mouth 2 (two) times daily. 14 each 0   . prochlorperazine (COMPAZINE) 10 MG tablet TAKE 1 TABLET EVERY 6 HOURS AS NEEDED NAUEA OR VOMITING (Patient not taking: Reported on 11/09/2016) 30 tablet 1  . prochlorperazine (COMPAZINE) 10 MG tablet Take 1 tablet (10 mg total) by mouth every 6 (six) hours as needed (Nausea or vomiting). 30 tablet 1  . traMADol (ULTRAM) 50 MG tablet Take 1 tablet (50 mg total) by mouth every 6 (six) hours as needed. (Patient taking differently: Take 50 mg by mouth every 6 (six) hours as needed for moderate pain or severe pain. ) 30 tablet 1  . zolpidem (AMBIEN) 10 MG tablet Take 1 tablet (10 mg total) by mouth at bedtime. 30 tablet 2   No current facility-administered medications for this visit.     PHYSICAL EXAMINATION: ECOG PERFORMANCE STATUS: 1 - Symptomatic but completely ambulatory  Vitals:   11/27/16 1003  BP: 140/74  Pulse: 84  Resp: 18  Temp: 98.4 F (36.9 C)   Filed Weights   11/27/16 1003  Weight: 119 lb 14.4 oz (54.4 kg)    GENERAL:alert, no distress and comfortable, Large subcutaneous lesion on the scalp SKIN: skin color, texture, turgor are normal, no rashes or significant lesions EYES: normal, Conjunctiva are pink and non-injected, sclera clear OROPHARYNX:no exudate, no erythema and lips, buccal mucosa, and tongue normal  NECK: supple, thyroid normal size, non-tender, without nodularity LYMPH:  no palpable lymphadenopathy in the cervical, axillary or inguinal LUNGS: clear to auscultation and percussion with normal breathing effort HEART: regular rate & rhythm and no murmurs and no lower extremity edema ABDOMEN:abdomen soft, non-tender and normal bowel sounds MUSCULOSKELETAL:no cyanosis of digits and no clubbing  NEURO: alert & oriented x 3 with fluent speech, neuropathy in hands and feet EXTREMITIES: Left arm lymphedema which is chronic  LABORATORY DATA:  I have reviewed the data as listed   Chemistry      Component Value Date/Time   NA 134 (L) 11/20/2016 2100   NA 137  11/07/2016 1005   K 3.4 (L) 11/20/2016 2100   K 3.4 (L) 11/07/2016 1005   CL 99 (L) 11/20/2016 2100   CO2 29 11/20/2016 2100   CO2 26 11/07/2016 1005   BUN 10 11/20/2016 2100   BUN 6.5 (L) 11/07/2016 1005   CREATININE 0.69 11/20/2016 2100   CREATININE 0.8 11/07/2016 1005  Component Value Date/Time   CALCIUM 9.5 11/20/2016 2100   CALCIUM 10.0 11/07/2016 1005   ALKPHOS 60 11/13/2016 2247   ALKPHOS 76 11/07/2016 1005   AST 51 (H) 11/13/2016 2247   AST 46 (H) 11/07/2016 1005   ALT 18 11/13/2016 2247   ALT 19 11/07/2016 1005   BILITOT 0.8 11/13/2016 2247   BILITOT 0.48 11/07/2016 1005       Lab Results  Component Value Date   WBC 12.3 (H) 11/20/2016   HGB 7.8 (L) 11/20/2016   HCT 24.8 (L) 11/20/2016   MCV 71.3 (L) 11/20/2016   PLT 292 11/20/2016   NEUTROABS 8.7 (H) 11/20/2016    ASSESSMENT & PLAN:  Bilateral breast cancer ([place]) Bilateral breast cancers are usually diagnosed 2015 treated with surgery, radiation, anastrozole Metastatic breast cancer diagnosed 05/28/2015 as subpectoral mass, ER 5%, PR 0%, HER-2 negative, failed Xeloda and carboplatin and gemcitabine  CT CAP 11/15/2015:RP and retrocrural lymphadenopathy, large uterine fibroids with necrosis;  MRI Abd: RP lymphadenopathy CT chest 11/20/16: Worsening bulky conglomeration of mediastinal and right hilar lymph nodes, lung nodules subcentimeter size slight increase, left breast mass was 3.7 x 5.8 cm now 5.4 x 7.4 cm  Recommendation: 1. Halaven days 1 and 8 every 3 weeks to start in 1 week  Vaginal bleeding: Due to very large fibroids. I asked the patient to undergo a hysterectomy. 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 Marlou Starks is planning surgery. She will need radiation if it is proven metastatic disease. If surgery is required, then we will give her a break from [facility] chemotherapy.  Goals of care: I discussed the patient the goal of treatment is palliation. We cannot cure her cancer. Eventually this  cancer will take her life. Patient understands this.  Return to clinic this Friday to start systemic chemotherapy with Halaven   I spent 25 minutes talking to the patient of which more than half was spent in counseling and coordination of care.  Orders Placed This Encounter  Procedures  . CBC with Differential    Standing Status:   Standing    Number of Occurrences:   20    Standing Expiration Date:   11/28/2017  . Comprehensive metabolic panel    Standing Status:   Standing    Number of Occurrences:   20    Standing Expiration Date:   11/28/2017   The patient has a good understanding of the overall plan. she agrees with it. she will call with any problems that may develop before the next visit here.   Rulon Eisenmenger, MD 11/27/16

## 2016-11-27 NOTE — Progress Notes (Signed)
START ON PATHWAY REGIMEN - Breast     A cycle is every 21 days:     Eribulin mesylate   **Always confirm dose/schedule in your pharmacy ordering system**    Patient Characteristics: Metastatic Chemotherapy, HER2 Negative/Unknown/Equivocal, ER +, Third Line, Prior Anthracycline or Anthracycline Contraindicated and Prior Taxane Therapeutic Status: Distant Metastases BRCA Mutation Status: Absent ER Status: Positive (+) HER2 Status: Negative (-) Would you be surprised if this patient died  in the next year? I would be surprised if this patient died in the next year PR Status: Negative (-) Line of therapy: Third Line  Intent of Therapy: Non-Curative / Palliative Intent, Discussed with Patient

## 2016-11-28 ENCOUNTER — Other Ambulatory Visit: Payer: Self-pay | Admitting: Emergency Medicine

## 2016-11-28 DIAGNOSIS — R1030 Lower abdominal pain, unspecified: Secondary | ICD-10-CM

## 2016-11-28 MED ORDER — DICYCLOMINE HCL 10 MG PO CAPS: 10.0000 mg | ORAL_CAPSULE | Freq: Three times a day (TID) | ORAL | 0 refills | Status: DC | PRN

## 2016-12-01 ENCOUNTER — Other Ambulatory Visit (HOSPITAL_BASED_OUTPATIENT_CLINIC_OR_DEPARTMENT_OTHER): Payer: Medicare Other

## 2016-12-01 ENCOUNTER — Other Ambulatory Visit: Payer: Self-pay | Admitting: Hematology and Oncology

## 2016-12-01 ENCOUNTER — Ambulatory Visit (HOSPITAL_BASED_OUTPATIENT_CLINIC_OR_DEPARTMENT_OTHER): Payer: Medicare Other

## 2016-12-01 ENCOUNTER — Other Ambulatory Visit: Payer: Self-pay

## 2016-12-01 ENCOUNTER — Encounter: Payer: Self-pay | Admitting: *Deleted

## 2016-12-01 VITALS — BP 116/69 | HR 75 | Temp 98.8°F | Resp 18

## 2016-12-01 DIAGNOSIS — C50912 Malignant neoplasm of unspecified site of left female breast: Secondary | ICD-10-CM

## 2016-12-01 DIAGNOSIS — C50919 Malignant neoplasm of unspecified site of unspecified female breast: Secondary | ICD-10-CM

## 2016-12-01 DIAGNOSIS — C50011 Malignant neoplasm of nipple and areola, right female breast: Secondary | ICD-10-CM | POA: Diagnosis not present

## 2016-12-01 DIAGNOSIS — C50112 Malignant neoplasm of central portion of left female breast: Principal | ICD-10-CM

## 2016-12-01 DIAGNOSIS — C770 Secondary and unspecified malignant neoplasm of lymph nodes of head, face and neck: Secondary | ICD-10-CM

## 2016-12-01 DIAGNOSIS — C50911 Malignant neoplasm of unspecified site of right female breast: Secondary | ICD-10-CM

## 2016-12-01 DIAGNOSIS — Z170 Estrogen receptor positive status [ER+]: Secondary | ICD-10-CM

## 2016-12-01 DIAGNOSIS — C50111 Malignant neoplasm of central portion of right female breast: Secondary | ICD-10-CM

## 2016-12-01 DIAGNOSIS — C7951 Secondary malignant neoplasm of bone: Secondary | ICD-10-CM

## 2016-12-01 LAB — COMPREHENSIVE METABOLIC PANEL
ALK PHOS: 67 U/L (ref 40–150)
ALT: 19 U/L (ref 0–55)
AST: 61 U/L — AB (ref 5–34)
Albumin: 2.6 g/dL — ABNORMAL LOW (ref 3.5–5.0)
Anion Gap: 11 mEq/L (ref 3–11)
BUN: 10.2 mg/dL (ref 7.0–26.0)
CO2: 25 meq/L (ref 22–29)
Calcium: 9.7 mg/dL (ref 8.4–10.4)
Chloride: 99 mEq/L (ref 98–109)
Creatinine: 0.7 mg/dL (ref 0.6–1.1)
EGFR: >90 mL/min/{1.73_m2} (ref >90)
GLUCOSE: 122 mg/dL (ref 70–140)
Potassium: 3.8 mEq/L (ref 3.5–5.1)
SODIUM: 136 meq/L (ref 136–145)
Total Bilirubin: 0.5 mg/dL (ref 0.20–1.20)
Total Protein: 7.5 g/dL (ref 6.4–8.3)

## 2016-12-01 LAB — CBC WITH DIFFERENTIAL/PLATELET
BASO%: 0.6 % (ref 0.0–2.0)
BASOS ABS: 0.1 10*3/uL (ref 0.0–0.1)
EOS%: 1.5 % (ref 0.0–7.0)
Eosinophils Absolute: 0.2 10*3/uL (ref 0.0–0.5)
HCT: 26.5 % — ABNORMAL LOW (ref 34.8–46.6)
HGB: 8.2 g/dL — ABNORMAL LOW (ref 11.6–15.9)
LYMPH%: 12.6 % — AB (ref 14.0–49.7)
MCH: 21.4 pg — AB (ref 25.1–34.0)
MCHC: 31.1 g/dL — AB (ref 31.5–36.0)
MCV: 68.9 fL — ABNORMAL LOW (ref 79.5–101.0)
MONO#: 0.7 10*3/uL (ref 0.1–0.9)
MONO%: 6.3 % (ref 0.0–14.0)
NEUT#: 9.1 10*3/uL — ABNORMAL HIGH (ref 1.5–6.5)
NEUT%: 79 % — AB (ref 38.4–76.8)
Platelets: 290 10*3/uL (ref 145–400)
RBC: 3.84 10*6/uL (ref 3.70–5.45)
RDW: 22.4 % — ABNORMAL HIGH (ref 11.2–14.5)
WBC: 11.5 10*3/uL — ABNORMAL HIGH (ref 3.9–10.3)
lymph#: 1.5 10*3/uL (ref 0.9–3.3)

## 2016-12-01 MED ORDER — TRAMADOL HCL 50 MG PO TABS: 50.0000 mg | ORAL_TABLET | Freq: Four times a day (QID) | ORAL | 0 refills | Status: DC | PRN

## 2016-12-01 MED ORDER — SODIUM CHLORIDE 0.9% FLUSH
10.0000 mL | INTRAVENOUS | Status: DC | PRN
  Administered 2016-12-01: 10 mL via INTRAVENOUS
  Filled 2016-12-01: qty 10

## 2016-12-01 MED ORDER — HEPARIN SOD (PORK) LOCK FLUSH 100 UNIT/ML IV SOLN
500.0000 [IU] | Freq: Once | INTRAVENOUS | Status: AC | PRN
  Administered 2016-12-01: 500 [IU] via INTRAVENOUS
  Filled 2016-12-01: qty 5

## 2016-12-01 NOTE — Progress Notes (Signed)
Patient reported worsening of neuropathy in feet and hands over the past two days. Said it felt like she was "walking on balls". Reported increased pain in feet and weakness in hands as well. Dr. Lindi Adie on PAL, called Dr. Jana Hakim and his recommendation was to hold treatment until patient can see Dr. Lindi Adie to further discuss symptoms. Called May, desk RN, to schedule appointment with MD on Monday. Patient aware of date and time. Education provided on holding treatment. Patient voiced understanding.   Wylene Simmer, BSN, RN 12/01/2016 10:21 AM

## 2016-12-01 NOTE — Progress Notes (Signed)
[place] Work  Clinical Social Work has been following for support and attempted to see pt at treatment today, but this was cancelled. CSW phoned pt and checked in. Pt reports to be "doing terrible" both physically and emotionally. CSW provided supportive listening. CSW inquired about phone contacts and pt has home phone that does have voice mail. CSW updated contacts with correct information. Pt would like to see CSW when she returns Monday. Pt reports she has concerns about her cancer and grief concerns due to her mother's death. Pt was getting prescriptions and had to get off the phone. CSW to follow up on Monday.   Clinical Social Work interventions: Supportive listening.  Loren Racer, LCSW, OSW-C Clinical Social Worker [place]  [place] Phone: [phone] Fax: [phone]

## 2016-12-01 NOTE — Progress Notes (Signed)
Pt states that she needs a refill on her tramadol. Pt has been taking it pretty much on the clock due to her tumor pain. Pt is experiencing increased neuropathy over the last few days and is to start halaven today. Dr.Magrinat was notified by infusion RN and treatment on hold at this time. Pt will need to see Dr.Gudena early next week to discuss new symptoms. Pt does state that it is somewhat improving, but not sure why it was getting worse yesterday. Called in prescription for tramadol at [place]. Pt appreciative of call and has no further concerns at this time. Confirmed time/date of appt next week to see Dr.Gudena.

## 2016-12-04 ENCOUNTER — Encounter: Payer: Self-pay | Admitting: *Deleted

## 2016-12-04 ENCOUNTER — Ambulatory Visit (HOSPITAL_BASED_OUTPATIENT_CLINIC_OR_DEPARTMENT_OTHER): Payer: Medicare Other | Admitting: Hematology and Oncology

## 2016-12-04 ENCOUNTER — Other Ambulatory Visit: Payer: Self-pay | Admitting: Obstetrics and Gynecology

## 2016-12-04 VITALS — BP 126/74 | HR 96 | Temp 98.2°F | Ht 62.0 in | Wt 117.5 lb

## 2016-12-04 DIAGNOSIS — C7951 Secondary malignant neoplasm of bone: Secondary | ICD-10-CM | POA: Diagnosis not present

## 2016-12-04 DIAGNOSIS — C7801 Secondary malignant neoplasm of right lung: Secondary | ICD-10-CM | POA: Diagnosis not present

## 2016-12-04 DIAGNOSIS — C50011 Malignant neoplasm of nipple and areola, right female breast: Secondary | ICD-10-CM

## 2016-12-04 DIAGNOSIS — C50012 Malignant neoplasm of nipple and areola, left female breast: Secondary | ICD-10-CM

## 2016-12-04 NOTE — Assessment & Plan Note (Signed)
Bilateral breast cancers are usually diagnosed 2015 treated with surgery, radiation, anastrozole Metastatic breast cancer diagnosed 05/28/2015 as subpectoral mass, ER 5%, PR 0%, HER-2 negative, failed Xeloda and carboplatin and gemcitabine  CT CAP 11/15/2015:RP and retrocrural lymphadenopathy, large uterine fibroids with necrosis;  MRI Abd: RP lymphadenopathy CT chest 11/20/16: Worsening bulky conglomeration of mediastinal and right hilar lymph nodes, lung nodules subcentimeter size slight increase, left breast mass was 3.7 x 5.8 cm now 5.4 x 7.4 cm  Current treatment: 1. Halaven days 1 and 8 every 3 weeks 12/04/2016 We will have to monitor closely for toxicities especially neuropathy.  Vaginal bleeding: Due to very large fibroids. I asked the patient to undergo a hysterectomy. 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 Toth is planning surgery. She will need radiation if it is proven metastatic disease. Return to clinic in one week for toxicity check and follow-up 

## 2016-12-04 NOTE — Progress Notes (Signed)
[place] Work  Holiday representative met with patient in exam room at [facility] to offer support and assess for needs.  Patient stated she was doing "ok", but expressed some concerns for transportation.  Patient is currently driving her self to appointments, but it is becoming increasingly more difficult.  Patient has Medicaid which provides transportation resources.  CSW and patient discussed RCATs.  CSW provided patient with contact information for RCATS and patient plans to make contact.  Patient also expressed concern for weight loss.  CSW shared information on the [place] and will send referral.  CSW also shared information on the support team and support services available.  Patient expressed interests in additional support and was open to [place] following up during treatment.  CSW provided contact informaiton and encouraged patient to call with questions or concerns.     Johnnye Lana, MSW, LCSW, OSW-C Clinical Social Worker [facility] [phone]

## 2016-12-04 NOTE — Progress Notes (Signed)
Patient Care Team: [name], MD (Inactive) as PCP - General (Family Medicine) Rothbart, Cristopher Estimable, MD (Cardiology) [name], MD as Consulting Physician (Gastroenterology)  DIAGNOSIS:  Encounter Diagnoses  Name Primary?  . Malignant neoplasm metastatic to right lung ([place]) Yes  . Bone metastases ([place])   . 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 N1 M0 stage IIB ER 100%, PR 0%, HER-2       06/17/2014 -  Radiation Therapy    Adjuvant radiation therapy      09/14/2014 -  Anti-estrogen oral therapy    Anastrozole 1 mg daily      05/28/2015 Imaging    CT scans: Enlarging subpectoral masses 3.1 x 3.5 cm, posteriorly lower density mass 4.5 x 2.1 cm, several right-sided lung nodules  right lower lobe 1.4 cm, 3 other right lung nodules, 1.7 cm right iliac bone lesion      06/21/2015 Procedure    Left subpectoral mass biopsy: Invasive high-grade ductal carcinoma ER 5%, PR 0%, HER-2 negative ratio 1.29      09/01/2015 Imaging    Left chest wall mass increased in size 7.2 x 5.1 cm, multiple subcutaneous nodules, increase in the lung nodules both in number as well as in the size of existing nodules      09/10/2015 -  Chemotherapy    Carboplatin, gemcitabine days 1 and 8 q 3 weeks, carboplatin discontinued for neuropathy (treatment break from 01/20/2016 to 03/24/2016); Added Carboplatin back 04/14/16 (for progression)      01/26/2016 Imaging    Marked improvement in size of lung nodules with many of the nodules resolved index right lower lobe nodule 1.9 x 1.8 cm is now 0.7 x 0.6 cm, reduction in the size of left breast mass 7.2 cm down to 2.6 cm, enlargement in the hypodense mass in the uterus      04/06/2016 Imaging    CT chest: Interval progression of pre-existing lung nodules new lung metastases (1m, 160m 2.9 cm, 1.6 cm), interval progression of disease in the left breast 4.2 cm (was 2.6 cm), additional nodules 2.3 cm and 3.2 cm; CT head: scalp mass 1.9 cm  06/27/2016 Imaging    Ct chest: Stable lesions in lt breast deep to Left pectoralis, cystic lesion loc ant in left breast inc compared to previous; decrease in lung nodules (81m to 3 mm; 12 mm to 7 mm, RML nodule 2.9 cm to 1.8 cm, RLL 132mto 7 mm)      11/14/2016 Imaging    CT CAP:RP and retrocrural lymphadenopathy, large uterine fibroids with necrosis; MRI Abd: RP lymphadenopathy CT chest 11/20/16: Worsening bulky conglomeration of mediastinal and right hilar lymph nodes, lung nodules subcentimeter size slight increase, left breast mass was 3.7 x 5.8 cm now 5.4 x 7.4 cm       CHIEF COMPLIANT: Follow-up of metastatic disease  INTERVAL HISTORY: Paula FRYMIREs a [age]ear old with above-mentioned history  metastatic breast cancer triple negative disease who was supposed to start palliative chemotherapy with Halaven last week but treatment was not given because of concern for neuropathy. She is here today to discuss the treatment plan for the future. Her neuropathy is in the fingers and toes. The week prior to the chemotherapy she had pain in her hands and feet which later subsided. She complains of also generalized weakness. 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  I have reviewed the past medical history, past surgical history, social history and family history with the patient and they are unchanged from previous note.  ALLERGIES:  is allergic to codeine and morphine and related.  MEDICATIONS:  Current Outpatient Prescriptions  Medication Sig Dispense Refill  . B Complex-C (SUPER B COMPLEX PO) Take 1 tablet by mouth daily.    . bisacodyl (LAXATIVE) 5 MG EC tablet Take 5 mg by mouth 2 (two) times daily.    . calcium-vitamin D (OSCAL-500) 500-400 MG-UNIT tablet Take 1 tablet by mouth 2 (two) times daily. 60 tablet 3  . cephALEXin (KEFLEX) 500 MG capsule Take 1 capsule (500 mg total) by mouth 4 (four) times daily. 40 capsule 0  . dicyclomine (BENTYL) 10 MG capsule Take 1 capsule (10 mg total) by mouth 3 (three) times daily as needed for spasms. 30  capsule 0  . gabapentin (NEURONTIN) 300 MG capsule TAKE 2 CAPSULES (600 MG TOTAL) BY MOUTH 3 (THREE) TIMES DAILY. 180 capsule 2  . lidocaine-prilocaine (EMLA) cream Apply 1 application topically as needed. (Patient taking differently: Apply 1 application topically as needed (for port access). ) 30 g 6  . lidocaine-prilocaine (EMLA) cream Apply to affected area once 30 g 3  . LORazepam (ATIVAN) 1 MG tablet Take 1 tablet (1 mg total) by mouth at bedtime. 30 tablet 0  . mineral oil enema Place 1 enema rectally once.    . nystatin (MYCOSTATIN) 100000 UNIT/ML suspension Take 5 mLs (500,000 Units total) by mouth 4 (four) times daily. (Patient not taking: Reported on 11/09/2016) 60 mL 0  . ondansetron (ZOFRAN) 4 MG tablet Take 1 tablet (4 mg total) by mouth every 8 (eight) hours as needed for  nausea or vomiting. 30 tablet 0  . ondansetron (ZOFRAN) 8 MG tablet Take 1 tablet (8 mg total) by mouth 2 (two) times daily as needed (Nausea or vomiting). 30 tablet 1  . oxyCODONE (OXY IR/ROXICODONE) 5 MG immediate release tablet Take 1 tablet (5 mg total) by mouth every 6 (six) hours as needed for severe pain. 30 tablet 0  . polyethylene glycol (MIRALAX / GLYCOLAX) packet Take 17 g by mouth 2 (two) times daily. 14 each 0  . prochlorperazine (COMPAZINE) 10 MG tablet TAKE 1 TABLET EVERY 6 HOURS AS NEEDED NAUEA OR VOMITING (Patient not taking: Reported on 11/09/2016) 30 tablet 1  . prochlorperazine (COMPAZINE) 10 MG tablet Take 1 tablet (10 mg total) by mouth every 6 (six) hours as needed (Nausea or vomiting). 30 tablet 1  . traMADol (ULTRAM) 50 MG tablet Take 1 tablet (50 mg total) by mouth every 6 (six) hours as needed for moderate pain or severe pain. 60 tablet 0  . zolpidem (AMBIEN) 10 MG tablet Take 1 tablet (10 mg total) by mouth at bedtime. 30 tablet 2   No current facility-administered medications for this visit.     PHYSICAL EXAMINATION: ECOG PERFORMANCE STATUS: 1 - Symptomatic but completely  ambulatory  Vitals:   12/04/16 1458  BP: 126/74  Pulse: 96  Temp: 98.2 F (36.8 C)   Filed Weights   12/04/16 1458  Weight: 117 lb 8 oz (53.3 kg)    GENERAL:alert, no distress and comfortable SKIN: skin color, texture, turgor are normal, no rashes or significant lesions EYES: normal, Conjunctiva are pink and non-injected, sclera clear OROPHARYNX:no exudate, no erythema and lips, buccal mucosa, and tongue normal  NECK: supple, thyroid normal size, non-tender, without nodularity LYMPH:  no palpable lymphadenopathy in the cervical, axillary or inguinal LUNGS: clear to auscultation and percussion with normal breathing effort HEART: regular rate & rhythm and no murmurs and no lower extremity edema ABDOMEN:abdomen soft, non-tender and normal bowel sounds MUSCULOSKELETAL:no cyanosis of digits and no clubbing  NEURO: alert & oriented x 3 with fluent speech, peripheral neuropathy in hands and feet EXTREMITIES: Left upper extremity lymphedema  LABORATORY DATA:  I have reviewed the data as listed   Chemistry      Component Value Date/Time   NA 136 12/01/2016 0838   K 3.8 12/01/2016 0838   CL 99 (L) 11/20/2016 2100   CO2 25 12/01/2016 0838   BUN 10.2 12/01/2016 0838   CREATININE 0.7 12/01/2016 0838      Component Value Date/Time   CALCIUM 9.7 12/01/2016 0838   ALKPHOS 67 12/01/2016 0838   AST 61 (H) 12/01/2016 0838   ALT 19 12/01/2016 0838   BILITOT 0.50 12/01/2016 0838       Lab Results  Component Value Date   WBC 11.5 (H) 12/01/2016   HGB 8.2 (L) 12/01/2016   HCT 26.5 (L) 12/01/2016   MCV 68.9 (L) 12/01/2016   PLT 290 12/01/2016   NEUTROABS 9.1 (H) 12/01/2016    ASSESSMENT & PLAN:  Bilateral breast cancer ([facility]) Bilateral breast cancers are usually diagnosed 2015 treated with surgery, radiation, anastrozole Metastatic breast cancer diagnosed 05/28/2015 as subpectoral mass, ER 5%, PR 0%, HER-2 negative, failed Xeloda and carboplatin and gemcitabine  CT CAP  11/15/2015:RP and retrocrural lymphadenopathy, large uterine fibroids with necrosis;  MRI Abd: RP lymphadenopathy CT chest 11/20/16: Worsening bulky conglomeration of mediastinal and right hilar lymph nodes, lung nodules subcentimeter size slight increase, left breast mass was 3.7 x 5.8 cm now 5.4 x  7.4 cm  Current treatment: 1. Halaven days 1 and 8 every 3 weeks 12/08/2016 We will have to monitor closely for toxicities especially neuropathy.  Vaginal bleeding: Due to very large fibroids. I asked the patient to undergo a hysterectomy. I will call Dr. cousins to discuss the plan. If she plans to do surgery then I would hold off on chemotherapy.  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 Marlou Starks is planning surgery. She will need radiation if it is proven metastatic disease.  Patient has peripheral neuropathy in hands and feet: Because of this her last week treatment was held. Patient and I fully understands the risks of worsening neuropathy. We do not have a whole lot of treatment options. Because of this we will proceed with Halaven and monitor her side effects. Neuropathy gets worse and we will discontinue it. Or social workers met with her and arrange for transportation through [place].  Return to clinic this Friday to start chemotherapy.  I spent 25 minutes talking to the patient of which more than half was spent in counseling and coordination of care.  No orders of the defined types were placed in this encounter.  The patient has a good understanding of the overall plan. she agrees with it. she will call with any problems that may develop before the next visit here.   Rulon Eisenmenger, MD 12/04/16

## 2016-12-05 ENCOUNTER — Other Ambulatory Visit: Payer: Self-pay | Admitting: Obstetrics and Gynecology

## 2016-12-07 ENCOUNTER — Other Ambulatory Visit: Payer: Self-pay | Admitting: *Deleted

## 2016-12-07 NOTE — Assessment & Plan Note (Signed)
Bilateral breast cancers are usually diagnosed 2015 treated with surgery, radiation, anastrozole Metastatic breast cancer diagnosed 05/28/2015 as subpectoral mass, ER 5%, PR 0%, HER-2 negative, failed Xeloda and carboplatin and gemcitabine  CT CAP 11/15/2015:RP and retrocrural lymphadenopathy, large uterine fibroids with necrosis;  MRI Abd: RP lymphadenopathy CT chest 11/20/16: Worsening bulky conglomeration of mediastinal and right hilar lymph nodes, lung nodules subcentimeter size slight increase, left breast mass was 3.7 x 5.8 cm now 5.4 x 7.4 cm  Current treatment: 1. Halaven days 1 and 8 every 3 weeks 12/08/2016 We will have to monitor closely for toxicities especially neuropathy.  Vaginal bleeding: Due to very large fibroids. Patient saw Dr. cousins yesterday. We had long discussion after her visit. She underwent endometrial sampling and biopsy as well as a Pap smear. This may give [place] an idea there is a second primary tumor versus metastatic disease from the breast.  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 Marlou Starks is planning surgery. She will need radiation if it is proven metastatic disease. Return to clinic in one week for toxicity check and follow-up

## 2016-12-08 ENCOUNTER — Other Ambulatory Visit (HOSPITAL_BASED_OUTPATIENT_CLINIC_OR_DEPARTMENT_OTHER): Payer: Medicare Other

## 2016-12-08 ENCOUNTER — Other Ambulatory Visit: Payer: Self-pay | Admitting: Hematology and Oncology

## 2016-12-08 ENCOUNTER — Ambulatory Visit (HOSPITAL_BASED_OUTPATIENT_CLINIC_OR_DEPARTMENT_OTHER): Payer: Medicare Other

## 2016-12-08 ENCOUNTER — Ambulatory Visit (HOSPITAL_BASED_OUTPATIENT_CLINIC_OR_DEPARTMENT_OTHER): Payer: Medicare Other | Admitting: Hematology and Oncology

## 2016-12-08 ENCOUNTER — Ambulatory Visit: Payer: Medicare Other

## 2016-12-08 ENCOUNTER — Ambulatory Visit: Payer: Medicare Other | Admitting: Nutrition

## 2016-12-08 DIAGNOSIS — C50012 Malignant neoplasm of nipple and areola, left female breast: Secondary | ICD-10-CM

## 2016-12-08 DIAGNOSIS — L989 Disorder of the skin and subcutaneous tissue, unspecified: Secondary | ICD-10-CM | POA: Diagnosis not present

## 2016-12-08 DIAGNOSIS — C50011 Malignant neoplasm of nipple and areola, right female breast: Secondary | ICD-10-CM

## 2016-12-08 DIAGNOSIS — C770 Secondary and unspecified malignant neoplasm of lymph nodes of head, face and neck: Secondary | ICD-10-CM

## 2016-12-08 DIAGNOSIS — C7951 Secondary malignant neoplasm of bone: Secondary | ICD-10-CM | POA: Diagnosis not present

## 2016-12-08 DIAGNOSIS — C50911 Malignant neoplasm of unspecified site of right female breast: Secondary | ICD-10-CM

## 2016-12-08 DIAGNOSIS — C50912 Malignant neoplasm of unspecified site of left female breast: Secondary | ICD-10-CM

## 2016-12-08 DIAGNOSIS — Z170 Estrogen receptor positive status [ER+]: Secondary | ICD-10-CM

## 2016-12-08 DIAGNOSIS — C50919 Malignant neoplasm of unspecified site of unspecified female breast: Secondary | ICD-10-CM

## 2016-12-08 LAB — CBC WITH DIFFERENTIAL/PLATELET
BASO%: 0.2 % (ref 0.0–2.0)
Basophils Absolute: 0 10*3/uL (ref 0.0–0.1)
EOS ABS: 0.2 10*3/uL (ref 0.0–0.5)
EOS%: 1.8 % (ref 0.0–7.0)
HCT: 27.2 % — ABNORMAL LOW (ref 34.8–46.6)
HEMOGLOBIN: 8.3 g/dL — AB (ref 11.6–15.9)
LYMPH%: 15.6 % (ref 14.0–49.7)
MCH: 21.2 pg — ABNORMAL LOW (ref 25.1–34.0)
MCHC: 30.5 g/dL — ABNORMAL LOW (ref 31.5–36.0)
MCV: 69.4 fL — ABNORMAL LOW (ref 79.5–101.0)
MONO#: 0.7 10*3/uL (ref 0.1–0.9)
MONO%: 6.8 % (ref 0.0–14.0)
NEUT%: 75.6 % (ref 38.4–76.8)
NEUTROS ABS: 8.2 10*3/uL — AB (ref 1.5–6.5)
Platelets: 255 10*3/uL (ref 145–400)
RBC: 3.92 10*6/uL (ref 3.70–5.45)
RDW: 20.8 % — AB (ref 11.2–14.5)
WBC: 10.8 10*3/uL — AB (ref 3.9–10.3)
lymph#: 1.7 10*3/uL (ref 0.9–3.3)

## 2016-12-08 LAB — COMPREHENSIVE METABOLIC PANEL
ALBUMIN: 2.7 g/dL — AB (ref 3.5–5.0)
ALK PHOS: 66 U/L (ref 40–150)
ALT: 19 U/L (ref 0–55)
ANION GAP: 10 meq/L (ref 3–11)
AST: 62 U/L — AB (ref 5–34)
BUN: 12.7 mg/dL (ref 7.0–26.0)
CALCIUM: 9.7 mg/dL (ref 8.4–10.4)
CO2: 25 mEq/L (ref 22–29)
CREATININE: 0.8 mg/dL (ref 0.6–1.1)
Chloride: 96 mEq/L — ABNORMAL LOW (ref 98–109)
EGFR: 88 mL/min/{1.73_m2} — ABNORMAL LOW (ref >90)
Glucose: 185 mg/dl — ABNORMAL HIGH (ref 70–140)
Potassium: 3.8 mEq/L (ref 3.5–5.1)
SODIUM: 131 meq/L — AB (ref 136–145)
TOTAL PROTEIN: 8 g/dL (ref 6.4–8.3)
Total Bilirubin: 0.62 mg/dL (ref 0.20–1.20)

## 2016-12-08 LAB — IRON AND TIBC
%SAT: 8 % — ABNORMAL LOW (ref 21–57)
Iron: 22 ug/dL — ABNORMAL LOW (ref 41–142)
TIBC: 256 ug/dL (ref 236–444)
UIBC: 235 ug/dL (ref 120–384)

## 2016-12-08 LAB — FERRITIN: FERRITIN: 255 ng/mL (ref 9–269)

## 2016-12-08 MED ORDER — ERIBULIN MESYLATE CHEMO INJECTION 1 MG/2ML
1.3000 mg/m2 | Freq: Once | INTRAVENOUS | Status: AC
  Administered 2016-12-08: 2 mg via INTRAVENOUS
  Filled 2016-12-08: qty 4

## 2016-12-08 MED ORDER — SODIUM CHLORIDE 0.9% FLUSH
10.0000 mL | INTRAVENOUS | Status: DC | PRN
  Administered 2016-12-08: 10 mL
  Filled 2016-12-08: qty 10

## 2016-12-08 MED ORDER — DENOSUMAB 120 MG/1.7ML ~~LOC~~ SOLN
120.0000 mg | Freq: Once | SUBCUTANEOUS | Status: AC
  Administered 2016-12-08: 120 mg via SUBCUTANEOUS
  Filled 2016-12-08: qty 1.7

## 2016-12-08 MED ORDER — PROCHLORPERAZINE MALEATE 10 MG PO TABS
ORAL_TABLET | ORAL | Status: AC
  Filled 2016-12-08: qty 1

## 2016-12-08 MED ORDER — SODIUM CHLORIDE 0.9% FLUSH
10.0000 mL | INTRAVENOUS | Status: DC | PRN
  Administered 2016-12-08: 10 mL via INTRAVENOUS
  Filled 2016-12-08: qty 10

## 2016-12-08 MED ORDER — PROCHLORPERAZINE MALEATE 10 MG PO TABS
10.0000 mg | ORAL_TABLET | Freq: Once | ORAL | Status: AC
  Administered 2016-12-08: 10 mg via ORAL

## 2016-12-08 MED ORDER — ALTEPLASE 2 MG IJ SOLR
2.0000 mg | Freq: Once | INTRAMUSCULAR | Status: DC | PRN
  Filled 2016-12-08: qty 2

## 2016-12-08 MED ORDER — SODIUM CHLORIDE 0.9 % IV SOLN
Freq: Once | INTRAVENOUS | Status: AC
  Administered 2016-12-08: 12:00:00 via INTRAVENOUS

## 2016-12-08 MED ORDER — HEPARIN SOD (PORK) LOCK FLUSH 100 UNIT/ML IV SOLN
500.0000 [IU] | Freq: Once | INTRAVENOUS | Status: AC | PRN
  Administered 2016-12-08: 500 [IU]
  Filled 2016-12-08: qty 5

## 2016-12-08 NOTE — Progress Notes (Signed)
66 year old female diagnosed with breast cancer.    Past medical history includes dentures, tobacco, COPD  Medications include B complex vitamin, vitamin D, Ativan, Zofran.  Labs were reviewed.  Height: 5 feet 2 inches. Weight: 114 pounds. Usual body weight: 140 pounds. BMI: 21.  Patient denies nausea and vomiting. Constipation is controlled with medication. She reports some difficulty standing in order to cook meals. She reports her son is helpful to buy groceries for a meal if needed.  Nutrition diagnosis:  Severe malnutrition in chronic illness related to inadequate oral intake as evidenced by 19% weight loss, less than 75% energy intake for greater than one month, and depletion of muscle mass and body.  Intervention: Patient was educated to increase meals and snacks 6 times daily. Educated patient on high-calorie, high-protein foods. Recommended patient drink oral nutrition supplements twice a day and provided samples and coupons. Questions were answered.  Teach back method used.  Contact information given.  Monitoring, evaluation, goals:  Patient will tolerate increased calories and protein to minimize further weight loss.  Next visit: To be scheduled with treatments as needed.

## 2016-12-08 NOTE — Patient Instructions (Signed)

## 2016-12-08 NOTE — Progress Notes (Signed)
Patient Care Team: [name], MD (Inactive) as PCP - General (Family Medicine) Rothbart, Cristopher Estimable, MD (Cardiology) [name], MD as Consulting Physician (Gastroenterology)  DIAGNOSIS:  Encounter Diagnosis  Name Primary?  . 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 N1 M0 stage IIB ER 100%, PR 0%, HER-2       06/17/2014 -  Radiation Therapy    Adjuvant radiation therapy      09/14/2014 -  Anti-estrogen oral therapy    Anastrozole 1 mg daily      05/28/2015 Imaging    CT scans: Enlarging subpectoral masses 3.1 x 3.5 cm, posteriorly lower density mass 4.5 x 2.1 cm, several right-sided lung nodules right lower lobe 1.4 cm, 3 other right lung nodules, 1.7 cm right iliac bone lesion      06/21/2015 Procedure    Left subpectoral mass biopsy: Invasive high-grade ductal carcinoma ER 5%, PR 0%, HER-2 negative ratio 1.29      09/01/2015 Imaging    Left chest wall mass increased in size 7.2 x 5.1 cm, multiple subcutaneous nodules, increase in the lung nodules both in number as well as in the size of existing nodules      09/10/2015 -  Chemotherapy    Carboplatin, gemcitabine days 1 and 8 q 3 weeks, carboplatin discontinued for neuropathy (treatment break from 01/20/2016 to 03/24/2016); Added Carboplatin back 04/14/16 (for progression)      01/26/2016 Imaging    Marked improvement in size of lung nodules with many of the nodules resolved index right lower lobe nodule 1.9 x 1.8 cm is now 0.7 x 0.6 cm, reduction in the size of left breast mass 7.2 cm down to 2.6 cm, enlargement in the hypodense mass in the uterus      04/06/2016 Imaging    CT chest: Interval progression of pre-existing lung nodules new lung metastases (44m, 15m 2.9 cm, 1.6 cm), interval progression of disease in the left breast 4.2 cm (was 2.6 cm), additional nodules 2.3 cm and 3.2 cm; CT head: scalp mass 1.9 cm      06/27/2016 Imaging    Ct chest: Stable lesions in lt breast deep to Left  pectoralis, cystic lesion loc ant in left breast inc compared to previous; decrease in lung nodules (6mm to 3 mm; 12 mm to 7 mm, RML nodule 2.9 cm to 1.8 cm, RLL 14mm to 7 mm)      11/14/2016 Imaging    CT CAP:RP and retrocrural lymphadenopathy, large uterine fibroids with necrosis; MRI Abd: RP lymphadenopathy CT chest 11/20/16: Worsening bulky conglomeration of mediastinal and right hilar lymph nodes, lung nodules subcentimeter size slight increase, left breast mass was 3.7 x 5.8 cm now 5.4 x 7.4 cm       CHIEF COMPLIANT: Patient is here to start first cycle of Halaven  INTERVAL HISTORY: Paula Navarro is a 66-year-old with metastatic triple negative breast cancer who is here to receive her first dose of Halaven chemotherapy.  She met with Dr. cousins with GYN who performed endometrial biopsies and we are awaiting those results. She has a very large necrotic mass in the uterus which could be a primary uterine cancer or sarcoma or metastatic disease from the breast. As we await those results, I recommended starting on systemic chemotherapy. 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 After discussing extensively the risks and benefits we decided to initiate Halaven with very close monitoring. We may have to rapidly dose reduce Halaven if the neuropathy progresses.  REVIEW OF SYSTEMS:   Constitutional: Denies fevers, chills or abnormal weight loss Eyes: Denies blurriness of vision Ears, nose, mouth, throat, and face: Denies mucositis or sore throat Respiratory: Large pectoral mass left chest wall extending into the axilla Cardiovascular: Denies palpitation, chest discomfort Gastrointestinal:  Denies nausea, heartburn or change in bowel habits Skin: Denies abnormal skin rashes Lymphatics: Denies new lymphadenopathy or easy bruising Neurological:Denies numbness, tingling or new weaknesses Behavioral/Psych: Mood is stable, no new changes  Extremities: No lower extremity edema Chronic uterine bleeding All other systems were reviewed with the patient and are negative.  I have reviewed the past medical history, past surgical history, social history and family history with the patient and they are unchanged from previous note.  ALLERGIES:  is allergic to codeine and morphine and related.  MEDICATIONS:  Current Outpatient Prescriptions  Medication Sig Dispense Refill  . B Complex-C (SUPER B COMPLEX PO) Take 1 tablet by mouth daily.    . bisacodyl (LAXATIVE) 5 MG EC tablet Take 5 mg by mouth 2 (two) times daily.    . calcium-vitamin D (OSCAL-500) 500-400 MG-UNIT tablet Take 1 tablet by mouth 2 (two) times daily. 60 tablet 3  . cephALEXin (KEFLEX) 500 MG capsule Take 1 capsule (500 mg total) by  mouth 4 (four) times daily. 40 capsule 0  . dicyclomine (BENTYL) 10 MG capsule Take 1 capsule (10 mg total) by mouth 3 (three) times daily as needed for spasms. 30 capsule 0  . gabapentin (NEURONTIN) 300 MG capsule TAKE 2 CAPSULES (600 MG TOTAL) BY MOUTH 3 (THREE) TIMES DAILY. 180 capsule 2  . lidocaine-prilocaine (EMLA) cream Apply 1 application topically as needed. (Patient taking differently: Apply 1 application topically as needed (for port access). ) 30 g 6  . lidocaine-prilocaine (EMLA) cream Apply to affected area once 30 g 3  . LORazepam (ATIVAN) 1 MG tablet Take 1 tablet (1 mg total) by mouth at bedtime. 30 tablet 0  . mineral oil enema Place 1 enema rectally once.    . nystatin (MYCOSTATIN) 100000 UNIT/ML suspension Take 5 mLs (500,000 Units total) by mouth 4 (four) times daily. (Patient not taking:   Reported on 11/09/2016) 60 mL 0  . ondansetron (ZOFRAN) 4 MG tablet Take 1 tablet (4 mg total) by mouth every 8 (eight) hours as needed for nausea or vomiting. 30 tablet 0  . ondansetron (ZOFRAN) 8 MG tablet Take 1 tablet (8 mg total) by mouth 2 (two) times daily as needed (Nausea or vomiting). 30 tablet 1  . oxyCODONE (OXY IR/ROXICODONE) 5 MG immediate release tablet Take 1 tablet (5 mg total) by mouth every 6 (six) hours as needed for severe pain. 30 tablet 0  . polyethylene glycol (MIRALAX / GLYCOLAX) packet Take 17 g by mouth 2 (two) times daily. 14 each 0  . prochlorperazine (COMPAZINE) 10 MG tablet TAKE 1 TABLET EVERY 6 HOURS AS NEEDED NAUEA OR VOMITING (Patient not taking: Reported on 11/09/2016) 30 tablet 1  . prochlorperazine (COMPAZINE) 10 MG tablet Take 1 tablet (10 mg total) by mouth every 6 (six) hours as needed (Nausea or vomiting). 30 tablet 1  . traMADol (ULTRAM) 50 MG tablet Take 1 tablet (50 mg total) by mouth every 6 (six) hours as needed for moderate pain or severe pain. 60 tablet 0  . zolpidem (AMBIEN) 10 MG tablet Take 1 tablet (10 mg total) by mouth at bedtime. 30 tablet 2    No current facility-administered medications for this visit.     PHYSICAL EXAMINATION: ECOG PERFORMANCE STATUS: 1 - Symptomatic but completely ambulatory  Vitals:   12/08/16 1101  BP: 125/75  Pulse: 84  Resp: 16  Temp: 98.4 F (36.9 C)   Filed Weights   12/08/16 1101  Weight: 114 lb 12.8 oz (52.1 kg)    GENERAL:alert, no distress and comfortable SKIN: skin color, texture, turgor are normal, no rashes or significant lesions EYES: normal, Conjunctiva are pink and non-injected, sclera clear OROPHARYNX:no exudate, no erythema and lips, buccal mucosa, and tongue normal  NECK: supple, thyroid normal size, non-tender, without nodularity LYMPH:  no palpable lymphadenopathy in the cervical, axillary or inguinal LUNGS: clear to auscultation and percussion with normal breathing effort, large mass in the left pectoral area extending to the axilla this is ridiculous HEART: regular rate & rhythm and no murmurs and no lower extremity edema ABDOMEN:abdomen soft, non-tender and normal bowel sounds MUSCULOSKELETAL:no cyanosis of digits and no clubbing  NEURO: alert & oriented x 3 with fluent speech, no focal motor/sensory deficits EXTREMITIES: No lower extremity edema  LABORATORY DATA:  I have reviewed the data as listed   Chemistry      Component Value Date/Time   NA 131 (L) 12/08/2016 0930   K 3.8 12/08/2016 0930   CL 99 (L) 11/20/2016 2100   CO2 25 12/08/2016 0930   BUN 12.7 12/08/2016 0930   CREATININE 0.8 12/08/2016 0930      Component Value Date/Time   CALCIUM 9.7 12/08/2016 0930   ALKPHOS 66 12/08/2016 0930   AST 62 (H) 12/08/2016 0930   ALT 19 12/08/2016 0930   BILITOT 0.62 12/08/2016 0930       Lab Results  Component Value Date   WBC 10.8 (H) 12/08/2016   HGB 8.3 (L) 12/08/2016   HCT 27.2 (L) 12/08/2016   MCV 69.4 (L) 12/08/2016   PLT 255 12/08/2016   NEUTROABS 8.2 (H) 12/08/2016    ASSESSMENT & PLAN:  Bilateral breast cancer ([facility]) Bilateral breast cancers  are usually diagnosed 2015 treated with surgery, radiation, anastrozole Metastatic breast cancer diagnosed 05/28/2015 as subpectoral mass, ER 5%, PR 0%, HER-2 negative, failed Xeloda and carboplatin and gemcitabine  CT CAP  11/15/2015:RP and retrocrural lymphadenopathy, large uterine fibroids with necrosis;  MRI Abd: RP lymphadenopathy CT chest 11/20/16: Worsening bulky conglomeration of mediastinal and right hilar lymph nodes, lung nodules subcentimeter size slight increase, left breast mass was 3.7 x 5.8 cm now 5.4 x 7.4 cm  Current treatment: 1. Halaven days 1 and 8 every 3 weeks 12/08/2016 We will have to monitor closely for toxicities especially neuropathy.  Vaginal bleeding: Due to very large fibroids. Patient saw Dr. cousins yesterday. Dr. cousins and I had a long discussion after her visit. She underwent endometrial sampling and biopsy as well as a Pap smear. This may give [place] an idea there is a second primary tumor versus metastatic disease from the breast. Microcytic anemia: I will obtain iron studies and ferritin today.  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 Marlou Starks is planning surgery. She will need radiation if it is proven metastatic disease. Return to clinic in one week for toxicity check and follow-up   I spent 25 minutes talking to the patient of which more than half was spent in counseling and coordination of care.  Orders Placed This Encounter  Procedures  . Ferritin    Standing Status:   Future    Number of Occurrences:   1    Standing Expiration Date:   12/08/2017  . Iron and TIBC    Standing Status:   Future    Number of Occurrences:   1    Standing Expiration Date:   12/08/2017   The patient has a good understanding of the overall plan. she agrees with it. she will call with any problems that may develop before the next visit here.   Rulon Eisenmenger, MD 12/08/16

## 2016-12-08 NOTE — Patient Instructions (Addendum)
[facility] Discharge Instructions for Patients Receiving Chemotherapy  Today you received the following chemotherapy agents :  Eribulin ( Halaven ),  Xgeva.  To help prevent nausea and vomiting after your treatment, we encourage you to take your nausea medication as prescribed.   If you develop nausea and vomiting that is not controlled by your nausea medication, call the clinic.   BELOW ARE SYMPTOMS THAT SHOULD BE REPORTED IMMEDIATELY:  *FEVER GREATER THAN 100.5 F  *CHILLS WITH OR WITHOUT FEVER  NAUSEA AND VOMITING THAT IS NOT CONTROLLED WITH YOUR NAUSEA MEDICATION  *UNUSUAL SHORTNESS OF BREATH  *UNUSUAL BRUISING OR BLEEDING  TENDERNESS IN MOUTH AND THROAT WITH OR WITHOUT PRESENCE OF ULCERS  *URINARY PROBLEMS  *BOWEL PROBLEMS  UNUSUAL RASH Items with * indicate a potential emergency and should be followed up as soon as possible.  Feel free to call the clinic you have any questions or concerns. The clinic phone number is [phone].  Please show the [facility] at check-in to the Emergency Department and triage nurse.   Eribulin solution for injection What is this medicine? ERIBULIN (er e bu lin) is a chemotherapy drug. It is used to treat breast cancer and liposarcoma. This medicine may be used for other purposes; ask your health care provider or pharmacist if you have questions. COMMON BRAND NAME(S): Halaven What should I tell my health care provider before I take this medicine? They need to know if you have any of these conditions: -heart disease -history of irregular heartbeat -kidney disease -liver disease -low blood counts, like low white cell, platelet, or red cell counts -low levels of potassium or magnesium in the blood -an unusual or allergic reaction to eribulin, other medicines, foods, dyes, or preservatives -pregnant or trying to get pregnant -breast-feeding How should I use this medicine? This medicine is for infusion into a  vein. It is given by a health care professional in a hospital or clinic setting. Talk to your pediatrician regarding the use of this medicine in children. Special care may be needed. Overdosage: If you think you have taken too much of this medicine contact a poison control center or emergency room at once. NOTE: This medicine is only for you. Do not share this medicine with others. What if I miss a dose? It is important not to miss your dose. Call your doctor or health care professional if you are unable to keep an appointment. What may interact with this medicine? Do not take this medicine with any of the following medications: -amiodarone -astemizole -arsenic trioxide -bepridil -bretylium -chloroquine -chlorpromazine -cisapride -clarithromycin -dextromethorphan, quinidine -disopyramide -dofetilide -droperidol -dronedarone -erythromycin -grepafloxacin -halofantrine -haloperidol -ibutilide -levomethadyl -mesoridazine -methadone -pentamidine -procainamide -quinidine -pimozide -posaconazole -probucol -propafenone -saquinavir -sotalol -sparfloxacin -terfenadine -thioridazine -troleandomycin -ziprasidone This list may not describe all possible interactions. Give your health care provider a list of all the medicines, herbs, non-prescription drugs, or dietary supplements you use. Also tell them if you smoke, drink alcohol, or use illegal drugs. Some items may interact with your medicine. What should I watch for while using this medicine? This drug may make you feel generally unwell. This is not uncommon, as chemotherapy can affect healthy cells as well as cancer cells. Report any side effects. Continue your course of treatment even though you feel ill unless your doctor tells you to stop. Call your doctor or health care professional for advice if you get a fever, chills or sore throat, or other symptoms of a cold or flu. Do  not treat yourself. This drug decreases your body's  ability to fight infections. Try to avoid being around people who are sick. This medicine may increase your risk to bruise or bleed. Call your doctor or health care professional if you notice any unusual bleeding. You may need blood work done while you are taking this medicine. Do not become pregnant while taking this medicine or for 2 weeks after stopping it. Women should inform their doctor if they wish to become pregnant or think they might be pregnant. Men should not father a child while taking this medicine and for 3.5 months after stopping it. There is a potential for serious side effects to an unborn child. Talk to your health care professional or pharmacist for more information. Do not breast-feed an infant while taking this medicine or for 2 weeks after stopping it. What side effects may I notice from receiving this medicine? Side effects that you should report to your doctor or health care professional as soon as possible: -allergic reactions like skin rash, itching or hives, swelling of the face, lips, or tongue -low blood counts - this medicine may decrease the number of white blood cells, red blood cells and platelets. You may be at increased risk for infections and bleeding. -signs of infection - fever or chills, cough, sore throat, pain or difficulty passing urine -signs of decreased platelets or bleeding - bruising, pinpoint red spots on the skin, black, tarry stools, blood in the urine -signs of decreased red blood cells - unusually weak or tired, fainting spells, lightheadedness -pain, tingling, numbness in the hands or feet Side effects that usually do not require medical attention (report to your doctor or health care professional if they continue or are bothersome): -constipation -hair loss -headache -loss of appetite -muscle or joint pain -nausea, vomiting -stomach pain This list may not describe all possible side effects. Call your doctor for medical advice about side  effects. You may report side effects to FDA at 1-800-FDA-1088. Where should I keep my medicine? This drug is given in a hospital or clinic and will not be stored at home. NOTE: This sheet is a summary. It may not cover all possible information. If you have questions about this medicine, talk to your doctor, pharmacist, or health care provider.  2018 [employer]/Gold Standard (2015-08-19 10:11:26)

## 2016-12-11 ENCOUNTER — Encounter (HOSPITAL_COMMUNITY): Payer: Self-pay | Admitting: Emergency Medicine

## 2016-12-11 ENCOUNTER — Emergency Department (HOSPITAL_COMMUNITY)
Admission: EM | Admit: 2016-12-11 | Discharge: 2016-12-11 | Disposition: A | Discharge status: Home / Self Care | Payer: Medicare Other | Attending: Emergency Medicine | Admitting: Emergency Medicine

## 2016-12-11 DIAGNOSIS — Z853 Personal history of malignant neoplasm of breast: Secondary | ICD-10-CM | POA: Diagnosis not present

## 2016-12-11 DIAGNOSIS — R58 Hemorrhage, not elsewhere classified: Secondary | ICD-10-CM | POA: Insufficient documentation

## 2016-12-11 DIAGNOSIS — Z87891 Personal history of nicotine dependence: Secondary | ICD-10-CM | POA: Insufficient documentation

## 2016-12-11 DIAGNOSIS — T148XXA Other injury of unspecified body region, initial encounter: Secondary | ICD-10-CM

## 2016-12-11 DIAGNOSIS — Z85830 Personal history of malignant neoplasm of bone: Secondary | ICD-10-CM | POA: Diagnosis not present

## 2016-12-11 DIAGNOSIS — J449 Chronic obstructive pulmonary disease, unspecified: Secondary | ICD-10-CM | POA: Insufficient documentation

## 2016-12-11 DIAGNOSIS — Z79899 Other long term (current) drug therapy: Secondary | ICD-10-CM | POA: Diagnosis not present

## 2016-12-11 MED ORDER — SILVER NITRATE-POT NITRATE 75-25 % EX MISC
1.0000 "application " | Freq: Once | CUTANEOUS | Status: AC
  Administered 2016-12-11: 1 via TOPICAL
  Filled 2016-12-11: qty 1

## 2016-12-11 NOTE — ED Notes (Signed)
Bed: WA01 Expected date:  Expected time:  Means of arrival:  Comments: EMS , wound maintenance

## 2016-12-11 NOTE — ED Triage Notes (Signed)
Pt is from home where she states that she had a shower this morning and returned to bed when the "scab fell off the tumor on my head'.  Pt was unable to explain to EMS what her primary Cancer is but did state that she has breast Ca and that the tumor on her head is cancerous.  Pt was unable to control the bleeding at home so she called EMS.  Bleeding is now controlled.

## 2016-12-11 NOTE — ED Notes (Signed)
[facility] notified of need for Pt transportation

## 2016-12-11 NOTE — ED Provider Notes (Signed)
[facility] DEPT Provider Note   CSN: [id number] Arrival date & time: 12/11/16  1226     History   Chief Complaint Chief Complaint  Patient presents with  . Head wound    HPI Paula Navarro is a 66 y.o. female with hx of metastatic breast CA currently under chemo, anxiety Presents today with complaints of uncontrolled bleeding on her head. She reports removing a "scab" tumor on my head" after taking a shower she noticed and was woken up by a feeling of blood on her pillow. She states the tumor on her head is cancerous as well as a tumor near her left actual and also interested in being treated by her oncologist. She reports trying direct pressure at home with no relief. She reports EMS also tried direct pressure and was able to control bleeding. She denies any pain, fevers, chills, chest pain, shortness of breath, nausea, vomiting, diarrhea. She denies any other symptoms. She denies any blood thinner use.  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 Followed closely by Oncologist, Dr. Lindi Adie. Pt Had a scheduled surgery to remove cyst on scalp and was cancelled on 11/08/16.   The history is provided by the patient. No language interpreter was used.    Past Medical History:  Diagnosis Date  . Anemia   . Anxiety   . Atrial septal defect 1996   Surgical repair in 1996  . Breast cancer ([place])   . Chest pain    Admitted to [facility] in 09/2011; refused stress test  . Chronic bronchitis   . Chronic pain   . COPD (chronic obstructive pulmonary disease) ([place])    on xray  . Palpitation    Tachycardia reported by monitor clerk during a symptomatic spell  . Radiation 06/30/14-08/17/14   Bilateral Breast  . Tobacco abuse    60 pack years; 1.5 packs per day  . Wears dentures    top    Patient Active Problem List   Diagnosis Date Noted  . Protein-calorie malnutrition, severe 11/15/2016  . UTI (urinary tract infection) 11/14/2016  . COPD (chronic obstructive pulmonary disease)  ([place])   . 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 Chemotherapy-induced thrombocytopenia 06/13/2016  . Scalp lesion 04/01/2016  . Encounter for central line care 12/08/2015  . Port catheter in place 11/19/2015  . Insomnia 11/01/2015  . Microcytic anemia 09/28/2015  . Lung metastases ([place]) 09/10/2015  . Bone metastases ([place]) 07/20/2015  . Goals of care, counseling/discussion 07/13/2015  . Metastatic breast cancer ([place]) 06/29/2015  . Chronic pain 03/16/2015  . Vaginal bleeding 09/24/2014  . Postherpetic neuralgia 09/03/2014  . Lymphedema 09/03/2014  . Suspected herpes zoster left C5 distribution 08/14/2014  . Neuropathy due to chemotherapeutic drug ([place]) 03/03/2014  . Anxiety 03/03/2014  . Bilateral breast cancer ([place]) 08/11/2013  . Palpitation   . Chest pain   . Atrial septal defect   . Laboratory test 11/25/2011  . Chronic bronchitis   . Tobacco abuse     Past Surgical History:  Procedure Laterality Date  . ASD REPAIR, OSTIUM PRIMUM  1996   dr Roxy Horseman  . AXILLARY LYMPH NODE DISSECTION Bilateral 04/08/2014   Procedure:  BILATERAL AXILLARY LYMPH NODE DISSECTION;  Surgeon: Autumn Messing III, MD;  Location: [facility];  Service: General;  Laterality: Bilateral;  . BREAST BIOPSY Bilateral   . BREAST LUMPECTOMY WITH RADIOACTIVE SEED LOCALIZATION Bilateral 04/08/2014   Procedure: BILATERAL  RADIOACTIVE SEED LOCALIZATION LUMPECTOMY ;  Surgeon: Autumn Messing  III, MD;  Location: [facility];  Service: General;  Laterality: Bilateral;  . CESAREAN SECTION     x3  . CHOLECYSTECTOMY    . open heart surgery    . PORT A CATH REVISION  1/15   put in   . TUBAL LIGATION      OB History    Gravida Para Term Preterm AB Living   '4 3 3         '$ SAB TAB Ectopic Multiple Live Births                   Home Medications    Prior to Admission medications   Medication Sig Start Date End Date Taking? Authorizing Provider  B Complex-C (SUPER B COMPLEX  PO) Take 1 tablet by mouth daily.   Yes [provider]  calcium-vitamin D (OSCAL-500) 500-400 MG-UNIT tablet Take 1 tablet by mouth 2 (two) times daily. 08/18/15  Yes Nicholas Lose, MD  dicyclomine (BENTYL) 10 MG capsule Take 1 capsule (10 mg total) by mouth 3 (three) times daily as needed for spasms. 11/28/16  Yes Nicholas Lose, MD  gabapentin (NEURONTIN) 300 MG capsule TAKE 2 CAPSULES (600 MG TOTAL) BY MOUTH 3 (THREE) TIMES DAILY. 12/01/16  Yes Nicholas Lose, MD  lidocaine-prilocaine (EMLA) cream Apply 1 application topically as needed. Patient taking differently: Apply 1 application topically as needed (for port access).  10/12/16  Yes Nicholas Lose, MD  LORazepam (ATIVAN) 1 MG tablet Take 1 tablet (1 mg total) by mouth at bedtime. 11/07/16  Yes Nicholas Lose, MD  ondansetron (ZOFRAN) 8 MG tablet Take 1 tablet (8 mg total) by mouth 2 (two) times daily as needed (Nausea or vomiting). 11/27/16  Yes Nicholas Lose, MD  oxyCODONE (OXY IR/ROXICODONE) 5 MG immediate release tablet Take 1 tablet (5 mg total) by mouth every 6 (six) hours as needed for severe pain. 11/09/16  Yes Hayden Pedro, PA-C  polyethylene glycol [facility] Floria Raveling) packet Take 17 g by mouth 2 (two) times daily. 11/16/16  Yes Eber Jones, MD  traMADol (ULTRAM) 50 MG tablet Take 1 tablet (50 mg total) by mouth every 6 (six) hours as needed for moderate pain or severe pain. 12/01/16  Yes Nicholas Lose, MD  zolpidem (AMBIEN) 10 MG tablet Take 1 tablet (10 mg total) by mouth at bedtime. 11/27/16  Yes Nicholas Lose, MD  cephALEXin (KEFLEX) 500 MG capsule Take 1 capsule (500 mg total) by mouth 4 (four) times daily. Patient not taking: Reported on 12/11/2016 11/20/16   Francine Graven, DO  lidocaine-prilocaine (EMLA) cream Apply to affected area once 11/27/16   Nicholas Lose, MD  nystatin (MYCOSTATIN) 100000 UNIT/ML suspension Take 5 mLs (500,000 Units total) by mouth 4 (four) times daily. Patient not taking: Reported on  11/09/2016 08/03/16   Nicholas Lose, MD  ondansetron (ZOFRAN) 4 MG tablet Take 1 tablet (4 mg total) by mouth every 8 (eight) hours as needed for nausea or vomiting. 07/28/16   Nicholas Lose, MD  prochlorperazine (COMPAZINE) 10 MG tablet TAKE 1 TABLET EVERY 6 HOURS AS NEEDED NAUEA OR VOMITING Patient not taking: Reported on 11/09/2016 10/12/16   Nicholas Lose, MD  prochlorperazine (COMPAZINE) 10 MG tablet Take 1 tablet (10 mg total) by mouth every 6 (six) hours as needed (Nausea or vomiting). Patient not taking: Reported on 12/11/2016 11/27/16   Nicholas Lose, MD    Family History Family History  Problem Relation Age of Onset  . Breast cancer Maternal Aunt  Social History Social History  Substance Use Topics  . Smoking status: Former Smoker    Packs/day: 1.50    Years: 40.00    Types: Cigarettes    Quit date: 08/01/2012  . Smokeless tobacco: Never Used  . Alcohol use No     Allergies   Codeine and Morphine and related   Review of Systems Review of Systems  Constitutional: Negative for chills and fever.  Skin: Positive for wound.       On scalp.  All other systems reviewed and are negative.    Physical Exam Updated Vital Signs BP 123/72 (BP Location: Right Arm)   Pulse 90   Temp 98.5 F (36.9 C)   Resp 18   Ht '5\' 2"'$  (1.575 m)   Wt 51.7 kg   LMP 05/20/2011   SpO2 96%   BMI 20.85 kg/m   Physical Exam  Constitutional: She appears well-developed and well-nourished.  Well appearing  HENT:  Head: Normocephalic and atraumatic.  Nose: Nose normal.  Scalp with area of swelling and bulging on top right of head. Non tender to palpation. Leasion is about 4cm in diameter and extends about 2.5 cm outward. Small punctate site of active bleeding on exam. No surrounding erythema.   Eyes: Conjunctivae and EOM are normal.  Neck: Normal range of motion.  Cardiovascular: Normal rate, normal heart sounds and intact distal pulses.   No murmur heard. Pulmonary/Chest: Effort normal  and breath sounds normal. No respiratory distress. She has no wheezes. She has no rales.  Normal work of breathing. No respiratory distress noted.   Abdominal: Soft. There is no tenderness. There is no rebound and no guarding.  Musculoskeletal: Normal range of motion.  Tumor size, hardness and swelling also located to her anterior axilla left side. Nontender to palpation. About 4 cm in diameter and extending 3 cm. no active bleeding. No surrounding erythema.  Neurological: She is alert.  Skin: Skin is warm. Capillary refill takes less than 2 seconds.  Psychiatric: She has a normal mood and affect. Her behavior is normal.  Nursing note and vitals reviewed.    ED Treatments / Results  Labs (all labs ordered are listed, but only abnormal results are displayed) Labs Reviewed - No data to display  EKG  EKG Interpretation None       Radiology No results found.  Procedures Procedures (including critical care time)  Medications Ordered in ED Medications  silver nitrate applicators applicator 1 application (1 application Topical Given 12/11/16 1402)     Initial Impression / Assessment and Plan / ED Course  I have reviewed the triage vital signs and the nursing notes.  Pertinent labs & imaging results that were available during my care of the patient were reviewed by me and considered in my medical decision making (see chart for details).     On exam patient had small punctate active bleeding. He controlled with silver nitrate.. Nontender to palpation, no surrounding erythema, does not appear infectious. No discharge noted. Patient will be given instruction to follow-up with her primary care provider and oncologist tomorrow regarding today's visit. Patient also given instruction to not scratch and area which will cause bleeding again. Feels safe for discharge at this time. Patient is afebrile, hemoglobin stable, in no apparent distress. Pt also seen and evaluated by Dr. Laverta Baltimore who agreed  with assessment and plan. Patient verbally understand and is agreeable with assessment and plan.   Final Clinical Impressions(s) / ED Diagnoses   Final diagnoses:  Bleeding from wound    New Prescriptions New Prescriptions   No medications on file     Bettey Costa, [place] 12/11/16 1450    Margette Fast, MD 12/11/16 2002

## 2016-12-11 NOTE — Discharge Instructions (Signed)
Please follow-up with your oncologist and primary care provider tomorrow regarding today's visit. Keep area clean and dry. Do not scratch at it.   Please return for any increase uncontrolled bleeding, weakness, visual symptoms, fatigue, or any other new or worsening symptoms.

## 2016-12-12 ENCOUNTER — Telehealth: Payer: Self-pay

## 2016-12-12 NOTE — Telephone Encounter (Signed)
Returned patient's call; left message with patient to call this office back; per Dr Lindi Adie patient needs to call Dr Ethlyn Gallery office and been seen in their urgent clinic if bleeding persists.

## 2016-12-12 NOTE — Telephone Encounter (Signed)
Pt called stating she went to ER for the tumor in her head hemorrhaging. She states it is bleeding again.  s/w Kaleen Odea and she will talk with Dr Lindi Adie

## 2016-12-14 ENCOUNTER — Telehealth: Payer: Self-pay | Admitting: Emergency Medicine

## 2016-12-14 NOTE — Telephone Encounter (Signed)
Called patient for chemo follow up call. Patient denies any concerns other than reoccurring vaginal bleeding. Patient states bleeding on her scalp has resolved at this time.  Confirmed office visit for 5/18 with patient and advised her to call for any changes. Patient verbalized understanding.

## 2016-12-14 NOTE — Assessment & Plan Note (Signed)
Bilateral breast cancers are usually diagnosed 2015 treated with surgery, radiation, anastrozole Metastatic breast cancer diagnosed 05/28/2015 as subpectoral mass, ER 5%, PR 0%, HER-2 negative, failed Xeloda and carboplatin and gemcitabine  CT CAP 11/15/2015:RP and retrocrural lymphadenopathy, large uterine fibroids with necrosis;  MRI Abd: RP lymphadenopathy CT chest 11/20/16: Worsening bulky conglomeration of mediastinal and right hilar lymph nodes, lung nodules subcentimeter size slight increase, left breast mass was 3.7 x 5.8 cm now 5.4 x 7.4 cm  Current treatment: 1. [place] days 1 and 8 every 3 weeks started 12/08/2016. Today is cycle 1 day 8 We will have to monitor closely for toxicities especially neuropathy.  Vaginal bleeding: Due to very large fibroids. Patient saw Dr. cousins yesterday. We had long discussion after her visit. She underwent endometrial sampling and biopsy as well as a Pap smear. This may give [place] an idea there is a second primary tumor versus metastatic disease from the breast.  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 Marlou Starks is planning surgery. She will need radiation if it is proven metastatic disease. Return to clinic in one week for toxicity check and follow-up

## 2016-12-15 ENCOUNTER — Ambulatory Visit (HOSPITAL_BASED_OUTPATIENT_CLINIC_OR_DEPARTMENT_OTHER): Payer: Medicare Other | Admitting: Hematology and Oncology

## 2016-12-15 ENCOUNTER — Encounter: Payer: Self-pay | Admitting: Hematology and Oncology

## 2016-12-15 ENCOUNTER — Ambulatory Visit: Payer: Medicare Other

## 2016-12-15 ENCOUNTER — Other Ambulatory Visit (HOSPITAL_BASED_OUTPATIENT_CLINIC_OR_DEPARTMENT_OTHER): Payer: Medicare Other

## 2016-12-15 ENCOUNTER — Ambulatory Visit (HOSPITAL_BASED_OUTPATIENT_CLINIC_OR_DEPARTMENT_OTHER): Payer: Medicare Other

## 2016-12-15 VITALS — BP 133/76 | HR 92 | Temp 98.6°F | Resp 18 | Ht 62.0 in | Wt 118.3 lb

## 2016-12-15 DIAGNOSIS — C7989 Secondary malignant neoplasm of other specified sites: Secondary | ICD-10-CM | POA: Diagnosis not present

## 2016-12-15 DIAGNOSIS — C50012 Malignant neoplasm of nipple and areola, left female breast: Secondary | ICD-10-CM

## 2016-12-15 DIAGNOSIS — C770 Secondary and unspecified malignant neoplasm of lymph nodes of head, face and neck: Secondary | ICD-10-CM

## 2016-12-15 DIAGNOSIS — Z170 Estrogen receptor positive status [ER+]: Secondary | ICD-10-CM

## 2016-12-15 DIAGNOSIS — C50912 Malignant neoplasm of unspecified site of left female breast: Secondary | ICD-10-CM

## 2016-12-15 DIAGNOSIS — C7800 Secondary malignant neoplasm of unspecified lung: Secondary | ICD-10-CM

## 2016-12-15 DIAGNOSIS — C50919 Malignant neoplasm of unspecified site of unspecified female breast: Secondary | ICD-10-CM

## 2016-12-15 DIAGNOSIS — C50011 Malignant neoplasm of nipple and areola, right female breast: Secondary | ICD-10-CM

## 2016-12-15 DIAGNOSIS — C7951 Secondary malignant neoplasm of bone: Secondary | ICD-10-CM

## 2016-12-15 DIAGNOSIS — C50911 Malignant neoplasm of unspecified site of right female breast: Secondary | ICD-10-CM

## 2016-12-15 LAB — COMPREHENSIVE METABOLIC PANEL
ALT: 24 U/L (ref 0–55)
AST: 57 U/L — ABNORMAL HIGH (ref 5–34)
Albumin: 2.7 g/dL — ABNORMAL LOW (ref 3.5–5.0)
Alkaline Phosphatase: 66 U/L (ref 40–150)
Anion Gap: 11 mEq/L (ref 3–11)
BILIRUBIN TOTAL: 0.47 mg/dL (ref 0.20–1.20)
BUN: 8.8 mg/dL (ref 7.0–26.0)
CO2: 27 meq/L (ref 22–29)
CREATININE: 0.7 mg/dL (ref 0.6–1.1)
Calcium: 10.2 mg/dL (ref 8.4–10.4)
Chloride: 97 mEq/L — ABNORMAL LOW (ref 98–109)
EGFR: >90 mL/min/{1.73_m2} (ref >90)
GLUCOSE: 139 mg/dL (ref 70–140)
Potassium: 3.7 mEq/L (ref 3.5–5.1)
SODIUM: 135 meq/L — AB (ref 136–145)
TOTAL PROTEIN: 7.7 g/dL (ref 6.4–8.3)

## 2016-12-15 LAB — CBC WITH DIFFERENTIAL/PLATELET
BASO%: 0.6 % (ref 0.0–2.0)
Basophils Absolute: 0 10*3/uL (ref 0.0–0.1)
EOS%: 0.8 % (ref 0.0–7.0)
Eosinophils Absolute: 0 10*3/uL (ref 0.0–0.5)
HCT: 26.7 % — ABNORMAL LOW (ref 34.8–46.6)
HGB: 8.4 g/dL — ABNORMAL LOW (ref 11.6–15.9)
LYMPH%: 50.4 % — AB (ref 14.0–49.7)
MCH: 21.2 pg — ABNORMAL LOW (ref 25.1–34.0)
MCHC: 31.5 g/dL (ref 31.5–36.0)
MCV: 67.2 fL — ABNORMAL LOW (ref 79.5–101.0)
MONO#: 0.6 10*3/uL (ref 0.1–0.9)
MONO%: 13.3 % (ref 0.0–14.0)
NEUT%: 34.9 % — ABNORMAL LOW (ref 38.4–76.8)
NEUTROS ABS: 1.5 10*3/uL (ref 1.5–6.5)
Platelets: 288 10*3/uL (ref 145–400)
RBC: 3.98 10*6/uL (ref 3.70–5.45)
RDW: 21.7 % — ABNORMAL HIGH (ref 11.2–14.5)
WBC: 4.4 10*3/uL (ref 3.9–10.3)
lymph#: 2.2 10*3/uL (ref 0.9–3.3)

## 2016-12-15 MED ORDER — SODIUM CHLORIDE 0.9 % IV SOLN
1.3000 mg/m2 | Freq: Once | INTRAVENOUS | Status: AC
  Administered 2016-12-15: 2 mg via INTRAVENOUS
  Filled 2016-12-15: qty 4

## 2016-12-15 MED ORDER — HEPARIN SOD (PORK) LOCK FLUSH 100 UNIT/ML IV SOLN
500.0000 [IU] | Freq: Once | INTRAVENOUS | Status: AC | PRN
  Administered 2016-12-15: 500 [IU]
  Filled 2016-12-15: qty 5

## 2016-12-15 MED ORDER — PROCHLORPERAZINE MALEATE 10 MG PO TABS
ORAL_TABLET | ORAL | Status: AC
  Filled 2016-12-15: qty 1

## 2016-12-15 MED ORDER — PROCHLORPERAZINE MALEATE 10 MG PO TABS
10.0000 mg | ORAL_TABLET | Freq: Once | ORAL | Status: AC
  Administered 2016-12-15: 10 mg via ORAL

## 2016-12-15 MED ORDER — SODIUM CHLORIDE 0.9 % IV SOLN
Freq: Once | INTRAVENOUS | Status: AC
  Administered 2016-12-15: 13:00:00 via INTRAVENOUS

## 2016-12-15 MED ORDER — SODIUM CHLORIDE 0.9% FLUSH
10.0000 mL | INTRAVENOUS | Status: DC | PRN
  Administered 2016-12-15: 10 mL
  Filled 2016-12-15: qty 10

## 2016-12-15 NOTE — Progress Notes (Signed)
Patient Care Team: [name], MD (Inactive) as PCP - General (Family Medicine) Rothbart, Cristopher Estimable, MD (Cardiology) [name], MD as Consulting Physician (Gastroenterology)  DIAGNOSIS:  Encounter Diagnosis  Name Primary?  . Bilateral malignant neoplasm involving both nipple and areola in female, unspecified estrogen receptor status ([place]) Yes    SUMMARY OF ONCOLOGIC HISTORY:   Bilateral breast cancer ([place])   07/23/2013 Mammogram    Bilateral breast masses. 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 N1 M0 stage IIB ER 100%, PR 0%, HER-2       06/17/2014 -  Radiation Therapy    Adjuvant radiation therapy      09/14/2014 -  Anti-estrogen oral therapy    Anastrozole 1 mg daily      05/28/2015 Imaging    CT scans: Enlarging subpectoral masses 3.1 x 3.5 cm, posteriorly lower density mass 4.5 x 2.1 cm, several right-sided lung nodules right lower lobe 1.4 cm, 3 other right lung nodules, 1.7 cm right iliac bone lesion      06/21/2015 Procedure    Left subpectoral mass biopsy: Invasive high-grade ductal carcinoma ER 5%, PR 0%, HER-2 negative ratio 1.29      09/01/2015 Imaging    Left chest wall mass increased in size 7.2 x 5.1 cm, multiple subcutaneous nodules, increase in the lung nodules both in number as well as in the size of existing nodules      09/10/2015 -  Chemotherapy    Carboplatin, gemcitabine days 1 and 8 q 3 weeks, carboplatin discontinued for neuropathy (treatment break from 01/20/2016 to 03/24/2016); Added Carboplatin back 04/14/16 (for progression)      01/26/2016 Imaging    Marked improvement in size of lung nodules with many of the nodules resolved index right lower lobe nodule 1.9 x 1.8 cm is now 0.7 x 0.6 cm, reduction in the size of left breast mass 7.2 cm down to 2.6 cm, enlargement in the hypodense mass in the uterus      04/06/2016 Imaging    CT chest: Interval progression of pre-existing lung nodules new lung metastases (51m, 130m 2.9 cm, 1.6 cm), interval progression of disease in the left breast 4.2 cm (was 2.6 cm), additional nodules 2.3 cm and 3.2 cm; CT head: scalp mass 1.9 cm      06/27/2016 Imaging    Ct chest: Stable lesions in lt breast deep to Left  pectoralis, cystic lesion loc ant in left breast inc compared to previous; decrease in lung nodules (47m to 3 mm; 12 mm to 7 mm, RML nodule 2.9 cm to 1.8 cm, RLL 172mto 7 mm)      11/14/2016 Imaging    CT CAP:RP and retrocrural lymphadenopathy, large uterine fibroids with necrosis; MRI Abd: RP lymphadenopathy CT chest 11/20/16: Worsening bulky conglomeration of mediastinal and right hilar lymph nodes, lung nodules subcentimeter size slight increase, left breast mass was 3.7 x 5.8 cm now 5.4 x 7.4 cm      12/08/2016 -  Chemotherapy    Palliative chemotherapy with Halaven days 1 and 8 every 3 weeks        CHIEF COMPLIANT: Cycle 1 day 8 Halaven  INTERVAL HISTORY: Paula VERNETs a [age]ear old with above-mentioned symptoms  metastatic breast cancer currently on palliative chemotherapy with Halaven. Today is cycle 1 day 8. She tells me she tolerated cycle 1 day 1 extremely well. She did not have any nausea vomiting. Neuropathy did not get any worse. She was in the emergency room with a bleeding scalp lesion. They tried to stop the bleeding and put a bandage on it. She continues to have profuse bleeding from the uterus and had seen Dr. cousins who performed a biopsy, the final results are not available to me but they are suspicious for malignancy. She was referred to [name] evaluation for hysterectomy. The left chest wall lesion is extremely large and the patient tells me that it has shrunk in size even in this one week.  REVIEW OF SYSTEMS:   Constitutional: Denies fevers, chills or abnormal weight loss, patient looks fatigued Eyes: Denies blurriness of vision Ears, nose, mouth, throat, and face: Denies mucositis or sore throat, scalp lesion that has been bandaged no longer bleeding Respiratory: Denies cough, dyspnea or wheezes Cardiovascular: Denies palpitation, chest discomfort Gastrointestinal:  Denies nausea, heartburn or change in bowel habits Skin: Denies abnormal skin rashes Lymphatics: Denies new lymphadenopathy or easy bruising Neurological: Neuropathy in hands and feet stable Behavioral/Psych: Mood is stable, no new changes  Extremities: No lower extremity edema Breast: Left chest wall mass extremely large extending into the left axilla All other systems were reviewed with the patient and are negative.  I have reviewed the past medical history, past surgical history, social history and family history with the patient and they are unchanged from previous note.  ALLERGIES:  is allergic to codeine and morphine and related.  MEDICATIONS:  Current Outpatient Prescriptions  Medication Sig Dispense Refill  . B Complex-C (SUPER B COMPLEX PO) Take 1 tablet by mouth daily.    . calcium-vitamin D  (OSCAL-500) 500-400 MG-UNIT tablet Take 1 tablet by mouth 2 (two) times daily. 60 tablet 3  . cephALEXin (KEFLEX) 500 MG capsule Take 1 capsule (500 mg total) by mouth 4 (four) times daily. (Patient not taking: Reported on 12/11/2016) 40 capsule 0  . dicyclomine (BENTYL) 10 MG capsule Take 1 capsule (10 mg total) by mouth 3 (three) times daily as needed for spasms. 30 capsule 0  . gabapentin (NEURONTIN) 300 MG capsule TAKE 2 CAPSULES (600 MG TOTAL) BY MOUTH 3 (THREE) TIMES DAILY. 180 capsule 2  . lidocaine-prilocaine (EMLA) cream Apply 1 application topically as needed. (Patient taking differently: Apply 1 application topically as needed (for port access). ) 30 g 6  . lidocaine-prilocaine (EMLA) cream Apply to affected area once 30 g 3  . LORazepam (ATIVAN) 1 MG tablet Take 1 tablet (  1 mg total) by mouth at bedtime. 30 tablet 0  . nystatin (MYCOSTATIN) 100000 UNIT/ML suspension Take 5 mLs (500,000 Units total) by mouth 4 (four) times daily. (Patient not taking: Reported on 11/09/2016) 60 mL 0  . ondansetron (ZOFRAN) 4 MG tablet Take 1 tablet (4 mg total) by mouth every 8 (eight) hours as needed for nausea or vomiting. 30 tablet 0  . ondansetron (ZOFRAN) 8 MG tablet Take 1 tablet (8 mg total) by mouth 2 (two) times daily as needed (Nausea or vomiting). 30 tablet 1  . oxyCODONE (OXY IR/ROXICODONE) 5 MG immediate release tablet Take 1 tablet (5 mg total) by mouth every 6 (six) hours as needed for severe pain. 30 tablet 0  . polyethylene glycol (MIRALAX / GLYCOLAX) packet Take 17 g by mouth 2 (two) times daily. 14 each 0  . prochlorperazine (COMPAZINE) 10 MG tablet TAKE 1 TABLET EVERY 6 HOURS AS NEEDED NAUEA OR VOMITING (Patient not taking: Reported on 11/09/2016) 30 tablet 1  . prochlorperazine (COMPAZINE) 10 MG tablet Take 1 tablet (10 mg total) by mouth every 6 (six) hours as needed (Nausea or vomiting). (Patient not taking: Reported on 12/11/2016) 30 tablet 1  . traMADol (ULTRAM) 50 MG tablet Take 1 tablet  (50 mg total) by mouth every 6 (six) hours as needed for moderate pain or severe pain. 60 tablet 0  . zolpidem (AMBIEN) 10 MG tablet Take 1 tablet (10 mg total) by mouth at bedtime. 30 tablet 2   No current facility-administered medications for this visit.     PHYSICAL EXAMINATION: ECOG PERFORMANCE STATUS: 3 - Symptomatic, >50% confined to bed  Vitals:   12/15/16 1228  BP: 133/76  Pulse: 92  Resp: 18  Temp: 98.6 F (37 C)   Filed Weights   12/15/16 1228  Weight: 118 lb 4.8 oz (53.7 kg)    GENERAL:alert, no distress and comfortable SKIN: skin color, texture, turgor are normal, no rashes or significant lesions; lesion on the scalp no longer bleeding EYES: normal, Conjunctiva are pink and non-injected, sclera clear OROPHARYNX:no exudate, no erythema and lips, buccal mucosa, and tongue normal  NECK: supple, thyroid normal size, non-tender, without nodularity LYMPH:  no palpable lymphadenopathy in the cervical, axillary or inguinal LUNGS: clear to auscultation and percussion with normal breathing effort HEART: regular rate & rhythm and no murmurs and no lower extremity edema ABDOMEN:abdomen soft, non-tender and normal bowel sounds MUSCULOSKELETAL:no cyanosis of digits and no clubbing  NEURO: alert & oriented x 3 with fluent speech, no focal motor/sensory deficits EXTREMITIES: No lower extremity edema BREAST: Left chest wall lesion extremely large  LABORATORY DATA:  I have reviewed the data as listed   Chemistry      Component Value Date/Time   NA 135 (L) 12/15/2016 1141   K 3.7 12/15/2016 1141   CL 99 (L) 11/20/2016 2100   CO2 27 12/15/2016 1141   BUN 8.8 12/15/2016 1141   CREATININE 0.7 12/15/2016 1141      Component Value Date/Time   CALCIUM 10.2 12/15/2016 1141   ALKPHOS 66 12/15/2016 1141   AST 57 (H) 12/15/2016 1141   ALT 24 12/15/2016 1141   BILITOT 0.47 12/15/2016 1141       Lab Results  Component Value Date   WBC 4.4 12/15/2016   HGB 8.4 (L) 12/15/2016     HCT 26.7 (L) 12/15/2016   MCV 67.2 (L) 12/15/2016   PLT 288 12/15/2016   NEUTROABS 1.5 12/15/2016    ASSESSMENT & PLAN:  Bilateral  breast cancer ([place]) Bilateral breast cancers are usually diagnosed 2015 treated with surgery, radiation, anastrozole Metastatic breast cancer diagnosed 05/28/2015 as subpectoral mass, ER 5%, PR 0%, HER-2 negative, failed Xeloda and carboplatin and gemcitabine  CT CAP 11/15/2015:RP and retrocrural lymphadenopathy, large uterine fibroids with necrosis;  MRI Abd: RP lymphadenopathy CT chest 11/20/16: Worsening bulky conglomeration of mediastinal and right hilar lymph nodes, lung nodules subcentimeter size slight increase, left breast mass was 3.7 x 5.8 cm now 5.4 x 7.4 cm  Current treatment: 1. Halaven days 1 and 8 every 3 weeks started 12/08/2016. Today is cycle 1 day 8 We will have to monitor closely for toxicities especially neuropathy.  Vaginal bleeding: Due to very large fibroids. Patient saw Dr. Garwin Brothers yesterday. We had long discussion after her visit. She underwent endometrial sampling and biopsy as well as a Pap smear. I do not have the final pathology report but apparently was malignant. She will be seeing Dr. 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 Marlou Starks as previously planned surgery but she canceled it previously. I spoke with Dr. Marlou Starks about her situation. Once the GYN issues are taken care of I will refer the patient back to see Dr. Marlou Starks. She will need radiation after resection  Return to clinic in 2 weeks for toxicity check and follow-up with cycle 2   I spent 25 minutes talking to the patient of which more than half was spent in counseling and coordination of care.  No orders of the defined types were placed in this encounter.  The patient has a good understanding of the overall plan. she agrees with it. she will call with any problems that may develop before the next visit here.   Rulon Eisenmenger,  MD 12/15/16

## 2016-12-15 NOTE — Patient Instructions (Signed)
Implanted Port Home Guide An implanted port is a type of central line that is placed under the skin. Central lines are used to provide IV access when treatment or nutrition needs to be given through a person's veins. Implanted ports are used for long-term IV access. An implanted port may be placed because:  You need IV medicine that would be irritating to the small veins in your hands or arms.  You need long-term IV medicines, such as antibiotics.  You need IV nutrition for a long period.  You need frequent blood draws for lab tests.  You need dialysis.  Implanted ports are usually placed in the chest area, but they can also be placed in the upper arm, the abdomen, or the leg. An implanted port has two main parts:  Reservoir. The reservoir is round and will appear as a small, raised area under your skin. The reservoir is the part where a needle is inserted to give medicines or draw blood.  Catheter. The catheter is a thin, flexible tube that extends from the reservoir. The catheter is placed into a large vein. Medicine that is inserted into the reservoir goes into the catheter and then into the vein.  How will I care for my incision site? Do not get the incision site wet. Bathe or shower as directed by your health care provider. How is my port accessed? Special steps must be taken to access the port:  Before the port is accessed, a numbing cream can be placed on the skin. This helps numb the skin over the port site.  Your health care provider uses a sterile technique to access the port. ? Your health care provider must put on a mask and sterile gloves. ? The skin over your port is cleaned carefully with an antiseptic and allowed to dry. ? The port is gently pinched between sterile gloves, and a needle is inserted into the port.  Only "non-coring" port needles should be used to access the port. Once the port is accessed, a blood return should be checked. This helps ensure that the port  is in the vein and is not clogged.  If your port needs to remain accessed for a constant infusion, a clear (transparent) bandage will be placed over the needle site. The bandage and needle will need to be changed every week, or as directed by your health care provider.  Keep the bandage covering the needle clean and dry. Do not get it wet. Follow your health care provider's instructions on how to take a shower or bath while the port is accessed.  If your port does not need to stay accessed, no bandage is needed over the port.  What is flushing? Flushing helps keep the port from getting clogged. Follow your health care provider's instructions on how and when to flush the port. Ports are usually flushed with saline solution or a medicine called heparin. The need for flushing will depend on how the port is used.  If the port is used for intermittent medicines or blood draws, the port will need to be flushed: ? After medicines have been given. ? After blood has been drawn. ? As part of routine maintenance.  If a constant infusion is running, the port may not need to be flushed.  How long will my port stay implanted? The port can stay in for as long as your health care provider thinks it is needed. When it is time for the port to come out, surgery will be   done to remove it. The procedure is similar to the one performed when the port was put in. When should I seek immediate medical care? When you have an implanted port, you should seek immediate medical care if:  You notice a bad smell coming from the incision site.  You have swelling, redness, or drainage at the incision site.  You have more swelling or pain at the port site or the surrounding area.  You have a fever that is not controlled with medicine.  This information is not intended to replace advice given to you by your health care provider. Make sure you discuss any questions you have with your health care provider. Document  Released: 07/17/2005 Document Revised: 12/23/2015 Document Reviewed: 03/24/2013 Elsevier Interactive Patient Education  2017 Else[employer]  

## 2016-12-15 NOTE — Patient Instructions (Signed)
[facility] Discharge Instructions for Patients Receiving Chemotherapy  Today you received the following chemotherapy agents :  Eribulin ( Halaven )  To help prevent nausea and vomiting after your treatment, we encourage you to take your nausea medication as prescribed.   If you develop nausea and vomiting that is not controlled by your nausea medication, call the clinic.   BELOW ARE SYMPTOMS THAT SHOULD BE REPORTED IMMEDIATELY:  *FEVER GREATER THAN 100.5 F  *CHILLS WITH OR WITHOUT FEVER  NAUSEA AND VOMITING THAT IS NOT CONTROLLED WITH YOUR NAUSEA MEDICATION  *UNUSUAL SHORTNESS OF BREATH  *UNUSUAL BRUISING OR BLEEDING  TENDERNESS IN MOUTH AND THROAT WITH OR WITHOUT PRESENCE OF ULCERS  *URINARY PROBLEMS  *BOWEL PROBLEMS  UNUSUAL RASH Items with * indicate a potential emergency and should be followed up as soon as possible.  Feel free to call the clinic you have any questions or concerns. The clinic phone number is [phone].  Please show the [place] [facility] at check-in to the Emergency Department and triage nurse.   Eribulin solution for injection What is this medicine? ERIBULIN (er e bu lin) is a chemotherapy drug. It is used to treat breast cancer and liposarcoma. This medicine may be used for other purposes; ask your health care provider or pharmacist if you have questions. COMMON BRAND NAME(S): Halaven What should I tell my health care provider before I take this medicine? They need to know if you have any of these conditions: -heart disease -history of irregular heartbeat -kidney disease -liver disease -low blood counts, like low white cell, platelet, or red cell counts -low levels of potassium or magnesium in the blood -an unusual or allergic reaction to eribulin, other medicines, foods, dyes, or preservatives -pregnant or trying to get pregnant -breast-feeding How should I use this medicine? This medicine is for infusion into a vein. It is  given by a health care professional in a hospital or clinic setting. Talk to your pediatrician regarding the use of this medicine in children. Special care may be needed. Overdosage: If you think you have taken too much of this medicine contact a poison control center or emergency room at once. NOTE: This medicine is only for you. Do not share this medicine with others. What if I miss a dose? It is important not to miss your dose. Call your doctor or health care professional if you are unable to keep an appointment. What may interact with this medicine? Do not take this medicine with any of the following medications: -amiodarone -astemizole -arsenic trioxide -bepridil -bretylium -chloroquine -chlorpromazine -cisapride -clarithromycin -dextromethorphan, quinidine -disopyramide -dofetilide -droperidol -dronedarone -erythromycin -grepafloxacin -halofantrine -haloperidol -ibutilide -levomethadyl -mesoridazine -methadone -pentamidine -procainamide -quinidine -pimozide -posaconazole -probucol -propafenone -saquinavir -sotalol -sparfloxacin -terfenadine -thioridazine -troleandomycin -ziprasidone This list may not describe all possible interactions. Give your health care provider a list of all the medicines, herbs, non-prescription drugs, or dietary supplements you use. Also tell them if you smoke, drink alcohol, or use illegal drugs. Some items may interact with your medicine. What should I watch for while using this medicine? This drug may make you feel generally unwell. This is not uncommon, as chemotherapy can affect healthy cells as well as cancer cells. Report any side effects. Continue your course of treatment even though you feel ill unless your doctor tells you to stop. Call your doctor or health care professional for advice if you get a fever, chills or sore throat, or other symptoms of a cold or flu. Do not treat  yourself. This drug decreases your body's ability to  fight infections. Try to avoid being around people who are sick. This medicine may increase your risk to bruise or bleed. Call your doctor or health care professional if you notice any unusual bleeding. You may need blood work done while you are taking this medicine. Do not become pregnant while taking this medicine or for 2 weeks after stopping it. Women should inform their doctor if they wish to become pregnant or think they might be pregnant. Men should not father a child while taking this medicine and for 3.5 months after stopping it. There is a potential for serious side effects to an unborn child. Talk to your health care professional or pharmacist for more information. Do not breast-feed an infant while taking this medicine or for 2 weeks after stopping it. What side effects may I notice from receiving this medicine? Side effects that you should report to your doctor or health care professional as soon as possible: -allergic reactions like skin rash, itching or hives, swelling of the face, lips, or tongue -low blood counts - this medicine may decrease the number of white blood cells, red blood cells and platelets. You may be at increased risk for infections and bleeding. -signs of infection - fever or chills, cough, sore throat, pain or difficulty passing urine -signs of decreased platelets or bleeding - bruising, pinpoint red spots on the skin, black, tarry stools, blood in the urine -signs of decreased red blood cells - unusually weak or tired, fainting spells, lightheadedness -pain, tingling, numbness in the hands or feet Side effects that usually do not require medical attention (report to your doctor or health care professional if they continue or are bothersome): -constipation -hair loss -headache -loss of appetite -muscle or joint pain -nausea, vomiting -stomach pain This list may not describe all possible side effects. Call your doctor for medical advice about side effects. You may  report side effects to FDA at 1-800-FDA-1088. Where should I keep my medicine? This drug is given in a hospital or clinic and will not be stored at home. NOTE: This sheet is a summary. It may not cover all possible information. If you have questions about this medicine, talk to your doctor, pharmacist, or health care provider.  2018 Elsevier/Gold Standard (2015-08-19 10:11:26)

## 2016-12-16 ENCOUNTER — Other Ambulatory Visit: Payer: Self-pay | Admitting: Hematology and Oncology

## 2016-12-16 DIAGNOSIS — R1030 Lower abdominal pain, unspecified: Secondary | ICD-10-CM

## 2016-12-18 ENCOUNTER — Encounter: Payer: Self-pay | Admitting: Gynecologic Oncology

## 2016-12-18 ENCOUNTER — Ambulatory Visit: Payer: Medicare Other | Attending: Gynecologic Oncology | Admitting: Gynecologic Oncology

## 2016-12-18 VITALS — BP 108/72 | HR 103 | Temp 98.4°F | Resp 20 | Ht 62.0 in | Wt 114.5 lb

## 2016-12-18 DIAGNOSIS — Z87891 Personal history of nicotine dependence: Secondary | ICD-10-CM | POA: Diagnosis not present

## 2016-12-18 DIAGNOSIS — N939 Abnormal uterine and vaginal bleeding, unspecified: Secondary | ICD-10-CM | POA: Diagnosis not present

## 2016-12-18 DIAGNOSIS — E43 Unspecified severe protein-calorie malnutrition: Secondary | ICD-10-CM

## 2016-12-18 DIAGNOSIS — Z923 Personal history of irradiation: Secondary | ICD-10-CM | POA: Diagnosis not present

## 2016-12-18 DIAGNOSIS — G8929 Other chronic pain: Secondary | ICD-10-CM | POA: Diagnosis not present

## 2016-12-18 DIAGNOSIS — C799 Secondary malignant neoplasm of unspecified site: Secondary | ICD-10-CM | POA: Diagnosis not present

## 2016-12-18 DIAGNOSIS — F419 Anxiety disorder, unspecified: Secondary | ICD-10-CM | POA: Diagnosis not present

## 2016-12-18 DIAGNOSIS — Z853 Personal history of malignant neoplasm of breast: Secondary | ICD-10-CM | POA: Diagnosis not present

## 2016-12-18 DIAGNOSIS — Z79899 Other long term (current) drug therapy: Secondary | ICD-10-CM | POA: Diagnosis not present

## 2016-12-18 DIAGNOSIS — C541 Malignant neoplasm of endometrium: Secondary | ICD-10-CM

## 2016-12-18 DIAGNOSIS — Z885 Allergy status to narcotic agent status: Secondary | ICD-10-CM | POA: Insufficient documentation

## 2016-12-18 DIAGNOSIS — C50919 Malignant neoplasm of unspecified site of unspecified female breast: Secondary | ICD-10-CM | POA: Diagnosis not present

## 2016-12-18 DIAGNOSIS — C50012 Malignant neoplasm of nipple and areola, left female breast: Secondary | ICD-10-CM

## 2016-12-18 DIAGNOSIS — J449 Chronic obstructive pulmonary disease, unspecified: Secondary | ICD-10-CM | POA: Diagnosis not present

## 2016-12-18 DIAGNOSIS — Z9221 Personal history of antineoplastic chemotherapy: Secondary | ICD-10-CM | POA: Insufficient documentation

## 2016-12-18 DIAGNOSIS — C50011 Malignant neoplasm of nipple and areola, right female breast: Secondary | ICD-10-CM

## 2016-12-18 NOTE — Progress Notes (Signed)
Consult Note: Gyn-Onc  Consult was requested by Dr. Sonny Dandy and Dr Garwin Brothers for the evaluation of Paula Navarro 66 y.o. female  CC:  Chief Complaint  Patient presents with  . Endometrial Cancer    Assessment/Plan:  Ms. Paula Navarro  is a 66 y.o.  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   The prognosis from her breast cancer is poor, and therapeutic intent is palliative. However, her uterine tumor also requires palliation as it is causing bleeding. In order to surgically resect the tumor (perform a hysterectomy) she would likely require a laparotomy for specimen delivery due to the bulky size of the uterus. Unfortunately this would involve a delay in her chemotherapy for approximately 6-8 weeks while she heals.  I have concerns about introducing such a delay right now given that she has only completed her first cycle of this regimen of chemotherapy.  An alternative to surgery to palliate her bleeding would be palliative pelvic RT which can be very effective in controlling bleeding.   After discussion with Dr Sonny Dandy, we will attempt to provide the patient with an additional 2 cycles of Halaven chemotherapy and plan to see her back early in the 3rd cycle to schedule hysterectomy. If she develops signficant progression of her breast cancer in the interim, I would recommend no surgery, but instead consideration of pelvic RT (palliative course) prior to hospice care.  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  The patient has a history of bilatera breast cancer diagnosed in January 2015 and treated with neoadjuvant chemotherapy followed by surgery and adjuvant radiation therapy followed by  anti-estrogen oral therapy. Recurrence (left chest wall, lungs, bone) was diagnosed in November, 2016. From February, 2017 until June, 2017 she received salvage chemotherapy with carboplatin and gemcitabine with improvement in her metastatic sites. In June, 2017 she was noted to have thickened endometrium on imaging. She declined surgical intervention at the time as she was concerned about caring for her ailing mother. Subsequently a scalp mass developed as an additional site of progession and new progression developedi n the lungs and left chest wall.  She received no intervention for many months as she was caring for her mother.  A CT abdo/pelvis on 11/14/16 showed an enlarged uterus (12cm) with bulky intrauterine mass and worsening conglomeration of mediastinal and right hilar nodes. THere was also bulky and necrotic retroperitoneal adenopathy encasing the venacava.  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   She was started on Halaven palliative chemotherapy on 12/08/16.   Interval History: she reports persistent and bothersome vaginal bleeding. The flow is less than a menstrual period. She denies pelvic pain. She has not required blood transfusion.  Current Meds:  Outpatient Encounter Prescriptions as of 12/18/2016  Medication Sig  . B Complex-C (SUPER B COMPLEX PO) Take 1 tablet by mouth daily.  . calcium-vitamin D (OSCAL-500) 500-400 MG-UNIT tablet Take 1 tablet by mouth 2 (two) times daily.  . cephALEXin (KEFLEX) 500 MG capsule Take 1 capsule (500 mg total) by mouth 4 (four) times daily.  Marland Kitchen gabapentin (NEURONTIN) 300 MG capsule TAKE  2 CAPSULES (600 MG TOTAL) BY MOUTH 3 (THREE) TIMES DAILY.  Marland Kitchen lidocaine-prilocaine (EMLA) cream Apply 1 application topically as needed. (Patient taking differently: Apply 1 application topically as  needed (for port access). )  . lidocaine-prilocaine (EMLA) cream Apply to affected area once  . LORazepam (ATIVAN) 1 MG tablet Take 1 tablet (1 mg total) by mouth at bedtime.  Marland Kitchen nystatin (MYCOSTATIN) 100000 UNIT/ML suspension Take 5 mLs (500,000 Units total) by mouth 4 (four) times daily.  . ondansetron (ZOFRAN) 4 MG tablet Take 1 tablet (4 mg total) by mouth every 8 (eight) hours as needed for nausea or vomiting.  . ondansetron (ZOFRAN) 8 MG tablet Take 1 tablet (8 mg total) by mouth 2 (two) times daily as needed (Nausea or vomiting).  Marland Kitchen oxyCODONE (OXY IR/ROXICODONE) 5 MG immediate release tablet Take 1 tablet (5 mg total) by mouth every 6 (six) hours as needed for severe pain.  . polyethylene glycol (MIRALAX / GLYCOLAX) packet Take 17 g by mouth 2 (two) times daily.  . prochlorperazine (COMPAZINE) 10 MG tablet TAKE 1 TABLET EVERY 6 HOURS AS NEEDED NAUEA OR VOMITING  . prochlorperazine (COMPAZINE) 10 MG tablet Take 1 tablet (10 mg total) by mouth every 6 (six) hours as needed (Nausea or vomiting).  . traMADol (ULTRAM) 50 MG tablet Take 1 tablet (50 mg total) by mouth every 6 (six) hours as needed for moderate pain or severe pain.  Marland Kitchen zolpidem (AMBIEN) 10 MG tablet Take 1 tablet (10 mg total) by mouth at bedtime.  . [DISCONTINUED] dicyclomine (BENTYL) 10 MG capsule Take 1 capsule (10 mg total) by mouth 3 (three) times daily as needed for spasms.   No facility-administered encounter medications on file as of 12/18/2016.     Allergy:  Allergies  Allergen Reactions  . Codeine Other (See Comments)    Hot in chest  . Morphine And Related Itching and Rash    Social Hx:   Social History   Social History  . Marital status: Widowed    Spouse name: N/A  . Number of children: N/A  . Years of education: N/A   Occupational History  . Not on file.   Social History Main Topics  . Smoking status: Former Smoker    Packs/day: 1.50    Years: 40.00    Types: Cigarettes    Quit date: 08/01/2012  .  Smokeless tobacco: Never Used  . Alcohol use No  . Drug use: No  . Sexual activity: Not Currently   Other Topics Concern  . Not on file   Social History Narrative  . No narrative on file    Past Surgical Hx:  Past Surgical History:  Procedure Laterality Date  . ASD REPAIR, OSTIUM PRIMUM  1996   dr Roxy Horseman  . AXILLARY LYMPH NODE DISSECTION Bilateral 04/08/2014   Procedure:  BILATERAL AXILLARY LYMPH NODE DISSECTION;  Surgeon: Autumn Messing III, MD;  Location: [place];  Service: General;  Laterality: Bilateral;  . BREAST BIOPSY Bilateral   . BREAST LUMPECTOMY WITH RADIOACTIVE SEED LOCALIZATION Bilateral 04/08/2014   Procedure: BILATERAL  RADIOACTIVE SEED LOCALIZATION LUMPECTOMY ;  Surgeon: Autumn Messing III, MD;  Location: [place];  Service: General;  Laterality: Bilateral;  . CESAREAN SECTION     x3  . CHOLECYSTECTOMY    . open heart surgery    . PORT A CATH REVISION  1/15   put in   . TUBAL LIGATION      Past Medical Hx:  Past Medical History:  Diagnosis Date  . Anemia   . Anxiety   . Atrial septal defect 1996   Surgical repair in 1996  . Breast cancer ([place])   . Chest pain    Admitted to [facility] in 09/2011; refused stress test  . Chronic bronchitis   . Chronic pain   . COPD (chronic obstructive pulmonary disease) ([place])    on xray  . Palpitation    Tachycardia reported by monitor clerk during a symptomatic spell  . Radiation 06/30/14-08/17/14   Bilateral Breast  . Tobacco abuse    60 pack years; 1.5 packs per day  . Wears dentures    top    Past Gynecological History:  C/s x 3 Patient's last menstrual period was 05/20/2011.  Family Hx:  Family History  Problem Relation Age of Onset  . Breast cancer Maternal Aunt     Review of Systems:  Constitutional  Feels well,    ENT Normal appearing ears and nares bilaterally Skin/Breast  + scalp and left chest wall masses Cardiovascular  No chest pain, shortness of breath, or edema   Pulmonary  No cough or wheeze.  Gastro Intestinal  No nausea, vomitting, or diarrhoea. No bright red blood per rectum, no abdominal pain, change in bowel movement, or constipation.  Genito Urinary  No frequency, urgency, dysuria, + bleeding Musculo Skeletal  No myalgia, arthralgia, joint swelling or pain  Neurologic  No weakness, numbness, change in gait,  Psychology  No depression, anxiety, insomnia.   Vitals:  Blood pressure 108/72, pulse (!) 103, temperature 98.4 F (36.9 C), temperature source Oral, resp. rate 20, height '5\' 2"'$  (1.575 m), weight 114 lb 8 oz (51.9 kg), last menstrual period 05/20/2011.  Physical Exam: WD in NAD Neck  Supple NROM, without any enlargements.  Lymph Node Survey + left axillary adenopathy. Cardiovascular  Pulse normal rate, regularity and rhythm. S1 and S2 normal.  Lungs  Clear to auscultation bilateraly, without wheezes/crackles/rhonchi. Good air movement.  Skin  + 15cm left anterior upper chest wall mass pointing (almost erupting through) to the skin with induration spreading down the chest wall. Psychiatry  Alert and oriented to person, place, and time  Abdomen  Normoactive bowel sounds, abdomen soft, non-tender and nonobese without evidence of hernia. Vertical midline and subcostal incisions. Back No CVA tenderness Genito Urinary  Vulva/vagina: Normal external female genitalia.  No lesions. No discharge or bleeding.  Bladder/urethra:  No lesions or masses, well supported bladder  Vagina: normal  Cervix: Normal appearing, no lesions.  Uterus: mobile, bulky at 12cm but with no parametrial involvement or nodularity.  Adnexa: no discrete masses. Rectal  deferred  Extremities  No bilateral cyanosis, clubbing or edema.   [name], MD  12/18/2016, 5:45 PM

## 2016-12-18 NOTE — Patient Instructions (Signed)
Dr. Denman George will call you with the treatment plan after she reviews your case with Dr. Lindi Adie.

## 2016-12-20 ENCOUNTER — Ambulatory Visit: Payer: Medicare Other | Admitting: Gastroenterology

## 2016-12-20 ENCOUNTER — Encounter: Payer: Self-pay | Admitting: Gastroenterology

## 2016-12-20 ENCOUNTER — Telehealth: Payer: Self-pay | Admitting: Gastroenterology

## 2016-12-20 NOTE — Telephone Encounter (Signed)
PATIENT WAS A NO SHOW AND LETTER SENT  °

## 2016-12-21 ENCOUNTER — Other Ambulatory Visit: Payer: Self-pay

## 2016-12-21 DIAGNOSIS — C50012 Malignant neoplasm of nipple and areola, left female breast: Principal | ICD-10-CM

## 2016-12-21 DIAGNOSIS — C50919 Malignant neoplasm of unspecified site of unspecified female breast: Secondary | ICD-10-CM

## 2016-12-21 DIAGNOSIS — C7951 Secondary malignant neoplasm of bone: Secondary | ICD-10-CM

## 2016-12-21 DIAGNOSIS — C7801 Secondary malignant neoplasm of right lung: Secondary | ICD-10-CM

## 2016-12-21 DIAGNOSIS — C541 Malignant neoplasm of endometrium: Secondary | ICD-10-CM

## 2016-12-21 DIAGNOSIS — C50011 Malignant neoplasm of nipple and areola, right female breast: Secondary | ICD-10-CM

## 2016-12-21 DIAGNOSIS — C770 Secondary and unspecified malignant neoplasm of lymph nodes of head, face and neck: Secondary | ICD-10-CM

## 2016-12-21 NOTE — Progress Notes (Signed)
Pt called in states that she needs her wound dressing change. She states that there is some odor coming from her dressing. Would like to have someone come to evaluate and change her dressing. Will consult home care to have someone come out. Pt verbalized understanding. Will follow up with pt.

## 2016-12-22 ENCOUNTER — Telehealth: Payer: Self-pay

## 2016-12-22 NOTE — Telephone Encounter (Signed)
Called to notify pt that a referral to [facility] was done. Faxed orders for home health wound care services. Will follow up with pt this afternoon to see if someone will be coming out to her home soon. Pt verbalized understanding and appreciative of help.

## 2016-12-26 ENCOUNTER — Telehealth: Payer: Self-pay

## 2016-12-26 NOTE — Telephone Encounter (Signed)
I had faxed the orders to [facility] twice. I have confirmation for both.

## 2016-12-26 NOTE — Telephone Encounter (Signed)
Pt lvm on Memorial Day that she called [facility] and they say nobody called from [facility] to give them orders.  Called pt back to let her know we got the message and it will be forwarded to Dr Geralyn Flash nurse.

## 2016-12-29 ENCOUNTER — Ambulatory Visit: Payer: Medicare Other

## 2016-12-29 ENCOUNTER — Other Ambulatory Visit (HOSPITAL_BASED_OUTPATIENT_CLINIC_OR_DEPARTMENT_OTHER): Payer: Medicare Other

## 2016-12-29 ENCOUNTER — Encounter: Payer: Self-pay | Admitting: Adult Health

## 2016-12-29 ENCOUNTER — Ambulatory Visit (HOSPITAL_BASED_OUTPATIENT_CLINIC_OR_DEPARTMENT_OTHER): Payer: Medicare Other | Admitting: Adult Health

## 2016-12-29 ENCOUNTER — Other Ambulatory Visit: Payer: Medicare Other

## 2016-12-29 ENCOUNTER — Ambulatory Visit (HOSPITAL_BASED_OUTPATIENT_CLINIC_OR_DEPARTMENT_OTHER): Payer: Medicare Other

## 2016-12-29 VITALS — BP 126/69 | HR 82 | Temp 98.4°F | Resp 18 | Ht 62.0 in | Wt 118.9 lb

## 2016-12-29 VITALS — BP 125/73 | HR 81 | Temp 98.1°F | Resp 18

## 2016-12-29 DIAGNOSIS — C7951 Secondary malignant neoplasm of bone: Secondary | ICD-10-CM | POA: Diagnosis not present

## 2016-12-29 DIAGNOSIS — C7800 Secondary malignant neoplasm of unspecified lung: Secondary | ICD-10-CM | POA: Diagnosis not present

## 2016-12-29 DIAGNOSIS — C50911 Malignant neoplasm of unspecified site of right female breast: Secondary | ICD-10-CM

## 2016-12-29 DIAGNOSIS — C50011 Malignant neoplasm of nipple and areola, right female breast: Secondary | ICD-10-CM

## 2016-12-29 DIAGNOSIS — C50012 Malignant neoplasm of nipple and areola, left female breast: Secondary | ICD-10-CM

## 2016-12-29 DIAGNOSIS — C50919 Malignant neoplasm of unspecified site of unspecified female breast: Secondary | ICD-10-CM

## 2016-12-29 DIAGNOSIS — C7989 Secondary malignant neoplasm of other specified sites: Secondary | ICD-10-CM

## 2016-12-29 DIAGNOSIS — E860 Dehydration: Secondary | ICD-10-CM | POA: Diagnosis not present

## 2016-12-29 DIAGNOSIS — Z170 Estrogen receptor positive status [ER+]: Secondary | ICD-10-CM

## 2016-12-29 DIAGNOSIS — C50912 Malignant neoplasm of unspecified site of left female breast: Secondary | ICD-10-CM

## 2016-12-29 DIAGNOSIS — C770 Secondary and unspecified malignant neoplasm of lymph nodes of head, face and neck: Secondary | ICD-10-CM

## 2016-12-29 LAB — COMPREHENSIVE METABOLIC PANEL
ALT: 21 U/L (ref 0–55)
ANION GAP: 8 meq/L (ref 3–11)
AST: 58 U/L — ABNORMAL HIGH (ref 5–34)
Albumin: 2.6 g/dL — ABNORMAL LOW (ref 3.5–5.0)
Alkaline Phosphatase: 66 U/L (ref 40–150)
BUN: 9.4 mg/dL (ref 7.0–26.0)
CHLORIDE: 106 meq/L (ref 98–109)
CO2: 26 meq/L (ref 22–29)
Calcium: 8.7 mg/dL (ref 8.4–10.4)
Creatinine: 0.7 mg/dL (ref 0.6–1.1)
EGFR: >90 mL/min/{1.73_m2} (ref >90)
Glucose: 117 mg/dl (ref 70–140)
Potassium: 3.6 mEq/L (ref 3.5–5.1)
SODIUM: 140 meq/L (ref 136–145)
Total Bilirubin: 0.52 mg/dL (ref 0.20–1.20)
Total Protein: 7 g/dL (ref 6.4–8.3)

## 2016-12-29 LAB — CBC WITH DIFFERENTIAL/PLATELET
BASO%: 0.6 % (ref 0.0–2.0)
Basophils Absolute: 0 10*3/uL (ref 0.0–0.1)
EOS%: 0.1 % (ref 0.0–7.0)
Eosinophils Absolute: 0 10*3/uL (ref 0.0–0.5)
HCT: 28.6 % — ABNORMAL LOW (ref 34.8–46.6)
HGB: 8.9 g/dL — ABNORMAL LOW (ref 11.6–15.9)
LYMPH%: 33.9 % (ref 14.0–49.7)
MCH: 21.3 pg — ABNORMAL LOW (ref 25.1–34.0)
MCHC: 31.2 g/dL — AB (ref 31.5–36.0)
MCV: 68.3 fL — ABNORMAL LOW (ref 79.5–101.0)
MONO#: 0.9 10*3/uL (ref 0.1–0.9)
MONO%: 15.7 % — AB (ref 0.0–14.0)
NEUT#: 2.7 10*3/uL (ref 1.5–6.5)
NEUT%: 49.7 % (ref 38.4–76.8)
PLATELETS: 315 10*3/uL (ref 145–400)
RBC: 4.19 10*6/uL (ref 3.70–5.45)
RDW: 24.1 % — ABNORMAL HIGH (ref 11.2–14.5)
WBC: 5.5 10*3/uL (ref 3.9–10.3)
lymph#: 1.9 10*3/uL (ref 0.9–3.3)

## 2016-12-29 MED ORDER — HEPARIN SOD (PORK) LOCK FLUSH 100 UNIT/ML IV SOLN
500.0000 [IU] | Freq: Once | INTRAVENOUS | Status: AC
  Administered 2016-12-29: 500 [IU] via INTRAVENOUS
  Filled 2016-12-29: qty 5

## 2016-12-29 MED ORDER — SODIUM CHLORIDE 0.9 % IV SOLN: INTRAVENOUS | Status: DC

## 2016-12-29 MED ORDER — SODIUM CHLORIDE 0.9% FLUSH
10.0000 mL | INTRAVENOUS | Status: DC | PRN
  Administered 2016-12-29: 10 mL via INTRAVENOUS
  Filled 2016-12-29: qty 10

## 2016-12-29 MED ORDER — SODIUM CHLORIDE 0.9 % IV SOLN
INTRAVENOUS | Status: AC
  Administered 2016-12-29: 12:00:00 via INTRAVENOUS

## 2016-12-29 MED ORDER — PROCHLORPERAZINE MALEATE 10 MG PO TABS
ORAL_TABLET | ORAL | Status: AC
  Filled 2016-12-29: qty 1

## 2016-12-29 NOTE — Progress Notes (Signed)
[facility] Follow up:    [name], MD (Inactive) No address on file   DIAGNOSIS: Cancer Staging Bilateral breast cancer [facility]) Staging form: Breast, AJCC 7th Edition - Clinical: Stage IIB (T2, N1, cM0) - Unsigned Staging comments: Staged at breast conference 08/13/13  - Pathologic: No stage assigned - Unsigned   SUMMARY OF ONCOLOGIC HISTORY:   Bilateral breast cancer ([place])   07/23/2013 Mammogram    Bilateral breast masses. 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 N1 M0 stage IIB ER 100%, PR 0%, HER-2       06/17/2014 -  Radiation Therapy    Adjuvant radiation therapy      09/14/2014 -  Anti-estrogen oral therapy    Anastrozole 1 mg daily      05/28/2015 Imaging    CT scans: Enlarging subpectoral masses 3.1 x 3.5 cm, posteriorly lower density mass 4.5 x 2.1 cm, several right-sided lung nodules right lower lobe 1.4 cm, 3 other right lung nodules, 1.7 cm right iliac bone lesion      06/21/2015 Procedure    Left subpectoral mass biopsy: Invasive high-grade ductal carcinoma ER 5%, PR 0%, HER-2 negative ratio 1.29      09/01/2015 Imaging    Left chest wall mass increased in size 7.2 x 5.1 cm, multiple subcutaneous nodules, increase in the lung nodules both in number as well as in the size of existing nodules      09/10/2015 -  Chemotherapy    Carboplatin, gemcitabine days 1 and 8 q 3 weeks, carboplatin discontinued for neuropathy (treatment break from 01/20/2016 to 03/24/2016); Added Carboplatin back 04/14/16 (for progression)      01/26/2016 Imaging    Marked improvement in size of lung nodules with many of the nodules resolved index right lower lobe nodule 1.9 x 1.8 cm is now 0.7 x 0.6 cm, reduction in the size of left breast mass 7.2 cm down to 2.6 cm, enlargement in the hypodense mass in the uterus      04/06/2016 Imaging    CT chest: Interval progression of pre-existing lung nodules new lung metastases (32m, 1102m 2.9 cm, 1.6 cm), interval progression of disease in the left breast 4.2 cm (was 2.6 cm), additional nodules 2.3 cm and 3.2 cm; CT head: scalp mass 1.9 cm      06/27/2016 Imaging    Ct chest: Stable lesions in lt breast  deep to Left pectoralis, cystic lesion loc ant in left breast inc compared to previous; decrease in lung nodules (80m to 3 mm; 12 mm to 7 mm, RML nodule 2.9 cm to 1.8 cm, RLL 137mto 7 mm)      11/14/2016 Imaging    CT CAP:RP and retrocrural lymphadenopathy, large uterine fibroids with necrosis; MRI Abd: RP lymphadenopathy CT chest 11/20/16: Worsening bulky conglomeration of mediastinal and right hilar lymph nodes, lung nodules subcentimeter size slight increase, left breast mass was 3.7 x 5.8 cm now 5.4 x 7.4 cm      12/08/2016 -  Chemotherapy    Palliative chemotherapy with Halaven days 1 and 8 every 3 weeks        CURRENT THERAPY: Halaven days 1 and 8 every 3 weeks  INTERVAL HISTORY: Paula Spinner89.o. female returns for evaluation  prior to receiving Halaven today.  She has had increased pain and bleeding from her subcutaneous tumor on her scalp.  She says that it is painful.  She went to ER on 5/14 and they applied a bandage.  She hasn't had any other follow up about it.  She says she feels weak and tired and doesn't think that she can get chemotherapy today.  Her neuropathy is worse and now it is in her entire palm of her hand and soles of her feet.  She is having motor difficulties with buttoning her blouse.  She has experienced nausea and vomiting this past week and just wants to sleep and slowly recover.     Patient Active Problem List   Diagnosis Date Noted  . Endometrial cancer ([facility]05/21/2018  . Protein-calorie malnutrition, severe 11/15/2016  . UTI (urinary tract infection) 11/14/2016  . COPD (chronic obstructive pulmonary disease) ([facility]CO[place]  . Metastasis to supraclavicular lymph node (HCWync[place]11/19/2017  . Chemotherapy-induced thrombocytopenia 06/13/2016  . Scalp lesion 04/01/2016  . Encounter for central line care 12/08/2015  . Port catheter in place 11/19/2015  . Insomnia 11/01/2015  . Microcytic anemia 09/28/2015  . Lung metastases (HCD[place]02/04/2016  . Bone metastases (HCBlowing [place]12/20/2016  . Goals of care, counseling/discussion 07/13/2015  . Metastatic breast cancer (HCHebron Estates11/29/2016  . Chronic pain 03/16/2015  . Vaginal bleeding 09/24/2014  . Postherpetic neuralgia 09/03/2014  . Lymphedema 09/03/2014  . Suspected herpes zoster left C5 distribution 08/14/2014  . Neuropathy due to chemotherapeutic drug (HCUniontown08/10/2013  . Anxiety 03/03/2014  . Bilateral breast cancer ([facility]CEast[place]01/06/2014  . Palpitation   . Chest pain   . Atrial septal defect   . Laboratory test 11/25/2011  . Chronic bronchitis   . Tobacco abuse     is allergic to codeine and morphine and related.  MEDICAL HISTORY: Past Medical History:  Diagnosis Date  . Anemia   . Anxiety   . Atrial septal defect 1996   Surgical repair in 1996  .  Breast cancer (HCMal[place]  . Chest pain    Admitted to [facility] in 09/2011; refused stress test  . Chronic bronchitis   . Chronic pain   . COPD (chronic obstructive pulmonary disease) (HCHonomu   on xray  . Palpitation    Tachycardia reported by monitor clerk during a symptomatic spell  . Radiation 06/30/14-08/17/14   Bilateral Breast  . Tobacco abuse    60 pack years; 1.5 packs per day  . Wears dentures    top    SURGICAL HISTORY: Past Surgical History:  Procedure Laterality Date  . ASD REPAIR, OSTIUM PRIMUM  1996   dr Roxy Horseman  . AXILLARY LYMPH NODE DISSECTION Bilateral 04/08/2014   Procedure:  BILATERAL AXILLARY LYMPH NODE DISSECTION;  Surgeon: Autumn Messing III, MD;  Location: [place];  Service: General;  Laterality: Bilateral;  . BREAST BIOPSY Bilateral   . BREAST LUMPECTOMY WITH RADIOACTIVE SEED LOCALIZATION Bilateral 04/08/2014   Procedure: BILATERAL  RADIOACTIVE SEED LOCALIZATION LUMPECTOMY ;  Surgeon: Autumn Messing III, MD;  Location: [place];  Service: General;  Laterality: Bilateral;  . CESAREAN SECTION     x3  . CHOLECYSTECTOMY    . open heart surgery    . PORT A CATH REVISION  1/15   put in   . TUBAL LIGATION      SOCIAL HISTORY: Social History   Social History  . Marital status: Widowed    Spouse name: N/A  . Number of children: N/A  . Years of education: N/A   Occupational History  . Not on file.   Social History Main Topics  . Smoking status: Former Smoker    Packs/day: 1.50    Years: 40.00    Types: Cigarettes    Quit date: 08/01/2012  . Smokeless tobacco: Never Used  . Alcohol use No  . Drug use: No  . Sexual activity: Not Currently   Other Topics Concern  . Not on file   Social History Narrative  . No narrative on file    FAMILY HISTORY: Family History  Problem Relation Age of Onset  . Breast cancer Maternal Aunt     Review of Systems  Constitutional: Positive for appetite change, fatigue and unexpected weight change.  Negative for chills, diaphoresis and fever.  HENT:   Negative for hearing loss and lump/mass.   Eyes: Negative for eye problems.  Respiratory: Negative for chest tightness, cough and shortness of breath.   Cardiovascular: Negative for chest pain, leg swelling and palpitations.  Gastrointestinal: Positive for nausea and vomiting. Negative for abdominal distention, abdominal pain, constipation and diarrhea.  Endocrine: Negative for hot flashes.  Genitourinary: Negative for difficulty urinating.   Musculoskeletal: Negative for arthralgias.  Skin: Negative for itching and rash.  Neurological: Positive for numbness. Negative for dizziness and extremity weakness.  Hematological: Positive for adenopathy. Does not bruise/bleed easily.  Psychiatric/Behavioral: Negative for depression. The patient is not nervous/anxious.       PHYSICAL EXAMINATION  ECOG PERFORMANCE STATUS: 3 - Symptomatic, >50% confined to bed  Vitals:   12/29/16 1016  BP: 126/69  Pulse: 82  Resp: 18  Temp: 98.4 F (36.9 C)    Physical Exam  Constitutional: She is oriented to person, place, and time.  Appears tired, lying on exam table with blanket, appears chronically ill  HENT:  Head: Normocephalic and atraumatic.  Mouth/Throat: No oropharyngeal exudate.  Eyes: No scleral icterus.  Cardiovascular: Normal rate, regular rhythm and normal heart sounds.   Pulmonary/Chest: Effort normal and breath sounds normal.  Abdominal: Soft. Bowel sounds are normal.  Musculoskeletal: She exhibits no edema.  Neurological: She is alert and oriented to person, place, and time.  Skin:  Scalp with lesion see picture below  Psychiatric: Mood and affect normal.    Lesion above from 12/29/2016          LABORATORY DATA:  CBC    Component Value Date/Time   WBC 5.5 12/29/2016 0939   WBC 12.3 (H) 11/20/2016 2100   RBC 4.19 12/29/2016 0939   RBC 3.48 (L) 11/20/2016 2100   HGB 8.9 (L) 12/29/2016 7846  HCT 28.6 (L)  12/29/2016 0939   PLT 315 12/29/2016 0939   MCV 68.3 (L) 12/29/2016 0939   MCH 21.3 (L) 12/29/2016 0939   MCH 22.4 (L) 11/20/2016 2100   MCHC 31.2 (L) 12/29/2016 0939   MCHC 31.5 11/20/2016 2100   RDW 24.1 (H) 12/29/2016 0939   LYMPHSABS 1.9 12/29/2016 0939   MONOABS 0.9 12/29/2016 0939   EOSABS 0.0 12/29/2016 0939   BASOSABS 0.0 12/29/2016 0939    CMP     Component Value Date/Time   NA 140 12/29/2016 0939   K 3.6 12/29/2016 0939   CL 99 (L) 11/20/2016 2100   CO2 26 12/29/2016 0939   GLUCOSE 117 12/29/2016 0939   BUN 9.4 12/29/2016 0939   CREATININE 0.7 12/29/2016 0939   CALCIUM 8.7 12/29/2016 0939   PROT 7.0 12/29/2016 0939   ALBUMIN 2.6 (L) 12/29/2016 0939   AST 58 (H) 12/29/2016 0939   ALT 21 12/29/2016 0939   ALKPHOS 66 12/29/2016 0939   BILITOT 0.52 12/29/2016 0939   GFRNONAA >60 11/20/2016 2100   GFRAA >60 11/20/2016 2100           ASSESSMENT and PLAN:   Bilateral breast cancer ([place]) Bilateral breast cancers are usually diagnosed 2015 treated with surgery, radiation, anastrozole Metastatic breast cancer diagnosed 05/28/2015 as subpectoral mass, ER 5%, PR 0%, HER-2 negative, failed Xeloda and carboplatin and gemcitabine  CT CAP04/17/2017:RP and retrocrural lymphadenopathy, large uterine fibroids with necrosis;  MRI Abd: RP lymphadenopathy CT chest 11/20/16: Worsening bulky conglomeration of mediastinal and right hilar lymph nodes, lung nodules subcentimeter size slight increase, left breast mass was 3.7 x 5.8 cm now 5.4 x 7.4 cm  Current treatment: 1. Halaven days 1 and 8 every 3 weeks Holding Halaven today due to peripheral neuropathy.  She will receive one liter of iv fluids for dehydration.  We dressed the wound on her scalp with vaseline gauze covered with gauze.  I will send picture to Dr. Marlou Starks and request f/u.  It sounds like she missed her scheduled appointment with him.    Scalp lesion: Will follow up with Dr. Marlou Starks about procedure.   All  questions were answered. The patient knows to call the clinic with any problems, questions or concerns. We can certainly see the patient much sooner if necessary.  A total of (30) minutes of face-to-face time was spent with this patient with greater than 50% of that time in counseling and care-coordination.  This note was electronically signed. Scot Dock, NP 12/29/2016

## 2016-12-29 NOTE — Patient Instructions (Signed)
Dehydration, Adult Dehydration is a condition in which there is not enough fluid or water in the body. This happens when you lose more fluids than you take in. Important organs, such as the kidneys, brain, and heart, cannot function without a proper amount of fluids. Any loss of fluids from the body can lead to dehydration. Dehydration can range from mild to severe. This condition should be treated right away to prevent it from becoming severe. What are the causes? This condition may be caused by:  Vomiting.  Diarrhea.  Excessive sweating, such as from heat exposure or exercise.  Not drinking enough fluid, especially: ? When ill. ? While doing activity that requires a lot of energy.  Excessive urination.  Fever.  Infection.  Certain medicines, such as medicines that cause the body to lose excess fluid (diuretics).  Inability to access safe drinking water.  Reduced physical ability to get adequate water and food.  What increases the risk? This condition is more likely to develop in people:  Who have a poorly controlled long-term (chronic) illness, such as diabetes, heart disease, or kidney disease.  Who are age 65 or older.  Who are disabled.  Who live in a place with high altitude.  Who play endurance sports.  What are the signs or symptoms? Symptoms of mild dehydration may include:  Thirst.  Dry lips.  Slightly dry mouth.  Dry, warm skin.  Dizziness. Symptoms of moderate dehydration may include:  Very dry mouth.  Muscle cramps.  Dark urine. Urine may be the color of tea.  Decreased urine production.  Decreased tear production.  Heartbeat that is irregular or faster than normal (palpitations).  Headache.  Light-headedness, especially when you stand up from a sitting position.  Fainting (syncope). Symptoms of severe dehydration may include:  Changes in skin, such as: ? Cold and clammy skin. ? Blotchy (mottled) or pale skin. ? Skin that does  not quickly return to normal after being lightly pinched and released (poor skin turgor).  Changes in body fluids, such as: ? Extreme thirst. ? No tear production. ? Inability to sweat when body temperature is high, such as in hot weather. ? Very little urine production.  Changes in vital signs, such as: ? Weak pulse. ? Pulse that is more than 100 beats a minute when sitting still. ? Rapid breathing. ? Low blood pressure.  Other changes, such as: ? Sunken eyes. ? Cold hands and feet. ? Confusion. ? Lack of energy (lethargy). ? Difficulty waking up from sleep. ? Short-term weight loss. ? Unconsciousness. How is this diagnosed? This condition is diagnosed based on your symptoms and a physical exam. Blood and urine tests may be done to help confirm the diagnosis. How is this treated? Treatment for this condition depends on the severity. Mild or moderate dehydration can often be treated at home. Treatment should be started right away. Do not wait until dehydration becomes severe. Severe dehydration is an emergency and it needs to be treated in a hospital. Treatment for mild dehydration may include:  Drinking more fluids.  Replacing salts and minerals in your blood (electrolytes) that you may have lost. Treatment for moderate dehydration may include:  Drinking an oral rehydration solution (ORS). This is a drink that helps you replace fluids and electrolytes (rehydrate). It can be found at pharmacies and retail stores. Treatment for severe dehydration may include:  Receiving fluids through an IV tube.  Receiving an electrolyte solution through a feeding tube that is passed through your nose   and into your stomach (nasogastric tube, or NG tube).  Correcting any abnormalities in electrolytes.  Treating the underlying cause of dehydration. Follow these instructions at home:  If directed by your health care provider, drink an ORS: ? Make an ORS by following instructions on the  package. ? Start by drinking small amounts, about  cup (120 mL) every 5-10 minutes. ? Slowly increase how much you drink until you have taken the amount recommended by your health care provider.  Drink enough clear fluid to keep your urine clear or pale yellow. If you were told to drink an ORS, finish the ORS first, then start slowly drinking other clear fluids. Drink fluids such as: ? Water. Do not drink only water. Doing that can lead to having too little salt (sodium) in the body (hyponatremia). ? Ice chips. ? Fruit juice that you have added water to (diluted fruit juice). ? Low-calorie sports drinks.  Avoid: ? Alcohol. ? Drinks that contain a lot of sugar. These include high-calorie sports drinks, fruit juice that is not diluted, and soda. ? Caffeine. ? Foods that are greasy or contain a lot of fat or sugar.  Take over-the-counter and prescription medicines only as told by your health care provider.  Do not take sodium tablets. This can lead to having too much sodium in the body (hypernatremia).  Eat foods that contain a healthy balance of electrolytes, such as bananas, oranges, potatoes, tomatoes, and spinach.  Keep all follow-up visits as told by your health care provider. This is important. Contact a health care provider if:  You have abdominal pain that: ? Gets worse. ? Stays in one area (localizes).  You have a rash.  You have a stiff neck.  You are more irritable than usual.  You are sleepier or more difficult to wake up than usual.  You feel weak or dizzy.  You feel very thirsty.  You have urinated only a small amount of very dark urine over 6-8 hours. Get help right away if:  You have symptoms of severe dehydration.  You cannot drink fluids without vomiting.  Your symptoms get worse with treatment.  You have a fever.  You have a severe headache.  You have vomiting or diarrhea that: ? Gets worse. ? Does not go away.  You have blood or green matter  (bile) in your vomit.  You have blood in your stool. This may cause stool to look black and tarry.  You have not urinated in 6-8 hours.  You faint.  Your heart rate while sitting still is over 100 beats a minute.  You have trouble breathing. This information is not intended to replace advice given to you by your health care provider. Make sure you discuss any questions you have with your health care provider. Document Released: 07/17/2005 Document Revised: 02/11/2016 Document Reviewed: 09/10/2015 Elsevier Interactive Patient Education  2018 [employer]  

## 2016-12-29 NOTE — Patient Instructions (Signed)

## 2016-12-29 NOTE — Pre-Procedure Instructions (Signed)
Scalp lesion 12/29/16

## 2016-12-29 NOTE — Assessment & Plan Note (Signed)
Bilateral breast cancers are usually diagnosed 2015 treated with surgery, radiation, anastrozole Metastatic breast cancer diagnosed 05/28/2015 as subpectoral mass, ER 5%, PR 0%, HER-2 negative, failed Xeloda and carboplatin and gemcitabine  CT CAP04/17/2017:RP and retrocrural lymphadenopathy, large uterine fibroids with necrosis;  MRI Abd: RP lymphadenopathy CT chest 11/20/16: Worsening bulky conglomeration of mediastinal and right hilar lymph nodes, lung nodules subcentimeter size slight increase, left breast mass was 3.7 x 5.8 cm now 5.4 x 7.4 cm  Current treatment: 1. Halaven days 1 and 8 every 3 weeks Holding Halaven today due to peripheral neuropathy.  She will receive one liter of iv fluids for dehydration.  We dressed the wound on her scalp with vaseline gauze covered with gauze.  I will send picture to Dr. Marlou Starks and request f/u.  It sounds like she missed her scheduled appointment with him.    Scalp lesion: Will follow up with Dr. Marlou Starks about procedure.

## 2016-12-31 ENCOUNTER — Other Ambulatory Visit: Payer: Self-pay | Admitting: Hematology and Oncology

## 2017-01-04 ENCOUNTER — Other Ambulatory Visit: Payer: Self-pay

## 2017-01-05 ENCOUNTER — Ambulatory Visit (HOSPITAL_BASED_OUTPATIENT_CLINIC_OR_DEPARTMENT_OTHER): Payer: Medicare Other

## 2017-01-05 ENCOUNTER — Encounter: Payer: Self-pay | Admitting: Hematology and Oncology

## 2017-01-05 ENCOUNTER — Ambulatory Visit (HOSPITAL_BASED_OUTPATIENT_CLINIC_OR_DEPARTMENT_OTHER): Payer: Medicare Other | Admitting: Hematology and Oncology

## 2017-01-05 ENCOUNTER — Ambulatory Visit: Payer: Medicare Other

## 2017-01-05 ENCOUNTER — Other Ambulatory Visit (HOSPITAL_BASED_OUTPATIENT_CLINIC_OR_DEPARTMENT_OTHER): Payer: Medicare Other

## 2017-01-05 VITALS — BP 141/76 | HR 87 | Temp 99.1°F | Resp 18 | Ht 62.0 in | Wt 116.5 lb

## 2017-01-05 VITALS — BP 146/78 | HR 86

## 2017-01-05 DIAGNOSIS — C50011 Malignant neoplasm of nipple and areola, right female breast: Secondary | ICD-10-CM

## 2017-01-05 DIAGNOSIS — C50012 Malignant neoplasm of nipple and areola, left female breast: Secondary | ICD-10-CM | POA: Diagnosis not present

## 2017-01-05 DIAGNOSIS — C50912 Malignant neoplasm of unspecified site of left female breast: Secondary | ICD-10-CM

## 2017-01-05 DIAGNOSIS — C50919 Malignant neoplasm of unspecified site of unspecified female breast: Secondary | ICD-10-CM

## 2017-01-05 DIAGNOSIS — C50911 Malignant neoplasm of unspecified site of right female breast: Secondary | ICD-10-CM

## 2017-01-05 DIAGNOSIS — Z170 Estrogen receptor positive status [ER+]: Secondary | ICD-10-CM

## 2017-01-05 DIAGNOSIS — C7951 Secondary malignant neoplasm of bone: Secondary | ICD-10-CM | POA: Diagnosis not present

## 2017-01-05 DIAGNOSIS — C541 Malignant neoplasm of endometrium: Secondary | ICD-10-CM

## 2017-01-05 DIAGNOSIS — C770 Secondary and unspecified malignant neoplasm of lymph nodes of head, face and neck: Secondary | ICD-10-CM

## 2017-01-05 LAB — CBC WITH DIFFERENTIAL/PLATELET
BASO%: 0.3 % (ref 0.0–2.0)
BASOS ABS: 0 10*3/uL (ref 0.0–0.1)
EOS%: 1.4 % (ref 0.0–7.0)
Eosinophils Absolute: 0.1 10*3/uL (ref 0.0–0.5)
HEMATOCRIT: 31.5 % — AB (ref 34.8–46.6)
HGB: 9.8 g/dL — ABNORMAL LOW (ref 11.6–15.9)
LYMPH#: 2.7 10*3/uL (ref 0.9–3.3)
LYMPH%: 27.7 % (ref 14.0–49.7)
MCH: 21.3 pg — AB (ref 25.1–34.0)
MCHC: 31.1 g/dL — ABNORMAL LOW (ref 31.5–36.0)
MCV: 68.3 fL — ABNORMAL LOW (ref 79.5–101.0)
MONO#: 1 10*3/uL — AB (ref 0.1–0.9)
MONO%: 10.6 % (ref 0.0–14.0)
NEUT#: 5.9 10*3/uL (ref 1.5–6.5)
NEUT%: 60 % (ref 38.4–76.8)
PLATELETS: 286 10*3/uL (ref 145–400)
RBC: 4.61 10*6/uL (ref 3.70–5.45)
RDW: 23.2 % — ABNORMAL HIGH (ref 11.2–14.5)
WBC: 9.8 10*3/uL (ref 3.9–10.3)
nRBC: 0 % (ref 0–0)

## 2017-01-05 LAB — COMPREHENSIVE METABOLIC PANEL
ALBUMIN: 2.9 g/dL — AB (ref 3.5–5.0)
ALK PHOS: 67 U/L (ref 40–150)
ALT: 20 U/L (ref 0–55)
AST: 56 U/L — AB (ref 5–34)
Anion Gap: 8 mEq/L (ref 3–11)
BUN: 10.2 mg/dL (ref 7.0–26.0)
CALCIUM: 9.6 mg/dL (ref 8.4–10.4)
CO2: 26 mEq/L (ref 22–29)
CREATININE: 0.7 mg/dL (ref 0.6–1.1)
Chloride: 102 mEq/L (ref 98–109)
EGFR: >90 mL/min/{1.73_m2} (ref >90)
Glucose: 104 mg/dl (ref 70–140)
POTASSIUM: 4.3 meq/L (ref 3.5–5.1)
Sodium: 136 mEq/L (ref 136–145)
Total Bilirubin: 0.48 mg/dL (ref 0.20–1.20)
Total Protein: 7.8 g/dL (ref 6.4–8.3)

## 2017-01-05 MED ORDER — PROCHLORPERAZINE MALEATE 10 MG PO TABS
ORAL_TABLET | ORAL | Status: AC
  Filled 2017-01-05: qty 1

## 2017-01-05 MED ORDER — SODIUM CHLORIDE 0.9% FLUSH
10.0000 mL | INTRAVENOUS | Status: DC | PRN
  Administered 2017-01-05: 10 mL
  Filled 2017-01-05: qty 10

## 2017-01-05 MED ORDER — SODIUM CHLORIDE 0.9% FLUSH
10.0000 mL | INTRAVENOUS | Status: DC | PRN
  Filled 2017-01-05: qty 10

## 2017-01-05 MED ORDER — HEPARIN SOD (PORK) LOCK FLUSH 100 UNIT/ML IV SOLN
500.0000 [IU] | Freq: Once | INTRAVENOUS | Status: AC | PRN
  Administered 2017-01-05: 500 [IU]
  Filled 2017-01-05: qty 5

## 2017-01-05 MED ORDER — PROCHLORPERAZINE MALEATE 10 MG PO TABS
10.0000 mg | ORAL_TABLET | Freq: Once | ORAL | Status: AC
  Administered 2017-01-05: 10 mg via ORAL

## 2017-01-05 MED ORDER — SODIUM CHLORIDE 0.9 % IV SOLN
Freq: Once | INTRAVENOUS | Status: AC
  Administered 2017-01-05: 15:00:00 via INTRAVENOUS

## 2017-01-05 MED ORDER — SODIUM CHLORIDE 0.9 % IV SOLN
1.3000 mg/m2 | Freq: Once | INTRAVENOUS | Status: AC
  Administered 2017-01-05: 2 mg via INTRAVENOUS
  Filled 2017-01-05: qty 4

## 2017-01-05 MED ORDER — HEPARIN SOD (PORK) LOCK FLUSH 100 UNIT/ML IV SOLN
500.0000 [IU] | Freq: Once | INTRAVENOUS | Status: DC | PRN
  Filled 2017-01-05: qty 5

## 2017-01-05 NOTE — Progress Notes (Signed)
Patient Care Team: [name], MD (Inactive) as PCP - General (Family Medicine) Rothbart, Cristopher Estimable, MD (Cardiology) [name], MD as Consulting Physician (Gastroenterology)  DIAGNOSIS:  Encounter Diagnosis  Name Primary?  . 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 N1 M0 stage IIB ER 100%, PR 0%, HER-2       06/17/2014 -  Radiation Therapy    Adjuvant radiation therapy      09/14/2014 -  Anti-estrogen oral therapy    Anastrozole 1 mg daily      05/28/2015 Imaging    CT scans: Enlarging subpectoral masses 3.1 x 3.5 cm, posteriorly lower density mass 4.5 x 2.1 cm, several right-sided lung nodules right lower lobe 1.4 cm, 3 other right lung nodules, 1.7 cm right iliac bone lesion      06/21/2015 Procedure    Left subpectoral mass biopsy: Invasive high-grade ductal carcinoma ER 5%, PR 0%, HER-2 negative ratio 1.29      09/01/2015 Imaging    Left chest wall mass increased in size 7.2 x 5.1 cm, multiple subcutaneous nodules, increase in the lung nodules both in number as well as in the size of existing nodules      09/10/2015 -  Chemotherapy    Carboplatin, gemcitabine days 1 and 8 q 3 weeks, carboplatin discontinued for neuropathy (treatment break from 01/20/2016 to 03/24/2016); Added Carboplatin back 04/14/16 (for progression)      01/26/2016 Imaging    Marked improvement in size of lung nodules with many of the nodules resolved index right lower lobe nodule 1.9 x 1.8 cm is now 0.7 x 0.6 cm, reduction in the size of left breast mass 7.2 cm down to 2.6 cm, enlargement in the hypodense mass in the uterus      04/06/2016 Imaging    CT chest: Interval progression of pre-existing lung nodules new lung metastases (37m, 114m 2.9 cm, 1.6 cm), interval progression of disease in the left breast 4.2 cm (was 2.6 cm), additional nodules 2.3 cm and 3.2 cm; CT head: scalp mass 1.9 cm      06/27/2016 Imaging    Ct chest: Stable lesions in lt breast deep to Left  pectoralis, cystic lesion loc ant in left breast inc compared to previous; decrease in lung nodules (51m to 3 mm; 12 mm to 7 mm, RML nodule 2.9 cm to 1.8 cm, RLL 148mto 7 mm)      11/14/2016 Imaging    CT CAP:RP and retrocrural lymphadenopathy, large uterine fibroids with necrosis; MRI Abd: RP lymphadenopathy CT chest 11/20/16: Worsening bulky conglomeration of mediastinal and right hilar lymph nodes, lung nodules subcentimeter size slight increase, left breast mass was 3.7 x 5.8 cm now 5.4 x 7.4 cm      12/08/2016 -  Chemotherapy    Palliative chemotherapy with Halaven days 1 and 8 every 3 weeks        CHIEF COMPLIANT: Halaven cycle 2 day 1  INTERVAL HISTORY: Paula KIMBRELLs a [age]ear old with above-mentioned history of  metastatic breast cancer currently on Halaven chemotherapy. Last week her chemotherapy was held because of neuropathy. This week patient reports that neuropathy is under good control and she has good energy levels and is ready to get started with her treatment. She reports that the tumor in the left chest wall has shrunken size. The scalp tumor appears to remain same.  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  I have reviewed the past medical history, past surgical history, social history and family history with the patient and they are unchanged from previous note.  ALLERGIES:  is allergic to codeine and morphine and related.  MEDICATIONS:  Current Outpatient Prescriptions  Medication Sig Dispense Refill  . B Complex-C (SUPER B COMPLEX PO) Take 1 tablet by mouth daily.    . calcium-vitamin D (OSCAL-500) 500-400 MG-UNIT tablet Take 1 tablet by mouth 2 (two) times daily. 60 tablet 3  . cephALEXin (KEFLEX) 500 MG capsule Take 1 capsule (500 mg total) by mouth 4 (four) times daily. 40 capsule 0  . dicyclomine (BENTYL) 10 MG capsule TAKE 1 CAPSULE (10 MG TOTAL) BY MOUTH 3 (THREE) TIMES DAILY AS NEEDED FOR SPASMS. 30 capsule 0  . gabapentin (NEURONTIN) 300 MG capsule TAKE 2 CAPSULES (600 MG TOTAL) BY MOUTH 3 (THREE) TIMES DAILY. 180 capsule 2  .  lidocaine-prilocaine (EMLA) cream Apply 1 application topically as needed. (Patient taking differently: Apply 1 application topically as needed (for port access). ) 30 g 6  . lidocaine-prilocaine (EMLA) cream Apply to affected area once 30 g 3  . LORazepam (ATIVAN) 1 MG tablet Take 1 tablet (1 mg total) by mouth at bedtime. 30 tablet 0  . nystatin (MYCOSTATIN) 100000 UNIT/ML suspension Take 5 mLs (500,000 Units total) by mouth 4 (four) times daily. 60 mL 0  . ondansetron (ZOFRAN) 4 MG tablet Take 1 tablet (4 mg total) by mouth every 8 (eight) hours as needed for nausea or vomiting. 30 tablet 0  . ondansetron (ZOFRAN) 8 MG tablet Take 1 tablet (8 mg total) by mouth 2 (two) times daily as needed (Nausea or vomiting). 30 tablet 1  . oxyCODONE (OXY  IR/ROXICODONE) 5 MG immediate release tablet Take 1 tablet (5 mg total) by mouth every 6 (six) hours as needed for severe pain. 30 tablet 0  . polyethylene glycol (MIRALAX / GLYCOLAX) packet Take 17 g by mouth 2 (two) times daily. 14 each 0  . prochlorperazine (COMPAZINE) 10 MG tablet TAKE 1 TABLET EVERY 6 HOURS AS NEEDED NAUEA OR VOMITING 30 tablet 1  . prochlorperazine (COMPAZINE) 10 MG tablet Take 1 tablet (10 mg total) by mouth every 6 (six) hours as needed (Nausea or vomiting). 30 tablet 1  . traMADol (ULTRAM) 50 MG tablet TAKE 1 TABLET BY MOUTH EVERY 6 HOURS AS NEEDED 30 tablet 0  . zolpidem (AMBIEN) 10 MG tablet Take 1 tablet (10 mg total) by mouth at bedtime. 30 tablet 2   No current facility-administered medications for this visit.    Facility-Administered Medications Ordered in Other Visits  Medication Dose Route Frequency Provider Last Rate Last Dose  . heparin lock flush 100 unit/mL  500 Units Intravenous Once PRN Nicholas Lose, MD      . sodium chloride flush (NS) 0.9 % injection 10 mL  10 mL Intravenous PRN Nicholas Lose, MD      . sodium chloride flush (NS) 0.9 % injection 10 mL  10 mL Intracatheter PRN Nicholas Lose, MD   10 mL at 01/05/17  1155    PHYSICAL EXAMINATION: ECOG PERFORMANCE STATUS: 1 - Symptomatic but completely ambulatory  Vitals:   01/05/17 1157  BP: (!) 141/76  Pulse: 99  Resp: 18  Temp: 99.1 F (37.3 C)   Filed Weights   01/05/17 1157  Weight: 116 lb 8 oz (52.8 kg)    GENERAL:alert, no distress and comfortable SKIN: skin color, texture, turgor are normal, no rashes or significant lesions,  large scalp lesion EYES: normal, Conjunctiva are pink and non-injected, sclera clear OROPHARYNX:no exudate, no erythema and lips, buccal mucosa, and tongue normal  NECK: supple, thyroid normal size, non-tender, without nodularity LYMPH:  no palpable lymphadenopathy in the cervical, axillary or inguinal LUNGS: clear to auscultation and percussion with normal breathing effort HEART: regular rate & rhythm and no murmurs and no lower extremity edema ABDOMEN:abdomen soft, non-tender and normal bowel sounds MUSCULOSKELETAL:no cyanosis of digits and no clubbing  NEURO: alert & oriented x 3 with fluent speech, no focal motor/sensory deficits EXTREMITIES: No lower extremity edema BREAST: Large left chest wall mass  LABORATORY DATA:  I have reviewed the data as listed   Chemistry      Component Value Date/Time   NA 136 01/05/2017 1103   K 4.3 01/05/2017 1103   CL 99 (L) 11/20/2016 2100   CO2 26 01/05/2017 1103   BUN 10.2 01/05/2017 1103   CREATININE 0.7 01/05/2017 1103      Component Value Date/Time   CALCIUM 9.6 01/05/2017 1103   ALKPHOS 67 01/05/2017 1103   AST 56 (H) 01/05/2017 1103   ALT 20 01/05/2017 1103   BILITOT 0.48 01/05/2017 1103       Lab Results  Component Value Date   WBC 9.8 01/05/2017   HGB 9.8 (L) 01/05/2017   HCT 31.5 (L) 01/05/2017   MCV 68.3 (L) 01/05/2017   PLT 286 01/05/2017   NEUTROABS 5.9 01/05/2017    ASSESSMENT & PLAN:  Bilateral breast cancer ([place]) Bilateral breast cancers are usually diagnosed 2015 treated with surgery, radiation, anastrozole Metastatic breast  cancer diagnosed 05/28/2015 as subpectoral mass, ER 5%, PR 0%, HER-2 negative, failed Xeloda and carboplatin and gemcitabine  CT CAP04/17/2017:RP  and retrocrural lymphadenopathy, large uterine fibroids with necrosis;  MRI Abd: RP lymphadenopathy CT chest 11/20/16: Worsening bulky conglomeration of mediastinal and right hilar lymph nodes, lung nodules subcentimeter size slight increase, left breast mass was 3.7 x 5.8 cm now 5.4 x 7.4 cm  Current treatment: 1. Halaven days 1 and 8 every 3 weeksToday's cycle 2 day 1 Held Halaven last week due to peripheral neuropathy and she received IV fluids.   I discussed with her about the risks and benefits of further chemotherapy And she is willing to proceed with the treatment.  Once we completed 3 cycles of treatment then we can consider having her hysterectomy as well as resection of the scalp tumor.  I spent 25 minutes talking to the patient of which more than half was spent in counseling and coordination of care.  No orders of the defined types were placed in this encounter.  The patient has a good understanding of the overall plan. she agrees with it. she will call with any problems that may develop before the next visit here.   Rulon Eisenmenger, MD 01/05/17

## 2017-01-05 NOTE — Patient Instructions (Signed)
[facility] Discharge Instructions for Patients Receiving Chemotherapy  Today you received the following chemotherapy agents :  Halaven.  To help prevent nausea and vomiting after your treatment, we encourage you to take your nausea medication as prescribed.   If you develop nausea and vomiting that is not controlled by your nausea medication, call the clinic.   BELOW ARE SYMPTOMS THAT SHOULD BE REPORTED IMMEDIATELY:  *FEVER GREATER THAN 100.5 F  *CHILLS WITH OR WITHOUT FEVER  NAUSEA AND VOMITING THAT IS NOT CONTROLLED WITH YOUR NAUSEA MEDICATION  *UNUSUAL SHORTNESS OF BREATH  *UNUSUAL BRUISING OR BLEEDING  TENDERNESS IN MOUTH AND THROAT WITH OR WITHOUT PRESENCE OF ULCERS  *URINARY PROBLEMS  *BOWEL PROBLEMS  UNUSUAL RASH Items with * indicate a potential emergency and should be followed up as soon as possible.  Feel free to call the clinic you have any questions or concerns. The clinic phone number is [phone].  Please show the [place] at check-in to the Emergency Department and triage nurse.

## 2017-01-05 NOTE — Assessment & Plan Note (Signed)
Bilateral breast cancers are usually diagnosed 2015 treated with surgery, radiation, anastrozole Metastatic breast cancer diagnosed 05/28/2015 as subpectoral mass, ER 5%, PR 0%, HER-2 negative, failed Xeloda and carboplatin and gemcitabine  CT CAP04/17/2017:RP and retrocrural lymphadenopathy, large uterine fibroids with necrosis;  MRI Abd: RP lymphadenopathy CT chest 11/20/16: Worsening bulky conglomeration of mediastinal and right hilar lymph nodes, lung nodules subcentimeter size slight increase, left breast mass was 3.7 x 5.8 cm now 5.4 x 7.4 cm  Current treatment: 1. Halaven days 1 and 8 every 3 weeks Held Halaven last week due to peripheral neuropathy and she received IV fluids.   I discussed with her about the risks and benefits of further chemotherapy. I believe that the patient has been getting progressively weaker and her performance status is deteriorating.  We discussed hospice and palliative care options with the patient.

## 2017-01-09 ENCOUNTER — Telehealth: Payer: Self-pay | Admitting: *Deleted

## 2017-01-09 NOTE — Telephone Encounter (Signed)
"  [place] [facility] needs you all to fax my appointment information to  Them noting the appointment cannot be rescheduled.  The number to cal is [phone]."  [place] [facility] revealed "we informed her all transportation request require five day advance request.  For Medicaid billing audits documentation is required with patient's name, d.o.b., appointment time, location that reads Appointment cannot be rescheduled."   Faxed appointment calender, demographics and noted appointment cannot be rescheduled.  Unable to reach patient to confirm transportation request. Care coordiantion note added to alert [facility] staff.

## 2017-01-10 ENCOUNTER — Telehealth: Payer: Self-pay

## 2017-01-10 ENCOUNTER — Other Ambulatory Visit: Payer: Self-pay

## 2017-01-10 ENCOUNTER — Telehealth: Payer: Self-pay | Admitting: *Deleted

## 2017-01-10 DIAGNOSIS — C770 Secondary and unspecified malignant neoplasm of lymph nodes of head, face and neck: Secondary | ICD-10-CM

## 2017-01-10 DIAGNOSIS — C7801 Secondary malignant neoplasm of right lung: Secondary | ICD-10-CM

## 2017-01-10 DIAGNOSIS — C7951 Secondary malignant neoplasm of bone: Secondary | ICD-10-CM

## 2017-01-10 DIAGNOSIS — C50011 Malignant neoplasm of nipple and areola, right female breast: Secondary | ICD-10-CM

## 2017-01-10 DIAGNOSIS — C50919 Malignant neoplasm of unspecified site of unspecified female breast: Secondary | ICD-10-CM

## 2017-01-10 DIAGNOSIS — C50012 Malignant neoplasm of nipple and areola, left female breast: Secondary | ICD-10-CM

## 2017-01-10 MED ORDER — CLOBETASOL PROP EMOLLIENT BASE 0.05 % EX CREA: 1.0000 "application " | TOPICAL_CREAM | Freq: Two times a day (BID) | CUTANEOUS | 0 refills | Status: DC

## 2017-01-10 NOTE — Progress Notes (Signed)
Clobetasol cream requested to be refilled for her neuropathy in her hands which causes itching. Pt states that she was on this a few years back and would like to have it again. Dr.Gudena notified and will send script to preferred pharmacy.

## 2017-01-10 NOTE — Telephone Encounter (Signed)
Pt called that she has been throwing up and in the bathroom since 0800.  She has thrown up two time today. Last week she stayed nauseated and when she took ondansetron she threw up. This happened again to day she vomited after she took compazine.  She is having diarrhea, she has had 10-12 BM today, started out good until now it is nothing but water.   No fever, did start hurting under L breast today. She has been drinking about 1 glass of water today and 1 boost.  Usually drinks about 4-5 bottles water /day.  Does she need to go to ED?

## 2017-01-10 NOTE — Telephone Encounter (Signed)
Reordered and sent to [facility].

## 2017-01-10 NOTE — Telephone Encounter (Signed)
Called pt to return her call regarding complaints of 10 episodes of loose mild- moderate diarrhea that started this morning. Pt states that she had chemo last week, and she normally doesn't get any diarrhea. She does, however, gets nausea/vomiting a few days after chemo. Pt states that she is has been taking compazine to prevent n/v but not as frequent. Pt also reports that she had eaten some left over food and salad that was brown and old, but pt ate it because she didn't want to waste the food. Pt was taking otc laxatives and stool softener multiple times over the weekend d/t constipation from last week. Told pt that her stool softeners/ laxatives could be the cause of her diarrhea, and the food that she ate could have been bad. Pt denies any fevers or chills. Last temp 99.4. Advised pt to increase hydration and to eat light smaller meals throughout the day. Pt took her bentyl for IBS after all her BM's and is somewhat feeling some relief. Told pt she may take this 3x daily as needed. If her symptoms gets worse and her diarrhea starts increasing again, pt advised to go to ED. Encouraged pt to check temp and gave education on hand and food sanitation, especially after chemo treatments. Also, told pt to stop taking laxatives and stool softeners now. Pt verbalized understanding and will keep a close eye on her symptoms. Notified pt about her upcoming CT scans and she had verified date/time. Pt will be picking up oral contrast bottle at [facility] because they have different flavors for contrast. Pt to call with any further questions.

## 2017-01-10 NOTE — Telephone Encounter (Signed)
"  I need a medication called in for neuropathy to my hands and feet.  It was something Dr. Humphrey Rolls had ordered in 2015.  I thought it would have been called in last week but has not been called in yet."

## 2017-01-10 NOTE — Telephone Encounter (Signed)
Called pt to let her know that a follow up CT chest/abd/pelvis is scheduled for 02/05/17 at 11:30. Attempted to contact patient x2 but was unsuccessful. Unable to lvm d/t vm full. Pt will need to come in the cancer center and pick up her oral contrast bottle. She will need to take the 1st bottle at 930am prior to CT and 2nd bottle at 1030 prior to CT. She must be NPO 4 hrs before her scheduled CT.

## 2017-01-10 NOTE — Telephone Encounter (Signed)
Called pt and documented. Thank you.

## 2017-01-10 NOTE — Telephone Encounter (Signed)
Pt called with medication name Clobetasol 0.05% apply BID prn.

## 2017-01-11 ENCOUNTER — Other Ambulatory Visit: Payer: Self-pay | Admitting: Hematology and Oncology

## 2017-01-11 DIAGNOSIS — R1030 Lower abdominal pain, unspecified: Secondary | ICD-10-CM

## 2017-01-12 ENCOUNTER — Encounter (HOSPITAL_BASED_OUTPATIENT_CLINIC_OR_DEPARTMENT_OTHER): Payer: Medicare Other

## 2017-01-12 ENCOUNTER — Ambulatory Visit: Payer: Medicare Other

## 2017-01-12 ENCOUNTER — Other Ambulatory Visit: Payer: Self-pay

## 2017-01-12 ENCOUNTER — Ambulatory Visit (HOSPITAL_BASED_OUTPATIENT_CLINIC_OR_DEPARTMENT_OTHER): Payer: Medicare Other

## 2017-01-12 VITALS — BP 124/72 | HR 76 | Temp 98.6°F | Resp 18

## 2017-01-12 DIAGNOSIS — C50011 Malignant neoplasm of nipple and areola, right female breast: Secondary | ICD-10-CM | POA: Diagnosis not present

## 2017-01-12 DIAGNOSIS — C50911 Malignant neoplasm of unspecified site of right female breast: Secondary | ICD-10-CM

## 2017-01-12 DIAGNOSIS — C50012 Malignant neoplasm of nipple and areola, left female breast: Secondary | ICD-10-CM | POA: Diagnosis not present

## 2017-01-12 DIAGNOSIS — C7951 Secondary malignant neoplasm of bone: Secondary | ICD-10-CM

## 2017-01-12 DIAGNOSIS — C50919 Malignant neoplasm of unspecified site of unspecified female breast: Secondary | ICD-10-CM

## 2017-01-12 DIAGNOSIS — Z170 Estrogen receptor positive status [ER+]: Secondary | ICD-10-CM

## 2017-01-12 DIAGNOSIS — C770 Secondary and unspecified malignant neoplasm of lymph nodes of head, face and neck: Secondary | ICD-10-CM

## 2017-01-12 DIAGNOSIS — C50912 Malignant neoplasm of unspecified site of left female breast: Secondary | ICD-10-CM

## 2017-01-12 LAB — CBC WITH DIFFERENTIAL/PLATELET
BASO%: 2.3 % — ABNORMAL HIGH (ref 0.0–2.0)
BASOS ABS: 0.1 10*3/uL (ref 0.0–0.1)
EOS ABS: 0.1 10*3/uL (ref 0.0–0.5)
EOS%: 1.5 % (ref 0.0–7.0)
HEMATOCRIT: 30.9 % — AB (ref 34.8–46.6)
HGB: 9.6 g/dL — ABNORMAL LOW (ref 11.6–15.9)
LYMPH%: 52.9 % — AB (ref 14.0–49.7)
MCH: 21.1 pg — ABNORMAL LOW (ref 25.1–34.0)
MCHC: 31.1 g/dL — ABNORMAL LOW (ref 31.5–36.0)
MCV: 67.8 fL — AB (ref 79.5–101.0)
MONO#: 0.6 10*3/uL (ref 0.1–0.9)
MONO%: 10.5 % (ref 0.0–14.0)
NEUT%: 32.8 % — ABNORMAL LOW (ref 38.4–76.8)
NEUTROS ABS: 2 10*3/uL (ref 1.5–6.5)
NRBC: 0 % (ref 0–0)
PLATELETS: 253 10*3/uL (ref 145–400)
RBC: 4.56 10*6/uL (ref 3.70–5.45)
RDW: 22.2 % — ABNORMAL HIGH (ref 11.2–14.5)
WBC: 6.1 10*3/uL (ref 3.9–10.3)
lymph#: 3.2 10*3/uL (ref 0.9–3.3)

## 2017-01-12 LAB — COMPREHENSIVE METABOLIC PANEL
ALK PHOS: 67 U/L (ref 40–150)
ALT: 29 U/L (ref 0–55)
ANION GAP: 10 meq/L (ref 3–11)
AST: 66 U/L — ABNORMAL HIGH (ref 5–34)
Albumin: 3.1 g/dL — ABNORMAL LOW (ref 3.5–5.0)
BILIRUBIN TOTAL: 0.58 mg/dL (ref 0.20–1.20)
BUN: 17.4 mg/dL (ref 7.0–26.0)
CALCIUM: 9.8 mg/dL (ref 8.4–10.4)
CO2: 26 meq/L (ref 22–29)
CREATININE: 0.8 mg/dL (ref 0.6–1.1)
Chloride: 100 mEq/L (ref 98–109)
EGFR: 88 mL/min/{1.73_m2} — AB (ref >90)
Glucose: 87 mg/dl (ref 70–140)
Potassium: 3.9 mEq/L (ref 3.5–5.1)
Sodium: 136 mEq/L (ref 136–145)
TOTAL PROTEIN: 8 g/dL (ref 6.4–8.3)

## 2017-01-12 LAB — TECHNOLOGIST REVIEW

## 2017-01-12 MED ORDER — LORAZEPAM 1 MG PO TABS: 1.0000 mg | ORAL_TABLET | Freq: Every day | ORAL | 0 refills | Status: DC

## 2017-01-12 MED ORDER — HEPARIN SOD (PORK) LOCK FLUSH 100 UNIT/ML IV SOLN
500.0000 [IU] | Freq: Once | INTRAVENOUS | Status: AC | PRN
  Administered 2017-01-12: 500 [IU]
  Filled 2017-01-12: qty 5

## 2017-01-12 MED ORDER — SODIUM CHLORIDE 0.9% FLUSH
10.0000 mL | INTRAVENOUS | Status: DC | PRN
  Administered 2017-01-12: 10 mL
  Filled 2017-01-12: qty 10

## 2017-01-12 MED ORDER — SODIUM CHLORIDE 0.9 % IV SOLN
Freq: Once | INTRAVENOUS | Status: AC
  Administered 2017-01-12: 14:00:00 via INTRAVENOUS

## 2017-01-12 MED ORDER — SODIUM CHLORIDE 0.9 % IV SOLN
1.3000 mg/m2 | Freq: Once | INTRAVENOUS | Status: AC
  Administered 2017-01-12: 2 mg via INTRAVENOUS
  Filled 2017-01-12: qty 4

## 2017-01-12 MED ORDER — PROCHLORPERAZINE MALEATE 10 MG PO TABS
ORAL_TABLET | ORAL | Status: AC
Start: 2017-01-12 — End: 2017-01-12
  Filled 2017-01-12: qty 1

## 2017-01-12 MED ORDER — SODIUM CHLORIDE 0.9% FLUSH
10.0000 mL | INTRAVENOUS | Status: DC | PRN
  Administered 2017-01-12: 10 mL via INTRAVENOUS
  Filled 2017-01-12: qty 10

## 2017-01-12 MED ORDER — PROCHLORPERAZINE MALEATE 10 MG PO TABS
10.0000 mg | ORAL_TABLET | Freq: Once | ORAL | Status: AC
  Administered 2017-01-12: 10 mg via ORAL

## 2017-01-12 NOTE — Patient Instructions (Signed)
[facility] Discharge Instructions for Patients Receiving Chemotherapy  Today you received the following chemotherapy agents Halaven.  To help prevent nausea and vomiting after your treatment, we encourage you to take your nausea medication.   If you develop nausea and vomiting that is not controlled by your nausea medication, call the clinic.   BELOW ARE SYMPTOMS THAT SHOULD BE REPORTED IMMEDIATELY:  *FEVER GREATER THAN 100.5 F  *CHILLS WITH OR WITHOUT FEVER  NAUSEA AND VOMITING THAT IS NOT CONTROLLED WITH YOUR NAUSEA MEDICATION  *UNUSUAL SHORTNESS OF BREATH  *UNUSUAL BRUISING OR BLEEDING  TENDERNESS IN MOUTH AND THROAT WITH OR WITHOUT PRESENCE OF ULCERS  *URINARY PROBLEMS  *BOWEL PROBLEMS  UNUSUAL RASH Items with * indicate a potential emergency and should be followed up as soon as possible.  Feel free to call the clinic you have any questions or concerns. The clinic phone number is [phone].  Please show the [place] at check-in to the Emergency Department and triage nurse.

## 2017-01-12 NOTE — Patient Instructions (Signed)

## 2017-01-12 NOTE — Progress Notes (Signed)
Spoke with pt in the lobby at the cancer center today.Pt requesting refill for her tramadol and lorazepam. Told pt that we will call it in her pharmacy today. Advised that she calls her pharmacy to make sure its available for pick up. Pt verbalized undertstanding.

## 2017-01-16 ENCOUNTER — Telehealth: Payer: Self-pay | Admitting: *Deleted

## 2017-01-16 NOTE — Telephone Encounter (Signed)
"  Calling to check on status of C.A.P.S. form I left a few weeks ago.  I'm trying to get help.  Doing okay right now but need someone to sit with me a few hours a day."  Message left for collaborative.  "I'll mail a new one today to make sure he receives this to sign."

## 2017-01-19 ENCOUNTER — Ambulatory Visit: Payer: Medicare Other

## 2017-01-19 ENCOUNTER — Ambulatory Visit: Payer: Medicare Other | Admitting: Adult Health

## 2017-01-19 ENCOUNTER — Other Ambulatory Visit: Payer: Medicare Other

## 2017-01-25 NOTE — Progress Notes (Signed)
Paula Navarro Follow up:    Paula Donna, MD (Inactive) No address on file   DIAGNOSIS: Navarro Staging Bilateral breast Navarro [facility]) Staging form: Breast, AJCC 7th Edition - Clinical: Stage IIB (T2, N1, cM0) - Unsigned Staging comments: Staged at breast conference 08/13/13  - Pathologic: No stage assigned - Unsigned   SUMMARY OF ONCOLOGIC HISTORY:   Bilateral breast Navarro ([place])   07/23/2013 Mammogram    Bilateral breast masses. 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 N1 M0 stage IIB ER 100%, PR 0%, HER-2       06/17/2014 -  Radiation Therapy    Adjuvant radiation therapy      09/14/2014 -  Anti-estrogen oral therapy    Anastrozole 1 mg daily      05/28/2015 Imaging    CT scans: Enlarging subpectoral masses 3.1 x 3.5 cm, posteriorly lower density mass 4.5 x 2.1 cm, several right-sided lung nodules right lower lobe 1.4 cm, 3 other right lung nodules, 1.7 cm right iliac bone lesion      06/21/2015 Procedure    Left subpectoral mass biopsy: Invasive high-grade ductal carcinoma ER 5%, PR 0%, HER-2 negative ratio 1.29      09/01/2015 Imaging    Left chest wall mass increased in size 7.2 x 5.1 cm, multiple subcutaneous nodules, increase in the lung nodules both in number as well as in the size of existing nodules      09/10/2015 -  Chemotherapy    Carboplatin, gemcitabine days 1 and 8 q 3 weeks, carboplatin discontinued for neuropathy (treatment break from 01/20/2016 to 03/24/2016); Added Carboplatin back 04/14/16 (for progression)      01/26/2016 Imaging    Marked improvement in size of lung nodules with many of the nodules resolved index right lower lobe nodule 1.9 x 1.8 cm is now 0.7 x 0.6 cm, reduction in the size of left breast mass 7.2 cm down to 2.6 cm, enlargement in the hypodense mass in the uterus      04/06/2016 Imaging    CT chest: Interval progression of pre-existing lung nodules new lung metastases (32m, 1102m 2.9 cm, 1.6 cm), interval progression of disease in the left breast 4.2 cm (was 2.6 cm), additional nodules 2.3 cm and 3.2 cm; CT head: scalp mass 1.9 cm      06/27/2016 Imaging    Ct chest: Stable lesions in lt breast  deep to Left pectoralis, cystic lesion loc ant in left breast inc compared to previous; decrease in lung nodules (30m to 3 mm; 12 mm to 7 mm, RML nodule 2.9 cm to 1.8 cm, RLL 187mto 7 mm)      11/14/2016 Imaging    CT CAP:RP and retrocrural lymphadenopathy, large uterine fibroids with necrosis; MRI Abd: RP lymphadenopathy CT chest 11/20/16: Worsening bulky conglomeration of mediastinal and right hilar lymph nodes, lung nodules subcentimeter size slight increase, left breast mass was 3.7 x 5.8 cm now 5.4 x 7.4 cm      12/08/2016 -  Chemotherapy    Palliative chemotherapy with Halaven days 1 and 8 every 3 weeks        CURRENT THERAPY: Eribulin cycle 3 day1  INTERVAL HISTORY: Paula GAHM89.o. female returns for evaluation prior to  receiving eribulin.  She is feeling much improved.  Her neuropathy, though encompassing her entire fingers is intermittent and doesn't interfere with any of her motor functions.  She says that she is able to walk without difficulty.  She feels her scalp lesion may be a little worse and is concerned that she hasn't heard anything from Dr.Toth's office to take care of it.    Patient Active Problem List   Diagnosis Date Noted  . Endometrial Navarro (HCTrinity05/21/2018  . Protein-calorie malnutrition, severe 11/15/2016  . UTI (urinary tract infection) 11/14/2016  . COPD (chronic obstructive pulmonary disease) (HCMilan  . Metastasis to supraclavicular lymph node (HCBostonia11/19/2017  . Chemotherapy-induced thrombocytopenia 06/13/2016  . Scalp lesion 04/01/2016  . Encounter for central line care 12/08/2015  . Port catheter in place 11/19/2015  . Insomnia 11/01/2015  . Microcytic anemia 09/28/2015  . Lung metastases (HCCastleford02/04/2016  . Bone metastases (HCGlen White12/20/2016  . Goals of care, counseling/discussion 07/13/2015  . Metastatic breast Navarro (HCMcClenney Tract11/29/2016  . Chronic pain 03/16/2015  . Vaginal bleeding 09/24/2014  . Postherpetic neuralgia 09/03/2014  . Lymphedema 09/03/2014  . Suspected herpes zoster left C5 distribution 08/14/2014  . Neuropathy due to chemotherapeutic drug (HCValencia08/10/2013  . Anxiety 03/03/2014  . Bilateral breast Navarro (HCBig Sky01/06/2014  . Palpitation   . Chest pain   . Atrial septal defect   . Laboratory test 11/25/2011  . Chronic bronchitis   . Tobacco abuse     is allergic to codeine and morphine and related.  MEDICAL HISTORY: Past Medical History:  Diagnosis Date  . Anemia   . Anxiety   . Atrial septal defect 1996   Surgical repair in 1996  . Breast Navarro (HCCarter  . Chest pain    Admitted to [facility] in 09/2011; refused stress test  . Chronic bronchitis   . Chronic pain   . COPD (chronic obstructive pulmonary disease) (HCGages Lake   on xray  . Palpitation     Tachycardia reported by monitor clerk during a symptomatic spell  . Radiation 06/30/14-08/17/14   Bilateral Breast  . Tobacco abuse    60 pack years; 1.5 packs per day  . Wears dentures    top    SURGICAL HISTORY: Past Surgical History:  Procedure Laterality Date  . ASD REPAIR, OSTIUM PRIMUM  1996   dr geRoxy Horseman. AXILLARY LYMPH NODE DISSECTION Bilateral 04/08/2014   Procedure:  BILATERAL AXILLARY LYMPH NODE DISSECTION;  Surgeon: PaAutumn MessingII, MD;  [place] Service: General;  Laterality: Bilateral;  . BREAST BIOPSY Bilateral   . BREAST LUMPECTOMY WITH RADIOACTIVE  SEED LOCALIZATION Bilateral 04/08/2014   Procedure: BILATERAL  RADIOACTIVE SEED LOCALIZATION LUMPECTOMY ;  Surgeon: Autumn Messing III, MD;  Location: [facility];  Service: General;  Laterality: Bilateral;  . CESAREAN SECTION     x3  . CHOLECYSTECTOMY    . open heart surgery    . PORT A CATH REVISION  1/15   put in   . TUBAL LIGATION      SOCIAL HISTORY: Social History   Social History  . Marital status: Widowed    Spouse name: N/A  . Number of children: N/A  . Years of education: N/A   Occupational History  . Not on file.   Social History Main Topics  . Smoking status: Former Smoker    Packs/day: 1.50    Years: 40.00    Types: Cigarettes    Quit date: 08/01/2012  . Smokeless tobacco: Never Used  . Alcohol use No  . Drug use: No  . Sexual activity: Not Currently   Other Topics Concern  . Not on file   Social History Narrative  . No narrative on file    FAMILY HISTORY: Family History  Problem Relation Age of Onset  . Breast Navarro Maternal Aunt     Review of Systems  Constitutional: Negative for appetite change, chills, diaphoresis, fatigue, fever and unexpected weight change.  HENT:   Negative for hearing loss and lump/mass.   Eyes: Negative for eye problems and icterus.  Respiratory: Negative for chest tightness, cough and shortness of breath.     Cardiovascular: Negative for chest pain, leg swelling and palpitations.  Gastrointestinal: Negative for abdominal distention and abdominal pain.  Endocrine: Negative for hot flashes.  Genitourinary: Negative for difficulty urinating.   Musculoskeletal: Negative for arthralgias and gait problem.  Skin: Negative for itching and rash.  Neurological: Positive for numbness. Negative for dizziness, extremity weakness, gait problem and headaches.  Psychiatric/Behavioral: Negative for depression. The patient is not nervous/anxious.       PHYSICAL EXAMINATION  ECOG PERFORMANCE STATUS: 2 - Symptomatic, <50% confined to bed  Vitals:   01/26/17 1200  BP: 130/75  Pulse: 76  Resp: 18  Temp: 98.4 F (36.9 C)    Physical Exam  Constitutional: She is oriented to person, place, and time and well-developed, well-nourished, and in no distress.  HENT:  Head: Normocephalic and atraumatic.  Mouth/Throat: Oropharynx is clear and moist. No oropharyngeal exudate.  Eyes: Pupils are equal, round, and reactive to light. No scleral icterus.  Neck: Neck supple.  Cardiovascular: Normal rate, regular rhythm and normal heart sounds.   Pulmonary/Chest: Effort normal and breath sounds normal.  Abdominal: Soft. Bowel sounds are normal.  Musculoskeletal: She exhibits no edema.  Neurological: She is alert and oriented to person, place, and time.  Skin: Skin is warm and dry.  See picture below  Psychiatric: Mood and affect normal.   Above: 01/26/2017 left axillary tail     Above: 01/26/2017 scalp tumor      LABORATORY DATA:  CBC    Component Value Date/Time   WBC 6.1 01/26/2017 1115   WBC 12.3 (H) 11/20/2016 2100   RBC 4.56 01/26/2017 1115   RBC 3.48 (L) 11/20/2016 2100   HGB 9.6 (L) 01/26/2017 1115   HCT 30.8 (L) 01/26/2017 1115   PLT 242 01/26/2017 1115   MCV 67.6 (L) 01/26/2017 1115   MCH 21.0 (L) 01/26/2017 1115   MCH 22.4 (L) 11/20/2016 2100   MCHC 31.1 (L) 01/26/2017 1115   MCHC 31.5  11/20/2016 2100   RDW 23.1 (H) 01/26/2017 1115   LYMPHSABS 2.1 01/26/2017 1115   MONOABS 0.9 01/26/2017 1115   EOSABS 0.0 01/26/2017 1115   BASOSABS 0.1 01/26/2017 1115    CMP     Component Value Date/Time   NA 137 01/26/2017 1115   K 4.3 01/26/2017 1115   CL 99 (L) 11/20/2016 2100   CO2 27 01/26/2017 1115   GLUCOSE 126 01/26/2017 1115   BUN 12.2 01/26/2017 1115   CREATININE 0.7 01/26/2017 1115   CALCIUM 9.4 01/26/2017 1115   PROT 7.3 01/26/2017 1115   ALBUMIN 2.8 (L) 01/26/2017 1115   AST 57 (H) 01/26/2017 1115   ALT 26 01/26/2017 1115   ALKPHOS 66 01/26/2017 1115   BILITOT 0.35 01/26/2017 1115   GFRNONAA >60 11/20/2016 2100   GFRAA >60 11/20/2016 2100            ASSESSMENT and THERAPY PLAN:   Bilateral breast Navarro ([place]) Bilateral breast cancers are usually diagnosed 2015 treated with surgery, radiation, anastrozole Metastatic breast Navarro diagnosed 05/28/2015 as subpectoral mass, ER 5%, PR 0%, HER-2 negative, failed Xeloda and carboplatin and gemcitabine  CT CAP04/17/2017:RP and retrocrural lymphadenopathy, large uterine fibroids with necrosis;  MRI Abd: RP lymphadenopathy CT chest 11/20/16: Worsening bulky conglomeration of mediastinal and right hilar lymph nodes, lung nodules subcentimeter size slight increase, left breast mass was 3.7 x 5.8 cm now 5.4 x 7.4 cm  Current treatment: 1. Eribulin days 1 and 8 every 3 weeks  Today is cycle 3 day 1.  We had a detailed conversation about peripheral neuropathy.  I counseled her extensively on the risk of losing the functionality of walking and motor capabilities of her fingertips.  She is taking Gabapentin and will proceed with treatment.  If it worsens in any way, we discussed stopping therapy.    She also has this scalp lesion in addition to the endometrial CA.  She is having a scan on 02/05/17 and will see Dr. Denman George that day for eval.  They were planning for her to go to OR July 17 if possible.  Unfortunately,  Dr. Marlou Starks cannot do surgery that week.  Melissa the NP with [facility] has reached back out to Dr. Marlou Starks.  Hopefully they can coordinate one operation.   All questions were answered. The patient knows to call the clinic with any problems, questions or concerns. We can certainly see the patient much sooner if necessary.  A total of (30) minutes of face-to-face time was spent with this patient with greater than 50% of that time in counseling and care-coordination.  This note was electronically signed. Scot Dock, NP 01/26/2017

## 2017-01-25 NOTE — Assessment & Plan Note (Addendum)
Bilateral breast cancers are usually diagnosed 2015 treated with surgery, radiation, anastrozole Metastatic breast cancer diagnosed 05/28/2015 as subpectoral mass, ER 5%, PR 0%, HER-2 negative, failed Xeloda and carboplatin and gemcitabine  CT CAP04/17/2017:RP and retrocrural lymphadenopathy, large uterine fibroids with necrosis;  MRI Abd: RP lymphadenopathy CT chest 11/20/16: Worsening bulky conglomeration of mediastinal and right hilar lymph nodes, lung nodules subcentimeter size slight increase, left breast mass was 3.7 x 5.8 cm now 5.4 x 7.4 cm  Current treatment: 1. Eribulin days 1 and 8 every 3 weeks  Today is cycle 3 day 1.  We had a detailed conversation about peripheral neuropathy.  I counseled her extensively on the risk of losing the functionality of walking and motor capabilities of her fingertips.  She is taking Gabapentin and will proceed with treatment.  If it worsens in any way, we discussed stopping therapy.    She also has this scalp lesion in addition to the endometrial CA.  She is having a scan on 02/05/17 and will see Dr. Rossi that day for eval.  They were planning for her to go to OR July 17 if possible.  Unfortunately, Dr. Toth cannot do surgery that week.  Melissa the NP with [facility] has reached back out to Dr. Toth.  Hopefully they can coordinate one operation.  

## 2017-01-26 ENCOUNTER — Ambulatory Visit (HOSPITAL_BASED_OUTPATIENT_CLINIC_OR_DEPARTMENT_OTHER): Payer: Medicare Other

## 2017-01-26 ENCOUNTER — Encounter: Payer: Self-pay | Admitting: Adult Health

## 2017-01-26 ENCOUNTER — Other Ambulatory Visit (HOSPITAL_BASED_OUTPATIENT_CLINIC_OR_DEPARTMENT_OTHER): Payer: Medicare Other

## 2017-01-26 ENCOUNTER — Other Ambulatory Visit: Payer: Self-pay | Admitting: *Deleted

## 2017-01-26 ENCOUNTER — Ambulatory Visit (HOSPITAL_BASED_OUTPATIENT_CLINIC_OR_DEPARTMENT_OTHER): Payer: Medicare Other | Admitting: Adult Health

## 2017-01-26 ENCOUNTER — Ambulatory Visit: Payer: Medicare Other

## 2017-01-26 VITALS — BP 130/75 | HR 76 | Temp 98.4°F | Resp 18 | Ht 62.0 in | Wt 119.4 lb

## 2017-01-26 DIAGNOSIS — C50911 Malignant neoplasm of unspecified site of right female breast: Secondary | ICD-10-CM

## 2017-01-26 DIAGNOSIS — C770 Secondary and unspecified malignant neoplasm of lymph nodes of head, face and neck: Secondary | ICD-10-CM

## 2017-01-26 DIAGNOSIS — C50011 Malignant neoplasm of nipple and areola, right female breast: Secondary | ICD-10-CM | POA: Diagnosis not present

## 2017-01-26 DIAGNOSIS — C50912 Malignant neoplasm of unspecified site of left female breast: Secondary | ICD-10-CM

## 2017-01-26 DIAGNOSIS — Z170 Estrogen receptor positive status [ER+]: Secondary | ICD-10-CM

## 2017-01-26 DIAGNOSIS — C7951 Secondary malignant neoplasm of bone: Secondary | ICD-10-CM

## 2017-01-26 DIAGNOSIS — C50012 Malignant neoplasm of nipple and areola, left female breast: Secondary | ICD-10-CM

## 2017-01-26 DIAGNOSIS — C7800 Secondary malignant neoplasm of unspecified lung: Secondary | ICD-10-CM

## 2017-01-26 DIAGNOSIS — B3781 Candidal esophagitis: Principal | ICD-10-CM

## 2017-01-26 DIAGNOSIS — B370 Candidal stomatitis: Secondary | ICD-10-CM

## 2017-01-26 DIAGNOSIS — C50919 Malignant neoplasm of unspecified site of unspecified female breast: Secondary | ICD-10-CM

## 2017-01-26 LAB — COMPREHENSIVE METABOLIC PANEL
ALBUMIN: 2.8 g/dL — AB (ref 3.5–5.0)
ALT: 26 U/L (ref 0–55)
AST: 57 U/L — AB (ref 5–34)
Alkaline Phosphatase: 66 U/L (ref 40–150)
Anion Gap: 6 mEq/L (ref 3–11)
BILIRUBIN TOTAL: 0.35 mg/dL (ref 0.20–1.20)
BUN: 12.2 mg/dL (ref 7.0–26.0)
CALCIUM: 9.4 mg/dL (ref 8.4–10.4)
CO2: 27 mEq/L (ref 22–29)
CREATININE: 0.7 mg/dL (ref 0.6–1.1)
Chloride: 104 mEq/L (ref 98–109)
EGFR: >90 mL/min/{1.73_m2} (ref >90)
GLUCOSE: 126 mg/dL (ref 70–140)
POTASSIUM: 4.3 meq/L (ref 3.5–5.1)
SODIUM: 137 meq/L (ref 136–145)
TOTAL PROTEIN: 7.3 g/dL (ref 6.4–8.3)

## 2017-01-26 LAB — CBC WITH DIFFERENTIAL/PLATELET
BASO%: 0.9 % (ref 0.0–2.0)
BASOS ABS: 0.1 10*3/uL (ref 0.0–0.1)
EOS ABS: 0 10*3/uL (ref 0.0–0.5)
EOS%: 0.1 % (ref 0.0–7.0)
HEMATOCRIT: 30.8 % — AB (ref 34.8–46.6)
HEMOGLOBIN: 9.6 g/dL — AB (ref 11.6–15.9)
LYMPH#: 2.1 10*3/uL (ref 0.9–3.3)
LYMPH%: 34.6 % (ref 14.0–49.7)
MCH: 21 pg — AB (ref 25.1–34.0)
MCHC: 31.1 g/dL — ABNORMAL LOW (ref 31.5–36.0)
MCV: 67.6 fL — ABNORMAL LOW (ref 79.5–101.0)
MONO#: 0.9 10*3/uL (ref 0.1–0.9)
MONO%: 15.2 % — ABNORMAL HIGH (ref 0.0–14.0)
NEUT%: 49.2 % (ref 38.4–76.8)
NEUTROS ABS: 3 10*3/uL (ref 1.5–6.5)
Platelets: 242 10*3/uL (ref 145–400)
RBC: 4.56 10*6/uL (ref 3.70–5.45)
RDW: 23.1 % — AB (ref 11.2–14.5)
WBC: 6.1 10*3/uL (ref 3.9–10.3)

## 2017-01-26 MED ORDER — HEPARIN SOD (PORK) LOCK FLUSH 100 UNIT/ML IV SOLN
500.0000 [IU] | Freq: Once | INTRAVENOUS | Status: AC | PRN
  Administered 2017-01-26: 500 [IU]
  Filled 2017-01-26: qty 5

## 2017-01-26 MED ORDER — PROCHLORPERAZINE MALEATE 10 MG PO TABS
10.0000 mg | ORAL_TABLET | Freq: Once | ORAL | Status: AC
  Administered 2017-01-26: 10 mg via ORAL

## 2017-01-26 MED ORDER — SODIUM CHLORIDE 0.9% FLUSH
10.0000 mL | INTRAVENOUS | Status: DC | PRN
Start: 2017-01-26 — End: 2017-01-26
  Administered 2017-01-26: 10 mL via INTRAVENOUS
  Filled 2017-01-26: qty 10

## 2017-01-26 MED ORDER — PROCHLORPERAZINE MALEATE 10 MG PO TABS
ORAL_TABLET | ORAL | Status: AC
  Filled 2017-01-26: qty 1

## 2017-01-26 MED ORDER — SODIUM CHLORIDE 0.9% FLUSH
10.0000 mL | INTRAVENOUS | Status: DC | PRN
  Administered 2017-01-26: 10 mL
  Filled 2017-01-26: qty 10

## 2017-01-26 MED ORDER — SODIUM CHLORIDE 0.9 % IV SOLN
1.3000 mg/m2 | Freq: Once | INTRAVENOUS | Status: AC
  Administered 2017-01-26: 2 mg via INTRAVENOUS
  Filled 2017-01-26: qty 4

## 2017-01-26 MED ORDER — SODIUM CHLORIDE 0.9 % IV SOLN
Freq: Once | INTRAVENOUS | Status: AC
  Administered 2017-01-26: 13:00:00 via INTRAVENOUS

## 2017-01-26 MED ORDER — NYSTATIN 100000 UNIT/ML MT SUSP: 5.0000 mL | Freq: Four times a day (QID) | OROMUCOSAL | 0 refills | Status: DC

## 2017-01-26 NOTE — Patient Instructions (Signed)
[place] Discharge Instructions for Patients Receiving Chemotherapy  Today you received the following chemotherapy agents: Halaven  To help prevent nausea and vomiting after your treatment, we encourage you to take your nausea medication as directed.    If you develop nausea and vomiting that is not controlled by your nausea medication, call the clinic.   BELOW ARE SYMPTOMS THAT SHOULD BE REPORTED IMMEDIATELY:  *FEVER GREATER THAN 100.5 F  *CHILLS WITH OR WITHOUT FEVER  NAUSEA AND VOMITING THAT IS NOT CONTROLLED WITH YOUR NAUSEA MEDICATION  *UNUSUAL SHORTNESS OF BREATH  *UNUSUAL BRUISING OR BLEEDING  TENDERNESS IN MOUTH AND THROAT WITH OR WITHOUT PRESENCE OF ULCERS  *URINARY PROBLEMS  *BOWEL PROBLEMS  UNUSUAL RASH Items with * indicate a potential emergency and should be followed up as soon as possible.  Feel free to call the clinic you have any questions or concerns. The clinic phone number is [phone].  Please show the [place] at check-in to the Emergency Department and triage nurse.

## 2017-01-26 NOTE — Addendum Note (Signed)
Addended by: Rea [employer] D on: 01/26/2017 01:50 PM   Modules accepted: Orders

## 2017-01-26 NOTE — Pre-Procedure Instructions (Signed)
Lesions

## 2017-01-29 ENCOUNTER — Ambulatory Visit: Payer: Medicare Other | Admitting: Gynecologic Oncology

## 2017-01-31 ENCOUNTER — Other Ambulatory Visit: Payer: Self-pay | Admitting: Hematology and Oncology

## 2017-01-31 DIAGNOSIS — R1030 Lower abdominal pain, unspecified: Secondary | ICD-10-CM

## 2017-02-01 NOTE — Progress Notes (Signed)
[facility] Follow up:    Paula Donna, MD (Inactive) No address on file   DIAGNOSIS: Cancer Staging Bilateral breast cancer [facility]) Staging form: Breast, AJCC 7th Edition - Clinical: Stage IIB (T2, N1, cM0) - Unsigned Staging comments: Staged at breast conference 08/13/13  - Pathologic: No stage assigned - Unsigned   SUMMARY OF ONCOLOGIC HISTORY:   Bilateral breast cancer ([place])   07/23/2013 Mammogram    Bilateral breast masses. 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 N1 M0 stage IIB ER 100%, PR 0%, HER-2       06/17/2014 -  Radiation Therapy    Adjuvant radiation therapy      09/14/2014 -  Anti-estrogen oral therapy    Anastrozole 1 mg daily      05/28/2015 Imaging    CT scans: Enlarging subpectoral masses 3.1 x 3.5 cm, posteriorly lower density mass 4.5 x 2.1 cm, several right-sided lung nodules right lower lobe 1.4 cm, 3 other right lung nodules, 1.7 cm right iliac bone lesion      06/21/2015 Procedure    Left subpectoral mass biopsy: Invasive high-grade ductal carcinoma ER 5%, PR 0%, HER-2 negative ratio 1.29      09/01/2015 Imaging    Left chest wall mass increased in size 7.2 x 5.1 cm, multiple subcutaneous nodules, increase in the lung nodules both in number as well as in the size of existing nodules      09/10/2015 -  Chemotherapy    Carboplatin, gemcitabine days 1 and 8 q 3 weeks, carboplatin discontinued for neuropathy (treatment break from 01/20/2016 to 03/24/2016); Added Carboplatin back 04/14/16 (for progression)      01/26/2016 Imaging    Marked improvement in size of lung nodules with many of the nodules resolved index right lower lobe nodule 1.9 x 1.8 cm is now 0.7 x 0.6 cm, reduction in the size of left breast mass 7.2 cm down to 2.6 cm, enlargement in the hypodense mass in the uterus      04/06/2016 Imaging    CT chest: Interval progression of pre-existing lung nodules new lung metastases (32m, 1102m 2.9 cm, 1.6 cm), interval progression of disease in the left breast 4.2 cm (was 2.6 cm), additional nodules 2.3 cm and 3.2 cm; CT head: scalp mass 1.9 cm      06/27/2016 Imaging    Ct chest: Stable lesions in lt breast  deep to Left pectoralis, cystic lesion loc ant in left breast inc compared to previous; decrease in lung nodules (70mm to 3 mm; 12 mm to 7 mm, RML nodule 2.9 cm to 1.8 cm, RLL 50mm to 7 mm)      11/14/2016 Imaging    CT CAP:RP and retrocrural lymphadenopathy, large uterine fibroids with necrosis; MRI Abd: RP lymphadenopathy CT chest 11/20/16: Worsening bulky conglomeration of mediastinal and right hilar lymph nodes, lung nodules subcentimeter size slight increase, left breast mass was 3.7 x 5.8 cm now 5.4 x 7.4 cm      12/08/2016 -  Chemotherapy    Palliative chemotherapy with Halaven days 1 and 8 every 3 weeks        CURRENT THERAPY: Halaven  INTERVAL HISTORY: Paula Navarro 66 y.o. female returns for evaluation prior to [facility]  Her  neuropathy has gotten so bad in her feet she has tripped twice, and has had some mild difficulty walking.  She has lost some weight.  She has remained active despite it all and continues to eat when she likes the food.  She recently went ot [facility] with her children and enjoyed it.     Patient Active Problem List   Diagnosis Date Noted  . Endometrial cancer (HCC) 12/18/2016  . Protein-calorie malnutrition, severe 11/15/2016  . UTI (urinary tract infection) 11/14/2016  . COPD (chronic obstructive pulmonary disease) (HCC)   . 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 Chemotherapy-induced thrombocytopenia 06/13/2016  . Scalp lesion 04/01/2016  . Encounter for central line care 12/08/2015  . Port catheter in place 11/19/2015  . Insomnia 11/01/2015  . Microcytic anemia 09/28/2015  . Lung metastases (HCC) 09/10/2015  . Bone metastases (HCC) 07/20/2015  . Goals of care, counseling/discussion 07/13/2015  . Metastatic breast cancer (HCC) 06/29/2015  . Chronic pain 03/16/2015  . Vaginal bleeding 09/24/2014  . Postherpetic neuralgia 09/03/2014  . Lymphedema 09/03/2014  . Suspected herpes zoster left C5 distribution 08/14/2014  . Neuropathy due to chemotherapeutic drug (HCC) 03/03/2014  . Anxiety 03/03/2014  . Bilateral breast cancer (HCC) 08/11/2013  . Palpitation   . Chest pain   . Atrial septal defect   . Laboratory test 11/25/2011  . Chronic bronchitis   . Tobacco abuse     is allergic to codeine and morphine and related.  MEDICAL HISTORY: Past Medical History:  Diagnosis Date  . Anemia   . Anxiety   . Atrial septal defect 1996   Surgical repair in 1996  . Breast cancer (HCC)   . Chest pain    Admitted to [facility] in 09/2011; refused stress test  . Chronic bronchitis   . Chronic pain   . COPD (chronic obstructive pulmonary disease) (HCC)    on xray  . Palpitation    Tachycardia reported by monitor clerk during a symptomatic spell  . Radiation 06/30/14-08/17/14    Bilateral Breast  . Tobacco abuse    60 pack years; 1.5 packs per day  . Wears dentures    top    SURGICAL HISTORY: Past Surgical History:  Procedure Laterality Date  . ASD REPAIR, OSTIUM PRIMUM  1996   dr Lowella Fairy  . AXILLARY LYMPH NODE DISSECTION Bilateral 04/08/2014   Procedure:  BILATERAL AXILLARY LYMPH NODE DISSECTION;  Surgeon: Chevis Pretty III, MD;  Location: [facility];  Service: General;  Laterality: Bilateral;  . BREAST BIOPSY Bilateral   . BREAST LUMPECTOMY WITH RADIOACTIVE SEED LOCALIZATION Bilateral 04/08/2014   Procedure: BILATERAL  RADIOACTIVE  SEED LOCALIZATION LUMPECTOMY ;  Surgeon: Autumn Messing III, MD;  Location: [place];  Service: General;  Laterality: Bilateral;  . CESAREAN SECTION     x3  . CHOLECYSTECTOMY    . open heart surgery    . PORT A CATH REVISION  1/15   put in   . TUBAL LIGATION      SOCIAL HISTORY: Social History   Social History  . Marital status: Widowed    Spouse name: N/A  . Number of children: N/A  . Years of education: N/A   Occupational History  . Not on file.   Social History Main Topics  . Smoking status: Former Smoker    Packs/day: 1.50    Years: 40.00    Types: Cigarettes    Quit date: 08/01/2012  . Smokeless tobacco: Never Used  . Alcohol use No  . Drug use: No  . Sexual activity: Not Currently   Other Topics Concern  . Not on file   Social History Narrative  . No narrative on file    FAMILY HISTORY: Family History  Problem Relation Age of Onset  . Breast cancer Maternal Aunt     Review of Systems  Constitutional: Negative for appetite change, chills, diaphoresis, fatigue, fever and unexpected weight change.  HENT:   Negative for hearing loss and lump/mass.   Eyes: Negative for eye problems and icterus.  Respiratory: Negative for chest tightness, cough and shortness of breath.   Cardiovascular: Negative for chest pain, leg swelling and palpitations.  Gastrointestinal: Negative for  abdominal distention and abdominal pain.  Endocrine: Negative for hot flashes.  Genitourinary: Negative for difficulty urinating and dyspareunia.   Musculoskeletal: Positive for gait problem.  Skin: Negative for itching.  Neurological: Positive for gait problem and numbness. Negative for dizziness and extremity weakness.  Hematological: Negative for adenopathy. Does not bruise/bleed easily.  Psychiatric/Behavioral: Negative for depression. The patient is not nervous/anxious.       PHYSICAL EXAMINATION  ECOG PERFORMANCE STATUS: 2 - Symptomatic, <50% confined to bed  Vitals:   02/02/17 1110  BP: (!) 141/79  Pulse: 80  Resp: 18  Temp: 98.1 F (36.7 C)    Physical Exam  Constitutional: She is oriented to person, place, and time and well-developed, well-nourished, and in no distress.  HENT:  Head: Normocephalic and atraumatic.  Mouth/Throat: Oropharynx is clear and moist. No oropharyngeal exudate.  Eyes: Pupils are equal, round, and reactive to light. No scleral icterus.  Neck: Neck supple.  Cardiovascular: Normal rate, regular rhythm, normal heart sounds and intact distal pulses.   Pulmonary/Chest: Effort normal and breath sounds normal. No respiratory distress. She has no wheezes.  Abdominal: Soft. Bowel sounds are normal. She exhibits no distension. There is no tenderness.  Musculoskeletal: She exhibits no edema.  Lymphadenopathy:    She has no cervical adenopathy.  Neurological: She is alert and oriented to person, place, and time.  Skin: Skin is warm and dry. No rash noted. No erythema.  Psychiatric: Mood and affect normal.    LABORATORY DATA:  CBC    Component Value Date/Time   WBC 5.2 02/02/2017 0948   WBC 12.3 (H) 11/20/2016 2100   RBC 4.72 02/02/2017 0948   RBC 3.48 (L) 11/20/2016 2100   HGB 9.9 (L) 02/02/2017 0948   HCT 31.6 (L) 02/02/2017 0948   PLT 263 02/02/2017 0948   MCV 67.0 (L) 02/02/2017 0948   MCH 20.9 (L) 02/02/2017 0948   MCH 22.4 (L) 11/20/2016  2100  MCHC 31.2 (L) 02/02/2017 0948   MCHC 31.5 11/20/2016 2100   RDW 22.7 (H) 02/02/2017 0948   LYMPHSABS 2.3 02/02/2017 0948   MONOABS 0.4 02/02/2017 0948   EOSABS 0.0 02/02/2017 0948   BASOSABS 0.0 02/02/2017 0948    CMP     Component Value Date/Time   NA 137 02/02/2017 0948   K 4.0 02/02/2017 0948   CL 99 (L) 11/20/2016 2100   CO2 28 02/02/2017 0948   GLUCOSE 147 (H) 02/02/2017 0948   BUN 12.2 02/02/2017 0948   CREATININE 0.8 02/02/2017 0948   CALCIUM 9.9 02/02/2017 0948   PROT 7.6 02/02/2017 0948   ALBUMIN 3.0 (L) 02/02/2017 0948   AST 52 (H) 02/02/2017 0948   ALT 26 02/02/2017 0948   ALKPHOS 67 02/02/2017 0948   BILITOT 0.44 02/02/2017 0948   GFRNONAA >60 11/20/2016 2100   GFRAA >60 11/20/2016 2100      ASSESSMENT and PLAN:   Bilateral breast cancer ([place]) Bilateral breast cancers are usually diagnosed 2015 treated with surgery, radiation, anastrozole Metastatic breast cancer diagnosed 05/28/2015 as subpectoral mass, ER 5%, PR 0%, HER-2 negative, failed Xeloda and carboplatin and gemcitabine  CT CAP04/17/2017:RP and retrocrural lymphadenopathy, large uterine fibroids with necrosis;  MRI Abd: RP lymphadenopathy CT chest 11/20/16: Worsening bulky conglomeration of mediastinal and right hilar lymph nodes, lung nodules subcentimeter size slight increase, left breast mass was 3.7 x 5.8 cm now 5.4 x 7.4 cm  Current treatment: 1. Eribulin days 1 and 8 every 3 weeks  Today is cycle 3 day 8.  Due to continued and worsening peripheral neuropathy, she will not receive treatment today.  I am very concerned about how progressive her neuropathy has become.  I informed her that she could lose the ability to walk, or use her fingers to button, or pick up things so neuropathy is a side effect that we need to take seriously.  She verbalized understanding.  She will return in 2 weeks for lab, appt with Dr. Lindi Adie.      All questions were answered. The patient knows to call the  clinic with any problems, questions or concerns. We can certainly see the patient much sooner if necessary.  A total of (30) minutes of face-to-face time was spent with this patient with greater than 50% of that time in counseling and care-coordination.  This note was electronically signed. Scot Dock, NP 02/02/2017

## 2017-02-01 NOTE — Assessment & Plan Note (Addendum)
Bilateral breast cancers are usually diagnosed 2015 treated with surgery, radiation, anastrozole Metastatic breast cancer diagnosed 05/28/2015 as subpectoral mass, ER 5%, PR 0%, HER-2 negative, failed Xeloda and carboplatin and gemcitabine  CT CAP04/17/2017:RP and retrocrural lymphadenopathy, large uterine fibroids with necrosis;  MRI Abd: RP lymphadenopathy CT chest 11/20/16: Worsening bulky conglomeration of mediastinal and right hilar lymph nodes, lung nodules subcentimeter size slight increase, left breast mass was 3.7 x 5.8 cm now 5.4 x 7.4 cm  Current treatment: 1. Eribulin days 1 and 8 every 3 weeks  Today is cycle 3 day 8.  Due to continued and worsening peripheral neuropathy, she will not receive treatment today.  I am very concerned about how progressive her neuropathy has become.  I informed her that she could lose the ability to walk, or use her fingers to button, or pick up things so neuropathy is a side effect that we need to take seriously.  She verbalized understanding.  She will return in 2 weeks for lab, appt with Dr. Lindi Adie.

## 2017-02-02 ENCOUNTER — Other Ambulatory Visit: Payer: Self-pay

## 2017-02-02 ENCOUNTER — Ambulatory Visit: Payer: Medicare Other

## 2017-02-02 ENCOUNTER — Ambulatory Visit (HOSPITAL_BASED_OUTPATIENT_CLINIC_OR_DEPARTMENT_OTHER): Payer: Medicare Other

## 2017-02-02 ENCOUNTER — Other Ambulatory Visit (HOSPITAL_BASED_OUTPATIENT_CLINIC_OR_DEPARTMENT_OTHER): Payer: Medicare Other

## 2017-02-02 ENCOUNTER — Encounter: Payer: Self-pay | Admitting: Adult Health

## 2017-02-02 ENCOUNTER — Ambulatory Visit (HOSPITAL_BASED_OUTPATIENT_CLINIC_OR_DEPARTMENT_OTHER): Payer: Medicare Other | Admitting: Adult Health

## 2017-02-02 VITALS — BP 141/79 | HR 80 | Temp 98.1°F | Resp 18 | Ht 62.0 in | Wt 115.1 lb

## 2017-02-02 DIAGNOSIS — C50011 Malignant neoplasm of nipple and areola, right female breast: Secondary | ICD-10-CM

## 2017-02-02 DIAGNOSIS — C7951 Secondary malignant neoplasm of bone: Secondary | ICD-10-CM | POA: Diagnosis not present

## 2017-02-02 DIAGNOSIS — C50912 Malignant neoplasm of unspecified site of left female breast: Secondary | ICD-10-CM

## 2017-02-02 DIAGNOSIS — C50919 Malignant neoplasm of unspecified site of unspecified female breast: Secondary | ICD-10-CM

## 2017-02-02 DIAGNOSIS — C50012 Malignant neoplasm of nipple and areola, left female breast: Secondary | ICD-10-CM | POA: Diagnosis not present

## 2017-02-02 DIAGNOSIS — C7800 Secondary malignant neoplasm of unspecified lung: Secondary | ICD-10-CM

## 2017-02-02 DIAGNOSIS — Z170 Estrogen receptor positive status [ER+]: Secondary | ICD-10-CM

## 2017-02-02 DIAGNOSIS — C50911 Malignant neoplasm of unspecified site of right female breast: Secondary | ICD-10-CM

## 2017-02-02 DIAGNOSIS — C7801 Secondary malignant neoplasm of right lung: Secondary | ICD-10-CM

## 2017-02-02 DIAGNOSIS — C770 Secondary and unspecified malignant neoplasm of lymph nodes of head, face and neck: Secondary | ICD-10-CM

## 2017-02-02 LAB — CBC WITH DIFFERENTIAL/PLATELET
BASO%: 0.9 % (ref 0.0–2.0)
BASOS ABS: 0 10*3/uL (ref 0.0–0.1)
EOS ABS: 0 10*3/uL (ref 0.0–0.5)
EOS%: 0.3 % (ref 0.0–7.0)
HCT: 31.6 % — ABNORMAL LOW (ref 34.8–46.6)
HEMOGLOBIN: 9.9 g/dL — AB (ref 11.6–15.9)
LYMPH%: 45 % (ref 14.0–49.7)
MCH: 20.9 pg — ABNORMAL LOW (ref 25.1–34.0)
MCHC: 31.2 g/dL — ABNORMAL LOW (ref 31.5–36.0)
MCV: 67 fL — ABNORMAL LOW (ref 79.5–101.0)
MONO#: 0.4 10*3/uL (ref 0.1–0.9)
MONO%: 6.9 % (ref 0.0–14.0)
NEUT#: 2.4 10*3/uL (ref 1.5–6.5)
NEUT%: 46.9 % (ref 38.4–76.8)
Platelets: 263 10*3/uL (ref 145–400)
RBC: 4.72 10*6/uL (ref 3.70–5.45)
RDW: 22.7 % — AB (ref 11.2–14.5)
WBC: 5.2 10*3/uL (ref 3.9–10.3)
lymph#: 2.3 10*3/uL (ref 0.9–3.3)

## 2017-02-02 LAB — COMPREHENSIVE METABOLIC PANEL
ALT: 26 U/L (ref 0–55)
AST: 52 U/L — AB (ref 5–34)
Albumin: 3 g/dL — ABNORMAL LOW (ref 3.5–5.0)
Alkaline Phosphatase: 67 U/L (ref 40–150)
Anion Gap: 7 mEq/L (ref 3–11)
BUN: 12.2 mg/dL (ref 7.0–26.0)
CHLORIDE: 102 meq/L (ref 98–109)
CO2: 28 meq/L (ref 22–29)
Calcium: 9.9 mg/dL (ref 8.4–10.4)
Creatinine: 0.8 mg/dL (ref 0.6–1.1)
GLUCOSE: 147 mg/dL — AB (ref 70–140)
POTASSIUM: 4 meq/L (ref 3.5–5.1)
SODIUM: 137 meq/L (ref 136–145)
Total Bilirubin: 0.44 mg/dL (ref 0.20–1.20)
Total Protein: 7.6 g/dL (ref 6.4–8.3)

## 2017-02-02 MED ORDER — SODIUM CHLORIDE 0.9% FLUSH
10.0000 mL | INTRAVENOUS | Status: DC | PRN
  Administered 2017-02-02: 10 mL via INTRAVENOUS
  Filled 2017-02-02: qty 10

## 2017-02-03 ENCOUNTER — Other Ambulatory Visit: Payer: Self-pay | Admitting: Hematology and Oncology

## 2017-02-05 ENCOUNTER — Ambulatory Visit (HOSPITAL_COMMUNITY)
Admission: RE | Admit: 2017-02-05 | Discharge: 2017-02-05 | Disposition: A | Discharge status: Home / Self Care | Payer: Medicare Other | Source: Ambulatory Visit | Attending: Hematology and Oncology | Admitting: Hematology and Oncology

## 2017-02-05 ENCOUNTER — Encounter (HOSPITAL_COMMUNITY): Payer: Self-pay

## 2017-02-05 ENCOUNTER — Ambulatory Visit: Payer: Medicare Other | Admitting: Gynecologic Oncology

## 2017-02-05 DIAGNOSIS — C779 Secondary and unspecified malignant neoplasm of lymph node, unspecified: Secondary | ICD-10-CM | POA: Insufficient documentation

## 2017-02-05 DIAGNOSIS — C50919 Malignant neoplasm of unspecified site of unspecified female breast: Secondary | ICD-10-CM | POA: Insufficient documentation

## 2017-02-05 DIAGNOSIS — D259 Leiomyoma of uterus, unspecified: Secondary | ICD-10-CM | POA: Insufficient documentation

## 2017-02-05 DIAGNOSIS — C7801 Secondary malignant neoplasm of right lung: Secondary | ICD-10-CM | POA: Insufficient documentation

## 2017-02-05 DIAGNOSIS — R590 Localized enlarged lymph nodes: Secondary | ICD-10-CM | POA: Diagnosis not present

## 2017-02-05 DIAGNOSIS — C770 Secondary and unspecified malignant neoplasm of lymph nodes of head, face and neck: Secondary | ICD-10-CM

## 2017-02-05 DIAGNOSIS — C50012 Malignant neoplasm of nipple and areola, left female breast: Secondary | ICD-10-CM

## 2017-02-05 DIAGNOSIS — C7951 Secondary malignant neoplasm of bone: Secondary | ICD-10-CM | POA: Insufficient documentation

## 2017-02-05 DIAGNOSIS — C50011 Malignant neoplasm of nipple and areola, right female breast: Secondary | ICD-10-CM

## 2017-02-05 HISTORY — DX: Secondary malignant neoplasm of unspecified lung: C78.00

## 2017-02-05 MED ORDER — HEPARIN SOD (PORK) LOCK FLUSH 100 UNIT/ML IV SOLN
500.0000 [IU] | Freq: Once | INTRAVENOUS | Status: AC
  Administered 2017-02-05: 500 [IU] via INTRAVENOUS

## 2017-02-05 MED ORDER — IOPAMIDOL (ISOVUE-300) INJECTION 61%
100.0000 mL | Freq: Once | INTRAVENOUS | Status: AC | PRN
  Administered 2017-02-05: 100 mL via INTRAVENOUS

## 2017-02-05 MED ORDER — HEPARIN SOD (PORK) LOCK FLUSH 100 UNIT/ML IV SOLN
INTRAVENOUS | Status: AC
  Filled 2017-02-05: qty 5

## 2017-02-05 MED ORDER — IOPAMIDOL (ISOVUE-300) INJECTION 61%
INTRAVENOUS | Status: AC
  Filled 2017-02-05: qty 100

## 2017-02-06 ENCOUNTER — Telehealth: Payer: Self-pay | Admitting: *Deleted

## 2017-02-06 NOTE — Telephone Encounter (Signed)
Attempted to contact the patient regarding her missed appt from Monday. Per Dr. Denman George, she will see the patient on Monday July 16th at 8:45am. Called and let the patient a message to cal the office back.

## 2017-02-07 ENCOUNTER — Telehealth: Payer: Self-pay | Admitting: Gynecologic Oncology

## 2017-02-07 ENCOUNTER — Telehealth: Payer: Self-pay

## 2017-02-07 NOTE — Telephone Encounter (Signed)
Spoke with patient.  She missed her pre-op appt on Monday.  She states she was told she did not have any other appts after her CT scan.  Dr. Denman George stated she could see her on Monday, the 16th, with plans for surgery on Tues.  The patient states that is her birthday and she didn't really want to have surgery around her birthday.  Informed her that this surgical date has been planned around her chemo and if she decided to wait we would have to look into August and plan around her chemo again.  She stated she will come in on Monday and to go ahead and schedule the surgery for Tues.  Advised her to call for any questions or concerns.  Will plan to get pre-op after her appt on Monday.

## 2017-02-07 NOTE — Telephone Encounter (Signed)
Pt calling to request that Dr.Gudena's office fax pt calendar appt to [facility] today in order have her transportation set for next week. Sent and faxed with confirmation.

## 2017-02-08 ENCOUNTER — Telehealth: Payer: Self-pay

## 2017-02-08 NOTE — Telephone Encounter (Signed)
Told son that this office is not able to reach his mother about appointments for 7--16-18. Appointment with Dr. Denman George at [phone]. Arrive at 0830. Pre op appointment is at 2 pm at [facility] hospital. Mr. Korte verbalized understanding.

## 2017-02-09 NOTE — Patient Instructions (Addendum)
Paula Navarro  02/09/2017   Your procedure is scheduled on: 02-13-17  Report to [facility] Main  Entrance Take [place]  elevators to 3rd floor to  [place] at [place].   Call this number if you have problems the morning of surgery [phone]    Remember: ONLY 1 PERSON MAY GO WITH YOU TO SHORT STAY TO GET  READY MORNING OF YOUR SURGERY.    Eat a light diet the day before surgery.  Examples including soups, broths, toast, yogurt, mashed potatoes.    Things to avoid include carbonated beverages (fizzy beverages), raw fruits and raw vegetables, or beans.   If your bowels are filled with gas, your surgeon will have difficulty visualizing your pelvic organs which increases your surgical risks  Do not eat food or drink liquids :After Midnight.     Take these medicines the morning of surgery with A SIP OF WATER: Gabapentin, Tramadol as needed                                You may not have any metal on your body including hair pins and              piercings  Do not wear jewelry, make-up, lotions, powders or perfumes, deodorant             Do not wear nail polish.  Do not shave  48 hours prior to surgery.         Do not bring valuables to the hospital. Paula Navarro.  Contacts, dentures or bridgework may not be worn into surgery.  Leave suitcase in the car. After surgery it may be brought to your room.               Please read over the following fact sheets you were given: _____________________________________________________________________        .[facility] - Preparing for Surgery Before surgery, you can play an important role.  Because skin is not sterile, your skin needs to be as free of germs as possible.  You can reduce the number of germs on your skin by washing with CHG (chlorahexidine gluconate) soap before surgery.  CHG is an antiseptic cleaner which kills germs and bonds with the skin to continue  killing germs even after washing. Please DO NOT use if you have an allergy to CHG or antibacterial soaps.  If your skin becomes reddened/irritated stop using the CHG and inform your nurse when you arrive at Short Stay. Do not shave (including legs and underarms) for at least 48 hours prior to the first CHG shower.  You may shave your face/neck. Please follow these instructions carefully:  1.  Shower with CHG Soap the night before surgery and the  morning of Surgery.  2.  If you choose to wash your hair, wash your hair first as usual with your  normal  shampoo.  3.  After you shampoo, rinse your hair and body thoroughly to remove the  shampoo.                           4.  Use CHG as you would any other liquid soap.  You can apply  chg directly  to the skin and wash                       Gently with a scrungie or clean washcloth.  5.  Apply the CHG Soap to your body ONLY FROM THE NECK DOWN.   Do not use on face/ open                           Wound or open sores. Avoid contact with eyes, ears mouth and genitals (private parts).                       Wash face,  Genitals (private parts) with your normal soap.             6.  Wash thoroughly, paying special attention to the area where your surgery  will be performed.  7.  Thoroughly rinse your body with warm water from the neck down.  8.  DO NOT shower/wash with your normal soap after using and rinsing off  the CHG Soap.                9.  Pat yourself dry with a clean towel.            10.  Wear clean pajamas.            11.  Place clean sheets on your bed the night of your first shower and do not  sleep with pets. Day of Surgery : Do not apply any lotions/deodorants the morning of surgery.  Please wear clean clothes to the hospital/surgery center.  FAILURE TO FOLLOW THESE INSTRUCTIONS MAY RESULT IN THE CANCELLATION OF YOUR SURGERY PATIENT SIGNATURE_________________________________  NURSE  SIGNATURE__________________________________  ________________________________________________________________________   Paula Navarro  An incentive spirometer is a tool that can help keep your lungs clear and active. This tool measures how well you are filling your lungs with each breath. Taking long deep breaths may help reverse or decrease the chance of developing breathing (pulmonary) problems (especially infection) following:  A long period of time when you are unable to move or be active. BEFORE THE PROCEDURE   If the spirometer includes an indicator to show your best effort, your nurse or respiratory therapist will set it to a desired goal.  If possible, sit up straight or lean slightly forward. Try not to slouch.  Hold the incentive spirometer in an upright position. INSTRUCTIONS FOR USE  1. Sit on the edge of your bed if possible, or sit up as far as you can in bed or on a chair. 2. Hold the incentive spirometer in an upright position. 3. Breathe out normally. 4. Place the mouthpiece in your mouth and seal your lips tightly around it. 5. Breathe in slowly and as deeply as possible, raising the piston or the ball toward the top of the column. 6. Hold your breath for 3-5 seconds or for as long as possible. Allow the piston or ball to fall to the bottom of the column. 7. Remove the mouthpiece from your mouth and breathe out normally. 8. Rest for a few seconds and repeat Steps 1 through 7 at least 10 times every 1-2 hours when you are awake. Take your time and take a few normal breaths between deep breaths. 9. The spirometer may include an indicator to show your best effort. Use the indicator as a goal to work toward during each repetition.  10. After each set of 10 deep breaths, practice coughing to be sure your lungs are clear. If you have an incision (the cut made at the time of surgery), support your incision when coughing by placing a pillow or rolled up towels firmly  against it. Once you are able to get out of bed, walk around indoors and cough well. You may stop using the incentive spirometer when instructed by your caregiver.  RISKS AND COMPLICATIONS  Take your time so you do not get dizzy or light-headed.  If you are in pain, you may need to take or ask for pain medication before doing incentive spirometry. It is harder to take a deep breath if you are having pain. AFTER USE  Rest and breathe slowly and easily.  It can be helpful to keep track of a log of your progress. Your caregiver can provide you with a simple table to help with this. If you are using the spirometer at home, follow these instructions: [place] IF:   You are having difficultly using the spirometer.  You have trouble using the spirometer as often as instructed.  Your pain medication is not giving enough relief while using the spirometer.  You develop fever of 100.5 F (38.1 C) or higher. SEEK IMMEDIATE MEDICAL CARE IF:   You cough up bloody sputum that had not been present before.  You develop fever of 102 F (38.9 C) or greater.  You develop worsening pain at or near the incision site. MAKE SURE YOU:   Understand these instructions.  Will watch your condition.  Will get help right away if you are not doing well or get worse. Document Released: 11/27/2006 Document Revised: 10/09/2011 Document Reviewed: 01/28/2007 ExitCare Patient Information 2014 ExitCare, [place].   ________________________________________________________________________  WHAT IS A BLOOD TRANSFUSION? Blood Transfusion Information  A transfusion is the replacement of blood or some of its parts. Blood is made up of multiple cells which provide different functions.  Red blood cells carry oxygen and are used for blood loss replacement.  White blood cells fight against infection.  Platelets control bleeding.  Plasma helps clot blood.  Other blood products are available for  specialized needs, such as hemophilia or other clotting disorders. BEFORE THE TRANSFUSION  Who gives blood for transfusions?   Healthy volunteers who are fully evaluated to make sure their blood is safe. This is blood bank blood. Transfusion therapy is the safest it has ever been in the practice of medicine. Before blood is taken from a donor, a complete history is taken to make sure that person has no history of diseases nor engages in risky social behavior (examples are intravenous drug use or sexual activity with multiple partners). The donor's travel history is screened to minimize risk of transmitting infections, such as malaria. The donated blood is tested for signs of infectious diseases, such as HIV and hepatitis. The blood is then tested to be sure it is compatible with you in order to minimize the chance of a transfusion reaction. If you or a relative donates blood, this is often done in anticipation of surgery and is not appropriate for emergency situations. It takes many days to process the donated blood. RISKS AND COMPLICATIONS Although transfusion therapy is very safe and saves many lives, the main dangers of transfusion include:   Getting an infectious disease.  Developing a transfusion reaction. This is an allergic reaction to something in the blood you were given. Every precaution is taken to prevent this. The  decision to have a blood transfusion has been considered carefully by your caregiver before blood is given. Blood is not given unless the benefits outweigh the risks. AFTER THE TRANSFUSION  Right after receiving a blood transfusion, you will usually feel much better and more energetic. This is especially true if your red blood cells have gotten low (anemic). The transfusion raises the level of the red blood cells which carry oxygen, and this usually causes an energy increase.  The nurse administering the transfusion will monitor you carefully for complications. HOME CARE  INSTRUCTIONS  No special instructions are needed after a transfusion. You may find your energy is better. Speak with your caregiver about any limitations on activity for underlying diseases you may have. SEEK MEDICAL CARE IF:   Your condition is not improving after your transfusion.  You develop redness or irritation at the intravenous (IV) site. SEEK IMMEDIATE MEDICAL CARE IF:  Any of the following symptoms occur over the next 12 hours:  Shaking chills.  You have a temperature by mouth above 102 F (38.9 C), not controlled by medicine.  Chest, back, or muscle pain.  People around you feel you are not acting correctly or are confused.  Shortness of breath or difficulty breathing.  Dizziness and fainting.  You get a rash or develop hives.  You have a decrease in urine output.  Your urine turns a dark color or changes to pink, red, or brown. Any of the following symptoms occur over the next 10 days:  You have a temperature by mouth above 102 F (38.9 C), not controlled by medicine.  Shortness of breath.  Weakness after normal activity.  The white part of the eye turns yellow (jaundice).  You have a decrease in the amount of urine or are urinating less often.  Your urine turns a dark color or changes to pink, red, or brown. Document Released: 07/14/2000 Document Revised: 10/09/2011 Document Reviewed: 03/02/2008 [facility] Patient Information 2014 [place], [place].  _______________________________________________________________________

## 2017-02-09 NOTE — Progress Notes (Signed)
EKG 11-21-16 epic  CT chest 02-05-17 epic  CBCdiff, CMP 02-02-17 epic  ECHO 08-22-13 epic ; reads "Aortic valve:  Trileaflet; mildly thickened, mildly calcified leaflets. Mobility was not restricted. Doppler: Transvalvular velocity was within the normal range. There was no stenosis. Mild to moderate regurgitation."

## 2017-02-12 ENCOUNTER — Encounter: Payer: Self-pay | Admitting: Gynecologic Oncology

## 2017-02-12 ENCOUNTER — Ambulatory Visit (HOSPITAL_BASED_OUTPATIENT_CLINIC_OR_DEPARTMENT_OTHER): Payer: Medicare Other | Admitting: Gynecologic Oncology

## 2017-02-12 ENCOUNTER — Encounter (HOSPITAL_COMMUNITY)
Admission: RE | Admit: 2017-02-12 | Discharge: 2017-02-12 | Disposition: A | Discharge status: Home / Self Care | Payer: Medicare Other | Source: Ambulatory Visit | Attending: Gynecologic Oncology | Admitting: Gynecologic Oncology

## 2017-02-12 ENCOUNTER — Encounter (HOSPITAL_COMMUNITY): Payer: Self-pay

## 2017-02-12 VITALS — BP 124/92 | HR 99 | Temp 98.0°F | Resp 18 | Wt 114.7 lb

## 2017-02-12 DIAGNOSIS — G8929 Other chronic pain: Secondary | ICD-10-CM | POA: Diagnosis not present

## 2017-02-12 DIAGNOSIS — C50911 Malignant neoplasm of unspecified site of right female breast: Secondary | ICD-10-CM | POA: Diagnosis not present

## 2017-02-12 DIAGNOSIS — C7801 Secondary malignant neoplasm of right lung: Secondary | ICD-10-CM | POA: Diagnosis not present

## 2017-02-12 DIAGNOSIS — Z95828 Presence of other vascular implants and grafts: Secondary | ICD-10-CM | POA: Diagnosis not present

## 2017-02-12 DIAGNOSIS — C50919 Malignant neoplasm of unspecified site of unspecified female breast: Secondary | ICD-10-CM

## 2017-02-12 DIAGNOSIS — C50912 Malignant neoplasm of unspecified site of left female breast: Secondary | ICD-10-CM | POA: Diagnosis not present

## 2017-02-12 DIAGNOSIS — F419 Anxiety disorder, unspecified: Secondary | ICD-10-CM | POA: Diagnosis not present

## 2017-02-12 DIAGNOSIS — C541 Malignant neoplasm of endometrium: Secondary | ICD-10-CM | POA: Diagnosis present

## 2017-02-12 DIAGNOSIS — J449 Chronic obstructive pulmonary disease, unspecified: Secondary | ICD-10-CM | POA: Diagnosis not present

## 2017-02-12 DIAGNOSIS — Z87891 Personal history of nicotine dependence: Secondary | ICD-10-CM | POA: Diagnosis not present

## 2017-02-12 DIAGNOSIS — C7951 Secondary malignant neoplasm of bone: Secondary | ICD-10-CM | POA: Diagnosis not present

## 2017-02-12 DIAGNOSIS — N939 Abnormal uterine and vaginal bleeding, unspecified: Secondary | ICD-10-CM | POA: Diagnosis not present

## 2017-02-12 DIAGNOSIS — C7802 Secondary malignant neoplasm of left lung: Secondary | ICD-10-CM | POA: Diagnosis not present

## 2017-02-12 DIAGNOSIS — Z885 Allergy status to narcotic agent status: Secondary | ICD-10-CM | POA: Diagnosis not present

## 2017-02-12 DIAGNOSIS — Z79899 Other long term (current) drug therapy: Secondary | ICD-10-CM | POA: Diagnosis not present

## 2017-02-12 DIAGNOSIS — C7989 Secondary malignant neoplasm of other specified sites: Secondary | ICD-10-CM | POA: Diagnosis not present

## 2017-02-12 DIAGNOSIS — Z923 Personal history of irradiation: Secondary | ICD-10-CM | POA: Diagnosis not present

## 2017-02-12 DIAGNOSIS — C7982 Secondary malignant neoplasm of genital organs: Secondary | ICD-10-CM | POA: Diagnosis not present

## 2017-02-12 LAB — CBC
HCT: 33 % — ABNORMAL LOW (ref 36.0–46.0)
HEMOGLOBIN: 10.4 g/dL — AB (ref 12.0–15.0)
MCH: 20.8 pg — AB (ref 26.0–34.0)
MCHC: 31.5 g/dL (ref 30.0–36.0)
MCV: 65.9 fL — AB (ref 78.0–100.0)
Platelets: 290 10*3/uL (ref 150–400)
RBC: 5.01 MIL/uL (ref 3.87–5.11)
RDW: 21.1 % — ABNORMAL HIGH (ref 11.5–15.5)
WBC: 8.9 10*3/uL (ref 4.0–10.5)

## 2017-02-12 LAB — URINALYSIS, ROUTINE W REFLEX MICROSCOPIC
BACTERIA UA: NONE SEEN
BILIRUBIN URINE: NEGATIVE
GLUCOSE, UA: NEGATIVE mg/dL
KETONES UR: NEGATIVE mg/dL
LEUKOCYTES UA: NEGATIVE
NITRITE: NEGATIVE
PH: 6 (ref 5.0–8.0)
PROTEIN: NEGATIVE mg/dL
Specific Gravity, Urine: 1.012 (ref 1.005–1.030)

## 2017-02-12 NOTE — Progress Notes (Signed)
Consult Note: Gyn-Onc  Consult was requested by Dr. Sonny Dandy and Dr Garwin Brothers for the evaluation of Paula Navarro 66 y.o. female  CC:  Chief Complaint  Patient presents with  . Endometrial Cancer    Assessment/Plan:  Paula Navarro  is a 66 y.o.  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   The prognosis from her breast cancer is poor, and therapeutic intent is palliative, however, she is continuing to have response at her metastaic sites to [place]. Therefore, we will proceed with a palliative hysterectomy/BSO. I believe a robotic approach is feasible, though she will require a minilaparotomy for specimen delivery due to the bulky size of the uterus. Unfortunately this would involve a delay in her chemotherapy for approximately 3-4 weeks while she heals.  She is >2 weeks s/p last dose of therapy.  We will check labs today to ensure normal bone marrow function.  I reviewed surgical risks with her including  bleeding, infection, damage to internal organs (such as bladder,ureters, bowels), blood clot, reoperation and rehospitalization. I reviewed anticipated postop recovery and hospital stay.  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  The patient has a history of bilatera breast cancer diagnosed in January 2015 and treated with neoadjuvant chemotherapy followed by surgery and adjuvant radiation therapy followed by anti-estrogen oral therapy. Recurrence (left chest wall, lungs, bone) was diagnosed in November, 2016. From February, 2017 until June, 2017 she received salvage chemotherapy with carboplatin and gemcitabine with improvement in her metastatic sites. In June, 2017 she was noted to  have thickened endometrium on imaging. She declined surgical intervention at the time as she was concerned about caring for her ailing mother. Subsequently a scalp mass developed as an additional site of progession and new progression developedi n the lungs and left chest wall.  She received no intervention for many months as she was caring for her mother.  A CT abdo/pelvis on 11/14/16 showed an enlarged uterus (12cm) with bulky intrauterine mass and worsening conglomeration of mediastinal and right hilar nodes. THere was also bulky and necrotic retroperitoneal adenopathy encasing the venacava.  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   She was started on [place] palliative chemotherapy on 12/08/16.   Interval History: she reports persistent and bothersome vaginal bleeding. The flow is less than a menstrual period. She denies pelvic pain. She has not required blood transfusion.  She has gone on to receive 3 cycles of [place] with day 1 of cycle 3 on 01/26/17.   CT abd/pelvis/chest on 02/05/17 showed reduction in size of breast cancer metastatic disease. Stable appearance of lymphadenopathy ( which is likely secondary to metastatic carcinosarcoma).   Current Meds:  Outpatient Encounter Prescriptions as of 02/12/2017  Medication Sig  . B Complex-C (SUPER B COMPLEX PO) Take 1 tablet by mouth daily.  . calcium-vitamin D (OSCAL-500) 500-400 MG-UNIT tablet Take 1 tablet by mouth 2 (two) times daily. (Patient taking differently: Take 1 tablet by mouth 2 (two) times daily. Pt takes once daily.)  . dicyclomine (BENTYL) 10 MG capsule TAKE 1 CAPSULE (10 MG TOTAL) BY MOUTH 3 (  THREE) TIMES DAILY AS NEEDED FOR SPASMS.  Marland Kitchen gabapentin (NEURONTIN) 300 MG capsule TAKE 2 CAPSULES (600 MG TOTAL) BY MOUTH 3 (THREE) TIMES DAILY.  Marland Kitchen lidocaine-prilocaine (EMLA)  cream Apply to affected area once  . LORazepam (ATIVAN) 1 MG tablet TAKE 1 TABLET BY MOUTH EVERYDAY AT BEDTIME  . nystatin (MYCOSTATIN) 100000 UNIT/ML suspension Take 5 mLs (500,000 Units total) by mouth 4 (four) times daily.  . ondansetron (ZOFRAN) 8 MG tablet Take 1 tablet (8 mg total) by mouth 2 (two) times daily as needed (Nausea or vomiting).  Marland Kitchen oxyCODONE (OXY IR/ROXICODONE) 5 MG immediate release tablet Take 1 tablet (5 mg total) by mouth every 6 (six) hours as needed for severe pain.  . polyethylene glycol (MIRALAX / GLYCOLAX) packet Take 17 g by mouth 2 (two) times daily.  . prochlorperazine (COMPAZINE) 10 MG tablet Take 1 tablet (10 mg total) by mouth every 6 (six) hours as needed (Nausea or vomiting).  . traMADol (ULTRAM) 50 MG tablet TAKE 1 TABLET BY MOUTH EVERY 6 HOURS AS NEEDED  . zolpidem (AMBIEN) 10 MG tablet Take 1 tablet (10 mg total) by mouth at bedtime.  . Clobetasol Prop Emollient Base (CLOBETASOL PROPIONATE E) 0.05 % emollient cream Apply 1 application topically 2 (two) times daily.   No facility-administered encounter medications on file as of 02/12/2017.     Allergy:  Allergies  Allergen Reactions  . Codeine Other (See Comments)    Hot in chest  . Morphine And Related Itching and Rash    Social Hx:   Social History   Social History  . Marital status: Widowed    Spouse name: N/A  . Number of children: N/A  . Years of education: N/A   Occupational History  . Not on file.   Social History Main Topics  . Smoking status: Former Smoker    Packs/day: 1.50    Years: 40.00    Types: Cigarettes    Quit date: 08/01/2012  . Smokeless tobacco: Never Used  . Alcohol use No  . Drug use: No  . Sexual activity: Not Currently   Other Topics Concern  . Not on file   Social History Narrative  . No narrative on file    Past Surgical Hx:  Past Surgical History:  Procedure Laterality Date  . ASD REPAIR, OSTIUM PRIMUM  1996   dr Roxy Horseman  . AXILLARY LYMPH NODE  DISSECTION Bilateral 04/08/2014   Procedure:  BILATERAL AXILLARY LYMPH NODE DISSECTION;  Surgeon: Autumn Messing III, MD;  Location: [facility];  Service: General;  Laterality: Bilateral;  . BREAST BIOPSY Bilateral   . BREAST LUMPECTOMY WITH RADIOACTIVE SEED LOCALIZATION Bilateral 04/08/2014   Procedure: BILATERAL  RADIOACTIVE SEED LOCALIZATION LUMPECTOMY ;  Surgeon: Autumn Messing III, MD;  Location: [facility];  Service: General;  Laterality: Bilateral;  . CESAREAN SECTION     x3  . CHOLECYSTECTOMY    . open heart surgery    . PORT A CATH REVISION  1/15   put in   . TUBAL LIGATION      Past Medical Hx:  Past Medical History:  Diagnosis Date  . Anemia   . Anxiety   . Atrial septal defect 1996   Surgical repair in 1996  . Breast cancer ([place]) dx'd 06/2013  . Chest pain    Admitted to [facility] in 09/2011; refused stress test  . Chronic bronchitis   . Chronic pain   . COPD (chronic obstructive pulmonary disease) ([place])  on xray  . Metastatic cancer to lung ([place]) dx'd 2017  . Palpitation    Tachycardia reported by monitor clerk during a symptomatic spell  . Radiation 06/30/14-08/17/14   Bilateral Breast  . Tobacco abuse    60 pack years; 1.5 packs per day  . Wears dentures    top    Past Gynecological History:  C/s x 3 Patient's last menstrual period was 05/20/2011.  Family Hx:  Family History  Problem Relation Age of Onset  . Breast cancer Maternal Aunt     Review of Systems:  Constitutional  Feels well,    ENT Normal appearing ears and nares bilaterally Skin/Breast  + scalp and left chest wall masses Cardiovascular  No chest pain, shortness of breath, or edema  Pulmonary  No cough or wheeze.  Gastro Intestinal  No nausea, vomitting, or diarrhoea. No bright red blood per rectum, no abdominal pain, change in bowel movement, or constipation.  Genito Urinary  No frequency, urgency, dysuria, + bleeding Musculo Skeletal  No myalgia, arthralgia, joint  swelling or pain  Neurologic  No weakness, numbness, change in gait,  Psychology  No depression, anxiety, insomnia.   Vitals:  Blood pressure (!) 124/92, pulse 99, temperature 98 F (36.7 C), resp. rate 18, weight 114 lb 11.2 oz (52 kg), last menstrual period 05/20/2011, SpO2 99 %.  Physical Exam: WD in NAD Neck  Supple NROM, without any enlargements.  Lymph Node Survey + left axillary adenopathy. Cardiovascular  Pulse normal rate, regularity and rhythm. S1 and S2 normal.  Lungs  Clear to auscultation bilateraly, without wheezes/crackles/rhonchi. Good air movement.  Skin  + 15cm left anterior upper chest wall mass pointing (almost erupting through) to the skin with induration spreading down the chest wall. Psychiatry  Alert and oriented to person, place, and time  Abdomen  Normoactive bowel sounds, abdomen soft, non-tender and nonobese without evidence of hernia. Vertical midline and subcostal incisions. Back No CVA tenderness Genito Urinary  Vulva/vagina: Normal external female genitalia.  No lesions. No discharge or bleeding.  Bladder/urethra:  No lesions or masses, well supported bladder  Vagina: normal  Cervix: Normal appearing, no lesions.  Uterus: mobile, bulky at 12cm but with no parametrial involvement or nodularity.  Adnexa: no discrete masses. Rectal  deferred  Extremities  No bilateral cyanosis, clubbing or edema.   [name], MD  02/12/2017, 9:24 AM

## 2017-02-12 NOTE — Progress Notes (Signed)
02-12-17 UA results routed to Dr. Denman George for review.

## 2017-02-12 NOTE — Patient Instructions (Signed)
Preparing for your Surgery  Plan for surgery on July 17 with Dr. Everitt Amber at [facility].  Pre-operative [place] will receive a phone call from presurgical testing at [facility] to arrange for a pre-operative testing appointment before your surgery.  This appointment normally occurs one to two weeks before your scheduled surgery.   -Bring your insurance card, copy of an advanced directive if applicable, medication list  -At that visit, you will be asked to sign a consent for a possible blood transfusion in case a transfusion becomes necessary during surgery.  The need for a blood transfusion is rare but having consent is a necessary part of your care.     -You should not be taking blood thinners or aspirin at least ten days prior to surgery unless instructed by your surgeon.  Day Before Surgery at [place] will be asked to take in a light diet the day before surgery.  Avoid carbonated beverages.  You will be advised to have nothing to eat or drink after midnight the evening before.     Eat a light diet the day before surgery.  Examples including soups, broths, toast, yogurt, mashed potatoes.  Things to avoid include carbonated beverages (fizzy beverages), raw fruits and raw vegetables, or beans.    If your bowels are filled with gas, your surgeon will have difficulty visualizing your pelvic organs which increases your surgical risks.  Your role in recovery Your role is to become active as soon as directed by your doctor, while still giving yourself time to heal.  Rest when you feel tired. You will be asked to do the following in order to speed your recovery:  - Cough and breathe deeply. This helps toclear and expand your lungs and can prevent pneumonia. You may be given a spirometer to practice deep breathing. A staff member will show you how to use the spirometer. - Do mild physical activity. Walking or moving your legs help your circulation and body  functions return to normal. A staff member will help you when you try to walk and will provide you with simple exercises. Do not try to get up or walk alone the first time. - Actively manage your pain. Managing your pain lets you move in comfort. We will ask you to rate your pain on a scale of zero to 10. It is your responsibility to tell your doctor or nurse where and how much you hurt so your pain can be treated.  Special Considerations -If you are diabetic, you may be placed on insulin after surgery to have closer control over your blood sugars to promote healing and recovery.  This does not mean that you will be discharged on insulin.  If applicable, your oral antidiabetics will be resumed when you are tolerating a solid diet.  -Your final pathology results from surgery should be available by the Friday after surgery and the results will be relayed to you when available.   Blood Transfusion Information WHAT IS A BLOOD TRANSFUSION? A transfusion is the replacement of blood or some of its parts. Blood is made up of multiple cells which provide different functions.  Red blood cells carry oxygen and are used for blood loss replacement.  White blood cells fight against infection.  Platelets control bleeding.  Plasma helps clot blood.  Other blood products are available for specialized needs, such as hemophilia or other clotting disorders. BEFORE THE TRANSFUSION  Who gives blood for transfusions?   You may be  able to donate blood to be used at a later date on yourself (autologous donation).  Relatives can be asked to donate blood. This is generally not any safer than if you have received blood from a stranger. The same precautions are taken to ensure safety when a relative's blood is donated.  Healthy volunteers who are fully evaluated to make sure their blood is safe. This is blood bank blood. Transfusion therapy is the safest it has ever been in the practice of medicine. Before blood is  taken from a donor, a complete history is taken to make sure that person has no history of diseases nor engages in risky social behavior (examples are intravenous drug use or sexual activity with multiple partners). The donor's travel history is screened to minimize risk of transmitting infections, such as malaria. The donated blood is tested for signs of infectious diseases, such as HIV and hepatitis. The blood is then tested to be sure it is compatible with you in order to minimize the chance of a transfusion reaction. If you or a relative donates blood, this is often done in anticipation of surgery and is not appropriate for emergency situations. It takes many days to process the donated blood. RISKS AND COMPLICATIONS Although transfusion therapy is very safe and saves many lives, the main dangers of transfusion include:   Getting an infectious disease.  Developing a transfusion reaction. This is an allergic reaction to something in the blood you were given. Every precaution is taken to prevent this. The decision to have a blood transfusion has been considered carefully by your caregiver before blood is given. Blood is not given unless the benefits outweigh the risks.

## 2017-02-13 ENCOUNTER — Encounter (HOSPITAL_COMMUNITY): Payer: Self-pay | Admitting: *Deleted

## 2017-02-13 ENCOUNTER — Inpatient Hospital Stay (HOSPITAL_COMMUNITY): Payer: Medicare Other | Admitting: Certified Registered Nurse Anesthetist

## 2017-02-13 ENCOUNTER — Ambulatory Visit (HOSPITAL_COMMUNITY)
Admission: RE | Admit: 2017-02-13 | Discharge: 2017-02-14 | Disposition: A | Discharge status: Home / Self Care | Payer: Medicare Other | Source: Ambulatory Visit | Attending: Gynecologic Oncology | Admitting: Gynecologic Oncology

## 2017-02-13 ENCOUNTER — Encounter (HOSPITAL_COMMUNITY)
Admission: RE | Disposition: A | Discharge status: Home / Self Care | Payer: Self-pay | Source: Ambulatory Visit | Attending: Gynecologic Oncology

## 2017-02-13 DIAGNOSIS — C7989 Secondary malignant neoplasm of other specified sites: Secondary | ICD-10-CM | POA: Insufficient documentation

## 2017-02-13 DIAGNOSIS — Z853 Personal history of malignant neoplasm of breast: Secondary | ICD-10-CM

## 2017-02-13 DIAGNOSIS — C7951 Secondary malignant neoplasm of bone: Secondary | ICD-10-CM | POA: Insufficient documentation

## 2017-02-13 DIAGNOSIS — C50911 Malignant neoplasm of unspecified site of right female breast: Secondary | ICD-10-CM | POA: Diagnosis not present

## 2017-02-13 DIAGNOSIS — Z923 Personal history of irradiation: Secondary | ICD-10-CM | POA: Insufficient documentation

## 2017-02-13 DIAGNOSIS — Z95828 Presence of other vascular implants and grafts: Secondary | ICD-10-CM | POA: Insufficient documentation

## 2017-02-13 DIAGNOSIS — C50919 Malignant neoplasm of unspecified site of unspecified female breast: Secondary | ICD-10-CM | POA: Diagnosis present

## 2017-02-13 DIAGNOSIS — G8929 Other chronic pain: Secondary | ICD-10-CM | POA: Insufficient documentation

## 2017-02-13 DIAGNOSIS — J449 Chronic obstructive pulmonary disease, unspecified: Secondary | ICD-10-CM | POA: Insufficient documentation

## 2017-02-13 DIAGNOSIS — C55 Malignant neoplasm of uterus, part unspecified: Secondary | ICD-10-CM

## 2017-02-13 DIAGNOSIS — Z87891 Personal history of nicotine dependence: Secondary | ICD-10-CM | POA: Insufficient documentation

## 2017-02-13 DIAGNOSIS — Z885 Allergy status to narcotic agent status: Secondary | ICD-10-CM | POA: Insufficient documentation

## 2017-02-13 DIAGNOSIS — Z79899 Other long term (current) drug therapy: Secondary | ICD-10-CM | POA: Insufficient documentation

## 2017-02-13 DIAGNOSIS — C541 Malignant neoplasm of endometrium: Secondary | ICD-10-CM | POA: Diagnosis present

## 2017-02-13 DIAGNOSIS — C50912 Malignant neoplasm of unspecified site of left female breast: Secondary | ICD-10-CM | POA: Insufficient documentation

## 2017-02-13 DIAGNOSIS — C7982 Secondary malignant neoplasm of genital organs: Principal | ICD-10-CM | POA: Insufficient documentation

## 2017-02-13 DIAGNOSIS — F419 Anxiety disorder, unspecified: Secondary | ICD-10-CM | POA: Insufficient documentation

## 2017-02-13 DIAGNOSIS — C7802 Secondary malignant neoplasm of left lung: Secondary | ICD-10-CM | POA: Insufficient documentation

## 2017-02-13 DIAGNOSIS — N939 Abnormal uterine and vaginal bleeding, unspecified: Secondary | ICD-10-CM | POA: Insufficient documentation

## 2017-02-13 DIAGNOSIS — C7801 Secondary malignant neoplasm of right lung: Secondary | ICD-10-CM | POA: Insufficient documentation

## 2017-02-13 HISTORY — PX: LAPAROTOMY: SHX154

## 2017-02-13 HISTORY — PX: ROBOTIC ASSISTED TOTAL HYSTERECTOMY WITH BILATERAL SALPINGO OOPHERECTOMY: SHX6086

## 2017-02-13 SURGERY — HYSTERECTOMY, TOTAL, ROBOT-ASSISTED, LAPAROSCOPIC, WITH BILATERAL SALPINGO-OOPHORECTOMY
Anesthesia: General | Site: Abdomen

## 2017-02-13 MED ORDER — MIDAZOLAM HCL 2 MG/2ML IJ SOLN
INTRAMUSCULAR | Status: AC
  Filled 2017-02-13: qty 2

## 2017-02-13 MED ORDER — LACTATED RINGERS IR SOLN
Status: DC | PRN
  Administered 2017-02-13: 1000 mL

## 2017-02-13 MED ORDER — ONDANSETRON HCL 4 MG/2ML IJ SOLN
INTRAMUSCULAR | Status: DC | PRN
  Administered 2017-02-13 (×2): 2 mg via INTRAVENOUS

## 2017-02-13 MED ORDER — FENTANYL CITRATE (PF) 100 MCG/2ML IJ SOLN
25.0000 ug | INTRAMUSCULAR | Status: DC | PRN
  Administered 2017-02-13: 25 ug via INTRAVENOUS

## 2017-02-13 MED ORDER — ZOLPIDEM TARTRATE 5 MG PO TABS
5.0000 mg | ORAL_TABLET | Freq: Every evening | ORAL | Status: DC | PRN
  Administered 2017-02-13: 5 mg via ORAL
  Filled 2017-02-13: qty 1

## 2017-02-13 MED ORDER — FENTANYL CITRATE (PF) 100 MCG/2ML IJ SOLN: 25.0000 ug | INTRAMUSCULAR | Status: DC | PRN

## 2017-02-13 MED ORDER — ROCURONIUM BROMIDE 100 MG/10ML IV SOLN
INTRAVENOUS | Status: DC | PRN
  Administered 2017-02-13: 50 mg via INTRAVENOUS
  Administered 2017-02-13: 30 mg via INTRAVENOUS

## 2017-02-13 MED ORDER — KCL IN DEXTROSE-NACL 20-5-0.45 MEQ/L-%-% IV SOLN
INTRAVENOUS | Status: DC
  Administered 2017-02-13: 17:00:00 via INTRAVENOUS
  Filled 2017-02-13 (×3): qty 1000

## 2017-02-13 MED ORDER — DEXAMETHASONE SODIUM PHOSPHATE 10 MG/ML IJ SOLN
INTRAMUSCULAR | Status: AC
  Filled 2017-02-13: qty 1

## 2017-02-13 MED ORDER — ONDANSETRON HCL 4 MG/2ML IJ SOLN: 4.0000 mg | Freq: Four times a day (QID) | INTRAMUSCULAR | Status: DC | PRN

## 2017-02-13 MED ORDER — PROPOFOL 10 MG/ML IV BOLUS
INTRAVENOUS | Status: AC
  Filled 2017-02-13: qty 20

## 2017-02-13 MED ORDER — ROCURONIUM BROMIDE 50 MG/5ML IV SOSY
PREFILLED_SYRINGE | INTRAVENOUS | Status: AC
  Filled 2017-02-13: qty 5

## 2017-02-13 MED ORDER — OXYCODONE-ACETAMINOPHEN 5-325 MG PO TABS
1.0000 | ORAL_TABLET | ORAL | Status: DC | PRN
  Filled 2017-02-13: qty 1

## 2017-02-13 MED ORDER — EPHEDRINE 5 MG/ML INJ
INTRAVENOUS | Status: AC
  Filled 2017-02-13: qty 10

## 2017-02-13 MED ORDER — LIDOCAINE HCL (CARDIAC) 20 MG/ML IV SOLN
INTRAVENOUS | Status: DC | PRN
  Administered 2017-02-13: 80 mg via INTRAVENOUS
  Administered 2017-02-13: 40 mg via INTRAVENOUS

## 2017-02-13 MED ORDER — NEOSTIGMINE METHYLSULFATE 5 MG/5ML IV SOSY
PREFILLED_SYRINGE | INTRAVENOUS | Status: AC
  Filled 2017-02-13: qty 5

## 2017-02-13 MED ORDER — FENTANYL CITRATE (PF) 100 MCG/2ML IJ SOLN
25.0000 ug | INTRAMUSCULAR | Status: DC | PRN
  Administered 2017-02-13 (×2): 50 ug via INTRAVENOUS

## 2017-02-13 MED ORDER — BUPIVACAINE LIPOSOME 1.3 % IJ SUSP
INTRAMUSCULAR | Status: DC | PRN
  Administered 2017-02-13: 20 mL

## 2017-02-13 MED ORDER — IBUPROFEN 600 MG PO TABS
600.0000 mg | ORAL_TABLET | Freq: Three times a day (TID) | ORAL | Status: DC | PRN
  Administered 2017-02-13: 200 mg via ORAL
  Filled 2017-02-13: qty 1
  Filled 2017-02-13: qty 3

## 2017-02-13 MED ORDER — BUPIVACAINE HCL (PF) 0.25 % IJ SOLN
INTRAMUSCULAR | Status: AC
  Filled 2017-02-13: qty 30

## 2017-02-13 MED ORDER — ARTIFICIAL TEARS OPHTHALMIC OINT
TOPICAL_OINTMENT | OPHTHALMIC | Status: AC
  Filled 2017-02-13: qty 3.5

## 2017-02-13 MED ORDER — ONDANSETRON HCL 4 MG/2ML IJ SOLN
INTRAMUSCULAR | Status: AC
  Filled 2017-02-13: qty 2

## 2017-02-13 MED ORDER — GABAPENTIN 300 MG PO CAPS
300.0000 mg | ORAL_CAPSULE | Freq: Three times a day (TID) | ORAL | Status: DC
  Administered 2017-02-13 – 2017-02-14 (×3): 300 mg via ORAL
  Filled 2017-02-13 (×3): qty 1

## 2017-02-13 MED ORDER — CEFAZOLIN SODIUM-DEXTROSE 2-4 GM/100ML-% IV SOLN
2.0000 g | INTRAVENOUS | Status: AC
  Administered 2017-02-13: 2 g via INTRAVENOUS
  Filled 2017-02-13: qty 100

## 2017-02-13 MED ORDER — HYDROMORPHONE HCL-NACL 0.5-0.9 MG/ML-% IV SOSY
PREFILLED_SYRINGE | INTRAVENOUS | Status: AC
  Filled 2017-02-13: qty 2

## 2017-02-13 MED ORDER — LIDOCAINE 2% (20 MG/ML) 5 ML SYRINGE
INTRAMUSCULAR | Status: AC
  Filled 2017-02-13: qty 5

## 2017-02-13 MED ORDER — EPHEDRINE SULFATE 50 MG/ML IJ SOLN
INTRAMUSCULAR | Status: DC | PRN
  Administered 2017-02-13: 5 mg via INTRAVENOUS

## 2017-02-13 MED ORDER — DEXAMETHASONE SODIUM PHOSPHATE 10 MG/ML IJ SOLN
INTRAMUSCULAR | Status: DC | PRN
  Administered 2017-02-13: 10 mg via INTRAVENOUS

## 2017-02-13 MED ORDER — PROPOFOL 10 MG/ML IV BOLUS
INTRAVENOUS | Status: DC | PRN
  Administered 2017-02-13: 25 mg via INTRAVENOUS
  Administered 2017-02-13: 100 mg via INTRAVENOUS

## 2017-02-13 MED ORDER — FENTANYL CITRATE (PF) 100 MCG/2ML IJ SOLN
INTRAMUSCULAR | Status: AC
  Filled 2017-02-13: qty 4

## 2017-02-13 MED ORDER — ENOXAPARIN SODIUM 40 MG/0.4ML ~~LOC~~ SOLN
40.0000 mg | SUBCUTANEOUS | Status: AC
  Administered 2017-02-13: 40 mg via SUBCUTANEOUS
  Filled 2017-02-13: qty 0.4

## 2017-02-13 MED ORDER — PHENYLEPHRINE HCL 10 MG/ML IJ SOLN
INTRAMUSCULAR | Status: DC | PRN
  Administered 2017-02-13 (×4): 40 ug via INTRAVENOUS

## 2017-02-13 MED ORDER — HYDROMORPHONE HCL-NACL 0.5-0.9 MG/ML-% IV SOSY
0.2000 mg | PREFILLED_SYRINGE | INTRAVENOUS | Status: DC | PRN
  Administered 2017-02-13 – 2017-02-14 (×3): 0.5 mg via INTRAVENOUS
  Filled 2017-02-13 (×3): qty 1

## 2017-02-13 MED ORDER — MIDAZOLAM HCL 5 MG/5ML IJ SOLN
INTRAMUSCULAR | Status: DC | PRN
  Administered 2017-02-13: 0.5 mg via INTRAVENOUS
  Administered 2017-02-13: 1 mg via INTRAVENOUS
  Administered 2017-02-13: 0.5 mg via INTRAVENOUS

## 2017-02-13 MED ORDER — SUGAMMADEX SODIUM 500 MG/5ML IV SOLN
INTRAVENOUS | Status: DC | PRN
  Administered 2017-02-13: 300 mg via INTRAVENOUS

## 2017-02-13 MED ORDER — ONDANSETRON HCL 4 MG PO TABS: 4.0000 mg | ORAL_TABLET | Freq: Four times a day (QID) | ORAL | Status: DC | PRN

## 2017-02-13 MED ORDER — PHENYLEPHRINE 40 MCG/ML (10ML) SYRINGE FOR IV PUSH (FOR BLOOD PRESSURE SUPPORT)
PREFILLED_SYRINGE | INTRAVENOUS | Status: AC
  Filled 2017-02-13: qty 10

## 2017-02-13 MED ORDER — TRAMADOL HCL 50 MG PO TABS
100.0000 mg | ORAL_TABLET | Freq: Four times a day (QID) | ORAL | Status: DC | PRN
  Filled 2017-02-13: qty 2

## 2017-02-13 MED ORDER — FENTANYL CITRATE (PF) 100 MCG/2ML IJ SOLN
INTRAMUSCULAR | Status: DC | PRN
  Administered 2017-02-13 (×5): 50 ug via INTRAVENOUS

## 2017-02-13 MED ORDER — HYDROMORPHONE HCL-NACL 0.5-0.9 MG/ML-% IV SOSY
0.2500 mg | PREFILLED_SYRINGE | INTRAVENOUS | Status: DC | PRN
  Administered 2017-02-13 (×4): 0.5 mg via INTRAVENOUS

## 2017-02-13 MED ORDER — FENTANYL CITRATE (PF) 250 MCG/5ML IJ SOLN
INTRAMUSCULAR | Status: AC
  Filled 2017-02-13: qty 5

## 2017-02-13 MED ORDER — BUPIVACAINE LIPOSOME 1.3 % IJ SUSP
20.0000 mL | Freq: Once | INTRAMUSCULAR | Status: DC
  Filled 2017-02-13: qty 20

## 2017-02-13 MED ORDER — SUGAMMADEX SODIUM 200 MG/2ML IV SOLN
INTRAVENOUS | Status: AC
  Filled 2017-02-13: qty 2

## 2017-02-13 MED ORDER — STERILE WATER FOR IRRIGATION IR SOLN
Status: DC | PRN
Start: 2017-02-13 — End: 2017-02-13
  Administered 2017-02-13: 1000 mL

## 2017-02-13 MED ORDER — FENTANYL CITRATE (PF) 100 MCG/2ML IJ SOLN
INTRAMUSCULAR | Status: AC
  Filled 2017-02-13: qty 2

## 2017-02-13 MED ORDER — BUPIVACAINE HCL 0.25 % IJ SOLN
INTRAMUSCULAR | Status: DC | PRN
  Administered 2017-02-13: 20 mL

## 2017-02-13 MED ORDER — LACTATED RINGERS IV SOLN
INTRAVENOUS | Status: DC
  Administered 2017-02-13 (×3): via INTRAVENOUS

## 2017-02-13 MED ORDER — SENNOSIDES-DOCUSATE SODIUM 8.6-50 MG PO TABS
2.0000 | ORAL_TABLET | Freq: Every day | ORAL | Status: DC
  Administered 2017-02-13: 2 via ORAL
  Filled 2017-02-13: qty 2

## 2017-02-13 SURGICAL SUPPLY — 112 items
ADH SKN CLS APL DERMABOND .7 (GAUZE/BANDAGES/DRESSINGS)
AGENT HMST KT MTR STRL THRMB (HEMOSTASIS)
AGENT HMST MTR 8 SURGIFLO (HEMOSTASIS)
APL ESCP 34 STRL LF DISP (HEMOSTASIS)
APPLICATOR SURGIFLO ENDO (HEMOSTASIS) IMPLANT
ATTRACTOMAT 16X20 MAGNETIC DRP (DRAPES) IMPLANT
BAG LAPAROSCOPIC 12 15 PORT 16 (BASKET) IMPLANT
BAG RETRIEVAL 12/15 (BASKET) ×3
BAG SPEC RTRVL LRG 6X4 10 (ENDOMECHANICALS)
BLADE EXTENDED COATED 6.5IN (ELECTRODE) ×2 IMPLANT
CELLS DAT CNTRL 66122 CELL SVR (MISCELLANEOUS) IMPLANT
CHLORAPREP W/TINT 26ML (MISCELLANEOUS) ×2 IMPLANT
CLIP VESOCCLUDE LG 6/CT (CLIP) ×2 IMPLANT
CLIP VESOCCLUDE MED 6/CT (CLIP) ×2 IMPLANT
CLIP VESOCCLUDE MED LG 6/CT (CLIP) ×2 IMPLANT
CONT SPEC 4OZ CLIKSEAL STRL BL (MISCELLANEOUS) IMPLANT
COVER BACK TABLE 60X90IN (DRAPES) ×3 IMPLANT
COVER SURGICAL LIGHT HANDLE (MISCELLANEOUS) ×3 IMPLANT
COVER TIP SHEARS 8 DVNC (MISCELLANEOUS) ×2 IMPLANT
COVER TIP SHEARS 8MM DA VINCI (MISCELLANEOUS) ×1
DERMABOND ADVANCED (GAUZE/BANDAGES/DRESSINGS)
DERMABOND ADVANCED .7 DNX12 (GAUZE/BANDAGES/DRESSINGS) ×2 IMPLANT
DRAPE ARM DVNC X/XI (DISPOSABLE) ×8 IMPLANT
DRAPE COLUMN DVNC XI (DISPOSABLE) ×2 IMPLANT
DRAPE DA VINCI XI ARM (DISPOSABLE) ×4
DRAPE DA VINCI XI COLUMN (DISPOSABLE) ×1
DRAPE INCISE IOBAN 66X45 STRL (DRAPES) IMPLANT
DRAPE SHEET LG 3/4 BI-LAMINATE (DRAPES) ×5 IMPLANT
DRAPE SURG IRRIG POUCH 19X23 (DRAPES) ×3 IMPLANT
DRAPE WARM FLUID 44X44 (DRAPE) ×2 IMPLANT
DRSG TEGADERM 2-3/8X2-3/4 SM (GAUZE/BANDAGES/DRESSINGS) ×1 IMPLANT
ELECT REM PT RETURN 15FT ADLT (MISCELLANEOUS) ×3 IMPLANT
GAUZE SPONGE 4X4 16PLY XRAY LF (GAUZE/BANDAGES/DRESSINGS) IMPLANT
GLOVE BIO SURGEON STRL SZ 6 (GLOVE) ×12 IMPLANT
GLOVE BIO SURGEON STRL SZ 6.5 (GLOVE) ×6 IMPLANT
GOWN STRL REUS W/ TWL LRG LVL3 (GOWN DISPOSABLE) ×4 IMPLANT
GOWN STRL REUS W/TWL LRG LVL3 (GOWN DISPOSABLE) ×9
HEMOSTAT ARISTA ABSORB 3G PWDR (MISCELLANEOUS) IMPLANT
HOLDER FOLEY CATH W/STRAP (MISCELLANEOUS) ×3 IMPLANT
IRRIG SUCT STRYKERFLOW 2 WTIP (MISCELLANEOUS) ×3
IRRIGATION SUCT STRKRFLW 2 WTP (MISCELLANEOUS) ×2 IMPLANT
KIT BASIN OR (CUSTOM PROCEDURE TRAY) ×3 IMPLANT
KIT PROCEDURE DA VINCI SI (MISCELLANEOUS)
KIT PROCEDURE DVNC SI (MISCELLANEOUS) IMPLANT
LIGASURE IMPACT 36 18CM CVD LR (INSTRUMENTS) IMPLANT
LOOP VESSEL MAXI BLUE (MISCELLANEOUS) IMPLANT
MANIPULATOR UTERINE 4.5 ZUMI (MISCELLANEOUS) ×3 IMPLANT
MARKER SKIN DUAL TIP RULER LAB (MISCELLANEOUS) ×3 IMPLANT
NDL SAFETY ECLIPSE 18X1.5 (NEEDLE) ×2 IMPLANT
NDL SPNL 18GX3.5 QUINCKE PK (NEEDLE) ×2 IMPLANT
NEEDLE HYPO 18GX1.5 SHARP (NEEDLE)
NEEDLE HYPO 22GX1.5 SAFETY (NEEDLE) ×5 IMPLANT
NEEDLE SPNL 18GX3.5 QUINCKE PK (NEEDLE) IMPLANT
NS IRRIG 1000ML POUR BTL (IV SOLUTION) ×4 IMPLANT
OBTURATOR OPTICAL STANDARD 8MM (TROCAR) ×1
OBTURATOR OPTICAL STND 8 DVNC (TROCAR) ×2
OBTURATOR OPTICALSTD 8 DVNC (TROCAR) ×2 IMPLANT
OCCLUDER COLPOPNEUMO (BALLOONS) ×2 IMPLANT
PACK GENERAL/GYN (CUSTOM PROCEDURE TRAY) ×2 IMPLANT
PACK ROBOT GYN CUSTOM WL (TRAY / TRAY PROCEDURE) ×3 IMPLANT
PAD POSITIONING PINK XL (MISCELLANEOUS) ×3 IMPLANT
PENCIL ELECTRO RS 15 CORD (MISCELLANEOUS) ×1 IMPLANT
PORT ACCESS TROCAR AIRSEAL 12 (TROCAR) ×2 IMPLANT
PORT ACCESS TROCAR AIRSEAL 5M (TROCAR) ×1
POUCH SPECIMEN RETRIEVAL 10MM (ENDOMECHANICALS) IMPLANT
RELOAD PROXIMATE 75MM BLUE (ENDOMECHANICALS) IMPLANT
RELOAD PROXIMATE TA60MM BLUE (ENDOMECHANICALS) IMPLANT
RELOAD STAPLE 60 BLU REG PROX (ENDOMECHANICALS) IMPLANT
RELOAD STAPLE 75 3.8 BLU REG (ENDOMECHANICALS) IMPLANT
RETRACTOR WND ALEXIS 18 MED (MISCELLANEOUS) IMPLANT
RETRACTOR WND ALEXIS 25 LRG (MISCELLANEOUS) IMPLANT
RTRCTR WOUND ALEXIS 18CM MED (MISCELLANEOUS)
RTRCTR WOUND ALEXIS 25CM LRG (MISCELLANEOUS)
SCISSORS LAP 5X35 DISP (ENDOMECHANICALS) ×1 IMPLANT
SEAL CANN UNIV 5-8 DVNC XI (MISCELLANEOUS) ×8 IMPLANT
SEAL XI 5MM-8MM UNIVERSAL (MISCELLANEOUS) ×4
SET TRI-LUMEN FLTR TB AIRSEAL (TUBING) ×3 IMPLANT
SHEET LAVH (DRAPES) ×2 IMPLANT
SOLUTION ELECTROLUBE (MISCELLANEOUS) ×2 IMPLANT
SPOGE SURGIFLO 8M (HEMOSTASIS)
SPONGE LAP 18X18 X RAY DECT (DISPOSABLE) ×1 IMPLANT
SPONGE SURGIFLO 8M (HEMOSTASIS) IMPLANT
STAPLER GUN LINEAR PROX 60 (STAPLE) IMPLANT
STAPLER PROXIMATE 75MM BLUE (STAPLE) IMPLANT
STAPLER VISISTAT 35W (STAPLE) IMPLANT
SURGIFLO W/THROMBIN 8M KIT (HEMOSTASIS) IMPLANT
SUT MNCRL AB 4-0 PS2 18 (SUTURE) ×4 IMPLANT
SUT PDS AB 1 TP1 96 (SUTURE) ×5 IMPLANT
SUT SILK 3 0 SH CR/8 (SUTURE) ×2 IMPLANT
SUT VIC AB 0 CT1 27 (SUTURE) ×3
SUT VIC AB 0 CT1 27XBRD ANTBC (SUTURE) ×2 IMPLANT
SUT VIC AB 0 CT1 36 (SUTURE) ×8 IMPLANT
SUT VIC AB 2-0 CT1 36 (SUTURE) ×4 IMPLANT
SUT VIC AB 2-0 CT2 27 (SUTURE) ×12 IMPLANT
SUT VIC AB 2-0 SH 27 (SUTURE) ×18
SUT VIC AB 2-0 SH 27X BRD (SUTURE) IMPLANT
SUT VIC AB 3-0 CTX 36 (SUTURE) IMPLANT
SUT VIC AB 3-0 SH 18 (SUTURE) IMPLANT
SUT VIC AB 3-0 SH 27 (SUTURE)
SUT VIC AB 3-0 SH 27X BRD (SUTURE) ×2 IMPLANT
SYR 10ML LL (SYRINGE) ×3 IMPLANT
SYR 30ML LL (SYRINGE) ×4 IMPLANT
SYR 50ML LL SCALE MARK (SYRINGE) ×2 IMPLANT
TOWEL OR 17X26 10 PK STRL BLUE (TOWEL DISPOSABLE) ×5 IMPLANT
TOWEL OR NON WOVEN STRL DISP B (DISPOSABLE) ×3 IMPLANT
TRAP SPECIMEN MUCOUS 40CC (MISCELLANEOUS) ×1 IMPLANT
TRAY FOLEY CATH 14FR (SET/KITS/TRAYS/PACK) ×1 IMPLANT
TRAY FOLEY W/METER SILVER 16FR (SET/KITS/TRAYS/PACK) ×2 IMPLANT
TROCAR BLADELESS OPT 5 100 (ENDOMECHANICALS) ×2 IMPLANT
UNDERPAD 30X30 (UNDERPADS AND DIAPERS) ×5 IMPLANT
WATER STERILE IRR 1000ML POUR (IV SOLUTION) ×6 IMPLANT
YANKAUER SUCT BULB TIP 10FT TU (MISCELLANEOUS) ×1 IMPLANT

## 2017-02-13 NOTE — Transfer of Care (Signed)
Immediate Anesthesia Transfer of Care Note  Patient: Paula Navarro  Procedure(s) Performed: Procedure(s): XI ROBOTIC ASSISTED TOTAL HYSTERECTOMY WITH BILATERAL SALPINGO OOPHORECTOMY WITH LYSIS OF ADHESIONS, UTERUS GREATER THAN 250GRAMS (Bilateral) MINI EXPLORATORY LAPAROTOMY (N/A)  Patient Location: PACU  Anesthesia Type:General  Level of Consciousness: awake, oriented, drowsy, patient cooperative and responds to stimulation  Airway & Oxygen Therapy: Patient Spontanous Breathing and Patient connected to face mask oxygen  Post-op Assessment: Report given to RN, Post -op Vital signs reviewed and stable and Patient moving all extremities  Post vital signs: Reviewed and stable  Last Vitals:  Vitals:   02/13/17 0756  BP: 129/81  Pulse: 82  Resp: 16  Temp: 37.5 C    Last Pain:  Vitals:   02/13/17 0756  TempSrc: Oral      Patients Stated Pain Goal: 3 (33/83/29 1916)  Complications: No apparent anesthesia complications

## 2017-02-13 NOTE — Op Note (Signed)
OPERATIVE NOTE 02/13/17  Surgeon: [name]   Assistants: Dr Lahoma Crocker (an MD assistant was necessary for tissue manipulation, management of robotic instrumentation, retraction and positioning due to the complexity of the case and hospital policies).   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 Left ovary enlarged with apparent tumor. Retroperitoneal fibrosis consistent with nodal metastatic disease. Oozing from multiple surfaces consistent with platelet dysfunction.  Estimated Blood Loss:  less than 100 mL      Total IV Fluids: 1,000 ml         Specimens: uterus, cervix, bilateral tubes and ovaries, omentum         Complications:  None; patient tolerated the procedure well.         Disposition: PACU - hemodynamically stable.  Procedure Details  The patient was seen in the Holding Room. The risks, benefits, complications, treatment options, and expected outcomes were discussed with the patient.  The patient concurred with the proposed plan, giving informed consent.  The site of surgery properly noted/marked. The patient was identified as Paula Navarro and the procedure verified as a Robotic-assisted hysterectomy >250gm with bilateral salpingo oophorectomy. A Time Out was held and the above information confirmed.  After induction of anesthesia, the patient was draped and prepped in the usual sterile manner. Pt was placed in supine position after anesthesia and draped and  prepped in the usual sterile manner. The abdominal drape was placed after the CholoraPrep had been allowed to dry for 3 minutes.  Her arms were tucked to her side with all appropriate precautions.  The shoulders were stabilized with padded shoulder blocks applied to the acromium processes.  The patient was placed in the semi-lithotomy position in [place].  The perineum was prepped with Betadine. The patient was then prepped. Foley catheter was placed.  A sterile speculum was placed in the vagina.  The cervix was grasped with a single-tooth tenaculum and dilated with Kennon Rounds dilators.  The ZUMI uterine manipulator with a medium colpotomizer ring was placed without difficulty.  A pneum occluder balloon was placed over the manipulator.  OG tube placement was confirmed and to suction.   Next, a 5 mm skin incision was made 1 cm below the subcostal margin in the midclavicular line.  The 5 mm Optiview port and scope was used for direct entry.  Opening pressure was under 10 mm CO2.  The abdomen was insufflated and the findings were noted as above.   At this point and all points during the procedure, the patient's intra-abdominal pressure did not exceed 15 mmHg. Next, a 10 mm skin incision was made in the umbilicus and a right and left port was placed about 10 cm lateral to the robot port on the right and left side.  A fourth arm was placed in the left lower quadrant 2 cm above and superior and medial to the anterior superior iliac spine.  All ports were placed under direct visualization.  The patient was placed in steep Trendelenburg.   For 20 minutes sharp adhesiolysis was performed to mobilize and separate the omentum from the anterior abdominal  wall. Bowel was folded away into the upper abdomen.  The robot was docked in the normal manner.  The hysterectomy was started after the round ligament on the right side was incised and the retroperitoneum was entered and the pararectal space was developed.  The ureter  was noted to be on the medial leaf of the broad ligament.  The peritoneum above the ureter was incised and stretched and the infundibulopelvic ligament was skeletonized, cauterized and cut.  The posterior peritoneum was taken down to the level of the KOH ring.  There were very dense adhesions between the bladder and the lower uterine segment from prior laparotomties and cesarean sections. Sharp dissection was employed to take the anterior peritoneum and vesicouterine flap down.  The bladder flap was created to the level of the KOH ring.  The uterine artery on the right side was skeletonized, cauterized and cut in the normal manner.  A similar procedure was performed on the left.  The colpotomy was made and the uterus, cervix, bilateral ovaries and tubes were amputated. It could not be delivered through the vagina due to its size and therefore it was placed in an endocatch bag abdominally for later removal.  Pedicles were inspected and excellent hemostasis was achieved.    The colpotomy at the vaginal cuff was closed with Vicryl on a CT1 needle in a running manner.  Irrigation was used and excellent hemostasis was achieved.  At this point in the procedure was completed.  Robotic instruments were removed under direct visulaization.  The robot was undocked.   A 6cm suprapubic verticle incision was made with the scalpel in the line of her prior laparotomy scar. The subcutaneous skin was opened with the bovie. The fascia was opened with the bovie in a vertical fashion and the rectus muscles were dissected off of the fascia. The peritoneum was opened sharply in the midline. The peritoneal incision was extended. The uterine specimen in the endocatch bag was retrieved through the incision. There was bleeding from the distal omentum where it had been separated from the anterior abdominal wall. Claps were placed across the omentum and it was excised. The pedicles were made hemostatic with vicryl suture. There was bleeding  from the dome of the bladder peritoneum and therefore the peritoneal incision was closed with running vicryl suture. The fascia was closed with running looped number 1 PDS. The subcutaneous fat was closed with 2-0 vicryl. 20cc of exparel mixed with 20cc of marcaine was infiltrated into the incision. The incision was closed at the skin with monocryl and dermabond.   The 10 mm ports were closed with Vicryl on a UR-5 needle and the fascia was closed with 0 Vicryl on a UR-5 needle.  The skin was closed with 4-0 Vicryl in a subcuticular manner.  Dermabond was applied.  Sponge, lap and needle counts correct x 2.  The patient was taken to the recovery room in stable condition.  The vagina was swabbed with  minimal bleeding noted.   All instrument and needle counts were correct x  3.   The patient was transferred to the recovery room in a stable condition.  [name], MD

## 2017-02-13 NOTE — Anesthesia Preprocedure Evaluation (Addendum)
Anesthesia Evaluation  Patient identified by MRN, date of birth, ID band Patient awake    Reviewed: Allergy & Precautions, NPO status , Patient's Chart, lab work & pertinent test results  History of Anesthesia Complications (+) PSEUDOCHOLINESTERASE DEFICIENCY  Airway Mallampati: II  TM Distance: >3 FB     Dental   Pulmonary COPD, former smoker,    breath sounds clear to auscultation       Cardiovascular negative cardio ROS   Rhythm:Regular Rate:Normal     Neuro/Psych    GI/Hepatic negative GI ROS, Neg liver ROS,   Endo/Other  negative endocrine ROS  Renal/GU negative Renal ROS     Musculoskeletal   Abdominal   Peds  Hematology   Anesthesia Other Findings   Reproductive/Obstetrics                             Anesthesia Physical Anesthesia Plan  ASA: III  Anesthesia Plan: General   Post-op Pain Management:    Induction: Intravenous  PONV Risk Score and Plan: 3 and Ondansetron, Propofol and Midazolam  Airway Management Planned: Oral ETT  Additional Equipment:   Intra-op Plan:   Post-operative Plan: Possible Post-op intubation/ventilation  Informed Consent: I have reviewed the patients History and Physical, chart, labs and discussed the procedure including the risks, benefits and alternatives for the proposed anesthesia with the patient or authorized representative who has indicated his/her understanding and acceptance.   Dental advisory given  Plan Discussed with: CRNA and Anesthesiologist  Anesthesia Plan Comments:        Anesthesia Quick Evaluation

## 2017-02-13 NOTE — H&P (View-Only) (Signed)
Consult Note: Gyn-Onc  Consult was requested by Dr. Sonny Dandy and Dr Garwin Brothers for the evaluation of Paula Navarro 66 y.o. female  CC:  Chief Complaint  Patient presents with  . Endometrial Cancer    Assessment/Plan:  Paula Navarro  is a 66 y.o.  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   The prognosis from her breast cancer is poor, and therapeutic intent is palliative, however, she is continuing to have response at her metastaic sites to [place]. Therefore, we will proceed with a palliative hysterectomy/BSO. I believe a robotic approach is feasible, though she will require a minilaparotomy for specimen delivery due to the bulky size of the uterus. Unfortunately this would involve a delay in her chemotherapy for approximately 3-4 weeks while she heals.  She is >2 weeks s/p last dose of therapy.  We will check labs today to ensure normal bone marrow function.  I reviewed surgical risks with her including  bleeding, infection, damage to internal organs (such as bladder,ureters, bowels), blood clot, reoperation and rehospitalization. I reviewed anticipated postop recovery and hospital stay.  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  The patient has a history of bilatera breast cancer diagnosed in January 2015 and treated with neoadjuvant chemotherapy followed by surgery and adjuvant radiation therapy followed by anti-estrogen oral therapy. Recurrence (left chest wall, lungs, bone) was diagnosed in November, 2016. From February, 2017 until June, 2017 she received salvage chemotherapy with carboplatin and gemcitabine with improvement in her metastatic sites. In June, 2017 she was noted to  have thickened endometrium on imaging. She declined surgical intervention at the time as she was concerned about caring for her ailing mother. Subsequently a scalp mass developed as an additional site of progession and new progression developedi n the lungs and left chest wall.  She received no intervention for many months as she was caring for her mother.  A CT abdo/pelvis on 11/14/16 showed an enlarged uterus (12cm) with bulky intrauterine mass and worsening conglomeration of mediastinal and right hilar nodes. THere was also bulky and necrotic retroperitoneal adenopathy encasing the venacava.  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   She was started on [place] palliative chemotherapy on 12/08/16.   Interval History: she reports persistent and bothersome vaginal bleeding. The flow is less than a menstrual period. She denies pelvic pain. She has not required blood transfusion.  She has gone on to receive 3 cycles of [place] with day 1 of cycle 3 on 01/26/17.   CT abd/pelvis/chest on 02/05/17 showed reduction in size of breast cancer metastatic disease. Stable appearance of lymphadenopathy ( which is likely secondary to metastatic carcinosarcoma).   Current Meds:  Outpatient Encounter Prescriptions as of 02/12/2017  Medication Sig  . B Complex-C (SUPER B COMPLEX PO) Take 1 tablet by mouth daily.  . calcium-vitamin D (OSCAL-500) 500-400 MG-UNIT tablet Take 1 tablet by mouth 2 (two) times daily. (Patient taking differently: Take 1 tablet by mouth 2 (two) times daily. Pt takes once daily.)  . dicyclomine (BENTYL) 10 MG capsule TAKE 1 CAPSULE (10 MG TOTAL) BY MOUTH 3 (  THREE) TIMES DAILY AS NEEDED FOR SPASMS.  Marland Kitchen gabapentin (NEURONTIN) 300 MG capsule TAKE 2 CAPSULES (600 MG TOTAL) BY MOUTH 3 (THREE) TIMES DAILY.  Marland Kitchen lidocaine-prilocaine (EMLA)  cream Apply to affected area once  . LORazepam (ATIVAN) 1 MG tablet TAKE 1 TABLET BY MOUTH EVERYDAY AT BEDTIME  . nystatin (MYCOSTATIN) 100000 UNIT/ML suspension Take 5 mLs (500,000 Units total) by mouth 4 (four) times daily.  . ondansetron (ZOFRAN) 8 MG tablet Take 1 tablet (8 mg total) by mouth 2 (two) times daily as needed (Nausea or vomiting).  Marland Kitchen oxyCODONE (OXY IR/ROXICODONE) 5 MG immediate release tablet Take 1 tablet (5 mg total) by mouth every 6 (six) hours as needed for severe pain.  . polyethylene glycol (MIRALAX / GLYCOLAX) packet Take 17 g by mouth 2 (two) times daily.  . prochlorperazine (COMPAZINE) 10 MG tablet Take 1 tablet (10 mg total) by mouth every 6 (six) hours as needed (Nausea or vomiting).  . traMADol (ULTRAM) 50 MG tablet TAKE 1 TABLET BY MOUTH EVERY 6 HOURS AS NEEDED  . zolpidem (AMBIEN) 10 MG tablet Take 1 tablet (10 mg total) by mouth at bedtime.  . Clobetasol Prop Emollient Base (CLOBETASOL PROPIONATE E) 0.05 % emollient cream Apply 1 application topically 2 (two) times daily.   No facility-administered encounter medications on file as of 02/12/2017.     Allergy:  Allergies  Allergen Reactions  . Codeine Other (See Comments)    Hot in chest  . Morphine And Related Itching and Rash    Social Hx:   Social History   Social History  . Marital status: Widowed    Spouse name: N/A  . Number of children: N/A  . Years of education: N/A   Occupational History  . Not on file.   Social History Main Topics  . Smoking status: Former Smoker    Packs/day: 1.50    Years: 40.00    Types: Cigarettes    Quit date: 08/01/2012  . Smokeless tobacco: Never Used  . Alcohol use No  . Drug use: No  . Sexual activity: Not Currently   Other Topics Concern  . Not on file   Social History Narrative  . No narrative on file    Past Surgical Hx:  Past Surgical History:  Procedure Laterality Date  . ASD REPAIR, OSTIUM PRIMUM  1996   dr Roxy Horseman  . AXILLARY LYMPH NODE  DISSECTION Bilateral 04/08/2014   Procedure:  BILATERAL AXILLARY LYMPH NODE DISSECTION;  Surgeon: Autumn Messing III, MD;  Location: [place];  Service: General;  Laterality: Bilateral;  . BREAST BIOPSY Bilateral   . BREAST LUMPECTOMY WITH RADIOACTIVE SEED LOCALIZATION Bilateral 04/08/2014   Procedure: BILATERAL  RADIOACTIVE SEED LOCALIZATION LUMPECTOMY ;  Surgeon: Autumn Messing III, MD;  Location: [place];  Service: General;  Laterality: Bilateral;  . CESAREAN SECTION     x3  . CHOLECYSTECTOMY    . open heart surgery    . PORT A CATH REVISION  1/15   put in   . TUBAL LIGATION      Past Medical Hx:  Past Medical History:  Diagnosis Date  . Anemia   . Anxiety   . Atrial septal defect 1996   Surgical repair in 1996  . Breast cancer ([place]) dx'd 06/2013  . Chest pain    Admitted to [facility] in 09/2011; refused stress test  . Chronic bronchitis   . Chronic pain   . COPD (chronic obstructive pulmonary disease) ([place])  on xray  . Metastatic cancer to lung ([place]) dx'd 2017  . Palpitation    Tachycardia reported by monitor clerk during a symptomatic spell  . Radiation 06/30/14-08/17/14   Bilateral Breast  . Tobacco abuse    60 pack years; 1.5 packs per day  . Wears dentures    top    Past Gynecological History:  C/s x 3 Patient's last menstrual period was 05/20/2011.  Family Hx:  Family History  Problem Relation Age of Onset  . Breast cancer Maternal Aunt     Review of Systems:  Constitutional  Feels well,    ENT Normal appearing ears and nares bilaterally Skin/Breast  + scalp and left chest wall masses Cardiovascular  No chest pain, shortness of breath, or edema  Pulmonary  No cough or wheeze.  Gastro Intestinal  No nausea, vomitting, or diarrhoea. No bright red blood per rectum, no abdominal pain, change in bowel movement, or constipation.  Genito Urinary  No frequency, urgency, dysuria, + bleeding Musculo Skeletal  No myalgia, arthralgia, joint  swelling or pain  Neurologic  No weakness, numbness, change in gait,  Psychology  No depression, anxiety, insomnia.   Vitals:  Blood pressure (!) 124/92, pulse 99, temperature 98 F (36.7 C), resp. rate 18, weight 114 lb 11.2 oz (52 kg), last menstrual period 05/20/2011, SpO2 99 %.  Physical Exam: WD in NAD Neck  Supple NROM, without any enlargements.  Lymph Node Survey + left axillary adenopathy. Cardiovascular  Pulse normal rate, regularity and rhythm. S1 and S2 normal.  Lungs  Clear to auscultation bilateraly, without wheezes/crackles/rhonchi. Good air movement.  Skin  + 15cm left anterior upper chest wall mass pointing (almost erupting through) to the skin with induration spreading down the chest wall. Psychiatry  Alert and oriented to person, place, and time  Abdomen  Normoactive bowel sounds, abdomen soft, non-tender and nonobese without evidence of hernia. Vertical midline and subcostal incisions. Back No CVA tenderness Genito Urinary  Vulva/vagina: Normal external female genitalia.  No lesions. No discharge or bleeding.  Bladder/urethra:  No lesions or masses, well supported bladder  Vagina: normal  Cervix: Normal appearing, no lesions.  Uterus: mobile, bulky at 12cm but with no parametrial involvement or nodularity.  Adnexa: no discrete masses. Rectal  deferred  Extremities  No bilateral cyanosis, clubbing or edema.   [name], MD  02/12/2017, 9:24 AM

## 2017-02-13 NOTE — Anesthesia Postprocedure Evaluation (Signed)
Anesthesia Post Note  Patient: Paula Navarro  Procedure(s) Performed: Procedure(s) (LRB): XI ROBOTIC ASSISTED TOTAL HYSTERECTOMY WITH BILATERAL SALPINGO OOPHORECTOMY WITH LYSIS OF ADHESIONS, UTERUS GREATER THAN 250GRAMS (Bilateral) MINI EXPLORATORY LAPAROTOMY (N/A)     Patient location during evaluation: PACU Anesthesia Type: General Level of consciousness: awake Pain management: pain level controlled Vital Signs Assessment: post-procedure vital signs reviewed and stable Respiratory status: spontaneous breathing Cardiovascular status: stable Anesthetic complications: no    Last Vitals:  Vitals:   02/13/17 1250 02/13/17 1300  BP: (!) 152/79 (!) 143/75  Pulse: 73 76  Resp: 16 15  Temp: (!) 36.4 C     Last Pain:  Vitals:   02/13/17 1310  TempSrc:   PainSc: 7                  [name]

## 2017-02-13 NOTE — Interval H&P Note (Signed)
History and Physical Interval Note:  02/13/2017 9:26 AM  Paula Navarro  has presented today for surgery, with the diagnosis of ENDOMETRIAL CANCER  The various methods of treatment have been discussed with the patient and family. After consideration of risks, benefits and other options for treatment, the patient has consented to  Procedure(s): XI ROBOTIC ASSISTED TOTAL HYSTERECTOMY WITH BILATERAL SALPINGO OOPHORECTOMY (Bilateral) EXPLORATORY LAPAROTOMY (N/A) as a surgical intervention .  The patient's history has been reviewed, patient examined, no change in status, stable for surgery.  I have reviewed the patient's chart and labs.  Questions were answered to the patient's satisfaction.     [name]

## 2017-02-13 NOTE — Anesthesia Procedure Notes (Signed)
Procedure Name: Intubation Date/Time: 02/13/2017 10:11 AM Performed by: Belinda Block Pre-anesthesia Checklist: Patient identified, Emergency Drugs available, Suction available, Patient being monitored and Timeout performed Patient Re-evaluated:Patient Re-evaluated prior to induction Oxygen Delivery Method: Circle system utilized Preoxygenation: Pre-oxygenation with 100% oxygen Induction Type: IV induction Ventilation: Mask ventilation without difficulty Laryngoscope Size: Mac and 3 Grade View: Grade I Tube type: Oral Tube size: 7.5 mm Number of attempts: 1 Airway Equipment and Method: Stylet Placement Confirmation: ETT inserted through vocal cords under direct vision,  positive ETCO2 and breath sounds checked- equal and bilateral Secured at: 21 cm Tube secured with: Tape Dental Injury: Teeth and Oropharynx as per pre-operative assessment

## 2017-02-14 DIAGNOSIS — C7982 Secondary malignant neoplasm of genital organs: Secondary | ICD-10-CM | POA: Diagnosis not present

## 2017-02-14 LAB — CBC
HCT: 26.9 % — ABNORMAL LOW (ref 36.0–46.0)
HEMOGLOBIN: 8.6 g/dL — AB (ref 12.0–15.0)
MCH: 20.7 pg — AB (ref 26.0–34.0)
MCHC: 32 g/dL (ref 30.0–36.0)
MCV: 64.8 fL — ABNORMAL LOW (ref 78.0–100.0)
Platelets: 273 10*3/uL (ref 150–400)
RBC: 4.15 MIL/uL (ref 3.87–5.11)
RDW: 21.2 % — ABNORMAL HIGH (ref 11.5–15.5)
WBC: 12.9 10*3/uL — ABNORMAL HIGH (ref 4.0–10.5)

## 2017-02-14 LAB — BASIC METABOLIC PANEL
Anion gap: 9 (ref 5–15)
BUN: 13 mg/dL (ref 6–20)
CHLORIDE: 100 mmol/L — AB (ref 101–111)
CO2: 27 mmol/L (ref 22–32)
Calcium: 8.9 mg/dL (ref 8.9–10.3)
Creatinine, Ser: 0.82 mg/dL (ref 0.44–1.00)
GFR calc Af Amer: >60 mL/min (ref >60)
GFR calc non Af Amer: >60 mL/min (ref >60)
GLUCOSE: 133 mg/dL — AB (ref 65–99)
POTASSIUM: 4.4 mmol/L (ref 3.5–5.1)
Sodium: 136 mmol/L (ref 135–145)

## 2017-02-14 MED ORDER — SENNOSIDES-DOCUSATE SODIUM 8.6-50 MG PO TABS: 2.0000 | ORAL_TABLET | Freq: Every day | ORAL | 1 refills | Status: DC

## 2017-02-14 MED ORDER — GABAPENTIN 300 MG PO CAPS
600.0000 mg | ORAL_CAPSULE | Freq: Three times a day (TID) | ORAL | Status: DC
  Administered 2017-02-14: 600 mg via ORAL
  Filled 2017-02-14: qty 2

## 2017-02-14 MED ORDER — SODIUM CHLORIDE 0.9% FLUSH: 10.0000 mL | INTRAVENOUS | Status: DC | PRN

## 2017-02-14 MED ORDER — HEPARIN SOD (PORK) LOCK FLUSH 100 UNIT/ML IV SOLN
500.0000 [IU] | INTRAVENOUS | Status: AC | PRN
  Administered 2017-02-14: 500 [IU]

## 2017-02-14 MED ORDER — FERROUS GLUCONATE 324 (38 FE) MG PO TABS: 324.0000 mg | ORAL_TABLET | Freq: Two times a day (BID) | ORAL | 3 refills | Status: DC

## 2017-02-14 MED ORDER — HYDROMORPHONE HCL 2 MG PO TABS: 2.0000 mg | ORAL_TABLET | ORAL | 0 refills | Status: DC | PRN

## 2017-02-14 MED ORDER — IBUPROFEN 600 MG PO TABS: 600.0000 mg | ORAL_TABLET | Freq: Three times a day (TID) | ORAL | 0 refills | Status: DC | PRN

## 2017-02-14 NOTE — Discharge Instructions (Signed)
02/13/2017  Return to work: 4 weeks  Activity: 1. Be up and out of the bed during the day.  Take a nap if needed.  You may walk up steps but be careful and use the hand rail.  Stair climbing will tire you more than you think, you may need to stop part way and rest.   2. No lifting or straining for 6 weeks.  3. No driving for 1 weeks.  Do Not drive if you are taking narcotic pain medicine.  4. Shower daily.  Use soap and water on your incision and pat dry; don't rub.   5. No sexual activity and nothing in the vagina for 8 weeks.  Medications:  - Take ibuprofen and tylenol first line for pain control. Take these regularly (every 6 hours) to decrease the build up of pain.  - If necessary, for severe pain not relieved by ibuprofen, take dilaudid.  - While taking dilaudid you should take sennakot every night to reduce the likelihood of constipation. If this causes diarrhea, stop its use.  - take iron (ferrous gluconate) twice a day for a month for your anemia  Diet: 1. Low sodium Heart Healthy Diet is recommended.  2. It is safe to use a laxative if you have difficulty moving your bowels.   Wound Care: 1. Keep clean and dry.  Shower daily.  Reasons to call the Doctor:   Fever - Oral temperature greater than 100.4 degrees Fahrenheit  Foul-smelling vaginal discharge  Difficulty urinating  Nausea and vomiting  Increased pain at the site of the incision that is unrelieved with pain medicine.  Difficulty breathing with or without chest pain  New calf pain especially if only on one side  Sudden, continuing increased vaginal bleeding with or without clots.   Follow-up: 1. See Everitt Amber in 3 weeks.  Contacts: For questions or concerns you should contact:  Dr. Everitt Amber at [phone] After hours and on week-ends call [phone] and ask to speak to the physician on call for Gynecologic Oncology

## 2017-02-14 NOTE — Discharge Summary (Signed)
Physician Discharge Summary  Patient ID: Paula Navarro MRN: [id number] DOB/AGE: January 22, 1951 66 y.o.  Admit date: 02/13/2017 Discharge date: 02/14/2017  Admission Diagnoses: Endometrial cancer [facility])  Discharge Diagnoses:  Principal Problem:   Endometrial cancer [facility]) Active Problems:   Metastatic breast cancer [facility])   Discharged Condition: good  Hospital Course:  1/ patient was admitted on 02/13/17 for a robotic assisted hysterectomy for uterus >250gm with BSO and minilaparotomy for specimen delivery. 2/ surgery was uncomplicated  3/ on postoperative day 1 the patient was meeting discharge criteria: tolerating PO, voiding urine, ambulating, pain well controlled on oral medications.  4/ new medications on discharge include ferrous gluconate, senokot, dilaudid PO 2mg .  Consults: None  Significant Diagnostic Studies: labs:  CBC    Component Value Date/Time   WBC 12.9 (H) 02/14/2017 0612   RBC 4.15 02/14/2017 0612   HGB 8.6 (L) 02/14/2017 0612   HGB 9.9 (L) 02/02/2017 0948   HCT 26.9 (L) 02/14/2017 0612   HCT 31.6 (L) 02/02/2017 0948   PLT 273 02/14/2017 0612   PLT 263 02/02/2017 0948   MCV 64.8 (L) 02/14/2017 0612   MCV 67.0 (L) 02/02/2017 0948   MCH 20.7 (L) 02/14/2017 0612   MCHC 32.0 02/14/2017 0612   RDW 21.2 (H) 02/14/2017 0612   RDW 22.7 (H) 02/02/2017 0948   LYMPHSABS 2.3 02/02/2017 0948   MONOABS 0.4 02/02/2017 0948   EOSABS 0.0 02/02/2017 0948   BASOSABS 0.0 02/02/2017 0948    Treatments: surgery: see above  Discharge Exam: Blood pressure (!) 122/55, pulse 78, temperature 99.4 F (37.4 C), temperature source Oral, resp. rate 16, height 5\' 2"  (1.575 m), weight 115 lb 6 oz (52.3 kg), last menstrual period 05/20/2011, SpO2 100 %. General appearance: alert and cooperative GI: soft, non-tender; bowel sounds normal; no masses,  no organomegaly Pelvic: scan bleeding Incision/Wound: skin glue removed with dressing removal from LUQ incision - will replace glue. No  bleeding. No hematomas.  Disposition: 01-Home or Self Care  Discharge Instructions    (HEART FAILURE PATIENTS) Call MD:  Anytime you have any of the following symptoms: 1) 3 pound weight gain in 24 hours or 5 pounds in 1 week 2) shortness of breath, with or without a dry hacking cough 3) swelling in the hands, feet or stomach 4) if you have to sleep on extra pillows at night in order to breathe.    Complete by:  As directed    Call MD for:  difficulty breathing, headache or visual disturbances    Complete by:  As directed    Call MD for:  extreme fatigue    Complete by:  As directed    Call MD for:  hives    Complete by:  As directed    Call MD for:  persistant dizziness or light-headedness    Complete by:  As directed    Call MD for:  persistant nausea and vomiting    Complete by:  As directed    Call MD for:  redness, tenderness, or signs of infection (pain, swelling, redness, odor or green/yellow discharge around incision site)    Complete by:  As directed    Call MD for:  severe uncontrolled pain    Complete by:  As directed    Call MD for:  temperature >100.4    Complete by:  As directed    Diet - low sodium heart healthy    Complete by:  As directed    Diet general  Complete by:  As directed    Driving Restrictions    Complete by:  As directed    No driving for 7 days or until off narcotic pain medication   Increase activity slowly    Complete by:  As directed    Remove dressing in 24 hours    Complete by:  As directed    Sexual Activity Restrictions    Complete by:  As directed    No intercourse for 6 weeks     Allergies as of 02/14/2017      Reactions   Codeine Other (See Comments)   Hot in chest   Morphine And Related Itching, Rash      Medication List    TAKE these medications   calcium-vitamin D 500-400 MG-UNIT tablet Commonly known as:  OSCAL-500 Take 1 tablet by mouth 2 (two) times daily. What changed:  additional instructions   Clobetasol Prop  Emollient Base 0.05 % emollient cream Commonly known as:  CLOBETASOL PROPIONATE E Apply 1 application topically 2 (two) times daily.   dicyclomine 10 MG capsule Commonly known as:  BENTYL TAKE 1 CAPSULE (10 MG TOTAL) BY MOUTH 3 (THREE) TIMES DAILY AS NEEDED FOR SPASMS.   ferrous gluconate 324 MG tablet Commonly known as:  FERGON Take 1 tablet (324 mg total) by mouth 2 (two) times daily with a meal.   gabapentin 300 MG capsule Commonly known as:  NEURONTIN TAKE 2 CAPSULES (600 MG TOTAL) BY MOUTH 3 (THREE) TIMES DAILY.   HYDROmorphone 2 MG tablet Commonly known as:  DILAUDID Take 1 tablet (2 mg total) by mouth every 4 (four) hours as needed for severe pain.   ibuprofen 600 MG tablet Commonly known as:  ADVIL,MOTRIN Take 1 tablet (600 mg total) by mouth every 8 (eight) hours as needed (mild pain).   lidocaine-prilocaine cream Commonly known as:  EMLA Apply to affected area once   LORazepam 1 MG tablet Commonly known as:  ATIVAN TAKE 1 TABLET BY MOUTH EVERYDAY AT BEDTIME   nystatin 100000 UNIT/ML suspension Commonly known as:  MYCOSTATIN Take 5 mLs (500,000 Units total) by mouth 4 (four) times daily.   ondansetron 8 MG tablet Commonly known as:  ZOFRAN Take 1 tablet (8 mg total) by mouth 2 (two) times daily as needed (Nausea or vomiting).   oxyCODONE 5 MG immediate release tablet Commonly known as:  Oxy IR/ROXICODONE Take 1 tablet (5 mg total) by mouth every 6 (six) hours as needed for severe pain.   polyethylene glycol packet Commonly known as:  MIRALAX / GLYCOLAX Take 17 g by mouth 2 (two) times daily.   prochlorperazine 10 MG tablet Commonly known as:  COMPAZINE Take 1 tablet (10 mg total) by mouth every 6 (six) hours as needed (Nausea or vomiting).   senna-docusate 8.6-50 MG tablet Commonly known as:  Senokot-S Take 2 tablets by mouth at bedtime.   SUPER B COMPLEX PO Take 1 tablet by mouth daily.   traMADol 50 MG tablet Commonly known as:  ULTRAM TAKE 1  TABLET BY MOUTH EVERY 6 HOURS AS NEEDED   zolpidem 10 MG tablet Commonly known as:  AMBIEN Take 1 tablet (10 mg total) by mouth at bedtime.      Follow-up Information    Everitt Amber, MD Follow up in 2 week(s).   Specialty:  Obstetrics and Gynecology Contact information: [place] [id number] [phone]           Signed: [name] 02/14/2017, 8:55  AM    

## 2017-02-14 NOTE — Progress Notes (Signed)
Patient alert, oriented, resting in bed, comfortable, eating lunch intermittently.  Dermabond glue applied to the left upper quadrant incision without difficulty.  Incisions without drainage or erythema.  Patient for discharge later today.  All questions answered.

## 2017-02-15 ENCOUNTER — Telehealth: Payer: Self-pay

## 2017-02-15 ENCOUNTER — Telehealth: Payer: Self-pay | Admitting: Hematology and Oncology

## 2017-02-15 NOTE — Telephone Encounter (Signed)
Called pt bu phone went straight to voicemail. Left message to inform patient of cxl appts 7/20 and r/s to 8/9 at 1045 per sch msg

## 2017-02-15 NOTE — Telephone Encounter (Signed)
Pt 2 days post hysterectomy. Called to find out when she can resume her chemo treatment. Discussed with Dr.Gudena and told pt that she will need to postpone chemo for 3 weeks. Sent scheduling message to reschedule pt. Pt verbalized understanding.Will call with additional questions.

## 2017-02-16 ENCOUNTER — Encounter: Payer: Medicare Other | Admitting: Nutrition

## 2017-02-16 ENCOUNTER — Ambulatory Visit: Payer: Medicare Other | Admitting: Hematology and Oncology

## 2017-02-16 ENCOUNTER — Other Ambulatory Visit: Payer: Medicare Other

## 2017-02-16 ENCOUNTER — Ambulatory Visit: Payer: Medicare Other

## 2017-02-16 LAB — TYPE AND SCREEN
ABO/RH(D): AB POS
Antibody Screen: NEGATIVE
DONOR AG TYPE: NEGATIVE
DONOR AG TYPE: NEGATIVE
UNIT DIVISION: 0
Unit division: 0

## 2017-02-16 LAB — BPAM RBC
BLOOD PRODUCT EXPIRATION DATE: 201808022359
Blood Product Expiration Date: 201808012359
UNIT TYPE AND RH: 7300
UNIT TYPE AND RH: 7300

## 2017-02-19 ENCOUNTER — Telehealth: Payer: Self-pay | Admitting: Gynecologic Oncology

## 2017-02-19 ENCOUNTER — Telehealth: Payer: Self-pay

## 2017-02-19 NOTE — Telephone Encounter (Signed)
Pt called to report that she is having a lot of constipation. Pt states that she is doing much better after surgery. Pt just started increasing solid foods since coming home from surgery. She believes that her iron pills and her pain medication is contributing a lot to her constipation. Pt states that she stopped taking her iron pills since yesterday. Told pt that Dr.Rossi prescribed these medications and pt should contact Dr.Rossi's office to notify them of pt symptoms. Encouraged pt to drink plenty of fluids and take some miralax or sennokot to help with her bowels. Encouraged pt to increase activity to help stimulate her bowel movement. Pt verbalized understanding and will call to notify Dr.Rossi of her concerns.

## 2017-02-19 NOTE — Telephone Encounter (Signed)
Returned call to patient.  She had left a message complaining about constipation.  Bowel regimen discussed including Miralax and use of stool softeners twice daily.  All questions answered.  Advised to call for any needs or concerns.

## 2017-02-22 ENCOUNTER — Telehealth: Payer: Self-pay

## 2017-02-22 ENCOUNTER — Other Ambulatory Visit: Payer: Self-pay | Admitting: Gynecologic Oncology

## 2017-02-22 DIAGNOSIS — G8918 Other acute postprocedural pain: Secondary | ICD-10-CM

## 2017-02-22 MED ORDER — DILAUDID 2 MG PO TABS: 2.0000 mg | ORAL_TABLET | ORAL | 0 refills | Status: DC | PRN

## 2017-02-22 NOTE — Telephone Encounter (Signed)
Told Paula Navarro that Dr. Denman George will prescribe #15 more tablets of the Dilaudid 2 mg tabs for post op pain. ( # 30 prescribed on 02-14-17. Prescription can be picked up up front tomorrow. Pt inquired as to weather she could take tramadol for pain.Told her that would be fine.  Pt had told [name], NP that she did not have any more tramadol.  Pt stated that she found a few tablets.(  # 54 had been prescribed on 02-05-17 by Dr. Lindi Adie)

## 2017-02-23 ENCOUNTER — Ambulatory Visit: Payer: Medicare Other

## 2017-02-23 ENCOUNTER — Other Ambulatory Visit: Payer: Medicare Other

## 2017-02-26 ENCOUNTER — Other Ambulatory Visit: Payer: Self-pay | Admitting: Hematology and Oncology

## 2017-02-26 ENCOUNTER — Emergency Department (HOSPITAL_COMMUNITY)
Admission: EM | Admit: 2017-02-26 | Discharge: 2017-02-26 | Disposition: A | Discharge status: Home / Self Care | Payer: Medicare Other | Attending: Emergency Medicine | Admitting: Emergency Medicine

## 2017-02-26 ENCOUNTER — Telehealth: Payer: Self-pay

## 2017-02-26 ENCOUNTER — Encounter (HOSPITAL_COMMUNITY): Payer: Self-pay

## 2017-02-26 DIAGNOSIS — Z85841 Personal history of malignant neoplasm of brain: Secondary | ICD-10-CM | POA: Insufficient documentation

## 2017-02-26 DIAGNOSIS — Z79899 Other long term (current) drug therapy: Secondary | ICD-10-CM | POA: Insufficient documentation

## 2017-02-26 DIAGNOSIS — J449 Chronic obstructive pulmonary disease, unspecified: Secondary | ICD-10-CM | POA: Diagnosis not present

## 2017-02-26 DIAGNOSIS — C7800 Secondary malignant neoplasm of unspecified lung: Secondary | ICD-10-CM | POA: Diagnosis not present

## 2017-02-26 DIAGNOSIS — C50111 Malignant neoplasm of central portion of right female breast: Secondary | ICD-10-CM

## 2017-02-26 DIAGNOSIS — C50112 Malignant neoplasm of central portion of left female breast: Principal | ICD-10-CM

## 2017-02-26 DIAGNOSIS — Z87891 Personal history of nicotine dependence: Secondary | ICD-10-CM | POA: Diagnosis not present

## 2017-02-26 DIAGNOSIS — L989 Disorder of the skin and subcutaneous tissue, unspecified: Secondary | ICD-10-CM | POA: Insufficient documentation

## 2017-02-26 MED ORDER — SILVER NITRATE-POT NITRATE 75-25 % EX MISC
CUTANEOUS | Status: AC
  Filled 2017-02-26: qty 3

## 2017-02-26 MED ORDER — HYDROMORPHONE HCL 2 MG PO TABS
2.0000 mg | ORAL_TABLET | Freq: Once | ORAL | Status: AC
  Administered 2017-02-26: 2 mg via ORAL
  Filled 2017-02-26: qty 1

## 2017-02-26 NOTE — ED Triage Notes (Signed)
Pt has large tumor on top of head that is cancerous. Pt is having it removed next month, but came in today due to it starting to bleed.   NAD VSS

## 2017-02-26 NOTE — Telephone Encounter (Signed)
Pt called that she is needing her next chemo. Let her know it is due 8/9. The tumor in her chest is bigger (softball sized)  and also the one on her head (cannot put her wig on). Pt is asking if she can get tumor on her head removed before chemo?  Or shrink it with the chemo?

## 2017-02-26 NOTE — Discharge Instructions (Signed)
Follow up with your md if needed °

## 2017-02-26 NOTE — ED Notes (Signed)
Advised pt not to not drive after taking medication. Pt verbalized understanding

## 2017-02-26 NOTE — ED Provider Notes (Signed)
[facility] DEPT Provider Note   CSN: [id number] Arrival date & time: 02/26/17  1515     History   Chief Complaint Chief Complaint  Patient presents with  . Tumor    HPI Paula Navarro is a 66 y.o. female.  Patient had tumor on top of her head. And the tumor start bleeding. She is getting chemotherapy and is going to have surgery   The history is provided by the patient.  Rash   This is a chronic problem. The current episode started more than 1 week ago. The problem has not changed since onset.The problem is associated with nothing. There has been no fever. The rash is present on the scalp. The pain is at a severity of 0/10. The patient is experiencing no pain. The pain has been constant since onset. Pertinent negatives include no itching.    Past Medical History:  Diagnosis Date  . Anemia   . Anxiety   . Atrial septal defect 1996   Surgical repair in 1996  . Breast cancer ([place]) dx'd 06/2013  . Chest pain    Admitted to [facility] in 09/2011; refused stress test  . Chronic bronchitis   . Chronic pain   . COPD (chronic obstructive pulmonary disease) ([place])    on xray  . Metastatic cancer to lung ([place]) dx'd 2017  . Palpitation    Tachycardia reported by monitor clerk during a symptomatic spell  . Radiation 06/30/14-08/17/14   Bilateral Breast  . Tobacco abuse    60 pack years; 1.5 packs per day  . Wears dentures    top    Patient Active Problem List   Diagnosis Date Noted  . Endometrial cancer ([place]) 12/18/2016  . Protein-calorie malnutrition, severe 11/15/2016  . UTI (urinary tract infection) 11/14/2016  . COPD (chronic obstructive pulmonary disease) ([place])   . 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 Chemotherapy-induced thrombocytopenia 06/13/2016  . Scalp lesion 04/01/2016  . Encounter for central line care 12/08/2015  . Port catheter in place 11/19/2015  . Insomnia 11/01/2015  . Microcytic anemia 09/28/2015  . Lung metastases ([place]) 09/10/2015   . Bone metastases ([place]) 07/20/2015  . Goals of care, counseling/discussion 07/13/2015  . Metastatic breast cancer ([place]) 06/29/2015  . Chronic pain 03/16/2015  . Vaginal bleeding 09/24/2014  . Postherpetic neuralgia 09/03/2014  . Lymphedema 09/03/2014  . Suspected herpes zoster left C5 distribution 08/14/2014  . 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 Anxiety 03/03/2014  . Bilateral breast cancer ([place]) 08/11/2013  . Palpitation   . Chest pain   . Atrial septal defect   . Laboratory test 11/25/2011  . Chronic bronchitis   . Tobacco abuse     Past Surgical History:  Procedure Laterality Date  . ASD REPAIR, OSTIUM PRIMUM  1996   dr Roxy Horseman  . AXILLARY LYMPH NODE DISSECTION Bilateral 04/08/2014   Procedure:  BILATERAL AXILLARY LYMPH NODE DISSECTION;  Surgeon: Autumn Messing III, MD;  Location: [facility];  Service: General;  Laterality: Bilateral;  . BREAST BIOPSY Bilateral   . BREAST LUMPECTOMY WITH RADIOACTIVE SEED LOCALIZATION Bilateral 04/08/2014   Procedure: BILATERAL  RADIOACTIVE SEED LOCALIZATION LUMPECTOMY ;  Surgeon: Autumn Messing III, MD;  Location: [facility];  Service: General;  Laterality: Bilateral;  . CESAREAN SECTION     x3  . CHOLECYSTECTOMY    . LAPAROTOMY N/A 02/13/2017   Procedure: MINI EXPLORATORY LAPAROTOMY;  Surgeon: Everitt Amber, MD;  Location: [facility];  Service: Gynecology;  Laterality: N/A;  . open heart surgery    . PORT A CATH REVISION  1/15   put in   . ROBOTIC ASSISTED TOTAL HYSTERECTOMY WITH BILATERAL SALPINGO OOPHERECTOMY Bilateral 02/13/2017   Procedure: XI ROBOTIC ASSISTED TOTAL HYSTERECTOMY WITH BILATERAL SALPINGO OOPHORECTOMY WITH LYSIS OF ADHESIONS, UTERUS GREATER THAN 250GRAMS;  Surgeon: Everitt Amber, MD;  Location: [facility];  Service: Gynecology;  Laterality: Bilateral;  . TUBAL LIGATION      OB History    Gravida Para Term Preterm AB Living   4 3 3          SAB TAB Ectopic Multiple Live Births                     Home Medications    Prior to Admission medications   Medication Sig Start Date End Date Taking? Authorizing Provider  B Complex-C (SUPER B COMPLEX PO) Take 1 tablet by mouth daily.    [provider]  calcium-vitamin D (OSCAL-500) 500-400 MG-UNIT tablet Take 1 tablet by mouth 2 (two) times daily. Patient taking differently: Take 1 tablet by mouth 2 (two) times daily. Pt takes once daily. 08/18/15   Nicholas Lose, MD  Clobetasol Prop Emollient Base (CLOBETASOL PROPIONATE E) 0.05 % emollient cream Apply 1 application topically 2 (two) times daily. 01/10/17   Nicholas Lose, MD  dicyclomine (BENTYL) 10 MG capsule TAKE 1 CAPSULE (10 MG TOTAL) BY MOUTH 3 (THREE) TIMES DAILY AS NEEDED FOR SPASMS. 02/01/17   Causey, Charlestine Massed, NP  DILAUDID 2 MG tablet Take 1 tablet (2 mg total) by mouth every 4 (four) hours as needed for severe pain. 02/22/17   [name] D, NP  ferrous gluconate (FERGON) 324 MG tablet Take 1 tablet (324 mg total) by mouth 2 (two) times daily with a meal. 02/14/17   Everitt Amber, MD  gabapentin (NEURONTIN) 300 MG capsule TAKE 2 CAPSULES (600 MG TOTAL) BY MOUTH 3 (THREE) TIMES DAILY. 12/01/16   Nicholas Lose, MD  ibuprofen (ADVIL,MOTRIN) 600 MG tablet Take 1 tablet (600 mg total) by mouth every 8 (eight) hours as needed (mild pain). 02/14/17   Everitt Amber, MD  lidocaine-prilocaine (EMLA) cream Apply to affected area once 11/27/16   Nicholas Lose, MD  LORazepam (ATIVAN) 1 MG tablet TAKE 1 TABLET BY MOUTH EVERYDAY AT BEDTIME 02/05/17   Nicholas Lose, MD  nystatin (MYCOSTATIN) 100000 UNIT/ML suspension Take 5 mLs (500,000 Units total) by mouth 4 (four) times daily. 01/26/17   Causey, Charlestine Massed, NP  ondansetron (ZOFRAN) 8 MG tablet Take 1 tablet (8 mg total) by mouth 2 (two) times daily as needed (Nausea or vomiting). 11/27/16   Nicholas Lose, MD  oxyCODONE (OXY IR/ROXICODONE) 5 MG immediate release tablet Take 1 tablet (5 mg total) by mouth every 6 (six) hours as needed  for severe pain. Patient not taking: Reported on 02/12/2017 11/09/16   Hayden Pedro, PA-C  polyethylene glycol [facility] / Floria Raveling) packet Take 17 g by mouth 2 (two) times daily. 11/16/16   Eber Jones, MD  prochlorperazine (COMPAZINE) 10 MG tablet Take 1 tablet (10 mg total) by mouth every 6 (six) hours as needed (Nausea or vomiting). 11/27/16   Nicholas Lose, MD  senna-docusate (SENOKOT-S) 8.6-50 MG tablet Take 2 tablets by mouth at bedtime. 02/14/17   Everitt Amber, MD  traMADol (ULTRAM) 50 MG tablet TAKE 1 TABLET BY MOUTH EVERY 6 HOURS AS NEEDED 02/05/17   Nicholas Lose, MD  zolpidem (AMBIEN) 10 MG tablet Take 1 tablet (  10 mg total) by mouth at bedtime. 11/27/16   Nicholas Lose, MD    Family History Family History  Problem Relation Age of Onset  . Breast cancer Maternal Aunt     Social History Social History  Substance Use Topics  . Smoking status: Former Smoker    Packs/day: 1.50    Years: 40.00    Types: Cigarettes    Quit date: 08/01/2012  . Smokeless tobacco: Never Used  . Alcohol use No     Allergies   Codeine and Morphine and related   Review of Systems Review of Systems  Constitutional: Negative for appetite change and fatigue.  HENT: Negative for congestion, ear discharge and sinus pressure.   Eyes: Negative for discharge.  Respiratory: Negative for cough.   Cardiovascular: Negative for chest pain.  Gastrointestinal: Negative for abdominal pain and diarrhea.  Genitourinary: Negative for frequency and hematuria.  Musculoskeletal: Negative for back pain.  Skin: Positive for rash. Negative for itching.  Neurological: Negative for seizures and headaches.  Psychiatric/Behavioral: Negative for hallucinations.     Physical Exam Updated Vital Signs Ht 5\' 2"  (1.575 m)   Wt 49.9 kg (110 lb)   LMP 05/20/2011   BMI 20.12 kg/m   Physical Exam  Constitutional: She is oriented to person, place, and time. She appears well-developed.  HENT:  Head:  Normocephalic.  Eyes: Conjunctivae are normal.  Neck: No tracheal deviation present.  Cardiovascular:  No murmur heard. Musculoskeletal: Normal range of motion.  Neurological: She is oriented to person, place, and time.  Skin: Skin is warm.  Patient with a tumor on the top of her head that is approximately 2 x 3 cm. It is bleeding slightly  Psychiatric: She has a normal mood and affect.     ED Treatments / Results  Labs (all labs ordered are listed, but only abnormal results are displayed) Labs Reviewed - No data to display  EKG  EKG Interpretation None       Radiology No results found.  Procedures .Marland KitchenLaceration Repair Date/Time: 02/26/2017 5:00 PM Performed by: Milton Ferguson Authorized by: Milton Ferguson   Comments:     Patient has a tumor on top of her head that was bleeding mildly. The bleeding was controlled with using silver nitrate sticks. Patient tolerated the procedure   (including critical care time)  Medications Ordered in ED Medications  silver nitrate applicators 99-83 % applicator (not administered)  HYDROmorphone (DILAUDID) tablet 2 mg (2 mg Oral Given 02/26/17 1644)     Initial Impression / Assessment and Plan / ED Course  I have reviewed the triage vital signs and the nursing notes.  Pertinent labs & imaging results that were available during my care of the patient were reviewed by me and considered in my medical decision making (see chart for details).     Bleeding from tumor on top of the head controlled with silver nitrate.  Final Clinical Impressions(s) / ED Diagnoses   Final diagnoses:  Skin lesion    New Prescriptions New Prescriptions   No medications on file     Milton Ferguson, MD 02/26/17 1701

## 2017-03-08 ENCOUNTER — Other Ambulatory Visit: Payer: Medicare Other

## 2017-03-08 ENCOUNTER — Ambulatory Visit: Payer: Medicare Other | Admitting: Hematology and Oncology

## 2017-03-08 ENCOUNTER — Encounter: Payer: Medicare Other | Admitting: Nutrition

## 2017-03-08 ENCOUNTER — Ambulatory Visit: Payer: Medicare Other

## 2017-03-08 NOTE — Assessment & Plan Note (Deleted)
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

## 2017-03-08 NOTE — Assessment & Plan Note (Deleted)
Bilateral breast cancers are usually diagnosed 2015 treated with surgery, radiation, anastrozole Metastatic breast cancer diagnosed 05/28/2015 as subpectoral mass, ER 5%, PR 0%, HER-2 negative, failed Xeloda and carboplatin and gemcitabine  CT CAP04/17/2017:RP and retrocrural lymphadenopathy, large uterine fibroids with necrosis;  MRI Abd: RP lymphadenopathy CT chest 11/20/16: Worsening bulky conglomeration of mediastinal and right hilar lymph nodes, lung nodules subcentimeter size slight increase, left breast mass was 3.7 x 5.8 cm now 5.4 x 7.4 cm  Current treatment: 1. Halaven days 1 and 8 every 3 weeks Today's cycle 4  (delayed due to surgery)  After this cycle, we can do scans

## 2017-03-09 ENCOUNTER — Ambulatory Visit (HOSPITAL_BASED_OUTPATIENT_CLINIC_OR_DEPARTMENT_OTHER): Payer: Medicare Other | Admitting: Hematology and Oncology

## 2017-03-09 ENCOUNTER — Ambulatory Visit: Payer: Medicare Other

## 2017-03-09 ENCOUNTER — Ambulatory Visit (HOSPITAL_BASED_OUTPATIENT_CLINIC_OR_DEPARTMENT_OTHER): Payer: Medicare Other

## 2017-03-09 ENCOUNTER — Other Ambulatory Visit: Payer: Self-pay | Admitting: Hematology and Oncology

## 2017-03-09 ENCOUNTER — Other Ambulatory Visit (HOSPITAL_BASED_OUTPATIENT_CLINIC_OR_DEPARTMENT_OTHER): Payer: Medicare Other

## 2017-03-09 ENCOUNTER — Encounter: Payer: Self-pay | Admitting: Hematology and Oncology

## 2017-03-09 ENCOUNTER — Telehealth: Payer: Self-pay

## 2017-03-09 DIAGNOSIS — C770 Secondary and unspecified malignant neoplasm of lymph nodes of head, face and neck: Secondary | ICD-10-CM

## 2017-03-09 DIAGNOSIS — C50912 Malignant neoplasm of unspecified site of left female breast: Secondary | ICD-10-CM

## 2017-03-09 DIAGNOSIS — C7951 Secondary malignant neoplasm of bone: Secondary | ICD-10-CM

## 2017-03-09 DIAGNOSIS — Z170 Estrogen receptor positive status [ER+]: Secondary | ICD-10-CM

## 2017-03-09 DIAGNOSIS — C50911 Malignant neoplasm of unspecified site of right female breast: Secondary | ICD-10-CM

## 2017-03-09 DIAGNOSIS — C541 Malignant neoplasm of endometrium: Secondary | ICD-10-CM

## 2017-03-09 DIAGNOSIS — C50919 Malignant neoplasm of unspecified site of unspecified female breast: Secondary | ICD-10-CM

## 2017-03-09 DIAGNOSIS — C50012 Malignant neoplasm of nipple and areola, left female breast: Secondary | ICD-10-CM

## 2017-03-09 DIAGNOSIS — C50011 Malignant neoplasm of nipple and areola, right female breast: Secondary | ICD-10-CM | POA: Diagnosis not present

## 2017-03-09 DIAGNOSIS — G8918 Other acute postprocedural pain: Secondary | ICD-10-CM

## 2017-03-09 LAB — CBC WITH DIFFERENTIAL/PLATELET
BASO%: 0.1 % (ref 0.0–2.0)
BASOS ABS: 0 10*3/uL (ref 0.0–0.1)
EOS ABS: 0.5 10*3/uL (ref 0.0–0.5)
EOS%: 4.7 % (ref 0.0–7.0)
HCT: 28.8 % — ABNORMAL LOW (ref 34.8–46.6)
HEMOGLOBIN: 9 g/dL — AB (ref 11.6–15.9)
LYMPH#: 2.9 10*3/uL (ref 0.9–3.3)
LYMPH%: 28 % (ref 14.0–49.7)
MCH: 20.7 pg — ABNORMAL LOW (ref 25.1–34.0)
MCHC: 31.3 g/dL — ABNORMAL LOW (ref 31.5–36.0)
MCV: 66.4 fL — AB (ref 79.5–101.0)
MONO#: 0.6 10*3/uL (ref 0.1–0.9)
MONO%: 5.3 % (ref 0.0–14.0)
NEUT%: 61.9 % (ref 38.4–76.8)
NEUTROS ABS: 6.5 10*3/uL (ref 1.5–6.5)
NRBC: 0 % (ref 0–0)
Platelets: 256 10*3/uL (ref 145–400)
RBC: 4.34 10*6/uL (ref 3.70–5.45)
RDW: 20.8 % — AB (ref 11.2–14.5)
WBC: 10.5 10*3/uL — AB (ref 3.9–10.3)

## 2017-03-09 LAB — COMPREHENSIVE METABOLIC PANEL
ALBUMIN: 2.7 g/dL — AB (ref 3.5–5.0)
ALT: 23 U/L (ref 0–55)
AST: 56 U/L — ABNORMAL HIGH (ref 5–34)
Alkaline Phosphatase: 77 U/L (ref 40–150)
Anion Gap: 7 mEq/L (ref 3–11)
BILIRUBIN TOTAL: 0.45 mg/dL (ref 0.20–1.20)
BUN: 17.6 mg/dL (ref 7.0–26.0)
CO2: 28 meq/L (ref 22–29)
CREATININE: 0.8 mg/dL (ref 0.6–1.1)
Calcium: 10.2 mg/dL (ref 8.4–10.4)
Chloride: 101 mEq/L (ref 98–109)
GLUCOSE: 101 mg/dL (ref 70–140)
Potassium: 4.4 mEq/L (ref 3.5–5.1)
SODIUM: 135 meq/L — AB (ref 136–145)
TOTAL PROTEIN: 7.2 g/dL (ref 6.4–8.3)

## 2017-03-09 MED ORDER — SODIUM CHLORIDE 0.9% FLUSH
10.0000 mL | INTRAVENOUS | Status: DC | PRN
  Administered 2017-03-09: 10 mL via INTRAVENOUS
  Filled 2017-03-09: qty 10

## 2017-03-09 MED ORDER — SODIUM CHLORIDE 0.9% FLUSH
3.0000 mL | Freq: Once | INTRAVENOUS | Status: DC | PRN
  Filled 2017-03-09: qty 10

## 2017-03-09 MED ORDER — FERUMOXYTOL INJECTION 510 MG/17 ML: 510.0000 mg | Freq: Once | INTRAVENOUS | Status: DC

## 2017-03-09 MED ORDER — SODIUM CHLORIDE 0.9 % IV SOLN
1.3000 mg/m2 | Freq: Once | INTRAVENOUS | Status: AC
  Administered 2017-03-09: 2 mg via INTRAVENOUS
  Filled 2017-03-09: qty 4

## 2017-03-09 MED ORDER — HEPARIN SOD (PORK) LOCK FLUSH 100 UNIT/ML IV SOLN
500.0000 [IU] | Freq: Once | INTRAVENOUS | Status: DC | PRN
  Filled 2017-03-09: qty 5

## 2017-03-09 MED ORDER — SODIUM CHLORIDE 0.9 % IV SOLN
1000.0000 mL | Freq: Once | INTRAVENOUS | Status: AC
  Administered 2017-03-09: 1000 mL via INTRAVENOUS

## 2017-03-09 MED ORDER — SODIUM CHLORIDE 0.9% FLUSH
10.0000 mL | INTRAVENOUS | Status: DC | PRN
  Filled 2017-03-09: qty 10

## 2017-03-09 MED ORDER — DENOSUMAB 120 MG/1.7ML ~~LOC~~ SOLN
120.0000 mg | Freq: Once | SUBCUTANEOUS | Status: DC
  Filled 2017-03-09: qty 1.7

## 2017-03-09 MED ORDER — PROCHLORPERAZINE MALEATE 10 MG PO TABS
ORAL_TABLET | ORAL | Status: AC
  Filled 2017-03-09: qty 1

## 2017-03-09 MED ORDER — PROCHLORPERAZINE MALEATE 10 MG PO TABS
10.0000 mg | ORAL_TABLET | Freq: Once | ORAL | Status: AC
  Administered 2017-03-09: 10 mg via ORAL

## 2017-03-09 MED ORDER — ALTEPLASE 2 MG IJ SOLR
2.0000 mg | Freq: Once | INTRAMUSCULAR | Status: DC | PRN
  Filled 2017-03-09: qty 2

## 2017-03-09 MED ORDER — SODIUM CHLORIDE 0.9% FLUSH
10.0000 mL | INTRAVENOUS | Status: DC | PRN
  Administered 2017-03-09: 10 mL
  Filled 2017-03-09: qty 10

## 2017-03-09 MED ORDER — DILAUDID 2 MG PO TABS: 2.0000 mg | ORAL_TABLET | ORAL | 0 refills | Status: DC | PRN

## 2017-03-09 MED ORDER — HEPARIN SOD (PORK) LOCK FLUSH 100 UNIT/ML IV SOLN
250.0000 [IU] | Freq: Once | INTRAVENOUS | Status: DC | PRN
  Filled 2017-03-09: qty 5

## 2017-03-09 MED ORDER — SODIUM CHLORIDE 0.9 % IV SOLN: Freq: Once | INTRAVENOUS | Status: DC

## 2017-03-09 MED FILL — HYDROmorphone HCL 2 MG TABS: 2 | 10 days supply | Qty: 60 | Fill #0

## 2017-03-09 NOTE — Telephone Encounter (Signed)
Pt contacted and she will be calling her friend to drive her here by [phone] to get lab/fl/md/ infusion. Infusion RN aware.

## 2017-03-09 NOTE — Patient Instructions (Signed)

## 2017-03-09 NOTE — Progress Notes (Signed)
Patient Care Team: [name], MD (Inactive) as PCP - General (Family Medicine) Rothbart, Cristopher Estimable, MD (Cardiology) [name], MD as Consulting Physician (Gastroenterology)  DIAGNOSIS:  Encounter Diagnoses  Name Primary?  . Bilateral malignant neoplasm involving both nipple and areola in female, unspecified estrogen receptor status ([place])   . Endometrial cancer ([place])   . Acute post-operative pain     SUMMARY OF ONCOLOGIC HISTORY:   Bilateral breast cancer ([place])   07/23/2013 Mammogram    Bilateral breast masses. 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 N1 M0 stage IIB ER 100%, PR 0%, HER-2       06/17/2014 -  Radiation Therapy    Adjuvant radiation therapy      09/14/2014 -  Anti-estrogen oral therapy    Anastrozole 1 mg daily      05/28/2015 Imaging    CT scans: Enlarging subpectoral masses 3.1 x 3.5 cm, posteriorly lower density mass 4.5 x 2.1 cm, several right-sided lung nodules right lower lobe 1.4 cm, 3  other right lung nodules, 1.7 cm right iliac bone lesion      06/21/2015 Procedure    Left subpectoral mass biopsy: Invasive high-grade ductal carcinoma ER 5%, PR 0%, HER-2 negative ratio 1.29      09/01/2015 Imaging    Left chest wall mass increased in size 7.2 x 5.1 cm, multiple subcutaneous nodules, increase in the lung nodules both in number as well as in the size of existing nodules      09/10/2015 -  Chemotherapy    Carboplatin, gemcitabine days 1 and 8 q 3 weeks, carboplatin discontinued for neuropathy (treatment break from 01/20/2016 to 03/24/2016); Added Carboplatin back 04/14/16 (for progression)      01/26/2016 Imaging    Marked improvement in size of lung nodules with many of the nodules resolved index right lower lobe nodule 1.9 x 1.8 cm is now 0.7 x 0.6 cm, reduction in the size of left breast mass 7.2 cm down to 2.6 cm, enlargement in the hypodense mass in the uterus      04/06/2016 Imaging    CT chest: Interval progression of pre-existing lung nodules new lung metastases (77m, 161m 2.9 cm, 1.6 cm), interval progression of disease in the left breast 4.2 cm (was 2.6 cm), additional nodules 2.3 cm and 3.2 cm; CT head: scalp mass 1.9 cm      06/27/2016 Imaging  Ct chest: Stable lesions in lt breast deep to Left pectoralis, cystic lesion loc ant in left breast inc compared to previous; decrease in lung nodules (17m to 3 mm; 12 mm to 7 mm, RML nodule 2.9 cm to 1.8 cm, RLL 146mto 7 mm)      11/14/2016 Imaging    CT CAP:RP and retrocrural lymphadenopathy, large uterine fibroids with necrosis; MRI Abd: RP lymphadenopathy CT chest 11/20/16: Worsening bulky conglomeration of mediastinal and right hilar lymph nodes, lung nodules subcentimeter size slight increase, left breast mass was 3.7 x 5.8 cm now 5.4 x 7.4 cm      12/08/2016 -  Chemotherapy    Palliative chemotherapy with Halaven days 1 and 8 every 3 weeks        [place]  02/13/2017 Surgery    Hysterectomy  with BSO: 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 Patient has had neuropathy in her feet but the neuropathy appears to be much better than before. She continues to have abdominal discomfort from the recent surgery but it is improving slowly every day. She requests prescription for Dilaudid which appears to be helping her significantly. She discontinued oxycodone.  REVIEW OF SYSTEMS:   Constitutional: Denies fevers, chills or abnormal weight loss Eyes: Denies blurriness of vision Ears, nose, mouth, throat, and face: Denies mucositis or sore throat, scalp lesion also gotten markedly larger Respiratory: Denies cough, dyspnea or wheezes Cardiovascular: Denies palpitation, chest discomfort Gastrointestinal: Recent hysterectomy Skin: Denies abnormal skin rashes Lymphatics: Denies new lymphadenopathy or easy bruising Neurological:Denies numbness, tingling or new weaknesses Behavioral/Psych: Mood is stable, no new changes  Extremities: No lower extremity edema Breast: Left chest wall mass is markedly gotten bigger All other systems were reviewed with the patient and are negative.  I have reviewed the past medical history, past surgical history, social history and family history with the patient and they are unchanged from previous note.  ALLERGIES:  is allergic to codeine and morphine and related.  MEDICATIONS:  Current Outpatient Prescriptions  Medication Sig Dispense Refill  . B Complex-C (SUPER B COMPLEX PO) Take 1 tablet by mouth daily.    . calcium-vitamin D (OSCAL-500) 500-400 MG-UNIT  tablet Take 1 tablet by mouth 2 (two) times daily. (Patient taking differently: Take 1 tablet by mouth 2 (two) times daily. Pt takes once daily.) 60 tablet 3  . Clobetasol Prop Emollient Base (CLOBETASOL PROPIONATE E) 0.05 % emollient cream Apply 1 application topically 2 (two) times daily. 30 g 0  . dicyclomine (BENTYL) 10 MG capsule TAKE 1 CAPSULE (10 MG TOTAL) BY MOUTH 3 (THREE) TIMES DAILY AS NEEDED FOR SPASMS. 30 capsule 0  . DILAUDID 2 MG tablet Take 1 tablet (2 mg total) by mouth every 4 (four) hours as needed for severe pain. 60 tablet 0  . ferrous gluconate (FERGON) 324 MG tablet Take 1 tablet (324 mg total) by mouth 2 (two) times daily with a meal. 60 tablet 3  . gabapentin (NEURONTIN) 300 MG capsule TAKE 2 CAPSULES (600 MG TOTAL) BY MOUTH 3 (THREE) TIMES DAILY. 180 capsule 2  . ibuprofen (ADVIL,MOTRIN) 600 MG tablet Take 1  tablet (600 mg total) by mouth every 8 (eight) hours as needed (mild pain). 30 tablet 0  . lidocaine-prilocaine (EMLA) cream Apply to affected area once 30 g 3  . LORazepam (ATIVAN) 1 MG tablet TAKE 1 TABLET BY MOUTH EVERYDAY AT BEDTIME 30 tablet 0  . nystatin (MYCOSTATIN) 100000 UNIT/ML suspension Take 5 mLs (500,000 Units total) by mouth 4 (four) times daily. 60 mL 0  . ondansetron (ZOFRAN) 8 MG tablet Take 1 tablet (8 mg total) by mouth 2 (two) times daily as needed (Nausea or vomiting). 30 tablet 1  . polyethylene glycol (MIRALAX / GLYCOLAX) packet Take 17 g by mouth 2 (two) times daily. 14 each 0  . prochlorperazine (COMPAZINE) 10 MG tablet Take 1 tablet (10 mg total) by mouth every 6 (six) hours as needed (Nausea or vomiting). 30 tablet 1  . senna-docusate (SENOKOT-S) 8.6-50 MG tablet Take 2 tablets by mouth at bedtime. 30 tablet 1  . traMADol (ULTRAM) 50 MG tablet TAKE 1 TABLET BY MOUTH EVERY 6 HOURS AS NEEDED 60 tablet 0  . zolpidem (AMBIEN) 10 MG tablet Take 1 tablet (10 mg total) by mouth at bedtime. 30 tablet 2   No current facility-administered medications  for this visit.     PHYSICAL EXAMINATION: ECOG PERFORMANCE STATUS: 1 - Symptomatic but completely ambulatory  Vitals:   03/09/17 1202  BP: 133/78  Pulse: 78  Resp: 18  Temp: 98.3 F (36.8 C)  SpO2: 100%   Filed Weights   03/09/17 1202  Weight: 117 lb 14.4 oz (53.5 kg)    GENERAL:alert, no distress and comfortable SKIN: skin color, texture, turgor are normal, no rashes or significant lesions EYES: normal, Conjunctiva are pink and non-injected, sclera clear OROPHARYNX:no exudate, no erythema and lips, buccal mucosa, and tongue normal  NECK: supple, thyroid normal size, non-tender, without nodularity LYMPH:  no palpable lymphadenopathy in the cervical, axillary or inguinal LUNGS: clear to auscultation and percussion with normal breathing effort HEART: regular rate & rhythm and no murmurs and no lower extremity edema ABDOMEN:abdomen soft, non-tender and normal bowel sounds MUSCULOSKELETAL:no cyanosis of digits and no clubbing  NEURO: alert & oriented x 3 with fluent speech, no focal motor/sensory deficits EXTREMITIES: No lower extremity edema BREAST:Large palpable mass with the extension into the left axilla from the left chest wall (exam performed in the presence of a chaperone)  LABORATORY DATA:  I have reviewed the data as listed   Chemistry      Component Value Date/Time   NA 136 02/14/2017 0612   NA 137 02/02/2017 0948   K 4.4 02/14/2017 0612   K 4.0 02/02/2017 0948   CL 100 (L) 02/14/2017 0612   CO2 27 02/14/2017 0612   CO2 28 02/02/2017 0948   BUN 13 02/14/2017 0612   BUN 12.2 02/02/2017 0948   CREATININE 0.82 02/14/2017 0612   CREATININE 0.8 02/02/2017 0948    3     Component Value Date/Time   CALCIUM 8.9 02/14/2017 0612   CALCIUM 9.9 02/02/2017 0948   ALKPHOS 67 02/02/2017 0948   AST 52 (H) 02/02/2017 0948   ALT 26 02/02/2017 0948   BILITOT 0.44 02/02/2017 0948       Lab Results  Component Value Date   WBC 10.5 (H) 03/09/2017   HGB 9.0 (L)  03/09/2017   HCT 28.8 (L) 03/09/2017   MCV 66.4 (L) 03/09/2017   PLT 256 03/09/2017   NEUTROABS 6.5 03/09/2017    ASSESSMENT & PLAN:  Bilateral breast cancer ([facility])  Bilateral breast cancers are usually diagnosed 2015 treated with surgery, radiation, anastrozole Metastatic breast cancer diagnosed 05/28/2015 as subpectoral mass, ER 5%, PR 0%, HER-2 negative, failed Xeloda and carboplatin and gemcitabine  CT CAP04/17/2017:RP and retrocrural lymphadenopathy, large uterine fibroids with necrosis;  MRI Abd: RP lymphadenopathy CT chest 11/20/16: Worsening bulky conglomeration of mediastinal and right hilar lymph nodes, lung nodules subcentimeter size slight increase, left breast mass was 3.7 x 5.8 cm now 5.4 x 7.4 cm  Current treatment: 1. Halaven days 1 and 8 every 3 weeks Today's cycle 4  (delayed due to surgery) After 2 more cycles of Halaven we will reassess a scalp lesion to see if it can be removed. At that time we will also plan to do the scans.  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 But because of her extensive chemotherapy history and having metastatic breast cancer, we are planning to treat her for the breast cancer alone at this time.  Return to clinic in one week for day 8 of Halaven and in 3 weeks for follow-up with me. I spent 25 minutes talking to the patient of which more than half was spent in counseling and coordination of care.  No orders of the defined types were placed in this encounter.  The patient has a good understanding of the overall plan. she agrees with it. she will call with any problems that may develop before the next visit here.   Paula Eisenmenger, MD 03/09/17

## 2017-03-09 NOTE — Telephone Encounter (Signed)
Pt called stating that no on from [facility] called to pick her up yesterday 8/9, and she was thinking her appts were for today 8/10. She is asking to receive chemo today. She missed yesterdays appts lab/flush/gudena/infusion/nutrition

## 2017-03-09 NOTE — Assessment & Plan Note (Signed)
Bilateral breast cancers are usually diagnosed 2015 treated with surgery, radiation, anastrozole Metastatic breast cancer diagnosed 05/28/2015 as subpectoral mass, ER 5%, PR 0%, HER-2 negative, failed Xeloda and carboplatin and gemcitabine  CT CAP04/17/2017:RP and retrocrural lymphadenopathy, large uterine fibroids with necrosis;  MRI Abd: RP lymphadenopathy CT chest 11/20/16: Worsening bulky conglomeration of mediastinal and right hilar lymph nodes, lung nodules subcentimeter size slight increase, left breast mass was 3.7 x 5.8 cm now 5.4 x 7.4 cm  Current treatment: 1. Halaven days 1 and 8 every 3 weeks Today's cycle 4  (delayed due to surgery)  After this cycle, we can do scans

## 2017-03-09 NOTE — Assessment & Plan Note (Addendum)
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 But because of her extensive chemotherapy history and having metastatic breast cancer, we are planning to treat her for the breast cancer alone at this time.

## 2017-03-09 NOTE — Patient Instructions (Signed)
[facility] Discharge Instructions for Patients Receiving Chemotherapy  Today you received the following chemotherapy agents :  Eribulin,  Xgeva.  To help prevent nausea and vomiting after your treatment, we encourage you to take your nausea medication as prescribed.   If you develop nausea and vomiting that is not controlled by your nausea medication, call the clinic.   BELOW ARE SYMPTOMS THAT SHOULD BE REPORTED IMMEDIATELY:  *FEVER GREATER THAN 100.5 F  *CHILLS WITH OR WITHOUT FEVER  NAUSEA AND VOMITING THAT IS NOT CONTROLLED WITH YOUR NAUSEA MEDICATION  *UNUSUAL SHORTNESS OF BREATH  *UNUSUAL BRUISING OR BLEEDING  TENDERNESS IN MOUTH AND THROAT WITH OR WITHOUT PRESENCE OF ULCERS  *URINARY PROBLEMS  *BOWEL PROBLEMS  UNUSUAL RASH Items with * indicate a potential emergency and should be followed up as soon as possible.  Feel free to call the clinic you have any questions or concerns. The clinic phone number is [phone].  Please show the [facility] at check-in to the Emergency Department and triage nurse.

## 2017-03-12 ENCOUNTER — Ambulatory Visit: Payer: Medicare Other | Attending: Gynecologic Oncology | Admitting: Gynecologic Oncology

## 2017-03-12 ENCOUNTER — Encounter: Payer: Self-pay | Admitting: Gynecologic Oncology

## 2017-03-12 VITALS — BP 137/75 | HR 88 | Temp 99.3°F | Resp 20 | Wt 115.7 lb

## 2017-03-12 DIAGNOSIS — Z853 Personal history of malignant neoplasm of breast: Secondary | ICD-10-CM | POA: Diagnosis not present

## 2017-03-12 DIAGNOSIS — J449 Chronic obstructive pulmonary disease, unspecified: Secondary | ICD-10-CM | POA: Diagnosis not present

## 2017-03-12 DIAGNOSIS — N852 Hypertrophy of uterus: Secondary | ICD-10-CM | POA: Insufficient documentation

## 2017-03-12 DIAGNOSIS — F419 Anxiety disorder, unspecified: Secondary | ICD-10-CM | POA: Diagnosis not present

## 2017-03-12 DIAGNOSIS — Z87891 Personal history of nicotine dependence: Secondary | ICD-10-CM | POA: Diagnosis not present

## 2017-03-12 DIAGNOSIS — C541 Malignant neoplasm of endometrium: Secondary | ICD-10-CM | POA: Insufficient documentation

## 2017-03-12 DIAGNOSIS — Z803 Family history of malignant neoplasm of breast: Secondary | ICD-10-CM | POA: Diagnosis not present

## 2017-03-12 DIAGNOSIS — Z7189 Other specified counseling: Secondary | ICD-10-CM

## 2017-03-12 NOTE — Patient Instructions (Signed)
Follow up with Dr. Lindi Adie.  No follow up with Dr. Denman George necessary at this time.  Please call for any questions or concerns related to your surgery.

## 2017-03-14 ENCOUNTER — Encounter (HOSPITAL_COMMUNITY): Payer: Self-pay | Admitting: Emergency Medicine

## 2017-03-14 ENCOUNTER — Emergency Department (HOSPITAL_COMMUNITY)
Admission: EM | Admit: 2017-03-14 | Discharge: 2017-03-14 | Disposition: A | Discharge status: Home / Self Care | Payer: Medicare Other | Attending: Emergency Medicine | Admitting: Emergency Medicine

## 2017-03-14 ENCOUNTER — Encounter: Payer: Self-pay | Admitting: Gynecologic Oncology

## 2017-03-14 DIAGNOSIS — L989 Disorder of the skin and subcutaneous tissue, unspecified: Secondary | ICD-10-CM | POA: Insufficient documentation

## 2017-03-14 DIAGNOSIS — C3490 Malignant neoplasm of unspecified part of unspecified bronchus or lung: Secondary | ICD-10-CM | POA: Insufficient documentation

## 2017-03-14 DIAGNOSIS — Z87891 Personal history of nicotine dependence: Secondary | ICD-10-CM | POA: Diagnosis not present

## 2017-03-14 DIAGNOSIS — C541 Malignant neoplasm of endometrium: Secondary | ICD-10-CM | POA: Diagnosis not present

## 2017-03-14 DIAGNOSIS — C50919 Malignant neoplasm of unspecified site of unspecified female breast: Secondary | ICD-10-CM

## 2017-03-14 DIAGNOSIS — J449 Chronic obstructive pulmonary disease, unspecified: Secondary | ICD-10-CM | POA: Insufficient documentation

## 2017-03-14 DIAGNOSIS — C770 Secondary and unspecified malignant neoplasm of lymph nodes of head, face and neck: Secondary | ICD-10-CM | POA: Insufficient documentation

## 2017-03-14 DIAGNOSIS — Z79899 Other long term (current) drug therapy: Secondary | ICD-10-CM | POA: Diagnosis not present

## 2017-03-14 MED ORDER — SILVER NITRATE-POT NITRATE 75-25 % EX MISC
1.0000 "application " | Freq: Once | CUTANEOUS | Status: AC
  Administered 2017-03-14: 1 via TOPICAL
  Filled 2017-03-14: qty 2

## 2017-03-14 MED ORDER — HYDROMORPHONE HCL 1 MG/ML IJ SOLN
1.0000 mg | Freq: Once | INTRAMUSCULAR | Status: AC
  Administered 2017-03-14: 1 mg via INTRAMUSCULAR
  Filled 2017-03-14: qty 1

## 2017-03-14 NOTE — ED Triage Notes (Signed)
Pt C/O of tumor on top of head is bleeding. Pt has bandage on it, bleeding controlled.

## 2017-03-14 NOTE — Progress Notes (Signed)
Consult Note: Gyn-Onc  Consult was requested by Dr. Sonny Dandy and Dr Garwin Brothers for the evaluation of Paula Navarro 66 y.o. female  CC:  Chief Complaint  Patient presents with  . Endometrial cancer [facility])    Assessment/Plan:  Ms. JEFF MCCALLUM  is a 66 y.o.  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 This cancer is typically treated with adjuvant carboplatin and paclitaxel chemotherapy and vaginal brachytherapy. However, given the widely metastatic and progressive nature of her breast cancer, I do not feel that changing the course of therapy to treat the residual retroperitoneal nodal disease is more important than treating the more symptomatic breast cancer lesions in the scalp and chest wall. I explained again to Ms Alegria that her cancer is not curable, but that out goal of therapy/surgery had been to palliate her vaginal bleeding symptoms. I am not certain that she fully grasps her prognosis.   She is cleared from a surgical stand-point to resume therapy.  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  The patient has a history of bilatera breast cancer diagnosed in January 2015 and treated with neoadjuvant chemotherapy followed by surgery and adjuvant radiation therapy followed by anti-estrogen oral therapy. Recurrence (left chest wall, lungs, bone) was diagnosed in November, 2016. From February, 2017 until June, 2017 she received salvage chemotherapy with carboplatin and gemcitabine with improvement in her metastatic sites. In June, 2017 she was noted to have thickened endometrium on imaging. She declined surgical intervention at the time as she was concerned about caring for her ailing mother. Subsequently a scalp mass developed as an additional site of  progession and new progression developedi n the lungs and left chest wall.  She received no intervention for many months as she was caring for her mother.  A CT abdo/pelvis on 11/14/16 showed an enlarged uterus (12cm) with bulky intrauterine mass and worsening conglomeration of mediastinal and right hilar nodes. THere was also bulky and necrotic retroperitoneal adenopathy encasing the venacava.  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   She was started on Halaven palliative chemotherapy on 12/08/16. She received 3 cycles of Halaven with day 1 of cycle 3 on 01/26/17.   CT abd/pelvis/chest on 02/05/17 showed reduction in size of breast cancer metastatic disease. Stable appearance of lymphadenopathy ( which is likely secondary to metastatic carcinosarcoma).   Interval History:  On 02/13/17 she underwent robotic assisted total hysterectomy for a uterus >250gm, BSO and minilaparotomy for specimen delivery. The goal of the surgery was to palliate her bleeding (not to stage the cancer) as she has progressive widely metastatic breast cancer.  Final pathology revealed a 7+cm high grade serous endometrial cancer with full thickness myometrial involvement and bilateral ovarian involvement. Clinically the pelvic and PA nodes are involved on imaging.  Postoperatively she did well with no surgery related complaints.  She has noted that her scalp and chest wall breast cancer metastases have been growing noticeably since surgery and being off chemotherapy. These cause pain.    Current Meds:  Outpatient Encounter Prescriptions as of 03/12/2017  Medication Sig  . B Complex-C (SUPER B COMPLEX PO) Take 1 tablet by mouth  daily.  . calcium-vitamin D (OSCAL-500) 500-400 MG-UNIT tablet Take 1 tablet by mouth 2 (two) times daily. (Patient taking differently:  Take 1 tablet by mouth 2 (two) times daily. Pt takes once daily.)  . Clobetasol Prop Emollient Base (CLOBETASOL PROPIONATE E) 0.05 % emollient cream Apply 1 application topically 2 (two) times daily.  Marland Kitchen dicyclomine (BENTYL) 10 MG capsule TAKE 1 CAPSULE (10 MG TOTAL) BY MOUTH 3 (THREE) TIMES DAILY AS NEEDED FOR SPASMS.  Marland Kitchen DILAUDID 2 MG tablet Take 1 tablet (2 mg total) by mouth every 4 (four) hours as needed for severe pain.  . ferrous gluconate (FERGON) 324 MG tablet Take 1 tablet (324 mg total) by mouth 2 (two) times daily with a meal.  . gabapentin (NEURONTIN) 300 MG capsule TAKE 2 CAPSULES (600 MG TOTAL) BY MOUTH 3 (THREE) TIMES DAILY.  Marland Kitchen ibuprofen (ADVIL,MOTRIN) 600 MG tablet Take 1 tablet (600 mg total) by mouth every 8 (eight) hours as needed (mild pain).  Marland Kitchen lidocaine-prilocaine (EMLA) cream Apply to affected area once  . LORazepam (ATIVAN) 1 MG tablet TAKE 1 TABLET BY MOUTH EVERYDAY AT BEDTIME  . nystatin (MYCOSTATIN) 100000 UNIT/ML suspension Take 5 mLs (500,000 Units total) by mouth 4 (four) times daily.  . ondansetron (ZOFRAN) 8 MG tablet Take 1 tablet (8 mg total) by mouth 2 (two) times daily as needed (Nausea or vomiting).  . polyethylene glycol (MIRALAX / GLYCOLAX) packet Take 17 g by mouth 2 (two) times daily.  . prochlorperazine (COMPAZINE) 10 MG tablet Take 1 tablet (10 mg total) by mouth every 6 (six) hours as needed (Nausea or vomiting).  Marland Kitchen senna-docusate (SENOKOT-S) 8.6-50 MG tablet Take 2 tablets by mouth at bedtime.  . traMADol (ULTRAM) 50 MG tablet TAKE 1 TABLET BY MOUTH EVERY 6 HOURS AS NEEDED  . zolpidem (AMBIEN) 10 MG tablet Take 1 tablet (10 mg total) by mouth at bedtime.   No facility-administered encounter medications on file as of 03/12/2017.     Allergy:  Allergies  Allergen Reactions  . Codeine Other (See Comments)    Hot in chest  . Morphine And Related Itching and Rash    Social Hx:   Social History   Social History  . Marital status: Widowed    Spouse  name: N/A  . Number of children: N/A  . Years of education: N/A   Occupational History  . Not on file.   Social History Main Topics  . Smoking status: Former Smoker    Packs/day: 1.50    Years: 40.00    Types: Cigarettes    Quit date: 08/01/2012  . Smokeless tobacco: Never Used  . Alcohol use No  . Drug use: No  . Sexual activity: Not Currently   Other Topics Concern  . Not on file   Social History Narrative  . No narrative on file    Past Surgical Hx:  Past Surgical History:  Procedure Laterality Date  . ASD REPAIR, OSTIUM PRIMUM  1996   dr Roxy Horseman  . AXILLARY LYMPH NODE DISSECTION Bilateral 04/08/2014   Procedure:  BILATERAL AXILLARY LYMPH NODE DISSECTION;  Surgeon: Autumn Messing III, MD;  Location: [place];  Service: General;  Laterality: Bilateral;  . BREAST BIOPSY Bilateral   . BREAST LUMPECTOMY WITH RADIOACTIVE SEED LOCALIZATION Bilateral 04/08/2014   Procedure: BILATERAL  RADIOACTIVE SEED LOCALIZATION LUMPECTOMY ;  Surgeon: Autumn Messing III, MD;  Location: [place];  Service: General;  Laterality: Bilateral;  . CESAREAN SECTION  x3  . CHOLECYSTECTOMY    . LAPAROTOMY N/A 02/13/2017   Procedure: MINI EXPLORATORY LAPAROTOMY;  Surgeon: Everitt Amber, MD;  Location: [facility];  Service: Gynecology;  Laterality: N/A;  . open heart surgery    . PORT A CATH REVISION  1/15   put in   . ROBOTIC ASSISTED TOTAL HYSTERECTOMY WITH BILATERAL SALPINGO OOPHERECTOMY Bilateral 02/13/2017   Procedure: XI ROBOTIC ASSISTED TOTAL HYSTERECTOMY WITH BILATERAL SALPINGO OOPHORECTOMY WITH LYSIS OF ADHESIONS, UTERUS GREATER THAN 250GRAMS;  Surgeon: Everitt Amber, MD;  Location[facility];  Service: Gynecology;  Laterality: Bilateral;  . TUBAL LIGATION      Past Medical Hx:  Past Medical History:  Diagnosis Date  . Anemia   . Anxiety   . Atrial septal defect 1996   Surgical repair in 1996  . Breast cancer ([place]) dx'd 06/2013  . Chest pain    Admitted to [facility] in 09/2011;  refused stress test  . Chronic bronchitis   . Chronic pain   . COPD (chronic obstructive pulmonary disease) ([place])    on xray  . Metastatic cancer to lung ([place]) dx'd 2017  . Palpitation    Tachycardia reported by monitor clerk during a symptomatic spell  . Radiation 06/30/14-08/17/14   Bilateral Breast  . Tobacco abuse    60 pack years; 1.5 packs per day  . Wears dentures    top    Past Gynecological History:  C/s x 3 Patient's last menstrual period was 05/20/2011.  Family Hx:  Family History  Problem Relation Age of Onset  . Breast cancer Maternal Aunt     Review of Systems:  Constitutional  Feels well,    ENT Normal appearing ears and nares bilaterally Skin/Breast  + scalp and left chest wall masses Cardiovascular  No chest pain, shortness of breath, or edema  Pulmonary  No cough or wheeze.  Gastro Intestinal  No nausea, vomitting, or diarrhoea. No bright red blood per rectum, no abdominal pain, change in bowel movement, or constipation.  Genito Urinary  No frequency, urgency, dysuria, no bleeding Musculo Skeletal  No myalgia, arthralgia, joint swelling or pain  Neurologic  No weakness, numbness, change in gait,  Psychology  No depression, anxiety, insomnia.   Vitals:  Blood pressure 137/75, pulse 88, temperature 99.3 F (37.4 C), resp. rate 20, weight 115 lb 11.2 oz (52.5 kg), last menstrual period 05/20/2011, SpO2 100 %.  Physical Exam: WD in NAD Neck  Supple NROM, without any enlargements.  Lymph Node Survey + left axillary adenopathy. Cardiovascular  Pulse normal rate, regularity and rhythm. S1 and S2 normal.  Lungs  Clear to auscultation bilateraly, without wheezes/crackles/rhonchi. Good air movement.  Skin  + 15cm left anterior upper chest wall mass pointing (almost erupting through) to the skin with induration spreading down the chest wall. Psychiatry  Alert and oriented to person, place, and time  Abdomen  Normoactive bowel sounds, abdomen soft,  non-tender and nonobese without evidence of hernia. Well healed robotic and suprapubic vertical incisions. No infection,drainage or induration. Back No CVA tenderness Genito Urinary  Vulva/vagina: Normal external female genitalia.  No lesions. No discharge or bleeding.  Bladder/urethra:  No lesions or masses, well supported bladder  Vagina: well healed vaginal cuff, no masses.   Adnexa: no discrete masses. Rectal  deferred  Extremities  No bilateral cyanosis, clubbing or edema.   30 minutes of direct face to face counseling time was spent with the patient. This included discussion about prognosis, therapy recommendations and postoperative side effects  and are beyond the scope of routine postoperative care.  [name], MD  03/14/2017, 10:00 AM

## 2017-03-14 NOTE — ED Provider Notes (Signed)
[facility] DEPT Provider Note   CSN: [id number] Arrival date & time: 03/14/17  2000     History   Chief Complaint Chief Complaint  Patient presents with  . Wound Check    HPI Paula Navarro is a 66 y.o. female.  Pt has a tumor on the top of her head that started bleeding yesterday.  She has had this problem in the past.  She said they can't remove it until it has shrunk with chemo.  The pt was here on 7/30 for the same.      Past Medical History:  Diagnosis Date  . Anemia   . Anxiety   . Atrial septal defect 1996   Surgical repair in 1996  . Breast cancer ([place]) dx'd 06/2013  . Chest pain    Admitted to [facility] in 09/2011; refused stress test  . Chronic bronchitis   . Chronic pain   . COPD (chronic obstructive pulmonary disease) ([place])    on xray  . Metastatic cancer to lung ([place]) dx'd 2017  . Palpitation    Tachycardia reported by monitor clerk during a symptomatic spell  . Radiation 06/30/14-08/17/14   Bilateral Breast  . Tobacco abuse    60 pack years; 1.5 packs per day  . Wears dentures    top    Patient Active Problem List   Diagnosis Date Noted  . Endometrial cancer ([place]) 12/18/2016  . Protein-calorie malnutrition, severe 11/15/2016  . UTI (urinary tract infection) 11/14/2016  . COPD (chronic obstructive pulmonary disease) ([place])   . 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 Chemotherapy-induced thrombocytopenia 06/13/2016  . Scalp lesion 04/01/2016  . Encounter for central line care 12/08/2015  . Port catheter in place 11/19/2015  . Insomnia 11/01/2015  . Microcytic anemia 09/28/2015  . Lung metastases ([place]) 09/10/2015  . Bone metastases ([place]) 07/20/2015  . Goals of care, counseling/discussion 07/13/2015  . Metastatic breast cancer ([place]) 06/29/2015  . Chronic pain 03/16/2015  . Vaginal bleeding 09/24/2014  . Postherpetic neuralgia 09/03/2014  . Lymphedema 09/03/2014  . Suspected herpes zoster left C5 distribution 08/14/2014  .  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 Anxiety 03/03/2014  . Bilateral breast cancer ([place]) 08/11/2013  . Palpitation   . Chest pain   . Atrial septal defect   . Laboratory test 11/25/2011  . Chronic bronchitis   . Tobacco abuse     Past Surgical History:  Procedure Laterality Date  . ASD REPAIR, OSTIUM PRIMUM  1996   dr Roxy Horseman  . AXILLARY LYMPH NODE DISSECTION Bilateral 04/08/2014   Procedure:  BILATERAL AXILLARY LYMPH NODE DISSECTION;  Surgeon: Autumn Messing III, MD;  Location: [place];  Service: General;  Laterality: Bilateral;  . BREAST BIOPSY Bilateral   . BREAST LUMPECTOMY WITH RADIOACTIVE SEED LOCALIZATION Bilateral 04/08/2014   Procedure: BILATERAL  RADIOACTIVE SEED LOCALIZATION LUMPECTOMY ;  Surgeon: Autumn Messing III, MD;  Location: [place];  Service: General;  Laterality: Bilateral;  . CESAREAN SECTION     x3  . CHOLECYSTECTOMY    . LAPAROTOMY N/A 02/13/2017   Procedure: MINI EXPLORATORY LAPAROTOMY;  Surgeon: Everitt Amber, MD;  Location[facility];  Service: Gynecology;  Laterality: N/A;  . open heart surgery    . PORT A CATH REVISION  1/15   put in   . ROBOTIC ASSISTED TOTAL HYSTERECTOMY WITH BILATERAL SALPINGO OOPHERECTOMY Bilateral 02/13/2017   Procedure: XI ROBOTIC ASSISTED TOTAL HYSTERECTOMY WITH BILATERAL SALPINGO OOPHORECTOMY WITH LYSIS OF ADHESIONS,  UTERUS GREATER THAN 250GRAMS;  Surgeon: Everitt Amber, MD;  [facility];  Service: Gynecology;  Laterality: Bilateral;  . TUBAL LIGATION      OB History    Gravida Para Term Preterm AB Living   4 3 3          SAB TAB Ectopic Multiple Live Births                   Home Medications    Prior to Admission medications   Medication Sig Start Date End Date Taking? Authorizing Provider  B Complex-C (SUPER B COMPLEX PO) Take 1 tablet by mouth daily.    [provider]  calcium-vitamin D (OSCAL-500) 500-400 MG-UNIT tablet Take 1 tablet by mouth 2 (two) times  daily. Patient taking differently: Take 1 tablet by mouth 2 (two) times daily. Pt takes once daily. 08/18/15   Nicholas Lose, MD  Clobetasol Prop Emollient Base (CLOBETASOL PROPIONATE E) 0.05 % emollient cream Apply 1 application topically 2 (two) times daily. 01/10/17   Nicholas Lose, MD  dicyclomine (BENTYL) 10 MG capsule TAKE 1 CAPSULE (10 MG TOTAL) BY MOUTH 3 (THREE) TIMES DAILY AS NEEDED FOR SPASMS. 02/01/17   Causey, Charlestine Massed, NP  DILAUDID 2 MG tablet Take 1 tablet (2 mg total) by mouth every 4 (four) hours as needed for severe pain. 03/09/17   Nicholas Lose, MD  ferrous gluconate (FERGON) 324 MG tablet Take 1 tablet (324 mg total) by mouth 2 (two) times daily with a meal. 02/14/17   Everitt Amber, MD  gabapentin (NEURONTIN) 300 MG capsule TAKE 2 CAPSULES (600 MG TOTAL) BY MOUTH 3 (THREE) TIMES DAILY. 02/28/17   Nicholas Lose, MD  ibuprofen (ADVIL,MOTRIN) 600 MG tablet Take 1 tablet (600 mg total) by mouth every 8 (eight) hours as needed (mild pain). 02/14/17   Everitt Amber, MD  lidocaine-prilocaine (EMLA) cream Apply to affected area once 11/27/16   Nicholas Lose, MD  LORazepam (ATIVAN) 1 MG tablet TAKE 1 TABLET BY MOUTH EVERYDAY AT BEDTIME 02/05/17   Nicholas Lose, MD  nystatin (MYCOSTATIN) 100000 UNIT/ML suspension Take 5 mLs (500,000 Units total) by mouth 4 (four) times daily. 01/26/17   Causey, Charlestine Massed, NP  ondansetron (ZOFRAN) 8 MG tablet Take 1 tablet (8 mg total) by mouth 2 (two) times daily as needed (Nausea or vomiting). 11/27/16   Nicholas Lose, MD  polyethylene glycol (MIRALAX / GLYCOLAX) packet Take 17 g by mouth 2 (two) times daily. 11/16/16   Eber Jones, MD  prochlorperazine (COMPAZINE) 10 MG tablet Take 1 tablet (10 mg total) by mouth every 6 (six) hours as needed (Nausea or vomiting). 11/27/16   Nicholas Lose, MD  senna-docusate (SENOKOT-S) 8.6-50 MG tablet Take 2 tablets by mouth at bedtime. 02/14/17   Everitt Amber, MD  traMADol (ULTRAM) 50 MG tablet TAKE 1 TABLET BY  MOUTH EVERY 6 HOURS AS NEEDED 02/05/17   Nicholas Lose, MD  zolpidem (AMBIEN) 10 MG tablet Take 1 tablet (10 mg total) by mouth at bedtime. 11/27/16   Nicholas Lose, MD    Family History Family History  Problem Relation Age of Onset  . Breast cancer Maternal Aunt     Social History Social History  Substance Use Topics  . Smoking status: Former Smoker    Packs/day: 1.50    Years: 40.00    Types: Cigarettes    Quit date: 08/01/2012  . Smokeless tobacco: Never Used  . Alcohol use No     Allergies   Codeine and  Morphine and related   Review of Systems Review of Systems  Skin: Positive for wound.  All other systems reviewed and are negative.    Physical Exam Updated Vital Signs BP 132/71 (BP Location: Right Arm)   Pulse 86   Temp 99 F (37.2 C) (Oral)   Resp 18   LMP 05/20/2011   SpO2 97%   Physical Exam  Constitutional: She is oriented to person, place, and time. She appears well-developed and well-nourished.  HENT:  Head: Normocephalic.    Right Ear: External ear normal.  Left Ear: External ear normal.  Nose: Nose normal.  Mouth/Throat: Oropharynx is clear and moist.  Eyes: Pupils are equal, round, and reactive to light. Conjunctivae and EOM are normal.  Neck: Normal range of motion. Neck supple.  Cardiovascular: Normal rate, regular rhythm, normal heart sounds and intact distal pulses.   Pulmonary/Chest: Effort normal and breath sounds normal.  Abdominal: Soft. Bowel sounds are normal.  Musculoskeletal: Normal range of motion.  Neurological: She is alert and oriented to person, place, and time.  Skin: Skin is warm. Capillary refill takes less than 2 seconds.     Psychiatric: She has a normal mood and affect. Her behavior is normal. Judgment and thought content normal.  Nursing note and vitals reviewed.    ED Treatments / Results  Labs (all labs ordered are listed, but only abnormal results are displayed) Labs Reviewed - No data to display  EKG  EKG  Interpretation None       Radiology No results found.  Procedures .Marland KitchenLaceration Repair Date/Time: 03/14/2017 8:31 PM Performed by: Isla Pence Authorized by: Isla Pence   Consent:    Consent obtained:  Verbal   Consent given by:  Patient   Risks discussed:  Infection and pain   Alternatives discussed:  No treatment Anesthesia (see MAR for exact dosages):    Anesthesia method:  None Laceration details:    Location:  Scalp   Scalp location:  R parietal Exploration:    Hemostasis achieved with:  Cautery Post-procedure details:    Dressing:  Antibiotic ointment and non-adherent dressing   Patient tolerance of procedure:  Tolerated well, no immediate complications Comments:     Silver nitrate applied to oozing areas on tumor.  Good hemostasis.   (including critical care time)  Medications Ordered in ED Medications  silver nitrate applicators applicator 1 application (not administered)  HYDROmorphone (DILAUDID) injection 1 mg (not administered)     Initial Impression / Assessment and Plan / ED Course  I have reviewed the triage vital signs and the nursing notes.  Pertinent labs & imaging results that were available during my care of the patient were reviewed by me and considered in my medical decision making (see chart for details).     Pt tolerated well.  She is stable for d/c.  Final Clinical Impressions(s) / ED Diagnoses   Final diagnoses:  Skin lesion  Metastatic breast cancer [facility])    New Prescriptions New Prescriptions   No medications on file     Isla Pence, MD 03/14/17 2033

## 2017-03-15 ENCOUNTER — Encounter: Payer: Self-pay | Admitting: *Deleted

## 2017-03-15 ENCOUNTER — Other Ambulatory Visit: Payer: Self-pay | Admitting: *Deleted

## 2017-03-16 ENCOUNTER — Other Ambulatory Visit (HOSPITAL_BASED_OUTPATIENT_CLINIC_OR_DEPARTMENT_OTHER): Payer: Medicare Other

## 2017-03-16 ENCOUNTER — Ambulatory Visit (HOSPITAL_BASED_OUTPATIENT_CLINIC_OR_DEPARTMENT_OTHER): Payer: Medicare Other

## 2017-03-16 ENCOUNTER — Ambulatory Visit (HOSPITAL_COMMUNITY)
Admission: RE | Admit: 2017-03-16 | Discharge: 2017-03-16 | Disposition: A | Discharge status: Home / Self Care | Payer: Medicare Other | Source: Ambulatory Visit | Attending: Oncology | Admitting: Oncology

## 2017-03-16 ENCOUNTER — Telehealth: Payer: Self-pay | Admitting: *Deleted

## 2017-03-16 ENCOUNTER — Other Ambulatory Visit: Payer: Self-pay | Admitting: *Deleted

## 2017-03-16 VITALS — BP 140/80 | HR 78 | Temp 98.5°F | Resp 18 | Wt 110.0 lb

## 2017-03-16 DIAGNOSIS — Z170 Estrogen receptor positive status [ER+]: Secondary | ICD-10-CM

## 2017-03-16 DIAGNOSIS — C770 Secondary and unspecified malignant neoplasm of lymph nodes of head, face and neck: Secondary | ICD-10-CM

## 2017-03-16 DIAGNOSIS — C7951 Secondary malignant neoplasm of bone: Secondary | ICD-10-CM

## 2017-03-16 DIAGNOSIS — C50911 Malignant neoplasm of unspecified site of right female breast: Secondary | ICD-10-CM

## 2017-03-16 DIAGNOSIS — C50011 Malignant neoplasm of nipple and areola, right female breast: Secondary | ICD-10-CM

## 2017-03-16 DIAGNOSIS — C50012 Malignant neoplasm of nipple and areola, left female breast: Secondary | ICD-10-CM

## 2017-03-16 DIAGNOSIS — C50912 Malignant neoplasm of unspecified site of left female breast: Secondary | ICD-10-CM

## 2017-03-16 DIAGNOSIS — C50919 Malignant neoplasm of unspecified site of unspecified female breast: Secondary | ICD-10-CM

## 2017-03-16 DIAGNOSIS — C541 Malignant neoplasm of endometrium: Secondary | ICD-10-CM

## 2017-03-16 LAB — CBC WITH DIFFERENTIAL/PLATELET
BASO%: 0.6 % (ref 0.0–2.0)
Basophils Absolute: 0 10*3/uL (ref 0.0–0.1)
EOS ABS: 0.1 10*3/uL (ref 0.0–0.5)
EOS%: 1.1 % (ref 0.0–7.0)
HCT: 31.4 % — ABNORMAL LOW (ref 34.8–46.6)
HEMOGLOBIN: 10 g/dL — AB (ref 11.6–15.9)
LYMPH%: 44.9 % (ref 14.0–49.7)
MCH: 21 pg — ABNORMAL LOW (ref 25.1–34.0)
MCHC: 31.8 g/dL (ref 31.5–36.0)
MCV: 66.2 fL — AB (ref 79.5–101.0)
MONO#: 0.7 10*3/uL (ref 0.1–0.9)
MONO%: 12.3 % (ref 0.0–14.0)
NEUT%: 41.1 % (ref 38.4–76.8)
NEUTROS ABS: 2.4 10*3/uL (ref 1.5–6.5)
Platelets: 264 10*3/uL (ref 145–400)
RBC: 4.73 10*6/uL (ref 3.70–5.45)
RDW: 21.3 % — AB (ref 11.2–14.5)
WBC: 5.8 10*3/uL (ref 3.9–10.3)
lymph#: 2.6 10*3/uL (ref 0.9–3.3)

## 2017-03-16 LAB — COMPREHENSIVE METABOLIC PANEL
ALK PHOS: 65 U/L (ref 40–150)
ALT: 27 U/L (ref 0–55)
AST: 52 U/L — AB (ref 5–34)
Albumin: 3 g/dL — ABNORMAL LOW (ref 3.5–5.0)
Anion Gap: 10 mEq/L (ref 3–11)
BILIRUBIN TOTAL: 0.6 mg/dL (ref 0.20–1.20)
BUN: 15.3 mg/dL (ref 7.0–26.0)
CO2: 28 meq/L (ref 22–29)
CREATININE: 0.8 mg/dL (ref 0.6–1.1)
Calcium: 10.4 mg/dL (ref 8.4–10.4)
Chloride: 98 mEq/L (ref 98–109)
GLUCOSE: 121 mg/dL (ref 70–140)
Potassium: 3.7 mEq/L (ref 3.5–5.1)
SODIUM: 136 meq/L (ref 136–145)
TOTAL PROTEIN: 7.8 g/dL (ref 6.4–8.3)

## 2017-03-16 MED ORDER — PROCHLORPERAZINE MALEATE 10 MG PO TABS
ORAL_TABLET | ORAL | Status: AC
  Filled 2017-03-16: qty 1

## 2017-03-16 MED ORDER — PROCHLORPERAZINE MALEATE 10 MG PO TABS
10.0000 mg | ORAL_TABLET | Freq: Once | ORAL | Status: AC
  Administered 2017-03-16: 10 mg via ORAL

## 2017-03-16 MED ORDER — SODIUM CHLORIDE 0.9 % IV SOLN
Freq: Once | INTRAVENOUS | Status: AC
  Administered 2017-03-16: 10:00:00 via INTRAVENOUS

## 2017-03-16 MED ORDER — HEPARIN SOD (PORK) LOCK FLUSH 100 UNIT/ML IV SOLN
500.0000 [IU] | Freq: Once | INTRAVENOUS | Status: AC | PRN
Start: 2017-03-16 — End: 2017-03-16
  Administered 2017-03-16: 500 [IU]
  Filled 2017-03-16: qty 5

## 2017-03-16 MED ORDER — SODIUM CHLORIDE 0.9 % IV SOLN
1.3000 mg/m2 | Freq: Once | INTRAVENOUS | Status: AC
  Administered 2017-03-16: 2 mg via INTRAVENOUS
  Filled 2017-03-16: qty 4

## 2017-03-16 MED ORDER — SODIUM CHLORIDE 0.9% FLUSH
10.0000 mL | INTRAVENOUS | Status: DC | PRN
Start: 2017-03-16 — End: 2017-06-07
  Administered 2017-03-16: 10 mL
  Filled 2017-03-16: qty 10

## 2017-03-16 NOTE — Progress Notes (Signed)
Pt presents ambulatory with new onset r lower abdominal pain x 2 days rated as a 6/10, "ache". Her last Dilaudid was at 5 am today. She is not eating much and LBM 1 week ago. She has lost 5 lbs in 4 days. VSS in no acute distress.  Abd soft but does acknowledge pain when palpated right lower abd near incision site. Robotic hysterectomy in July. Call to [facility].

## 2017-03-16 NOTE — Progress Notes (Signed)
Pt took her dilaudid at 1015.

## 2017-03-16 NOTE — Patient Instructions (Signed)
[facility] Discharge Instructions for Patients Receiving Chemotherapy  Today you received the following chemotherapy agents halaven  To help prevent nausea and vomiting after your treatment, we encourage you to take your nausea medication as prescribed.  If you develop nausea and vomiting that is not controlled by your nausea medication, call the clinic.   BELOW ARE SYMPTOMS THAT SHOULD BE REPORTED IMMEDIATELY:  *FEVER GREATER THAN 100.5 F  *CHILLS WITH OR WITHOUT FEVER  NAUSEA AND VOMITING THAT IS NOT CONTROLLED WITH YOUR NAUSEA MEDICATION  *UNUSUAL SHORTNESS OF BREATH  *UNUSUAL BRUISING OR BLEEDING  TENDERNESS IN MOUTH AND THROAT WITH OR WITHOUT PRESENCE OF ULCERS  *URINARY PROBLEMS  *BOWEL PROBLEMS  UNUSUAL RASH Items with * indicate a potential emergency and should be followed up as soon as possible.  Feel free to call the clinic you have any questions or concerns. The clinic phone number is [phone].  Please show the [facility] at check-in to the Emergency Department and triage nurse.

## 2017-03-16 NOTE — Progress Notes (Signed)
Pt transported to radiology via wheelchair.

## 2017-03-16 NOTE — Addendum Note (Signed)
Addended by: Ardeen Garland on: 03/16/2017 10:58 AM   Modules accepted: Orders

## 2017-03-16 NOTE — Telephone Encounter (Addendum)
Pt in for IV treatment - states new pain along right pubis bone area.  Pain is constant - 6 out of 10  Pain interferes with sleep and body positioning  Does not interfere with weight bearing.  Paula Navarro is using diluadid and ibuprophen with some control " but not gone "  Pt is resting without noted facial or verbal distress in recliner in treatment room.  Paula Navarro states in addition to above - she has had decreased appetite and intake " and I lost 4 lbs "  Last bowel movement per pt was 03/12/2017. She states she is passing gas.  Per review with midlevel -order given to obtain plain films of hip for review for further recommendations.  May proceed with treatment plan today.  Above discussed with pt and treatment room RN -who verbalized understanding.  Pt obtained hip films with no noted hip issues.  Per discussion with pt - she states she thinks her discomfort may be because she has not had a BM since Monday.  She has Miralax and Colace - she verbalized understanding of how to use per Dr Denman George ( who prescribed and discussed bowel issues since her surgery earlier this summer)  Note Paula Navarro states only other concern is nodule on her left chest which she feels is " getting bigger ".  She is scheduled for follow up with Dr Yvette Rack early September.  Paula Navarro will call RN with Dr Yvette Rack on Monday to give update on her condition.

## 2017-03-20 ENCOUNTER — Telehealth: Payer: Self-pay | Admitting: *Deleted

## 2017-03-20 NOTE — Telephone Encounter (Signed)
"  Could a Triage nurse or Dr. Geralyn Flash nurse call me, [phone]."  "Why am I not scheduled for chemotherapy  This week?  I do not need to miss any chemotherapy."  Explained chemotherapy 21-day cycle regimen.  This Friday is rest day for cycle five.  Day 1 Cycle 6 begins 03-30-2017.  Denies any further questions.

## 2017-03-21 ENCOUNTER — Other Ambulatory Visit: Payer: Self-pay | Admitting: *Deleted

## 2017-03-21 DIAGNOSIS — R1030 Lower abdominal pain, unspecified: Secondary | ICD-10-CM

## 2017-03-21 MED ORDER — DICYCLOMINE HCL 10 MG PO CAPS: 10.0000 mg | ORAL_CAPSULE | Freq: Three times a day (TID) | ORAL | 0 refills | Status: DC | PRN

## 2017-03-23 ENCOUNTER — Telehealth: Payer: Self-pay | Admitting: Medical Oncology

## 2017-03-23 NOTE — Telephone Encounter (Signed)
Will call and follow up with patient to assess further.

## 2017-03-23 NOTE — Telephone Encounter (Signed)
Persistent Right Hip pain ( posterior) -Paula Navarro said she is hurting so bad she has to take a pain pill before she can get up or  out of bed. Takes Dilaudid.

## 2017-03-29 ENCOUNTER — Telehealth: Payer: Self-pay | Admitting: *Deleted

## 2017-03-29 NOTE — Telephone Encounter (Signed)
Call returned to assess patient needs with voicemail received requesting return call. No further information received with this message. No return call from patient received today by any nurse.           "Calling to speak with Dr. Geralyn Flash nurse or Triage nurse.  My return number is [phone]."     "Having hard time with tumor pain getting worse and need help.  No other pain except occasional sharpe pain runs down my legs and goes away.      Is there another medicine to use with the dilaudid?  Use Ibuprofen occasionally to help dilaudid.  Can't use Oxycodone.  EMS had to help me when the Oxycodone messed me up.    Ran out of Dilaudid two days ago (Tues.) will need refill tomorrow which is okay because I live in [place].  I thought I'd wait until I come Friday for chemotherapy, the Lucianne Lei can also take me to the Pharmacy to fill.    Tumor looks ugly, visibly growing larger, as big as a softball now.    Can someone change the dressing to my head when I come in tomorrow?  My head has been wrapped for a week.  I'm afraid it will bleed if I try to take anything off.    Can the chemotherapy be given more often, to work better so I can have tumor removed?  I think they won't or can't remove it without leaving me disfigured.  It's in the muscle, sticking up above my left breast.  I can now feel the  tumor growing downward into my breast.   Routing call information to collaborative nurse and provider for review.  Further patient communication through collaborative nurse.  [place] [facility] transportation arranged for tomorrow's scheduled lab, flush, infusion, nutritionist beginning at 10:45 am.

## 2017-03-30 ENCOUNTER — Encounter: Payer: Self-pay | Admitting: Adult Health

## 2017-03-30 ENCOUNTER — Ambulatory Visit: Payer: Medicare Other | Admitting: Adult Health

## 2017-03-30 ENCOUNTER — Ambulatory Visit: Payer: Medicare Other | Admitting: Nutrition

## 2017-03-30 ENCOUNTER — Other Ambulatory Visit: Payer: Self-pay | Admitting: *Deleted

## 2017-03-30 ENCOUNTER — Other Ambulatory Visit: Payer: Self-pay

## 2017-03-30 ENCOUNTER — Ambulatory Visit (HOSPITAL_BASED_OUTPATIENT_CLINIC_OR_DEPARTMENT_OTHER): Payer: Medicare Other

## 2017-03-30 ENCOUNTER — Ambulatory Visit (HOSPITAL_BASED_OUTPATIENT_CLINIC_OR_DEPARTMENT_OTHER): Payer: Medicare Other | Admitting: Adult Health

## 2017-03-30 ENCOUNTER — Encounter: Payer: Medicare Other | Admitting: Nutrition

## 2017-03-30 ENCOUNTER — Other Ambulatory Visit (HOSPITAL_BASED_OUTPATIENT_CLINIC_OR_DEPARTMENT_OTHER): Payer: Medicare Other

## 2017-03-30 ENCOUNTER — Telehealth: Payer: Self-pay

## 2017-03-30 VITALS — BP 127/82 | HR 80 | Temp 98.5°F | Resp 18 | Ht 62.0 in | Wt 116.4 lb

## 2017-03-30 DIAGNOSIS — C770 Secondary and unspecified malignant neoplasm of lymph nodes of head, face and neck: Secondary | ICD-10-CM

## 2017-03-30 DIAGNOSIS — C50012 Malignant neoplasm of nipple and areola, left female breast: Secondary | ICD-10-CM

## 2017-03-30 DIAGNOSIS — G8918 Other acute postprocedural pain: Secondary | ICD-10-CM

## 2017-03-30 DIAGNOSIS — C50011 Malignant neoplasm of nipple and areola, right female breast: Secondary | ICD-10-CM

## 2017-03-30 DIAGNOSIS — C773 Secondary and unspecified malignant neoplasm of axilla and upper limb lymph nodes: Secondary | ICD-10-CM

## 2017-03-30 DIAGNOSIS — Z170 Estrogen receptor positive status [ER+]: Secondary | ICD-10-CM

## 2017-03-30 DIAGNOSIS — C7951 Secondary malignant neoplasm of bone: Secondary | ICD-10-CM | POA: Diagnosis not present

## 2017-03-30 DIAGNOSIS — C7800 Secondary malignant neoplasm of unspecified lung: Secondary | ICD-10-CM

## 2017-03-30 DIAGNOSIS — C50919 Malignant neoplasm of unspecified site of unspecified female breast: Secondary | ICD-10-CM

## 2017-03-30 DIAGNOSIS — C50911 Malignant neoplasm of unspecified site of right female breast: Secondary | ICD-10-CM

## 2017-03-30 DIAGNOSIS — C541 Malignant neoplasm of endometrium: Secondary | ICD-10-CM | POA: Diagnosis not present

## 2017-03-30 DIAGNOSIS — C50912 Malignant neoplasm of unspecified site of left female breast: Secondary | ICD-10-CM

## 2017-03-30 LAB — CBC WITH DIFFERENTIAL/PLATELET
BASO%: 0.3 % (ref 0.0–2.0)
BASOS ABS: 0 10*3/uL (ref 0.0–0.1)
EOS ABS: 0 10*3/uL (ref 0.0–0.5)
EOS%: 0.3 % (ref 0.0–7.0)
HCT: 28.9 % — ABNORMAL LOW (ref 34.8–46.6)
HEMOGLOBIN: 9.1 g/dL — AB (ref 11.6–15.9)
LYMPH%: 36.8 % (ref 14.0–49.7)
MCH: 20.9 pg — AB (ref 25.1–34.0)
MCHC: 31.5 g/dL (ref 31.5–36.0)
MCV: 66.3 fL — AB (ref 79.5–101.0)
MONO#: 0.8 10*3/uL (ref 0.1–0.9)
MONO%: 11.8 % (ref 0.0–14.0)
NEUT#: 3.5 10*3/uL (ref 1.5–6.5)
NEUT%: 50.8 % (ref 38.4–76.8)
PLATELETS: 296 10*3/uL (ref 145–400)
RBC: 4.36 10*6/uL (ref 3.70–5.45)
RDW: 20.9 % — ABNORMAL HIGH (ref 11.2–14.5)
WBC: 6.9 10*3/uL (ref 3.9–10.3)
lymph#: 2.5 10*3/uL (ref 0.9–3.3)
nRBC: 0 % (ref 0–0)

## 2017-03-30 LAB — COMPREHENSIVE METABOLIC PANEL
ALBUMIN: 2.9 g/dL — AB (ref 3.5–5.0)
ALK PHOS: 58 U/L (ref 40–150)
ALT: 18 U/L (ref 0–55)
ANION GAP: 7 meq/L (ref 3–11)
AST: 36 U/L — ABNORMAL HIGH (ref 5–34)
BILIRUBIN TOTAL: 0.43 mg/dL (ref 0.20–1.20)
BUN: 15.4 mg/dL (ref 7.0–26.0)
CALCIUM: 9.4 mg/dL (ref 8.4–10.4)
CO2: 26 mEq/L (ref 22–29)
Chloride: 103 mEq/L (ref 98–109)
Creatinine: 0.8 mg/dL (ref 0.6–1.1)
Glucose: 95 mg/dl (ref 70–140)
POTASSIUM: 3.8 meq/L (ref 3.5–5.1)
SODIUM: 136 meq/L (ref 136–145)
Total Protein: 7.1 g/dL (ref 6.4–8.3)

## 2017-03-30 MED ORDER — SODIUM CHLORIDE 0.9% FLUSH
10.0000 mL | INTRAVENOUS | Status: DC | PRN
  Administered 2017-03-30: 10 mL
  Filled 2017-03-30: qty 10

## 2017-03-30 MED ORDER — PROCHLORPERAZINE MALEATE 10 MG PO TABS
10.0000 mg | ORAL_TABLET | Freq: Once | ORAL | Status: AC
  Administered 2017-03-30: 10 mg via ORAL

## 2017-03-30 MED ORDER — SODIUM CHLORIDE 0.9 % IV SOLN: Freq: Once | INTRAVENOUS | Status: DC

## 2017-03-30 MED ORDER — HEPARIN SOD (PORK) LOCK FLUSH 100 UNIT/ML IV SOLN
500.0000 [IU] | Freq: Once | INTRAVENOUS | Status: AC | PRN
  Administered 2017-03-30: 500 [IU]
  Filled 2017-03-30: qty 5

## 2017-03-30 MED ORDER — SODIUM CHLORIDE 0.9% FLUSH
10.0000 mL | INTRAVENOUS | Status: DC | PRN
  Administered 2017-03-30: 10 mL via INTRAVENOUS
  Filled 2017-03-30: qty 10

## 2017-03-30 MED ORDER — DILAUDID 2 MG PO TABS: 2.0000 mg | ORAL_TABLET | ORAL | 0 refills | Status: DC | PRN

## 2017-03-30 MED ORDER — PROCHLORPERAZINE MALEATE 10 MG PO TABS
ORAL_TABLET | ORAL | Status: AC
  Filled 2017-03-30: qty 1

## 2017-03-30 MED ORDER — SODIUM CHLORIDE 0.9 % IV SOLN
2.0000 mg | Freq: Once | INTRAVENOUS | Status: AC
  Administered 2017-03-30: 2 mg via INTRAVENOUS
  Filled 2017-03-30: qty 4

## 2017-03-30 MED ORDER — HYDROMORPHONE HCL 2 MG/ML IJ SOLN
2.0000 mg | Freq: Once | INTRAMUSCULAR | Status: AC
  Administered 2017-03-30: 2 mg via INTRAVENOUS

## 2017-03-30 MED ORDER — PROCHLORPERAZINE MALEATE 10 MG PO TABS: 10.0000 mg | ORAL_TABLET | Freq: Four times a day (QID) | ORAL | 1 refills | Status: DC | PRN

## 2017-03-30 MED ORDER — HYDROMORPHONE HCL 2 MG/ML IJ SOLN
INTRAMUSCULAR | Status: AC
  Filled 2017-03-30: qty 1

## 2017-03-30 NOTE — Pre-Procedure Instructions (Signed)
Left breast [id number]

## 2017-03-30 NOTE — Pre-Procedure Instructions (Signed)
Scalp lesion [id number]

## 2017-03-30 NOTE — Patient Instructions (Signed)

## 2017-03-30 NOTE — Progress Notes (Signed)
[place] [facility] Follow up:    [name], MD (Inactive) No address on file   DIAGNOSIS: Cancer Staging Bilateral breast cancer [facility]) Staging form: Breast, AJCC 7th Edition - Clinical: Stage IIB (T2, N1, cM0) - Unsigned Staging comments: Staged at breast conference 08/13/13  - Pathologic: No stage assigned - Unsigned  Endometrial cancer [facility]) Staging form: Corpus Uteri - Carcinoma and Carcinosarcoma, AJCC 8th Edition - Pathologic stage from 02/13/2017: FIGO Stage IIIA, calculated as Stage Unknown (pT3a, pNX, cM0) - Signed by Everitt Amber, MD on 03/14/2017 - Clinical: No stage assigned - Unsigned   SUMMARY OF ONCOLOGIC HISTORY:   Bilateral breast cancer ([place])   07/23/2013 Mammogram    Bilateral breast masses. 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 N1 M0 stage IIB ER 100%, PR 0%, HER-2       06/17/2014 -  Radiation Therapy    Adjuvant radiation therapy      09/14/2014 -  Anti-estrogen oral therapy   Anastrozole 1 mg daily      05/28/2015 Imaging    CT scans: Enlarging subpectoral masses 3.1 x 3.5 cm, posteriorly lower density mass 4.5 x 2.1 cm, several right-sided lung nodules right lower lobe 1.4 cm, 3 other right lung nodules, 1.7 cm right iliac bone lesion      06/21/2015 Procedure    Left subpectoral mass biopsy: Invasive high-grade ductal carcinoma ER 5%, PR 0%, HER-2 negative ratio 1.29      09/01/2015 Imaging    Left chest wall mass increased in size 7.2 x 5.1 cm, multiple subcutaneous nodules, increase in the lung nodules both in number as well as in the size of existing nodules      09/10/2015 -  Chemotherapy    Carboplatin, gemcitabine days 1 and 8 q 3 weeks, carboplatin discontinued for neuropathy (treatment break from 01/20/2016 to 03/24/2016); Added Carboplatin back 04/14/16 (for progression)      01/26/2016 Imaging    Marked improvement in size of lung nodules with many of the nodules resolved index right lower lobe nodule 1.9 x 1.8 cm is now 0.7 x 0.6 cm, reduction in the size of left breast mass 7.2 cm down to 2.6 cm, enlargement in the hypodense mass in the uterus      04/06/2016 Imaging    CT chest: Interval progression of pre-existing lung nodules new lung metastases (59m, 154m 2.9 cm, 1.6  cm), interval progression of disease in the left breast 4.2 cm (was 2.6 cm), additional nodules 2.3 cm and 3.2 cm; CT head: scalp mass 1.9 cm      06/27/2016 Imaging    Ct chest: Stable lesions in lt breast deep to Left pectoralis, cystic lesion loc ant in left breast inc compared to previous; decrease in lung nodules (65m to 3 mm; 12 mm to 7 mm, RML nodule 2.9 cm to 1.8 cm, RLL 12mto 7 mm)      11/14/2016 Imaging    CT CAP:RP and retrocrural lymphadenopathy, large uterine fibroids with necrosis; MRI Abd: RP lymphadenopathy CT chest 11/20/16: Worsening bulky conglomeration of mediastinal and right hilar lymph nodes, lung nodules subcentimeter size slight increase, left breast mass  was 3.7 x 5.8 cm now 5.4 x 7.4 cm      12/08/2016 -  Chemotherapy    Palliative chemotherapy with Halaven days 1 and 8 every 3 weeks        Endometrial cancer (HCPeever  02/13/2017 Surgery    Hysterectomy with BSO: HG Serous carcinoma 7.7 cm (>50% Myometrial inv)involving Uterus and Bilateral ovaries, Omentum: No carcinoma; T3a (FIGO stage IIIa)       CURRENT THERAPY: Eribulin   INTERVAL HISTORY: Paula CHANNING89.o. female returns for evaluation prior to Eribulin.  She is doing moderately well.  She says that she feels her cancer is growing in her breast.  She says she feels like it is going to "Pop" and the skin is turning red around it.  She says the lesion on her scalp is growing as well.  Her left breast lesion is more painful than the scalp lesion, and she is requesting a refill on her Dilaudid today.  She denies any fever, chills, nausea, vomiting, and says her neuropathy is improved from prior.     Patient Active Problem List   Diagnosis Date Noted  . Endometrial cancer (HCSedona05/21/2018  . Protein-calorie malnutrition, severe 11/15/2016  . UTI (urinary tract infection) 11/14/2016  . COPD (chronic obstructive pulmonary disease) (HCHub[place]  . Metastasis to supraclavicular lymph node (HCLaguna Heights11/19/2017  . Chemotherapy-induced thrombocytopenia 06/13/2016  . Scalp lesion 04/01/2016  . Encounter for central line care 12/08/2015  . Port catheter in place 11/19/2015  . Insomnia 11/01/2015  . Microcytic anemia 09/28/2015  . Lung metastases (HCHemp[place]02/04/2016  . Bone metastases (HCSiesta Acres12/20/2016  . Goals of care, counseling/discussion 07/13/2015  . Metastatic breast cancer (HCTravis11/29/2016  . Chronic pain 03/16/2015  . Vaginal bleeding 09/24/2014  . Postherpetic neuralgia 09/03/2014  . Lymphedema 09/03/2014  . Suspected herpes zoster left C5 distribution 08/14/2014  . Neuropathy due to chemotherapeutic drug (HCLake [place]08/10/2013  . Anxiety 03/03/2014  . Bilateral breast cancer (HCExmore 08/11/2013  . Palpitation   . Chest pain   . Atrial septal defect   . Laboratory test 11/25/2011  . Chronic bronchitis   . Tobacco abuse     is allergic to codeine and morphine and related.  MEDICAL HISTORY: Past Medical History:  Diagnosis Date  . Anemia   . Anxiety   . Atrial septal defect 1996   Surgical repair in 1996  . Breast cancer (HCEagle Nestdx'd 06/2013  . Chest pain    Admitted to [facility] in 09/2011; refused stress test  . Chronic bronchitis   . Chronic pain   . COPD (chronic obstructive pulmonary disease) (HCHohen[place]   on xray  . Metastatic cancer to lung (HCAnn Arbordx'd 2017  .  Palpitation    Tachycardia reported by monitor clerk during a symptomatic spell  . Radiation 06/30/14-08/17/14   Bilateral Breast  . Tobacco abuse    60 pack years; 1.5 packs per day  . Wears dentures    top    SURGICAL HISTORY: Past Surgical History:  Procedure Laterality Date  . ASD REPAIR, OSTIUM PRIMUM  1996   dr Roxy Horseman  . AXILLARY LYMPH NODE DISSECTION Bilateral 04/08/2014   Procedure:  BILATERAL AXILLARY LYMPH NODE DISSECTION;  Surgeon: Autumn Messing III, MD;  Location: [place];  Service: General;  Laterality: Bilateral;  . BREAST BIOPSY Bilateral   . BREAST LUMPECTOMY WITH RADIOACTIVE SEED LOCALIZATION Bilateral 04/08/2014   Procedure: BILATERAL  RADIOACTIVE SEED LOCALIZATION LUMPECTOMY ;  Surgeon: Autumn Messing III, MD;  Location: [place];  Service: General;  Laterality: Bilateral;  . CESAREAN SECTION     x3  . CHOLECYSTECTOMY    . LAPAROTOMY N/A 02/13/2017   Procedure: MINI EXPLORATORY LAPAROTOMY;  Surgeon: Everitt Amber, MD;  Location: [facility];  Service: Gynecology;  Laterality: N/A;  . open heart surgery    . PORT A CATH REVISION  1/15   put in   . ROBOTIC ASSISTED TOTAL HYSTERECTOMY WITH BILATERAL SALPINGO OOPHERECTOMY Bilateral 02/13/2017   Procedure: XI ROBOTIC ASSISTED TOTAL HYSTERECTOMY WITH BILATERAL SALPINGO OOPHORECTOMY WITH LYSIS OF ADHESIONS, UTERUS GREATER  THAN 250GRAMS;  Surgeon: Everitt Amber, MD;  Location: [facility];  Service: Gynecology;  Laterality: Bilateral;  . TUBAL LIGATION      SOCIAL HISTORY: Social History   Social History  . Marital status: Widowed    Spouse name: N/A  . Number of children: N/A  . Years of education: N/A   Occupational History  . Not on file.   Social History Main Topics  . Smoking status: Former Smoker    Packs/day: 1.50    Years: 40.00    Types: Cigarettes    Quit date: 08/01/2012  . Smokeless tobacco: Never Used  . Alcohol use No  . Drug use: No  . Sexual activity: Not Currently   Other Topics Concern  . Not on file   Social History Narrative  . No narrative on file    FAMILY HISTORY: Family History  Problem Relation Age of Onset  . Breast cancer Maternal Aunt     Review of Systems  Constitutional: Negative for appetite change, chills, fatigue, fever and unexpected weight change.  HENT:   Negative for hearing loss and lump/mass.   Eyes: Negative for eye problems and icterus.  Respiratory: Negative for chest tightness, cough and shortness of breath.   Cardiovascular: Negative for chest pain, leg swelling and palpitations.  Gastrointestinal: Negative for abdominal distention, abdominal pain, constipation and vomiting.  Endocrine: Negative for hot flashes.  Neurological: Negative for dizziness and headaches.  Hematological: Negative for adenopathy. Does not bruise/bleed easily.  Psychiatric/Behavioral: Negative for depression. The patient is not nervous/anxious.       PHYSICAL EXAMINATION  ECOG PERFORMANCE STATUS: 2 - Symptomatic, <50% confined to bed  Vitals:   03/30/17 1048  BP: 127/82  Pulse: 80  Resp: 18  Temp: 98.5 F (36.9 C)  SpO2: 100%    Physical Exam  Constitutional: She is oriented to person, place, and time and well-developed, well-nourished, and in no distress.  HENT:  Head: Normocephalic and atraumatic.  Mouth/Throat: Oropharynx is clear and moist. No  oropharyngeal exudate.  Eyes: Pupils are equal, round, and reactive to light. No scleral icterus.  Neck:  Neck supple.  Cardiovascular: Normal rate, regular rhythm and normal heart sounds.   Pulmonary/Chest: Effort normal and breath sounds normal.  Abdominal: Soft. Bowel sounds are normal.  Lymphadenopathy:    She has no cervical adenopathy.  Neurological: She is alert and oriented to person, place, and time.  Skin:  See pictures below  Psychiatric: Mood and affect normal.    left breast/axillary tumor on 01/26/2017 below                                                          Left breast/axillary tumor on 03/30/2017 below   Scalp lesion on 01/26/2017 below                                                        Scalp lesion on 03/30/2017 below  LABORATORY DATA:  CBC    Component Value Date/Time   WBC 6.9 03/30/2017 1034   WBC 12.9 (H) 02/14/2017 0612   RBC 4.36 03/30/2017 1034   RBC 4.15 02/14/2017 0612   HGB 9.1 (L) 03/30/2017 1034   HCT 28.9 (L) 03/30/2017 1034   PLT 296 03/30/2017 1034   MCV 66.3 (L) 03/30/2017 1034   MCH 20.9 (L) 03/30/2017 1034   MCH 20.7 (L) 02/14/2017 0612   MCHC 31.5 03/30/2017 1034   MCHC 32.0 02/14/2017 0612   RDW 20.9 (H) 03/30/2017 1034   LYMPHSABS 2.5 03/30/2017 1034   MONOABS 0.8 03/30/2017 1034   EOSABS 0.0 03/30/2017 1034   BASOSABS 0.0 03/30/2017 1034    CMP     Component Value Date/Time   NA 136 03/30/2017 1034   K 3.8 03/30/2017 1034   CL 100 (L) 02/14/2017 0612   CO2 26 03/30/2017 1034   GLUCOSE 95 03/30/2017 1034   BUN 15.4 03/30/2017 1034   CREATININE 0.8 03/30/2017 1034   CALCIUM 9.4 03/30/2017 1034   PROT 7.1 03/30/2017 1034   ALBUMIN 2.9 (L) 03/30/2017 1034   AST 36 (H) 03/30/2017 1034   ALT 18 03/30/2017 1034   ALKPHOS 58 03/30/2017 1034   BILITOT 0.43 03/30/2017 1034   GFRNONAA >60 02/14/2017 0612   GFRAA >60 02/14/2017 0612        ASSESSMENT and PLAN:   Bilateral breast cancer ([place]) Unfortunately, it  appears that Teniya's breast cancer is growing since her last appt with Dr. Lindi Adie.  She and I reviewed that her tumor may even erupt through the skin in her breast.  We put a dressing over that area.  I reviewed her case with Dr. Lindi Adie, and she will continue Eribulin for the time being and we will refer to Dr. Lisbeth Renshaw to see if he can radiate any of the areas.  She will return and see me in one week.  She knows to call if she needs anything whatsoever.  She did verbalize understanding of everything.    All questions were answered. The patient knows to call the clinic with any problems, questions or concerns. We can certainly see the patient much sooner if necessary.  A total of (30) minutes of face-to-face time was spent with this patient with greater than 50% of that  time in counseling and care-coordination.  This note was electronically signed. Scot Dock, NP 03/30/2017

## 2017-03-30 NOTE — Telephone Encounter (Signed)
Called and LVM for pt to notify her that she will be seeing Lindsey,NP this morning prior to chemo treatment. Her port will be accessed and labs drawn in MD office.

## 2017-03-30 NOTE — Progress Notes (Signed)
Nutrition follow up completed with patient during chemotherapy for met breast cancer. Weight improved and documented as 116 pounds, up from 110# on August 17. Patient reports she is trying to eat. Likes Boost but cannot afford it.  Nutrition Diagnosis: Severe malnutrition continues.  Intervention: Educated patient to increase calories and snacks and increase oral nutrition supplements TID between meals. Provided one complimentary case of Ensure Plus. Teach back method used.  Monitoring, Evaluation, Goals: Patient will increase calories and protein to minimize wt loss.  Next Visit: To be scheduled as needed.

## 2017-03-30 NOTE — Patient Instructions (Signed)
[facility] Discharge Instructions for Patients Receiving Chemotherapy  Today you received the following chemotherapy agents halaven  To help prevent nausea and vomiting after your treatment, we encourage you to take your nausea medication as prescribed.  If you develop nausea and vomiting that is not controlled by your nausea medication, call the clinic.   BELOW ARE SYMPTOMS THAT SHOULD BE REPORTED IMMEDIATELY:  *FEVER GREATER THAN 100.5 F  *CHILLS WITH OR WITHOUT FEVER  NAUSEA AND VOMITING THAT IS NOT CONTROLLED WITH YOUR NAUSEA MEDICATION  *UNUSUAL SHORTNESS OF BREATH  *UNUSUAL BRUISING OR BLEEDING  TENDERNESS IN MOUTH AND THROAT WITH OR WITHOUT PRESENCE OF ULCERS  *URINARY PROBLEMS  *BOWEL PROBLEMS  UNUSUAL RASH Items with * indicate a potential emergency and should be followed up as soon as possible.  Feel free to call the clinic you have any questions or concerns. The clinic phone number is [phone].  Please show the [facility] at check-in to the Emergency Department and triage nurse.

## 2017-03-30 NOTE — Assessment & Plan Note (Signed)
Unfortunately, it appears that Paula Navarro's breast cancer is growing since her last appt with Dr. Lindi Adie.  She and I reviewed that her tumor may even erupt through the skin in her breast.  We put a dressing over that area.  I reviewed her case with Dr. Lindi Adie, and she will continue Eribulin for the time being and we will refer to Dr. Lisbeth Renshaw to see if he can radiate any of the areas.  She will return and see me in one week.  She knows to call if she needs anything whatsoever.  She did verbalize understanding of everything.

## 2017-04-02 ENCOUNTER — Other Ambulatory Visit: Payer: Self-pay | Admitting: Hematology and Oncology

## 2017-04-02 DIAGNOSIS — C7951 Secondary malignant neoplasm of bone: Secondary | ICD-10-CM

## 2017-04-04 ENCOUNTER — Encounter: Payer: Self-pay | Admitting: Radiation Oncology

## 2017-04-04 ENCOUNTER — Telehealth: Payer: Self-pay

## 2017-04-04 NOTE — Telephone Encounter (Signed)
Pt called to give her new home phone. Chart updated

## 2017-04-06 ENCOUNTER — Other Ambulatory Visit (HOSPITAL_BASED_OUTPATIENT_CLINIC_OR_DEPARTMENT_OTHER): Payer: Medicare Other

## 2017-04-06 ENCOUNTER — Ambulatory Visit (HOSPITAL_BASED_OUTPATIENT_CLINIC_OR_DEPARTMENT_OTHER): Payer: Medicare Other

## 2017-04-06 ENCOUNTER — Encounter: Payer: Self-pay | Admitting: Adult Health

## 2017-04-06 ENCOUNTER — Ambulatory Visit: Payer: Medicare Other

## 2017-04-06 ENCOUNTER — Ambulatory Visit (HOSPITAL_BASED_OUTPATIENT_CLINIC_OR_DEPARTMENT_OTHER): Payer: Medicare Other | Admitting: Adult Health

## 2017-04-06 VITALS — BP 140/88 | HR 75 | Temp 98.1°F | Resp 18 | Ht 62.0 in | Wt 113.6 lb

## 2017-04-06 DIAGNOSIS — C7800 Secondary malignant neoplasm of unspecified lung: Secondary | ICD-10-CM

## 2017-04-06 DIAGNOSIS — C770 Secondary and unspecified malignant neoplasm of lymph nodes of head, face and neck: Secondary | ICD-10-CM

## 2017-04-06 DIAGNOSIS — C7951 Secondary malignant neoplasm of bone: Secondary | ICD-10-CM

## 2017-04-06 DIAGNOSIS — C50012 Malignant neoplasm of nipple and areola, left female breast: Secondary | ICD-10-CM

## 2017-04-06 DIAGNOSIS — K5903 Drug induced constipation: Secondary | ICD-10-CM

## 2017-04-06 DIAGNOSIS — C50919 Malignant neoplasm of unspecified site of unspecified female breast: Secondary | ICD-10-CM

## 2017-04-06 DIAGNOSIS — C50011 Malignant neoplasm of nipple and areola, right female breast: Secondary | ICD-10-CM

## 2017-04-06 DIAGNOSIS — C50911 Malignant neoplasm of unspecified site of right female breast: Secondary | ICD-10-CM

## 2017-04-06 DIAGNOSIS — Z170 Estrogen receptor positive status [ER+]: Secondary | ICD-10-CM

## 2017-04-06 DIAGNOSIS — G893 Neoplasm related pain (acute) (chronic): Secondary | ICD-10-CM | POA: Diagnosis not present

## 2017-04-06 DIAGNOSIS — C50912 Malignant neoplasm of unspecified site of left female breast: Secondary | ICD-10-CM

## 2017-04-06 LAB — CBC WITH DIFFERENTIAL/PLATELET
BASO%: 4.6 % — ABNORMAL HIGH (ref 0.0–2.0)
BASOS ABS: 0.3 10*3/uL — AB (ref 0.0–0.1)
EOS%: 0.3 % (ref 0.0–7.0)
Eosinophils Absolute: 0 10*3/uL (ref 0.0–0.5)
HEMATOCRIT: 29.4 % — AB (ref 34.8–46.6)
HEMOGLOBIN: 9.1 g/dL — AB (ref 11.6–15.9)
LYMPH#: 3 10*3/uL (ref 0.9–3.3)
LYMPH%: 46.1 % (ref 14.0–49.7)
MCH: 20.5 pg — AB (ref 25.1–34.0)
MCHC: 31 g/dL — AB (ref 31.5–36.0)
MCV: 66.2 fL — AB (ref 79.5–101.0)
MONO#: 0.4 10*3/uL (ref 0.1–0.9)
MONO%: 6.3 % (ref 0.0–14.0)
NEUT#: 2.8 10*3/uL (ref 1.5–6.5)
NEUT%: 42.7 % (ref 38.4–76.8)
Platelets: 252 10*3/uL (ref 145–400)
RBC: 4.44 10*6/uL (ref 3.70–5.45)
RDW: 20.6 % — ABNORMAL HIGH (ref 11.2–14.5)
WBC: 6.5 10*3/uL (ref 3.9–10.3)
nRBC: 0 % (ref 0–0)

## 2017-04-06 LAB — COMPREHENSIVE METABOLIC PANEL
ALBUMIN: 3 g/dL — AB (ref 3.5–5.0)
ALT: 24 U/L (ref 0–55)
AST: 46 U/L — ABNORMAL HIGH (ref 5–34)
Alkaline Phosphatase: 61 U/L (ref 40–150)
Anion Gap: 8 mEq/L (ref 3–11)
BUN: 11.8 mg/dL (ref 7.0–26.0)
CALCIUM: 10 mg/dL (ref 8.4–10.4)
CHLORIDE: 101 meq/L (ref 98–109)
CO2: 27 meq/L (ref 22–29)
CREATININE: 0.7 mg/dL (ref 0.6–1.1)
GLUCOSE: 115 mg/dL (ref 70–140)
Potassium: 3.5 mEq/L (ref 3.5–5.1)
Sodium: 136 mEq/L (ref 136–145)
Total Bilirubin: 0.61 mg/dL (ref 0.20–1.20)
Total Protein: 7.3 g/dL (ref 6.4–8.3)

## 2017-04-06 LAB — TECHNOLOGIST REVIEW

## 2017-04-06 MED ORDER — ZOLPIDEM TARTRATE 10 MG PO TABS: 10.0000 mg | ORAL_TABLET | Freq: Every day | ORAL | 2 refills | Status: DC

## 2017-04-06 MED ORDER — POLYETHYLENE GLYCOL 3350 17 G PO PACK: 17.0000 g | PACK | Freq: Two times a day (BID) | ORAL | 0 refills | Status: DC

## 2017-04-06 MED ORDER — SODIUM CHLORIDE 0.9 % IV SOLN: Freq: Once | INTRAVENOUS | Status: DC

## 2017-04-06 MED ORDER — PROCHLORPERAZINE MALEATE 10 MG PO TABS
ORAL_TABLET | ORAL | Status: AC
  Filled 2017-04-06: qty 1

## 2017-04-06 MED ORDER — DOCUSATE SODIUM 100 MG PO CAPS: 100.0000 mg | ORAL_CAPSULE | Freq: Two times a day (BID) | ORAL | 0 refills | Status: AC

## 2017-04-06 MED ORDER — PROCHLORPERAZINE MALEATE 10 MG PO TABS
10.0000 mg | ORAL_TABLET | Freq: Once | ORAL | Status: AC
  Administered 2017-04-06: 10 mg via ORAL

## 2017-04-06 MED ORDER — ERIBULIN MESYLATE CHEMO INJECTION 1 MG/2ML
1.3000 mg/m2 | Freq: Once | INTRAVENOUS | Status: AC
  Administered 2017-04-06: 2 mg via INTRAVENOUS
  Filled 2017-04-06: qty 4

## 2017-04-06 MED ORDER — HYDROMORPHONE HCL 2 MG/ML IJ SOLN
INTRAMUSCULAR | Status: AC
  Filled 2017-04-06: qty 1

## 2017-04-06 MED ORDER — SODIUM CHLORIDE 0.9% FLUSH
10.0000 mL | INTRAVENOUS | Status: DC | PRN
  Administered 2017-04-06: 10 mL via INTRAVENOUS
  Filled 2017-04-06: qty 10

## 2017-04-06 MED ORDER — HYDROMORPHONE HCL 4 MG/ML IJ SOLN
2.0000 mg | Freq: Once | INTRAMUSCULAR | Status: DC
Start: 2017-04-06 — End: 2017-04-06
  Filled 2017-04-06: qty 1

## 2017-04-06 MED ORDER — HYDROMORPHONE HCL 2 MG/ML IJ SOLN
2.0000 mg | Freq: Once | INTRAMUSCULAR | Status: AC
Start: 2017-04-06 — End: 2017-04-06
  Administered 2017-04-06: 2 mg via INTRAVENOUS

## 2017-04-06 MED ORDER — FENTANYL 25 MCG/HR TD PT72: 25.0000 ug | MEDICATED_PATCH | TRANSDERMAL | 0 refills | Status: DC

## 2017-04-06 MED ORDER — SODIUM CHLORIDE 0.9% FLUSH
10.0000 mL | INTRAVENOUS | Status: DC | PRN
  Administered 2017-04-06: 10 mL
  Filled 2017-04-06: qty 10

## 2017-04-06 MED ORDER — HEPARIN SOD (PORK) LOCK FLUSH 100 UNIT/ML IV SOLN
500.0000 [IU] | Freq: Once | INTRAVENOUS | Status: AC | PRN
  Administered 2017-04-06: 500 [IU]
  Filled 2017-04-06: qty 5

## 2017-04-06 NOTE — Progress Notes (Signed)
[place] [facility] Follow up:    Paula Donna, MD (Inactive) No address on file   DIAGNOSIS: Cancer Staging Bilateral breast cancer [facility]) Staging form: Breast, AJCC 7th Edition - Clinical: Stage IIB (T2, N1, cM0) - Unsigned Staging comments: Staged at breast conference 08/13/13  - Pathologic: No stage assigned - Unsigned  Endometrial cancer [facility]) Staging form: Corpus Uteri - Carcinoma and Carcinosarcoma, AJCC 8th Edition - Pathologic stage from 02/13/2017: FIGO Stage IIIA, calculated as Stage Unknown (pT3a, pNX, cM0) - Signed by Everitt Amber, MD on 03/14/2017 - Clinical: No stage assigned - Unsigned   SUMMARY OF ONCOLOGIC HISTORY:   Bilateral breast cancer ([place])   07/23/2013 Mammogram    Bilateral breast masses. 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 N1 M0 stage IIB ER 100%, PR 0%, HER-2       06/17/2014 -  Radiation Therapy    Adjuvant radiation therapy      09/14/2014 -  Anti-estrogen oral therapy   Anastrozole 1 mg daily      05/28/2015 Imaging    CT scans: Enlarging subpectoral masses 3.1 x 3.5 cm, posteriorly lower density mass 4.5 x 2.1 cm, several right-sided lung nodules right lower lobe 1.4 cm, 3 other right lung nodules, 1.7 cm right iliac bone lesion      06/21/2015 Procedure    Left subpectoral mass biopsy: Invasive high-grade ductal carcinoma ER 5%, PR 0%, HER-2 negative ratio 1.29      09/01/2015 Imaging    Left chest wall mass increased in size 7.2 x 5.1 cm, multiple subcutaneous nodules, increase in the lung nodules both in number as well as in the size of existing nodules      09/10/2015 -  Chemotherapy    Carboplatin, gemcitabine days 1 and 8 q 3 weeks, carboplatin discontinued for neuropathy (treatment break from 01/20/2016 to 03/24/2016); Added Carboplatin back 04/14/16 (for progression)      01/26/2016 Imaging    Marked improvement in size of lung nodules with many of the nodules resolved index right lower lobe nodule 1.9 x 1.8 cm is now 0.7 x 0.6 cm, reduction in the size of left breast mass 7.2 cm down to 2.6 cm, enlargement in the hypodense mass in the uterus      04/06/2016 Imaging    CT chest: Interval progression of pre-existing lung nodules new lung metastases (59m, 154m 2.9 cm, 1.6  cm), interval progression of disease in the left breast 4.2 cm (was 2.6 cm), additional nodules 2.3 cm and 3.2 cm; CT head: scalp mass 1.9 cm      06/27/2016 Imaging    Ct chest: Stable lesions in lt breast deep to Left pectoralis, cystic lesion loc ant in left breast inc compared to previous; decrease in lung nodules (75m to 3 mm; 12 mm to 7 mm, RML nodule 2.9 cm to 1.8 cm, RLL 132mto 7 mm)      11/14/2016 Imaging    CT CAP:RP and retrocrural lymphadenopathy, large uterine fibroids with necrosis; MRI Abd: RP lymphadenopathy CT chest 11/20/16: Worsening bulky conglomeration of mediastinal and right hilar lymph nodes, lung nodules subcentimeter size slight increase, left breast mass  was 3.7 x 5.8 cm now 5.4 x 7.4 cm      12/08/2016 -  Chemotherapy    Palliative chemotherapy with [place] days 1 and 8 every 3 weeks        Endometrial cancer (HC[place]  02/13/2017 Surgery    Hysterectomy with BSO: HG Serous carcinoma 7.7 cm (>50% Myometrial inv)involving Uterus and Bilateral ovaries, Omentum: No carcinoma; T3a (FIGO stage IIIa)       CURRENT THERAPY: Eribulin  INTERVAL HISTORY: Paula GLADMAN89.o. female returns for evaluation prior to receiving cycle 6 day 8 of Eribulin.  She is doing moderately well.  Her main issue today is pain.  Her left breast tumor is larger.  She says her pain is 7-8 and is constant.  She says the dilaudid isn't helping.  She is taking the dilaudid around the clock.  She is due for Eribulin today.  She says her neuropathy is stable and unchanged.  She says that she does have some mild constipation as well.    Patient Active Problem List   Diagnosis Date Noted  . Endometrial cancer (HCCl[place] City05/21/2018  . Protein-calorie malnutrition, severe 11/15/2016  . UTI (urinary tract infection) 11/14/2016  . COPD (chronic obstructive pulmonary disease) (HCMontrose  . Metastasis to supraclavicular lymph node (HCChase[place]11/19/2017  . Chemotherapy-induced thrombocytopenia 06/13/2016  . Scalp lesion 04/01/2016  . Encounter for central line care 12/08/2015  . Port catheter in place 11/19/2015  . Insomnia 11/01/2015  . Microcytic anemia 09/28/2015  . Lung metastases (HCConway02/04/2016  . Bone metastases (HCGreer12/20/2016  . Goals of care, counseling/discussion 07/13/2015  . Metastatic breast cancer (HCSpring City11/29/2016  . Chronic pain 03/16/2015  . Vaginal bleeding 09/24/2014  . Postherpetic neuralgia 09/03/2014  . Lymphedema 09/03/2014  . Suspected herpes zoster left C5 distribution 08/14/2014  . Neuropathy due to chemotherapeutic drug (HCKettlersville08/10/2013  . Anxiety 03/03/2014  . Bilateral breast cancer (HCSc[place]01/06/2014  . Palpitation   . Chest pain   . Atrial septal  defect   . Laboratory test 11/25/2011  . Chronic bronchitis   . Tobacco abuse     is allergic to codeine and morphine and related.  MEDICAL HISTORY: Past Medical History:  Diagnosis Date  . Anemia   . Anxiety   . Atrial septal defect 1996   Surgical repair in 1996  . Breast cancer (HCFort Carsondx'd 06/2013  . Chest pain    Admitted to [facility] in 09/2011; refused stress test  . Chronic bronchitis   . Chronic pain   . COPD (chronic obstructive pulmonary disease) (HCGenoa City   on xray  . Metastatic cancer to lung (HCLake Wildernessdx'd 2017  . Palpitation    Tachycardia reported by monitor clerk  during a symptomatic spell  . Radiation 06/30/14-08/17/14   Bilateral Breast  . Tobacco abuse    60 pack years; 1.5 packs per day  . Wears dentures    top    SURGICAL HISTORY: Past Surgical History:  Procedure Laterality Date  . ASD REPAIR, OSTIUM PRIMUM  1996   dr Roxy Horseman  . AXILLARY LYMPH NODE DISSECTION Bilateral 04/08/2014   Procedure:  BILATERAL AXILLARY LYMPH NODE DISSECTION;  Surgeon: Autumn Messing III, MD;  Location: [facility];  Service: General;  Laterality: Bilateral;  . BREAST BIOPSY Bilateral   . BREAST LUMPECTOMY WITH RADIOACTIVE SEED LOCALIZATION Bilateral 04/08/2014   Procedure: BILATERAL  RADIOACTIVE SEED LOCALIZATION LUMPECTOMY ;  Surgeon: Autumn Messing III, MD;  Location: [facility];  Service: General;  Laterality: Bilateral;  . CESAREAN SECTION     x3  . CHOLECYSTECTOMY    . LAPAROTOMY N/A 02/13/2017   Procedure: MINI EXPLORATORY LAPAROTOMY;  Surgeon: Everitt Amber, MD;  Location: [facility];  Service: Gynecology;  Laterality: N/A;  . open heart surgery    . PORT A CATH REVISION  1/15   put in   . ROBOTIC ASSISTED TOTAL HYSTERECTOMY WITH BILATERAL SALPINGO OOPHERECTOMY Bilateral 02/13/2017   Procedure: XI ROBOTIC ASSISTED TOTAL HYSTERECTOMY WITH BILATERAL SALPINGO OOPHORECTOMY WITH LYSIS OF ADHESIONS, UTERUS GREATER THAN 250GRAMS;  Surgeon: Everitt Amber, MD;  Location: [facility];   Service: Gynecology;  Laterality: Bilateral;  . TUBAL LIGATION      SOCIAL HISTORY: Social History   Social History  . Marital status: Widowed    Spouse name: N/A  . Number of children: N/A  . Years of education: N/A   Occupational History  . Not on file.   Social History Main Topics  . Smoking status: Former Smoker    Packs/day: 1.50    Years: 40.00    Types: Cigarettes    Quit date: 08/01/2012  . Smokeless tobacco: Never Used  . Alcohol use No  . Drug use: No  . Sexual activity: Not Currently   Other Topics Concern  . Not on file   Social History Narrative  . No narrative on file    FAMILY HISTORY: Family History  Problem Relation Age of Onset  . Breast cancer Maternal Aunt     Review of Systems  Constitutional: Negative for appetite change, chills, fatigue and fever.  HENT:   Negative for hearing loss and lump/mass.   Eyes: Negative for eye problems and icterus.  Respiratory: Negative for chest tightness, cough and shortness of breath.   Cardiovascular: Negative for chest pain, leg swelling and palpitations.  Gastrointestinal: Negative for abdominal distention, abdominal pain, constipation, diarrhea, nausea and vomiting.  Endocrine: Negative for hot flashes.  Genitourinary: Negative for difficulty urinating.   Musculoskeletal: Negative for arthralgias.  Skin: Negative for itching and rash.  Neurological: Negative for dizziness, extremity weakness, headaches and numbness.  Hematological: Negative for adenopathy. Does not bruise/bleed easily.  Psychiatric/Behavioral: Negative for depression. The patient is not nervous/anxious.       PHYSICAL EXAMINATION  ECOG PERFORMANCE STATUS: 2 - Symptomatic, <50% confined to bed  Vitals:   04/06/17 1044  BP: 140/88  Pulse: 75  Resp: 18  Temp: 98.1 F (36.7 C)  SpO2: 100%    Physical Exam  Constitutional: She is oriented to person, place, and time and well-developed, well-nourished, and in no distress.  HENT:   Head: Normocephalic and atraumatic.  Mouth/Throat: Oropharynx is clear and moist. No oropharyngeal exudate.  Eyes: Pupils  are equal, round, and reactive to light. No scleral icterus.  Neck: Neck supple.  Cardiovascular: Normal rate, regular rhythm, normal heart sounds and intact distal pulses.   Pulmonary/Chest: Effort normal and breath sounds normal. No respiratory distress. She has no wheezes.  Abdominal: Soft. Bowel sounds are normal. She exhibits no distension. There is no tenderness.  Musculoskeletal: She exhibits no edema.  Lymphadenopathy:    She has no cervical adenopathy.  Neurological: She is alert and oriented to person, place, and time.  Skin: Skin is warm and dry. No rash noted.  Psychiatric: Mood and affect normal.   Breast 04/06/2017 (worse than last week)   Scalp 04/06/2017 (about the same today from last week)   LABORATORY DATA:  CBC    Component Value Date/Time   WBC 6.5 04/06/2017 1007   WBC 12.9 (H) 02/14/2017 0612   RBC 4.44 04/06/2017 1007   RBC 4.15 02/14/2017 0612   HGB 9.1 (L) 04/06/2017 1007   HCT 29.4 (L) 04/06/2017 1007   PLT 252 04/06/2017 1007   MCV 66.2 (L) 04/06/2017 1007   MCH 20.5 (L) 04/06/2017 1007   MCH 20.7 (L) 02/14/2017 0612   MCHC 31.0 (L) 04/06/2017 1007   MCHC 32.0 02/14/2017 0612   RDW 20.6 (H) 04/06/2017 1007   LYMPHSABS 3.0 04/06/2017 1007   MONOABS 0.4 04/06/2017 1007   EOSABS 0.0 04/06/2017 1007   BASOSABS 0.3 (H) 04/06/2017 1007    CMP     Component Value Date/Time   NA 136 04/06/2017 1007   K 3.5 04/06/2017 1007   CL 100 (L) 02/14/2017 0612   CO2 27 04/06/2017 1007   GLUCOSE 115 04/06/2017 1007   BUN 11.8 04/06/2017 1007   CREATININE 0.7 04/06/2017 1007   CALCIUM 10.0 04/06/2017 1007   PROT 7.3 04/06/2017 1007   ALBUMIN 3.0 (L) 04/06/2017 1007   AST 46 (H) 04/06/2017 1007   ALT 24 04/06/2017 1007   ALKPHOS 61 04/06/2017 1007   BILITOT 0.61 04/06/2017 1007   GFRNONAA >60 02/14/2017 0612   GFRAA >60 02/14/2017  0612     ASSESSMENT and PLAN:   Bilateral breast cancer ([place]) Paula Navarro will proceed with Eribulin cycle 6 day 8 today.  Her breast lesion continues to grow.  She has appointment with Dr. Lisbeth Renshaw for re-consult next week.  I sent them a copy of my office note last week when we re-consulted them.   For her pain related to her tumor, she still is taking dilaudid around the clock, and her pain remains at a 7.  I have prescribed Fentanyl Patches.  I gave her detailed instructions on how to take them, and she verbalized understanding.  She is going to try them for a week and then come and see me next week and we will see how they are working.    I sent in Miralax and Colace for Paula Navarro and encouraged her to take these regularly.    Paula Navarro will return in one week for labs/ follow up.    All questions were answered. The patient knows to call the clinic with any problems, questions or concerns. We can certainly see the patient much sooner if necessary.  A total of (30) minutes of face-to-face time was spent with this patient with greater than 50% of that time in counseling and care-coordination.  This note was electronically signed. Scot Dock, NP 04/06/2017

## 2017-04-06 NOTE — Progress Notes (Signed)
NO need for 2 hours of IVF today per [name] NP

## 2017-04-06 NOTE — Assessment & Plan Note (Addendum)
Paula Navarro will proceed with Eribulin cycle 6 day 8 today.  Her breast lesion continues to grow.  She has appointment with Dr. Lisbeth Renshaw for re-consult next week.  I sent them a copy of my office note last week when we re-consulted them.   For her pain related to her tumor, she still is taking dilaudid around the clock, and her pain remains at a 7.  I have prescribed Fentanyl Patches.  I gave her detailed instructions on how to take them, and she verbalized understanding.  She is going to try them for a week and then come and see me next week and we will see how they are working.    I sent in Miralax and Colace for [facility] and encouraged her to take these regularly.    Zaya will return in one week for labs/ follow up.

## 2017-04-06 NOTE — Patient Instructions (Signed)
[facility] Discharge Instructions for Patients Receiving Chemotherapy  Today you received the following chemotherapy agents Halogen   To help prevent nausea and vomiting after your treatment, we encourage you to take your nausea medication as directed.    If you develop nausea and vomiting that is not controlled by your nausea medication, call the clinic.   BELOW ARE SYMPTOMS THAT SHOULD BE REPORTED IMMEDIATELY:  *FEVER GREATER THAN 100.5 F  *CHILLS WITH OR WITHOUT FEVER  NAUSEA AND VOMITING THAT IS NOT CONTROLLED WITH YOUR NAUSEA MEDICATION  *UNUSUAL SHORTNESS OF BREATH  *UNUSUAL BRUISING OR BLEEDING  TENDERNESS IN MOUTH AND THROAT WITH OR WITHOUT PRESENCE OF ULCERS  *URINARY PROBLEMS  *BOWEL PROBLEMS  UNUSUAL RASH Items with * indicate a potential emergency and should be followed up as soon as possible.  Feel free to call the clinic you have any questions or concerns. The clinic phone number is [phone].  Please show the [place] at check-in to the Emergency Department and triage nurse.

## 2017-04-06 NOTE — Pre-Procedure Instructions (Signed)
Left breast and scalp [id number]

## 2017-04-09 ENCOUNTER — Telehealth: Payer: Self-pay | Admitting: *Deleted

## 2017-04-09 NOTE — Telephone Encounter (Signed)
Called patient after vm from Friday, asked fax number to send letter to transport [facility], then called [facility] , was given wrong fax number faxed to [facility] [phone] , verification sent and received, 8:46 AM Called patient again to confirm that letter had been faxed that patient appt cannot be changed from tomorrow at 1pm,patient thanked this RN 8:46 AM

## 2017-04-10 ENCOUNTER — Other Ambulatory Visit: Payer: Self-pay | Admitting: Hematology and Oncology

## 2017-04-10 ENCOUNTER — Encounter: Payer: Self-pay | Admitting: Radiation Oncology

## 2017-04-10 ENCOUNTER — Ambulatory Visit
Admission: RE | Admit: 2017-04-10 | Discharge: 2017-04-10 | Disposition: A | Discharge status: Home / Self Care | Payer: Medicare Other | Source: Ambulatory Visit | Attending: Radiation Oncology | Admitting: Radiation Oncology

## 2017-04-10 ENCOUNTER — Telehealth: Payer: Self-pay | Admitting: *Deleted

## 2017-04-10 VITALS — BP 125/64 | HR 84 | Temp 98.6°F | Resp 18 | Ht 62.0 in | Wt 115.6 lb

## 2017-04-10 DIAGNOSIS — I890 Lymphedema, not elsewhere classified: Secondary | ICD-10-CM

## 2017-04-10 DIAGNOSIS — Z87891 Personal history of nicotine dependence: Secondary | ICD-10-CM | POA: Diagnosis not present

## 2017-04-10 DIAGNOSIS — Z510 Encounter for antineoplastic radiation therapy: Secondary | ICD-10-CM | POA: Insufficient documentation

## 2017-04-10 DIAGNOSIS — Z853 Personal history of malignant neoplasm of breast: Secondary | ICD-10-CM | POA: Diagnosis not present

## 2017-04-10 DIAGNOSIS — J449 Chronic obstructive pulmonary disease, unspecified: Secondary | ICD-10-CM | POA: Insufficient documentation

## 2017-04-10 DIAGNOSIS — Z9889 Other specified postprocedural states: Secondary | ICD-10-CM | POA: Diagnosis not present

## 2017-04-10 DIAGNOSIS — Z923 Personal history of irradiation: Secondary | ICD-10-CM | POA: Diagnosis not present

## 2017-04-10 DIAGNOSIS — Z79899 Other long term (current) drug therapy: Secondary | ICD-10-CM | POA: Insufficient documentation

## 2017-04-10 DIAGNOSIS — Z791 Long term (current) use of non-steroidal anti-inflammatories (NSAID): Secondary | ICD-10-CM | POA: Insufficient documentation

## 2017-04-10 DIAGNOSIS — C770 Secondary and unspecified malignant neoplasm of lymph nodes of head, face and neck: Secondary | ICD-10-CM

## 2017-04-10 DIAGNOSIS — Z9049 Acquired absence of other specified parts of digestive tract: Secondary | ICD-10-CM | POA: Insufficient documentation

## 2017-04-10 DIAGNOSIS — F419 Anxiety disorder, unspecified: Secondary | ICD-10-CM | POA: Insufficient documentation

## 2017-04-10 DIAGNOSIS — C7951 Secondary malignant neoplasm of bone: Secondary | ICD-10-CM | POA: Insufficient documentation

## 2017-04-10 DIAGNOSIS — L989 Disorder of the skin and subcutaneous tissue, unspecified: Secondary | ICD-10-CM

## 2017-04-10 DIAGNOSIS — C7800 Secondary malignant neoplasm of unspecified lung: Secondary | ICD-10-CM | POA: Insufficient documentation

## 2017-04-10 DIAGNOSIS — Z803 Family history of malignant neoplasm of breast: Secondary | ICD-10-CM | POA: Diagnosis not present

## 2017-04-10 DIAGNOSIS — Z90710 Acquired absence of both cervix and uterus: Secondary | ICD-10-CM | POA: Diagnosis not present

## 2017-04-10 DIAGNOSIS — Z885 Allergy status to narcotic agent status: Secondary | ICD-10-CM | POA: Diagnosis not present

## 2017-04-10 DIAGNOSIS — G8929 Other chronic pain: Secondary | ICD-10-CM | POA: Diagnosis not present

## 2017-04-10 NOTE — Telephone Encounter (Signed)
CALLED PATIENT TO INFORM OF PT APPT. ON 04-11-17 - ARRIVAL TIME - 3 PM @ [facility], LVM FOR A RETURN CALL

## 2017-04-11 ENCOUNTER — Ambulatory Visit: Payer: Medicare Other | Admitting: Physical Therapy

## 2017-04-11 ENCOUNTER — Telehealth: Payer: Self-pay | Admitting: *Deleted

## 2017-04-11 ENCOUNTER — Encounter: Payer: Self-pay | Admitting: *Deleted

## 2017-04-11 NOTE — Progress Notes (Signed)
Radiation Oncology         [phone] ________________________________  Reconsult   Name: Paula Navarro MRN: [id number]  Date: 04/10/2017  DOB: 06-22-1951  CC:[name], MD (Inactive)  Nicholas Lose, MD     REFERRING PHYSICIAN: Nicholas Lose, MD   DIAGNOSIS: The primary encounter diagnosis was Lymphedema. Diagnoses of Scalp lesion and Metastasis to supraclavicular lymph node [facility]) were also pertinent to this visit.   HISTORY OF PRESENT ILLNESS:Paula Navarro is a 66 y.o. female who is seen for an follow up new visit regarding her history of metastatic recurrent bilateral breast cancer.  The patient has been treated definitively in our clinic previously and received adjuvant radiation treatment from December 2015 through January 2016 after bilateral lumpectomies and received 45 gray to the breasts, and both supraclavicular fossas as well as a 16 gray boost to the lumpectomy cavities.  She developed recurrence in the left chest wall in February 2017 with multiple subcutaneous nodules. The patient has proceeded with chemotherapy through medical oncology here at the [facility]. The patient's recent imaging included a CT scan of the chest on 04/06/2016. There is interval progression of some pre-existing pulmonary nodules as well as interval progression of disease within the left supra-auricular region/left upper chest.  The patient was seen in November 2017 for consideration of additional radiation treatment to this region, however she has put off treatment due to social issues. She underwent a whole body scan on 09/20/16. This revealed asymmetric uptake at the left scapula and adjacent rib. She was planning to proceed with radiotherapy, however chemotherapy was recommended, and she did not pursue radiation after her visit in April 2018. She has been on [place] q 3 weeks since May 2018. In that time frame she's developed a large scalp metastasis and the left breast/chest wall mass that has  gotten smaller and is now 4.3 x 3.5 cm on CT imaging from 02/05/17. She continues to have improvement in a precarinal node as well. Given her symptoms from the left breast tumor and scalp tumor, she's seen to consider palliative radiotherapy to these locations.  On review of systems, the patient reports that she is doing well overall. She is tolerating her chemo regimen and reports that she has drainage and tightness of her scalp lesion, and tightness over the left chest wall lesion. She's concerned that it will erode through the skin. denies any chest pain, shortness of breath, cough, fevers, chills, night sweats, unintended weight changes. She denies any bowel or bladder disturbances, and denies abdominal pain, nausea or vomiting. She denies any new musculoskeletal or joint aches or pains, new skin lesions or concerns. A complete review of systems is obtained and is otherwise negative.    PREVIOUS RADIATION THERAPY:   06/30/14-08/17/14:   45 gray to the bilateral breasts and bilateral supraclavicular fossa as well as a 16 gray boost to the lumpectomy cavities   PAST MEDICAL HISTORY:  Past Medical History:  Diagnosis Date  . Anemia   . Anxiety   . Atrial septal defect 1996   Surgical repair in 1996  . Breast cancer ([place]) dx'd 06/2013  . Chest pain    Admitted to [facility] in 09/2011; refused stress test  . Chronic bronchitis   . Chronic pain   . COPD (chronic obstructive pulmonary disease) ([place])    on xray  . Metastatic cancer to lung ([place]) dx'd 2017  . Palpitation    Tachycardia reported by monitor clerk during a symptomatic spell  .  Radiation 06/30/14-08/17/14   Bilateral Breast  . Tobacco abuse    60 pack years; 1.5 packs per day  . Wears dentures    top    PAST SURGICAL HISTORY: Past Surgical History:  Procedure Laterality Date  . ASD REPAIR, OSTIUM PRIMUM  1996   dr Roxy Horseman  . AXILLARY LYMPH NODE DISSECTION Bilateral 04/08/2014   Procedure:  BILATERAL AXILLARY LYMPH NODE  DISSECTION;  Surgeon: Autumn Messing III, MD;  Location: [facility];  Service: General;  Laterality: Bilateral;  . BREAST BIOPSY Bilateral   . BREAST LUMPECTOMY WITH RADIOACTIVE SEED LOCALIZATION Bilateral 04/08/2014   Procedure: BILATERAL  RADIOACTIVE SEED LOCALIZATION LUMPECTOMY ;  Surgeon: Autumn Messing III, MD;  Location: [facility];  Service: General;  Laterality: Bilateral;  . CESAREAN SECTION     x3  . CHOLECYSTECTOMY    . LAPAROTOMY N/A 02/13/2017   Procedure: MINI EXPLORATORY LAPAROTOMY;  Surgeon: Everitt Amber, MD;  Location: [facility];  Service: Gynecology;  Laterality: N/A;  . open heart surgery    . PORT A CATH REVISION  1/15   put in   . ROBOTIC ASSISTED TOTAL HYSTERECTOMY WITH BILATERAL SALPINGO OOPHERECTOMY Bilateral 02/13/2017   Procedure: XI ROBOTIC ASSISTED TOTAL HYSTERECTOMY WITH BILATERAL SALPINGO OOPHORECTOMY WITH LYSIS OF ADHESIONS, UTERUS GREATER THAN 250GRAMS;  Surgeon: Everitt Amber, MD;  Location: [facility];  Service: Gynecology;  Laterality: Bilateral;  . TUBAL LIGATION       FAMILY HISTORY:  Family History  Problem Relation Age of Onset  . Breast cancer Maternal Aunt      SOCIAL HISTORY:  reports that she quit smoking about 4 years ago. Her smoking use included Cigarettes. She has a 60.00 pack-year smoking history. She has never used smokeless tobacco. She reports that she does not drink alcohol or use drugs. The patient is widowed and resides in [place].    ALLERGIES: Codeine and Morphine and related   MEDICATIONS:  Current Outpatient Prescriptions  Medication Sig Dispense Refill  . B Complex-C (SUPER B COMPLEX PO) Take 1 tablet by mouth daily.    . calcium-vitamin D (OSCAL-500) 500-400 MG-UNIT tablet Take 1 tablet by mouth 2 (two) times daily. (Patient taking differently: Take 1 tablet by mouth 2 (two) times daily. Pt takes once daily.) 60 tablet 3  . Clobetasol Prop Emollient Base (CLOBETASOL PROPIONATE E) 0.05 % emollient cream Apply 1  application topically 2 (two) times daily. 30 g 0  . DILAUDID 2 MG tablet Take 1 tablet (2 mg total) by mouth every 4 (four) hours as needed for severe pain. 90 tablet 0  . docusate sodium (COLACE) 100 MG capsule Take 1 capsule (100 mg total) by mouth 2 (two) times daily. 10 capsule 0  . gabapentin (NEURONTIN) 300 MG capsule TAKE 2 CAPSULES (600 MG TOTAL) BY MOUTH 3 (THREE) TIMES DAILY. 180 capsule 2  . ibuprofen (ADVIL,MOTRIN) 600 MG tablet Take 1 tablet (600 mg total) by mouth every 8 (eight) hours as needed (mild pain). 30 tablet 0  . lidocaine-prilocaine (EMLA) cream Apply to affected area once 30 g 3  . LORazepam (ATIVAN) 1 MG tablet TAKE 1 TABLET BY MOUTH EVERYDAY AT BEDTIME 30 tablet 0  . polyethylene glycol (MIRALAX / GLYCOLAX) packet Take 17 g by mouth 2 (two) times daily. 14 each 0  . prochlorperazine (COMPAZINE) 10 MG tablet Take 1 tablet (10 mg total) by mouth every 6 (six) hours as needed (Nausea or vomiting). 30 tablet 1  . zolpidem (AMBIEN) 10 MG  tablet Take 1 tablet (10 mg total) by mouth at bedtime. 30 tablet 2  . dicyclomine (BENTYL) 10 MG capsule Take 1 capsule (10 mg total) by mouth 3 (three) times daily as needed for spasms. 30 capsule 0  . fentaNYL (DURAGESIC - DOSED MCG/HR) 25 MCG/HR patch Place 1 patch (25 mcg total) onto the skin every 3 (three) days. (Patient not taking: Reported on 04/10/2017) 5 patch 0  . ferrous gluconate (FERGON) 324 MG tablet Take 1 tablet (324 mg total) by mouth 2 (two) times daily with a meal. (Patient not taking: Reported on 04/10/2017) 60 tablet 3  . nystatin (MYCOSTATIN) 100000 UNIT/ML suspension Take 5 mLs (500,000 Units total) by mouth 4 (four) times daily. (Patient not taking: Reported on 04/10/2017) 60 mL 0   No current facility-administered medications for this encounter.    Facility-Administered Medications Ordered in Other Encounters  Medication Dose Route Frequency Provider Last Rate Last Dose  . sodium chloride flush (NS) 0.9 % injection  10 mL  10 mL Intracatheter PRN Nicholas Lose, MD   10 mL at 03/16/17 1054      PHYSICAL EXAM:  height is 5\' 2"  (1.575 m) and weight is 115 lb 9.6 oz (52.4 kg). Her oral temperature is 98.6 F (37 C). Her blood pressure is 125/64 and her pulse is 84. Her respiration is 18 and oxygen saturation is 100%.   In general this is a chronically ill appearing african american woman in no acute distress. She's alert and oriented x4 and appropriate throughout the examination. Cardiopulmonary assessment is negative for acute distress and she exhibits normal effort. Patient presents in a wheelchair. Along the upper anterior chest wall abutting the axilla, there is palpable tumor about 6 x 7cm wide from base of clavicle diagonal across the chest wall above the lumpectomy scar not involving the axilla. There is again 2+ edema of the LUE consistent with lymphedema. Along the scalp, there is a 3 x 3 cm lesion that is noted over the midline of her scalp with about 2 cm of projection. Is its nodular and has a small eschar along the upper aspect of the lesion.    ECOG = 2  0 - Asymptomatic (Fully active, able to carry on all predisease activities without restriction)  1 - Symptomatic but completely ambulatory (Restricted in physically strenuous activity but ambulatory and able to carry out work of a light or sedentary nature. For example, light housework, office work)  2 - Symptomatic, <50% in bed during the day (Ambulatory and capable of all self care but unable to carry out any work activities. Up and about more than 50% of waking hours)  3 - Symptomatic, >50% in bed, but not bedbound (Capable of only limited self-care, confined to bed or chair 50% or more of waking hours)  4 - Bedbound (Completely disabled. Cannot carry on any self-care. Totally confined to bed or chair)  5 - Death   Eustace Pen MM, Creech RH, Tormey DC, et al. [phone]). "Toxicity and response criteria of the [facility]". [place] Oncol. 5 (6): 649-55    LABORATORY DATA:  Lab Results  Component Value Date   WBC 6.5 04/06/2017   HGB 9.1 (L) 04/06/2017   HCT 29.4 (L) 04/06/2017   MCV 66.2 (L) 04/06/2017   PLT 252 04/06/2017   Lab Results  Component Value Date   NA 136 04/06/2017   K 3.5 04/06/2017   CL 100 (L) 02/14/2017   CO2 27 04/06/2017  Lab Results  Component Value Date   ALT 24 04/06/2017   AST 46 (H) 04/06/2017   ALKPHOS 61 04/06/2017   BILITOT 0.61 04/06/2017      RADIOGRAPHY: Dg Hip Unilat With Pelvis 2-3 Views Right  Result Date: 03/16/2017 CLINICAL DATA:  Right-sided hip pain for 1 week, initial encounter EXAM: DG HIP (WITH OR WITHOUT PELVIS) 2-3V RIGHT COMPARISON:  None. FINDINGS: Pelvic ring is intact. Mild degenerative changes in the lumbar spine and hip joints are noted bilaterally. No acute fracture or dislocation is noted. No soft tissue abnormality is seen. Fecal material is noted throughout colon. IMPRESSION: No acute abnormality noted.  Mild degenerative changes are seen. Electronically Signed   By: Inez Catalina M.D.   On: 03/16/2017 11:47       IMPRESSION/PLAN:  1.  Metastatic bilateral breast cancer with chest wall recurrence as well as local failure in the left supraclavicular region previously with scalp metastasis. Dr. Lisbeth Renshaw reviewed the patient's recent course and reviews the options for treatment.  We discussed the risks, benefits, short, and long term effects of radiotherapy, and the patient is interested in proceeding. Dr. Lisbeth Renshaw discusses the delivery and logistics of radiotherapy, and he would anticipate a 3 week course to the chest wall and scalp. She is interested in moving forward. Written consent is obtained and placed in the chart, a copy was provided to the patient. She will simulate on 04/13/17 provided the weather is not preventing her travel to [place] from [place]. 2. Lymphedema. The patient will continue to work with PT at [facility] for her  lymphedema.  In a visit lasting 45 minutes, greater than 50% of the time was spent face to face discussing the patient's disease, current needs, and coordinating the patient's care.   The above documentation reflects my direct findings during this shared patient visit. Please see the separate note by Dr.Moody on this date for the remainder of the patient's plan of care.     Carola Rhine, PAC

## 2017-04-11 NOTE — Telephone Encounter (Signed)
Voicemail:  "I need to speak with Dr. Geralyn Flash nurse.  Call me [phone]."   This triage nurse returned call.    "Fentanyl pain patch ordered last week but I'm afraid to wear it.  I read the information sheet that reads it can cause death.  I take diluadid and ibuprofen for pain."  Confirms increased pain when asked with this call.  "The tumor is as big as a softball.  It does hurt more that I can hardly move my left arm.  I'll be there Friday to begin radiation to my chest wall and scalp."    Explained it is her choice.  Medication absorbed through skin to provide extended timed release of pain medication.  Dilaudid  To be used for "breakthrough" pain if needed.  Please call [phone] if you decide to try patch and experience any new symptoms, side effects or changes.   "I'll try it.  I think I'd rather put in on in the mornings instead of the afternoons."  Encouraged to change about the same time every 72 hours/three days.   No further questions.

## 2017-04-11 NOTE — Progress Notes (Signed)
Faxed to [employer] transportation letter to have patien there at [place] on 04/13/17 at 845am to register for 9am ct simulation, faxed to [phone], confirmation received at 950 am 9:54 AM

## 2017-04-11 NOTE — Telephone Encounter (Signed)
Called patient home and cell phone no answer, truied to return a vm she had left , will try again klater 9:13 AM

## 2017-04-12 NOTE — Progress Notes (Signed)
[place] [facility] Follow up:    [name], MD (Inactive) No address on file   DIAGNOSIS: Cancer Staging Bilateral breast cancer [facility]) Staging form: Breast, AJCC 7th Edition - Clinical: Stage IIB (T2, N1, cM0) - Unsigned Staging comments: Staged at breast conference 08/13/13  - Pathologic: No stage assigned - Unsigned  Endometrial cancer [facility]) Staging form: Corpus Uteri - Carcinoma and Carcinosarcoma, AJCC 8th Edition - Pathologic stage from 02/13/2017: FIGO Stage IIIA, calculated as Stage Unknown (pT3a, pNX, cM0) - Signed by Everitt Amber, MD on 03/14/2017 - Clinical: No stage assigned - Unsigned   SUMMARY OF ONCOLOGIC HISTORY:   Bilateral breast cancer ([place])   07/23/2013 Mammogram    Bilateral breast masses. 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 N1 M0 stage IIB ER 100%, PR 0%, HER-2       06/17/2014 -  Radiation Therapy    Adjuvant radiation therapy      09/14/2014 -  Anti-estrogen oral therapy   Anastrozole 1 mg daily      05/28/2015 Imaging    CT scans: Enlarging subpectoral masses 3.1 x 3.5 cm, posteriorly lower density mass 4.5 x 2.1 cm, several right-sided lung nodules right lower lobe 1.4 cm, 3 other right lung nodules, 1.7 cm right iliac bone lesion      06/21/2015 Procedure    Left subpectoral mass biopsy: Invasive high-grade ductal carcinoma ER 5%, PR 0%, HER-2 negative ratio 1.29      09/01/2015 Imaging    Left chest wall mass increased in size 7.2 x 5.1 cm, multiple subcutaneous nodules, increase in the lung nodules both in number as well as in the size of existing nodules      09/10/2015 -  Chemotherapy    Carboplatin, gemcitabine days 1 and 8 q 3 weeks, carboplatin discontinued for neuropathy (treatment break from 01/20/2016 to 03/24/2016); Added Carboplatin back 04/14/16 (for progression)      01/26/2016 Imaging    Marked improvement in size of lung nodules with many of the nodules resolved index right lower lobe nodule 1.9 x 1.8 cm is now 0.7 x 0.6 cm, reduction in the size of left breast mass 7.2 cm down to 2.6 cm, enlargement in the hypodense mass in the uterus      04/06/2016 Imaging    CT chest: Interval progression of pre-existing lung nodules new lung metastases (59m, 154m 2.9 cm, 1.6  cm), interval progression of disease in the left breast 4.2 cm (was 2.6 cm), additional nodules 2.3 cm and 3.2 cm; CT head: scalp mass 1.9 cm      06/27/2016 Imaging    Ct chest: Stable lesions in lt breast deep to Left pectoralis, cystic lesion loc ant in left breast inc compared to previous; decrease in lung nodules (28m to 3 mm; 12 mm to 7 mm, RML nodule 2.9 cm to 1.8 cm, RLL 170mto 7 mm)      11/14/2016 Imaging    CT CAP:RP and retrocrural lymphadenopathy, large uterine fibroids with necrosis; MRI Abd: RP lymphadenopathy CT chest 11/20/16: Worsening bulky conglomeration of mediastinal and right hilar lymph nodes, lung nodules subcentimeter size slight increase, left breast mass  was 3.7 x 5.8 cm now 5.4 x 7.4 cm      12/08/2016 -  Chemotherapy    Palliative chemotherapy with Halaven days 1 and 8 every 3 weeks        Endometrial cancer ([facility] [place]  02/13/2017 Surgery    Hysterectomy with BSO: HG Serous carcinoma 7.7 cm (>50% Myometrial inv)involving Uterus and Bilateral ovaries, Omentum: No carcinoma; T3a (FIGO stage IIIa)       CURRENT THERAPY: Eribulin, radiation  INTERVAL HISTORY: Paula Spinner89.o. female returns for evaluation.  She is currently day 15 after receiving Eribulin. She receives this on day 1 and day 8 of a 21 day cycle.  She is feeling puny today.  She says she feels like she needs IV fluids.  She is nauseated, but has not yet vomited.  She is drinking fluids, and her bowels have been normal.  She has been using the fentanyl patches and reports improved pain relief.  She did meet with Dr. MoLisbeth Renshawnd will start radiation next Wednesday.     Patient Active Problem List   Diagnosis Date Noted  . Endometrial cancer (HCNew Rochelle05/21/2018  . Protein-calorie malnutrition, severe 11/15/2016  . UTI (urinary tract infection) 11/14/2016  . COPD (chronic obstructive pulmonary disease) (HCFoot[place]  . Metastasis to supraclavicular lymph node (HCHartville11/19/2017  . Chemotherapy-induced thrombocytopenia 06/13/2016  . Scalp lesion 04/01/2016  . Encounter for central line care 12/08/2015  . Port catheter in place 11/19/2015  . Insomnia 11/01/2015  . Microcytic anemia 09/28/2015  . Lung metastases (HCAdin02/04/2016  . Bone metastases (HCEast Hon[place]12/20/2016  . Goals of care, counseling/discussion 07/13/2015  . Metastatic breast cancer (HCHermitage11/29/2016  . Chronic pain 03/16/2015  . Vaginal bleeding 09/24/2014  . Postherpetic neuralgia 09/03/2014  . Lymphedema 09/03/2014  . Suspected herpes zoster left C5 distribution 08/14/2014  . Neuropathy due to chemotherapeutic drug (HCJuntura08/10/2013  . Anxiety 03/03/2014  . Bilateral breast cancer (HCCuster01/06/2014  . Palpitation   .  Chest pain   . Atrial septal defect   . Laboratory test 11/25/2011  . Chronic bronchitis   . Tobacco abuse     is allergic to codeine and morphine and related.  MEDICAL HISTORY: Past Medical History:  Diagnosis Date  . Anemia   . Anxiety   . Atrial septal defect 1996   Surgical repair in 1996  . Breast cancer (HCMoss Bluffdx'd 06/2013  . Chest pain    Admitted to [facility] in [facility]/2011; refused stress test  . Chronic bronchitis   . Chronic pain   . COPD (chronic obstructive pulmonary disease) (HCMojave Ranch Estates   on xray  . Metastatic cancer to lung (HCConynghamdx'd 2017  . Palpitation    Tachycardia  reported by monitor clerk during a symptomatic spell  . Radiation 06/30/14-08/17/14   Bilateral Breast  . Tobacco abuse    60 pack years; 1.5 packs per day  . Wears dentures    top    SURGICAL HISTORY: Past Surgical History:  Procedure Laterality Date  . ASD REPAIR, OSTIUM PRIMUM  1996   dr Roxy Horseman  . AXILLARY LYMPH NODE DISSECTION Bilateral 04/08/2014   Procedure:  BILATERAL AXILLARY LYMPH NODE DISSECTION;  Surgeon: Autumn Messing III, MD;  Location: [facility];  Service: General;  Laterality: Bilateral;  . BREAST BIOPSY Bilateral   . BREAST LUMPECTOMY WITH RADIOACTIVE SEED LOCALIZATION Bilateral 04/08/2014   Procedure: BILATERAL  RADIOACTIVE SEED LOCALIZATION LUMPECTOMY ;  Surgeon: Autumn Messing III, MD;  Location: [facility];  Service: General;  Laterality: Bilateral;  . CESAREAN SECTION     x3  . CHOLECYSTECTOMY    . LAPAROTOMY N/A 02/13/2017   Procedure: MINI EXPLORATORY LAPAROTOMY;  Surgeon: Everitt Amber, MD;  Location: [facility];  Service: Gynecology;  Laterality: N/A;  . open heart surgery    . PORT A CATH REVISION  1/15   put in   . ROBOTIC ASSISTED TOTAL HYSTERECTOMY WITH BILATERAL SALPINGO OOPHERECTOMY Bilateral 02/13/2017   Procedure: XI ROBOTIC ASSISTED TOTAL HYSTERECTOMY WITH BILATERAL SALPINGO OOPHORECTOMY WITH LYSIS OF ADHESIONS, UTERUS GREATER THAN 250GRAMS;  Surgeon: Everitt Amber, MD;  Location: [facility];  Service: Gynecology;  Laterality: Bilateral;  . TUBAL LIGATION      SOCIAL HISTORY: Social History   Social History  . Marital status: Widowed    Spouse name: N/A  . Number of children: N/A  . Years of education: N/A   Occupational History  . Not on file.   Social History Main Topics  . Smoking status: Former Smoker    Packs/day: 1.50    Years: 40.00    Types: Cigarettes    Quit date: 08/01/2012  . Smokeless tobacco: Never Used  . Alcohol use No  . Drug use: No  . Sexual activity: Not Currently   Other Topics Concern  . Not on file   Social History Narrative  . No narrative on file    FAMILY HISTORY: Family History  Problem Relation Age of Onset  . Breast cancer Maternal Aunt     Review of Systems  Constitutional: Positive for fatigue. Negative for appetite change, chills, fever and unexpected weight change.  HENT:   Negative for hearing loss.   Eyes: Negative for eye problems and icterus.  Respiratory: Negative for chest tightness, cough and shortness of breath.   Cardiovascular: Negative for chest pain, leg swelling and palpitations.  Gastrointestinal: Positive for nausea. Negative for abdominal distention, abdominal pain, constipation, diarrhea and vomiting.  Skin: Negative for itching and rash.  Neurological: Positive for numbness (unchanged peripheral neuropathy). Negative for dizziness and extremity weakness.  Hematological: Negative for adenopathy. Does not bruise/bleed easily.  Psychiatric/Behavioral: Negative for depression. The patient is not nervous/anxious.       PHYSICAL EXAMINATION  ECOG PERFORMANCE STATUS: 3 - Symptomatic, >50% confined to bed  Vitals:   04/13/17 1042  BP: 119/65  Pulse: 74  Resp: 18  Temp: 98.1 F (36.7 C)  SpO2: 100%    Physical Exam  Constitutional: She is oriented to person, place, and time and well-developed, well-nourished, and in no distress.  HENT:  Head: Normocephalic and  atraumatic.  Mouth/Throat: Oropharynx is clear and moist. No oropharyngeal exudate.  Eyes: Pupils are equal, round, and reactive  to light. No scleral icterus.  Neck: Neck supple.  Cardiovascular: Normal rate, regular rhythm and normal heart sounds.   Pulmonary/Chest: Effort normal and breath sounds normal.  Breast lesion unchanged  Abdominal: Soft. Bowel sounds are normal.  Lymphadenopathy:    She has no cervical adenopathy.  Neurological: She is alert and oriented to person, place, and time.  Skin:  Scalp lesion covered by bandage that is clean dry and intact  Psychiatric: Mood and affect normal.    LABORATORY DATA:  CBC    Component Value Date/Time   WBC 3.9 04/13/2017 1032   WBC 12.9 (H) 02/14/2017 0612   RBC 4.40 04/13/2017 1032   RBC 4.15 02/14/2017 0612   HGB 9.3 (L) 04/13/2017 1032   HCT 29.0 (L) 04/13/2017 1032   PLT 362 04/13/2017 1032   MCV 66.0 (L) 04/13/2017 1032   MCH 21.1 (L) 04/13/2017 1032   MCH 20.7 (L) 02/14/2017 0612   MCHC 32.0 04/13/2017 1032   MCHC 32.0 02/14/2017 0612   RDW 21.2 (H) 04/13/2017 1032   LYMPHSABS 2.5 04/13/2017 1032   MONOABS 0.8 04/13/2017 1032   EOSABS 0.0 04/13/2017 1032   BASOSABS 0.0 04/13/2017 1032    CMP     Component Value Date/Time   NA 136 04/13/2017 1032   K 3.3 (L) 04/13/2017 1032   CL 100 (L) 02/14/2017 0612   CO2 29 04/13/2017 1032   GLUCOSE 112 04/13/2017 1032   BUN 9.9 04/13/2017 1032   CREATININE 0.7 04/13/2017 1032   CALCIUM 10.1 04/13/2017 1032   PROT 7.3 04/13/2017 1032   ALBUMIN 3.1 (L) 04/13/2017 1032   AST 52 (H) 04/13/2017 1032   ALT 26 04/13/2017 1032   ALKPHOS 63 04/13/2017 1032   BILITOT 0.84 04/13/2017 1032   GFRNONAA >60 02/14/2017 0612   GFRAA >60 02/14/2017 0612    ASSESSMENT and PLAN:   Bilateral breast cancer (HCC) [place] is doing moderately well today.  She is not feeling great, so we will give her IV fluids and IV Zofran.  She is not due for treatment.  I reviewed with Dr. Lindi Adie,  and he would like to hold off on continuing Eribulin during radiation therapy.  She will return after the radiation is completed.  She is doing better from a pain standpoint and I reinforced with her today regarding how to use her fentanyl patches.  It sounds like she is using them correctly.  She says her pain is under better control.     All questions were answered. The patient knows to call the clinic with any problems, questions or concerns. We can certainly see the patient much sooner if necessary.  A total of (30) minutes of face-to-face time was spent with this patient with greater than 50% of that time in counseling and care-coordination.  This note was electronically signed. Scot Dock, NP 04/13/2017

## 2017-04-13 ENCOUNTER — Other Ambulatory Visit (HOSPITAL_BASED_OUTPATIENT_CLINIC_OR_DEPARTMENT_OTHER): Payer: Medicare Other

## 2017-04-13 ENCOUNTER — Ambulatory Visit (HOSPITAL_BASED_OUTPATIENT_CLINIC_OR_DEPARTMENT_OTHER): Payer: Medicare Other

## 2017-04-13 ENCOUNTER — Ambulatory Visit (HOSPITAL_BASED_OUTPATIENT_CLINIC_OR_DEPARTMENT_OTHER): Payer: Medicare Other | Admitting: Adult Health

## 2017-04-13 ENCOUNTER — Encounter: Payer: Self-pay | Admitting: Adult Health

## 2017-04-13 ENCOUNTER — Ambulatory Visit
Admission: RE | Admit: 2017-04-13 | Discharge: 2017-04-13 | Disposition: A | Discharge status: Home / Self Care | Payer: Medicare Other | Source: Ambulatory Visit | Attending: Radiation Oncology | Admitting: Radiation Oncology

## 2017-04-13 VITALS — BP 119/65 | HR 74 | Temp 98.1°F | Resp 18 | Ht 62.0 in

## 2017-04-13 DIAGNOSIS — J449 Chronic obstructive pulmonary disease, unspecified: Secondary | ICD-10-CM | POA: Diagnosis not present

## 2017-04-13 DIAGNOSIS — C50011 Malignant neoplasm of nipple and areola, right female breast: Secondary | ICD-10-CM

## 2017-04-13 DIAGNOSIS — Z510 Encounter for antineoplastic radiation therapy: Secondary | ICD-10-CM | POA: Diagnosis not present

## 2017-04-13 DIAGNOSIS — R110 Nausea: Secondary | ICD-10-CM | POA: Diagnosis not present

## 2017-04-13 DIAGNOSIS — C50919 Malignant neoplasm of unspecified site of unspecified female breast: Secondary | ICD-10-CM

## 2017-04-13 DIAGNOSIS — C50012 Malignant neoplasm of nipple and areola, left female breast: Secondary | ICD-10-CM | POA: Diagnosis not present

## 2017-04-13 DIAGNOSIS — C50912 Malignant neoplasm of unspecified site of left female breast: Secondary | ICD-10-CM

## 2017-04-13 DIAGNOSIS — C7951 Secondary malignant neoplasm of bone: Secondary | ICD-10-CM

## 2017-04-13 DIAGNOSIS — Z170 Estrogen receptor positive status [ER+]: Secondary | ICD-10-CM

## 2017-04-13 DIAGNOSIS — C50911 Malignant neoplasm of unspecified site of right female breast: Secondary | ICD-10-CM

## 2017-04-13 DIAGNOSIS — C770 Secondary and unspecified malignant neoplasm of lymph nodes of head, face and neck: Secondary | ICD-10-CM

## 2017-04-13 LAB — CBC WITH DIFFERENTIAL/PLATELET
BASO%: 0.9 % (ref 0.0–2.0)
BASOS ABS: 0 10*3/uL (ref 0.0–0.1)
EOS%: 0.1 % (ref 0.0–7.0)
Eosinophils Absolute: 0 10*3/uL (ref 0.0–0.5)
HCT: 29 % — ABNORMAL LOW (ref 34.8–46.6)
HGB: 9.3 g/dL — ABNORMAL LOW (ref 11.6–15.9)
LYMPH%: 62.3 % — ABNORMAL HIGH (ref 14.0–49.7)
MCH: 21.1 pg — AB (ref 25.1–34.0)
MCHC: 32 g/dL (ref 31.5–36.0)
MCV: 66 fL — ABNORMAL LOW (ref 79.5–101.0)
MONO#: 0.8 10*3/uL (ref 0.1–0.9)
MONO%: 20.8 % — AB (ref 0.0–14.0)
NEUT#: 0.6 10*3/uL — ABNORMAL LOW (ref 1.5–6.5)
NEUT%: 15.9 % — AB (ref 38.4–76.8)
Platelets: 362 10*3/uL (ref 145–400)
RBC: 4.4 10*6/uL (ref 3.70–5.45)
RDW: 21.2 % — AB (ref 11.2–14.5)
WBC: 3.9 10*3/uL (ref 3.9–10.3)
lymph#: 2.5 10*3/uL (ref 0.9–3.3)

## 2017-04-13 LAB — COMPREHENSIVE METABOLIC PANEL
ALT: 26 U/L (ref 0–55)
AST: 52 U/L — AB (ref 5–34)
Albumin: 3.1 g/dL — ABNORMAL LOW (ref 3.5–5.0)
Alkaline Phosphatase: 63 U/L (ref 40–150)
Anion Gap: 9 mEq/L (ref 3–11)
BUN: 9.9 mg/dL (ref 7.0–26.0)
CHLORIDE: 98 meq/L (ref 98–109)
CO2: 29 mEq/L (ref 22–29)
Calcium: 10.1 mg/dL (ref 8.4–10.4)
Creatinine: 0.7 mg/dL (ref 0.6–1.1)
EGFR: >90 mL/min/{1.73_m2} (ref >90)
GLUCOSE: 112 mg/dL (ref 70–140)
POTASSIUM: 3.3 meq/L — AB (ref 3.5–5.1)
SODIUM: 136 meq/L (ref 136–145)
Total Bilirubin: 0.84 mg/dL (ref 0.20–1.20)
Total Protein: 7.3 g/dL (ref 6.4–8.3)

## 2017-04-13 MED ORDER — SODIUM CHLORIDE 0.9 % IV SOLN
Freq: Once | INTRAVENOUS | Status: DC
Start: 2017-04-13 — End: 2017-04-13

## 2017-04-13 MED ORDER — HEPARIN SOD (PORK) LOCK FLUSH 100 UNIT/ML IV SOLN
500.0000 [IU] | Freq: Once | INTRAVENOUS | Status: AC | PRN
  Administered 2017-04-13: 500 [IU] via INTRAVENOUS
  Filled 2017-04-13: qty 5

## 2017-04-13 MED ORDER — SODIUM CHLORIDE 0.9% FLUSH
10.0000 mL | INTRAVENOUS | Status: DC | PRN
  Administered 2017-04-13: 10 mL via INTRAVENOUS
  Filled 2017-04-13: qty 10

## 2017-04-13 MED ORDER — ONDANSETRON HCL 4 MG/2ML IJ SOLN
8.0000 mg | Freq: Once | INTRAMUSCULAR | Status: AC
  Administered 2017-04-13: 8 mg via INTRAVENOUS

## 2017-04-13 MED ORDER — ONDANSETRON HCL 4 MG/2ML IJ SOLN
INTRAMUSCULAR | Status: AC
  Filled 2017-04-13: qty 4

## 2017-04-13 MED ORDER — SODIUM CHLORIDE 0.9 % IV SOLN
Freq: Once | INTRAVENOUS | Status: AC
  Administered 2017-04-13: 12:00:00 via INTRAVENOUS

## 2017-04-13 NOTE — Patient Instructions (Signed)
Dehydration, Adult Dehydration is when there is not enough fluid or water in your body. This happens when you lose more fluids than you take in. Dehydration can range from mild to very bad. It should be treated right away to keep it from getting very bad. Symptoms of mild dehydration may include:  Thirst.  Dry lips.  Slightly dry mouth.  Dry, warm skin.  Dizziness. Symptoms of moderate dehydration may include:  Very dry mouth.  Muscle cramps.  Dark pee (urine). Pee may be the color of tea.  Your body making less pee.  Your eyes making fewer tears.  Heartbeat that is uneven or faster than normal (palpitations).  Headache.  Light-headedness, especially when you stand up from sitting.  Fainting (syncope). Symptoms of very bad dehydration may include:  Changes in skin, such as: ? Cold and clammy skin. ? Blotchy (mottled) or pale skin. ? Skin that does not quickly return to normal after being lightly pinched and let go (poor skin turgor).  Changes in body fluids, such as: ? Feeling very thirsty. ? Your eyes making fewer tears. ? Not sweating when body temperature is high, such as in hot weather. ? Your body making very little pee.  Changes in vital signs, such as: ? Weak pulse. ? Pulse that is more than 100 beats a minute when you are sitting still. ? Fast breathing. ? Low blood pressure.  Other changes, such as: ? Sunken eyes. ? Cold hands and feet. ? Confusion. ? Lack of energy (lethargy). ? Trouble waking up from sleep. ? Short-term weight loss. ? Unconsciousness. Follow these instructions at home:  If told by your doctor, drink an ORS: ? Make an ORS by using instructions on the package. ? Start by drinking small amounts, about  cup (120 mL) every 5-10 minutes. ? Slowly drink more until you have had the amount that your doctor said to have.  Drink enough clear fluid to keep your pee clear or pale yellow. If you were told to drink an ORS, finish the ORS  first, then start slowly drinking clear fluids. Drink fluids such as: ? Water. Do not drink only water by itself. Doing that can make the salt (sodium) level in your body get too low (hyponatremia). ? Ice chips. ? Fruit juice that you have added water to (diluted). ? Low-calorie sports drinks.  Avoid: ? Alcohol. ? Drinks that have a lot of sugar. These include high-calorie sports drinks, fruit juice that does not have water added, and soda. ? Caffeine. ? Foods that are greasy or have a lot of fat or sugar.  Take over-the-counter and prescription medicines only as told by your doctor.  Do not take salt tablets. Doing that can make the salt level in your body get too high (hypernatremia).  Eat foods that have minerals (electrolytes). Examples include bananas, oranges, potatoes, tomatoes, and spinach.  Keep all follow-up visits as told by your doctor. This is important. Contact a doctor if:  You have belly (abdominal) pain that: ? Gets worse. ? Stays in one area (localizes).  You have a rash.  You have a stiff neck.  You get angry or annoyed more easily than normal (irritability).  You are more sleepy than normal.  You have a harder time waking up than normal.  You feel: ? Weak. ? Dizzy. ? Very thirsty.  You have peed (urinated) only a small amount of very dark pee during 6-8 hours. Get help right away if:  You have symptoms of   very bad dehydration.  You cannot drink fluids without throwing up (vomiting).  Your symptoms get worse with treatment.  You have a fever.  You have a very bad headache.  You are throwing up or having watery poop (diarrhea) and it: ? Gets worse. ? Does not go away.  You have blood or something green (bile) in your throw-up.  You have blood in your poop (stool). This may cause poop to look black and tarry.  You have not peed in 6-8 hours.  You pass out (faint).  Your heart rate when you are sitting still is more than 100 beats a  minute.  You have trouble breathing. This information is not intended to replace advice given to you by your health care provider. Make sure you discuss any questions you have with your health care provider. Document Released: 05/13/2009 Document Revised: 02/04/2016 Document Reviewed: 09/10/2015 Elsevier Interactive Patient Education  2018 Elsevier Inc.  

## 2017-04-13 NOTE — Assessment & Plan Note (Signed)
Sharon is doing moderately well today.  She is not feeling great, so we will give her IV fluids and IV Zofran.  She is not due for treatment.  I reviewed with Dr. Lindi Adie, and he would like to hold off on continuing Eribulin during radiation therapy.  She will return after the radiation is completed.  She is doing better from a pain standpoint and I reinforced with her today regarding how to use her fentanyl patches.  It sounds like she is using them correctly.  She says her pain is under better control.

## 2017-04-16 ENCOUNTER — Encounter: Payer: Self-pay | Admitting: *Deleted

## 2017-04-16 ENCOUNTER — Telehealth: Payer: Self-pay | Admitting: *Deleted

## 2017-04-16 NOTE — Progress Notes (Signed)
Faxed patient letter of appt for 04/18/17-04/20/17 and appt schedule  For the rest of her radtxs to [facility] [phone] today 1:33 PM -

## 2017-04-16 NOTE — Telephone Encounter (Signed)
Called [facility], spoke with Threasa Beards, therapist,asked that they call the patient about her appt on 04/18/17, patient had left vm late Friday evening, that her scheduling needs to be addressed as she lives in [place] and [facility] is her transportation, and late times are difficult and she may not be able to get here from trasnportaion issues, Therapist to call the patient gave home and cell number 8:58 AM

## 2017-04-16 NOTE — Progress Notes (Addendum)
    [address], [place] [address]   Date: 04/16/17   Patient: Paula Navarro Date of visit: 04/20/17  To Whom It May Concern: Transport Service   [place]  k. Meas has an appointment( Radiation treatment  ) with Dr. Lisbeth Renshaw  in Radiation dept., at the [facility].  We cannot change her appointment time  At 5:00 pm on  04/20/17.  Please call our office should you have any questions/concerns. [phone]  Thank you,  Gaspar Garbe, RN,/ Shona Simpson, PA-C

## 2017-04-17 DIAGNOSIS — Z510 Encounter for antineoplastic radiation therapy: Secondary | ICD-10-CM | POA: Diagnosis not present

## 2017-04-18 ENCOUNTER — Ambulatory Visit
Admission: RE | Admit: 2017-04-18 | Discharge: 2017-04-18 | Disposition: A | Discharge status: Home / Self Care | Payer: Medicare Other | Source: Ambulatory Visit | Attending: Radiation Oncology | Admitting: Radiation Oncology

## 2017-04-18 VITALS — BP 166/76 | HR 88 | Resp 20

## 2017-04-18 DIAGNOSIS — Z510 Encounter for antineoplastic radiation therapy: Secondary | ICD-10-CM | POA: Diagnosis not present

## 2017-04-18 DIAGNOSIS — C770 Secondary and unspecified malignant neoplasm of lymph nodes of head, face and neck: Secondary | ICD-10-CM

## 2017-04-18 MED ORDER — LORAZEPAM 0.5 MG PO TABS
0.5000 mg | ORAL_TABLET | Freq: Once | ORAL | Status: AC
  Administered 2017-04-18: 0.5 mg via ORAL
  Filled 2017-04-18: qty 1

## 2017-04-18 NOTE — Progress Notes (Addendum)
Patient vomited around 100cc emesis, gave emesis bag, and verbal order to give 0.5mg  ativan sl x 1, pt gave name and dob as identification, patient is on Lianc#3 table, for rdaition to scalp and left chest tumors, [facility] is her transportation back home,  6:25 PM Patient threw up again in emesis bag, took vitals, called son Kennyth Lose [phone] asked how long before he could get here , 30 minutes stated, will stay with patient until he arrives,offered ginger ale, and patient in w/cm 7:04 PM BP (!) 166/76   Pulse 88   Resp 20   LMP 05/20/2011   7:06 PM

## 2017-04-19 ENCOUNTER — Telehealth: Payer: Self-pay | Admitting: Hematology and Oncology

## 2017-04-19 ENCOUNTER — Ambulatory Visit (HOSPITAL_BASED_OUTPATIENT_CLINIC_OR_DEPARTMENT_OTHER): Payer: Medicare Other | Admitting: Hematology and Oncology

## 2017-04-19 ENCOUNTER — Telehealth: Payer: Self-pay | Admitting: *Deleted

## 2017-04-19 ENCOUNTER — Ambulatory Visit
Admission: RE | Admit: 2017-04-19 | Discharge: 2017-04-19 | Disposition: A | Discharge status: Home / Self Care | Payer: Medicare Other | Source: Ambulatory Visit | Attending: Radiation Oncology | Admitting: Radiation Oncology

## 2017-04-19 ENCOUNTER — Encounter: Payer: Self-pay | Admitting: Hematology and Oncology

## 2017-04-19 DIAGNOSIS — C50011 Malignant neoplasm of nipple and areola, right female breast: Secondary | ICD-10-CM

## 2017-04-19 DIAGNOSIS — C7951 Secondary malignant neoplasm of bone: Secondary | ICD-10-CM

## 2017-04-19 DIAGNOSIS — C50012 Malignant neoplasm of nipple and areola, left female breast: Secondary | ICD-10-CM

## 2017-04-19 DIAGNOSIS — Z510 Encounter for antineoplastic radiation therapy: Secondary | ICD-10-CM | POA: Diagnosis not present

## 2017-04-19 DIAGNOSIS — R112 Nausea with vomiting, unspecified: Secondary | ICD-10-CM

## 2017-04-19 NOTE — Assessment & Plan Note (Signed)
Bilateral breast cancers are usually diagnosed 2015 treated with surgery, radiation, anastrozole Metastatic breast cancer diagnosed 05/28/2015 as subpectoral mass, ER 5%, PR 0%, HER-2 negative, failed Xeloda and carboplatin and gemcitabine  CT CAP04/17/2017:RP and retrocrural lymphadenopathy, large uterine fibroids with necrosis;  MRI Abd: RP lymphadenopathy CT chest 11/20/16: Worsening bulky conglomeration of mediastinal and right hilar lymph nodes, lung nodules subcentimeter size slight increase, left breast mass was 3.7 x 5.8 cm now 5.4 x 7.4 cm  Current treatment: 1. Halaven days 1 and 8 every 3 weeks completed 6 cycles (delayed due to surgery)  Chemotherapy toxicities: 1. Chemotherapy-induced peripheral neuropathy: Will closely monitor her symptoms. Her symptoms appear to be stable. She has grade 2 neuropathy 2. Fatigue 3. Alopecia    Scalp lesion: Going to be radiated. Chemotherapy will be on hold until radiation is completed. Return to clinic after radiation to start back on chemotherapy

## 2017-04-19 NOTE — Telephone Encounter (Signed)
No 9/20 los.

## 2017-04-19 NOTE — Telephone Encounter (Signed)
Spoke with patient, asked if she was better,"I'm not nauseated any more, ate grits this am and stayed down, will be in today for rad txs,thank you for taking good care of me yesterday",called and poke with [facility] RT therapist gave updtaed status 11:12 AM

## 2017-04-19 NOTE — Progress Notes (Signed)
Patient Care Team: [name], MD (Inactive) as PCP - General (Family Medicine) Rothbart, Cristopher Estimable, MD (Cardiology) [name], MD as Consulting Physician (Gastroenterology)  DIAGNOSIS:  Encounter Diagnosis  Name Primary?  . 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 N1 M0 stage IIB ER 100%, PR 0%, HER-2       06/17/2014 -  Radiation Therapy    Adjuvant radiation therapy      09/14/2014 -  Anti-estrogen oral therapy    Anastrozole 1 mg daily      05/28/2015 Imaging    CT scans: Enlarging subpectoral masses 3.1 x 3.5 cm, posteriorly lower density mass 4.5 x 2.1 cm, several right-sided lung nodules right lower lobe 1.4 cm, 3 other right lung nodules, 1.7 cm right iliac bone lesion      06/21/2015 Procedure    Left subpectoral mass biopsy: Invasive high-grade ductal carcinoma ER 5%, PR 0%, HER-2 negative ratio 1.29      09/01/2015 Imaging    Left chest wall mass increased in size 7.2 x 5.1 cm, multiple subcutaneous nodules, increase in the lung nodules both in number as well as in the size of existing nodules      09/10/2015 -  Chemotherapy    Carboplatin, gemcitabine days 1 and 8 q 3 weeks, carboplatin discontinued for neuropathy (treatment break from 01/20/2016 to 03/24/2016); Added Carboplatin back 04/14/16 (for progression)      01/26/2016 Imaging    Marked improvement in size of lung nodules with many of the nodules resolved index right lower lobe nodule 1.9 x 1.8 cm is now 0.7 x 0.6 cm, reduction in the size of left breast mass 7.2 cm down to 2.6 cm, enlargement in the hypodense mass in the uterus      04/06/2016 Imaging    CT chest: Interval progression of pre-existing lung nodules new lung metastases (37m, 114m 2.9 cm, 1.6 cm), interval progression of disease in the left breast 4.2 cm (was 2.6 cm), additional nodules 2.3 cm and 3.2 cm; CT head: scalp mass 1.9 cm      06/27/2016 Imaging    Ct chest: Stable lesions in lt breast deep to Left  pectoralis, cystic lesion loc ant in left breast inc compared to previous; decrease in lung nodules (54m to 3 mm; 12 mm to 7 mm, RML nodule 2.9 cm to 1.8 cm, RLL 130mto 7 mm)      11/14/2016 Imaging    CT CAP:RP and retrocrural lymphadenopathy, large uterine fibroids with necrosis; MRI Abd: RP lymphadenopathy CT chest 11/20/16: Worsening bulky conglomeration of mediastinal and right hilar lymph nodes, lung nodules subcentimeter size slight increase, left breast mass was 3.7 x 5.8 cm now 5.4 x 7.4 cm      12/08/2016 -  Chemotherapy    Palliative chemotherapy with Halaven days 1 and 8 every 3 weeks        Endometrial cancer ([place]  02/13/2017 Surgery    Hysterectomy with BSO: HG Serous carcinoma 7.7 cm (>50% Myometrial  inv)involving Uterus and Bilateral ovaries, Omentum: No carcinoma; T3a (FIGO stage IIIa)       CHIEF COMPLIANT: Currently on radiation therapy to the breast and the scalp lesions  INTERVAL HISTORY: Paula MICHELINs a [age]ear old with above-mentioned history of metastatic breast cancer who has a very large tumors in the pectoral area along with the tumor on the scalp. She is receiving palliative radiation therapy to both of these areas started yesterday. So far she has been doing quite well. She does not complain of any severe pain or discomfort. She had episode of nausea and vomiting yesterday.  REVIEW OF SYSTEMS:   Constitutional: Denies fevers, chills or abnormal weight loss Eyes: Denies blurriness of vision Ears, nose, mouth, throat, and face: Denies mucositis or sore throat,  lesion on the scalp Respiratory: Denies cough, dyspnea or wheezes Cardiovascular: Denies palpitation, chest discomfort Gastrointestinal:  Nausea and vomiting yesterday Skin: Denies abnormal skin rashes Lymphatics: Denies new lymphadenopathy or easy bruising Neurological:Denies numbness, tingling or new weaknesses Behavioral/Psych: Mood is stable, no new changes  Extremities: No lower extremity edema Breast:  Large subpectoral mass in the left chest wall All other systems were reviewed with the patient and are negative.  I have reviewed the past medical history, past surgical history, social history and family history with the patient and they are unchanged from previous note.  ALLERGIES:  is allergic to codeine and morphine and related.  MEDICATIONS:  Current Outpatient Prescriptions  Medication Sig Dispense Refill  . B Complex-C (SUPER B COMPLEX PO) Take 1 tablet by mouth daily.    . calcium-vitamin D (OSCAL-500) 500-400 MG-UNIT tablet Take 1 tablet by mouth 2 (two) times daily. (Patient taking differently: Take 1 tablet by mouth 2 (two) times daily. Pt takes once daily.) 60 tablet 3  . Clobetasol Prop  Emollient Base (CLOBETASOL PROPIONATE E) 0.05 % emollient cream Apply 1 application topically 2 (two) times daily. 30 g 0  . dicyclomine (BENTYL) 10 MG capsule Take 1 capsule (10 mg total) by mouth 3 (three) times daily as needed for spasms. 30 capsule 0  . DILAUDID 2 MG tablet Take 1 tablet (2 mg total) by mouth every 4 (four) hours as needed for severe pain. 90 tablet 0  . docusate sodium (COLACE) 100 MG capsule Take 1 capsule (100 mg total) by mouth 2 (two) times daily. 10 capsule 0  . fentaNYL (DURAGESIC - DOSED MCG/HR) 25 MCG/HR patch Place 1 patch (25 mcg total) onto the skin every 3 (three) days. 5 patch 0  . ferrous gluconate (FERGON) 324 MG tablet Take 1 tablet (324 mg total) by mouth 2 (two) times daily with a meal.  60 tablet 3  . gabapentin (NEURONTIN) 300 MG capsule TAKE 2 CAPSULES (600 MG TOTAL) BY MOUTH 3 (THREE) TIMES DAILY. 180 capsule 2  . ibuprofen (ADVIL,MOTRIN) 600 MG tablet Take 1 tablet (600 mg total) by mouth every 8 (eight) hours as needed (mild pain). 30 tablet 0  . lidocaine-prilocaine (EMLA) cream Apply to affected area once 30 g 3  . LORazepam (ATIVAN) 1 MG tablet TAKE 1 TABLET BY MOUTH EVERYDAY AT BEDTIME 30 tablet 0  . nystatin (MYCOSTATIN) 100000 UNIT/ML suspension Take 5 mLs (500,000 Units total) by mouth 4 (four) times daily. 60 mL 0  . polyethylene glycol (MIRALAX / GLYCOLAX) packet Take 17 g by mouth 2 (two) times daily. 14 each 0  . prochlorperazine (COMPAZINE) 10 MG tablet Take 1 tablet (10 mg total) by mouth every 6 (six) hours as needed (Nausea or vomiting). 30 tablet 1  . zolpidem (AMBIEN) 10 MG tablet Take 1 tablet (10 mg total) by mouth at bedtime. 30 tablet 2   No current facility-administered medications for this visit.    Facility-Administered Medications Ordered in Other Visits  Medication Dose Route Frequency Provider Last Rate Last Dose  . sodium chloride flush (NS) 0.9 % injection 10 mL  10 mL Intracatheter PRN Nicholas Lose, MD   10 mL at 03/16/17  1054    PHYSICAL EXAMINATION: ECOG PERFORMANCE STATUS: 1 - Symptomatic but completely ambulatory  Vitals:   04/19/17 1454  BP: 134/64  Pulse: 76  Resp: 18  Temp: 99.5 F (37.5 C)  SpO2: 100%   Filed Weights   04/19/17 1454  Weight: 110 lb 11.2 oz (50.2 kg)    GENERAL:alert, no distress and comfortable SKIN: skin color, texture, turgor are normal, no rashes or significant lesions EYES: normal, Conjunctiva are pink and non-injected, sclera clear OROPHARYNX:no exudate, no erythema and lips, buccal mucosa, and tongue normal  NECK: supple, thyroid normal size, non-tender, without nodularity LYMPH:  no palpable lymphadenopathy in the cervical, axillary or inguinal LUNGS: clear to auscultation and percussion with normal breathing effort HEART: regular rate & rhythm and no murmurs and no lower extremity edema ABDOMEN:abdomen soft, non-tender and normal bowel sounds MUSCULOSKELETAL:no cyanosis of digits and no clubbing  NEURO: alert & oriented x 3 with fluent speech, no focal motor/sensory deficits EXTREMITIES: No lower extremity edema  LABORATORY DATA:  I have reviewed the data as listed   Chemistry      Component Value Date/Time   NA 136 04/13/2017 1032   K 3.3 (L) 04/13/2017 1032   CL 100 (L) 02/14/2017 0612   CO2 29 04/13/2017 1032   BUN 9.9 04/13/2017 1032   CREATININE 0.7 04/13/2017 1032      Component Value Date/Time   CALCIUM 10.1 04/13/2017 1032   ALKPHOS 63 04/13/2017 1032   AST 52 (H) 04/13/2017 1032   ALT 26 04/13/2017 1032   BILITOT 0.84 04/13/2017 1032       Lab Results  Component Value Date   WBC 3.9 04/13/2017   HGB 9.3 (L) 04/13/2017   HCT 29.0 (L) 04/13/2017   MCV 66.0 (L) 04/13/2017   PLT 362 04/13/2017   NEUTROABS 0.6 (L) 04/13/2017    ASSESSMENT & PLAN:  Bilateral breast cancer ([place]) Bilateral breast cancers are usually diagnosed 2015 treated with surgery, radiation, anastrozole Metastatic breast cancer diagnosed 05/28/2015 as  subpectoral mass, ER 5%, PR 0%, HER-2 negative, failed Xeloda and carboplatin and gemcitabine  CT CAP04/17/2017:RP and retrocrural lymphadenopathy, large uterine fibroids with necrosis;  MRI Abd: RP  lymphadenopathy CT chest 11/20/16: Worsening bulky conglomeration of mediastinal and right hilar lymph nodes, lung nodules subcentimeter size slight increase, left breast mass was 3.7 x 5.8 cm now 5.4 x 7.4 cm  Current treatment: 1. Halaven days 1 and 8 every 3 weeks completed 6 cycles (delayed due to surgery)  Chemotherapy toxicities: 1. Chemotherapy-induced peripheral neuropathy: Will closely monitor her symptoms. Her symptoms appear to be stable. She has grade 2 neuropathy 2. Fatigue 3. Alopecia    Scalp lesion: Going to be radiated. Chemotherapy will be on hold until radiation is completed. Return to clinic after radiation to start back on chemotherapy   I spent 25 minutes talking to the patient of which more than half was spent in counseling and coordination of care.  No orders of the defined types were placed in this encounter.  The patient has a good understanding of the overall plan. she agrees with it. she will call with any problems that may develop before the next visit here.   Rulon Eisenmenger, MD 04/19/17

## 2017-04-20 ENCOUNTER — Ambulatory Visit
Admission: RE | Admit: 2017-04-20 | Discharge: 2017-04-20 | Disposition: A | Discharge status: Home / Self Care | Payer: Medicare Other | Source: Ambulatory Visit | Attending: Radiation Oncology | Admitting: Radiation Oncology

## 2017-04-20 DIAGNOSIS — Z510 Encounter for antineoplastic radiation therapy: Secondary | ICD-10-CM | POA: Diagnosis not present

## 2017-04-23 ENCOUNTER — Ambulatory Visit
Admission: RE | Admit: 2017-04-23 | Discharge: 2017-04-23 | Disposition: A | Discharge status: Home / Self Care | Payer: Medicare Other | Source: Ambulatory Visit | Attending: Radiation Oncology | Admitting: Radiation Oncology

## 2017-04-23 DIAGNOSIS — Z510 Encounter for antineoplastic radiation therapy: Secondary | ICD-10-CM | POA: Diagnosis not present

## 2017-04-24 ENCOUNTER — Ambulatory Visit
Admission: RE | Admit: 2017-04-24 | Discharge: 2017-04-24 | Disposition: A | Discharge status: Home / Self Care | Payer: Medicare Other | Source: Ambulatory Visit | Attending: Radiation Oncology | Admitting: Radiation Oncology

## 2017-04-24 DIAGNOSIS — Z510 Encounter for antineoplastic radiation therapy: Secondary | ICD-10-CM | POA: Diagnosis not present

## 2017-04-24 DIAGNOSIS — C770 Secondary and unspecified malignant neoplasm of lymph nodes of head, face and neck: Secondary | ICD-10-CM

## 2017-04-24 MED ORDER — RADIAPLEXRX EX GEL
Freq: Once | CUTANEOUS | Status: AC
  Administered 2017-04-24: 15:00:00 via TOPICAL

## 2017-04-24 NOTE — Progress Notes (Signed)
  Radiation Oncology         [phone] ________________________________  Name: Paula Navarro MRN: [id number]  Date: 04/13/2017  DOB: 04-29-1951  SIMULATION AND TREATMENT PLANNING NOTE  DIAGNOSIS:     ICD-10-CM   1. 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 Bone metastases ([place]) C79.51      Site:   1.  Left supraclavicular region 2.  scalp  NARRATIVE:  The patient was brought to the [facility].  Identity was confirmed.  All relevant records and images related to the planned course of therapy were reviewed.   Written consent to proceed with treatment was confirmed which was freely given after reviewing the details related to the planned course of therapy had been reviewed with the patient.  Then, the patient was set-up in a stable reproducible  supine position for radiation therapy.  CT images were obtained.  Surface markings were placed.    Medically necessary complex treatment device(s) for immobilization:  Customized thermoplastic head cast.   The CT images were loaded into the planning software.  Then the target and avoidance structures were contoured.  Treatment planning then occurred.  The radiation prescription was entered and confirmed.  A total of 8 complex treatment devices were fabricated which relate to the designed radiation treatment fields:  2 treatment fields to treat the scalp and 6 fields to treat the left supraclavicular region which has received prior radiation treatment. Each of these customized fields/ complex treatment devices will be used on a daily basis during the radiation course. I have requested : 3D Simulation  I have requested a DVH of the following structures: target volume, lungs, spinal cord, brachial plexus.   PLAN:  The patient will receive 30 Gy in 10 fractions.    Special treatment procedure The patient has received prior radiation treatment to the left upper chest region. The patient's current target area overlaps  with prior treatment and therefore this constitutes a course of reirradiation. This will require extra time intensive work for treatment planning purposes to account for this. This therefore constitutes a special treatment procedure  ________________________________   Jodelle Gross, MD, PhD

## 2017-04-25 ENCOUNTER — Ambulatory Visit
Admission: RE | Admit: 2017-04-25 | Discharge: 2017-04-25 | Disposition: A | Discharge status: Home / Self Care | Payer: Medicare Other | Source: Ambulatory Visit | Attending: Radiation Oncology | Admitting: Radiation Oncology

## 2017-04-25 DIAGNOSIS — Z510 Encounter for antineoplastic radiation therapy: Secondary | ICD-10-CM | POA: Diagnosis not present

## 2017-04-26 ENCOUNTER — Telehealth: Payer: Self-pay | Admitting: *Deleted

## 2017-04-26 ENCOUNTER — Ambulatory Visit
Admission: RE | Admit: 2017-04-26 | Discharge: 2017-04-26 | Disposition: A | Discharge status: Home / Self Care | Payer: Medicare Other | Source: Ambulatory Visit | Attending: Radiation Oncology | Admitting: Radiation Oncology

## 2017-04-26 ENCOUNTER — Other Ambulatory Visit: Payer: Self-pay | Admitting: Radiation Oncology

## 2017-04-26 DIAGNOSIS — C50919 Malignant neoplasm of unspecified site of unspecified female breast: Secondary | ICD-10-CM

## 2017-04-26 DIAGNOSIS — G8918 Other acute postprocedural pain: Secondary | ICD-10-CM

## 2017-04-26 DIAGNOSIS — Z510 Encounter for antineoplastic radiation therapy: Secondary | ICD-10-CM | POA: Diagnosis not present

## 2017-04-26 MED ORDER — DILAUDID 2 MG PO TABS: 2.0000 mg | ORAL_TABLET | ORAL | 0 refills | Status: DC | PRN

## 2017-04-26 MED FILL — HYDROmorphone HCL 2 MG TABS: 2 | 15 days supply | Qty: 90 | Fill #0

## 2017-04-26 NOTE — Telephone Encounter (Signed)
Returned call to patient, she stated she was in the shower and she started bleeding from her tumor on her breast called the paramedicas, they came and placed gause over site and she will be here to see MD Bryson Ha first before treatment, she will be coming around 110 am, 10:44 AM

## 2017-04-26 NOTE — Progress Notes (Signed)
The patient has been receiving radiotherapy to the left chest wall for recurrent metastatic breast cancer. She came in today for evaluation due to bleeding from her chest wall tumor. She reports that while showering this morning, she accidentally sloughed a scab off of her chest wall tumor and it began bleeding. She called EMS after saturating a bath towel with blood. A pressure dressing was placed and she is seen.   Her tumor was assessed and remains about 8 cm and along the smooth surface of the tumor she has a slowly continuously bleeding opening of the skin. After discussing her case with Dr. Lindi Adie and Dr. Lisbeth Renshaw, and after verbal consent from the patient, 4 silver nitrate sticks were used to achieve hemostasis. This was monitored and appeared to remain stable. She will proceed with XRT today. Below is post silver nitrate application. She was encouraged to keep a gauze roll between the base of the tumor and the upper portion of the left breast to avoid these surfaces touching.    I also spoke with Dr. Marlou Starks who recommends that if this recurs we could use surgicel from the OR, but that if her bleeding is significant, she should be seen in the ER and evaluated by general surgery.     Carola Rhine, PAC

## 2017-04-26 NOTE — Telephone Encounter (Signed)
Patient abd dressing removed, small clot and copious amount saturated blood red on dressing, scab area on left tumor draining red fluid/blood, per Shona Simpson, PA-C applied silver nitrate over open scab size penny, around edges open area, patient will have radiatin and will come back and wil apply aqua phor over scab  And 4x4 gause placed under fold of tumor,  Dr. Marlou Starks office called per PA and waiting on call back, if bleeding recurrs to cqll Surgeon 12:17 PM

## 2017-04-27 ENCOUNTER — Telehealth: Payer: Self-pay

## 2017-04-27 ENCOUNTER — Ambulatory Visit
Admission: RE | Admit: 2017-04-27 | Discharge: 2017-04-27 | Disposition: A | Discharge status: Home / Self Care | Payer: Medicare Other | Source: Ambulatory Visit | Attending: Radiation Oncology | Admitting: Radiation Oncology

## 2017-04-27 DIAGNOSIS — Z510 Encounter for antineoplastic radiation therapy: Secondary | ICD-10-CM | POA: Diagnosis not present

## 2017-04-27 NOTE — Telephone Encounter (Signed)
Pt called that she is not going to take any more radiation. It is making her weak where she can hardly walk into the cancer center. She states she did talk with Dr Lisbeth Renshaw, who wanted her to continue "but I'm not taking any more"  She will continue with chemo.

## 2017-04-28 ENCOUNTER — Emergency Department (HOSPITAL_COMMUNITY)
Admission: EM | Admit: 2017-04-28 | Discharge: 2017-04-28 | Disposition: A | Discharge status: Home / Self Care | Payer: Medicare Other | Attending: Emergency Medicine | Admitting: Emergency Medicine

## 2017-04-28 ENCOUNTER — Encounter (HOSPITAL_COMMUNITY): Payer: Self-pay | Admitting: *Deleted

## 2017-04-28 DIAGNOSIS — Y849 Medical procedure, unspecified as the cause of abnormal reaction of the patient, or of later complication, without mention of misadventure at the time of the procedure: Secondary | ICD-10-CM | POA: Insufficient documentation

## 2017-04-28 DIAGNOSIS — Y999 Unspecified external cause status: Secondary | ICD-10-CM | POA: Insufficient documentation

## 2017-04-28 DIAGNOSIS — F17210 Nicotine dependence, cigarettes, uncomplicated: Secondary | ICD-10-CM | POA: Insufficient documentation

## 2017-04-28 DIAGNOSIS — Y929 Unspecified place or not applicable: Secondary | ICD-10-CM | POA: Diagnosis not present

## 2017-04-28 DIAGNOSIS — S21002A Unspecified open wound of left breast, initial encounter: Secondary | ICD-10-CM | POA: Diagnosis present

## 2017-04-28 DIAGNOSIS — X58XXXA Exposure to other specified factors, initial encounter: Secondary | ICD-10-CM | POA: Diagnosis not present

## 2017-04-28 DIAGNOSIS — Z79899 Other long term (current) drug therapy: Secondary | ICD-10-CM | POA: Diagnosis not present

## 2017-04-28 DIAGNOSIS — Z510 Encounter for antineoplastic radiation therapy: Secondary | ICD-10-CM | POA: Insufficient documentation

## 2017-04-28 DIAGNOSIS — J449 Chronic obstructive pulmonary disease, unspecified: Secondary | ICD-10-CM | POA: Insufficient documentation

## 2017-04-28 DIAGNOSIS — C44501 Unspecified malignant neoplasm of skin of breast: Secondary | ICD-10-CM | POA: Insufficient documentation

## 2017-04-28 DIAGNOSIS — C419 Malignant neoplasm of bone and articular cartilage, unspecified: Secondary | ICD-10-CM | POA: Diagnosis not present

## 2017-04-28 DIAGNOSIS — T148XXA Other injury of unspecified body region, initial encounter: Secondary | ICD-10-CM

## 2017-04-28 DIAGNOSIS — Y939 Activity, unspecified: Secondary | ICD-10-CM | POA: Diagnosis not present

## 2017-04-28 DIAGNOSIS — C7800 Secondary malignant neoplasm of unspecified lung: Secondary | ICD-10-CM | POA: Diagnosis not present

## 2017-04-28 MED ORDER — SILVER NITRATE-POT NITRATE 75-25 % EX MISC
1.0000 "application " | Freq: Once | CUTANEOUS | Status: AC
  Administered 2017-04-28: 1 via TOPICAL
  Filled 2017-04-28: qty 1

## 2017-04-28 MED ORDER — HYDROMORPHONE HCL 2 MG PO TABS
2.0000 mg | ORAL_TABLET | Freq: Once | ORAL | Status: AC
  Administered 2017-04-28: 2 mg via ORAL
  Filled 2017-04-28: qty 1

## 2017-04-28 NOTE — ED Notes (Signed)
Called pt's son. Son states "I will be on the way in a bit".

## 2017-04-28 NOTE — Discharge Instructions (Signed)
Try to keep the dressing in place. Recheck as needed.

## 2017-04-28 NOTE — ED Notes (Signed)
Pt given warm blankets per request,

## 2017-04-28 NOTE — ED Triage Notes (Signed)
Pt c/o bleeding from tumor located on left breast that woke her up from sleep, bandage applied, pt also c/o pain to left arm, has swelling noted, pt states 'I have lymphedema in that arm and the swelling is normal"

## 2017-04-28 NOTE — ED Provider Notes (Signed)
[facility] DEPT Provider Note   CSN: [id number] Arrival date & time: 04/28/17  0609  Time seen 06:30 AM   History   Chief Complaint Chief Complaint  Patient presents with  . Coagulation Disorder    HPI Paula Navarro is a 66 y.o. female.  HPI  Patient states she was diagnosed with breast cancer in 2014 however she had recurrence happen about 2 years ago. She has a large mass in her superior left upper breast that she is getting radiation treatment for. She states she skipped this week, however she's going to restart on Monday, October 1. She states when she finishes radiation they're going to do chemotherapy.She's been having issues with the mass having bleeding. It has been cauterized once before. She states she woke up at 5 AM and there was blood all over her bed. She states she's bleeding from that mass.she states she was told before they couldn't get the bleeding stopped she might have to see the surgeon to have electrocautery done.  PCP [name], MD (Inactive) Oncology Dr Lindi Adie  Past Medical History:  Diagnosis Date  . Anemia   . Anxiety   . Atrial septal defect 1996   Surgical repair in 1996  . Breast cancer ([place]) dx'd 06/2013  . Chest pain    Admitted to [facility] in 09/2011; refused stress test  . Chronic bronchitis   . Chronic pain   . COPD (chronic obstructive pulmonary disease) ([place])    on xray  . Metastatic cancer to lung ([place]) dx'd 2017  . Palpitation    Tachycardia reported by monitor clerk during a symptomatic spell  . Radiation 06/30/14-08/17/14   Bilateral Breast  . Tobacco abuse    60 pack years; 1.5 packs per day  . Wears dentures    top    Patient Active Problem List   Diagnosis Date Noted  . Endometrial cancer ([place]) 12/18/2016  . Protein-calorie malnutrition, severe 11/15/2016  . UTI (urinary tract infection) 11/14/2016  . COPD (chronic obstructive pulmonary disease) ([place])   . 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  Chemotherapy-induced thrombocytopenia 06/13/2016  . Scalp lesion 04/01/2016  . Encounter for central line care 12/08/2015  . Port catheter in place 11/19/2015  . Insomnia 11/01/2015  . Microcytic anemia 09/28/2015  . Lung metastases ([place]) 09/10/2015  . Bone metastases ([place]) 07/20/2015  . Goals of care, counseling/discussion 07/13/2015  . Metastatic breast cancer ([place]) 06/29/2015  . Chronic pain 03/16/2015  . Vaginal bleeding 09/24/2014  . Postherpetic neuralgia 09/03/2014  . Lymphedema 09/03/2014  . Suspected herpes zoster left C5 distribution 08/14/2014  . 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 Anxiety 03/03/2014  . Bilateral breast cancer ([place]) 08/11/2013  . Palpitation   . Chest pain   . Atrial septal defect   . Laboratory test 11/25/2011  . Chronic bronchitis   . Tobacco abuse     Past Surgical History:  Procedure Laterality Date  . ASD REPAIR, OSTIUM PRIMUM  1996   dr Roxy Horseman  . AXILLARY LYMPH NODE DISSECTION Bilateral 04/08/2014   Procedure:  BILATERAL AXILLARY LYMPH NODE DISSECTION;  Surgeon: Autumn Messing III, MD;  Location: [facility];  Service: General;  Laterality: Bilateral;  . BREAST BIOPSY Bilateral   . BREAST LUMPECTOMY WITH RADIOACTIVE SEED LOCALIZATION Bilateral 04/08/2014   Procedure: BILATERAL  RADIOACTIVE SEED LOCALIZATION LUMPECTOMY ;  Surgeon: Autumn Messing III, MD;  Location: [facility];  Service: General;  Laterality: Bilateral;  .  CESAREAN SECTION     x3  . CHOLECYSTECTOMY    . LAPAROTOMY N/A 02/13/2017   Procedure: MINI EXPLORATORY LAPAROTOMY;  Surgeon: Everitt Amber, MD;  [facility];  Service: Gynecology;  Laterality: N/A;  . open heart surgery    . PORT A CATH REVISION  1/15   put in   . ROBOTIC ASSISTED TOTAL HYSTERECTOMY WITH BILATERAL SALPINGO OOPHERECTOMY Bilateral 02/13/2017   Procedure: XI ROBOTIC ASSISTED TOTAL HYSTERECTOMY WITH BILATERAL SALPINGO OOPHORECTOMY WITH LYSIS OF ADHESIONS, UTERUS  GREATER THAN 250GRAMS;  Surgeon: Everitt Amber, MD;  [facility];  Service: Gynecology;  Laterality: Bilateral;  . TUBAL LIGATION      OB History    Gravida Para Term Preterm AB Living   4 3 3          SAB TAB Ectopic Multiple Live Births                   Home Medications    Prior to Admission medications   Medication Sig Start Date End Date Taking? Authorizing Provider  B Complex-C (SUPER B COMPLEX PO) Take 1 tablet by mouth daily.   Yes [provider]  calcium-vitamin D (OSCAL-500) 500-400 MG-UNIT tablet Take 1 tablet by mouth 2 (two) times daily. Patient taking differently: Take 1 tablet by mouth 2 (two) times daily. Pt takes once daily. 08/18/15  Yes Nicholas Lose, MD  Clobetasol Prop Emollient Base (CLOBETASOL PROPIONATE E) 0.05 % emollient cream Apply 1 application topically 2 (two) times daily. 01/10/17  Yes Nicholas Lose, MD  dicyclomine (BENTYL) 10 MG capsule Take 1 capsule (10 mg total) by mouth 3 (three) times daily as needed for spasms. 03/21/17  Yes Nicholas Lose, MD  DILAUDID 2 MG tablet Take 1 tablet (2 mg total) by mouth every 4 (four) hours as needed for severe pain. 04/26/17  Yes Hayden Pedro, PA-C  docusate sodium (COLACE) 100 MG capsule Take 1 capsule (100 mg total) by mouth 2 (two) times daily. 04/06/17  Yes Causey, Charlestine Massed, NP  fentaNYL (DURAGESIC - DOSED MCG/HR) 25 MCG/HR patch Place 1 patch (25 mcg total) onto the skin every 3 (three) days. 04/06/17  Yes Causey, Charlestine Massed, NP  ferrous gluconate (FERGON) 324 MG tablet Take 1 tablet (324 mg total) by mouth 2 (two) times daily with a meal. 02/14/17  Yes Everitt Amber, MD  gabapentin (NEURONTIN) 300 MG capsule TAKE 2 CAPSULES (600 MG TOTAL) BY MOUTH 3 (THREE) TIMES DAILY. 02/28/17  Yes Nicholas Lose, MD  hyaluronate sodium (RADIAPLEXRX) GEL Apply 1 application topically 2 (two) times daily. Apply toleft breast tumor after rad tx and bedtime daily 04/24/17  Yes Hayden Pedro, PA-C    lidocaine-prilocaine (EMLA) cream Apply to affected area once 11/27/16  Yes Nicholas Lose, MD  LORazepam (ATIVAN) 1 MG tablet TAKE 1 TABLET BY MOUTH EVERYDAY AT BEDTIME 04/12/17  Yes Nicholas Lose, MD  nystatin (MYCOSTATIN) 100000 UNIT/ML suspension Take 5 mLs (500,000 Units total) by mouth 4 (four) times daily. 01/26/17  Yes Causey, Charlestine Massed, NP  polyethylene glycol (MIRALAX / GLYCOLAX) packet Take 17 g by mouth 2 (two) times daily. 04/06/17  Yes Causey, Charlestine Massed, NP  prochlorperazine (COMPAZINE) 10 MG tablet Take 1 tablet (10 mg total) by mouth every 6 (six) hours as needed (Nausea or vomiting). 03/30/17  Yes Causey, Charlestine Massed, NP  zolpidem (AMBIEN) 10 MG tablet Take 1 tablet (10 mg total) by mouth at bedtime. 04/06/17  Yes Causey, Charlestine Massed, NP  ibuprofen (  ADVIL,MOTRIN) 600 MG tablet Take 1 tablet (600 mg total) by mouth every 8 (eight) hours as needed (mild pain). 02/14/17   Everitt Amber, MD    Family History Family History  Problem Relation Age of Onset  . Breast cancer Maternal Aunt     Social History Social History  Substance Use Topics  . Smoking status: Former Smoker    Packs/day: 1.50    Years: 40.00    Types: Cigarettes    Quit date: 08/01/2012  . Smokeless tobacco: Never Used  . Alcohol use No     Allergies   Codeine and Morphine and related   Review of Systems Review of Systems  All other systems reviewed and are negative.    Physical Exam Updated Vital Signs BP 116/84 (BP Location: Right Arm)   Pulse 94   Temp 98.5 F (36.9 C) (Oral)   Resp 18   Ht 5\' 2"  (1.575 m)   Wt 49.9 kg (110 lb)   LMP 05/20/2011   SpO2 96%   BMI 20.12 kg/m   Vital signs normal    Physical Exam  Constitutional: She is oriented to person, place, and time. She appears well-developed and well-nourished. No distress.  HENT:  Head: Normocephalic and atraumatic.  Right Ear: External ear normal.  Eyes: Conjunctivae and EOM are normal.  Neck: Normal range of  motion.  Cardiovascular: Normal rate.   Pulmonary/Chest: Effort normal. No respiratory distress.  Patient has a large mass in her left upper breast area that almost looks like a third breast. There is an area that has a slow trickle of active bleeding. Her gown is soaked with blood in the left chest area and  the dressing that was placed on it.  Musculoskeletal: Normal range of motion.  Neurological: She is alert and oriented to person, place, and time. No cranial nerve deficit.  Skin: Skin is warm and dry.  Psychiatric: She has a normal mood and affect. Her behavior is normal. Thought content normal.  Nursing note and vitals reviewed.    ED Treatments / Results  Labs (all labs ordered are listed, but only abnormal results are displayed) Labs Reviewed - No data to display  EKG  EKG Interpretation None       Radiology No results found.  Procedures Cauterization Date/Time: 04/28/2017 6:49 AM Performed by: Tomi Bamberger, Kloe Oates Authorized by: Rolland Porter  Consent: Verbal consent obtained. Written consent not obtained. Consent given by: patient Patient understanding: patient states understanding of the procedure being performed Patient consent: the patient's understanding of the procedure matches consent given Patient identity confirmed: verbally with patient, arm band and hospital-assigned identification number Time out: Immediately prior to procedure a "time out" was called to verify the correct patient, procedure, equipment, support staff and site/side marked as required. Local anesthesia used: no  Anesthesia: Local anesthesia used: no  Sedation: Patient sedated: no Comments: Sever nitrate was placed to the area that was bleeding. There is a small hole in the skin. Silver nitrate stick 2 was used. The bleeding resolved. I then placed a strip of Surgicel over the area. There was no active bleeding seen. 4 x 4's were placed over the area.    (including critical care  time)   Medications Ordered in ED Medications  silver nitrate applicators applicator 1 application (not administered)  HYDROmorphone (DILAUDID) tablet 2 mg (not administered)     Initial Impression / Assessment and Plan / ED Course  I have reviewed the triage vital signs and the nursing  notes.  Pertinent labs & imaging results that were available during my care of the patient were reviewed by me and considered in my medical decision making (see chart for details).     Patient states she normally takes Dilaudid in the morning. She was given oral Dilaudid.  Final Clinical Impressions(s) / ED Diagnoses   Final diagnoses:  Bleeding from wound    Plan discharge  Rolland Porter, MD, Barbette Or, MD 04/28/17 [phone]

## 2017-04-28 NOTE — ED Notes (Signed)
Called patient's emergency contacts with no answer. Left messages. Pt requesting to go by EMS.

## 2017-04-29 ENCOUNTER — Encounter (HOSPITAL_COMMUNITY): Payer: Self-pay | Admitting: *Deleted

## 2017-04-29 ENCOUNTER — Emergency Department (HOSPITAL_COMMUNITY)
Admission: EM | Admit: 2017-04-29 | Discharge: 2017-04-29 | Disposition: A | Discharge status: Home / Self Care | Payer: Medicare Other | Attending: Emergency Medicine | Admitting: Emergency Medicine

## 2017-04-29 DIAGNOSIS — J449 Chronic obstructive pulmonary disease, unspecified: Secondary | ICD-10-CM | POA: Diagnosis not present

## 2017-04-29 DIAGNOSIS — T148XXD Other injury of unspecified body region, subsequent encounter: Secondary | ICD-10-CM | POA: Insufficient documentation

## 2017-04-29 DIAGNOSIS — T148XXA Other injury of unspecified body region, initial encounter: Secondary | ICD-10-CM

## 2017-04-29 DIAGNOSIS — Y839 Surgical procedure, unspecified as the cause of abnormal reaction of the patient, or of later complication, without mention of misadventure at the time of the procedure: Secondary | ICD-10-CM | POA: Insufficient documentation

## 2017-04-29 DIAGNOSIS — Z85118 Personal history of other malignant neoplasm of bronchus and lung: Secondary | ICD-10-CM | POA: Insufficient documentation

## 2017-04-29 DIAGNOSIS — Z87891 Personal history of nicotine dependence: Secondary | ICD-10-CM | POA: Insufficient documentation

## 2017-04-29 DIAGNOSIS — Z853 Personal history of malignant neoplasm of breast: Secondary | ICD-10-CM | POA: Insufficient documentation

## 2017-04-29 DIAGNOSIS — Z4801 Encounter for change or removal of surgical wound dressing: Secondary | ICD-10-CM | POA: Diagnosis present

## 2017-04-29 NOTE — ED Notes (Signed)
Pt alert & oriented x4, stable gait. Patient given discharge instructions, paperwork & prescription(s). Patient verbalized understanding. Pt left department in wheelchair escorted by staff. Pt left w/ no further questions. 

## 2017-04-29 NOTE — ED Triage Notes (Signed)
Pt c/o continued bleeding from tumor on left breast area, pt has bandage in place that was put on area at 2am with dried blood noted,  Bandage removed in triage without any new bleeding noted,

## 2017-04-29 NOTE — ED Provider Notes (Signed)
[facility] DEPT Provider Note   CSN: [id number] Arrival date & time: 04/29/17  1936     History   Chief Complaint Chief Complaint  Patient presents with  . Wound Check    HPI Paula Navarro is a 66 y.o. female.   Patient is a 66 year old female who presents to the emergency department with bleeding from an area of the left breast.  The patient has a large mass that is been diagnosed as cancer is of the left breast. She was seen on yesterday because of bleeding. The area that was bleeding was cauterized and dressed. The patient states that tonight she started having bleeding again. She denies fever or chills. She states the pain is about the same. She's not had any new trauma to her chest. Patient is currently receiving radiation.      Past Medical History:  Diagnosis Date  . Anemia   . Anxiety   . Atrial septal defect 1996   Surgical repair in 1996  . Breast cancer ([place]) dx'd 06/2013  . Chest pain    Admitted to [facility] in 09/2011; refused stress test  . Chronic bronchitis   . Chronic pain   . COPD (chronic obstructive pulmonary disease) ([place])    on xray  . Metastatic cancer to lung ([place]) dx'd 2017  . Palpitation    Tachycardia reported by monitor clerk during a symptomatic spell  . Radiation 06/30/14-08/17/14   Bilateral Breast  . Tobacco abuse    60 pack years; 1.5 packs per day  . Wears dentures    top    Patient Active Problem List   Diagnosis Date Noted  . Endometrial cancer ([place]) 12/18/2016  . Protein-calorie malnutrition, severe 11/15/2016  . UTI (urinary tract infection) 11/14/2016  . COPD (chronic obstructive pulmonary disease) ([place])   . 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 Chemotherapy-induced thrombocytopenia 06/13/2016  . Scalp lesion 04/01/2016  . Encounter for central line care 12/08/2015  . Port catheter in place 11/19/2015  . Insomnia 11/01/2015  . Microcytic anemia 09/28/2015  . Lung metastases ([place]) 09/10/2015  .  Bone metastases ([place]) 07/20/2015  . Goals of care, counseling/discussion 07/13/2015  . Metastatic breast cancer ([place]) 06/29/2015  . Chronic pain 03/16/2015  . Vaginal bleeding 09/24/2014  . Postherpetic neuralgia 09/03/2014  . Lymphedema 09/03/2014  . Suspected herpes zoster left C5 distribution 08/14/2014  . 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 Anxiety 03/03/2014  . Bilateral breast cancer ([place]) 08/11/2013  . Palpitation   . Chest pain   . Atrial septal defect   . Laboratory test 11/25/2011  . Chronic bronchitis   . Tobacco abuse     Past Surgical History:  Procedure Laterality Date  . ASD REPAIR, OSTIUM PRIMUM  1996   dr Roxy Horseman  . AXILLARY LYMPH NODE DISSECTION Bilateral 04/08/2014   Procedure:  BILATERAL AXILLARY LYMPH NODE DISSECTION;  Surgeon: Autumn Messing III, MD;  Location: [facility];  Service: General;  Laterality: Bilateral;  . BREAST BIOPSY Bilateral   . BREAST LUMPECTOMY WITH RADIOACTIVE SEED LOCALIZATION Bilateral 04/08/2014   Procedure: BILATERAL  RADIOACTIVE SEED LOCALIZATION LUMPECTOMY ;  Surgeon: Autumn Messing III, MD;  Location: [facility];  Service: General;  Laterality: Bilateral;  . CESAREAN SECTION     x3  . CHOLECYSTECTOMY    . LAPAROTOMY N/A 02/13/2017   Procedure: MINI EXPLORATORY LAPAROTOMY;  Surgeon: Everitt Amber, MD;  [facility];  Service: Gynecology;  Laterality: N/A;  .  open heart surgery    . PORT A CATH REVISION  1/15   put in   . ROBOTIC ASSISTED TOTAL HYSTERECTOMY WITH BILATERAL SALPINGO OOPHERECTOMY Bilateral 02/13/2017   Procedure: XI ROBOTIC ASSISTED TOTAL HYSTERECTOMY WITH BILATERAL SALPINGO OOPHORECTOMY WITH LYSIS OF ADHESIONS, UTERUS GREATER THAN 250GRAMS;  Surgeon: Everitt Amber, MD;  [facility];  Service: Gynecology;  Laterality: Bilateral;  . TUBAL LIGATION      OB History    Gravida Para Term Preterm AB Living   4 3 3          SAB TAB Ectopic Multiple Live Births                    Home Medications    Prior to Admission medications   Medication Sig Start Date End Date Taking? Authorizing Provider  B Complex-C (SUPER B COMPLEX PO) Take 1 tablet by mouth daily.    [provider]  calcium-vitamin D (OSCAL-500) 500-400 MG-UNIT tablet Take 1 tablet by mouth 2 (two) times daily. Patient taking differently: Take 1 tablet by mouth 2 (two) times daily. Pt takes once daily. 08/18/15   Nicholas Lose, MD  Clobetasol Prop Emollient Base (CLOBETASOL PROPIONATE E) 0.05 % emollient cream Apply 1 application topically 2 (two) times daily. 01/10/17   Nicholas Lose, MD  dicyclomine (BENTYL) 10 MG capsule Take 1 capsule (10 mg total) by mouth 3 (three) times daily as needed for spasms. 03/21/17   Nicholas Lose, MD  DILAUDID 2 MG tablet Take 1 tablet (2 mg total) by mouth every 4 (four) hours as needed for severe pain. 04/26/17   Hayden Pedro, PA-C  docusate sodium (COLACE) 100 MG capsule Take 1 capsule (100 mg total) by mouth 2 (two) times daily. 04/06/17   Gardenia Phlegm, NP  fentaNYL (DURAGESIC - DOSED MCG/HR) 25 MCG/HR patch Place 1 patch (25 mcg total) onto the skin every 3 (three) days. 04/06/17   Gardenia Phlegm, NP  ferrous gluconate (FERGON) 324 MG tablet Take 1 tablet (324 mg total) by mouth 2 (two) times daily with a meal. 02/14/17   Everitt Amber, MD  gabapentin (NEURONTIN) 300 MG capsule TAKE 2 CAPSULES (600 MG TOTAL) BY MOUTH 3 (THREE) TIMES DAILY. 02/28/17   Nicholas Lose, MD  hyaluronate sodium (RADIAPLEXRX) GEL Apply 1 application topically 2 (two) times daily. Apply toleft breast tumor after rad tx and bedtime daily 04/24/17   Hayden Pedro, PA-C  ibuprofen (ADVIL,MOTRIN) 600 MG tablet Take 1 tablet (600 mg total) by mouth every 8 (eight) hours as needed (mild pain). 02/14/17   Everitt Amber, MD  lidocaine-prilocaine (EMLA) cream Apply to affected area once 11/27/16   Nicholas Lose, MD  LORazepam (ATIVAN) 1 MG tablet TAKE 1 TABLET BY MOUTH  EVERYDAY AT BEDTIME 04/12/17   Nicholas Lose, MD  nystatin (MYCOSTATIN) 100000 UNIT/ML suspension Take 5 mLs (500,000 Units total) by mouth 4 (four) times daily. 01/26/17   Causey, Charlestine Massed, NP  polyethylene glycol (MIRALAX / GLYCOLAX) packet Take 17 g by mouth 2 (two) times daily. 04/06/17   Gardenia Phlegm, NP  prochlorperazine (COMPAZINE) 10 MG tablet Take 1 tablet (10 mg total) by mouth every 6 (six) hours as needed (Nausea or vomiting). 03/30/17   Gardenia Phlegm, NP  zolpidem (AMBIEN) 10 MG tablet Take 1 tablet (10 mg total) by mouth at bedtime. 04/06/17   Gardenia Phlegm, NP    Family History Family History  Problem Relation Age of Onset  .  Breast cancer Maternal Aunt     Social History Social History  Substance Use Topics  . Smoking status: Former Smoker    Packs/day: 1.50    Years: 40.00    Types: Cigarettes    Quit date: 08/01/2012  . Smokeless tobacco: Never Used  . Alcohol use No     Allergies   Codeine and Morphine and related   Review of Systems Review of Systems  Constitutional: Positive for fatigue. Negative for activity change.       All ROS Neg except as noted in HPI  HENT: Negative for nosebleeds.   Eyes: Negative for photophobia and discharge.  Respiratory: Negative for cough, shortness of breath and wheezing.        Chest wall pain.  Cardiovascular: Negative for chest pain and palpitations.  Gastrointestinal: Negative for abdominal pain and blood in stool.  Endocrine: Positive for cold intolerance.  Genitourinary: Negative for dysuria, frequency and hematuria.  Musculoskeletal: Negative for arthralgias, back pain and neck pain.  Skin: Positive for wound.  Neurological: Negative for dizziness, seizures and speech difficulty.  Psychiatric/Behavioral: Negative for confusion and hallucinations.     Physical Exam Updated Vital Signs BP 119/67   Pulse 81   Temp 98.9 F (37.2 C) (Oral)   Resp 16   Ht 5\' 2"  (1.575 m)   Wt  49.9 kg (110 lb)   LMP 05/20/2011   SpO2 100%   BMI 20.12 kg/m   Physical Exam  Constitutional: She is oriented to person, place, and time. She appears well-developed and well-nourished.  Non-toxic appearance.  HENT:  Head: Normocephalic.  Right Ear: Tympanic membrane and external ear normal.  Left Ear: Tympanic membrane and external ear normal.  Eyes: Pupils are equal, round, and reactive to light. EOM and lids are normal.  Neck: Normal range of motion. Neck supple. Carotid bruit is not present.  Cardiovascular: Normal rate, regular rhythm, normal heart sounds, intact distal pulses and normal pulses.   Pulmonary/Chest: Breath sounds normal. No respiratory distress.  Chaperone present during the examination. The patient has a large mass extending from the tail to the mid left breast. The nipple area does not seem to be involved. There is no drainage from the nipple area. The mass of the left breast has 3 indurated areas. They are warm but not hot. One of them has a skin tear. And underneath the skin tear is a small hole with bloody drainage. The mass is warm to touch, but not hot. There is no red streaking appreciated at this time. The mass is sore to touch.  Abdominal: Soft. Bowel sounds are normal. There is no tenderness. There is no guarding.  Musculoskeletal: Normal range of motion.  Lymphadenopathy:       Head (right side): No submandibular adenopathy present.       Head (left side): No submandibular adenopathy present.    She has no cervical adenopathy.  Neurological: She is alert and oriented to person, place, and time. She has normal strength. No cranial nerve deficit or sensory deficit.  Skin: Skin is warm and dry.  Psychiatric: She has a normal mood and affect. Her speech is normal.  Nursing note and vitals reviewed.    ED Treatments / Results  Labs (all labs ordered are listed, but only abnormal results are displayed) Labs Reviewed - No data to display  EKG  EKG  Interpretation None       Radiology No results found.  Procedures Procedures (including critical care time) WOUND  Medications Ordered in ED Medications - No data to display   Initial Impression / Assessment and Plan / ED Course  I have reviewed the triage vital signs and the nursing notes.  Pertinent labs & imaging results that were available during my care of the patient were reviewed by me and considered in my medical decision making (see chart for details).    Pt seen with me by Dr Ashok Cordia.   Final Clinical Impressions(s) / ED Diagnoses Vital signs stable. Pt awake and alert. No distress noted. Pt has a tumor of the upper left breast. She has 3 indurated areas present. One of these has a small hole with drainage.  Pt treated yesterday and returns today for the same. Bleeding/drainage improved with Surgicel Hemostat. Pt observed. No additional bleeding. Pt to see radiation specialist tomorrow. Pt encouraged to have [place] To check the wound and consider antibiotics/surgical intervention. Pt in agreement with plan.   Final diagnoses:  Bleeding from wound    New Prescriptions New Prescriptions   No medications on file     Lily Kocher, Hershal Coria 04/30/17 1614    Lily Kocher, PA-C 04/30/17 1614    Lajean Saver, MD 05/01/17 1201

## 2017-04-29 NOTE — Discharge Instructions (Signed)
Your wound was treated with Surgicel and dressing. Please have the physicians at the cancer center evaluate the wound for bleeding and possible abscess present. See MD at the [facility] Emergency Dept or return to this Emergency Dept if any changes or problem.

## 2017-04-30 ENCOUNTER — Telehealth: Payer: Self-pay | Admitting: Hematology and Oncology

## 2017-04-30 ENCOUNTER — Other Ambulatory Visit: Payer: Self-pay

## 2017-04-30 ENCOUNTER — Ambulatory Visit: Payer: Medicare Other

## 2017-04-30 ENCOUNTER — Telehealth: Payer: Self-pay | Admitting: *Deleted

## 2017-04-30 ENCOUNTER — Ambulatory Visit: Admission: RE | Admit: 2017-04-30 | Payer: Medicare Other | Source: Ambulatory Visit

## 2017-04-30 DIAGNOSIS — R1030 Lower abdominal pain, unspecified: Secondary | ICD-10-CM

## 2017-04-30 MED ORDER — DICYCLOMINE HCL 10 MG PO CAPS: 10.0000 mg | ORAL_CAPSULE | Freq: Three times a day (TID) | ORAL | 0 refills | Status: DC | PRN

## 2017-04-30 NOTE — Telephone Encounter (Signed)
04/28/17 prescription refill

## 2017-04-30 NOTE — Telephone Encounter (Signed)
Called and left voice message on answering machine after  Receiving  In basket note from med/onc that patient has stopped radiation, asked her to give [place] a call,Dr. Lisbeth Renshaw is off today and will route this to [employer] PA-C 10:00 AM

## 2017-04-30 NOTE — Telephone Encounter (Signed)
Will notify of Dr.Gudena.

## 2017-05-01 ENCOUNTER — Telehealth: Payer: Self-pay | Admitting: *Deleted

## 2017-05-01 ENCOUNTER — Encounter: Payer: Self-pay | Admitting: *Deleted

## 2017-05-01 ENCOUNTER — Ambulatory Visit: Admission: RE | Admit: 2017-05-01 | Payer: Medicare Other | Source: Ambulatory Visit

## 2017-05-01 NOTE — Telephone Encounter (Signed)
Called and left vm for patient to go see Dr. Marlou Starks today at 1045 am today, can call his office for questions,concerns [phone] 8:37 AM

## 2017-05-01 NOTE — Progress Notes (Addendum)
Paula Navarro came to radiation dept, and stated she needed a letter faxed to [employer] ,she couldn't get to Dr. Marlou Starks office today, , her appt with Dr. Marlou Starks is THursday May 03, 2017 at 2:15pm, she is refusing ro have any radiation until after seeing Dr. Marlou Starks, she will call me after his visit to let us know if she will come Thursday afternoon for treatment depending on what Dr. Marlou Starks says. Faxed letter to [employer]  To [phone] at 1:15pm today confirmed sent 1:39 PM

## 2017-05-01 NOTE — Telephone Encounter (Signed)
Called second time, this message left on her cell phone, earlier message left on her home phone to call Dr. Marlou Starks office [phone] if cannot make 10:45 am appt today to be seen 9:08 AM'

## 2017-05-02 ENCOUNTER — Telehealth: Payer: Self-pay | Admitting: Hematology and Oncology

## 2017-05-02 ENCOUNTER — Ambulatory Visit: Payer: Medicare Other

## 2017-05-02 NOTE — Telephone Encounter (Signed)
Received Response Requested for new  medication on 05/01/17 from [facility]. A copy of this is located in [facility] under Media tab for viewing and printing.

## 2017-05-03 ENCOUNTER — Ambulatory Visit: Payer: Medicare Other

## 2017-05-04 ENCOUNTER — Ambulatory Visit: Admission: RE | Admit: 2017-05-04 | Payer: Medicare Other | Source: Ambulatory Visit

## 2017-05-04 ENCOUNTER — Telehealth: Payer: Self-pay

## 2017-05-04 NOTE — Telephone Encounter (Signed)
Pt called to let Dr Lindi Adie know what another MD said. When called pt back she insisted on talking with May. Call transferred.

## 2017-05-07 ENCOUNTER — Other Ambulatory Visit: Payer: Self-pay | Admitting: Radiation Oncology

## 2017-05-07 ENCOUNTER — Telehealth: Payer: Self-pay | Admitting: Radiation Oncology

## 2017-05-07 ENCOUNTER — Ambulatory Visit
Admission: RE | Admit: 2017-05-07 | Discharge: 2017-05-07 | Disposition: A | Discharge status: Home / Self Care | Payer: Medicare Other | Source: Ambulatory Visit | Attending: Radiation Oncology | Admitting: Radiation Oncology

## 2017-05-07 DIAGNOSIS — C7951 Secondary malignant neoplasm of bone: Secondary | ICD-10-CM

## 2017-05-07 DIAGNOSIS — C770 Secondary and unspecified malignant neoplasm of lymph nodes of head, face and neck: Secondary | ICD-10-CM

## 2017-05-07 DIAGNOSIS — G8918 Other acute postprocedural pain: Secondary | ICD-10-CM

## 2017-05-07 DIAGNOSIS — C50919 Malignant neoplasm of unspecified site of unspecified female breast: Secondary | ICD-10-CM

## 2017-05-07 DIAGNOSIS — Z510 Encounter for antineoplastic radiation therapy: Secondary | ICD-10-CM | POA: Diagnosis not present

## 2017-05-07 MED ORDER — FENTANYL 50 MCG/HR TD PT72: 50.0000 ug | MEDICATED_PATCH | TRANSDERMAL | 0 refills | Status: DC

## 2017-05-07 MED ORDER — DILAUDID 2 MG PO TABS: 2.0000 mg | ORAL_TABLET | ORAL | 0 refills | Status: DC | PRN

## 2017-05-07 NOTE — Telephone Encounter (Signed)
I spoke with the patient about her pain medication. She requested a refill of her dilaudid and I just gave her a prescription for 90 tablets about a week and a half ago. She has twenty left, and one 25 mcg fentanyl patch left. I think she is needing an increase in her long acting at this rate. She is amenable to this. She will place another 25 mcg patch of fentanyl today, and take off the one she placed yesterday on Wednesday. She will take off the one she places today on Thursday. On Thursday she will start her new prescription for 50 mcg which will be just one patch. I will refill her dilaudid as well so she has enough medication until she has improvement in the increase in her long acting medication.

## 2017-05-07 NOTE — Telephone Encounter (Signed)
Sounds good.  Thanks for letting us know!   [name]

## 2017-05-08 ENCOUNTER — Ambulatory Visit
Admission: RE | Admit: 2017-05-08 | Discharge: 2017-05-08 | Disposition: A | Discharge status: Home / Self Care | Payer: Medicare Other | Source: Ambulatory Visit | Attending: Radiation Oncology | Admitting: Radiation Oncology

## 2017-05-08 ENCOUNTER — Encounter: Payer: Self-pay | Admitting: *Deleted

## 2017-05-08 DIAGNOSIS — Z510 Encounter for antineoplastic radiation therapy: Secondary | ICD-10-CM | POA: Diagnosis not present

## 2017-05-08 NOTE — Progress Notes (Signed)
Moved old dressing saturated with serous sangous  Drainage, wet to dry saline dressing over tumor left above breast,placed abd dressing over dressing and medipore taped and secured, escorted patient via w/c to outside lobby, faxed [facility] patients schedule through 05/16/17 eot day  [phone] 12:43 PM'

## 2017-05-09 ENCOUNTER — Encounter: Payer: Self-pay | Admitting: *Deleted

## 2017-05-09 ENCOUNTER — Ambulatory Visit
Admission: RE | Admit: 2017-05-09 | Discharge: 2017-05-09 | Disposition: A | Discharge status: Home / Self Care | Payer: Medicare Other | Source: Ambulatory Visit | Attending: Radiation Oncology | Admitting: Radiation Oncology

## 2017-05-09 ENCOUNTER — Other Ambulatory Visit: Payer: Self-pay | Admitting: Radiation Oncology

## 2017-05-09 ENCOUNTER — Telehealth: Payer: Self-pay | Admitting: *Deleted

## 2017-05-09 ENCOUNTER — Ambulatory Visit: Payer: Medicare Other

## 2017-05-09 DIAGNOSIS — C50919 Malignant neoplasm of unspecified site of unspecified female breast: Secondary | ICD-10-CM

## 2017-05-09 DIAGNOSIS — C770 Secondary and unspecified malignant neoplasm of lymph nodes of head, face and neck: Secondary | ICD-10-CM

## 2017-05-09 DIAGNOSIS — Z510 Encounter for antineoplastic radiation therapy: Secondary | ICD-10-CM | POA: Diagnosis not present

## 2017-05-09 MED ORDER — FENTANYL 25 MCG/HR TD PT72: 25.0000 ug | MEDICATED_PATCH | TRANSDERMAL | 0 refills | Status: DC

## 2017-05-09 NOTE — Telephone Encounter (Signed)
Called and spoke with patient, her rx for fentanyl 45mcg is ready to pick up tomorrow at nurses station ,she already has left today, she thabnked this Rn for the call and will get tomorrow 12:12 PM

## 2017-05-10 ENCOUNTER — Ambulatory Visit: Payer: Medicare Other

## 2017-05-10 ENCOUNTER — Ambulatory Visit
Admission: RE | Admit: 2017-05-10 | Discharge: 2017-05-10 | Disposition: A | Discharge status: Home / Self Care | Payer: Medicare Other | Source: Ambulatory Visit | Attending: Radiation Oncology | Admitting: Radiation Oncology

## 2017-05-10 ENCOUNTER — Other Ambulatory Visit: Payer: Self-pay | Admitting: Radiation Oncology

## 2017-05-10 ENCOUNTER — Other Ambulatory Visit: Payer: Medicare Other

## 2017-05-10 ENCOUNTER — Telehealth: Payer: Self-pay | Admitting: *Deleted

## 2017-05-10 ENCOUNTER — Ambulatory Visit: Payer: Medicare Other | Admitting: Hematology and Oncology

## 2017-05-10 DIAGNOSIS — C50919 Malignant neoplasm of unspecified site of unspecified female breast: Secondary | ICD-10-CM

## 2017-05-10 DIAGNOSIS — G8918 Other acute postprocedural pain: Secondary | ICD-10-CM | POA: Insufficient documentation

## 2017-05-10 DIAGNOSIS — Z510 Encounter for antineoplastic radiation therapy: Secondary | ICD-10-CM | POA: Diagnosis not present

## 2017-05-10 MED ORDER — DILAUDID 2 MG PO TABS: 2.0000 mg | ORAL_TABLET | ORAL | 0 refills | Status: DC | PRN

## 2017-05-10 NOTE — Telephone Encounter (Signed)
Received vm call from pt stating that she missed her appt today & needs to r/s . She states that she had radiation today & has 4 more treatments to go.  Message sent to schedulers to r/s pt.

## 2017-05-11 ENCOUNTER — Ambulatory Visit
Admission: RE | Admit: 2017-05-11 | Discharge: 2017-05-11 | Disposition: A | Discharge status: Home / Self Care | Payer: Medicare Other | Source: Ambulatory Visit | Attending: Radiation Oncology | Admitting: Radiation Oncology

## 2017-05-11 DIAGNOSIS — Z510 Encounter for antineoplastic radiation therapy: Secondary | ICD-10-CM | POA: Diagnosis not present

## 2017-05-11 NOTE — Progress Notes (Signed)
Dressing change+ from l;eft tumor above breast, scant drainage, scabbed areas look better, wet to dry saline 4x4 gause applied over tumor asite and abd dressing applive over gause dressing, medipore taped and secured, escorted patient via w/c to lobby for transportation  home 12:17 PM

## 2017-05-14 ENCOUNTER — Ambulatory Visit
Admission: RE | Admit: 2017-05-14 | Discharge: 2017-05-14 | Disposition: A | Discharge status: Home / Self Care | Payer: Medicare Other | Source: Ambulatory Visit | Attending: Radiation Oncology | Admitting: Radiation Oncology

## 2017-05-14 ENCOUNTER — Ambulatory Visit: Payer: Medicare Other

## 2017-05-14 DIAGNOSIS — Z510 Encounter for antineoplastic radiation therapy: Secondary | ICD-10-CM | POA: Diagnosis not present

## 2017-05-15 ENCOUNTER — Ambulatory Visit: Payer: Medicare Other

## 2017-05-15 ENCOUNTER — Ambulatory Visit
Admission: RE | Admit: 2017-05-15 | Discharge: 2017-05-15 | Disposition: A | Discharge status: Home / Self Care | Payer: Medicare Other | Source: Ambulatory Visit | Attending: Radiation Oncology | Admitting: Radiation Oncology

## 2017-05-15 DIAGNOSIS — Z510 Encounter for antineoplastic radiation therapy: Secondary | ICD-10-CM | POA: Diagnosis not present

## 2017-05-16 ENCOUNTER — Ambulatory Visit
Admission: RE | Admit: 2017-05-16 | Discharge: 2017-05-16 | Disposition: A | Discharge status: Home / Self Care | Payer: Medicare Other | Source: Ambulatory Visit | Attending: Radiation Oncology | Admitting: Radiation Oncology

## 2017-05-16 ENCOUNTER — Encounter: Payer: Self-pay | Admitting: Radiation Oncology

## 2017-05-16 ENCOUNTER — Telehealth: Payer: Self-pay

## 2017-05-16 ENCOUNTER — Ambulatory Visit: Payer: Medicare Other

## 2017-05-16 DIAGNOSIS — Z510 Encounter for antineoplastic radiation therapy: Secondary | ICD-10-CM | POA: Diagnosis not present

## 2017-05-16 NOTE — Telephone Encounter (Signed)
Pt called to notify Dr.Gudena that she had completed radiation and will need a follow up appt with him or [name] soon because she had missed her previous appt. Pt states that she has a hard time hydrating orally and is requesting time for IVF's if possible. Sent message to scheduling to schedule pt for symptom managent IVF', along with labs/fl/md appt this week. Told pt that someone will contact her back to confirm time/date.Pt verbalized understanding.

## 2017-05-17 ENCOUNTER — Telehealth: Payer: Self-pay | Admitting: Hematology and Oncology

## 2017-05-17 ENCOUNTER — Other Ambulatory Visit: Payer: Self-pay | Admitting: Hematology and Oncology

## 2017-05-18 ENCOUNTER — Telehealth: Payer: Self-pay | Admitting: Hematology and Oncology

## 2017-05-18 ENCOUNTER — Other Ambulatory Visit (HOSPITAL_BASED_OUTPATIENT_CLINIC_OR_DEPARTMENT_OTHER): Payer: Medicare Other

## 2017-05-18 ENCOUNTER — Ambulatory Visit: Payer: Medicare Other | Admitting: Hematology and Oncology

## 2017-05-18 ENCOUNTER — Ambulatory Visit: Payer: Medicare Other

## 2017-05-18 ENCOUNTER — Telehealth: Payer: Self-pay | Admitting: Medical Oncology

## 2017-05-18 ENCOUNTER — Ambulatory Visit (HOSPITAL_BASED_OUTPATIENT_CLINIC_OR_DEPARTMENT_OTHER): Payer: Medicare Other | Admitting: Hematology and Oncology

## 2017-05-18 ENCOUNTER — Ambulatory Visit (HOSPITAL_BASED_OUTPATIENT_CLINIC_OR_DEPARTMENT_OTHER): Payer: Medicare Other

## 2017-05-18 VITALS — BP 135/70 | HR 79 | Temp 98.4°F | Resp 20 | Ht 62.0 in | Wt 106.0 lb

## 2017-05-18 VITALS — BP 106/61 | HR 83 | Temp 98.4°F | Resp 20 | Ht 62.0 in | Wt 106.7 lb

## 2017-05-18 DIAGNOSIS — C50011 Malignant neoplasm of nipple and areola, right female breast: Secondary | ICD-10-CM

## 2017-05-18 DIAGNOSIS — Z5189 Encounter for other specified aftercare: Secondary | ICD-10-CM

## 2017-05-18 DIAGNOSIS — C7800 Secondary malignant neoplasm of unspecified lung: Secondary | ICD-10-CM | POA: Diagnosis not present

## 2017-05-18 DIAGNOSIS — C7951 Secondary malignant neoplasm of bone: Secondary | ICD-10-CM

## 2017-05-18 DIAGNOSIS — R1030 Lower abdominal pain, unspecified: Secondary | ICD-10-CM | POA: Diagnosis not present

## 2017-05-18 DIAGNOSIS — C50912 Malignant neoplasm of unspecified site of left female breast: Secondary | ICD-10-CM

## 2017-05-18 DIAGNOSIS — C50911 Malignant neoplasm of unspecified site of right female breast: Secondary | ICD-10-CM

## 2017-05-18 DIAGNOSIS — C50919 Malignant neoplasm of unspecified site of unspecified female breast: Secondary | ICD-10-CM

## 2017-05-18 DIAGNOSIS — C50012 Malignant neoplasm of nipple and areola, left female breast: Secondary | ICD-10-CM

## 2017-05-18 DIAGNOSIS — C770 Secondary and unspecified malignant neoplasm of lymph nodes of head, face and neck: Secondary | ICD-10-CM

## 2017-05-18 DIAGNOSIS — Z170 Estrogen receptor positive status [ER+]: Secondary | ICD-10-CM

## 2017-05-18 DIAGNOSIS — C7801 Secondary malignant neoplasm of right lung: Secondary | ICD-10-CM

## 2017-05-18 LAB — COMPREHENSIVE METABOLIC PANEL
ALK PHOS: 65 U/L (ref 40–150)
ALT: 12 U/L (ref 0–55)
ANION GAP: 9 meq/L (ref 3–11)
AST: 30 U/L (ref 5–34)
Albumin: 2.8 g/dL — ABNORMAL LOW (ref 3.5–5.0)
BILIRUBIN TOTAL: 0.47 mg/dL (ref 0.20–1.20)
BUN: 12.4 mg/dL (ref 7.0–26.0)
CALCIUM: 9.5 mg/dL (ref 8.4–10.4)
CO2: 28 mEq/L (ref 22–29)
Chloride: 99 mEq/L (ref 98–109)
Creatinine: 0.7 mg/dL (ref 0.6–1.1)
EGFR: >60 mL/min/{1.73_m2} (ref >60)
Glucose: 97 mg/dl (ref 70–140)
POTASSIUM: 4 meq/L (ref 3.5–5.1)
Sodium: 135 mEq/L — ABNORMAL LOW (ref 136–145)
Total Protein: 7.7 g/dL (ref 6.4–8.3)

## 2017-05-18 LAB — CBC WITH DIFFERENTIAL/PLATELET
BASO%: 0.2 % (ref 0.0–2.0)
BASOS ABS: 0 10*3/uL (ref 0.0–0.1)
EOS%: 1.9 % (ref 0.0–7.0)
Eosinophils Absolute: 0.2 10*3/uL (ref 0.0–0.5)
HCT: 30.2 % — ABNORMAL LOW (ref 34.8–46.6)
HGB: 9.5 g/dL — ABNORMAL LOW (ref 11.6–15.9)
LYMPH%: 14.3 % (ref 14.0–49.7)
MCH: 21.1 pg — AB (ref 25.1–34.0)
MCHC: 31.5 g/dL (ref 31.5–36.0)
MCV: 67.1 fL — ABNORMAL LOW (ref 79.5–101.0)
MONO#: 0.6 10*3/uL (ref 0.1–0.9)
MONO%: 6.5 % (ref 0.0–14.0)
NEUT#: 7 10*3/uL — ABNORMAL HIGH (ref 1.5–6.5)
NEUT%: 77.1 % — AB (ref 38.4–76.8)
PLATELETS: 289 10*3/uL (ref 145–400)
RBC: 4.5 10*6/uL (ref 3.70–5.45)
RDW: 19.4 % — ABNORMAL HIGH (ref 11.2–14.5)
WBC: 9.1 10*3/uL (ref 3.9–10.3)
lymph#: 1.3 10*3/uL (ref 0.9–3.3)

## 2017-05-18 MED ORDER — SODIUM CHLORIDE 0.9 % IV SOLN: Freq: Once | INTRAVENOUS | Status: DC

## 2017-05-18 MED ORDER — SODIUM CHLORIDE 0.9 % IV SOLN: INTRAVENOUS | Status: DC

## 2017-05-18 MED ORDER — LORAZEPAM 1 MG PO TABS: ORAL_TABLET | ORAL | 2 refills | Status: DC

## 2017-05-18 MED ORDER — HEPARIN SOD (PORK) LOCK FLUSH 100 UNIT/ML IV SOLN
500.0000 [IU] | Freq: Once | INTRAVENOUS | Status: AC | PRN
  Administered 2017-05-18: 500 [IU] via INTRAVENOUS
  Filled 2017-05-18: qty 5

## 2017-05-18 MED ORDER — SODIUM CHLORIDE 0.9 % IV SOLN
Freq: Once | INTRAVENOUS | Status: AC
  Administered 2017-05-18: 12:00:00 via INTRAVENOUS

## 2017-05-18 MED ORDER — DICYCLOMINE HCL 10 MG PO CAPS: 10.0000 mg | ORAL_CAPSULE | Freq: Three times a day (TID) | ORAL | 3 refills | Status: DC | PRN

## 2017-05-18 MED ORDER — SODIUM CHLORIDE 0.9% FLUSH
10.0000 mL | INTRAVENOUS | Status: DC | PRN
  Administered 2017-05-18: 10 mL via INTRAVENOUS
  Filled 2017-05-18: qty 10

## 2017-05-18 NOTE — Telephone Encounter (Signed)
Gave patient avs and calendar with appts per 10/19 los

## 2017-05-18 NOTE — Telephone Encounter (Signed)
Pt here . Why were appt cancelled? She never said to cancel appt. Appts request sent

## 2017-05-18 NOTE — Patient Instructions (Signed)

## 2017-05-18 NOTE — Assessment & Plan Note (Addendum)
Bilateral breast cancers are usually diagnosed 2015 treated with surgery, radiation, anastrozole Metastatic breast cancer diagnosed 05/28/2015 as subpectoral mass, ER 5%, PR 0%, HER-2 negative, failed Xeloda and carboplatin and gemcitabine  CT CAP04/17/2017:RP and retrocrural lymphadenopathy, large uterine fibroids with necrosis;  MRI Abd: RP lymphadenopathy CT chest 11/20/16: Worsening bulky conglomeration of mediastinal and right hilar lymph nodes, lung nodules subcentimeter size slight increase, left breast mass was 3.7 x 5.8 cm now 5.4 x 7.4 cm  Current treatment: 1. Halaven days 1 and 8 every 3 weeks completed 6 cycles (delayed due to surgery); will be restarted 05/25/2017  Chemotherapy toxicities: 1. Chemotherapy-induced peripheral neuropathy: Will closely monitor her symptoms. Her symptoms appear to be stable. She has grade 2 neuropathy 2. Fatigue 3. Alopecia    Scalp lesion: received radiation along with left chest wall Chemotherapy will resume 05/25/17  Return to clinic 1 week to re-start chemo

## 2017-05-18 NOTE — Telephone Encounter (Signed)
No entry 

## 2017-05-18 NOTE — Progress Notes (Signed)
Patient Care Team: [name], MD (Inactive) as PCP - General (Family Medicine) Rothbart, Cristopher Estimable, MD (Cardiology) [name], MD as Consulting Physician (Gastroenterology)  DIAGNOSIS:  Encounter Diagnoses  Name Primary?  . Lower abdominal pain   . Metastatic breast cancer ([place]) Yes  . Bone metastases ([place])   . Malignant neoplasm metastatic to right lung ([place])   . Bilateral malignant neoplasm involving both nipple and areola in female, unspecified estrogen receptor status ([place])     SUMMARY OF ONCOLOGIC HISTORY:   Bilateral breast cancer ([place])   07/23/2013 Mammogram    Bilateral breast masses. 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 N1 M0 stage IIB ER 100%, PR 0%, HER-2       06/17/2014 -  Radiation Therapy    Adjuvant radiation therapy      09/14/2014 -  Anti-estrogen oral therapy    Anastrozole 1 mg daily      05/28/2015 Imaging    CT scans: Enlarging subpectoral masses 3.1 x 3.5 cm, posteriorly lower  density mass 4.5 x 2.1 cm, several right-sided lung nodules right lower lobe 1.4 cm, 3 other right lung nodules, 1.7 cm right iliac bone lesion      06/21/2015 Procedure    Left subpectoral mass biopsy: Invasive high-grade ductal carcinoma ER 5%, PR 0%, HER-2 negative ratio 1.29      09/01/2015 Imaging    Left chest wall mass increased in size 7.2 x 5.1 cm, multiple subcutaneous nodules, increase in the lung nodules both in number as well as in the size of existing nodules      09/10/2015 -  Chemotherapy    Carboplatin, gemcitabine days 1 and 8 q 3 weeks, carboplatin discontinued for neuropathy (treatment break from 01/20/2016 to 03/24/2016); Added Carboplatin back 04/14/16 (for progression)      01/26/2016 Imaging    Marked improvement in size of lung nodules with many of the nodules resolved index right lower lobe nodule 1.9 x 1.8 cm is now 0.7 x 0.6 cm, reduction in the size of left breast mass 7.2 cm down to 2.6 cm, enlargement in the hypodense mass in the uterus      04/06/2016 Imaging    CT chest: Interval progression of pre-existing lung nodules new lung metastases (42m, 149m 2.9 cm, 1.6 cm), interval progression of disease in the left breast 4.2 cm (was 2.6 cm), additional nodules 2.3  cm and 3.2 cm; CT head: scalp mass 1.9 cm      06/27/2016 Imaging    Ct chest: Stable lesions in lt breast deep to Left pectoralis, cystic lesion loc ant in left breast inc compared to previous; decrease in lung nodules (6m to 3 mm; 12 mm to 7 mm, RML nodule 2.9 cm to 1.8 cm, RLL 141mto 7 mm)      11/14/2016 Imaging    CT CAP:RP and retrocrural lymphadenopathy, large uterine fibroids with necrosis; MRI Abd: RP lymphadenopathy CT chest 11/20/16: Worsening bulky conglomeration of mediastinal and right hilar lymph nodes, lung nodules subcentimeter size slight increase, left breast mass was 3.7 x 5.8 cm now 5.4 x 7.4 cm      12/08/2016 -  Chemotherapy    Palliative chemotherapy with Halaven days 1 and 8  every 3 weeks stopped on 1 2018, to be resumed 05/25/2017       04/30/2017 - 05/18/2017 Radiation Therapy    Palliative radiation therapy to the scalp and the left chest wall lesion       [place]ometrial cancer ([place]  02/13/2017 Surgery    Hysterectomy with BSO: HG Serous carcinoma 7.7 cm (>50% Myometrial inv)involving Uterus and Bilateral ovaries, Omentum: No carcinoma; T3a (FIGO stage IIIa)       CHIEF COMPLIANT: follow-up after completion of radiation therapy to receive IV fluids  INTERVAL HISTORY: Paula ZERNs a 89ear old with above-mentioned history of metastatic breast cancer 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 She is here to discuss a treatment plan. She underwent radiation therapy to the scalp lesion as well as left chest wall and reportedly having felt improvement in the pain, she is here today to discuss the further treatment plan. [facility] She appears to be dehydrated and is hoping to get some fluids.  REVIEW OF SYSTEMS:   Constitutional: Denies fevers, chills or abnormal weight loss Eyes: Denies blurriness of vision, scalp lesion appears to be dry and no more bleeding    Ears, nose, mouth, throat, and face: Denies mucositis or sore throat Respiratory: Denies cough, dyspnea or wheezes Cardiovascular: Denies palpitation, chest discomfort Gastrointestinal:  Denies nausea, heartburn or change in bowel habits Skin: Denies abnormal skin rashes Lymphatics: Denies new lymphadenopathy or easy bruising Neurological:Denies numbness, tingling or new weaknesses Behavioral/Psych: Mood is stable, no new changes  Extremities: No lower extremity edema Breast:  Left chest wall mass extending to the axilla with slight oozing All other systems were reviewed with the patient and are negative.  I have reviewed the past medical history, past surgical history, social history and family history with the patient and they are unchanged from  previous note.  ALLERGIES:  is allergic to codeine and morphine and related.  MEDICATIONS:  Current Outpatient Prescriptions  Medication Sig Dispense Refill  . B Complex-C (SUPER B COMPLEX PO) Take 1 tablet by mouth daily.    . calcium-vitamin D (OSCAL-500) 500-400 MG-UNIT tablet Take 1 tablet by mouth 2 (two) times daily. (Patient taking differently: Take 1 tablet by mouth 2 (two) times daily. Pt takes once daily.) 60 tablet 3  . Clobetasol Prop Emollient Base (CLOBETASOL PROPIONATE E) 0.05 % emollient cream Apply 1 application topically 2 (two) times daily. 30 g 0  . dicyclomine (BENTYL) 10 MG capsule Take 1 capsule (10 mg total) by mouth 3 (three) times daily as needed for spasms. 30 capsule 3  . DILAUDID 2 MG tablet Take 1-2 tablets (  2-4 mg total) by mouth every 4 (four) hours as needed for severe pain. 120 tablet 0  . docusate sodium (COLACE) 100 MG capsule Take 1 capsule (100 mg total) by mouth 2 (two) times daily. 10 capsule 0  . fentaNYL (DURAGESIC - DOSED MCG/HR) 25 MCG/HR patch Place 1 patch (25 mcg total) onto the skin every 3 (three) days. 10 patch 0  . gabapentin (NEURONTIN) 300 MG capsule TAKE 2 CAPSULES (600 MG TOTAL) BY MOUTH 3 (THREE) TIMES DAILY. 180 capsule 2  . hyaluronate sodium (RADIAPLEXRX) GEL Apply 1 application topically 2 (two) times daily. Apply toleft breast tumor after rad tx and bedtime daily    . ibuprofen (ADVIL,MOTRIN) 600 MG tablet Take 1 tablet (600 mg total) by mouth every 8 (eight) hours as needed (mild pain). 30 tablet 0  . lidocaine-prilocaine (EMLA) cream Apply to affected area once 30 g 3  . LORazepam (ATIVAN) 1 MG tablet TAKE 1 TABLET BY MOUTH EVERYDAY AT BEDTIME 30 tablet 2  . nystatin (MYCOSTATIN) 100000 UNIT/ML suspension Take 5 mLs (500,000 Units total) by mouth 4 (four) times daily. 60 mL 0  . polyethylene glycol (MIRALAX / GLYCOLAX) packet Take 17 g by mouth 2 (two) times daily. 14 each 0  . prochlorperazine (COMPAZINE) 10 MG tablet Take 1 tablet  (10 mg total) by mouth every 6 (six) hours as needed (Nausea or vomiting). 30 tablet 1  . zolpidem (AMBIEN) 10 MG tablet Take 1 tablet (10 mg total) by mouth at bedtime. 30 tablet 2   No current facility-administered medications for this visit.    Facility-Administered Medications Ordered in Other Visits  Medication Dose Route Frequency Provider Last Rate Last Dose  . 0.9 %  sodium chloride infusion   Intravenous Once Nicholas Lose, MD      . sodium chloride flush (NS) 0.9 % injection 10 mL  10 mL Intracatheter PRN Nicholas Lose, MD   10 mL at 03/16/17 1054  . sodium chloride flush (NS) 0.9 % injection 10 mL  10 mL Intravenous PRN Nicholas Lose, MD   10 mL at 05/18/17 1259    PHYSICAL EXAMINATION: ECOG PERFORMANCE STATUS: 2 - Symptomatic, <50% confined to bed  Vitals:   05/18/17 1023  BP: 106/61  Pulse: 83  Resp: 20  Temp: 98.4 F (36.9 C)  SpO2: 99%   Filed Weights   05/18/17 1023  Weight: 106 lb 11.2 oz (48.4 kg)    GENERAL:alert, no distress and comfortable SKIN: skin color, texture, turgor are normal, no rashes or significant lesions EYES: normal, Conjunctiva are pink and non-injected, sclera clear OROPHARYNX:no exudate, no erythema and lips, buccal mucosa, and tongue normal  NECK: supple, thyroid normal size, non-tender, without nodularity LYMPH:  no palpable lymphadenopathy in the cervical, axillary or inguinal LUNGS: clear to auscultation and percussion with normal breathing effort HEART: regular rate & rhythm and no murmurs and no lower extremity edema ABDOMEN:abdomen soft, non-tender and normal bowel sounds MUSCULOSKELETAL:no cyanosis of digits and no clubbing  NEURO: alert & oriented x 3 with fluent speech, no focal motor/sensory deficits EXTREMITIES: No lower extremity edema   LABORATORY DATA:  I have reviewed the data as listed   Chemistry      Component Value Date/Time   NA 135 (L) 05/18/2017 0940   K 4.0 05/18/2017 0940   CL 100 (L) 02/14/2017 0612    CO2 28 05/18/2017 0940   BUN 12.4 05/18/2017 0940   CREATININE 0.7 05/18/2017 0940      Component Value  Date/Time   CALCIUM 9.5 05/18/2017 0940   ALKPHOS 65 05/18/2017 0940   AST 30 05/18/2017 0940   ALT 12 05/18/2017 0940   BILITOT 0.47 05/18/2017 0940       Lab Results  Component Value Date   WBC 9.1 05/18/2017   HGB 9.5 (L) 05/18/2017   HCT 30.2 (L) 05/18/2017   MCV 67.1 (L) 05/18/2017   PLT 289 05/18/2017   NEUTROABS 7.0 (H) 05/18/2017    ASSESSMENT & PLAN:  Bilateral breast cancer ([place]) Bilateral breast cancers are usually diagnosed 2015 treated with surgery, radiation, anastrozole Metastatic breast cancer diagnosed 05/28/2015 as subpectoral mass, ER 5%, PR 0%, HER-2 negative, failed Xeloda and carboplatin and gemcitabine  CT CAP04/17/2017:RP and retrocrural lymphadenopathy, large uterine fibroids with necrosis;  MRI Abd: RP lymphadenopathy CT chest 11/20/16: Worsening bulky conglomeration of mediastinal and right hilar lymph nodes, lung nodules subcentimeter size slight increase, left breast mass was 3.7 x 5.8 cm now 5.4 x 7.4 cm  Current treatment: 1. Halaven days 1 and 8 every 3 weeks completed 6 cycles (delayed due to surgery); will be restarted 05/25/2017  Chemotherapy toxicities: 1. Chemotherapy-induced peripheral neuropathy: Will closely monitor her symptoms. Her symptoms appear to be stable. She has grade 2 neuropathy 2. Fatigue 3. Alopecia    Scalp lesion: received radiation along with left chest wall. Patient is interested in getting surgery for the scalp lesion. I will send this message to Dr. Marlou Starks to discuss this with her.  Chemotherapy will resume 05/25/17  Return to clinic 1 week to re-start chemo   I spent 25 minutes talking to the patient of which more than half was spent in counseling and coordination of care.  No orders of the defined types were placed in this encounter.  The patient has a good understanding of the overall plan. she agrees  with it. she will call with any problems that may develop before the next visit here.   Rulon Eisenmenger, MD 05/18/17

## 2017-05-18 NOTE — Progress Notes (Signed)
Pt had requested to be given IV fluids after her appt with Dr. Lindi Adie. Port already accessed. Fluids hooked up and infusing well.   Pt c/o feeling cold. Given extra blankets, warm soup and hot chocolate.  After an hour of fluids pt states she is too cold to stay to complete fluids.  No availability in infsuin room with heated chairs for pt to complete her fluids. Pt states she was the one who asked for fluids and now she prepares to go home. VSS. Port de-accessed.  Given supplies for dressing changes to her right chest wall.  Pt taken to front lobby in w/c to wait for her transportation.

## 2017-05-18 NOTE — Patient Instructions (Signed)
Dehydration, Adult Dehydration is a condition in which there is not enough fluid or water in the body. This happens when you lose more fluids than you take in. Important organs, such as the kidneys, brain, and heart, cannot function without a proper amount of fluids. Any loss of fluids from the body can lead to dehydration. Dehydration can range from mild to severe. This condition should be treated right away to prevent it from becoming severe. What are the causes? This condition may be caused by:  Vomiting.  Diarrhea.  Excessive sweating, such as from heat exposure or exercise.  Not drinking enough fluid, especially: ? When ill. ? While doing activity that requires a lot of energy.  Excessive urination.  Fever.  Infection.  Certain medicines, such as medicines that cause the body to lose excess fluid (diuretics).  Inability to access safe drinking water.  Reduced physical ability to get adequate water and food.  What increases the risk? This condition is more likely to develop in people:  Who have a poorly controlled long-term (chronic) illness, such as diabetes, heart disease, or kidney disease.  Who are age 65 or older.  Who are disabled.  Who live in a place with high altitude.  Who play endurance sports.  What are the signs or symptoms? Symptoms of mild dehydration may include:  Thirst.  Dry lips.  Slightly dry mouth.  Dry, warm skin.  Dizziness. Symptoms of moderate dehydration may include:  Very dry mouth.  Muscle cramps.  Dark urine. Urine may be the color of tea.  Decreased urine production.  Decreased tear production.  Heartbeat that is irregular or faster than normal (palpitations).  Headache.  Light-headedness, especially when you stand up from a sitting position.  Fainting (syncope). Symptoms of severe dehydration may include:  Changes in skin, such as: ? Cold and clammy skin. ? Blotchy (mottled) or pale skin. ? Skin that does  not quickly return to normal after being lightly pinched and released (poor skin turgor).  Changes in body fluids, such as: ? Extreme thirst. ? No tear production. ? Inability to sweat when body temperature is high, such as in hot weather. ? Very little urine production.  Changes in vital signs, such as: ? Weak pulse. ? Pulse that is more than 100 beats a minute when sitting still. ? Rapid breathing. ? Low blood pressure.  Other changes, such as: ? Sunken eyes. ? Cold hands and feet. ? Confusion. ? Lack of energy (lethargy). ? Difficulty waking up from sleep. ? Short-term weight loss. ? Unconsciousness. How is this diagnosed? This condition is diagnosed based on your symptoms and a physical exam. Blood and urine tests may be done to help confirm the diagnosis. How is this treated? Treatment for this condition depends on the severity. Mild or moderate dehydration can often be treated at home. Treatment should be started right away. Do not wait until dehydration becomes severe. Severe dehydration is an emergency and it needs to be treated in a hospital. Treatment for mild dehydration may include:  Drinking more fluids.  Replacing salts and minerals in your blood (electrolytes) that you may have lost. Treatment for moderate dehydration may include:  Drinking an oral rehydration solution (ORS). This is a drink that helps you replace fluids and electrolytes (rehydrate). It can be found at pharmacies and retail stores. Treatment for severe dehydration may include:  Receiving fluids through an IV tube.  Receiving an electrolyte solution through a feeding tube that is passed through your nose   and into your stomach (nasogastric tube, or NG tube).  Correcting any abnormalities in electrolytes.  Treating the underlying cause of dehydration. Follow these instructions at home:  If directed by your health care provider, drink an ORS: ? Make an ORS by following instructions on the  package. ? Start by drinking small amounts, about  cup (120 mL) every 5-10 minutes. ? Slowly increase how much you drink until you have taken the amount recommended by your health care provider.  Drink enough clear fluid to keep your urine clear or pale yellow. If you were told to drink an ORS, finish the ORS first, then start slowly drinking other clear fluids. Drink fluids such as: ? Water. Do not drink only water. Doing that can lead to having too little salt (sodium) in the body (hyponatremia). ? Ice chips. ? Fruit juice that you have added water to (diluted fruit juice). ? Low-calorie sports drinks.  Avoid: ? Alcohol. ? Drinks that contain a lot of sugar. These include high-calorie sports drinks, fruit juice that is not diluted, and soda. ? Caffeine. ? Foods that are greasy or contain a lot of fat or sugar.  Take over-the-counter and prescription medicines only as told by your health care provider.  Do not take sodium tablets. This can lead to having too much sodium in the body (hypernatremia).  Eat foods that contain a healthy balance of electrolytes, such as bananas, oranges, potatoes, tomatoes, and spinach.  Keep all follow-up visits as told by your health care provider. This is important. Contact a health care provider if:  You have abdominal pain that: ? Gets worse. ? Stays in one area (localizes).  You have a rash.  You have a stiff neck.  You are more irritable than usual.  You are sleepier or more difficult to wake up than usual.  You feel weak or dizzy.  You feel very thirsty.  You have urinated only a small amount of very dark urine over 6-8 hours. Get help right away if:  You have symptoms of severe dehydration.  You cannot drink fluids without vomiting.  Your symptoms get worse with treatment.  You have a fever.  You have a severe headache.  You have vomiting or diarrhea that: ? Gets worse. ? Does not go away.  You have blood or green matter  (bile) in your vomit.  You have blood in your stool. This may cause stool to look black and tarry.  You have not urinated in 6-8 hours.  You faint.  Your heart rate while sitting still is over 100 beats a minute.  You have trouble breathing. This information is not intended to replace advice given to you by your health care provider. Make sure you discuss any questions you have with your health care provider. Document Released: 07/17/2005 Document Revised: 02/11/2016 Document Reviewed: 09/10/2015 Elsevier Interactive Patient Education  2018 [employer]  

## 2017-05-21 ENCOUNTER — Telehealth: Payer: Self-pay | Admitting: *Deleted

## 2017-05-21 ENCOUNTER — Other Ambulatory Visit: Payer: Self-pay

## 2017-05-21 ENCOUNTER — Telehealth: Payer: Self-pay

## 2017-05-21 DIAGNOSIS — C7951 Secondary malignant neoplasm of bone: Secondary | ICD-10-CM

## 2017-05-21 DIAGNOSIS — C50919 Malignant neoplasm of unspecified site of unspecified female breast: Secondary | ICD-10-CM

## 2017-05-21 DIAGNOSIS — R1030 Lower abdominal pain, unspecified: Secondary | ICD-10-CM

## 2017-05-21 DIAGNOSIS — C7801 Secondary malignant neoplasm of right lung: Secondary | ICD-10-CM

## 2017-05-21 MED ORDER — DICYCLOMINE HCL 10 MG PO CAPS: 10.0000 mg | ORAL_CAPSULE | Freq: Three times a day (TID) | ORAL | 3 refills | Status: DC | PRN

## 2017-05-21 NOTE — Telephone Encounter (Signed)
Messages received from patient reporting order for stomach spasms, Dicyclomine (Bentyl) was not called in.  Spoke with patient who occasionally uses [facility] but this order should have gone to [facility] in [place].  Order resent at this time.  Aaron Edelman with [facility] notified to cancel order they received Friday.

## 2017-05-21 NOTE — Telephone Encounter (Signed)
Called pt to let her know that her bentyl medication refill was sent to [facility]. Tried to call pt 3x to confirm this but vm not set up and unable to get a hold of pt.

## 2017-05-24 ENCOUNTER — Other Ambulatory Visit: Payer: Self-pay | Admitting: Hematology and Oncology

## 2017-05-24 DIAGNOSIS — C50112 Malignant neoplasm of central portion of left female breast: Principal | ICD-10-CM

## 2017-05-24 DIAGNOSIS — C50111 Malignant neoplasm of central portion of right female breast: Secondary | ICD-10-CM

## 2017-05-24 NOTE — Progress Notes (Deleted)
[place] [facility] Follow up:    Paula Donna, MD (Inactive) No address on file   DIAGNOSIS: Cancer Staging Bilateral breast cancer [facility]) Staging form: Breast, AJCC 7th Edition - Clinical: Stage IIB (T2, N1, cM0) - Unsigned Staging comments: Staged at breast conference 08/13/13  - Pathologic: No stage assigned - Unsigned  Endometrial cancer [facility]) Staging form: Corpus Uteri - Carcinoma and Carcinosarcoma, AJCC 8th Edition - Pathologic stage from 02/13/2017: FIGO Stage IIIA, calculated as Stage Unknown (pT3a, pNX, cM0) - Signed by Paula Amber, MD on 03/14/2017 - Clinical: No stage assigned - Unsigned   SUMMARY OF ONCOLOGIC HISTORY:   Bilateral breast cancer ([place])   07/23/2013 Mammogram    Bilateral breast masses. 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 N1 M0 stage IIB ER 100%, PR 0%, HER-2       06/17/2014 -  Radiation Therapy    Adjuvant radiation therapy      09/14/2014 -  Anti-estrogen oral therapy     Anastrozole 1 mg daily      05/28/2015 Imaging    CT scans: Enlarging subpectoral masses 3.1 x 3.5 cm, posteriorly lower density mass 4.5 x 2.1 cm, several right-sided lung nodules right lower lobe 1.4 cm, 3 other right lung nodules, 1.7 cm right iliac bone lesion      06/21/2015 Procedure    Left subpectoral mass biopsy: Invasive high-grade ductal carcinoma ER 5%, PR 0%, HER-2 negative ratio 1.29      09/01/2015 Imaging    Left chest wall mass increased in size 7.2 x 5.1 cm, multiple subcutaneous nodules, increase in the lung nodules both in number as well as in the size of existing nodules      09/10/2015 -  Chemotherapy    Carboplatin, gemcitabine days 1 and 8 q 3 weeks, carboplatin discontinued for neuropathy (treatment break from 01/20/2016 to 03/24/2016); Added Carboplatin back 04/14/16 (for progression)      01/26/2016 Imaging    Marked improvement in size of lung nodules with many of the nodules resolved index right lower lobe nodule 1.9 x 1.8 cm is now 0.7 x 0.6 cm, reduction in the size of left breast mass 7.2 cm down to 2.6 cm, enlargement in the hypodense mass in the uterus      04/06/2016 Imaging    CT chest: Interval progression of pre-existing lung nodules new lung metastases (27m, 152m 2.9  cm, 1.6 cm), interval progression of disease in the left breast 4.2 cm (was 2.6 cm), additional nodules 2.3 cm and 3.2 cm; CT head: scalp mass 1.9 cm      06/27/2016 Imaging    Ct chest: Stable lesions in lt breast deep to Left pectoralis, cystic lesion loc ant in left breast inc compared to previous; decrease in lung nodules (81m to 3 mm; 12 mm to 7 mm, RML nodule 2.9 cm to 1.8 cm, RLL 126mto 7 mm)      11/14/2016 Imaging    CT CAP:RP and retrocrural lymphadenopathy, large uterine fibroids with necrosis; MRI Abd: RP lymphadenopathy CT chest 11/20/16: Worsening bulky conglomeration of mediastinal and right hilar lymph nodes, lung nodules subcentimeter size slight increase, left breast mass  was 3.7 x 5.8 cm now 5.4 x 7.4 cm      12/08/2016 -  Chemotherapy    Palliative chemotherapy with Halaven days 1 and 8 every 3 weeks stopped on 1 2018, to be resumed 05/25/2017       04/30/2017 - 05/18/2017 Radiation Therapy    Palliative radiation therapy to the scalp and the left chest wall lesion       Endometrial cancer (HCSard[place]  02/13/2017 Surgery    Hysterectomy with BSO: HG Serous carcinoma 7.7 cm (>50% Myometrial inv)involving Uterus and Bilateral ovaries, Omentum: No carcinoma; T3a (FIGO stage IIIa)       CURRENT THERAPY: E[place]  INTERVAL HISTORY: LiAinsley Spinner89.o. female returns for    Patient Active Problem List   Diagnosis Date Noted  . Acute post-operative pain 05/10/2017  . Endometrial cancer (HCFair Oaks05/21/2018  . Protein-calorie malnutrition, severe 11/15/2016  . UTI (urinary tract infection) 11/14/2016  . COPD (chronic obstructive pulmonary disease) (HCLimestone  . Metastasis to supraclavicular lymph node (HCWapanucka11/19/2017  . Chemotherapy-induced thrombocytopenia 06/13/2016  . Scalp lesion 04/01/2016  . Encounter for central line care 12/08/2015  . Port catheter in place 11/19/2015  . Insomnia 11/01/2015  . Microcytic anemia 09/28/2015  . Lung metastases (HCCollegeville02/04/2016  . Bone metastases (HCGreen [place]12/20/2016  . Goals of care, counseling/discussion 07/13/2015  . Metastatic breast cancer (HCWest Millgrove11/29/2016  . Chronic pain 03/16/2015  . Vaginal bleeding 09/24/2014  . Postherpetic neuralgia 09/03/2014  . Lymphedema 09/03/2014  . Suspected herpes zoster left C5 distribution 08/14/2014  . Neuropathy due to chemotherapeutic drug (HCReserve08/10/2013  . Anxiety 03/03/2014  . Bilateral breast cancer (HCWinslow01/06/2014  . Palpitation   . Chest pain   . Atrial septal defect   . Laboratory test 11/25/2011  . Chronic bronchitis   . Tobacco abuse     is allergic to codeine and morphine and related.  MEDICAL HISTORY: Past Medical History:  Diagnosis Date  . Anemia   .  Anxiety   . Atrial septal defect 1996   Surgical repair in 1996  . Breast cancer (HCSmiths Ferrydx'd 06/2013  . Chest pain    Admitted to [facility] in 09/2011; refused stress test  . Chronic bronchitis   . Chronic pain   . COPD (chronic obstructive pulmonary disease) (HCBelgreen   on xray  . Metastatic cancer to lung (HCTrussvilledx'd 2017  . Palpitation    Tachycardia reported by monitor clerk during a symptomatic spell  . Radiation 06/30/14-08/17/14   Bilateral Breast  . Tobacco abuse    60 pack years; 1.5 packs per day  . Wears dentures    top    SURGICAL HISTORY: Past Surgical History:  Procedure Laterality Date  . ASD REPAIR, OSTIUM PRIMUM  1996   dr Roxy Horseman  . AXILLARY LYMPH NODE DISSECTION Bilateral 04/08/2014   Procedure:  BILATERAL AXILLARY LYMPH NODE DISSECTION;  Surgeon: Autumn Messing III, MD;  Location: [place];  Service: General;  Laterality: Bilateral;  . BREAST BIOPSY Bilateral   . BREAST LUMPECTOMY WITH RADIOACTIVE SEED LOCALIZATION Bilateral 04/08/2014   Procedure: BILATERAL  RADIOACTIVE SEED LOCALIZATION LUMPECTOMY ;  Surgeon: Autumn Messing III, MD;  Location: [place];  Service: General;  Laterality: Bilateral;  . CESAREAN SECTION     x3  . CHOLECYSTECTOMY    . LAPAROTOMY N/A 02/13/2017   Procedure: MINI EXPLORATORY LAPAROTOMY;  Surgeon: Paula Amber, MD;  Location: [facility];  Service: Gynecology;  Laterality: N/A;  . open heart surgery    . PORT A CATH REVISION  1/15   put in   . ROBOTIC ASSISTED TOTAL HYSTERECTOMY WITH BILATERAL SALPINGO OOPHERECTOMY Bilateral 02/13/2017   Procedure: XI ROBOTIC ASSISTED TOTAL HYSTERECTOMY WITH BILATERAL SALPINGO OOPHORECTOMY WITH LYSIS OF ADHESIONS, UTERUS GREATER THAN 250GRAMS;  Surgeon: Paula Amber, MD;  Location: [facility];  Service: Gynecology;  Laterality: Bilateral;  . TUBAL LIGATION      SOCIAL HISTORY: Social History   Social History  . Marital status: Widowed    Spouse name: N/A  . Number of children: N/A  . Years of  education: N/A   Occupational History  . Not on file.   Social History Main Topics  . Smoking status: Former Smoker    Packs/day: 1.50    Years: 40.00    Types: Cigarettes    Quit date: 08/01/2012  . Smokeless tobacco: Never Used  . Alcohol use No  . Drug use: No  . Sexual activity: Not Currently   Other Topics Concern  . Not on file   Social History Narrative  . No narrative on file    FAMILY HISTORY: Family History  Problem Relation Age of Onset  . Breast cancer Maternal Aunt     Review of Systems - Oncology    PHYSICAL EXAMINATION  ECOG PERFORMANCE STATUS: {CHL ONC ECOG [phone]}  There were no vitals filed for this visit.  Physical Exam  LABORATORY DATA:  CBC    Component Value Date/Time   WBC 9.1 05/18/2017 0940   WBC 12.9 (H) 02/14/2017 0612   RBC 4.50 05/18/2017 0940   RBC 4.15 02/14/2017 0612   HGB 9.5 (L) 05/18/2017 0940   HCT 30.2 (L) 05/18/2017 0940   PLT 289 05/18/2017 0940   MCV 67.1 (L) 05/18/2017 0940   MCH 21.1 (L) 05/18/2017 0940   MCH 20.7 (L) 02/14/2017 0612   MCHC 31.5 05/18/2017 0940   MCHC 32.0 02/14/2017 0612   RDW 19.4 (H) 05/18/2017 0940   LYMPHSABS 1.3 05/18/2017 0940   MONOABS 0.6 05/18/2017 0940   EOSABS 0.2 05/18/2017 0940   BASOSABS 0.0 05/18/2017 0940    CMP     Component Value Date/Time   NA 135 (L) 05/18/2017 0940   K 4.0 05/18/2017 0940   CL 100 (L) 02/14/2017 0612   CO2 28 05/18/2017 0940   GLUCOSE 97 05/18/2017 0940   BUN 12.4 05/18/2017 0940   CREATININE 0.7 05/18/2017 0940   CALCIUM 9.5 05/18/2017 0940   PROT 7.7 05/18/2017 0940   ALBUMIN 2.8 (L) 05/18/2017 0940   AST 30 05/18/2017 0940   ALT 12 05/18/2017 0940   ALKPHOS 65 05/18/2017 0940   BILITOT 0.47 05/18/2017 0940  GFRNONAA >60 02/14/2017 0612   GFRAA >60 02/14/2017 2563       PENDING LABS:   RADIOGRAPHIC STUDIES:  No results found.   PATHOLOGY:     ASSESSMENT and THERAPY PLAN:   Bilateral breast cancer  ([place]) Bilateral breast cancers are usually diagnosed 2015 treated with surgery, radiation, anastrozole Metastatic breast cancer diagnosed 05/28/2015 as subpectoral mass, ER 5%, PR 0%, HER-2 negative, failed Xeloda and carboplatin and gemcitabine  CT CAP04/17/2017:RP and retrocrural lymphadenopathy, large uterine fibroids with necrosis;  MRI Abd: RP lymphadenopathy CT chest 11/20/16: Worsening bulky conglomeration of mediastinal and right hilar lymph nodes, lung nodules subcentimeter size slight increase, left breast mass was 3.7 x 5.8 cm now 5.4 x 7.4 cm  Current treatment: 1. Halaven days 1 and 8 every 3 weeks completed 6 cycles (delayed due to surgery); will be restarted 05/25/2017  Chemotherapy toxicities: 1. Chemotherapy-induced peripheral neuropathy: Will closely monitor her symptoms. Her symptoms appear to be stable. She has grade 2 neuropathy 2. Fatigue 3. Alopecia    Scalp lesion: received radiation along with left chest wall Chemotherapy will resume 05/25/17  Return to clinic 1 week to re-start chemo   No orders of the defined types were placed in this encounter.   All questions were answered. The patient knows to call the clinic with any problems, questions or concerns. We can certainly see the patient much sooner if necessary. This note was electronically signed. Scot Dock, NP 05/24/2017

## 2017-05-24 NOTE — Assessment & Plan Note (Deleted)
Bilateral breast cancers are usually diagnosed 2015 treated with surgery, radiation, anastrozole Metastatic breast cancer diagnosed 05/28/2015 as subpectoral mass, ER 5%, PR 0%, HER-2 negative, failed Xeloda and carboplatin and gemcitabine  CT CAP04/17/2017:RP and retrocrural lymphadenopathy, large uterine fibroids with necrosis;  MRI Abd: RP lymphadenopathy CT chest 11/20/16: Worsening bulky conglomeration of mediastinal and right hilar lymph nodes, lung nodules subcentimeter size slight increase, left breast mass was 3.7 x 5.8 cm now 5.4 x 7.4 cm  Current treatment: 1. Halaven days 1 and 8 every 3 weeks completed 6 cycles (delayed due to surgery); will be restarted 05/25/2017  Chemotherapy toxicities: 1. Chemotherapy-induced peripheral neuropathy: Will closely monitor her symptoms. Her symptoms appear to be stable. She has grade 2 neuropathy 2. Fatigue 3. Alopecia    Scalp lesion: received radiation along with left chest wall Chemotherapy will resume 05/25/17  Return to clinic 1 week to re-start chemo

## 2017-05-25 ENCOUNTER — Other Ambulatory Visit: Payer: Medicare Other

## 2017-05-25 ENCOUNTER — Ambulatory Visit: Payer: Medicare Other | Admitting: Adult Health

## 2017-05-25 ENCOUNTER — Ambulatory Visit: Payer: Medicare Other

## 2017-05-29 ENCOUNTER — Encounter: Payer: Self-pay | Admitting: *Deleted

## 2017-05-29 ENCOUNTER — Telehealth: Payer: Self-pay

## 2017-05-29 NOTE — Progress Notes (Signed)
Catherine with Shona Simpson, P.A.  explained the situation that is going on with Paula Navarro about the new upper LCW area.  Will call and offer and appointment today at 1330 for evaluation of the area.  Shona Simpson, P.A. Is letting De. Lindi Adie know about the situation by email. [place] back with Paula Navarro offered a 1330 appointment ;she was not able to come in today because of transportation issues.  Instructed her to continue to cleanse the areas with normal saline and then apply a dry 4x4 and do nt put any normal saline on the 4x4.  Priyah Schmuck was able to repeat the instructions back without difficulty and she plans to have some one look at the areas on Friday, 06-01-17 when she is here at the [facility].

## 2017-05-29 NOTE — Telephone Encounter (Signed)
Hands and feet numb all the time. Used to come and go when took gabapentin. Painful as well. She has trouble with buttons and zippers. She does drop things. She does trip easily, but never fallen. Her feet and hands do cramp. She is currently on 2 capsules TID of gabapentin. Tylenol helps some.   Her tumor is still draining. There is another place near the tumor that is draining as well.  She has lab/flush/infusion on 11/2. She is wanting someone to look at it.   Pt is asking for a call back.

## 2017-05-29 NOTE — Telephone Encounter (Signed)
Pt called to request to see the dr on Friday after labs. Pt would like to discuss her neuropathy symptoms worsening. Pt states that she would like to also discuss about restarting chemo and if it will affect her treatment with her neuropathy. Told pt that Dr.Gudena is out of the office on Friday and will have to see Lindsey,NP to discuss her concerns and see if she needs to hold treatment or change her Neurontin medication. Pt verbalized understanding and thankful for the phone. Pt has no further needs at this time.

## 2017-05-29 NOTE — Progress Notes (Signed)
[place] with Dor[facility] Ch[facility]ua she reported having a new opening area on her upper LCW near the area that is already on her upper LCW she is having brownish to green drainage at times with some odor, noticed on Sunday while taking a shower.  Reports she is cleaning the areas with normal saline then covering with wet 4x4.  Denies having any elevated temperature.  Told I would speak with Shona Simpson, P.A. And call her back.

## 2017-05-29 NOTE — Telephone Encounter (Signed)
Sent message to scheduling to add MD appt with Lindsey,NP prior to her infusion time.

## 2017-05-31 NOTE — Progress Notes (Signed)
  Radiation Oncology         [phone] ________________________________  Name: Paula Navarro MRN: [id number]  Date: 05/16/2017  DOB: [birth year]  End of Treatment Note  Diagnosis:   66 y.o. female with metastatic bilateral breast cancer with chest wall recurrence as well as local failure in the left supraclavicular region previously with scalp metastasis     Indication for treatment::  palliative       Radiation treatment dates:   04/18/2017 - 05/16/2017  Site/dose:   The scalp and left chest were each treated to 40 Gy in 16 fractions using the mode "Photon" and technique "Isodose Plan" delivered daily.  Narrative: The patient tolerated radiation treatment relatively well.   She reported having a poor appetite and lost about 6 pounds over the course of her treatment. She had erythema and scabbing over the skin of the treatment field and kept a dressing over her left chest.  Plan: The patient has completed radiation treatment. The patient will return to radiation oncology clinic for routine followup in one month. I advised the patient to call or return sooner if they have any questions or concerns related to their recovery or treatment. ________________________________  Jodelle Gross, MD, PhD  This document serves as a record of services personally performed by Kyung Rudd, MD. It was created on his behalf by Rae Lips, a trained medical scribe. The creation of this record is based on the scribe's personal observations and the provider's statements to them. This document has been checked and approved by the attending provider.

## 2017-06-01 ENCOUNTER — Ambulatory Visit (HOSPITAL_BASED_OUTPATIENT_CLINIC_OR_DEPARTMENT_OTHER): Payer: Medicare Other | Admitting: Adult Health

## 2017-06-01 ENCOUNTER — Other Ambulatory Visit: Payer: Self-pay

## 2017-06-01 ENCOUNTER — Ambulatory Visit: Payer: Medicare Other

## 2017-06-01 ENCOUNTER — Other Ambulatory Visit (HOSPITAL_BASED_OUTPATIENT_CLINIC_OR_DEPARTMENT_OTHER): Payer: Medicare Other

## 2017-06-01 VITALS — BP 135/88 | HR 85 | Temp 98.7°F | Resp 18 | Ht 62.0 in | Wt 105.6 lb

## 2017-06-01 DIAGNOSIS — C50919 Malignant neoplasm of unspecified site of unspecified female breast: Secondary | ICD-10-CM

## 2017-06-01 DIAGNOSIS — Z170 Estrogen receptor positive status [ER+]: Secondary | ICD-10-CM

## 2017-06-01 DIAGNOSIS — C770 Secondary and unspecified malignant neoplasm of lymph nodes of head, face and neck: Secondary | ICD-10-CM

## 2017-06-01 DIAGNOSIS — C50012 Malignant neoplasm of nipple and areola, left female breast: Secondary | ICD-10-CM | POA: Diagnosis not present

## 2017-06-01 DIAGNOSIS — C50011 Malignant neoplasm of nipple and areola, right female breast: Secondary | ICD-10-CM

## 2017-06-01 DIAGNOSIS — C7951 Secondary malignant neoplasm of bone: Secondary | ICD-10-CM

## 2017-06-01 DIAGNOSIS — C50911 Malignant neoplasm of unspecified site of right female breast: Secondary | ICD-10-CM

## 2017-06-01 DIAGNOSIS — C50912 Malignant neoplasm of unspecified site of left female breast: Secondary | ICD-10-CM

## 2017-06-01 DIAGNOSIS — C7801 Secondary malignant neoplasm of right lung: Secondary | ICD-10-CM

## 2017-06-01 DIAGNOSIS — R1030 Lower abdominal pain, unspecified: Secondary | ICD-10-CM

## 2017-06-01 LAB — CBC WITH DIFFERENTIAL/PLATELET
BASO%: 0.3 % (ref 0.0–2.0)
Basophils Absolute: 0 10*3/uL (ref 0.0–0.1)
EOS%: 1.4 % (ref 0.0–7.0)
Eosinophils Absolute: 0.1 10*3/uL (ref 0.0–0.5)
HCT: 31 % — ABNORMAL LOW (ref 34.8–46.6)
HEMOGLOBIN: 9.8 g/dL — AB (ref 11.6–15.9)
LYMPH#: 1.3 10*3/uL (ref 0.9–3.3)
LYMPH%: 14.4 % (ref 14.0–49.7)
MCH: 20.6 pg — ABNORMAL LOW (ref 25.1–34.0)
MCHC: 31.5 g/dL (ref 31.5–36.0)
MCV: 65.3 fL — ABNORMAL LOW (ref 79.5–101.0)
MONO#: 0.7 10*3/uL (ref 0.1–0.9)
MONO%: 8.1 % (ref 0.0–14.0)
NEUT%: 75.8 % (ref 38.4–76.8)
NEUTROS ABS: 6.7 10*3/uL — AB (ref 1.5–6.5)
Platelets: 325 10*3/uL (ref 145–400)
RBC: 4.74 10*6/uL (ref 3.70–5.45)
RDW: 19.3 % — AB (ref 11.2–14.5)
WBC: 8.8 10*3/uL (ref 3.9–10.3)

## 2017-06-01 LAB — COMPREHENSIVE METABOLIC PANEL
ALBUMIN: 2.9 g/dL — AB (ref 3.5–5.0)
ALK PHOS: 70 U/L (ref 40–150)
ALT: 14 U/L (ref 0–55)
AST: 32 U/L (ref 5–34)
Anion Gap: 9 mEq/L (ref 3–11)
BILIRUBIN TOTAL: 0.65 mg/dL (ref 0.20–1.20)
BUN: 16.7 mg/dL (ref 7.0–26.0)
CO2: 29 mEq/L (ref 22–29)
CREATININE: 0.7 mg/dL (ref 0.6–1.1)
Calcium: 10.4 mg/dL (ref 8.4–10.4)
Chloride: 97 mEq/L — ABNORMAL LOW (ref 98–109)
EGFR: >60 mL/min/{1.73_m2} (ref >60)
GLUCOSE: 99 mg/dL (ref 70–140)
POTASSIUM: 3.9 meq/L (ref 3.5–5.1)
SODIUM: 136 meq/L (ref 136–145)
TOTAL PROTEIN: 8.2 g/dL (ref 6.4–8.3)

## 2017-06-01 MED ORDER — DICYCLOMINE HCL 10 MG PO CAPS: 10.0000 mg | ORAL_CAPSULE | Freq: Three times a day (TID) | ORAL | 3 refills | Status: DC | PRN

## 2017-06-01 MED ORDER — PREGABALIN 50 MG PO CAPS: 50.0000 mg | ORAL_CAPSULE | Freq: Two times a day (BID) | ORAL | 0 refills | Status: DC

## 2017-06-01 MED ORDER — ZOLPIDEM TARTRATE 10 MG PO TABS: 10.0000 mg | ORAL_TABLET | Freq: Every day | ORAL | 2 refills | Status: DC

## 2017-06-01 MED ORDER — SODIUM CHLORIDE 0.9% FLUSH
10.0000 mL | INTRAVENOUS | Status: DC | PRN
  Administered 2017-06-01: 10 mL via INTRAVENOUS
  Filled 2017-06-01: qty 10

## 2017-06-01 NOTE — Assessment & Plan Note (Addendum)
Bilateral breast cancers are usually diagnosed 2015 treated with surgery, radiation, anastrozole Metastatic breast cancer diagnosed 05/28/2015 as subpectoral mass, ER 5%, PR 0%, HER-2 negative, failed Xeloda and carboplatin and gemcitabine  CT CAP04/17/2017:RP and retrocrural lymphadenopathy, large uterine fibroids with necrosis;  MRI Abd: RP lymphadenopathy CT chest 11/20/16: Worsening bulky conglomeration of mediastinal and right hilar lymph nodes, lung nodules subcentimeter size slight increase, left breast mass was 3.7 x 5.8 cm now 5.4 x 7.4 cm  Current treatment: 1. Halaven days 1 and 8 every 3 weeks completed 6 cycles (delayed due to surgery); will be restarted 05/25/2017  Due to her peripheral neuropathy, and the fact that it sounds like it is progressing and quite troublesome, we will hold chemo today, change to Lyrica, and she will f/u with Dr. Lindi Adie next week for evaluation, and planning.

## 2017-06-01 NOTE — Progress Notes (Signed)
[facility] Follow up:    Paula Donna, MD (Inactive) No address on file   DIAGNOSIS: Cancer Staging Bilateral breast cancer [facility]) Staging form: Breast, AJCC 7th Edition - Clinical: Stage IIB (T2, N1, cM0) - Unsigned Staging comments: Staged at breast conference 08/13/13  - Pathologic: No stage assigned - Unsigned  Endometrial cancer [facility]) Staging form: Corpus Uteri - Carcinoma and Carcinosarcoma, AJCC 8th Edition - Pathologic stage from 02/13/2017: FIGO Stage IIIA, calculated as Stage Unknown (pT3a, pNX, cM0) - Signed by Paula Amber, MD on 03/14/2017 - Clinical: No stage assigned - Unsigned   SUMMARY OF ONCOLOGIC HISTORY:   Bilateral breast cancer ([place])   07/23/2013 Mammogram    Bilateral breast masses. 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 N1 M0 stage IIB ER 100%, PR 0%, HER-2       06/17/2014 -  Radiation Therapy    Adjuvant radiation therapy      09/14/2014 -  Anti-estrogen oral therapy     Anastrozole 1 mg daily      05/28/2015 Imaging    CT scans: Enlarging subpectoral masses 3.1 x 3.5 cm, posteriorly lower density mass 4.5 x 2.1 cm, several right-sided lung nodules right lower lobe 1.4 cm, 3 other right lung nodules, 1.7 cm right iliac bone lesion      06/21/2015 Procedure    Left subpectoral mass biopsy: Invasive high-grade ductal carcinoma ER 5%, PR 0%, HER-2 negative ratio 1.29      09/01/2015 Imaging    Left chest wall mass increased in size 7.2 x 5.1 cm, multiple subcutaneous nodules, increase in the lung nodules both in number as well as in the size of existing nodules      09/10/2015 -  Chemotherapy    Carboplatin, gemcitabine days 1 and 8 q 3 weeks, carboplatin discontinued for neuropathy (treatment break from 01/20/2016 to 03/24/2016); Added Carboplatin back 04/14/16 (for progression)      01/26/2016 Imaging    Marked improvement in size of lung nodules with many of the nodules resolved index right lower lobe nodule 1.9 x 1.8 cm is now 0.7 x 0.6 cm, reduction in the size of left breast mass 7.2 cm down to 2.6 cm, enlargement in the hypodense mass in the uterus      04/06/2016 Imaging    CT chest: Interval progression of pre-existing lung nodules new lung metastases (27m, 152m 2.9  cm, 1.6 cm), interval progression of disease in the left breast 4.2 cm (was 2.6 cm), additional nodules 2.3 cm and 3.2 cm; CT head: scalp mass 1.9 cm      06/27/2016 Imaging    Ct chest: Stable lesions in lt breast deep to Left pectoralis, cystic lesion loc ant in left breast inc compared to previous; decrease in lung nodules (89m to 3 mm; 12 mm to 7 mm, RML nodule 2.9 cm to 1.8 cm, RLL 120mto 7 mm)      11/14/2016 Imaging    CT CAP:RP and retrocrural lymphadenopathy, large uterine fibroids with necrosis; MRI Abd: RP lymphadenopathy CT chest 11/20/16: Worsening bulky conglomeration of mediastinal and right hilar lymph nodes, lung nodules subcentimeter size slight increase, left breast mass  was 3.7 x 5.8 cm now 5.4 x 7.4 cm      12/08/2016 -  Chemotherapy    Palliative chemotherapy with Halaven days 1 and 8 every 3 weeks stopped on 1 2018, to be resumed 05/25/2017       04/30/2017 - 05/18/2017 Radiation Therapy    Palliative radiation therapy to the scalp and the left chest wall lesion       [place]  02/13/2017 Surgery    Hysterectomy with BSO: HG Serous carcinoma 7.7 cm (>50% Myometrial inv)involving Uterus and Bilateral ovaries, Omentum: No carcinoma; T3a (FIGO stage IIIa)       CURRENT THERAPY: Eribulin  INTERVAL HISTORY: Paula Navarro.o. female returns for evaluation prior to receiving Eribulin chemotherapy.  [name] concerned because her neuropathy in her hands and feet is much worse.  She is having difficulty holding things, and it is in her entire hand.  She tells me that she hasn't been honest about the neuropathy in the past because she didn't want to miss her treatment.  She says it is very painful and burdensome for her.  The gabapentin she is taking isn't working, and she wants to know if she can try lyrica again.     Patient Active Problem List   Diagnosis Date Noted  . Acute post-operative pain 05/10/2017  . Endometrial cancer ([facility]05/21/2018  . Protein-calorie malnutrition, severe 11/15/2016  . UTI (urinary tract infection) 11/14/2016  . COPD (chronic obstructive pulmonary disease) ([facility]  . Metastasis to supraclavicular lymph node ([facility]11/19/2017  . Chemotherapy-induced thrombocytopenia 06/13/2016  . Scalp lesion 04/01/2016  . Encounter for central line care 12/08/2015  . Port catheter in place 11/19/2015  . Insomnia 11/01/2015  . Microcytic anemia 09/28/2015  . Lung metastases ([facility]02/04/2016  . Bone metastases ([facility]CO[place]12/20/2016  . Goals of care, counseling/discussion 07/13/2015  . Metastatic breast cancer (HCWhitney11/29/2016  . Chronic pain 03/16/2015  . Vaginal bleeding 09/24/2014  . Postherpetic neuralgia 09/03/2014   . Lymphedema 09/03/2014  . Suspected herpes zoster left C5 distribution 08/14/2014  . Neuropathy due to chemotherapeutic drug (HC[place]08/10/2013  . Anxiety 03/03/2014  . Bilateral breast cancer ([facility]01/06/2014  . Palpitation   . Chest pain   . Atrial septal defect   . Laboratory test 11/25/2011  . Chronic bronchitis   . Tobacco abuse     is allergic to codeine and morphine and related.  MEDICAL HISTORY: Past Medical History:  Diagnosis Date  . Anemia   . Anxiety   . Atrial septal defect 1996   Surgical repair in 1996  . Breast cancer (HCDodgevilledx'd 06/2013  . Chest pain    Admitted to [facility] in 09/2011;  refused stress test  . Chronic bronchitis   . Chronic pain   . COPD (chronic obstructive pulmonary disease) ([place])    on xray  . Metastatic cancer to lung ([place]) dx'd 2017  . Palpitation    Tachycardia reported by monitor clerk during a symptomatic spell  . Radiation 06/30/14-08/17/14   Bilateral Breast  . Tobacco abuse    60 pack years; 1.5 packs per day  . Wears dentures    top    SURGICAL HISTORY: Past Surgical History:  Procedure Laterality Date  . ASD REPAIR, OSTIUM PRIMUM  1996   dr Roxy Horseman  . AXILLARY LYMPH NODE DISSECTION Bilateral 04/08/2014   Procedure:  BILATERAL AXILLARY LYMPH NODE DISSECTION;  Surgeon: Autumn Messing III, MD;  Location: [place];  Service: General;  Laterality: Bilateral;  . BREAST BIOPSY Bilateral   . BREAST LUMPECTOMY WITH RADIOACTIVE SEED LOCALIZATION Bilateral 04/08/2014   Procedure: BILATERAL  RADIOACTIVE SEED LOCALIZATION LUMPECTOMY ;  Surgeon: Autumn Messing III, MD;  Location: [place];  Service: General;  Laterality: Bilateral;  . CESAREAN SECTION     x3  . CHOLECYSTECTOMY    . LAPAROTOMY N/A 02/13/2017   Procedure: MINI EXPLORATORY LAPAROTOMY;  Surgeon: Paula Amber, MD;  Location: [facility];  Service: Gynecology;  Laterality: N/A;  . open heart surgery    . PORT A CATH REVISION  1/15   put in   . ROBOTIC ASSISTED  TOTAL HYSTERECTOMY WITH BILATERAL SALPINGO OOPHERECTOMY Bilateral 02/13/2017   Procedure: XI ROBOTIC ASSISTED TOTAL HYSTERECTOMY WITH BILATERAL SALPINGO OOPHORECTOMY WITH LYSIS OF ADHESIONS, UTERUS GREATER THAN 250GRAMS;  Surgeon: Paula Amber, MD;  Location: [facility];  Service: Gynecology;  Laterality: Bilateral;  . TUBAL LIGATION      SOCIAL HISTORY: Social History   Social History  . Marital status: Widowed    Spouse name: N/A  . Number of children: N/A  . Years of education: N/A   Occupational History  . Not on file.   Social History Main Topics  . Smoking status: Former Smoker    Packs/day: 1.50    Years: 40.00    Types: Cigarettes    Quit date: 08/01/2012  . Smokeless tobacco: Never Used  . Alcohol use No  . Drug use: No  . Sexual activity: Not Currently   Other Topics Concern  . Not on file   Social History Narrative  . No narrative on file    FAMILY HISTORY: Family History  Problem Relation Age of Onset  . Breast cancer Maternal Aunt     Review of Systems  Constitutional: Negative for appetite change, chills, fatigue and fever.  HENT:   Negative for hearing loss.   Eyes: Negative for eye problems and icterus.  Respiratory: Negative for chest tightness, cough and shortness of breath.   Cardiovascular: Negative for chest pain, leg swelling and palpitations.  Gastrointestinal: Negative for abdominal distention and abdominal pain.  Endocrine: Negative for hot flashes.  Musculoskeletal: Negative for arthralgias.  Skin: Negative for itching and rash.  Neurological: Positive for numbness. Negative for dizziness, extremity weakness and headaches.  Hematological: Negative for adenopathy. Does not bruise/bleed easily.  Psychiatric/Behavioral: Negative for depression. The patient is not nervous/anxious.       PHYSICAL EXAMINATION  ECOG PERFORMANCE STATUS: 2 - Symptomatic, <50% confined to bed  Vitals:   06/01/17 1320  BP: 135/88  Pulse: 85  Resp: 18  Temp:  98.7 F (37.1 C)  SpO2: 98%    Physical Exam  Constitutional:  She is oriented to person, place, and time and well-developed, well-nourished, and in no distress.  HENT:  Head: Normocephalic and atraumatic.  Mouth/Throat: Oropharynx is clear and moist. No oropharyngeal exudate.  Eyes: Pupils are equal, round, and reactive to light. No scleral icterus.  Neck: Neck supple.  Cardiovascular: Normal rate, regular rhythm and normal heart sounds.   Pulmonary/Chest: Effort normal and breath sounds normal. No respiratory distress. She has no wheezes. She has no rales.  See pictures in media tab  Abdominal: Soft. Bowel sounds are normal. She exhibits no distension. There is no tenderness. There is no rebound and no guarding.  Musculoskeletal: She exhibits no edema.  Lymphadenopathy:    She has no cervical adenopathy.  Neurological: She is alert and oriented to person, place, and time.  Skin: Skin is warm and dry.  See scalp lesion on media tab   Psychiatric: Mood and affect normal.    LABORATORY DATA:  CBC    Component Value Date/Time   WBC 8.8 06/01/2017 1232   WBC 12.9 (H) 02/14/2017 0612   RBC 4.74 06/01/2017 1232   RBC 4.15 02/14/2017 0612   HGB 9.8 (L) 06/01/2017 1232   HCT 31.0 (L) 06/01/2017 1232   PLT 325 06/01/2017 1232   MCV 65.3 (L) 06/01/2017 1232   MCH 20.6 (L) 06/01/2017 1232   MCH 20.7 (L) 02/14/2017 0612   MCHC 31.5 06/01/2017 1232   MCHC 32.0 02/14/2017 0612   RDW 19.3 (H) 06/01/2017 1232   LYMPHSABS 1.3 06/01/2017 1232   MONOABS 0.7 06/01/2017 1232   EOSABS 0.1 06/01/2017 1232   BASOSABS 0.0 06/01/2017 1232    CMP     Component Value Date/Time   NA 136 06/01/2017 1232   K 3.9 06/01/2017 1232   CL 100 (L) 02/14/2017 0612   CO2 29 06/01/2017 1232   GLUCOSE 99 06/01/2017 1232   BUN 16.7 06/01/2017 1232   CREATININE 0.7 06/01/2017 1232   CALCIUM 10.4 06/01/2017 1232   PROT 8.2 06/01/2017 1232   ALBUMIN 2.9 (L) 06/01/2017 1232   AST 32 06/01/2017 1232    ALT 14 06/01/2017 1232   ALKPHOS 70 06/01/2017 1232   BILITOT 0.65 06/01/2017 1232   GFRNONAA >60 02/14/2017 0612   GFRAA >60 02/14/2017 0612      ASSESSMENT and PLAN:   Bilateral breast cancer ([place]) Bilateral breast cancers are usually diagnosed 2015 treated with surgery, radiation, anastrozole Metastatic breast cancer diagnosed 05/28/2015 as subpectoral mass, ER 5%, PR 0%, HER-2 negative, failed Xeloda and carboplatin and gemcitabine  CT CAP04/17/2017:RP and retrocrural lymphadenopathy, large uterine fibroids with necrosis;  MRI Abd: RP lymphadenopathy CT chest 11/20/16: Worsening bulky conglomeration of mediastinal and right hilar lymph nodes, lung nodules subcentimeter size slight increase, left breast mass was 3.7 x 5.8 cm now 5.4 x 7.4 cm  Current treatment: 1. Halaven days 1 and 8 every 3 weeks completed 6 cycles (delayed due to surgery); will be restarted 05/25/2017  Due to her peripheral neuropathy, and the fact that it sounds like it is progressing and quite troublesome, we will hold chemo today, change to Lyrica, and she will f/u with Dr. Lindi Adie next week for evaluation, and planning.      All questions were answered. The patient knows to call the clinic with any problems, questions or concerns. We can certainly see the patient much sooner if necessary.  A total of (30) minutes of face-to-face time was spent with this patient with greater than 50% of that time in counseling  and care-coordination.  This note was electronically signed. Scot Dock, NP 06/02/2017

## 2017-06-01 NOTE — Pre-Procedure Instructions (Signed)
Scalp

## 2017-06-01 NOTE — Pre-Procedure Instructions (Signed)
Breast  

## 2017-06-02 ENCOUNTER — Encounter: Payer: Self-pay | Admitting: Adult Health

## 2017-06-04 ENCOUNTER — Other Ambulatory Visit: Payer: Self-pay

## 2017-06-04 ENCOUNTER — Telehealth: Payer: Self-pay

## 2017-06-04 ENCOUNTER — Ambulatory Visit: Payer: Medicare Other | Admitting: Adult Health

## 2017-06-04 MED ORDER — FENTANYL 25 MCG/HR TD PT72: 25.0000 ug | MEDICATED_PATCH | TRANSDERMAL | 0 refills | Status: DC

## 2017-06-04 NOTE — Telephone Encounter (Signed)
Pt called that she saw [name] on Friday who said she will call in fentanyl patches. Instructed her that this is an rx that needs to be picked up. she will be sending her son to pick it up.   She stated she took 2 lyrica on Saturday and they did not help so she stopped taking them. She is wanting to see someone or get set up for chemo for Friday.

## 2017-06-04 NOTE — Telephone Encounter (Signed)
Per NP: multiple los sent out for patient to see Dr. Lindi Adie before scheduling chemo this Friday.  NP spoke with patient at last visit about f/u with Dr. Lindi Adie. Was put on Lyrica, which NP says she will have to titrate.

## 2017-06-04 NOTE — Telephone Encounter (Signed)
Informed pt fentanyl patches prescription is available for pick up and can be filled on Wednesday 06/06/17. Also spoke with her regarding the lyrica. She only took 2 pills and stopped. I told her it could take 2 weeks to start working well but she did not like the way it made her feel. She stated it made her hands numb up to her wrists. She is also asking about starting chemotherapy. Pt has an apt on 06/15/17.  Cyndia Bent RN

## 2017-06-05 ENCOUNTER — Telehealth: Payer: Self-pay | Admitting: Hematology and Oncology

## 2017-06-05 ENCOUNTER — Telehealth: Payer: Self-pay

## 2017-06-05 NOTE — Telephone Encounter (Signed)
Trasnsportation request faxed to [facility] for Thursdays appt at 1:45.  Cyndia Bent RN

## 2017-06-05 NOTE — Telephone Encounter (Signed)
Scheduled appt per 11/5 sch message - patient is aware of appt and let Rn Tiffany to let her transportation know.

## 2017-06-07 ENCOUNTER — Ambulatory Visit: Payer: Medicare Other

## 2017-06-07 ENCOUNTER — Ambulatory Visit (HOSPITAL_BASED_OUTPATIENT_CLINIC_OR_DEPARTMENT_OTHER): Payer: Medicare Other | Admitting: Hematology and Oncology

## 2017-06-07 ENCOUNTER — Telehealth: Payer: Self-pay | Admitting: Hematology and Oncology

## 2017-06-07 ENCOUNTER — Telehealth: Payer: Self-pay

## 2017-06-07 ENCOUNTER — Ambulatory Visit (HOSPITAL_BASED_OUTPATIENT_CLINIC_OR_DEPARTMENT_OTHER): Payer: Medicare Other

## 2017-06-07 ENCOUNTER — Other Ambulatory Visit: Payer: Self-pay | Admitting: Hematology and Oncology

## 2017-06-07 DIAGNOSIS — C50911 Malignant neoplasm of unspecified site of right female breast: Secondary | ICD-10-CM

## 2017-06-07 DIAGNOSIS — C50011 Malignant neoplasm of nipple and areola, right female breast: Secondary | ICD-10-CM

## 2017-06-07 DIAGNOSIS — C50012 Malignant neoplasm of nipple and areola, left female breast: Secondary | ICD-10-CM | POA: Diagnosis not present

## 2017-06-07 DIAGNOSIS — C770 Secondary and unspecified malignant neoplasm of lymph nodes of head, face and neck: Secondary | ICD-10-CM

## 2017-06-07 DIAGNOSIS — C7951 Secondary malignant neoplasm of bone: Secondary | ICD-10-CM

## 2017-06-07 DIAGNOSIS — C50919 Malignant neoplasm of unspecified site of unspecified female breast: Secondary | ICD-10-CM

## 2017-06-07 DIAGNOSIS — C50912 Malignant neoplasm of unspecified site of left female breast: Secondary | ICD-10-CM

## 2017-06-07 DIAGNOSIS — Z170 Estrogen receptor positive status [ER+]: Secondary | ICD-10-CM

## 2017-06-07 MED ORDER — SODIUM CHLORIDE 0.9% FLUSH
10.0000 mL | INTRAVENOUS | Status: DC | PRN
  Administered 2017-06-07: 10 mL via INTRAVENOUS
  Filled 2017-06-07: qty 10

## 2017-06-07 MED ORDER — HEPARIN SOD (PORK) LOCK FLUSH 100 UNIT/ML IV SOLN
500.0000 [IU] | Freq: Once | INTRAVENOUS | Status: AC | PRN
  Administered 2017-06-07: 500 [IU] via INTRAVENOUS
  Filled 2017-06-07: qty 5

## 2017-06-07 NOTE — Assessment & Plan Note (Signed)
Bilateral breast cancers are usually diagnosed 2015 treated with surgery, radiation, anastrozole Metastatic breast cancer diagnosed 05/28/2015 as subpectoral mass, ER 5%, PR 0%, HER-2 negative, failed Xeloda and carboplatin and gemcitabine  CT CAP04/17/2017:RP and retrocrural lymphadenopathy, large uterine fibroids with necrosis;  MRI Abd: RP lymphadenopathy CT chest 11/20/16: Worsening bulky conglomeration of mediastinal and right hilar lymph nodes, lung nodules subcentimeter size slight increase, left breast mass was 3.7 x 5.8 cm now 5.4 x 7.4 cm  Current treatment: 1. Halaven days 1 and 8 every 3 weeks completed 6 cycles (delayed due to surgery); stopped due to worsening peripheral neuropathy  2. Treatment plan:  Send for foundation 1 testing, MSI as well as BRCA testing Obtain PET/CT scan for restaging We may try compassionate use pembrolizumab

## 2017-06-07 NOTE — Telephone Encounter (Signed)
Spoke with [facility] about needing the Nov 2016 breast path sent for Foundation 1 and MSI, they took pt information and stated they would get it taken care of.  Cyndia Bent RN

## 2017-06-07 NOTE — Telephone Encounter (Signed)
Pt requested document be faxed to [facility] for her appt today so they will give her a ride. Done.

## 2017-06-07 NOTE — Progress Notes (Signed)
Patient Care Team: [name], MD (Inactive) as PCP - General (Family Medicine) Rothbart, Cristopher Estimable, MD (Cardiology) [name], MD as Consulting Physician (Gastroenterology)  DIAGNOSIS:  Encounter Diagnosis  Name Primary?  . 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 N1 M0 stage IIB ER 100%, PR 0%, HER-2       06/17/2014 -  Radiation Therapy    Adjuvant radiation therapy      09/14/2014 -  Anti-estrogen oral therapy    Anastrozole 1 mg daily      05/28/2015 Imaging    CT scans: Enlarging subpectoral masses 3.1 x 3.5 cm, posteriorly lower density mass 4.5 x 2.1 cm, several right-sided lung nodules right lower lobe 1.4 cm, 3 other right lung nodules, 1.7 cm right iliac bone lesion      06/21/2015 Procedure    Left subpectoral mass biopsy: Invasive high-grade ductal carcinoma ER 5%, PR 0%, HER-2 negative ratio 1.29      09/01/2015 Imaging    Left chest wall mass increased in size 7.2 x 5.1 cm, multiple subcutaneous nodules, increase in the lung nodules both in number as well as in the size of existing nodules      09/10/2015 -  Chemotherapy    Carboplatin, gemcitabine days 1 and 8 q 3 weeks, carboplatin discontinued for neuropathy (treatment break from 01/20/2016 to 03/24/2016); Added Carboplatin back 04/14/16 (for progression)      01/26/2016 Imaging    Marked improvement in size of lung nodules with many of the nodules resolved index right lower lobe nodule 1.9 x 1.8 cm is now 0.7 x 0.6 cm, reduction in the size of left breast mass 7.2 cm down to 2.6 cm, enlargement in the hypodense mass in the uterus      04/06/2016 Imaging    CT chest: Interval progression of pre-existing lung nodules new lung metastases (37m, 114m 2.9 cm, 1.6 cm), interval progression of disease in the left breast 4.2 cm (was 2.6 cm), additional nodules 2.3 cm and 3.2 cm; CT head: scalp mass 1.9 cm      06/27/2016 Imaging    Ct chest: Stable lesions in lt breast deep to Left  pectoralis, cystic lesion loc ant in left breast inc compared to previous; decrease in lung nodules (61m to 3 mm; 12 mm to 7 mm, RML nodule 2.9 cm to 1.8 cm, RLL 176mto 7 mm)      11/14/2016 Imaging    CT CAP:RP and retrocrural lymphadenopathy, large uterine fibroids with necrosis; MRI Abd: RP lymphadenopathy CT chest 11/20/16: Worsening bulky conglomeration of mediastinal and right hilar lymph nodes, lung nodules subcentimeter size slight increase, left breast mass was 3.7 x 5.8 cm now 5.4 x 7.4 cm      12/08/2016 -  Chemotherapy    Palliative chemotherapy with Halaven days 1 and 8 every 3 weeks stopped on 1 2018, to be resumed 05/25/2017       04/30/2017 - 05/18/2017 Radiation Therapy    Palliative radiation therapy to the  scalp and the left chest wall lesion       Endometrial cancer ([facility]  02/13/2017 Surgery    Hysterectomy with BSO: 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  Because this the treatment is being discontinued.  In the meantime the tumor in the left chest wall is continuing to grow with a couple of areas of open sores that had discharge and foul-smelling material.  She is using dressing material to dress her wounds.  The scalp lesion appears to be small.  Neuropathy is bad enough that she is unable to drive.  REVIEW OF SYSTEMS:   Constitutional: Denies fevers, chills or abnormal weight loss Eyes: Denies blurriness of vision Ears, nose, mouth, throat, and face: Denies mucositis or sore throat Respiratory: Denies cough, dyspnea or wheezes Cardiovascular: Denies palpitation, chest discomfort Gastrointestinal:  Denies nausea, heartburn or change in bowel habits Skin: Denies abnormal skin rashes Lymphatics: Denies new lymphadenopathy or easy bruising Neurological: Neuropathy in hands and feet Behavioral/Psych: Mood is stable, no new changes  Extremities: No lower extremity edema Breast: Left chest wall lesion is significantly large and has open sores. All other systems were reviewed with the patient and are negative.  I have reviewed the past medical history, past surgical history, social history and family history with the patient and they are unchanged from previous note.  ALLERGIES:  is allergic to codeine and morphine and related.  MEDICATIONS:  Current Outpatient Medications  Medication Sig Dispense Refill  . B Complex-C (SUPER B COMPLEX PO) Take 1 tablet  by mouth daily.    . calcium-vitamin D (OSCAL-500) 500-400 MG-UNIT tablet Take 1 tablet by mouth 2 (two) times daily. (Patient taking differently: Take 1 tablet by mouth 2 (two) times daily. Pt takes once daily.) 60 tablet 3  . Clobetasol Prop Emollient Base (CLOBETASOL PROPIONATE E) 0.05 % emollient cream Apply 1 application topically 2 (two) times daily. 30 g 0  . dicyclomine (BENTYL) 10 MG capsule Take 1 capsule (10 mg total) by mouth 3 (three) times daily as needed for spasms. 30 capsule 3  . DILAUDID 2 MG tablet Take 1-2 tablets (2-4 mg total) by mouth every 4 (four) hours as needed for severe pain. 120 tablet 0  . docusate sodium (COLACE) 100 MG capsule Take 1 capsule (100 mg total) by mouth 2 (two) times daily. 10 capsule  0  . fentaNYL (DURAGESIC - DOSED MCG/HR) 25 MCG/HR patch Place 1 patch (25 mcg total) every 3 (three) days onto the skin. 10 patch 0  . gabapentin (NEURONTIN) 300 MG capsule TAKE 2 CAPSULES (600 MG TOTAL) BY MOUTH 3 (THREE) TIMES DAILY. 180 capsule 2  . hyaluronate sodium (RADIAPLEXRX) GEL Apply 1 application topically 2 (two) times daily. Apply toleft breast tumor after rad tx and bedtime daily    . ibuprofen (ADVIL,MOTRIN) 600 MG tablet Take 1 tablet (600 mg total) by mouth every 8 (eight) hours as needed (mild pain). 30 tablet 0  . LORazepam (ATIVAN) 1 MG tablet TAKE 1 TABLET BY MOUTH EVERYDAY AT BEDTIME 30 tablet 2  . nystatin (MYCOSTATIN) 100000 UNIT/ML suspension Take 5 mLs (500,000 Units total) by mouth 4 (four) times daily. 60 mL 0  . polyethylene glycol (MIRALAX / GLYCOLAX) packet Take 17 g by mouth 2 (two) times daily. 14 each 0  . pregabalin (LYRICA) 50 MG capsule Take 1 capsule (50 mg total) by mouth 2 (two) times daily. 60 capsule 0  . prochlorperazine (COMPAZINE) 10 MG tablet Take 1 tablet (10 mg total) by mouth every 6 (six) hours as needed (Nausea or vomiting). 30 tablet 1  . zolpidem (AMBIEN) 10 MG tablet Take 1 tablet (10 mg total) by mouth at bedtime. 30  tablet 2   No current facility-administered medications for this visit.     PHYSICAL EXAMINATION: ECOG PERFORMANCE STATUS: 1 - Symptomatic but completely ambulatory  Vitals:   06/07/17 1341  BP: 120/66  Pulse: 77  Resp: 18  Temp: 98.7 F (37.1 C)  SpO2: 100%   Filed Weights   06/07/17 1341  Weight: 104 lb 3.2 oz (47.3 kg)    GENERAL:alert, no distress and comfortable SKIN: skin color, texture, turgor are normal, no rashes or significant lesions EYES: normal, Conjunctiva are pink and non-injected, sclera clear OROPHARYNX:no exudate, no erythema and lips, buccal mucosa, and tongue normal  NECK: supple, thyroid normal size, non-tender, without nodularity LYMPH:  no palpable lymphadenopathy in the cervical, axillary or inguinal LUNGS: clear to auscultation and percussion with normal breathing effort HEART: regular rate & rhythm and no murmurs and no lower extremity edema ABDOMEN:abdomen soft, non-tender and normal bowel sounds MUSCULOSKELETAL:no cyanosis of digits and no clubbing  NEURO: Neuropathy in hands and feet EXTREMITIES: No lower extremity edema  LABORATORY DATA:  I have reviewed the data as listed   Chemistry      Component Value Date/Time   NA 136 06/01/2017 1232   K 3.9 06/01/2017 1232   CL 100 (L) 02/14/2017 0612   CO2 29 06/01/2017 1232   BUN 16.7 06/01/2017 1232   CREATININE 0.7 06/01/2017 1232      Component Value Date/Time   CALCIUM 10.4 06/01/2017 1232   ALKPHOS 70 06/01/2017 1232   AST 32 06/01/2017 1232   ALT 14 06/01/2017 1232   BILITOT 0.65 06/01/2017 1232       Lab Results  Component Value Date   WBC 8.8 06/01/2017   HGB 9.8 (L) 06/01/2017   HCT 31.0 (L) 06/01/2017   MCV 65.3 (L) 06/01/2017   PLT 325 06/01/2017   NEUTROABS 6.7 (H) 06/01/2017    ASSESSMENT & PLAN:  Bilateral breast cancer ([facility]) Bilateral breast cancers are usually diagnosed 2015 treated with surgery, radiation, anastrozole Metastatic breast cancer diagnosed  05/28/2015 as subpectoral mass, ER 5%, PR 0%, HER-2 negative, failed Xeloda and carboplatin and gemcitabine  CT CAP04/17/2017:RP and retrocrural lymphadenopathy, large uterine fibroids  with necrosis;  MRI Abd: RP lymphadenopathy CT chest 11/20/16: Worsening bulky conglomeration of mediastinal and right hilar lymph nodes, lung nodules subcentimeter size slight increase, left breast mass was 3.7 x 5.8 cm now 5.4 x 7.4 cm  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  Plan:  1. Send for foundation 1 testing, MSI as well as BRCA testing 2. Obtain PET/CT scan for restaging 3. We may try get compassionate use pembrolizumab  Return to clinic on 06/22/2017 to start pembrolizumab I spent 25 minutes talking to the patient of which more than half was spent in counseling and coordination of care.  Orders Placed This Encounter  Procedures  . NM PET Image Restag (PS) Skull Base To Thigh    Standing Status:   Future    Standing Expiration Date:   06/07/2018    Order Specific Question:   If indicated for the ordered procedure, I authorize the administration of a radiopharmaceutical per Radiology protocol    Answer:   Yes    Order Specific Question:   Preferred imaging location?    Answer:   [facility] [place]    Order Specific Question:   Radiology Contrast Protocol - do NOT remove file path    Answer:   [email or web address]   The patient has a good understanding of the overall plan. she agrees with it. she will call with any problems that may develop before the next visit here.   Rulon Eisenmenger, MD 06/07/17

## 2017-06-07 NOTE — Progress Notes (Signed)
DISCONTINUE ON PATHWAY REGIMEN - Breast     A cycle is every 21 days:     Eribulin mesylate   **Always confirm dose/schedule in your pharmacy ordering system**    REASON: Toxicities / Adverse Event PRIOR TREATMENT: [id number]: Eribulin 1.4 mg/m2 D1, 8 q21 Days Until Progression or Unacceptable Toxicity TREATMENT RESPONSE: Progressive Disease (PD)  START OFF PATHWAY REGIMEN - Breast   [id number]:Pembrolizumab 2 mg/kg q21 Days:   A cycle is every 21 days:     Pembrolizumab   **Always confirm dose/schedule in your pharmacy ordering system**    Patient Characteristics: Metastatic Chemotherapy, HER2 Negative/Unknown/Equivocal, ER Positive, [facility], Prior Eribulin Therapeutic Status: Distant Metastases BRCA Mutation Status: Absent ER Status: Positive (+) HER2 Status: Negative (-) Would you be surprised if this patient died  in the next year<= I would be surprised if this patient died in the next year PR Status: Negative (-) Line of therapy: [facility] Intent of Therapy: Non-Curative / Palliative Intent, Discussed with Patient

## 2017-06-07 NOTE — Telephone Encounter (Signed)
Per Dr.Gudena cancelled treatment on 11/16 and change treatment for 11/23

## 2017-06-11 ENCOUNTER — Emergency Department (HOSPITAL_COMMUNITY): Payer: Medicare Other

## 2017-06-11 ENCOUNTER — Telehealth: Payer: Self-pay | Admitting: Genetics

## 2017-06-11 ENCOUNTER — Encounter (HOSPITAL_COMMUNITY): Payer: Self-pay | Admitting: Cardiology

## 2017-06-11 ENCOUNTER — Emergency Department (HOSPITAL_COMMUNITY)
Admission: EM | Admit: 2017-06-11 | Discharge: 2017-06-11 | Disposition: A | Discharge status: Home / Self Care | Payer: Medicare Other | Attending: Emergency Medicine | Admitting: Emergency Medicine

## 2017-06-11 DIAGNOSIS — C799 Secondary malignant neoplasm of unspecified site: Secondary | ICD-10-CM | POA: Insufficient documentation

## 2017-06-11 DIAGNOSIS — Z79899 Other long term (current) drug therapy: Secondary | ICD-10-CM | POA: Diagnosis not present

## 2017-06-11 DIAGNOSIS — C770 Secondary and unspecified malignant neoplasm of lymph nodes of head, face and neck: Secondary | ICD-10-CM | POA: Diagnosis not present

## 2017-06-11 DIAGNOSIS — Z853 Personal history of malignant neoplasm of breast: Secondary | ICD-10-CM | POA: Insufficient documentation

## 2017-06-11 DIAGNOSIS — Z8542 Personal history of malignant neoplasm of other parts of uterus: Secondary | ICD-10-CM | POA: Diagnosis not present

## 2017-06-11 DIAGNOSIS — Z87891 Personal history of nicotine dependence: Secondary | ICD-10-CM | POA: Insufficient documentation

## 2017-06-11 DIAGNOSIS — C7800 Secondary malignant neoplasm of unspecified lung: Secondary | ICD-10-CM | POA: Diagnosis not present

## 2017-06-11 DIAGNOSIS — C7951 Secondary malignant neoplasm of bone: Secondary | ICD-10-CM | POA: Diagnosis not present

## 2017-06-11 DIAGNOSIS — J449 Chronic obstructive pulmonary disease, unspecified: Secondary | ICD-10-CM | POA: Diagnosis not present

## 2017-06-11 DIAGNOSIS — M545 Low back pain: Secondary | ICD-10-CM | POA: Diagnosis present

## 2017-06-11 LAB — COMPREHENSIVE METABOLIC PANEL
ALBUMIN: 3.1 g/dL — AB (ref 3.5–5.0)
ALT: 17 U/L (ref 14–54)
AST: 35 U/L (ref 15–41)
Alkaline Phosphatase: 59 U/L (ref 38–126)
Anion gap: 7 (ref 5–15)
BUN: 19 mg/dL (ref 6–20)
CHLORIDE: 101 mmol/L (ref 101–111)
CO2: 28 mmol/L (ref 22–32)
CREATININE: 0.62 mg/dL (ref 0.44–1.00)
Calcium: 9.6 mg/dL (ref 8.9–10.3)
GFR calc Af Amer: >60 mL/min (ref >60)
GFR calc non Af Amer: >60 mL/min (ref >60)
GLUCOSE: 89 mg/dL (ref 65–99)
POTASSIUM: 3.9 mmol/L (ref 3.5–5.1)
SODIUM: 136 mmol/L (ref 135–145)
Total Bilirubin: 0.7 mg/dL (ref 0.3–1.2)
Total Protein: 7.7 g/dL (ref 6.5–8.1)

## 2017-06-11 LAB — URINALYSIS, ROUTINE W REFLEX MICROSCOPIC
BACTERIA UA: NONE SEEN
Bilirubin Urine: NEGATIVE
Glucose, UA: NEGATIVE mg/dL
Hgb urine dipstick: NEGATIVE
Ketones, ur: NEGATIVE mg/dL
Nitrite: NEGATIVE
PROTEIN: NEGATIVE mg/dL
Specific Gravity, Urine: 1.012 (ref 1.005–1.030)
pH: 5 (ref 5.0–8.0)

## 2017-06-11 LAB — CBC WITH DIFFERENTIAL/PLATELET
Basophils Absolute: 0 10*3/uL (ref 0.0–0.1)
Basophils Relative: 0 %
EOS PCT: 2 %
Eosinophils Absolute: 0.1 10*3/uL (ref 0.0–0.7)
HCT: 28.2 % — ABNORMAL LOW (ref 36.0–46.0)
HEMOGLOBIN: 8.9 g/dL — AB (ref 12.0–15.0)
LYMPHS ABS: 1.8 10*3/uL (ref 0.7–4.0)
LYMPHS PCT: 19 %
MCH: 21 pg — AB (ref 26.0–34.0)
MCHC: 31.6 g/dL (ref 30.0–36.0)
MCV: 66.5 fL — AB (ref 78.0–100.0)
MONOS PCT: 6 %
Monocytes Absolute: 0.6 10*3/uL (ref 0.1–1.0)
Neutro Abs: 6.8 10*3/uL (ref 1.7–7.7)
Neutrophils Relative %: 73 %
PLATELETS: 288 10*3/uL (ref 150–400)
RBC: 4.24 MIL/uL (ref 3.87–5.11)
RDW: 18.4 % — AB (ref 11.5–15.5)
WBC: 9.3 10*3/uL (ref 4.0–10.5)

## 2017-06-11 MED ORDER — HEPARIN SOD (PORK) LOCK FLUSH 100 UNIT/ML IV SOLN
INTRAVENOUS | Status: AC
  Administered 2017-06-11: 500 [IU]
  Filled 2017-06-11: qty 5

## 2017-06-11 MED ORDER — HYDROMORPHONE HCL 2 MG/ML IJ SOLN
2.0000 mg | Freq: Once | INTRAMUSCULAR | Status: AC
  Administered 2017-06-11: 2 mg via INTRAMUSCULAR
  Filled 2017-06-11: qty 1

## 2017-06-11 MED ORDER — ACETAMINOPHEN 325 MG PO TABS
650.0000 mg | ORAL_TABLET | Freq: Once | ORAL | Status: AC
  Administered 2017-06-11: 650 mg via ORAL
  Filled 2017-06-11: qty 2

## 2017-06-11 NOTE — ED Notes (Signed)
Pt c/o lower back pain and abdominal pain following hysterectomy. Upon assessment, Pt has external tumor growth on scalp, and drainage from second tumor on left chest.

## 2017-06-11 NOTE — ED Provider Notes (Signed)
[facility] EMERGENCY DEPARTMENT Provider Note   CSN: [id number] Arrival date & time: 06/11/17  1449     History   Chief Complaint Chief Complaint  Patient presents with  . Back Pain    HPI Paula Navarro is a 66 y.o. female.  HPI Patient with known metastatic breast cancer on palliative care presents with right flank pain starting acutely yesterday.  States the pain radiated to her left flank and then down to her right thigh.  No fever or chills.  No vomiting, diarrhea or constipation.  Patient states she is been using her fentanyl patches, p.o. Dilaudid and Tylenol for pain.  Denies urinary frequency, urgency or hematuria.  No focal weakness or numbness. Past Medical History:  Diagnosis Date  . Anemia   . Anxiety   . Atrial septal defect 1996   Surgical repair in 1996  . Breast cancer ([place]) dx'd 06/2013  . Chest pain    Admitted to [facility] in 09/2011; refused stress test  . Chronic bronchitis   . Chronic pain   . COPD (chronic obstructive pulmonary disease) ([place])    on xray  . Metastatic cancer to lung ([place]) dx'd 2017  . Palpitation    Tachycardia reported by monitor clerk during a symptomatic spell  . Radiation 06/30/14-08/17/14   Bilateral Breast  . Tobacco abuse    60 pack years; 1.5 packs per day  . Wears dentures    top    Patient Active Problem List   Diagnosis Date Noted  . Acute post-operative pain 05/10/2017  . Endometrial cancer ([place]) 12/18/2016  . Protein-calorie malnutrition, severe 11/15/2016  . UTI (urinary tract infection) 11/14/2016  . COPD (chronic obstructive pulmonary disease) ([place])   . 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 Chemotherapy-induced thrombocytopenia 06/13/2016  . Scalp lesion 04/01/2016  . Encounter for central line care 12/08/2015  . Port catheter in place 11/19/2015  . Insomnia 11/01/2015  . Microcytic anemia 09/28/2015  . Lung metastases ([place]) 09/10/2015  . Bone metastases ([place]) 07/20/2015  . Goals of  care, counseling/discussion 07/13/2015  . Metastatic breast cancer ([place]) 06/29/2015  . Chronic pain 03/16/2015  . Vaginal bleeding 09/24/2014  . Postherpetic neuralgia 09/03/2014  . Lymphedema 09/03/2014  . Suspected herpes zoster left C5 distribution 08/14/2014  . 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 Anxiety 03/03/2014  . Bilateral breast cancer ([place]) 08/11/2013  . Palpitation   . Chest pain   . Atrial septal defect   . Laboratory test 11/25/2011  . Chronic bronchitis   . Tobacco abuse     Past Surgical History:  Procedure Laterality Date  . ASD REPAIR, OSTIUM PRIMUM  1996   dr Roxy Horseman  . BREAST BIOPSY Bilateral   . CESAREAN SECTION     x3  . CHOLECYSTECTOMY    . open heart surgery    . PORT A CATH REVISION  1/15   put in   . TUBAL LIGATION      OB History    Gravida Para Term Preterm AB Living   4 3 3          SAB TAB Ectopic Multiple Live Births                   Home Medications    Prior to Admission medications   Medication Sig Start Date End Date Taking? Authorizing Provider  B Complex-C (SUPER B COMPLEX PO) Take 1 tablet by mouth daily.    [provider]  calcium-vitamin D (OSCAL-500) 500-400 MG-UNIT tablet Take 1 tablet by mouth 2 (two) times daily. Patient taking differently: Take 1 tablet by mouth 2 (two) times daily. Pt takes once daily. 08/18/15   Nicholas Lose, MD  Clobetasol Prop Emollient Base (CLOBETASOL PROPIONATE E) 0.05 % emollient cream Apply 1 application topically 2 (two) times daily. 01/10/17   Nicholas Lose, MD  dicyclomine (BENTYL) 10 MG capsule Take 1 capsule (10 mg total) by mouth 3 (three) times daily as needed for spasms. 06/01/17   Nicholas Lose, MD  DILAUDID 2 MG tablet Take 1-2 tablets (2-4 mg total) by mouth every 4 (four) hours as needed for severe pain. 05/10/17   Hayden Pedro, PA-C  docusate sodium (COLACE) 100 MG capsule Take 1 capsule (100 mg total) by mouth 2 (two) times daily. 04/06/17    Gardenia Phlegm, NP  fentaNYL (DURAGESIC - DOSED MCG/HR) 25 MCG/HR patch Place 1 patch (25 mcg total) every 3 (three) days onto the skin. 06/06/17   Nicholas Lose, MD  gabapentin (NEURONTIN) 300 MG capsule TAKE 2 CAPSULES (600 MG TOTAL) BY MOUTH 3 (THREE) TIMES DAILY. 05/24/17   Nicholas Lose, MD  hyaluronate sodium (RADIAPLEXRX) GEL Apply 1 application topically 2 (two) times daily. Apply toleft breast tumor after rad tx and bedtime daily 04/24/17   Hayden Pedro, PA-C  ibuprofen (ADVIL,MOTRIN) 600 MG tablet Take 1 tablet (600 mg total) by mouth every 8 (eight) hours as needed (mild pain). 02/14/17   Everitt Amber, MD  LORazepam (ATIVAN) 1 MG tablet TAKE 1 TABLET BY MOUTH EVERYDAY AT BEDTIME 05/18/17   Nicholas Lose, MD  nystatin (MYCOSTATIN) 100000 UNIT/ML suspension Take 5 mLs (500,000 Units total) by mouth 4 (four) times daily. 01/26/17   Causey, Charlestine Massed, NP  polyethylene glycol (MIRALAX / GLYCOLAX) packet Take 17 g by mouth 2 (two) times daily. 04/06/17   Gardenia Phlegm, NP  pregabalin (LYRICA) 50 MG capsule Take 1 capsule (50 mg total) by mouth 2 (two) times daily. 06/01/17   Gardenia Phlegm, NP  prochlorperazine (COMPAZINE) 10 MG tablet Take 1 tablet (10 mg total) by mouth every 6 (six) hours as needed (Nausea or vomiting). 03/30/17   Gardenia Phlegm, NP  zolpidem (AMBIEN) 10 MG tablet Take 1 tablet (10 mg total) by mouth at bedtime. 06/01/17   Nicholas Lose, MD    Family History Family History  Problem Relation Age of Onset  . Breast cancer Maternal Aunt     Social History Social History   Tobacco Use  . Smoking status: Former Smoker    Packs/day: 1.50    Years: 40.00    Pack years: 60.00    Types: Cigarettes    Last attempt to quit: 08/01/2012    Years since quitting: 4.8  . Smokeless tobacco: Never Used  Substance Use Topics  . Alcohol use: No  . Drug use: No     Allergies   Codeine and Morphine and related   Review of  Systems Review of Systems  Constitutional: Negative for chills and fever.  Respiratory: Negative for cough and shortness of breath.   Cardiovascular: Negative for chest pain.  Gastrointestinal: Negative for abdominal pain, diarrhea, nausea and vomiting.  Genitourinary: Positive for flank pain. Negative for dysuria, frequency and hematuria.  Musculoskeletal: Positive for back pain and myalgias. Negative for neck pain and neck stiffness.  Skin: Negative for rash and wound.  Neurological: Negative for dizziness, weakness, light-headedness, numbness and headaches.  All other systems reviewed and are  negative.    Physical Exam Updated Vital Signs BP 124/78 (BP Location: Right Arm)   Pulse 86   Temp 98.6 F (37 C) (Oral)   Resp 16   Ht 5\' 2"  (1.575 m)   Wt 47.2 kg (104 lb)   LMP 05/20/2011   SpO2 98%   BMI 19.02 kg/m   Physical Exam  Constitutional: She is oriented to person, place, and time. She appears well-developed and well-nourished. No distress.  HENT:  Head: Normocephalic and atraumatic.  Mouth/Throat: Oropharynx is clear and moist. No oropharyngeal exudate.  Eyes: EOM are normal. Pupils are equal, round, and reactive to light.  Neck: Normal range of motion. Neck supple.  Cardiovascular: Normal rate and regular rhythm.  Pulmonary/Chest: Effort normal and breath sounds normal.  Abdominal: Soft. Bowel sounds are normal. There is no tenderness. There is no rebound and no guarding.  Musculoskeletal: Normal range of motion. She exhibits tenderness. She exhibits no edema.  Patient has mild right flank tenderness to percussion.  No definite midline thoracic or lumbar tenderness.  No lower extremity swelling or asymmetry.  Distal pulses are 2+.  Negative straight leg raise bilaterally.  Neurological: She is alert and oriented to person, place, and time.  5/5 motor in all extremities.  Sensation is fully intact.  Skin: Skin is warm and dry. Capillary refill takes less than 2  seconds. No rash noted. No erythema.  Psychiatric: She has a normal mood and affect. Her behavior is normal.  Nursing note and vitals reviewed.    ED Treatments / Results  Labs (all labs ordered are listed, but only abnormal results are displayed) Labs Reviewed  CBC WITH DIFFERENTIAL/PLATELET - Abnormal; Notable for the following components:      Result Value   Hemoglobin 8.9 (*)    HCT 28.2 (*)    MCV 66.5 (*)    MCH 21.0 (*)    RDW 18.4 (*)    All other components within normal limits  COMPREHENSIVE METABOLIC PANEL - Abnormal; Notable for the following components:   Albumin 3.1 (*)    All other components within normal limits  URINALYSIS, ROUTINE W REFLEX MICROSCOPIC - Abnormal; Notable for the following components:   APPearance HAZY (*)    Leukocytes, UA TRACE (*)    Squamous Epithelial / LPF 0-5 (*)    All other components within normal limits    EKG  EKG Interpretation None       Radiology Ct Renal Stone Study  Result Date: 06/11/2017 CLINICAL DATA:  Low back pain EXAM: CT ABDOMEN AND PELVIS WITHOUT CONTRAST TECHNIQUE: Multidetector CT imaging of the abdomen and pelvis was performed following the standard protocol without IV contrast. COMPARISON:  02/05/2017, 11/14/2016 FINDINGS: Lower chest: Partially visualized 15 mm right lower lobe pulmonary mass, series 4, image number 1, not seen on prior. Increased subpleural left lower lobe pulmonary nodule measuring 6 mm, series 4, image number 1. Increased pleural based right middle lobe pulmonary nodules, the largest measures 15 mm, series 4, image number 5. Diffuse skin thickening of both breasts. Hepatobiliary: Stable appearance of intra and extrahepatic biliary dilatation post cholecystectomy. Limited assessment for focal abnormality in the liver are without contrast. Pancreas: Unremarkable. No pancreatic ductal dilatation or surrounding inflammatory changes. Spleen: Normal in size without focal abnormality. Adrenals/Urinary  Tract: Adrenal glands are within normal limits. Kidneys show no hydronephrosis or stone. The bladder is unremarkable Stomach/Bowel: The stomach is nonenlarged. No dilated small bowel. No colon wall thickening. Vascular/Lymphatic: Nonaneurysmal aorta. Scattered atherosclerotic  calcification. Right retroperitoneal/ periaortic mass measuring 6.6 x 5.8 cm, new or increased compared to prior. Increased right iliac soft tissue mass, difficult to separate from adjacent iliopsoas muscle, measures approximately 2.3 cm. Suspected right external iliac adenopathy measuring 12 mm. Reproductive: Interval hysterectomy Other: Negative for free air or free fluid. New soft tissue nodule in the left upper quadrant measuring 13 mm, anterior to the distal transverse colon. Musculoskeletal: No acute or suspicious bone lesion. IMPRESSION: 1. Negative for nephrolithiasis, hydronephrosis or ureteral stone 2. Increased size of multiple right middle lobe pleural based nodules with new 15 mm right lower lobe pulmonary nodule and increased left lower lobe subpleural pulmonary nodule, consistent with progression of metastatic disease. 3. New or increased bulky right periaortic mass, also consistent with metastatic disease. Increased right common iliac adenopathy with mass lesion difficult to separate from adjacent psoas muscle. Increased right external iliac adenopathy. 4. New 15 mm soft tissue nodule in the left upper quadrant, anterior to the distal transverse colon, also consistent with metastatic disease 5. Interval hysterectomy Electronically Signed   By: Donavan Foil M.D.   On: 06/11/2017 18:21    Procedures Procedures (including critical care time)  Medications Ordered in ED Medications  HYDROmorphone (DILAUDID) injection 2 mg (2 mg Intramuscular Given 06/11/17 1908)  acetaminophen (TYLENOL) tablet 650 mg (650 mg Oral Given 06/11/17 1908)     Initial Impression / Assessment and Plan / ED Course  I have reviewed the triage  vital signs and the nursing notes.  Pertinent labs & imaging results that were available during my care of the patient were reviewed by me and considered in my medical decision making (see chart for details).    New periaortic mass on CT.  Likely contributing to patient's back pain.  Overall progression of her metastatic disease.  Will treat symptomatically.  Advised to make appointment to follow-up with her oncologist for pain medication adjustment as needed.   Final Clinical Impressions(s) / ED Diagnoses   Final diagnoses:  Metastatic disease [facility])    ED Discharge Orders    None       Julianne Rice, MD 06/11/17 1920

## 2017-06-11 NOTE — ED Notes (Signed)
No answer when called to treatment room.

## 2017-06-11 NOTE — ED Triage Notes (Signed)
Lower back pain since yesterday.  C/o lower abdominal pain,  States she had hysterectomy one month ago and feels the abdominal pain is from the surgery.

## 2017-06-12 ENCOUNTER — Other Ambulatory Visit: Payer: Self-pay

## 2017-06-12 ENCOUNTER — Telehealth: Payer: Self-pay

## 2017-06-12 DIAGNOSIS — G8918 Other acute postprocedural pain: Secondary | ICD-10-CM

## 2017-06-12 MED ORDER — HYDROMORPHONE HCL 4 MG PO TABS: 4.0000 mg | ORAL_TABLET | ORAL | 0 refills | Status: DC | PRN

## 2017-06-12 NOTE — Telephone Encounter (Signed)
Requesting hydromorphone refill  Went to ER yesterday d/t sharp low back pains. A CT was done that "showed a tumor in stomach pressing against back". ER MD suggested possibly stronger pain meds or XRT to the tumor.   She mentioned that her 11/16 appt was cancelled. It is still on the chart.

## 2017-06-12 NOTE — Telephone Encounter (Signed)
Called pt and confirmed her appt with Linsey,NP on Friday. Pt requesting increased dose on dilaudid, since it had helped relieve pain when she was in the ED. Will discuss with Dr.Gudena and see if we can increase her dilaudid dose. Pt son will be coming tomorrow to pick up her prescription.

## 2017-06-15 ENCOUNTER — Ambulatory Visit (HOSPITAL_BASED_OUTPATIENT_CLINIC_OR_DEPARTMENT_OTHER): Payer: Medicare Other | Admitting: Adult Health

## 2017-06-15 ENCOUNTER — Ambulatory Visit (HOSPITAL_BASED_OUTPATIENT_CLINIC_OR_DEPARTMENT_OTHER): Payer: Medicare Other

## 2017-06-15 ENCOUNTER — Encounter: Payer: Medicare Other | Admitting: Nutrition

## 2017-06-15 ENCOUNTER — Ambulatory Visit: Payer: Medicare Other

## 2017-06-15 ENCOUNTER — Ambulatory Visit: Payer: Medicare Other | Admitting: Adult Health

## 2017-06-15 ENCOUNTER — Other Ambulatory Visit (HOSPITAL_BASED_OUTPATIENT_CLINIC_OR_DEPARTMENT_OTHER): Payer: Medicare Other

## 2017-06-15 ENCOUNTER — Encounter: Payer: Self-pay | Admitting: Adult Health

## 2017-06-15 VITALS — BP 106/83 | HR 87 | Temp 98.5°F | Resp 18 | Ht 62.0 in | Wt 105.9 lb

## 2017-06-15 DIAGNOSIS — C50011 Malignant neoplasm of nipple and areola, right female breast: Secondary | ICD-10-CM | POA: Diagnosis not present

## 2017-06-15 DIAGNOSIS — C7951 Secondary malignant neoplasm of bone: Secondary | ICD-10-CM

## 2017-06-15 DIAGNOSIS — C50012 Malignant neoplasm of nipple and areola, left female breast: Secondary | ICD-10-CM | POA: Diagnosis not present

## 2017-06-15 DIAGNOSIS — C770 Secondary and unspecified malignant neoplasm of lymph nodes of head, face and neck: Secondary | ICD-10-CM

## 2017-06-15 DIAGNOSIS — Z170 Estrogen receptor positive status [ER+]: Secondary | ICD-10-CM

## 2017-06-15 DIAGNOSIS — C50912 Malignant neoplasm of unspecified site of left female breast: Secondary | ICD-10-CM

## 2017-06-15 DIAGNOSIS — C50919 Malignant neoplasm of unspecified site of unspecified female breast: Secondary | ICD-10-CM

## 2017-06-15 DIAGNOSIS — C7800 Secondary malignant neoplasm of unspecified lung: Secondary | ICD-10-CM | POA: Diagnosis not present

## 2017-06-15 DIAGNOSIS — C50911 Malignant neoplasm of unspecified site of right female breast: Secondary | ICD-10-CM

## 2017-06-15 LAB — COMPREHENSIVE METABOLIC PANEL
ALBUMIN: 2.7 g/dL — AB (ref 3.5–5.0)
ALK PHOS: 62 U/L (ref 40–150)
ALT: 14 U/L (ref 0–55)
ANION GAP: 9 meq/L (ref 3–11)
AST: 29 U/L (ref 5–34)
BILIRUBIN TOTAL: 0.34 mg/dL (ref 0.20–1.20)
BUN: 14.4 mg/dL (ref 7.0–26.0)
CALCIUM: 9.6 mg/dL (ref 8.4–10.4)
CO2: 27 mEq/L (ref 22–29)
CREATININE: 0.7 mg/dL (ref 0.6–1.1)
Chloride: 98 mEq/L (ref 98–109)
EGFR: >60 mL/min/{1.73_m2} (ref >60)
Glucose: 94 mg/dl (ref 70–140)
Potassium: 3.9 mEq/L (ref 3.5–5.1)
Sodium: 134 mEq/L — ABNORMAL LOW (ref 136–145)
TOTAL PROTEIN: 7.2 g/dL (ref 6.4–8.3)

## 2017-06-15 LAB — CBC WITH DIFFERENTIAL/PLATELET
BASO%: 0.5 % (ref 0.0–2.0)
Basophils Absolute: 0 10*3/uL (ref 0.0–0.1)
EOS%: 1.1 % (ref 0.0–7.0)
Eosinophils Absolute: 0.1 10*3/uL (ref 0.0–0.5)
HEMATOCRIT: 25.6 % — AB (ref 34.8–46.6)
HEMOGLOBIN: 8.1 g/dL — AB (ref 11.6–15.9)
LYMPH#: 1.4 10*3/uL (ref 0.9–3.3)
LYMPH%: 15.7 % (ref 14.0–49.7)
MCH: 21 pg — ABNORMAL LOW (ref 25.1–34.0)
MCHC: 31.8 g/dL (ref 31.5–36.0)
MCV: 65.9 fL — ABNORMAL LOW (ref 79.5–101.0)
MONO#: 0.6 10*3/uL (ref 0.1–0.9)
MONO%: 6.6 % (ref 0.0–14.0)
NEUT#: 6.8 10*3/uL — ABNORMAL HIGH (ref 1.5–6.5)
NEUT%: 76.1 % (ref 38.4–76.8)
PLATELETS: 305 10*3/uL (ref 145–400)
RBC: 3.88 10*6/uL (ref 3.70–5.45)
RDW: 19.9 % — AB (ref 11.2–14.5)
WBC: 8.9 10*3/uL (ref 3.9–10.3)

## 2017-06-15 MED ORDER — SODIUM CHLORIDE 0.9% FLUSH
10.0000 mL | INTRAVENOUS | Status: DC | PRN
  Administered 2017-06-15: 10 mL via INTRAVENOUS
  Filled 2017-06-15: qty 10

## 2017-06-15 MED ORDER — CLOBETASOL PROP EMOLLIENT BASE 0.05 % EX CREA: 1.0000 "application " | TOPICAL_CREAM | Freq: Two times a day (BID) | CUTANEOUS | 0 refills | Status: DC

## 2017-06-15 MED ORDER — HEPARIN SOD (PORK) LOCK FLUSH 100 UNIT/ML IV SOLN
500.0000 [IU] | Freq: Once | INTRAVENOUS | Status: AC | PRN
  Administered 2017-06-15: 500 [IU] via INTRAVENOUS
  Filled 2017-06-15: qty 5

## 2017-06-15 MED FILL — CLOBETASOL EMOLLIENT 0.05%: 0.05 | 10 days supply | Qty: 30 | Fill #0

## 2017-06-15 NOTE — Assessment & Plan Note (Addendum)
Bilateral breast cancers are usually diagnosed 2015 treated with surgery, radiation, anastrozole Metastatic breast cancer diagnosed 05/28/2015 as subpectoral mass, ER 5%, PR 0%, HER-2 negative, failed Xeloda and carboplatin and gemcitabine  CT CAP04/17/2017:RP and retrocrural lymphadenopathy, large uterine fibroids with necrosis;  MRI Abd: RP lymphadenopathy CT chest 11/20/16: Worsening bulky conglomeration of mediastinal and right hilar lymph nodes, lung nodules subcentimeter size slight increase, left breast mass was 3.7 x 5.8 cm now 5.4 x 7.4 cm CT scan on 11/12 indicates progression.    Treatment: 1. [place] days 1 and 8 every 3 weeks completed 6 cycles (delayed due to surgery); stopped due to worsening peripheral neuropathy  2. Treatment plan: Patient will undergo PET scan on 11/23 followed by appointment for labs, evaluation and to start Pembrolizumab.  Foundation 1 testing, MSI testing, and BRCA testing is pending, and will not be back for a couple of weeks.  IT was explained to [name] that this is one of the final treatments we have to offer her, and the concern is high that this cancer will cause her life to end soon.  She verbalized understanding of this.  The details of Pembrolizumab were explained to her in detail and she received a detailed summary of this in her AVS.  Dr. Lindi Adie saw Paula Navarro today as well and formulated the above plan and discussed it with her and her niece April in detail.

## 2017-06-15 NOTE — Progress Notes (Addendum)
[facility] Follow up:    Paula Navarro, [place] [address]   DIAGNOSIS: Cancer Staging Bilateral breast cancer [facility]) Staging form: Breast, AJCC 7th Edition - Clinical: Stage IIB (T2, N1, cM0) - Unsigned Staging comments: Staged at breast conference 08/13/13  - Pathologic: Stage IV (M1) - Unsigned  Endometrial cancer ([place]) Staging form: Corpus Uteri - Carcinoma and Carcinosarcoma, AJCC 8th Edition - Pathologic stage from 02/13/2017: FIGO Stage IIIA, calculated as Stage Unknown (pT3a, pNX, cM0) - Signed by Everitt Amber, MD on 03/14/2017 - Clinical: No stage assigned - Unsigned   SUMMARY OF ONCOLOGIC HISTORY:   Bilateral breast cancer ([place])   07/23/2013 Mammogram    Bilateral breast masses. 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 N1 M0 stage IIB ER 100%, PR 0%, HER-2       06/17/2014 -  Radiation Therapy    Adjuvant radiation therapy      09/14/2014 -  Anti-estrogen oral  therapy    Anastrozole 1 mg daily      05/28/2015 Imaging    CT scans: Enlarging subpectoral masses 3.1 x 3.5 cm, posteriorly lower density mass 4.5 x 2.1 cm, several right-sided lung nodules right lower lobe 1.4 cm, 3 other right lung nodules, 1.7 cm right iliac bone lesion      06/21/2015 Procedure    Left subpectoral mass biopsy: Invasive high-grade ductal carcinoma ER 5%, PR 0%, HER-2 negative ratio 1.29      09/01/2015 Imaging    Left chest wall mass increased in size 7.2 x 5.1 cm, multiple subcutaneous nodules, increase in the lung nodules both in number as well as in the size of existing nodules      09/10/2015 -  Chemotherapy    Carboplatin, gemcitabine days 1 and 8 q 3 weeks, carboplatin discontinued for neuropathy (treatment break from 01/20/2016 to 03/24/2016); Added Carboplatin back 04/14/16 (for progression)      01/26/2016 Imaging    Marked improvement in size of lung nodules with many of the nodules resolved index right lower lobe nodule 1.9 x 1.8 cm is now 0.7 x 0.6 cm, reduction in the size of left breast mass 7.2 cm down to 2.6 cm, enlargement in the hypodense mass in the uterus      04/06/2016 Imaging    CT chest: Interval progression of pre-existing lung nodules new lung metastases (  51m, 138m 2.9 cm, 1.6 cm), interval progression of disease in the left breast 4.2 cm (was 2.6 cm), additional nodules 2.3 cm and 3.2 cm; CT head: scalp mass 1.9 cm      06/27/2016 Imaging    Ct chest: Stable lesions in lt breast deep to Left pectoralis, cystic lesion loc ant in left breast inc compared to previous; decrease in lung nodules (13m513mo 3 mm; 12 mm to 7 mm, RML nodule 2.9 cm to 1.8 cm, RLL 60m29m 7 mm)      11/14/2016 Imaging    CT CAP:RP and retrocrural lymphadenopathy, large uterine fibroids with necrosis; MRI Abd: RP lymphadenopathy CT chest 11/20/16: Worsening bulky conglomeration of mediastinal and right hilar lymph nodes, lung nodules subcentimeter size slight increase, left  breast mass was 3.7 x 5.8 cm now 5.4 x 7.4 cm      12/08/2016 - 04/06/2017 Chemotherapy    Palliative chemotherapy with Halaven days 1 and 8 every 3 weeks stopped due to peripheral neuropathy       04/30/2017 - 05/18/2017 Radiation Therapy    Palliative radiation therapy to the scalp and the left chest wall lesion      06/11/2017 Imaging    Progression noted with new and progressing pulmonary nodules, increasing size of periaortic mass, increasing iliac adenopathy with mass lesion difficult to separate from adjacent psoas muscle, new 15mm54mule in the LUQ anterior to the colon.         Endometrial cancer (HCC) [place]/17/2018 Surgery    Hysterectomy with BSO: HG Serous carcinoma 7.7 cm (>50% Myometrial inv)involving Uterus and Bilateral ovaries, Omentum: No carcinoma; T3a (FIGO stage IIIa)       CURRENT THERAPY: To start Pembrolizumab on 06/22/2017  INTERVAL HISTORY: Paula Navarro.44 female returns for evaluation and follow up of her metastatic breast and uterine cancer.  She was in the ER for back pain on 11/12 and CT scan was consistent with progression of disease in multiple locations (see above).  She is here with her niece today to discuss further treatment options.  She says her pain is under better control with Dilaudid.     Patient Active Problem List   Diagnosis Date Noted  . Acute post-operative pain 05/10/2017  . Endometrial cancer (HCC) [place]21/2018  . Protein-calorie malnutrition, severe 11/15/2016  . UTI (urinary tract infection) 11/14/2016  . 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 Chemotherapy-induced thrombocytopenia 06/13/2016  . Scalp lesion 04/01/2016  . Encounter for central line care 12/08/2015  . Port catheter in place 11/19/2015  . Insomnia 11/01/2015  . Microcytic anemia 09/28/2015  . Lung metastases (HCC) [place]10/2017  . Bone metastases (HCC) [place]20/2016  . Goals of care,  counseling/discussion 07/13/2015  . Metastatic breast cancer (HCC) [place]29/2016  . Chronic pain 03/16/2015  . Vaginal bleeding 09/24/2014  . Postherpetic neuralgia 09/03/2014  . Lymphedema 09/03/2014  . Suspected herpes zoster left C5 distribution 08/14/2014  . Neuropathy due to chemotherapeutic drug (HCC) [place]04/2015  . Anxiety 03/03/2014  . Bilateral breast cancer (HCC) [place]12/2015  . Palpitation   . Chest pain   . Atrial septal defect   . Laboratory test 11/25/2011  . Chronic bronchitis   . Tobacco abuse     is allergic to codeine and morphine and related.  MEDICAL HISTORY: Past Medical History:  Diagnosis Date  . Anemia   . Anxiety   . Atrial septal defect 1996  Surgical repair in 1996  . Breast cancer ([place]) dx'd 06/2013  . Chest pain    Admitted to [facility] in 09/2011; refused stress test  . Chronic bronchitis   . Chronic pain   . COPD (chronic obstructive pulmonary disease) ([place])    on xray  . Metastatic cancer to lung ([place]) dx'd 2017  . Palpitation    Tachycardia reported by monitor clerk during a symptomatic spell  . Radiation 06/30/14-08/17/14   Bilateral Breast  . Tobacco abuse    60 pack years; 1.5 packs per day  . Wears dentures    top    SURGICAL HISTORY: Past Surgical History:  Procedure Laterality Date  . ASD REPAIR, OSTIUM PRIMUM  1996   dr Roxy Horseman  . BILATERAL  RADIOACTIVE SEED LOCALIZATION LUMPECTOMY Bilateral 04/08/2014   Performed by Jovita Kussmaul, MD at [facility]  . BILATERAL AXILLARY LYMPH NODE DISSECTION Bilateral 04/08/2014   Performed by Jovita Kussmaul, MD at [facility]  . BREAST BIOPSY Bilateral   . CESAREAN SECTION     x3  . CHOLECYSTECTOMY    . MINI EXPLORATORY LAPAROTOMY N/A 02/13/2017   Performed by Everitt Amber, MD at [facility]S  . open heart surgery    . PORT A CATH REVISION  1/15   put in   . TUBAL LIGATION    . XI ROBOTIC ASSISTED TOTAL HYSTERECTOMY WITH BILATERAL SALPINGO OOPHORECTOMY WITH LYSIS OF  ADHESIONS, UTERUS GREATER THAN 250GRAMS Bilateral 02/13/2017   Performed by Everitt Amber, MD at [facility] ORS    SOCIAL HISTORY: Social History   Socioeconomic History  . Marital status: Widowed    Spouse name: Not on file  . Number of children: Not on file  . Years of education: Not on file  . Highest education level: Not on file  Social Needs  . Financial resource strain: Not on file  . Food insecurity - worry: Not on file  . Food insecurity - inability: Not on file  . Transportation needs - medical: Not on file  . Transportation needs - non-medical: Not on file  Occupational History  . Not on file  Tobacco Use  . Smoking status: Former Smoker    Packs/day: 1.50    Years: 40.00    Pack years: 60.00    Types: Cigarettes    Last attempt to quit: 08/01/2012    Years since quitting: 4.8  . Smokeless tobacco: Never Used  Substance and Sexual Activity  . Alcohol use: No  . Drug use: No  . Sexual activity: Not Currently  Other Topics Concern  . Not on file  Social History Narrative  . Not on file    FAMILY HISTORY: Family History  Problem Relation Age of Onset  . Breast cancer Maternal Aunt     Review of Systems  Constitutional: Negative for appetite change, chills, fatigue, fever and unexpected weight change.  HENT:   Negative for hearing loss and lump/mass.   Eyes: Negative for eye problems and icterus.  Respiratory: Negative for chest tightness, cough and shortness of breath.   Cardiovascular: Negative for chest pain, leg swelling and palpitations.  Gastrointestinal: Positive for abdominal pain. Negative for abdominal distention, constipation, diarrhea, nausea and vomiting.  Endocrine: Negative for hot flashes.  Neurological: Negative for dizziness, extremity weakness and headaches.      PHYSICAL EXAMINATION  ECOG PERFORMANCE STATUS: 2 - Symptomatic, <50% confined to bed  Vitals:   06/15/17 1306  BP: 106/83  Pulse: 87  Resp: 18  Temp: 98.5 F (36.9 C)  SpO2:  100%    Physical Exam  Constitutional: She is oriented to person, place, and time and well-developed, well-nourished, and in no distress.  HENT:  Head: Normocephalic and atraumatic.  Mouth/Throat: Oropharynx is clear and moist. No oropharyngeal exudate.  Eyes: No scleral icterus.  Neck: Neck supple.  Cardiovascular: Normal rate, regular rhythm and normal heart sounds.  Pulmonary/Chest: Effort normal and breath sounds normal.  Abdominal: Soft. Bowel sounds are normal.  Lymphadenopathy:    She has no cervical adenopathy.  Neurological: She is alert and oriented to person, place, and time.  Skin: Skin is warm and dry. No rash noted.  Psychiatric: Mood and affect normal.    LABORATORY DATA:  CBC    Component Value Date/Time   WBC 8.9 06/15/2017 1209   WBC 9.3 06/11/2017 1653   RBC 3.88 06/15/2017 1209   RBC 4.24 06/11/2017 1653   HGB 8.1 (L) 06/15/2017 1209   HCT 25.6 (L) 06/15/2017 1209   PLT 305 06/15/2017 1209   MCV 65.9 (L) 06/15/2017 1209   MCH 21.0 (L) 06/15/2017 1209   MCH 21.0 (L) 06/11/2017 1653   MCHC 31.8 06/15/2017 1209   MCHC 31.6 06/11/2017 1653   RDW 19.9 (H) 06/15/2017 1209   LYMPHSABS 1.4 06/15/2017 1209   MONOABS 0.6 06/15/2017 1209   EOSABS 0.1 06/15/2017 1209   BASOSABS 0.0 06/15/2017 1209    CMP     Component Value Date/Time   NA 134 (L) 06/15/2017 1209   K 3.9 06/15/2017 1209   CL 101 06/11/2017 1653   CO2 27 06/15/2017 1209   GLUCOSE 94 06/15/2017 1209   BUN 14.4 06/15/2017 1209   CREATININE 0.7 06/15/2017 1209   CALCIUM 9.6 06/15/2017 1209   PROT 7.2 06/15/2017 1209   ALBUMIN 2.7 (L) 06/15/2017 1209   AST 29 06/15/2017 1209   ALT 14 06/15/2017 1209   ALKPHOS 62 06/15/2017 1209   BILITOT 0.34 06/15/2017 1209   GFRNONAA >60 06/11/2017 1653   GFRAA >60 06/11/2017 1653    ASSESSMENT and PLAN:   Bilateral breast cancer ([place]) Bilateral breast cancers are usually diagnosed 2015 treated with surgery, radiation, anastrozole Metastatic  breast cancer diagnosed 05/28/2015 as subpectoral mass, ER 5%, PR 0%, HER-2 negative, failed Xeloda and carboplatin and gemcitabine  CT CAP04/17/2017:RP and retrocrural lymphadenopathy, large uterine fibroids with necrosis;  MRI Abd: RP lymphadenopathy CT chest 11/20/16: Worsening bulky conglomeration of mediastinal and right hilar lymph nodes, lung nodules subcentimeter size slight increase, left breast mass was 3.7 x 5.8 cm now 5.4 x 7.4 cm CT scan on 11/12 indicates progression.    Treatment: 1. Halaven days 1 and 8 every 3 weeks completed 6 cycles (delayed due to surgery); stopped due to worsening peripheral neuropathy  2. Treatment plan: Patient will undergo PET scan on 11/23 followed by appointment for labs, evaluation and to start Pembrolizumab.  Foundation 1 testing, MSI testing, and BRCA testing is pending, and will not be back for a couple of weeks.  IT was explained to [place] that this is one of the final treatments we have to offer her, and the concern is high that this cancer will cause her life to end soon.  She verbalized understanding of this.  The details of Pembrolizumab were explained to her in detail and she received a detailed summary of this in her AVS.  Dr. Lindi Adie saw Vaughan Basta today as well and formulated the above plan and discussed it with her  and her niece April in detail.    All questions were answered. The patient knows to call the clinic with any problems, questions or concerns. We can certainly see the patient much sooner if necessary.  A total of (30) minutes of face-to-face time was spent with this patient with greater than 50% of that time in counseling and care-coordination.  This note was electronically signed. Scot Dock, NP 06/15/2017    Attending Note  I personally saw and examined Paula Navarro. The plan of care was discussed with her. I agree with the assessment and plan as documented above. 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 I discussed with her that no matter what treatment we choose her disease may be refractory to everything and that she could die from her metastatic disease in a relatively short period of time. We discussed that the goals of any further treatment are completely palliation. We sent for foundation one analysis and awaiting those results. PET/CT scan is planned to be performed soon. Our plan is to get compassionate use pembrolizumab and start the treatment next Friday. Goals of care were extensively discussed.  Patient reiterated that she is mentally and physically at peace with her diagnosis and that she would likely die relatively soon from her disease.  However she would like to try immunotherapy as a last resort.  Signed Rulon Eisenmenger, MD

## 2017-06-15 NOTE — Patient Instructions (Signed)
Pembrolizumab injection  What is this medicine?  PEMBROLIZUMAB (pem broe liz ue mab) is a monoclonal antibody. It is used to treat melanoma, head and neck cancer, Hodgkin lymphoma, non-small cell lung cancer, urothelial cancer, stomach cancer, and cancers that have a certain genetic condition.  This medicine may be used for other purposes; ask your health care provider or pharmacist if you have questions.  COMMON BRAND NAME(S): Keytruda  What should I tell my health care provider before I take this medicine?  They need to know if you have any of these conditions:  -diabetes  -immune system problems  -inflammatory bowel disease  -liver disease  -lung or breathing disease  -lupus  -organ transplant  -an unusual or allergic reaction to pembrolizumab, other medicines, foods, dyes, or preservatives  -pregnant or trying to get pregnant  -breast-feeding  How should I use this medicine?  This medicine is for infusion into a vein. It is given by a health care professional in a hospital or clinic setting.  A special MedGuide will be given to you before each treatment. Be sure to read this information carefully each time.  Talk to your pediatrician regarding the use of this medicine in children. While this drug may be prescribed for selected conditions, precautions do apply.  Overdosage: If you think you have taken too much of this medicine contact a poison control center or emergency room at once.  NOTE: This medicine is only for you. Do not share this medicine with others.  What if I miss a dose?  It is important not to miss your dose. Call your doctor or health care professional if you are unable to keep an appointment.  What may interact with this medicine?  Interactions have not been studied.  Give your health care provider a list of all the medicines, herbs, non-prescription drugs, or dietary supplements you use. Also tell them if you smoke, drink alcohol, or use illegal drugs. Some items may interact with your  medicine.  This list may not describe all possible interactions. Give your health care provider a list of all the medicines, herbs, non-prescription drugs, or dietary supplements you use. Also tell them if you smoke, drink alcohol, or use illegal drugs. Some items may interact with your medicine.  What should I watch for while using this medicine?  Your condition will be monitored carefully while you are receiving this medicine.  You may need blood work done while you are taking this medicine.  Do not become pregnant while taking this medicine or for 4 months after stopping it. Women should inform their doctor if they wish to become pregnant or think they might be pregnant. There is a potential for serious side effects to an unborn child. Talk to your health care professional or pharmacist for more information. Do not breast-feed an infant while taking this medicine or for 4 months after the last dose.  What side effects may I notice from receiving this medicine?  Side effects that you should report to your doctor or health care professional as soon as possible:  -allergic reactions like skin rash, itching or hives, swelling of the face, lips, or tongue  -bloody or black, tarry  -breathing problems  -changes in vision  -chest pain  -chills  -constipation  -cough  -dizziness or feeling faint or lightheaded  -fast or irregular heartbeat  -fever  -flushing  -hair loss  -low blood counts - this medicine may decrease the number of white blood cells, red blood cells   and platelets. You may be at increased risk for infections and bleeding.  -muscle pain  -muscle weakness  -persistent headache  -signs and symptoms of high blood sugar such as dizziness; dry mouth; dry skin; fruity breath; nausea; stomach pain; increased hunger or thirst; increased urination  -signs and symptoms of kidney injury like trouble passing urine or change in the amount of urine  -signs and symptoms of liver injury like dark urine, light-colored  stools, loss of appetite, nausea, right upper belly pain, yellowing of the eyes or skin  -stomach pain  -sweating  -weight loss  Side effects that usually do not require medical attention (report to your doctor or health care professional if they continue or are bothersome):  -decreased appetite  -diarrhea  -tiredness  This list may not describe all possible side effects. Call your doctor for medical advice about side effects. You may report side effects to FDA at 1-800-FDA-1088.  Where should I keep my medicine?  This drug is given in a hospital or clinic and will not be stored at home.  NOTE: This sheet is a summary. It may not cover all possible information. If you have questions about this medicine, talk to your doctor, pharmacist, or health care provider.   2018 Elsevier/Gold Standard (2016-04-25 12:29:36)

## 2017-06-15 NOTE — Assessment & Plan Note (Deleted)
Bilateral breast cancers are usually diagnosed 2015 treated with surgery, radiation, anastrozole Metastatic breast cancer diagnosed 05/28/2015 as subpectoral mass, ER 5%, PR 0%, HER-2 negative, failed Xeloda and carboplatin and gemcitabine  CT CAP04/17/2017:RP and retrocrural lymphadenopathy, large uterine fibroids with necrosis;  MRI Abd: RP lymphadenopathy CT chest 11/20/16: Worsening bulky conglomeration of mediastinal and right hilar lymph nodes, lung nodules subcentimeter size slight increase, left breast mass was 3.7 x 5.8 cm now 5.4 x 7.4 cm  Current treatment: 1. Halaven days 1 and 8 every 3 weeks completed 6 cycles (delayed due to surgery); stopped due to worsening peripheral neuropathy  2. Treatment plan:  Send for foundation 1 testing, MSI as well as BRCA testing Obtain PET/CT scan for restaging We may try compassionate use pembrolizumab

## 2017-06-15 NOTE — Addendum Note (Signed)
Addended by: Nicholas Lose on: 06/15/2017 04:36 PM   Modules accepted: Level of Service

## 2017-06-15 NOTE — Progress Notes (Deleted)
Paula Navarro:    Paula Donna, MD (Inactive) No address on file   DIAGNOSIS: Cancer Staging Bilateral breast cancer [facility]) Staging form: Breast, AJCC 7th Edition - Clinical: Stage IIB (T2, N1, cM0) - Unsigned Staging comments: Staged at breast conference 08/13/13  - Pathologic: Stage IV (M1) - Unsigned  Endometrial cancer ([place]) Staging form: Corpus Uteri - Carcinoma and Carcinosarcoma, AJCC 8th Edition - Pathologic stage from 02/13/2017: FIGO Stage IIIA, calculated as Stage Unknown (pT3a, pNX, cM0) - Signed by Everitt Amber, MD on 03/14/2017 - Clinical: No stage assigned - Unsigned   SUMMARY OF ONCOLOGIC HISTORY:   Bilateral breast cancer ([place])   07/23/2013 Mammogram    Bilateral breast masses. 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 N1 M0 stage IIB ER 100%, PR 0%, HER-2       06/17/2014 -  Radiation Therapy    Adjuvant radiation therapy      09/14/2014 -  Anti-estrogen oral therapy   Anastrozole 1 mg daily      05/28/2015 Imaging    CT scans: Enlarging subpectoral masses 3.1 x 3.5 cm, posteriorly lower density mass 4.5 x 2.1 cm, several right-sided lung nodules right lower lobe 1.4 cm, 3 other right lung nodules, 1.7 cm right iliac bone lesion      06/21/2015 Procedure    Left subpectoral mass biopsy: Invasive high-grade ductal carcinoma ER 5%, PR 0%, HER-2 negative ratio 1.29      09/01/2015 Imaging    Left chest wall mass increased in size 7.2 x 5.1 cm, multiple subcutaneous nodules, increase in the lung nodules both in number as well as in the size of existing nodules      09/10/2015 -  Chemotherapy    Carboplatin, gemcitabine days 1 and 8 q 3 weeks, carboplatin discontinued for neuropathy (treatment break from 01/20/2016 to 03/24/2016); Added Carboplatin back 04/14/16 (for progression)      01/26/2016 Imaging    Marked improvement in size of lung nodules with many of the nodules resolved index right lower lobe nodule 1.9 x 1.8 cm is now 0.7 x 0.6 cm, reduction in the size of left breast mass 7.2 cm down to 2.6 cm, enlargement in the hypodense mass in the uterus      04/06/2016 Imaging    CT chest: Interval progression of pre-existing lung nodules new lung metastases (46m, 174m 2.9 cm, 1.6  cm), interval progression of disease in the left breast 4.2 cm (was 2.6 cm), additional nodules 2.3 cm and 3.2 cm; CT head: scalp mass 1.9 cm      06/27/2016 Imaging    Ct chest: Stable lesions in lt breast deep to Left pectoralis, cystic lesion loc ant in left breast inc compared to previous; decrease in lung nodules (6mm to 3 mm; 12 mm to 7 mm, RML nodule 2.9 cm to 1.8 cm, RLL 14mm to 7 mm)      11/14/2016 Imaging    CT CAP:RP and retrocrural lymphadenopathy, large uterine fibroids with necrosis; MRI Abd: RP lymphadenopathy CT chest 11/20/16: Worsening bulky conglomeration of mediastinal and right hilar lymph nodes, lung nodules subcentimeter size slight increase, left breast mass  was 3.7 x 5.8 cm now 5.4 x 7.4 cm      12/08/2016 -  Chemotherapy    Palliative chemotherapy with [place] days 1 and 8 every 3 weeks stopped on 1 2018, to be resumed 05/25/2017       04/30/2017 - 05/18/2017 Radiation Therapy    Palliative radiation therapy to the scalp and the left chest wall lesion       Endometrial cancer (HCC)   02/13/2017 Surgery    Hysterectomy with BSO: HG Serous carcinoma 7.7 cm (>50% Myometrial inv)involving Uterus and Bilateral ovaries, Omentum: No carcinoma; T3a (FIGO stage IIIa)       CURRENT THERAPY:  INTERVAL HISTORY: Paula Navarro 66 y.o. female returns for    Patient Active Problem List   Diagnosis Date Noted  . Acute post-operative pain 05/10/2017  . Endometrial cancer (HCC) 12/18/2016  . Protein-calorie malnutrition, severe 11/15/2016  . UTI (urinary tract infection) 11/14/2016  . COPD (chronic obstructive pulmonary disease) (HCC)   . 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 Chemotherapy-induced thrombocytopenia 06/13/2016  . Scalp lesion 04/01/2016  . Encounter for central line care 12/08/2015  . Port catheter in place 11/19/2015  . Insomnia 11/01/2015  . Microcytic anemia 09/28/2015  . Lung metastases (HCC) 09/10/2015  . Bone metastases (HCC) 07/20/2015  . Goals of care, counseling/discussion 07/13/2015  . Metastatic breast cancer (HCC) 06/29/2015  . Chronic pain 03/16/2015  . Vaginal bleeding 09/24/2014  . Postherpetic neuralgia 09/03/2014  . Lymphedema 09/03/2014  . Suspected herpes zoster left C5 distribution 08/14/2014  . Neuropathy due to chemotherapeutic drug (HCC) 03/03/2014  . Anxiety 03/03/2014  . Bilateral breast cancer (HCC) 08/11/2013  . Palpitation   . Chest pain   . Atrial septal defect   . Laboratory test 11/25/2011  . Chronic bronchitis   . Tobacco abuse     is allergic to codeine and morphine and related.  MEDICAL HISTORY: Past Medical History:  Diagnosis Date  . Anemia   . Anxiety    . Atrial septal defect 1996   Surgical repair in 1996  . Breast cancer (HCC) dx'd 06/2013  . Chest pain    Admitted to [facility] in 09/2011; refused stress test  . Chronic bronchitis   . Chronic pain   . COPD (chronic obstructive pulmonary disease) (HCC)    on xray  . Metastatic cancer to lung (HCC) dx'd 2017  . Palpitation    Tachycardia reported by monitor clerk during a symptomatic spell  . Radiation 06/30/14-08/17/14   Bilateral Breast  . Tobacco abuse    60 pack years; 1.5 packs per day  . Wears dentures    top    SURGICAL HISTORY: Past Surgical History:  Procedure Laterality Date  .   ASD REPAIR, OSTIUM PRIMUM  1996   dr Roxy Horseman  . AXILLARY LYMPH NODE DISSECTION Bilateral 04/08/2014   Procedure:  BILATERAL AXILLARY LYMPH NODE DISSECTION;  Surgeon: Autumn Messing III, MD;  Location: [place];  Service: General;  Laterality: Bilateral;  . BREAST BIOPSY Bilateral   . BREAST LUMPECTOMY WITH RADIOACTIVE SEED LOCALIZATION Bilateral 04/08/2014   Procedure: BILATERAL  RADIOACTIVE SEED LOCALIZATION LUMPECTOMY ;  Surgeon: Autumn Messing III, MD;  Location: [place];  Service: General;  Laterality: Bilateral;  . CESAREAN SECTION     x3  . CHOLECYSTECTOMY    . LAPAROTOMY N/A 02/13/2017   Procedure: MINI EXPLORATORY LAPAROTOMY;  Surgeon: Everitt Amber, MD;  Location: [facility];  Service: Gynecology;  Laterality: N/A;  . open heart surgery    . PORT A CATH REVISION  1/15   put in   . ROBOTIC ASSISTED TOTAL HYSTERECTOMY WITH BILATERAL SALPINGO OOPHERECTOMY Bilateral 02/13/2017   Procedure: XI ROBOTIC ASSISTED TOTAL HYSTERECTOMY WITH BILATERAL SALPINGO OOPHORECTOMY WITH LYSIS OF ADHESIONS, UTERUS GREATER THAN 250GRAMS;  Surgeon: Everitt Amber, MD;  Location: [facility];  Service: Gynecology;  Laterality: Bilateral;  . TUBAL LIGATION      SOCIAL HISTORY: Social History   Socioeconomic History  . Marital status: Widowed    Spouse name: Not on file  . Number of children: Not on file   . Years of education: Not on file  . Highest education level: Not on file  Social Needs  . Financial resource strain: Not on file  . Food insecurity - worry: Not on file  . Food insecurity - inability: Not on file  . Transportation needs - medical: Not on file  . Transportation needs - non-medical: Not on file  Occupational History  . Not on file  Tobacco Use  . Smoking status: Former Smoker    Packs/day: 1.50    Years: 40.00    Pack years: 60.00    Types: Cigarettes    Last attempt to quit: 08/01/2012    Years since quitting: 4.8  . Smokeless tobacco: Never Used  Substance and Sexual Activity  . Alcohol use: No  . Drug use: No  . Sexual activity: Not Currently  Other Topics Concern  . Not on file  Social History Narrative  . Not on file    FAMILY HISTORY: Family History  Problem Relation Age of Onset  . Breast cancer Maternal Aunt     Review of Systems - Oncology    PHYSICAL EXAMINATION  ECOG PERFORMANCE STATUS: {CHL ONC ECOG PS:[phone]}  There were no vitals filed for this visit.  Physical Exam  LABORATORY DATA:  CBC    Component Value Date/Time   WBC 9.3 06/11/2017 1653   RBC 4.24 06/11/2017 1653   HGB 8.9 (L) 06/11/2017 1653   HGB 9.8 (L) 06/01/2017 1232   HCT 28.2 (L) 06/11/2017 1653   HCT 31.0 (L) 06/01/2017 1232   PLT 288 06/11/2017 1653   PLT 325 06/01/2017 1232   MCV 66.5 (L) 06/11/2017 1653   MCV 65.3 (L) 06/01/2017 1232   MCH 21.0 (L) 06/11/2017 1653   MCHC 31.6 06/11/2017 1653   RDW 18.4 (H) 06/11/2017 1653   RDW 19.3 (H) 06/01/2017 1232   LYMPHSABS 1.8 06/11/2017 1653   LYMPHSABS 1.3 06/01/2017 1232   MONOABS 0.6 06/11/2017 1653   MONOABS 0.7 06/01/2017 1232   EOSABS 0.1 06/11/2017 1653   EOSABS 0.1 06/01/2017 1232   BASOSABS 0.0 06/11/2017 1653   BASOSABS 0.0 06/01/2017  1232    CMP     Component Value Date/Time   NA 136 06/11/2017 1653   NA 136 06/01/2017 1232   K 3.9 06/11/2017 1653   K 3.9 06/01/2017 1232   CL 101  06/11/2017 1653   CO2 28 06/11/2017 1653   CO2 29 06/01/2017 1232   GLUCOSE 89 06/11/2017 1653   GLUCOSE 99 06/01/2017 1232   BUN 19 06/11/2017 1653   BUN 16.7 06/01/2017 1232   CREATININE 0.62 06/11/2017 1653   CREATININE 0.7 06/01/2017 1232   CALCIUM 9.6 06/11/2017 1653   CALCIUM 10.4 06/01/2017 1232   PROT 7.7 06/11/2017 1653   PROT 8.2 06/01/2017 1232   ALBUMIN 3.1 (L) 06/11/2017 1653   ALBUMIN 2.9 (L) 06/01/2017 1232   AST 35 06/11/2017 1653   AST 32 06/01/2017 1232   ALT 17 06/11/2017 1653   ALT 14 06/01/2017 1232   ALKPHOS 59 06/11/2017 1653   ALKPHOS 70 06/01/2017 1232   BILITOT 0.7 06/11/2017 1653   BILITOT 0.65 06/01/2017 1232   GFRNONAA >60 06/11/2017 1653   GFRAA >60 06/11/2017 1653     ASSESSMENT and PLAN:   No problem-specific Assessment & Plan notes found for this encounter.   No orders of the defined types were placed in this encounter.   All questions were answered. The patient knows to call the clinic with any problems, questions or concerns. We can certainly see the patient much sooner if necessary. This note was electronically signed. Lindsey C Causey, NP 06/15/2017 

## 2017-06-17 ENCOUNTER — Telehealth: Payer: Self-pay

## 2017-06-17 NOTE — Telephone Encounter (Signed)
Patient aware of her NUT appt

## 2017-06-19 ENCOUNTER — Telehealth: Payer: Self-pay | Admitting: Genetics

## 2017-06-19 ENCOUNTER — Other Ambulatory Visit: Payer: Self-pay | Admitting: Hematology and Oncology

## 2017-06-19 MED ORDER — OLAPARIB 150 MG PO TABS: 300.0000 mg | ORAL_TABLET | Freq: Two times a day (BID) | ORAL | 3 refills | Status: DC

## 2017-06-19 NOTE — Telephone Encounter (Addendum)
Patient is positive for a BRCA2 mutation.      Was unable to reach patient after several attempts and was unable to disclose result to patient.  A letter will be sent requesting she contact me to discuss result further.

## 2017-06-22 ENCOUNTER — Other Ambulatory Visit: Payer: Medicare Other

## 2017-06-22 ENCOUNTER — Ambulatory Visit (HOSPITAL_COMMUNITY): Payer: Medicare Other

## 2017-06-22 ENCOUNTER — Telehealth: Payer: Self-pay | Admitting: Pharmacist

## 2017-06-22 ENCOUNTER — Telehealth: Payer: Self-pay | Admitting: Adult Health

## 2017-06-22 ENCOUNTER — Ambulatory Visit: Payer: Medicare Other | Admitting: Adult Health

## 2017-06-22 ENCOUNTER — Telehealth: Payer: Self-pay | Admitting: Pharmacy Technician

## 2017-06-22 ENCOUNTER — Ambulatory Visit: Payer: Medicare Other

## 2017-06-22 ENCOUNTER — Encounter (HOSPITAL_COMMUNITY): Payer: Self-pay

## 2017-06-22 DIAGNOSIS — C50011 Malignant neoplasm of nipple and areola, right female breast: Secondary | ICD-10-CM

## 2017-06-22 DIAGNOSIS — C50012 Malignant neoplasm of nipple and areola, left female breast: Principal | ICD-10-CM

## 2017-06-22 MED ORDER — OLAPARIB 100 MG PO TABS: 200.0000 mg | ORAL_TABLET | Freq: Two times a day (BID) | ORAL | 0 refills | Status: DC

## 2017-06-22 MED FILL — LYNPARZA 100 MG TAB: 100 | 30 days supply | Qty: 120 | Fill #0

## 2017-06-22 NOTE — Telephone Encounter (Signed)
Oral Chemotherapy Pharmacist Encounter   I spoke with patient today for overview of: Lynparza.   Pt is doing well. Counseled patient on administration, dosing, side effects, monitoring, drug-food interactions, safe handling, storage, and disposal.  MD in office today (Thanksgiving holiday). As prior authorization was approved, went and sent reduced-dose Lynparza (200mg  BID vs 300mg  BID) to the pharmacy for processing. Patient will be able to pick it up on Monday 06/25/17 after 2pm (pharmacy has to order). Patient understands [name] is a limited distribution medication and prefers it be dispensed at the [facility] as opposed to long-distance, Psychologist, sport and exercise.  Patient will take Lynparza 100mg  tablets, 2 tablets (200mg ) by mouth 2 times daily without regard to food. Patient counseled that taking with food may increase tolerability. Patient also understands her dose may increase to 150mg  tabets, 2 tablets (300mg ) twice daily based on toleration of 200mg  BID dose. [name] start date: 06/26/17   Side effects include but not limited to: nausea, vomiting, diarrhea, taste changes, mouth sores, fatigue, constipation, decreased blood counts, and joint pain.  Reviewed with patient importance of keeping a medication schedule and plan for any missed doses.  Ms. Wiest voiced understanding and appreciation.   All questions answered. Medication reconciliation performed and medication/allergy list updated.  Patient will need follow-up at 4 weeks post Lynparza start, not yet scheduled.  Patient knows to call the office with questions or concerns. Oral Oncology Clinic will continue to follow.  Thank you,  Johny Drilling, PharmD, BCPS, BCOP 06/22/2017   11:32 AM Oral Oncology Clinic [phone]

## 2017-06-22 NOTE — Telephone Encounter (Signed)
Oral Oncology Patient Advocate Encounter  Prior Authorization for [name] has been approved.    PA# [id number] Effective dates: 06/22/2017 through 07/30/2018  Oral Oncology Clinic will continue to follow.   Fabio Asa. Melynda Keller, [name] Patient [place] [phone] 06/22/2017 11:09 AM

## 2017-06-22 NOTE — Telephone Encounter (Signed)
No 11/16 los

## 2017-06-22 NOTE — Telephone Encounter (Addendum)
Oral Oncology Pharmacist Encounter  Received new prescription for Lynparza (olaparib) for the treatment of BRCA-mutation positive, metastatic breast cancer- previously treated, planned duration until diease progression or unacceptable toxicity.  Labs from 06/15/17 assessed, OK for treatment. Patient with low weight, est CrCl 50 mL/min (SCr=0.7, rounded to 0.8 per protocol). Manufacturer recommends initial dose reduction to 210m BID with CrCl 31-50. This will be discussed with MD prior to sending prescription to pharmacy for filling.  Current medication list in Epic reviewed, [place]  Prior authorization has been submitted and is approved. [facility]. Requested pharmacy ([facility] is not able to dispense this medication.  Oral Oncology Clinic will continue to follow for  copayment issues, initial counseling and start date.  JJohny Drilling PharmD, BCPS, BCOP 06/22/2017 10:36 AM Oral Oncology Clinic [phone]

## 2017-06-22 NOTE — Telephone Encounter (Signed)
Oral Oncology Patient Advocate Encounter  Received notification from [place] that prior authorization for [name] is required.  PA submitted on CoverMyMeds Key [id number] Status is pending  Oral Oncology Clinic will continue to follow.  Fabio Asa. Melynda Keller, [place] Patient [place] [phone] 06/22/2017 10:57 AM

## 2017-06-25 NOTE — Telephone Encounter (Signed)
Was not able to get in touch with patient to obtain information- the laboratory will try to contact her as well.

## 2017-06-26 ENCOUNTER — Other Ambulatory Visit: Payer: Self-pay

## 2017-06-26 ENCOUNTER — Telehealth: Payer: Self-pay

## 2017-06-26 ENCOUNTER — Ambulatory Visit
Admission: RE | Admit: 2017-06-26 | Discharge: 2017-06-26 | Disposition: A | Discharge status: Home / Self Care | Payer: Medicare Other | Source: Ambulatory Visit | Attending: Radiation Oncology | Admitting: Radiation Oncology

## 2017-06-26 ENCOUNTER — Encounter: Payer: Self-pay | Admitting: Radiation Oncology

## 2017-06-26 VITALS — BP 101/73 | HR 102 | Temp 99.6°F | Resp 18 | Ht 62.0 in | Wt 110.0 lb

## 2017-06-26 DIAGNOSIS — C50911 Malignant neoplasm of unspecified site of right female breast: Secondary | ICD-10-CM | POA: Insufficient documentation

## 2017-06-26 DIAGNOSIS — Z923 Personal history of irradiation: Secondary | ICD-10-CM | POA: Insufficient documentation

## 2017-06-26 DIAGNOSIS — C770 Secondary and unspecified malignant neoplasm of lymph nodes of head, face and neck: Secondary | ICD-10-CM

## 2017-06-26 DIAGNOSIS — Z885 Allergy status to narcotic agent status: Secondary | ICD-10-CM | POA: Diagnosis not present

## 2017-06-26 DIAGNOSIS — C792 Secondary malignant neoplasm of skin: Secondary | ICD-10-CM | POA: Diagnosis not present

## 2017-06-26 DIAGNOSIS — C50012 Malignant neoplasm of nipple and areola, left female breast: Secondary | ICD-10-CM

## 2017-06-26 DIAGNOSIS — C50912 Malignant neoplasm of unspecified site of left female breast: Secondary | ICD-10-CM | POA: Insufficient documentation

## 2017-06-26 DIAGNOSIS — C50011 Malignant neoplasm of nipple and areola, right female breast: Secondary | ICD-10-CM

## 2017-06-26 DIAGNOSIS — Z79899 Other long term (current) drug therapy: Secondary | ICD-10-CM | POA: Diagnosis not present

## 2017-06-26 NOTE — Telephone Encounter (Signed)
Called pt to return her vm regarding some palpitations that she had been experiencing, after taking her Lynparza medication, for the first time this morning. Pt states that she felt her heart started racing, but she is not sure if this is caused by her new medication or not. Pt denies any dizziness or sob at this time. She does report some ankle edema, which was present prior to starting her oral chemo today.  Pt was seen by radonc today. She states that she was advised to elevate her legs to decrease swelling and increase oral hydration.   Consulted with pharmacist about pt recent symptoms of tachycardia (palpitations) this morning, and was notified that no cardiac symptoms were not common as side effect of this medication. Told pt to keep track of her symptoms on a piece of paper and write down symptoms after taking her medication. Pt states that she will try this at home to monitor closely.   Told pt that a message was sent to scheduling to have her come back for labs/MD appt in 4 weeks for recheck of oral chemo. Pt verbalize understanding.   Pt coming in tomorrow for nutrition visit. Told pt to keep an eye on her symptoms and notify MD office if it doesn't go away so she can be evaluated. Advised that she should seek help in the ED if it persist overnight without relief or worsens. Pt verbalized understanding. No further needs at this time.

## 2017-06-27 ENCOUNTER — Telehealth: Payer: Self-pay

## 2017-06-27 ENCOUNTER — Telehealth: Payer: Self-pay | Admitting: *Deleted

## 2017-06-27 ENCOUNTER — Encounter: Payer: Medicare Other | Admitting: Nutrition

## 2017-06-27 ENCOUNTER — Telehealth: Payer: Self-pay | Admitting: Hematology and Oncology

## 2017-06-27 NOTE — Telephone Encounter (Signed)
"  I need an appointment to see [name] or Dr. Lindi Adie.  Concerned about palpitations and I forgot to tell them about swelling to my feet and side of my neck at right port-a-cath.  Scheduled to see Nutritionist today at 11:45.  Not having any chest pain, S.O.B., or perspiration.  It goes away or slows down when I lie down.  Woke up this morning with palpitations.  Palpitations with my heart surgery were not this bad.  Started Lynparza yesterday.  Is this a new drug causing the palpitations?  Return number [phone]."  Advised to go to ED for evaluation and higher care level if needed as instructed last evening.   "Can I talk with [name]?  If I need to go to ED, I'll go instead of seeing nutritionist."  Routing call information to collaborative nurse and provider for review.  Further patient communication through collaborative nurse.

## 2017-06-27 NOTE — Progress Notes (Signed)
Radiation Oncology         [phone] ________________________________  Name: Paula Navarro MRN: [id number]  Date of Service: 06/26/2017 DOB: [birth year]  Post Treatment Note  CC: [name], MD  Nicholas Lose, MD  Diagnosis:   metastatic bilateral breast cancer with chest wall recurrence as well as local failure in the left supraclavicular region previously with scalp metastasis       Interval Since Last Radiation:  6 weeks   04/18/2017 - 05/16/2017: The scalp and left chest were each treated to 40 Gy in 16 fractions using the mode "Photon" and technique "Isodose Plan" delivered daily.   06/30/14-08/17/14:   45 gray to the bilateral breasts and bilateral supraclavicular fossa as well as a 16 gray boost to the lumpectomy cavities    Narrative:  The patient returns today for routine follow-up.  In summary this is a pleasant patient with recurrent metastatic breast cancer to the chest wall on the left and scalp. She recently completed a course of palliative radiotherapy. She tolerated radiotherapy well, however during treatment her tumor did have several episodes of bleeding on the chest wall which was managed with surgicel and silver nitrate. She is followed by Dr. Marlou Starks, but surgical resection is not technically feasible at this time due to concerns for primary closer.                               On review of systems, the patient states she's doing well. She has some mild oozing of serous/fibrinous discharge from her left chest wall, and no active bleeding. Her scalp has been doing well in terms of size, but this past week she bumped her head on the refrigerator at home which caused it to break the skin. She has this dressed, but it continues to ooze as well. No other complaints are noted.   ALLERGIES:  is allergic to codeine; morphine and related; and oxycodone hcl.  Meds: Current Outpatient Medications  Medication Sig Dispense Refill  . acetaminophen (TYLENOL) 500 MG tablet  Take 500 mg by mouth every 6 (six) hours as needed.    . B Complex-C (SUPER B COMPLEX PO) Take 1 tablet by mouth daily.    . calcium-vitamin D (OSCAL-500) 500-400 MG-UNIT tablet Take 1 tablet by mouth 2 (two) times daily. (Patient taking differently: Take 1 tablet by mouth 2 (two) times daily. Pt takes once daily.) 60 tablet 3  . Clobetasol Prop Emollient Base (CLOBETASOL PROPIONATE E) 0.05 % emollient cream Apply 1 application 2 (two) times daily topically. 30 g 0  . docusate sodium (COLACE) 100 MG capsule Take 1 capsule (100 mg total) by mouth 2 (two) times daily. 10 capsule 0  . fentaNYL (DURAGESIC - DOSED MCG/HR) 25 MCG/HR patch Place 1 patch (25 mcg total) every 3 (three) days onto the skin. 10 patch 0  . gabapentin (NEURONTIN) 300 MG capsule TAKE 2 CAPSULES (600 MG TOTAL) BY MOUTH 3 (THREE) TIMES DAILY. 180 capsule 2  . HYDROmorphone (DILAUDID) 4 MG tablet Take 1 tablet (4 mg total) every 4 (four) hours as needed by mouth for severe pain. 120 tablet 0  . LORazepam (ATIVAN) 1 MG tablet TAKE 1 TABLET BY MOUTH EVERYDAY AT BEDTIME 30 tablet 2  . olaparib (LYNPARZA) 100 MG tablet Take 2 tablets (200 mg total) by mouth 2 (two) times daily. Swallow whole with food. 120 tablet 0  . polyethylene glycol (MIRALAX / GLYCOLAX) packet Take  17 g by mouth 2 (two) times daily. 14 each 0  . zolpidem (AMBIEN) 10 MG tablet Take 1 tablet (10 mg total) by mouth at bedtime. 30 tablet 2  . ibuprofen (ADVIL,MOTRIN) 600 MG tablet Take 1 tablet (600 mg total) by mouth every 8 (eight) hours as needed (mild pain). (Patient not taking: Reported on 06/26/2017) 30 tablet 0  . nystatin (MYCOSTATIN) 100000 UNIT/ML suspension Take 5 mLs (500,000 Units total) by mouth 4 (four) times daily. (Patient not taking: Reported on 06/26/2017) 60 mL 0  . prochlorperazine (COMPAZINE) 10 MG tablet Take 1 tablet (10 mg total) by mouth every 6 (six) hours as needed (Nausea or vomiting). (Patient not taking: Reported on 06/26/2017) 30 tablet 1    No current facility-administered medications for this encounter.     Physical Findings:  height is 5\' 2"  (1.575 m) and weight is 110 lb (49.9 kg). Her oral temperature is 99.6 F (37.6 C). Her blood pressure is 101/73 and her pulse is 102 (abnormal). Her respiration is 18 and oxygen saturation is 100%.  Pain Assessment Pain Score: 0-No pain/10 In general this is a well appearing African American female in no acute distress. She's alert and oriented x4 and appropriate throughout the examination. Cardiopulmonary assessment is negative for acute distress and she exhibits normal effort. Her left breast reveals a fungating mass with mild drainage on a gauze, and her scalp is dressed, and a spot of dried blood is noted over the gauze it's dressed in.    Lab Findings: Lab Results  Component Value Date   WBC 8.9 06/15/2017   HGB 8.1 (L) 06/15/2017   HCT 25.6 (L) 06/15/2017   MCV 65.9 (L) 06/15/2017   PLT 305 06/15/2017     Radiographic Findings: Ct Renal Stone Study  Result Date: 06/11/2017 CLINICAL DATA:  Low back pain EXAM: CT ABDOMEN AND PELVIS WITHOUT CONTRAST TECHNIQUE: Multidetector CT imaging of the abdomen and pelvis was performed following the standard protocol without IV contrast. COMPARISON:  02/05/2017, 11/14/2016 FINDINGS: Lower chest: Partially visualized 15 mm right lower lobe pulmonary mass, series 4, image number 1, not seen on prior. Increased subpleural left lower lobe pulmonary nodule measuring 6 mm, series 4, image number 1. Increased pleural based right middle lobe pulmonary nodules, the largest measures 15 mm, series 4, image number 5. Diffuse skin thickening of both breasts. Hepatobiliary: Stable appearance of intra and extrahepatic biliary dilatation post cholecystectomy. Limited assessment for focal abnormality in the liver are without contrast. Pancreas: Unremarkable. No pancreatic ductal dilatation or surrounding inflammatory changes. Spleen: Normal in size without  focal abnormality. Adrenals/Urinary Tract: Adrenal glands are within normal limits. Kidneys show no hydronephrosis or stone. The bladder is unremarkable Stomach/Bowel: The stomach is nonenlarged. No dilated small bowel. No colon wall thickening. Vascular/Lymphatic: Nonaneurysmal aorta. Scattered atherosclerotic calcification. Right retroperitoneal/ periaortic mass measuring 6.6 x 5.8 cm, new or increased compared to prior. Increased right iliac soft tissue mass, difficult to separate from adjacent iliopsoas muscle, measures approximately 2.3 cm. Suspected right external iliac adenopathy measuring 12 mm. Reproductive: Interval hysterectomy Other: Negative for free air or free fluid. New soft tissue nodule in the left upper quadrant measuring 13 mm, anterior to the distal transverse colon. Musculoskeletal: No acute or suspicious bone lesion. IMPRESSION: 1. Negative for nephrolithiasis, hydronephrosis or ureteral stone 2. Increased size of multiple right middle lobe pleural based nodules with new 15 mm right lower lobe pulmonary nodule and increased left lower lobe subpleural pulmonary nodule, consistent with progression of metastatic  disease. 3. New or increased bulky right periaortic mass, also consistent with metastatic disease. Increased right common iliac adenopathy with mass lesion difficult to separate from adjacent psoas muscle. Increased right external iliac adenopathy. 4. New 15 mm soft tissue nodule in the left upper quadrant, anterior to the distal transverse colon, also consistent with metastatic disease 5. Interval hysterectomy Electronically Signed   By: Donavan Foil M.D.   On: 06/11/2017 18:21    Impression/Plan: 1. Metastatic bilateral breast cancer with chest wall recurrence as well as local failure in the left supraclavicular region  with scalp metastasis. The patient tolerated radiotherapy well. Her skin is improving along the treatment field, but her tumors remain persistent. We will follow up  with her as needed moving forward, but she will also see Dr. Lindi Adie for systemic therapies.          Carola Rhine, PAC

## 2017-06-27 NOTE — Telephone Encounter (Signed)
RN returned call to patient to discuss previous conversation with triage nurese of palpitations and swelling to feet and right neck. Patient advised that Dr. Lindi Adie has agreed to see patient when she comes in for 1145 appointment with nutritionist. Patient verbalized "I was having a rough morning, but I feel better now. I'm not coming for that appointment, I've already cancelled it. I can't see him today." Patient reports that "paramedics came and checked me out. They said my heart rate was up a little bit, but when they put me on the monitor; everything was fine." Patient states she does not recall what her pulse was when checked by pararmedic, but reports blood pressure was 102/64 and pulse was 103  at 0400. Patient continues to express concern regarding swelling "in my neck near this port"; RN reiterated that MD is available to see her, patient insists that she can not come into office today as "I already cancelled the van earlier." Patient states that she will continue to monitor swelling and will call back with concerns. States she will try to get into office by Friday of this week. Conversation discussed with Dr. Lindi Adie.

## 2017-06-27 NOTE — Telephone Encounter (Signed)
Spoke with patient and scheduled appt per 12/27 sch msg. Moved up due to swelling per nurse

## 2017-06-27 NOTE — Telephone Encounter (Signed)
Will discuss with MD and call patient with updates.

## 2017-07-02 ENCOUNTER — Encounter: Payer: Self-pay | Admitting: *Deleted

## 2017-07-02 NOTE — Progress Notes (Signed)
1125 Call from Paula Navarro she was concerned about an area on her head that bleed some yesterday a small amount; her question was could she receive more radiation?,  that Paula Navarro, Paula Navarro  said she would talk with Paula Navarro about this matter. The LCW looked larger to her since we saw her last Tuesday and her right foot looked swollen some today, she had not hit her foot.  Asked her to elevate her right leg while sitting.  Asked to continue doing her dressing change to the LCW per her routine and use the normal saline to help release the dressing before removing the dressing also apply a small dressing to her scalp if she has bleeding to help stop the bleeding.  I replied that I would speak with [name] and let her know about this call and her concerns this morning. [place] with Paula Ha, PA-C about the above concerns.  Asked to call  Paula Navarro and discuss the following as stated below. 5 Spoke with Paula Navarro to let her know that she received radiation to her head metastasis with her last radiation treatment to her breast and chest wall.  When she came on last Tuesday that was not discussed as being part of her treatment with her one month follow up.  Asked Paula Navarro to continue to doing her dressing change as usual and do more saline application to the old dressing before removing the old dressing to help release it with tension on her skin.  Elevate her right leg while sitting to help reduce swelling.  Paula Navarro stated she has an appointment with Paula Navarro tomorrow she will have him check her her head,chest and right leg.

## 2017-07-03 ENCOUNTER — Other Ambulatory Visit (HOSPITAL_BASED_OUTPATIENT_CLINIC_OR_DEPARTMENT_OTHER): Payer: Medicare Other

## 2017-07-03 ENCOUNTER — Other Ambulatory Visit: Payer: Self-pay

## 2017-07-03 ENCOUNTER — Telehealth: Payer: Self-pay | Admitting: Hematology and Oncology

## 2017-07-03 ENCOUNTER — Ambulatory Visit (HOSPITAL_BASED_OUTPATIENT_CLINIC_OR_DEPARTMENT_OTHER): Payer: Medicare Other | Admitting: Hematology and Oncology

## 2017-07-03 ENCOUNTER — Ambulatory Visit (HOSPITAL_COMMUNITY)
Admission: RE | Admit: 2017-07-03 | Discharge: 2017-07-03 | Disposition: A | Discharge status: Home / Self Care | Payer: Medicare Other | Source: Ambulatory Visit | Attending: Hematology and Oncology | Admitting: Hematology and Oncology

## 2017-07-03 ENCOUNTER — Ambulatory Visit (HOSPITAL_BASED_OUTPATIENT_CLINIC_OR_DEPARTMENT_OTHER): Payer: Medicare Other

## 2017-07-03 DIAGNOSIS — C50012 Malignant neoplasm of nipple and areola, left female breast: Secondary | ICD-10-CM

## 2017-07-03 DIAGNOSIS — F419 Anxiety disorder, unspecified: Secondary | ICD-10-CM

## 2017-07-03 DIAGNOSIS — C50011 Malignant neoplasm of nipple and areola, right female breast: Secondary | ICD-10-CM

## 2017-07-03 DIAGNOSIS — D649 Anemia, unspecified: Secondary | ICD-10-CM | POA: Insufficient documentation

## 2017-07-03 DIAGNOSIS — R609 Edema, unspecified: Secondary | ICD-10-CM

## 2017-07-03 DIAGNOSIS — C50919 Malignant neoplasm of unspecified site of unspecified female breast: Secondary | ICD-10-CM

## 2017-07-03 DIAGNOSIS — C7951 Secondary malignant neoplasm of bone: Secondary | ICD-10-CM

## 2017-07-03 LAB — COMPREHENSIVE METABOLIC PANEL
ALBUMIN: 2.6 g/dL — AB (ref 3.5–5.0)
ALK PHOS: 59 U/L (ref 40–150)
ALT: 14 U/L (ref 0–55)
AST: 30 U/L (ref 5–34)
Anion Gap: 9 mEq/L (ref 3–11)
BILIRUBIN TOTAL: 0.37 mg/dL (ref 0.20–1.20)
BUN: 12.7 mg/dL (ref 7.0–26.0)
CO2: 27 meq/L (ref 22–29)
CREATININE: 0.8 mg/dL (ref 0.6–1.1)
Calcium: 9.8 mg/dL (ref 8.4–10.4)
Chloride: 97 mEq/L — ABNORMAL LOW (ref 98–109)
GLUCOSE: 79 mg/dL (ref 70–140)
Potassium: 4.1 mEq/L (ref 3.5–5.1)
SODIUM: 133 meq/L — AB (ref 136–145)
TOTAL PROTEIN: 7.3 g/dL (ref 6.4–8.3)

## 2017-07-03 LAB — CBC WITH DIFFERENTIAL/PLATELET
BASO%: 0.2 % (ref 0.0–2.0)
Basophils Absolute: 0 10*3/uL (ref 0.0–0.1)
EOS ABS: 0.2 10*3/uL (ref 0.0–0.5)
EOS%: 1.6 % (ref 0.0–7.0)
HCT: 19.4 % — ABNORMAL LOW (ref 34.8–46.6)
HEMOGLOBIN: 5.9 g/dL — AB (ref 11.6–15.9)
LYMPH%: 16.6 % (ref 14.0–49.7)
MCH: 20.6 pg — ABNORMAL LOW (ref 25.1–34.0)
MCHC: 30.4 g/dL — ABNORMAL LOW (ref 31.5–36.0)
MCV: 67.8 fL — AB (ref 79.5–101.0)
MONO#: 0.6 10*3/uL (ref 0.1–0.9)
MONO%: 6.4 % (ref 0.0–14.0)
NEUT%: 75.2 % (ref 38.4–76.8)
NEUTROS ABS: 6.9 10*3/uL — AB (ref 1.5–6.5)
Platelets: 394 10*3/uL (ref 145–400)
RBC: 2.86 10*6/uL — AB (ref 3.70–5.45)
RDW: 20.2 % — AB (ref 11.2–14.5)
WBC: 9.2 10*3/uL (ref 3.9–10.3)
lymph#: 1.5 10*3/uL (ref 0.9–3.3)

## 2017-07-03 LAB — TSH: TSH: 1.11 m(IU)/L (ref 0.308–3.960)

## 2017-07-03 MED ORDER — SODIUM CHLORIDE 0.9% FLUSH
10.0000 mL | INTRAVENOUS | Status: DC | PRN
  Administered 2017-07-03: 10 mL via INTRAVENOUS
  Filled 2017-07-03: qty 10

## 2017-07-03 MED ORDER — HEPARIN SOD (PORK) LOCK FLUSH 100 UNIT/ML IV SOLN
500.0000 [IU] | Freq: Once | INTRAVENOUS | Status: AC | PRN
  Administered 2017-07-03: 500 [IU] via INTRAVENOUS
  Filled 2017-07-03: qty 5

## 2017-07-03 NOTE — Progress Notes (Signed)
[email or web address]

## 2017-07-03 NOTE — Assessment & Plan Note (Signed)
Bilateral breast cancers are usually diagnosed 2015 treated with surgery, radiation, anastrozole Metastatic breast cancer diagnosed 05/28/2015 as subpectoral mass, ER 5%, PR 0%, HER-2 negative, failed Xeloda and carboplatin and gemcitabine  CT CAP04/17/2017:RP and retrocrural lymphadenopathy, large uterine fibroids with necrosis;  MRI Abd: RP lymphadenopathy CT chest 11/20/16: Worsening bulky conglomeration of mediastinal and right hilar lymph nodes, lung nodules subcentimeter size slight increase, left breast mass was 3.7 x 5.8 cm now 5.4 x 7.4 cm CT scan on 11/12 indicates progression.    Treatment: Olaparib started 06/26/2017 1.  Leg swelling: Will obtain ultrasound of the legs to evaluate for DVT 2. severe anemia: Patient will need 2 units of blood transfusion which will be arranged for tomorrow.  I called the patient to inform her of her hemoglobin and left a voicemail.  Return to clinic in 2 weeks for recheck of her labs and follow-up

## 2017-07-03 NOTE — Progress Notes (Signed)
Patient Care Team: [name], MD as PCP - General (Internal Medicine) Rothbart, Cristopher Estimable, MD (Cardiology) [name], MD as Consulting Physician (Gastroenterology) Delice Bison, Charlestine Massed, NP as Nurse Practitioner (Hematology and Oncology) Nicholas Lose, MD as Consulting Physician (Hematology and Oncology) Kyung Rudd, MD as Consulting Physician (Radiation Oncology)  DIAGNOSIS:  Encounter Diagnosis  Name Primary?  . 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 N1 M0 stage IIB ER 100%, PR 0%, HER-2       06/17/2014 -  Radiation Therapy    Adjuvant radiation therapy      09/14/2014 -  Anti-estrogen oral therapy    Anastrozole 1 mg daily      05/28/2015 Imaging    CT scans: Enlarging  subpectoral masses 3.1 x 3.5 cm, posteriorly lower density mass 4.5 x 2.1 cm, several right-sided lung nodules right lower lobe 1.4 cm, 3 other right lung nodules, 1.7 cm right iliac bone lesion      06/21/2015 Procedure    Left subpectoral mass biopsy: Invasive high-grade ductal carcinoma ER 5%, PR 0%, HER-2 negative ratio 1.29      09/01/2015 Imaging    Left chest wall mass increased in size 7.2 x 5.1 cm, multiple subcutaneous nodules, increase in the lung nodules both in number as well as in the size of existing nodules      09/10/2015 -  Chemotherapy    Carboplatin, gemcitabine days 1 and 8 q 3 weeks, carboplatin discontinued for neuropathy (treatment break from 01/20/2016 to 03/24/2016); Added Carboplatin back 04/14/16 (for progression)      01/26/2016 Imaging    Marked improvement in size of lung nodules with many of the nodules resolved index right lower lobe nodule 1.9 x 1.8 cm is now 0.7 x 0.6 cm, reduction in the size of left breast mass 7.2 cm down to 2.6 cm, enlargement in the hypodense mass in the uterus      04/06/2016 Imaging    CT chest: Interval progression of pre-existing lung nodules new lung metastases (39m, 186m 2.9 cm, 1.6 cm), interval progression of disease in the left breast 4.2 cm (was 2.6 cm), additional nodules 2.3 cm and 3.2  cm; CT head: scalp mass 1.9 cm      06/27/2016 Imaging    Ct chest: Stable lesions in lt breast deep to Left pectoralis, cystic lesion loc ant in left breast inc compared to previous; decrease in lung nodules (68m to 3 mm; 12 mm to 7 mm, RML nodule 2.9 cm to 1.8 cm, RLL 139mto 7 mm)      11/14/2016 Imaging    CT CAP:RP and retrocrural lymphadenopathy, large uterine fibroids with necrosis; MRI Abd: RP lymphadenopathy CT chest 11/20/16: Worsening bulky conglomeration of mediastinal and right hilar lymph nodes, lung nodules subcentimeter size slight increase, left breast mass was 3.7 x 5.8 cm now 5.4 x 7.4 cm      12/08/2016 - 04/06/2017  Chemotherapy    Palliative chemotherapy with [place] days 1 and 8 every 3 weeks stopped due to peripheral neuropathy       04/30/2017 - 05/18/2017 Radiation Therapy    Palliative radiation therapy to the scalp and the left chest wall lesion      06/11/2017 Imaging    Progression noted with new and progressing pulmonary nodules, increasing size of periaortic mass, increasing iliac adenopathy with mass lesion difficult to separate from adjacent psoas muscle, new 1558module in the LUQ anterior to the colon.        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  Is been going on for about a week.  She was worried about blood clot.  She also feels fatigued and weak.  Denies any palpitations or lightheadedness or dizziness.  REVIEW OF SYSTEMS:   Constitutional: Complains of fatigue and weakness Eyes: Denies blurriness of vision Ears, nose, mouth, throat, and face: Denies mucositis or sore throat, lesion on the scalp Respiratory: Denies cough, dyspnea or wheezes Cardiovascular: Denies palpitation, chest discomfort Gastrointestinal:  Denies nausea, heartburn or change in bowel habits Skin: Denies abnormal skin rashes Lymphatics: Denies new lymphadenopathy or easy bruising Neurological:Denies numbness, tingling or new weaknesses Behavioral/Psych: Mood is stable, no new changes  Extremities: No lower extremity edema Breast: Left chest wall mass All other systems were reviewed  with the patient and are negative.  I have reviewed the past medical history, past surgical history, social history and family history with the patient and they are unchanged from previous note.  ALLERGIES:  is allergic to codeine; morphine and related; and oxycodone hcl.  MEDICATIONS:  Current Outpatient Medications  Medication Sig Dispense Refill  . acetaminophen (TYLENOL) 500 MG tablet Take 500 mg by mouth every 6 (six) hours as needed.    . B Complex-C (SUPER B COMPLEX PO) Take 1 tablet by mouth daily.    . calcium-vitamin D (OSCAL-500) 500-400 MG-UNIT tablet Take 1 tablet by mouth 2 (two) times daily. (Patient taking differently: Take 1 tablet by mouth 2 (two) times daily. Pt takes once daily.) 60 tablet 3  . Clobetasol Prop Emollient Base (CLOBETASOL PROPIONATE E) 0.05 % emollient cream Apply 1 application 2 (two) times daily topically. 30[place]uma  g 0  . docusate sodium (COLACE) 100 MG capsule Take 1 capsule (100 mg total) by mouth 2 (two) times daily. 10 capsule 0  . fentaNYL (DURAGESIC - DOSED MCG/HR) 25 MCG/HR patch Place 1 patch (25 mcg total) every 3 (three) days onto the skin. 10 patch 0  . gabapentin (NEURONTIN) 300 MG capsule TAKE 2 CAPSULES (600 MG TOTAL) BY MOUTH 3 (THREE) TIMES DAILY. 180 capsule 2  . HYDROmorphone (DILAUDID) 4 MG tablet Take 1 tablet (4 mg total) every 4 (four) hours as needed by mouth for severe pain. 120 tablet 0  . ibuprofen (ADVIL,MOTRIN) 600 MG tablet Take 1 tablet (600 mg total) by mouth every 8 (eight) hours as needed (mild pain). (Patient not taking: Reported on 06/26/2017) 30 tablet 0  . LORazepam (ATIVAN) 1 MG tablet TAKE 1 TABLET BY MOUTH EVERYDAY AT BEDTIME 30 tablet 2  . nystatin (MYCOSTATIN) 100000 UNIT/ML suspension Take 5 mLs (500,000 Units total) by mouth 4 (four) times daily. (Patient not taking: Reported on 06/26/2017) 60 mL 0  . olaparib (LYNPARZA) 100 MG tablet Take 2 tablets (200 mg total) by mouth 2 (two) times daily. Swallow whole with food. 120  tablet 0  . polyethylene glycol (MIRALAX / GLYCOLAX) packet Take 17 g by mouth 2 (two) times daily. 14 each 0  . prochlorperazine (COMPAZINE) 10 MG tablet Take 1 tablet (10 mg total) by mouth every 6 (six) hours as needed (Nausea or vomiting). (Patient not taking: Reported on 06/26/2017) 30 tablet 1  . zolpidem (AMBIEN) 10 MG tablet Take 1 tablet (10 mg total) by mouth at bedtime. 30 tablet 2   No current facility-administered medications for this visit.     PHYSICAL EXAMINATION: ECOG PERFORMANCE STATUS: 2 - Symptomatic, <50% confined to bed  Vitals:   07/03/17 1349  BP: 113/72  Pulse: 93  Resp: 17  Temp: 98.3 F (36.8 C)  SpO2: 100%   Filed Weights   07/03/17 1349  Weight: 115 lb (52.2 kg)    GENERAL:alert, no distress and comfortable SKIN: skin color, texture, turgor are normal, no rashes or significant lesions EYES: normal, Conjunctiva are pink and non-injected, sclera clear OROPHARYNX:no exudate, no erythema and lips, buccal mucosa, and tongue normal  NECK: supple, thyroid normal size, non-tender, without nodularity LYMPH:  no palpable lymphadenopathy in the cervical, axillary or inguinal LUNGS: clear to auscultation and percussion with normal breathing effort HEART: regular rate & rhythm and no murmurs and no lower extremity edema ABDOMEN:abdomen soft, non-tender and normal bowel sounds MUSCULOSKELETAL:no cyanosis of digits and no clubbing  NEURO: alert & oriented x 3 with fluent speech, no focal motor/sensory deficits EXTREMITIES: No lower extremity edema BREAST large left chest wall mass (exam performed in the presence of a chaperone)  LABORATORY DATA:  I have reviewed the data as listed   Chemistry      Component Value Date/Time   NA 133 (L) 07/03/2017 1321   K 4.1 07/03/2017 1321   CL 101 06/11/2017 1653   CO2 27 07/03/2017 1321   BUN 12.7 07/03/2017 1321   CREATININE 0.8 07/03/2017 1321      Component Value Date/Time   CALCIUM 9.8 07/03/2017 1321    ALKPHOS 59 07/03/2017 1321   AST 30 07/03/2017 1321   ALT 14 07/03/2017 1321   BILITOT 0.37 07/03/2017 1321       Lab Results  Component Value Date   WBC 9.2 07/03/2017   HGB 5.9 (LL) 07/03/2017   HCT 19.4 (L) 07/03/2017  MCV 67.8 (L) 07/03/2017   PLT 394 07/03/2017   NEUTROABS 6.9 (H) 07/03/2017    ASSESSMENT & PLAN:  Bilateral breast cancer ([place]) Bilateral breast cancers are usually diagnosed 2015 treated with surgery, radiation, anastrozole Metastatic breast cancer diagnosed 05/28/2015 as subpectoral mass, ER 5%, PR 0%, HER-2 negative, failed Xeloda and carboplatin and gemcitabine  CT CAP04/17/2017:RP and retrocrural lymphadenopathy, large uterine fibroids with necrosis;  MRI Abd: RP lymphadenopathy CT chest 11/20/16: Worsening bulky conglomeration of mediastinal and right hilar lymph nodes, lung nodules subcentimeter size slight increase, left breast mass was 3.7 x 5.8 cm now 5.4 x 7.4 cm CT scan on 11/12 indicates progression.    Treatment: Olaparib started 06/26/2017 1.  Leg swelling: Will obtain ultrasound of the legs to evaluate for DVT 2. severe anemia: Patient will need 2 units of blood transfusion which will be arranged for tomorrow.  I called the patient to inform her of her hemoglobin and left a voicemail.  Return to clinic in 2 weeks for recheck of her labs and follow-up   I spent 25 minutes talking to the patient of which more than half was spent in counseling and coordination of care.  No orders of the defined types were placed in this encounter.  The patient has a good understanding of the overall plan. she agrees with it. she will call with any problems that may develop before the next visit here.   Rulon Eisenmenger, MD 07/03/17

## 2017-07-03 NOTE — Telephone Encounter (Signed)
I called Paula Navarro to inform her that her hemoglobin is 5.9 and that she needs blood transfusion. Her mobile phone does not take any messages. Her home number is wrong. We will try to return her mobile number again to see if she picks it up so that she can come for blood transfusion.

## 2017-07-04 ENCOUNTER — Ambulatory Visit (HOSPITAL_COMMUNITY)
Admission: RE | Admit: 2017-07-04 | Discharge: 2017-07-04 | Disposition: A | Discharge status: Home / Self Care | Payer: Medicare Other | Source: Ambulatory Visit | Attending: Hematology and Oncology | Admitting: Hematology and Oncology

## 2017-07-04 ENCOUNTER — Encounter (HOSPITAL_COMMUNITY): Payer: Self-pay

## 2017-07-04 ENCOUNTER — Telehealth: Payer: Self-pay

## 2017-07-04 DIAGNOSIS — C50012 Malignant neoplasm of nipple and areola, left female breast: Secondary | ICD-10-CM | POA: Diagnosis not present

## 2017-07-04 DIAGNOSIS — M7989 Other specified soft tissue disorders: Secondary | ICD-10-CM | POA: Insufficient documentation

## 2017-07-04 DIAGNOSIS — C50011 Malignant neoplasm of nipple and areola, right female breast: Secondary | ICD-10-CM | POA: Insufficient documentation

## 2017-07-04 NOTE — Telephone Encounter (Signed)
Pt lvm returning our call. When attempted to call back, there was no answer.

## 2017-07-04 NOTE — Progress Notes (Signed)
Right lower extremity venous duplex has been completed. Negative for DVT. Results were given to [place] at Dr. Geralyn Flash office.  07/04/17 11:41 AM Carlos Levering RVT

## 2017-07-04 NOTE — Telephone Encounter (Signed)
S/w pt per need of blood transfusion. She stated it was already set up for tomorrow.

## 2017-07-05 ENCOUNTER — Ambulatory Visit (HOSPITAL_BASED_OUTPATIENT_CLINIC_OR_DEPARTMENT_OTHER): Payer: Medicare Other

## 2017-07-05 ENCOUNTER — Other Ambulatory Visit: Payer: Self-pay

## 2017-07-05 DIAGNOSIS — D649 Anemia, unspecified: Secondary | ICD-10-CM | POA: Diagnosis present

## 2017-07-05 DIAGNOSIS — C50919 Malignant neoplasm of unspecified site of unspecified female breast: Secondary | ICD-10-CM

## 2017-07-05 LAB — PREPARE RBC (CROSSMATCH)

## 2017-07-05 MED ORDER — HEPARIN SOD (PORK) LOCK FLUSH 100 UNIT/ML IV SOLN
250.0000 [IU] | INTRAVENOUS | Status: DC | PRN
  Filled 2017-07-05: qty 5

## 2017-07-05 MED ORDER — ACETAMINOPHEN 325 MG PO TABS
ORAL_TABLET | ORAL | Status: AC
  Filled 2017-07-05: qty 2

## 2017-07-05 MED ORDER — DIPHENHYDRAMINE HCL 25 MG PO CAPS
ORAL_CAPSULE | ORAL | Status: AC
  Filled 2017-07-05: qty 1

## 2017-07-05 MED ORDER — SODIUM CHLORIDE 0.9% FLUSH
3.0000 mL | INTRAVENOUS | Status: DC | PRN
  Filled 2017-07-05: qty 10

## 2017-07-05 MED ORDER — FENTANYL 25 MCG/HR TD PT72: 25.0000 ug | MEDICATED_PATCH | TRANSDERMAL | 0 refills | Status: DC

## 2017-07-05 MED ORDER — ACETAMINOPHEN 325 MG PO TABS
650.0000 mg | ORAL_TABLET | Freq: Once | ORAL | Status: AC
  Administered 2017-07-05: 650 mg via ORAL

## 2017-07-05 MED ORDER — HEPARIN SOD (PORK) LOCK FLUSH 100 UNIT/ML IV SOLN
500.0000 [IU] | Freq: Once | INTRAVENOUS | Status: DC
  Filled 2017-07-05: qty 5

## 2017-07-05 MED ORDER — DIPHENHYDRAMINE HCL 25 MG PO CAPS
25.0000 mg | ORAL_CAPSULE | Freq: Once | ORAL | Status: AC
  Administered 2017-07-05: 25 mg via ORAL

## 2017-07-05 MED ORDER — SODIUM CHLORIDE 0.9% FLUSH
10.0000 mL | Freq: Once | INTRAVENOUS | Status: AC
  Administered 2017-07-05: 10 mL via INTRAVENOUS
  Filled 2017-07-05: qty 10

## 2017-07-05 MED ORDER — SODIUM CHLORIDE 0.9 % IV SOLN
250.0000 mL | Freq: Once | INTRAVENOUS | Status: AC
Start: 2017-07-05 — End: 2017-07-05
  Administered 2017-07-05: 250 mL via INTRAVENOUS

## 2017-07-05 NOTE — Patient Instructions (Signed)

## 2017-07-06 ENCOUNTER — Telehealth: Payer: Self-pay | Admitting: Hematology and Oncology

## 2017-07-06 LAB — BPAM RBC
BLOOD PRODUCT EXPIRATION DATE: 201812282359
BLOOD PRODUCT EXPIRATION DATE: 201812282359
ISSUE DATE / TIME: 201812061147
ISSUE DATE / TIME: 201812061147
UNIT TYPE AND RH: 7300
UNIT TYPE AND RH: 7300

## 2017-07-06 LAB — TYPE AND SCREEN
ABO/RH(D): AB POS
Antibody Screen: NEGATIVE
DONOR AG TYPE: NEGATIVE
Donor AG Type: NEGATIVE
UNIT DIVISION: 0
UNIT DIVISION: 0

## 2017-07-06 NOTE — Telephone Encounter (Signed)
Spoke to patient regarding upcoming December appointments.  °

## 2017-07-09 ENCOUNTER — Other Ambulatory Visit: Payer: Medicare Other

## 2017-07-09 ENCOUNTER — Ambulatory Visit: Payer: Medicare Other | Admitting: Hematology and Oncology

## 2017-07-10 ENCOUNTER — Other Ambulatory Visit: Payer: Self-pay | Admitting: *Deleted

## 2017-07-10 MED ORDER — HYDROMORPHONE HCL 4 MG PO TABS: 4.0000 mg | ORAL_TABLET | ORAL | 0 refills | Status: DC | PRN

## 2017-07-10 MED FILL — HYDROmorphone HCL 4 MG TABS: 4 | 20 days supply | Qty: 120 | Fill #0

## 2017-07-10 NOTE — Telephone Encounter (Signed)
Prescription for hydromorphone picked up by April Herring / [facility] # [id number],

## 2017-07-13 ENCOUNTER — Other Ambulatory Visit: Payer: Self-pay | Admitting: Hematology and Oncology

## 2017-07-13 DIAGNOSIS — C50011 Malignant neoplasm of nipple and areola, right female breast: Secondary | ICD-10-CM

## 2017-07-13 DIAGNOSIS — C50012 Malignant neoplasm of nipple and areola, left female breast: Principal | ICD-10-CM

## 2017-07-17 ENCOUNTER — Other Ambulatory Visit: Payer: Medicare Other

## 2017-07-18 ENCOUNTER — Telehealth: Payer: Self-pay | Admitting: Hematology and Oncology

## 2017-07-18 ENCOUNTER — Telehealth: Payer: Self-pay | Admitting: Medical Oncology

## 2017-07-18 ENCOUNTER — Encounter: Payer: Self-pay | Admitting: Medical Oncology

## 2017-07-18 NOTE — Telephone Encounter (Signed)
Patient called in to schedule appointment that was missed

## 2017-07-18 NOTE — Telephone Encounter (Signed)
Pt LVM -needs letter for transportation tomorrow for appt. Letter faxed to [facility]. Unable to reach pt to let her know this was done.

## 2017-07-19 ENCOUNTER — Other Ambulatory Visit: Payer: Self-pay

## 2017-07-19 ENCOUNTER — Inpatient Hospital Stay (HOSPITAL_COMMUNITY)
Admission: EM | Admit: 2017-07-19 | Discharge: 2017-07-23 | DRG: 812 | Disposition: A | Discharge status: Home Health | Payer: Medicare Other | Attending: Family Medicine | Admitting: Family Medicine

## 2017-07-19 ENCOUNTER — Other Ambulatory Visit (HOSPITAL_BASED_OUTPATIENT_CLINIC_OR_DEPARTMENT_OTHER): Payer: Medicare Other

## 2017-07-19 ENCOUNTER — Emergency Department (HOSPITAL_COMMUNITY): Payer: Medicare Other

## 2017-07-19 ENCOUNTER — Ambulatory Visit (HOSPITAL_BASED_OUTPATIENT_CLINIC_OR_DEPARTMENT_OTHER): Payer: Medicare Other

## 2017-07-19 ENCOUNTER — Encounter: Payer: Self-pay | Admitting: General Practice

## 2017-07-19 ENCOUNTER — Telehealth: Payer: Self-pay

## 2017-07-19 ENCOUNTER — Encounter (HOSPITAL_COMMUNITY): Payer: Self-pay | Admitting: *Deleted

## 2017-07-19 DIAGNOSIS — J449 Chronic obstructive pulmonary disease, unspecified: Secondary | ICD-10-CM | POA: Diagnosis present

## 2017-07-19 DIAGNOSIS — R1030 Lower abdominal pain, unspecified: Secondary | ICD-10-CM

## 2017-07-19 DIAGNOSIS — C50011 Malignant neoplasm of nipple and areola, right female breast: Secondary | ICD-10-CM

## 2017-07-19 DIAGNOSIS — D649 Anemia, unspecified: Secondary | ICD-10-CM | POA: Diagnosis not present

## 2017-07-19 DIAGNOSIS — C50919 Malignant neoplasm of unspecified site of unspecified female breast: Secondary | ICD-10-CM | POA: Diagnosis present

## 2017-07-19 DIAGNOSIS — G8929 Other chronic pain: Secondary | ICD-10-CM | POA: Diagnosis present

## 2017-07-19 DIAGNOSIS — R6510 Systemic inflammatory response syndrome (SIRS) of non-infectious origin without acute organ dysfunction: Secondary | ICD-10-CM | POA: Diagnosis present

## 2017-07-19 DIAGNOSIS — C786 Secondary malignant neoplasm of retroperitoneum and peritoneum: Secondary | ICD-10-CM | POA: Diagnosis present

## 2017-07-19 DIAGNOSIS — L989 Disorder of the skin and subcutaneous tissue, unspecified: Secondary | ICD-10-CM

## 2017-07-19 DIAGNOSIS — Z66 Do not resuscitate: Secondary | ICD-10-CM | POA: Diagnosis present

## 2017-07-19 DIAGNOSIS — Y92230 Patient room in hospital as the place of occurrence of the external cause: Secondary | ICD-10-CM | POA: Diagnosis present

## 2017-07-19 DIAGNOSIS — Z9049 Acquired absence of other specified parts of digestive tract: Secondary | ICD-10-CM

## 2017-07-19 DIAGNOSIS — M545 Low back pain: Secondary | ICD-10-CM | POA: Diagnosis present

## 2017-07-19 DIAGNOSIS — K625 Hemorrhage of anus and rectum: Secondary | ICD-10-CM

## 2017-07-19 DIAGNOSIS — K921 Melena: Secondary | ICD-10-CM | POA: Diagnosis present

## 2017-07-19 DIAGNOSIS — Z803 Family history of malignant neoplasm of breast: Secondary | ICD-10-CM

## 2017-07-19 DIAGNOSIS — R002 Palpitations: Secondary | ICD-10-CM | POA: Diagnosis present

## 2017-07-19 DIAGNOSIS — C50012 Malignant neoplasm of nipple and areola, left female breast: Principal | ICD-10-CM

## 2017-07-19 DIAGNOSIS — D62 Acute posthemorrhagic anemia: Secondary | ICD-10-CM | POA: Diagnosis not present

## 2017-07-19 DIAGNOSIS — E871 Hypo-osmolality and hyponatremia: Secondary | ICD-10-CM | POA: Diagnosis present

## 2017-07-19 DIAGNOSIS — Z79899 Other long term (current) drug therapy: Secondary | ICD-10-CM

## 2017-07-19 DIAGNOSIS — G620 Drug-induced polyneuropathy: Secondary | ICD-10-CM | POA: Diagnosis present

## 2017-07-19 DIAGNOSIS — Z515 Encounter for palliative care: Secondary | ICD-10-CM | POA: Diagnosis not present

## 2017-07-19 DIAGNOSIS — T451X5A Adverse effect of antineoplastic and immunosuppressive drugs, initial encounter: Secondary | ICD-10-CM | POA: Diagnosis present

## 2017-07-19 DIAGNOSIS — Z79891 Long term (current) use of opiate analgesic: Secondary | ICD-10-CM

## 2017-07-19 DIAGNOSIS — E8809 Other disorders of plasma-protein metabolism, not elsewhere classified: Secondary | ICD-10-CM | POA: Diagnosis present

## 2017-07-19 DIAGNOSIS — B0229 Other postherpetic nervous system involvement: Secondary | ICD-10-CM | POA: Diagnosis present

## 2017-07-19 DIAGNOSIS — Z791 Long term (current) use of non-steroidal anti-inflammatories (NSAID): Secondary | ICD-10-CM

## 2017-07-19 DIAGNOSIS — Z90710 Acquired absence of both cervix and uterus: Secondary | ICD-10-CM

## 2017-07-19 DIAGNOSIS — R109 Unspecified abdominal pain: Secondary | ICD-10-CM | POA: Diagnosis present

## 2017-07-19 DIAGNOSIS — C7801 Secondary malignant neoplasm of right lung: Secondary | ICD-10-CM

## 2017-07-19 DIAGNOSIS — C7951 Secondary malignant neoplasm of bone: Secondary | ICD-10-CM

## 2017-07-19 DIAGNOSIS — G4700 Insomnia, unspecified: Secondary | ICD-10-CM | POA: Diagnosis present

## 2017-07-19 DIAGNOSIS — Z87891 Personal history of nicotine dependence: Secondary | ICD-10-CM

## 2017-07-19 DIAGNOSIS — F419 Anxiety disorder, unspecified: Secondary | ICD-10-CM | POA: Diagnosis present

## 2017-07-19 DIAGNOSIS — Z9221 Personal history of antineoplastic chemotherapy: Secondary | ICD-10-CM

## 2017-07-19 HISTORY — DX: Malignant neoplasm of endometrium: C54.1

## 2017-07-19 LAB — CBC WITH DIFFERENTIAL/PLATELET
BASO%: 0.5 % (ref 0.0–2.0)
Basophils Absolute: 0 10*3/uL (ref 0.0–0.1)
Basophils Absolute: 0 10*3/uL (ref 0.0–0.1)
Basophils Relative: 0 %
EOS ABS: 0 10*3/uL (ref 0.0–0.7)
EOS PCT: 0 %
EOS%: 0.5 % (ref 0.0–7.0)
Eosinophils Absolute: 0 10*3/uL (ref 0.0–0.5)
HCT: 23.2 % — ABNORMAL LOW (ref 36.0–46.0)
HEMATOCRIT: 21 % — AB (ref 34.8–46.6)
HEMOGLOBIN: 7.2 g/dL — AB (ref 12.0–15.0)
HGB: 6.7 g/dL — CL (ref 11.6–15.9)
LYMPH#: 1.1 10*3/uL (ref 0.9–3.3)
LYMPH%: 11 % — AB (ref 14.0–49.7)
LYMPHS ABS: 1 10*3/uL (ref 0.7–4.0)
LYMPHS PCT: 10 %
MCH: 23.2 pg — ABNORMAL LOW (ref 25.1–34.0)
MCH: 23.2 pg — AB (ref 26.0–34.0)
MCHC: 31.8 g/dL (ref 31.5–36.0)
MCHC: 31 g/dL (ref 30.0–36.0)
MCV: 73 fL — ABNORMAL LOW (ref 79.5–101.0)
MCV: 74.6 fL — AB (ref 78.0–100.0)
MONO#: 0.7 10*3/uL (ref 0.1–0.9)
MONO%: 6.9 % (ref 0.0–14.0)
MONOS PCT: 8 %
Monocytes Absolute: 0.7 10*3/uL (ref 0.1–1.0)
NEUT%: 81.1 % — AB (ref 38.4–76.8)
NEUTROS ABS: 8.3 10*3/uL — AB (ref 1.5–6.5)
Neutro Abs: 8.1 10*3/uL — ABNORMAL HIGH (ref 1.7–7.7)
Neutrophils Relative %: 82 %
PLATELETS: 422 10*3/uL — AB (ref 150–400)
Platelets: 444 10*3/uL — ABNORMAL HIGH (ref 145–400)
RBC: 2.88 10*6/uL — AB (ref 3.70–5.45)
RBC: 3.11 MIL/uL — ABNORMAL LOW (ref 3.87–5.11)
RDW: 24.6 % — ABNORMAL HIGH (ref 11.2–14.5)
RDW: 23.6 % — ABNORMAL HIGH (ref 11.5–15.5)
WBC: 10.3 10*3/uL (ref 3.9–10.3)
WBC: 9.9 10*3/uL (ref 4.0–10.5)

## 2017-07-19 LAB — TSH: TSH: 0.544 m(IU)/L (ref 0.308–3.960)

## 2017-07-19 LAB — COMPREHENSIVE METABOLIC PANEL
ALBUMIN: 2.5 g/dL — AB (ref 3.5–5.0)
ALK PHOS: 64 U/L (ref 40–150)
ALK PHOS: 55 U/L (ref 38–126)
ALT: 12 U/L (ref 0–55)
ALT: 13 U/L — AB (ref 14–54)
ANION GAP: 12 (ref 5–15)
AST: 30 U/L (ref 5–34)
AST: 31 U/L (ref 15–41)
Albumin: 2.5 g/dL — ABNORMAL LOW (ref 3.5–5.0)
Anion Gap: 10 mEq/L (ref 3–11)
BILIRUBIN TOTAL: 0.42 mg/dL (ref 0.20–1.20)
BUN: 14 mg/dL (ref 7.0–26.0)
BUN: 19 mg/dL (ref 6–20)
CALCIUM: 9.6 mg/dL (ref 8.4–10.4)
CALCIUM: 9.2 mg/dL (ref 8.9–10.3)
CO2: 27 mEq/L (ref 22–29)
CO2: 24 mmol/L (ref 22–32)
CREATININE: 0.69 mg/dL (ref 0.44–1.00)
Chloride: 98 mEq/L (ref 98–109)
Chloride: 95 mmol/L — ABNORMAL LOW (ref 101–111)
Creatinine: 0.8 mg/dL (ref 0.6–1.1)
GFR calc Af Amer: >60 mL/min (ref >60)
GLUCOSE: 104 mg/dL (ref 70–140)
Glucose, Bld: 119 mg/dL — ABNORMAL HIGH (ref 65–99)
Potassium: 3.9 mEq/L (ref 3.5–5.1)
Potassium: 3.9 mmol/L (ref 3.5–5.1)
SODIUM: 134 meq/L — AB (ref 136–145)
SODIUM: 131 mmol/L — AB (ref 135–145)
TOTAL PROTEIN: 7 g/dL (ref 6.4–8.3)
Total Bilirubin: 0.6 mg/dL (ref 0.3–1.2)
Total Protein: 6.7 g/dL (ref 6.5–8.1)

## 2017-07-19 LAB — LIPASE, BLOOD: Lipase: 19 U/L (ref 11–51)

## 2017-07-19 LAB — I-STAT CG4 LACTIC ACID, ED: Lactic Acid, Venous: 0.98 mmol/L (ref 0.5–1.9)

## 2017-07-19 MED ORDER — SODIUM CHLORIDE 0.9% FLUSH
10.0000 mL | INTRAVENOUS | Status: DC | PRN
  Administered 2017-07-19: 10 mL via INTRAVENOUS
  Filled 2017-07-19: qty 10

## 2017-07-19 MED ORDER — SODIUM CHLORIDE 0.9 % IV BOLUS (SEPSIS)
500.0000 mL | Freq: Once | INTRAVENOUS | Status: AC
  Administered 2017-07-19: 500 mL via INTRAVENOUS

## 2017-07-19 MED ORDER — SODIUM CHLORIDE 0.9 % IV SOLN: 10.0000 mL/h | Freq: Once | INTRAVENOUS | Status: DC

## 2017-07-19 MED ORDER — HEPARIN SOD (PORK) LOCK FLUSH 100 UNIT/ML IV SOLN
500.0000 [IU] | Freq: Once | INTRAVENOUS | Status: AC | PRN
Start: 2017-07-19 — End: 2017-07-19
  Administered 2017-07-19: 500 [IU] via INTRAVENOUS
  Filled 2017-07-19: qty 5

## 2017-07-19 NOTE — ED Triage Notes (Addendum)
Pt states abdominal pain & weakness for the past 2 days. Says she is scheduled for blood transfusion Sat. Pt took Dilaudid & tylenol before coming to the ER.

## 2017-07-19 NOTE — ED Provider Notes (Signed)
[facility] EMERGENCY DEPARTMENT Provider Note   CSN: [id number] Arrival date & time: 07/19/17  2159     History   Chief Complaint Chief Complaint  Patient presents with  . Abdominal Pain  . Weakness    HPI Paula Navarro is a 66 y.o. female.  HPI  Pt with hx of metastatic cancer and chronic pain presents for generalized weakness, fatigue and ongoing abdominal pain. States she has had burning with urination, lightheadedness.  Denies vomiting or diarrhea.  Patient has anemia and is scheduled for blood transfusion on Saturday.  Hemoglobin today was 6.7.  Denies any active bleeding.  No melanotic or grossly bloody stools.  Past Medical History:  Diagnosis Date  . Anemia   . Anxiety   . Atrial septal defect 1996   Surgical repair in 1996  . Breast cancer ([place]) dx'd 06/2013  . Chest pain    Admitted to [facility] in 09/2011; refused stress test  . Chronic bronchitis   . Chronic pain   . COPD (chronic obstructive pulmonary disease) ([place])    on xray  . Metastatic cancer to lung ([place]) dx'd 2017  . Palpitation    Tachycardia reported by monitor clerk during a symptomatic spell  . Radiation 06/30/14-08/17/14   Bilateral Breast  . Tobacco abuse    60 pack years; 1.5 packs per day  . Wears dentures    top    Patient Active Problem List   Diagnosis Date Noted  . Acute post-operative pain 05/10/2017  . Endometrial cancer ([place]) 12/18/2016  . Protein-calorie malnutrition, severe 11/15/2016  . UTI (urinary tract infection) 11/14/2016  . COPD (chronic obstructive pulmonary disease) ([place])   . 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 Chemotherapy-induced thrombocytopenia 06/13/2016  . Scalp lesion 04/01/2016  . Encounter for central line care 12/08/2015  . Port catheter in place 11/19/2015  . Insomnia 11/01/2015  . Microcytic anemia 09/28/2015  . Lung metastases ([place]) 09/10/2015  . Bone metastases ([place]) 07/20/2015  . Goals of care, counseling/discussion  07/13/2015  . Metastatic breast cancer ([place]) 06/29/2015  . Chronic pain 03/16/2015  . Vaginal bleeding 09/24/2014  . Postherpetic neuralgia 09/03/2014  . Lymphedema 09/03/2014  . Suspected herpes zoster left C5 distribution 08/14/2014  . Neuropathy due to chemotherapeutic drug ([place]) 03/03/2014  . Anxiety 03/03/2014  . Bilateral breast cancer ([place]) 08/11/2013  . Palpitation   . Chest pain   . Atrial septal defect   . Laboratory test 11/25/2011  . Chronic bronchitis   . Tobacco abuse     Past Surgical History:  Procedure Laterality Date  . ASD REPAIR, OSTIUM PRIMUM  1996   dr Roxy Horseman  . AXILLARY LYMPH NODE DISSECTION Bilateral 04/08/2014   Procedure:  BILATERAL AXILLARY LYMPH NODE DISSECTION;  Surgeon: Autumn Messing III, MD;  Location: [facility];  Service: General;  Laterality: Bilateral;  . BREAST BIOPSY Bilateral   . BREAST LUMPECTOMY WITH RADIOACTIVE SEED LOCALIZATION Bilateral 04/08/2014   Procedure: BILATERAL  RADIOACTIVE SEED LOCALIZATION LUMPECTOMY ;  Surgeon: Autumn Messing III, MD;  Location: [facility];  Service: General;  Laterality: Bilateral;  . CESAREAN SECTION     x3  . CHOLECYSTECTOMY    . LAPAROTOMY N/A 02/13/2017   Procedure: MINI EXPLORATORY LAPAROTOMY;  Surgeon: Everitt Amber, MD;  Location: [facility];  Service: Gynecology;  Laterality: N/A;  . open heart surgery    . PORT A CATH REVISION  1/15   put in   . ROBOTIC ASSISTED TOTAL  HYSTERECTOMY WITH BILATERAL SALPINGO OOPHERECTOMY Bilateral 02/13/2017   Procedure: XI ROBOTIC ASSISTED TOTAL HYSTERECTOMY WITH BILATERAL SALPINGO OOPHORECTOMY WITH LYSIS OF ADHESIONS, UTERUS GREATER THAN 250GRAMS;  Surgeon: Everitt Amber, MD;  Location: [facility];  Service: Gynecology;  Laterality: Bilateral;  . TUBAL LIGATION      OB History    Gravida Para Term Preterm AB Living   4 3 3          SAB TAB Ectopic Multiple Live Births                   Home Medications    Prior to Admission medications   Medication  Sig Start Date End Date Taking? Authorizing Provider  acetaminophen (TYLENOL) 500 MG tablet Take 500 mg by mouth every 6 (six) hours as needed.   Yes [provider]  B Complex-C (SUPER B COMPLEX PO) Take 1 tablet by mouth daily.   Yes [provider]  calcium-vitamin D (OSCAL-500) 500-400 MG-UNIT tablet Take 1 tablet by mouth 2 (two) times daily. Patient taking differently: Take 1 tablet by mouth 2 (two) times daily. Pt takes once daily. 08/18/15  Yes Nicholas Lose, MD  docusate sodium (COLACE) 100 MG capsule Take 1 capsule (100 mg total) by mouth 2 (two) times daily. Patient taking differently: Take 100 mg by mouth daily.  04/06/17  Yes Causey, Charlestine Massed, NP  fentaNYL (DURAGESIC - DOSED MCG/HR) 25 MCG/HR patch Place 1 patch (25 mcg total) onto the skin every 3 (three) days. 07/05/17  Yes Nicholas Lose, MD  gabapentin (NEURONTIN) 300 MG capsule TAKE 2 CAPSULES (600 MG TOTAL) BY MOUTH 3 (THREE) TIMES DAILY. 05/24/17  Yes Nicholas Lose, MD  HYDROmorphone (DILAUDID) 4 MG tablet Take 1 tablet (4 mg total) by mouth every 4 (four) hours as needed for severe pain. 07/10/17  Yes Nicholas Lose, MD  LORazepam (ATIVAN) 1 MG tablet TAKE 1 TABLET BY MOUTH EVERYDAY AT BEDTIME Patient taking differently: Take 1 mg by mouth at bedtime.  05/18/17  Yes Nicholas Lose, MD  LYNPARZA 100 MG tablet TAKE 2 TABLETS (200 MG TOTAL) BY MOUTH 2 (TWO) TIMES DAILY. SWALLOW WHOLE WITH FOOD. 07/13/17  Yes Nicholas Lose, MD  polyethylene glycol (MIRALAX / GLYCOLAX) packet Take 17 g by mouth 2 (two) times daily. 04/06/17  Yes Causey, Charlestine Massed, NP  prochlorperazine (COMPAZINE) 10 MG tablet Take 1 tablet (10 mg total) by mouth every 6 (six) hours as needed (Nausea or vomiting). 03/30/17  Yes Causey, Charlestine Massed, NP  zolpidem (AMBIEN) 10 MG tablet Take 1 tablet (10 mg total) by mouth at bedtime. Patient taking differently: Take 5-10 mg by mouth at bedtime.  06/01/17  Yes Nicholas Lose, MD  Clobetasol Prop  Emollient Base (CLOBETASOL PROPIONATE E) 0.05 % emollient cream Apply 1 application 2 (two) times daily topically. 06/15/17   Gardenia Phlegm, NP  ibuprofen (ADVIL,MOTRIN) 600 MG tablet Take 1 tablet (600 mg total) by mouth every 8 (eight) hours as needed (mild pain). Patient not taking: Reported on 06/26/2017 02/14/17   Everitt Amber, MD  nystatin (MYCOSTATIN) 100000 UNIT/ML suspension Take 5 mLs (500,000 Units total) by mouth 4 (four) times daily. Patient not taking: Reported on 06/26/2017 01/26/17   Gardenia Phlegm, NP    Family History Family History  Problem Relation Age of Onset  . Breast cancer Maternal Aunt     Social History Social History   Tobacco Use  . Smoking status: Former Smoker    Packs/day: 1.50    Years:  40.00    Pack years: 60.00    Types: Cigarettes    Last attempt to quit: 08/01/2012    Years since quitting: 4.9  . Smokeless tobacco: Never Used  Substance Use Topics  . Alcohol use: No  . Drug use: No     Allergies   Codeine; Morphine and related; and Oxycodone hcl   Review of Systems Review of Systems  Constitutional: Positive for fatigue. Negative for chills and fever.  HENT: Negative for sore throat.   Respiratory: Negative for cough and shortness of breath.   Cardiovascular: Negative for chest pain.  Gastrointestinal: Positive for abdominal pain. Negative for blood in stool, constipation, diarrhea, nausea and vomiting.  Genitourinary: Positive for dysuria. Negative for flank pain, frequency and hematuria.  Musculoskeletal: Negative for back pain, myalgias, neck pain and neck stiffness.  Skin: Negative for rash and wound.  Neurological: Positive for dizziness, weakness and light-headedness. Negative for headaches.  All other systems reviewed and are negative.    Physical Exam Updated Vital Signs BP 97/69 (BP Location: Right Arm)   Pulse 97   Temp (!) 100.5 F (38.1 C) (Oral)   Resp 16   Ht 5\' 2"  (1.575 m)   Wt 52.2 kg (115  lb)   LMP 05/20/2011   SpO2 98%   BMI 21.03 kg/m   Physical Exam  Constitutional: She is oriented to person, place, and time. She appears well-developed and well-nourished. No distress.  HENT:  Head: Normocephalic and atraumatic.  Mouth/Throat: Oropharynx is clear and moist. No oropharyngeal exudate.  Eyes: EOM are normal. Pupils are equal, round, and reactive to light.  Neck: Normal range of motion. Neck supple.  No meningismus  Cardiovascular: Normal rate and regular rhythm. Exam reveals no gallop and no friction rub.  No murmur heard. Pulmonary/Chest: Effort normal and breath sounds normal.  Abdominal: Soft. Bowel sounds are normal. There is no tenderness. There is no rebound and no guarding.  Abdomen is soft and nontender.  Musculoskeletal: Normal range of motion. She exhibits no edema or tenderness.  No CVA tenderness.  No lower extremity swelling or pain.  Neurological: She is alert and oriented to person, place, and time.  Moves all extremities without focal deficit.  Sensation fully intact.  Skin: Skin is warm and dry. Capillary refill takes less than 2 seconds. No rash noted. No erythema.  Psychiatric: She has a normal mood and affect. Her behavior is normal.  Nursing note and vitals reviewed.    ED Treatments / Results  Labs (all labs ordered are listed, but only abnormal results are displayed) Labs Reviewed  COMPREHENSIVE METABOLIC PANEL - Abnormal; Notable for the following components:      Result Value   Sodium 131 (*)    Chloride 95 (*)    Glucose, Bld 119 (*)    Albumin 2.5 (*)    ALT 13 (*)    All other components within normal limits  CULTURE, BLOOD (ROUTINE X 2)  CULTURE, BLOOD (ROUTINE X 2)  LIPASE, BLOOD  CBC WITH DIFFERENTIAL/PLATELET  URINALYSIS, ROUTINE W REFLEX MICROSCOPIC  I-STAT CG4 LACTIC ACID, ED  PREPARE RBC (CROSSMATCH)    EKG  EKG Interpretation None       Radiology No results found.  Procedures Procedures (including critical  care time)  Medications Ordered in ED Medications  0.9 %  sodium chloride infusion (not administered)  sodium chloride 0.9 % bolus 500 mL (500 mLs Intravenous New Bag/Given 07/19/17 2249)     Initial Impression /  Assessment and Plan / ED Course  I have reviewed the triage vital signs and the nursing notes.  Pertinent labs & imaging results that were available during my care of the patient were reviewed by me and considered in my medical decision making (see chart for details).     Patient is well-appearing.  Abdomen is soft and nontender.  States she took her oral Dilaudid prior to coming to the emergency department. Signed out to oncoming emergency physician pending laboratory workup and admission. Final Clinical Impressions(s) / ED Diagnoses   Final diagnoses:  None    ED Discharge Orders    None       Julianne Rice, MD 07/19/17 2315

## 2017-07-19 NOTE — Telephone Encounter (Signed)
Pt was here today for flush and labs. HGB is 6.7. Pt needs blood. Unable to find patient. Pt called in lobby. Looked outside, in bathrooms, as well as other areas. May have already left. Called pt cell phone and VM box is full. Home phone is not in service. Will try to call again.  Cyndia Bent RN

## 2017-07-19 NOTE — Progress Notes (Addendum)
[place] CSW Progress Note  CSW received request to assist patient w transport resources for Saturday medical procedure.  Spoke w patient, per patient she has friend willing to transport on Saturday, no concerns with getting to appointment.  Also reports she uses [employer] transport for weekday medical appointments, no concerns w this service.  Patient is awaiting call from RN to let her know time of appointment on Saturday.   Edwyna Shell, LCSW Clinical Social Worker Phone:  [phone]

## 2017-07-19 NOTE — Progress Notes (Signed)
Pt is able to have a friend bring her to appt on Saturday. Advised pt to be here at 8am for 2 units of blood. Orders are in.  Cyndia Bent RN

## 2017-07-20 ENCOUNTER — Encounter (HOSPITAL_COMMUNITY): Payer: Self-pay | Admitting: Internal Medicine

## 2017-07-20 ENCOUNTER — Other Ambulatory Visit: Payer: Self-pay

## 2017-07-20 DIAGNOSIS — E871 Hypo-osmolality and hyponatremia: Secondary | ICD-10-CM | POA: Diagnosis present

## 2017-07-20 DIAGNOSIS — R6510 Systemic inflammatory response syndrome (SIRS) of non-infectious origin without acute organ dysfunction: Secondary | ICD-10-CM | POA: Diagnosis present

## 2017-07-20 DIAGNOSIS — D649 Anemia, unspecified: Secondary | ICD-10-CM | POA: Diagnosis present

## 2017-07-20 LAB — BASIC METABOLIC PANEL
Anion gap: 10 (ref 5–15)
BUN: 18 mg/dL (ref 6–20)
CALCIUM: 8.8 mg/dL — AB (ref 8.9–10.3)
CO2: 24 mmol/L (ref 22–32)
CREATININE: 0.69 mg/dL (ref 0.44–1.00)
Chloride: 98 mmol/L — ABNORMAL LOW (ref 101–111)
GFR calc non Af Amer: >60 mL/min (ref >60)
Glucose, Bld: 108 mg/dL — ABNORMAL HIGH (ref 65–99)
Potassium: 4 mmol/L (ref 3.5–5.1)
Sodium: 132 mmol/L — ABNORMAL LOW (ref 135–145)

## 2017-07-20 LAB — CBC WITH DIFFERENTIAL/PLATELET
BASOS ABS: 0 10*3/uL (ref 0.0–0.1)
BASOS PCT: 0 %
EOS ABS: 0.1 10*3/uL (ref 0.0–0.7)
Eosinophils Relative: 1 %
HEMATOCRIT: 23.5 % — AB (ref 36.0–46.0)
Hemoglobin: 7.4 g/dL — ABNORMAL LOW (ref 12.0–15.0)
LYMPHS ABS: 1.7 10*3/uL (ref 0.7–4.0)
Lymphocytes Relative: 16 %
MCH: 24.4 pg — AB (ref 26.0–34.0)
MCHC: 31.5 g/dL (ref 30.0–36.0)
MCV: 77.6 fL — ABNORMAL LOW (ref 78.0–100.0)
MONOS PCT: 9 %
Monocytes Absolute: 1 10*3/uL (ref 0.1–1.0)
NEUTROS ABS: 8 10*3/uL — AB (ref 1.7–7.7)
Neutrophils Relative %: 74 %
Platelets: 359 10*3/uL (ref 150–400)
RBC: 3.03 MIL/uL — ABNORMAL LOW (ref 3.87–5.11)
RDW: 22.6 % — ABNORMAL HIGH (ref 11.5–15.5)
WBC: 10.8 10*3/uL — ABNORMAL HIGH (ref 4.0–10.5)

## 2017-07-20 LAB — URINALYSIS, ROUTINE W REFLEX MICROSCOPIC
BACTERIA UA: NONE SEEN
Bilirubin Urine: NEGATIVE
Glucose, UA: NEGATIVE mg/dL
Hgb urine dipstick: NEGATIVE
Ketones, ur: NEGATIVE mg/dL
Nitrite: NEGATIVE
PH: 5 (ref 5.0–8.0)
Protein, ur: NEGATIVE mg/dL
SPECIFIC GRAVITY, URINE: 1.02 (ref 1.005–1.030)

## 2017-07-20 LAB — CBC
HEMATOCRIT: 20.7 % — AB (ref 36.0–46.0)
HEMOGLOBIN: 6.7 g/dL — AB (ref 12.0–15.0)
MCH: 25.2 pg — ABNORMAL LOW (ref 26.0–34.0)
MCHC: 32.4 g/dL (ref 30.0–36.0)
MCV: 77.8 fL — ABNORMAL LOW (ref 78.0–100.0)
Platelets: 338 10*3/uL (ref 150–400)
RBC: 2.66 MIL/uL — AB (ref 3.87–5.11)
RDW: 20.1 % — ABNORMAL HIGH (ref 11.5–15.5)
WBC: 12.6 10*3/uL — ABNORMAL HIGH (ref 4.0–10.5)

## 2017-07-20 LAB — ABO/RH: ABO/RH(D): AB POS

## 2017-07-20 LAB — HEMOGLOBIN AND HEMATOCRIT, BLOOD
HEMATOCRIT: 25.3 % — AB (ref 36.0–46.0)
Hemoglobin: 8.1 g/dL — ABNORMAL LOW (ref 12.0–15.0)

## 2017-07-20 LAB — MAGNESIUM: Magnesium: 1.1 mg/dL — ABNORMAL LOW (ref 1.7–2.4)

## 2017-07-20 LAB — PHOSPHORUS: Phosphorus: 3.1 mg/dL (ref 2.5–4.6)

## 2017-07-20 LAB — PREPARE RBC (CROSSMATCH)

## 2017-07-20 MED ORDER — ENSURE ENLIVE PO LIQD
237.0000 mL | Freq: Two times a day (BID) | ORAL | Status: DC
  Administered 2017-07-20 – 2017-07-23 (×4): 237 mL via ORAL

## 2017-07-20 MED ORDER — PROCHLORPERAZINE MALEATE 5 MG PO TABS: 10.0000 mg | ORAL_TABLET | Freq: Four times a day (QID) | ORAL | Status: DC | PRN

## 2017-07-20 MED ORDER — LORAZEPAM 1 MG PO TABS: 1.0000 mg | ORAL_TABLET | Freq: Every day | ORAL | Status: DC

## 2017-07-20 MED ORDER — FENTANYL 25 MCG/HR TD PT72: 25.0000 ug | MEDICATED_PATCH | TRANSDERMAL | Status: DC

## 2017-07-20 MED ORDER — CALCIUM CARBONATE-VITAMIN D 500-200 MG-UNIT PO TABS
1.0000 | ORAL_TABLET | Freq: Every day | ORAL | Status: DC
  Administered 2017-07-20: 10:00:00 1 via ORAL
  Filled 2017-07-20 (×5): qty 1

## 2017-07-20 MED ORDER — HYDROMORPHONE HCL 4 MG PO TABS
4.0000 mg | ORAL_TABLET | ORAL | Status: DC | PRN
  Administered 2017-07-20 – 2017-07-21 (×3): 4 mg via ORAL
  Filled 2017-07-20 (×4): qty 1

## 2017-07-20 MED ORDER — ZOLPIDEM TARTRATE 5 MG PO TABS: 5.0000 mg | ORAL_TABLET | Freq: Every day | ORAL | Status: DC

## 2017-07-20 MED ORDER — HYDROMORPHONE HCL 1 MG/ML IJ SOLN
0.5000 mg | Freq: Once | INTRAMUSCULAR | Status: AC
  Administered 2017-07-20: 0.5 mg via INTRAVENOUS
  Filled 2017-07-20: qty 1

## 2017-07-20 MED ORDER — DEXTROSE 5 % IV SOLN
1.0000 g | INTRAVENOUS | Status: DC
  Administered 2017-07-20 – 2017-07-21 (×2): 1 g via INTRAVENOUS
  Filled 2017-07-20 (×4): qty 10

## 2017-07-20 MED ORDER — ACETAMINOPHEN 500 MG PO TABS
500.0000 mg | ORAL_TABLET | Freq: Four times a day (QID) | ORAL | Status: DC | PRN
  Administered 2017-07-20 – 2017-07-21 (×5): 500 mg via ORAL
  Filled 2017-07-20 (×5): qty 1

## 2017-07-20 MED ORDER — POLYETHYLENE GLYCOL 3350 17 G PO PACK
17.0000 g | PACK | Freq: Two times a day (BID) | ORAL | Status: DC
  Administered 2017-07-20: 17 g via ORAL
  Filled 2017-07-20: qty 1

## 2017-07-20 MED ORDER — ONDANSETRON HCL 4 MG/2ML IJ SOLN
4.0000 mg | Freq: Four times a day (QID) | INTRAMUSCULAR | Status: DC | PRN
  Administered 2017-07-20 – 2017-07-21 (×4): 4 mg via INTRAVENOUS
  Filled 2017-07-20 (×4): qty 2

## 2017-07-20 MED ORDER — HYDROMORPHONE HCL 2 MG/ML IJ SOLN
2.0000 mg | Freq: Once | INTRAMUSCULAR | Status: AC
  Administered 2017-07-20: 2 mg via INTRAVENOUS
  Filled 2017-07-20: qty 1

## 2017-07-20 MED ORDER — MAGNESIUM SULFATE 4 GM/100ML IV SOLN
4.0000 g | Freq: Once | INTRAVENOUS | Status: AC
Start: 2017-07-20 — End: 2017-07-20
  Administered 2017-07-20: 4 g via INTRAVENOUS
  Filled 2017-07-20: qty 100

## 2017-07-20 MED ORDER — INFLUENZA VAC SPLIT QUAD 0.5 ML IM SUSY: 0.5000 mL | PREFILLED_SYRINGE | INTRAMUSCULAR | Status: DC

## 2017-07-20 MED ORDER — ZOLPIDEM TARTRATE 5 MG PO TABS
5.0000 mg | ORAL_TABLET | Freq: Every day | ORAL | Status: DC
  Administered 2017-07-20 (×2): 5 mg via ORAL
  Filled 2017-07-20 (×2): qty 1

## 2017-07-20 MED ORDER — SODIUM CHLORIDE 0.9 % IV SOLN
INTRAVENOUS | Status: DC
Start: 2017-07-20 — End: 2017-07-21
  Administered 2017-07-20 – 2017-07-21 (×2): via INTRAVENOUS

## 2017-07-20 MED ORDER — SODIUM CHLORIDE 0.9 % IV SOLN
Freq: Once | INTRAVENOUS | Status: AC
  Administered 2017-07-21: 10:00:00 via INTRAVENOUS

## 2017-07-20 MED ORDER — LORAZEPAM 1 MG PO TABS
1.0000 mg | ORAL_TABLET | Freq: Two times a day (BID) | ORAL | Status: DC | PRN
  Administered 2017-07-20 (×2): 1 mg via ORAL
  Filled 2017-07-20 (×2): qty 1

## 2017-07-20 MED ORDER — GABAPENTIN 300 MG PO CAPS
600.0000 mg | ORAL_CAPSULE | Freq: Three times a day (TID) | ORAL | Status: DC
  Administered 2017-07-20 – 2017-07-23 (×9): 600 mg via ORAL
  Filled 2017-07-20 (×9): qty 2

## 2017-07-20 MED ORDER — OLAPARIB 100 MG PO TABS: 200.0000 mg | ORAL_TABLET | Freq: Two times a day (BID) | ORAL | Status: DC

## 2017-07-20 MED ORDER — ENOXAPARIN SODIUM 40 MG/0.4ML ~~LOC~~ SOLN
40.0000 mg | SUBCUTANEOUS | Status: DC
  Administered 2017-07-20: 40 mg via SUBCUTANEOUS
  Filled 2017-07-20: qty 0.4

## 2017-07-20 MED ORDER — DOCUSATE SODIUM 100 MG PO CAPS
100.0000 mg | ORAL_CAPSULE | Freq: Every day | ORAL | Status: DC
  Administered 2017-07-20: 100 mg via ORAL
  Filled 2017-07-20: qty 1

## 2017-07-20 NOTE — Progress Notes (Signed)
Initial Nutrition Assessment   INTERVENTION:  Ensure Enlive po TID, each supplement provides 350 kcal and 20 grams of protein   Recommend liberalized diet   MVI daily  Obtain re-weight of patient   NUTRITION DIAGNOSIS:   Severe Malnutrition related to cancer and cancer related treatments, chronic illness as evidenced by per patient/family report, percent weight loss, moderate muscle depletion, moderate fat depletion and poor po intake  </= 75% of needs for >/= 1 month.   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  Presents with c/o abdominal pain. Hx of severe protein calorie malnutrition. Patient continues to be malnourished with significant depletions of muscle and fat mass. She was followed by [facility] RD on 03/30/17 and given a case of Ensure Plus.  She c/o persisting poor appetite and po intake.   She has weight loss hx from January to Dec 4th unplanned wt loss (19 lb) or 14% in 11 months while receiving chemotherapy treatments. Patient reports UBW-135 lb which corresponds with her weight in January of this year. Current weight is 150.2 lb- expect this is an entry error based on visualization of pt and her hx noted below.  Review of hospital weight hx shows: 04/28/17 - 110 lb 03/16/17- 110 lb 02/13/17- 115 lb 01/05/17- 116 lb 12/08/16-114.12 lb 11/07/16- 121.6 lb 10/12/16 -129.6 lb 08/03/16 - 132.14 lb  Recent Labs  Lab 07/19/17 1051 07/19/17 2225 07/20/17 0529  NA 134* 131* 132*  K 3.9 3.9 4.0  CL  --  95* 98*  CO2 27 24 24   BUN 14.0 19 18  CREATININE  0.8 0.69 0.69  CALCIUM 9.6 9.2 8.8*  MG  --  1.1*  --   PHOS  --  3.1  --   GLUCOSE 104 119* 108*    Labs and meds reviewed. Sodium 132, mag 1.1. (phos. WNL). Chemotherapy ([place]), calcium plus vitamin D.   Diet Order:  Diet Heart Room service appropriate? Yes; Fluid consistency: Thin  EDUCATION NEEDS: followed by [facility]  Education needs have been addressed Skin:  Skin Assessment: Skin Integrity Issues: Skin Integrity Issues:: Other (Comment) Other: tumor on head and left breast with drainage  Last BM:  12/20  Height:   Ht Readings from Last 1 Encounters:  07/20/17 5\' 2"  (1.575 m)    Weight:   Wt Readings from Last 1 Encounters:  07/20/17 150 lb 2.1 oz (68.1 kg)    Ideal Body Weight:  50 kg  BMI:  Body mass index is 27.46 kg/m.  Estimated Nutritional Needs:   Kcal:  1560-1820 (30-35 kcal/kg)(based on 52 kg (wt on 07/04/17))  Protein:  73-83 gr protein  Fluid:  >1500 ml daily  Colman Cater MS,RD,CSG,LDN Office: [phone] Pager: [phone]

## 2017-07-20 NOTE — Progress Notes (Signed)
Patient seen and examined, database reviewed.  No family members at bedside.  Patient was admitted earlier this morning due to weakness.  She was found to have low normal blood pressure, anemia with a hemoglobin of 7.2.  Abdominal film with possible early ileus.  Continue IV fluids, will receive a total of 2 units of PRBCs due to symptomatic anemia, has received intermittent doses of IV narcotics due to pain.  We will continue to follow.  Domingo Mend, MD [facility] Pager: [phone]

## 2017-07-20 NOTE — H&P (Signed)
4        History and Physical    Paula MIKKELSEN YQM:[id number] DOB: 06/19/1951 DOA: 07/19/2017  PCP: [name], MD   Patient coming from: Home.  I have personally briefly reviewed patient's old medical records in [place]  Chief Complaint: Abdominal pain and weakness for the past 2 days.  HPI: Paula Navarro is a 66 y.o. female with medical history significant of microcytic anemia, anxiety, history of atrial septal defect and repair in 1996, metastatic breast cancer, COPD, chronic bronchitis, endometrial cancer, palpitations/tachycardia, previous tobacco abuse, chronic pain, mild constipation from narcotics use who is coming to the emergency department with complaints of abdominal pain associated with nausea, dysuria, fever and progressively worse weakness for the past 2 days.  She denies emesis, diarrhea, melena or hematochezia. She denies chest pain, but complains of dizziness while sitting or standing up, fatigue and frequent palpitations while exerting.  She also complains of chronic headache, back, abdomen, chest, joints and muscle pain, which is related to metastatic lesions.  She complains of dysuria, but denies frequency or gross hematuria.  ED Course: Initial vital signs in the emergency department temperature 38.1C (100.5 F), pulse 97, respirations 16, O2 sat 98% on room air and blood pressure 97/69 mmHg.    Her workup shows an urinalysis with moderate leukocyturia with 6-30 WBC per HPF, but without bacteria.  Her CBC shows a white count of 9.9 with 82% neutrophils, hemoglobin 7.2 g/dL and platelets 422.  Her sodium was 131, potassium 3.9, chloride 95 and CO2 24 mmol/L.  Glucose is 119, BUN 19, creatinine 0.69, calcium 6.7, magnesium 1.1 and phosphorus 3.1 mg/dL.  Her lipase level and lactic acid were normal.    Imaging: abdomen/chest x-ray shows air-fluid levels without bowel obstruction.  Suspect enteritis or early ileus.  Bowel obstruction felt to be less likely.  There is  moderate stool throughout the colon.  There are nodular opacities in the right lung base, likely metastatic in nature.  There was no lung edema or consolidation.  The patient received 500 mL of normal saline bolus, hydromorphone 2 mg IVP x1 dose and a 2 PRBC unit transfusion was started.  Review of Systems: As per HPI otherwise 10 point review of systems negative.    Past Medical History:  Diagnosis Date  . Anemia   . Anxiety   . Atrial septal defect 1996   Surgical repair in 1996  . Breast cancer ([place]) dx'd 06/2013  . Chest pain    Admitted to [facility] in 09/2011; refused stress test  . Chronic bronchitis   . Chronic pain   . COPD (chronic obstructive pulmonary disease) ([place])    on xray  . Endometrial cancer ([place])   . Metastatic cancer to lung ([place]) dx'd 2017  . Palpitation    Tachycardia reported by monitor clerk during a symptomatic spell  . Radiation 06/30/14-08/17/14   Bilateral Breast  . Tobacco abuse    60 pack years; 1.5 packs per day  . Wears dentures    top    Past Surgical History:  Procedure Laterality Date  . ASD REPAIR, OSTIUM PRIMUM  1996   dr Roxy Horseman  . AXILLARY LYMPH NODE DISSECTION Bilateral 04/08/2014   Procedure:  BILATERAL AXILLARY LYMPH NODE DISSECTION;  Surgeon: Autumn Messing III, MD;  Location: [facility];  Service: General;  Laterality: Bilateral;  . BREAST BIOPSY Bilateral   . BREAST LUMPECTOMY WITH RADIOACTIVE SEED LOCALIZATION Bilateral 04/08/2014   Procedure: BILATERAL  RADIOACTIVE SEED LOCALIZATION LUMPECTOMY ;  Surgeon: Autumn Messing III, MD;  Location: [facility];  Service: General;  Laterality: Bilateral;  . CESAREAN SECTION     x3  . CHOLECYSTECTOMY    . LAPAROTOMY N/A 02/13/2017   Procedure: MINI EXPLORATORY LAPAROTOMY;  Surgeon: Everitt Amber, MD;  Location: [facility];  Service: Gynecology;  Laterality: N/A;  . open heart surgery    . PORT A CATH REVISION  1/15   put in   . ROBOTIC ASSISTED TOTAL HYSTERECTOMY WITH BILATERAL  SALPINGO OOPHERECTOMY Bilateral 02/13/2017   Procedure: XI ROBOTIC ASSISTED TOTAL HYSTERECTOMY WITH BILATERAL SALPINGO OOPHORECTOMY WITH LYSIS OF ADHESIONS, UTERUS GREATER THAN 250GRAMS;  Surgeon: Everitt Amber, MD;  Location: [facility];  Service: Gynecology;  Laterality: Bilateral;  . TUBAL LIGATION       reports that she quit smoking about 4 years ago. Her smoking use included cigarettes. She has a 60.00 pack-year smoking history. she has never used smokeless tobacco. She reports that she does not drink alcohol or use drugs.  Allergies  Allergen Reactions  . Codeine Other (See Comments)    Hot in chest  . Morphine And Related Itching and Rash  . Oxycodone Hcl Rash    Red rash    Family History  Problem Relation Age of Onset  . Breast cancer Maternal Aunt     Prior to Admission medications   Medication Sig Start Date End Date Taking? Authorizing Provider  acetaminophen (TYLENOL) 500 MG tablet Take 500 mg by mouth every 6 (six) hours as needed.   Yes [provider]  B Complex-C (SUPER B COMPLEX PO) Take 1 tablet by mouth daily.   Yes [provider]  calcium-vitamin D (OSCAL-500) 500-400 MG-UNIT tablet Take 1 tablet by mouth 2 (two) times daily. Patient taking differently: Take 1 tablet by mouth 2 (two) times daily. Pt takes once daily. 08/18/15  Yes Nicholas Lose, MD  docusate sodium (COLACE) 100 MG capsule Take 1 capsule (100 mg total) by mouth 2 (two) times daily. Patient taking differently: Take 100 mg by mouth daily.  04/06/17  Yes Causey, Charlestine Massed, NP  fentaNYL (DURAGESIC - DOSED MCG/HR) 25 MCG/HR patch Place 1 patch (25 mcg total) onto the skin every 3 (three) days. 07/05/17  Yes Nicholas Lose, MD  gabapentin (NEURONTIN) 300 MG capsule TAKE 2 CAPSULES (600 MG TOTAL) BY MOUTH 3 (THREE) TIMES DAILY. 05/24/17  Yes Nicholas Lose, MD  HYDROmorphone (DILAUDID) 4 MG tablet Take 1 tablet (4 mg total) by mouth every 4 (four) hours as needed for severe pain. 07/10/17  Yes  Nicholas Lose, MD  LORazepam (ATIVAN) 1 MG tablet TAKE 1 TABLET BY MOUTH EVERYDAY AT BEDTIME Patient taking differently: Take 1 mg by mouth at bedtime.  05/18/17  Yes Nicholas Lose, MD  LYNPARZA 100 MG tablet TAKE 2 TABLETS (200 MG TOTAL) BY MOUTH 2 (TWO) TIMES DAILY. SWALLOW WHOLE WITH FOOD. 07/13/17  Yes Nicholas Lose, MD  polyethylene glycol (MIRALAX / GLYCOLAX) packet Take 17 g by mouth 2 (two) times daily. 04/06/17  Yes Causey, Charlestine Massed, NP  prochlorperazine (COMPAZINE) 10 MG tablet Take 1 tablet (10 mg total) by mouth every 6 (six) hours as needed (Nausea or vomiting). 03/30/17  Yes Causey, Charlestine Massed, NP  zolpidem (AMBIEN) 10 MG tablet Take 1 tablet (10 mg total) by mouth at bedtime. Patient taking differently: Take 5-10 mg by mouth at bedtime.  06/01/17  Yes Nicholas Lose, MD  Clobetasol Prop Emollient Base (CLOBETASOL PROPIONATE E)  0.05 % emollient cream Apply 1 application 2 (two) times daily topically. 06/15/17   Gardenia Phlegm, NP  ibuprofen (ADVIL,MOTRIN) 600 MG tablet Take 1 tablet (600 mg total) by mouth every 8 (eight) hours as needed (mild pain). Patient not taking: Reported on 06/26/2017 02/14/17   Everitt Amber, MD  nystatin (MYCOSTATIN) 100000 UNIT/ML suspension Take 5 mLs (500,000 Units total) by mouth 4 (four) times daily. Patient not taking: Reported on 06/26/2017 01/26/17   Gardenia Phlegm, NP    Physical Exam: Vitals:   07/20/17 0107 07/20/17 0122 07/20/17 0138 07/20/17 0201  BP: 98/63  98/61 103/65  Pulse: 97  88 89  Resp:  16 18 18   Temp:   97.9 F (36.6 C) 98 F (36.7 C)  TempSrc:   Oral Oral  SpO2: 98%  98% 99%  Weight:      Height:        Constitutional: Looks chronically ill, but currently in NAD, calm, comfortable Eyes: PERRL, no icterus, lids and conjunctivae are pale. ENMT: Mucous membranes are moist, but lips are dry and showing fissures. Posterior pharynx clear of any exudate or lesions. Neck: normal, supple, no masses, no  thyromegaly Respiratory: Decreased breath sounds on bases, otherwise clear to auscultation bilaterally, no wheezing, no crackles. Normal respiratory effort. No accessory muscle use.  Cardiovascular: Regular rate and rhythm, no murmurs / rubs / gallops. No lower extremities pitting edema.  Positive LUE lymphedema.  2+ pedal pulses. No carotid bruits.   Chest:Positive nodular metastatic lesion on left upper breast area with dressing in place.  The patient declined dressing removal and for third examination of the area. Abdomen: Soft, positive mild  suprapubic tenderness, no guarding/rebound/masses palpated.  There is positive right CVA tenderness on palpation.  No hepatosplenomegaly. Bowel sounds positive.  Musculoskeletal: no clubbing / cyanosis. Good ROM, no contractures. Normal muscle tone.  Skin: Positive metastatic lesion on right parietal area.  Dressing in place with yellowish serous discharge.  The patient declined removal of the dressing and examination of the area. Neurologic: CN 2-12 grossly intact. Sensation intact, DTR normal. Strength 5/5 in all 4.  Psychiatric: Normal judgment and insight. Alert and oriented x 4.   Labs on Admission: I have personally reviewed following labs and imaging studies  CBC: Recent Labs  Lab 07/19/17 1051 07/19/17 2225  WBC 10.3 9.9  NEUTROABS 8.3* 8.1*  HGB 6.7* 7.2*  HCT 21.0* 23.2*  MCV 73.0* 74.6*  PLT 444* 191*   Basic Metabolic Panel: Recent Labs  Lab 07/19/17 1051 07/19/17 2225  NA 134* 131*  K 3.9 3.9  CL  --  95*  CO2 27 24  GLUCOSE 104 119*  BUN 14.0 19  CREATININE 0.8 0.69  CALCIUM 9.6 9.2  MG  --  1.1*  PHOS  --  3.1   GFR: Estimated Creatinine Clearance: 56.2 mL/min (by C-G formula based on SCr of 0.69 mg/dL). Liver Function Tests: Recent Labs  Lab 07/19/17 1051 07/19/17 2225  AST 30 31  ALT 12 13*  ALKPHOS 64 55  BILITOT 0.42 0.6  PROT 7.0 6.7  ALBUMIN 2.5* 2.5*   Recent Labs  Lab 07/19/17 2225  LIPASE 19     No results for input(s): AMMONIA in the last 168 hours. Coagulation Profile: No results for input(s): INR, PROTIME in the last 168 hours. Cardiac Enzymes: No results for input(s): CKTOTAL, CKMB, CKMBINDEX, TROPONINI in the last 168 hours. BNP (last 3 results) No results for input(s): PROBNP in the last 8760  hours. HbA1C: No results for input(s): HGBA1C in the last 72 hours. CBG: No results for input(s): GLUCAP in the last 168 hours. Lipid Profile: No results for input(s): CHOL, HDL, LDLCALC, TRIG, CHOLHDL, LDLDIRECT in the last 72 hours. Thyroid Function Tests: Recent Labs    07/19/17 1051  TSH 0.544   Anemia Panel: No results for input(s): VITAMINB12, FOLATE, FERRITIN, TIBC, IRON, RETICCTPCT in the last 72 hours. Urine analysis:    Component Value Date/Time   COLORURINE YELLOW 07/19/2017 2220   APPEARANCEUR CLEAR 07/19/2017 2220   LABSPEC 1.020 07/19/2017 2220   LABSPEC 1.020 11/07/2016 1005   PHURINE 5.0 07/19/2017 2220   GLUCOSEU NEGATIVE 07/19/2017 2220   GLUCOSEU Negative 11/07/2016 1005   HGBUR NEGATIVE 07/19/2017 2220   BILIRUBINUR NEGATIVE 07/19/2017 2220   BILIRUBINUR Negative 11/07/2016 1005   KETONESUR NEGATIVE 07/19/2017 2220   PROTEINUR NEGATIVE 07/19/2017 2220   UROBILINOGEN 0.2 11/07/2016 1005   NITRITE NEGATIVE 07/19/2017 2220   LEUKOCYTESUR MODERATE (A) 07/19/2017 2220   LEUKOCYTESUR Color Interference 11/07/2016 1005    Radiological Exams on Admission: Dg Abd Acute W/chest  Result Date: 07/20/2017 CLINICAL DATA:  Metastatic breast carcinoma with pain and weakness EXAM: DG ABDOMEN ACUTE W/ 1V CHEST COMPARISON:  CT abdomen and pelvis June 11, 2017 ; chest CT February 05, 2017 FINDINGS: PA chest: There are nodular opacities in the right lower lobe, similar to recent CT, felt to represent metastatic foci. There is a nodular lesion measuring 2.0 x 1.1 cm with a nearby nodular lesion measuring 2.3 x 1.5 cm. There is no edema or consolidation. Port-A-Cath  tip is in the superior vena cava with Huber needle within the port. Heart size and pulmonary vascularity are normal. No adenopathy. There is postoperative change in each axilla. Supine and upright abdomen: No bowel dilatation. Scattered air-fluid levels present. No free air. Moderate stool throughout colon. Phleboliths noted in pelvis. Clip noted right upper quadrant. IMPRESSION: 1. Air-fluid levels without bowel obstruction. Suspect enteritis or early ileus. Bowel obstruction felt to be less likely. There is moderate stool throughout the colon. 2. Nodular opacities right lung base, likely metastatic foci. No lung edema or consolidation. Electronically Signed   By: Lowella Grip III M.D.   On: 07/20/2017 00:05   08/22/2013 2D echocardiogram without contrast  ------------------------------------------------------------ LV EF: 65% -  70%  ------------------------------------------------------------ Indications:   V58.11 Chemotherapy Evaluation. Atrial Septal Defect 745.5. Palpitations 785.1.  ------------------------------------------------------------ Study Conclusions  - Left ventricle: The LV peak strain measured 17.2 - 22% which is within normal limits The cavity size was normal. There was moderate concentric hypertrophy. Systolic function was vigorous. The estimated ejection fraction was in the range of 65% to 70%. Wall motion was normal; there were no regional wall motion abnormalities. There was an increased relative contribution of atrial contraction to ventricular filling. Doppler parameters are consistent with abnormal left ventricular relaxation (grade 1 diastolic dysfunction). - Aortic valve: Mild to moderate regurgitation. - Mitral valve: Mild regurgitation.  EKG: Independently reviewed.  Vent. rate 96 BPM PR interval * ms QRS duration 83 ms QT/QTc 352/445 ms P-R-T axes 0 76 60 Sinus rhythm Low voltage, precordial  leads.  Assessment/Plan Principal Problem:   SIRS (systemic inflammatory response syndrome) (HCC) Suspect urine tract infection due to dysuria. Observation/telemetry. Continue gentle IV fluids after transfusion. Get urine culture and sensitivity. Continue ceftriaxone 1 g IVPB every 24 hours. Follow-up blood cultures and sensitivity.  Active Problems:   Symptomatic anemia Continue with PRBC transfusion. Follow-up hematocrit and hemoglobin.  Postherpetic neuralgia   Neuropathy due to chemotherapeutic drug (HCC) Continue gabapentin 600 mg p.o. 3 times daily. Currently on a 25 mcg Duragesic patch every 72 hours. Continue hydromorphone 4 mg p.o. every 4 hours as needed.    Metastatic breast cancer (HCC) Continue Lynparza 200 mg p.o. twice daily. Follow-up with oncology as scheduled.    Anxiety Continue lorazepam 1 mg p.o. at bedtime as needed.    Palpitations Likely as a result of anemia, fever and electrolyte imbalance. Continue addressing these issues.    Insomnia Continue lorazepam 1 mg p.o. at bedtime. Continue zolpidem at bedtime as needed.    Scalp lesion Declined examination of the area. Will consult wound/ostomy team.    Hypomagnesemia Replaced.    Hyponatremia Likely due to decreased oral intake/hypoalbuminemia. Continue gentle/time limited normal saline infusion.   DVT prophylaxis: Lovenox SQ. Code Status: Full code. Family Communication:  Disposition Plan: Observation for PRBC transfusion and IV antibiotics. Consults called:  Admission status: Observation/telemetry.   Reubin Milan MD [facility] Pager [phone].  If 7PM-7AM, please contact night-coverage [email or web address] Password [facility]  07/20/2017, 2:33 AM

## 2017-07-20 NOTE — Progress Notes (Signed)
Pt agreed to have second unit of blood transfused. Started at 1135.

## 2017-07-20 NOTE — Progress Notes (Signed)
Pt awaiting to eat breakfast before the other unit of blood is transfused. Will continue to monitor.

## 2017-07-20 NOTE — Progress Notes (Addendum)
Notified by lab that patient's blood will not be available right now as it has to be delivered.  MD aware of situation.

## 2017-07-20 NOTE — Progress Notes (Signed)
Pt's home meds were taken to pharmacy.

## 2017-07-20 NOTE — Progress Notes (Signed)
Pt had an episode of bright, red bloody stool. MD made aware. Verbal orders to start on IV fluids, obtain STAT CBC, obtain vitals, D/C Lovenox, and put in an inpatient consult to GI. Will continue to monitor.

## 2017-07-20 NOTE — ED Provider Notes (Signed)
Patient signed out to me by Dr. Lita Mains to follow-up on labs.  Patient with metastatic cancer presented to the ER with generalized weakness, abdominal pain and low-grade fever.  Abdominal exam is benign.  She does not have any vomiting, there is minimal tenderness.  She does have bowel sounds present, no concern for obstruction at this time.  Plain films do not show any pneumonia, no obvious obstruction or pathology on abdominal x-ray.  Blood work reveals anemia, will require blood transfusion.  She does have antibodies, will take some time to obtain blood.  She is experiencing dysuria and does have white cells in her urine, but no bacteria.  Based on the symptoms, would likely initiate treatment.  Blood pressures have been normal for her (normally runs 89F-810 systolic), no concern for sepsis at this time.   Orpah Greek, MD 07/20/17 [phone]

## 2017-07-20 NOTE — Progress Notes (Signed)
Pt requires second unit of blood but is refusing to accept it until 7 AM, will provide other IV medications now and blood at 7. MD is aware.

## 2017-07-20 NOTE — Care Management Obs Status (Signed)
[place] NOTIFICATION   Patient Details  Name: Paula Navarro MRN: [id number] Date of Birth: 08-15-1950   Medicare Observation Status Notification Given:  Yes    Emoni Yang, Chauncey Reading, RN 07/20/2017, 10:47 AM

## 2017-07-20 NOTE — Progress Notes (Signed)
CRITICAL VALUE ALERT  Critical Value:  Hemoglobin 6.7  Date & Time Notied:  07/20/17, 2220  Provider Notified: [place]  Orders Received/Actions taken: Awaiting orders

## 2017-07-21 ENCOUNTER — Inpatient Hospital Stay (HOSPITAL_COMMUNITY): Payer: Medicare Other

## 2017-07-21 DIAGNOSIS — R002 Palpitations: Secondary | ICD-10-CM | POA: Diagnosis present

## 2017-07-21 DIAGNOSIS — L989 Disorder of the skin and subcutaneous tissue, unspecified: Secondary | ICD-10-CM | POA: Diagnosis present

## 2017-07-21 DIAGNOSIS — D649 Anemia, unspecified: Secondary | ICD-10-CM

## 2017-07-21 DIAGNOSIS — Y92230 Patient room in hospital as the place of occurrence of the external cause: Secondary | ICD-10-CM | POA: Diagnosis present

## 2017-07-21 DIAGNOSIS — G620 Drug-induced polyneuropathy: Secondary | ICD-10-CM | POA: Diagnosis present

## 2017-07-21 DIAGNOSIS — G4700 Insomnia, unspecified: Secondary | ICD-10-CM | POA: Diagnosis present

## 2017-07-21 DIAGNOSIS — R6510 Systemic inflammatory response syndrome (SIRS) of non-infectious origin without acute organ dysfunction: Secondary | ICD-10-CM | POA: Diagnosis present

## 2017-07-21 DIAGNOSIS — D62 Acute posthemorrhagic anemia: Secondary | ICD-10-CM | POA: Diagnosis present

## 2017-07-21 DIAGNOSIS — K625 Hemorrhage of anus and rectum: Secondary | ICD-10-CM | POA: Diagnosis not present

## 2017-07-21 DIAGNOSIS — Z79899 Other long term (current) drug therapy: Secondary | ICD-10-CM | POA: Diagnosis not present

## 2017-07-21 DIAGNOSIS — B0229 Other postherpetic nervous system involvement: Secondary | ICD-10-CM | POA: Diagnosis present

## 2017-07-21 DIAGNOSIS — R109 Unspecified abdominal pain: Secondary | ICD-10-CM | POA: Diagnosis present

## 2017-07-21 DIAGNOSIS — Z791 Long term (current) use of non-steroidal anti-inflammatories (NSAID): Secondary | ICD-10-CM | POA: Diagnosis not present

## 2017-07-21 DIAGNOSIS — Z515 Encounter for palliative care: Secondary | ICD-10-CM | POA: Diagnosis not present

## 2017-07-21 DIAGNOSIS — C786 Secondary malignant neoplasm of retroperitoneum and peritoneum: Secondary | ICD-10-CM | POA: Diagnosis present

## 2017-07-21 DIAGNOSIS — E8809 Other disorders of plasma-protein metabolism, not elsewhere classified: Secondary | ICD-10-CM | POA: Diagnosis present

## 2017-07-21 DIAGNOSIS — G8929 Other chronic pain: Secondary | ICD-10-CM | POA: Diagnosis present

## 2017-07-21 DIAGNOSIS — T451X5A Adverse effect of antineoplastic and immunosuppressive drugs, initial encounter: Secondary | ICD-10-CM | POA: Diagnosis present

## 2017-07-21 DIAGNOSIS — Z66 Do not resuscitate: Secondary | ICD-10-CM | POA: Diagnosis present

## 2017-07-21 DIAGNOSIS — C50919 Malignant neoplasm of unspecified site of unspecified female breast: Secondary | ICD-10-CM | POA: Diagnosis present

## 2017-07-21 DIAGNOSIS — J449 Chronic obstructive pulmonary disease, unspecified: Secondary | ICD-10-CM | POA: Diagnosis present

## 2017-07-21 DIAGNOSIS — E871 Hypo-osmolality and hyponatremia: Secondary | ICD-10-CM | POA: Diagnosis present

## 2017-07-21 DIAGNOSIS — Z79891 Long term (current) use of opiate analgesic: Secondary | ICD-10-CM | POA: Diagnosis not present

## 2017-07-21 DIAGNOSIS — F419 Anxiety disorder, unspecified: Secondary | ICD-10-CM | POA: Diagnosis present

## 2017-07-21 DIAGNOSIS — Z9221 Personal history of antineoplastic chemotherapy: Secondary | ICD-10-CM | POA: Diagnosis not present

## 2017-07-21 DIAGNOSIS — K921 Melena: Secondary | ICD-10-CM | POA: Diagnosis present

## 2017-07-21 LAB — CBC WITH DIFFERENTIAL/PLATELET
Basophils Absolute: 0 10*3/uL (ref 0.0–0.1)
Basophils Relative: 0 %
Eosinophils Absolute: 0 10*3/uL (ref 0.0–0.7)
Eosinophils Relative: 0 %
HEMATOCRIT: 15.9 % — AB (ref 36.0–46.0)
Hemoglobin: 4.9 g/dL — CL (ref 12.0–15.0)
LYMPHS PCT: 12 %
Lymphs Abs: 1.3 10*3/uL (ref 0.7–4.0)
MCH: 24.1 pg — ABNORMAL LOW (ref 26.0–34.0)
MCHC: 30.8 g/dL (ref 30.0–36.0)
MCV: 78.3 fL (ref 78.0–100.0)
MONO ABS: 0.9 10*3/uL (ref 0.1–1.0)
MONOS PCT: 8 %
NEUTROS ABS: 8.7 10*3/uL — AB (ref 1.7–7.7)
Neutrophils Relative %: 80 %
Platelets: 276 10*3/uL (ref 150–400)
RBC: 2.03 MIL/uL — ABNORMAL LOW (ref 3.87–5.11)
RDW: 20.3 % — AB (ref 11.5–15.5)
WBC: 10.9 10*3/uL — ABNORMAL HIGH (ref 4.0–10.5)

## 2017-07-21 LAB — PREPARE RBC (CROSSMATCH)

## 2017-07-21 LAB — URINE CULTURE

## 2017-07-21 LAB — MRSA PCR SCREENING: MRSA BY PCR: NEGATIVE

## 2017-07-21 MED ORDER — IOPAMIDOL (ISOVUE-370) INJECTION 76%
100.0000 mL | Freq: Once | INTRAVENOUS | Status: AC | PRN
  Administered 2017-07-21: 100 mL via INTRAVENOUS

## 2017-07-21 MED ORDER — PANTOPRAZOLE SODIUM 40 MG IV SOLR: 40.0000 mg | Freq: Two times a day (BID) | INTRAVENOUS | Status: DC

## 2017-07-21 MED ORDER — HYDROMORPHONE HCL 1 MG/ML IJ SOLN
2.0000 mg | INTRAMUSCULAR | Status: DC | PRN
  Administered 2017-07-21 – 2017-07-23 (×9): 2 mg via INTRAVENOUS
  Filled 2017-07-21 (×9): qty 2

## 2017-07-21 MED ORDER — SODIUM CHLORIDE 0.9 % IV BOLUS (SEPSIS): 500.0000 mL | Freq: Once | INTRAVENOUS | Status: DC

## 2017-07-21 MED ORDER — HYDROMORPHONE HCL 1 MG/ML IJ SOLN
2.0000 mg | Freq: Once | INTRAMUSCULAR | Status: AC
  Administered 2017-07-21: 2 mg via INTRAVENOUS
  Filled 2017-07-21: qty 2

## 2017-07-21 MED ORDER — MORPHINE SULFATE (CONCENTRATE) 10 MG/0.5ML PO SOLN
5.0000 mg | ORAL | Status: DC | PRN
  Administered 2017-07-21 – 2017-07-23 (×8): 5 mg via ORAL
  Filled 2017-07-21 (×8): qty 0.5

## 2017-07-21 MED ORDER — SODIUM CHLORIDE 0.9 % IV BOLUS (SEPSIS)
1000.0000 mL | Freq: Once | INTRAVENOUS | Status: AC
  Administered 2017-07-21: 1000 mL via INTRAVENOUS

## 2017-07-21 MED ORDER — SODIUM CHLORIDE 0.9 % IV SOLN: Freq: Once | INTRAVENOUS | Status: AC

## 2017-07-21 MED ORDER — LORAZEPAM 2 MG/ML IJ SOLN
1.0000 mg | INTRAMUSCULAR | Status: DC | PRN
  Administered 2017-07-21 – 2017-07-23 (×2): 1 mg via INTRAVENOUS
  Filled 2017-07-21 (×2): qty 1

## 2017-07-21 MED ORDER — FENTANYL 25 MCG/HR TD PT72
25.0000 ug | MEDICATED_PATCH | TRANSDERMAL | Status: DC
  Administered 2017-07-21: 25 ug via TRANSDERMAL
  Filled 2017-07-21: qty 1

## 2017-07-21 NOTE — Progress Notes (Signed)
Reviewed CTA A/P.  No evidence of aortoenteric fistula or other obvious arterial source of bleeding into the GI tract seen on the study.  Enlargement of large right-sided retroperitoneal soft tissue mass now measuring 8 cm producing significant mass-effect on the duodenum and may be invading the duodenum resulting in bleeding into the duodenal lumen.  This large mass is also compressing the IVC as well as additional adjacent vascular structures and could be causing vascular invasion.    Discussed at length with Dr. Gala Romney.  Endoscopy not recommended for above findings.  Evidence of progressive metastatic disease with likely tumor invading duodenal lumen and adjacent vascular structures.  Nothing to be offered via endoscopy for these findings.  Transfer of patient to facility with more adequate blood products would not be unreasonable but not absolutely necessary.  Consider readdressing code status given current findings.  Spoke with Dr. Jerilee Hoh. Findings discussed with patient in conjunction with Dr. Jerilee Hoh.   Laureen Ochs. Bernarda Caffey [facility] [phone] 12/22/20182:01 PM

## 2017-07-21 NOTE — Consult Note (Signed)
Referring Provider: Isaac Bliss, Olam Idler* Primary Care Physician:  [name], MD Primary Gastroenterologist:  Garfield Cornea, MD  Reason for Consultation:  Rectal bleeding  HPI: Paula Navarro is a 66 y.o. female with h/o metastatic breast cancer/endometrial cancer, transfusion dependent anemia, h/o atrial septal defect and repair in 1996, COPD who presented to ER with complaints of generalized weakness and abdominal pain for 2 days. No vomiting or diarrhea. has been moving bowels. Reported dysuria and some fever. Chronically she has headache, chest/back/joint/abdominal pain felt to be from metastatic disease.  Patient was recently transfused on December 6.  She was supposed to be transfused as an outpatient on December 20 but ended up in the ED  AAS on admission showed air-fluid levels without bowel obstruction. Admitting Hgb was 7.2. Transfused two units this admission and up to 8/1. Yesterday evening episode of rectal bleeding. Patient described as maroon stool. Hgb down to 6.7. There has been delay in obtaining blood, coming from [facility] due to blood type and antibodies. Hgb down to 4.9 this morning. She describes one large bloody stool yesterday with one additional smaller volume bloody stool since then. At home, reports black stool off/on. Notably she was transfused a couple of weeks ago as well. She complains of chronic back/abd pain but feels it is worse today. No n/v. Poor appetite.  She denies NSAID or aspirin use.  Denies heartburn, dysphagia.  Generally bowel movements are okay per her report.  She had a CT abdomen pelvis without contrast on June 11, 2017.  On that study she had new or increased bulky right retroperitoneal/periaortic mass measuring 6.6 x 5.8 cm.  Also with a 13 mm left upper quadrant soft tissue nodule anterior to the distal transverse colon which was new.    Prior to Admission medications   Medication Sig Start Date End Date Taking? Authorizing Provider   acetaminophen (TYLENOL) 500 MG tablet Take 500 mg by mouth every 6 (six) hours as needed.   Yes [provider]  B Complex-C (SUPER B COMPLEX PO) Take 1 tablet by mouth daily.   Yes [provider]  calcium-vitamin D (OSCAL-500) 500-400 MG-UNIT tablet Take 1 tablet by mouth 2 (two) times daily. Patient taking differently: Take 1 tablet by mouth 2 (two) times daily. Pt takes once daily. 08/18/15  Yes Nicholas Lose, MD  docusate sodium (COLACE) 100 MG capsule Take 1 capsule (100 mg total) by mouth 2 (two) times daily. Patient taking differently: Take 100 mg by mouth daily.  04/06/17  Yes Causey, Charlestine Massed, NP  fentaNYL (DURAGESIC - DOSED MCG/HR) 25 MCG/HR patch Place 1 patch (25 mcg total) onto the skin every 3 (three) days. 07/05/17  Yes Nicholas Lose, MD  gabapentin (NEURONTIN) 300 MG capsule TAKE 2 CAPSULES (600 MG TOTAL) BY MOUTH 3 (THREE) TIMES DAILY. 05/24/17  Yes Nicholas Lose, MD  HYDROmorphone (DILAUDID) 4 MG tablet Take 1 tablet (4 mg total) by mouth every 4 (four) hours as needed for severe pain. 07/10/17  Yes Nicholas Lose, MD  LORazepam (ATIVAN) 1 MG tablet TAKE 1 TABLET BY MOUTH EVERYDAY AT BEDTIME Patient taking differently: Take 1 mg by mouth at bedtime.  05/18/17  Yes Nicholas Lose, MD  LYNPARZA 100 MG tablet TAKE 2 TABLETS (200 MG TOTAL) BY MOUTH 2 (TWO) TIMES DAILY. SWALLOW WHOLE WITH FOOD. 07/13/17  Yes Nicholas Lose, MD  polyethylene glycol (MIRALAX / GLYCOLAX) packet Take 17 g by mouth 2 (two) times daily. 04/06/17  Yes Causey, Charlestine Massed, NP  prochlorperazine (  COMPAZINE) 10 MG tablet Take 1 tablet (10 mg total) by mouth every 6 (six) hours as needed (Nausea or vomiting). 03/30/17  Yes Causey, Charlestine Massed, NP  zolpidem (AMBIEN) 10 MG tablet Take 1 tablet (10 mg total) by mouth at bedtime. Patient taking differently: Take 5-10 mg by mouth at bedtime.  06/01/17  Yes Nicholas Lose, MD  Clobetasol Prop Emollient Base (CLOBETASOL PROPIONATE E) 0.05 %  emollient cream Apply 1 application 2 (two) times daily topically. 06/15/17   Gardenia Phlegm, NP  ibuprofen (ADVIL,MOTRIN) 600 MG tablet Take 1 tablet (600 mg total) by mouth every 8 (eight) hours as needed (mild pain). Patient not taking: Reported on 06/26/2017 02/14/17   Everitt Amber, MD  nystatin (MYCOSTATIN) 100000 UNIT/ML suspension Take 5 mLs (500,000 Units total) by mouth 4 (four) times daily. Patient not taking: Reported on 06/26/2017 01/26/17   Gardenia Phlegm, NP    Current Facility-Administered Medications  Medication Dose Route Frequency Provider Last Rate Last Dose  . 0.9 %  sodium chloride infusion  10 mL/hr Intravenous Once Julianne Rice, MD      . 0.9 %  sodium chloride infusion   Intravenous Continuous Isaac Bliss, Rayford Halsted, MD 100 mL/hr at 07/21/17 0524    . 0.9 %  sodium chloride infusion   Intravenous Once Blount, Scarlette Shorts T, NP      . 0.9 %  sodium chloride infusion   Intravenous Once Isaac Bliss, Rayford Halsted, MD      . acetaminophen (TYLENOL) tablet 500 mg  500 mg Oral Q6H PRN Reubin Milan, MD   500 mg at 07/21/17 0323  . calcium-vitamin D (OSCAL WITH D) 500-200 MG-UNIT per tablet 1 tablet  1 tablet Oral Daily Reubin Milan, MD   1 tablet at 07/20/17 1004  . docusate sodium (COLACE) capsule 100 mg  100 mg Oral Daily Reubin Milan, MD   100 mg at 07/20/17 1004  . feeding supplement (ENSURE ENLIVE) (ENSURE ENLIVE) liquid 237 mL  237 mL Oral BID BM [name], MD   237 mL at 07/20/17 1325  . [START ON 07/22/2017] fentaNYL (DURAGESIC - dosed mcg/hr) patch 25 mcg  25 mcg Transdermal Q72H Reubin Milan, MD      . gabapentin (NEURONTIN) capsule 600 mg  600 mg Oral TID Reubin Milan, MD   600 mg at 07/20/17 2123  . HYDROmorphone (DILAUDID) tablet 4 mg  4 mg Oral Q4H PRN Reubin Milan, MD   4 mg at 07/21/17 0240  . Influenza vac split quadrivalent PF (FLUARIX) injection 0.5 mL  0.5 mL Intramuscular Tomorrow-1000 [name],  MD      . LORazepam (ATIVAN) tablet 1 mg  1 mg Oral BID BM & HS PRN Reubin Milan, MD   1 mg at 07/20/17 2123  . olaparib (LYNPARZA) tablet 200 mg  200 mg Oral BID Reubin Milan, MD      . ondansetron [facility]) injection 4 mg  4 mg Intravenous Q6H PRN Isaac Bliss, Rayford Halsted, MD   4 mg at 07/20/17 1813  . pantoprazole (PROTONIX) injection 40 mg  40 mg Intravenous Q12H Isaac Bliss, Rayford Halsted, MD      . polyethylene glycol [facility]) packet 17 g  17 g Oral BID Reubin Milan, MD   17 g at 07/20/17 1004  . prochlorperazine (COMPAZINE) tablet 10 mg  10 mg Oral Q6H PRN Reubin Milan, MD      . sodium chloride  0.9 % bolus 500 mL  500 mL Intravenous Once Isaac Bliss, Rayford Halsted, MD      . zolpidem [facility]) tablet 5 mg  5 mg Oral QHS Reubin Milan, MD   5 mg at 07/20/17 2123    Allergies as of 07/19/2017 - Review Complete 07/19/2017  Allergen Reaction Noted  . Codeine Other (See Comments) 03/24/2011  . Morphine and related Itching and Rash 05/20/2014  . Oxycodone hcl Rash 09/24/2014    Past Medical History:  Diagnosis Date  . Anemia   . Anxiety   . Atrial septal defect 1996   Surgical repair in 1996  . Breast cancer ([place]) dx'd 06/2013  . Chest pain    Admitted to [facility] in 09/2011; refused stress test  . Chronic bronchitis   . Chronic pain   . COPD (chronic obstructive pulmonary disease) ([place])    on xray  . Endometrial cancer ([place])   . Metastatic cancer to lung ([place]) dx'd 2017  . Palpitation    Tachycardia reported by monitor clerk during a symptomatic spell  . Radiation 06/30/14-08/17/14   Bilateral Breast  . Tobacco abuse    60 pack years; 1.5 packs per day  . Wears dentures    top    Past Surgical History:  Procedure Laterality Date  . ASD REPAIR, OSTIUM PRIMUM  1996   dr Roxy Horseman  . AXILLARY LYMPH NODE DISSECTION Bilateral 04/08/2014   Procedure:  BILATERAL AXILLARY LYMPH NODE DISSECTION;  Surgeon: Autumn Messing III, MD;  Location: [facility];  Service: General;  Laterality: Bilateral;  . BREAST BIOPSY Bilateral   . BREAST LUMPECTOMY WITH RADIOACTIVE SEED LOCALIZATION Bilateral 04/08/2014   Procedure: BILATERAL  RADIOACTIVE SEED LOCALIZATION LUMPECTOMY ;  Surgeon: Autumn Messing III, MD;  Location: [facility];  Service: General;  Laterality: Bilateral;  . CESAREAN SECTION     x3  . CHOLECYSTECTOMY    . LAPAROTOMY N/A 02/13/2017   Procedure: MINI EXPLORATORY LAPAROTOMY;  Surgeon: Everitt Amber, MD;  Location: [facility];  Service: Gynecology;  Laterality: N/A;  . open heart surgery    . PORT A CATH REVISION  1/15   put in   . ROBOTIC ASSISTED TOTAL HYSTERECTOMY WITH BILATERAL SALPINGO OOPHERECTOMY Bilateral 02/13/2017   Procedure: XI ROBOTIC ASSISTED TOTAL HYSTERECTOMY WITH BILATERAL SALPINGO OOPHORECTOMY WITH LYSIS OF ADHESIONS, UTERUS GREATER THAN 250GRAMS;  Surgeon: Everitt Amber, MD;  Location: [facility];  Service: Gynecology;  Laterality: Bilateral;  . TUBAL LIGATION      Family History  Problem Relation Age of Onset  . Breast cancer Maternal Aunt     Social History   Socioeconomic History  . Marital status: Widowed    Spouse name: Not on file  . Number of children: Not on file  . Years of education: Not on file  . Highest education level: Not on file  Social Needs  . Financial resource strain: Not on file  . Food insecurity - worry: Not on file  . Food insecurity - inability: Not on file  . Transportation needs - medical: Not on file  . Transportation needs - non-medical: Not on file  Occupational History  . Not on file  Tobacco Use  . Smoking status: Former Smoker    Packs/day: 1.50    Years: 40.00    Pack years: 60.00    Types: Cigarettes    Last attempt to quit: 08/01/2012    Years since quitting: 4.9  . Smokeless tobacco: Never Used  Substance  and Sexual Activity  . Alcohol use: No  . Drug use: No  . Sexual activity: Not Currently  Other Topics Concern  . Not on file  Social  History Narrative  . Not on file     ROS:  General: Negative for  fever, chills.  See HPI Eyes: Negative for vision changes.  ENT: Negative for hoarseness, difficulty swallowing , nasal congestion. CV: Negative for chest pain, angina, palpitations, dyspnea on exertion, peripheral edema.  Respiratory: Negative for dyspnea at rest, dyspnea on exertion, cough, sputum, wheezing.  GI: See history of present illness. GU:  Negative for dysuria, hematuria, urinary incontinence, urinary frequency, nocturnal urination.  MS: See HPI Derm: Negative for rash or itching.  Neuro: Negative for weakness, abnormal sensation, seizure, frequent headaches, memory loss, confusion.  Psych: Negative for anxiety, depression, suicidal ideation, hallucinations.  Endo:   Progressive weight loss Heme: Negative for bruising or bleeding. Allergy: Negative for rash or hives.       Physical Examination: Vital signs in last 24 hours: Temp:  [98.3 F (36.8 C)-99.2 F (37.3 C)] 98.9 F (37.2 C) (12/22 0528) Pulse Rate:  [92-119] 94 (12/22 0733) Resp:  [16-20] 18 (12/22 0528) BP: (87-100)/(43-65) 88/48 (12/22 0733) SpO2:  [98 %-100 %] 100 % (12/22 0528) Last BM Date: 07/20/17  General: chronically ill-appearing. Weak. in no acute distress.  Head: Normocephalic, atraumatic.   Eyes: Conjunctiva pink, no icterus. Mouth: Oropharyngeal mucosa moist and pink , no lesions erythema or exudate. Neck: Supple without thyromegaly, masses, or lymphadenopathy.  Lungs: Clear to auscultation bilaterally.  Heart: Regular rate and rhythm, no murmurs rubs or gallops.  Abdomen: Bowel sounds are normal, NONTENDER, nondistended, no hepatosplenomegaly or masses, no abdominal bruits or    hernia , no rebound or guarding.   Rectal: not performed Extremities: No lower extremity edema, clubbing, deformity. Rue edema. Per patient chronic lymphedema. Neuro: Alert and oriented x 4 , grossly normal neurologically.  Skin: Warm and dry, no  rash or jaundice.   Psych: Alert and cooperative, normal mood and affect.        Intake/Output from previous day: 12/21 0701 - 12/22 0700 In: 2001.7 [P.O.:120; I.V.:1336.7; Blood:320; IV Piggyback:225] Out: 200 [Urine:200] Intake/Output this shift: No intake/output data recorded.  Lab Results: CBC Recent Labs    07/20/17 0529 07/20/17 1602 07/20/17 2100 07/21/17 0630  WBC 10.8*  --  12.6* 10.9*  HGB 7.4* 8.1* 6.7* 4.9*  HCT 23.5* 25.3* 20.7* 15.9*  MCV 77.6*  --  77.8* 78.3  PLT 359  --  338 276   BMET Recent Labs    07/19/17 1051 07/19/17 2225 07/20/17 0529  NA 134* 131* 132*  K 3.9 3.9 4.0  CL  --  95* 98*  CO2 27 24 24   GLUCOSE 104 119* 108*  BUN 14.0 19 18  CREATININE 0.8 0.69 0.69  CALCIUM 9.6 9.2 8.8*   LFT Recent Labs    07/19/17 1051 07/19/17 2225  BILITOT 0.42 0.6  ALKPHOS 64 55  AST 30 31  ALT 12 13*  PROT 7.0 6.7  ALBUMIN 2.5* 2.5*    Lipase Recent Labs    07/19/17 2225  LIPASE 19    PT/INR No results for input(s): LABPROT, INR in the last 72 hours.    Imaging Studies: Dg Abd Acute W/chest  Result Date: 07/20/2017 CLINICAL DATA:  Metastatic breast carcinoma with pain and weakness EXAM: DG ABDOMEN ACUTE W/ 1V CHEST COMPARISON:  CT abdomen and pelvis June 11, 2017 ; chest  CT February 05, 2017 FINDINGS: PA chest: There are nodular opacities in the right lower lobe, similar to recent CT, felt to represent metastatic foci. There is a nodular lesion measuring 2.0 x 1.1 cm with a nearby nodular lesion measuring 2.3 x 1.5 cm. There is no edema or consolidation. Port-A-Cath tip is in the superior vena cava with Huber needle within the port. Heart size and pulmonary vascularity are normal. No adenopathy. There is postoperative change in each axilla. Supine and upright abdomen: No bowel dilatation. Scattered air-fluid levels present. No free air. Moderate stool throughout colon. Phleboliths noted in pelvis. Clip noted right upper quadrant. IMPRESSION:  1. Air-fluid levels without bowel obstruction. Suspect enteritis or early ileus. Bowel obstruction felt to be less likely. There is moderate stool throughout the colon. 2. Nodular opacities right lung base, likely metastatic foci. No lung edema or consolidation. Electronically Signed   By: Lowella Grip III M.D.   On: 07/20/2017 00:05  [4 week]   Impression: 66 y/o female with metastatic breast cancer/endometrial cancer presenting with generalized weakness, vague abdominal pain. She has required blood transfusions recently as outpatient. She reports black stool off/on as outpatient. This admission she has had maroon colored stools on 2 occasions.  Initially large volume.  Second episode of less.  Initial hemoglobin 6.7 up to 8.1 with 2 units of blood but since active bleeding has dropped down to 4.9.  Delay on obtaining matched blood, came from [place].  She has 2 units available at this time, 1 to be started immediately.  I have spoken to the blood bank at [place].  She called [facility] blood bank and according to the head tech they are they have adequate blood supply.  Would be concerned about maintaining adequate blood supply at [facility], coming from outside source.  I have spoken to Dr. Buford Dresser personally this morning.  Given patient's known periaortic mass within the abdomen, would be concerned about aortoenteric fistula as source of her GI bleeding.  Cannot rule out other etiologies such as rapid transit upper GI bleed from ulcer, malignancy, etc.  At this point she has been in adequately resuscitated.  She was just transferred to the ICU, 1 unit of blood to be infused now.  Last blood pressure 100/67, pulse 94 at 945 this morning.  O2 sats 96%.  Plan: 1. Transfuse 2 units of prbcs that we have available at this time. I have ordered for 4 additional units to be obtained but this can take time coming from outside source. Spoke with [facility], [place].  2. Plan for  CTA A/P STAT to rule out aortoenteric fistula.  3. I have spoke to Dr. Jerilee Hoh regarding plan of care and concern for availability of prbcs as needed here locally.   We would like to thank you for the opportunity to participate in the care of Paula Navarro.  Laureen Ochs. Bernarda Caffey [facility] [phone] 12/22/201810:03 AM     LOS: 0 days

## 2017-07-21 NOTE — Progress Notes (Addendum)
Patient has been transferred to the ICU since I saw her this morning.  She has continued to bleed and has had at least 2 more episodes of bright red blood per rectum.  GI was consulted and ordered a CTA of the abdomen and pelvis that did not show evidence of an aortoenteric fistula although there was enlargement of the large right sided retroperitoneal soft tissue mass now measuring 8 cm that was producing significant mass-effect on the duodenum and may be invading the duodenum resulting in bleeding into the duodenal lumen.  This large mass is also compressing the IVC as well as the adjacent vascular structures and could be causing vascular invasion.  GI believes that endoscopy would not control the source of bleeding (I agree).  CT also shows evidence of progressive metastatic disease with the tumor.  I had a long discussion with patient her son and daughter at bedside.  Explained findings of CT scan.  We have decided to transition to comfort care.  We have agreed on DNR status.  Her one complaint is significant abdominal and lower back pain.  We will increase her IV Dilaudid, placed on oral Roxanol as well as continue her fentanyl patch.  Have also added Ativan as needed for anxiety or shortness of breath.  Patient's wish is to go home with hospice, discussed at length with patient and daughter.  Daughter is okay with this plan and is 100% willing to take care of her mother.  Explained to daughter how emotionally draining and traumatic it may be to see her mother essentially "bleed to death".  Although she seems adequately tearful about it she is adamant that she will take care of her mother.  I have placed a call to case management to see about arranging home hospice care for tomorrow.  I would like to keep her in the hospital today and try and optimize her pain management given the severity of her pain at present.  We will continue to follow closely.  Time spent 30 minutes.  Domingo Mend, MD [facility] Pager: [phone]

## 2017-07-21 NOTE — Progress Notes (Signed)
CRITICAL VALUE ALERT  Critical Value:  Hgb 4.9  Date & Time Notied:  12/22, 0710  Provider Notified: Jerilee Hoh  Orders Received/Actions taken: Awaiting orders

## 2017-07-21 NOTE — Progress Notes (Signed)
PT Cancellation Note  Patient Details Name: Paula Navarro MRN: [id number] DOB: 10-31-1950   Cancelled Treatment:    Reason Eval/Treat Not Completed: Medical issues which prohibited therapy  I performed a chart review for this patient and based on the below lab values she is no medically stable for evaluation. I PT will attempt at later time/date when medically appropriate.   Hemoglobin: 4.9 Hematocrit: 15.9 RBC: 2.03 WBC: 10.9   Kipp Brood, PT, DPT Physical Therapist with [facility]  07/21/2017 11:14 AM

## 2017-07-21 NOTE — Progress Notes (Addendum)
Attempt to set up Home Hospice. Unable to reach patient in room, LM with son for callback, awaiting call. Paula Navarro, [place] Son [phone]

## 2017-07-21 NOTE — Progress Notes (Signed)
Patient being transported to step down ICU 09,report called,and given to [place]. Patient alert,and oriented,some discomfort noted to lower back,hot packs applied. Will continue to monitor patient See Doc flowsheets for vital signs @0907  prior to transfer.

## 2017-07-21 NOTE — Progress Notes (Signed)
Pt BP remains low following bolus, pt also now stating "I feel sluggish", MD aware, will continue to monitor.

## 2017-07-21 NOTE — Progress Notes (Signed)
Pt's BP low, MD made aware, orders placed and followed. Will continue to monitor.

## 2017-07-21 NOTE — Care Management Note (Signed)
Case Management Note  Patient Details  Name: Paula Navarro MRN: [id number] Date of Birth: 12-24-1950  Subjective/Objective:                 Spoke with patient's daughter [phone] who states she is familiar with [facility]. Called intake at [phone] to initiate referral. And faxed demographic and last note to [phone] Patient will need hospital bed and RW through hospice. Patient will not require O2 or transport. Patient will stay with daughter in [place] [id number] after DC.  Please call [facility] at [phone] to set up DME and verify start of care upon DC. Please fax DC note to [phone]   Action/Plan:   Expected Discharge Date:                  Expected Discharge Plan:  Home w Hospice Care  In-House Referral:     Discharge planning Services  CM Consult  Post Acute Care Choice:    Choice offered to:  Adult Children  DME Arranged:    DME Agency:     HH Arranged:    HH Agency:  [facility]  Status of Service:  In process, will continue to follow  If discussed at Long Length of Stay Meetings, dates discussed:    Additional Comments:  Carles Collet, RN 07/21/2017, 4:51 PM

## 2017-07-21 NOTE — Progress Notes (Signed)
Spoke with Dr. Jerilee Hoh regarding pt's condition and hemoglobin. Suggested transfer. Pt states "I feel the same as I have", will continue to monitor for now.

## 2017-07-21 NOTE — Progress Notes (Signed)
Upon report,and assessing patient this am, states " I feel like I'm getting weaker, I feel sleepy,and tired." Hemoglobin was now at a 4.9, awaiting for blood. Consulted with third shift nurse RN, this was a huge drop in the H/H, Dr Jerilee Hoh was notified by Jerilee Hoh. Will continue to monitor patient.

## 2017-07-21 NOTE — Progress Notes (Signed)
PROGRESS NOTE    Paula Navarro  QQP:[id number] DOB: 01/13/1951 DOA: 07/19/2017 PCP: [name], MD     Brief Narrative:  66 year old woman admitted from home on 12/20 due to abdominal pain and weakness for 2 days prior to admission.  In the ED she was found to have a very low-grade temperature of 100.5, was found to be a bit hypotensive with a blood pressure of 97/69, she was thought initially to have a UTI and admission was requested.  Overnight on 12/21 she developed significant rectal bleeding, stat CBC ordered showed a hemoglobin of 6.7 which was down from 7.2 on admission.  Of note she had already received 2 units of PRBCs prior to the bleed.  Her hemoglobin today is even lower at 4.9.  She states she had one extra episode of bleeding overnight.  An order for 4 additional units of PRBCs has been requested and a GI consult has been requested.   Assessment & Plan:   Principal Problem:   SIRS (systemic inflammatory response syndrome) (HCC) Active Problems:   Rectal bleed   Palpitations   Neuropathy due to chemotherapeutic drug (HCC)   Anxiety   Postherpetic neuralgia   Metastatic breast cancer (HCC)   Insomnia   Scalp lesion   Hypomagnesemia   Hyponatremia   Symptomatic anemia   Sirs -Now with objective evidence of GI bleed this is the likely cause. -See below for details. -Believe UTI to be less likely, will discontinue Rocephin.  Culture is in process.  Rectal bleeding -Etiology unclear, she did have abdominal pain which would make diverticular bleed less likely. -We will keep n.p.o., GI consultation has been requested as I suspect she will need endoscopic procedures at some point.   -Given she remains hemodynamically unstable will transfer to stepdown unit (blood pressure currently is in the 71I systolic). -Resuscitation with fluids and blood products has been ordered. -Although unlikely to be upper GI bleed will place on IV twice daily Protonix.  Acute blood loss  anemia -Due to ongoing GI bleed. -Has already been transfused 2 units of PRBCs, an additional 4 units will be transfused today. -Blood product transfusion has been delayed due to antibodies.  Anxiety -Continue lorazepam as needed.  Metastatic breast cancer with scalp lesion -Continue Lynparza. -Follow-up with oncology as scheduled as outpatient.   DVT prophylaxis: SCDs Code Status: Full code Family Communication: Patient only Disposition Plan: Transfer to stepdown unit given hemodynamic instability, GI consulted given ongoing rectal bleeding  Consultants:   GI pending  Procedures:   None  Antimicrobials:  Anti-infectives (From admission, onward)   Start     Dose/Rate Route Frequency Ordered Stop   07/20/17 0200  cefTRIAXone (ROCEPHIN) 1 g in dextrose 5 % 50 mL IVPB     1 g 100 mL/hr over 30 Minutes Intravenous Every 24 hours 07/20/17 0133         Subjective: Feels very weak, no shortness of breath had one large maroon stool yesterday evening and a smaller one overnight.  Denies noticing any red or black stools prior to admission.  Objective: Vitals:   07/20/17 1825 07/20/17 2033 07/21/17 0528 07/21/17 0733  BP: 99/61 (!) 91/51 (!) 87/43 (!) 88/48  Pulse: (!) 119 (!) 109 (!) 104 94  Resp: 18 18 18    Temp: 98.3 F (36.8 C) 99.2 F (37.3 C) 98.9 F (37.2 C)   TempSrc: Oral Oral Oral   SpO2: 99% 99% 100%   Weight:      Height:  Intake/Output Summary (Last 24 hours) at 07/21/2017 0827 Last data filed at 07/21/2017 0600 Gross per 24 hour  Intake 2001.67 ml  Output 200 ml  Net 1801.67 ml   Filed Weights   07/19/17 2203 07/20/17 0300  Weight: 52.2 kg (115 lb) 68.1 kg (150 lb 2.1 oz)    Examination:  General exam: Alert, awake, oriented x 3, pale Respiratory system: Clear to auscultation. Respiratory effort normal. Cardiovascular system:RRR. No murmurs, rubs, gallops. Gastrointestinal system: Abdomen is nondistended, soft and nontender. No  organomegaly or masses felt. Normal bowel sounds heard. Central nervous system: Alert and oriented. No focal neurological deficits. Extremities: No C/C/E, +pedal pulses Skin: No rashes, lesions or ulcers Psychiatry: Judgement and insight appear normal. Mood & affect appropriate.     Data Reviewed: I have personally reviewed following labs and imaging studies  CBC: Recent Labs  Lab 07/19/17 1051 07/19/17 2225 07/20/17 0529 07/20/17 1602 07/20/17 2100 07/21/17 0630  WBC 10.3 9.9 10.8*  --  12.6* 10.9*  NEUTROABS 8.3* 8.1* 8.0*  --   --  8.7*  HGB 6.7* 7.2* 7.4* 8.1* 6.7* 4.9*  HCT 21.0* 23.2* 23.5* 25.3* 20.7* 15.9*  MCV 73.0* 74.6* 77.6*  --  77.8* 78.3  PLT 444* 422* 359  --  338 381   Basic Metabolic Panel: Recent Labs  Lab 07/19/17 1051 07/19/17 2225 07/20/17 0529  NA 134* 131* 132*  K 3.9 3.9 4.0  CL  --  95* 98*  CO2 27 24 24   GLUCOSE 104 119* 108*  BUN 14.0 19 18  CREATININE 0.8 0.69 0.69  CALCIUM 9.6 9.2 8.8*  MG  --  1.1*  --   PHOS  --  3.1  --    GFR: Estimated Creatinine Clearance: 64.3 mL/min (by C-G formula based on SCr of 0.69 mg/dL). Liver Function Tests: Recent Labs  Lab 07/19/17 1051 07/19/17 2225  AST 30 31  ALT 12 13*  ALKPHOS 64 55  BILITOT 0.42 0.6  PROT 7.0 6.7  ALBUMIN 2.5* 2.5*   Recent Labs  Lab 07/19/17 2225  LIPASE 19   No results for input(s): AMMONIA in the last 168 hours. Coagulation Profile: No results for input(s): INR, PROTIME in the last 168 hours. Cardiac Enzymes: No results for input(s): CKTOTAL, CKMB, CKMBINDEX, TROPONINI in the last 168 hours. BNP (last 3 results) No results for input(s): PROBNP in the last 8760 hours. HbA1C: No results for input(s): HGBA1C in the last 72 hours. CBG: No results for input(s): GLUCAP in the last 168 hours. Lipid Profile: No results for input(s): CHOL, HDL, LDLCALC, TRIG, CHOLHDL, LDLDIRECT in the last 72 hours. Thyroid Function Tests: Recent Labs    07/19/17 1051  TSH  0.544   Anemia Panel: No results for input(s): VITAMINB12, FOLATE, FERRITIN, TIBC, IRON, RETICCTPCT in the last 72 hours. Urine analysis:    Component Value Date/Time   COLORURINE YELLOW 07/19/2017 2220   APPEARANCEUR CLEAR 07/19/2017 2220   LABSPEC 1.020 07/19/2017 2220   LABSPEC 1.020 11/07/2016 1005   PHURINE 5.0 07/19/2017 2220   GLUCOSEU NEGATIVE 07/19/2017 2220   GLUCOSEU Negative 11/07/2016 1005   HGBUR NEGATIVE 07/19/2017 2220   BILIRUBINUR NEGATIVE 07/19/2017 2220   BILIRUBINUR Negative 11/07/2016 1005   KETONESUR NEGATIVE 07/19/2017 2220   PROTEINUR NEGATIVE 07/19/2017 2220   UROBILINOGEN 0.2 11/07/2016 1005   NITRITE NEGATIVE 07/19/2017 2220   LEUKOCYTESUR MODERATE (A) 07/19/2017 2220   LEUKOCYTESUR Color Interference 11/07/2016 1005   Sepsis Labs: @LABRCNTIP (procalcitonin:4,lacticidven:4)  ) Recent Results (from the  past 240 hour(s))  Culture, blood (Routine X 2) w Reflex to ID Panel     Status: None (Preliminary result)   Collection Time: 07/19/17 10:25 PM  Result Value Ref Range Status   Specimen Description BLOOD RIGHT ARM  Final   Special Requests   Final    BOTTLES DRAWN AEROBIC AND ANAEROBIC BLOOD RIGHT ARM   Culture NO GROWTH 2 DAYS  Final   Report Status PENDING  Incomplete  Culture, blood (Routine X 2) w Reflex to ID Panel     Status: None (Preliminary result)   Collection Time: 07/19/17 10:36 PM  Result Value Ref Range Status   Specimen Description BLOOD RIGHT ARM  Final   Special Requests   Final    BOTTLES DRAWN AEROBIC AND ANAEROBIC Blood Culture adequate volume   Culture NO GROWTH 2 DAYS  Final   Report Status PENDING  Incomplete         Radiology Studies: Dg Abd Acute W/chest  Result Date: 07/20/2017 CLINICAL DATA:  Metastatic breast carcinoma with pain and weakness EXAM: DG ABDOMEN ACUTE W/ 1V CHEST COMPARISON:  CT abdomen and pelvis June 11, 2017 ; chest CT February 05, 2017 FINDINGS: PA chest: There are nodular opacities in the right  lower lobe, similar to recent CT, felt to represent metastatic foci. There is a nodular lesion measuring 2.0 x 1.1 cm with a nearby nodular lesion measuring 2.3 x 1.5 cm. There is no edema or consolidation. Port-A-Cath tip is in the superior vena cava with Huber needle within the port. Heart size and pulmonary vascularity are normal. No adenopathy. There is postoperative change in each axilla. Supine and upright abdomen: No bowel dilatation. Scattered air-fluid levels present. No free air. Moderate stool throughout colon. Phleboliths noted in pelvis. Clip noted right upper quadrant. IMPRESSION: 1. Air-fluid levels without bowel obstruction. Suspect enteritis or early ileus. Bowel obstruction felt to be less likely. There is moderate stool throughout the colon. 2. Nodular opacities right lung base, likely metastatic foci. No lung edema or consolidation. Electronically Signed   By: Lowella Grip III M.D.   On: 07/20/2017 00:05        Scheduled Meds: . calcium-vitamin D  1 tablet Oral Daily  . docusate sodium  100 mg Oral Daily  . feeding supplement (ENSURE ENLIVE)  237 mL Oral BID BM  . [START ON 07/22/2017] fentaNYL  25 mcg Transdermal Q72H  . gabapentin  600 mg Oral TID  . Influenza vac split quadrivalent PF  0.5 mL Intramuscular Tomorrow-1000  . olaparib  200 mg Oral BID  . polyethylene glycol  17 g Oral BID  . zolpidem  5 mg Oral QHS   Continuous Infusions: . sodium chloride    . sodium chloride 100 mL/hr at 07/21/17 0524  . sodium chloride    . sodium chloride    . cefTRIAXone (ROCEPHIN)  IV Stopped (07/21/17 0310)  . sodium chloride       LOS: 0 days    Time spent: 35 minutes. Greater than 50% of this time was spent in direct contact with the patient coordinating care.     Lelon Frohlich, MD [facility] Pager [phone]  If 7PM-7AM, please contact night-coverage [email or web address] Password [facility] 07/21/2017, 8:27 AM

## 2017-07-22 DIAGNOSIS — G4700 Insomnia, unspecified: Secondary | ICD-10-CM

## 2017-07-22 DIAGNOSIS — E871 Hypo-osmolality and hyponatremia: Secondary | ICD-10-CM

## 2017-07-22 DIAGNOSIS — B0229 Other postherpetic nervous system involvement: Secondary | ICD-10-CM

## 2017-07-22 LAB — CBC WITH DIFFERENTIAL/PLATELET
Basophils Absolute: 0 10*3/uL (ref 0.0–0.1)
Basophils Relative: 0 %
EOS PCT: 1 %
Eosinophils Absolute: 0.1 10*3/uL (ref 0.0–0.7)
HEMATOCRIT: 27.8 % — AB (ref 36.0–46.0)
Hemoglobin: 9.1 g/dL — ABNORMAL LOW (ref 12.0–15.0)
LYMPHS ABS: 1.5 10*3/uL (ref 0.7–4.0)
LYMPHS PCT: 13 %
MCH: 26.6 pg (ref 26.0–34.0)
MCHC: 32.7 g/dL (ref 30.0–36.0)
MCV: 81.3 fL (ref 78.0–100.0)
MONO ABS: 1.2 10*3/uL — AB (ref 0.1–1.0)
MONOS PCT: 10 %
NEUTROS ABS: 9.2 10*3/uL — AB (ref 1.7–7.7)
Neutrophils Relative %: 76 %
PLATELETS: 271 10*3/uL (ref 150–400)
RBC: 3.42 MIL/uL — ABNORMAL LOW (ref 3.87–5.11)
RDW: 18.5 % — AB (ref 11.5–15.5)
WBC: 12.1 10*3/uL — ABNORMAL HIGH (ref 4.0–10.5)

## 2017-07-22 MED ORDER — FENTANYL 25 MCG/HR TD PT72: 25.0000 ug | MEDICATED_PATCH | TRANSDERMAL | 0 refills | Status: DC

## 2017-07-22 MED ORDER — ZOLPIDEM TARTRATE 5 MG PO TABS: 10.0000 mg | ORAL_TABLET | Freq: Every evening | ORAL | 0 refills | Status: DC | PRN

## 2017-07-22 MED ORDER — ZOLPIDEM TARTRATE 5 MG PO TABS
5.0000 mg | ORAL_TABLET | Freq: Every evening | ORAL | Status: DC | PRN
  Administered 2017-07-22: 5 mg via ORAL
  Filled 2017-07-22: qty 1

## 2017-07-22 NOTE — Progress Notes (Signed)
Pt requesting Ambien for sleep, none ordered. Dr. Bard Herbert note states to continue Ambien. Dr. Aggie Moats paged to see if he could order. Waiting for call back/orders

## 2017-07-22 NOTE — Care Management (Addendum)
Spoke with [facility] at [phone] to arrange [facility].  All details about pt given and referral complete.  Horris Latino spoke with family about obtaining hospital bed, O2 (PRN), and walker.  Horris Latino and family agree that pt could be discharged without equipment if not able to have delivered today.  Horris Latino states equipment can be delivered by tomorrow.  Horris Latino states agency needs an order from pt's PCP to start Hospice care and an assigned physician to manage Hospice care.  Paged Dr. Maudie Mercury, pt's listed PCP, through answering service.  No phone call returned.  Horris Latino will request MD at [facility] or ICU staff to get in touch with Dr. Maudie Mercury.  Once PCP is contacted, pt can be d/c to Hospice.   07/22/17 1030 Pt is undecided about discontinuing blood transfusions with Hospice care and wants to discuss with daughter.  Pt agreeable to stopping oral chemo drug.  Hospice uses [facility] for DME/meds and will be unable to obtain comfort meds after 11am.  Horris Latino suggests delaying d/c until tomorrow for pt to have time to make a decision.  ICU staff aware.  Horris Latino to continue to try to contact Dr. Maudie Mercury for order, as she expects pt to agree to Hospice care.  CM will continue to follow.

## 2017-07-22 NOTE — Progress Notes (Signed)
PROGRESS NOTE   Paula Navarro  NUU:[id number]  DOB: 08-29-1950  DOA: 07/19/2017 PCP: Paula Gravel, MD  Brief Admission Hx: Paula Navarro is a 66 y.o. female with medical history significant of microcytic anemia, anxiety, history of atrial septal defect and repair in 1996, metastatic breast cancer, COPD, chronic bronchitis, endometrial cancer, palpitations/tachycardia, previous tobacco abuse, chronic pain, mild constipation from narcotics use who is coming to the emergency department with complaints of abdominal pain associated with nausea, dysuria, fever and progressively worse weakness for the past 2 days.  She denies emesis, diarrhea, melena or hematochezia. She denies chest pain, but complains of dizziness while sitting or standing up, fatigue and frequent palpitations while exerting.  She also complains of chronic headache, back, abdomen, chest, joints and muscle pain, which is related to metastatic lesions.  She complains of dysuria, but denies frequency or gross hematuria.  MDM/Assessment & Plan:     Symptomatic anemia Pt feels much better after PRBC transfusion. Hg now up to 9.1    Postherpetic neuralgia Neuropathy due to chemotherapeutic drug (HCC) Continue gabapentin 600 mg p.o. 3 times daily. Currently on a 25 mcg Duragesic patch every 72 hours. Continue hydromorphone as needed    Metastatic breast cancer Pt has decided to transition to full comfort care and has agreed to home hospice which is currently being arranged.     Anxiety Continue lorazepam 1 mg p.o. at bedtime as needed.    Palpitations Resolved now    Insomnia Continue lorazepam 1 mg p.o. at bedtime. Continue zolpidem at bedtime as needed.    Scalp lesion Full comfort care    Hypomagnesemia Replaced.    Hyponatremia Likely due to decreased oral intake/hypoalbuminemia. Continue gentle/time limited normal saline infusion.  DVT prophylaxis: Lovenox SQ. Code Status: Full code. Family  Communication:  Disposition Plan: home with hospice when it can be arranged   Subjective: Pt says she felt better this morning.   Objective: Vitals:   07/21/17 1500 07/21/17 1928 07/22/17 0200 07/22/17 0730  BP: 113/76 101/69 117/63 109/67  Pulse: 90     Resp:  14 20 18   Temp: 98.1 F (36.7 C) 97.9 F (36.6 C) 98.1 F (36.7 C) 98.2 F (36.8 C)  TempSrc: Oral Oral Oral Oral  SpO2:  99%  97%  Weight:      Height:        Intake/Output Summary (Last 24 hours) at 07/22/2017 1041 Last data filed at 07/21/2017 1300 Gross per 24 hour  Intake 1315 ml  Output -  Net 1315 ml   Filed Weights   07/19/17 2203 07/20/17 0300 07/21/17 0935  Weight: 52.2 kg (115 lb) 68.1 kg (150 lb 2.1 oz) 49.7 kg (109 lb 9.1 oz)     REVIEW OF SYSTEMS  As per history otherwise all reviewed and reported negative  Exam:  General exam: chronically ill appearing, NAD Respiratory system: Clear. No increased work of breathing. Cardiovascular system: S1 & S2 heard, Gastrointestinal system: Abdomen is nondistended, soft and nontender. Normal bowel sounds heard. Central nervous system: Alert and oriented. No focal neurological deficits. Extremities: no CCE.  Data Reviewed: Basic Metabolic Panel: Recent Labs  Lab 07/19/17 1051 07/19/17 2225 07/20/17 0529  NA 134* 131* 132*  K 3.9 3.9 4.0  CL  --  95* 98*  CO2 27 24 24   GLUCOSE 104 119* 108*  BUN 14.0 19 18  CREATININE 0.8 0.69 0.69  CALCIUM 9.6 9.2 8.8*  MG  --  1.1*  --  PHOS  --  3.1  --    Liver Function Tests: Recent Labs  Lab 07/19/17 1051 07/19/17 2225  AST 30 31  ALT 12 13*  ALKPHOS 64 55  BILITOT 0.42 0.6  PROT 7.0 6.7  ALBUMIN 2.5* 2.5*   Recent Labs  Lab 07/19/17 2225  LIPASE 19   No results for input(s): AMMONIA in the last 168 hours. CBC: Recent Labs  Lab 07/19/17 1051  07/19/17 2225 07/20/17 0529 07/20/17 1602 07/20/17 2100 07/21/17 0630 07/22/17 0417  WBC 10.3  --  9.9 10.8*  --  12.6* 10.9* 12.1*    NEUTROABS 8.3*  --  8.1* 8.0*  --   --  8.7* 9.2*  HGB 6.7*   < > 7.2* 7.4* 8.1* 6.7* 4.9* 9.1*  HCT 21.0*   < > 23.2* 23.5* 25.3* 20.7* 15.9* 27.8*  MCV 73.0*  --  74.6* 77.6*  --  77.8* 78.3 81.3  PLT 444*  --  422* 359  --  338 276 271   < > = values in this interval not displayed.   Cardiac Enzymes: No results for input(s): CKTOTAL, CKMB, CKMBINDEX, TROPONINI in the last 168 hours. CBG (last 3)  No results for input(s): GLUCAP in the last 72 hours. Recent Results (from the past 240 hour(s))  Culture, blood (Routine X 2) w Reflex to ID Panel     Status: None (Preliminary result)   Collection Time: 07/19/17 10:25 PM  Result Value Ref Range Status   Specimen Description BLOOD RIGHT ARM  Final   Special Requests   Final    BOTTLES DRAWN AEROBIC AND ANAEROBIC BLOOD RIGHT ARM   Culture NO GROWTH 3 DAYS  Final   Report Status PENDING  Incomplete  Culture, blood (Routine X 2) w Reflex to ID Panel     Status: None (Preliminary result)   Collection Time: 07/19/17 10:36 PM  Result Value Ref Range Status   Specimen Description BLOOD RIGHT ARM  Final   Special Requests   Final    BOTTLES DRAWN AEROBIC AND ANAEROBIC Blood Culture adequate volume   Culture NO GROWTH 3 DAYS  Final   Report Status PENDING  Incomplete  Culture, Urine     Status: Abnormal   Collection Time: 07/20/17  1:32 AM  Result Value Ref Range Status   Specimen Description URINE, CLEAN CATCH  Final   Special Requests Immunocompromised  Final   Culture MULTIPLE SPECIES PRESENT, SUGGEST RECOLLECTION (A)  Final   Report Status 07/21/2017 FINAL  Final  MRSA PCR Screening     Status: None   Collection Time: 07/21/17  9:40 AM  Result Value Ref Range Status   MRSA by PCR NEGATIVE NEGATIVE Final    Comment:        The GeneXpert MRSA Assay (FDA approved for NASAL specimens only), is one component of a comprehensive MRSA colonization surveillance program. It is not intended to diagnose MRSA infection nor to guide  or monitor treatment for MRSA infections.      Studies: Ct Angio Abd/pel W/ And/or W/o  Result Date: 07/21/2017 CLINICAL DATA:  Rectal bleeding and GI bleed requiring transfusion. History of metastatic breast carcinoma and endometrial carcinoma with known large right-sided retroperitoneal/para-aortic soft tissue mass. EXAM: CT ANGIOGRAPHY ABDOMEN AND PELVIS WITH CONTRAST AND WITHOUT CONTRAST TECHNIQUE: Multidetector CT imaging of the abdomen and pelvis was performed using the standard protocol during bolus administration of intravenous contrast. Multiplanar reconstructed images and MIPs were obtained and reviewed to evaluate the vascular  anatomy. CONTRAST:  193mL ISOVUE-370 IOPAMIDOL (ISOVUE-370) INJECTION 76% COMPARISON:  CT of the abdomen and pelvis without contrast on 06/11/2017 and CT of the chest, abdomen and pelvis with contrast on 02/05/2017 FINDINGS: VASCULAR Aorta: The abdominal aorta is normally patent. There is no evidence of aortoenteric fistula by CTA or aneurysmal disease. Celiac: Normally patent. Distal branches are well visualized and normally patent. SMA: Normally patent. Renals: Bilateral single renal arteries demonstrate normal patency. IMA: Normally patent. Inflow: Bilateral iliac artery is demonstrate scattered calcified plaque without evidence of stenosis or aneurysm. Proximal Outflow: Normally patent common femoral artery's and femoral bifurcations. Veins: Venous phase imaging shows normal patency of the mesenteric veins, splenic vein and portal vein. Common femoral and iliac vein show normal patency. The IVC is compressed by the right-sided retroperitoneal mass without evidence of thrombosis. Review of the MIP images confirms the above findings. NON-VASCULAR Lower chest: Several right lower lung metastatic masses have enlarged since the prior CT on 06/11/2017. Anterior subpleural right middle lobe mass now measures 2.5 cm compared to 1.5 cm. Posterior right lower lobe mass measures  2.0 cm compared to 1.5 cm. No associated basilar pleural effusions. Hepatobiliary: No focal hepatic masses are identified. There is evidence of mild intrahepatic and extrahepatic biliary dilatation which may be secondary to mass effect from the large right-sided retroperitoneal mass. Correlation suggested with bilirubin level. The gallbladder has been removed. Pancreas: Unremarkable. No pancreatic ductal dilatation or surrounding inflammatory changes. Spleen: Normal in size without focal abnormality. Adrenals/Urinary Tract: Adrenal glands are unremarkable. Kidneys are normal, without renal calculi, focal lesion, or hydronephrosis. Bladder is unremarkable. Stomach/Bowel: No evidence of bowel obstruction or inflammation. A large right-sided retroperitoneal mass may be invading the duodenum and could be a source of bleeding into the duodenal lumen. On arterial and venous phases of imaging, and no obvious contrast extravasation is noted into the bowel. No free air. Lymphatic: The large right retroperitoneal soft tissue mass represents either a lymph node metastasis or soft tissue metastasis. This shows enlargement since prior imaging and now measures approximately 7.4 x 7.6 x 7.0 cm with maximal oblique diameter of 8.3 cm. This mass shows irregular contrast enhancement with some areas of central necrosis. The mass exerts significant mass effect on the duodenum and may be invading the duodenum. The mass also may invade adjacent vascular structures including branches of the superior mesenteric artery, IVC, left renal vein, aorta and right common iliac artery. There is no obvious fistula or contrast extravasation. No thrombus is identified in any of these vascular structures. Contiguous necrotic soft tissue mass likely representing lymphadenopathy in the right common iliac chain measures approximately 3 cm in short axis and has enlarged since prior imaging. There also likely is tumor versus lymphadenopathy deeper in the  right pelvis medial to branches of the internal iliac artery and measuring approximately 1.4 cm. Reproductive: Status post hysterectomy. No adnexal masses. Other: No hernias identified.  No significant ascites. Musculoskeletal: No bone metastases or fractures identified. IMPRESSION: VASCULAR 1. No evidence of aortoenteric fistula or other obvious arterial source of bleeding into the gastrointestinal tract by CTA. 2. The large 8 cm right-sided retroperitoneal metastatic lymph node/mass exerts significant mass effect on the IVC as well as additional adjacent vascular structures and may be causing vascular invasion without evidence of visible thrombosis by CTA. The mass may be invading the duodenum and causing bleeding into the duodenal lumen. NON-VASCULAR 1. Progression of metastatic disease with enlargement of right basilar metastatic lung nodules as well as enlargement  of the large right-sided retroperitoneal soft tissue mass which likely represents a huge metastatic lymph node mass. As above, this mass exerts significant mass effect on the duodenum and may be invading the duodenum. This could be a source of bleeding into the duodenal lumen. The mass also may be causing some relative mass-effect on the common bile duct resulting in some mild intrahepatic and extrahepatic biliary ductal dilatation. 2. Continuous necrotic lymph no metastasis in the right common iliac chain demonstrates enlargement since prior imaging. Probable tumor deposit in the right internal iliac artery chain is also present deeper in the right pelvis. Electronically Signed   By: Aletta Edouard M.D.   On: 07/21/2017 13:22   Scheduled Meds: . feeding supplement (ENSURE ENLIVE)  237 mL Oral BID BM  . fentaNYL  25 mcg Transdermal Q72H  . gabapentin  600 mg Oral TID   Continuous Infusions:  Principal Problem:   SIRS (systemic inflammatory response syndrome) (HCC) Active Problems:   Palpitations   Neuropathy due to chemotherapeutic drug  (HCC)   Anxiety   Postherpetic neuralgia   Metastatic breast cancer (HCC)   Insomnia   Scalp lesion   Hypomagnesemia   Hyponatremia   Symptomatic anemia   Rectal bleed  Time spent:   Irwin Brakeman, MD, FAAFP [facility] Pager [phone]  If 7PM-7AM, please contact night-coverage [email or web address] Password [email or web address] 07/22/2017, 10:41 AM    LOS: 1 day

## 2017-07-22 NOTE — Progress Notes (Signed)
New order for Ambien 5mg . Given to per request. Will continue to monitor pt

## 2017-07-22 NOTE — Progress Notes (Signed)
Report called to [facility]. Informed Larene Beach of plan for daughter and son to discuss hospice treatment options. Upon getting patient ready for discharge to hospice, the patient did agree to chemo, but did not agree to not receiving blood transfusions in hospice. Spoke with Horris Latino from [facility] and informed of patients decision to speak with daughter and son about hospice and associated treatment options. Patient acknowledges she understands hospice, but still wants to receive blood transfusion.  She will discuss with family today and update hospice team.  Horris Latino from Hospice care made aware of patient wishes and MD [facility].  Will hold off on discharge for family to discuss hospice care, they will notify hospice care team. Patient transported to [facility] with no complications, stable.

## 2017-07-22 NOTE — Plan of Care (Signed)
progressing 

## 2017-07-23 DIAGNOSIS — G620 Drug-induced polyneuropathy: Secondary | ICD-10-CM

## 2017-07-23 DIAGNOSIS — T451X5A Adverse effect of antineoplastic and immunosuppressive drugs, initial encounter: Secondary | ICD-10-CM

## 2017-07-23 DIAGNOSIS — L989 Disorder of the skin and subcutaneous tissue, unspecified: Secondary | ICD-10-CM

## 2017-07-23 DIAGNOSIS — R002 Palpitations: Secondary | ICD-10-CM

## 2017-07-23 LAB — TYPE AND SCREEN
ABO/RH(D): AB POS
Antibody Screen: NEGATIVE
DONOR AG TYPE: NEGATIVE
DONOR AG TYPE: NEGATIVE
Donor AG Type: NEGATIVE
Donor AG Type: NEGATIVE
Donor AG Type: NEGATIVE
Donor AG Type: NEGATIVE
UNIT DIVISION: 0
UNIT DIVISION: 0
UNIT DIVISION: 0
UNIT DIVISION: 0
UNIT DIVISION: 0
Unit division: 0

## 2017-07-23 LAB — BPAM RBC
BLOOD PRODUCT EXPIRATION DATE: 201901142359
BLOOD PRODUCT EXPIRATION DATE: 201901162359
BLOOD PRODUCT EXPIRATION DATE: 201901172359
BLOOD PRODUCT EXPIRATION DATE: 201901182359
Blood Product Expiration Date: 201901162359
Blood Product Expiration Date: 201901222359
ISSUE DATE / TIME: 201812210107
ISSUE DATE / TIME: 201812211124
ISSUE DATE / TIME: 201812220946
ISSUE DATE / TIME: 201812221237
UNIT TYPE AND RH: 5100
UNIT TYPE AND RH: 5100
UNIT TYPE AND RH: 5100
Unit Type and Rh: 5100
Unit Type and Rh: 5100
Unit Type and Rh: 5100

## 2017-07-23 MED ORDER — HEPARIN SOD (PORK) LOCK FLUSH 100 UNIT/ML IV SOLN
500.0000 [IU] | INTRAVENOUS | Status: DC | PRN
  Administered 2017-07-23: 500 [IU]
  Filled 2017-07-23: qty 5

## 2017-07-23 MED ORDER — BOOST PO LIQD: 237.0000 mL | Freq: Three times a day (TID) | ORAL | 0 refills | Status: AC

## 2017-07-23 NOTE — Care Management (Signed)
    Durable Medical Equipment  (From admission, onward)        Start     Ordered   07/23/17 1104  For home use only DME Shower stool  Once     07/23/17 1103   07/23/17 1005  For home use only DME Hospital bed  Once    Question Answer Comment  The above medical condition requires: Patient requires the ability to reposition frequently   Head must be elevated greater than: 30 degrees   Bed type Semi-electric      07/23/17 1004   07/23/17 1004  For home use only DME Walker rolling  Once    Comments:  ROLLATOR  Question:  Patient needs a walker to treat with the following condition  Answer:  Weakness   07/23/17 1004

## 2017-07-23 NOTE — Progress Notes (Signed)
After receiving Ambien, pt states she can't sleep d/t having a lot on her mind. States she is anxious about a lot, her husband died in July 19, 2023 and she is thinking of everyone else but herself.  Pt given Lorazepam 1mg  per PRN order. Will continue to monitor pt

## 2017-07-23 NOTE — Care Management Important Message (Signed)
Important Message  Patient Details  Name: RICKETTA COLANTONIO MRN: [id number] Date of Birth: [birth year]   Medicare Important Message Given:  Yes    Uzziah Rigg, Chauncey Reading, RN 07/23/2017, 10:14 AM

## 2017-07-23 NOTE — Discharge Instructions (Signed)
Follow with Primary MD  [name], MD  and other consultant's as instructed your Hospitalist MD  Please get a complete blood count and chemistry panel checked by your Primary MD at your next visit, and again as instructed by your Primary MD.  Get Medicines reviewed and adjusted: Please take all your medications with you for your next visit with your Primary MD  Laboratory/radiological data: Please request your Primary MD to go over all hospital tests and procedure/radiological results at the follow up, please ask your Primary MD to get all Hospital records sent to his/her office.  In some cases, they will be blood work, cultures and biopsy results pending at the time of your discharge. Please request that your primary care M.D. follows up on these results.  Also Note the following: If you experience worsening of your admission symptoms, develop shortness of breath, life threatening emergency, suicidal or homicidal thoughts you must seek medical attention immediately by calling 911 or calling your MD immediately  if symptoms less severe.  You must read complete instructions/literature along with all the possible adverse reactions/side effects for all the Medicines you take and that have been prescribed to you. Take any new Medicines after you have completely understood and accpet all the possible adverse reactions/side effects.   Do not drive when taking Pain medications or sleeping medications (Benzodaizepines)  Do not take more than prescribed Pain, Sleep and Anxiety Medications. It is not advisable to combine anxiety,sleep and pain medications without talking with your primary care practitioner  Special Instructions: If you have smoked or chewed Tobacco  in the last 2 yrs please stop smoking, stop any regular Alcohol  and or any Recreational drug use.  Wear Seat belts while driving.  Please note: You were cared for by a hospitalist during your hospital stay. Once you are discharged, your  primary care physician will handle any further medical issues. Please note that NO REFILLS for any discharge medications will be authorized once you are discharged, as it is imperative that you return to your primary care physician (or establish a relationship with a primary care physician if you do not have one) for your post hospital discharge needs so that they can reassess your need for medications and monitor your lab values.      Anemia Anemia is a condition in which you do not have enough red blood cells or hemoglobin. Hemoglobin is a substance in red blood cells that carries oxygen. When you do not have enough red blood cells or hemoglobin (are anemic), your body cannot get enough oxygen and your organs may not work properly. As a result, you may feel very tired or have other problems. What are the causes? Common causes of anemia include:  Excessive bleeding. Anemia can be caused by excessive bleeding inside or outside the body, including bleeding from the intestine or from periods in women.  Poor nutrition.  Long-lasting (chronic) kidney, thyroid, and liver disease.  Bone marrow disorders.  Cancer and treatments for cancer.  HIV (human immunodeficiency virus) and AIDS (acquired immunodeficiency syndrome).  Treatments for HIV and AIDS.  Spleen problems.  Blood disorders.  Infections, medicines, and autoimmune disorders that destroy red blood cells.  What are the signs or symptoms? Symptoms of this condition include:  Minor weakness.  Dizziness.  Headache.  Feeling heartbeats that are irregular or faster than normal (palpitations).  Shortness of breath, especially with exercise.  Paleness.  Cold sensitivity.  Indigestion.  Nausea.  Difficulty sleeping.  Difficulty concentrating.  Symptoms may occur suddenly or develop slowly. If your anemia is mild, you may not have symptoms. How is this diagnosed? This condition is diagnosed based on:  Blood  tests.  Your medical history.  A physical exam.  Bone marrow biopsy.  Your health care provider may also check your stool (feces) for blood and may do additional testing to look for the cause of your bleeding. You may also have other tests, including:  Imaging tests, such as a CT scan or MRI.  Endoscopy.  Colonoscopy.  How is this treated? Treatment for this condition depends on the cause. If you continue to lose a lot of blood, you may need to be treated at a hospital. Treatment may include:  Taking supplements of iron, vitamin X41, or folic acid.  Taking a hormone medicine (erythropoietin) that can help to stimulate red blood cell growth.  Having a blood transfusion. This may be needed if you lose a lot of blood.  Making changes to your diet.  Having surgery to remove your spleen.  Follow these instructions at home:  Take over-the-counter and prescription medicines only as told by your health care provider.  Take supplements only as told by your health care provider.  Follow any diet instructions that you were given.  Keep all follow-up visits as told by your health care provider. This is important. Contact a health care provider if:  You develop new bleeding anywhere in the body. Get help right away if:  You are very weak.  You are short of breath.  You have pain in your abdomen or chest.  You are dizzy or feel faint.  You have trouble concentrating.  You have bloody or black, tarry stools.  You vomit repeatedly or you vomit up blood. Summary  Anemia is a condition in which you do not have enough red blood cells or enough of a substance in your red blood cells that carries oxygen (hemoglobin).  Symptoms may occur suddenly or develop slowly.  If your anemia is mild, you may not have symptoms.  This condition is diagnosed with blood tests as well as a medical history and physical exam. Other tests may be needed.  Treatment for this condition depends  on the cause of the anemia. This information is not intended to replace advice given to you by your health care provider. Make sure you discuss any questions you have with your health care provider. Document Released: 08/24/2004 Document Revised: 08/18/2016 Document Reviewed: 08/18/2016 Elsevier Interactive Patient Education  2018 [employer].   Blood Transfusion, Adult, Care After This sheet gives you information about how to care for yourself after your procedure. Your health care provider may also give you more specific instructions. If you have problems or questions, contact your health care provider. What can I expect after the procedure? After your procedure, it is common to have:  Bruising and soreness where the IV tube was inserted.  Headache.  Follow these instructions at home:  Take over-the-counter and prescription medicines only as told by your health care provider.  Return to your normal activities as told by your health care provider.  Follow instructions from your health care provider about how to take care of your IV insertion site. Make sure you: ? Wash your hands with soap and water before you change your bandage (dressing). If soap and water are not available, use hand sanitizer. ? Change your dressing as told by your health care provider.  Check your IV insertion site every day for  signs of infection. Check for: ? More redness, swelling, or pain. ? More fluid or blood. ? Warmth. ? Pus or a bad smell. Contact a health care provider if:  You have more redness, swelling, or pain around the IV insertion site.  You have more fluid or blood coming from the IV insertion site.  Your IV insertion site feels warm to the touch.  You have pus or a bad smell coming from the IV insertion site.  Your urine turns pink, red, or brown.  You feel weak after doing your normal activities. Get help right away if:  You have signs of a serious allergic or immune system  reaction, including: ? Itchiness. ? Hives. ? Trouble breathing. ? Anxiety. ? Chest or lower back pain. ? Fever, flushing, and chills. ? Rapid pulse. ? Rash. ? Diarrhea. ? Vomiting. ? Dark urine. ? Serious headache. ? Dizziness. ? Stiff neck. ? Yellow coloration of the face or the white parts of the eyes (jaundice). This information is not intended to replace advice given to you by your health care provider. Make sure you discuss any questions you have with your health care provider. Document Released: 08/07/2014 Document Revised: 03/15/2016 Document Reviewed: 01/31/2016 Elsevier Interactive Patient Education  [employer].

## 2017-07-23 NOTE — Care Management (Signed)
Patient wants a shower stool. Aware insurance will not cover cost. Juliann Pulse of [facility] notified.

## 2017-07-23 NOTE — Discharge Summary (Addendum)
Physician Discharge Summary  Paula Navarro ZDG:[id number] DOB: Jul 13, 1951 DOA: 07/19/2017  PCP: [name], MD [name], MD as PCP - General (Internal Medicine) Rothbart, Cristopher Estimable, MD (Cardiology) [name], MD as Consulting Physician (Gastroenterology) Delice Bison, Charlestine Massed, NP as Nurse Practitioner (Hematology and Oncology) Nicholas Lose, MD as Consulting Physician (Hematology and Oncology) Kyung Rudd, MD as Consulting Physician (Radiation Oncology)  Admit date: 07/19/2017 Discharge date: 07/23/2017  Admitted From: Home  Disposition: Home with home health services  / Pt declined hospice options   PT IS HIGH RISK FOR READMISSION Pt has decided to transition to full comfort care in hospital but has decided against home hospice care but decided to discontinue taking the chemotherapy but would like palliative blood transfusions as needed  Recommendations for Outpatient Follow-up:  1. Follow up with Oncologist / PCP / and GI  in 1-2 weeks 2. Home health RN to obtain CBC in 3 days and report to oncologist 3. Please continue to address hospice options with patient  4. Please follow up on the following pending results:  Home Health: RN  Discharge Condition: STABLE but GUARDED CODE STATUS: DNR  Brief Hospitalization Summary: Please see all hospital notes, images, labs for full details of the hospitalization. HPI: Paula Navarro is a 66 y.o. female with medical history significant of microcytic anemia, anxiety, history of atrial septal defect and repair in 1996, metastatic breast cancer, COPD, chronic bronchitis, endometrial cancer, palpitations/tachycardia, previous tobacco abuse, chronic pain, mild constipation from narcotics use who is coming to the emergency department with complaints of abdominal pain associated with nausea, dysuria, fever and progressively worse weakness for the past 2 days.  She denies emesis, diarrhea, melena or hematochezia. She denies chest pain, but  complains of dizziness while sitting or standing up, fatigue and frequent palpitations while exerting.  She also complains of chronic headache, back, abdomen, chest, joints and muscle pain, which is related to metastatic lesions.  She complains of dysuria, but denies frequency or gross hematuria.  ED Course: Initial vital signs in the emergency department temperature 38.1C (100.5 F), pulse 97, respirations 16, O2 sat 98% on room air and blood pressure 97/69 mmHg.    Her workup shows an urinalysis with moderate leukocyturia with 6-30 WBC per HPF, but without bacteria.  Her CBC shows a white count of 9.9 with 82% neutrophils, hemoglobin 7.2 g/dL and platelets 422.  Her sodium was 131, potassium 3.9, chloride 95 and CO2 24 mmol/L.  Glucose is 119, BUN 19, creatinine 0.69, calcium 6.7, magnesium 1.1 and phosphorus 3.1 mg/dL.  Her lipase level and lactic acid were normal.    Imaging: abdomen/chest x-ray shows air-fluid levels without bowel obstruction.  Suspect enteritis or early ileus.  Bowel obstruction felt to be less likely.  There is moderate stool throughout the colon.  There are nodular opacities in the right lung base, likely metastatic in nature.  There was no lung edema or consolidation.  The patient received 500 mL of normal saline bolus, hydromorphone 2 mg IVP x1 dose and a 2 PRBC unit transfusion was started.  AAS on admission showed air-fluid levels without bowel obstruction. Admitting Hgb was 7.2. Transfused two units this admission and up to 8/1. Yesterday evening episode of rectal bleeding. Patient described as maroon stool. Hgb down to 6.7. There has been delay in obtaining blood, coming from [facility] due to blood type and antibodies. Hgb down to 4.9 this morning. She describes one large bloody stool yesterday with one additional smaller volume  bloody stool since then. At home, reports black stool off/on. Notably she was transfused a couple of weeks ago as well. She complains of chronic  back/abd pain but feels it is worse today. No n/v. Poor appetite.  She denies NSAID or aspirin use.  Denies heartburn, dysphagia.  Generally bowel movements are okay per her report.  She had a CT abdomen pelvis without contrast on June 11, 2017.  On that study she had new or increased bulky right retroperitoneal/periaortic mass measuring 6.6 x 5.8 cm.  Also with a 13 mm left upper quadrant soft tissue nodule anterior to the distal transverse colon which was new   GI notes: Reviewed CTA A/P.  No evidence of aortoenteric fistula or other obvious arterial source of bleeding into the GI tract seen on the study.  Enlargement of large right-sided retroperitoneal soft tissue mass now measuring 8 cm producing significant mass-effect on the duodenum and may be invading the duodenum resulting in bleeding into the duodenal lumen.  This large mass is also compressing the IVC as well as additional adjacent vascular structures and could be causing vascular invasion.    Discussed at length with Dr. Gala Romney.  Endoscopy not recommended for above findings.  Evidence of progressive metastatic disease with likely tumor invading duodenal lumen and adjacent vascular structures.  Nothing to be offered via endoscopy for these findings.  Transfer of patient to facility with more adequate blood products would not be unreasonable but not absolutely necessary.  Consider readdressing code status given current findings.  GI was consulted and ordered a CTA of the abdomen and pelvis that did not show evidence of an aortoenteric fistula although there was enlargement of the large right sided retroperitoneal soft tissue mass now measuring 8 cm that was producing significant mass-effect on the duodenum and may be invading the duodenum resulting in bleeding into the duodenal lumen.  This large mass is also compressing the IVC as well as the adjacent vascular structures and could be causing vascular invasion.  GI believes that endoscopy  would not control the source of bleeding (I agree).  CT also shows evidence of progressive metastatic disease with the tumor.  I had a long discussion with patient her son and daughter at bedside.  Explained findings of CT scan.  We have decided to transition to comfort care.  We have agreed on DNR status.  Her one complaint is significant abdominal and lower back pain.  We will increase her IV Dilaudid, placed on oral Roxanol as well as continue her fentanyl patch.  Have also added Ativan as needed for anxiety or shortness of breath.  Patient's wish is to go home with hospice, discussed at length with patient and daughter.  Daughter is okay with this plan and is 100% willing to take care of her mother.  Explained to daughter how emotionally draining and traumatic it may be to see her mother essentially "bleed to death".  Although she seems adequately tearful about it she is adamant that she will take care of her mother.  I have placed a call to case management to see about arranging home hospice care.   Pt changed her mind and decided to decline hospice services.  She decided to go home with home health care.  We tried multiple times to speak with patient and explain to her the benefits of hospice and that she again declined.  She will be discharged home with [facility] RN.  Check CBC in 3 days.  She was strongly advised  to follow up and discuss this with her oncologist and PCP.  She verbalized understanding.  She is likely going to need frequent recurrent blood transfusions before ultimately bleeding to death.  She is at high risk of bleeding to death.  This was explained to patient and family on multiple occasions and they have decided to take her home.    Acute Blood Loss Anemia  Pt feels much better after PRBC transfusion. Hg now up to 9.1 Pt has decided to transition to full comfort care in hospital but has decided against home hospice care but decided to discontinue taking the chemotherapy but would like  palliative blood transfusions as needed Postherpetic neuralgia Neuropathy due to chemotherapeutic drug (HCC) Continue gabapentin 600 mg p.o. 3 times daily. Currently on a 25 mcg Duragesic patch every 72 hours. Continue hydromorphone as needed  Metastatic breast cancer Pt has decided to transition to full comfort care in hospital but has decided against home hospice care but decided to discontinue taking the chemotherapy but would like palliative blood transfusions as needed.   Anxiety Continue lorazepam as needed.  Palpitations Resolved now  Insomnia Continue lorazepam 1 mg p.o. at bedtime. Continue zolpidem at bedtime as needed.  Scalp lesion  stable  Hypomagnesemia Replaced.   DVT prophylaxis:Lovenox SQ. Code Status:Full code. Family Communication: Disposition Plan:home with home health  Discharge Diagnoses:  Principal Problem:   SIRS (systemic inflammatory response syndrome) (HCC) Active Problems:   Palpitations   Neuropathy due to chemotherapeutic drug (HCC)   Anxiety   Postherpetic neuralgia   Metastatic breast cancer (HCC)   Insomnia   Scalp lesion   Hypomagnesemia   Hyponatremia   Symptomatic anemia   Rectal bleed    Discharge Instructions: Discharge Instructions    Call MD for:  difficulty breathing, headache or visual disturbances   Complete by:  As directed    Call MD for:  extreme fatigue   Complete by:  As directed    Call MD for:  persistant dizziness or light-headedness   Complete by:  As directed    Call MD for:  persistant nausea and vomiting   Complete by:  As directed    Call MD for:  severe uncontrolled pain   Complete by:  As directed    Diet - low sodium heart healthy   Complete by:  As directed    Increase activity slowly   Complete by:  As directed      Allergies as of 07/23/2017      Reactions   Codeine Other (See Comments)   Hot in chest   Morphine And Related Itching, Rash   Oxycodone Hcl Rash    Red rash      Medication List    STOP taking these medications   calcium-vitamin D 500-400 MG-UNIT tablet Commonly known as:  OSCAL-500   Clobetasol Prop Emollient Base 0.05 % emollient cream Commonly known as:  CLOBETASOL PROPIONATE E   ibuprofen 600 MG tablet Commonly known as:  ADVIL,MOTRIN   LYNPARZA 100 MG tablet Generic drug:  olaparib   nystatin 100000 UNIT/ML suspension Commonly known as:  MYCOSTATIN   SUPER B COMPLEX PO     TAKE these medications   acetaminophen 500 MG tablet Commonly known as:  TYLENOL Take 500 mg by mouth every 6 (six) hours as needed.   docusate sodium 100 MG capsule Commonly known as:  COLACE Take 1 capsule (100 mg total) by mouth 2 (two) times daily. What changed:  when to take this  fentaNYL 25 MCG/HR patch Commonly known as:  DURAGESIC - dosed mcg/hr Place 1 patch (25 mcg total) onto the skin every 3 (three) days.   gabapentin 300 MG capsule Commonly known as:  NEURONTIN TAKE 2 CAPSULES (600 MG TOTAL) BY MOUTH 3 (THREE) TIMES DAILY.   HYDROmorphone 4 MG tablet Commonly known as:  DILAUDID Take 1 tablet (4 mg total) by mouth every 4 (four) hours as needed for severe pain.   LORazepam 1 MG tablet Commonly known as:  ATIVAN TAKE 1 TABLET BY MOUTH EVERYDAY AT BEDTIME What changed:    how much to take  how to take this  when to take this  additional instructions   polyethylene glycol packet Commonly known as:  MIRALAX / GLYCOLAX Take 17 g by mouth 2 (two) times daily.   prochlorperazine 10 MG tablet Commonly known as:  COMPAZINE Take 1 tablet (10 mg total) by mouth every 6 (six) hours as needed (Nausea or vomiting).   zolpidem 5 MG tablet Commonly known as:  AMBIEN Take 2 tablets (10 mg total) by mouth at bedtime as needed for up to 5 days for sleep. What changed:    medication strength  when to take this  reasons to take this            Durable Medical Equipment  (From admission, onward)         Start     Ordered   07/23/17 1005  For home use only DME Hospital bed  Once    Question Answer Comment  The above medical condition requires: Patient requires the ability to reposition frequently   Head must be elevated greater than: 30 degrees   Bed type Semi-electric      07/23/17 1004   07/23/17 1004  For home use only DME Walker rolling  Once    Comments:  ROLLATOR  Question:  Patient needs a walker to treat with the following condition  Answer:  Weakness   07/23/17 1004     Follow-up Information    [name], MD. Schedule an appointment as soon as possible for a visit in 1 week(s).   Specialty:  Internal Medicine Why:  Hospital Follow Up, check labs Contact information: [address] [place] [address] [phone]        Nicholas Lose, MD. Schedule an appointment as soon as possible for a visit in 1 week(s).   Specialty:  Hematology and Oncology Contact information: [place] [address] [phone]        [name], MD. Schedule an appointment as soon as possible for a visit in 1 week(s).   Specialty:  Gastroenterology Why:  Hospital Follow Up  Contact information: [place] [address] [phone]          Allergies  Allergen Reactions  . Codeine Other (See Comments)    Hot in chest  . Morphine And Related Itching and Rash  . Oxycodone Hcl Rash    Red rash   Allergies as of 07/23/2017      Reactions   Codeine Other (See Comments)   Hot in chest   Morphine And Related Itching, Rash   Oxycodone Hcl Rash   Red rash      Medication List    STOP taking these medications   calcium-vitamin D 500-400 MG-UNIT tablet Commonly known as:  OSCAL-500   Clobetasol Prop Emollient Base 0.05 % emollient cream Commonly known as:  CLOBETASOL PROPIONATE E  ibuprofen 600 MG tablet Commonly known as:  ADVIL,MOTRIN   LYNPARZA 100 MG tablet Generic drug:  olaparib   nystatin 100000  UNIT/ML suspension Commonly known as:  MYCOSTATIN   SUPER B COMPLEX PO     TAKE these medications   acetaminophen 500 MG tablet Commonly known as:  TYLENOL Take 500 mg by mouth every 6 (six) hours as needed.   docusate sodium 100 MG capsule Commonly known as:  COLACE Take 1 capsule (100 mg total) by mouth 2 (two) times daily. What changed:  when to take this   fentaNYL 25 MCG/HR patch Commonly known as:  DURAGESIC - dosed mcg/hr Place 1 patch (25 mcg total) onto the skin every 3 (three) days.   gabapentin 300 MG capsule Commonly known as:  NEURONTIN TAKE 2 CAPSULES (600 MG TOTAL) BY MOUTH 3 (THREE) TIMES DAILY.   HYDROmorphone 4 MG tablet Commonly known as:  DILAUDID Take 1 tablet (4 mg total) by mouth every 4 (four) hours as needed for severe pain.   LORazepam 1 MG tablet Commonly known as:  ATIVAN TAKE 1 TABLET BY MOUTH EVERYDAY AT BEDTIME What changed:    how much to take  how to take this  when to take this  additional instructions   polyethylene glycol packet Commonly known as:  MIRALAX / GLYCOLAX Take 17 g by mouth 2 (two) times daily.   prochlorperazine 10 MG tablet Commonly known as:  COMPAZINE Take 1 tablet (10 mg total) by mouth every 6 (six) hours as needed (Nausea or vomiting).   zolpidem 5 MG tablet Commonly known as:  AMBIEN Take 2 tablets (10 mg total) by mouth at bedtime as needed for up to 5 days for sleep. What changed:    medication strength  when to take this  reasons to take this            Durable Medical Equipment  (From admission, onward)        Start     Ordered   07/23/17 1005  For home use only DME Hospital bed  Once    Question Answer Comment  The above medical condition requires: Patient requires the ability to reposition frequently   Head must be elevated greater than: 30 degrees   Bed type Semi-electric      07/23/17 1004   07/23/17 1004  For home use only DME Walker rolling  Once    Comments:  ROLLATOR   Question:  Patient needs a walker to treat with the following condition  Answer:  Weakness   07/23/17 1004      Procedures/Studies: Dg Abd Acute W/chest  Result Date: 07/20/2017 CLINICAL DATA:  Metastatic breast carcinoma with pain and weakness EXAM: DG ABDOMEN ACUTE W/ 1V CHEST COMPARISON:  CT abdomen and pelvis June 11, 2017 ; chest CT February 05, 2017 FINDINGS: PA chest: There are nodular opacities in the right lower lobe, similar to recent CT, felt to represent metastatic foci. There is a nodular lesion measuring 2.0 x 1.1 cm with a nearby nodular lesion measuring 2.3 x 1.5 cm. There is no edema or consolidation. Port-A-Cath tip is in the superior vena cava with Huber needle within the port. Heart size and pulmonary vascularity are normal. No adenopathy. There is postoperative change in each axilla. Supine and upright abdomen: No bowel dilatation. Scattered air-fluid levels present. No free air. Moderate stool throughout colon. Phleboliths noted in pelvis. Clip noted right upper quadrant. IMPRESSION: 1. Air-fluid levels without bowel obstruction. Suspect enteritis or  early ileus. Bowel obstruction felt to be less likely. There is moderate stool throughout the colon. 2. Nodular opacities right lung base, likely metastatic foci. No lung edema or consolidation. Electronically Signed   By: Lowella Grip III M.D.   On: 07/20/2017 00:05   Ct Angio Abd/pel W/ And/or W/o  Result Date: 07/21/2017 CLINICAL DATA:  Rectal bleeding and GI bleed requiring transfusion. History of metastatic breast carcinoma and endometrial carcinoma with known large right-sided retroperitoneal/para-aortic soft tissue mass. EXAM: CT ANGIOGRAPHY ABDOMEN AND PELVIS WITH CONTRAST AND WITHOUT CONTRAST TECHNIQUE: Multidetector CT imaging of the abdomen and pelvis was performed using the standard protocol during bolus administration of intravenous contrast. Multiplanar reconstructed images and MIPs were obtained and reviewed to  evaluate the vascular anatomy. CONTRAST:  169mL ISOVUE-370 IOPAMIDOL (ISOVUE-370) INJECTION 76% COMPARISON:  CT of the abdomen and pelvis without contrast on 06/11/2017 and CT of the chest, abdomen and pelvis with contrast on 02/05/2017 FINDINGS: VASCULAR Aorta: The abdominal aorta is normally patent. There is no evidence of aortoenteric fistula by CTA or aneurysmal disease. Celiac: Normally patent. Distal branches are well visualized and normally patent. SMA: Normally patent. Renals: Bilateral single renal arteries demonstrate normal patency. IMA: Normally patent. Inflow: Bilateral iliac artery is demonstrate scattered calcified plaque without evidence of stenosis or aneurysm. Proximal Outflow: Normally patent common femoral artery's and femoral bifurcations. Veins: Venous phase imaging shows normal patency of the mesenteric veins, splenic vein and portal vein. Common femoral and iliac vein show normal patency. The IVC is compressed by the right-sided retroperitoneal mass without evidence of thrombosis. Review of the MIP images confirms the above findings. NON-VASCULAR Lower chest: Several right lower lung metastatic masses have enlarged since the prior CT on 06/11/2017. Anterior subpleural right middle lobe mass now measures 2.5 cm compared to 1.5 cm. Posterior right lower lobe mass measures 2.0 cm compared to 1.5 cm. No associated basilar pleural effusions. Hepatobiliary: No focal hepatic masses are identified. There is evidence of mild intrahepatic and extrahepatic biliary dilatation which may be secondary to mass effect from the large right-sided retroperitoneal mass. Correlation suggested with bilirubin level. The gallbladder has been removed. Pancreas: Unremarkable. No pancreatic ductal dilatation or surrounding inflammatory changes. Spleen: Normal in size without focal abnormality. Adrenals/Urinary Tract: Adrenal glands are unremarkable. Kidneys are normal, without renal calculi, focal lesion, or  hydronephrosis. Bladder is unremarkable. Stomach/Bowel: No evidence of bowel obstruction or inflammation. A large right-sided retroperitoneal mass may be invading the duodenum and could be a source of bleeding into the duodenal lumen. On arterial and venous phases of imaging, and no obvious contrast extravasation is noted into the bowel. No free air. Lymphatic: The large right retroperitoneal soft tissue mass represents either a lymph node metastasis or soft tissue metastasis. This shows enlargement since prior imaging and now measures approximately 7.4 x 7.6 x 7.0 cm with maximal oblique diameter of 8.3 cm. This mass shows irregular contrast enhancement with some areas of central necrosis. The mass exerts significant mass effect on the duodenum and may be invading the duodenum. The mass also may invade adjacent vascular structures including branches of the superior mesenteric artery, IVC, left renal vein, aorta and right common iliac artery. There is no obvious fistula or contrast extravasation. No thrombus is identified in any of these vascular structures. Contiguous necrotic soft tissue mass likely representing lymphadenopathy in the right common iliac chain measures approximately 3 cm in short axis and has enlarged since prior imaging. There also likely is tumor versus lymphadenopathy deeper in  the right pelvis medial to branches of the internal iliac artery and measuring approximately 1.4 cm. Reproductive: Status post hysterectomy. No adnexal masses. Other: No hernias identified.  No significant ascites. Musculoskeletal: No bone metastases or fractures identified. IMPRESSION: VASCULAR 1. No evidence of aortoenteric fistula or other obvious arterial source of bleeding into the gastrointestinal tract by CTA. 2. The large 8 cm right-sided retroperitoneal metastatic lymph node/mass exerts significant mass effect on the IVC as well as additional adjacent vascular structures and may be causing vascular invasion  without evidence of visible thrombosis by CTA. The mass may be invading the duodenum and causing bleeding into the duodenal lumen. NON-VASCULAR 1. Progression of metastatic disease with enlargement of right basilar metastatic lung nodules as well as enlargement of the large right-sided retroperitoneal soft tissue mass which likely represents a huge metastatic lymph node mass. As above, this mass exerts significant mass effect on the duodenum and may be invading the duodenum. This could be a source of bleeding into the duodenal lumen. The mass also may be causing some relative mass-effect on the common bile duct resulting in some mild intrahepatic and extrahepatic biliary ductal dilatation. 2. Continuous necrotic lymph no metastasis in the right common iliac chain demonstrates enlargement since prior imaging. Probable tumor deposit in the right internal iliac artery chain is also present deeper in the right pelvis. Electronically Signed   By: Aletta Edouard M.D.   On: 07/21/2017 13:22      Subjective: Pt says that she is not resting well in hospital but really wants to go home.  She says that her pain is controlled.  Discharge Exam: Vitals:   07/22/17 2053 07/23/17 0609  BP: 109/60 (!) 96/51  Pulse: 90 (!) 103  Resp: 18 18  Temp: 98.4 F (36.9 C) 98.7 F (37.1 C)  SpO2: 99% 93%   Vitals:   07/22/17 0730 07/22/17 1454 07/22/17 2053 07/23/17 0609  BP: 109/67 113/62 109/60 (!) 96/51  Pulse:   90 (!) 103  Resp: 18 18 18 18   Temp: 98.2 F (36.8 C) 98.5 F (36.9 C) 98.4 F (36.9 C) 98.7 F (37.1 C)  TempSrc: Oral Oral Oral Oral  SpO2: 97% 99% 99% 93%  Weight:      Height:        General: Pt is alert, awake, not in acute distress Cardiovascular: RRR, S1/S2 +, no rubs, no gallops Respiratory: CTA bilaterally, no wheezing, no rhonchi Abdominal: Soft, NT, ND, bowel sounds + Extremities: no edema, no cyanosis   The results of significant diagnostics from this hospitalization (including  imaging, microbiology, ancillary and laboratory) are listed below for reference.     Microbiology: Recent Results (from the past 240 hour(s))  Culture, blood (Routine X 2) w Reflex to ID Panel     Status: None (Preliminary result)   Collection Time: 07/19/17 10:25 PM  Result Value Ref Range Status   Specimen Description BLOOD RIGHT ARM  Final   Special Requests   Final    BOTTLES DRAWN AEROBIC AND ANAEROBIC BLOOD RIGHT ARM   Culture NO GROWTH 4 DAYS  Final   Report Status PENDING  Incomplete  Culture, blood (Routine X 2) w Reflex to ID Panel     Status: None (Preliminary result)   Collection Time: 07/19/17 10:36 PM  Result Value Ref Range Status   Specimen Description BLOOD RIGHT ARM  Final   Special Requests   Final    BOTTLES DRAWN AEROBIC AND ANAEROBIC Blood Culture adequate volume  Culture NO GROWTH 4 DAYS  Final   Report Status PENDING  Incomplete  Culture, Urine     Status: Abnormal   Collection Time: 07/20/17  1:32 AM  Result Value Ref Range Status   Specimen Description URINE, CLEAN CATCH  Final   Special Requests Immunocompromised  Final   Culture MULTIPLE SPECIES PRESENT, SUGGEST RECOLLECTION (A)  Final   Report Status 07/21/2017 FINAL  Final  MRSA PCR Screening     Status: None   Collection Time: 07/21/17  9:40 AM  Result Value Ref Range Status   MRSA by PCR NEGATIVE NEGATIVE Final    Comment:        The GeneXpert MRSA Assay (FDA approved for NASAL specimens only), is one component of a comprehensive MRSA colonization surveillance program. It is not intended to diagnose MRSA infection nor to guide or monitor treatment for MRSA infections.      Labs: BNP (last 3 results) No results for input(s): BNP in the last 8760 hours. Basic Metabolic Panel: Recent Labs  Lab 07/19/17 1051 07/19/17 2225 07/20/17 0529  NA 134* 131* 132*  K 3.9 3.9 4.0  CL  --  95* 98*  CO2 27 24 24   GLUCOSE 104 119* 108*  BUN 14.0 19 18  CREATININE 0.8 0.69 0.69  CALCIUM 9.6  9.2 8.8*  MG  --  1.1*  --   PHOS  --  3.1  --    Liver Function Tests: Recent Labs  Lab 07/19/17 1051 07/19/17 2225  AST 30 31  ALT 12 13*  ALKPHOS 64 55  BILITOT 0.42 0.6  PROT 7.0 6.7  ALBUMIN 2.5* 2.5*   Recent Labs  Lab 07/19/17 2225  LIPASE 19   No results for input(s): AMMONIA in the last 168 hours. CBC: Recent Labs  Lab 07/19/17 1051  07/19/17 2225 07/20/17 0529 07/20/17 1602 07/20/17 2100 07/21/17 0630 07/22/17 0417  WBC 10.3  --  9.9 10.8*  --  12.6* 10.9* 12.1*  NEUTROABS 8.3*  --  8.1* 8.0*  --   --  8.7* 9.2*  HGB 6.7*   < > 7.2* 7.4* 8.1* 6.7* 4.9* 9.1*  HCT 21.0*   < > 23.2* 23.5* 25.3* 20.7* 15.9* 27.8*  MCV 73.0*  --  74.6* 77.6*  --  77.8* 78.3 81.3  PLT 444*  --  422* 359  --  338 276 271   < > = values in this interval not displayed.   Cardiac Enzymes: No results for input(s): CKTOTAL, CKMB, CKMBINDEX, TROPONINI in the last 168 hours. BNP: Invalid input(s): POCBNP CBG: No results for input(s): GLUCAP in the last 168 hours. D-Dimer No results for input(s): DDIMER in the last 72 hours. Hgb A1c No results for input(s): HGBA1C in the last 72 hours. Lipid Profile No results for input(s): CHOL, HDL, LDLCALC, TRIG, CHOLHDL, LDLDIRECT in the last 72 hours. Thyroid function studies No results for input(s): TSH, T4TOTAL, T3FREE, THYROIDAB in the last 72 hours.  Invalid input(s): FREET3 Anemia work up No results for input(s): VITAMINB12, FOLATE, FERRITIN, TIBC, IRON, RETICCTPCT in the last 72 hours. Urinalysis    Component Value Date/Time   COLORURINE YELLOW 07/19/2017 2220   APPEARANCEUR CLEAR 07/19/2017 2220   LABSPEC 1.020 07/19/2017 2220   LABSPEC 1.020 11/07/2016 1005   PHURINE 5.0 07/19/2017 2220   GLUCOSEU NEGATIVE 07/19/2017 2220   GLUCOSEU Negative 11/07/2016 1005   HGBUR NEGATIVE 07/19/2017 2220   BILIRUBINUR NEGATIVE 07/19/2017 2220   BILIRUBINUR Negative 11/07/2016 1005  KETONESUR NEGATIVE 07/19/2017 2220   PROTEINUR  NEGATIVE 07/19/2017 2220   UROBILINOGEN 0.2 11/07/2016 1005   NITRITE NEGATIVE 07/19/2017 2220   LEUKOCYTESUR MODERATE (A) 07/19/2017 2220   LEUKOCYTESUR Color Interference 11/07/2016 1005   Sepsis Labs Invalid input(s): PROCALCITONIN,  WBC,  LACTICIDVEN Microbiology Recent Results (from the past 240 hour(s))  Culture, blood (Routine X 2) w Reflex to ID Panel     Status: None (Preliminary result)   Collection Time: 07/19/17 10:25 PM  Result Value Ref Range Status   Specimen Description BLOOD RIGHT ARM  Final   Special Requests   Final    BOTTLES DRAWN AEROBIC AND ANAEROBIC BLOOD RIGHT ARM   Culture NO GROWTH 4 DAYS  Final   Report Status PENDING  Incomplete  Culture, blood (Routine X 2) w Reflex to ID Panel     Status: None (Preliminary result)   Collection Time: 07/19/17 10:36 PM  Result Value Ref Range Status   Specimen Description BLOOD RIGHT ARM  Final   Special Requests   Final    BOTTLES DRAWN AEROBIC AND ANAEROBIC Blood Culture adequate volume   Culture NO GROWTH 4 DAYS  Final   Report Status PENDING  Incomplete  Culture, Urine     Status: Abnormal   Collection Time: 07/20/17  1:32 AM  Result Value Ref Range Status   Specimen Description URINE, CLEAN CATCH  Final   Special Requests Immunocompromised  Final   Culture MULTIPLE SPECIES PRESENT, SUGGEST RECOLLECTION (A)  Final   Report Status 07/21/2017 FINAL  Final  MRSA PCR Screening     Status: None   Collection Time: 07/21/17  9:40 AM  Result Value Ref Range Status   MRSA by PCR NEGATIVE NEGATIVE Final    Comment:        The GeneXpert MRSA Assay (FDA approved for NASAL specimens only), is one component of a comprehensive MRSA colonization surveillance program. It is not intended to diagnose MRSA infection nor to guide or monitor treatment for MRSA infections.     Time coordinating discharge: 37 mins  SIGNED:  Irwin Brakeman, MD  [facility] 07/23/2017, 10:27 AM Pager [phone]  If 7PM-7AM,  please contact night-coverage [email or web address] Password [email or web address]

## 2017-07-23 NOTE — Care Management (Signed)
    Durable Medical Equipment  (From admission, onward)        Start     Ordered   07/23/17 1005  For home use only DME Hospital bed  Once    Question Answer Comment  The above medical condition requires: Patient requires the ability to reposition frequently   Head must be elevated greater than: 30 degrees   Bed type Semi-electric      07/23/17 1004   07/23/17 1004  For home use only DME Walker rolling  Once    Comments:  ROLLATOR  Question:  Patient needs a walker to treat with the following condition  Answer:  Weakness   07/23/17 1004

## 2017-07-23 NOTE — Progress Notes (Signed)
Discharge instructions including medications and follow up appointments were reviewed and discussed with patient. All questions were answered and no further questions at this time. Pt in stable condition and in no acute distress at time of discharge. Pt escorted by nurse tech.

## 2017-07-23 NOTE — Care Management (Addendum)
After discussing disposition with patient, patient states she has talked it over with her family and wishes to NOT accept hospice services.  She is agreeable to [facility] RN.  She would like a hospital bed and rollator. Offered choice of [facility]. She would like [facility] for DME and home health.  CM offered to talk with family, patient declines, stating that she has made up her mind and that family is aware. Daughter will transport patient home.  Attending notified.  Juliann Pulse of [facility] notified will obtain orders from Epic when available. Rollator will be delivered to room prior to DC. Patient is ok with discharging home before hospital bed arrives.   Cassandra of Hospice notified of discontinuing hospice referral.

## 2017-07-24 ENCOUNTER — Emergency Department (HOSPITAL_COMMUNITY)
Admission: EM | Admit: 2017-07-24 | Discharge: 2017-07-24 | Disposition: A | Discharge status: Home / Self Care | Payer: Medicare Other | Attending: Emergency Medicine | Admitting: Emergency Medicine

## 2017-07-24 DIAGNOSIS — Z79899 Other long term (current) drug therapy: Secondary | ICD-10-CM | POA: Diagnosis not present

## 2017-07-24 DIAGNOSIS — Z87891 Personal history of nicotine dependence: Secondary | ICD-10-CM | POA: Diagnosis not present

## 2017-07-24 DIAGNOSIS — Z7189 Other specified counseling: Secondary | ICD-10-CM

## 2017-07-24 DIAGNOSIS — C7800 Secondary malignant neoplasm of unspecified lung: Secondary | ICD-10-CM | POA: Diagnosis not present

## 2017-07-24 DIAGNOSIS — C799 Secondary malignant neoplasm of unspecified site: Secondary | ICD-10-CM

## 2017-07-24 DIAGNOSIS — J449 Chronic obstructive pulmonary disease, unspecified: Secondary | ICD-10-CM | POA: Insufficient documentation

## 2017-07-24 DIAGNOSIS — M549 Dorsalgia, unspecified: Secondary | ICD-10-CM | POA: Diagnosis present

## 2017-07-24 DIAGNOSIS — C50919 Malignant neoplasm of unspecified site of unspecified female breast: Secondary | ICD-10-CM | POA: Insufficient documentation

## 2017-07-24 LAB — CULTURE, BLOOD (ROUTINE X 2)
CULTURE: NO GROWTH
CULTURE: NO GROWTH
SPECIAL REQUESTS: ADEQUATE
Special Requests: ADEQUATE

## 2017-07-24 MED ORDER — GABAPENTIN 300 MG PO CAPS
600.0000 mg | ORAL_CAPSULE | Freq: Once | ORAL | Status: AC
  Administered 2017-07-24: 600 mg via ORAL
  Filled 2017-07-24: qty 2

## 2017-07-24 MED ORDER — HYDROMORPHONE HCL 2 MG PO TABS
4.0000 mg | ORAL_TABLET | Freq: Once | ORAL | Status: AC
  Administered 2017-07-24: 4 mg via ORAL
  Filled 2017-07-24: qty 2

## 2017-07-24 NOTE — Discharge Instructions (Signed)
Take your usual prescriptions as previously directed. The [facility] staff will call you tomorrow morning at 9am to schedule a time to meet with you in your home.  Call your regular medical doctor tomorrow to schedule a follow up appointment within the next 2 days.  Return to the Emergency Department immediately sooner if worsening.

## 2017-07-24 NOTE — ED Notes (Signed)
Attempted to remove dressing from tumor on pt's head.  Dressing is stuck and hardened from drainage.  Soaked in saline and will attempt to remove later.

## 2017-07-24 NOTE — ED Triage Notes (Signed)
Pt reports chronic back pain from metastatic cancer.  States she is here for a shot of Dilaudid.  Was discharged yesterday, but they forgot to send her medications home with her.  Did have some Dilaudid stashed at home and took 4mg  at 0730.

## 2017-07-24 NOTE — ED Provider Notes (Signed)
[facility] EMERGENCY DEPARTMENT Provider Note   CSN: [id number] Arrival date & time: 07/24/17  5638     History   Chief Complaint Chief Complaint  Patient presents with  . Back Pain    HPI Paula [facility] is a 66 y.o. female.   Back Pain      Pt was seen at 0930. Per pt, c/o gradual onset and persistence of constant acute flair of her chronic "pains" since this morning. Pt states she was discharged from the hospital yesterday "and they forgot to give me back my bag of medicine." Pt states she "had a stash of dilaudid" tabs and she took one at 0730 PTA. Pt states she also changed her fentanyl patch. Pt continues to c/o pain and presents to the ED for pain control and to get her meds back. Pt denies any other complaints. Denies any change in her usual chronic pain. Pt has hx metastatic CA, was DNR and comfort care her last admission.   Past Medical History:  Diagnosis Date  . Anemia   . Anxiety   . Atrial septal defect 1996   Surgical repair in 1996  . Breast cancer ([place]) dx'd 06/2013  . Chest pain    Admitted to [facility] in 09/2011; refused stress test  . Chronic bronchitis   . Chronic pain   . COPD (chronic obstructive pulmonary disease) ([place])    on xray  . Endometrial cancer ([place])   . Metastatic cancer to lung ([place]) dx'd 2017  . Palpitation    Tachycardia reported by monitor clerk during a symptomatic spell  . Radiation 06/30/14-08/17/14   Bilateral Breast  . Tobacco abuse    60 pack years; 1.5 packs per day  . Wears dentures    top    Patient Active Problem List   Diagnosis Date Noted  . Rectal bleed 07/21/2017  . SIRS (systemic inflammatory response syndrome) ([place]) 07/20/2017  . Hypomagnesemia 07/20/2017  . Hyponatremia 07/20/2017  . Symptomatic anemia 07/20/2017  . Acute post-operative pain 05/10/2017  . Endometrial cancer ([place]) 12/18/2016  . Protein-calorie malnutrition, severe 11/15/2016  . UTI (urinary tract infection) 11/14/2016  . COPD (chronic  obstructive pulmonary disease) ([place])   . 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 Chemotherapy-induced thrombocytopenia 06/13/2016  . Scalp lesion 04/01/2016  . Encounter for central line care 12/08/2015  . Port catheter in place 11/19/2015  . Insomnia 11/01/2015  . Microcytic anemia 09/28/2015  . Lung metastases ([place]) 09/10/2015  . Bone metastases ([place]) 07/20/2015  . Goals of care, counseling/discussion 07/13/2015  . Metastatic breast cancer ([place]) 06/29/2015  . Chronic pain 03/16/2015  . Vaginal bleeding 09/24/2014  . Postherpetic neuralgia 09/03/2014  . Lymphedema 09/03/2014  . Suspected herpes zoster left C5 distribution 08/14/2014  . 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 Anxiety 03/03/2014  . Bilateral breast cancer ([place]) 08/11/2013  . Palpitations   . Chest pain   . Atrial septal defect   . Laboratory test 11/25/2011  . Chronic bronchitis   . Tobacco abuse     Past Surgical History:  Procedure Laterality Date  . ASD REPAIR, OSTIUM PRIMUM  1996   dr Roxy Horseman  . AXILLARY LYMPH NODE DISSECTION Bilateral 04/08/2014   Procedure:  BILATERAL AXILLARY LYMPH NODE DISSECTION;  Surgeon: Autumn Messing III, MD;  [facility];  Service: General;  Laterality: Bilateral;  . BREAST BIOPSY Bilateral   . BREAST LUMPECTOMY WITH RADIOACTIVE SEED LOCALIZATION Bilateral 04/08/2014   Procedure: BILATERAL  RADIOACTIVE SEED LOCALIZATION LUMPECTOMY ;  Surgeon: Autumn Messing III, MD;  Location: [facility];  Service: General;  Laterality: Bilateral;  . CESAREAN SECTION     x3  . CHOLECYSTECTOMY    . LAPAROTOMY N/A 02/13/2017   Procedure: MINI EXPLORATORY LAPAROTOMY;  Surgeon: Everitt Amber, MD;  Location: [facility];  Service: Gynecology;  Laterality: N/A;  . open heart surgery    . PORT A CATH REVISION  1/15   put in   . ROBOTIC ASSISTED TOTAL HYSTERECTOMY WITH BILATERAL SALPINGO OOPHERECTOMY Bilateral 02/13/2017   Procedure: XI  ROBOTIC ASSISTED TOTAL HYSTERECTOMY WITH BILATERAL SALPINGO OOPHORECTOMY WITH LYSIS OF ADHESIONS, UTERUS GREATER THAN 250GRAMS;  Surgeon: Everitt Amber, MD;  Location: [facility];  Service: Gynecology;  Laterality: Bilateral;  . TUBAL LIGATION      OB History    Gravida Para Term Preterm AB Living   4 3 3          SAB TAB Ectopic Multiple Live Births                   Home Medications    Prior to Admission medications   Medication Sig Start Date End Date Taking? Authorizing Provider  acetaminophen (TYLENOL) 500 MG tablet Take 500 mg by mouth every 6 (six) hours as needed.    [provider]  docusate sodium (COLACE) 100 MG capsule Take 1 capsule (100 mg total) by mouth 2 (two) times daily. Patient taking differently: Take 100 mg by mouth daily.  04/06/17   Gardenia Phlegm, NP  fentaNYL (DURAGESIC - DOSED MCG/HR) 25 MCG/HR patch Place 1 patch (25 mcg total) onto the skin every 3 (three) days. 07/22/17   Johnson, Clanford L, MD  gabapentin (NEURONTIN) 300 MG capsule TAKE 2 CAPSULES (600 MG TOTAL) BY MOUTH 3 (THREE) TIMES DAILY. 05/24/17   Nicholas Lose, MD  HYDROmorphone (DILAUDID) 4 MG tablet Take 1 tablet (4 mg total) by mouth every 4 (four) hours as needed for severe pain. 07/10/17   Nicholas Lose, MD  lactose free nutrition (BOOST) LIQD Take 237 mLs by mouth 3 (three) times daily between meals. 07/23/17 08/22/17  Johnson, Clanford L, MD  LORazepam (ATIVAN) 1 MG tablet TAKE 1 TABLET BY MOUTH EVERYDAY AT BEDTIME Patient taking differently: Take 1 mg by mouth at bedtime.  05/18/17   Nicholas Lose, MD  polyethylene glycol (MIRALAX / GLYCOLAX) packet Take 17 g by mouth 2 (two) times daily. 04/06/17   Gardenia Phlegm, NP  prochlorperazine (COMPAZINE) 10 MG tablet Take 1 tablet (10 mg total) by mouth every 6 (six) hours as needed (Nausea or vomiting). 03/30/17   Gardenia Phlegm, NP  zolpidem (AMBIEN) 5 MG tablet Take 2 tablets (10 mg total) by mouth at bedtime as needed  for up to 5 days for sleep. 07/22/17 07/27/17  Murlean Iba, MD    Family History Family History  Problem Relation Age of Onset  . Breast cancer Maternal Aunt     Social History Social History   Tobacco Use  . Smoking status: Former Smoker    Packs/day: 1.50    Years: 40.00    Pack years: 60.00    Types: Cigarettes    Last attempt to quit: 08/01/2012    Years since quitting: 4.9  . Smokeless tobacco: Never Used  Substance Use Topics  . Alcohol use: No  . Drug use: No     Allergies   Codeine; Morphine and related; and Oxycodone hcl   Review  of Systems Review of Systems  Musculoskeletal: Positive for back pain.  ROS: Statement: All systems negative except as marked or noted in the HPI; Constitutional: Negative for fever and chills. ; ; Eyes: Negative for eye pain, redness and discharge. ; ; ENMT: Negative for ear pain, hoarseness, nasal congestion, sinus pressure and sore throat. ; ; Cardiovascular: Negative for chest pain, palpitations, diaphoresis, dyspnea and peripheral edema. ; ; Respiratory: Negative for cough, wheezing and stridor. ; ; Gastrointestinal: Negative for nausea, vomiting, diarrhea, abdominal pain, blood in stool, hematemesis, jaundice and rectal bleeding. . ; ; Genitourinary: Negative for dysuria, flank pain and hematuria. ; ; Musculoskeletal: +chronic LBP. Negative for neck pain. Negative for swelling and trauma.; ; Skin: Negative for pruritus, rash, abrasions, blisters, bruising and skin lesion.; ; Neuro: Negative for headache, lightheadedness and neck stiffness. Negative for weakness, altered level of consciousness, altered mental status, extremity weakness, paresthesias, involuntary movement, seizure and syncope.        Physical Exam Updated Vital Signs BP 105/67   Pulse 96   Temp 98.4 F (36.9 C) (Oral)   Resp 18   Ht 5\' 2"  (1.575 m)   Wt 49.9 kg (110 lb)   LMP 05/20/2011   SpO2 96%   BMI 20.12 kg/m   Physical Exam 0935: Physical  examination:  Nursing notes reviewed; Vital signs and O2 SAT reviewed;  Constitutional: Well developed, Well nourished, Well hydrated, In no acute distress; Head:  Normocephalic; Eyes: EOMI, PERRL, No scleral icterus; ENMT: Mouth and pharynx normal, Mucous membranes moist; Neck: Supple, Full range of motion; Cardiovascular: Regular rate and rhythm, No gallop; Respiratory: Breath sounds clear & equal bilaterally, No wheezes.  Speaking full sentences with ease, Normal respiratory effort/excursion; Chest:  Movement normal; Abdomen: Soft, Nontender, Nondistended, Normal bowel sounds; Genitourinary: No CVA tenderness; Spine:  No midline CS, TS, LS tenderness. +TTP bilat lumbar paraspinal muscles.;.;; Extremities: Pulses normal, No tenderness, No edema, No calf edema or asymmetry.; Neuro: AA&Ox3, Major CN grossly intact.  Speech clear. No gross focal motor or sensory deficits in extremities.; Skin: Color normal, Warm, Dry.   ED Treatments / Results  Labs (all labs ordered are listed, but only abnormal results are displayed)   EKG  EKG Interpretation None       Radiology   Procedures Procedures (including critical care time)  Medications Ordered in ED Medications  HYDROmorphone (DILAUDID) tablet 4 mg (4 mg Oral Given 07/24/17 1007)  gabapentin (NEURONTIN) capsule 600 mg (600 mg Oral Given 07/24/17 1043)     Initial Impression / Assessment and Plan / ED Course  I have reviewed the triage vital signs and the nursing notes.  Pertinent labs & imaging results that were available during my care of the patient were reviewed by me and considered in my medical decision making (see chart for details).  MDM Reviewed: previous chart, nursing note and vitals    Per hospital Discharge Summary yesterday:  "GI was consulted and ordered a CTA of the abdomen and pelvis that did not show evidence of an aortoenteric fistula although there was enlargement of the largeright sided retroperitoneal soft  tissue mass now measuring 8 cm that was producing significant mass-effect on the duodenum and may be invading the duodenum resulting in bleeding into the duodenal lumen. This large mass is also compressing the IVC as well as the adjacent vascular structures and could be causing vascular invasion. GI believes that endoscopy would not control the source of bleeding (I agree). CT also shows evidence of  progressive metastatic disease with the tumor. I had a long discussion with patient her son and daughter at bedside. Explained findings of CT scan. We have decided to transition to comfort care. We have agreed on DNR status. Her one complaint is significant abdominal and lower back pain. We will increase her IV Dilaudid, placed on oral Roxanol as well as continue her fentanyl patch. Have also added Ativan as needed for anxiety or shortness of breath. Patient's wish is to go home with hospice, discussed at length with patient and daughter. Daughter is okay with this plan and is 100% willing to take care of her mother. Explained to daughter how emotionally draining and traumatic it may be to see her mother essentially "bleed to death". Although she seems adequately tearful about it she is adamant that she will take care of her mother. I have placed a call to case management to see about arranging home hospice care.   Pt changed her mind and decided to decline hospice services.  She decided to go home with home health care.  We tried multiple times to speak with patient and explain to her the benefits of hospice and that she again declined.  She will be discharged home with [facility] RN.  Check CBC in 3 days.  She was strongly advised to follow up and discuss this with her oncologist and PCP.  She verbalized understanding.  She is likely going to need frequent recurrent blood transfusions before ultimately bleeding to death.  She is at high risk of bleeding to death.  This was explained to patient and family on  multiple occasions and they have decided to take her home."     0955:  T/C to [facility]. Wynetta Emery, case discussed, including:  D/C summary below, meds rx, etc. He agreed that pt Hospice Care was the most appropriate for pt, but she changed her mind right before hospital discharge, pt already has fentanyl patches and dilaudid PO per previous recent prescriptions. This was confirmed in [place] Controlled Substance Database.   1000:  Pt requesting "the medicines they were giving me in the hospital for pain." ED RN and I had a very long d/w pt regarding above. Pt is now willing to have Hospice come to her home for evaluation. She states to [place], "I just want to be comfortable and happy during the time I have left," and "live at my home." [facility] called: on call RN states they will call pt tomorrow at 9am to schedule a time to come to her home tomorrow for evaluation, meds, etc. Pt informed and continues agreeable.   1100:   Pt given her meds while in the ED. Pt is smiling and comfortable with her Hospice decision and that they are going to call tomorrow and then see her. Pt's daughter was called by ED RN per pt's request. I believe no further testing is needed in the ED at this time. Will d/c pt home with her home meds. Pt is thankful to ED RN and I for our time talking with her.      Final Clinical Impressions(s) / ED Diagnoses   Final diagnoses:  None    ED Discharge Orders    None        Francine Graven, DO 07/25/17 1927

## 2017-07-24 NOTE — ED Notes (Signed)
Called answering service for [facility].  On call will contact ED.

## 2017-07-27 ENCOUNTER — Ambulatory Visit: Payer: Medicare Other | Admitting: Hematology and Oncology

## 2017-07-27 ENCOUNTER — Other Ambulatory Visit: Payer: Medicare Other

## 2017-07-30 ENCOUNTER — Other Ambulatory Visit: Payer: Self-pay

## 2017-07-30 DIAGNOSIS — C50911 Malignant neoplasm of unspecified site of right female breast: Secondary | ICD-10-CM

## 2017-07-30 DIAGNOSIS — C50919 Malignant neoplasm of unspecified site of unspecified female breast: Secondary | ICD-10-CM

## 2017-07-30 DIAGNOSIS — C770 Secondary and unspecified malignant neoplasm of lymph nodes of head, face and neck: Secondary | ICD-10-CM

## 2017-07-30 DIAGNOSIS — C50912 Malignant neoplasm of unspecified site of left female breast: Secondary | ICD-10-CM

## 2017-07-30 DIAGNOSIS — Z170 Estrogen receptor positive status [ER+]: Secondary | ICD-10-CM

## 2017-07-30 MED ORDER — PROCHLORPERAZINE MALEATE 10 MG PO TABS: 10.0000 mg | ORAL_TABLET | Freq: Four times a day (QID) | ORAL | 1 refills | Status: DC | PRN

## 2017-07-30 MED ORDER — ONDANSETRON HCL 8 MG PO TABS: 8.0000 mg | ORAL_TABLET | Freq: Two times a day (BID) | ORAL | 0 refills | Status: DC | PRN

## 2017-08-01 NOTE — Assessment & Plan Note (Signed)
Bilateral breast cancers are usually diagnosed 2015 treated with surgery, radiation, anastrozole Metastatic breast cancer diagnosed 05/28/2015 as subpectoral mass, ER 5%, PR 0%, HER-2 negative, failed Xeloda and carboplatin and gemcitabine  CT CAP04/17/2017:RP and retrocrural lymphadenopathy, large uterine fibroids with necrosis;  MRI Abd: RP lymphadenopathy CT chest 11/20/16: Worsening bulky conglomeration of mediastinal and right hilar lymph nodes, lung nodules subcentimeter size slight increase, left breast mass was 3.7 x 5.8 cm now 5.4 x 7.4 cm CT scan on 11/12 indicates progression.    Treatment: Olaparib started 06/26/2017 1.  Leg swelling: No DVT 2. severe anemia: blood transfusion given  Patient has been slowly getting worse and we had a lengthy discussion and recommended hospice care. 

## 2017-08-02 ENCOUNTER — Inpatient Hospital Stay: Payer: Medicare Other | Attending: Hematology and Oncology | Admitting: Hematology and Oncology

## 2017-08-02 ENCOUNTER — Observation Stay (HOSPITAL_COMMUNITY)
Admission: EM | Admit: 2017-08-02 | Discharge: 2017-08-03 | Disposition: A | Discharge status: Home / Self Care | Payer: Medicare Other | Attending: Family Medicine | Admitting: Family Medicine

## 2017-08-02 ENCOUNTER — Emergency Department (HOSPITAL_COMMUNITY): Payer: Medicare Other

## 2017-08-02 ENCOUNTER — Encounter (HOSPITAL_COMMUNITY): Payer: Self-pay

## 2017-08-02 DIAGNOSIS — C7951 Secondary malignant neoplasm of bone: Secondary | ICD-10-CM | POA: Diagnosis not present

## 2017-08-02 DIAGNOSIS — C50911 Malignant neoplasm of unspecified site of right female breast: Secondary | ICD-10-CM | POA: Diagnosis present

## 2017-08-02 DIAGNOSIS — J449 Chronic obstructive pulmonary disease, unspecified: Secondary | ICD-10-CM | POA: Diagnosis not present

## 2017-08-02 DIAGNOSIS — Z79899 Other long term (current) drug therapy: Secondary | ICD-10-CM | POA: Diagnosis not present

## 2017-08-02 DIAGNOSIS — R1030 Lower abdominal pain, unspecified: Secondary | ICD-10-CM

## 2017-08-02 DIAGNOSIS — C7800 Secondary malignant neoplasm of unspecified lung: Secondary | ICD-10-CM | POA: Diagnosis not present

## 2017-08-02 DIAGNOSIS — C50011 Malignant neoplasm of nipple and areola, right female breast: Secondary | ICD-10-CM | POA: Diagnosis not present

## 2017-08-02 DIAGNOSIS — Z87891 Personal history of nicotine dependence: Secondary | ICD-10-CM | POA: Insufficient documentation

## 2017-08-02 DIAGNOSIS — D509 Iron deficiency anemia, unspecified: Secondary | ICD-10-CM | POA: Diagnosis present

## 2017-08-02 DIAGNOSIS — C50012 Malignant neoplasm of nipple and areola, left female breast: Secondary | ICD-10-CM

## 2017-08-02 DIAGNOSIS — D649 Anemia, unspecified: Secondary | ICD-10-CM | POA: Diagnosis not present

## 2017-08-02 DIAGNOSIS — Z8542 Personal history of malignant neoplasm of other parts of uterus: Secondary | ICD-10-CM | POA: Insufficient documentation

## 2017-08-02 DIAGNOSIS — R52 Pain, unspecified: Secondary | ICD-10-CM | POA: Insufficient documentation

## 2017-08-02 DIAGNOSIS — C541 Malignant neoplasm of endometrium: Secondary | ICD-10-CM | POA: Diagnosis present

## 2017-08-02 DIAGNOSIS — C50919 Malignant neoplasm of unspecified site of unspecified female breast: Secondary | ICD-10-CM

## 2017-08-02 DIAGNOSIS — Z853 Personal history of malignant neoplasm of breast: Secondary | ICD-10-CM | POA: Diagnosis not present

## 2017-08-02 DIAGNOSIS — M545 Low back pain: Secondary | ICD-10-CM | POA: Diagnosis not present

## 2017-08-02 DIAGNOSIS — Z66 Do not resuscitate: Secondary | ICD-10-CM

## 2017-08-02 DIAGNOSIS — C7801 Secondary malignant neoplasm of right lung: Secondary | ICD-10-CM

## 2017-08-02 DIAGNOSIS — C786 Secondary malignant neoplasm of retroperitoneum and peritoneum: Secondary | ICD-10-CM | POA: Diagnosis not present

## 2017-08-02 DIAGNOSIS — C50912 Malignant neoplasm of unspecified site of left female breast: Secondary | ICD-10-CM

## 2017-08-02 DIAGNOSIS — C799 Secondary malignant neoplasm of unspecified site: Secondary | ICD-10-CM

## 2017-08-02 DIAGNOSIS — R531 Weakness: Secondary | ICD-10-CM | POA: Diagnosis present

## 2017-08-02 LAB — COMPREHENSIVE METABOLIC PANEL
ALBUMIN: 2.5 g/dL — AB (ref 3.5–5.0)
ALK PHOS: 60 U/L (ref 38–126)
ALT: 20 U/L (ref 14–54)
ANION GAP: 9 (ref 5–15)
AST: 45 U/L — AB (ref 15–41)
BILIRUBIN TOTAL: 0.7 mg/dL (ref 0.3–1.2)
BUN: 20 mg/dL (ref 6–20)
CO2: 27 mmol/L (ref 22–32)
Calcium: 8.9 mg/dL (ref 8.9–10.3)
Chloride: 96 mmol/L — ABNORMAL LOW (ref 101–111)
Creatinine, Ser: 0.53 mg/dL (ref 0.44–1.00)
GFR calc Af Amer: >60 mL/min (ref >60)
GFR calc non Af Amer: >60 mL/min (ref >60)
GLUCOSE: 108 mg/dL — AB (ref 65–99)
POTASSIUM: 3.8 mmol/L (ref 3.5–5.1)
SODIUM: 132 mmol/L — AB (ref 135–145)
Total Protein: 6.4 g/dL — ABNORMAL LOW (ref 6.5–8.1)

## 2017-08-02 LAB — CBC WITH DIFFERENTIAL/PLATELET
BASOS ABS: 0 10*3/uL (ref 0.0–0.1)
BASOS PCT: 0 %
EOS ABS: 0 10*3/uL (ref 0.0–0.7)
Eosinophils Relative: 0 %
HEMATOCRIT: 17.7 % — AB (ref 36.0–46.0)
HEMOGLOBIN: 5.6 g/dL — AB (ref 12.0–15.0)
Lymphocytes Relative: 9 %
Lymphs Abs: 1.1 10*3/uL (ref 0.7–4.0)
MCH: 24.6 pg — ABNORMAL LOW (ref 26.0–34.0)
MCHC: 31.6 g/dL (ref 30.0–36.0)
MCV: 77.6 fL — ABNORMAL LOW (ref 78.0–100.0)
MONOS PCT: 7 %
Monocytes Absolute: 0.9 10*3/uL (ref 0.1–1.0)
NEUTROS ABS: 10.8 10*3/uL — AB (ref 1.7–7.7)
NEUTROS PCT: 84 %
Platelets: 495 10*3/uL — ABNORMAL HIGH (ref 150–400)
RBC: 2.28 MIL/uL — ABNORMAL LOW (ref 3.87–5.11)
RDW: 18.8 % — ABNORMAL HIGH (ref 11.5–15.5)
WBC: 12.9 10*3/uL — ABNORMAL HIGH (ref 4.0–10.5)

## 2017-08-02 LAB — PREPARE RBC (CROSSMATCH)

## 2017-08-02 MED ORDER — SODIUM CHLORIDE 0.9 % IV SOLN
250.0000 mL | Freq: Once | INTRAVENOUS | Status: AC
  Administered 2017-08-02: 250 mL via INTRAVENOUS

## 2017-08-02 MED ORDER — ONDANSETRON HCL 4 MG/2ML IJ SOLN
4.0000 mg | Freq: Once | INTRAMUSCULAR | Status: AC
  Administered 2017-08-02: 4 mg via INTRAVENOUS
  Filled 2017-08-02: qty 2

## 2017-08-02 MED ORDER — LORAZEPAM 1 MG PO TABS: 1.0000 mg | ORAL_TABLET | Freq: Every day | ORAL | Status: DC

## 2017-08-02 MED ORDER — HEPARIN SOD (PORK) LOCK FLUSH 100 UNIT/ML IV SOLN: 250.0000 [IU] | INTRAVENOUS | Status: DC | PRN

## 2017-08-02 MED ORDER — DOCUSATE SODIUM 100 MG PO CAPS
100.0000 mg | ORAL_CAPSULE | Freq: Two times a day (BID) | ORAL | Status: DC
  Administered 2017-08-02 – 2017-08-03 (×2): 100 mg via ORAL
  Filled 2017-08-02 (×2): qty 1

## 2017-08-02 MED ORDER — SODIUM CHLORIDE 0.9 % IV SOLN: 250.0000 mL | Freq: Once | INTRAVENOUS | Status: AC

## 2017-08-02 MED ORDER — ZOLPIDEM TARTRATE 5 MG PO TABS
5.0000 mg | ORAL_TABLET | Freq: Once | ORAL | Status: AC
  Administered 2017-08-02: 5 mg via ORAL
  Filled 2017-08-02: qty 1

## 2017-08-02 MED ORDER — ONDANSETRON HCL 4 MG PO TABS: 8.0000 mg | ORAL_TABLET | Freq: Two times a day (BID) | ORAL | Status: DC | PRN

## 2017-08-02 MED ORDER — PHENOL 1.4 % MT LIQD
1.0000 | OROMUCOSAL | Status: DC | PRN
  Administered 2017-08-02: 1 via OROMUCOSAL
  Filled 2017-08-02: qty 177

## 2017-08-02 MED ORDER — ACETAMINOPHEN 650 MG RE SUPP: 650.0000 mg | Freq: Four times a day (QID) | RECTAL | Status: DC | PRN

## 2017-08-02 MED ORDER — GABAPENTIN 300 MG PO CAPS
300.0000 mg | ORAL_CAPSULE | Freq: Three times a day (TID) | ORAL | Status: DC
  Administered 2017-08-02 – 2017-08-03 (×3): 300 mg via ORAL
  Filled 2017-08-02 (×3): qty 1

## 2017-08-02 MED ORDER — ONDANSETRON HCL 4 MG PO TABS
4.0000 mg | ORAL_TABLET | Freq: Four times a day (QID) | ORAL | Status: DC | PRN
  Administered 2017-08-03: 4 mg via ORAL
  Filled 2017-08-02: qty 1

## 2017-08-02 MED ORDER — SODIUM CHLORIDE 0.9% FLUSH: 3.0000 mL | Freq: Two times a day (BID) | INTRAVENOUS | Status: DC

## 2017-08-02 MED ORDER — SODIUM CHLORIDE 0.9% FLUSH: 10.0000 mL | INTRAVENOUS | Status: DC | PRN

## 2017-08-02 MED ORDER — BOOST PO LIQD
237.0000 mL | Freq: Three times a day (TID) | ORAL | Status: DC
  Administered 2017-08-02: 237 mL via ORAL
  Filled 2017-08-02 (×4): qty 237

## 2017-08-02 MED ORDER — ZOLPIDEM TARTRATE 10 MG PO TABS: 10.0000 mg | ORAL_TABLET | Freq: Every evening | ORAL | Status: DC | PRN

## 2017-08-02 MED ORDER — ACETAMINOPHEN 500 MG PO TABS: 500.0000 mg | ORAL_TABLET | Freq: Four times a day (QID) | ORAL | Status: DC | PRN

## 2017-08-02 MED ORDER — SODIUM CHLORIDE 0.9% FLUSH: 3.0000 mL | INTRAVENOUS | Status: DC | PRN

## 2017-08-02 MED ORDER — DIPHENHYDRAMINE HCL 25 MG PO CAPS
25.0000 mg | ORAL_CAPSULE | Freq: Once | ORAL | Status: AC
  Administered 2017-08-02: 25 mg via ORAL
  Filled 2017-08-02: qty 1

## 2017-08-02 MED ORDER — ACETAMINOPHEN 325 MG PO TABS
650.0000 mg | ORAL_TABLET | Freq: Once | ORAL | Status: AC
  Filled 2017-08-02: qty 2

## 2017-08-02 MED ORDER — TRAZODONE HCL 50 MG PO TABS: 50.0000 mg | ORAL_TABLET | Freq: Every evening | ORAL | Status: DC | PRN

## 2017-08-02 MED ORDER — HEPARIN SOD (PORK) LOCK FLUSH 100 UNIT/ML IV SOLN
500.0000 [IU] | Freq: Every day | INTRAVENOUS | Status: DC | PRN
  Filled 2017-08-02: qty 5

## 2017-08-02 MED ORDER — HEPARIN SOD (PORK) LOCK FLUSH 100 UNIT/ML IV SOLN: 500.0000 [IU] | Freq: Every day | INTRAVENOUS | Status: DC | PRN

## 2017-08-02 MED ORDER — PROCHLORPERAZINE MALEATE 10 MG PO TABS: 10.0000 mg | ORAL_TABLET | Freq: Four times a day (QID) | ORAL | Status: DC | PRN

## 2017-08-02 MED ORDER — SENNA 8.6 MG PO TABS
1.0000 | ORAL_TABLET | Freq: Two times a day (BID) | ORAL | Status: DC
  Administered 2017-08-02: 8.6 mg via ORAL
  Filled 2017-08-02 (×2): qty 1

## 2017-08-02 MED ORDER — DIPHENHYDRAMINE HCL 50 MG/ML IJ SOLN
12.5000 mg | Freq: Once | INTRAMUSCULAR | Status: AC
  Administered 2017-08-02: 12.5 mg via INTRAVENOUS
  Filled 2017-08-02: qty 1

## 2017-08-02 MED ORDER — ACETAMINOPHEN 325 MG PO TABS
650.0000 mg | ORAL_TABLET | Freq: Once | ORAL | Status: AC
  Administered 2017-08-02: 650 mg via ORAL
  Filled 2017-08-02: qty 2

## 2017-08-02 MED ORDER — ACETAMINOPHEN 325 MG PO TABS
650.0000 mg | ORAL_TABLET | Freq: Four times a day (QID) | ORAL | Status: DC | PRN
  Administered 2017-08-03 (×2): 650 mg via ORAL
  Filled 2017-08-02 (×2): qty 2

## 2017-08-02 MED ORDER — POLYETHYLENE GLYCOL 3350 17 G PO PACK
17.0000 g | PACK | Freq: Two times a day (BID) | ORAL | Status: DC
  Administered 2017-08-03: 17 g via ORAL
  Filled 2017-08-02 (×3): qty 1

## 2017-08-02 MED ORDER — FENTANYL 25 MCG/HR TD PT72: 25.0000 ug | MEDICATED_PATCH | TRANSDERMAL | Status: DC

## 2017-08-02 MED ORDER — HEPARIN SOD (PORK) LOCK FLUSH 100 UNIT/ML IV SOLN
500.0000 [IU] | Freq: Every day | INTRAVENOUS | Status: AC | PRN
  Administered 2017-08-03: 500 [IU]

## 2017-08-02 MED ORDER — ONDANSETRON HCL 4 MG/2ML IJ SOLN: 4.0000 mg | Freq: Four times a day (QID) | INTRAMUSCULAR | Status: DC | PRN

## 2017-08-02 MED ORDER — DEXTROSE-NACL 5-0.45 % IV SOLN
INTRAVENOUS | Status: DC
  Administered 2017-08-02 (×2): via INTRAVENOUS

## 2017-08-02 MED ORDER — SODIUM CHLORIDE 0.9 % IV SOLN: 10.0000 mL/h | Freq: Once | INTRAVENOUS | Status: DC

## 2017-08-02 MED ORDER — HYDROMORPHONE HCL 1 MG/ML IJ SOLN
1.0000 mg | INTRAMUSCULAR | Status: DC | PRN
  Administered 2017-08-02 – 2017-08-03 (×6): 1 mg via INTRAVENOUS
  Filled 2017-08-02 (×6): qty 1

## 2017-08-02 MED ORDER — SODIUM CHLORIDE 0.9 % IV BOLUS (SEPSIS)
1000.0000 mL | Freq: Once | INTRAVENOUS | Status: AC
  Administered 2017-08-02: 1000 mL via INTRAVENOUS

## 2017-08-02 MED ORDER — HYDROMORPHONE HCL 1 MG/ML IJ SOLN
1.0000 mg | INTRAMUSCULAR | Status: AC | PRN
  Administered 2017-08-02 (×2): 1 mg via INTRAVENOUS
  Filled 2017-08-02 (×2): qty 1

## 2017-08-02 MED ORDER — POLYETHYLENE GLYCOL 3350 17 G PO PACK: 17.0000 g | PACK | Freq: Every day | ORAL | Status: DC | PRN

## 2017-08-02 MED ORDER — ALBUTEROL SULFATE (2.5 MG/3ML) 0.083% IN NEBU: 2.5000 mg | INHALATION_SOLUTION | RESPIRATORY_TRACT | Status: DC | PRN

## 2017-08-02 MED ORDER — SODIUM CHLORIDE 0.9 % IV SOLN: 250.0000 mL | INTRAVENOUS | Status: DC | PRN

## 2017-08-02 MED ORDER — DIPHENHYDRAMINE HCL 25 MG PO CAPS
25.0000 mg | ORAL_CAPSULE | Freq: Once | ORAL | Status: AC
  Filled 2017-08-02: qty 1

## 2017-08-02 NOTE — Progress Notes (Signed)
Patient Care Team: [name], MD as PCP - General (Internal Medicine) Rothbart, Cristopher Estimable, MD (Cardiology) [name], MD as Consulting Physician (Gastroenterology) Delice Bison, Charlestine Massed, NP as Nurse Practitioner (Hematology and Oncology) Nicholas Lose, MD as Consulting Physician (Hematology and Oncology) Kyung Rudd, MD as Consulting Physician (Radiation Oncology)  DIAGNOSIS:  Encounter Diagnosis  Name Primary?  . 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 N1 M0 stage IIB ER 100%, PR 0%, HER-2       06/17/2014 -  Radiation Therapy    Adjuvant radiation therapy      09/14/2014 -  Anti-estrogen oral therapy    Anastrozole 1 mg daily      05/28/2015 Imaging    CT scans: Enlarging  subpectoral masses 3.1 x 3.5 cm, posteriorly lower density mass 4.5 x 2.1 cm, several right-sided lung nodules right lower lobe 1.4 cm, 3 other right lung nodules, 1.7 cm right iliac bone lesion      06/21/2015 Procedure    Left subpectoral mass biopsy: Invasive high-grade ductal carcinoma ER 5%, PR 0%, HER-2 negative ratio 1.29      09/01/2015 Imaging    Left chest wall mass increased in size 7.2 x 5.1 cm, multiple subcutaneous nodules, increase in the lung nodules both in number as well as in the size of existing nodules      09/10/2015 -  Chemotherapy    Carboplatin, gemcitabine days 1 and 8 q 3 weeks, carboplatin discontinued for neuropathy (treatment break from 01/20/2016 to 03/24/2016); Added Carboplatin back 04/14/16 (for progression)      01/26/2016 Imaging    Marked improvement in size of lung nodules with many of the nodules resolved index right lower lobe nodule 1.9 x 1.8 cm is now 0.7 x 0.6 cm, reduction in the size of left breast mass 7.2 cm down to 2.6 cm, enlargement in the hypodense mass in the uterus      04/06/2016 Imaging    CT chest: Interval progression of pre-existing lung nodules new lung metastases (39m, 186m 2.9 cm, 1.6 cm), interval progression of disease in the left breast 4.2 cm (was 2.6 cm), additional nodules 2.3 cm and 3.2  cm; CT head: scalp mass 1.9 cm      06/27/2016 Imaging    Ct chest: Stable lesions in lt breast deep to Left pectoralis, cystic lesion loc ant in left breast inc compared to previous; decrease in lung nodules (59m to 3 mm; 12 mm to 7 mm, RML nodule 2.9 cm to 1.8 cm, RLL 128mto 7 mm)      11/14/2016 Imaging    CT CAP:RP and retrocrural lymphadenopathy, large uterine fibroids with necrosis; MRI Abd: RP lymphadenopathy CT chest 11/20/16: Worsening bulky conglomeration of mediastinal and right hilar lymph nodes, lung nodules subcentimeter size slight increase, left breast mass was 3.7 x 5.8 cm now 5.4 x 7.4 cm      12/08/2016 - 04/06/2017  Chemotherapy    Palliative chemotherapy with [place] days 1 and 8 every 3 weeks stopped due to peripheral neuropathy       04/30/2017 - 05/18/2017 Radiation Therapy    Palliative radiation therapy to the scalp and the left chest wall lesion      06/11/2017 Imaging    Progression noted with new and progressing pulmonary nodules, increasing size of periaortic mass, increasing iliac adenopathy with mass lesion difficult to separate from adjacent psoas muscle, new 1563module in the LUQ anterior to the colon.        06/25/2017 Miscellaneous    Olaparib (patient is BRCA mutation positive)       Endometrial cancer ([facility] 02/13/2017 Surgery    Hysterectomy with BSO: HG Serous carcinoma 7.7 cm (>50% Myometrial inv)involving Uterus and Bilateral ovaries, Omentum: No carcinoma; T3a (FIGO stage IIIa)       CHIEF COMPLIANT: Follow-up to discuss overall treatment plan  INTERVAL HISTORY: Paula Navarro a 66 73ar old with above-mentioned history of metastatic breast cancer and prior endometrial cancer who had a BRCA mutation positivity.  She had progressively gotten weaker and reached a point where hospice was being given considered.  Her family were also in agreement.   She came into the office in a wheelchair stooped over looking extremely frail and weak.  Her blood pressure was 80/50.  Previously she has been severely anemic and had required blood transfusion.  Patient feels that she needs another transfusion.  She has been having GI bleeding as well.  She looks fairly ill and sick to the point that we feel that she needs to come in and get admitted to the hospital.  Unfortunately we are not able to give her the supportive care that she needs as an outpatient. I spoke to her extensively and she is DNR as well as willing to accept hospice at this time. She complains of intense low back pain and is also requesting pain medication for that.  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  I have reviewed the past medical history, past surgical history, social history and family history with the patient and they are unchanged from previous note.  ALLERGIES:  is allergic to codeine; morphine  and related; and oxycodone hcl.  MEDICATIONS:  Current Outpatient Medications  Medication Sig Dispense Refill  . acetaminophen (TYLENOL) 500 MG tablet Take 500 mg by mouth every 6 (six) hours as needed.    . docusate sodium (COLACE) 100 MG capsule Take 1 capsule (100 mg total) by mouth 2 (two) times daily. (Patient taking differently: Take 100 mg by mouth daily. ) 10 capsule 0  . fentaNYL (DURAGESIC - DOSED MCG/HR) 25 MCG/HR patch Place 1 patch (25 mcg total) onto the skin every 3 (three) days. 5 patch 0  . gabapentin (NEURONTIN) 300 MG capsule TAKE 2 CAPSULES (600 MG TOTAL) BY MOUTH 3 (THREE) TIMES DAILY. 180 capsule 2  . HYDROmorphone (DILAUDID) 4 MG tablet Take 1 tablet (4 mg total) by mouth every 4 (four) hours as needed for severe pain. 120 tablet 0  . lactose free nutrition (BOOST) LIQD Take 237 mLs by mouth 3 (three) times daily between meals. 21330 mL 0  . LORazepam (ATIVAN) 1 MG tablet TAKE 1 TABLET BY MOUTH EVERYDAY AT BEDTIME (Patient taking differently: Take 1 mg by mouth at bedtime. ) 30 tablet 2  . ondansetron (ZOFRAN) 8 MG tablet Take 1 tablet (8 mg total) by mouth  2 (two) times daily as needed for nausea or vomiting. 60 tablet 0  . polyethylene glycol (MIRALAX / GLYCOLAX) packet Take 17 g by mouth 2 (two) times daily. 14 each 0  . prochlorperazine (COMPAZINE) 10 MG tablet Take 1 tablet (10 mg total) by mouth every 6 (six) hours as needed (Nausea or vomiting). 30 tablet 1  . zolpidem (AMBIEN) 5 MG tablet Take 2 tablets (10 mg total) by mouth at bedtime as needed for up to 5 days for sleep. 5 tablet 0   No current facility-administered medications for this visit.     PHYSICAL EXAMINATION: ECOG PERFORMANCE STATUS: 1 - Symptomatic but completely ambulatory  Vitals:   08/02/17 1419  BP: (!) 84/53  Pulse: (!) 115  Resp: 16  Temp: 99.1 F (37.3 C)  SpO2: 100%   Filed Weights    GENERAL: Generalized weakness and frailty SKIN: Frail lady EYES: Pallor OROPHARYNX:no exudate, no erythema and lips, buccal mucosa, and tongue normal  LUNGS: clear to auscultation and percussion with normal breathing effort, large tumor on the left chest wall HEART: Tachycardia ABDOMEN:abdomen soft, non-tender and normal bowel sounds MUSCULOSKELETAL:no cyanosis of digits and no clubbing  NEURO: Patient answers questions appropriately and has a conversation that she has been stooped over EXTREMITIES: No lower extremity edema  LABORATORY DATA:  I have reviewed the data as listed   Chemistry      Component Value Date/Time   NA 132 (L) 07/20/2017 0529   NA 134 (L) 07/19/2017 1051   K 4.0 07/20/2017 0529   K 3.9 07/19/2017 1051   CL 98 (L) 07/20/2017 0529   CO2 24 07/20/2017 0529   CO2 27 07/19/2017 1051   BUN 18 07/20/2017 0529   BUN 14.0 07/19/2017 1051   CREATININE 0.69 07/20/2017 0529   CREATININE 0.8 07/19/2017 1051      Component Value Date/Time   CALCIUM 8.8 (L) 07/20/2017 0529   CALCIUM 9.6 07/19/2017 1051   ALKPHOS 55 07/19/2017 2225   ALKPHOS 64 07/19/2017 1051   AST 31 07/19/2017 2225   AST 30 07/19/2017 1051   ALT 13 (L) 07/19/2017 2225   ALT  12 07/19/2017 1051   BILITOT 0.6 07/19/2017 2225   BILITOT 0.42 07/19/2017 1051  Lab Results  Component Value Date   WBC 12.1 (H) 07/22/2017   HGB 9.1 (L) 07/22/2017   HCT 27.8 (L) 07/22/2017   MCV 81.3 07/22/2017   PLT 271 07/22/2017   NEUTROABS 9.2 (H) 07/22/2017    ASSESSMENT & PLAN:  Bilateral breast cancer ([place]) and uterine cancer Bilateral breast cancers are usually diagnosed 2015 treated with surgery, radiation, anastrozole Metastatic breast cancer diagnosed 05/28/2015 as subpectoral mass, ER 5%, PR 0%, HER-2 negative, failed Xeloda and carboplatin and gemcitabine  CT CAP04/17/2017:RP and retrocrural lymphadenopathy, large uterine fibroids with necrosis;  MRI Abd: RP lymphadenopathy CT chest 11/20/16: Worsening bulky conglomeration of mediastinal and right hilar lymph nodes, lung nodules subcentimeter size slight increase, left breast mass was 3.7 x 5.8 cm now 5.4 x 7.4 cm CT scan on 11/12 indicates progression.    I discussed with the patient that I do not believe that she can tolerate any further systemic therapy for breast cancer. I recommended hospice care. She is willing to accept hospice care.  She would like to feel slightly better before she can go home with hospice. She most likely has severe anemia and will most likely require blood transfusion.  Since we are not able to accommodate her, I decided to send her to the emergency room. I spoke to the emergency room physician and alerted them regarding her issues.  Severe low back pain: Patient is on multiple pain medications.  It is very hard to understand what she is taking.  She usually calls [place] asking for refills even though we have given her these pain medications relatively recently.  Uterine cancer: Carcinosarcoma very aggressive disease.  Patient will need to go home with hospice after she gets stabilized.  I spent 45 minutes talking to the patient of which more than half was spent in counseling and  coordination of care.  Orders Placed This Encounter  Procedures  . CBC with Differential    Standing Status:   Future    Standing Expiration Date:   08/02/2018  . Comprehensive metabolic panel    Standing Status:   Future    Standing Expiration Date:   08/02/2018  . Hold Tube, Blood Bank    Standing Status:   Future    Standing Expiration Date:   08/02/2018  . Type and screen    Standing Status:   Future    Standing Expiration Date:   08/02/2018   The patient has a good understanding of the overall plan. she agrees with it. she will call with any problems that may develop before the next visit here.   Harriette Ohara, MD 08/02/17

## 2017-08-02 NOTE — ED Provider Notes (Signed)
[facility] DEPT Provider Note   CSN: [id number] Arrival date & time: 08/02/17  1451     History   Chief Complaint Chief Complaint  Patient presents with  . Cancer    Brought over from cancer center for weakness and pain.    HPI Paula Navarro is a 67 y.o. female.  HPI Pt has a history of breast ca and endometrial ca with metastatic disease to her lungs, bone and retroperitoneum.  Pt unfortunately has not responded to treatment.  She was seen by her oncologist today who recommended hospice care.  Pt continues to have severe pain associated with her cancer not responding to med management.  Pt was sent to the ED for pain management and further assessment of her weakness.  Pt has had recurrent issues with anemia requiring transfusion.  Pt denies any blood in her stool.  No fever.  Pt states her pain is severe and is requesting  dilaudid or stronger. Past Medical History:  Diagnosis Date  . Anemia   . Anxiety   . Atrial septal defect 1996   Surgical repair in 1996  . Breast cancer ([place]) dx'd 06/2013  . Chest pain    Admitted to [facility] in 09/2011; refused stress test  . Chronic bronchitis   . Chronic pain   . COPD (chronic obstructive pulmonary disease) ([place])    on xray  . Endometrial cancer ([place])   . Metastatic cancer to lung ([place]) dx'd 2017  . Palpitation    Tachycardia reported by monitor clerk during a symptomatic spell  . Radiation 06/30/14-08/17/14   Bilateral Breast  . Tobacco abuse    60 pack years; 1.5 packs per day  . Wears dentures    top    Patient Active Problem List   Diagnosis Date Noted  . Rectal bleed 07/21/2017  . SIRS (systemic inflammatory response syndrome) ([place]) 07/20/2017  . Hypomagnesemia 07/20/2017  . Hyponatremia 07/20/2017  . Symptomatic anemia 07/20/2017  . Acute post-operative pain 05/10/2017  . Endometrial cancer ([place]) 12/18/2016  . Protein-calorie malnutrition, severe 11/15/2016  . UTI (urinary tract infection)  11/14/2016  . COPD (chronic obstructive pulmonary disease) ([place])   . 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 Chemotherapy-induced thrombocytopenia 06/13/2016  . Scalp lesion 04/01/2016  . Encounter for central line care 12/08/2015  . Port catheter in place 11/19/2015  . Insomnia 11/01/2015  . Microcytic anemia 09/28/2015  . Lung metastases ([place]) 09/10/2015  . Bone metastases ([place]) 07/20/2015  . Goals of care, counseling/discussion 07/13/2015  . Metastatic breast cancer ([place]) 06/29/2015  . Chronic pain 03/16/2015  . Vaginal bleeding 09/24/2014  . Postherpetic neuralgia 09/03/2014  . Lymphedema 09/03/2014  . Suspected herpes zoster left C5 distribution 08/14/2014  . 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 Anxiety 03/03/2014  . Bilateral breast cancer ([place]) 08/11/2013  . Palpitations   . Chest pain   . Atrial septal defect   . Laboratory test 11/25/2011  . Chronic bronchitis   . Tobacco abuse     Past Surgical History:  Procedure Laterality Date  . ASD REPAIR, OSTIUM PRIMUM  1996   dr Roxy Horseman  . AXILLARY LYMPH NODE DISSECTION Bilateral 04/08/2014   Procedure:  BILATERAL AXILLARY LYMPH NODE DISSECTION;  Surgeon: Autumn Messing III, MD;  Location: [facility];  Service: General;  Laterality: Bilateral;  . BREAST BIOPSY Bilateral   . BREAST LUMPECTOMY WITH RADIOACTIVE SEED LOCALIZATION Bilateral 04/08/2014   Procedure: BILATERAL  RADIOACTIVE SEED  LOCALIZATION LUMPECTOMY ;  Surgeon: Autumn Messing III, MD;  Location: [facility];  Service: General;  Laterality: Bilateral;  . CESAREAN SECTION     x3  . CHOLECYSTECTOMY    . LAPAROTOMY N/A 02/13/2017   Procedure: MINI EXPLORATORY LAPAROTOMY;  Surgeon: Everitt Amber, MD;  Location: [facility];  Service: Gynecology;  Laterality: N/A;  . open heart surgery    . PORT A CATH REVISION  1/15   put in   . ROBOTIC ASSISTED TOTAL HYSTERECTOMY WITH BILATERAL SALPINGO OOPHERECTOMY Bilateral  02/13/2017   Procedure: XI ROBOTIC ASSISTED TOTAL HYSTERECTOMY WITH BILATERAL SALPINGO OOPHORECTOMY WITH LYSIS OF ADHESIONS, UTERUS GREATER THAN 250GRAMS;  Surgeon: Everitt Amber, MD;  Location: [facility];  Service: Gynecology;  Laterality: Bilateral;  . TUBAL LIGATION      OB History    Gravida Para Term Preterm AB Living   4 3 3          SAB TAB Ectopic Multiple Live Births                   Home Medications    Prior to Admission medications   Medication Sig Start Date End Date Taking? Authorizing Provider  acetaminophen (TYLENOL) 500 MG tablet Take 500 mg by mouth every 6 (six) hours as needed.    [provider]  docusate sodium (COLACE) 100 MG capsule Take 1 capsule (100 mg total) by mouth 2 (two) times daily. Patient taking differently: Take 100 mg by mouth daily.  04/06/17   Gardenia Phlegm, NP  fentaNYL (DURAGESIC - DOSED MCG/HR) 25 MCG/HR patch Place 1 patch (25 mcg total) onto the skin every 3 (three) days. 07/22/17   Johnson, Clanford L, MD  gabapentin (NEURONTIN) 300 MG capsule TAKE 2 CAPSULES (600 MG TOTAL) BY MOUTH 3 (THREE) TIMES DAILY. 05/24/17   Nicholas Lose, MD  HYDROmorphone (DILAUDID) 4 MG tablet Take 1 tablet (4 mg total) by mouth every 4 (four) hours as needed for severe pain. 07/10/17   Nicholas Lose, MD  lactose free nutrition (BOOST) LIQD Take 237 mLs by mouth 3 (three) times daily between meals. 07/23/17 08/22/17  Johnson, Clanford L, MD  LORazepam (ATIVAN) 1 MG tablet TAKE 1 TABLET BY MOUTH EVERYDAY AT BEDTIME Patient taking differently: Take 1 mg by mouth at bedtime.  05/18/17   Nicholas Lose, MD  ondansetron (ZOFRAN) 8 MG tablet Take 1 tablet (8 mg total) by mouth 2 (two) times daily as needed for nausea or vomiting. 07/30/17   Nicholas Lose, MD  polyethylene glycol (MIRALAX / GLYCOLAX) packet Take 17 g by mouth 2 (two) times daily. 04/06/17   Gardenia Phlegm, NP  prochlorperazine (COMPAZINE) 10 MG tablet Take 1 tablet (10 mg total) by mouth  every 6 (six) hours as needed (Nausea or vomiting). 07/30/17   Nicholas Lose, MD  zolpidem (AMBIEN) 5 MG tablet Take 2 tablets (10 mg total) by mouth at bedtime as needed for up to 5 days for sleep. 07/22/17 07/27/17  Murlean Iba, MD    Family History Family History  Problem Relation Age of Onset  . Breast cancer Maternal Aunt     Social History Social History   Tobacco Use  . Smoking status: Former Smoker    Packs/day: 1.50    Years: 40.00    Pack years: 60.00    Types: Cigarettes    Last attempt to quit: 08/01/2012    Years since quitting: 5.0  . Smokeless tobacco: Never Used  Substance Use Topics  .  Alcohol use: No  . Drug use: No     Allergies   Codeine; Morphine and related; and Oxycodone hcl   Review of Systems Review of Systems  All other systems reviewed and are negative.    Physical Exam Updated Vital Signs BP 100/61 (BP Location: Right Arm)   Pulse (!) 105   Temp 99.3 F (37.4 C) (Oral)   Resp 17   LMP 05/20/2011   SpO2 98%   Physical Exam  Constitutional: She appears distressed.  Thin, underweight  HENT:  Head: Normocephalic and atraumatic.  Right Ear: External ear normal.  Left Ear: External ear normal.  Eyes: Conjunctivae are normal. Right eye exhibits no discharge. Left eye exhibits no discharge. No scleral icterus.  Neck: Neck supple. No tracheal deviation present.  Cardiovascular: Normal rate, regular rhythm and intact distal pulses.  Pulmonary/Chest: Effort normal and breath sounds normal. No stridor. No respiratory distress. She has no wheezes. She has no rales.  Abdominal: Soft. Bowel sounds are normal. She exhibits no distension. There is tenderness. There is no rebound and no guarding.  Musculoskeletal: She exhibits no edema or tenderness.  No rash on the back , no swelling or erythema, no ttp spine  Neurological: She is alert. No cranial nerve deficit (no facial droop, extraocular movements intact, no slurred speech) or sensory  deficit. She exhibits normal muscle tone. She displays no seizure activity. Coordination normal.  Generalized weakness  Skin: Skin is warm and dry. No rash noted. She is not diaphoretic.  Psychiatric: She has a normal mood and affect.  Nursing note and vitals reviewed.    ED Treatments / Results  Labs (all labs ordered are listed, but only abnormal results are displayed) Labs Reviewed  CBC WITH DIFFERENTIAL/PLATELET - Abnormal; Notable for the following components:      Result Value   WBC 12.9 (*)    RBC 2.28 (*)    Hemoglobin 5.6 (*)    HCT 17.7 (*)    MCV 77.6 (*)    MCH 24.6 (*)    RDW 18.8 (*)    Platelets 495 (*)    Neutro Abs 10.8 (*)    All other components within normal limits  COMPREHENSIVE METABOLIC PANEL - Abnormal; Notable for the following components:   Sodium 132 (*)    Chloride 96 (*)    Glucose, Bld 108 (*)    Total Protein 6.4 (*)    Albumin 2.5 (*)    AST 45 (*)    All other components within normal limits  TYPE AND SCREEN  PREPARE RBC (CROSSMATCH)      Radiology Dg Chest Portable 1 View  Result Date: 08/02/2017 CLINICAL DATA:  Weakness.  Metastatic cancer.  Heart racing. EXAM: PORTABLE CHEST 1 VIEW COMPARISON:  07/20/2015. FINDINGS: Surgical clips noted over both chest. Port catheter noted with tip over the superior vena cava. Heart size normal. Right hilar and infrahilar fullness noted suggesting adenopathy. Pulmonary densities are noted in the right lung suggesting metastatic disease. Old left rib fractures are identified. Left mastectomy. IMPRESSION: 1. PowerPort catheter noted with tip over superior vena cava. 2. Right hilar and infrahilar fullness suggesting adenopathy. Right pulmonary densities noted suggesting metastatic disease. CT can be obtained for further evaluation is needed. 3. Prior median sternotomy. 4. Left mastectomy.  Surgical clips noted over both axilla. Electronically Signed   By: Marcello Moores  Register   On: 08/02/2017 16:39     Procedures .Critical Care Performed by: Dorie Rank, MD Authorized by: Dorie Rank,  MD   Critical care provider statement:    Critical care time (minutes):  30   Critical care was time spent personally by me on the following activities:  Discussions with consultants, evaluation of patient's response to treatment, examination of patient, ordering and performing treatments and interventions, ordering and review of laboratory studies, ordering and review of radiographic studies, pulse oximetry, re-evaluation of patient's condition, obtaining history from patient or surrogate and review of old charts   (including critical care time)  Medications Ordered in ED Medications  0.9 %  sodium chloride infusion (not administered)  sodium chloride 0.9 % bolus 1,000 mL (1,000 mLs Intravenous New Bag/Given 08/02/17 1617)  HYDROmorphone (DILAUDID) injection 1 mg (1 mg Intravenous Given 08/02/17 1713)  diphenhydrAMINE (BENADRYL) injection 12.5 mg (12.5 mg Intravenous Given 08/02/17 1614)  ondansetron (ZOFRAN) injection 4 mg (4 mg Intravenous Given 08/02/17 1614)     Initial Impression / Assessment and Plan / ED Course  I have reviewed the triage vital signs and the nursing notes.  Pertinent labs & imaging results that were available during my care of the patient were reviewed by me and considered in my medical decision making (see chart for details).    Patient presents to the emergency room for evaluation of weakness and persistent pain.  Patient has a history of metastatic cancer.  Her oncologist recommends hospice care.  She was sent to the emergency room for pain management as well as medical stabilization.  He felt she would likely require a transfusion.  Her laboratory tests do show she has recurrent anemia.  I have ordered blood transfusions.  I will consult with the medical service for admission.  Final Clinical Impressions(s) / ED Diagnoses   Final diagnoses:  Anemia, unspecified type  Metastatic  cancer [facility])      Dorie Rank, MD 08/02/17 [id number]

## 2017-08-02 NOTE — ED Notes (Signed)
ED TO INPATIENT HANDOFF REPORT  Name/Age/Gender Paula Navarro 67 y.o. female  Code Status    Code Status Orders  (From admission, onward)        Start     Ordered   08/02/17 1808  Full code  Continuous     08/02/17 1807    Code Status History    Date Active Date Inactive Code Status Order ID Comments User Context   07/21/2017 14:53 07/23/2017 17:01 DNR [id number]  Erline Hau, MD Inpatient   07/20/2017 02:53 07/21/2017 14:53 Full Code [id number]  Reubin Milan, MD Inpatient   02/13/2017 15:02 02/14/2017 21:09 Full Code [id number]  Lahoma Crocker, MD Inpatient   11/14/2016 03:30 11/16/2016 19:05 Full Code [id number]  Phillips Grout, MD Inpatient   01/01/2015 10:50 01/02/2015 03:20 Full Code [id number]  Aletta Edouard, MD [facility]   04/08/2014 13:48 04/09/2014 11:58 Full Code [id number]  Jovita Kussmaul, MD Inpatient    Advance Directive Documentation     Most Recent Value  Type of Advance Directive  Healthcare Power of Attorney, Living will  Pre-existing out of facility DNR order (yellow form or pink MOST form)  No data  "MOST" Form in Place?  No data      Home/SNF/Other Home  Chief Complaint cancer pt  Level of Care/Admitting Diagnosis ED Disposition    ED Disposition Condition Comment   Admit  Hospital Area: [facility] [[id number]]  Level of Care: Med-Surg [16]  Diagnosis: Symptomatic anemia [[id number]]  Admitting Physician: Morrison Old  Attending Physician: Morrison Old  Estimated length of stay: 3 - 4 days  Certification:: I certify this patient will need inpatient services for at least 2 midnights  Bed request comments: medsurg  PT Class (Do Not Modify): Inpatient [101]  PT Acc Code (Do Not Modify): Private [1]       Medical History Past Medical History:  Diagnosis Date  . Anemia   . Anxiety   . Atrial septal defect 1996   Surgical repair in 1996  . Breast cancer ([place]) dx'd 06/2013  . Chest pain     Admitted to [facility] in 09/2011; refused stress test  . Chronic bronchitis   . Chronic pain   . COPD (chronic obstructive pulmonary disease) ([place])    on xray  . Endometrial cancer ([place])   . Metastatic cancer to lung ([place]) dx'd 2017  . Palpitation    Tachycardia reported by monitor clerk during a symptomatic spell  . Radiation 06/30/14-08/17/14   Bilateral Breast  . Tobacco abuse    60 pack years; 1.5 packs per day  . Wears dentures    top    Allergies Allergies  Allergen Reactions  . Codeine Other (See Comments)    Hot in chest  . Morphine And Related Itching and Rash  . Oxycodone Hcl Rash    Red rash    IV Location/Drains/Wounds Patient Lines/Drains/Airways Status   Active Line/Drains/Airways    Name:   Placement date:   Placement time:   Site:   Days:   Implanted Port 09/09/15 Right Chest   09/09/15    0935    Chest   693   Implanted Port 08/02/17 Right Chest   08/02/17    1541    Chest   less than 1   Incision (Closed) 02/13/17 Abdomen   02/13/17    1222     170   Incision (Closed) 02/13/17 Vagina   02/13/17    1222  170   Incision - 5 Ports Abdomen Right;Lateral Upper;Umbilicus Left;Lateral;Upper Left;Lateral;Mid Left;Lateral;Lower   02/13/17    1100     170          Labs/Imaging Results for orders placed or performed during the hospital encounter of 08/02/17 (from the past 48 hour(s))  CBC with Differential     Status: Abnormal   Collection Time: 08/02/17  3:19 PM  Result Value Ref Range   WBC 12.9 (H) 4.0 - 10.5 K/uL   RBC 2.28 (L) 3.87 - 5.11 MIL/uL   Hemoglobin 5.6 (LL) 12.0 - 15.0 g/dL    Comment: REPEATED TO VERIFY CRITICAL RESULT CALLED TO, READ BACK BY AND VERIFIED WITH: KATIE RAND,RN [id number] @ 1654 BY J SCOTTON    HCT 17.7 (L) 36.0 - 46.0 %   MCV 77.6 (L) 78.0 - 100.0 fL   MCH 24.6 (L) 26.0 - 34.0 pg   MCHC 31.6 30.0 - 36.0 g/dL   RDW 18.8 (H) 11.5 - 15.5 %   Platelets 495 (H) 150 - 400 K/uL   Neutrophils Relative % 84 %   Neutro Abs 10.8 (H) 1.7 -  7.7 K/uL   Lymphocytes Relative 9 %   Lymphs Abs 1.1 0.7 - 4.0 K/uL   Monocytes Relative 7 %   Monocytes Absolute 0.9 0.1 - 1.0 K/uL   Eosinophils Relative 0 %   Eosinophils Absolute 0.0 0.0 - 0.7 K/uL   Basophils Relative 0 %   Basophils Absolute 0.0 0.0 - 0.1 K/uL  Comprehensive metabolic panel     Status: Abnormal   Collection Time: 08/02/17  3:19 PM  Result Value Ref Range   Sodium 132 (L) 135 - 145 mmol/L   Potassium 3.8 3.5 - 5.1 mmol/L   Chloride 96 (L) 101 - 111 mmol/L   CO2 27 22 - 32 mmol/L   Glucose, Bld 108 (H) 65 - 99 mg/dL   BUN 20 6 - 20 mg/dL   Creatinine, Ser 0.53 0.44 - 1.00 mg/dL   Calcium 8.9 8.9 - 10.3 mg/dL   Total Protein 6.4 (L) 6.5 - 8.1 g/dL   Albumin 2.5 (L) 3.5 - 5.0 g/dL   AST 45 (H) 15 - 41 U/L   ALT 20 14 - 54 U/L   Alkaline Phosphatase 60 38 - 126 U/L   Total Bilirubin 0.7 0.3 - 1.2 mg/dL   GFR calc non Af Amer >60 >60 mL/min   GFR calc Af Amer >60 >60 mL/min    Comment: (NOTE) The eGFR has been calculated using the CKD EPI equation. This calculation has not been validated in all clinical situations. eGFR's persistently <60 mL/min signify possible Chronic Kidney Disease.    Anion gap 9 5 - 15  Type and screen [place]     Status: None (Preliminary result)   Collection Time: 08/02/17  4:38 PM  Result Value Ref Range   ABO/RH(D) AB POS    Antibody Screen NEG    Sample Expiration 08/05/2017    Unit Number [id number]    Blood Component Type RED CELLS,LR    Unit division 00    Status of Unit ALLOCATED    Transfusion Status OK TO TRANSFUSE    Crossmatch Result COMPATIBLE   Prepare RBC     Status: None   Collection Time: 08/02/17  4:38 PM  Result Value Ref Range   Order Confirmation ORDER PROCESSED BY BLOOD BANK    *Note: Due to a large number of results and/or encounters for  the requested time period, some results have not been displayed. A complete set of results can be found in Results Review.   Dg Chest  Portable 1 View  Result Date: 08/02/2017 CLINICAL DATA:  Weakness.  Metastatic cancer.  Heart racing. EXAM: PORTABLE CHEST 1 VIEW COMPARISON:  07/20/2015. FINDINGS: Surgical clips noted over both chest. Port catheter noted with tip over the superior vena cava. Heart size normal. Right hilar and infrahilar fullness noted suggesting adenopathy. Pulmonary densities are noted in the right lung suggesting metastatic disease. Old left rib fractures are identified. Left mastectomy. IMPRESSION: 1. PowerPort catheter noted with tip over superior vena cava. 2. Right hilar and infrahilar fullness suggesting adenopathy. Right pulmonary densities noted suggesting metastatic disease. CT can be obtained for further evaluation is needed. 3. Prior median sternotomy. 4. Left mastectomy.  Surgical clips noted over both axilla. Electronically Signed   By: Marcello Moores  Register   On: 08/02/2017 16:39    Pending Labs Unresulted Labs (From admission, onward)   Start     Ordered   08/03/17 0500  CBC  Tomorrow morning,   R    Question:  Specimen collection method  Answer:  Lab=Lab collect   08/02/17 1807   08/03/17 6270  Basic metabolic panel  Tomorrow morning,   R    Question:  Specimen collection method  Answer:  Lab=Lab collect   08/02/17 1831   08/02/17 1808  HIV antibody (Routine Testing)  Once,   R     08/02/17 1807      Vitals/Pain Today's Vitals   08/02/17 1750 08/02/17 1830 08/02/17 1930 08/02/17 1939  BP:  106/72 105/66 105/66  Pulse:  (!) 109 (!) 106 (!) 106  Resp:    16  Temp:      TempSrc:      SpO2:  98% 97% 96%  PainSc: 2        Isolation Precautions No active isolations  Medications Medications  0.9 %  sodium chloride infusion (not administered)  polyethylene glycol (MIRALAX / GLYCOLAX) packet 17 g (not administered)  docusate sodium (COLACE) capsule 100 mg (not administered)  LORazepam (ATIVAN) tablet 1 mg (not administered)  gabapentin (NEURONTIN) capsule 300 mg (not administered)   fentaNYL (DURAGESIC - dosed mcg/hr) patch 25 mcg (not administered)  lactose free nutrition (Boost) liquid 237 mL (not administered)  HYDROmorphone (DILAUDID) injection 1 mg (not administered)  sodium chloride flush (NS) 0.9 % injection 3 mL (not administered)  sodium chloride flush (NS) 0.9 % injection 3 mL (not administered)  0.9 %  sodium chloride infusion (not administered)  acetaminophen (TYLENOL) tablet 650 mg (not administered)    Or  acetaminophen (TYLENOL) suppository 650 mg (not administered)  traZODone (DESYREL) tablet 50 mg (not administered)  senna (SENOKOT) tablet 8.6 mg (not administered)  ondansetron (ZOFRAN) tablet 4 mg (not administered)    Or  ondansetron (ZOFRAN) injection 4 mg (not administered)  albuterol (PROVENTIL) (2.5 MG/3ML) 0.083% nebulizer solution 2.5 mg (not administered)  dextrose 5 %-0.45 % sodium chloride infusion ( Intravenous New Bag/Given 08/02/17 1853)  sodium chloride 0.9 % bolus 1,000 mL (0 mLs Intravenous Stopped 08/02/17 1838)  HYDROmorphone (DILAUDID) injection 1 mg (1 mg Intravenous Given 08/02/17 1713)  diphenhydrAMINE (BENADRYL) injection 12.5 mg (12.5 mg Intravenous Given 08/02/17 1614)  ondansetron (ZOFRAN) injection 4 mg (4 mg Intravenous Given 08/02/17 1614)    Mobility walks with person assist

## 2017-08-02 NOTE — ED Notes (Addendum)
Called lab to check on type and screen. Lab said it is almost done. Lab also said they did not have an order to prepare the blood. Double checked with Tomi Bamberger, MD and he pulled up the prepared orders.

## 2017-08-02 NOTE — ED Notes (Addendum)
Attempted to call report on 2 occassions 1935 and 1940 nurse unable to take at those times.  [place] RN request number to return call.

## 2017-08-02 NOTE — H&P (Signed)
Patient Demographics:    Paula Navarro, is a 67 y.o. female  MRN: [id number]   DOB - Mar 18, 1951  Admit Date - 08/02/2017  Outpatient Primary MD for the patient is [name], MD   Assessment & Plan:    Principal Problem:   Symptomatic anemia Active Problems:   Bilateral breast cancer ([place])   Metastatic breast cancer ([facility])   Microcytic anemia   COPD (chronic obstructive pulmonary disease) ([facility])   Endometrial cancer ([place])    1)Symptomatic Anemia-acute on chronic anemia, suspect due to underlying malignancy and prior radiation and chemotherapy , transfused 2 units of packed cells, reevaluate posttransfusion, no evidence of ongoing bleeding or acute blood loss at this time,   2)Social/Ethics- Patient has history of endometrial cancer, and then developed bilateral breast cancer, patient has failed radiation therapy, hormone therapy and chemotherapy, at this time patient is interested in hospice and palliative care services.  CODE STATUS and goals of care to be addressed by palliative care team at this time patient is considering DNR status patient remains a full code  3)Chronic Pain Syndrome-in the setting of underlying metastatic breast cancer, continue Duragesic patch, IV Dilaudid for breakthrough pain   With History of - Reviewed by me  Past Medical History:  Diagnosis Date  . Anemia   . Anxiety   . Atrial septal defect 1996   Surgical repair in 1996  . Breast cancer ([place]) dx'd 06/2013  . Chest pain    Admitted to [facility] in 09/2011; refused stress test  . Chronic bronchitis   . Chronic pain   . COPD (chronic obstructive pulmonary disease) ([place])    on xray  . Endometrial cancer ([place])   . Metastatic cancer to lung ([place]) dx'd 2017  . Palpitation    Tachycardia reported by monitor clerk during a symptomatic  spell  . Radiation 06/30/14-08/17/14   Bilateral Breast  . Tobacco abuse    60 pack years; 1.5 packs per day  . Wears dentures    top      Past Surgical History:  Procedure Laterality Date  . ASD REPAIR, OSTIUM PRIMUM  1996   dr Roxy Horseman  . AXILLARY LYMPH NODE DISSECTION Bilateral 04/08/2014   Procedure:  BILATERAL AXILLARY LYMPH NODE DISSECTION;  Surgeon: Autumn Messing III, MD;  Location: [facility];  Service: General;  Laterality: Bilateral;  . BREAST BIOPSY Bilateral   . BREAST LUMPECTOMY WITH RADIOACTIVE SEED LOCALIZATION Bilateral 04/08/2014   Procedure: BILATERAL  RADIOACTIVE SEED LOCALIZATION LUMPECTOMY ;  Surgeon: Autumn Messing III, MD;  Location: [facility];  Service: General;  Laterality: Bilateral;  . CESAREAN SECTION     x3  . CHOLECYSTECTOMY    . LAPAROTOMY N/A 02/13/2017   Procedure: MINI EXPLORATORY LAPAROTOMY;  Surgeon: Everitt Amber, MD;  Location: [facility];  Service: Gynecology;  Laterality: N/A;  . open heart surgery    . PORT A CATH REVISION  1/15   put  in   . ROBOTIC ASSISTED TOTAL HYSTERECTOMY WITH BILATERAL SALPINGO OOPHERECTOMY Bilateral 02/13/2017   Procedure: XI ROBOTIC ASSISTED TOTAL HYSTERECTOMY WITH BILATERAL SALPINGO OOPHORECTOMY WITH LYSIS OF ADHESIONS, UTERUS GREATER THAN 250GRAMS;  Surgeon: Everitt Amber, MD;  Location: [facility];  Service: Gynecology;  Laterality: Bilateral;  . TUBAL LIGATION        Chief Complaint  Patient presents with  . Cancer    Brought over from cancer center for weakness and pain.      HPI:    Paula Navarro  is a 67 y.o. female  with medical history significant of microcytic anemia, anxiety, history of atrial septal defect and repair in 1996, metastatic breast cancer, COPD/chronic bronchitis, prior h/o endometrial cancer and chronic pain syndrome who presents from oncologist office with complaints of fatigue, malaise, and found to have a hemoglobin of 5.6 with low MCV and MCH.   In ED.... Patient complains of  increasing fatigue and dyspnea on exertion, as well as increasing pain, additional history obtained from patient's niece at bedside, patient denies any change in his stool color or consistency, no epistaxis.  Evidence of ongoing blood loss, patient has had multiple transfusions in the past, posttransfusion hemoglobin was 9.1 about 11 days  Patient has history of endometrial cancer, and then developed bilateral breast cancer, patient has failed radiation therapy, hormone therapy and chemotherapy  At This time patient is interested in hospice and palliative care services  WBC is elevated-suspect reactive leukocytosis in the setting of underlying malignancy  ED provider ordered transfusion of 2 units of packed cells for symptomatic anemia    Review of systems:    In addition to the HPI above,   A full 12 point Review of 10 Systems was done, except as stated above, all other Review of 10 Systems were negative.    Social History:  Reviewed by me    Social History   Tobacco Use  . Smoking status: Former Smoker    Packs/day: 1.50    Years: 40.00    Pack years: 60.00    Types: Cigarettes    Last attempt to quit: 08/01/2012    Years since quitting: 5.0  . Smokeless tobacco: Never Used  Substance Use Topics  . Alcohol use: No     Family History :  Reviewed by me    Family History  Problem Relation Age of Onset  . Breast cancer Maternal Aunt     Home Medications:   Prior to Admission medications   Medication Sig Start Date End Date Taking? Authorizing Provider  docusate sodium (COLACE) 100 MG capsule Take 1 capsule (100 mg total) by mouth 2 (two) times daily. Patient taking differently: Take 100 mg by mouth daily.  04/06/17  Yes Causey, Charlestine Massed, NP  fentaNYL (DURAGESIC - DOSED MCG/HR) 25 MCG/HR patch Place 1 patch (25 mcg total) onto the skin every 3 (three) days. 07/22/17  Yes Johnson, Clanford L, MD  gabapentin (NEURONTIN) 300 MG capsule TAKE 2 CAPSULES (600 MG TOTAL)  BY MOUTH 3 (THREE) TIMES DAILY. 05/24/17  Yes Nicholas Lose, MD  HYDROmorphone (DILAUDID) 4 MG tablet Take 1 tablet (4 mg total) by mouth every 4 (four) hours as needed for severe pain. 07/10/17  Yes Nicholas Lose, MD  lactose free nutrition (BOOST) LIQD Take 237 mLs by mouth 3 (three) times daily between meals. 07/23/17 08/22/17 Yes Johnson, Clanford L, MD  LORazepam (ATIVAN) 1 MG tablet TAKE 1 TABLET BY MOUTH EVERYDAY AT BEDTIME Patient taking differently: Take 1  mg by mouth at bedtime.  05/18/17  Yes Nicholas Lose, MD  acetaminophen (TYLENOL) 500 MG tablet Take 500 mg by mouth every 6 (six) hours as needed.    [provider]  ondansetron (ZOFRAN) 8 MG tablet Take 1 tablet (8 mg total) by mouth 2 (two) times daily as needed for nausea or vomiting. 07/30/17   Nicholas Lose, MD  polyethylene glycol (MIRALAX / GLYCOLAX) packet Take 17 g by mouth 2 (two) times daily. 04/06/17   Gardenia Phlegm, NP  prochlorperazine (COMPAZINE) 10 MG tablet Take 1 tablet (10 mg total) by mouth every 6 (six) hours as needed (Nausea or vomiting). 07/30/17   Nicholas Lose, MD  zolpidem (AMBIEN) 5 MG tablet Take 2 tablets (10 mg total) by mouth at bedtime as needed for up to 5 days for sleep. 07/22/17 07/27/17  Murlean Iba, MD     Allergies:     Allergies  Allergen Reactions  . Codeine Other (See Comments)    Hot in chest  . Morphine And Related Itching and Rash  . Oxycodone Hcl Rash    Red rash     Physical Exam:   Vitals  Blood pressure 109/74, pulse (!) 102, temperature 99.1 F (37.3 C), temperature source Oral, resp. rate 17, last menstrual period 05/20/2011, SpO2 100 %.  Physical Examination: General appearance - alert, cachectic appearing, and in no distress  Mental status - alert, oriented to person, place, and time,  Head-metastatic lesion to her scalp Eyes - sclera anicteric Neck - supple, no JVD elevation , Chest - clear  to auscultation bilaterally, symmetrical air  movement,  Breasts-breast with rather large Mass Heart - S1 and S2 normal, Rt chest Port-A-Cath site is clean dry and intact Abdomen - soft, nontender, nondistended, no masses or organomegaly Neurological - screening mental status exam normal, neck supple without rigidity, cranial nerves II through XII intact, DTR's normal and symmetric Extremities - no pedal edema noted, intact peripheral pulses  Skin - warm, dry Psych- AAO x 3 Neuro-Generalized weakness without new focal deficits   Data Review:    CBC Recent Labs  Lab 08/02/17 1519  WBC 12.9*  HGB 5.6*  HCT 17.7*  PLT 495*  MCV 77.6*  MCH 24.6*  MCHC 31.6  RDW 18.8*  LYMPHSABS 1.1  MONOABS 0.9  EOSABS 0.0  BASOSABS 0.0   ------------------------------------------------------------------------------------------------------------------  Chemistries  Recent Labs  Lab 08/02/17 1519  NA 132*  K 3.8  CL 96*  CO2 27  GLUCOSE 108*  BUN 20  CREATININE 0.53  CALCIUM 8.9  AST 45*  ALT 20  ALKPHOS 60  BILITOT 0.7   ------------------------------------------------------------------------------------------------------------------ estimated creatinine clearance is 56 mL/min (by C-G formula based on SCr of 0.53 mg/dL). ------------------------------------------------------------------------------------------------------------------ No results for input(s): TSH, T4TOTAL, T3FREE, THYROIDAB in the last 72 hours.  Invalid input(s): FREET3   Coagulation profile No results for input(s): INR, PROTIME in the last 168 hours. ------------------------------------------------------------------------------------------------------------------- No results for input(s): DDIMER in the last 72 hours. -------------------------------------------------------------------------------------------------------------------  Cardiac Enzymes No results for input(s): CKMB, TROPONINI, MYOGLOBIN in the last 168 hours.  Invalid input(s):  CK ------------------------------------------------------------------------------------------------------------------ No results found for: BNP   ---------------------------------------------------------------------------------------------------------------  Urinalysis    Component Value Date/Time   COLORURINE YELLOW 07/19/2017 [place] 07/19/2017 2220   LABSPEC 1.020 07/19/2017 2220   LABSPEC 1.020 11/07/2016 1005   PHURINE 5.0 07/19/2017 2220   GLUCOSEU NEGATIVE 07/19/2017 2220   GLUCOSEU Negative 11/07/2016 [place] 07/19/2017 2220  BILIRUBINUR NEGATIVE 07/19/2017 2220   BILIRUBINUR Negative 11/07/2016 1005   KETONESUR NEGATIVE 07/19/2017 2220   PROTEINUR NEGATIVE 07/19/2017 2220   UROBILINOGEN 0.2 11/07/2016 1005   NITRITE NEGATIVE 07/19/2017 2220   LEUKOCYTESUR MODERATE (A) 07/19/2017 2220   LEUKOCYTESUR Color Interference 11/07/2016 1005    ----------------------------------------------------------------------------------------------------------------   Imaging Results:    Dg Chest Portable 1 View  Result Date: 08/02/2017 CLINICAL DATA:  Weakness.  Metastatic cancer.  Heart racing. EXAM: PORTABLE CHEST 1 VIEW COMPARISON:  07/20/2015. FINDINGS: Surgical clips noted over both chest. Port catheter noted with tip over the superior vena cava. Heart size normal. Right hilar and infrahilar fullness noted suggesting adenopathy. Pulmonary densities are noted in the right lung suggesting metastatic disease. Old left rib fractures are identified. Left mastectomy. IMPRESSION: 1. PowerPort catheter noted with tip over superior vena cava. 2. Right hilar and infrahilar fullness suggesting adenopathy. Right pulmonary densities noted suggesting metastatic disease. CT can be obtained for further evaluation is needed. 3. Prior median sternotomy. 4. Left mastectomy.  Surgical clips noted over both axilla. Electronically Signed   By: Marcello Moores  Register   On: 08/02/2017  16:39    Radiological Exams on Admission: Dg Chest Portable 1 View  Result Date: 08/02/2017 CLINICAL DATA:  Weakness.  Metastatic cancer.  Heart racing. EXAM: PORTABLE CHEST 1 VIEW COMPARISON:  07/20/2015. FINDINGS: Surgical clips noted over both chest. Port catheter noted with tip over the superior vena cava. Heart size normal. Right hilar and infrahilar fullness noted suggesting adenopathy. Pulmonary densities are noted in the right lung suggesting metastatic disease. Old left rib fractures are identified. Left mastectomy. IMPRESSION: 1. PowerPort catheter noted with tip over superior vena cava. 2. Right hilar and infrahilar fullness suggesting adenopathy. Right pulmonary densities noted suggesting metastatic disease. CT can be obtained for further evaluation is needed. 3. Prior median sternotomy. 4. Left mastectomy.  Surgical clips noted over both axilla. Electronically Signed   By: Marcello Moores  Register   On: 08/02/2017 16:39    DVT Prophylaxis -SCD  (no heparin due to symptomatic anemia requiring transfusion) AM Labs Ordered, also please review Full Orders  Family Communication: Admission, patients condition and plan of care including tests being ordered have been discussed with the patient and niece who indicate understanding and agree with the plan   Code Status - Full Code  Likely DC to  Home with Hospice  Condition   stable  Roxan Hockey M.D on 08/02/2017 at 6:34 PM   Between 7am to 7pm - Pager - [phone] After 7pm go to [email or web address] - password [email or web address]  [facility] - Office  [phone]  Voice Recognition Viviann Spare dictation system was used to create this note, attempts have been made to correct errors. Please contact the author with questions and/or clarifications.

## 2017-08-02 NOTE — ED Notes (Signed)
Bed: CB83 Expected date:  Expected time:  Means of arrival:  Comments: Cancer center pt

## 2017-08-02 NOTE — ED Triage Notes (Signed)
Patient sent over from cancer center for pain and weakness. Patient c/o of lower back pain.

## 2017-08-03 ENCOUNTER — Other Ambulatory Visit: Payer: Self-pay

## 2017-08-03 ENCOUNTER — Encounter: Payer: Self-pay | Admitting: General Practice

## 2017-08-03 DIAGNOSIS — R5383 Other fatigue: Secondary | ICD-10-CM | POA: Diagnosis not present

## 2017-08-03 DIAGNOSIS — C50919 Malignant neoplasm of unspecified site of unspecified female breast: Secondary | ICD-10-CM

## 2017-08-03 DIAGNOSIS — D649 Anemia, unspecified: Secondary | ICD-10-CM | POA: Diagnosis present

## 2017-08-03 DIAGNOSIS — Z66 Do not resuscitate: Secondary | ICD-10-CM

## 2017-08-03 LAB — HIV ANTIBODY (ROUTINE TESTING W REFLEX): HIV Screen 4th Generation wRfx: NONREACTIVE

## 2017-08-03 LAB — CBC
HCT: 27.5 % — ABNORMAL LOW (ref 36.0–46.0)
Hemoglobin: 9 g/dL — ABNORMAL LOW (ref 12.0–15.0)
MCH: 26.2 pg (ref 26.0–34.0)
MCHC: 32.7 g/dL (ref 30.0–36.0)
MCV: 80.2 fL (ref 78.0–100.0)
Platelets: 393 K/uL (ref 150–400)
RBC: 3.43 MIL/uL — ABNORMAL LOW (ref 3.87–5.11)
RDW: 17.1 % — ABNORMAL HIGH (ref 11.5–15.5)
WBC: 13.3 K/uL — ABNORMAL HIGH (ref 4.0–10.5)

## 2017-08-03 LAB — BASIC METABOLIC PANEL
Anion gap: 7 (ref 5–15)
BUN: 13 mg/dL (ref 6–20)
CHLORIDE: 99 mmol/L — AB (ref 101–111)
CO2: 27 mmol/L (ref 22–32)
Calcium: 8.7 mg/dL — ABNORMAL LOW (ref 8.9–10.3)
Creatinine, Ser: 0.56 mg/dL (ref 0.44–1.00)
GFR calc non Af Amer: >60 mL/min (ref >60)
Glucose, Bld: 104 mg/dL — ABNORMAL HIGH (ref 65–99)
POTASSIUM: 4.1 mmol/L (ref 3.5–5.1)
SODIUM: 133 mmol/L — AB (ref 135–145)

## 2017-08-03 MED ORDER — TRAZODONE HCL 100 MG PO TABS: 100.0000 mg | ORAL_TABLET | Freq: Every evening | ORAL | 1 refills | Status: DC | PRN

## 2017-08-03 MED ORDER — SENNA 8.6 MG PO TABS: 1.0000 | ORAL_TABLET | Freq: Two times a day (BID) | ORAL | 0 refills | Status: DC

## 2017-08-03 MED ORDER — ONDANSETRON HCL 8 MG PO TABS: 8.0000 mg | ORAL_TABLET | Freq: Two times a day (BID) | ORAL | 0 refills | Status: DC | PRN

## 2017-08-03 MED ORDER — HYDROMORPHONE HCL 4 MG PO TABS: 4.0000 mg | ORAL_TABLET | ORAL | 0 refills | Status: DC | PRN

## 2017-08-03 MED ORDER — LACTULOSE 20 GM/30ML PO SOLN: 30.0000 mL | Freq: Two times a day (BID) | ORAL | 1 refills | Status: DC | PRN

## 2017-08-03 MED ORDER — LORAZEPAM 1 MG PO TABS: 1.0000 mg | ORAL_TABLET | Freq: Every day | ORAL | 0 refills | Status: DC

## 2017-08-03 MED FILL — LYNPARZA 100 MG TAB: 100 | 30 days supply | Qty: 120 | Fill #0

## 2017-08-03 NOTE — Progress Notes (Signed)
HEMATOLOGY-ONCOLOGY PROGRESS NOTE  SUBJECTIVE: Metastatic breast cancer, patient admitted for severe fatigue, severe anemia status post blood transfusion, improvement in performance status Patient rejects hospice care  OBJECTIVE: REVIEW OF SYSTEMS:   Constitutional: Profound fatigue and weight loss Eyes: Denies blurriness of vision Ears, nose, mouth, throat, and face: Extremely large scalp lesion metastatic deposit Respiratory: Denies cough, dyspnea or wheezes Cardiovascular: Denies palpitation, chest discomfort Gastrointestinal:  Denies nausea, heartburn or change in bowel habits Skin: Denies abnormal skin rashes Lymphatics: Denies new lymphadenopathy or easy bruising Neurological:Denies numbness, tingling or new weaknesses Behavioral/Psych: Mood is stable, no new changes  Extremities: No lower extremity edema Breast: Large left chest wall tumor All other systems were reviewed with the patient and are negative.  I have reviewed the past medical history, past surgical history, social history and family history with the patient and they are unchanged from previous note.   PHYSICAL EXAMINATION: ECOG PERFORMANCE STATUS: 3 - Symptomatic, >50% confined to bed  Vitals:   08/03/17 0529 08/03/17 1339  BP: 107/66 97/61  Pulse: 98 86  Resp: 16 18  Temp: 99.7 F (37.6 C) 98.6 F (37 C)  SpO2: 95% 99%   There were no vitals filed for this visit.  GENERAL:alert, no distress and comfortable SKIN: skin color, texture, turgor are normal, no rashes or significant lesions EYES: normal, Conjunctiva are pink and non-injected, sclera clear OROPHARYNX:no exudate, no erythema and lips, buccal mucosa, and tongue normal  NECK: supple, thyroid normal size, non-tender, without nodularity LYMPH:  no palpable lymphadenopathy in the cervical, axillary or inguinal LUNGS: clear to auscultation and percussion with normal breathing effort HEART: regular rate & rhythm and no murmurs and no lower extremity  edema ABDOMEN:abdomen soft, non-tender and normal bowel sounds Musculoskeletal:no cyanosis of digits and no clubbing  NEURO: alert & oriented x 3 with fluent speech, no focal motor/sensory deficits  LABORATORY DATA:  I have reviewed the data as listed CMP Latest Ref Rng & Units 08/03/2017 08/02/2017 07/20/2017  Glucose 65 - 99 mg/dL 104(H) 108(H) 108(H)  BUN 6 - 20 mg/dL 13 20 18   Creatinine 0.44 - 1.00 mg/dL 0.56 0.53 0.69  Sodium 135 - 145 mmol/L 133(L) 132(L) 132(L)  Potassium 3.5 - 5.1 mmol/L 4.1 3.8 4.0  Chloride 101 - 111 mmol/L 99(L) 96(L) 98(L)  CO2 22 - 32 mmol/L 27 27 24   Calcium 8.9 - 10.3 mg/dL 8.7(L) 8.9 8.8(L)  Total Protein 6.5 - 8.1 g/dL - 6.4(L) -  Total Bilirubin 0.3 - 1.2 mg/dL - 0.7 -  Alkaline Phos 38 - 126 U/L - 60 -  AST 15 - 41 U/L - 45(H) -  ALT 14 - 54 U/L - 20 -    Lab Results  Component Value Date   WBC 13.3 (H) 08/03/2017   HGB 9.0 (L) 08/03/2017   HCT 27.5 (L) 08/03/2017   MCV 80.2 08/03/2017   PLT 393 08/03/2017   NEUTROABS 10.8 (H) 08/02/2017    ASSESSMENT AND PLAN: Metastatic breast cancer: Progressed on multiple lines of therapy: Patient is not ready to accept hospice even though her disease has been progressing and her performance status has been declining.  The last treatment that she was on was olaparib we have not given this medicine for more than 1-2 weeks before it was discontinued by hospice care previously. She would like to try to go back on this medication. I discussed with her that her performance status has been declining and this indicates that she may not be able to  tolerate any treatment. Patient was reluctant and adamant about trying to take the medication.  Plan: Patient will come weekly for blood count checks and transfusions as needed If her performance status significantly declines then we will stop the treatment. Patient agrees with this plan. She will accept hospice care if the current treatment was felt to have  significant side effects or her performance status continues to decline.  Severe anemia: Probably due to decreased production.  As needed blood transfusions as outpatient I will see her next Friday and arrange for follow-up and blood transfusions as needed. I instructed her she is okay to be discharged from my standpoint.

## 2017-08-03 NOTE — Discharge Summary (Signed)
Paula Navarro, is a 67 y.o. female  DOB [birth year]  MRN [id number].  Admission date:  08/02/2017  Admitting Physician  Roxan Hockey, MD  Discharge Date:  08/03/2017   Primary MD  [name], MD  Recommendations for primary care physician for things to follow:    Admission Diagnosis  Metastatic cancer ([place]) [C79.9] Anemia, unspecified type [D64.9] Bilateral malignant neoplasm involving both nipple and areola in female, unspecified estrogen receptor status ([place]) [C50.011, C50.012]   Discharge Diagnosis  Metastatic cancer ([place]) [C79.9] Anemia, unspecified type [D64.9] Bilateral malignant neoplasm involving both nipple and areola in female, unspecified estrogen receptor status ([place]) [C50.011, C50.012]   Principal Problem:   Symptomatic anemia Active Problems:   Bilateral breast cancer ([place])   Metastatic breast cancer ([place])   Microcytic anemia   COPD (chronic obstructive pulmonary disease) ([place])   Endometrial cancer ([place])   Anemia      Past Medical History:  Diagnosis Date  . Anemia   . Anxiety   . Atrial septal defect 1996   Surgical repair in 1996  . Breast cancer ([place]) dx'd 06/2013  . Chest pain    Admitted to [facility] in 09/2011; refused stress test  . Chronic bronchitis   . Chronic pain   . COPD (chronic obstructive pulmonary disease) ([place])    on xray  . Endometrial cancer ([place])   . Metastatic cancer to lung ([place]) dx'd 2017  . Palpitation    Tachycardia reported by monitor clerk during a symptomatic spell  . Radiation 06/30/14-08/17/14   Bilateral Breast  . Tobacco abuse    60 pack years; 1.5 packs per day  . Wears dentures    top    Past Surgical History:  Procedure Laterality Date  . ASD REPAIR, OSTIUM PRIMUM  1996   dr Roxy Horseman  . AXILLARY LYMPH NODE DISSECTION Bilateral 04/08/2014   Procedure:  BILATERAL AXILLARY LYMPH NODE DISSECTION;  Surgeon: Autumn Messing III, MD;  Location: [facility];  Service: General;  Laterality: Bilateral;  . BREAST BIOPSY Bilateral   . BREAST LUMPECTOMY WITH RADIOACTIVE SEED LOCALIZATION Bilateral 04/08/2014   Procedure: BILATERAL  RADIOACTIVE SEED LOCALIZATION LUMPECTOMY ;  Surgeon: Autumn Messing III, MD;  Location: [facility];  Service: General;  Laterality: Bilateral;  . CESAREAN SECTION     x3  . CHOLECYSTECTOMY    . LAPAROTOMY N/A 02/13/2017   Procedure: MINI EXPLORATORY LAPAROTOMY;  Surgeon: Everitt Amber, MD;  Location: [facility];  Service: Gynecology;  Laterality: N/A;  . open heart surgery    . PORT A CATH REVISION  1/15   put in   . ROBOTIC ASSISTED TOTAL HYSTERECTOMY WITH BILATERAL SALPINGO OOPHERECTOMY Bilateral 02/13/2017   Procedure: XI ROBOTIC ASSISTED TOTAL HYSTERECTOMY WITH BILATERAL SALPINGO OOPHORECTOMY WITH LYSIS OF ADHESIONS, UTERUS GREATER THAN 250GRAMS;  Surgeon: Everitt Amber, MD;  Location: [facility];  Service: Gynecology;  Laterality: Bilateral;  . TUBAL LIGATION         HPI  from the history and physical done on  the day of admission:   Paula Navarro  is a 67 y.o. female with medical history significant ofmicrocytic anemia, anxiety, history of atrial septal defect and repair in 1996, metastatic breast cancer, COPD/chronic bronchitis, prior h/o endometrial cancer and chronic pain syndrome who presents from oncologist office with complaints of fatigue, malaise, and found to have a hemoglobin of 5.6 with low MCV and MCH.   In ED.... Patient complains of increasing fatigue and dyspnea on exertion, as well as increasing pain, additional history obtained from patient's niece at bedside, patient denies any change in his stool color or consistency, no epistaxis.  Evidence of ongoing blood loss, patient has had multiple transfusions in the past, posttransfusion hemoglobin was 9.1 about 11 days  Patient has history of endometrial cancer, and then developed bilateral breast cancer, patient has failed radiation  therapy, hormone therapy and chemotherapy  At This time patient is interested in hospice and palliative care services  WBC is elevated-suspect reactive leukocytosis in the setting of underlying malignancy  ED provider ordered transfusion of 2 units of packed cells for symptomatic anemia     Hospital Course:    1)Symptomatic Anemia-acute on chronic anemia, suspect due to underlying malignancy and prior radiation and chemotherapy , hemoglobin is up to 9.0 from 5.6 after transfusion of 2 units of packed cells, patient is feeling much better, no further dyspnea on exertion or dizziness or palpitations at this time.  no evidence of ongoing bleeding or acute blood loss at this time.  At the time of admission patient was very symptomatic with tachycardia, dyspnea on exertion, fatigue, malaise, and dizziness, however after transfusion of 2 units of packed cells patient feels much better now, above symptoms have resolved  2)Social/Ethics- Patient has history of endometrial cancer, and then developed bilateral breast cancer, patient has failed radiation therapy, hormone therapy and chemotherapy.   At this timepatient is no longer interested in care or hospice services .  The patient states she changed her mind . CODE STATUS discussed and she is a full code.  If patient reconsiders palliative and hospice consult she may follow-up with PCP or oncologist to facilitate this  3)Chronic Pain Syndrome-in the setting of underlying metastatic breast cancer, continue Duragesic patch,po Dilaudid for breakthrough pain   Discharge Condition: stable  Follow UP-Dr. Lindi Adie   Consults obtained -patient declines palliative and hospice consult at this time  Diet and Activity recommendation:  As advised  Discharge Instructions     Discharge Instructions    Call MD for:  difficulty breathing, headache or visual disturbances   Complete by:  As directed    Call MD for:  persistant dizziness or  light-headedness   Complete by:  As directed    Call MD for:  persistant nausea and vomiting   Complete by:  As directed    Call MD for:  redness, tenderness, or signs of infection (pain, swelling, redness, odor or green/yellow discharge around incision site)   Complete by:  As directed    Call MD for:  temperature >100.4   Complete by:  As directed    Diet - low sodium heart healthy   Complete by:  As directed    Discharge instructions   Complete by:  As directed    Repeat Complete Blood Count in 1 week Talk to your oncologist Dr. Lindi Adie about enrollment in palliative care and hospice services if you change your mind   Increase activity slowly   Complete by:  As directed  Discharge Medications     Allergies as of 08/03/2017      Reactions   Codeine Other (See Comments)   Hot in chest   Morphine And Related Itching, Rash   Oxycodone Hcl Rash   Red rash      Medication List    STOP taking these medications   zolpidem 5 MG tablet Commonly known as:  AMBIEN     TAKE these medications   acetaminophen 500 MG tablet Commonly known as:  TYLENOL Take 500 mg by mouth 3 (three) times daily as needed (BREAKTHROUGH PAIN).   CALCIUM 500/D 500-400 MG-UNIT Chew Generic drug:  Calcium Carb-Cholecalciferol Chew 1 tablet by mouth daily.   docusate sodium 100 MG capsule Commonly known as:  COLACE Take 1 capsule (100 mg total) by mouth 2 (two) times daily. What changed:  when to take this   fentaNYL 25 MCG/HR patch Commonly known as:  DURAGESIC - dosed mcg/hr Place 1 patch (25 mcg total) onto the skin every 3 (three) days.   gabapentin 300 MG capsule Commonly known as:  NEURONTIN TAKE 2 CAPSULES (600 MG TOTAL) BY MOUTH 3 (THREE) TIMES DAILY.   HYDROmorphone 4 MG tablet Commonly known as:  DILAUDID Take 1 tablet (4 mg total) by mouth every 4 (four) hours as needed for severe pain.   lactose free nutrition Liqd Take 237 mLs by mouth 3 (three) times daily between  meals.   LORazepam 1 MG tablet Commonly known as:  ATIVAN Take 1 tablet (1 mg total) by mouth at bedtime.   ondansetron 8 MG tablet Commonly known as:  ZOFRAN Take 1 tablet (8 mg total) by mouth 2 (two) times daily as needed for nausea or vomiting.   polyethylene glycol packet Commonly known as:  MIRALAX / GLYCOLAX Take 17 g by mouth 2 (two) times daily.   prochlorperazine 10 MG tablet Commonly known as:  COMPAZINE Take 1 tablet (10 mg total) by mouth every 6 (six) hours as needed (Nausea or vomiting).   senna 8.6 MG Tabs tablet Commonly known as:  SENOKOT Take 1 tablet (8.6 mg total) by mouth 2 (two) times daily.   SUPER B COMPLEX/C PO Take 1 tablet by mouth daily.   traZODone 100 MG tablet Commonly known as:  DESYREL Take 1 tablet (100 mg total) by mouth at bedtime as needed for sleep.   VISINE PURE TEARS OP Place 2 drops into both eyes daily as needed (DRY EYES).       Major procedures and Radiology Reports - PLEASE review detailed and final reports for all details, in brief -   Dg Chest Portable 1 View  Result Date: 08/02/2017 CLINICAL DATA:  Weakness.  Metastatic cancer.  Heart racing. EXAM: PORTABLE CHEST 1 VIEW COMPARISON:  07/20/2015. FINDINGS: Surgical clips noted over both chest. Port catheter noted with tip over the superior vena cava. Heart size normal. Right hilar and infrahilar fullness noted suggesting adenopathy. Pulmonary densities are noted in the right lung suggesting metastatic disease. Old left rib fractures are identified. Left mastectomy. IMPRESSION: 1. PowerPort catheter noted with tip over superior vena cava. 2. Right hilar and infrahilar fullness suggesting adenopathy. Right pulmonary densities noted suggesting metastatic disease. CT can be obtained for further evaluation is needed. 3. Prior median sternotomy. 4. Left mastectomy.  Surgical clips noted over both axilla. Electronically Signed   By: Marcello Moores  Register   On: 08/02/2017 16:39   Dg Abd Acute  W/chest  Result Date: 07/20/2017 CLINICAL DATA:  Metastatic breast carcinoma  with pain and weakness EXAM: DG ABDOMEN ACUTE W/ 1V CHEST COMPARISON:  CT abdomen and pelvis June 11, 2017 ; chest CT February 05, 2017 FINDINGS: PA chest: There are nodular opacities in the right lower lobe, similar to recent CT, felt to represent metastatic foci. There is a nodular lesion measuring 2.0 x 1.1 cm with a nearby nodular lesion measuring 2.3 x 1.5 cm. There is no edema or consolidation. Port-A-Cath tip is in the superior vena cava with Huber needle within the port. Heart size and pulmonary vascularity are normal. No adenopathy. There is postoperative change in each axilla. Supine and upright abdomen: No bowel dilatation. Scattered air-fluid levels present. No free air. Moderate stool throughout colon. Phleboliths noted in pelvis. Clip noted right upper quadrant. IMPRESSION: 1. Air-fluid levels without bowel obstruction. Suspect enteritis or early ileus. Bowel obstruction felt to be less likely. There is moderate stool throughout the colon. 2. Nodular opacities right lung base, likely metastatic foci. No lung edema or consolidation. Electronically Signed   By: Lowella Grip III M.D.   On: 07/20/2017 00:05   Ct Angio Abd/pel W/ And/or W/o  Result Date: 07/21/2017 CLINICAL DATA:  Rectal bleeding and GI bleed requiring transfusion. History of metastatic breast carcinoma and endometrial carcinoma with known large right-sided retroperitoneal/para-aortic soft tissue mass. EXAM: CT ANGIOGRAPHY ABDOMEN AND PELVIS WITH CONTRAST AND WITHOUT CONTRAST TECHNIQUE: Multidetector CT imaging of the abdomen and pelvis was performed using the standard protocol during bolus administration of intravenous contrast. Multiplanar reconstructed images and MIPs were obtained and reviewed to evaluate the vascular anatomy. CONTRAST:  166mL ISOVUE-370 IOPAMIDOL (ISOVUE-370) INJECTION 76% COMPARISON:  CT of the abdomen and pelvis without contrast  on 06/11/2017 and CT of the chest, abdomen and pelvis with contrast on 02/05/2017 FINDINGS: VASCULAR Aorta: The abdominal aorta is normally patent. There is no evidence of aortoenteric fistula by CTA or aneurysmal disease. Celiac: Normally patent. Distal branches are well visualized and normally patent. SMA: Normally patent. Renals: Bilateral single renal arteries demonstrate normal patency. IMA: Normally patent. Inflow: Bilateral iliac artery is demonstrate scattered calcified plaque without evidence of stenosis or aneurysm. Proximal Outflow: Normally patent common femoral artery's and femoral bifurcations. Veins: Venous phase imaging shows normal patency of the mesenteric veins, splenic vein and portal vein. Common femoral and iliac vein show normal patency. The IVC is compressed by the right-sided retroperitoneal mass without evidence of thrombosis. Review of the MIP images confirms the above findings. NON-VASCULAR Lower chest: Several right lower lung metastatic masses have enlarged since the prior CT on 06/11/2017. Anterior subpleural right middle lobe mass now measures 2.5 cm compared to 1.5 cm. Posterior right lower lobe mass measures 2.0 cm compared to 1.5 cm. No associated basilar pleural effusions. Hepatobiliary: No focal hepatic masses are identified. There is evidence of mild intrahepatic and extrahepatic biliary dilatation which may be secondary to mass effect from the large right-sided retroperitoneal mass. Correlation suggested with bilirubin level. The gallbladder has been removed. Pancreas: Unremarkable. No pancreatic ductal dilatation or surrounding inflammatory changes. Spleen: Normal in size without focal abnormality. Adrenals/Urinary Tract: Adrenal glands are unremarkable. Kidneys are normal, without renal calculi, focal lesion, or hydronephrosis. Bladder is unremarkable. Stomach/Bowel: No evidence of bowel obstruction or inflammation. A large right-sided retroperitoneal mass may be invading the  duodenum and could be a source of bleeding into the duodenal lumen. On arterial and venous phases of imaging, and no obvious contrast extravasation is noted into the bowel. No free air. Lymphatic: The large right retroperitoneal soft tissue  mass represents either a lymph node metastasis or soft tissue metastasis. This shows enlargement since prior imaging and now measures approximately 7.4 x 7.6 x 7.0 cm with maximal oblique diameter of 8.3 cm. This mass shows irregular contrast enhancement with some areas of central necrosis. The mass exerts significant mass effect on the duodenum and may be invading the duodenum. The mass also may invade adjacent vascular structures including branches of the superior mesenteric artery, IVC, left renal vein, aorta and right common iliac artery. There is no obvious fistula or contrast extravasation. No thrombus is identified in any of these vascular structures. Contiguous necrotic soft tissue mass likely representing lymphadenopathy in the right common iliac chain measures approximately 3 cm in short axis and has enlarged since prior imaging. There also likely is tumor versus lymphadenopathy deeper in the right pelvis medial to branches of the internal iliac artery and measuring approximately 1.4 cm. Reproductive: Status post hysterectomy. No adnexal masses. Other: No hernias identified.  No significant ascites. Musculoskeletal: No bone metastases or fractures identified. IMPRESSION: VASCULAR 1. No evidence of aortoenteric fistula or other obvious arterial source of bleeding into the gastrointestinal tract by CTA. 2. The large 8 cm right-sided retroperitoneal metastatic lymph node/mass exerts significant mass effect on the IVC as well as additional adjacent vascular structures and may be causing vascular invasion without evidence of visible thrombosis by CTA. The mass may be invading the duodenum and causing bleeding into the duodenal lumen. NON-VASCULAR 1. Progression of metastatic  disease with enlargement of right basilar metastatic lung nodules as well as enlargement of the large right-sided retroperitoneal soft tissue mass which likely represents a huge metastatic lymph node mass. As above, this mass exerts significant mass effect on the duodenum and may be invading the duodenum. This could be a source of bleeding into the duodenal lumen. The mass also may be causing some relative mass-effect on the common bile duct resulting in some mild intrahepatic and extrahepatic biliary ductal dilatation. 2. Continuous necrotic lymph no metastasis in the right common iliac chain demonstrates enlargement since prior imaging. Probable tumor deposit in the right internal iliac artery chain is also present deeper in the right pelvis. Electronically Signed   By: Aletta Edouard M.D.   On: 07/21/2017 13:22    Micro Results   No results found for this or any previous visit (from the past 240 hour(s)).     Today   Subjective    Paula Navarro today has no new complains, she is now refusing palliative and hospice consult          Patient has been seen and examined prior to discharge   Objective   Blood pressure 107/66, pulse 98, temperature 99.7 F (37.6 C), temperature source Oral, resp. rate 16, last menstrual period 05/20/2011, SpO2 95 %.   Intake/Output Summary (Last 24 hours) at 08/03/2017 0936 Last data filed at 08/03/2017 9924 Gross per 24 hour  Intake 2719 ml  Output -  Net 2719 ml    Exam Gen:- Awake  In no apparent distress , cachectic appearing HEENT:- [place].AT,  Scalp mass Neck-Supple Neck,No JVD,  Lungs- mostly clear  Breast -left breast mass CV- S1, S2 normal, right chest Port-A-Cath site is clean dry and intact Abd-  +ve B.Sounds, Abd Soft, No tenderness,    Extremities - no pedal edema noted, intact peripheral pulses  Skin - warm, dry Psych- AAO x 3 Neuro-Generalized weakness without new focal deficits    Data Review   CBC w Diff:  Lab Results  Component  Value Date   WBC 13.3 (H) 08/03/2017   HGB 9.0 (L) 08/03/2017   HGB 6.7 (LL) 07/19/2017   HCT 27.5 (L) 08/03/2017   HCT 21.0 (L) 07/19/2017   PLT 393 08/03/2017   PLT 444 (H) 07/19/2017   LYMPHOPCT 9 08/02/2017   LYMPHOPCT 11.0 (L) 07/19/2017   MONOPCT 7 08/02/2017   MONOPCT 6.9 07/19/2017   EOSPCT 0 08/02/2017   EOSPCT 0.5 07/19/2017   BASOPCT 0 08/02/2017   BASOPCT 0.5 07/19/2017    CMP:  Lab Results  Component Value Date   NA 133 (L) 08/03/2017   NA 134 (L) 07/19/2017   K 4.1 08/03/2017   K 3.9 07/19/2017   CL 99 (L) 08/03/2017   CO2 27 08/03/2017   CO2 27 07/19/2017   BUN 13 08/03/2017   BUN 14.0 07/19/2017   CREATININE 0.56 08/03/2017   CREATININE 0.8 07/19/2017   PROT 6.4 (L) 08/02/2017   PROT 7.0 07/19/2017   ALBUMIN 2.5 (L) 08/02/2017   ALBUMIN 2.5 (L) 07/19/2017   BILITOT 0.7 08/02/2017   BILITOT 0.42 07/19/2017   ALKPHOS 60 08/02/2017   ALKPHOS 64 07/19/2017   AST 45 (H) 08/02/2017   AST 30 07/19/2017   ALT 20 08/02/2017   ALT 12 07/19/2017  .   Total Discharge time is about 33 minutes  Roxan Hockey M.D on 08/03/2017 at 9:36 AM  [facility]   Office  [phone]  Voice Recognition Viviann Spare dictation system was used to create this note, attempts have been made to correct errors. Please contact the author with questions and/or clarifications.

## 2017-08-03 NOTE — Progress Notes (Signed)
[place] CSW Progress Notes  Call from [place], [place] CSW.  Pt wants support group resources for breast cancer diagnosis.  CSW reviewed chart, noted pt lives in [facility] and has issues w transportation.  Provided information on [place] support group time/date, also advised pt be put in contact w [employer] so that phone support might be established; contact information provided.  CSW asked CSW Lorenda Cahill to see if patient is interested in completing Advance Directives while inpatient.  Per chart, patient uses Medicaid transport for appointments - may not be available for nights/weekends.  Edwyna Shell, LCSW Clinical Social Worker Phone:  [phone]

## 2017-08-03 NOTE — Progress Notes (Signed)
This CM met with pt and son at bedside to offer choice for home health services. Pt declines home health services at this time and says that her daughter takes care of her full time. Pt also declines any DME for home. Marney Doctor RN,BSN,NCM [phone]

## 2017-08-03 NOTE — Care Management Obs Status (Signed)
[facility] NOTIFICATION   Patient Details  Name: Paula Navarro MRN: [id number] Date of Birth: [birth year]   Medicare Observation Status Notification Given:  Yes    Lynnell Catalan, RN 08/03/2017, 10:33 AM

## 2017-08-03 NOTE — Clinical Social Work Note (Signed)
Clinical Social Work Assessment  Patient Details  Name: Paula Navarro MRN: [id number] Date of Birth: January 08, 1951  Date of referral:  08/03/17               Reason for consult:  [employer]                Permission sought to share information with:    Permission granted to share information::     Name::        Agency::     Relationship::     Contact Information:     Housing/Transportation Living arrangements for the past 2 months:  Single Family Home Source of Information:  Patient, Medical Team Patient Interpreter Needed:  None Criminal Activity/Legal Involvement Pertinent to Current Situation/Hospitalization:  No - Comment as needed Significant Relationships:  Adult Children Lives with:  Self Do you feel safe going back to the place where you live?  Yes Need for family participation in patient care:  No (Coment)  Care giving concerns:  Pt from home where she resides alone- family nearby and highly supportive. No caregiving concerns noted other than pt stating she "doesn't want to be home alone."   Facilities manager / plan:  CSW was initially consulted to assess possible referrals to hospice or palliative care, however discussed with attending- at this time pt has decided she is not interested in hospice and "wants to try experimental treatments." Met with pt at bedside- she spoke at length about feeling as if she was "mistreated by them telling me I need hospice now." CSW explained that hospice/palliative is suggested by providers when there is need for symptom management usually in light of pts having no further viable treatment options but that enlisting these services is at the pt/family's wishes and not required. This de-escalated pt somewhat.  She then discussed that she plans to see oncologist at [facility] for second opinion.  CSW inquired as to pt having support/transportation/other social issues. She states that "she doesn't want to go home alone" but then states  this is because her daughter who usually stays with her is on vacation. States daughter may come home early today in light of this situation. States she is independent with ambulation and ADLs at home.  Pt does report she has not engaged in much other support during the course of her cancer treatment. CSW contacted [facility] CSW and provided pt with information on support groups and [facility] program. Pt receptive. Pt states she uses Medicaid transportation to appointments. No further needs.  Employment status:  Disabled (Comment on whether or not currently receiving Disability)(recieves disability) Nurse, adult, Medicaid In [place] PT Recommendations:  Not assessed at this time Information / Referral to community resources:  Support Groups  Patient/Family's Response to care:  Pt is upset re: "having been told she needed hospice" and states "I think y'all are being mean to me"  Patient/Family's Understanding of and Emotional Response to Diagnosis, Current Treatment, and Prognosis:  Pt does demonstrate understanding of her current prognosis but is not accepting and plans to seek further treatment options. Emotionally is frustrated and angry. States, "I just don't want to go home today."  Emotional Assessment Appearance:  Appears stated age Attitude/Demeanor/Rapport:  (irritable) Affect (typically observed):  Frustrated Orientation:  Oriented to Self, Oriented to Place, Oriented to  Time, Oriented to Situation Alcohol / Substance use:  Not Applicable Psych involvement (Current and /or in the community):  No (Comment)  Discharge Needs  Concerns  to be addressed:  Decision making concerns, Adjustment to Illness Readmission within the last 30 days:  No Current discharge risk:  None Barriers to Discharge:  No Barriers Identified   Nila Nephew, LCSW 08/03/2017, 10:24 AM  [phone]

## 2017-08-03 NOTE — Discharge Instructions (Signed)
[phone] or program coordinator [phone]. Support and [place] 6:30pm, every 3rd Tuesday of the month

## 2017-08-03 NOTE — Care Management CC44 (Signed)
Condition Code 44 Documentation Completed  Patient Details  Name: Paula Navarro MRN: [id number] Date of Birth: 16-Apr-1951   Condition Code 44 given:  Yes Patient signature on Condition Code 44 notice:  Yes Documentation of 2 MD's agreement:  Yes Code 44 added to claim:  Yes    Lynnell Catalan, RN 08/03/2017, 10:33 AM

## 2017-08-03 NOTE — Progress Notes (Signed)
[facility]  Patient Status: Active (receiving services up to time of hospitalization)  [facility] is providing the following services: RN  If patient discharges after hours, please call [phone].   Edwinna Areola 08/03/2017, 10:09 AM

## 2017-08-04 LAB — BPAM RBC
BLOOD PRODUCT EXPIRATION DATE: 201901212359
Blood Product Expiration Date: 201901292359
ISSUE DATE / TIME: 201901032239
ISSUE DATE / TIME: 201901040147
Unit Type and Rh: 600
Unit Type and Rh: 7300

## 2017-08-04 LAB — TYPE AND SCREEN
ABO/RH(D): AB POS
Antibody Screen: NEGATIVE
DONOR AG TYPE: NEGATIVE
Donor AG Type: NEGATIVE
UNIT DIVISION: 0
UNIT DIVISION: 0

## 2017-08-06 ENCOUNTER — Emergency Department (HOSPITAL_COMMUNITY): Payer: Medicare Other

## 2017-08-06 ENCOUNTER — Other Ambulatory Visit: Payer: Medicare Other

## 2017-08-06 ENCOUNTER — Other Ambulatory Visit: Payer: Self-pay

## 2017-08-06 ENCOUNTER — Telehealth: Payer: Self-pay | Admitting: Pharmacist

## 2017-08-06 ENCOUNTER — Emergency Department (HOSPITAL_COMMUNITY)
Admission: EM | Admit: 2017-08-06 | Discharge: 2017-08-06 | Disposition: A | Discharge status: Home / Self Care | Payer: Medicare Other | Attending: Emergency Medicine | Admitting: Emergency Medicine

## 2017-08-06 ENCOUNTER — Encounter (HOSPITAL_COMMUNITY): Payer: Self-pay | Admitting: Emergency Medicine

## 2017-08-06 ENCOUNTER — Ambulatory Visit: Payer: Medicare Other | Admitting: Hematology and Oncology

## 2017-08-06 DIAGNOSIS — M5440 Lumbago with sciatica, unspecified side: Secondary | ICD-10-CM | POA: Diagnosis not present

## 2017-08-06 DIAGNOSIS — C50919 Malignant neoplasm of unspecified site of unspecified female breast: Secondary | ICD-10-CM | POA: Diagnosis not present

## 2017-08-06 DIAGNOSIS — Z79899 Other long term (current) drug therapy: Secondary | ICD-10-CM | POA: Insufficient documentation

## 2017-08-06 DIAGNOSIS — J449 Chronic obstructive pulmonary disease, unspecified: Secondary | ICD-10-CM | POA: Insufficient documentation

## 2017-08-06 DIAGNOSIS — Z87891 Personal history of nicotine dependence: Secondary | ICD-10-CM | POA: Insufficient documentation

## 2017-08-06 DIAGNOSIS — M545 Low back pain: Secondary | ICD-10-CM | POA: Diagnosis present

## 2017-08-06 DIAGNOSIS — G8929 Other chronic pain: Secondary | ICD-10-CM | POA: Insufficient documentation

## 2017-08-06 MED ORDER — HYDROMORPHONE HCL 2 MG PO TABS
4.0000 mg | ORAL_TABLET | Freq: Once | ORAL | Status: DC
  Filled 2017-08-06: qty 2

## 2017-08-06 MED ORDER — ONDANSETRON HCL 4 MG PO TABS: 4.0000 mg | ORAL_TABLET | Freq: Four times a day (QID) | ORAL | 0 refills | Status: AC

## 2017-08-06 MED ORDER — HYDROMORPHONE HCL 1 MG/ML IJ SOLN
1.0000 mg | Freq: Once | INTRAMUSCULAR | Status: AC
  Administered 2017-08-06: 1 mg via INTRAVENOUS
  Filled 2017-08-06: qty 1

## 2017-08-06 MED ORDER — GI COCKTAIL ~~LOC~~
30.0000 mL | Freq: Once | ORAL | Status: AC
  Administered 2017-08-06: 30 mL via ORAL
  Filled 2017-08-06: qty 30

## 2017-08-06 MED ORDER — ONDANSETRON HCL 4 MG/2ML IJ SOLN
4.0000 mg | Freq: Once | INTRAMUSCULAR | Status: AC
  Administered 2017-08-06: 4 mg via INTRAVENOUS
  Filled 2017-08-06: qty 2

## 2017-08-06 MED ORDER — SODIUM CHLORIDE 0.9 % IV SOLN
INTRAVENOUS | Status: DC
  Administered 2017-08-06: 13:00:00 via INTRAVENOUS

## 2017-08-06 MED ORDER — HYDROMORPHONE HCL 4 MG PO TABS: 4.0000 mg | ORAL_TABLET | ORAL | 0 refills | Status: DC | PRN

## 2017-08-06 MED ORDER — ONDANSETRON HCL 4 MG PO TABS
4.0000 mg | ORAL_TABLET | Freq: Once | ORAL | Status: AC
  Administered 2017-08-06: 4 mg via ORAL
  Filled 2017-08-06: qty 1

## 2017-08-06 MED ORDER — ONDANSETRON HCL 4 MG PO TABS
4.0000 mg | ORAL_TABLET | Freq: Once | ORAL | Status: AC
Start: 2017-08-06 — End: 2017-08-06
  Administered 2017-08-06: 4 mg via ORAL
  Filled 2017-08-06: qty 1

## 2017-08-06 MED ORDER — HYDROMORPHONE HCL 4 MG PO TABS
4.0000 mg | ORAL_TABLET | ORAL | 0 refills | Status: DC | PRN
Start: 2017-08-06 — End: 2017-08-06

## 2017-08-06 NOTE — Discharge Instructions (Signed)
Please use dilaudid every 6 hours as needed for pain. Use zofran for nausea if needed. See your MD's for additional evaluation as soon as possible.

## 2017-08-06 NOTE — ED Notes (Signed)
Pt refused to ambulate two times. 1st time, wanted pain meds which pt received. Second time, pt outright refused.

## 2017-08-06 NOTE — ED Notes (Signed)
ED Provider at bedside. Warm compresses given to patient for her back

## 2017-08-06 NOTE — ED Provider Notes (Signed)
[facility] EMERGENCY DEPARTMENT Provider Note   CSN: [id number] Arrival date & time: 08/06/17  3818     History   Chief Complaint Chief Complaint  Patient presents with  . Back Pain    HPI Paula Navarro is a 67 y.o. female.  Patient is a 67 year old female who presents to the emergency department with a complaint of back pain.  The patient states that she has stage IV breast cancer.  She has chronic obstructive pulmonary disease, chronic pain, anxiety, and failed radiation and chemotherapy treatment.  The patient states that she was admitted to the [facility] approximately 2 weeks ago for anemia problems.  She was most recently admitted to the [facility] long hospital.  She states she takes Dilaudid 4 mg tablets for assistance with her pain.  She is out of this medication and having severe back pain.  She complains of this pain radiating into her abdomen.  She requests assistance with her pain.   The history is provided by the patient.    Past Medical History:  Diagnosis Date  . Anemia   . Anxiety   . Atrial septal defect 1996   Surgical repair in 1996  . Breast cancer ([place]) dx'd 06/2013  . Chest pain    Admitted to [facility] in 09/2011; refused stress test  . Chronic bronchitis   . Chronic pain   . COPD (chronic obstructive pulmonary disease) ([place])    on xray  . Endometrial cancer ([place])   . Metastatic cancer to lung ([place]) dx'd 2017  . Palpitation    Tachycardia reported by monitor clerk during a symptomatic spell  . Radiation 06/30/14-08/17/14   Bilateral Breast  . Tobacco abuse    60 pack years; 1.5 packs per day  . Wears dentures    top    Patient Active Problem List   Diagnosis Date Noted  . Anemia 08/03/2017  . DNR (do not resuscitate) 08/03/2017  . Rectal bleed 07/21/2017  . SIRS (systemic inflammatory response syndrome) ([place]) 07/20/2017  . Hypomagnesemia 07/20/2017  . Hyponatremia 07/20/2017  . Symptomatic anemia 07/20/2017  . Acute post-operative pain  05/10/2017  . Endometrial cancer ([place]) 12/18/2016  . Protein-calorie malnutrition, severe 11/15/2016  . UTI (urinary tract infection) 11/14/2016  . COPD (chronic obstructive pulmonary disease) ([place])   . 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 Chemotherapy-induced thrombocytopenia 06/13/2016  . Scalp lesion 04/01/2016  . Encounter for central line care 12/08/2015  . Port catheter in place 11/19/2015  . Insomnia 11/01/2015  . Microcytic anemia 09/28/2015  . Lung metastases ([place]) 09/10/2015  . Bone metastases ([place]) 07/20/2015  . Goals of care, counseling/discussion 07/13/2015  . Metastatic breast cancer ([place]) 06/29/2015  . Chronic pain 03/16/2015  . Vaginal bleeding 09/24/2014  . Postherpetic neuralgia 09/03/2014  . Lymphedema 09/03/2014  . Suspected herpes zoster left C5 distribution 08/14/2014  . 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 Anxiety 03/03/2014  . Bilateral breast cancer ([place]) 08/11/2013  . Palpitations   . Chest pain   . Atrial septal defect   . Laboratory test 11/25/2011  . Chronic bronchitis   . Tobacco abuse     Past Surgical History:  Procedure Laterality Date  . ASD REPAIR, OSTIUM PRIMUM  1996   dr Roxy Horseman  . AXILLARY LYMPH NODE DISSECTION Bilateral 04/08/2014   Procedure:  BILATERAL AXILLARY LYMPH NODE DISSECTION;  Surgeon: Autumn Messing III, MD;  Location: [facility];  Service: General;  Laterality: Bilateral;  .  BREAST BIOPSY Bilateral   . BREAST LUMPECTOMY WITH RADIOACTIVE SEED LOCALIZATION Bilateral 04/08/2014   Procedure: BILATERAL  RADIOACTIVE SEED LOCALIZATION LUMPECTOMY ;  Surgeon: Autumn Messing III, MD;  Location: [facility];  Service: General;  Laterality: Bilateral;  . CESAREAN SECTION     x3  . CHOLECYSTECTOMY    . LAPAROTOMY N/A 02/13/2017   Procedure: MINI EXPLORATORY LAPAROTOMY;  Surgeon: Everitt Amber, MD;  Location: [facility];  Service: Gynecology;  Laterality: N/A;  . open heart  surgery    . PORT A CATH REVISION  1/15   put in   . ROBOTIC ASSISTED TOTAL HYSTERECTOMY WITH BILATERAL SALPINGO OOPHERECTOMY Bilateral 02/13/2017   Procedure: XI ROBOTIC ASSISTED TOTAL HYSTERECTOMY WITH BILATERAL SALPINGO OOPHORECTOMY WITH LYSIS OF ADHESIONS, UTERUS GREATER THAN 250GRAMS;  Surgeon: Everitt Amber, MD;  Location: [facility];  Service: Gynecology;  Laterality: Bilateral;  . TUBAL LIGATION      OB History    Gravida Para Term Preterm AB Living   4 3 3          SAB TAB Ectopic Multiple Live Births                   Home Medications    Prior to Admission medications   Medication Sig Start Date End Date Taking? Authorizing Provider  acetaminophen (TYLENOL) 500 MG tablet Take 500 mg by mouth 3 (three) times daily as needed (BREAKTHROUGH PAIN).     [provider]  Calcium Carb-Cholecalciferol (CALCIUM 500/D) 500-400 MG-UNIT CHEW Chew 1 tablet by mouth daily.    [provider]  docusate sodium (COLACE) 100 MG capsule Take 1 capsule (100 mg total) by mouth 2 (two) times daily. Patient taking differently: Take 100 mg by mouth daily.  04/06/17   Gardenia Phlegm, NP  fentaNYL (DURAGESIC - DOSED MCG/HR) 25 MCG/HR patch Place 1 patch (25 mcg total) onto the skin every 3 (three) days. 07/22/17   Johnson, Clanford L, MD  gabapentin (NEURONTIN) 300 MG capsule TAKE 2 CAPSULES (600 MG TOTAL) BY MOUTH 3 (THREE) TIMES DAILY. 05/24/17   Nicholas Lose, MD  Glycerin-Hypromellose-PEG 400 (VISINE PURE TEARS OP) Place 2 drops into both eyes daily as needed (DRY EYES).    [provider]  HYDROmorphone (DILAUDID) 4 MG tablet Take 1 tablet (4 mg total) by mouth every 4 (four) hours as needed for severe pain. 08/06/17   Nicholas Lose, MD  lactose free nutrition (BOOST) LIQD Take 237 mLs by mouth 3 (three) times daily between meals. 07/23/17 08/22/17  Johnson, Clanford L, MD  Lactulose 20 GM/30ML SOLN Take 30 mLs (20 g total) by mouth 2 (two) times daily as needed. For  Constipation 08/03/17   Roxan Hockey, MD  LORazepam (ATIVAN) 1 MG tablet Take 1 tablet (1 mg total) by mouth at bedtime. 08/03/17   Roxan Hockey, MD  ondansetron (ZOFRAN) 8 MG tablet Take 1 tablet (8 mg total) by mouth 2 (two) times daily as needed for nausea or vomiting. 08/03/17   Roxan Hockey, MD  polyethylene glycol (MIRALAX / GLYCOLAX) packet Take 17 g by mouth 2 (two) times daily. 04/06/17   Gardenia Phlegm, NP  prochlorperazine (COMPAZINE) 10 MG tablet Take 1 tablet (10 mg total) by mouth every 6 (six) hours as needed (Nausea or vomiting). Patient not taking: Reported on 08/02/2017 07/30/17   Nicholas Lose, MD  senna (SENOKOT) 8.6 MG TABS tablet Take 1 tablet (8.6 mg total) by mouth 2 (two) times daily. 08/03/17   Emokpae,  Courage, MD  SUPER B COMPLEX/C PO Take 1 tablet by mouth daily.    [provider]  traZODone (DESYREL) 100 MG tablet Take 1 tablet (100 mg total) by mouth at bedtime as needed for sleep. 08/03/17   Roxan Hockey, MD    Family History Family History  Problem Relation Age of Onset  . Breast cancer Maternal Aunt     Social History Social History   Tobacco Use  . Smoking status: Former Smoker    Packs/day: 1.50    Years: 40.00    Pack years: 60.00    Types: Cigarettes    Last attempt to quit: 08/01/2012    Years since quitting: 5.0  . Smokeless tobacco: Never Used  Substance Use Topics  . Alcohol use: No  . Drug use: No     Allergies   Codeine; Morphine and related; and Oxycodone hcl   Review of Systems Review of Systems  Constitutional: Positive for activity change, appetite change and chills.       All ROS Neg except as noted in HPI  HENT: Negative for nosebleeds.   Eyes: Negative for photophobia and discharge.  Respiratory: Negative for cough, shortness of breath and wheezing.   Cardiovascular: Negative for chest pain and palpitations.  Gastrointestinal: Negative for abdominal pain and blood in stool.  Genitourinary: Negative  for dysuria, frequency and hematuria.  Musculoskeletal: Positive for arthralgias and back pain. Negative for neck pain.  Skin: Negative.   Neurological: Negative for dizziness, seizures and speech difficulty.  Psychiatric/Behavioral: Negative for confusion and hallucinations.     Physical Exam Updated Vital Signs BP 105/67 (BP Location: Right Arm)   Pulse 97   Temp 98.4 F (36.9 C) (Oral)   Resp 16   Ht 5\' 2"  (1.575 m)   Wt 49.9 kg (110 lb)   LMP 05/20/2011   SpO2 98%   BMI 20.12 kg/m   Physical Exam  Constitutional: She is oriented to person, place, and time. She appears well-developed and well-nourished.  Non-toxic appearance.  HENT:  Head: Normocephalic.  Right Ear: Tympanic membrane and external ear normal.  Left Ear: Tympanic membrane and external ear normal.  There is a mass of the right scalp.  There is mild bleeding drainage present.  Eyes: EOM and lids are normal. Pupils are equal, round, and reactive to light.  Neck: Normal range of motion. Neck supple. Carotid bruit is not present.  Cardiovascular: Normal rate, regular rhythm, normal heart sounds, intact distal pulses and normal pulses.  Pulmonary/Chest: Breath sounds normal. No respiratory distress.  Abdominal: Soft. Bowel sounds are normal. There is tenderness in the right lower quadrant and left lower quadrant. There is no rigidity and no guarding.  There is pain to palpation of the right and left lower quadrants.  Musculoskeletal: Normal range of motion.  There is pain to palpation and with range of motion of the lumbar spine area.  There is paraspinal area tenderness.  Lymphadenopathy:       Head (right side): No submandibular adenopathy present.       Head (left side): No submandibular adenopathy present.    She has no cervical adenopathy.  Neurological: She is alert and oriented to person, place, and time. She has normal strength. No cranial nerve deficit or sensory deficit.  Skin: Skin is warm and dry.    Psychiatric: She has a normal mood and affect. Her speech is normal.  Nursing note and vitals reviewed.    ED Treatments / Results  Labs (  all labs ordered are listed, but only abnormal results are displayed) Labs Reviewed - No data to display  EKG  EKG Interpretation None       Radiology Mr Lumbar Spine Wo Contrast  Result Date: 08/06/2017 CLINICAL DATA:  Increasing back pain. History of stage IV breast cancer. EXAM: MRI LUMBAR SPINE WITHOUT CONTRAST TECHNIQUE: Multiplanar, multisequence MR imaging of the lumbar spine was performed. No intravenous contrast was administered. COMPARISON:  Abdomen and pelvis CT 07/21/2017 FINDINGS: Segmentation:  Standard. Alignment:  Physiologic. Vertebrae: No fracture, evidence of discitis, or aggressive bone lesion. Small signal abnormality in the L1 inferior endplate is attributed to Schmorl's node. Conus medullaris and cauda equina: Conus extends to the L1 level. Conus and cauda equina appear normal. Paraspinal and other soft tissues: Known retroperitoneal adenopathy with bulky mass right para aortic, at least 8.5 cm in size. There is known intra and extrahepatic bile duct dilatation. Necrotic lymphadenopathy in the right iliac chain indistinguishable from the right psoas and broadly along the right upper lumbosacral plexus. Adenopathy visibly contacts the right extraforaminal L4 nerve root. Necrotic adenopathy is seen to invade the right common iliac vein without occlusion. Soft tissue metastasis is seen in the right intrinsic back muscles, 2.6 cm at the level of L3-4. Disc levels: Lower lumbar facet arthropathy.  No degenerative impingement. IMPRESSION: 1. Necrotic right iliac chain lymphadenopathy involves the right lumbosacral plexus. This mass invades the right common iliac vein. 2. 2.6 cm metastasis in the right intrinsic back muscles at the L3-4 level. 3. Known bulky periaortic mass compressing the IVC. 4. Negative for lumbar spine metastasis.  Electronically Signed   By: Monte Fantasia M.D.   On: 08/06/2017 13:54    Procedures Procedures (including critical care time)  Medications Ordered in ED Medications  HYDROmorphone (DILAUDID) tablet 4 mg (4 mg Oral Not Given 08/06/17 1154)  0.9 %  sodium chloride infusion ( Intravenous New Bag/Given 08/06/17 1252)  ondansetron (ZOFRAN) tablet 4 mg (4 mg Oral Given 08/06/17 1100)  HYDROmorphone (DILAUDID) injection 1 mg (1 mg Intravenous Given 08/06/17 1251)  ondansetron (ZOFRAN) injection 4 mg (4 mg Intravenous Given 08/06/17 1251)  HYDROmorphone (DILAUDID) injection 1 mg (1 mg Intravenous Given 08/06/17 1426)  ondansetron (ZOFRAN) tablet 4 mg (4 mg Oral Given 08/06/17 1426)     Initial Impression / Assessment and Plan / ED Course  I have reviewed the triage vital signs and the nursing notes.  Pertinent labs & imaging results that were available during my care of the patient were reviewed by me and considered in my medical decision making (see chart for details).       Final Clinical Impressions(s) / ED Diagnoses MDM Patient has a history of stage IV metastatic cancer of the breast.  She has been having problems with her back for some time now.  She was recently evaluated at the cancer center at [facility].  She is also been bothered with anemia recently but was just transfused approximately 4 or 5 days ago and her hemoglobin was brought up to 9.3.  Patient offered oral Dilaudid for pain.  She states that she is having a great deal of pain in the oral takes too long to work.  An IV was started and the patient was given IV Dilaudid.  Patient's case discussed with Dr. Cathleen Fears.  Patient seen with me by Dr. Winfred Leeds.  Recheck patient states that she is still having pain  And new dose of Dilaudid given to the patient.  Patient  seen with me by Dr. Cathleen Fears.  An MRI of the L-spine was requested to rule out abscess or metastatic related issue causing the patient's pain.  MR of  the lumbar spine shows a necrotic right iliac chain lymphadenopathy.  The mass invades the right common iliac vein.  There is metastasis in the right intrinsic back muscles.  I discussed the case with the patient in terms which she understands.  Patient ambulated in the room with minimal problem.  Patient states she feels better and wants to go.  Prescription for Dilaudid 4 mg and Zofran given to the patient.  The patient is to see the physician at the cancer center in a few days and can have her pain medication renewed at that time.  Patient is in agreement with this plan.   Final diagnoses:  Chronic low back pain with sciatica, sciatica laterality unspecified, unspecified back pain laterality  Metastatic breast cancer [facility])    ED Discharge Orders    None       Lily Kocher, PA-C 08/06/17 1831    Orlie Dakin, MD 08/07/17 [phone]

## 2017-08-06 NOTE — ED Triage Notes (Signed)
She is been treating for ca she is hurting in her trunk of body she is out of her dilaudid.

## 2017-08-06 NOTE — Telephone Encounter (Signed)
Pt daughter came in to the cancer center lobby today to pick up pt dilaudid prescription. Pt is at [facility] ED. Daughter advised to pick up pt [place] script at [facility] today as well. Verbalized understanding. No further needs at this time.

## 2017-08-06 NOTE — ED Provider Notes (Signed)
Patient with known metastatic cancer complains of low back pain radiating around to her lower abdomen.  She denies fever denies loss of bladder or bowel control.  She reports that she ran out of her pain medicine a few days ago.   Orlie Dakin, MD 08/06/17 1429

## 2017-08-06 NOTE — Telephone Encounter (Signed)
Oral Chemotherapy Pharmacist Encounter  Follow-Up Form  Spoke with patient in hospital room on Friday 08/03/17 to follow up regarding patient's oral chemotherapy medication: [name]  Original Start date of oral chemotherapy: 06/26/17  Pt had been contemplating hospice care as her hemoglobin drops, and then continuing to want to fight after she receives transfusion and feels better. Patient had stopped taking Lynparza in late December after agreeing to hospice care and her supply was discarded. Patient now to restart [name] after transfusion and discussion with MD.  Prescription to be prepared at the [facility] today (08/06/17) for pick-up after 2pm for copayment $3.70. Patient plans to re-start Lynparza on 08/07/17.  Follow-up appointments already in place for 08/10/17.  Patient knows to call the office with questions or concerns. [facility]cology [facility] will continue to follow.  Thank you,  Johny Drilling, PharmD, BCPS, BCOP 08/06/2017 10:20 AM [facility]cology [facility] [phone]

## 2017-08-07 ENCOUNTER — Encounter (HOSPITAL_COMMUNITY): Payer: Self-pay | Admitting: Emergency Medicine

## 2017-08-07 ENCOUNTER — Other Ambulatory Visit: Payer: Self-pay

## 2017-08-07 ENCOUNTER — Telehealth: Payer: Self-pay

## 2017-08-07 ENCOUNTER — Emergency Department (HOSPITAL_COMMUNITY)
Admission: EM | Admit: 2017-08-07 | Discharge: 2017-08-07 | Disposition: A | Discharge status: Home / Self Care | Payer: Medicare Other | Attending: Emergency Medicine | Admitting: Emergency Medicine

## 2017-08-07 DIAGNOSIS — J449 Chronic obstructive pulmonary disease, unspecified: Secondary | ICD-10-CM | POA: Insufficient documentation

## 2017-08-07 DIAGNOSIS — Z79899 Other long term (current) drug therapy: Secondary | ICD-10-CM | POA: Insufficient documentation

## 2017-08-07 DIAGNOSIS — C7800 Secondary malignant neoplasm of unspecified lung: Secondary | ICD-10-CM | POA: Diagnosis not present

## 2017-08-07 DIAGNOSIS — G893 Neoplasm related pain (acute) (chronic): Secondary | ICD-10-CM | POA: Insufficient documentation

## 2017-08-07 DIAGNOSIS — Z853 Personal history of malignant neoplasm of breast: Secondary | ICD-10-CM | POA: Insufficient documentation

## 2017-08-07 DIAGNOSIS — M549 Dorsalgia, unspecified: Secondary | ICD-10-CM | POA: Insufficient documentation

## 2017-08-07 DIAGNOSIS — Z87891 Personal history of nicotine dependence: Secondary | ICD-10-CM | POA: Insufficient documentation

## 2017-08-07 DIAGNOSIS — D649 Anemia, unspecified: Secondary | ICD-10-CM | POA: Diagnosis not present

## 2017-08-07 DIAGNOSIS — R52 Pain, unspecified: Secondary | ICD-10-CM

## 2017-08-07 LAB — I-STAT CHEM 8, ED
BUN: 12 mg/dL (ref 6–20)
CALCIUM ION: 1.17 mmol/L (ref 1.15–1.40)
CHLORIDE: 94 mmol/L — AB (ref 101–111)
Creatinine, Ser: 0.6 mg/dL (ref 0.44–1.00)
GLUCOSE: 112 mg/dL — AB (ref 65–99)
HCT: 24 % — ABNORMAL LOW (ref 36.0–46.0)
Hemoglobin: 8.2 g/dL — ABNORMAL LOW (ref 12.0–15.0)
Potassium: 4 mmol/L (ref 3.5–5.1)
SODIUM: 133 mmol/L — AB (ref 135–145)
TCO2: 28 mmol/L (ref 22–32)

## 2017-08-07 MED ORDER — FENTANYL 50 MCG/HR TD PT72: 50.0000 ug | MEDICATED_PATCH | TRANSDERMAL | 0 refills | Status: DC

## 2017-08-07 MED ORDER — MORPHINE SULFATE (PF) 4 MG/ML IV SOLN
INTRAVENOUS | Status: AC
  Filled 2017-08-07: qty 1

## 2017-08-07 MED ORDER — LIDOCAINE 5 % EX PTCH: 1.0000 | MEDICATED_PATCH | CUTANEOUS | 1 refills | Status: DC

## 2017-08-07 MED ORDER — MORPHINE SULFATE (PF) 4 MG/ML IV SOLN: 6.0000 mg | Freq: Once | INTRAVENOUS | Status: DC

## 2017-08-07 MED ORDER — MORPHINE SULFATE 30 MG PO TABS: 30.0000 mg | ORAL_TABLET | Freq: Four times a day (QID) | ORAL | 0 refills | Status: DC | PRN

## 2017-08-07 MED ORDER — MORPHINE SULFATE (PF) 2 MG/ML IV SOLN
INTRAVENOUS | Status: AC
  Administered 2017-08-07: 2 mg via INTRAMUSCULAR
  Filled 2017-08-07: qty 1

## 2017-08-07 MED ORDER — MORPHINE SULFATE (PF) 4 MG/ML IV SOLN
6.0000 mg | Freq: Once | INTRAVENOUS | Status: AC
  Administered 2017-08-07: 6 mg via INTRAMUSCULAR

## 2017-08-07 MED ORDER — MORPHINE SULFATE (PF) 2 MG/ML IV SOLN
INTRAVENOUS | Status: AC
  Administered 2017-08-07: 6 mg via INTRAMUSCULAR
  Filled 2017-08-07: qty 2

## 2017-08-07 NOTE — ED Triage Notes (Signed)
Pt here yesterday for pain management. Pt has cancer with mets. States will agree to hospice then back out last minute. Back again today for pain management. States does not want dilaudid. Wants fentanyl patch increased.

## 2017-08-07 NOTE — Progress Notes (Signed)
Received call from [facility]. Kathlee Nations regarding pt pain not being controlled by fentanyl patch and dilaudid po. Kathlee Nations reports that pt also started bleeding from her head wound dressing. Pt had changed her dressing yesterday and noticed a quarter sized blood saturation. Pt had ambulance come to her home and wanting to be transported to ED because of uncontrolled back pain. Told [facility] nurse that we have attempted to call pt but vm was full and unable to contact pt.   Kathlee Nations suggested to have this RN contact daughter. Avel Sensor, daughter, to let her know that we have been attempting to contact pt this morning but unable to get a hold off. Notified daughter that Dr.Gudena was made aware of pt uncontrolled pain and that he would like fentanyl patch dose increased to 17mcg. Pt may add another fentanyl patch today and continue to take dilaudid po q4h. Aniceto Boss may come in today or tomorrow to come in the cancer center and pick up new fentanyl patch script for pt.   Dr. Lindi Adie also prescribed a lidocaine patch to be applied to affected area daily. This was sent to [facility] pharmacy today and that it will be ready for pick up.   Tasha verbalized understanding and will discuss this with pt after work today and call back with more questions or updates. No further needs at this time.

## 2017-08-07 NOTE — Telephone Encounter (Signed)
Pt called to ask about her pain medication. Called pt back x3 and unable to get a hold of. VM not set up.

## 2017-08-07 NOTE — ED Provider Notes (Signed)
Emergency Department Provider Note   I have reviewed the triage vital signs and the nursing notes.   HISTORY  Chief Complaint Headache (tumor leaking)   HPI Paula Navarro is a 67 y.o. female patient not really here for headache patient states that she has metastatic cancer she has had back pain has been progressively worsening and not get better with home medications.  She says she tried contacting her oncologist and was able to so she came here because of severe pain.  States the Dilaudid she was prescribed yesterday does not seem to be helping.  No new complaints at this time.  I discussed the patient's case with her oncologist Dr. Lindi Adie, who understands the situation and did advise me that she is basically not going to respond to these treatments and he is try to get her to go to hospice previously but she is only agreed to palliative care.  Stated that he tried calling in a medication but she is very left for the emergency room No other associated or modifying symptoms.    Past Medical History:  Diagnosis Date  . Anemia   . Anxiety   . Atrial septal defect 1996   Surgical repair in 1996  . Breast cancer ([place]) dx'd 06/2013  . Chest pain    Admitted to [facility] in 09/2011; refused stress test  . Chronic bronchitis   . Chronic pain   . COPD (chronic obstructive pulmonary disease) ([place])    on xray  . Endometrial cancer ([place])   . Metastatic cancer to lung ([place]) dx'd 2017  . Palpitation    Tachycardia reported by monitor clerk during a symptomatic spell  . Radiation 06/30/14-08/17/14   Bilateral Breast  . Tobacco abuse    60 pack years; 1.5 packs per day  . Wears dentures    top    Patient Active Problem List   Diagnosis Date Noted  . Anemia 08/03/2017  . DNR (do not resuscitate) 08/03/2017  . Rectal bleed 07/21/2017  . SIRS (systemic inflammatory response syndrome) ([place]) 07/20/2017  . Hypomagnesemia 07/20/2017  . Hyponatremia 07/20/2017  . Symptomatic anemia 07/20/2017    . Acute post-operative pain 05/10/2017  . Endometrial cancer ([place]) 12/18/2016  . Protein-calorie malnutrition, severe 11/15/2016  . UTI (urinary tract infection) 11/14/2016  . COPD (chronic obstructive pulmonary disease) ([place])   . 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 Chemotherapy-induced thrombocytopenia 06/13/2016  . Scalp lesion 04/01/2016  . Encounter for central line care 12/08/2015  . Port catheter in place 11/19/2015  . Insomnia 11/01/2015  . Microcytic anemia 09/28/2015  . Lung metastases ([place]) 09/10/2015  . Bone metastases ([place]) 07/20/2015  . Goals of care, counseling/discussion 07/13/2015  . Metastatic breast cancer ([place]) 06/29/2015  . Chronic pain 03/16/2015  . Vaginal bleeding 09/24/2014  . Postherpetic neuralgia 09/03/2014  . Lymphedema 09/03/2014  . Suspected herpes zoster left C5 distribution 08/14/2014  . Neuropathy due to chemotherapeutic drug ([place]) 03/03/2014  . Anxiety 03/03/2014  . Bilateral breast cancer ([place]) 08/11/2013  . Palpitations   . Chest pain   . Atrial septal defect   . Laboratory test 11/25/2011  . Chronic bronchitis   . Tobacco abuse     Past Surgical History:  Procedure Laterality Date  . ASD REPAIR, OSTIUM PRIMUM  1996   dr Roxy Horseman  . AXILLARY LYMPH NODE DISSECTION Bilateral 04/08/2014   Procedure:  BILATERAL AXILLARY LYMPH NODE DISSECTION;  Surgeon: Autumn Messing III, MD;  Location: [facility];  Service: General;  Laterality: Bilateral;  . BREAST BIOPSY Bilateral   . BREAST LUMPECTOMY WITH RADIOACTIVE SEED LOCALIZATION Bilateral 04/08/2014   Procedure: BILATERAL  RADIOACTIVE SEED LOCALIZATION LUMPECTOMY ;  Surgeon: Autumn Messing III, MD;  Location: [facility];  Service: General;  Laterality: Bilateral;  . CESAREAN SECTION     x3  . CHOLECYSTECTOMY    . LAPAROTOMY N/A 02/13/2017   Procedure: MINI EXPLORATORY LAPAROTOMY;  Surgeon: Everitt Amber, MD;  Location: [facility];  Service: Gynecology;   Laterality: N/A;  . open heart surgery    . PORT A CATH REVISION  1/15   put in   . ROBOTIC ASSISTED TOTAL HYSTERECTOMY WITH BILATERAL SALPINGO OOPHERECTOMY Bilateral 02/13/2017   Procedure: XI ROBOTIC ASSISTED TOTAL HYSTERECTOMY WITH BILATERAL SALPINGO OOPHORECTOMY WITH LYSIS OF ADHESIONS, UTERUS GREATER THAN 250GRAMS;  Surgeon: Everitt Amber, MD;  Location: [facility];  Service: Gynecology;  Laterality: Bilateral;  . TUBAL LIGATION      Current Outpatient Rx  . Order #: [id number] Class: Historical Med  . Order #: [id number] Class: Historical Med  . Order #: [id number] Class: Normal  . Order #: [id number] Class: Print  . Order #: [id number] Class: Normal  . Order #: [id number] Class: Historical Med  . Order #: [id number] Class: Normal  . Order #: [id number] Class: Print  . Order #: [id number] Class: Print  . Order #: [id number] Class: Print  . Order #: [id number] Class: Print  . Order #: [id number] Class: Print  . Order #: [id number] Class: Normal  . Order #: [id number] Class: Normal  . Order #: [id number] Class: Normal  . Order #: [id number] Class: Historical Med  . Order #: [id number] Class: Normal    Allergies Codeine; Morphine and related; and Oxycodone hcl  Family History  Problem Relation Age of Onset  . Breast cancer Maternal Aunt     Social History Social History   Tobacco Use  . Smoking status: Former Smoker    Packs/day: 1.50    Years: 40.00    Pack years: 60.00    Types: Cigarettes    Last attempt to quit: 08/01/2012    Years since quitting: 5.0  . Smokeless tobacco: Never Used  Substance Use Topics  . Alcohol use: No  . Drug use: No    Review of Systems  All other systems negative except as documented in the HPI. All pertinent positives and negatives as reviewed in the HPI. ____________________________________________   PHYSICAL EXAM:  VITAL SIGNS: ED Triage Vitals  Enc Vitals Group     BP 08/07/17 1325 110/65     Pulse Rate 08/07/17 1325 94     Resp 08/07/17 1325 16     Temp  08/07/17 1325 98.8 F (37.1 C)     Temp Source 08/07/17 1325 Oral     SpO2 08/07/17 1325 97 %     Weight --      Height --      Head Circumference --      Peak Flow --      Pain Score 08/07/17 1343 10     Pain Loc --      Pain Edu? --      Excl. in [place]? --     Constitutional: Alert and oriented. Well appearing and in no acute distress. Eyes: Conjunctivae are normal. PERRL. EOMI. Head: Atraumatic. Nose: No congestion/rhinnorhea. Mouth/Throat: Mucous membranes are moist.  Oropharynx non-erythematous. Neck: No stridor.  No meningeal signs.   Cardiovascular: Normal rate, regular rhythm. Good peripheral circulation. Grossly normal heart sounds.   Respiratory: Normal respiratory effort.  No retractions. Lungs  CTAB. Gastrointestinal: Soft and nontender. No distention.  Musculoskeletal: No lower extremity tenderness nor edema. No gross deformities of extremities. Neurologic:  Normal speech and language. No gross focal neurologic deficits are appreciated.  Skin:  Skin is warm, dry and intact. No rash noted.   ____________________________________________   LABS (all labs ordered are listed, but only abnormal results are displayed)  Labs Reviewed  I-STAT CHEM 8, ED - Abnormal; Notable for the following components:      Result Value   Sodium 133 (*)    Chloride 94 (*)    Glucose, Bld 112 (*)    Hemoglobin 8.2 (*)    HCT 24.0 (*)    All other components within normal limits   ____________________________________________  INITIAL IMPRESSION / ASSESSMENT AND PLAN / ED COURSE  Hemoglobin level lower than a few days ago but this is consistent with her known anemia has not had levels any transfusion at this time and her oncologist to go to follow her up on Friday. No indication for further workup or testing at this time. Per his recommendations increased her fentanyl patch to 50 mcg daily, discontinue the Dilaudid tablets, started morphine immediate release per the patient's request.   Will follow up with palliative care and her oncologist as scheduled.     Pertinent labs & imaging results that were available during my care of the patient were reviewed by me and considered in my medical decision making (see chart for details).  ____________________________________________  FINAL CLINICAL IMPRESSION(S) / ED DIAGNOSES  Final diagnoses:  Pain     MEDICATIONS GIVEN DURING THIS VISIT:  Medications  morphine 4 MG/ML injection 6 mg (6 mg Intramuscular Given 08/07/17 1351)  morphine 2 MG/ML injection (2 mg Intramuscular Given 08/07/17 1351)  morphine 2 MG/ML injection (6 mg Intramuscular Given 08/07/17 1553)     NEW OUTPATIENT MEDICATIONS STARTED DURING THIS VISIT:  This SmartLink is deprecated. Use AVSMEDLIST instead to display the medication list for a patient.  Note:  This note was prepared with assistance of Dragon voice recognition software. Occasional wrong-word or sound-a-like substitutions may have occurred due to the inherent limitations of voice recognition software.   Merrily Pew, MD 08/07/17 2050

## 2017-08-08 ENCOUNTER — Telehealth: Payer: Self-pay | Admitting: Hematology and Oncology

## 2017-08-08 ENCOUNTER — Telehealth: Payer: Self-pay

## 2017-08-08 DIAGNOSIS — Z170 Estrogen receptor positive status [ER+]: Secondary | ICD-10-CM | POA: Insufficient documentation

## 2017-08-08 DIAGNOSIS — C7989 Secondary malignant neoplasm of other specified sites: Secondary | ICD-10-CM | POA: Insufficient documentation

## 2017-08-08 DIAGNOSIS — C7951 Secondary malignant neoplasm of bone: Secondary | ICD-10-CM | POA: Insufficient documentation

## 2017-08-08 DIAGNOSIS — M545 Low back pain: Secondary | ICD-10-CM | POA: Insufficient documentation

## 2017-08-08 DIAGNOSIS — D649 Anemia, unspecified: Secondary | ICD-10-CM | POA: Insufficient documentation

## 2017-08-08 DIAGNOSIS — C55 Malignant neoplasm of uterus, part unspecified: Secondary | ICD-10-CM | POA: Insufficient documentation

## 2017-08-08 DIAGNOSIS — D6959 Other secondary thrombocytopenia: Secondary | ICD-10-CM | POA: Insufficient documentation

## 2017-08-08 DIAGNOSIS — C7801 Secondary malignant neoplasm of right lung: Secondary | ICD-10-CM | POA: Insufficient documentation

## 2017-08-08 DIAGNOSIS — G893 Neoplasm related pain (acute) (chronic): Secondary | ICD-10-CM | POA: Insufficient documentation

## 2017-08-08 DIAGNOSIS — C50011 Malignant neoplasm of nipple and areola, right female breast: Secondary | ICD-10-CM | POA: Insufficient documentation

## 2017-08-08 DIAGNOSIS — Z923 Personal history of irradiation: Secondary | ICD-10-CM | POA: Insufficient documentation

## 2017-08-08 DIAGNOSIS — C50012 Malignant neoplasm of nipple and areola, left female breast: Secondary | ICD-10-CM | POA: Insufficient documentation

## 2017-08-08 DIAGNOSIS — Z9221 Personal history of antineoplastic chemotherapy: Secondary | ICD-10-CM | POA: Insufficient documentation

## 2017-08-08 NOTE — Telephone Encounter (Signed)
Called pt x2 but unable to get a hold off her. Phone does not accept VM at this time. Pt does not answer phone. Calling pt to see how she is doing and to remind her that her fentanyl script needs picked up. Pt scheduled to see MD on Friday 08/10/17

## 2017-08-08 NOTE — Telephone Encounter (Signed)
No los °

## 2017-08-09 ENCOUNTER — Telehealth: Payer: Self-pay

## 2017-08-09 ENCOUNTER — Other Ambulatory Visit: Payer: Self-pay

## 2017-08-09 MED ORDER — ZOLPIDEM TARTRATE 10 MG PO TABS: 10.0000 mg | ORAL_TABLET | Freq: Every evening | ORAL | 0 refills | Status: DC | PRN

## 2017-08-09 NOTE — Telephone Encounter (Signed)
Tried to contact patient multiple times and receive a "mailbox full" message but did finally reach patient at the [phone] number. She requested information for transportation by [facility] be faxed to them. Faxed [facility] [place] pt face sheet and calendar earlier and called them to make sure they arranged transportation for [place] appointments here.  They confirmed and I made patient aware also.  No other needs at this time. Sent Ambien refill also as pt requested

## 2017-08-09 NOTE — Assessment & Plan Note (Signed)
Bilateral breast cancers are usually diagnosed 2015 treated with surgery, radiation, anastrozole Metastatic breast cancer diagnosed 05/28/2015 as subpectoral mass, ER 5%, PR 0%, HER-2 negative, failed Xeloda and carboplatin and gemcitabine  Current Treatment: Olaparib Toxicities:  Severe Anemia:Transfusions PRN Pain Management: Morphine + Duragesic patch I discussed with her

## 2017-08-10 ENCOUNTER — Telehealth: Payer: Self-pay | Admitting: Hematology and Oncology

## 2017-08-10 ENCOUNTER — Inpatient Hospital Stay (HOSPITAL_BASED_OUTPATIENT_CLINIC_OR_DEPARTMENT_OTHER): Payer: Medicare Other | Admitting: Hematology and Oncology

## 2017-08-10 ENCOUNTER — Inpatient Hospital Stay: Payer: Medicare Other

## 2017-08-10 ENCOUNTER — Telehealth: Payer: Self-pay | Admitting: *Deleted

## 2017-08-10 ENCOUNTER — Encounter: Payer: Self-pay | Admitting: Hematology and Oncology

## 2017-08-10 ENCOUNTER — Other Ambulatory Visit: Payer: Self-pay

## 2017-08-10 DIAGNOSIS — C7989 Secondary malignant neoplasm of other specified sites: Secondary | ICD-10-CM | POA: Diagnosis not present

## 2017-08-10 DIAGNOSIS — Z923 Personal history of irradiation: Secondary | ICD-10-CM | POA: Diagnosis not present

## 2017-08-10 DIAGNOSIS — C7951 Secondary malignant neoplasm of bone: Secondary | ICD-10-CM | POA: Diagnosis not present

## 2017-08-10 DIAGNOSIS — C50011 Malignant neoplasm of nipple and areola, right female breast: Secondary | ICD-10-CM

## 2017-08-10 DIAGNOSIS — D6959 Other secondary thrombocytopenia: Secondary | ICD-10-CM | POA: Diagnosis not present

## 2017-08-10 DIAGNOSIS — Z9221 Personal history of antineoplastic chemotherapy: Secondary | ICD-10-CM

## 2017-08-10 DIAGNOSIS — C50012 Malignant neoplasm of nipple and areola, left female breast: Secondary | ICD-10-CM

## 2017-08-10 DIAGNOSIS — M545 Low back pain: Secondary | ICD-10-CM | POA: Diagnosis not present

## 2017-08-10 DIAGNOSIS — D649 Anemia, unspecified: Secondary | ICD-10-CM

## 2017-08-10 DIAGNOSIS — C50919 Malignant neoplasm of unspecified site of unspecified female breast: Secondary | ICD-10-CM

## 2017-08-10 DIAGNOSIS — C55 Malignant neoplasm of uterus, part unspecified: Secondary | ICD-10-CM | POA: Diagnosis not present

## 2017-08-10 DIAGNOSIS — G893 Neoplasm related pain (acute) (chronic): Secondary | ICD-10-CM

## 2017-08-10 DIAGNOSIS — Z170 Estrogen receptor positive status [ER+]: Secondary | ICD-10-CM | POA: Diagnosis not present

## 2017-08-10 DIAGNOSIS — C7801 Secondary malignant neoplasm of right lung: Secondary | ICD-10-CM | POA: Diagnosis not present

## 2017-08-10 LAB — COMPREHENSIVE METABOLIC PANEL
ALBUMIN: 2.3 g/dL — AB (ref 3.5–5.0)
ALT: 26 U/L (ref 0–55)
AST: 59 U/L — AB (ref 5–34)
Alkaline Phosphatase: 135 U/L (ref 40–150)
Anion gap: 9 (ref 3–11)
BUN: 23 mg/dL (ref 7–26)
CHLORIDE: 93 mmol/L — AB (ref 98–109)
CO2: 26 mmol/L (ref 22–29)
CREATININE: 0.77 mg/dL (ref 0.60–1.10)
Calcium: 9.2 mg/dL (ref 8.4–10.4)
GFR calc Af Amer: >60 mL/min (ref >60)
GFR calc non Af Amer: >60 mL/min (ref >60)
GLUCOSE: 122 mg/dL (ref 70–140)
POTASSIUM: 4.1 mmol/L (ref 3.3–4.7)
SODIUM: 128 mmol/L — AB (ref 136–145)
Total Bilirubin: 0.6 mg/dL (ref 0.2–1.2)
Total Protein: 6.7 g/dL (ref 6.4–8.3)

## 2017-08-10 LAB — CBC WITH DIFFERENTIAL/PLATELET
BASOS ABS: 0 10*3/uL (ref 0.0–0.1)
BASOS PCT: 0 %
EOS ABS: 0 10*3/uL (ref 0.0–0.5)
Eosinophils Relative: 0 %
HCT: 20.7 % — ABNORMAL LOW (ref 34.8–46.6)
Hemoglobin: 6.6 g/dL — CL (ref 11.6–15.9)
LYMPHS ABS: 0.9 10*3/uL (ref 0.9–3.3)
Lymphocytes Relative: 7 %
MCH: 25.1 pg (ref 25.1–34.0)
MCHC: 31.9 g/dL (ref 31.5–36.0)
MCV: 78.6 fL — ABNORMAL LOW (ref 79.5–101.0)
MONO ABS: 0.9 10*3/uL (ref 0.1–0.9)
Monocytes Relative: 7 %
NEUTROS ABS: 10.9 10*3/uL — AB (ref 1.5–6.5)
NEUTROS PCT: 86 %
PLATELETS: 398 10*3/uL (ref 145–400)
RBC: 2.64 MIL/uL — AB (ref 3.70–5.45)
RDW: 19.5 % — AB (ref 11.2–16.1)
WBC: 12.8 10*3/uL — ABNORMAL HIGH (ref 3.9–10.3)

## 2017-08-10 LAB — PREPARE RBC (CROSSMATCH)

## 2017-08-10 MED ORDER — SODIUM CHLORIDE 0.9% FLUSH
10.0000 mL | INTRAVENOUS | Status: DC | PRN
  Administered 2017-08-10: 10 mL via INTRAVENOUS
  Filled 2017-08-10: qty 10

## 2017-08-10 MED ORDER — MORPHINE SULFATE 30 MG PO TABS: 30.0000 mg | ORAL_TABLET | Freq: Four times a day (QID) | ORAL | 0 refills | Status: DC | PRN

## 2017-08-10 NOTE — [place] Notes (Signed)
Patient has appointment to receive blood products at 8:30 am in [facility] infusion dept, faxed [facility] information so pt can be picked up by their service. Pt is aware.

## 2017-08-10 NOTE — Telephone Encounter (Signed)
FYI Call from lobby Inquiring about wait for today's blood transfusion, should I wait or go home?  I'm so tired.  This is hard on me."     Infusion nurse awaiting return call from lab due to pt. antibodies.  Today's HGB = 6.6.  Patient said, "I'll stay. Call my [facility] transportation."   Voicemail message left for transportation.. Blood will be ready at 3:00 pm.  Infusion staff left information on today's cell with this information,  option to wait or return 8:00 am.   Patient seated in [facility] main lobby, in wheelchair, best friend by her side,  Denies receipt of voicemail.  "The phone is off.  I do not want to wait, call Joycelyn Schmid to pick me up.  I'll come back tomorrow." Driver en route for pick up per Joycelyn Schmid.  Patient rubbing right shoulder "My shoulder hurts.  I fell here in the other waiting area before I saw the doctor.  I really didn't fall.  My friend caught me.  I've never had a fall before.  My shoes are slick.  I stepped out of them trying to get up after I'd gone to the bathroom.  I'm okay."  No broken skin, bleeding, bruises noted at this time.  Occasional rubbing of right shoulder, upper arm.  Further evaluation needed with falls.  X-ray of right shoulder, upper arm you're rubbing.  I'll let you know of any orders or instructions.   Raised feet showing flat, mule slide shoes   "No I do not need any X-rays.  Check my blood pressure if you have to do something but that's all.  I'm okay.  I'm in a program I want to continue in.  Do not tell the doctor.  Tomorrow he'll want to talk with me.  I will not come in tomorrow for blood if you tell him."   No different information or further provided by friend when asked.  Suggested closed, tying, treaded soles, larger size, width, tennis shoes , high tops for ankle support are good shoe options. Proceeded to provider area.  Collaborative nurse given above information of un-witnessed fall event shared with this nurse, patient expressed no need for further  care at this time.

## 2017-08-10 NOTE — Progress Notes (Signed)
Patient Care Team: [name], MD as PCP - General (Internal Medicine) Rothbart, Cristopher Estimable, MD (Cardiology) [name], MD as Consulting Physician (Gastroenterology) Delice Bison, Charlestine Massed, NP as Nurse Practitioner (Hematology and Oncology) Nicholas Lose, MD as Consulting Physician (Hematology and Oncology) Kyung Rudd, MD as Consulting Physician (Radiation Oncology)  DIAGNOSIS:  Encounter Diagnoses  Name Primary?  . 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 Metastatic breast cancer ([place])     SUMMARY OF ONCOLOGIC HISTORY:   Bilateral breast cancer ([place])   07/23/2013 Mammogram    Bilateral breast masses. 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 N1 M0 stage IIB ER 100%, PR 0%, HER-2       06/17/2014 -  Radiation Therapy    Adjuvant radiation therapy      09/14/2014 -  Anti-estrogen oral therapy    Anastrozole 1 mg daily      05/28/2015  Imaging    CT scans: Enlarging subpectoral masses 3.1 x 3.5 cm, posteriorly lower density mass 4.5 x 2.1 cm, several right-sided lung nodules right lower lobe 1.4 cm, 3 other right lung nodules, 1.7 cm right iliac bone lesion      06/21/2015 Procedure    Left subpectoral mass biopsy: Invasive high-grade ductal carcinoma ER 5%, PR 0%, HER-2 negative ratio 1.29      09/01/2015 Imaging    Left chest wall mass increased in size 7.2 x 5.1 cm, multiple subcutaneous nodules, increase in the lung nodules both in number as well as in the size of existing nodules      09/10/2015 -  Chemotherapy    Carboplatin, gemcitabine days 1 and 8 q 3 weeks, carboplatin discontinued for neuropathy (treatment break from 01/20/2016 to 03/24/2016); Added Carboplatin back 04/14/16 (for progression)      01/26/2016 Imaging    Marked improvement in size of lung nodules with many of the nodules resolved index right lower lobe nodule 1.9 x 1.8 cm is now 0.7 x 0.6 cm, reduction in the size of left breast mass 7.2 cm down to 2.6 cm, enlargement in the hypodense mass in the uterus      04/06/2016 Imaging    CT chest: Interval progression of pre-existing lung nodules new lung metastases (84m, 128m 2.9 cm, 1.6 cm), interval progression of disease in the left breast 4.2 cm (was 2.6  cm), additional nodules 2.3 cm and 3.2 cm; CT head: scalp mass 1.9 cm      06/27/2016 Imaging    Ct chest: Stable lesions in lt breast deep to Left pectoralis, cystic lesion loc ant in left breast inc compared to previous; decrease in lung nodules (36m to 3 mm; 12 mm to 7 mm, RML nodule 2.9 cm to 1.8 cm, RLL 117mto 7 mm)      11/14/2016 Imaging    CT CAP:RP and retrocrural lymphadenopathy, large uterine fibroids with necrosis; MRI Abd: RP lymphadenopathy CT chest 11/20/16: Worsening bulky conglomeration of mediastinal and right hilar lymph nodes, lung nodules subcentimeter size slight increase, left breast mass was 3.7 x 5.8 cm now 5.4 x 7.4 cm       12/08/2016 - 04/06/2017 Chemotherapy    Palliative chemotherapy with [place] days 1 and 8 every 3 weeks stopped due to peripheral neuropathy       04/30/2017 - 05/18/2017 Radiation Therapy    Palliative radiation therapy to the scalp and the left chest wall lesion      06/11/2017 Imaging    Progression noted with new and progressing pulmonary nodules, increasing size of periaortic mass, increasing iliac adenopathy with mass lesion difficult to separate from adjacent psoas muscle, new 1511module in the LUQ anterior to the colon.        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  Because she had BRCA mutation she is currently on all operative therapy.  She appears to be tolerating the pill extremely well.  She has been on it for the past 1 week.  She has a hemoglobin of 6.6 today and will require 2 units of blood today.  She came in without using a wheelchair by walking herself.  She tells me that she is trying to eat better.  We had previously discussed extensively about hospice care.  She was at one point enrolled in hospice but she decided to come out of it.  She wants to pursue a last option treatment and therefore we are giving her olaparib.  REVIEW OF SYSTEMS:   Constitutional: Denies fevers, chills or abnormal weight loss Eyes: Denies blurriness of vision Ears, nose, mouth, throat, and face: Denies mucositis or sore throat, lesion on the scalp is bleeding intermittently Respiratory: Denies cough, dyspnea or wheezes, left chest wall tumor Cardiovascular: Denies palpitation, chest  discomfort Gastrointestinal:  Denies nausea, heartburn or change in bowel habits Skin: Denies abnormal skin rashes Lymphatics: Denies new lymphadenopathy or easy bruising Neurological: Neuropathy in hands and feet Behavioral/Psych: Mood is stable, no new changes  Extremities: No lower extremity edema All other systems were reviewed with the patient and are negative.  I have reviewed the past medical history, past surgical history, social history and family history with the patient and they are unchanged from previous note.  ALLERGIES:  is allergic to codeine; morphine and related; and oxycodone hcl.  MEDICATIONS:  Current Outpatient Medications  Medication Sig Dispense Refill  . acetaminophen (TYLENOL) 500 MG tablet Take 500 mg by mouth 3 (three) times daily as needed (BREAKTHROUGH PAIN).     .Marland Kitchen  Calcium Carb-Cholecalciferol (CALCIUM 500/D) 500-400 MG-UNIT CHEW Chew 1 tablet by mouth daily.    Marland Kitchen docusate sodium (COLACE) 100 MG capsule Take 1 capsule (100 mg total) by mouth 2 (two) times daily. (Patient taking differently: Take 100 mg by mouth daily. ) 10 capsule 0  . fentaNYL (DURAGESIC - DOSED MCG/HR) 50 MCG/HR Place 1 patch (50 mcg total) onto the skin every 3 (three) days. 5 patch 0  . gabapentin (NEURONTIN) 300 MG capsule TAKE 2 CAPSULES (600 MG TOTAL) BY MOUTH 3 (THREE) TIMES DAILY. 180 capsule 2  . Glycerin-Hypromellose-PEG 400 (VISINE PURE TEARS OP) Place 2 drops into both eyes daily as needed (DRY EYES).    Marland Kitchen lactose free nutrition (BOOST) LIQD Take 237 mLs by mouth 3 (three) times daily between meals. 21330 mL 0  . Lactulose 20 GM/30ML SOLN Take 30 mLs (20 g total) by mouth 2 (two) times daily as needed. For Constipation 473 mL 1  . lidocaine (LIDODERM) 5 % Place 1 patch onto the skin daily. Remove & Discard patch within 12 hours or as directed by MD 30 patch 1  . LORazepam (ATIVAN) 1 MG tablet Take 1 tablet (1 mg total) by mouth at bedtime. 10 tablet 0  . morphine (MSIR) 30 MG  tablet Take 1 tablet (30 mg total) by mouth every 6 (six) hours as needed for severe pain. 120 tablet 0  . ondansetron (ZOFRAN) 4 MG tablet Take 1 tablet (4 mg total) by mouth every 6 (six) hours. 12 tablet 0  . polyethylene glycol (MIRALAX / GLYCOLAX) packet Take 17 g by mouth 2 (two) times daily. 14 each 0  . prochlorperazine (COMPAZINE) 10 MG tablet Take 1 tablet (10 mg total) by mouth every 6 (six) hours as needed (Nausea or vomiting). (Patient not taking: Reported on 08/02/2017) 30 tablet 1  . senna (SENOKOT) 8.6 MG TABS tablet Take 1 tablet (8.6 mg total) by mouth 2 (two) times daily. 120 each 0  . SUPER B COMPLEX/C PO Take 1 tablet by mouth daily.    . traZODone (DESYREL) 100 MG tablet Take 1 tablet (100 mg total) by mouth at bedtime as needed for sleep. 30 tablet 1  . zolpidem (AMBIEN) 10 MG tablet Take 1 tablet (10 mg total) by mouth at bedtime as needed for sleep. 30 tablet 0   No current facility-administered medications for this visit.     PHYSICAL EXAMINATION: ECOG PERFORMANCE STATUS: 2 - Symptomatic, <50% confined to bed  Vitals:   08/10/17 1048  BP: 97/65  Pulse: (!) 126  Resp: (!) 22  Temp: 97.9 F (36.6 C)  SpO2: 99%   Filed Weights   08/10/17 1048  Weight: 102 lb 1.6 oz (46.3 kg)    GENERAL:alert, no distress and comfortable SKIN: skin color, texture, turgor are normal, no rashes or significant lesions, scalp lesion intermittently bleeds EYES: normal, Conjunctiva are pink and non-injected, sclera clear OROPHARYNX:no exudate, no erythema and lips, buccal mucosa, and tongue normal  NECK: supple, thyroid normal size, non-tender, without nodularity LYMPH:  no palpable lymphadenopathy in the cervical, axillary or inguinal LUNGS: clear to auscultation and percussion with normal breathing effort, left lower chest wall tumor appears to be intact HEART: regular rate & rhythm and no murmurs and no lower extremity edema ABDOMEN:abdomen soft, non-tender and normal bowel  sounds MUSCULOSKELETAL:no cyanosis of digits and no clubbing  NEURO: alert & oriented x 3 with fluent speech, 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 EXTREMITIES: No lower extremity edema  LABORATORY DATA:  I have reviewed the data as listed CMP Latest Ref Rng & Units 08/10/2017 08/07/2017 08/03/2017  Glucose 70 - 140 mg/dL 122 112(H) 104(H)  BUN 7 - 26 mg/dL 23 12 13   Creatinine 0.60 - 1.10 mg/dL 0.77 0.60 0.56  Sodium 136 - 145 mmol/L 128(L) 133(L) 133(L)  Potassium 3.3 - 4.7 mmol/L 4.1 4.0 4.1  Chloride 98 - 109 mmol/L 93(L) 94(L) 99(L)  CO2 22 - 29 mmol/L 26 - 27  Calcium 8.4 - 10.4 mg/dL 9.2 - 8.7(L)  Total Protein 6.4 - 8.3 g/dL 6.7 - -  Total Bilirubin 0.2 - 1.2 mg/dL 0.6 - -  Alkaline Phos 40 - 150 U/L 135 - -  AST 5 - 34 U/L 59(H) - -  ALT 0 - 55 U/L 26 - -    Lab Results  Component Value Date   WBC 12.8 (H) 08/10/2017   HGB 6.6 (LL) 08/10/2017   HCT 20.7 (L) 08/10/2017   MCV 78.6 (L) 08/10/2017   PLT 398 08/10/2017   NEUTROABS 10.9 (H) 08/10/2017    ASSESSMENT & PLAN:  Bilateral breast cancer ([place]) Bilateral breast cancers are usually diagnosed 2015 treated with surgery, radiation, anastrozole Metastatic breast cancer diagnosed 05/28/2015 as subpectoral mass, ER 5%, PR 0%, HER-2 negative, failed Xeloda and carboplatin and gemcitabine  Current Treatment: Olaparib Toxicities: Denies any side effects olaparib  Severe Anemia:Transfusions today and weekly she will come for this. Pain Management: Morphine + Duragesic patch.  we prescribed her Lidoderm patches.  I instructed her to apply them if the pain gets worse.  We will increase her Duragesic dose to 50 mcg.  If the patient declines and her performance status then we will place her in hospice care. She will come weekly for transfusions and follow-ups with me. I spent 25 minutes talking to the patient of which more than half was spent in counseling and coordination of care.  Orders Placed This Encounter  Procedures   . Prepare RBC    Standing Status:   Standing    Number of Occurrences:   1    Order Specific Question:   # of Units    Answer:   2 units    Order Specific Question:   Transfusion Indications    Answer:   Symptomatic Anemia    Order Specific Question:   If emergent release call blood bank    Answer:   Not emergent release   The patient has a good understanding of the overall plan. she agrees with it. she will call with any problems that may develop before the next visit here.   Harriette Ohara, MD 08/10/17

## 2017-08-10 NOTE — Progress Notes (Signed)
Received rejection message from [facility] regarding fentanyl refill too soon.  Called [facility]aitlyn) who states she is unsure why it was sent before refill date. She will schedule to be refilled on 08/21/17.

## 2017-08-10 NOTE — Telephone Encounter (Signed)
Called pt to advise of appt time chg on 1/25 from 11am to 12.45pm and from [facility] to [facility] due to md pal.  I am unable to lvm as pt's cell phn vm is full and pt's home phn is not in service. Msg to infusion to give pt revised calendar at 1/12 appt.

## 2017-08-10 NOTE — Progress Notes (Signed)
Received call from Lab with critical lab value of 6.6 for Hemoglobin. Printed copy of result for MD and notified Dr Lindi Adie.

## 2017-08-10 NOTE — Progress Notes (Signed)
Appt for blood moved until tomorrow due to patient requiring blood from another facility and it will not be ready for several hours. Patient's scat transportation already made aware and a faxed letter of medical necessity sent to them today and patient agreed, she wanted to go home now.

## 2017-08-10 NOTE — Progress Notes (Signed)
Pt's hemoglobin is 6.6, received orders from Dr Lindi Adie for Transfuse 2 units of PRBCs, notified lab, blood bank, and transfusion RN.

## 2017-08-11 ENCOUNTER — Inpatient Hospital Stay: Payer: Medicare Other

## 2017-08-11 DIAGNOSIS — C50919 Malignant neoplasm of unspecified site of unspecified female breast: Secondary | ICD-10-CM

## 2017-08-11 DIAGNOSIS — C50011 Malignant neoplasm of nipple and areola, right female breast: Secondary | ICD-10-CM | POA: Diagnosis not present

## 2017-08-11 MED ORDER — SODIUM CHLORIDE 0.9% FLUSH
10.0000 mL | INTRAVENOUS | Status: AC | PRN
  Administered 2017-08-11: 10 mL
  Filled 2017-08-11: qty 10

## 2017-08-11 MED ORDER — DIPHENHYDRAMINE HCL 25 MG PO CAPS
ORAL_CAPSULE | ORAL | Status: AC
  Filled 2017-08-11: qty 1

## 2017-08-11 MED ORDER — SODIUM CHLORIDE 0.9% FLUSH
3.0000 mL | INTRAVENOUS | Status: DC | PRN
  Filled 2017-08-11: qty 10

## 2017-08-11 MED ORDER — ACETAMINOPHEN 325 MG PO TABS
ORAL_TABLET | ORAL | Status: AC
  Filled 2017-08-11: qty 2

## 2017-08-11 MED ORDER — HEPARIN SOD (PORK) LOCK FLUSH 100 UNIT/ML IV SOLN
500.0000 [IU] | Freq: Every day | INTRAVENOUS | Status: AC | PRN
  Administered 2017-08-11: 500 [IU]
  Filled 2017-08-11: qty 5

## 2017-08-11 MED ORDER — HEPARIN SOD (PORK) LOCK FLUSH 100 UNIT/ML IV SOLN
250.0000 [IU] | INTRAVENOUS | Status: DC | PRN
  Filled 2017-08-11: qty 5

## 2017-08-11 MED ORDER — DIPHENHYDRAMINE HCL 25 MG PO CAPS
25.0000 mg | ORAL_CAPSULE | Freq: Once | ORAL | Status: AC
  Administered 2017-08-11: 25 mg via ORAL

## 2017-08-11 MED ORDER — ACETAMINOPHEN 325 MG PO TABS
650.0000 mg | ORAL_TABLET | Freq: Once | ORAL | Status: AC
  Administered 2017-08-11: 650 mg via ORAL

## 2017-08-11 MED ORDER — SODIUM CHLORIDE 0.9 % IV SOLN
250.0000 mL | Freq: Once | INTRAVENOUS | Status: AC
  Administered 2017-08-11: 250 mL via INTRAVENOUS

## 2017-08-11 NOTE — Patient Instructions (Signed)

## 2017-08-12 LAB — BPAM RBC
BLOOD PRODUCT EXPIRATION DATE: 201901312359
Blood Product Expiration Date: 201901302359
ISSUE DATE / TIME: 201901120845
ISSUE DATE / TIME: 201901120845
Unit Type and Rh: 1700
Unit Type and Rh: 7300

## 2017-08-12 LAB — TYPE AND SCREEN
ABO/RH(D): AB POS
Antibody Screen: NEGATIVE
DONOR AG TYPE: NEGATIVE
Donor AG Type: NEGATIVE
UNIT DIVISION: 0
Unit division: 0

## 2017-08-13 ENCOUNTER — Encounter: Payer: Self-pay | Admitting: Genetics

## 2017-08-17 ENCOUNTER — Other Ambulatory Visit: Payer: Self-pay | Admitting: *Deleted

## 2017-08-17 ENCOUNTER — Other Ambulatory Visit: Payer: Self-pay

## 2017-08-17 ENCOUNTER — Telehealth: Payer: Self-pay | Admitting: *Deleted

## 2017-08-17 ENCOUNTER — Inpatient Hospital Stay: Payer: Medicare Other

## 2017-08-17 ENCOUNTER — Inpatient Hospital Stay (HOSPITAL_BASED_OUTPATIENT_CLINIC_OR_DEPARTMENT_OTHER): Payer: Medicare Other | Admitting: Hematology and Oncology

## 2017-08-17 VITALS — BP 107/71 | HR 99 | Temp 98.9°F | Resp 18 | Ht 62.0 in | Wt 108.8 lb

## 2017-08-17 DIAGNOSIS — D649 Anemia, unspecified: Secondary | ICD-10-CM | POA: Diagnosis not present

## 2017-08-17 DIAGNOSIS — C7951 Secondary malignant neoplasm of bone: Secondary | ICD-10-CM | POA: Diagnosis not present

## 2017-08-17 DIAGNOSIS — C7801 Secondary malignant neoplasm of right lung: Secondary | ICD-10-CM

## 2017-08-17 DIAGNOSIS — C50012 Malignant neoplasm of nipple and areola, left female breast: Secondary | ICD-10-CM

## 2017-08-17 DIAGNOSIS — C50011 Malignant neoplasm of nipple and areola, right female breast: Secondary | ICD-10-CM

## 2017-08-17 DIAGNOSIS — G893 Neoplasm related pain (acute) (chronic): Secondary | ICD-10-CM

## 2017-08-17 DIAGNOSIS — T451X5A Adverse effect of antineoplastic and immunosuppressive drugs, initial encounter: Secondary | ICD-10-CM

## 2017-08-17 DIAGNOSIS — C50919 Malignant neoplasm of unspecified site of unspecified female breast: Secondary | ICD-10-CM

## 2017-08-17 DIAGNOSIS — C55 Malignant neoplasm of uterus, part unspecified: Secondary | ICD-10-CM | POA: Diagnosis not present

## 2017-08-17 DIAGNOSIS — D6959 Other secondary thrombocytopenia: Secondary | ICD-10-CM

## 2017-08-17 LAB — COMPREHENSIVE METABOLIC PANEL
ALK PHOS: 89 U/L (ref 40–150)
ALT: 15 U/L (ref 0–55)
AST: 32 U/L (ref 5–34)
Albumin: 2.4 g/dL — ABNORMAL LOW (ref 3.5–5.0)
Anion gap: 9 (ref 3–11)
BILIRUBIN TOTAL: 0.5 mg/dL (ref 0.2–1.2)
BUN: 17 mg/dL (ref 7–26)
CALCIUM: 9.7 mg/dL (ref 8.4–10.4)
CO2: 29 mmol/L (ref 22–29)
Chloride: 94 mmol/L — ABNORMAL LOW (ref 98–109)
Creatinine, Ser: 0.79 mg/dL (ref 0.60–1.10)
GFR calc Af Amer: >60 mL/min (ref >60)
GFR calc non Af Amer: >60 mL/min (ref >60)
GLUCOSE: 103 mg/dL (ref 70–140)
Potassium: 4.2 mmol/L (ref 3.3–4.7)
Sodium: 132 mmol/L — ABNORMAL LOW (ref 136–145)
TOTAL PROTEIN: 6.5 g/dL (ref 6.4–8.3)

## 2017-08-17 LAB — CBC WITH DIFFERENTIAL/PLATELET
BASOS ABS: 0 10*3/uL (ref 0.0–0.1)
Basophils Relative: 0 %
Eosinophils Absolute: 0.1 10*3/uL (ref 0.0–0.5)
Eosinophils Relative: 1 %
HEMATOCRIT: 23.5 % — AB (ref 34.8–46.6)
Hemoglobin: 7.6 g/dL — ABNORMAL LOW (ref 11.6–15.9)
LYMPHS PCT: 8 %
Lymphs Abs: 0.8 10*3/uL — ABNORMAL LOW (ref 0.9–3.3)
MCH: 25.8 pg (ref 25.1–34.0)
MCHC: 32.6 g/dL (ref 31.5–36.0)
MCV: 79.3 fL — AB (ref 79.5–101.0)
MONO ABS: 0.6 10*3/uL (ref 0.1–0.9)
Monocytes Relative: 6 %
NEUTROS ABS: 8.6 10*3/uL — AB (ref 1.5–6.5)
Neutrophils Relative %: 85 %
Platelets: 306 10*3/uL (ref 145–400)
RBC: 2.96 MIL/uL — AB (ref 3.70–5.45)
RDW: 19.9 % — ABNORMAL HIGH (ref 11.2–16.1)
WBC: 10.1 10*3/uL (ref 3.9–10.3)

## 2017-08-17 LAB — PREPARE RBC (CROSSMATCH)

## 2017-08-17 LAB — SAMPLE TO BLOOD BANK

## 2017-08-17 LAB — TSH: TSH: 0.951 u[IU]/mL (ref 0.308–3.960)

## 2017-08-17 MED ORDER — SODIUM CHLORIDE 0.9% FLUSH
3.0000 mL | INTRAVENOUS | Status: AC | PRN
  Administered 2017-08-17: 10 mL
  Filled 2017-08-17: qty 10

## 2017-08-17 MED ORDER — ACETAMINOPHEN 325 MG PO TABS: 650.0000 mg | ORAL_TABLET | Freq: Once | ORAL | Status: DC

## 2017-08-17 MED ORDER — FUROSEMIDE 20 MG PO TABS: 20.0000 mg | ORAL_TABLET | Freq: Every day | ORAL | 0 refills | Status: DC | PRN

## 2017-08-17 MED ORDER — HEPARIN SOD (PORK) LOCK FLUSH 100 UNIT/ML IV SOLN
500.0000 [IU] | Freq: Every day | INTRAVENOUS | Status: AC | PRN
  Administered 2017-08-17: 500 [IU]
  Filled 2017-08-17: qty 5

## 2017-08-17 MED ORDER — SODIUM CHLORIDE 0.9% FLUSH
10.0000 mL | INTRAVENOUS | Status: DC | PRN
  Administered 2017-08-17: 10 mL via INTRAVENOUS
  Filled 2017-08-17: qty 10

## 2017-08-17 MED ORDER — DIPHENHYDRAMINE HCL 25 MG PO CAPS: 25.0000 mg | ORAL_CAPSULE | Freq: Once | ORAL | Status: DC

## 2017-08-17 NOTE — Progress Notes (Signed)
Pt presented for blood transfusion.   Blood did not arrive in time d/t being transported from [place]. Pt to return to infusion on 08/18/2017 for transfusion.

## 2017-08-17 NOTE — Progress Notes (Signed)
Patient Care Team: [name], MD as PCP - General (Internal Medicine) Rothbart, Cristopher Estimable, MD (Cardiology) [name], MD as Consulting Physician (Gastroenterology) Delice Bison, Charlestine Massed, NP as Nurse Practitioner (Hematology and Oncology) Nicholas Lose, MD as Consulting Physician (Hematology and Oncology) Kyung Rudd, MD as Consulting Physician (Radiation Oncology)  DIAGNOSIS:  Encounter Diagnoses  Name Primary?  . Metastatic breast cancer ([place]) Yes  . Bone metastases ([place])   . 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 Chemotherapy-induced thrombocytopenia   . 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 Left: IDC grade 3 ER positive PR negative HER-2/Navarro negative Ki-67 80% T2 N1 on left T2 NX right       09/15/2013 - 02/13/2014 Neo-Adjuvant Chemotherapy    5 fluorouracil, epirubicin and cyclophosphamide with Neulasta and 6 cycles followed by weekly Taxol started 12/16/2013 x8 weeks stopped 02/03/2014 for neuropathy      02/19/2014 Breast MRI    Right breast: 1.9 x 0.4 x 0.8 cm (previously 1.9 x 1.1 x 1.1 cm); left breast 2.5 x 2 x 1.7 cm (previously 2.6 x 2.2 x 2.3 cm) other non-mass enhancement result, no residual axillary lymph nodes      04/08/2014 Surgery    Left lumpectomy: IDC grade 3; 2.1 cm, high-grade DCIS (margin 0.1 cm), 16 lymph nodes negative T2, N0, M0 stage II A ER 6% PR 0% HER.: Right lumpectomy: IDC grade 3; 1.8 cm with high-grade DCIS 1/11 ln positive T1 C. N1 M0 stage IIB ER 100%, PR 0%, HER-2       06/17/2014 -  Radiation  Therapy    Adjuvant radiation therapy      09/14/2014 -  Anti-estrogen oral therapy    Anastrozole 1 mg daily      05/28/2015 Imaging    CT scans: Enlarging subpectoral masses 3.1 x 3.5 cm, posteriorly lower density mass 4.5 x 2.1 cm, several right-sided lung nodules right lower lobe 1.4 cm, 3 other right lung nodules, 1.7 cm right iliac bone lesion      06/21/2015 Procedure    Left subpectoral mass biopsy: Invasive high-grade ductal carcinoma ER 5%, PR 0%, HER-2 negative ratio 1.29      09/01/2015 Imaging    Left chest wall mass increased in size 7.2 x 5.1 cm, multiple subcutaneous nodules, increase in the lung nodules both in number as well as in the size of existing nodules      09/10/2015 -  Chemotherapy    Carboplatin, gemcitabine days 1 and 8 q 3 weeks, carboplatin discontinued for neuropathy (treatment break from 01/20/2016 to 03/24/2016); Added Carboplatin back 04/14/16 (for progression)      01/26/2016 Imaging    Marked improvement in size of lung nodules with many of the nodules resolved index right lower lobe nodule 1.9 x 1.8 cm is now 0.7 x 0.6 cm, reduction in the size of left breast mass 7.2 cm down to 2.6 cm, enlargement in the hypodense mass in the uterus      04/06/2016 Imaging    CT chest: Interval progression of pre-existing lung nodules  new lung metastases (70m, 140m 2.9 cm, 1.6 cm), interval progression of disease in the left breast 4.2 cm (was 2.6 cm), additional nodules 2.3 cm and 3.2 cm; CT head: scalp mass 1.9 cm      06/27/2016 Imaging    Ct chest: Stable lesions in lt breast deep to Left pectoralis, cystic lesion loc ant in left breast inc compared to previous; decrease in lung nodules (34m334mo 3 mm; 12 mm to 7 mm, RML nodule 2.9 cm to 1.8 cm, RLL 33m3m 7 mm)      11/14/2016 Imaging    CT CAP:RP and retrocrural lymphadenopathy, large uterine fibroids with necrosis; MRI Abd: RP lymphadenopathy CT chest 11/20/16: Worsening bulky conglomeration of mediastinal and  right hilar lymph nodes, lung nodules subcentimeter size slight increase, left breast mass was 3.7 x 5.8 cm now 5.4 x 7.4 cm      12/08/2016 - 04/06/2017 Chemotherapy    Palliative chemotherapy with [place] days 1 and 8 every 3 weeks stopped due to peripheral neuropathy       04/30/2017 - 05/18/2017 Radiation Therapy    Palliative radiation therapy to the scalp and the left chest wall lesion      06/11/2017 Imaging    Progression noted with new and progressing pulmonary nodules, increasing size of periaortic mass, increasing iliac adenopathy with mass lesion difficult to separate from adjacent psoas muscle, new 15mm54mule in the LUQ anterior to the colon.        06/25/2017 Miscellaneous    Olaparib (patient is BRCA mutation positive)       Endometrial cancer (HCC) [place]/17/2018 Surgery    Hysterectomy with BSO: HG Serous carcinoma 7.7 cm (>50% Myometrial inv)involving Uterus and Bilateral ovaries, Omentum: No carcinoma; T3a (FIGO stage IIIa)       CHIEF COMPLIANT: Weekly follow-up for blood transfusion and on olaparib  INTERVAL HISTORY: Paula Navarro very frail 66 ye49 old lady with above-mentioned history metastatic breast cancer with a very large chest wall mass and a scalp mass along with additional metastatic sites who is currently on olaparib therapy because she has a BRCA mutation.  She is tolerating the olaparib extremely well without any nausea vomiting or diarrhea.  She has been profoundly anemic and has required weekly blood transfusions.  She required 2 units of blood to be given last Saturday and she is here today to recheck her blood work and see if she needs more blood.  She tells me that she is eating better and has gained a few pounds.  She still is extremely weak and frail.  REVIEW OF SYSTEMS:   Constitutional: Denies fevers, chills or abnormal weight loss Eyes: Denies blurriness of vision, large scalp lesion Ears, nose, mouth, throat, and face: Denies mucositis or  sore throat Respiratory: Large left chest wall mass Cardiovascular: Denies palpitation, chest discomfort Gastrointestinal:  Denies nausea, heartburn or change in bowel habits Skin: Denies abnormal skin rashes Lymphatics: Denies new lymphadenopathy or easy bruising Neurological:Denies numbness, tingling or new weaknesses Behavioral/Psych: Mood is stable, no new changes  Extremities: Profound left arm lymphedema All other systems were reviewed with the patient and are negative.  I have reviewed the past medical history, past surgical history, social history and family history with the patient and they are unchanged from previous note.  ALLERGIES:  is allergic to codeine; morphine and related; and oxycodone hcl.  MEDICATIONS:  Current Outpatient Medications  Medication Sig Dispense Refill  . acetaminophen (TYLENOL) 500 MG tablet Take  500 mg by mouth 3 (three) times daily as needed (BREAKTHROUGH PAIN).     . Calcium Carb-Cholecalciferol (CALCIUM 500/D) 500-400 MG-UNIT CHEW Chew 1 tablet by mouth daily.    Marland Kitchen docusate sodium (COLACE) 100 MG capsule Take 1 capsule (100 mg total) by mouth 2 (two) times daily. (Patient taking differently: Take 100 mg by mouth daily. ) 10 capsule 0  . fentaNYL (DURAGESIC - DOSED MCG/HR) 50 MCG/HR Place 1 patch (50 mcg total) onto the skin every 3 (three) days. 5 patch 0  . furosemide (LASIX) 20 MG tablet Take 1 tablet (20 mg total) by mouth daily as needed. For leg swelling 30 tablet 0  . gabapentin (NEURONTIN) 300 MG capsule TAKE 2 CAPSULES (600 MG TOTAL) BY MOUTH 3 (THREE) TIMES DAILY. 180 capsule 2  . lactose free nutrition (BOOST) LIQD Take 237 mLs by mouth 3 (three) times daily between meals. 21330 mL 0  . morphine (MSIR) 30 MG tablet Take 1 tablet (30 mg total) by mouth every 6 (six) hours as needed for severe pain. 120 tablet 0  . ondansetron (ZOFRAN) 4 MG tablet Take 1 tablet (4 mg total) by mouth every 6 (six) hours. 12 tablet 0  . polyethylene glycol  (MIRALAX / GLYCOLAX) packet Take 17 g by mouth 2 (two) times daily. 14 each 0  . SUPER B COMPLEX/C PO Take 1 tablet by mouth daily.    Marland Kitchen zolpidem (AMBIEN) 10 MG tablet Take 1 tablet (10 mg total) by mouth at bedtime as needed for sleep. 30 tablet 0   No current facility-administered medications for this visit.    Facility-Administered Medications Ordered in Other Visits  Medication Dose Route Frequency Provider Last Rate Last Dose  . acetaminophen (TYLENOL) tablet 650 mg  650 mg Oral Once Nicholas Lose, MD      . diphenhydrAMINE (BENADRYL) capsule 25 mg  25 mg Oral Once Nicholas Lose, MD      . heparin lock flush 100 unit/mL  500 Units Intracatheter Daily PRN Nicholas Lose, MD      . sodium chloride flush (NS) 0.9 % injection 3 mL  3 mL Intracatheter PRN Nicholas Lose, MD        PHYSICAL EXAMINATION: ECOG PERFORMANCE STATUS: 3 - Symptomatic, >50% confined to bed  Vitals:   08/17/17 1110  BP: 107/71  Pulse: 99  Resp: 18  Temp: 98.9 F (37.2 C)  SpO2: 98%   Filed Weights   08/17/17 1110  Weight: 108 lb 12.8 oz (49.4 kg)    GENERAL:alert, no distress and comfortable SKIN: skin color, texture, turgor are normal, no rashes or significant lesions EYES: normal, Conjunctiva are pink and non-injected, sclera clear OROPHARYNX:no exudate, no erythema and lips, buccal mucosa, and tongue normal  NECK: supple, thyroid normal size, non-tender, without nodularity, large scalp lesion patient tells me that is getting smaller LYMPH:  no palpable lymphadenopathy in the cervical, axillary or inguinal LUNGS: clear to auscultation and percussion with normal breathing effort HEART: regular rate & rhythm and no murmurs and no lower extremity edema ABDOMEN:abdomen soft, non-tender and normal bowel sounds MUSCULOSKELETAL:no cyanosis of digits and no clubbing  NEURO: alert & oriented x 3 with fluent speech, no focal motor/sensory deficits EXTREMITIES: Left arm lymphedema BREAST: Profoundly enlarged  left chest wall mass (exam performed in the presence of a chaperone)  LABORATORY DATA:  I have reviewed the data as listed CMP Latest Ref Rng & Units 08/17/2017 08/10/2017 08/07/2017  Glucose 70 - 140 mg/dL 103 122 112(H)  BUN 7 - 26 mg/dL 17 23 12   Creatinine 0.60 - 1.10 mg/dL 0.79 0.77 0.60  Sodium 136 - 145 mmol/L 132(L) 128(L) 133(L)  Potassium 3.3 - 4.7 mmol/L 4.2 4.1 4.0  Chloride 98 - 109 mmol/L 94(L) 93(L) 94(L)  CO2 22 - 29 mmol/L 29 26 -  Calcium 8.4 - 10.4 mg/dL 9.7 9.2 -  Total Protein 6.4 - 8.3 g/dL 6.5 6.7 -  Total Bilirubin 0.2 - 1.2 mg/dL 0.5 0.6 -  Alkaline Phos 40 - 150 U/L 89 135 -  AST 5 - 34 U/L 32 59(H) -  ALT 0 - 55 U/L 15 26 -    Lab Results  Component Value Date   WBC 10.1 08/17/2017   HGB 7.6 (L) 08/17/2017   HCT 23.5 (L) 08/17/2017   MCV 79.3 (L) 08/17/2017   PLT 306 08/17/2017   NEUTROABS 8.6 (H) 08/17/2017    ASSESSMENT & PLAN:  Bilateral breast cancer ([place]) Bilateral breast cancers are usually diagnosed 2015 treated with surgery, radiation, anastrozole Metastatic breast cancer diagnosed 05/28/2015 as subpectoral mass, ER 5%, PR 0%, HER-2 negative, failed Xeloda and carboplatin and gemcitabine  Current Treatment: Olaparib Toxicities: Denies any side effects olaparib  Severe Anemia:Transfusions today and weekly she will come for this.  Today she will require 1 unit of PRBC. Pain Management: Morphine + Duragesic patch.   Return to clinic every week for blood transfusion and for follow-up and labs. Patient understands fully well that if she is too frail to come to our office or if she requires wheelchair assistance because she has profound weakness, we may decide to stop olaparib and consider hospice care.   I spent 25 minutes talking to the patient of which more than half was spent in counseling and coordination of care.  Orders Placed This Encounter  Procedures  . Prepare RBC    Standing Status:   Standing    Number of Occurrences:   1     Order Specific Question:   # of Units    Answer:   1 unit    Order Specific Question:   Transfusion Indications    Answer:   Symptomatic Anemia    Order Specific Question:   If emergent release call blood bank    Answer:   [facility] [phone]   The patient has a good understanding of the overall plan. she agrees with it. she will call with any problems that may develop before the next visit here.   Harriette Ohara, MD 08/17/17

## 2017-08-17 NOTE — Patient Instructions (Signed)
Blood Transfusion, Care After This sheet gives you information about how to care for yourself after your procedure. Your doctor may also give you more specific instructions. If you have problems or questions, contact your doctor. Follow these instructions at home:  Take over-the-counter and prescription medicines only as told by your doctor.  Go back to your normal activities as told by your doctor.  Follow instructions from your doctor about how to take care of the area where an IV tube was put into your vein (insertion site). Make sure you: ? Wash your hands with soap and water before you change your bandage (dressing). If there is no soap and water, use hand sanitizer. ? Change your bandage as told by your doctor.  Check your IV insertion site every day for signs of infection. Check for: ? More redness, swelling, or pain. ? More fluid or blood. ? Warmth. ? Pus or a bad smell. Contact a doctor if:  You have more redness, swelling, or pain around the IV insertion site..  You have more fluid or blood coming from the IV insertion site.  Your IV insertion site feels warm to the touch.  You have pus or a bad smell coming from the IV insertion site.  Your pee (urine) turns pink, red, or brown.  You feel weak after doing your normal activities. Get help right away if:  You have signs of a serious allergic or body defense (immune) system reaction, including: ? Itchiness. ? Hives. ? Trouble breathing. ? Anxiety. ? Pain in your chest or lower back. ? Fever, flushing, and chills. ? Fast pulse. ? Rash. ? Watery poop (diarrhea). ? Throwing up (vomiting). ? Dark pee. ? Serious headache. ? Dizziness. ? Stiff neck. ? Yellow color in your face or the white parts of your eyes (jaundice). Summary  After a blood transfusion, return to your normal activities as told by your doctor.  Every day, check for signs of infection where the IV tube was put into your vein.  Some signs of  infection are warm skin, more redness and pain, more fluid or blood, and pus or a bad smell where the needle went in.  Contact your doctor if you feel weak or have any unusual symptoms. This information is not intended to replace advice given to you by your health care provider. Make sure you discuss any questions you have with your health care provider. Document Released: 08/07/2014 Document Revised: 03/10/2016 Document Reviewed: 03/10/2016 Elsevier Interactive Patient Education  2017 [place].     Anemia Anemia is a condition in which you do not have enough red blood cells or hemoglobin. Hemoglobin is a substance in red blood cells that carries oxygen. When you do not have enough red blood cells or hemoglobin (are anemic), your body cannot get enough oxygen and your organs may not work properly. As a result, you may feel very tired or have other problems. What are the causes? Common causes of anemia include:  Excessive bleeding. Anemia can be caused by excessive bleeding inside or outside the body, including bleeding from the intestine or from periods in women.  Poor nutrition.  Long-lasting (chronic) kidney, thyroid, and liver disease.  Bone marrow disorders.  Cancer and treatments for cancer.  HIV (human immunodeficiency virus) and AIDS (acquired immunodeficiency syndrome).  Treatments for HIV and AIDS.  Spleen problems.  Blood disorders.  Infections, medicines, and autoimmune disorders that destroy red blood cells.  What are the signs or symptoms? Symptoms of this condition  include:  Minor weakness.  Dizziness.  Headache.  Feeling heartbeats that are irregular or faster than normal (palpitations).  Shortness of breath, especially with exercise.  Paleness.  Cold sensitivity.  Indigestion.  Nausea.  Difficulty sleeping.  Difficulty concentrating.  Symptoms may occur suddenly or develop slowly. If your anemia is mild, you may not have symptoms. How  is this diagnosed? This condition is diagnosed based on:  Blood tests.  Your medical history.  A physical exam.  Bone marrow biopsy.  Your health care provider may also check your stool (feces) for blood and may do additional testing to look for the cause of your bleeding. You may also have other tests, including:  Imaging tests, such as a CT scan or MRI.  Endoscopy.  Colonoscopy.  How is this treated? Treatment for this condition depends on the cause. If you continue to lose a lot of blood, you may need to be treated at a hospital. Treatment may include:  Taking supplements of iron, vitamin B93, or folic acid.  Taking a hormone medicine (erythropoietin) that can help to stimulate red blood cell growth.  Having a blood transfusion. This may be needed if you lose a lot of blood.  Making changes to your diet.  Having surgery to remove your spleen.  Follow these instructions at home:  Take over-the-counter and prescription medicines only as told by your health care provider.  Take supplements only as told by your health care provider.  Follow any diet instructions that you were given.  Keep all follow-up visits as told by your health care provider. This is important. Contact a health care provider if:  You develop new bleeding anywhere in the body. Get help right away if:  You are very weak.  You are short of breath.  You have pain in your abdomen or chest.  You are dizzy or feel faint.  You have trouble concentrating.  You have bloody or black, tarry stools.  You vomit repeatedly or you vomit up blood. Summary  Anemia is a condition in which you do not have enough red blood cells or enough of a substance in your red blood cells that carries oxygen (hemoglobin).  Symptoms may occur suddenly or develop slowly.  If your anemia is mild, you may not have symptoms.  This condition is diagnosed with blood tests as well as a medical history and physical exam.  Other tests may be needed.  Treatment for this condition depends on the cause of the anemia. This information is not intended to replace advice given to you by your health care provider. Make sure you discuss any questions you have with your health care provider. Document Released: 08/24/2004 Document Revised: 08/18/2016 Document Reviewed: 08/18/2016 Elsevier Interactive Patient Education  [employer].

## 2017-08-17 NOTE — Telephone Encounter (Signed)
Pt's blood is difficult to crossmatch, per blood bank, a unit is coming from [facility] and will then take about an hour to crossmatch. Contacted pt's transportation company. They will not be able to pick pt up past 6PM. Discussed with pt and Tiffany, Collaborative RN: Both agreed to reschedule transfusion for 08/18/17. Notified transportation. They requested a fax to [facility] @ [phone] and [phone] pt needs transfusion on 1/19 and this appt can not be moved. Tiffany will fax note.

## 2017-08-17 NOTE — Assessment & Plan Note (Signed)
Bilateral breast cancers are usually diagnosed 2015 treated with surgery, radiation, anastrozole Metastatic breast cancer diagnosed 05/28/2015 as subpectoral mass, ER 5%, PR 0%, HER-2 negative, failed Xeloda and carboplatin and gemcitabine  Current Treatment: Olaparib Toxicities: Denies any side effects olaparib  Severe Anemia:Transfusions today and weekly she will come for this. Pain Management: Morphine + Duragesic patch.

## 2017-08-17 NOTE — Progress Notes (Signed)
Type an

## 2017-08-18 ENCOUNTER — Inpatient Hospital Stay: Payer: Medicare Other

## 2017-08-18 DIAGNOSIS — C50011 Malignant neoplasm of nipple and areola, right female breast: Secondary | ICD-10-CM | POA: Diagnosis not present

## 2017-08-18 DIAGNOSIS — C50012 Malignant neoplasm of nipple and areola, left female breast: Secondary | ICD-10-CM

## 2017-08-18 DIAGNOSIS — C50919 Malignant neoplasm of unspecified site of unspecified female breast: Secondary | ICD-10-CM

## 2017-08-18 LAB — PREPARE RBC (CROSSMATCH)

## 2017-08-18 MED ORDER — ACETAMINOPHEN 325 MG PO TABS
ORAL_TABLET | ORAL | Status: AC
  Filled 2017-08-18: qty 2

## 2017-08-18 MED ORDER — DIPHENHYDRAMINE HCL 25 MG PO CAPS
ORAL_CAPSULE | ORAL | Status: AC
  Filled 2017-08-18: qty 1

## 2017-08-18 MED ORDER — DIPHENHYDRAMINE HCL 25 MG PO CAPS
25.0000 mg | ORAL_CAPSULE | Freq: Once | ORAL | Status: AC
  Administered 2017-08-18: 25 mg via ORAL

## 2017-08-18 MED ORDER — SODIUM CHLORIDE 0.9 % IV SOLN
250.0000 mL | Freq: Once | INTRAVENOUS | Status: AC
  Administered 2017-08-18: 250 mL via INTRAVENOUS

## 2017-08-18 MED ORDER — SODIUM CHLORIDE 0.9% FLUSH
10.0000 mL | INTRAVENOUS | Status: AC | PRN
  Administered 2017-08-18: 10 mL
  Filled 2017-08-18: qty 10

## 2017-08-18 MED ORDER — ACETAMINOPHEN 325 MG PO TABS
650.0000 mg | ORAL_TABLET | Freq: Once | ORAL | Status: AC
  Administered 2017-08-18: 650 mg via ORAL

## 2017-08-18 MED ORDER — HEPARIN SOD (PORK) LOCK FLUSH 100 UNIT/ML IV SOLN
500.0000 [IU] | Freq: Every day | INTRAVENOUS | Status: AC | PRN
  Administered 2017-08-18: 500 [IU]
  Filled 2017-08-18: qty 5

## 2017-08-18 NOTE — Patient Instructions (Signed)

## 2017-08-19 LAB — TYPE AND SCREEN
ABO/RH(D): AB POS
Antibody Screen: NEGATIVE
DONOR AG TYPE: NEGATIVE
Unit division: 0

## 2017-08-19 LAB — BPAM RBC
Blood Product Expiration Date: 201902212359
ISSUE DATE / TIME: 201901190843
Unit Type and Rh: 6200

## 2017-08-20 ENCOUNTER — Telehealth: Payer: Self-pay | Admitting: Hematology and Oncology

## 2017-08-20 NOTE — Telephone Encounter (Signed)
No 1/18 los.

## 2017-08-21 ENCOUNTER — Telehealth: Payer: Self-pay

## 2017-08-21 NOTE — Telephone Encounter (Signed)
Pt called to ask if she is able to take benadryl for rash and itching on her legs. Pt states that she recently had some swelling in her legs and was prescribed lasix. Pt also taking morphine for pain control and states that she does get mild rash/itching from it. Pt able to tolerate the rash, and it has been present for a few days now. Pt states that it is tolerable but does get more itchy during the day at times.   Advised pt to take benadryl every 6hrs as needed for itching, use non scented and hypoallergenic lotion. Told pt that dryness/leg swelling may have caused her to have increase in itching as well. Pt verbalized understanding.  Reminded pt that she,possibly, will not receive blood transfusion on Friday due to her special antibodies. If that happens, pt will need to come back on Saturday for blood transfusion. Pt will need to let her transportation ([facility]) know ahead of time. Pt appreciative on information and will call [facility] tomorrow to give them a heads up, just in case she is unable to get blood transfusion for Friday. No further needs at this time.  Pt to call back if symptom worsens or go to the nearest ED.

## 2017-08-22 ENCOUNTER — Telehealth: Payer: Self-pay

## 2017-08-22 MED ORDER — POTASSIUM CHLORIDE CRYS ER 20 MEQ PO TBCR: 20.0000 meq | EXTENDED_RELEASE_TABLET | Freq: Every day | ORAL | 0 refills | Status: DC

## 2017-08-22 MED ORDER — FUROSEMIDE 20 MG PO TABS: 20.0000 mg | ORAL_TABLET | Freq: Two times a day (BID) | ORAL | 0 refills | Status: DC
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
## 2017-08-22 NOTE — Telephone Encounter (Signed)
Received VM from patient regarding wanting to know if "Lyrica can be increased?" and tumor on head was brown, now has blood coming from it and may call "paramedics". Called home number and it is disconnected and tried cell number, no answer, and the "mailbox is full", unable to leave VM. Called daughter's number [phone], left VM for Austina or Kennyth Lose to return our call, contact info given.

## 2017-08-24 ENCOUNTER — Telehealth: Payer: Self-pay | Admitting: Adult Health

## 2017-08-24 ENCOUNTER — Encounter: Payer: Self-pay | Admitting: Adult Health

## 2017-08-24 ENCOUNTER — Inpatient Hospital Stay: Payer: Medicare Other

## 2017-08-24 ENCOUNTER — Telehealth: Payer: Self-pay

## 2017-08-24 ENCOUNTER — Encounter: Payer: Self-pay | Admitting: General Practice

## 2017-08-24 ENCOUNTER — Inpatient Hospital Stay (HOSPITAL_BASED_OUTPATIENT_CLINIC_OR_DEPARTMENT_OTHER): Payer: Medicare Other | Admitting: Adult Health

## 2017-08-24 ENCOUNTER — Telehealth: Payer: Self-pay | Admitting: *Deleted

## 2017-08-24 ENCOUNTER — Other Ambulatory Visit: Payer: Self-pay | Admitting: *Deleted

## 2017-08-24 ENCOUNTER — Other Ambulatory Visit: Payer: Self-pay

## 2017-08-24 VITALS — BP 104/67 | HR 104 | Temp 98.7°F | Resp 24 | Ht 62.0 in | Wt 112.0 lb

## 2017-08-24 DIAGNOSIS — D6959 Other secondary thrombocytopenia: Secondary | ICD-10-CM

## 2017-08-24 DIAGNOSIS — G893 Neoplasm related pain (acute) (chronic): Secondary | ICD-10-CM | POA: Diagnosis not present

## 2017-08-24 DIAGNOSIS — C50011 Malignant neoplasm of nipple and areola, right female breast: Secondary | ICD-10-CM | POA: Diagnosis not present

## 2017-08-24 DIAGNOSIS — Z9221 Personal history of antineoplastic chemotherapy: Secondary | ICD-10-CM

## 2017-08-24 DIAGNOSIS — C50012 Malignant neoplasm of nipple and areola, left female breast: Secondary | ICD-10-CM

## 2017-08-24 DIAGNOSIS — C770 Secondary and unspecified malignant neoplasm of lymph nodes of head, face and neck: Secondary | ICD-10-CM

## 2017-08-24 DIAGNOSIS — Z170 Estrogen receptor positive status [ER+]: Secondary | ICD-10-CM

## 2017-08-24 DIAGNOSIS — C55 Malignant neoplasm of uterus, part unspecified: Secondary | ICD-10-CM

## 2017-08-24 DIAGNOSIS — M545 Low back pain: Secondary | ICD-10-CM

## 2017-08-24 DIAGNOSIS — D649 Anemia, unspecified: Secondary | ICD-10-CM | POA: Diagnosis not present

## 2017-08-24 DIAGNOSIS — C7989 Secondary malignant neoplasm of other specified sites: Secondary | ICD-10-CM | POA: Diagnosis not present

## 2017-08-24 DIAGNOSIS — C50919 Malignant neoplasm of unspecified site of unspecified female breast: Secondary | ICD-10-CM

## 2017-08-24 DIAGNOSIS — C7951 Secondary malignant neoplasm of bone: Secondary | ICD-10-CM

## 2017-08-24 DIAGNOSIS — C7801 Secondary malignant neoplasm of right lung: Secondary | ICD-10-CM | POA: Diagnosis not present

## 2017-08-24 DIAGNOSIS — Z923 Personal history of irradiation: Secondary | ICD-10-CM

## 2017-08-24 LAB — COMPREHENSIVE METABOLIC PANEL
ALT: 19 U/L (ref 0–55)
ANION GAP: 9 (ref 3–11)
AST: 58 U/L — ABNORMAL HIGH (ref 5–34)
Albumin: 2.2 g/dL — ABNORMAL LOW (ref 3.5–5.0)
Alkaline Phosphatase: 85 U/L (ref 40–150)
BUN: 15 mg/dL (ref 7–26)
CHLORIDE: 93 mmol/L — AB (ref 98–109)
CO2: 30 mmol/L — ABNORMAL HIGH (ref 22–29)
Calcium: 9.3 mg/dL (ref 8.4–10.4)
Creatinine, Ser: 0.74 mg/dL (ref 0.60–1.10)
GFR calc non Af Amer: >60 mL/min (ref >60)
Glucose, Bld: 104 mg/dL (ref 70–140)
POTASSIUM: 3.3 mmol/L (ref 3.3–4.7)
Sodium: 132 mmol/L — ABNORMAL LOW (ref 136–145)
Total Bilirubin: 0.6 mg/dL (ref 0.2–1.2)
Total Protein: 6.2 g/dL — ABNORMAL LOW (ref 6.4–8.3)

## 2017-08-24 LAB — CBC WITH DIFFERENTIAL/PLATELET
BASOS ABS: 0 10*3/uL (ref 0.0–0.1)
Basophils Relative: 0 %
Eosinophils Absolute: 0 10*3/uL (ref 0.0–0.5)
Eosinophils Relative: 0 %
HCT: 24.5 % — ABNORMAL LOW (ref 34.8–46.6)
HEMOGLOBIN: 8 g/dL — AB (ref 11.6–15.9)
LYMPHS ABS: 0.8 10*3/uL — AB (ref 0.9–3.3)
LYMPHS PCT: 7 %
MCH: 26.3 pg (ref 25.1–34.0)
MCHC: 32.7 g/dL (ref 31.5–36.0)
MCV: 80.6 fL (ref 79.5–101.0)
Monocytes Absolute: 0.7 10*3/uL (ref 0.1–0.9)
Monocytes Relative: 6 %
NEUTROS PCT: 87 %
Neutro Abs: 9.5 10*3/uL — ABNORMAL HIGH (ref 1.5–6.5)
Platelets: 325 10*3/uL (ref 145–400)
RBC: 3.04 MIL/uL — ABNORMAL LOW (ref 3.70–5.45)
RDW: 20.1 % — ABNORMAL HIGH (ref 11.2–16.1)
WBC: 11.1 10*3/uL — AB (ref 3.9–10.3)

## 2017-08-24 LAB — PREPARE RBC (CROSSMATCH)

## 2017-08-24 LAB — SAMPLE TO BLOOD BANK

## 2017-08-24 LAB — TSH: TSH: 0.642 u[IU]/mL (ref 0.308–3.960)

## 2017-08-24 MED ORDER — SODIUM CHLORIDE 0.9% FLUSH
10.0000 mL | INTRAVENOUS | Status: DC | PRN
  Administered 2017-08-24: 10 mL via INTRAVENOUS
  Filled 2017-08-24: qty 10

## 2017-08-24 MED ORDER — FENTANYL 50 MCG/HR TD PT72: 50.0000 ug | MEDICATED_PATCH | TRANSDERMAL | 0 refills | Status: DC

## 2017-08-24 MED ORDER — DIPHENHYDRAMINE HCL 25 MG PO CAPS: 25.0000 mg | ORAL_CAPSULE | Freq: Once | ORAL | Status: DC
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
## 2017-08-24 NOTE — Telephone Encounter (Signed)
"  I need appointments faxed to [facility] for tomorrow's appointment.  Fax information to them."   Paula Navarro with "[facility] [employer]" called requesting appointment information also needs to be faxed to them.  Ph: [phone], Fax: [phone].  1:58 pm, Message left for collaborative, this nurse will fax information  Patient will be picked up at 7:15 am per [facility].

## 2017-08-24 NOTE — Progress Notes (Signed)
Per SW, pt would benefit from [facility] referral for multifactoral issues and psycho social needs.
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
## 2017-08-24 NOTE — Telephone Encounter (Signed)
No 1/25 los.  

## 2017-08-24 NOTE — Telephone Encounter (Signed)
Faxed pt schedule for tomorrow's appt for blood transfusion to [facility]. Confirmed fax receipt with [facility] and spoke with [facility]. Pt will have transportation to/from cancer center tomorrow.

## 2017-08-25 ENCOUNTER — Other Ambulatory Visit: Payer: Self-pay | Admitting: *Deleted

## 2017-08-25 ENCOUNTER — Inpatient Hospital Stay: Payer: Medicare Other

## 2017-08-25 DIAGNOSIS — C50011 Malignant neoplasm of nipple and areola, right female breast: Secondary | ICD-10-CM | POA: Diagnosis not present

## 2017-08-25 DIAGNOSIS — C50012 Malignant neoplasm of nipple and areola, left female breast: Principal | ICD-10-CM

## 2017-08-25 DIAGNOSIS — C770 Secondary and unspecified malignant neoplasm of lymph nodes of head, face and neck: Secondary | ICD-10-CM

## 2017-08-25 DIAGNOSIS — C50919 Malignant neoplasm of unspecified site of unspecified female breast: Secondary | ICD-10-CM

## 2017-08-25 MED ORDER — DIPHENHYDRAMINE HCL 25 MG PO CAPS
ORAL_CAPSULE | ORAL | Status: AC
  Filled 2017-08-25: qty 1

## 2017-08-25 MED ORDER — SODIUM CHLORIDE 0.9% FLUSH
3.0000 mL | INTRAVENOUS | Status: AC | PRN
  Administered 2017-08-25: 10 mL
  Filled 2017-08-25: qty 10

## 2017-08-25 MED ORDER — SODIUM CHLORIDE 0.9 % IV SOLN: 25.0000 mg | Freq: Once | INTRAVENOUS | Status: DC

## 2017-08-25 MED ORDER — ACETAMINOPHEN 325 MG PO TABS
ORAL_TABLET | ORAL | Status: AC
  Filled 2017-08-25: qty 2

## 2017-08-25 MED ORDER — ACETAMINOPHEN 325 MG PO TABS
650.0000 mg | ORAL_TABLET | Freq: Once | ORAL | Status: AC
  Administered 2017-08-25: 650 mg via ORAL

## 2017-08-25 MED ORDER — SODIUM CHLORIDE 0.9 % IV SOLN
250.0000 mL | Freq: Once | INTRAVENOUS | Status: AC
  Administered 2017-08-25: 250 mL via INTRAVENOUS

## 2017-08-25 MED ORDER — HEPARIN SOD (PORK) LOCK FLUSH 100 UNIT/ML IV SOLN
250.0000 [IU] | INTRAVENOUS | Status: AC | PRN
  Administered 2017-08-25: 500 [IU]
  Filled 2017-08-25: qty 5

## 2017-08-25 NOTE — Patient Instructions (Signed)

## 2017-08-26 LAB — TYPE AND SCREEN
ABO/RH(D): AB POS
Antibody Screen: NEGATIVE
Donor AG Type: NEGATIVE
UNIT DIVISION: 0

## 2017-08-26 LAB — BPAM RBC
Blood Product Expiration Date: 201902172359
ISSUE DATE / TIME: 201901261111
UNIT TYPE AND RH: 7300

## 2017-08-27 MED ORDER — DIPHENHYDRAMINE HCL 25 MG PO CAPS
25.0000 mg | ORAL_CAPSULE | Freq: Once | ORAL | Status: AC
  Administered 2017-08-25: 25 mg via ORAL

## 2017-08-28 ENCOUNTER — Telehealth: Payer: Self-pay | Admitting: Hematology and Oncology

## 2017-08-28 ENCOUNTER — Encounter: Payer: Self-pay | Admitting: General Practice
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
## 2017-08-28 NOTE — Telephone Encounter (Signed)
Patient is scheduled per 1/25 sch message. Faxed copy of schedule to transportation for patient ([facility]).

## 2017-08-29 ENCOUNTER — Other Ambulatory Visit: Payer: Self-pay

## 2017-08-29 MED ORDER — BUMETANIDE 1 MG PO TABS: 1.0000 mg | ORAL_TABLET | Freq: Two times a day (BID) | ORAL | 0 refills | Status: DC

## 2017-08-29 NOTE — Telephone Encounter (Signed)
Returned pt VM regarding her lasix not seeming to work well and she is not having an increase in urine output. Per Dr. Lindi Adie he is discontinuing the lasix and starting pt on Bumex 1mg  bid. Script sent. Pt aware. Explained to pt why we are switching medications but also made her aware that this could help improve her symptoms but may not fully get rid of them due to the cancer. She is in understanding.  Cyndia Bent RN

## 2017-08-30 ENCOUNTER — Telehealth: Payer: Self-pay | Admitting: Gastroenterology

## 2017-08-30 ENCOUNTER — Encounter: Payer: Self-pay | Admitting: Gastroenterology

## 2017-08-30 ENCOUNTER — Ambulatory Visit: Payer: Medicare Other | Admitting: Gastroenterology

## 2017-08-30 NOTE — Telephone Encounter (Signed)
Patient was a no show and letter sent  °

## 2017-08-30 NOTE — Telephone Encounter (Signed)
REVIEWED-NO ADDITIONAL RECOMMENDATIONS. 

## 2017-08-30 NOTE — Assessment & Plan Note (Signed)
Bilateral breast cancers are usually diagnosed 2015 treated with surgery, radiation, anastrozole Metastatic breast cancer diagnosed 05/28/2015 as subpectoral mass, ER 5%, PR 0%, HER-2 negative, failed Xeloda and carboplatin and gemcitabine  Current Treatment: Olaparib Toxicities:Denies any side effects olaparib  Severe Anemia:Transfusionstoday and weekly she will come for this. Pain Management: Morphine + Duragesic patch.

## 2017-08-30 NOTE — Telephone Encounter (Signed)
PT LAST SEEN AND EVALUATED BY ONCOLOGY OF METASTATIC BREAST CA. SHE CAN CALL TO [facility] IF NEEDED.

## 2017-08-31 ENCOUNTER — Telehealth: Payer: Self-pay

## 2017-08-31 ENCOUNTER — Other Ambulatory Visit: Payer: Self-pay

## 2017-08-31 ENCOUNTER — Ambulatory Visit (HOSPITAL_COMMUNITY)
Admission: RE | Admit: 2017-08-31 | Discharge: 2017-08-31 | Disposition: A | Discharge status: Home / Self Care | Payer: Medicare Other | Source: Ambulatory Visit | Attending: Hematology and Oncology | Admitting: Hematology and Oncology

## 2017-08-31 ENCOUNTER — Inpatient Hospital Stay: Payer: Medicare Other

## 2017-08-31 ENCOUNTER — Encounter: Payer: Self-pay | Admitting: General Practice

## 2017-08-31 ENCOUNTER — Inpatient Hospital Stay: Payer: Medicare Other | Attending: Hematology and Oncology

## 2017-08-31 ENCOUNTER — Inpatient Hospital Stay (HOSPITAL_BASED_OUTPATIENT_CLINIC_OR_DEPARTMENT_OTHER): Payer: Medicare Other | Admitting: Hematology and Oncology

## 2017-08-31 ENCOUNTER — Telehealth: Payer: Self-pay | Admitting: General Practice

## 2017-08-31 DIAGNOSIS — C7989 Secondary malignant neoplasm of other specified sites: Secondary | ICD-10-CM

## 2017-08-31 DIAGNOSIS — C50011 Malignant neoplasm of nipple and areola, right female breast: Secondary | ICD-10-CM | POA: Diagnosis present

## 2017-08-31 DIAGNOSIS — K5900 Constipation, unspecified: Secondary | ICD-10-CM | POA: Diagnosis not present

## 2017-08-31 DIAGNOSIS — D649 Anemia, unspecified: Secondary | ICD-10-CM

## 2017-08-31 DIAGNOSIS — R5383 Other fatigue: Secondary | ICD-10-CM | POA: Insufficient documentation

## 2017-08-31 DIAGNOSIS — C50112 Malignant neoplasm of central portion of left female breast: Principal | ICD-10-CM

## 2017-08-31 DIAGNOSIS — C50919 Malignant neoplasm of unspecified site of unspecified female breast: Secondary | ICD-10-CM

## 2017-08-31 DIAGNOSIS — R197 Diarrhea, unspecified: Secondary | ICD-10-CM | POA: Insufficient documentation

## 2017-08-31 DIAGNOSIS — C50111 Malignant neoplasm of central portion of right female breast: Secondary | ICD-10-CM

## 2017-08-31 DIAGNOSIS — C770 Secondary and unspecified malignant neoplasm of lymph nodes of head, face and neck: Secondary | ICD-10-CM | POA: Insufficient documentation

## 2017-08-31 DIAGNOSIS — G893 Neoplasm related pain (acute) (chronic): Secondary | ICD-10-CM

## 2017-08-31 DIAGNOSIS — C50012 Malignant neoplasm of nipple and areola, left female breast: Secondary | ICD-10-CM | POA: Insufficient documentation

## 2017-08-31 DIAGNOSIS — E876 Hypokalemia: Secondary | ICD-10-CM | POA: Diagnosis not present

## 2017-08-31 DIAGNOSIS — C7951 Secondary malignant neoplasm of bone: Secondary | ICD-10-CM

## 2017-08-31 LAB — CBC WITH DIFFERENTIAL/PLATELET
BASOS ABS: 0 10*3/uL (ref 0.0–0.1)
BASOS PCT: 0 %
EOS ABS: 0 10*3/uL (ref 0.0–0.5)
Eosinophils Relative: 0 %
HEMATOCRIT: 26.4 % — AB (ref 34.8–46.6)
HEMOGLOBIN: 8.6 g/dL — AB (ref 11.6–15.9)
Lymphocytes Relative: 11 %
Lymphs Abs: 1.3 10*3/uL (ref 0.9–3.3)
MCH: 26.6 pg (ref 25.1–34.0)
MCHC: 32.6 g/dL (ref 31.5–36.0)
MCV: 81.7 fL (ref 79.5–101.0)
MONOS PCT: 7 %
Monocytes Absolute: 0.9 10*3/uL (ref 0.1–0.9)
NEUTROS ABS: 9.9 10*3/uL — AB (ref 1.5–6.5)
NEUTROS PCT: 82 %
Platelets: 399 10*3/uL (ref 145–400)
RBC: 3.23 MIL/uL — ABNORMAL LOW (ref 3.70–5.45)
RDW: 18.7 % — ABNORMAL HIGH (ref 11.2–14.5)
WBC: 12.2 10*3/uL — ABNORMAL HIGH (ref 3.9–10.3)

## 2017-08-31 LAB — COMPREHENSIVE METABOLIC PANEL
ALBUMIN: 2.2 g/dL — AB (ref 3.5–5.0)
ALK PHOS: 111 U/L (ref 40–150)
ALT: 18 U/L (ref 0–55)
ANION GAP: 11 (ref 3–11)
AST: 44 U/L — AB (ref 5–34)
BILIRUBIN TOTAL: 0.7 mg/dL (ref 0.2–1.2)
BUN: 16 mg/dL (ref 7–26)
CALCIUM: 8.9 mg/dL (ref 8.4–10.4)
CO2: 37 mmol/L — AB (ref 22–29)
Chloride: 86 mmol/L — ABNORMAL LOW (ref 98–109)
Creatinine, Ser: 0.72 mg/dL (ref 0.60–1.10)
GFR calc Af Amer: >60 mL/min (ref >60)
GFR calc non Af Amer: >60 mL/min (ref >60)
GLUCOSE: 97 mg/dL (ref 70–140)
Potassium: 2.4 mmol/L — CL (ref 3.5–5.1)
SODIUM: 134 mmol/L — AB (ref 136–145)
TOTAL PROTEIN: 6.4 g/dL (ref 6.4–8.3)

## 2017-08-31 LAB — TSH: TSH: 0.694 u[IU]/mL (ref 0.308–3.960)

## 2017-08-31 MED ORDER — SODIUM CHLORIDE 0.9% FLUSH
10.0000 mL | INTRAVENOUS | Status: DC | PRN
  Administered 2017-08-31: 10 mL via INTRAVENOUS
  Filled 2017-08-31: qty 10

## 2017-08-31 MED ORDER — GABAPENTIN 300 MG PO CAPS: ORAL_CAPSULE | ORAL | 2 refills | Status: AC

## 2017-08-31 MED ORDER — HEPARIN SOD (PORK) LOCK FLUSH 100 UNIT/ML IV SOLN
500.0000 [IU] | Freq: Once | INTRAVENOUS | Status: DC | PRN
  Filled 2017-08-31: qty 5
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
## 2017-08-31 NOTE — Telephone Encounter (Signed)
Called pt to inform her to start taking her potassium supplement twice a day. Potassium was low today at 2.4. Will recheck next week.  Cyndia Bent RN

## 2017-08-31 NOTE — Progress Notes (Signed)
[facility] Progress Note  Corrected DMA 3051 form submitted to [employer] to request assessment for Personal Care Services.  Edwyna Shell, LCSW Clinical Social Worker Phone:  [phone]

## 2017-08-31 NOTE — Telephone Encounter (Signed)
[place] CSW Progress Note  Chiropodist to determine status of referral for [place] made 08/28/17.  Per customer service, request was denied for "technical reasons" as provider had stated patient is "medically unstable."  Clarified w agency that patient is living independently in community, coming for outpatient visits.  Per agency, new referral must be submitted.  CSW will contact treatment team to complete new referral.  Edwyna Shell, LCSW Clinical Social Worker Phone:  [phone]

## 2017-09-02 ENCOUNTER — Other Ambulatory Visit: Payer: Self-pay | Admitting: Hematology and Oncology

## 2017-09-02 DIAGNOSIS — C50919 Malignant neoplasm of unspecified site of unspecified female breast: Secondary | ICD-10-CM

## 2017-09-02 DIAGNOSIS — C50912 Malignant neoplasm of unspecified site of left female breast: Secondary | ICD-10-CM

## 2017-09-02 DIAGNOSIS — C50911 Malignant neoplasm of unspecified site of right female breast: Secondary | ICD-10-CM

## 2017-09-02 DIAGNOSIS — Z170 Estrogen receptor positive status [ER+]: Secondary | ICD-10-CM

## 2017-09-02 DIAGNOSIS — C770 Secondary and unspecified malignant neoplasm of lymph nodes of head, face and neck: Secondary | ICD-10-CM

## 2017-09-03 ENCOUNTER — Telehealth: Payer: Self-pay

## 2017-09-03 NOTE — Telephone Encounter (Signed)
Called pt to see how she was doing and to find out if she is taking potassium BID. Pt states that she is taking 20mg  BID. Pt still has diarrhea x5 per day. She states it is in small amounts but the imodium is causing her abdominal bloating and gas. Pt wants to take kaopectate if that is okay. She states that it had helped her in the past. Told pt that she may try it if it is more effective for her. Told pt to call tomorrow and update if OTC med was effective.   Advised pt to call if she starts to have symptoms of hypokalemia. Pt verbalized understanding and will call for any more questions.

## 2017-09-04 ENCOUNTER — Other Ambulatory Visit: Payer: Self-pay | Admitting: Hematology and Oncology

## 2017-09-04 ENCOUNTER — Telehealth: Payer: Self-pay | Admitting: General Practice

## 2017-09-04 ENCOUNTER — Encounter: Payer: Self-pay | Admitting: General Practice

## 2017-09-04 ENCOUNTER — Other Ambulatory Visit: Payer: Self-pay | Admitting: *Deleted

## 2017-09-04 NOTE — Telephone Encounter (Signed)
[place] CSW Progress Note  CSW spoke w [place], [facility] Care Management Assistant - needs copy of most recent Medicare card in order to assess patient eligibility for [facility] services.  CSW called patient and asked her to bring this card to her next appt, patient agreeable.  Staff will need to screen in to document list, Bethann Berkshire will reassess eligibility on 2/8 based on updated card.  Edwyna Shell, LCSW Clinical Social Worker Phone:  [phone]

## 2017-09-04 NOTE — Patient Outreach (Addendum)
[facility] [facility]) Care Management  09/04/2017  Paula Navarro March 13, 1951 [id number]   Referral Received : 09/04/17 Referral source : MD referral , [facility]. Referral reason listed : Multifactorial psychological issues, assessment of care management and  medication management needs. . Diagnosis : Metastatic cancer, bilateral breast cancer   Unsuccessful telephone outreach to patient, not able to leave a voice message as mailbox full.  Plan Will plan return call within  the next 3  business days.    Joylene Draft, RN, [place] Management Coordinator  [phone]- Mobile [phone]- Toll Free Main Office

## 2017-09-04 NOTE — Telephone Encounter (Signed)
[place] CSW Progress Note  Referral for Personal Care Services [facility]) received by [employer].  Since Adult Protective Services has an open case on this patient, screening RN from [place] will contact Antony Madura at [place] to determine if patient meets criteria for [facility] assessment visit.  CSW awaiting results of determination of eligibility by [employer].  Patient has also been referred to [employer] for case management services.  Per screener, patient is not on list of eligible patients.  CSW will query to determine source of ineligibility.  Edwyna Shell, LCSW Clinical Social Worker Phone:  [phone]

## 2017-09-05 ENCOUNTER — Telehealth: Payer: Self-pay

## 2017-09-05 ENCOUNTER — Other Ambulatory Visit: Payer: Self-pay | Admitting: *Deleted

## 2017-09-05 ENCOUNTER — Encounter: Payer: Self-pay | Admitting: *Deleted

## 2017-09-05 NOTE — Patient Outreach (Signed)
[facility] [place] [facility]) Care Management  09/05/2017  Paula Navarro November 11, 1950 [id number]  Telephone screening call #[phone]  Referral Received : 09/04/17 Referral source : MD referral , [place] [facility]. Referral reason listed : Multifactorial psychological issues, assessment of care management and  medication management needs. . Diagnosis : Metastatic cancer, bilateral breast cancer   Unsuccessful telephone outreach to patient, not able to leave a voice message as mailbox full.   Plan Will send unsuccessful outreach letter.  Will place patient  on schedule for  10 business day 3rd call attempt.  Joylene Draft, RN, [place] Management Coordinator  [phone]- Mobile [phone]- Toll Free Main Office

## 2017-09-05 NOTE — Telephone Encounter (Signed)
na

## 2017-09-05 NOTE — Telephone Encounter (Signed)
Pt called to report that she is having some discomfort with constipation. Last week, pt was experiencing some diarrhea and was instructed by Dr.Gudena to use immodium. This week, pt is having a difficult time moving her bowels and is having abdominal pain while going to the bathroom.   Instructed pt that it could be from her imodium, as well as her pain medication slowing down her gut motility. Advised pt to increase oral hydration and to start taking miralax BID with plenty of water to help soften her stool. Pt verbalized understanding and will follow instructions. Will call with any more concerns or updates.  No further needs at this time.

## 2017-09-06 ENCOUNTER — Encounter (HOSPITAL_COMMUNITY): Payer: Self-pay | Admitting: Emergency Medicine

## 2017-09-06 ENCOUNTER — Other Ambulatory Visit: Payer: Self-pay

## 2017-09-06 ENCOUNTER — Emergency Department (HOSPITAL_COMMUNITY): Payer: Medicare Other

## 2017-09-06 ENCOUNTER — Emergency Department (HOSPITAL_COMMUNITY)
Admission: EM | Admit: 2017-09-06 | Discharge: 2017-09-06 | Disposition: A | Discharge status: Home / Self Care | Payer: Medicare Other | Attending: Emergency Medicine | Admitting: Emergency Medicine

## 2017-09-06 DIAGNOSIS — Z79899 Other long term (current) drug therapy: Secondary | ICD-10-CM | POA: Diagnosis not present

## 2017-09-06 DIAGNOSIS — K5903 Drug induced constipation: Secondary | ICD-10-CM

## 2017-09-06 DIAGNOSIS — K5900 Constipation, unspecified: Secondary | ICD-10-CM

## 2017-09-06 DIAGNOSIS — R1030 Lower abdominal pain, unspecified: Secondary | ICD-10-CM | POA: Diagnosis present

## 2017-09-06 DIAGNOSIS — Z87891 Personal history of nicotine dependence: Secondary | ICD-10-CM | POA: Diagnosis not present

## 2017-09-06 DIAGNOSIS — J449 Chronic obstructive pulmonary disease, unspecified: Secondary | ICD-10-CM | POA: Insufficient documentation

## 2017-09-06 DIAGNOSIS — C7800 Secondary malignant neoplasm of unspecified lung: Secondary | ICD-10-CM | POA: Diagnosis not present

## 2017-09-06 LAB — COMPREHENSIVE METABOLIC PANEL
ALBUMIN: 2.2 g/dL — AB (ref 3.5–5.0)
ALT: 19 U/L (ref 14–54)
AST: 45 U/L — AB (ref 15–41)
Alkaline Phosphatase: 80 U/L (ref 38–126)
Anion gap: 15 (ref 5–15)
BUN: 31 mg/dL — AB (ref 6–20)
CHLORIDE: 84 mmol/L — AB (ref 101–111)
CO2: 31 mmol/L (ref 22–32)
CREATININE: 1.06 mg/dL — AB (ref 0.44–1.00)
Calcium: 8.6 mg/dL — ABNORMAL LOW (ref 8.9–10.3)
GFR calc Af Amer: >60 mL/min (ref >60)
GFR, EST NON AFRICAN AMERICAN: 54 mL/min — AB (ref >60)
GLUCOSE: 101 mg/dL — AB (ref 65–99)
Potassium: 3.2 mmol/L — ABNORMAL LOW (ref 3.5–5.1)
SODIUM: 130 mmol/L — AB (ref 135–145)
Total Bilirubin: 1.4 mg/dL — ABNORMAL HIGH (ref 0.3–1.2)
Total Protein: 6.3 g/dL — ABNORMAL LOW (ref 6.5–8.1)

## 2017-09-06 LAB — URINALYSIS, ROUTINE W REFLEX MICROSCOPIC
BACTERIA UA: NONE SEEN
Bilirubin Urine: NEGATIVE
GLUCOSE, UA: NEGATIVE mg/dL
Hgb urine dipstick: NEGATIVE
Ketones, ur: NEGATIVE mg/dL
Nitrite: NEGATIVE
PH: 5 (ref 5.0–8.0)
PROTEIN: NEGATIVE mg/dL
Specific Gravity, Urine: 1.012 (ref 1.005–1.030)

## 2017-09-06 LAB — CBC
HEMATOCRIT: 23.5 % — AB (ref 36.0–46.0)
Hemoglobin: 7.7 g/dL — ABNORMAL LOW (ref 12.0–15.0)
MCH: 26.6 pg (ref 26.0–34.0)
MCHC: 32.8 g/dL (ref 30.0–36.0)
MCV: 81 fL (ref 78.0–100.0)
PLATELETS: 377 10*3/uL (ref 150–400)
RBC: 2.9 MIL/uL — ABNORMAL LOW (ref 3.87–5.11)
RDW: 18.6 % — AB (ref 11.5–15.5)
WBC: 13.8 10*3/uL — ABNORMAL HIGH (ref 4.0–10.5)

## 2017-09-06 LAB — LIPASE, BLOOD: LIPASE: 19 U/L (ref 11–51)

## 2017-09-06 MED ORDER — DICYCLOMINE HCL 20 MG PO TABS: 20.0000 mg | ORAL_TABLET | Freq: Two times a day (BID) | ORAL | 0 refills | Status: AC | PRN

## 2017-09-06 MED ORDER — POLYETHYLENE GLYCOL 3350 17 G PO PACK: 17.0000 g | PACK | Freq: Two times a day (BID) | ORAL | 0 refills | Status: AC

## 2017-09-06 MED ORDER — POTASSIUM CHLORIDE CRYS ER 20 MEQ PO TBCR
40.0000 meq | EXTENDED_RELEASE_TABLET | Freq: Once | ORAL | Status: AC
  Administered 2017-09-06: 40 meq via ORAL
  Filled 2017-09-06: qty 2

## 2017-09-06 MED ORDER — HEPARIN SOD (PORK) LOCK FLUSH 100 UNIT/ML IV SOLN
INTRAVENOUS | Status: AC
  Filled 2017-09-06: qty 5

## 2017-09-06 MED ORDER — HYDROCODONE-ACETAMINOPHEN 5-325 MG PO TABS
1.0000 | ORAL_TABLET | Freq: Once | ORAL | Status: AC
  Administered 2017-09-06: 1 via ORAL
  Filled 2017-09-06: qty 1

## 2017-09-06 NOTE — Assessment & Plan Note (Addendum)
Bilateral breast cancers are usually diagnosed 2015 treated with surgery, radiation, anastrozole Metastatic breast cancer diagnosed 05/28/2015 as subpectoral mass, ER 5%, PR 0%, HER-2 negative, failed Xeloda and carboplatin and gemcitabine  Current Treatment: Olaparib Toxicities:Denies any side effects olaparib  Severe Anemia:Transfusionstoday and weekly she will come for this. Pain Management: Morphine + Duragesic patch. Constipation: Added Laculose if needed. To continue Miralax and stool softener.

## 2017-09-06 NOTE — ED Provider Notes (Signed)
[facility] EMERGENCY DEPARTMENT Provider Note   CSN: [id number] Arrival date & time: 09/06/17  2831     History   Chief Complaint Chief Complaint  Patient presents with  . Abdominal Pain    HPI Paula Navarro is a 67 y.o. female.  HPI Patient with history of metastatic cancer presents with abdominal discomfort.  States she feels like something is draining in her abdomen.  Pain is episodic.  She also has associated dysuria.  She has had some difficulty having bowel movements.  She is on chronic opioids for her ongoing pain.  Denies any fever or chills.  No nausea vomiting. Past Medical History:  Diagnosis Date  . Anemia   . Anxiety   . Atrial septal defect 1996   Surgical repair in 1996  . Breast cancer ([place]) dx'd 06/2013  . Chest pain    Admitted to [facility] in 09/2011; refused stress test  . Chronic bronchitis   . Chronic pain   . COPD (chronic obstructive pulmonary disease) ([place])    on xray  . Endometrial cancer ([place])   . Metastatic cancer to lung ([place]) dx'd 2017  . Palpitation    Tachycardia reported by monitor clerk during a symptomatic spell  . Radiation 06/30/14-08/17/14   Bilateral Breast  . Tobacco abuse    60 pack years; 1.5 packs per day  . Wears dentures    top    Patient Active Problem List   Diagnosis Date Noted  . Anemia 08/03/2017  . DNR (do not resuscitate) 08/03/2017  . Rectal bleed 07/21/2017  . SIRS (systemic inflammatory response syndrome) ([place]) 07/20/2017  . Hypomagnesemia 07/20/2017  . Hyponatremia 07/20/2017  . Symptomatic anemia 07/20/2017  . Acute post-operative pain 05/10/2017  . Endometrial cancer ([place]) 12/18/2016  . Protein-calorie malnutrition, severe 11/15/2016  . UTI (urinary tract infection) 11/14/2016  . COPD (chronic obstructive pulmonary disease) ([place])   . 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 Chemotherapy-induced thrombocytopenia 06/13/2016  . Scalp lesion 04/01/2016  . Encounter for central line care  12/08/2015  . Port catheter in place 11/19/2015  . Insomnia 11/01/2015  . Microcytic anemia 09/28/2015  . Lung metastases ([place]) 09/10/2015  . Bone metastases ([place]) 07/20/2015  . Goals of care, counseling/discussion 07/13/2015  . Metastatic breast cancer ([place]) 06/29/2015  . Chronic pain 03/16/2015  . Vaginal bleeding 09/24/2014  . Postherpetic neuralgia 09/03/2014  . Lymphedema 09/03/2014  . Suspected herpes zoster left C5 distribution 08/14/2014  . 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 Anxiety 03/03/2014  . Bilateral breast cancer ([place]) 08/11/2013  . Palpitations   . Chest pain   . Atrial septal defect   . Laboratory test 11/25/2011  . Chronic bronchitis   . Tobacco abuse     Past Surgical History:  Procedure Laterality Date  . ASD REPAIR, OSTIUM PRIMUM  1996   dr Roxy Horseman  . AXILLARY LYMPH NODE DISSECTION Bilateral 04/08/2014   Procedure:  BILATERAL AXILLARY LYMPH NODE DISSECTION;  Surgeon: Autumn Messing III, MD;  Location: [place];  Service: General;  Laterality: Bilateral;  . BREAST BIOPSY Bilateral   . BREAST LUMPECTOMY WITH RADIOACTIVE SEED LOCALIZATION Bilateral 04/08/2014   Procedure: BILATERAL  RADIOACTIVE SEED LOCALIZATION LUMPECTOMY ;  Surgeon: Autumn Messing III, MD;  Location: [place];  Service: General;  Laterality: Bilateral;  . CESAREAN SECTION     x3  . CHOLECYSTECTOMY    . LAPAROTOMY N/A 02/13/2017   Procedure: MINI EXPLORATORY LAPAROTOMY;  Surgeon: Everitt Amber, MD;  Location: [facility];  Service: Gynecology;  Laterality: N/A;  . open heart surgery    . PORT A CATH REVISION  1/15   put in   . ROBOTIC ASSISTED TOTAL HYSTERECTOMY WITH BILATERAL SALPINGO OOPHERECTOMY Bilateral 02/13/2017   Procedure: XI ROBOTIC ASSISTED TOTAL HYSTERECTOMY WITH BILATERAL SALPINGO OOPHORECTOMY WITH LYSIS OF ADHESIONS, UTERUS GREATER THAN 250GRAMS;  Surgeon: Everitt Amber, MD;  Location: [facility];  Service: Gynecology;  Laterality: Bilateral;  .  TUBAL LIGATION      OB History    Gravida Para Term Preterm AB Living   4 3 3          SAB TAB Ectopic Multiple Live Births                   Home Medications    Prior to Admission medications   Medication Sig Start Date End Date Taking? Authorizing Provider  acetaminophen (TYLENOL) 500 MG tablet Take 500 mg by mouth 3 (three) times daily as needed (BREAKTHROUGH PAIN).     [provider]  bumetanide (BUMEX) 1 MG tablet Take 1 tablet (1 mg total) by mouth 2 (two) times daily. 08/29/17   Nicholas Lose, MD  Calcium Carb-Cholecalciferol (CALCIUM 500/D) 500-400 MG-UNIT CHEW Chew 1 tablet by mouth daily.    [provider]  dicyclomine (BENTYL) 20 MG tablet Take 1 tablet (20 mg total) by mouth 2 (two) times daily as needed for spasms. 09/06/17   Julianne Rice, MD  docusate sodium (COLACE) 100 MG capsule Take 1 capsule (100 mg total) by mouth 2 (two) times daily. Patient taking differently: Take 100 mg by mouth daily.  04/06/17   Gardenia Phlegm, NP  fentaNYL (DURAGESIC - DOSED MCG/HR) 50 MCG/HR Place 1 patch (50 mcg total) onto the skin every 3 (three) days. 08/24/17   Gardenia Phlegm, NP  gabapentin (NEURONTIN) 300 MG capsule TAKE 2 CAPSULES (600 MG TOTAL) BY MOUTH 3 (THREE) TIMES DAILY. 08/31/17   Nicholas Lose, MD  lidocaine-prilocaine (EMLA) cream APPLY TO AFFECTED AREA ONCE 09/03/17   Nicholas Lose, MD  LYNPARZA 100 MG tablet  08/03/17   [provider]  morphine (MSIR) 30 MG tablet Take 1 tablet (30 mg total) by mouth every 6 (six) hours as needed for severe pain. 08/10/17   Nicholas Lose, MD  ondansetron (ZOFRAN) 4 MG tablet Take 1 tablet (4 mg total) by mouth every 6 (six) hours. 08/06/17   Lily Kocher, PA-C  polyethylene glycol (MIRALAX / GLYCOLAX) packet Take 17 g by mouth 2 (two) times daily. 09/06/17   Julianne Rice, MD  potassium chloride SA (K-DUR,KLOR-CON) 20 MEQ tablet Take 1 tablet (20 mEq total) by mouth daily. 08/22/17   Nicholas Lose, MD    SUPER B COMPLEX/C PO Take 1 tablet by mouth daily.    [provider]  zolpidem (AMBIEN) 10 MG tablet TAKE ONE TABLET BY MOUTH AT BEDTIME AS NEEDED FOR SLEEP 09/05/17   Nicholas Lose, MD    Family History Family History  Problem Relation Age of Onset  . Breast cancer Maternal Aunt     Social History Social History   Tobacco Use  . Smoking status: Former Smoker    Packs/day: 1.50    Years: 40.00    Pack years: 60.00    Types: Cigarettes    Last attempt to quit: 08/01/2012    Years since quitting: 5.1  . Smokeless tobacco: Never Used  Substance Use Topics  . Alcohol use: No  .  Drug use: No     Allergies   Codeine; Morphine and related; and Oxycodone hcl   Review of Systems Review of Systems  Constitutional: Negative for chills and fever.  Respiratory: Negative for cough and shortness of breath.   Cardiovascular: Negative for chest pain.  Gastrointestinal: Positive for abdominal pain and constipation. Negative for blood in stool, diarrhea and nausea.  Genitourinary: Positive for dysuria and frequency. Negative for difficulty urinating, flank pain and hematuria.  Musculoskeletal: Negative for back pain, myalgias and neck pain.  Skin: Negative for rash and wound.  Neurological: Negative for dizziness, weakness, light-headedness, numbness and headaches.  All other systems reviewed and are negative.    Physical Exam Updated Vital Signs BP 99/62   Pulse 78   Temp 98.7 F (37.1 C) (Oral)   Resp 16   Ht 5\' 2"  (1.575 m)   Wt 49 kg (108 lb)   LMP 05/20/2011   SpO2 99%   BMI 19.75 kg/m   Physical Exam  Constitutional: She is oriented to person, place, and time. She appears well-developed and well-nourished. No distress.  HENT:  Head: Normocephalic and atraumatic.  Mouth/Throat: Oropharynx is clear and moist.  Eyes: EOM are normal. Pupils are equal, round, and reactive to light.  Neck: Normal range of motion. Neck supple.  Cardiovascular: Normal rate and  regular rhythm.  Pulmonary/Chest: Effort normal and breath sounds normal.  Abdominal: Soft. Bowel sounds are normal. There is tenderness. There is no rebound and no guarding.  Mild suprapubic tenderness to palpation.  Musculoskeletal: Normal range of motion. She exhibits no edema or tenderness.  Neurological: She is alert and oriented to person, place, and time.  Skin: Skin is warm and dry. No rash noted. She is not diaphoretic. No erythema.  Psychiatric: She has a normal mood and affect. Her behavior is normal.  Nursing note and vitals reviewed.    ED Treatments / Results  Labs (all labs ordered are listed, but only abnormal results are displayed) Labs Reviewed  COMPREHENSIVE METABOLIC PANEL - Abnormal; Notable for the following components:      Result Value   Sodium 130 (*)    Potassium 3.2 (*)    Chloride 84 (*)    Glucose, Bld 101 (*)    BUN 31 (*)    Creatinine, Ser 1.06 (*)    Calcium 8.6 (*)    Total Protein 6.3 (*)    Albumin 2.2 (*)    AST 45 (*)    Total Bilirubin 1.4 (*)    GFR calc non Af Amer 54 (*)    All other components within normal limits  CBC - Abnormal; Notable for the following components:   WBC 13.8 (*)    RBC 2.90 (*)    Hemoglobin 7.7 (*)    HCT 23.5 (*)    RDW 18.6 (*)    All other components within normal limits  URINALYSIS, ROUTINE W REFLEX MICROSCOPIC - Abnormal; Notable for the following components:   APPearance HAZY (*)    Leukocytes, UA TRACE (*)    Squamous Epithelial / LPF 0-5 (*)    All other components within normal limits  LIPASE, BLOOD    EKG  EKG Interpretation None       Radiology Dg Abd Acute W/chest  Result Date: 09/06/2017 CLINICAL DATA:  Abdominal pain and pressure for the past week. History of metastatic breast cancer. EXAM: DG ABDOMEN ACUTE W/ 1V CHEST COMPARISON:  Chest x-ray dated August 02, 2017. Abdominal x-ray dated July 19, 2017. FINDINGS: There is no evidence of dilated bowel loops or free intraperitoneal  air. Large colonic stool burden. No radiopaque calculi or other significant radiographic abnormality is seen. Unchanged right chest wall port catheter. Normal heart size. New airspace disease in the left lower lobe. No pleural effusion or pneumothorax skin fold over the peripheral right hemithorax. Relatively unchanged large left chest wall mass. IMPRESSION: 1. Large colonic stool burden. Correlate for constipation. No evidence of obstruction. 2. New airspace disease in the left lower lobe, atelectasis versus infiltrate. 3. Unchanged large left chest wall mass. Electronically Signed   By: Titus Dubin M.D.   On: 09/06/2017 13:32    Procedures Procedures (including critical care time)  Medications Ordered in ED Medications  HYDROcodone-acetaminophen (NORCO/VICODIN) 5-325 MG per tablet 1 tablet (not administered)  potassium chloride SA (K-DUR,KLOR-CON) CR tablet 40 mEq (not administered)     Initial Impression / Assessment and Plan / ED Course  I have reviewed the triage vital signs and the nursing notes.  Pertinent labs & imaging results that were available during my care of the patient were reviewed by me and considered in my medical decision making (see chart for details).      Patient denies respiratory symptoms.  Abdominal exam is benign.  Patient is on palliative chemotherapy for metastatic breast cancer.  Likely has constipation due to chronic narcotic use.  Will start back on MiraLAX.  Patient also has hypokalemia and will replace orally.  Advised to follow-up with her primary physician.  Return precautions given. Final Clinical Impressions(s) / ED Diagnoses   Final diagnoses:  Constipation, unspecified constipation type    ED Discharge Orders        Ordered    dicyclomine (BENTYL) 20 MG tablet  2 times daily PRN     09/06/17 1505    polyethylene glycol (MIRALAX / GLYCOLAX) packet  2 times daily     09/06/17 1505       Julianne Rice, MD 09/06/17 1506

## 2017-09-06 NOTE — ED Triage Notes (Signed)
PT c/o generalized abdominal pain and pressure x7 days. PT also c/o burning with urination. PT states she is currently on Chemo pills at home as well. Last BM 09/05/17.

## 2017-09-06 NOTE — Progress Notes (Signed)
[facility] Follow up:    Paula Navarro, [place] [address]   DIAGNOSIS: Cancer Staging Bilateral breast cancer [facility]) Staging form: Breast, AJCC 7th Edition - Clinical: Stage IIB (T2, N1, cM0) - Unsigned Staging comments: Staged at breast conference 08/13/13  - Pathologic: Stage IV (M1) - Unsigned  Endometrial cancer ([place]) Staging form: Corpus Uteri - Carcinoma and Carcinosarcoma, AJCC 8th Edition - Pathologic stage from 02/13/2017: FIGO Stage IIIA, calculated as Stage Unknown (pT3a, pNX, cM0) - Signed by Everitt Amber, MD on 03/14/2017 - Clinical: No stage assigned - Unsigned   SUMMARY OF ONCOLOGIC HISTORY:   Bilateral breast cancer ([place])   07/23/2013 Mammogram    Bilateral breast masses. 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 N1 M0 stage IIB ER 100%, PR 0%, HER-2       06/17/2014 -  Radiation Therapy    Adjuvant radiation therapy      09/14/2014 -  Anti-estrogen oral  therapy    Anastrozole 1 mg daily      05/28/2015 Imaging    CT scans: Enlarging subpectoral masses 3.1 x 3.5 cm, posteriorly lower density mass 4.5 x 2.1 cm, several right-sided lung nodules right lower lobe 1.4 cm, 3 other right lung nodules, 1.7 cm right iliac bone lesion      06/21/2015 Procedure    Left subpectoral mass biopsy: Invasive high-grade ductal carcinoma ER 5%, PR 0%, HER-2 negative ratio 1.29      09/01/2015 Imaging    Left chest wall mass increased in size 7.2 x 5.1 cm, multiple subcutaneous nodules, increase in the lung nodules both in number as well as in the size of existing nodules      09/10/2015 -  Chemotherapy    Carboplatin, gemcitabine days 1 and 8 q 3 weeks, carboplatin discontinued for neuropathy (treatment break from 01/20/2016 to 03/24/2016); Added Carboplatin back 04/14/16 (for progression)      01/26/2016 Imaging    Marked improvement in size of lung nodules with many of the nodules resolved index right lower lobe nodule 1.9 x 1.8 cm is now 0.7 x 0.6 cm, reduction in the size of left breast mass 7.2 cm down to 2.6 cm, enlargement in the hypodense mass in the uterus      04/06/2016 Imaging    CT chest: Interval progression of pre-existing lung nodules new lung metastases (  59m, 172m 2.9 cm, 1.6 cm), interval progression of disease in the left breast 4.2 cm (was 2.6 cm), additional nodules 2.3 cm and 3.2 cm; CT head: scalp mass 1.9 cm      06/27/2016 Imaging    Ct chest: Stable lesions in lt breast deep to Left pectoralis, cystic lesion loc ant in left breast inc compared to previous; decrease in lung nodules (32m932mo 3 mm; 12 mm to 7 mm, RML nodule 2.9 cm to 1.8 cm, RLL 19m51m 7 mm)      11/14/2016 Imaging    CT CAP:RP and retrocrural lymphadenopathy, large uterine fibroids with necrosis; MRI Abd: RP lymphadenopathy CT chest 11/20/16: Worsening bulky conglomeration of mediastinal and right hilar lymph nodes, lung nodules subcentimeter size slight increase, left  breast mass was 3.7 x 5.8 cm now 5.4 x 7.4 cm      12/08/2016 - 04/06/2017 Chemotherapy    Palliative chemotherapy with [place] days 1 and 8 every 3 weeks stopped due to peripheral neuropathy       04/30/2017 - 05/18/2017 Radiation Therapy    Palliative radiation therapy to the scalp and the left chest wall lesion      06/11/2017 Imaging    Progression noted with new and progressing pulmonary nodules, increasing size of periaortic mass, increasing iliac adenopathy with mass lesion difficult to separate from adjacent psoas muscle, new 15mm27mule in the LUQ anterior to the colon.        06/25/2017 Miscellaneous    Olaparib (patient is BRCA mutation positive)       Endometrial cancer (HCC) [place]/17/2018 Surgery    Hysterectomy with BSO: HG Serous carcinoma 7.7 cm (>50% Myometrial inv)involving Uterus and Bilateral ovaries, Omentum: No carcinoma; T3a (FIGO stage IIIa)       CURRENT THERAPY: Olaparib  INTERVAL HISTORY: Paula Navarro.49 female returns for her weekly evaluation of Olabparib.  She tells me that she is doing very well.  She denies any issues today.  She did  Go to ER yesterday with abdominal discomfort.  She was found to be significantly constipated.  She says that she was recommended to take Miralax, but hasn't had much luck.  She does continue to lose weight.  Other than that she is doing well and her pain is controlled with her current regimen.     Patient Active Problem List   Diagnosis Date Noted  . Anemia 08/03/2017  . DNR (do not resuscitate) 08/03/2017  . Rectal bleed 07/21/2017  . SIRS (systemic inflammatory response syndrome) (HCC) [place]21/2018  . Hypomagnesemia 07/20/2017  . Hyponatremia 07/20/2017  . Symptomatic anemia 07/20/2017  . Acute post-operative pain 05/10/2017  . Endometrial cancer (HCC) [place]21/2018  . Protein-calorie malnutrition, severe 11/15/2016  . UTI (urinary tract infection) 11/14/2016  . 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 Chemotherapy-induced thrombocytopenia 06/13/2016  . Scalp lesion 04/01/2016  . Encounter for central line care 12/08/2015  . Port catheter in place 11/19/2015  . Insomnia 11/01/2015  . Microcytic anemia 09/28/2015  . Lung metastases (HCC) [place]10/2017  . Bone metastases (HCC) [place]20/2016  . Goals of care, counseling/discussion 07/13/2015  . Metastatic breast cancer (HCC) [place]29/2016  . Chronic pain 03/16/2015  . Vaginal bleeding 09/24/2014  . Postherpetic neuralgia 09/03/2014  . Lymphedema 09/03/2014  . Suspected herpes zoster left C5 distribution 08/14/2014  . Neuropathy due to chemotherapeutic drug (HCC) [place]04/2015  . Anxiety 03/03/2014  . Bilateral  breast cancer ([place]) 08/11/2013  . Palpitations   . Chest pain   . Atrial septal defect   . Laboratory test 11/25/2011  . Chronic bronchitis   . Tobacco abuse     is allergic to codeine; morphine and related; and oxycodone hcl.  MEDICAL HISTORY: Past Medical History:  Diagnosis Date  . Anemia   . Anxiety   . Atrial septal defect 1996   Surgical repair in 1996  . Breast cancer ([place]) dx'd 06/2013  . Chest pain    Admitted to [facility] in 09/2011; refused stress test  . Chronic bronchitis   . Chronic pain   . COPD (chronic obstructive pulmonary disease) ([place])    on xray  . Endometrial cancer ([place])   . Metastatic cancer to lung ([place]) dx'd 2017  . Palpitation    Tachycardia reported by monitor clerk during a symptomatic spell  . Radiation 06/30/14-08/17/14   Bilateral Breast  . Tobacco abuse    60 pack years; 1.5 packs per day  . Wears dentures    top    SURGICAL HISTORY: Past Surgical History:  Procedure Laterality Date  . ASD REPAIR, OSTIUM PRIMUM  1996   dr Roxy Horseman  . AXILLARY LYMPH NODE DISSECTION Bilateral 04/08/2014   Procedure:  BILATERAL AXILLARY LYMPH NODE DISSECTION;  Surgeon: Autumn Messing III, MD;  Location: [place];  Service:  General;  Laterality: Bilateral;  . BREAST BIOPSY Bilateral   . BREAST LUMPECTOMY WITH RADIOACTIVE SEED LOCALIZATION Bilateral 04/08/2014   Procedure: BILATERAL  RADIOACTIVE SEED LOCALIZATION LUMPECTOMY ;  Surgeon: Autumn Messing III, MD;  Location: [place];  Service: General;  Laterality: Bilateral;  . CESAREAN SECTION     x3  . CHOLECYSTECTOMY    . LAPAROTOMY N/A 02/13/2017   Procedure: MINI EXPLORATORY LAPAROTOMY;  Surgeon: Everitt Amber, MD;  Location: [facility];  Service: Gynecology;  Laterality: N/A;  . open heart surgery    . PORT A CATH REVISION  1/15   put in   . ROBOTIC ASSISTED TOTAL HYSTERECTOMY WITH BILATERAL SALPINGO OOPHERECTOMY Bilateral 02/13/2017   Procedure: XI ROBOTIC ASSISTED TOTAL HYSTERECTOMY WITH BILATERAL SALPINGO OOPHORECTOMY WITH LYSIS OF ADHESIONS, UTERUS GREATER THAN 250GRAMS;  Surgeon: Everitt Amber, MD;  Location: [facility];  Service: Gynecology;  Laterality: Bilateral;  . TUBAL LIGATION      SOCIAL HISTORY: Social History   Socioeconomic History  . Marital status: Widowed    Spouse name: Not on file  . Number of children: Not on file  . Years of education: Not on file  . Highest education level: Not on file  Social Needs  . Financial resource strain: Not on file  . Food insecurity - worry: Not on file  . Food insecurity - inability: Not on file  . Transportation needs - medical: Not on file  . Transportation needs - non-medical: Not on file  Occupational History  . Not on file  Tobacco Use  . Smoking status: Former Smoker    Packs/day: 1.50    Years: 40.00    Pack years: 60.00    Types: Cigarettes    Last attempt to quit: 08/01/2012    Years since quitting: 5.1  . Smokeless tobacco: Never Used  Substance and Sexual Activity  . Alcohol use: No  . Drug use: No  . Sexual activity: Not Currently  Other Topics Concern  . Not on file  Social History Narrative  . Not on file    FAMILY HISTORY: Family History  Problem  Relation Age of Onset   . Breast cancer Maternal Aunt     Review of Systems  Constitutional: Negative for appetite change, chills, fatigue and fever.  HENT:   Negative for hearing loss, lump/mass and mouth sores.   Eyes: Negative for eye problems and icterus.  Respiratory: Negative for chest tightness, cough and shortness of breath.   Cardiovascular: Negative for chest pain and leg swelling.  Gastrointestinal: Positive for constipation. Negative for abdominal distention, abdominal pain, blood in stool, diarrhea, nausea and vomiting.  Endocrine: Negative for hot flashes.  Skin: Negative for itching and rash.  Neurological: Negative for dizziness, extremity weakness, headaches and numbness.  Hematological: Negative for adenopathy. Does not bruise/bleed easily.  Psychiatric/Behavioral: Negative for depression. The patient is not nervous/anxious.       PHYSICAL EXAMINATION  ECOG PERFORMANCE STATUS: 1 - Symptomatic but completely ambulatory  Vitals:   09/07/17 1256  BP: 95/64  Pulse: 92  Resp: 17  Temp: (!) 97.5 F (36.4 C)  SpO2: 99%    Physical Exam  Constitutional: She is oriented to person, place, and time and well-developed, well-nourished, and in no distress.  HENT:  Head: Normocephalic and atraumatic.  Right Ear: External ear normal.  Mouth/Throat: Oropharynx is clear and moist. No oropharyngeal exudate.  Eyes: Pupils are equal, round, and reactive to light. No scleral icterus.  Neck: Neck supple.  Cardiovascular: Normal rate, regular rhythm, normal heart sounds and intact distal pulses.  Pulmonary/Chest: Effort normal and breath sounds normal. No respiratory distress. She has no wheezes. She has no rales.  Abdominal: Soft. Bowel sounds are normal. She exhibits no distension and no mass. There is no tenderness. There is no rebound and no guarding.  Musculoskeletal: She exhibits no edema.  Lymphadenopathy:    She has no cervical adenopathy.  Neurological: She is alert and oriented to person,  place, and time.  Skin: Skin is warm and dry. No rash noted. No erythema.  Psychiatric: Mood and affect normal.    LABORATORY DATA:  CBC    Component Value Date/Time   WBC 15.4 (H) 09/07/2017 1156   RBC 3.08 (L) 09/07/2017 1156   HGB 8.1 (L) 09/07/2017 1156   HGB 6.7 (LL) 07/19/2017 1051   HCT 24.8 (L) 09/07/2017 1156   HCT 21.0 (L) 07/19/2017 1051   PLT 394 09/07/2017 1156   PLT 444 (H) 07/19/2017 1051   MCV 80.5 09/07/2017 1156   MCV 73.0 (L) 07/19/2017 1051   MCH 26.3 09/07/2017 1156   MCHC 32.7 09/07/2017 1156   RDW 18.9 (H) 09/07/2017 1156   RDW 24.6 (H) 07/19/2017 1051   LYMPHSABS 1.4 09/07/2017 1156   LYMPHSABS 1.1 07/19/2017 1051   MONOABS 1.0 (H) 09/07/2017 1156   MONOABS 0.7 07/19/2017 1051   EOSABS 0.0 09/07/2017 1156   EOSABS 0.0 07/19/2017 1051   BASOSABS 0.0 09/07/2017 1156   BASOSABS 0.0 07/19/2017 1051    CMP     Component Value Date/Time   NA 132 (L) 09/07/2017 1156   NA 134 (L) 07/19/2017 1051   K 4.2 09/07/2017 1156   K 3.9 07/19/2017 1051   CL 87 (L) 09/07/2017 1156   CO2 33 (H) 09/07/2017 1156   CO2 27 07/19/2017 1051   GLUCOSE 103 09/07/2017 1156   GLUCOSE 104 07/19/2017 1051   BUN 33 (H) 09/07/2017 1156   BUN 14.0 07/19/2017 1051   CREATININE 1.13 (H) 09/07/2017 1156   CREATININE 0.8 07/19/2017 1051   CALCIUM 9.0 09/07/2017 1156   CALCIUM 9.6  07/19/2017 1051   PROT 6.4 09/07/2017 1156   PROT 7.0 07/19/2017 1051   ALBUMIN 2.2 (L) 09/07/2017 1156   ALBUMIN 2.5 (L) 07/19/2017 1051   AST 49 (H) 09/07/2017 1156   AST 30 07/19/2017 1051   ALT 16 09/07/2017 1156   ALT 12 07/19/2017 1051   ALKPHOS 101 09/07/2017 1156   ALKPHOS 64 07/19/2017 1051   BILITOT 0.8 09/07/2017 1156   BILITOT 0.42 07/19/2017 1051   GFRNONAA 50 (L) 09/07/2017 1156   GFRAA 58 (L) 09/07/2017 1156     RADIOGRAPHIC STUDIES:  Dg Abd Acute W/chest  Result Date: 09/06/2017 CLINICAL DATA:  Abdominal pain and pressure for the past week. History of metastatic breast  cancer. EXAM: DG ABDOMEN ACUTE W/ 1V CHEST COMPARISON:  Chest x-ray dated August 02, 2017. Abdominal x-ray dated July 19, 2017. FINDINGS: There is no evidence of dilated bowel loops or free intraperitoneal air. Large colonic stool burden. No radiopaque calculi or other significant radiographic abnormality is seen. Unchanged right chest wall port catheter. Normal heart size. New airspace disease in the left lower lobe. No pleural effusion or pneumothorax skin fold over the peripheral right hemithorax. Relatively unchanged large left chest wall mass. IMPRESSION: 1. Large colonic stool burden. Correlate for constipation. No evidence of obstruction. 2. New airspace disease in the left lower lobe, atelectasis versus infiltrate. 3. Unchanged large left chest wall mass. Electronically Signed   By: Titus Dubin M.D.   On: 09/06/2017 13:32    ASSESSMENT and PLAN:   Bilateral breast cancer ([place]) Bilateral breast cancers are usually diagnosed 2015 treated with surgery, radiation, anastrozole Metastatic breast cancer diagnosed 05/28/2015 as subpectoral mass, ER 5%, PR 0%, HER-2 negative, failed Xeloda and carboplatin and gemcitabine  Current Treatment: Olaparib Toxicities:Denies any side effects olaparib  Severe Anemia:Transfusionstoday and weekly she will come for this. Pain Management: Morphine + Duragesic patch. Constipation: Added Laculose if needed. To continue Miralax and stool softener.        All questions were answered. The patient knows to call the clinic with any problems, questions or concerns. We can certainly see the patient much sooner if necessary.  A total of (30) minutes of face-to-face time was spent with this patient with greater than 50% of that time in counseling and care-coordination.  This note was electronically signed. Scot Dock, NP 09/07/2017   Addendum: At the end of the visit, while leaving, Paula Navarro felt faint.  She revealed she hadn't eaten all day.   We gave her some food and drink and she ate and felt much better.

## 2017-09-06 NOTE — ED Notes (Signed)
Family arrived to transport patient home.

## 2017-09-07 ENCOUNTER — Telehealth: Payer: Self-pay | Admitting: General Practice

## 2017-09-07 ENCOUNTER — Inpatient Hospital Stay (HOSPITAL_BASED_OUTPATIENT_CLINIC_OR_DEPARTMENT_OTHER): Payer: Medicare Other | Admitting: Adult Health

## 2017-09-07 ENCOUNTER — Inpatient Hospital Stay: Payer: Medicare Other

## 2017-09-07 ENCOUNTER — Other Ambulatory Visit: Payer: Self-pay

## 2017-09-07 ENCOUNTER — Encounter: Payer: Self-pay | Admitting: Adult Health

## 2017-09-07 VITALS — BP 95/64 | HR 92 | Temp 97.5°F | Resp 17 | Ht 62.0 in | Wt 105.8 lb

## 2017-09-07 DIAGNOSIS — C770 Secondary and unspecified malignant neoplasm of lymph nodes of head, face and neck: Secondary | ICD-10-CM

## 2017-09-07 DIAGNOSIS — C541 Malignant neoplasm of endometrium: Secondary | ICD-10-CM

## 2017-09-07 DIAGNOSIS — C7989 Secondary malignant neoplasm of other specified sites: Secondary | ICD-10-CM | POA: Diagnosis not present

## 2017-09-07 DIAGNOSIS — D649 Anemia, unspecified: Secondary | ICD-10-CM

## 2017-09-07 DIAGNOSIS — G893 Neoplasm related pain (acute) (chronic): Secondary | ICD-10-CM

## 2017-09-07 DIAGNOSIS — C50012 Malignant neoplasm of nipple and areola, left female breast: Principal | ICD-10-CM

## 2017-09-07 DIAGNOSIS — C50011 Malignant neoplasm of nipple and areola, right female breast: Secondary | ICD-10-CM

## 2017-09-07 DIAGNOSIS — K5900 Constipation, unspecified: Secondary | ICD-10-CM | POA: Diagnosis not present

## 2017-09-07 DIAGNOSIS — C7951 Secondary malignant neoplasm of bone: Secondary | ICD-10-CM

## 2017-09-07 DIAGNOSIS — C50919 Malignant neoplasm of unspecified site of unspecified female breast: Secondary | ICD-10-CM

## 2017-09-07 LAB — COMPREHENSIVE METABOLIC PANEL
ALBUMIN: 2.2 g/dL — AB (ref 3.5–5.0)
ALT: 16 U/L (ref 0–55)
AST: 49 U/L — AB (ref 5–34)
Alkaline Phosphatase: 101 U/L (ref 40–150)
Anion gap: 12 — ABNORMAL HIGH (ref 3–11)
BUN: 33 mg/dL — AB (ref 7–26)
CHLORIDE: 87 mmol/L — AB (ref 98–109)
CO2: 33 mmol/L — AB (ref 22–29)
CREATININE: 1.13 mg/dL — AB (ref 0.60–1.10)
Calcium: 9 mg/dL (ref 8.4–10.4)
GFR calc Af Amer: 58 mL/min — ABNORMAL LOW (ref >60)
GFR, EST NON AFRICAN AMERICAN: 50 mL/min — AB (ref >60)
Glucose, Bld: 103 mg/dL (ref 70–140)
POTASSIUM: 4.2 mmol/L (ref 3.5–5.1)
Sodium: 132 mmol/L — ABNORMAL LOW (ref 136–145)
Total Bilirubin: 0.8 mg/dL (ref 0.2–1.2)
Total Protein: 6.4 g/dL (ref 6.4–8.3)

## 2017-09-07 LAB — CBC WITH DIFFERENTIAL/PLATELET
BASOS ABS: 0 10*3/uL (ref 0.0–0.1)
BASOS PCT: 0 %
EOS PCT: 0 %
Eosinophils Absolute: 0 10*3/uL (ref 0.0–0.5)
HCT: 24.8 % — ABNORMAL LOW (ref 34.8–46.6)
Hemoglobin: 8.1 g/dL — ABNORMAL LOW (ref 11.6–15.9)
LYMPHS PCT: 9 %
Lymphs Abs: 1.4 10*3/uL (ref 0.9–3.3)
MCH: 26.3 pg (ref 25.1–34.0)
MCHC: 32.7 g/dL (ref 31.5–36.0)
MCV: 80.5 fL (ref 79.5–101.0)
MONO ABS: 1 10*3/uL — AB (ref 0.1–0.9)
Monocytes Relative: 7 %
NEUTROS ABS: 12.9 10*3/uL — AB (ref 1.5–6.5)
Neutrophils Relative %: 84 %
PLATELETS: 394 10*3/uL (ref 145–400)
RBC: 3.08 MIL/uL — AB (ref 3.70–5.45)
RDW: 18.9 % — AB (ref 11.2–14.5)
WBC: 15.4 10*3/uL — AB (ref 3.9–10.3)

## 2017-09-07 LAB — PREPARE RBC (CROSSMATCH)

## 2017-09-07 LAB — SAMPLE TO BLOOD BANK

## 2017-09-07 LAB — TSH: TSH: 1.333 u[IU]/mL (ref 0.308–3.960)

## 2017-09-07 MED ORDER — SODIUM CHLORIDE 0.9% FLUSH
10.0000 mL | INTRAVENOUS | Status: DC | PRN
  Filled 2017-09-07: qty 10

## 2017-09-07 MED ORDER — LACTULOSE 10 GM/15ML PO SOLN: 30.0000 g | Freq: Two times a day (BID) | ORAL | 0 refills | Status: AC | PRN

## 2017-09-07 MED ORDER — SODIUM CHLORIDE 0.9% FLUSH
3.0000 mL | INTRAVENOUS | Status: DC | PRN
  Filled 2017-09-07: qty 10

## 2017-09-07 MED ORDER — SODIUM CHLORIDE 0.9% FLUSH
10.0000 mL | INTRAVENOUS | Status: DC | PRN
  Administered 2017-09-07: 10 mL via INTRAVENOUS
  Filled 2017-09-07: qty 10

## 2017-09-07 MED ORDER — HEPARIN SOD (PORK) LOCK FLUSH 100 UNIT/ML IV SOLN
250.0000 [IU] | INTRAVENOUS | Status: DC | PRN
  Filled 2017-09-07: qty 5

## 2017-09-07 MED ORDER — SODIUM CHLORIDE 0.9 % IV SOLN: 250.0000 mL | Freq: Once | INTRAVENOUS | Status: DC

## 2017-09-07 NOTE — Telephone Encounter (Signed)
[place] CSW Progress Note  Call from [facility] [employer]  APS worker will call [employer] to stress need for [place].  Was unable to complete welfare check on patient on home despite scheduling visit in advance.  Patient did not come to the door, worker unsure if patient was home.  APS worker will make another visit next week, wants to talk w family contacts for collateral information but has been unable to do so.  Edwyna Shell, LCSW Clinical Social Worker Phone:  [phone]

## 2017-09-07 NOTE — Telephone Encounter (Addendum)
[place] CSW Progress Note  [place] of [place], provider that needs to assess patient's eligibility for [place].  [place] tried to reach APS worker and was unable to do so within 24 hours, so denied second request for services due to inability to reach worker.  CSW insisted that [facility] assist patient w application for [place] as need is urgent.  At [place] request, provider will be calling patient to schedule assessment within the week.  CSW will continue to track this referral to ensure that patient's needs are met.  CSW also notes that patient's voice mailbox was full when [employer] tried to reach patient.  CSW updated provider re issues w access to patient, communicated w APS worker Avis Epley and asked her to contact [place].    Edwyna Shell, LCSW Clinical Social Worker Phone:  [phone]

## 2017-09-07 NOTE — Progress Notes (Signed)
Per [name], NP pt to receive 1 unit of prbc tomorrow for hg 8.1. Called and notified blood bank of order and confirmed appt for blood transfusion tomorrow with pt.

## 2017-09-08 ENCOUNTER — Inpatient Hospital Stay: Payer: Medicare Other

## 2017-09-08 DIAGNOSIS — C770 Secondary and unspecified malignant neoplasm of lymph nodes of head, face and neck: Secondary | ICD-10-CM | POA: Diagnosis not present

## 2017-09-08 DIAGNOSIS — C50011 Malignant neoplasm of nipple and areola, right female breast: Secondary | ICD-10-CM | POA: Diagnosis not present

## 2017-09-08 DIAGNOSIS — C50919 Malignant neoplasm of unspecified site of unspecified female breast: Secondary | ICD-10-CM

## 2017-09-08 MED ORDER — SODIUM CHLORIDE 0.9 % IV SOLN
250.0000 mL | Freq: Once | INTRAVENOUS | Status: AC
  Administered 2017-09-08: 250 mL via INTRAVENOUS

## 2017-09-08 MED ORDER — HEPARIN SOD (PORK) LOCK FLUSH 100 UNIT/ML IV SOLN
500.0000 [IU] | Freq: Every day | INTRAVENOUS | Status: AC | PRN
  Administered 2017-09-08: 500 [IU]
  Filled 2017-09-08: qty 5

## 2017-09-08 MED ORDER — ACETAMINOPHEN 325 MG PO TABS
ORAL_TABLET | ORAL | Status: AC
  Filled 2017-09-08: qty 2

## 2017-09-08 MED ORDER — ACETAMINOPHEN 325 MG PO TABS
650.0000 mg | ORAL_TABLET | Freq: Once | ORAL | Status: AC
  Administered 2017-09-08: 650 mg via ORAL

## 2017-09-08 MED ORDER — SODIUM CHLORIDE 0.9% FLUSH
10.0000 mL | INTRAVENOUS | Status: AC | PRN
  Administered 2017-09-08: 10 mL
  Filled 2017-09-08: qty 10

## 2017-09-08 MED ORDER — DIPHENHYDRAMINE HCL 25 MG PO CAPS
25.0000 mg | ORAL_CAPSULE | Freq: Once | ORAL | Status: AC
  Administered 2017-09-08: 25 mg via ORAL

## 2017-09-08 MED ORDER — DIPHENHYDRAMINE HCL 25 MG PO CAPS
ORAL_CAPSULE | ORAL | Status: AC
  Filled 2017-09-08: qty 1

## 2017-09-08 NOTE — Patient Instructions (Signed)

## 2017-09-08 NOTE — Progress Notes (Signed)
Pt. Came in today for blood transfusion upon arrival Pt's transporter Roland Rack) informed us that the police had to come to her home and break the window to get in her home to help her off the floor.Pt. Stated that she did not fall she was going to the bathroom and slid down on the floor. Pt stated she did not hit her head no bruises present. Pt refuses to go to the emergency room at [facility] but stated she would [facility] to [facility] after her blood transfusion. Roland Rack Pt's transportation stated that she would take her to [facility] to get Pt. Checked out. Pt verbalized this information. Left with transporter in a wheel chair.

## 2017-09-09 LAB — BPAM RBC
Blood Product Expiration Date: 201902142359
ISSUE DATE / TIME: 201902090918
Unit Type and Rh: 7300

## 2017-09-09 LAB — TYPE AND SCREEN
ABO/RH(D): AB POS
ANTIBODY SCREEN: NEGATIVE
DONOR AG TYPE: NEGATIVE
Unit division: 0

## 2017-09-11 ENCOUNTER — Telehealth: Payer: Self-pay | Admitting: General Practice

## 2017-09-11 NOTE — Telephone Encounter (Signed)
[facility] CSW Progress Note  Spoke w patient, patient denies any "fall" - "they need to tell the truth."  Insists that she was "on the floor praying, sometimes I get up in the middle of the night and get on my knees."  Antony Madura, Adult Materials engineer, plans to make home visit within the next 48 hours.  CSW also spoke w [facility] to determine status of referral for [place] - completed application has been received.  Provider reached out to patient yesterday to schedule appointment, will make two additional attempts.  They have been unable to leave message as patient's VM is full.  CSW problem solved w Genella Rife gave CSW # for scheduler [phone] Merrilee Seashore); asks that patient contact [place] directly.  Provided information to patient, encouraged patient to call.  Edwyna Shell, LCSW Clinical Social Worker Phone:  [phone]

## 2017-09-13 ENCOUNTER — Telehealth: Payer: Self-pay | Admitting: General Practice

## 2017-09-13 NOTE — Telephone Encounter (Signed)
[place] CSW Progress Note  Call to patient to determine whether [employer] has scheduled in home assessment for [place] (PCS).  Per patient, they have not called.  CSW gave patient contact information for [facility] again, patient states she will call.  CSW also called Merrilee Seashore at [place], requested that he reach out to patient and CSW to coordinate scheduling required in home assessment for determination of service eligibility.  Edwyna Shell, LCSW Clinical Social Worker Phone:  [phone]

## 2017-09-14 ENCOUNTER — Inpatient Hospital Stay (HOSPITAL_BASED_OUTPATIENT_CLINIC_OR_DEPARTMENT_OTHER): Payer: Medicare Other | Admitting: Hematology and Oncology

## 2017-09-14 ENCOUNTER — Inpatient Hospital Stay: Payer: Medicare Other

## 2017-09-14 ENCOUNTER — Telehealth: Payer: Self-pay

## 2017-09-14 VITALS — BP 95/64 | HR 92 | Temp 97.6°F | Resp 18 | Ht 62.0 in | Wt 101.6 lb

## 2017-09-14 DIAGNOSIS — C7989 Secondary malignant neoplasm of other specified sites: Secondary | ICD-10-CM | POA: Diagnosis not present

## 2017-09-14 DIAGNOSIS — R5383 Other fatigue: Secondary | ICD-10-CM | POA: Diagnosis not present

## 2017-09-14 DIAGNOSIS — C50011 Malignant neoplasm of nipple and areola, right female breast: Secondary | ICD-10-CM | POA: Diagnosis not present

## 2017-09-14 DIAGNOSIS — C7951 Secondary malignant neoplasm of bone: Secondary | ICD-10-CM

## 2017-09-14 DIAGNOSIS — E876 Hypokalemia: Secondary | ICD-10-CM

## 2017-09-14 DIAGNOSIS — C50012 Malignant neoplasm of nipple and areola, left female breast: Secondary | ICD-10-CM | POA: Diagnosis not present

## 2017-09-14 DIAGNOSIS — R197 Diarrhea, unspecified: Secondary | ICD-10-CM

## 2017-09-14 DIAGNOSIS — D649 Anemia, unspecified: Secondary | ICD-10-CM

## 2017-09-14 DIAGNOSIS — C7801 Secondary malignant neoplasm of right lung: Secondary | ICD-10-CM

## 2017-09-14 DIAGNOSIS — C541 Malignant neoplasm of endometrium: Secondary | ICD-10-CM

## 2017-09-14 DIAGNOSIS — C50919 Malignant neoplasm of unspecified site of unspecified female breast: Secondary | ICD-10-CM

## 2017-09-14 DIAGNOSIS — C770 Secondary and unspecified malignant neoplasm of lymph nodes of head, face and neck: Secondary | ICD-10-CM

## 2017-09-14 LAB — CBC WITH DIFFERENTIAL/PLATELET
Basophils Absolute: 0 10*3/uL (ref 0.0–0.1)
Basophils Relative: 0 %
EOS ABS: 0 10*3/uL (ref 0.0–0.5)
EOS PCT: 0 %
HCT: 26.9 % — ABNORMAL LOW (ref 34.8–46.6)
Hemoglobin: 8.7 g/dL — ABNORMAL LOW (ref 11.6–15.9)
Lymphocytes Relative: 7 %
Lymphs Abs: 1 10*3/uL (ref 0.9–3.3)
MCH: 25.8 pg (ref 25.1–34.0)
MCHC: 32.4 g/dL (ref 31.5–36.0)
MCV: 79.5 fL (ref 79.5–101.0)
Monocytes Absolute: 0.7 10*3/uL (ref 0.1–0.9)
Monocytes Relative: 5 %
Neutro Abs: 12.2 10*3/uL — ABNORMAL HIGH (ref 1.5–6.5)
Neutrophils Relative %: 88 %
PLATELETS: 335 10*3/uL (ref 145–400)
RBC: 3.38 MIL/uL — AB (ref 3.70–5.45)
RDW: 20 % — ABNORMAL HIGH (ref 11.2–14.5)
WBC: 14 10*3/uL — AB (ref 3.9–10.3)

## 2017-09-14 LAB — COMPREHENSIVE METABOLIC PANEL
ALT: 26 U/L (ref 0–55)
AST: 87 U/L — AB (ref 5–34)
Albumin: 2 g/dL — ABNORMAL LOW (ref 3.5–5.0)
Alkaline Phosphatase: 304 U/L — ABNORMAL HIGH (ref 40–150)
Anion gap: 13 — ABNORMAL HIGH (ref 3–11)
BILIRUBIN TOTAL: 1 mg/dL (ref 0.2–1.2)
BUN: 21 mg/dL (ref 7–26)
CO2: 37 mmol/L — ABNORMAL HIGH (ref 22–29)
CREATININE: 0.79 mg/dL (ref 0.60–1.10)
Calcium: 9 mg/dL (ref 8.4–10.4)
Chloride: 83 mmol/L — ABNORMAL LOW (ref 98–109)
GFR calc Af Amer: >60 mL/min (ref >60)
Glucose, Bld: 82 mg/dL (ref 70–140)
POTASSIUM: 2.7 mmol/L — AB (ref 3.5–5.1)
Sodium: 133 mmol/L — ABNORMAL LOW (ref 136–145)
TOTAL PROTEIN: 6.5 g/dL (ref 6.4–8.3)

## 2017-09-14 LAB — SAMPLE TO BLOOD BANK

## 2017-09-14 LAB — TSH: TSH: 0.52 u[IU]/mL (ref 0.308–3.960)

## 2017-09-14 MED ORDER — BUMETANIDE 1 MG PO TABS: 1.0000 mg | ORAL_TABLET | Freq: Two times a day (BID) | ORAL | 0 refills | Status: DC

## 2017-09-14 MED ORDER — MORPHINE SULFATE 30 MG PO TABS: 30.0000 mg | ORAL_TABLET | Freq: Four times a day (QID) | ORAL | 0 refills | Status: DC | PRN

## 2017-09-14 MED ORDER — HEPARIN SOD (PORK) LOCK FLUSH 100 UNIT/ML IV SOLN
500.0000 [IU] | Freq: Once | INTRAVENOUS | Status: AC | PRN
  Administered 2017-09-14: 500 [IU]
  Filled 2017-09-14: qty 5

## 2017-09-14 MED ORDER — SODIUM CHLORIDE 0.9 % IV SOLN
Freq: Once | INTRAVENOUS | Status: DC
  Administered 2017-09-14: 14:00:00 via INTRAVENOUS
  Filled 2017-09-14: qty 1000

## 2017-09-14 MED ORDER — SODIUM CHLORIDE 0.9% FLUSH
10.0000 mL | INTRAVENOUS | Status: DC | PRN
  Administered 2017-09-14: 10 mL via INTRAVENOUS
  Filled 2017-09-14: qty 10

## 2017-09-14 MED ORDER — SODIUM CHLORIDE 0.9 % IV SOLN
Freq: Once | INTRAVENOUS | Status: AC
  Administered 2017-09-14: 14:00:00 via INTRAVENOUS

## 2017-09-14 MED ORDER — NYSTATIN 100000 UNIT/ML MT SUSP: 5.0000 mL | Freq: Four times a day (QID) | OROMUCOSAL | 0 refills | Status: AC

## 2017-09-14 MED ORDER — HEPARIN SOD (PORK) LOCK FLUSH 100 UNIT/ML IV SOLN
500.0000 [IU] | Freq: Once | INTRAVENOUS | Status: AC
  Administered 2017-09-14: 500 [IU]
  Filled 2017-09-14: qty 5

## 2017-09-14 MED ORDER — SODIUM CHLORIDE 0.9 % IJ SOLN
10.0000 mL | Freq: Once | INTRAMUSCULAR | Status: AC
  Administered 2017-09-14: 10 mL
  Filled 2017-09-14: qty 10

## 2017-09-14 NOTE — Progress Notes (Signed)
Patient Care Team: [name], MD as PCP - General (Internal Medicine) Rothbart, Cristopher Estimable, MD (Cardiology) [name], MD as Consulting Physician (Gastroenterology) Delice Bison, Charlestine Massed, NP as Nurse Practitioner (Hematology and Oncology) Nicholas Lose, MD as Consulting Physician (Hematology and Oncology) Kyung Rudd, MD as Consulting Physician (Radiation Oncology) Alfonzo Feller, RN as [place] Management  DIAGNOSIS:  Encounter Diagnoses  Name Primary?  . Malignant neoplasm metastatic to right lung ([place]) Yes  . Bone metastases ([place])   . Metastasis to supraclavicular lymph node ([place])   . Endometrial cancer ([place])   . Bilateral malignant neoplasm of areola of breast in female, unspecified estrogen receptor status ([place])   . Hypokalemia     SUMMARY OF ONCOLOGIC HISTORY:   Bilateral breast cancer ([place])   07/23/2013 Mammogram    Bilateral breast masses. 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  N1 M0 stage IIB ER 100%, PR 0%, HER-2       06/17/2014 -  Radiation Therapy    Adjuvant radiation therapy      09/14/2014 -  Anti-estrogen oral therapy    Anastrozole 1 mg daily      05/28/2015 Imaging    CT scans: Enlarging subpectoral masses 3.1 x 3.5 cm, posteriorly lower density mass 4.5 x 2.1 cm, several right-sided lung nodules right lower lobe 1.4 cm, 3 other right lung nodules, 1.7 cm right iliac bone lesion      06/21/2015 Procedure    Left subpectoral mass biopsy: Invasive high-grade ductal carcinoma ER 5%, PR 0%, HER-2 negative ratio 1.29      09/01/2015 Imaging    Left chest wall mass increased in size 7.2 x 5.1 cm, multiple subcutaneous nodules, increase in the lung nodules both in number as well as in the size of existing nodules      09/10/2015 -  Chemotherapy    Carboplatin, gemcitabine days 1 and 8 q 3 weeks, carboplatin discontinued for neuropathy (treatment break from 01/20/2016 to 03/24/2016); Added Carboplatin back 04/14/16 (for progression)      01/26/2016 Imaging    Marked improvement in size of lung nodules with many of the nodules resolved index right lower lobe nodule 1.9 x 1.8 cm is now 0.7 x 0.6 cm, reduction in the size of left breast mass 7.2 cm down to 2.6 cm, enlargement in the hypodense mass in the uterus  04/06/2016 Imaging    CT chest: Interval progression of pre-existing lung nodules new lung metastases (34m, 167m 2.9 cm, 1.6 cm), interval progression of disease in the left breast 4.2 cm (was 2.6 cm), additional nodules 2.3 cm and 3.2 cm; CT head: scalp mass 1.9 cm      06/27/2016 Imaging    Ct chest: Stable lesions in lt breast deep to Left pectoralis, cystic lesion loc ant in left breast inc compared to previous; decrease in lung nodules (68m71mo 3 mm; 12 mm to 7 mm, RML nodule 2.9 cm to 1.8 cm, RLL 48m80m 7 mm)      11/14/2016 Imaging    CT CAP:RP and retrocrural lymphadenopathy, large uterine fibroids with necrosis; MRI Abd: RP  lymphadenopathy CT chest 11/20/16: Worsening bulky conglomeration of mediastinal and right hilar lymph nodes, lung nodules subcentimeter size slight increase, left breast mass was 3.7 x 5.8 cm now 5.4 x 7.4 cm      12/08/2016 - 04/06/2017 Chemotherapy    Palliative chemotherapy with [place] days 1 and 8 every 3 weeks stopped due to peripheral neuropathy       04/30/2017 - 05/18/2017 Radiation Therapy    Palliative radiation therapy to the scalp and the left chest wall lesion      06/11/2017 Imaging    Progression noted with new and progressing pulmonary nodules, increasing size of periaortic mass, increasing iliac adenopathy with mass lesion difficult to separate from adjacent psoas muscle, new 15mm68mule in the LUQ anterior to the colon.        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  INTERVAL HISTORY: Paula Navarro 66 ye86 old with metastatic breast cancer with a massively enlarged left chest wall mass which has eroded through the skin and it is bleeding and having evidence of necrotic tissue.  She also has a scalp lesion which is doing the same.  She is trying to change the dressings every other day.  She tells me that she has not been taking the olaparib as prescribed.  Sometimes she cannot remember to take it other times she does not want to take it.  She has been getting weaker and more tired.  In spite of all this she does not have any pain or discomfort in the chest wall.  She does have generalized aches and pains for which she takes morphine as needed.  She appears to be slowly getting weaker and performance  status appears to be getting worse. She [place] not [place] calls [place].  She was supposed to be set up with wound care nurse but if she does not answer her phone call it is difficult to arrange for this.  REVIEW OF SYSTEMS:   Constitutional: Progressively declining performance status, generalized weakness, peripheral neuropathy Eyes: Denies blurriness of vision Ears, nose, mouth, throat, and face: Denies mucositis or sore throat Respiratory: Denies cough, dyspnea or wheezes Cardiovascular: Denies palpitation, chest discomfort Gastrointestinal:  Denies nausea, heartburn or change in bowel habits Skin: Denies abnormal skin rashes Lymphatics: Denies new lymphadenopathy or easy bruising Neurological: Peripheral neuropathy Behavioral/Psych: Mood is stable, no  new changes  Extremities: No lower extremity edema Breast: Severely necrotic and bleeding left chest wall tumor.  Scalp lesion also necrotic and bleeding. All other systems were reviewed with the patient and are negative.  I have reviewed the past medical history, past surgical history, social history and family history with the patient and they are unchanged from previous note.  ALLERGIES:  is allergic to codeine; morphine and related; and oxycodone hcl.  MEDICATIONS:  Current Outpatient Medications  Medication Sig Dispense Refill  . acetaminophen (TYLENOL) 500 MG tablet Take 500 mg by mouth 3 (three) times daily as needed (BREAKTHROUGH PAIN).     Marland Kitchen bumetanide (BUMEX) 1 MG tablet Take 1 tablet (1 mg total) by mouth 2 (two) times daily. 60 tablet 0  . Calcium Carb-Cholecalciferol (CALCIUM 500/D) 500-400 MG-UNIT CHEW Chew 1 tablet by mouth daily.    Marland Kitchen dicyclomine (BENTYL) 20 MG tablet Take 1 tablet (20 mg total) by mouth 2 (two) times daily as needed for spasms. 20 tablet 0  . docusate sodium (COLACE) 100 MG capsule Take 1 capsule (100 mg total) by mouth 2 (two) times daily. (Patient taking differently: Take 100 mg  by mouth daily. ) 10 capsule 0  . fentaNYL (DURAGESIC - DOSED MCG/HR) 50 MCG/HR Place 1 patch (50 mcg total) onto the skin every 3 (three) days. 10 patch 0  . gabapentin (NEURONTIN) 300 MG capsule TAKE 2 CAPSULES (600 MG TOTAL) BY MOUTH 3 (THREE) TIMES DAILY. 180 capsule 2  . lactulose (CHRONULAC) 10 GM/15ML solution Take 45 mLs (30 g total) by mouth 2 (two) times daily as needed for moderate constipation or severe constipation. 240 mL 0  . lidocaine-prilocaine (EMLA) cream APPLY TO AFFECTED AREA ONCE 30 g 3  . LYNPARZA 100 MG tablet   0  . morphine (MSIR) 30 MG tablet Take 1 tablet (30 mg total) by mouth every 6 (six) hours as needed for severe pain. 120 tablet 0  . nystatin (MYCOSTATIN) 100000 UNIT/ML suspension Take 5 mLs (500,000 Units total) by mouth 4 (four) times daily. 60 mL 0  . ondansetron (ZOFRAN) 4 MG tablet Take 1 tablet (4 mg total) by mouth every 6 (six) hours. 12 tablet 0  . polyethylene glycol (MIRALAX / GLYCOLAX) packet Take 17 g by mouth 2 (two) times daily. 14 each 0  . potassium chloride SA (K-DUR,KLOR-CON) 20 MEQ tablet Take 1 tablet (20 mEq total) by mouth daily. 30 tablet 0  . SUPER B COMPLEX/C PO Take 1 tablet by mouth daily.    Marland Kitchen zolpidem (AMBIEN) 10 MG tablet TAKE ONE TABLET BY MOUTH AT BEDTIME AS NEEDED FOR SLEEP 30 tablet 0   Current Facility-Administered Medications  Medication Dose Route Frequency Provider Last Rate Last Dose  . sodium chloride 0.9 % 1,000 mL with potassium chloride 20 mEq infusion   Intravenous Once Nicholas Lose, MD        PHYSICAL EXAMINATION: ECOG PERFORMANCE STATUS: 2 - Symptomatic, <50% confined to bed  Vitals:   09/14/17 1130  BP: 95/64  Pulse: 92  Resp: 18  Temp: 97.6 F (36.4 C)  SpO2: 97%   Filed Weights   09/14/17 1130  Weight: 101 lb 9.6 oz (46.1 kg)    GENERAL: Looks generally weak and fatigued SKIN: skin color, texture, turgor are normal, no rashes or significant lesions, scalp lesion there is necrotic and  fungating EYES: normal, Conjunctiva are pink and non-injected, sclera clear OROPHARYNX:no exudate, no erythema and lips, buccal mucosa, and tongue normal  NECK: supple,  thyroid normal size, non-tender, without nodularity LYMPH:  no palpable lymphadenopathy in the cervical, axillary or inguinal LUNGS: clear to auscultation and percussion with normal breathing effort HEART: regular rate & rhythm and no murmurs and no lower extremity edema ABDOMEN:abdomen soft, non-tender and normal bowel sounds MUSCULOSKELETAL:no cyanosis of digits and no clubbing  NEURO: alert & oriented x 3 with fluent speech, peripheral neuropathy grade 2 EXTREMITIES: No lower extremity edema BREAST: Necrotic fungating left chest wall tumor (exam performed in the presence of a chaperone)  LABORATORY DATA:  I have reviewed the data as listed CMP Latest Ref Rng & Units 09/14/2017 09/07/2017 09/06/2017  Glucose 70 - 140 mg/dL 82 103 101(H)  BUN 7 - 26 mg/dL 21 33(H) 31(H)  Creatinine 0.60 - 1.10 mg/dL 0.79 1.13(H) 1.06(H)  Sodium 136 - 145 mmol/L 133(L) 132(L) 130(L)  Potassium 3.5 - 5.1 mmol/L 2.7(LL) 4.2 3.2(L)  Chloride 98 - 109 mmol/L 83(L) 87(L) 84(L)  CO2 22 - 29 mmol/L 37(H) 33(H) 31  Calcium 8.4 - 10.4 mg/dL 9.0 9.0 8.6(L)  Total Protein 6.4 - 8.3 g/dL 6.5 6.4 6.3(L)  Total Bilirubin 0.2 - 1.2 mg/dL 1.0 0.8 1.4(H)  Alkaline Phos 40 - 150 U/L 304(H) 101 80  AST 5 - 34 U/L 87(H) 49(H) 45(H)  ALT 0 - 55 U/L 26 16 19     Lab Results  Component Value Date   WBC 14.0 (H) 09/14/2017   HGB 8.7 (L) 09/14/2017   HCT 26.9 (L) 09/14/2017   MCV 79.5 09/14/2017   PLT 335 09/14/2017   NEUTROABS 12.2 (H) 09/14/2017    ASSESSMENT & PLAN:  Bilateral breast cancer (HCC) Bilateral breast cancers are usually diagnosed 2015 treated with surgery, radiation, anastrozole Metastatic breast cancer diagnosed 05/28/2015 as subpectoral mass, ER 5%, PR 0%, HER-2 negative, failed Xeloda and carboplatin and gemcitabine  Current  Treatment: Olaparib patient has been extremely noncompliant in taking olaparib as prescribed.  Toxicities:  Diarrhea: Patient has not been taking Imodium as recommended severe fatigue: Probably due to anemia and progression of metastatic breast cancer Severe hypokalemia: Potassium and IV fluid replacement today. Severe Anemia:Transfusions as needed.  She does not need blood transfusion today..  Goals of care: Ideally patient should be in hospice care.  Pain Management: Morphine + Duragesic patch. However patient would like to come to see [place] once a week to get blood transfusion as this has been helping her symptoms. Because of this she has not accepted hospice yet. Patient fully understands that she is actively dying and that she needs to be prepared for the worst.  Return to clinic in 1 week for blood work and follow-up.  I spent 25 minutes talking to the patient of which more than half was spent in counseling and coordination of care.  No orders of the defined types were placed in this encounter.  The patient has a good understanding of the overall plan. she agrees with it. she will call with any problems that may develop before the next visit here.   Harriette Ohara, MD 09/14/17

## 2017-09-14 NOTE — Progress Notes (Signed)
[facility] CSW Progress Note  CSW met w patient in clinic, facilitated call to [facility] [place] [phone]).  Patient agreed to in home assessment for Personal Care Services needs.  Izora Gala RN to come to patient's home on 09/19/17 12:30 - 1:30 PM.  Patient given written summary of upcoming appointment, encouraged to post in a prominent place in her home as reminder of upcoming visit.  Can have a family member present during assessment, plans to ask daughter to attend.  Edwyna Shell, LCSW Clinical Social Worker Phone:  [phone]

## 2017-09-14 NOTE — Patient Instructions (Signed)

## 2017-09-14 NOTE — Telephone Encounter (Signed)
Contacted [facility] to notify them that pt will not need to be here tomorrow. She does not need blood transfusion.

## 2017-09-14 NOTE — Assessment & Plan Note (Signed)
Bilateral breast cancers are usually diagnosed 2015 treated with surgery, radiation, anastrozole Metastatic breast cancer diagnosed 05/28/2015 as subpectoral mass, ER 5%, PR 0%, HER-2 negative, failed Xeloda and carboplatin and gemcitabine  Current Treatment: Olaparib Toxicities:  Diarrhea: Instructed to take Imodium  severe fatigue: Probably due to anemia  Severe Anemia:Transfusions as needed.  She does not need blood transfusion today.. Pain Management: Morphine + Duragesic patch.  Return to clinic in 1 week for blood work and follow-up.

## 2017-09-14 NOTE — Telephone Encounter (Signed)
Patient at facility today for appt.  Dressing to wound on head noted need for change.  Nurse changed dressing using normal saline, Vaseline gauze, Telfa and paper tape. Wound area cleaned with normal saline.  Noted were some areas of gauze still adheared to wound area.  Patient was made aware of this and she stated that "it's ok, I'll get the rest of it when I get home".  Dressing was secured with above.  No c/o pain to wound area, no drainage and no bleeding noted.  Patient knows to call center with any issues.

## 2017-09-18 ENCOUNTER — Other Ambulatory Visit: Payer: Self-pay

## 2017-09-18 ENCOUNTER — Other Ambulatory Visit: Payer: Self-pay | Admitting: *Deleted

## 2017-09-18 ENCOUNTER — Encounter: Payer: Self-pay | Admitting: *Deleted

## 2017-09-18 MED ORDER — BUMETANIDE 1 MG PO TABS: 1.0000 mg | ORAL_TABLET | Freq: Two times a day (BID) | ORAL | 3 refills | Status: AC

## 2017-09-18 MED ORDER — POTASSIUM CHLORIDE CRYS ER 20 MEQ PO TBCR: 20.0000 meq | EXTENDED_RELEASE_TABLET | Freq: Every day | ORAL | 3 refills | Status: AC

## 2017-09-18 NOTE — Patient Outreach (Signed)
[name]) Care Management  09/18/2017  Paula Navarro April 07, 1951 [id number]   Telephone screening call 3rd attempt  Unsuccessful telephone attempt,placed call to patient contact number, no answer able to leave a HIPAA compliant message.  No response from patient since outreach letter sent on 09/01/17  No response at  3rd call attempt today.  Will close case per workflow. Will send CMA case closure message .  Will send MD case closure letter, unable to contact patient.    Joylene Draft, RN, [place] Management Coordinator  [phone]- Mobile [phone]- Toll Free Main Office

## 2017-09-20 NOTE — Assessment & Plan Note (Signed)
Bilateral breast cancers are usually diagnosed 2015 treated with surgery, radiation, anastrozole Metastatic breast cancer diagnosed 05/28/2015 as subpectoral mass, ER 5%, PR 0%, HER-2 negative, failed Xeloda and carboplatin and gemcitabine  Current Treatment: Olaparib Toxicities:  Diarrhea: Instructed to take Imodium  severe fatigue: Probably due to anemia  Severe Anemia:Transfusions as needed.  She does not need blood transfusion today.. Pain Management: Morphine + Duragesic patch.  Return to clinic in 1 week for blood work and follow-up.   

## 2017-09-21 ENCOUNTER — Inpatient Hospital Stay (HOSPITAL_BASED_OUTPATIENT_CLINIC_OR_DEPARTMENT_OTHER): Payer: Medicare Other | Admitting: Hematology and Oncology

## 2017-09-21 ENCOUNTER — Other Ambulatory Visit: Payer: Self-pay

## 2017-09-21 ENCOUNTER — Telehealth: Payer: Self-pay | Admitting: General Practice

## 2017-09-21 ENCOUNTER — Inpatient Hospital Stay: Payer: Medicare Other

## 2017-09-21 DIAGNOSIS — C50012 Malignant neoplasm of nipple and areola, left female breast: Secondary | ICD-10-CM | POA: Diagnosis not present

## 2017-09-21 DIAGNOSIS — R197 Diarrhea, unspecified: Secondary | ICD-10-CM | POA: Diagnosis not present

## 2017-09-21 DIAGNOSIS — R5383 Other fatigue: Secondary | ICD-10-CM | POA: Diagnosis not present

## 2017-09-21 DIAGNOSIS — C50011 Malignant neoplasm of nipple and areola, right female breast: Secondary | ICD-10-CM | POA: Diagnosis not present

## 2017-09-21 DIAGNOSIS — C7951 Secondary malignant neoplasm of bone: Secondary | ICD-10-CM

## 2017-09-21 DIAGNOSIS — G893 Neoplasm related pain (acute) (chronic): Secondary | ICD-10-CM | POA: Diagnosis not present

## 2017-09-21 DIAGNOSIS — C770 Secondary and unspecified malignant neoplasm of lymph nodes of head, face and neck: Secondary | ICD-10-CM

## 2017-09-21 DIAGNOSIS — C7989 Secondary malignant neoplasm of other specified sites: Secondary | ICD-10-CM

## 2017-09-21 DIAGNOSIS — C50919 Malignant neoplasm of unspecified site of unspecified female breast: Secondary | ICD-10-CM

## 2017-09-21 LAB — CBC WITH DIFFERENTIAL/PLATELET
BASOS ABS: 0 10*3/uL (ref 0.0–0.1)
BASOS PCT: 0 %
Eosinophils Absolute: 0 10*3/uL (ref 0.0–0.5)
Eosinophils Relative: 0 %
HEMATOCRIT: 22.9 % — AB (ref 34.8–46.6)
HEMOGLOBIN: 7.4 g/dL — AB (ref 11.6–15.9)
LYMPHS PCT: 10 %
Lymphs Abs: 1.3 10*3/uL (ref 0.9–3.3)
MCH: 25.5 pg (ref 25.1–34.0)
MCHC: 32.3 g/dL (ref 31.5–36.0)
MCV: 79 fL — ABNORMAL LOW (ref 79.5–101.0)
MONO ABS: 0.6 10*3/uL (ref 0.1–0.9)
Monocytes Relative: 5 %
NEUTROS ABS: 11.6 10*3/uL — AB (ref 1.5–6.5)
NEUTROS PCT: 85 %
Platelets: 292 10*3/uL (ref 145–400)
RBC: 2.9 MIL/uL — AB (ref 3.70–5.45)
RDW: 18.9 % — ABNORMAL HIGH (ref 11.2–14.5)
WBC: 13.5 10*3/uL — ABNORMAL HIGH (ref 3.9–10.3)
nRBC: 0 /100 WBC

## 2017-09-21 LAB — COMPREHENSIVE METABOLIC PANEL
ALBUMIN: 2 g/dL — AB (ref 3.5–5.0)
ALK PHOS: 108 U/L (ref 40–150)
ALT: 14 U/L (ref 0–55)
AST: 32 U/L (ref 5–34)
Anion gap: 13 — ABNORMAL HIGH (ref 3–11)
BILIRUBIN TOTAL: 0.5 mg/dL (ref 0.2–1.2)
BUN: 32 mg/dL — ABNORMAL HIGH (ref 7–26)
CALCIUM: 9.7 mg/dL (ref 8.4–10.4)
CO2: 32 mmol/L — AB (ref 22–29)
Chloride: 84 mmol/L — ABNORMAL LOW (ref 98–109)
Creatinine, Ser: 1.25 mg/dL — ABNORMAL HIGH (ref 0.60–1.10)
GFR calc non Af Amer: 44 mL/min — ABNORMAL LOW (ref >60)
GFR, EST AFRICAN AMERICAN: 52 mL/min — AB (ref >60)
GLUCOSE: 141 mg/dL — AB (ref 70–140)
POTASSIUM: 4.2 mmol/L (ref 3.5–5.1)
SODIUM: 129 mmol/L — AB (ref 136–145)
TOTAL PROTEIN: 6.2 g/dL — AB (ref 6.4–8.3)

## 2017-09-21 LAB — SAMPLE TO BLOOD BANK

## 2017-09-21 LAB — TSH: TSH: 0.987 u[IU]/mL (ref 0.308–3.960)

## 2017-09-21 MED ORDER — ADHESIVE PAPER TAPE: MEDICATED_TAPE | 1 refills | Status: DC

## 2017-09-21 MED ORDER — "TELFA NON-ADHERENT 3""X8"" PADS": MEDICATED_PAD | 1 refills | Status: AC

## 2017-09-21 MED ORDER — SODIUM CHLORIDE 0.9 % EX SOLN: CUTANEOUS | 1 refills | Status: DC

## 2017-09-21 MED ORDER — SODIUM CHLORIDE 0.9 % EX SOLN: CUTANEOUS | 1 refills | Status: AC

## 2017-09-21 MED ORDER — "TELFA NON-ADHERENT 3""X8"" PADS": MEDICATED_PAD | 1 refills | Status: DC

## 2017-09-21 MED ORDER — VITAMINS A & D EX OINT: 1.0000 "application " | TOPICAL_OINTMENT | CUTANEOUS | 1 refills | Status: DC | PRN

## 2017-09-21 MED ORDER — "GAUZE DRESSING 4""X4"" PADS": MEDICATED_PAD | 1 refills | Status: AC

## 2017-09-21 MED ORDER — SODIUM CHLORIDE 0.9% FLUSH
10.0000 mL | INTRAVENOUS | Status: DC | PRN
  Administered 2017-09-21: 10 mL via INTRAVENOUS
  Filled 2017-09-21: qty 10

## 2017-09-21 MED ORDER — HEPARIN SOD (PORK) LOCK FLUSH 100 UNIT/ML IV SOLN
500.0000 [IU] | Freq: Once | INTRAVENOUS | Status: DC | PRN
  Filled 2017-09-21: qty 5

## 2017-09-21 MED ORDER — ADHESIVE PAPER TAPE: MEDICATED_TAPE | 1 refills | Status: AC

## 2017-09-21 MED ORDER — "GAUZE DRESSING 4""X4"" PADS": MEDICATED_PAD | 1 refills | Status: DC

## 2017-09-21 MED ORDER — VITAMINS A & D EX OINT: 1.0000 "application " | TOPICAL_OINTMENT | CUTANEOUS | 1 refills | Status: AC | PRN

## 2017-09-21 NOTE — Progress Notes (Signed)
Patient Care Team: Paula Gravel, MD as PCP - General (Internal Medicine) Rothbart, Cristopher Estimable, MD (Cardiology) Paula Binder, MD as Consulting Physician (Gastroenterology) Paula Navarro, Paula Massed, NP as Nurse Practitioner (Hematology and Oncology) Paula Lose, MD as Consulting Physician (Hematology and Oncology) Paula Rudd, MD as Consulting Physician (Radiation Oncology)  DIAGNOSIS:  Encounter Diagnosis  Name Primary?  . 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 N1 M0 stage IIB ER 100%, PR 0%, HER-2       06/17/2014 -  Radiation Therapy    Adjuvant radiation therapy      09/14/2014 -  Anti-estrogen oral therapy    Anastrozole 1 mg daily      05/28/2015 Imaging    CT scans: Enlarging subpectoral  masses 3.1 x 3.5 cm, posteriorly lower density mass 4.5 x 2.1 cm, several right-sided lung nodules right lower lobe 1.4 cm, 3 other right lung nodules, 1.7 cm right iliac bone lesion      06/21/2015 Procedure    Left subpectoral mass biopsy: Invasive high-grade ductal carcinoma ER 5%, PR 0%, HER-2 negative ratio 1.29      09/01/2015 Imaging    Left chest wall mass increased in size 7.2 x 5.1 cm, multiple subcutaneous nodules, increase in the lung nodules both in number as well as in the size of existing nodules      09/10/2015 -  Chemotherapy    Carboplatin, gemcitabine days 1 and 8 q 3 weeks, carboplatin discontinued for neuropathy (treatment break from 01/20/2016 to 03/24/2016); Added Carboplatin back 04/14/16 (for progression)      01/26/2016 Imaging    Marked improvement in size of lung nodules with many of the nodules resolved index right lower lobe nodule 1.9 x 1.8 cm is now 0.7 x 0.6 cm, reduction in the size of left breast mass 7.2 cm down to 2.6 cm, enlargement in the hypodense mass in the uterus      04/06/2016 Imaging    CT chest: Interval progression of pre-existing lung nodules new lung metastases (54m, 135m 2.9 cm, 1.6 cm), interval progression of disease in the left breast 4.2 cm (was 2.6 cm), additional nodules 2.3 cm and 3.2 cm;  CT head: scalp mass 1.9 cm      06/27/2016 Imaging    Ct chest: Stable lesions in lt breast deep to Left pectoralis, cystic lesion loc ant in left breast inc compared to previous; decrease in lung nodules (81m to 3 mm; 12 mm to 7 mm, RML nodule 2.9 cm to 1.8 cm, RLL 1103mto 7 mm)      11/14/2016 Imaging    CT CAP:RP and retrocrural lymphadenopathy, large uterine fibroids with necrosis; MRI Abd: RP lymphadenopathy CT chest 11/20/16: Worsening bulky conglomeration of mediastinal and right hilar lymph nodes, lung nodules subcentimeter size slight increase, left breast mass was 3.7 x 5.8 cm now 5.4 x 7.4 cm      12/08/2016 - 04/06/2017 Chemotherapy     Palliative chemotherapy with [place] days 1 and 8 every 3 weeks stopped due to peripheral neuropathy       04/30/2017 - 05/18/2017 Radiation Therapy    Palliative radiation therapy to the scalp and the left chest wall lesion      06/11/2017 Imaging    Progression noted with new and progressing pulmonary nodules, increasing size of periaortic mass, increasing iliac adenopathy with mass lesion difficult to separate from adjacent psoas muscle, new 1570module in the LUQ anterior to the colon.        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  She has required blood transfusions every other week.  Today her hemoglobin is 7.4.  Overall she appears to be doing moderately well.  She tells me that she is able to eat breakfast this morning and she ate that she is been able to clean her house.  Although she looks extremely frail and weak.  She is able to walk with the help of a walker.  She continues to have severe pain and discomfort related to the tumor.  Lately the tumor has not been bleeding.  She has an necrotic tumor in the left chest wall.  REVIEW OF SYSTEMS:   Constitutional: Denies fevers, chills or abnormal weight loss Eyes: Denies blurriness of vision, tumor on the scalp Ears, nose, mouth, throat, and face: Denies mucositis or sore throat Respiratory: Denies cough, dyspnea or wheezes Cardiovascular: Denies palpitation, chest  discomfort Gastrointestinal:  Denies nausea, heartburn or change in bowel habits Skin: Denies abnormal skin rashes Lymphatics: Denies new lymphadenopathy or easy bruising Neurological: Chronic peripheral neuropathy Behavioral/Psych: Mood is stable, no new changes  Extremities: No lower extremity edema Breast: Left chest wall necrotic tumor All other systems were reviewed with the patient and are negative.  I have reviewed the past medical history, past surgical history, social history and family history with the patient and they are unchanged from previous note.  ALLERGIES:  is allergic to codeine; morphine and related; and oxycodone hcl.  MEDICATIONS:  Current Outpatient Medications  Medication Sig Dispense Refill  . acetaminophen (TYLENOL) 500 MG tablet Take 500 mg by mouth 3 (three) times  daily as needed (BREAKTHROUGH PAIN).     . Adhesive Tape (ADHESIVE PAPER) TAPE Use for wound dressing as needed 1 each 1  . bumetanide (BUMEX) 1 MG tablet Take 1 tablet (1 mg total) by mouth 2 (two) times daily. 60 tablet 3  . Calcium Carb-Cholecalciferol (CALCIUM 500/D) 500-400 MG-UNIT CHEW Chew 1 tablet by mouth daily.    Marland Kitchen dicyclomine (BENTYL) 20 MG tablet Take 1 tablet (20 mg total) by mouth 2 (two) times daily as needed for spasms. 20 tablet 0  . docusate sodium (COLACE) 100 MG capsule Take 1 capsule (100 mg total) by mouth 2 (two) times daily. (Patient taking differently: Take 100 mg by mouth daily. ) 10 capsule 0  . fentaNYL (DURAGESIC - DOSED MCG/HR) 50 MCG/HR Place 1 patch (50 mcg total) onto the skin every 3 (three) days. 10 patch 0  . gabapentin (NEURONTIN) 300 MG capsule TAKE 2 CAPSULES (600 MG TOTAL) BY MOUTH 3 (THREE) TIMES DAILY. 180 capsule 2  . Gauze Pads & Dressings (GAUZE DRESSING) 4"X4" PADS Apply to wound as needed. 24 each 1  . Gauze Pads & Dressings (TELFA NON-ADHERENT DRESSING) 3"X8" PADS Apply directly to wound as needed. 50 each 1  . lactulose (CHRONULAC) 10 GM/15ML solution  Take 45 mLs (30 g total) by mouth 2 (two) times daily as needed for moderate constipation or severe constipation. 240 mL 0  . lidocaine-prilocaine (EMLA) cream APPLY TO AFFECTED AREA ONCE 30 g 3  . LYNPARZA 100 MG tablet   0  . morphine (MSIR) 30 MG tablet Take 1 tablet (30 mg total) by mouth every 6 (six) hours as needed for severe pain. 120 tablet 0  . nystatin (MYCOSTATIN) 100000 UNIT/ML suspension Take 5 mLs (500,000 Units total) by mouth 4 (four) times daily. 60 mL 0  . ondansetron (ZOFRAN) 4 MG tablet Take 1 tablet (4 mg total) by mouth every 6 (six) hours. 12 tablet 0  . polyethylene glycol (MIRALAX / GLYCOLAX) packet Take 17 g by mouth 2 (two) times daily. 14 each 0  . potassium chloride SA (K-DUR,KLOR-CON) 20 MEQ tablet Take 1 tablet (20 mEq total) by mouth daily. 30 tablet 3  . SODIUM CHLORIDE, EXTERNAL, 0.9 % SOLN Apply topically to wound as needed 210 mL 1  . SUPER B COMPLEX/C PO Take 1 tablet by mouth daily.    . Vitamins A & D (VITAMIN A & D) ointment Apply 1 application topically as needed for lip care. 45 g 1  . zolpidem (AMBIEN) 10 MG tablet TAKE ONE TABLET BY MOUTH AT BEDTIME AS NEEDED FOR SLEEP 30 tablet 0   No current facility-administered medications for this visit.    Facility-Administered Medications Ordered in Other Visits  Medication Dose Route Frequency Provider Last Rate Last Dose  . heparin lock flush 100 unit/mL  500 Units Intravenous Once PRN Paula Lose, MD      . sodium chloride flush (NS) 0.9 % injection 10 mL  10 mL Intravenous PRN Paula Lose, MD   10 mL at 09/21/17 1152    PHYSICAL EXAMINATION: ECOG PERFORMANCE STATUS: 2 - Symptomatic, <50% confined to bed  There were no vitals filed for this visit. There were no vitals filed for this visit.  GENERAL:alert, no distress and comfortable SKIN: skin color, texture, turgor are normal, no rashes or significant lesions EYES: normal, Conjunctiva are pink and non-injected, sclera clear OROPHARYNX:no  exudate, no erythema and lips, buccal mucosa, and tongue normal  NECK: supple, thyroid normal size, non-tender, without  nodularity LYMPH:  no palpable lymphadenopathy in the cervical, axillary or inguinal LUNGS: clear to auscultation and percussion with normal breathing effort, left chest wall tumor necrotic foul-smelling HEART: regular rate & rhythm and no murmurs and no lower extremity edema ABDOMEN:abdomen soft, non-tender and normal bowel sounds MUSCULOSKELETAL:no cyanosis of digits and no clubbing  NEURO: alert & oriented x 3 with fluent speech, peripheral neuropathy EXTREMITIES: No lower extremity edema  LABORATORY DATA:  I have reviewed the data as listed CMP Latest Ref Rng & Units 09/14/2017 09/07/2017 09/06/2017  Glucose 70 - 140 mg/dL 82 103 101(H)  BUN 7 - 26 mg/dL 21 33(H) 31(H)  Creatinine 0.60 - 1.10 mg/dL 0.79 1.13(H) 1.06(H)  Sodium 136 - 145 mmol/L 133(L) 132(L) 130(L)  Potassium 3.5 - 5.1 mmol/L 2.7(LL) 4.2 3.2(L)  Chloride 98 - 109 mmol/L 83(L) 87(L) 84(L)  CO2 22 - 29 mmol/L 37(H) 33(H) 31  Calcium 8.4 - 10.4 mg/dL 9.0 9.0 8.6(L)  Total Protein 6.4 - 8.3 g/dL 6.5 6.4 6.3(L)  Total Bilirubin 0.2 - 1.2 mg/dL 1.0 0.8 1.4(H)  Alkaline Phos 40 - 150 U/L 304(H) 101 80  AST 5 - 34 U/L 87(H) 49(H) 45(H)  ALT 0 - 55 U/L _0 Lab Results  Component Value Date   WBC 14.0 (H) 09/14/2017   HGB 8.7 (L) 09/14/2017   HCT 26.9 (L) 09/14/2017   MCV 79.5 09/14/2017   PLT 335 09/14/2017   NEUTROABS 12.2 (H) 09/14/2017    ASSESSMENT & PLAN:  Bilateral breast cancer ([facility]) Bilateral breast cancers are usually diagnosed 2015 treated with surgery, radiation, anastrozole Metastatic breast cancer diagnosed 05/28/2015 as subpectoral mass, ER 5%, PR 0%, HER-2 negative, failed Xeloda and carboplatin and gemcitabine  Current Treatment: Olaparib Toxicities:  Diarrhea: Instructed to take Imodium  severe fatigue: Probably due to anemia  Severe Anemia:Transfusions as needed.   She does not need blood transfusion today.. Pain Management: Morphine + Duragesic patch. Necrotic foul-smelling tumor in the left chest wall and scapula Patient should ideally be in hospice but she is reluctant to accept it.  She wishes to receive blood transfusions to keep her comfortable. She also wishes to continue with olaparib for the time being. I have concerns about her compliance with olaparib.  Return to clinic in 1 week for blood work and follow-up.  I spent 25 minutes talking to the patient of which more than half was spent in counseling and coordination of care.  No orders of the defined types were placed in this encounter.  The patient has a good understanding of the overall plan. she agrees with it. she will call with any problems that may develop before the next visit here.   Harriette Ohara, MD 09/21/17

## 2017-09-21 NOTE — Telephone Encounter (Signed)
[facility] [facility] CSW Progress Notes  Sales executive to get update on status of request for [employer].  Rn was able to complete in home assessment on 2/20.  Per Janeece Riggers, patient does not meet criteria for their services and was not awarded any PCS hours.  CSW also called Antony Madura, [facility] [employer] engineer for update.  Per email contact, [facility] has been unable to make second home visit as patient was not able to be contacted.  Edwyna Shell, LCSW Clinical Social Worker Phone:  [phone]

## 2017-09-22 ENCOUNTER — Inpatient Hospital Stay: Payer: Medicare Other

## 2017-09-22 VITALS — BP 83/57 | HR 83 | Temp 98.1°F | Resp 18

## 2017-09-22 DIAGNOSIS — C50011 Malignant neoplasm of nipple and areola, right female breast: Secondary | ICD-10-CM

## 2017-09-22 DIAGNOSIS — C50012 Malignant neoplasm of nipple and areola, left female breast: Principal | ICD-10-CM

## 2017-09-22 DIAGNOSIS — C7951 Secondary malignant neoplasm of bone: Secondary | ICD-10-CM

## 2017-09-22 DIAGNOSIS — C770 Secondary and unspecified malignant neoplasm of lymph nodes of head, face and neck: Secondary | ICD-10-CM

## 2017-09-22 LAB — TYPE AND SCREEN
ABO/RH(D): AB POS
Antibody Screen: NEGATIVE

## 2017-09-22 LAB — PREPARE RBC (CROSSMATCH)

## 2017-09-22 MED ORDER — DIPHENHYDRAMINE HCL 25 MG PO CAPS
ORAL_CAPSULE | ORAL | Status: AC
  Filled 2017-09-22: qty 1

## 2017-09-22 MED ORDER — SODIUM CHLORIDE 0.9% FLUSH
10.0000 mL | INTRAVENOUS | Status: DC | PRN
  Administered 2017-09-22: 10 mL via INTRAVENOUS
  Filled 2017-09-22: qty 10

## 2017-09-22 MED ORDER — SODIUM CHLORIDE 0.9 % IV SOLN
250.0000 mL | Freq: Once | INTRAVENOUS | Status: AC
  Administered 2017-09-22: 250 mL via INTRAVENOUS

## 2017-09-22 MED ORDER — ACETAMINOPHEN 325 MG PO TABS
650.0000 mg | ORAL_TABLET | Freq: Once | ORAL | Status: AC
  Administered 2017-09-22: 650 mg via ORAL

## 2017-09-22 MED ORDER — DIPHENHYDRAMINE HCL 25 MG PO CAPS
25.0000 mg | ORAL_CAPSULE | Freq: Once | ORAL | Status: AC
  Administered 2017-09-22: 25 mg via ORAL

## 2017-09-22 MED ORDER — HEPARIN SOD (PORK) LOCK FLUSH 100 UNIT/ML IV SOLN
500.0000 [IU] | Freq: Once | INTRAVENOUS | Status: AC | PRN
  Administered 2017-09-22: 500 [IU] via INTRAVENOUS
  Filled 2017-09-22: qty 5

## 2017-09-22 MED ORDER — ACETAMINOPHEN 325 MG PO TABS
ORAL_TABLET | ORAL | Status: AC
  Filled 2017-09-22: qty 2

## 2017-09-22 NOTE — Patient Instructions (Signed)

## 2017-09-24 LAB — TYPE AND SCREEN
ABO/RH(D): AB POS
Antibody Screen: NEGATIVE
DONOR AG TYPE: NEGATIVE
Donor AG Type: NEGATIVE
UNIT DIVISION: 0
Unit division: 0

## 2017-09-24 LAB — BPAM RBC
BLOOD PRODUCT EXPIRATION DATE: 201903182359
Blood Product Expiration Date: 201903142359
ISSUE DATE / TIME: 201902231009
ISSUE DATE / TIME: 201902231009
UNIT TYPE AND RH: 5100
Unit Type and Rh: 5100

## 2017-09-26 ENCOUNTER — Other Ambulatory Visit: Payer: Self-pay

## 2017-09-26 ENCOUNTER — Telehealth: Payer: Self-pay

## 2017-09-26 MED ORDER — LYNPARZA 100 MG PO TABS: 100.0000 mg | ORAL_TABLET | Freq: Every day | ORAL | 0 refills | Status: AC

## 2017-09-26 NOTE — Telephone Encounter (Signed)
Returned pt call regarding Paula Navarro. Pt stated she is out of pills. Informed her [facility] has been trying to get in touch with her. New script was sent and informed her she would need to contact them.  Cyndia Bent RN

## 2017-09-27 MED FILL — LYNPARZA 100 MG TABS: 100 | 30 days supply | Qty: 30 | Fill #0

## 2017-09-28 ENCOUNTER — Telehealth: Payer: Self-pay | Admitting: Pharmacy Technician

## 2017-09-28 ENCOUNTER — Ambulatory Visit (HOSPITAL_COMMUNITY)
Admission: RE | Admit: 2017-09-28 | Discharge: 2017-09-28 | Disposition: A | Discharge status: Home / Self Care | Payer: Medicare Other | Source: Ambulatory Visit | Attending: Hematology and Oncology | Admitting: Hematology and Oncology

## 2017-09-28 ENCOUNTER — Inpatient Hospital Stay: Payer: Medicare Other

## 2017-09-28 ENCOUNTER — Telehealth: Payer: Self-pay

## 2017-09-28 ENCOUNTER — Inpatient Hospital Stay: Payer: Medicare Other | Attending: Hematology and Oncology | Admitting: Adult Health

## 2017-09-28 ENCOUNTER — Encounter: Payer: Self-pay | Admitting: Adult Health

## 2017-09-28 VITALS — BP 98/74 | HR 106 | Temp 98.3°F | Resp 18 | Ht 62.0 in | Wt 102.4 lb

## 2017-09-28 DIAGNOSIS — E860 Dehydration: Secondary | ICD-10-CM

## 2017-09-28 DIAGNOSIS — Z452 Encounter for adjustment and management of vascular access device: Secondary | ICD-10-CM | POA: Insufficient documentation

## 2017-09-28 DIAGNOSIS — C50011 Malignant neoplasm of nipple and areola, right female breast: Secondary | ICD-10-CM

## 2017-09-28 DIAGNOSIS — Z90710 Acquired absence of both cervix and uterus: Secondary | ICD-10-CM | POA: Diagnosis not present

## 2017-09-28 DIAGNOSIS — Z923 Personal history of irradiation: Secondary | ICD-10-CM | POA: Diagnosis not present

## 2017-09-28 DIAGNOSIS — Z90722 Acquired absence of ovaries, bilateral: Secondary | ICD-10-CM | POA: Diagnosis not present

## 2017-09-28 DIAGNOSIS — C541 Malignant neoplasm of endometrium: Secondary | ICD-10-CM | POA: Insufficient documentation

## 2017-09-28 DIAGNOSIS — R197 Diarrhea, unspecified: Secondary | ICD-10-CM | POA: Diagnosis not present

## 2017-09-28 DIAGNOSIS — R634 Abnormal weight loss: Secondary | ICD-10-CM | POA: Diagnosis not present

## 2017-09-28 DIAGNOSIS — Z803 Family history of malignant neoplasm of breast: Secondary | ICD-10-CM | POA: Insufficient documentation

## 2017-09-28 DIAGNOSIS — C50012 Malignant neoplasm of nipple and areola, left female breast: Secondary | ICD-10-CM | POA: Diagnosis not present

## 2017-09-28 DIAGNOSIS — R222 Localized swelling, mass and lump, trunk: Secondary | ICD-10-CM | POA: Insufficient documentation

## 2017-09-28 DIAGNOSIS — Z170 Estrogen receptor positive status [ER+]: Secondary | ICD-10-CM | POA: Insufficient documentation

## 2017-09-28 DIAGNOSIS — Z79811 Long term (current) use of aromatase inhibitors: Secondary | ICD-10-CM | POA: Insufficient documentation

## 2017-09-28 DIAGNOSIS — J449 Chronic obstructive pulmonary disease, unspecified: Secondary | ICD-10-CM | POA: Diagnosis not present

## 2017-09-28 DIAGNOSIS — R531 Weakness: Secondary | ICD-10-CM | POA: Diagnosis not present

## 2017-09-28 DIAGNOSIS — Z87891 Personal history of nicotine dependence: Secondary | ICD-10-CM

## 2017-09-28 DIAGNOSIS — C50919 Malignant neoplasm of unspecified site of unspecified female breast: Secondary | ICD-10-CM

## 2017-09-28 DIAGNOSIS — Z79899 Other long term (current) drug therapy: Secondary | ICD-10-CM | POA: Insufficient documentation

## 2017-09-28 DIAGNOSIS — B370 Candidal stomatitis: Secondary | ICD-10-CM

## 2017-09-28 DIAGNOSIS — R5383 Other fatigue: Secondary | ICD-10-CM | POA: Insufficient documentation

## 2017-09-28 DIAGNOSIS — C7801 Secondary malignant neoplasm of right lung: Secondary | ICD-10-CM | POA: Insufficient documentation

## 2017-09-28 DIAGNOSIS — C7951 Secondary malignant neoplasm of bone: Secondary | ICD-10-CM | POA: Insufficient documentation

## 2017-09-28 DIAGNOSIS — L989 Disorder of the skin and subcutaneous tissue, unspecified: Secondary | ICD-10-CM | POA: Diagnosis not present

## 2017-09-28 DIAGNOSIS — C770 Secondary and unspecified malignant neoplasm of lymph nodes of head, face and neck: Secondary | ICD-10-CM | POA: Diagnosis not present

## 2017-09-28 DIAGNOSIS — R51 Headache: Secondary | ICD-10-CM

## 2017-09-28 DIAGNOSIS — R410 Disorientation, unspecified: Secondary | ICD-10-CM | POA: Insufficient documentation

## 2017-09-28 DIAGNOSIS — R519 Headache, unspecified: Secondary | ICD-10-CM

## 2017-09-28 DIAGNOSIS — D649 Anemia, unspecified: Secondary | ICD-10-CM | POA: Diagnosis not present

## 2017-09-28 LAB — COMPREHENSIVE METABOLIC PANEL
ALBUMIN: 2.1 g/dL — AB (ref 3.5–5.0)
ALK PHOS: 110 U/L (ref 40–150)
ALT: 13 U/L (ref 0–55)
ANION GAP: 12 — AB (ref 3–11)
AST: 33 U/L (ref 5–34)
BILIRUBIN TOTAL: 0.6 mg/dL (ref 0.2–1.2)
BUN: 22 mg/dL (ref 7–26)
CALCIUM: 10 mg/dL (ref 8.4–10.4)
CO2: 28 mmol/L (ref 22–29)
Chloride: 93 mmol/L — ABNORMAL LOW (ref 98–109)
Creatinine, Ser: 0.83 mg/dL (ref 0.60–1.10)
GFR calc non Af Amer: >60 mL/min (ref >60)
Glucose, Bld: 96 mg/dL (ref 70–140)
POTASSIUM: 3.8 mmol/L (ref 3.5–5.1)
SODIUM: 133 mmol/L — AB (ref 136–145)
TOTAL PROTEIN: 6.5 g/dL (ref 6.4–8.3)

## 2017-09-28 LAB — CBC WITH DIFFERENTIAL/PLATELET
BASOS PCT: 0 %
Basophils Absolute: 0 10*3/uL (ref 0.0–0.1)
EOS ABS: 0.1 10*3/uL (ref 0.0–0.5)
Eosinophils Relative: 0 %
HEMATOCRIT: 30.5 % — AB (ref 34.8–46.6)
Hemoglobin: 9.9 g/dL — ABNORMAL LOW (ref 11.6–15.9)
LYMPHS ABS: 1.3 10*3/uL (ref 0.9–3.3)
Lymphocytes Relative: 11 %
MCH: 26.5 pg (ref 25.1–34.0)
MCHC: 32.5 g/dL (ref 31.5–36.0)
MCV: 81.6 fL (ref 79.5–101.0)
MONO ABS: 0.7 10*3/uL (ref 0.1–0.9)
MONOS PCT: 6 %
Neutro Abs: 9.8 10*3/uL — ABNORMAL HIGH (ref 1.5–6.5)
Neutrophils Relative %: 83 %
Platelets: 291 10*3/uL (ref 145–400)
RBC: 3.74 MIL/uL (ref 3.70–5.45)
RDW: 17.9 % — AB (ref 11.2–14.5)
WBC: 11.8 10*3/uL — ABNORMAL HIGH (ref 3.9–10.3)

## 2017-09-28 LAB — SAMPLE TO BLOOD BANK

## 2017-09-28 MED ORDER — SODIUM CHLORIDE 0.9 % IV SOLN: 1000.0000 mL | Freq: Once | INTRAVENOUS | Status: DC

## 2017-09-28 MED ORDER — SODIUM CHLORIDE 0.9% FLUSH
10.0000 mL | INTRAVENOUS | Status: DC | PRN
  Administered 2017-09-28: 10 mL via INTRAVENOUS
  Filled 2017-09-28: qty 10

## 2017-09-28 MED ORDER — HEPARIN SOD (PORK) LOCK FLUSH 100 UNIT/ML IV SOLN
500.0000 [IU] | Freq: Once | INTRAVENOUS | Status: AC | PRN
  Administered 2017-09-28: 500 [IU] via INTRAVENOUS
  Filled 2017-09-28: qty 5

## 2017-09-28 NOTE — Telephone Encounter (Signed)
Oral Oncology Patient Advocate Encounter  Confirmed with [facility] that the patient's prescription for [name] was picked up on 09-27-2017.

## 2017-09-28 NOTE — Progress Notes (Signed)
Paula Navarro Follow up:    Paula Navarro, [place] [address]   DIAGNOSIS: Navarro Staging Bilateral breast Navarro [facility]) Staging form: Breast, AJCC 7th Edition - Clinical: Stage IIB (T2, N1, cM0) - Unsigned Staging comments: Staged at breast conference 08/13/13  - Pathologic: Stage IV (M1) - Unsigned  Endometrial Navarro ([place]) Staging form: Corpus Uteri - Carcinoma and Carcinosarcoma, AJCC 8th Edition - Pathologic stage from 02/13/2017: FIGO Stage IIIA, calculated as Stage Unknown (pT3a, pNX, cM0) - Signed by Everitt Amber, MD on 03/14/2017 - Clinical: No stage assigned - Unsigned   SUMMARY OF ONCOLOGIC HISTORY:   Bilateral breast Navarro ([place])   07/23/2013 Mammogram    Bilateral breast masses. 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 N1 M0 stage IIB ER 100%, PR 0%, HER-2       06/17/2014 -  Radiation Therapy    Adjuvant radiation therapy      09/14/2014 -  Anti-estrogen oral  therapy    Anastrozole 1 mg daily      05/28/2015 Imaging    CT scans: Enlarging subpectoral masses 3.1 x 3.5 cm, posteriorly lower density mass 4.5 x 2.1 cm, several right-sided lung nodules right lower lobe 1.4 cm, 3 other right lung nodules, 1.7 cm right iliac bone lesion      06/21/2015 Procedure    Left subpectoral mass biopsy: Invasive high-grade ductal carcinoma ER 5%, PR 0%, HER-2 negative ratio 1.29      09/01/2015 Imaging    Left chest wall mass increased in size 7.2 x 5.1 cm, multiple subcutaneous nodules, increase in the lung nodules both in number as well as in the size of existing nodules      09/10/2015 -  Chemotherapy    Carboplatin, gemcitabine days 1 and 8 q 3 weeks, carboplatin discontinued for neuropathy (treatment break from 01/20/2016 to 03/24/2016); Added Carboplatin back 04/14/16 (for progression)      01/26/2016 Imaging    Marked improvement in size of lung nodules with many of the nodules resolved index right lower lobe nodule 1.9 x 1.8 cm is now 0.7 x 0.6 cm, reduction in the size of left breast mass 7.2 cm down to 2.6 cm, enlargement in the hypodense mass in the uterus      04/06/2016 Imaging    CT chest: Interval progression of pre-existing lung nodules new lung metastases (  23m, 175m 2.9 cm, 1.6 cm), interval progression of disease in the left breast 4.2 cm (was 2.6 cm), additional nodules 2.3 cm and 3.2 cm; CT head: scalp mass 1.9 cm      06/27/2016 Imaging    Ct chest: Stable lesions in lt breast deep to Left pectoralis, cystic lesion loc ant in left breast inc compared to previous; decrease in lung nodules (30m49mo 3 mm; 12 mm to 7 mm, RML nodule 2.9 cm to 1.8 cm, RLL 10m58m 7 mm)      11/14/2016 Imaging    CT CAP:RP and retrocrural lymphadenopathy, large uterine fibroids with necrosis; MRI Abd: RP lymphadenopathy CT chest 11/20/16: Worsening bulky conglomeration of mediastinal and right hilar lymph nodes, lung nodules subcentimeter size slight increase, left  breast mass was 3.7 x 5.8 cm now 5.4 x 7.4 cm      12/08/2016 - 04/06/2017 Chemotherapy    Palliative chemotherapy with [place] days 1 and 8 every 3 weeks stopped due to peripheral neuropathy       04/30/2017 - 05/18/2017 Radiation Therapy    Palliative radiation therapy to the scalp and the left chest wall lesion      06/11/2017 Imaging    Progression noted with new and progressing pulmonary nodules, increasing size of periaortic mass, increasing iliac adenopathy with mass lesion difficult to separate from adjacent psoas muscle, new 15mm67mule in the LUQ anterior to the colon.        06/25/2017 Miscellaneous    Olaparib (patient is BRCA mutation positive)       Endometrial Navarro (HCC) [place]/17/2018 Surgery    Hysterectomy with BSO: HG Serous carcinoma 7.7 cm (>50% Myometrial inv)involving Uterus and Bilateral ovaries, Omentum: No carcinoma; T3a (FIGO stage IIIa)       CURRENT THERAPY: Olaparib  INTERVAL HISTORY: [name] female returns for evaluation of her metastatic breast Navarro.  [name] me that her family has noted that she has been more confused over the past two days.  She notes that she may have gotten her medication mixed up.  She isn't sure.  She says she is taking the olaparib daily.  She notes that she is in pain and using her medication as prescribed.     Patient Active Problem List   Diagnosis Date Noted  . Anemia 08/03/2017  . DNR (do not resuscitate) 08/03/2017  . Rectal bleed 07/21/2017  . SIRS (systemic inflammatory response syndrome) (HCC) [place]21/2018  . Hypomagnesemia 07/20/2017  . Hyponatremia 07/20/2017  . Symptomatic anemia 07/20/2017  . Acute post-operative pain 05/10/2017  . Endometrial Navarro (HCC) [place]21/2018  . Protein-calorie malnutrition, severe 11/15/2016  . UTI (urinary tract infection) 11/14/2016  . 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  Chemotherapy-induced thrombocytopenia 06/13/2016  . Scalp lesion 04/01/2016  . Encounter for central line care 12/08/2015  . Port catheter in place 11/19/2015  . Insomnia 11/01/2015  . Microcytic anemia 09/28/2015  . Lung metastases (HCC) [place]10/2017  . Bone metastases (HCC) [place]20/2016  . Goals of care, counseling/discussion 07/13/2015  . Metastatic breast Navarro (HCC) [place]29/2016  . Chronic pain 03/16/2015  . Vaginal bleeding 09/24/2014  . Postherpetic neuralgia 09/03/2014  . Lymphedema 09/03/2014  . Suspected herpes zoster left C5 distribution 08/14/2014  . Neuropathy due to chemotherapeutic drug (HCC) [place]04/2015  . Anxiety 03/03/2014  . Bilateral breast Navarro (HCC) [place]12/2015  . Palpitations   . Chest pain   .  Atrial septal defect   . Laboratory test 11/25/2011  . Chronic bronchitis   . Tobacco abuse     is allergic to codeine; morphine and related; and oxycodone hcl.  MEDICAL HISTORY: Past Medical History:  Diagnosis Date  . Anemia   . Anxiety   . Atrial septal defect 1996   Surgical repair in 1996  . Breast Navarro ([place]) dx'd 06/2013  . Chest pain    Admitted to [facility] in 09/2011; refused stress test  . Chronic bronchitis   . Chronic pain   . COPD (chronic obstructive pulmonary disease) ([place])    on xray  . Endometrial Navarro ([place])   . Metastatic Navarro to lung ([place]) dx'd 2017  . Palpitation    Tachycardia reported by monitor clerk during a symptomatic spell  . Radiation 06/30/14-08/17/14   Bilateral Breast  . Tobacco abuse    60 pack years; 1.5 packs per day  . Wears dentures    top    SURGICAL HISTORY: Past Surgical History:  Procedure Laterality Date  . ASD REPAIR, OSTIUM PRIMUM  1996   dr Roxy Horseman  . AXILLARY LYMPH NODE DISSECTION Bilateral 04/08/2014   Procedure:  BILATERAL AXILLARY LYMPH NODE DISSECTION;  Surgeon: Autumn Messing III, MD;  Location: [facility];  Service: General;  Laterality: Bilateral;  . BREAST BIOPSY Bilateral   . BREAST  LUMPECTOMY WITH RADIOACTIVE SEED LOCALIZATION Bilateral 04/08/2014   Procedure: BILATERAL  RADIOACTIVE SEED LOCALIZATION LUMPECTOMY ;  Surgeon: Autumn Messing III, MD;  Location: [facility];  Service: General;  Laterality: Bilateral;  . CESAREAN SECTION     x3  . CHOLECYSTECTOMY    . LAPAROTOMY N/A 02/13/2017   Procedure: MINI EXPLORATORY LAPAROTOMY;  Surgeon: Everitt Amber, MD;  Location: [facility];  Service: Gynecology;  Laterality: N/A;  . open heart surgery    . PORT A CATH REVISION  1/15   put in   . ROBOTIC ASSISTED TOTAL HYSTERECTOMY WITH BILATERAL SALPINGO OOPHERECTOMY Bilateral 02/13/2017   Procedure: XI ROBOTIC ASSISTED TOTAL HYSTERECTOMY WITH BILATERAL SALPINGO OOPHORECTOMY WITH LYSIS OF ADHESIONS, UTERUS GREATER THAN 250GRAMS;  Surgeon: Everitt Amber, MD;  Location: [facility];  Service: Gynecology;  Laterality: Bilateral;  . TUBAL LIGATION      SOCIAL HISTORY: Social History   Socioeconomic History  . Marital status: Widowed    Spouse name: Not on file  . Number of children: Not on file  . Years of education: Not on file  . Highest education level: Not on file  Social Needs  . Financial resource strain: Not on file  . Food insecurity - worry: Not on file  . Food insecurity - inability: Not on file  . Transportation needs - medical: Not on file  . Transportation needs - non-medical: Not on file  Occupational History  . Not on file  Tobacco Use  . Smoking status: Former Smoker    Packs/day: 1.50    Years: 40.00    Pack years: 60.00    Types: Cigarettes    Last attempt to quit: 08/01/2012    Years since quitting: 5.1  . Smokeless tobacco: Never Used  Substance and Sexual Activity  . Alcohol use: No  . Drug use: No  . Sexual activity: Not Currently  Other Topics Concern  . Not on file  Social History Narrative  . Not on file    FAMILY HISTORY: Family History  Problem Relation Age of Onset  . Breast Navarro Maternal Aunt     Review  of Systems  Constitutional:  Positive for appetite change and fatigue. Negative for chills, fever and unexpected weight change.  HENT:   Positive for mouth sores. Negative for hearing loss and lump/mass.   Eyes: Negative for eye problems and icterus.  Respiratory: Negative for chest tightness, cough and shortness of breath.   Cardiovascular: Negative for chest pain, leg swelling and palpitations.  Gastrointestinal: Negative for abdominal distention and abdominal pain.      PHYSICAL EXAMINATION  ECOG PERFORMANCE STATUS: 2 - Symptomatic, <50% confined to bed  Vitals:   09/28/17 1430  BP: 98/74  Pulse: (!) 106  Resp: 18  Temp: 98.3 F (36.8 C)    Physical Exam  Constitutional: She is well-developed, well-nourished, and in no distress.  HENT:  Head: Normocephalic and atraumatic.  Mouth/Throat: Oropharynx is clear and moist. No oropharyngeal exudate.  Eyes: Pupils are equal, round, and reactive to light. No scleral icterus.  Neck: Neck supple.  Cardiovascular: Normal rate, regular rhythm and normal heart sounds.  Pulmonary/Chest: Effort normal and breath sounds normal.  Fungating breast lesion dressed with guaze, patient states dressing due to be changed today and she will change it later.  Abdominal: Soft. Bowel sounds are normal. She exhibits no distension and no mass. There is no tenderness. There is no rebound and no guarding.  Lymphadenopathy:    She has no cervical adenopathy.  Neurological: She is alert.  Patient conversation is inappropriate given the situation.  She called myself and "yall other girls" fat.  She is oriented to the year.  She does not know the president.  She tells me that she is at the doctors office but she doesn't know where.  She interrupted our conversation to talk about praise songs at church and shouting for the Day.  Skin: Skin is warm and dry.  Scalp lesion is dressed, dressing is clean dry and intact.  Marlon Pel, RN offered to change dressings today, patient declined.       LABORATORY DATA:  CBC    Component Value Date/Time   WBC 11.8 (H) 09/28/2017 1340   RBC 3.74 09/28/2017 1340   HGB 9.9 (L) 09/28/2017 1340   HGB 6.7 (LL) 07/19/2017 1051   HCT 30.5 (L) 09/28/2017 1340   HCT 21.0 (L) 07/19/2017 1051   PLT 291 09/28/2017 1340   PLT 444 (H) 07/19/2017 1051   MCV 81.6 09/28/2017 1340   MCV 73.0 (L) 07/19/2017 1051   MCH 26.5 09/28/2017 1340   MCHC 32.5 09/28/2017 1340   RDW 17.9 (H) 09/28/2017 1340   RDW 24.6 (H) 07/19/2017 1051   LYMPHSABS 1.3 09/28/2017 1340   LYMPHSABS 1.1 07/19/2017 1051   MONOABS 0.7 09/28/2017 1340   MONOABS 0.7 07/19/2017 1051   EOSABS 0.1 09/28/2017 1340   EOSABS 0.0 07/19/2017 1051   BASOSABS 0.0 09/28/2017 1340   BASOSABS 0.0 07/19/2017 1051    CMP     Component Value Date/Time   NA 133 (L) 09/28/2017 1340   NA 134 (L) 07/19/2017 1051   K 3.8 09/28/2017 1340   K 3.9 07/19/2017 1051   CL 93 (L) 09/28/2017 1340   CO2 28 09/28/2017 1340   CO2 27 07/19/2017 1051   GLUCOSE 96 09/28/2017 1340   GLUCOSE 104 07/19/2017 1051   BUN 22 09/28/2017 1340   BUN 14.0 07/19/2017 1051   CREATININE 0.83 09/28/2017 1340   CREATININE 0.8 07/19/2017 1051   CALCIUM 10.0 09/28/2017 1340   CALCIUM 9.6 07/19/2017 1051   PROT 6.5 09/28/2017  1340   PROT 7.0 07/19/2017 1051   ALBUMIN 2.1 (L) 09/28/2017 1340   ALBUMIN 2.5 (L) 07/19/2017 1051   AST 33 09/28/2017 1340   AST 30 07/19/2017 1051   ALT 13 09/28/2017 1340   ALT 12 07/19/2017 1051   ALKPHOS 110 09/28/2017 1340   ALKPHOS 64 07/19/2017 1051   BILITOT 0.6 09/28/2017 1340   BILITOT 0.42 07/19/2017 1051   GFRNONAA >60 09/28/2017 1340   GFRAA >60 09/28/2017 1340      ASSESSMENT and  PLAN:   Bilateral breast Navarro ([place]) Bilateral breast cancers are usually diagnosed 2015 treated with surgery, radiation, anastrozole Metastatic breast Navarro diagnosed 05/28/2015 as subpectoral mass, ER 5%, PR 0%, HER-2 negative, failed Xeloda and carboplatin and  gemcitabine  Current Treatment: Olaparib Toxicities:  Diarrhea: Instructed to take Imodium  severe fatigue: Probably due to anemia  Severe Anemia:Transfusions as needed.  She does not need blood transfusion today.. Pain Management: Morphine + Duragesic patch.  Vincenzina's situation is quite difficult.  I attempted to get in touch with her son twice during today's appointment as he is the emergency contact, and I could not get him on the phone.  I attempted to get the patient's daughter's phone number from the patient, and she would not give me the phone number.  After discussion with Dr. Jana Hakim, I recommended that due to her mild confusion and poor family support, that she go to the emergency room for eval of possible brain metastases.  Pachia refused to go, telling me her brain is fine and that I don't know anything and that she is going home.  I contacted Lelon Frohlich in social work and she recommended calling APS.  Patient will return tomorrow for IV fluids.  She has another appointment with me next week.  I will work to get that moved to Dr. Geralyn Flash schedule for Thursday as he is out of town on Friday.      Orders Placed This Encounter  Procedures  . MR Brain W Wo Contrast    Standing Status:   Future    Standing Expiration Date:   09/28/2018    Order Specific Question:   If indicated for the ordered procedure, I authorize the administration of contrast media per Radiology protocol    Answer:   Yes    Order Specific Question:   What is the patient's sedation requirement?    Answer:   No Sedation    Order Specific Question:   Does the patient have a pacemaker or implanted devices?    Answer:   No    Order Specific Question:   Radiology Contrast Protocol - do NOT remove file path    Answer:   [email or web address]    Order Specific Question:   Preferred imaging location?    Answer:   [facility] (table limit-350 lbs)    All questions were answered. The patient  knows to call the clinic with any problems, questions or concerns. We can certainly see the patient much sooner if necessary.  A total of (50) minutes of face-to-face time was spent with this patient with greater than 50% of that time in counseling and care-coordination.  This note was electronically signed. Scot Dock, NP 09/28/2017

## 2017-09-28 NOTE — Telephone Encounter (Signed)
Received VM from pt's daughter regarding someone tried to contact the pt's son earlier.  Returned call to daughter Philis Nettle, noted her number in Epic at this time,  as we needed another number documented to call if needed. Daughter is aware of pt's condition and visit here today with Ria Comment NP. No other needs per daughter at this time.

## 2017-09-28 NOTE — Assessment & Plan Note (Addendum)
Bilateral breast cancers are usually diagnosed 2015 treated with surgery, radiation, anastrozole Metastatic breast cancer diagnosed 05/28/2015 as subpectoral mass, ER 5%, PR 0%, HER-2 negative, failed Xeloda and carboplatin and gemcitabine  Current Treatment: Olaparib Toxicities:  Diarrhea: Instructed to take Imodium  severe fatigue: Probably due to anemia  Severe Anemia:Transfusions as needed.  She does not need blood transfusion today.. Pain Management: Morphine + Duragesic patch.  Paula Navarro's situation is quite difficult.  I attempted to get in touch with her son twice during today's appointment as he is the emergency contact, and I could not get him on the phone.  I attempted to get the patient's daughter's phone number from the patient, and she would not give me the phone number.  After discussion with Dr. Jana Hakim, I recommended that due to her mild confusion and poor family support, that she go to the emergency room for eval of possible brain metastases.  Paula Navarro refused to go, telling me her brain is fine and that I don't know anything and that she is going home.  I contacted Lelon Frohlich in social work and she recommended calling APS.  Patient will return tomorrow for IV fluids.  She has another appointment with me next week.  I will work to get that moved to Dr. Geralyn Flash schedule for Thursday as he is out of town on Friday.

## 2017-09-28 NOTE — Telephone Encounter (Signed)
Received call from lab with critical hemoglobin of 9.9 today (last result was 7.4 on 09/21/2017.) Notified [name] NP.  Pt has appointment with her today.

## 2017-09-29 ENCOUNTER — Telehealth: Payer: Self-pay | Admitting: *Deleted

## 2017-09-29 NOTE — Telephone Encounter (Signed)
Scheduled today at 8:00 am, possibly for IVF per 09-28-2017 office note.  No arrival at this time.  HGB = 9.9 on 09-27-2017.  Called mobile [phone]) and home [phone]).  Mobile, no answer, unable to leave message per automatic alert "mail box full".  Incorrect home number in EMR.  Confirmed number dialed with female answering call from this nurse.  Apologized for inconvenience.  Demographic information corrected.

## 2017-10-01 ENCOUNTER — Telehealth: Payer: Self-pay

## 2017-10-01 ENCOUNTER — Telehealth: Payer: Self-pay | Admitting: Hematology and Oncology

## 2017-10-01 ENCOUNTER — Inpatient Hospital Stay: Payer: Medicare Other

## 2017-10-01 NOTE — Telephone Encounter (Signed)
FYI

## 2017-10-01 NOTE — Telephone Encounter (Signed)
Attempted to return patients daughters call, unable to leave VM. No answer.

## 2017-10-01 NOTE — Telephone Encounter (Signed)
We called Paula Navarro concerning her not making it to her appointment on Saturday for fluids. Her port was left accessed on Friday for this Saturday appointment. I explained to her it was very important for her to have this de accessed. I have called and left a message for the [name] at [facility] to see if we are able get her de accessed there. I have not heard back from them. Keishana states no one can bring her to our cancer center today. At this point if she is unable to make it here today she may need to go to the ED to have it removed. Explained to patient it is not safe for her to remove herself due to risk of infection. She has understanding. I informed her as soon as I here back from the [facility] I will let her know.  Cyndia Bent RN

## 2017-10-01 NOTE — Telephone Encounter (Signed)
Per 3/1 los change MD appointment to 3/7 [facility] said to wait for order to move everything else

## 2017-10-02 ENCOUNTER — Encounter: Payer: Self-pay | Admitting: General Practice

## 2017-10-02 ENCOUNTER — Other Ambulatory Visit: Payer: Medicare Other

## 2017-10-02 NOTE — Progress Notes (Signed)
[place] CSW Progress Note  CSW spoke w [place] re patient's Medicaid.  Patient was due for annual recertification for benefits - has been mailed two letters requesting required paperwork for this recertification process.  Patient has not responded to either request for required documents.  Her Medicaid case was closed on 09/27/17 due to failure to produce documentation needed to maintain Medicaid eligibility.  DSS worker is going to reach out to her Medicaid worker Leveda Anna Brand) and APS worker Antony Madura) to determine next steps and how best to work w patient to get benefits reinstated.  Until that time, patient will not have access to [facility] transport.  Patient can schedule alternative transport for non Medicaid covered individuals; however this service ([facility]) will only transport out of county for those covered by Medicaid.  CSW will continue to monitor and attempt to assist w this process.  CSW attempted to contact patient, no answer, unable to leave VM as mailbox was full.    Edwyna Shell, LCSW Clinical Social Worker Phone:  [phone]

## 2017-10-03 ENCOUNTER — Encounter: Payer: Self-pay | Admitting: General Practice

## 2017-10-03 NOTE — Progress Notes (Signed)
[place] CSW Progress Notes  CSW spoke w [facility] transportation - they have attempted to reach patient and have been unsuccessful.  Confirm that her Medicaid is now active.  They have her on their schedule for transport tomorrow and for all subsequent appts in March.  Transport will be going to her house tomorrow to attempt to pick her up for appt at [facility].  Edwyna Shell, LCSW Clinical Social Worker Phone:  [phone]

## 2017-10-03 NOTE — Progress Notes (Signed)
[place] CSW Progress Note  Spoke w [employer] - documents needed for recertification have been provided, patient should have active Medicaid at this time and can use Medicaid transport.  For issues/problems, patient has Medicaid worker Jeral Fruit Brand [id number]).  CSW tried to reach patient to notify her of the above.  [facility] worker, Antony Madura, has also tried to reach patient.  Both attempts unsuccessful.  Desk nurse notified that patient should now have access to transport for upcoming appointments.  Edwyna Shell, LCSW Clinical Social Worker Phone:  [phone]

## 2017-10-04 ENCOUNTER — Telehealth: Payer: Self-pay

## 2017-10-04 ENCOUNTER — Inpatient Hospital Stay (HOSPITAL_BASED_OUTPATIENT_CLINIC_OR_DEPARTMENT_OTHER): Payer: Medicare Other | Admitting: Hematology and Oncology

## 2017-10-04 ENCOUNTER — Inpatient Hospital Stay: Payer: Medicare Other

## 2017-10-04 VITALS — BP 96/73 | HR 111 | Resp 17 | Ht 62.0 in | Wt 99.4 lb

## 2017-10-04 DIAGNOSIS — C50011 Malignant neoplasm of nipple and areola, right female breast: Secondary | ICD-10-CM | POA: Diagnosis not present

## 2017-10-04 DIAGNOSIS — Z170 Estrogen receptor positive status [ER+]: Secondary | ICD-10-CM | POA: Diagnosis not present

## 2017-10-04 DIAGNOSIS — D649 Anemia, unspecified: Secondary | ICD-10-CM

## 2017-10-04 DIAGNOSIS — C770 Secondary and unspecified malignant neoplasm of lymph nodes of head, face and neck: Secondary | ICD-10-CM | POA: Diagnosis not present

## 2017-10-04 DIAGNOSIS — C541 Malignant neoplasm of endometrium: Secondary | ICD-10-CM

## 2017-10-04 DIAGNOSIS — C50012 Malignant neoplasm of nipple and areola, left female breast: Secondary | ICD-10-CM

## 2017-10-04 DIAGNOSIS — L989 Disorder of the skin and subcutaneous tissue, unspecified: Secondary | ICD-10-CM

## 2017-10-04 DIAGNOSIS — Z923 Personal history of irradiation: Secondary | ICD-10-CM

## 2017-10-04 DIAGNOSIS — R634 Abnormal weight loss: Secondary | ICD-10-CM

## 2017-10-04 DIAGNOSIS — C50919 Malignant neoplasm of unspecified site of unspecified female breast: Secondary | ICD-10-CM

## 2017-10-04 DIAGNOSIS — R222 Localized swelling, mass and lump, trunk: Secondary | ICD-10-CM | POA: Diagnosis not present

## 2017-10-04 DIAGNOSIS — R5383 Other fatigue: Secondary | ICD-10-CM

## 2017-10-04 DIAGNOSIS — C7951 Secondary malignant neoplasm of bone: Secondary | ICD-10-CM

## 2017-10-04 DIAGNOSIS — Z79811 Long term (current) use of aromatase inhibitors: Secondary | ICD-10-CM

## 2017-10-04 DIAGNOSIS — C7801 Secondary malignant neoplasm of right lung: Secondary | ICD-10-CM

## 2017-10-04 DIAGNOSIS — R531 Weakness: Secondary | ICD-10-CM

## 2017-10-04 DIAGNOSIS — Z79899 Other long term (current) drug therapy: Secondary | ICD-10-CM

## 2017-10-04 LAB — COMPREHENSIVE METABOLIC PANEL
ALBUMIN: 1.9 g/dL — AB (ref 3.5–5.0)
ALK PHOS: 143 U/L (ref 40–150)
ALT: 16 U/L (ref 0–55)
ANION GAP: 13 — AB (ref 3–11)
AST: 47 U/L — ABNORMAL HIGH (ref 5–34)
BUN: 32 mg/dL — ABNORMAL HIGH (ref 7–26)
CHLORIDE: 95 mmol/L — AB (ref 98–109)
CO2: 28 mmol/L (ref 22–29)
Calcium: 9.9 mg/dL (ref 8.4–10.4)
Creatinine, Ser: 1.32 mg/dL — ABNORMAL HIGH (ref 0.60–1.10)
GFR calc Af Amer: 48 mL/min — ABNORMAL LOW (ref >60)
GFR calc non Af Amer: 42 mL/min — ABNORMAL LOW (ref >60)
GLUCOSE: 105 mg/dL (ref 70–140)
Potassium: 3.8 mmol/L (ref 3.5–5.1)
SODIUM: 136 mmol/L (ref 136–145)
Total Bilirubin: 0.6 mg/dL (ref 0.2–1.2)
Total Protein: 6.2 g/dL — ABNORMAL LOW (ref 6.4–8.3)

## 2017-10-04 LAB — CBC WITH DIFFERENTIAL/PLATELET
BASOS ABS: 0 10*3/uL (ref 0.0–0.1)
Basophils Relative: 0 %
Eosinophils Absolute: 0 10*3/uL (ref 0.0–0.5)
Eosinophils Relative: 0 %
HCT: 29.9 % — ABNORMAL LOW (ref 34.8–46.6)
Hemoglobin: 9.7 g/dL — ABNORMAL LOW (ref 11.6–15.9)
LYMPHS ABS: 1.3 10*3/uL (ref 0.9–3.3)
LYMPHS PCT: 9 %
MCH: 26.3 pg (ref 25.1–34.0)
MCHC: 32.3 g/dL (ref 31.5–36.0)
MCV: 81.3 fL (ref 79.5–101.0)
MONO ABS: 0.7 10*3/uL (ref 0.1–0.9)
MONOS PCT: 5 %
NEUTROS ABS: 12.8 10*3/uL — AB (ref 1.5–6.5)
Neutrophils Relative %: 86 %
PLATELETS: 356 10*3/uL (ref 145–400)
RBC: 3.68 MIL/uL — ABNORMAL LOW (ref 3.70–5.45)
RDW: 18.2 % — ABNORMAL HIGH (ref 11.2–14.5)
WBC: 14.9 10*3/uL — ABNORMAL HIGH (ref 3.9–10.3)

## 2017-10-04 LAB — SAMPLE TO BLOOD BANK

## 2017-10-04 NOTE — Patient Instructions (Signed)

## 2017-10-04 NOTE — Assessment & Plan Note (Signed)
Bilateral breast cancers are usually diagnosed 2015 treated with surgery, radiation, anastrozole Metastatic breast cancer diagnosed 05/28/2015 as subpectoral mass, ER 5%, PR 0%, HER-2 negative, failed Xeloda and carboplatin and gemcitabine  Current Treatment: Hospice care Pain Management: Morphine + Duragesic patch.  Goals of care: I spoke to the patient's daughter extensively I also spoke to the patient extensively that she is progressively deteriorating.  The tumor is significantly more foul-smelling and is necrotic.  I do not believe there is any further benefit to bringing her in weekly for transfusion support.  I recommended that we pursue complete hospice care and not put her through any more transfusion type treatments. Patient and her daughter are both agreeable.  We will need to cancel all the further transportation appointments. We made a recent request for hospice care.

## 2017-10-04 NOTE — Progress Notes (Signed)
Patient Care Team: [name], MD as PCP - General (Internal Medicine) Rothbart, Cristopher Estimable, MD (Cardiology) [name], MD as Consulting Physician (Gastroenterology) Delice Bison, Charlestine Massed, NP as Nurse Practitioner (Hematology and Oncology) Nicholas Lose, MD as Consulting Physician (Hematology and Oncology) Kyung Rudd, MD as Consulting Physician (Radiation Oncology)  DIAGNOSIS:  Encounter Diagnoses  Name Primary?  . Bone metastases ([place]) Yes  . Malignant neoplasm metastatic to right lung ([place])   . 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 Scalp lesion   . Metastatic breast cancer ([place])     SUMMARY OF ONCOLOGIC HISTORY:   Bilateral breast cancer ([place])   07/23/2013 Mammogram    Bilateral breast masses. 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 N1 M0 stage IIB ER 100%, PR 0%, HER-2       06/17/2014 -  Radiation Therapy    Adjuvant radiation therapy        09/14/2014 -  Anti-estrogen oral therapy    Anastrozole 1 mg daily      05/28/2015 Imaging    CT scans: Enlarging subpectoral masses 3.1 x 3.5 cm, posteriorly lower density mass 4.5 x 2.1 cm, several right-sided lung nodules right lower lobe 1.4 cm, 3 other right lung nodules, 1.7 cm right iliac bone lesion      06/21/2015 Procedure    Left subpectoral mass biopsy: Invasive high-grade ductal carcinoma ER 5%, PR 0%, HER-2 negative ratio 1.29      09/01/2015 Imaging    Left chest wall mass increased in size 7.2 x 5.1 cm, multiple subcutaneous nodules, increase in the lung nodules both in number as well as in the size of existing nodules      09/10/2015 -  Chemotherapy    Carboplatin, gemcitabine days 1 and 8 q 3 weeks, carboplatin discontinued for neuropathy (treatment break from 01/20/2016 to 03/24/2016); Added Carboplatin back 04/14/16 (for progression)      01/26/2016 Imaging    Marked improvement in size of lung nodules with many of the nodules resolved index right lower lobe nodule 1.9 x 1.8 cm is now 0.7 x 0.6 cm, reduction in the size of left breast mass 7.2 cm down to 2.6 cm, enlargement in the hypodense mass in the uterus      04/06/2016 Imaging    CT chest: Interval progression of pre-existing lung nodules  new lung metastases (52m, 154m 2.9 cm, 1.6 cm), interval progression of disease in the left breast 4.2 cm (was 2.6 cm), additional nodules 2.3 cm and 3.2 cm; CT head: scalp mass 1.9 cm      06/27/2016 Imaging    Ct chest: Stable lesions in lt breast deep to Left pectoralis, cystic lesion loc ant in left breast inc compared to previous; decrease in lung nodules (26m7mo 3 mm; 12 mm to 7 mm, RML nodule 2.9 cm to 1.8 cm, RLL 5m17m 7 mm)      11/14/2016 Imaging    CT CAP:RP and retrocrural lymphadenopathy, large uterine fibroids with necrosis; MRI Abd: RP lymphadenopathy CT chest 11/20/16: Worsening bulky conglomeration of mediastinal and right hilar lymph nodes, lung nodules  subcentimeter size slight increase, left breast mass was 3.7 x 5.8 cm now 5.4 x 7.4 cm      12/08/2016 - 04/06/2017 Chemotherapy    Palliative chemotherapy with [place] days 1 and 8 every 3 weeks stopped due to peripheral neuropathy       04/30/2017 - 05/18/2017 Radiation Therapy    Palliative radiation therapy to the scalp and the left chest wall lesion      06/11/2017 Imaging    Progression noted with new and progressing pulmonary nodules, increasing size of periaortic mass, increasing iliac adenopathy with mass lesion difficult to separate from adjacent psoas muscle, new 15mm23mule in the LUQ anterior to the colon.        06/25/2017 Miscellaneous    Olaparib (patient is BRCA mutation positive)       Endometrial cancer (HCC) [place]/17/2018 Surgery    Hysterectomy with BSO: HG Serous carcinoma 7.7 cm (>50% Myometrial inv)involving Uterus and Bilateral ovaries, Omentum: No carcinoma; T3a (FIGO stage IIIa)       CHIEF COMPLIANT: Necrotic and foul-smelling tumor, progressively deteriorating performance status  INTERVAL HISTORY: Paula Navarro 66 ye75 old with above-mentioned history metastatic breast cancer who has a foul-smelling fungating tumor on the chest wall as well as the scalp.  She came in today for her weekly checkups.  She is progressively getting weaker.  The tumors are getting more foul-smelling and necrotic.  It is very difficult to do wound dressings because they tend to bleed easily.  She has lost significant weight.  REVIEW OF SYSTEMS:   Constitutional: Severe generalized fatigue and weight loss Eyes: Denies blurriness of vision Ears, nose, mouth, throat, and face: Denies mucositis or sore throat Respiratory: Shortness of breath to minimal exertion Cardiovascular: Denies palpitation, chest discomfort Gastrointestinal: Constipation Skin: Denies abnormal skin rashes Neurological: Generalized weaknesses Behavioral/Psych: He appears depressed Extremities: No lower  extremity edema All other systems were reviewed with the patient and are negative.  I have reviewed the past medical history, past surgical history, social history and family history with the patient and they are unchanged from previous note.  ALLERGIES:  is allergic to codeine; morphine and related; and oxycodone hcl.  MEDICATIONS:  Current Outpatient Medications  Medication Sig Dispense Refill  . acetaminophen (TYLENOL) 500 MG tablet Take 500 mg by mouth 3 (three) times daily as needed (BREAKTHROUGH PAIN).     . Adhesive Tape (ADHESIVE PAPER) TAPE Use for wound dressing as needed 1 each 1  . bumetanide (BUMEX) 1 MG tablet Take 1 tablet (1 mg total) by mouth 2 (two) times daily. 60 tablet 3  . Calcium Carb-Cholecalciferol (CALCIUM 500/D) 500-400 MG-UNIT CHEW Chew 1 tablet by mouth daily.    . dicMarland Kitchenclomine (BENTYL)  20 MG tablet Take 1 tablet (20 mg total) by mouth 2 (two) times daily as needed for spasms. 20 tablet 0  . docusate sodium (COLACE) 100 MG capsule Take 1 capsule (100 mg total) by mouth 2 (two) times daily. (Patient taking differently: Take 100 mg by mouth daily. ) 10 capsule 0  . fentaNYL (DURAGESIC - DOSED MCG/HR) 50 MCG/HR Place 1 patch (50 mcg total) onto the skin every 3 (three) days. 10 patch 0  . gabapentin (NEURONTIN) 300 MG capsule TAKE 2 CAPSULES (600 MG TOTAL) BY MOUTH 3 (THREE) TIMES DAILY. 180 capsule 2  . Gauze Pads & Dressings (GAUZE DRESSING) 4"X4" PADS Apply to wound as needed. 24 each 1  . Gauze Pads & Dressings (TELFA NON-ADHERENT DRESSING) 3"X8" PADS Apply directly to wound as needed. 50 each 1  . lactulose (CHRONULAC) 10 GM/15ML solution Take 45 mLs (30 g total) by mouth 2 (two) times daily as needed for moderate constipation or severe constipation. 240 mL 0  . lidocaine-prilocaine (EMLA) cream APPLY TO AFFECTED AREA ONCE 30 g 3  . LYNPARZA 100 MG tablet Take 1 tablet (100 mg total) by mouth daily. 30 tablet 0  . morphine (MSIR) 30 MG tablet Take 1 tablet (30 mg  total) by mouth every 6 (six) hours as needed for severe pain. 120 tablet 0  . nystatin (MYCOSTATIN) 100000 UNIT/ML suspension Take 5 mLs (500,000 Units total) by mouth 4 (four) times daily. 60 mL 0  . ondansetron (ZOFRAN) 4 MG tablet Take 1 tablet (4 mg total) by mouth every 6 (six) hours. 12 tablet 0  . polyethylene glycol (MIRALAX / GLYCOLAX) packet Take 17 g by mouth 2 (two) times daily. 14 each 0  . potassium chloride SA (K-DUR,KLOR-CON) 20 MEQ tablet Take 1 tablet (20 mEq total) by mouth daily. 30 tablet 3  . SODIUM CHLORIDE, EXTERNAL, 0.9 % SOLN Apply topically to wound as needed 210 mL 1  . SUPER B COMPLEX/C PO Take 1 tablet by mouth daily.    . Vitamins A & D (VITAMIN A & D) ointment Apply 1 application topically as needed for lip care. 45 g 1  . zolpidem (AMBIEN) 10 MG tablet TAKE ONE TABLET BY MOUTH AT BEDTIME AS NEEDED FOR SLEEP 30 tablet 0   No current facility-administered medications for this visit.     PHYSICAL EXAMINATION: ECOG PERFORMANCE STATUS: 2 - Symptomatic, <50% confined to bed  Vitals:   10/04/17 1133  BP: 96/73  Pulse: (!) 111  Resp: 17   Filed Weights   10/04/17 1133  Weight: 99 lb 6.4 oz (45.1 kg)    GENERAL:alert, no distress and comfortable SKIN: Large necrotic tumor on the scalp EYES: normal, Conjunctiva are pink and non-injected, sclera clear OROPHARYNX:no exudate, no erythema and lips, buccal mucosa, and tongue normal  NECK: supple, thyroid normal size, non-tender, without nodularity LYMPH:  no palpable lymphadenopathy in the cervical, axillary or inguinal LUNGS: clear to auscultation and percussion with normal breathing effort HEART: regular rate & rhythm and no murmurs and no lower extremity edema ABDOMEN:abdomen soft, non-tender and normal bowel sounds MUSCULOSKELETAL:no cyanosis of digits and no clubbing  NEURO: alert & oriented x 3 with fluent speech, generalized weakness EXTREMITIES: Left upper extremity lymphedema BREAST: Large necrotic  mass left chest wall (exam performed in the presence of a chaperone)  LABORATORY DATA:  I have reviewed the data as listed CMP Latest Ref Rng & Units 09/28/2017 09/21/2017 09/14/2017  Glucose 70 - 140 mg/dL 96 141(H) 82  BUN 7 - 26 mg/dL 22 32(H) 21  Creatinine 0.60 - 1.10 mg/dL 0.83 1.25(H) 0.79  Sodium 136 - 145 mmol/L 133(L) 129(L) 133(L)  Potassium 3.5 - 5.1 mmol/L 3.8 4.2 2.7(LL)  Chloride 98 - 109 mmol/L 93(L) 84(L) 83(L)  CO2 22 - 29 mmol/L 28 32(H) 37(H)  Calcium 8.4 - 10.4 mg/dL 10.0 9.7 9.0  Total Protein 6.4 - 8.3 g/dL 6.5 6.2(L) 6.5  Total Bilirubin 0.2 - 1.2 mg/dL 0.6 0.5 1.0  Alkaline Phos 40 - 150 U/L 110 108 304(H)  AST 5 - 34 U/L 33 32 87(H)  ALT 0 - 55 U/L _0 Lab Results  Component Value Date   WBC 14.9 (H) 10/04/2017   HGB 9.7 (L) 10/04/2017   HCT 29.9 (L) 10/04/2017   MCV 81.3 10/04/2017   PLT 356 10/04/2017   NEUTROABS 12.8 (H) 10/04/2017    ASSESSMENT & PLAN:  Bilateral breast cancer ([place]) Bilateral breast cancers are usually diagnosed 2015 treated with surgery, radiation, anastrozole Metastatic breast cancer diagnosed 05/28/2015 as subpectoral mass, ER 5%, PR 0%, HER-2 negative, failed Xeloda and carboplatin and gemcitabine  Current Treatment: Hospice care Pain Management: Morphine + Duragesic patch.  Goals of care: I spoke to the patient's daughter extensively I also spoke to the patient extensively that she is progressively deteriorating.  The tumor is significantly more foul-smelling and is necrotic.  I do not believe there is any further benefit to bringing her in weekly for transfusion support.  I recommended that we pursue complete hospice care and not put her through any more transfusion type treatments. Patient and her daughter are both agreeable.  We will need to cancel all the further transportation appointments. We made a recent request for hospice care.   I spent 25 minutes talking to the patient of which more than half was spent  in counseling and coordination of care.  Orders Placed This Encounter  Procedures  . Ambulatory referral to Hospice    Referral Priority:   Routine    Referral Type:   Consultation    Referral Reason:   Specialty Services Required    Requested Specialty:   Hospice Services    Number of Visits Requested:   1   The patient has a good understanding of the overall plan. she agrees with it. she will call with any problems that may develop before the next visit here.   Harriette Ohara, MD 10/04/17

## 2017-10-04 NOTE — Telephone Encounter (Signed)
Faxed pt information to [facility] including H&P, office notes, recent labs, and most recent surgical biopsy results. Sent attention to Cassandra to [phone] fax number she provided.  Cyndia Bent RN

## 2017-10-04 NOTE — Progress Notes (Signed)
Attempted to change patients dressings over breast tumor and scalp tumor. Dressings were significantly adhered to tumors and encrusted with dried tissue. Unable to remove safely without risk of increased bleeding even with soaking the current dressings. Applied Vaseline gauze dressing with a  dry dressing over both tumors. Instructed patient to leave dressings until Hospice nurse comes out to see her. [facility] was called to pick patient up. This RN will also call Hospice as well. No further questions at this time. After discussion with Dr. Lindi Adie patient has decided to use hospice services. She will not need blood on Saturday due to hemoglobin being WNL and I am canceling all future appointments. I will call [facility] and cancel future transportation as well.  Cyndia Bent RN

## 2017-10-04 NOTE — Telephone Encounter (Signed)
I called blood bank and informed them patient would not be needing blood and canceled Saturday appointment as well as all future appointments. I called [facility] and left a VM for them canceling transportation for all appointments to [facility].  Cyndia Bent RN

## 2017-10-05 ENCOUNTER — Other Ambulatory Visit: Payer: Medicare Other

## 2017-10-05 ENCOUNTER — Telehealth: Payer: Self-pay

## 2017-10-05 ENCOUNTER — Ambulatory Visit: Payer: Medicare Other | Admitting: Adult Health

## 2017-10-05 NOTE — Telephone Encounter (Signed)
Received VM from Hospice rep [place] at [phone] letting us know that they have received and are working on the referral for this patient.  Stated no other concerns, no call back needed.

## 2017-10-05 NOTE — Telephone Encounter (Signed)
Received VM from [place] from Hospice [phone]) that patient has decided that she will not proceed with Hospice Services and she wants to continue her chemo pill which would make her ineligible for Hospice.  Vito Backers will be leaving at 4 pm today but can be reached Monday.  I will route this note to Dr Lindi Adie and Edwyna Shell, social worker.

## 2017-10-08 ENCOUNTER — Telehealth: Payer: Self-pay | Admitting: Hematology and Oncology

## 2017-10-08 NOTE — Telephone Encounter (Signed)
Patient apparently met with hospice team and decided not to enroll at this time. The holdup has been her interest in continuing olaparib in spite of futility of the treatment. I discussed with the patient's daughter that we would not planning on continuing with olaparib. It is completely up to the patient and her family to decide when they are ready for hospice care.  I spoke to [place] as well.  She wants to get a hospital bed.  She understands that hospice would be able to provide all of these immunities for her end-of-life care.  However she does not seem to be interested in that. Her daughter will send me some paperwork to get her a hospital bed.  I am concerned that because of lack of dressing changes, she could get infected.

## 2017-10-12 ENCOUNTER — Other Ambulatory Visit: Payer: Medicare Other

## 2017-10-12 ENCOUNTER — Ambulatory Visit: Payer: Medicare Other | Admitting: Hematology and Oncology

## 2017-10-19 ENCOUNTER — Other Ambulatory Visit: Payer: Medicare Other

## 2017-10-19 ENCOUNTER — Ambulatory Visit: Payer: Medicare Other | Admitting: Hematology and Oncology

## 2017-10-22 ENCOUNTER — Telehealth: Payer: Self-pay

## 2017-10-22 ENCOUNTER — Other Ambulatory Visit: Payer: Self-pay | Admitting: *Deleted

## 2017-10-22 ENCOUNTER — Other Ambulatory Visit: Payer: Self-pay

## 2017-10-22 DIAGNOSIS — C50011 Malignant neoplasm of nipple and areola, right female breast: Secondary | ICD-10-CM

## 2017-10-22 DIAGNOSIS — L989 Disorder of the skin and subcutaneous tissue, unspecified: Secondary | ICD-10-CM

## 2017-10-22 DIAGNOSIS — C50919 Malignant neoplasm of unspecified site of unspecified female breast: Secondary | ICD-10-CM

## 2017-10-22 DIAGNOSIS — C7800 Secondary malignant neoplasm of unspecified lung: Secondary | ICD-10-CM

## 2017-10-22 DIAGNOSIS — C7951 Secondary malignant neoplasm of bone: Secondary | ICD-10-CM

## 2017-10-22 DIAGNOSIS — C770 Secondary and unspecified malignant neoplasm of lymph nodes of head, face and neck: Secondary | ICD-10-CM

## 2017-10-22 DIAGNOSIS — C50012 Malignant neoplasm of nipple and areola, left female breast: Principal | ICD-10-CM

## 2017-10-22 MED ORDER — MORPHINE SULFATE 30 MG PO TABS: 30.0000 mg | ORAL_TABLET | Freq: Four times a day (QID) | ORAL | 0 refills | Status: AC | PRN

## 2017-10-22 MED ORDER — FENTANYL 50 MCG/HR TD PT72: 50.0000 ug | MEDICATED_PATCH | TRANSDERMAL | 0 refills | Status: DC

## 2017-10-22 MED ORDER — MORPHINE SULFATE 30 MG PO TABS: 30.0000 mg | ORAL_TABLET | Freq: Four times a day (QID) | ORAL | 0 refills | Status: DC | PRN

## 2017-10-22 MED ORDER — LORAZEPAM 1 MG PO TABS: 1.0000 mg | ORAL_TABLET | Freq: Every day | ORAL | 0 refills | Status: AC

## 2017-10-22 MED ORDER — LORAZEPAM 1 MG PO TABS: 1.0000 mg | ORAL_TABLET | Freq: Every day | ORAL | 0 refills | Status: DC

## 2017-10-22 MED ORDER — FENTANYL 50 MCG/HR TD PT72: 50.0000 ug | MEDICATED_PATCH | TRANSDERMAL | 0 refills | Status: AC

## 2017-10-22 NOTE — Telephone Encounter (Signed)
Received VM from pt's daughter Paula Navarro regarding patient's refill for Ativan, Fentanyl patch, and Morphine.  Notified [name] NP and prescriptions were completed.  Notified [name] that the prescriptions will be waiting up front for pick up. Per [name] NP, noted multiple prescribers for the Ativan,  I reminded daughter to request refills by previously provider that prescribed medication not different prescribers.  She voiced understanding, said she forgot bottle at home and couldn't remember who prescribed last.  She also requests a referral for a hospital bed and home health as the patient is not walking much, bed bound, and they are changing her diapers in bed.  Reports a hospital bed would help and pt doesn't want hospice at this time as she wants to continue to take oral chemo med.  I let her know we will ask for a referral for home health including hospital bed. No other needs per pt's daughter at this time.

## 2017-10-22 NOTE — Telephone Encounter (Signed)
Reviewed [place] CSRS database.  Refills appropriate.  She has gotten some refills from providers outside of the cancer center previously (though not overlapping).  Will remind patient caregivers to get controlled medications from cancer center only.

## 2017-10-22 NOTE — Telephone Encounter (Signed)
Received referral for DME hospital bed and home health referral per [name] NP and pt's daughter request.  Notified [name] from [facility] and she is working on the referrals now.

## 2017-10-23 ENCOUNTER — Telehealth: Payer: Self-pay | Admitting: Hematology and Oncology

## 2017-10-23 NOTE — Telephone Encounter (Signed)
10/23/17 @ 9:00 am. Spoke with patient's daughter to inform disability form is completed for her mother. No fax number was given, and she requested a copy be mailed to her home at [address] [place], [place] [address]. Form has been mailed.

## 2017-10-24 ENCOUNTER — Telehealth: Payer: Self-pay

## 2017-10-24 MED ORDER — LIDOCAINE (ANORECTAL) 5 % EX GEL: CUTANEOUS | 1 refills | Status: AC

## 2017-10-24 NOTE — Telephone Encounter (Signed)
[facility]) called to notify Dr.Gudena of a few things regarding pt. Home health is needing additional verbal orders for the following:  Rectal pain/drainage - asking for lidocaine gel to help with discomfort DNR request from daughter  Wound and dressing changes to head, L breast, behind knee, and possible pressure ulcer. Social worker to help daughter with other resources Home health aide to assist with bathing/changing/eating.  Per [facility] nurse, pt BP lower in the 80's/50's. Pt not eating/drinking well. Pt was offered hospice but continues to refuse at this time. Pt still taking chemo medication. Discussed with Dr.Gudena and will sign DNR paper. Called [name] [facility]) to get original copy to home care nurse. Will also reach out to chaplain to find out about additional resource for spiritual support for pt and daughter. Per Dr.Gudena, sent script for lidocaine gel to [facility] in [place]. Verbal orders given for additional request for dressing changes, hh aide, sw.   No other further request at this time.

## 2017-10-26 ENCOUNTER — Other Ambulatory Visit: Payer: Medicare Other

## 2017-10-26 ENCOUNTER — Ambulatory Visit: Payer: Medicare Other | Admitting: Adult Health

## 2017-10-29 ENCOUNTER — Other Ambulatory Visit: Payer: Self-pay

## 2017-10-29 ENCOUNTER — Telehealth: Payer: Self-pay

## 2017-10-29 MED ORDER — ONDANSETRON 8 MG PO TBDP: 8.0000 mg | ORAL_TABLET | Freq: Three times a day (TID) | ORAL | 0 refills | Status: AC | PRN

## 2017-10-29 NOTE — Telephone Encounter (Signed)
Returned call from [facility] Nurse regarding patient. She stated patient was having complaints of nausea and vomiting this morning. Patient has been eating very little. She is throwing up stomach bile. Sent in a script for sublingual zofran. She also stated her tumor on her chest is draining. I suggested to continue to change dressings and reinforce them. No further questions at this time.  Cyndia Bent RN

## 2017-11-02 ENCOUNTER — Other Ambulatory Visit: Payer: Self-pay | Admitting: Radiation Oncology

## 2017-11-24 ENCOUNTER — Other Ambulatory Visit: Payer: Self-pay | Admitting: Hematology and Oncology

## 2017-11-24 DIAGNOSIS — C50111 Malignant neoplasm of central portion of right female breast: Secondary | ICD-10-CM

## 2017-11-24 DIAGNOSIS — C50112 Malignant neoplasm of central portion of left female breast: Principal | ICD-10-CM

## 2017-11-28 DEATH — deceased

## 2018-12-14 IMAGING — MR MR LUMBAR SPINE W/O CM
4 of 5 series · 14 of 48 positions shown · non-contrast
Comparison: Abdomen and pelvis CT 07/21/2017

CLINICAL DATA: Increasing back pain. History of stage IV breast
cancer.

EXAM:
MRI LUMBAR SPINE WITHOUT CONTRAST
TECHNIQUE: Multiplanar, multisequence MR imaging of the lumbar spine was
performed. No intravenous contrast was administered.

[Series 4: T2 · sagittal · 4.0mm · 0.40mm/px · 5 of 13 slices shown (1 of 3)]
[im 1/13]
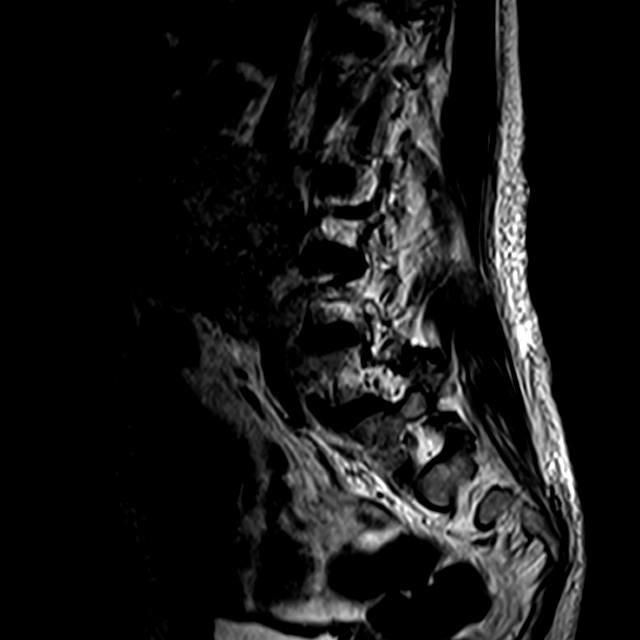
[im 3/13]
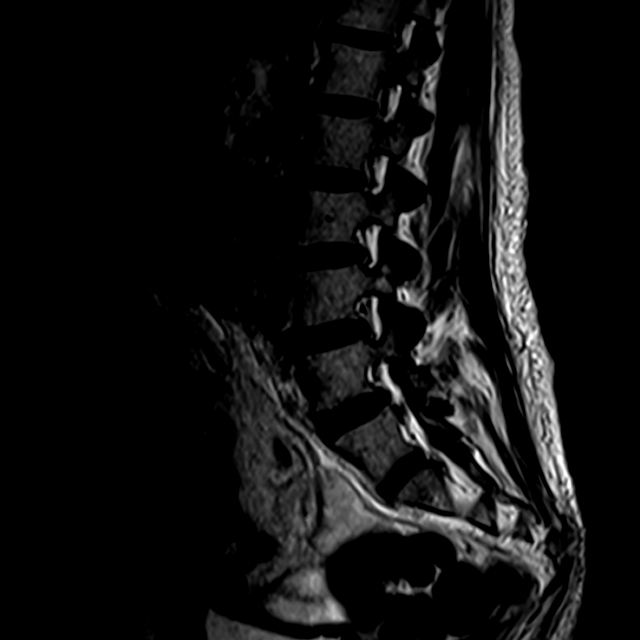
[im 5/13]
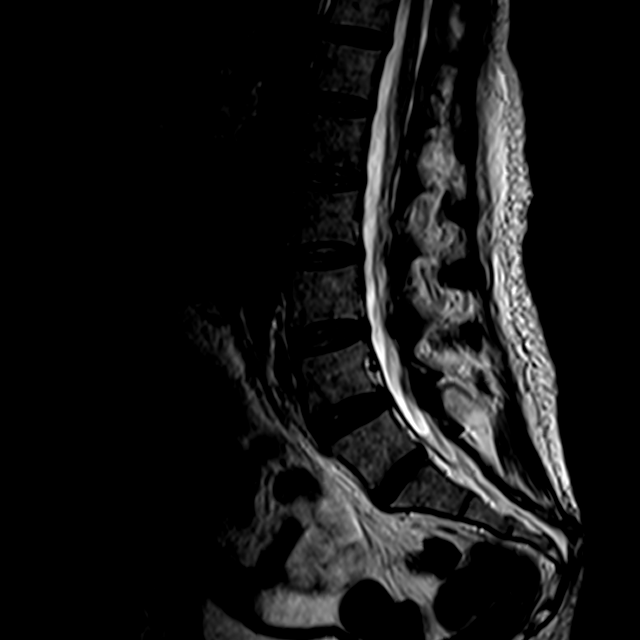
[im 8/13]
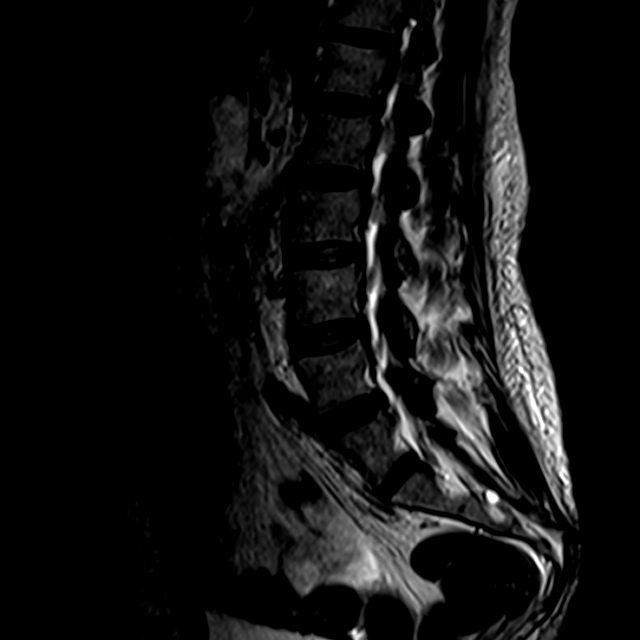
[im 13/13]
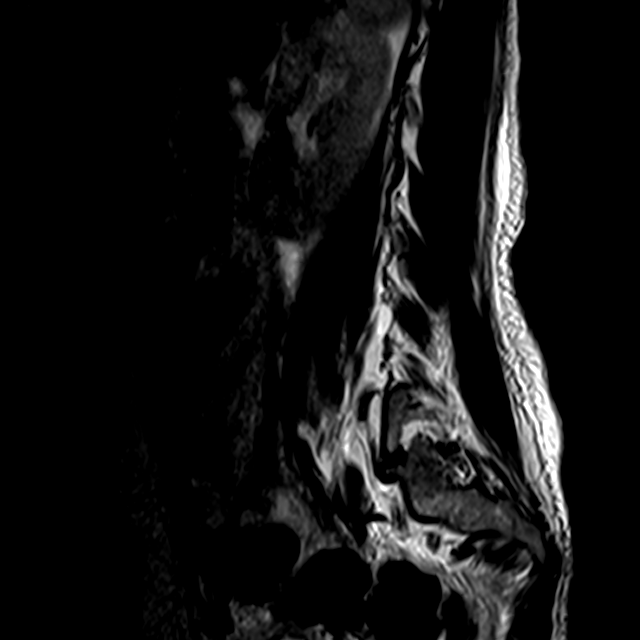

[Series 5: T1 · sagittal · 4.0mm · 0.40mm/px · 3 of 13 slices shown]
[im 1/13]
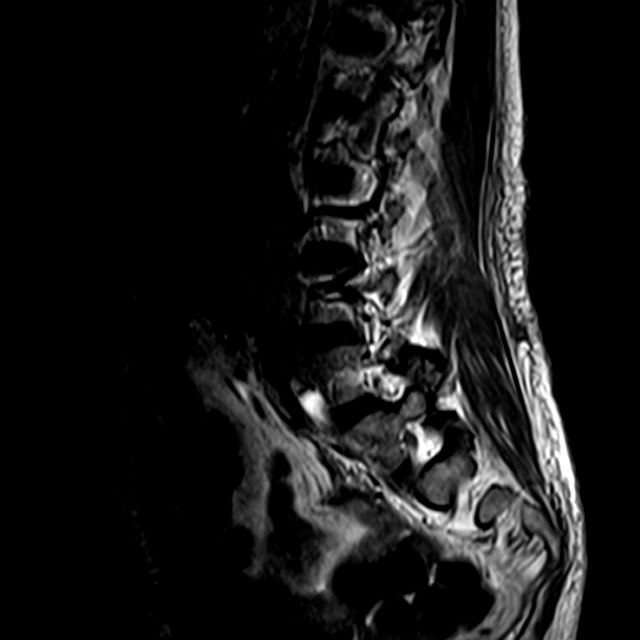
[im 7/13]
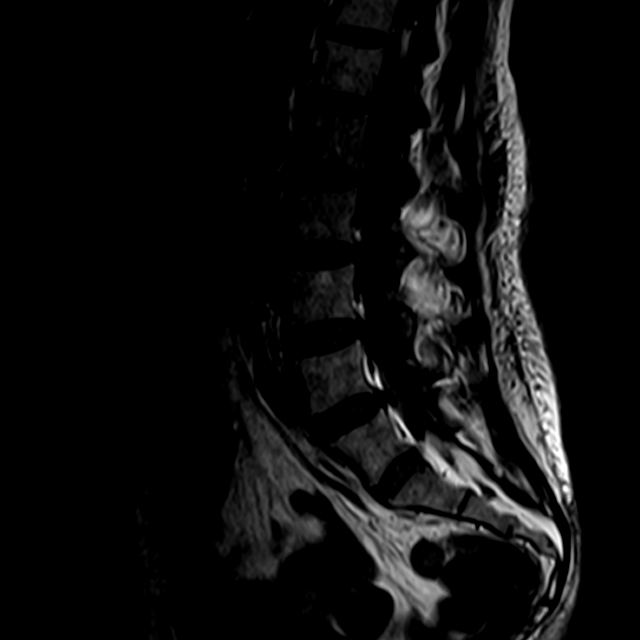
[im 13/13]
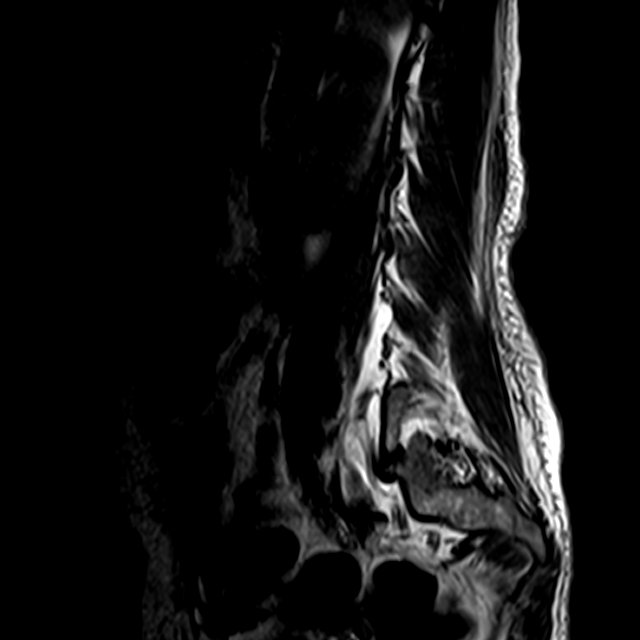

[Series 6: T2 · sagittal · 4.0mm · 0.40mm/px · 3 of 16 slices shown (2 of 3)]
[im 3/16]
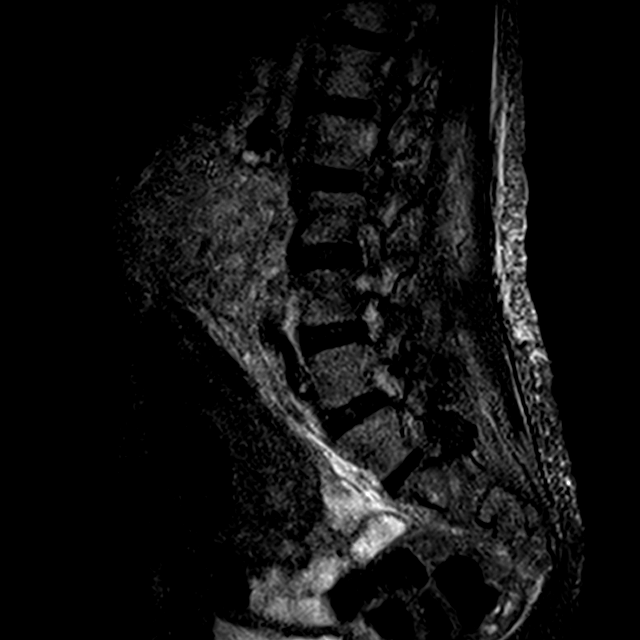
[im 8/16]
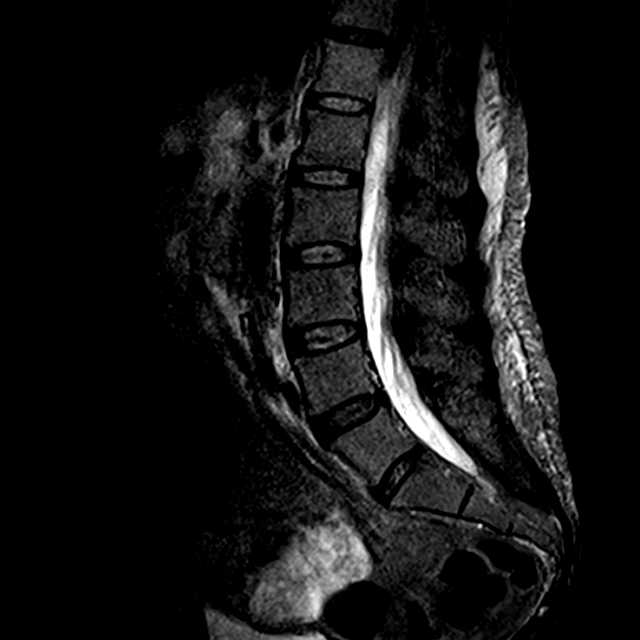
[im 13/16]
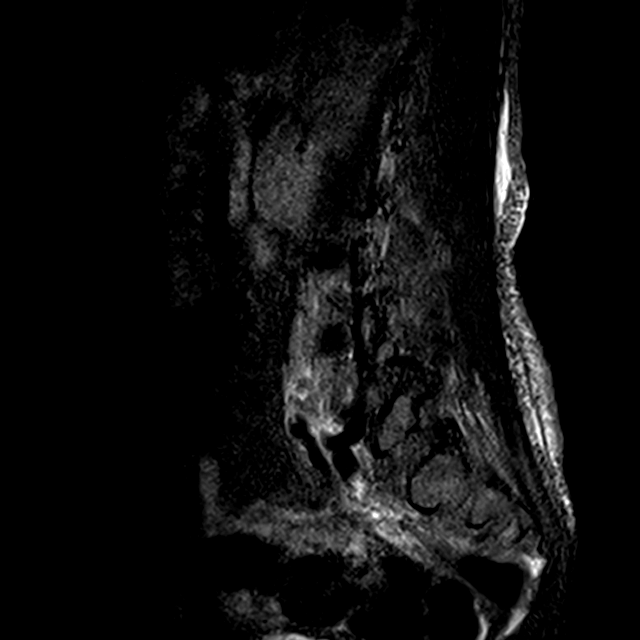

[Series 7: T2 · axial · 4.0mm · 0.33mm/px · z∈[-119,+20]mm · 3 of 37 slices shown (3 of 3)]
[im 6/37]
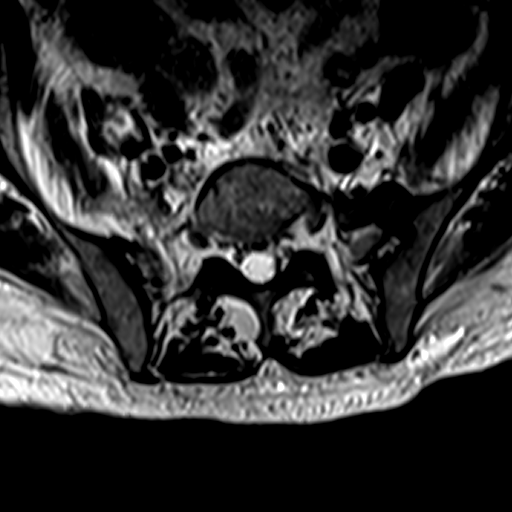
[im 19/37]
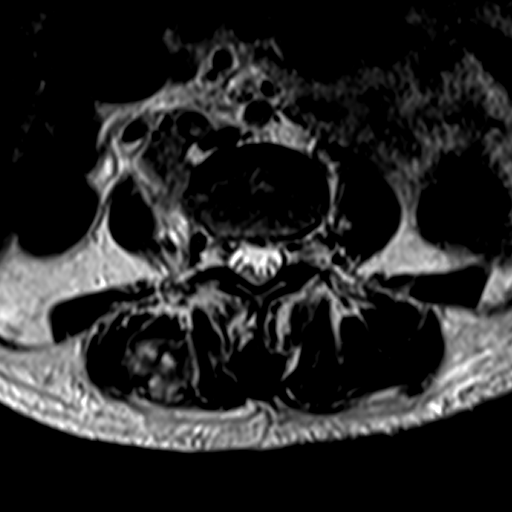
[im 31/37]
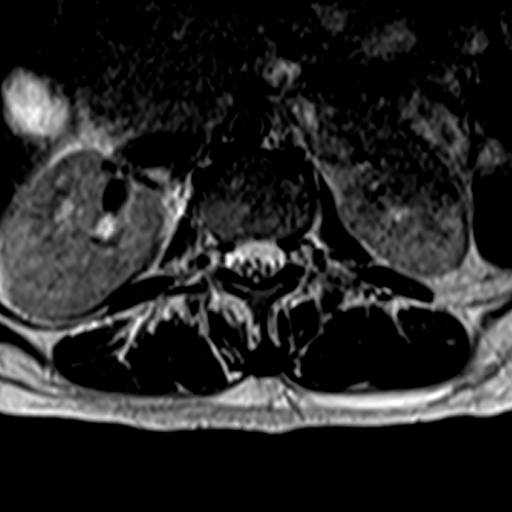

[14 of 48 positions shown; findings below may reference images not displayed]

FINDINGS: Segmentation:  Standard.

Alignment:  Physiologic.

Vertebrae: No fracture, evidence of discitis, or aggressive bone
lesion. Small signal abnormality in the L1 inferior endplate is
attributed to Schmorl's node.

Conus medullaris and cauda equina: Conus extends to the L1 level.
Conus and cauda equina appear normal.

Paraspinal and other soft tissues: Known retroperitoneal adenopathy
with bulky mass right para aortic, at least 8.5 cm in size. There is
known intra and extrahepatic bile duct dilatation. Necrotic
lymphadenopathy in the right iliac chain indistinguishable from the
right psoas and broadly along the right upper lumbosacral plexus.
Adenopathy visibly contacts the right extraforaminal L4 nerve root.
Necrotic adenopathy is seen to invade the right common iliac vein
without occlusion. Soft tissue metastasis is seen in the right
intrinsic back muscles, 2.6 cm at the level of L3-4.

Disc levels:

Lower lumbar facet arthropathy.  No degenerative impingement.
IMPRESSION: 1. Necrotic right iliac chain lymphadenopathy involves the right
lumbosacral plexus. This mass invades the right common iliac vein.
2. 2.6 cm metastasis in the right intrinsic back muscles at the L3-4
level.
3. Known bulky periaortic mass compressing the IVC.
4. Negative for lumbar spine metastasis.
